# Patient Record
Sex: Male | Born: 1965 | Race: Black or African American | Hispanic: No | Marital: Single | Smoking: Former smoker
Health system: Southern US, Community
[De-identification: ages and names within clinical notes are randomized; demographics above are authoritative.]

## PROBLEM LIST (undated history)

## (undated) DIAGNOSIS — G4733 Obstructive sleep apnea (adult) (pediatric): Secondary | ICD-10-CM

## (undated) DIAGNOSIS — N189 Chronic kidney disease, unspecified: Secondary | ICD-10-CM

## (undated) DIAGNOSIS — E119 Type 2 diabetes mellitus without complications: Secondary | ICD-10-CM

## (undated) DIAGNOSIS — I340 Nonrheumatic mitral (valve) insufficiency: Secondary | ICD-10-CM

## (undated) DIAGNOSIS — I469 Cardiac arrest, cause unspecified: Secondary | ICD-10-CM

## (undated) DIAGNOSIS — I10 Essential (primary) hypertension: Secondary | ICD-10-CM

## (undated) DIAGNOSIS — K635 Polyp of colon: Secondary | ICD-10-CM

## (undated) DIAGNOSIS — I517 Cardiomegaly: Secondary | ICD-10-CM

## (undated) DIAGNOSIS — Z8709 Personal history of other diseases of the respiratory system: Secondary | ICD-10-CM

## (undated) DIAGNOSIS — E1129 Type 2 diabetes mellitus with other diabetic kidney complication: Secondary | ICD-10-CM

## (undated) DIAGNOSIS — E785 Hyperlipidemia, unspecified: Secondary | ICD-10-CM

## (undated) DIAGNOSIS — K5730 Diverticulosis of large intestine without perforation or abscess without bleeding: Secondary | ICD-10-CM

## (undated) DIAGNOSIS — Z973 Presence of spectacles and contact lenses: Secondary | ICD-10-CM

## (undated) DIAGNOSIS — T7840XA Allergy, unspecified, initial encounter: Secondary | ICD-10-CM

## (undated) DIAGNOSIS — R092 Respiratory arrest: Secondary | ICD-10-CM

## (undated) DIAGNOSIS — R778 Other specified abnormalities of plasma proteins: Secondary | ICD-10-CM

## (undated) DIAGNOSIS — R011 Cardiac murmur, unspecified: Secondary | ICD-10-CM

## (undated) DIAGNOSIS — I219 Acute myocardial infarction, unspecified: Secondary | ICD-10-CM

## (undated) DIAGNOSIS — G4730 Sleep apnea, unspecified: Secondary | ICD-10-CM

## (undated) DIAGNOSIS — I2510 Atherosclerotic heart disease of native coronary artery without angina pectoris: Secondary | ICD-10-CM

## (undated) DIAGNOSIS — Z992 Dependence on renal dialysis: Secondary | ICD-10-CM

## (undated) DIAGNOSIS — Z972 Presence of dental prosthetic device (complete) (partial): Secondary | ICD-10-CM

## (undated) HISTORY — PX: WISDOM TOOTH EXTRACTION: SHX21

## (undated) HISTORY — DX: Atherosclerotic heart disease of native coronary artery without angina pectoris: I25.10

## (undated) HISTORY — DX: Dependence on renal dialysis: Z99.2

## (undated) HISTORY — DX: Essential (primary) hypertension: I10

## (undated) HISTORY — PX: NECK SURGERY: SHX720

## (undated) HISTORY — DX: Type 2 diabetes mellitus without complications: E11.9

## (undated) HISTORY — DX: Chronic kidney disease, unspecified: N18.9

## (undated) HISTORY — DX: Obstructive sleep apnea (adult) (pediatric): G47.33

## (undated) HISTORY — DX: Allergy, unspecified, initial encounter: T78.40XA

## (undated) HISTORY — DX: Sleep apnea, unspecified: G47.30

## (undated) HISTORY — DX: Acute myocardial infarction, unspecified: I21.9

## (undated) HISTORY — DX: Hyperlipidemia, unspecified: E78.5

---
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
## 1999-04-13 ENCOUNTER — Emergency Department (HOSPITAL_COMMUNITY): Admission: EM | Admit: 1999-04-13 | Discharge: 1999-04-13 | Payer: Self-pay | Admitting: Emergency Medicine

## 2009-08-15 DIAGNOSIS — I2510 Atherosclerotic heart disease of native coronary artery without angina pectoris: Secondary | ICD-10-CM

## 2009-08-15 HISTORY — DX: Atherosclerotic heart disease of native coronary artery without angina pectoris: I25.10

## 2009-09-13 ENCOUNTER — Inpatient Hospital Stay (HOSPITAL_COMMUNITY): Admission: AC | Admit: 2009-09-13 | Discharge: 2009-09-18 | Payer: Self-pay

## 2009-09-13 ENCOUNTER — Ambulatory Visit: Payer: Self-pay | Admitting: Internal Medicine

## 2009-09-13 ENCOUNTER — Ambulatory Visit: Payer: Self-pay | Admitting: Cardiology

## 2009-09-14 ENCOUNTER — Encounter: Payer: Self-pay | Admitting: Cardiology

## 2009-09-15 ENCOUNTER — Encounter: Payer: Self-pay | Admitting: Cardiovascular Disease

## 2009-10-18 ENCOUNTER — Telehealth: Payer: Self-pay | Admitting: Internal Medicine

## 2009-10-27 ENCOUNTER — Telehealth: Payer: Self-pay | Admitting: Internal Medicine

## 2009-11-01 DIAGNOSIS — I219 Acute myocardial infarction, unspecified: Secondary | ICD-10-CM | POA: Insufficient documentation

## 2009-11-01 DIAGNOSIS — I469 Cardiac arrest, cause unspecified: Secondary | ICD-10-CM | POA: Insufficient documentation

## 2009-11-01 DIAGNOSIS — I10 Essential (primary) hypertension: Secondary | ICD-10-CM | POA: Insufficient documentation

## 2009-11-01 DIAGNOSIS — I2510 Atherosclerotic heart disease of native coronary artery without angina pectoris: Secondary | ICD-10-CM

## 2009-11-01 HISTORY — DX: Cardiac arrest, cause unspecified: I46.9

## 2009-11-01 HISTORY — DX: Essential (primary) hypertension: I10

## 2009-11-01 HISTORY — DX: Atherosclerotic heart disease of native coronary artery without angina pectoris: I25.10

## 2009-11-02 ENCOUNTER — Ambulatory Visit: Payer: Self-pay | Admitting: Internal Medicine

## 2009-11-02 ENCOUNTER — Encounter: Payer: Self-pay | Admitting: Cardiology

## 2010-02-20 ENCOUNTER — Telehealth (INDEPENDENT_AMBULATORY_CARE_PROVIDER_SITE_OTHER): Payer: Self-pay | Admitting: *Deleted

## 2010-06-17 DIAGNOSIS — G4733 Obstructive sleep apnea (adult) (pediatric): Secondary | ICD-10-CM

## 2010-06-17 HISTORY — DX: Obstructive sleep apnea (adult) (pediatric): G47.33

## 2010-07-17 NOTE — Assessment & Plan Note (Signed)
Summary: eph per Troy Wells call/lg   Visit Type:  Initial Consult Primary Provider:  Orlean Patten, MD   History of Present Illness: [name] returns today for followup.  He is a pleasant 45 yo man with a NSTEMI which was complicated by VF arrest for which he was successfully rescucitated.  The patient had done well since discharge except that his blood pressure remains elevated.  He wants to return to work.  He works as a Games developer.  Current Medications (verified): 1)  Plavix 75 Mg Tabs (Clopidogrel Bisulfate) .... Take One Tablet By Mouth Daily 2)  Aspirin 81 Mg Tbec (Aspirin) .... Take One Tablet By Mouth Daily 3)  Hydralazine Hcl 10 Mg Tabs (Hydralazine Hcl) .... Take One Tablet By Mouth Two Times A Day 4)  Metoprolol Succinate 100 Mg Xr24h-Tab (Metoprolol Succinate) .... Take One Tablet By Mouth Daily 5)  Lovastatin 20 Mg Tabs (Lovastatin) .... Take One Tablet By Mouth Daily At Bedtime 6)  Lisinopril 40 Mg Tabs (Lisinopril) .... Take One Tablet By Mouth Daily 7)  Glipizide 5 Mg Xr24h-Tab (Glipizide) .... Take One Tablet By Mouth Once Daily. 8)  Colcrys 0.6 Mg Tabs (Colchicine) .... As Needed 9)  Allopurinol 100 Mg Tabs (Allopurinol) .... 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    Vital Signs:  Patient profile:   44 year old male Height:      72 inches Weight:      227 pounds BMI:     30.90 Pulse rate:   75 / minute Pulse rhythm:   regular BP sitting:   172 / 102  (left arm)  Vitals Entered By: Margaretmary Bayley CMA (Nov 02, 2009 9:24  AM)  Physical Exam  General:  Well developed, well nourished, in no acute distress.  HEENT: normal Neck: supple. No JVD. Carotids 2+ bilaterally no bruits Cor: RRR no rubs. A soft  S4 is present. Lungs: CTA Ab: soft, nontender. nondistended. No HSM. Good bowel sounds Ext: warm. no cyanosis, clubbing or edema Neuro: alert and oriented. Grossly nonfocal. affect pleasant    EKG  Procedure date:  11/02/2009  Findings:      Normal sinus rhythm with rate of:  75.  Impression & Recommendations:  Problem # 1:  MI (ICD-410.90) He has no additional anginal symptoms.  He will continue his current meds. His updated medication list for this problem includes:    Plavix 75 Mg Tabs (Clopidogrel bisulfate) .Marland Kitchen... Take one tablet by mouth daily    Aspirin 81 Mg Tbec (Aspirin) .Marland Kitchen... Take one tablet by mouth daily    Metoprolol Succinate 100 Mg Xr24h-tab (Metoprolol succinate) .Marland Kitchen... Take one tablet by mouth daily    Lisinopril 40 Mg Tabs (Lisinopril) .Marland Kitchen... Take one tablet by mouth daily  Orders: EKG w/ Interpretation (93000) EKG w/ Interpretation (93000)  Problem # 2:  HYPERTENSION (ICD-401.9) His blood pressure is not well controlled.  He admits to sodium indiscretion.  I have asked him to walk/perform aerobic exercise and reduce his sodium intake.  He is going to have an u/s of his kidneys.   His updated medication list for this problem includes:  Aspirin 81 Mg Tbec (Aspirin) .Marland Kitchen... Take one tablet by mouth daily    Hydralazine Hcl 10 Mg Tabs (Hydralazine hcl) .Marland Kitchen... Take one tablet by mouth two times a day    Metoprolol Succinate 100 Mg Xr24h-tab (Metoprolol succinate) .Marland Kitchen... Take one tablet by mouth daily    Lisinopril 40 Mg Tabs (Lisinopril) .Marland Kitchen... Take one tablet by mouth daily  Orders: EKG w/ Interpretation (93000)  Patient Instructions: 1)  Your physician recommends that you schedule a follow-up appointment in: 4 months with Dr. Lovena Le 2)  Your physician recommends that you continue  on your current medications as directed. Please refer to the Current Medication list given to you today.

## 2010-07-17 NOTE — Progress Notes (Signed)
  ROI mailed out to Pt. Troy Wells  February 20, 2010 9:03 AM     Appended Document:  Recieved ROI back Via Fax, Faxed records over to [facility] @ [phone]

## 2010-07-17 NOTE — Progress Notes (Signed)
Summary:  Need samples of Plavix  Phone Note Call from Patient Call back at Home Phone [phone]   Caller: Patient Summary of Call: Pt calling need samples of Plavix Initial call taken by: Delsa Sale,  Oct 18, 2009 11:34 AM  Follow-up for Phone Call        Contacted pt to let him know that his samples willbe waiting for him at the front desk. Follow-up by: Margaretmary Bayley CMA,  Oct 18, 2009 4:19 PM

## 2010-07-17 NOTE — Letter (Signed)
Summary: Return To Work  [place] [place]  [address] [place]   [place], [place] [address]   Phone: [phone]  Fax: [phone]    11/02/2009  TO: Dorathy Daft IT MAY CONCERN   RE: Troy Wells [address] [place]   The above named individual is under my medical care and may return to work.  He may not drive at this time.  If you have any further questions or need additional information, please call.     Sincerely,    Joelyn Oms RN/ Dr. Lovena Le

## 2010-07-17 NOTE — Progress Notes (Signed)
Summary: pt need plavix samples  Phone Note Refill Request Call back at Home Phone [phone] Message from:  Patient  Refills Requested: Medication #1:  plavix samples Initial call taken by: Shelda Pal,  Oct 27, 2009 9:45 AM  Follow-up for Phone Call        [facility] letting patien know that there are samples for him at the front desk. 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 Follow-up by: Margaretmary Bayley CMA,  Oct 27, 2009 3:39 PM

## 2010-09-05 LAB — CBC
HCT: 31.1 % — ABNORMAL LOW (ref 39.0–52.0)
HCT: 32 % — ABNORMAL LOW (ref 39.0–52.0)
HCT: 33.2 % — ABNORMAL LOW (ref 39.0–52.0)
Hemoglobin: 10.6 g/dL — ABNORMAL LOW (ref 13.0–17.0)
Hemoglobin: 10.9 g/dL — ABNORMAL LOW (ref 13.0–17.0)
Hemoglobin: 11 g/dL — ABNORMAL LOW (ref 13.0–17.0)
MCHC: 33.2 g/dL (ref 30.0–36.0)
MCHC: 33.9 g/dL (ref 30.0–36.0)
MCHC: 34.1 g/dL (ref 30.0–36.0)
MCV: 84.8 fL (ref 78.0–100.0)
MCV: 85.5 fL (ref 78.0–100.0)
MCV: 85.9 fL (ref 78.0–100.0)
Platelets: 257 10*3/uL (ref 150–400)
Platelets: 277 10*3/uL (ref 150–400)
Platelets: 312 K/uL (ref 150–400)
RBC: 3.62 MIL/uL — ABNORMAL LOW (ref 4.22–5.81)
RBC: 3.77 MIL/uL — ABNORMAL LOW (ref 4.22–5.81)
RBC: 3.89 MIL/uL — ABNORMAL LOW (ref 4.22–5.81)
RDW: 13.2 % (ref 11.5–15.5)
RDW: 13.4 % (ref 11.5–15.5)
RDW: 13.5 % (ref 11.5–15.5)
WBC: 12.6 K/uL — ABNORMAL HIGH (ref 4.0–10.5)
WBC: 12.7 10*3/uL — ABNORMAL HIGH (ref 4.0–10.5)
WBC: 13.5 10*3/uL — ABNORMAL HIGH (ref 4.0–10.5)

## 2010-09-05 LAB — URINALYSIS, ROUTINE W REFLEX MICROSCOPIC
Bilirubin Urine: NEGATIVE
Glucose, UA: NEGATIVE mg/dL
Ketones, ur: NEGATIVE mg/dL
Nitrite: NEGATIVE
Protein, ur: 100 mg/dL — AB
Specific Gravity, Urine: 1.016 (ref 1.005–1.030)
Urobilinogen, UA: 1 mg/dL (ref 0.0–1.0)
pH: 5.5 (ref 5.0–8.0)

## 2010-09-05 LAB — GLUCOSE, CAPILLARY
Glucose-Capillary: 104 mg/dL — ABNORMAL HIGH (ref 70–99)
Glucose-Capillary: 106 mg/dL — ABNORMAL HIGH (ref 70–99)
Glucose-Capillary: 116 mg/dL — ABNORMAL HIGH (ref 70–99)
Glucose-Capillary: 117 mg/dL — ABNORMAL HIGH (ref 70–99)
Glucose-Capillary: 124 mg/dL — ABNORMAL HIGH (ref 70–99)
Glucose-Capillary: 126 mg/dL — ABNORMAL HIGH (ref 70–99)
Glucose-Capillary: 136 mg/dL — ABNORMAL HIGH (ref 70–99)
Glucose-Capillary: 144 mg/dL — ABNORMAL HIGH (ref 70–99)
Glucose-Capillary: 177 mg/dL — ABNORMAL HIGH (ref 70–99)

## 2010-09-05 LAB — BASIC METABOLIC PANEL
BUN: 25 mg/dL — ABNORMAL HIGH (ref 6–23)
BUN: 28 mg/dL — ABNORMAL HIGH (ref 6–23)
CO2: 24 mEq/L (ref 19–32)
CO2: 25 mEq/L (ref 19–32)
Calcium: 8.8 mg/dL (ref 8.4–10.5)
Calcium: 9.2 mg/dL (ref 8.4–10.5)
Chloride: 102 mEq/L (ref 96–112)
Chloride: 103 mEq/L (ref 96–112)
Creatinine, Ser: 1.87 mg/dL — ABNORMAL HIGH (ref 0.4–1.5)
Creatinine, Ser: 1.96 mg/dL — ABNORMAL HIGH (ref 0.4–1.5)
GFR calc Af Amer: 45 mL/min — ABNORMAL LOW (ref >60)
GFR calc Af Amer: 48 mL/min — ABNORMAL LOW (ref >60)
GFR calc non Af Amer: 37 mL/min — ABNORMAL LOW (ref >60)
GFR calc non Af Amer: 39 mL/min — ABNORMAL LOW (ref >60)
Glucose, Bld: 112 mg/dL — ABNORMAL HIGH (ref 70–99)
Glucose, Bld: 113 mg/dL — ABNORMAL HIGH (ref 70–99)
Potassium: 4 mEq/L (ref 3.5–5.1)
Potassium: 4.4 mEq/L (ref 3.5–5.1)
Sodium: 136 mEq/L (ref 135–145)
Sodium: 137 mEq/L (ref 135–145)

## 2010-09-05 LAB — BASIC METABOLIC PANEL WITH GFR
BUN: 21 mg/dL (ref 6–23)
BUN: 21 mg/dL (ref 6–23)
CO2: 24 meq/L (ref 19–32)
CO2: 24 meq/L (ref 19–32)
Calcium: 8.7 mg/dL (ref 8.4–10.5)
Calcium: 9 mg/dL (ref 8.4–10.5)
Chloride: 104 meq/L (ref 96–112)
Chloride: 104 meq/L (ref 96–112)
Creatinine, Ser: 1.83 mg/dL — ABNORMAL HIGH (ref 0.4–1.5)
Creatinine, Ser: 1.95 mg/dL — ABNORMAL HIGH (ref 0.4–1.5)
GFR calc non Af Amer: 38 mL/min — ABNORMAL LOW
GFR calc non Af Amer: 40 mL/min — ABNORMAL LOW
Glucose, Bld: 110 mg/dL — ABNORMAL HIGH (ref 70–99)
Glucose, Bld: 97 mg/dL (ref 70–99)
Potassium: 3.6 meq/L (ref 3.5–5.1)
Potassium: 3.8 meq/L (ref 3.5–5.1)
Sodium: 134 meq/L — ABNORMAL LOW (ref 135–145)
Sodium: 137 meq/L (ref 135–145)

## 2010-09-05 LAB — URINE MICROSCOPIC-ADD ON

## 2010-09-05 LAB — URIC ACID: Uric Acid, Serum: 9.1 mg/dL — ABNORMAL HIGH (ref 4.0–7.8)

## 2010-09-05 LAB — MRSA PCR SCREENING: MRSA by PCR: NEGATIVE

## 2010-09-05 LAB — HEPARIN LEVEL (UNFRACTIONATED)

## 2010-09-10 LAB — GLUCOSE, CAPILLARY
Glucose-Capillary: 123 mg/dL — ABNORMAL HIGH (ref 70–99)
Glucose-Capillary: 130 mg/dL — ABNORMAL HIGH (ref 70–99)
Glucose-Capillary: 143 mg/dL — ABNORMAL HIGH (ref 70–99)
Glucose-Capillary: 144 mg/dL — ABNORMAL HIGH (ref 70–99)
Glucose-Capillary: 152 mg/dL — ABNORMAL HIGH (ref 70–99)
Glucose-Capillary: 160 mg/dL — ABNORMAL HIGH (ref 70–99)
Glucose-Capillary: 191 mg/dL — ABNORMAL HIGH (ref 70–99)

## 2010-09-10 LAB — CBC
HCT: 34.9 % — ABNORMAL LOW (ref 39.0–52.0)
HCT: 38.1 % — ABNORMAL LOW (ref 39.0–52.0)
Hemoglobin: 11.7 g/dL — ABNORMAL LOW (ref 13.0–17.0)
Hemoglobin: 12.7 g/dL — ABNORMAL LOW (ref 13.0–17.0)
MCHC: 33.4 g/dL (ref 30.0–36.0)
MCHC: 33.5 g/dL (ref 30.0–36.0)
MCV: 85.7 fL (ref 78.0–100.0)
MCV: 86.7 fL (ref 78.0–100.0)
Platelets: 288 10*3/uL (ref 150–400)
Platelets: 327 10*3/uL (ref 150–400)
RBC: 4.03 MIL/uL — ABNORMAL LOW (ref 4.22–5.81)
RBC: 4.44 MIL/uL (ref 4.22–5.81)
RDW: 13.3 % (ref 11.5–15.5)
RDW: 13.5 % (ref 11.5–15.5)
WBC: 11.7 10*3/uL — ABNORMAL HIGH (ref 4.0–10.5)
WBC: 12.2 10*3/uL — ABNORMAL HIGH (ref 4.0–10.5)

## 2010-09-10 LAB — COMPREHENSIVE METABOLIC PANEL
ALT: 285 U/L — ABNORMAL HIGH (ref 0–53)
ALT: 334 U/L — ABNORMAL HIGH (ref 0–53)
ALT: 359 U/L — ABNORMAL HIGH (ref 0–53)
AST: 200 U/L — ABNORMAL HIGH (ref 0–37)
AST: 251 U/L — ABNORMAL HIGH (ref 0–37)
AST: 274 U/L — ABNORMAL HIGH (ref 0–37)
Albumin: 3.5 g/dL (ref 3.5–5.2)
Albumin: 3.9 g/dL (ref 3.5–5.2)
Albumin: 3.9 g/dL (ref 3.5–5.2)
Alkaline Phosphatase: 62 U/L (ref 39–117)
Alkaline Phosphatase: 67 U/L (ref 39–117)
Alkaline Phosphatase: 69 U/L (ref 39–117)
BUN: 28 mg/dL — ABNORMAL HIGH (ref 6–23)
BUN: 29 mg/dL — ABNORMAL HIGH (ref 6–23)
BUN: 29 mg/dL — ABNORMAL HIGH (ref 6–23)
CO2: 19 mEq/L (ref 19–32)
CO2: 20 mEq/L (ref 19–32)
CO2: 24 mEq/L (ref 19–32)
Calcium: 8.6 mg/dL (ref 8.4–10.5)
Calcium: 8.7 mg/dL (ref 8.4–10.5)
Calcium: 9 mg/dL (ref 8.4–10.5)
Chloride: 102 mEq/L (ref 96–112)
Chloride: 103 mEq/L (ref 96–112)
Chloride: 108 mEq/L (ref 96–112)
Creatinine, Ser: 1.8 mg/dL — ABNORMAL HIGH (ref 0.4–1.5)
Creatinine, Ser: 1.96 mg/dL — ABNORMAL HIGH (ref 0.4–1.5)
Creatinine, Ser: 2.08 mg/dL — ABNORMAL HIGH (ref 0.4–1.5)
GFR calc Af Amer: 42 mL/min — ABNORMAL LOW (ref >60)
GFR calc Af Amer: 45 mL/min — ABNORMAL LOW (ref >60)
GFR calc Af Amer: 50 mL/min — ABNORMAL LOW (ref >60)
GFR calc non Af Amer: 35 mL/min — ABNORMAL LOW (ref >60)
GFR calc non Af Amer: 37 mL/min — ABNORMAL LOW (ref >60)
GFR calc non Af Amer: 41 mL/min — ABNORMAL LOW (ref >60)
Glucose, Bld: 106 mg/dL — ABNORMAL HIGH (ref 70–99)
Glucose, Bld: 129 mg/dL — ABNORMAL HIGH (ref 70–99)
Glucose, Bld: 206 mg/dL — ABNORMAL HIGH (ref 70–99)
Potassium: 3.6 mEq/L (ref 3.5–5.1)
Potassium: 3.6 mEq/L (ref 3.5–5.1)
Potassium: 3.9 mEq/L (ref 3.5–5.1)
Sodium: 135 mEq/L (ref 135–145)
Sodium: 136 mEq/L (ref 135–145)
Sodium: 138 mEq/L (ref 135–145)
Total Bilirubin: 0.5 mg/dL (ref 0.3–1.2)
Total Bilirubin: 0.6 mg/dL (ref 0.3–1.2)
Total Bilirubin: 0.7 mg/dL (ref 0.3–1.2)
Total Protein: 6.5 g/dL (ref 6.0–8.3)
Total Protein: 7.1 g/dL (ref 6.0–8.3)
Total Protein: 7.3 g/dL (ref 6.0–8.3)

## 2010-09-10 LAB — POCT I-STAT, CHEM 8
BUN: 32 mg/dL — ABNORMAL HIGH (ref 6–23)
Calcium, Ion: 1.02 mmol/L — ABNORMAL LOW (ref 1.12–1.32)
Chloride: 109 mEq/L (ref 96–112)
Creatinine, Ser: 2.1 mg/dL — ABNORMAL HIGH (ref 0.4–1.5)
Glucose, Bld: 203 mg/dL — ABNORMAL HIGH (ref 70–99)
HCT: 41 % (ref 39.0–52.0)
Hemoglobin: 13.9 g/dL (ref 13.0–17.0)
Potassium: 4.2 meq/L (ref 3.5–5.1)
Sodium: 135 mEq/L (ref 135–145)
TCO2: 18 mmol/L (ref 0–100)

## 2010-09-10 LAB — CARDIAC PANEL(CRET KIN+CKTOT+MB+TROPI)
CK, MB: 105.4 ng/mL (ref 0.3–4.0)
CK, MB: 213.2 ng/mL (ref 0.3–4.0)
Relative Index: 7.1 — ABNORMAL HIGH (ref 0.0–2.5)
Relative Index: 9.4 — ABNORMAL HIGH (ref 0.0–2.5)
Total CK: 1489 U/L — ABNORMAL HIGH (ref 7–232)
Total CK: 2271 U/L — ABNORMAL HIGH (ref 7–232)
Troponin I: 18.57 ng/mL (ref 0.00–0.06)
Troponin I: 5.76 ng/mL (ref 0.00–0.06)

## 2010-09-10 LAB — GLYCOHEMOGLOBIN, TOTAL: Hemoglobin-A1c: 8.5 % — ABNORMAL HIGH (ref 5.4–7.4)

## 2010-09-10 LAB — DRUGS OF ABUSE SCREEN W/O ALC, ROUTINE URINE
Amphetamine Screen, Ur: NEGATIVE
Barbiturate Quant, Ur: NEGATIVE
Benzodiazepines.: POSITIVE — AB
Cocaine Metabolites: NEGATIVE
Creatinine,U: 102.9 mg/dL
Marijuana Metabolite: NEGATIVE
Methadone: NEGATIVE
Opiate Screen, Urine: NEGATIVE
Phencyclidine (PCP): NEGATIVE
Propoxyphene: NEGATIVE

## 2010-09-10 LAB — TYPE AND SCREEN
ABO/RH(D): O POS
Antibody Screen: NEGATIVE

## 2010-09-10 LAB — POCT CARDIAC MARKERS
CKMB, poc: 39.6 ng/mL (ref 1.0–8.0)
Myoglobin, poc: >500 ng/mL (ref 12–200)
Troponin i, poc: 0.8 ng/mL (ref 0.00–0.09)

## 2010-09-10 LAB — BENZODIAZEPINE, QUANTITATIVE, URINE
Alprazolam (GC/LC/MS), ur confirm: NEGATIVE NG/ML
Nordiazepam GC/MS Conf: NEGATIVE NG/ML
Oxazepam GC/MS Conf: NEGATIVE NG/ML
Temazepam GC/MS Conf: NEGATIVE NG/ML

## 2010-09-10 LAB — POCT I-STAT 3, ART BLOOD GAS (G3+)
Bicarbonate: 24.5 mEq/L — ABNORMAL HIGH (ref 20.0–24.0)
Bicarbonate: 26 mEq/L — ABNORMAL HIGH (ref 20.0–24.0)
O2 Saturation: 100 %
O2 Saturation: 72 %
Patient temperature: 97.5
Patient temperature: 97.5
TCO2: 26 mmol/L (ref 0–100)
TCO2: 27 mmol/L (ref 0–100)
pCO2 arterial: 36.7 mmHg (ref 35.0–45.0)
pCO2 arterial: 45.8 mmHg — ABNORMAL HIGH (ref 35.0–45.0)
pH, Arterial: 7.36 (ref 7.350–7.450)
pH, Arterial: 7.43 (ref 7.350–7.450)
pO2, Arterial: 335 mmHg — ABNORMAL HIGH (ref 80.0–100.0)
pO2, Arterial: 39 mmHg — CL (ref 80.0–100.0)

## 2010-09-10 LAB — HEPARIN LEVEL (UNFRACTIONATED)
Heparin Unfractionated: 0.21 IU/mL — ABNORMAL LOW (ref 0.30–0.70)
Heparin Unfractionated: 0.25 IU/mL — ABNORMAL LOW (ref 0.30–0.70)

## 2010-09-10 LAB — CK TOTAL AND CKMB (NOT AT ARMC)
CK, MB: 49.9 ng/mL (ref 0.3–4.0)
Relative Index: 5.3 — ABNORMAL HIGH (ref 0.0–2.5)
Total CK: 935 U/L — ABNORMAL HIGH (ref 7–232)

## 2010-09-10 LAB — MRSA PCR SCREENING: MRSA by PCR: NEGATIVE

## 2010-09-10 LAB — APTT: aPTT: 25 seconds (ref 24–37)

## 2010-09-10 LAB — MAGNESIUM: Magnesium: 1.6 mg/dL (ref 1.5–2.5)

## 2010-09-10 LAB — ABO/RH: ABO/RH(D): O POS

## 2010-09-10 LAB — ETHANOL: Alcohol, Ethyl (B): <5 mg/dL (ref 0–10)

## 2010-09-10 LAB — TROPONIN I: Troponin I: 2.14 ng/mL (ref 0.00–0.06)

## 2010-09-10 LAB — LACTIC ACID, PLASMA: Lactic Acid, Venous: 5.9 mmol/L — ABNORMAL HIGH (ref 0.5–2.2)

## 2010-09-10 LAB — BRAIN NATRIURETIC PEPTIDE
Pro B Natriuretic peptide (BNP): <30 pg/mL (ref 0.0–100.0)
Pro B Natriuretic peptide (BNP): <30 pg/mL (ref 0.0–100.0)

## 2010-09-10 LAB — PROTIME-INR
INR: 1.08 (ref 0.00–1.49)
Prothrombin Time: 13.9 seconds (ref 11.6–15.2)

## 2013-12-12 ENCOUNTER — Ambulatory Visit (INDEPENDENT_AMBULATORY_CARE_PROVIDER_SITE_OTHER): Payer: Self-pay | Admitting: Family Medicine

## 2013-12-12 VITALS — BP 146/80 | HR 72 | Temp 98.0°F | Resp 16 | Ht 70.0 in | Wt 246.0 lb

## 2013-12-12 DIAGNOSIS — G4733 Obstructive sleep apnea (adult) (pediatric): Secondary | ICD-10-CM

## 2013-12-12 DIAGNOSIS — N183 Chronic kidney disease, stage 3 unspecified: Secondary | ICD-10-CM

## 2013-12-12 DIAGNOSIS — I10 Essential (primary) hypertension: Secondary | ICD-10-CM

## 2013-12-12 DIAGNOSIS — I2510 Atherosclerotic heart disease of native coronary artery without angina pectoris: Secondary | ICD-10-CM

## 2013-12-12 DIAGNOSIS — E119 Type 2 diabetes mellitus without complications: Secondary | ICD-10-CM

## 2013-12-12 DIAGNOSIS — Z0289 Encounter for other administrative examinations: Secondary | ICD-10-CM

## 2013-12-12 LAB — GLUCOSE, POCT (MANUAL RESULT ENTRY): POC Glucose: 91 mg/dl (ref 70–99)

## 2013-12-12 LAB — POCT GLYCOSYLATED HEMOGLOBIN (HGB A1C): Hemoglobin A1C: 5.9
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
## 2013-12-13 ENCOUNTER — Other Ambulatory Visit (HOSPITAL_COMMUNITY): Payer: Self-pay | Admitting: *Deleted

## 2013-12-13 ENCOUNTER — Other Ambulatory Visit (HOSPITAL_COMMUNITY): Payer: Self-pay | Admitting: Family Medicine

## 2013-12-13 DIAGNOSIS — I25810 Atherosclerosis of coronary artery bypass graft(s) without angina pectoris: Secondary | ICD-10-CM

## 2013-12-14 ENCOUNTER — Other Ambulatory Visit: Payer: Self-pay | Admitting: Family Medicine

## 2013-12-14 ENCOUNTER — Telehealth (HOSPITAL_COMMUNITY): Payer: Self-pay

## 2013-12-14 DIAGNOSIS — I2510 Atherosclerotic heart disease of native coronary artery without angina pectoris: Secondary | ICD-10-CM

## 2013-12-14 DIAGNOSIS — I5022 Chronic systolic (congestive) heart failure: Secondary | ICD-10-CM

## 2013-12-15 ENCOUNTER — Telehealth (HOSPITAL_COMMUNITY): Payer: Self-pay | Admitting: *Deleted

## 2013-12-16 ENCOUNTER — Encounter (HOSPITAL_COMMUNITY): Payer: Self-pay

## 2013-12-16 ENCOUNTER — Inpatient Hospital Stay (HOSPITAL_COMMUNITY): Admission: RE | Admit: 2013-12-16 | Payer: Self-pay | Source: Ambulatory Visit

## 2013-12-20 ENCOUNTER — Telehealth (HOSPITAL_COMMUNITY): Payer: Self-pay | Admitting: *Deleted

## 2013-12-30 NOTE — Telephone Encounter (Signed)
Encounter complete. 

## 2014-09-27 DIAGNOSIS — Z992 Dependence on renal dialysis: Secondary | ICD-10-CM

## 2014-09-27 HISTORY — DX: Dependence on renal dialysis: Z99.2

## 2014-09-27 HISTORY — PX: AV FISTULA PLACEMENT: SHX1204

## 2014-10-27 ENCOUNTER — Emergency Department (HOSPITAL_COMMUNITY): Payer: Medicaid Other

## 2014-10-27 ENCOUNTER — Emergency Department (HOSPITAL_COMMUNITY)
Admission: EM | Admit: 2014-10-27 | Discharge: 2014-10-27 | Disposition: A | Discharge status: Home / Self Care | Payer: Medicaid Other | Attending: Emergency Medicine | Admitting: Emergency Medicine

## 2014-10-27 ENCOUNTER — Encounter (HOSPITAL_COMMUNITY): Payer: Self-pay | Admitting: Emergency Medicine

## 2014-10-27 DIAGNOSIS — Z79899 Other long term (current) drug therapy: Secondary | ICD-10-CM | POA: Insufficient documentation

## 2014-10-27 DIAGNOSIS — N189 Chronic kidney disease, unspecified: Secondary | ICD-10-CM | POA: Diagnosis not present

## 2014-10-27 DIAGNOSIS — M25562 Pain in left knee: Secondary | ICD-10-CM | POA: Diagnosis present

## 2014-10-27 DIAGNOSIS — Z9981 Dependence on supplemental oxygen: Secondary | ICD-10-CM | POA: Insufficient documentation

## 2014-10-27 DIAGNOSIS — Z87891 Personal history of nicotine dependence: Secondary | ICD-10-CM | POA: Diagnosis not present

## 2014-10-27 DIAGNOSIS — M1A3620 Chronic gout due to renal impairment, left knee, without tophus (tophi): Secondary | ICD-10-CM | POA: Insufficient documentation

## 2014-10-27 DIAGNOSIS — G4733 Obstructive sleep apnea (adult) (pediatric): Secondary | ICD-10-CM | POA: Insufficient documentation

## 2014-10-27 DIAGNOSIS — M25462 Effusion, left knee: Secondary | ICD-10-CM | POA: Diagnosis not present

## 2014-10-27 DIAGNOSIS — I252 Old myocardial infarction: Secondary | ICD-10-CM | POA: Insufficient documentation

## 2014-10-27 DIAGNOSIS — I2510 Atherosclerotic heart disease of native coronary artery without angina pectoris: Secondary | ICD-10-CM | POA: Diagnosis not present

## 2014-10-27 DIAGNOSIS — E119 Type 2 diabetes mellitus without complications: Secondary | ICD-10-CM | POA: Diagnosis not present

## 2014-10-27 DIAGNOSIS — I129 Hypertensive chronic kidney disease with stage 1 through stage 4 chronic kidney disease, or unspecified chronic kidney disease: Secondary | ICD-10-CM | POA: Diagnosis not present

## 2014-10-27 DIAGNOSIS — M25569 Pain in unspecified knee: Secondary | ICD-10-CM

## 2014-10-27 DIAGNOSIS — Z7982 Long term (current) use of aspirin: Secondary | ICD-10-CM | POA: Insufficient documentation

## 2014-10-27 LAB — BASIC METABOLIC PANEL
Anion gap: 11 (ref 5–15)
BUN: 28 mg/dL — ABNORMAL HIGH (ref 6–20)
CO2: 30 mmol/L (ref 22–32)
Calcium: 9.4 mg/dL (ref 8.9–10.3)
Chloride: 100 mmol/L — ABNORMAL LOW (ref 101–111)
Creatinine, Ser: 6.6 mg/dL — ABNORMAL HIGH (ref 0.61–1.24)
GFR calc Af Amer: 10 mL/min — ABNORMAL LOW (ref >60)
GFR calc non Af Amer: 9 mL/min — ABNORMAL LOW (ref >60)
Glucose, Bld: 126 mg/dL — ABNORMAL HIGH (ref 65–99)
Potassium: 4.3 mmol/L (ref 3.5–5.1)
Sodium: 141 mmol/L (ref 135–145)

## 2014-10-27 LAB — SYNOVIAL CELL COUNT + DIFF, W/ CRYSTALS
Eosinophils-Synovial: 0 % (ref 0–1)
Lymphocytes-Synovial Fld: 4 % (ref 0–20)
Monocyte-Macrophage-Synovial Fluid: 14 % — ABNORMAL LOW (ref 50–90)
Neutrophil, Synovial: 82 % — ABNORMAL HIGH (ref 0–25)
WBC, Synovial: 14895 /mm3 — ABNORMAL HIGH (ref 0–200)

## 2014-10-27 LAB — CBC
HCT: 26.6 % — ABNORMAL LOW (ref 39.0–52.0)
Hemoglobin: 8.6 g/dL — ABNORMAL LOW (ref 13.0–17.0)
MCH: 25.2 pg — ABNORMAL LOW (ref 26.0–34.0)
MCHC: 32.3 g/dL (ref 30.0–36.0)
MCV: 78 fL (ref 78.0–100.0)
Platelets: 294 10*3/uL (ref 150–400)
RBC: 3.41 MIL/uL — ABNORMAL LOW (ref 4.22–5.81)
RDW: 14.2 % (ref 11.5–15.5)
WBC: 7 10*3/uL (ref 4.0–10.5)

## 2014-10-27 LAB — URIC ACID: Uric Acid, Serum: 3.7 mg/dL — ABNORMAL LOW (ref 4.4–7.6)

## 2014-10-27 LAB — GRAM STAIN

## 2014-10-27 MED ORDER — HYDROCODONE-ACETAMINOPHEN 5-325 MG PO TABS: 1.0000 | ORAL_TABLET | Freq: Four times a day (QID) | ORAL | Status: DC | PRN

## 2014-10-27 MED ORDER — LIDOCAINE HCL 2 % IJ SOLN
10.0000 mL | Freq: Once | INTRAMUSCULAR | Status: AC
  Administered 2014-10-27: 200 mg via INTRADERMAL
  Filled 2014-10-27: qty 20

## 2014-10-27 MED ORDER — HYDROCODONE-ACETAMINOPHEN 5-325 MG PO TABS
2.0000 | ORAL_TABLET | Freq: Once | ORAL | Status: AC
  Administered 2014-10-27: 2 via ORAL
  Filled 2014-10-27: qty 2

## 2014-10-27 MED ORDER — PREDNISONE 20 MG PO TABS: ORAL_TABLET | ORAL | Status: DC

## 2014-10-27 NOTE — Discharge Instructions (Signed)

## 2014-10-27 NOTE — ED Provider Notes (Signed)
CSN: [id number]     Arrival date & time 10/27/14  0908 History   First MD Initiated Contact with Patient 10/27/14 0914     Chief Complaint  Patient presents with  . Knee Pain     (Consider location/radiation/quality/duration/timing/severity/associated sxs/prior Treatment) Patient is a 49 y.o. male presenting with knee pain. The history is provided by the patient.  Knee Pain Location:  Knee Time since incident:  2 weeks Injury: no   Knee location:  L knee Pain details:    Quality:  Aching   Severity:  Moderate   Onset quality:  Gradual   Duration:  2 weeks   Timing:  Constant   Progression:  Unchanged Chronicity:  New Dislocation: no   Prior injury to area:  No Relieved by:  Nothing Worsened by:  Bearing weight Associated symptoms: no back pain, no fever, no neck pain, no swelling and no tingling     Past Medical History  Diagnosis Date  . Diabetes mellitus without complication   . Hypertension   . Chronic kidney disease     Chronic Renal Insufficiency  . Myocardial infarction   . Coronary artery disease 08/15/2009    with AMI  . OSA treated with BiPAP 06/17/2010   No past surgical history on file. Family History  Problem Relation Age of Onset  . Hypertension Mother   . Heart disease Mother 75    cause of death  . Hypertension Father   . Aneurysm Father 66    brain aneurysm  . Heart disease Father 68    cause of death  . Hypertension Brother   . Alcohol abuse Brother    History  Substance Use Topics  . Smoking status: Former Smoker -- 1.00 packs/day for 10 years    Types: Cigars    Quit date: 06/18/2011  . Smokeless tobacco: Never Used  . Alcohol Use: 1.2 oz/week    2 Cans of beer per week     Comment: socially    Review of Systems  Constitutional: Negative for fever and chills.  Respiratory: Negative for cough and shortness of breath.   Gastrointestinal: Negative for vomiting and abdominal pain.  Musculoskeletal: Negative for back pain and neck pain.   All other systems reviewed and are negative.     Allergies  Review of patient's allergies indicates no known allergies.  Home Medications   Prior to Admission medications   Medication Sig Start Date End Date Taking? Authorizing Provider  aspirin 81 MG tablet Take 81 mg by mouth daily.    Historical Provider, MD  glipiZIDE (GLUCOTROL) 10 MG tablet Take 10 mg by mouth 2 (two) times daily before a meal.    Historical Provider, MD  hydrochlorothiazide (HYDRODIURIL) 25 MG tablet Take 25 mg by mouth daily.    Historical Provider, MD  lisinopril (PRINIVIL,ZESTRIL) 20 MG tablet Take 20 mg by mouth daily.    Historical Provider, MD  NIFEdipine (PROCARDIA XL/ADALAT-CC) 90 MG 24 hr tablet Take 90 mg by mouth daily.    Historical Provider, MD   BP 176/83 mmHg  Pulse 109  Temp(Src) 98.5 F (36.9 C) (Oral)  Ht 6' (1.829 m)  Wt 240 lb (108.863 kg)  BMI 32.54 kg/m2  SpO2 100% Physical Exam  Constitutional: He is oriented to person, place, and time. He appears well-developed and well-nourished. No distress.  HENT:  Head: Normocephalic and atraumatic.  Mouth/Throat: No oropharyngeal exudate.  Eyes: EOM are normal. Pupils are equal, round, and reactive to light.  Neck:  Normal range of motion. Neck supple.  Cardiovascular: Normal rate and regular rhythm.  Exam reveals no friction rub.   No murmur heard. Pulmonary/Chest: Effort normal and breath sounds normal. No respiratory distress. He has no wheezes. He has no rales.  Abdominal: He exhibits no distension. There is no tenderness. There is no rebound.  Musculoskeletal: He exhibits no edema.       Left knee: He exhibits decreased range of motion, swelling and effusion (large).  Neurological: He is alert and oriented to person, place, and time.  Skin: He is not diaphoretic.  Nursing note and vitals reviewed.   ED Course  ARTHOCENTESIS Date/Time: 10/27/2014 3:31 PM Performed by: Evelina Bucy Authorized by: Evelina Bucy Consent: Verbal  consent obtained. Written consent obtained. Time out: Immediately prior to procedure a "time out" was called to verify the correct patient, procedure, equipment, support staff and site/side marked as required. Indications: joint swelling and pain  Body area: knee Joint: left knee Local anesthesia used: yes Local anesthetic: lidocaine 2% without epinephrine Anesthetic total: 4 ml Patient sedated: no Preparation: Patient was prepped and draped in the usual sterile fashion. Needle gauge: 20 G Ultrasound guidance: no Approach: medial Aspirate: cloudy and yellow Aspirate amount: 60 mL Patient tolerance: Patient tolerated the procedure well with no immediate complications   (including critical care time) Labs Review Labs Reviewed - No data to display  Imaging Review Dg Knee Complete 4 Views Left  10/27/2014   CLINICAL DATA:  Acute left knee pain after injury last night. Initial encounter.  EXAM: LEFT KNEE - COMPLETE 4+ VIEW  COMPARISON:  None.  FINDINGS: There is no evidence of fracture or dislocation. Mild suprapatellar joint effusion is noted. Mild narrowing of medial joint space is noted. Lucency is seen in the medial femoral condyle as well as the sub articular portion of the medial tibial plateau which may represent subchondral cyst formation, although osteochondral lesion cannot be excluded. Soft tissues are unremarkable.  IMPRESSION: Mild suprapatellar joint effusion is noted. Mild degenerative joint disease is noted medially. Lucencies are noted in the adjacent femoral condyle and tibial plateau regions most consistent with subchondral cyst formation, but osteochondral lesion in the medial femoral condyle cannot be excluded. MRI may be performed for further evaluation.   Electronically Signed   By: Marijo Conception, M.D.   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 Present for 2 weeks. Hx of gout, was taking allopurinol, however wasn't helping. Of note, he recently started dialysis about 1 month ago. Denies fevers, cough, vomiting, chills. L knee with large effusion, decreased ROM. No erythema or warmth.  I suspect likely gouty arthritis, but states he hasn't had a synovial fluid exam previously. He has long standing history of gout. Will discuss risks vs benefits of arthrocentesis for synovial fluid exam and for therapeutic benefits of pain relief. Labs ok. Arthrocentesis with large volume taken off, crystals noted, 15K white count in fluid, but with crystals, normal labs, no erythema, clinical picture points to gout. Given steroids, pain meds, instructed to f/u with PCP.  Evelina Bucy, MD 10/27/14 1534

## 2014-10-27 NOTE — ED Notes (Signed)
Left knee swelling and pain for two weeks; thought it was gout and taking medication for it, but not improving. Last night knee popped and swelling/pain increased.

## 2014-10-27 NOTE — ED Notes (Signed)
Patient transported to X-ray 

## 2014-10-27 NOTE — ED Notes (Signed)
Patient returned from X-ray 

## 2014-11-01 ENCOUNTER — Telehealth (HOSPITAL_COMMUNITY): Payer: Self-pay

## 2014-11-01 NOTE — ED Notes (Signed)
Chart returned from ED. Pt needs to return for further testing. Unable to reach by telephone. Letter sent to address on record.

## 2014-11-01 NOTE — ED Notes (Signed)
Spoke with Alyse Low and Dr Mingo Amber to clarify instructions to give pt once we reach him.  Per Dr Humphrey Rolls is a contaminant. Pt does not need to return.

## 2014-11-02 LAB — BODY FLUID CULTURE: Special Requests: NORMAL

## 2014-12-16 DIAGNOSIS — N186 End stage renal disease: Secondary | ICD-10-CM | POA: Diagnosis not present

## 2014-12-16 DIAGNOSIS — Z23 Encounter for immunization: Secondary | ICD-10-CM | POA: Diagnosis not present

## 2014-12-16 DIAGNOSIS — D509 Iron deficiency anemia, unspecified: Secondary | ICD-10-CM | POA: Diagnosis not present

## 2014-12-16 DIAGNOSIS — E1121 Type 2 diabetes mellitus with diabetic nephropathy: Secondary | ICD-10-CM | POA: Diagnosis not present

## 2014-12-16 DIAGNOSIS — D631 Anemia in chronic kidney disease: Secondary | ICD-10-CM | POA: Diagnosis not present

## 2014-12-16 DIAGNOSIS — N2581 Secondary hyperparathyroidism of renal origin: Secondary | ICD-10-CM | POA: Diagnosis not present

## 2014-12-19 ENCOUNTER — Telehealth (HOSPITAL_COMMUNITY): Payer: Self-pay

## 2014-12-19 DIAGNOSIS — N186 End stage renal disease: Secondary | ICD-10-CM | POA: Diagnosis not present

## 2014-12-19 DIAGNOSIS — Z23 Encounter for immunization: Secondary | ICD-10-CM | POA: Diagnosis not present

## 2014-12-19 DIAGNOSIS — D509 Iron deficiency anemia, unspecified: Secondary | ICD-10-CM | POA: Diagnosis not present

## 2014-12-19 DIAGNOSIS — E1121 Type 2 diabetes mellitus with diabetic nephropathy: Secondary | ICD-10-CM | POA: Diagnosis not present

## 2014-12-19 DIAGNOSIS — D631 Anemia in chronic kidney disease: Secondary | ICD-10-CM | POA: Diagnosis not present

## 2014-12-19 DIAGNOSIS — N2581 Secondary hyperparathyroidism of renal origin: Secondary | ICD-10-CM | POA: Diagnosis not present

## 2014-12-19 NOTE — ED Notes (Signed)
Unable to contact pt by mail or telephone. Unable to communicate lab results or treatment changes. 

## 2014-12-21 DIAGNOSIS — D509 Iron deficiency anemia, unspecified: Secondary | ICD-10-CM | POA: Diagnosis not present

## 2014-12-21 DIAGNOSIS — D631 Anemia in chronic kidney disease: Secondary | ICD-10-CM | POA: Diagnosis not present

## 2014-12-21 DIAGNOSIS — N2581 Secondary hyperparathyroidism of renal origin: Secondary | ICD-10-CM | POA: Diagnosis not present

## 2014-12-21 DIAGNOSIS — N186 End stage renal disease: Secondary | ICD-10-CM | POA: Diagnosis not present

## 2014-12-21 DIAGNOSIS — E1121 Type 2 diabetes mellitus with diabetic nephropathy: Secondary | ICD-10-CM | POA: Diagnosis not present

## 2014-12-21 DIAGNOSIS — Z23 Encounter for immunization: Secondary | ICD-10-CM | POA: Diagnosis not present

## 2014-12-23 DIAGNOSIS — D631 Anemia in chronic kidney disease: Secondary | ICD-10-CM | POA: Diagnosis not present

## 2014-12-23 DIAGNOSIS — D509 Iron deficiency anemia, unspecified: Secondary | ICD-10-CM | POA: Diagnosis not present

## 2014-12-23 DIAGNOSIS — N186 End stage renal disease: Secondary | ICD-10-CM | POA: Diagnosis not present

## 2014-12-23 DIAGNOSIS — Z23 Encounter for immunization: Secondary | ICD-10-CM | POA: Diagnosis not present

## 2014-12-23 DIAGNOSIS — E1121 Type 2 diabetes mellitus with diabetic nephropathy: Secondary | ICD-10-CM | POA: Diagnosis not present

## 2014-12-23 DIAGNOSIS — N2581 Secondary hyperparathyroidism of renal origin: Secondary | ICD-10-CM | POA: Diagnosis not present

## 2014-12-26 DIAGNOSIS — Z23 Encounter for immunization: Secondary | ICD-10-CM | POA: Diagnosis not present

## 2014-12-26 DIAGNOSIS — D631 Anemia in chronic kidney disease: Secondary | ICD-10-CM | POA: Diagnosis not present

## 2014-12-26 DIAGNOSIS — N186 End stage renal disease: Secondary | ICD-10-CM | POA: Diagnosis not present

## 2014-12-26 DIAGNOSIS — E1121 Type 2 diabetes mellitus with diabetic nephropathy: Secondary | ICD-10-CM | POA: Diagnosis not present

## 2014-12-26 DIAGNOSIS — D509 Iron deficiency anemia, unspecified: Secondary | ICD-10-CM | POA: Diagnosis not present

## 2014-12-26 DIAGNOSIS — N2581 Secondary hyperparathyroidism of renal origin: Secondary | ICD-10-CM | POA: Diagnosis not present

## 2014-12-28 DIAGNOSIS — N186 End stage renal disease: Secondary | ICD-10-CM | POA: Diagnosis not present

## 2014-12-28 DIAGNOSIS — D631 Anemia in chronic kidney disease: Secondary | ICD-10-CM | POA: Diagnosis not present

## 2014-12-28 DIAGNOSIS — N2581 Secondary hyperparathyroidism of renal origin: Secondary | ICD-10-CM | POA: Diagnosis not present

## 2014-12-28 DIAGNOSIS — E1121 Type 2 diabetes mellitus with diabetic nephropathy: Secondary | ICD-10-CM | POA: Diagnosis not present

## 2014-12-28 DIAGNOSIS — Z23 Encounter for immunization: Secondary | ICD-10-CM | POA: Diagnosis not present

## 2014-12-28 DIAGNOSIS — D509 Iron deficiency anemia, unspecified: Secondary | ICD-10-CM | POA: Diagnosis not present

## 2014-12-30 DIAGNOSIS — N2581 Secondary hyperparathyroidism of renal origin: Secondary | ICD-10-CM | POA: Diagnosis not present

## 2014-12-30 DIAGNOSIS — D509 Iron deficiency anemia, unspecified: Secondary | ICD-10-CM | POA: Diagnosis not present

## 2014-12-30 DIAGNOSIS — Z23 Encounter for immunization: Secondary | ICD-10-CM | POA: Diagnosis not present

## 2014-12-30 DIAGNOSIS — E1121 Type 2 diabetes mellitus with diabetic nephropathy: Secondary | ICD-10-CM | POA: Diagnosis not present

## 2014-12-30 DIAGNOSIS — N186 End stage renal disease: Secondary | ICD-10-CM | POA: Diagnosis not present

## 2014-12-30 DIAGNOSIS — D631 Anemia in chronic kidney disease: Secondary | ICD-10-CM | POA: Diagnosis not present

## 2015-01-02 DIAGNOSIS — E1121 Type 2 diabetes mellitus with diabetic nephropathy: Secondary | ICD-10-CM | POA: Diagnosis not present

## 2015-01-02 DIAGNOSIS — D509 Iron deficiency anemia, unspecified: Secondary | ICD-10-CM | POA: Diagnosis not present

## 2015-01-02 DIAGNOSIS — N186 End stage renal disease: Secondary | ICD-10-CM | POA: Diagnosis not present

## 2015-01-02 DIAGNOSIS — N2581 Secondary hyperparathyroidism of renal origin: Secondary | ICD-10-CM | POA: Diagnosis not present

## 2015-01-02 DIAGNOSIS — D631 Anemia in chronic kidney disease: Secondary | ICD-10-CM | POA: Diagnosis not present

## 2015-01-02 DIAGNOSIS — Z23 Encounter for immunization: Secondary | ICD-10-CM | POA: Diagnosis not present

## 2015-01-04 DIAGNOSIS — N186 End stage renal disease: Secondary | ICD-10-CM | POA: Diagnosis not present

## 2015-01-04 DIAGNOSIS — D631 Anemia in chronic kidney disease: Secondary | ICD-10-CM | POA: Diagnosis not present

## 2015-01-04 DIAGNOSIS — D509 Iron deficiency anemia, unspecified: Secondary | ICD-10-CM | POA: Diagnosis not present

## 2015-01-04 DIAGNOSIS — E1121 Type 2 diabetes mellitus with diabetic nephropathy: Secondary | ICD-10-CM | POA: Diagnosis not present

## 2015-01-04 DIAGNOSIS — N2581 Secondary hyperparathyroidism of renal origin: Secondary | ICD-10-CM | POA: Diagnosis not present

## 2015-01-04 DIAGNOSIS — Z23 Encounter for immunization: Secondary | ICD-10-CM | POA: Diagnosis not present

## 2015-01-05 DIAGNOSIS — E1121 Type 2 diabetes mellitus with diabetic nephropathy: Secondary | ICD-10-CM | POA: Diagnosis not present

## 2015-01-05 DIAGNOSIS — M1A3720 Chronic gout due to renal impairment, left ankle and foot, without tophus (tophi): Secondary | ICD-10-CM | POA: Diagnosis not present

## 2015-01-06 DIAGNOSIS — N2581 Secondary hyperparathyroidism of renal origin: Secondary | ICD-10-CM | POA: Diagnosis not present

## 2015-01-06 DIAGNOSIS — D509 Iron deficiency anemia, unspecified: Secondary | ICD-10-CM | POA: Diagnosis not present

## 2015-01-06 DIAGNOSIS — E1121 Type 2 diabetes mellitus with diabetic nephropathy: Secondary | ICD-10-CM | POA: Diagnosis not present

## 2015-01-06 DIAGNOSIS — D631 Anemia in chronic kidney disease: Secondary | ICD-10-CM | POA: Diagnosis not present

## 2015-01-06 DIAGNOSIS — Z23 Encounter for immunization: Secondary | ICD-10-CM | POA: Diagnosis not present

## 2015-01-06 DIAGNOSIS — N186 End stage renal disease: Secondary | ICD-10-CM | POA: Diagnosis not present

## 2015-01-09 DIAGNOSIS — E1121 Type 2 diabetes mellitus with diabetic nephropathy: Secondary | ICD-10-CM | POA: Diagnosis not present

## 2015-01-09 DIAGNOSIS — Z23 Encounter for immunization: Secondary | ICD-10-CM | POA: Diagnosis not present

## 2015-01-09 DIAGNOSIS — D509 Iron deficiency anemia, unspecified: Secondary | ICD-10-CM | POA: Diagnosis not present

## 2015-01-09 DIAGNOSIS — D631 Anemia in chronic kidney disease: Secondary | ICD-10-CM | POA: Diagnosis not present

## 2015-01-09 DIAGNOSIS — N186 End stage renal disease: Secondary | ICD-10-CM | POA: Diagnosis not present

## 2015-01-09 DIAGNOSIS — N2581 Secondary hyperparathyroidism of renal origin: Secondary | ICD-10-CM | POA: Diagnosis not present

## 2015-01-11 DIAGNOSIS — D509 Iron deficiency anemia, unspecified: Secondary | ICD-10-CM | POA: Diagnosis not present

## 2015-01-11 DIAGNOSIS — E1121 Type 2 diabetes mellitus with diabetic nephropathy: Secondary | ICD-10-CM | POA: Diagnosis not present

## 2015-01-11 DIAGNOSIS — Z23 Encounter for immunization: Secondary | ICD-10-CM | POA: Diagnosis not present

## 2015-01-11 DIAGNOSIS — D631 Anemia in chronic kidney disease: Secondary | ICD-10-CM | POA: Diagnosis not present

## 2015-01-11 DIAGNOSIS — N186 End stage renal disease: Secondary | ICD-10-CM | POA: Diagnosis not present

## 2015-01-11 DIAGNOSIS — N2581 Secondary hyperparathyroidism of renal origin: Secondary | ICD-10-CM | POA: Diagnosis not present

## 2015-01-13 DIAGNOSIS — N186 End stage renal disease: Secondary | ICD-10-CM | POA: Diagnosis not present

## 2015-01-13 DIAGNOSIS — Z23 Encounter for immunization: Secondary | ICD-10-CM | POA: Diagnosis not present

## 2015-01-13 DIAGNOSIS — D509 Iron deficiency anemia, unspecified: Secondary | ICD-10-CM | POA: Diagnosis not present

## 2015-01-13 DIAGNOSIS — D631 Anemia in chronic kidney disease: Secondary | ICD-10-CM | POA: Diagnosis not present

## 2015-01-13 DIAGNOSIS — N2581 Secondary hyperparathyroidism of renal origin: Secondary | ICD-10-CM | POA: Diagnosis not present

## 2015-01-13 DIAGNOSIS — E1121 Type 2 diabetes mellitus with diabetic nephropathy: Secondary | ICD-10-CM | POA: Diagnosis not present

## 2015-01-15 DIAGNOSIS — N186 End stage renal disease: Secondary | ICD-10-CM | POA: Diagnosis not present

## 2015-01-15 DIAGNOSIS — E1122 Type 2 diabetes mellitus with diabetic chronic kidney disease: Secondary | ICD-10-CM | POA: Diagnosis not present

## 2015-01-15 DIAGNOSIS — Z992 Dependence on renal dialysis: Secondary | ICD-10-CM | POA: Diagnosis not present

## 2015-01-16 DIAGNOSIS — D631 Anemia in chronic kidney disease: Secondary | ICD-10-CM | POA: Diagnosis not present

## 2015-01-16 DIAGNOSIS — D509 Iron deficiency anemia, unspecified: Secondary | ICD-10-CM | POA: Diagnosis not present

## 2015-01-16 DIAGNOSIS — N186 End stage renal disease: Secondary | ICD-10-CM | POA: Diagnosis not present

## 2015-01-16 DIAGNOSIS — N2581 Secondary hyperparathyroidism of renal origin: Secondary | ICD-10-CM | POA: Diagnosis not present

## 2015-01-18 DIAGNOSIS — N2581 Secondary hyperparathyroidism of renal origin: Secondary | ICD-10-CM | POA: Diagnosis not present

## 2015-01-18 DIAGNOSIS — D509 Iron deficiency anemia, unspecified: Secondary | ICD-10-CM | POA: Diagnosis not present

## 2015-01-18 DIAGNOSIS — N186 End stage renal disease: Secondary | ICD-10-CM | POA: Diagnosis not present

## 2015-01-18 DIAGNOSIS — D631 Anemia in chronic kidney disease: Secondary | ICD-10-CM | POA: Diagnosis not present

## 2015-01-20 DIAGNOSIS — D631 Anemia in chronic kidney disease: Secondary | ICD-10-CM | POA: Diagnosis not present

## 2015-01-20 DIAGNOSIS — N186 End stage renal disease: Secondary | ICD-10-CM | POA: Diagnosis not present

## 2015-01-20 DIAGNOSIS — N2581 Secondary hyperparathyroidism of renal origin: Secondary | ICD-10-CM | POA: Diagnosis not present

## 2015-01-20 DIAGNOSIS — D509 Iron deficiency anemia, unspecified: Secondary | ICD-10-CM | POA: Diagnosis not present

## 2015-01-23 DIAGNOSIS — D631 Anemia in chronic kidney disease: Secondary | ICD-10-CM | POA: Diagnosis not present

## 2015-01-23 DIAGNOSIS — N186 End stage renal disease: Secondary | ICD-10-CM | POA: Diagnosis not present

## 2015-01-23 DIAGNOSIS — D509 Iron deficiency anemia, unspecified: Secondary | ICD-10-CM | POA: Diagnosis not present

## 2015-01-23 DIAGNOSIS — N2581 Secondary hyperparathyroidism of renal origin: Secondary | ICD-10-CM | POA: Diagnosis not present

## 2015-01-25 DIAGNOSIS — D509 Iron deficiency anemia, unspecified: Secondary | ICD-10-CM | POA: Diagnosis not present

## 2015-01-25 DIAGNOSIS — D631 Anemia in chronic kidney disease: Secondary | ICD-10-CM | POA: Diagnosis not present

## 2015-01-25 DIAGNOSIS — N2581 Secondary hyperparathyroidism of renal origin: Secondary | ICD-10-CM | POA: Diagnosis not present

## 2015-01-25 DIAGNOSIS — N186 End stage renal disease: Secondary | ICD-10-CM | POA: Diagnosis not present

## 2015-01-27 DIAGNOSIS — D631 Anemia in chronic kidney disease: Secondary | ICD-10-CM | POA: Diagnosis not present

## 2015-01-27 DIAGNOSIS — N186 End stage renal disease: Secondary | ICD-10-CM | POA: Diagnosis not present

## 2015-01-27 DIAGNOSIS — N2581 Secondary hyperparathyroidism of renal origin: Secondary | ICD-10-CM | POA: Diagnosis not present

## 2015-01-27 DIAGNOSIS — D509 Iron deficiency anemia, unspecified: Secondary | ICD-10-CM | POA: Diagnosis not present

## 2015-01-30 DIAGNOSIS — N186 End stage renal disease: Secondary | ICD-10-CM | POA: Diagnosis not present

## 2015-01-30 DIAGNOSIS — D509 Iron deficiency anemia, unspecified: Secondary | ICD-10-CM | POA: Diagnosis not present

## 2015-01-30 DIAGNOSIS — D631 Anemia in chronic kidney disease: Secondary | ICD-10-CM | POA: Diagnosis not present

## 2015-01-30 DIAGNOSIS — N2581 Secondary hyperparathyroidism of renal origin: Secondary | ICD-10-CM | POA: Diagnosis not present

## 2015-02-01 DIAGNOSIS — N186 End stage renal disease: Secondary | ICD-10-CM | POA: Diagnosis not present

## 2015-02-01 DIAGNOSIS — N2581 Secondary hyperparathyroidism of renal origin: Secondary | ICD-10-CM | POA: Diagnosis not present

## 2015-02-01 DIAGNOSIS — D631 Anemia in chronic kidney disease: Secondary | ICD-10-CM | POA: Diagnosis not present

## 2015-02-01 DIAGNOSIS — D509 Iron deficiency anemia, unspecified: Secondary | ICD-10-CM | POA: Diagnosis not present

## 2015-02-03 DIAGNOSIS — N186 End stage renal disease: Secondary | ICD-10-CM | POA: Diagnosis not present

## 2015-02-03 DIAGNOSIS — D631 Anemia in chronic kidney disease: Secondary | ICD-10-CM | POA: Diagnosis not present

## 2015-02-03 DIAGNOSIS — N2581 Secondary hyperparathyroidism of renal origin: Secondary | ICD-10-CM | POA: Diagnosis not present

## 2015-02-03 DIAGNOSIS — D509 Iron deficiency anemia, unspecified: Secondary | ICD-10-CM | POA: Diagnosis not present

## 2015-02-06 DIAGNOSIS — N2581 Secondary hyperparathyroidism of renal origin: Secondary | ICD-10-CM | POA: Diagnosis not present

## 2015-02-06 DIAGNOSIS — D509 Iron deficiency anemia, unspecified: Secondary | ICD-10-CM | POA: Diagnosis not present

## 2015-02-06 DIAGNOSIS — D631 Anemia in chronic kidney disease: Secondary | ICD-10-CM | POA: Diagnosis not present

## 2015-02-06 DIAGNOSIS — N186 End stage renal disease: Secondary | ICD-10-CM | POA: Diagnosis not present

## 2015-02-08 DIAGNOSIS — D509 Iron deficiency anemia, unspecified: Secondary | ICD-10-CM | POA: Diagnosis not present

## 2015-02-08 DIAGNOSIS — N186 End stage renal disease: Secondary | ICD-10-CM | POA: Diagnosis not present

## 2015-02-08 DIAGNOSIS — D631 Anemia in chronic kidney disease: Secondary | ICD-10-CM | POA: Diagnosis not present

## 2015-02-08 DIAGNOSIS — N2581 Secondary hyperparathyroidism of renal origin: Secondary | ICD-10-CM | POA: Diagnosis not present

## 2015-02-10 DIAGNOSIS — D509 Iron deficiency anemia, unspecified: Secondary | ICD-10-CM | POA: Diagnosis not present

## 2015-02-10 DIAGNOSIS — N186 End stage renal disease: Secondary | ICD-10-CM | POA: Diagnosis not present

## 2015-02-10 DIAGNOSIS — D631 Anemia in chronic kidney disease: Secondary | ICD-10-CM | POA: Diagnosis not present

## 2015-02-10 DIAGNOSIS — N2581 Secondary hyperparathyroidism of renal origin: Secondary | ICD-10-CM | POA: Diagnosis not present

## 2015-02-13 DIAGNOSIS — N186 End stage renal disease: Secondary | ICD-10-CM | POA: Diagnosis not present

## 2015-02-13 DIAGNOSIS — N2581 Secondary hyperparathyroidism of renal origin: Secondary | ICD-10-CM | POA: Diagnosis not present

## 2015-02-13 DIAGNOSIS — D631 Anemia in chronic kidney disease: Secondary | ICD-10-CM | POA: Diagnosis not present

## 2015-02-13 DIAGNOSIS — D509 Iron deficiency anemia, unspecified: Secondary | ICD-10-CM | POA: Diagnosis not present

## 2015-02-15 DIAGNOSIS — D631 Anemia in chronic kidney disease: Secondary | ICD-10-CM | POA: Diagnosis not present

## 2015-02-15 DIAGNOSIS — Z992 Dependence on renal dialysis: Secondary | ICD-10-CM | POA: Diagnosis not present

## 2015-02-15 DIAGNOSIS — E1122 Type 2 diabetes mellitus with diabetic chronic kidney disease: Secondary | ICD-10-CM | POA: Diagnosis not present

## 2015-02-15 DIAGNOSIS — N2581 Secondary hyperparathyroidism of renal origin: Secondary | ICD-10-CM | POA: Diagnosis not present

## 2015-02-15 DIAGNOSIS — D509 Iron deficiency anemia, unspecified: Secondary | ICD-10-CM | POA: Diagnosis not present

## 2015-02-15 DIAGNOSIS — N186 End stage renal disease: Secondary | ICD-10-CM | POA: Diagnosis not present

## 2015-02-16 DIAGNOSIS — Z452 Encounter for adjustment and management of vascular access device: Secondary | ICD-10-CM | POA: Diagnosis not present

## 2015-02-17 DIAGNOSIS — E1121 Type 2 diabetes mellitus with diabetic nephropathy: Secondary | ICD-10-CM | POA: Diagnosis not present

## 2015-02-17 DIAGNOSIS — N186 End stage renal disease: Secondary | ICD-10-CM | POA: Diagnosis not present

## 2015-02-17 DIAGNOSIS — D631 Anemia in chronic kidney disease: Secondary | ICD-10-CM | POA: Diagnosis not present

## 2015-02-17 DIAGNOSIS — N2581 Secondary hyperparathyroidism of renal origin: Secondary | ICD-10-CM | POA: Diagnosis not present

## 2015-02-20 DIAGNOSIS — N186 End stage renal disease: Secondary | ICD-10-CM | POA: Diagnosis not present

## 2015-02-20 DIAGNOSIS — N2581 Secondary hyperparathyroidism of renal origin: Secondary | ICD-10-CM | POA: Diagnosis not present

## 2015-02-20 DIAGNOSIS — D631 Anemia in chronic kidney disease: Secondary | ICD-10-CM | POA: Diagnosis not present

## 2015-02-20 DIAGNOSIS — E1121 Type 2 diabetes mellitus with diabetic nephropathy: Secondary | ICD-10-CM | POA: Diagnosis not present

## 2015-02-22 DIAGNOSIS — N186 End stage renal disease: Secondary | ICD-10-CM | POA: Diagnosis not present

## 2015-02-22 DIAGNOSIS — E1121 Type 2 diabetes mellitus with diabetic nephropathy: Secondary | ICD-10-CM | POA: Diagnosis not present

## 2015-02-22 DIAGNOSIS — D631 Anemia in chronic kidney disease: Secondary | ICD-10-CM | POA: Diagnosis not present

## 2015-02-22 DIAGNOSIS — N2581 Secondary hyperparathyroidism of renal origin: Secondary | ICD-10-CM | POA: Diagnosis not present

## 2015-02-24 DIAGNOSIS — E1121 Type 2 diabetes mellitus with diabetic nephropathy: Secondary | ICD-10-CM | POA: Diagnosis not present

## 2015-02-24 DIAGNOSIS — D631 Anemia in chronic kidney disease: Secondary | ICD-10-CM | POA: Diagnosis not present

## 2015-02-24 DIAGNOSIS — N2581 Secondary hyperparathyroidism of renal origin: Secondary | ICD-10-CM | POA: Diagnosis not present

## 2015-02-24 DIAGNOSIS — N186 End stage renal disease: Secondary | ICD-10-CM | POA: Diagnosis not present

## 2015-02-27 DIAGNOSIS — D631 Anemia in chronic kidney disease: Secondary | ICD-10-CM | POA: Diagnosis not present

## 2015-02-27 DIAGNOSIS — E1121 Type 2 diabetes mellitus with diabetic nephropathy: Secondary | ICD-10-CM | POA: Diagnosis not present

## 2015-02-27 DIAGNOSIS — N2581 Secondary hyperparathyroidism of renal origin: Secondary | ICD-10-CM | POA: Diagnosis not present

## 2015-02-27 DIAGNOSIS — N186 End stage renal disease: Secondary | ICD-10-CM | POA: Diagnosis not present

## 2015-03-01 DIAGNOSIS — N186 End stage renal disease: Secondary | ICD-10-CM | POA: Diagnosis not present

## 2015-03-01 DIAGNOSIS — E1121 Type 2 diabetes mellitus with diabetic nephropathy: Secondary | ICD-10-CM | POA: Diagnosis not present

## 2015-03-01 DIAGNOSIS — N2581 Secondary hyperparathyroidism of renal origin: Secondary | ICD-10-CM | POA: Diagnosis not present

## 2015-03-01 DIAGNOSIS — D631 Anemia in chronic kidney disease: Secondary | ICD-10-CM | POA: Diagnosis not present

## 2015-03-03 DIAGNOSIS — D631 Anemia in chronic kidney disease: Secondary | ICD-10-CM | POA: Diagnosis not present

## 2015-03-03 DIAGNOSIS — E1121 Type 2 diabetes mellitus with diabetic nephropathy: Secondary | ICD-10-CM | POA: Diagnosis not present

## 2015-03-03 DIAGNOSIS — N186 End stage renal disease: Secondary | ICD-10-CM | POA: Diagnosis not present

## 2015-03-03 DIAGNOSIS — N2581 Secondary hyperparathyroidism of renal origin: Secondary | ICD-10-CM | POA: Diagnosis not present

## 2015-03-06 DIAGNOSIS — N2581 Secondary hyperparathyroidism of renal origin: Secondary | ICD-10-CM | POA: Diagnosis not present

## 2015-03-06 DIAGNOSIS — D631 Anemia in chronic kidney disease: Secondary | ICD-10-CM | POA: Diagnosis not present

## 2015-03-06 DIAGNOSIS — E1121 Type 2 diabetes mellitus with diabetic nephropathy: Secondary | ICD-10-CM | POA: Diagnosis not present

## 2015-03-06 DIAGNOSIS — N186 End stage renal disease: Secondary | ICD-10-CM | POA: Diagnosis not present

## 2015-03-08 DIAGNOSIS — N2581 Secondary hyperparathyroidism of renal origin: Secondary | ICD-10-CM | POA: Diagnosis not present

## 2015-03-08 DIAGNOSIS — E1121 Type 2 diabetes mellitus with diabetic nephropathy: Secondary | ICD-10-CM | POA: Diagnosis not present

## 2015-03-08 DIAGNOSIS — D631 Anemia in chronic kidney disease: Secondary | ICD-10-CM | POA: Diagnosis not present

## 2015-03-08 DIAGNOSIS — N186 End stage renal disease: Secondary | ICD-10-CM | POA: Diagnosis not present

## 2015-03-09 DIAGNOSIS — M1A3720 Chronic gout due to renal impairment, left ankle and foot, without tophus (tophi): Secondary | ICD-10-CM | POA: Diagnosis not present

## 2015-03-09 DIAGNOSIS — E1121 Type 2 diabetes mellitus with diabetic nephropathy: Secondary | ICD-10-CM | POA: Diagnosis not present

## 2015-03-09 DIAGNOSIS — E1140 Type 2 diabetes mellitus with diabetic neuropathy, unspecified: Secondary | ICD-10-CM | POA: Diagnosis not present

## 2015-03-10 DIAGNOSIS — N186 End stage renal disease: Secondary | ICD-10-CM | POA: Diagnosis not present

## 2015-03-10 DIAGNOSIS — N2581 Secondary hyperparathyroidism of renal origin: Secondary | ICD-10-CM | POA: Diagnosis not present

## 2015-03-10 DIAGNOSIS — D631 Anemia in chronic kidney disease: Secondary | ICD-10-CM | POA: Diagnosis not present

## 2015-03-10 DIAGNOSIS — E1121 Type 2 diabetes mellitus with diabetic nephropathy: Secondary | ICD-10-CM | POA: Diagnosis not present

## 2015-03-13 DIAGNOSIS — N186 End stage renal disease: Secondary | ICD-10-CM | POA: Diagnosis not present

## 2015-03-13 DIAGNOSIS — D631 Anemia in chronic kidney disease: Secondary | ICD-10-CM | POA: Diagnosis not present

## 2015-03-13 DIAGNOSIS — E1121 Type 2 diabetes mellitus with diabetic nephropathy: Secondary | ICD-10-CM | POA: Diagnosis not present

## 2015-03-13 DIAGNOSIS — N2581 Secondary hyperparathyroidism of renal origin: Secondary | ICD-10-CM | POA: Diagnosis not present

## 2015-03-15 DIAGNOSIS — N186 End stage renal disease: Secondary | ICD-10-CM | POA: Diagnosis not present

## 2015-03-15 DIAGNOSIS — E1121 Type 2 diabetes mellitus with diabetic nephropathy: Secondary | ICD-10-CM | POA: Diagnosis not present

## 2015-03-15 DIAGNOSIS — D631 Anemia in chronic kidney disease: Secondary | ICD-10-CM | POA: Diagnosis not present

## 2015-03-15 DIAGNOSIS — N2581 Secondary hyperparathyroidism of renal origin: Secondary | ICD-10-CM | POA: Diagnosis not present

## 2015-03-16 DIAGNOSIS — E1121 Type 2 diabetes mellitus with diabetic nephropathy: Secondary | ICD-10-CM | POA: Diagnosis not present

## 2015-03-16 DIAGNOSIS — D631 Anemia in chronic kidney disease: Secondary | ICD-10-CM | POA: Diagnosis not present

## 2015-03-16 DIAGNOSIS — N186 End stage renal disease: Secondary | ICD-10-CM | POA: Diagnosis not present

## 2015-03-16 DIAGNOSIS — N2581 Secondary hyperparathyroidism of renal origin: Secondary | ICD-10-CM | POA: Diagnosis not present

## 2015-03-17 DIAGNOSIS — D631 Anemia in chronic kidney disease: Secondary | ICD-10-CM | POA: Diagnosis not present

## 2015-03-17 DIAGNOSIS — N2581 Secondary hyperparathyroidism of renal origin: Secondary | ICD-10-CM | POA: Diagnosis not present

## 2015-03-17 DIAGNOSIS — E1122 Type 2 diabetes mellitus with diabetic chronic kidney disease: Secondary | ICD-10-CM | POA: Diagnosis not present

## 2015-03-17 DIAGNOSIS — E1121 Type 2 diabetes mellitus with diabetic nephropathy: Secondary | ICD-10-CM | POA: Diagnosis not present

## 2015-03-17 DIAGNOSIS — Z992 Dependence on renal dialysis: Secondary | ICD-10-CM | POA: Diagnosis not present

## 2015-03-17 DIAGNOSIS — N186 End stage renal disease: Secondary | ICD-10-CM | POA: Diagnosis not present

## 2015-03-20 DIAGNOSIS — N2581 Secondary hyperparathyroidism of renal origin: Secondary | ICD-10-CM | POA: Diagnosis not present

## 2015-03-20 DIAGNOSIS — D509 Iron deficiency anemia, unspecified: Secondary | ICD-10-CM | POA: Diagnosis not present

## 2015-03-20 DIAGNOSIS — D631 Anemia in chronic kidney disease: Secondary | ICD-10-CM | POA: Diagnosis not present

## 2015-03-20 DIAGNOSIS — N186 End stage renal disease: Secondary | ICD-10-CM | POA: Diagnosis not present

## 2015-03-20 DIAGNOSIS — E1121 Type 2 diabetes mellitus with diabetic nephropathy: Secondary | ICD-10-CM | POA: Diagnosis not present

## 2015-03-20 DIAGNOSIS — Z23 Encounter for immunization: Secondary | ICD-10-CM | POA: Diagnosis not present

## 2015-03-22 DIAGNOSIS — E1121 Type 2 diabetes mellitus with diabetic nephropathy: Secondary | ICD-10-CM | POA: Diagnosis not present

## 2015-03-22 DIAGNOSIS — D509 Iron deficiency anemia, unspecified: Secondary | ICD-10-CM | POA: Diagnosis not present

## 2015-03-22 DIAGNOSIS — N2581 Secondary hyperparathyroidism of renal origin: Secondary | ICD-10-CM | POA: Diagnosis not present

## 2015-03-22 DIAGNOSIS — N186 End stage renal disease: Secondary | ICD-10-CM | POA: Diagnosis not present

## 2015-03-22 DIAGNOSIS — Z23 Encounter for immunization: Secondary | ICD-10-CM | POA: Diagnosis not present

## 2015-03-22 DIAGNOSIS — D631 Anemia in chronic kidney disease: Secondary | ICD-10-CM | POA: Diagnosis not present

## 2015-03-24 DIAGNOSIS — D631 Anemia in chronic kidney disease: Secondary | ICD-10-CM | POA: Diagnosis not present

## 2015-03-24 DIAGNOSIS — N186 End stage renal disease: Secondary | ICD-10-CM | POA: Diagnosis not present

## 2015-03-24 DIAGNOSIS — Z23 Encounter for immunization: Secondary | ICD-10-CM | POA: Diagnosis not present

## 2015-03-24 DIAGNOSIS — E1121 Type 2 diabetes mellitus with diabetic nephropathy: Secondary | ICD-10-CM | POA: Diagnosis not present

## 2015-03-24 DIAGNOSIS — D509 Iron deficiency anemia, unspecified: Secondary | ICD-10-CM | POA: Diagnosis not present

## 2015-03-24 DIAGNOSIS — N2581 Secondary hyperparathyroidism of renal origin: Secondary | ICD-10-CM | POA: Diagnosis not present

## 2015-03-27 DIAGNOSIS — Z23 Encounter for immunization: Secondary | ICD-10-CM | POA: Diagnosis not present

## 2015-03-27 DIAGNOSIS — D631 Anemia in chronic kidney disease: Secondary | ICD-10-CM | POA: Diagnosis not present

## 2015-03-27 DIAGNOSIS — D509 Iron deficiency anemia, unspecified: Secondary | ICD-10-CM | POA: Diagnosis not present

## 2015-03-27 DIAGNOSIS — N2581 Secondary hyperparathyroidism of renal origin: Secondary | ICD-10-CM | POA: Diagnosis not present

## 2015-03-27 DIAGNOSIS — E1121 Type 2 diabetes mellitus with diabetic nephropathy: Secondary | ICD-10-CM | POA: Diagnosis not present

## 2015-03-27 DIAGNOSIS — N186 End stage renal disease: Secondary | ICD-10-CM | POA: Diagnosis not present

## 2015-03-29 DIAGNOSIS — D509 Iron deficiency anemia, unspecified: Secondary | ICD-10-CM | POA: Diagnosis not present

## 2015-03-29 DIAGNOSIS — D631 Anemia in chronic kidney disease: Secondary | ICD-10-CM | POA: Diagnosis not present

## 2015-03-29 DIAGNOSIS — N2581 Secondary hyperparathyroidism of renal origin: Secondary | ICD-10-CM | POA: Diagnosis not present

## 2015-03-29 DIAGNOSIS — E1121 Type 2 diabetes mellitus with diabetic nephropathy: Secondary | ICD-10-CM | POA: Diagnosis not present

## 2015-03-29 DIAGNOSIS — Z23 Encounter for immunization: Secondary | ICD-10-CM | POA: Diagnosis not present

## 2015-03-29 DIAGNOSIS — N186 End stage renal disease: Secondary | ICD-10-CM | POA: Diagnosis not present

## 2015-03-31 DIAGNOSIS — Z23 Encounter for immunization: Secondary | ICD-10-CM | POA: Diagnosis not present

## 2015-03-31 DIAGNOSIS — D631 Anemia in chronic kidney disease: Secondary | ICD-10-CM | POA: Diagnosis not present

## 2015-03-31 DIAGNOSIS — N186 End stage renal disease: Secondary | ICD-10-CM | POA: Diagnosis not present

## 2015-03-31 DIAGNOSIS — N2581 Secondary hyperparathyroidism of renal origin: Secondary | ICD-10-CM | POA: Diagnosis not present

## 2015-03-31 DIAGNOSIS — E1121 Type 2 diabetes mellitus with diabetic nephropathy: Secondary | ICD-10-CM | POA: Diagnosis not present

## 2015-03-31 DIAGNOSIS — D509 Iron deficiency anemia, unspecified: Secondary | ICD-10-CM | POA: Diagnosis not present

## 2015-04-03 DIAGNOSIS — N186 End stage renal disease: Secondary | ICD-10-CM | POA: Diagnosis not present

## 2015-04-03 DIAGNOSIS — E1121 Type 2 diabetes mellitus with diabetic nephropathy: Secondary | ICD-10-CM | POA: Diagnosis not present

## 2015-04-03 DIAGNOSIS — D631 Anemia in chronic kidney disease: Secondary | ICD-10-CM | POA: Diagnosis not present

## 2015-04-03 DIAGNOSIS — N2581 Secondary hyperparathyroidism of renal origin: Secondary | ICD-10-CM | POA: Diagnosis not present

## 2015-04-03 DIAGNOSIS — Z23 Encounter for immunization: Secondary | ICD-10-CM | POA: Diagnosis not present

## 2015-04-03 DIAGNOSIS — D509 Iron deficiency anemia, unspecified: Secondary | ICD-10-CM | POA: Diagnosis not present

## 2015-04-05 DIAGNOSIS — E1121 Type 2 diabetes mellitus with diabetic nephropathy: Secondary | ICD-10-CM | POA: Diagnosis not present

## 2015-04-05 DIAGNOSIS — Z23 Encounter for immunization: Secondary | ICD-10-CM | POA: Diagnosis not present

## 2015-04-05 DIAGNOSIS — D509 Iron deficiency anemia, unspecified: Secondary | ICD-10-CM | POA: Diagnosis not present

## 2015-04-05 DIAGNOSIS — D631 Anemia in chronic kidney disease: Secondary | ICD-10-CM | POA: Diagnosis not present

## 2015-04-05 DIAGNOSIS — N186 End stage renal disease: Secondary | ICD-10-CM | POA: Diagnosis not present

## 2015-04-05 DIAGNOSIS — N2581 Secondary hyperparathyroidism of renal origin: Secondary | ICD-10-CM | POA: Diagnosis not present

## 2015-04-07 DIAGNOSIS — D509 Iron deficiency anemia, unspecified: Secondary | ICD-10-CM | POA: Diagnosis not present

## 2015-04-07 DIAGNOSIS — N2581 Secondary hyperparathyroidism of renal origin: Secondary | ICD-10-CM | POA: Diagnosis not present

## 2015-04-07 DIAGNOSIS — D631 Anemia in chronic kidney disease: Secondary | ICD-10-CM | POA: Diagnosis not present

## 2015-04-07 DIAGNOSIS — N186 End stage renal disease: Secondary | ICD-10-CM | POA: Diagnosis not present

## 2015-04-07 DIAGNOSIS — E1121 Type 2 diabetes mellitus with diabetic nephropathy: Secondary | ICD-10-CM | POA: Diagnosis not present

## 2015-04-07 DIAGNOSIS — Z23 Encounter for immunization: Secondary | ICD-10-CM | POA: Diagnosis not present

## 2015-04-10 DIAGNOSIS — E1121 Type 2 diabetes mellitus with diabetic nephropathy: Secondary | ICD-10-CM | POA: Diagnosis not present

## 2015-04-10 DIAGNOSIS — N2581 Secondary hyperparathyroidism of renal origin: Secondary | ICD-10-CM | POA: Diagnosis not present

## 2015-04-10 DIAGNOSIS — D509 Iron deficiency anemia, unspecified: Secondary | ICD-10-CM | POA: Diagnosis not present

## 2015-04-10 DIAGNOSIS — D631 Anemia in chronic kidney disease: Secondary | ICD-10-CM | POA: Diagnosis not present

## 2015-04-10 DIAGNOSIS — N186 End stage renal disease: Secondary | ICD-10-CM | POA: Diagnosis not present

## 2015-04-10 DIAGNOSIS — Z23 Encounter for immunization: Secondary | ICD-10-CM | POA: Diagnosis not present

## 2015-04-12 DIAGNOSIS — N186 End stage renal disease: Secondary | ICD-10-CM | POA: Diagnosis not present

## 2015-04-12 DIAGNOSIS — Z23 Encounter for immunization: Secondary | ICD-10-CM | POA: Diagnosis not present

## 2015-04-12 DIAGNOSIS — E1121 Type 2 diabetes mellitus with diabetic nephropathy: Secondary | ICD-10-CM | POA: Diagnosis not present

## 2015-04-12 DIAGNOSIS — N2581 Secondary hyperparathyroidism of renal origin: Secondary | ICD-10-CM | POA: Diagnosis not present

## 2015-04-12 DIAGNOSIS — D631 Anemia in chronic kidney disease: Secondary | ICD-10-CM | POA: Diagnosis not present

## 2015-04-12 DIAGNOSIS — D509 Iron deficiency anemia, unspecified: Secondary | ICD-10-CM | POA: Diagnosis not present

## 2015-04-14 DIAGNOSIS — N2581 Secondary hyperparathyroidism of renal origin: Secondary | ICD-10-CM | POA: Diagnosis not present

## 2015-04-14 DIAGNOSIS — E1121 Type 2 diabetes mellitus with diabetic nephropathy: Secondary | ICD-10-CM | POA: Diagnosis not present

## 2015-04-14 DIAGNOSIS — Z23 Encounter for immunization: Secondary | ICD-10-CM | POA: Diagnosis not present

## 2015-04-14 DIAGNOSIS — D509 Iron deficiency anemia, unspecified: Secondary | ICD-10-CM | POA: Diagnosis not present

## 2015-04-14 DIAGNOSIS — D631 Anemia in chronic kidney disease: Secondary | ICD-10-CM | POA: Diagnosis not present

## 2015-04-14 DIAGNOSIS — N186 End stage renal disease: Secondary | ICD-10-CM | POA: Diagnosis not present

## 2015-04-17 ENCOUNTER — Ambulatory Visit (INDEPENDENT_AMBULATORY_CARE_PROVIDER_SITE_OTHER): Payer: Medicare Other | Admitting: Podiatry

## 2015-04-17 ENCOUNTER — Encounter: Payer: Self-pay | Admitting: Podiatry

## 2015-04-17 VITALS — BP 138/66 | HR 79 | Resp 16 | Ht 72.0 in | Wt 240.0 lb

## 2015-04-17 DIAGNOSIS — D509 Iron deficiency anemia, unspecified: Secondary | ICD-10-CM | POA: Diagnosis not present

## 2015-04-17 DIAGNOSIS — Z23 Encounter for immunization: Secondary | ICD-10-CM | POA: Diagnosis not present

## 2015-04-17 DIAGNOSIS — D2372 Other benign neoplasm of skin of left lower limb, including hip: Secondary | ICD-10-CM | POA: Diagnosis not present

## 2015-04-17 DIAGNOSIS — B070 Plantar wart: Secondary | ICD-10-CM | POA: Diagnosis not present

## 2015-04-17 DIAGNOSIS — D631 Anemia in chronic kidney disease: Secondary | ICD-10-CM | POA: Diagnosis not present

## 2015-04-17 DIAGNOSIS — M1000 Idiopathic gout, unspecified site: Secondary | ICD-10-CM | POA: Diagnosis not present

## 2015-04-17 DIAGNOSIS — E1122 Type 2 diabetes mellitus with diabetic chronic kidney disease: Secondary | ICD-10-CM | POA: Diagnosis not present

## 2015-04-17 DIAGNOSIS — N2581 Secondary hyperparathyroidism of renal origin: Secondary | ICD-10-CM | POA: Diagnosis not present

## 2015-04-17 DIAGNOSIS — B079 Viral wart, unspecified: Secondary | ICD-10-CM

## 2015-04-17 DIAGNOSIS — B078 Other viral warts: Secondary | ICD-10-CM

## 2015-04-17 DIAGNOSIS — Z992 Dependence on renal dialysis: Secondary | ICD-10-CM | POA: Diagnosis not present

## 2015-04-17 DIAGNOSIS — N186 End stage renal disease: Secondary | ICD-10-CM | POA: Diagnosis not present

## 2015-04-17 DIAGNOSIS — E1121 Type 2 diabetes mellitus with diabetic nephropathy: Secondary | ICD-10-CM | POA: Diagnosis not present

## 2015-04-17 NOTE — Progress Notes (Signed)
   Subjective:    Patient ID: Troy Wells, male    DOB: 04-23-1966, 49 y.o.   MRN: [id number]  HPI  Patient presents with a wart on their left foot, great toe-medial side. Pt stated, "tried to freeze off and some came off"; x9 months.   Review of Systems  All other systems reviewed and are negative.      Objective:   Physical Exam        Assessment & Plan:

## 2015-04-17 NOTE — Patient Instructions (Addendum)
ANTIBACTERIAL SOAP INSTRUCTIONS  THE DAY AFTER PROCEDURE  Please follow the instructions your doctor has marked.   Shower as usual. Before getting out, place a drop of antibacterial liquid soap (Dial) on a wet, clean washcloth.  Gently wipe washcloth over affected area.  Afterward, rinse the area with warm water.  Blot the area dry with a soft cloth and cover with antibiotic ointment (neosporin, polysporin, bacitracin) and band aid or gauze and tape  Place 3-4 drops of antibacterial liquid soap in a quart of warm tap water.  Submerge foot into water for 20 minutes.  If bandage was applied after your procedure, leave on to allow for easy lift off, then remove and continue with soak for the remaining time.  Next, blot area dry with a soft cloth and cover with a bandage.  Apply other medications as directed by your doctor, such as cortisporin otic solution (eardrops) or neosporin antibiotic ointment   Long Term Care Instructions-Post Nail Surgery  You have had your ingrown toenail and root treated with a chemical.  This chemical causes a burn that will drain and ooze like a blister.  This can drain for 6-8 weeks or longer.  It is important to keep this area clean, covered, and follow the soaking instructions dispensed at the time of your surgery.  This area will eventually dry and form a scab.  Once the scab forms you no longer need to soak or apply a dressing.  If at any time you experience an increase in pain, redness, swelling, or drainage, you should contact the office as soon as possible.     Information for patients with Gout  Gout defined-Gout occurs when urate crystals accumulate in your joint causing the inflammation and intense pain of gout attack.  Urate crystals can form when you have high levels of uric acid in your blood.  Your body produces uric acid when it breaks down prurines-substances that are found naturally in your body, as well as in certain foods such as organ meats,  anchioves, herring, asparagus, and mushrooms.  Normally uric acid dissolves in your blood and passes through your kidneys into your urine.  But sometimes your body either produces too much uric acid or your kidneys excrete too little uric acid.  When this happens, uric acid can build up, forming sharp needle-like urate crystals in a joint or surrounding tissue that cause pain, inflammation and swelling.    Gout is characterized by sudden, severe attacks of pain, redness and tenderness in joints, often the joint at the base of the big toe.  Gout is complex form of arthritis that can affect anyone.  Men are more likely to get gout but women become increasingly more susceptible to gout after menopause.  An acute attack of gout can wake you up in the middle of the night with the sensation that your big toe is on fire.  The affected joint is hot, swollen and so tender that even the weight or the sheet on it may seem intolerable.  If you experience symptoms of an acute gout attack it is important to your doctor as soon as the symptoms start.  Gout that goes untreated can lead to worsening pain and joint damage.  Risk Factors:  You are more likely to develop gout if you have high levels of uric acid in your body.    Factors that increase the uric acid level in your body include:  Lifestyle factors.  Excessive alcohol use-generally more than two drinks  a day for men and more than one for women increase the risk of gout.  Medical conditions.  Certain conditions make it more likely that you will develop gout.  These include hypertension, and chronic conditions such as diabetes, high levels of fat and cholesterol in the blood, and narrowing of the arteries.  Certain medications.  The uses of Thiazide diuretics- commonly used to treat hypertension and low dose aspirin can also increase uric acid levels.  Family history of gout.  If other members of your family have had gout, you are more likely to develop  the disease.  Age and sex. Gout occurs more often in men than it does in women, primarily because women tend to have lower uric acid levels than men do.  Men are more likely to develop gout earlier usually between the ages of 69-50- whereas women generally develop signs and symptoms after menopause.    Tests and diagnosis:  Tests to help diagnose gout may include:  Blood test.  Your doctor may recommend a blood test to measure the uric acid level in your blood .  Blood tests can be misleading, though.  Some people have high uric acid levels but never experience gout.  And some people have signs and symptoms of gout, but don't have unusual levels of uric acid in their blood.  Joint fluid test.  Your doctor may use a needle to draw fluid from your affected joint.  When examined under the microscope, your joint fluid may reveal urate crystals.  Treatment:  Treatment for gout usually involves medications.  What medications you and your doctor choose will be based on your current health and other medications you currently take.  Gout medications can be used to treat acute gout attacks and prevent future attacks as well as reduce your risk of complications from gout such as the development of tophi from urate crystal deposits.  Alternative medicine:   Certain foods have been studied for their potential to lower uric acid levels, including:  Coffee.  Studies have found an association between coffee drinking (regular and decaf) and lower uric acid levels.  The evidence is not enough to encourage non-coffee drinkers to start, but it may give clues to new ways of treating gout in the future.  Vitamin C.  Supplements containing vitamin C may reduce the levels of uric acid in your blood.  However, vitamin as a treatment for gout. Don't assume that if a little vitamin C is good, than lots is better.  Megadoses of vitamin C may increase your bodies uric acid levels.  Cherries.  Cherries have been  associated with lower levels of uric acid in studies, but it isn't clear if they have any effect on gout signs and symptoms.  Eating more cherries and other dar-colored fruits, such as blackberries, blueberries, purple grapes and raspberries, may be a safe way to support your gout treatment.    Lifestyle/Diet Recommendations:   Drink 8 to 16 cups ( about 2 to 4 liters) of fluid each day, with at least half being water.  Avoid alcohol  Eat a moderate amount of protein, preferably from healthy sources, such as low-fat or fat-free dairy, tofu, eggs, and nut butters.  Limit you daily intake of meat, fish, and poultry to 4 to 6 ounces.  Avoid high fat meats and desserts.  Decrease you intake of shellfish, beef, lamb, pork, eggs and cheese.  Choose a good source of vitamin C daily such as citrus fruits, strawberries, broccoli,  brussel sprouts, papaya, and cantaloupe.   Choose a good source of vitamin A every other day such as yellow fruits, or dark green/yellow vegetables.  Avoid drastic weigh reduction or fasting.  If weigh loss is desired lose it over a period of several months.  See "dietary considerations.." chart for specific food recommendations.  Dietary Considerations for people with Gout  Food with negligible purine content (0-15 mg of purine nitrogen per 100 grams food)  May use as desired except on calorie variations  Non fat milk Cocoa Cereals (except in list II) Hard candies  Buttermilk Carbonated drinks Vegetables (except in list II) Sherbet  Coffee Fruits Sugar Honey  Tea Cottage Cheese Gelatin-jell-o Salt  Fruit juice Breads Angel food Cake   Herbs/spices Jams/Jellies [employer]    Foods that do not contain excessive purine content, but must be limited due to fat content  Cream Eggs Oil and Salad Dressing  Half and Half Peanut Butter Chocolate  Whole Milk Cakes Potato Chips  Butter Ice Cream Fried Foods  Cheese Nuts Waffles, pancakes   List II:  Food with moderate purine content (50-150 mg of purine nitrogen per 100 grams of food)  Limit total amount each day to 5 oz. cooked Lean meat, other than those on list III   Poultry, other than those on list III Fish, other than those on list III   Seafood, other than those on list III  These foods may be used occasionally  Peas Lentils Bran  Spinach Oatmeal Dried Beans and Peas  Asparagus Wheat Germ Mushrooms   Additional information about meat choices  Choose fish and poultry, particularly without skin, often.  Select lean, well trimmed cuts of meat.  Avoid all fatty meats, bacon , sausage, fried meats, fried fish, or poultry, luncheon meats, cold cuts, hot dogs, meats canned or frozen in gravy, spareribs and frozen and packaged prepared meats.   List III: Foods with HIGH purine content / Foods to AVOID (150-800 mg of purine nitrogen per 100 grams of food)  Anchovies Herring Meat Broths  Liver Mackerel Meat Extracts  Kidney Scallops Meat Drippings  Sardines Wild Game Mincemeat  Sweetbreads Goose Gravy  Heart Tongue Yeast, baker's and brewers   Commercial soups made with any of the foods listed in List II or List III  In addition avoid all alcoholic beverages

## 2015-04-18 ENCOUNTER — Telehealth: Payer: Self-pay | Admitting: *Deleted

## 2015-04-18 NOTE — Telephone Encounter (Signed)
Called patient at [phone] (Cell #) to see how they were feeling from their wart that was removed on Monday, April 17, 2015. Pt stated, "feeling good, but was throbbing at 2:00 am this morning". Pt has soaked toe with some relief. Advised pt to take some ibuprofen if they are having any toe pain. Pt stated they understood.

## 2015-04-19 DIAGNOSIS — E1121 Type 2 diabetes mellitus with diabetic nephropathy: Secondary | ICD-10-CM | POA: Diagnosis not present

## 2015-04-19 DIAGNOSIS — N186 End stage renal disease: Secondary | ICD-10-CM | POA: Diagnosis not present

## 2015-04-19 DIAGNOSIS — D631 Anemia in chronic kidney disease: Secondary | ICD-10-CM | POA: Diagnosis not present

## 2015-04-19 DIAGNOSIS — N2581 Secondary hyperparathyroidism of renal origin: Secondary | ICD-10-CM | POA: Diagnosis not present

## 2015-04-19 DIAGNOSIS — D509 Iron deficiency anemia, unspecified: Secondary | ICD-10-CM | POA: Diagnosis not present

## 2015-04-19 NOTE — Progress Notes (Signed)
Subjective:     Patient ID: Troy Wells, male   DOB: Jun 23, 1965, 49 y.o.   MRN: [id number]  HPI patient presents stating I have a lot of pain on the side of my left big toe that I cannot wear shoe gear with and I've tried to trim it and use medication without relief   Review of Systems  All other systems reviewed and are negative.      Objective:   Physical Exam  Constitutional: He is oriented to person, place, and time.  Cardiovascular: Intact distal pulses.   Musculoskeletal: Normal range of motion.  Neurological: He is oriented to person, place, and time.  Skin: Skin is warm.  Nursing note and vitals reviewed.  neurovascular status intact muscle strength adequate range of motion within normal limits with mass on the plantar aspect of the left big toe measuring approximately 1.2 cm x 6 mm that's painful when pressed from a lateral direction with pinpoint bleeding upon debridement. Patient's noted to have good digital perfusion and is well oriented 3 and once probable verruca plantaris plantar left hallux versus other unknown soft tissue mass     Assessment:     Above    Plan:     H&P and condition reviewed with patient. At this point I have recommended surgical excision due to failure to respond to conservative care and I went ahead and anesthetized the toe 60 mg Xylocaine Marcaine mixture and under sterile conditions I removed the mass in toto and applied phenol to the base along with sterile dressing. Gave instructions on soaks and reappoint

## 2015-04-21 DIAGNOSIS — D631 Anemia in chronic kidney disease: Secondary | ICD-10-CM | POA: Diagnosis not present

## 2015-04-21 DIAGNOSIS — D509 Iron deficiency anemia, unspecified: Secondary | ICD-10-CM | POA: Diagnosis not present

## 2015-04-21 DIAGNOSIS — N2581 Secondary hyperparathyroidism of renal origin: Secondary | ICD-10-CM | POA: Diagnosis not present

## 2015-04-21 DIAGNOSIS — N186 End stage renal disease: Secondary | ICD-10-CM | POA: Diagnosis not present

## 2015-04-21 DIAGNOSIS — E1121 Type 2 diabetes mellitus with diabetic nephropathy: Secondary | ICD-10-CM | POA: Diagnosis not present

## 2015-04-24 DIAGNOSIS — N186 End stage renal disease: Secondary | ICD-10-CM | POA: Diagnosis not present

## 2015-04-24 DIAGNOSIS — D631 Anemia in chronic kidney disease: Secondary | ICD-10-CM | POA: Diagnosis not present

## 2015-04-24 DIAGNOSIS — D509 Iron deficiency anemia, unspecified: Secondary | ICD-10-CM | POA: Diagnosis not present

## 2015-04-24 DIAGNOSIS — N2581 Secondary hyperparathyroidism of renal origin: Secondary | ICD-10-CM | POA: Diagnosis not present

## 2015-04-24 DIAGNOSIS — E1121 Type 2 diabetes mellitus with diabetic nephropathy: Secondary | ICD-10-CM | POA: Diagnosis not present

## 2015-04-26 DIAGNOSIS — N2581 Secondary hyperparathyroidism of renal origin: Secondary | ICD-10-CM | POA: Diagnosis not present

## 2015-04-26 DIAGNOSIS — E1121 Type 2 diabetes mellitus with diabetic nephropathy: Secondary | ICD-10-CM | POA: Diagnosis not present

## 2015-04-26 DIAGNOSIS — N186 End stage renal disease: Secondary | ICD-10-CM | POA: Diagnosis not present

## 2015-04-26 DIAGNOSIS — D509 Iron deficiency anemia, unspecified: Secondary | ICD-10-CM | POA: Diagnosis not present

## 2015-04-26 DIAGNOSIS — D631 Anemia in chronic kidney disease: Secondary | ICD-10-CM | POA: Diagnosis not present

## 2015-04-28 DIAGNOSIS — N186 End stage renal disease: Secondary | ICD-10-CM | POA: Diagnosis not present

## 2015-04-28 DIAGNOSIS — N2581 Secondary hyperparathyroidism of renal origin: Secondary | ICD-10-CM | POA: Diagnosis not present

## 2015-04-28 DIAGNOSIS — D631 Anemia in chronic kidney disease: Secondary | ICD-10-CM | POA: Diagnosis not present

## 2015-04-28 DIAGNOSIS — D509 Iron deficiency anemia, unspecified: Secondary | ICD-10-CM | POA: Diagnosis not present

## 2015-04-28 DIAGNOSIS — E1121 Type 2 diabetes mellitus with diabetic nephropathy: Secondary | ICD-10-CM | POA: Diagnosis not present

## 2015-05-01 ENCOUNTER — Telehealth: Payer: Self-pay | Admitting: *Deleted

## 2015-05-01 DIAGNOSIS — E1121 Type 2 diabetes mellitus with diabetic nephropathy: Secondary | ICD-10-CM | POA: Diagnosis not present

## 2015-05-01 DIAGNOSIS — D631 Anemia in chronic kidney disease: Secondary | ICD-10-CM | POA: Diagnosis not present

## 2015-05-01 DIAGNOSIS — D509 Iron deficiency anemia, unspecified: Secondary | ICD-10-CM | POA: Diagnosis not present

## 2015-05-01 DIAGNOSIS — N186 End stage renal disease: Secondary | ICD-10-CM | POA: Diagnosis not present

## 2015-05-01 DIAGNOSIS — N2581 Secondary hyperparathyroidism of renal origin: Secondary | ICD-10-CM | POA: Diagnosis not present

## 2015-05-01 NOTE — Telephone Encounter (Signed)
Dr. Paulla Dolly reviewed biopsy of left hallux as an irritated pore, benign and low probability of recurrence.  Information called to pt and he states he is doing well.

## 2015-05-03 DIAGNOSIS — N2581 Secondary hyperparathyroidism of renal origin: Secondary | ICD-10-CM | POA: Diagnosis not present

## 2015-05-03 DIAGNOSIS — D631 Anemia in chronic kidney disease: Secondary | ICD-10-CM | POA: Diagnosis not present

## 2015-05-03 DIAGNOSIS — D509 Iron deficiency anemia, unspecified: Secondary | ICD-10-CM | POA: Diagnosis not present

## 2015-05-03 DIAGNOSIS — E1121 Type 2 diabetes mellitus with diabetic nephropathy: Secondary | ICD-10-CM | POA: Diagnosis not present

## 2015-05-03 DIAGNOSIS — N186 End stage renal disease: Secondary | ICD-10-CM | POA: Diagnosis not present

## 2015-05-05 DIAGNOSIS — N186 End stage renal disease: Secondary | ICD-10-CM | POA: Diagnosis not present

## 2015-05-05 DIAGNOSIS — N2581 Secondary hyperparathyroidism of renal origin: Secondary | ICD-10-CM | POA: Diagnosis not present

## 2015-05-05 DIAGNOSIS — D509 Iron deficiency anemia, unspecified: Secondary | ICD-10-CM | POA: Diagnosis not present

## 2015-05-05 DIAGNOSIS — E1121 Type 2 diabetes mellitus with diabetic nephropathy: Secondary | ICD-10-CM | POA: Diagnosis not present

## 2015-05-05 DIAGNOSIS — D631 Anemia in chronic kidney disease: Secondary | ICD-10-CM | POA: Diagnosis not present

## 2015-05-08 DIAGNOSIS — D509 Iron deficiency anemia, unspecified: Secondary | ICD-10-CM | POA: Diagnosis not present

## 2015-05-08 DIAGNOSIS — D631 Anemia in chronic kidney disease: Secondary | ICD-10-CM | POA: Diagnosis not present

## 2015-05-08 DIAGNOSIS — N2581 Secondary hyperparathyroidism of renal origin: Secondary | ICD-10-CM | POA: Diagnosis not present

## 2015-05-08 DIAGNOSIS — E1121 Type 2 diabetes mellitus with diabetic nephropathy: Secondary | ICD-10-CM | POA: Diagnosis not present

## 2015-05-08 DIAGNOSIS — N186 End stage renal disease: Secondary | ICD-10-CM | POA: Diagnosis not present

## 2015-05-10 DIAGNOSIS — N2581 Secondary hyperparathyroidism of renal origin: Secondary | ICD-10-CM | POA: Diagnosis not present

## 2015-05-10 DIAGNOSIS — E1121 Type 2 diabetes mellitus with diabetic nephropathy: Secondary | ICD-10-CM | POA: Diagnosis not present

## 2015-05-10 DIAGNOSIS — D509 Iron deficiency anemia, unspecified: Secondary | ICD-10-CM | POA: Diagnosis not present

## 2015-05-10 DIAGNOSIS — D631 Anemia in chronic kidney disease: Secondary | ICD-10-CM | POA: Diagnosis not present

## 2015-05-10 DIAGNOSIS — N186 End stage renal disease: Secondary | ICD-10-CM | POA: Diagnosis not present

## 2015-05-13 DIAGNOSIS — E1121 Type 2 diabetes mellitus with diabetic nephropathy: Secondary | ICD-10-CM | POA: Diagnosis not present

## 2015-05-13 DIAGNOSIS — N186 End stage renal disease: Secondary | ICD-10-CM | POA: Diagnosis not present

## 2015-05-13 DIAGNOSIS — D631 Anemia in chronic kidney disease: Secondary | ICD-10-CM | POA: Diagnosis not present

## 2015-05-13 DIAGNOSIS — N2581 Secondary hyperparathyroidism of renal origin: Secondary | ICD-10-CM | POA: Diagnosis not present

## 2015-05-13 DIAGNOSIS — D509 Iron deficiency anemia, unspecified: Secondary | ICD-10-CM | POA: Diagnosis not present

## 2015-05-15 DIAGNOSIS — N2581 Secondary hyperparathyroidism of renal origin: Secondary | ICD-10-CM | POA: Diagnosis not present

## 2015-05-15 DIAGNOSIS — D631 Anemia in chronic kidney disease: Secondary | ICD-10-CM | POA: Diagnosis not present

## 2015-05-15 DIAGNOSIS — N186 End stage renal disease: Secondary | ICD-10-CM | POA: Diagnosis not present

## 2015-05-15 DIAGNOSIS — D509 Iron deficiency anemia, unspecified: Secondary | ICD-10-CM | POA: Diagnosis not present

## 2015-05-15 DIAGNOSIS — E1121 Type 2 diabetes mellitus with diabetic nephropathy: Secondary | ICD-10-CM | POA: Diagnosis not present

## 2015-05-17 DIAGNOSIS — D631 Anemia in chronic kidney disease: Secondary | ICD-10-CM | POA: Diagnosis not present

## 2015-05-17 DIAGNOSIS — N186 End stage renal disease: Secondary | ICD-10-CM | POA: Diagnosis not present

## 2015-05-17 DIAGNOSIS — N2581 Secondary hyperparathyroidism of renal origin: Secondary | ICD-10-CM | POA: Diagnosis not present

## 2015-05-17 DIAGNOSIS — Z992 Dependence on renal dialysis: Secondary | ICD-10-CM | POA: Diagnosis not present

## 2015-05-17 DIAGNOSIS — E1122 Type 2 diabetes mellitus with diabetic chronic kidney disease: Secondary | ICD-10-CM | POA: Diagnosis not present

## 2015-05-17 DIAGNOSIS — D509 Iron deficiency anemia, unspecified: Secondary | ICD-10-CM | POA: Diagnosis not present

## 2015-05-17 DIAGNOSIS — E1121 Type 2 diabetes mellitus with diabetic nephropathy: Secondary | ICD-10-CM | POA: Diagnosis not present

## 2015-05-19 DIAGNOSIS — D509 Iron deficiency anemia, unspecified: Secondary | ICD-10-CM | POA: Diagnosis not present

## 2015-05-19 DIAGNOSIS — N186 End stage renal disease: Secondary | ICD-10-CM | POA: Diagnosis not present

## 2015-05-19 DIAGNOSIS — E875 Hyperkalemia: Secondary | ICD-10-CM | POA: Diagnosis not present

## 2015-05-19 DIAGNOSIS — E1121 Type 2 diabetes mellitus with diabetic nephropathy: Secondary | ICD-10-CM | POA: Diagnosis not present

## 2015-05-19 DIAGNOSIS — D631 Anemia in chronic kidney disease: Secondary | ICD-10-CM | POA: Diagnosis not present

## 2015-05-19 DIAGNOSIS — N2581 Secondary hyperparathyroidism of renal origin: Secondary | ICD-10-CM | POA: Diagnosis not present

## 2015-05-22 DIAGNOSIS — E875 Hyperkalemia: Secondary | ICD-10-CM | POA: Diagnosis not present

## 2015-05-22 DIAGNOSIS — E1121 Type 2 diabetes mellitus with diabetic nephropathy: Secondary | ICD-10-CM | POA: Diagnosis not present

## 2015-05-22 DIAGNOSIS — D631 Anemia in chronic kidney disease: Secondary | ICD-10-CM | POA: Diagnosis not present

## 2015-05-22 DIAGNOSIS — N2581 Secondary hyperparathyroidism of renal origin: Secondary | ICD-10-CM | POA: Diagnosis not present

## 2015-05-22 DIAGNOSIS — N186 End stage renal disease: Secondary | ICD-10-CM | POA: Diagnosis not present

## 2015-05-22 DIAGNOSIS — D509 Iron deficiency anemia, unspecified: Secondary | ICD-10-CM | POA: Diagnosis not present

## 2015-05-24 DIAGNOSIS — E1121 Type 2 diabetes mellitus with diabetic nephropathy: Secondary | ICD-10-CM | POA: Diagnosis not present

## 2015-05-24 DIAGNOSIS — N186 End stage renal disease: Secondary | ICD-10-CM | POA: Diagnosis not present

## 2015-05-24 DIAGNOSIS — E875 Hyperkalemia: Secondary | ICD-10-CM | POA: Diagnosis not present

## 2015-05-24 DIAGNOSIS — N2581 Secondary hyperparathyroidism of renal origin: Secondary | ICD-10-CM | POA: Diagnosis not present

## 2015-05-24 DIAGNOSIS — D509 Iron deficiency anemia, unspecified: Secondary | ICD-10-CM | POA: Diagnosis not present

## 2015-05-24 DIAGNOSIS — D631 Anemia in chronic kidney disease: Secondary | ICD-10-CM | POA: Diagnosis not present

## 2015-05-26 DIAGNOSIS — D631 Anemia in chronic kidney disease: Secondary | ICD-10-CM | POA: Diagnosis not present

## 2015-05-26 DIAGNOSIS — E875 Hyperkalemia: Secondary | ICD-10-CM | POA: Diagnosis not present

## 2015-05-26 DIAGNOSIS — D509 Iron deficiency anemia, unspecified: Secondary | ICD-10-CM | POA: Diagnosis not present

## 2015-05-26 DIAGNOSIS — N2581 Secondary hyperparathyroidism of renal origin: Secondary | ICD-10-CM | POA: Diagnosis not present

## 2015-05-26 DIAGNOSIS — E1121 Type 2 diabetes mellitus with diabetic nephropathy: Secondary | ICD-10-CM | POA: Diagnosis not present

## 2015-05-26 DIAGNOSIS — N186 End stage renal disease: Secondary | ICD-10-CM | POA: Diagnosis not present

## 2015-05-29 DIAGNOSIS — E875 Hyperkalemia: Secondary | ICD-10-CM | POA: Diagnosis not present

## 2015-05-29 DIAGNOSIS — D631 Anemia in chronic kidney disease: Secondary | ICD-10-CM | POA: Diagnosis not present

## 2015-05-29 DIAGNOSIS — N2581 Secondary hyperparathyroidism of renal origin: Secondary | ICD-10-CM | POA: Diagnosis not present

## 2015-05-29 DIAGNOSIS — E1121 Type 2 diabetes mellitus with diabetic nephropathy: Secondary | ICD-10-CM | POA: Diagnosis not present

## 2015-05-29 DIAGNOSIS — N186 End stage renal disease: Secondary | ICD-10-CM | POA: Diagnosis not present

## 2015-05-29 DIAGNOSIS — D509 Iron deficiency anemia, unspecified: Secondary | ICD-10-CM | POA: Diagnosis not present

## 2015-05-31 DIAGNOSIS — E1121 Type 2 diabetes mellitus with diabetic nephropathy: Secondary | ICD-10-CM | POA: Diagnosis not present

## 2015-05-31 DIAGNOSIS — E875 Hyperkalemia: Secondary | ICD-10-CM | POA: Diagnosis not present

## 2015-05-31 DIAGNOSIS — D509 Iron deficiency anemia, unspecified: Secondary | ICD-10-CM | POA: Diagnosis not present

## 2015-05-31 DIAGNOSIS — D631 Anemia in chronic kidney disease: Secondary | ICD-10-CM | POA: Diagnosis not present

## 2015-05-31 DIAGNOSIS — N186 End stage renal disease: Secondary | ICD-10-CM | POA: Diagnosis not present

## 2015-05-31 DIAGNOSIS — N2581 Secondary hyperparathyroidism of renal origin: Secondary | ICD-10-CM | POA: Diagnosis not present

## 2015-06-02 DIAGNOSIS — N2581 Secondary hyperparathyroidism of renal origin: Secondary | ICD-10-CM | POA: Diagnosis not present

## 2015-06-02 DIAGNOSIS — E875 Hyperkalemia: Secondary | ICD-10-CM | POA: Diagnosis not present

## 2015-06-02 DIAGNOSIS — N186 End stage renal disease: Secondary | ICD-10-CM | POA: Diagnosis not present

## 2015-06-02 DIAGNOSIS — D631 Anemia in chronic kidney disease: Secondary | ICD-10-CM | POA: Diagnosis not present

## 2015-06-02 DIAGNOSIS — E1121 Type 2 diabetes mellitus with diabetic nephropathy: Secondary | ICD-10-CM | POA: Diagnosis not present

## 2015-06-02 DIAGNOSIS — D509 Iron deficiency anemia, unspecified: Secondary | ICD-10-CM | POA: Diagnosis not present

## 2015-06-05 DIAGNOSIS — N186 End stage renal disease: Secondary | ICD-10-CM | POA: Diagnosis not present

## 2015-06-05 DIAGNOSIS — E875 Hyperkalemia: Secondary | ICD-10-CM | POA: Diagnosis not present

## 2015-06-05 DIAGNOSIS — D509 Iron deficiency anemia, unspecified: Secondary | ICD-10-CM | POA: Diagnosis not present

## 2015-06-05 DIAGNOSIS — D631 Anemia in chronic kidney disease: Secondary | ICD-10-CM | POA: Diagnosis not present

## 2015-06-05 DIAGNOSIS — E1121 Type 2 diabetes mellitus with diabetic nephropathy: Secondary | ICD-10-CM | POA: Diagnosis not present

## 2015-06-05 DIAGNOSIS — N2581 Secondary hyperparathyroidism of renal origin: Secondary | ICD-10-CM | POA: Diagnosis not present

## 2015-06-07 DIAGNOSIS — E1121 Type 2 diabetes mellitus with diabetic nephropathy: Secondary | ICD-10-CM | POA: Diagnosis not present

## 2015-06-07 DIAGNOSIS — N186 End stage renal disease: Secondary | ICD-10-CM | POA: Diagnosis not present

## 2015-06-07 DIAGNOSIS — N2581 Secondary hyperparathyroidism of renal origin: Secondary | ICD-10-CM | POA: Diagnosis not present

## 2015-06-07 DIAGNOSIS — D509 Iron deficiency anemia, unspecified: Secondary | ICD-10-CM | POA: Diagnosis not present

## 2015-06-07 DIAGNOSIS — D631 Anemia in chronic kidney disease: Secondary | ICD-10-CM | POA: Diagnosis not present

## 2015-06-07 DIAGNOSIS — E875 Hyperkalemia: Secondary | ICD-10-CM | POA: Diagnosis not present

## 2015-06-09 DIAGNOSIS — E1121 Type 2 diabetes mellitus with diabetic nephropathy: Secondary | ICD-10-CM | POA: Diagnosis not present

## 2015-06-09 DIAGNOSIS — N2581 Secondary hyperparathyroidism of renal origin: Secondary | ICD-10-CM | POA: Diagnosis not present

## 2015-06-09 DIAGNOSIS — E875 Hyperkalemia: Secondary | ICD-10-CM | POA: Diagnosis not present

## 2015-06-09 DIAGNOSIS — D631 Anemia in chronic kidney disease: Secondary | ICD-10-CM | POA: Diagnosis not present

## 2015-06-09 DIAGNOSIS — D509 Iron deficiency anemia, unspecified: Secondary | ICD-10-CM | POA: Diagnosis not present

## 2015-06-09 DIAGNOSIS — N186 End stage renal disease: Secondary | ICD-10-CM | POA: Diagnosis not present

## 2015-06-12 DIAGNOSIS — D509 Iron deficiency anemia, unspecified: Secondary | ICD-10-CM | POA: Diagnosis not present

## 2015-06-12 DIAGNOSIS — D631 Anemia in chronic kidney disease: Secondary | ICD-10-CM | POA: Diagnosis not present

## 2015-06-12 DIAGNOSIS — N2581 Secondary hyperparathyroidism of renal origin: Secondary | ICD-10-CM | POA: Diagnosis not present

## 2015-06-12 DIAGNOSIS — N186 End stage renal disease: Secondary | ICD-10-CM | POA: Diagnosis not present

## 2015-06-12 DIAGNOSIS — E875 Hyperkalemia: Secondary | ICD-10-CM | POA: Diagnosis not present

## 2015-06-12 DIAGNOSIS — E1121 Type 2 diabetes mellitus with diabetic nephropathy: Secondary | ICD-10-CM | POA: Diagnosis not present

## 2015-06-14 DIAGNOSIS — N186 End stage renal disease: Secondary | ICD-10-CM | POA: Diagnosis not present

## 2015-06-14 DIAGNOSIS — E1121 Type 2 diabetes mellitus with diabetic nephropathy: Secondary | ICD-10-CM | POA: Diagnosis not present

## 2015-06-14 DIAGNOSIS — E875 Hyperkalemia: Secondary | ICD-10-CM | POA: Diagnosis not present

## 2015-06-14 DIAGNOSIS — D509 Iron deficiency anemia, unspecified: Secondary | ICD-10-CM | POA: Diagnosis not present

## 2015-06-14 DIAGNOSIS — N2581 Secondary hyperparathyroidism of renal origin: Secondary | ICD-10-CM | POA: Diagnosis not present

## 2015-06-14 DIAGNOSIS — D631 Anemia in chronic kidney disease: Secondary | ICD-10-CM | POA: Diagnosis not present

## 2015-06-16 DIAGNOSIS — D631 Anemia in chronic kidney disease: Secondary | ICD-10-CM | POA: Diagnosis not present

## 2015-06-16 DIAGNOSIS — E1121 Type 2 diabetes mellitus with diabetic nephropathy: Secondary | ICD-10-CM | POA: Diagnosis not present

## 2015-06-16 DIAGNOSIS — D509 Iron deficiency anemia, unspecified: Secondary | ICD-10-CM | POA: Diagnosis not present

## 2015-06-16 DIAGNOSIS — N2581 Secondary hyperparathyroidism of renal origin: Secondary | ICD-10-CM | POA: Diagnosis not present

## 2015-06-16 DIAGNOSIS — E875 Hyperkalemia: Secondary | ICD-10-CM | POA: Diagnosis not present

## 2015-06-16 DIAGNOSIS — N186 End stage renal disease: Secondary | ICD-10-CM | POA: Diagnosis not present

## 2015-06-17 DIAGNOSIS — Z992 Dependence on renal dialysis: Secondary | ICD-10-CM | POA: Diagnosis not present

## 2015-06-17 DIAGNOSIS — N186 End stage renal disease: Secondary | ICD-10-CM | POA: Diagnosis not present

## 2015-06-17 DIAGNOSIS — E1122 Type 2 diabetes mellitus with diabetic chronic kidney disease: Secondary | ICD-10-CM | POA: Diagnosis not present

## 2015-06-19 DIAGNOSIS — N186 End stage renal disease: Secondary | ICD-10-CM | POA: Diagnosis not present

## 2015-06-19 DIAGNOSIS — E875 Hyperkalemia: Secondary | ICD-10-CM | POA: Diagnosis not present

## 2015-06-19 DIAGNOSIS — D631 Anemia in chronic kidney disease: Secondary | ICD-10-CM | POA: Diagnosis not present

## 2015-06-19 DIAGNOSIS — D509 Iron deficiency anemia, unspecified: Secondary | ICD-10-CM | POA: Diagnosis not present

## 2015-06-19 DIAGNOSIS — N2581 Secondary hyperparathyroidism of renal origin: Secondary | ICD-10-CM | POA: Diagnosis not present

## 2015-06-19 DIAGNOSIS — E1121 Type 2 diabetes mellitus with diabetic nephropathy: Secondary | ICD-10-CM | POA: Diagnosis not present

## 2015-06-21 DIAGNOSIS — N186 End stage renal disease: Secondary | ICD-10-CM | POA: Diagnosis not present

## 2015-06-21 DIAGNOSIS — E1121 Type 2 diabetes mellitus with diabetic nephropathy: Secondary | ICD-10-CM | POA: Diagnosis not present

## 2015-06-21 DIAGNOSIS — E875 Hyperkalemia: Secondary | ICD-10-CM | POA: Diagnosis not present

## 2015-06-21 DIAGNOSIS — N2581 Secondary hyperparathyroidism of renal origin: Secondary | ICD-10-CM | POA: Diagnosis not present

## 2015-06-21 DIAGNOSIS — D631 Anemia in chronic kidney disease: Secondary | ICD-10-CM | POA: Diagnosis not present

## 2015-06-21 DIAGNOSIS — D509 Iron deficiency anemia, unspecified: Secondary | ICD-10-CM | POA: Diagnosis not present

## 2015-06-23 DIAGNOSIS — N186 End stage renal disease: Secondary | ICD-10-CM | POA: Diagnosis not present

## 2015-06-23 DIAGNOSIS — D631 Anemia in chronic kidney disease: Secondary | ICD-10-CM | POA: Diagnosis not present

## 2015-06-23 DIAGNOSIS — E1121 Type 2 diabetes mellitus with diabetic nephropathy: Secondary | ICD-10-CM | POA: Diagnosis not present

## 2015-06-23 DIAGNOSIS — D509 Iron deficiency anemia, unspecified: Secondary | ICD-10-CM | POA: Diagnosis not present

## 2015-06-23 DIAGNOSIS — E875 Hyperkalemia: Secondary | ICD-10-CM | POA: Diagnosis not present

## 2015-06-23 DIAGNOSIS — N2581 Secondary hyperparathyroidism of renal origin: Secondary | ICD-10-CM | POA: Diagnosis not present

## 2015-06-26 DIAGNOSIS — E1121 Type 2 diabetes mellitus with diabetic nephropathy: Secondary | ICD-10-CM | POA: Diagnosis not present

## 2015-06-26 DIAGNOSIS — D509 Iron deficiency anemia, unspecified: Secondary | ICD-10-CM | POA: Diagnosis not present

## 2015-06-26 DIAGNOSIS — D631 Anemia in chronic kidney disease: Secondary | ICD-10-CM | POA: Diagnosis not present

## 2015-06-26 DIAGNOSIS — N186 End stage renal disease: Secondary | ICD-10-CM | POA: Diagnosis not present

## 2015-06-26 DIAGNOSIS — E875 Hyperkalemia: Secondary | ICD-10-CM | POA: Diagnosis not present

## 2015-06-26 DIAGNOSIS — N2581 Secondary hyperparathyroidism of renal origin: Secondary | ICD-10-CM | POA: Diagnosis not present

## 2015-06-28 DIAGNOSIS — D631 Anemia in chronic kidney disease: Secondary | ICD-10-CM | POA: Diagnosis not present

## 2015-06-28 DIAGNOSIS — E875 Hyperkalemia: Secondary | ICD-10-CM | POA: Diagnosis not present

## 2015-06-28 DIAGNOSIS — E1121 Type 2 diabetes mellitus with diabetic nephropathy: Secondary | ICD-10-CM | POA: Diagnosis not present

## 2015-06-28 DIAGNOSIS — D509 Iron deficiency anemia, unspecified: Secondary | ICD-10-CM | POA: Diagnosis not present

## 2015-06-28 DIAGNOSIS — N2581 Secondary hyperparathyroidism of renal origin: Secondary | ICD-10-CM | POA: Diagnosis not present

## 2015-06-28 DIAGNOSIS — N186 End stage renal disease: Secondary | ICD-10-CM | POA: Diagnosis not present

## 2015-06-30 DIAGNOSIS — N2581 Secondary hyperparathyroidism of renal origin: Secondary | ICD-10-CM | POA: Diagnosis not present

## 2015-06-30 DIAGNOSIS — E875 Hyperkalemia: Secondary | ICD-10-CM | POA: Diagnosis not present

## 2015-06-30 DIAGNOSIS — E1121 Type 2 diabetes mellitus with diabetic nephropathy: Secondary | ICD-10-CM | POA: Diagnosis not present

## 2015-06-30 DIAGNOSIS — D509 Iron deficiency anemia, unspecified: Secondary | ICD-10-CM | POA: Diagnosis not present

## 2015-06-30 DIAGNOSIS — N186 End stage renal disease: Secondary | ICD-10-CM | POA: Diagnosis not present

## 2015-06-30 DIAGNOSIS — D631 Anemia in chronic kidney disease: Secondary | ICD-10-CM | POA: Diagnosis not present

## 2015-07-03 DIAGNOSIS — E875 Hyperkalemia: Secondary | ICD-10-CM | POA: Diagnosis not present

## 2015-07-03 DIAGNOSIS — D509 Iron deficiency anemia, unspecified: Secondary | ICD-10-CM | POA: Diagnosis not present

## 2015-07-03 DIAGNOSIS — N2581 Secondary hyperparathyroidism of renal origin: Secondary | ICD-10-CM | POA: Diagnosis not present

## 2015-07-03 DIAGNOSIS — N186 End stage renal disease: Secondary | ICD-10-CM | POA: Diagnosis not present

## 2015-07-03 DIAGNOSIS — D631 Anemia in chronic kidney disease: Secondary | ICD-10-CM | POA: Diagnosis not present

## 2015-07-03 DIAGNOSIS — E1121 Type 2 diabetes mellitus with diabetic nephropathy: Secondary | ICD-10-CM | POA: Diagnosis not present

## 2015-07-05 DIAGNOSIS — D631 Anemia in chronic kidney disease: Secondary | ICD-10-CM | POA: Diagnosis not present

## 2015-07-05 DIAGNOSIS — E875 Hyperkalemia: Secondary | ICD-10-CM | POA: Diagnosis not present

## 2015-07-05 DIAGNOSIS — N2581 Secondary hyperparathyroidism of renal origin: Secondary | ICD-10-CM | POA: Diagnosis not present

## 2015-07-05 DIAGNOSIS — N186 End stage renal disease: Secondary | ICD-10-CM | POA: Diagnosis not present

## 2015-07-05 DIAGNOSIS — D509 Iron deficiency anemia, unspecified: Secondary | ICD-10-CM | POA: Diagnosis not present

## 2015-07-05 DIAGNOSIS — E1121 Type 2 diabetes mellitus with diabetic nephropathy: Secondary | ICD-10-CM | POA: Diagnosis not present

## 2015-07-07 DIAGNOSIS — E875 Hyperkalemia: Secondary | ICD-10-CM | POA: Diagnosis not present

## 2015-07-07 DIAGNOSIS — E1121 Type 2 diabetes mellitus with diabetic nephropathy: Secondary | ICD-10-CM | POA: Diagnosis not present

## 2015-07-07 DIAGNOSIS — N2581 Secondary hyperparathyroidism of renal origin: Secondary | ICD-10-CM | POA: Diagnosis not present

## 2015-07-07 DIAGNOSIS — N186 End stage renal disease: Secondary | ICD-10-CM | POA: Diagnosis not present

## 2015-07-07 DIAGNOSIS — D509 Iron deficiency anemia, unspecified: Secondary | ICD-10-CM | POA: Diagnosis not present

## 2015-07-07 DIAGNOSIS — D631 Anemia in chronic kidney disease: Secondary | ICD-10-CM | POA: Diagnosis not present

## 2015-07-10 DIAGNOSIS — E875 Hyperkalemia: Secondary | ICD-10-CM | POA: Diagnosis not present

## 2015-07-10 DIAGNOSIS — D509 Iron deficiency anemia, unspecified: Secondary | ICD-10-CM | POA: Diagnosis not present

## 2015-07-10 DIAGNOSIS — N2581 Secondary hyperparathyroidism of renal origin: Secondary | ICD-10-CM | POA: Diagnosis not present

## 2015-07-10 DIAGNOSIS — E1121 Type 2 diabetes mellitus with diabetic nephropathy: Secondary | ICD-10-CM | POA: Diagnosis not present

## 2015-07-10 DIAGNOSIS — N186 End stage renal disease: Secondary | ICD-10-CM | POA: Diagnosis not present

## 2015-07-10 DIAGNOSIS — D631 Anemia in chronic kidney disease: Secondary | ICD-10-CM | POA: Diagnosis not present

## 2015-07-12 DIAGNOSIS — E875 Hyperkalemia: Secondary | ICD-10-CM | POA: Diagnosis not present

## 2015-07-12 DIAGNOSIS — D509 Iron deficiency anemia, unspecified: Secondary | ICD-10-CM | POA: Diagnosis not present

## 2015-07-12 DIAGNOSIS — E1121 Type 2 diabetes mellitus with diabetic nephropathy: Secondary | ICD-10-CM | POA: Diagnosis not present

## 2015-07-12 DIAGNOSIS — N186 End stage renal disease: Secondary | ICD-10-CM | POA: Diagnosis not present

## 2015-07-12 DIAGNOSIS — D631 Anemia in chronic kidney disease: Secondary | ICD-10-CM | POA: Diagnosis not present

## 2015-07-12 DIAGNOSIS — N2581 Secondary hyperparathyroidism of renal origin: Secondary | ICD-10-CM | POA: Diagnosis not present

## 2015-07-13 DIAGNOSIS — I10 Essential (primary) hypertension: Secondary | ICD-10-CM | POA: Diagnosis not present

## 2015-07-13 DIAGNOSIS — E1140 Type 2 diabetes mellitus with diabetic neuropathy, unspecified: Secondary | ICD-10-CM | POA: Diagnosis not present

## 2015-07-14 DIAGNOSIS — E875 Hyperkalemia: Secondary | ICD-10-CM | POA: Diagnosis not present

## 2015-07-14 DIAGNOSIS — N2581 Secondary hyperparathyroidism of renal origin: Secondary | ICD-10-CM | POA: Diagnosis not present

## 2015-07-14 DIAGNOSIS — E1121 Type 2 diabetes mellitus with diabetic nephropathy: Secondary | ICD-10-CM | POA: Diagnosis not present

## 2015-07-14 DIAGNOSIS — D631 Anemia in chronic kidney disease: Secondary | ICD-10-CM | POA: Diagnosis not present

## 2015-07-14 DIAGNOSIS — N186 End stage renal disease: Secondary | ICD-10-CM | POA: Diagnosis not present

## 2015-07-14 DIAGNOSIS — D509 Iron deficiency anemia, unspecified: Secondary | ICD-10-CM | POA: Diagnosis not present

## 2015-07-17 DIAGNOSIS — E1121 Type 2 diabetes mellitus with diabetic nephropathy: Secondary | ICD-10-CM | POA: Diagnosis not present

## 2015-07-17 DIAGNOSIS — D509 Iron deficiency anemia, unspecified: Secondary | ICD-10-CM | POA: Diagnosis not present

## 2015-07-17 DIAGNOSIS — N2581 Secondary hyperparathyroidism of renal origin: Secondary | ICD-10-CM | POA: Diagnosis not present

## 2015-07-17 DIAGNOSIS — N186 End stage renal disease: Secondary | ICD-10-CM | POA: Diagnosis not present

## 2015-07-17 DIAGNOSIS — E875 Hyperkalemia: Secondary | ICD-10-CM | POA: Diagnosis not present

## 2015-07-17 DIAGNOSIS — D631 Anemia in chronic kidney disease: Secondary | ICD-10-CM | POA: Diagnosis not present

## 2015-07-18 DIAGNOSIS — E1122 Type 2 diabetes mellitus with diabetic chronic kidney disease: Secondary | ICD-10-CM | POA: Diagnosis not present

## 2015-07-18 DIAGNOSIS — Z992 Dependence on renal dialysis: Secondary | ICD-10-CM | POA: Diagnosis not present

## 2015-07-18 DIAGNOSIS — N186 End stage renal disease: Secondary | ICD-10-CM | POA: Diagnosis not present

## 2015-07-19 DIAGNOSIS — D631 Anemia in chronic kidney disease: Secondary | ICD-10-CM | POA: Diagnosis not present

## 2015-07-19 DIAGNOSIS — E1121 Type 2 diabetes mellitus with diabetic nephropathy: Secondary | ICD-10-CM | POA: Diagnosis not present

## 2015-07-19 DIAGNOSIS — D509 Iron deficiency anemia, unspecified: Secondary | ICD-10-CM | POA: Diagnosis not present

## 2015-07-19 DIAGNOSIS — N186 End stage renal disease: Secondary | ICD-10-CM | POA: Diagnosis not present

## 2015-07-19 DIAGNOSIS — N2581 Secondary hyperparathyroidism of renal origin: Secondary | ICD-10-CM | POA: Diagnosis not present

## 2015-07-21 DIAGNOSIS — E1121 Type 2 diabetes mellitus with diabetic nephropathy: Secondary | ICD-10-CM | POA: Diagnosis not present

## 2015-07-21 DIAGNOSIS — N2581 Secondary hyperparathyroidism of renal origin: Secondary | ICD-10-CM | POA: Diagnosis not present

## 2015-07-21 DIAGNOSIS — D509 Iron deficiency anemia, unspecified: Secondary | ICD-10-CM | POA: Diagnosis not present

## 2015-07-21 DIAGNOSIS — N186 End stage renal disease: Secondary | ICD-10-CM | POA: Diagnosis not present

## 2015-07-21 DIAGNOSIS — D631 Anemia in chronic kidney disease: Secondary | ICD-10-CM | POA: Diagnosis not present

## 2015-07-24 DIAGNOSIS — D509 Iron deficiency anemia, unspecified: Secondary | ICD-10-CM | POA: Diagnosis not present

## 2015-07-24 DIAGNOSIS — N186 End stage renal disease: Secondary | ICD-10-CM | POA: Diagnosis not present

## 2015-07-24 DIAGNOSIS — N2581 Secondary hyperparathyroidism of renal origin: Secondary | ICD-10-CM | POA: Diagnosis not present

## 2015-07-24 DIAGNOSIS — E1121 Type 2 diabetes mellitus with diabetic nephropathy: Secondary | ICD-10-CM | POA: Diagnosis not present

## 2015-07-24 DIAGNOSIS — D631 Anemia in chronic kidney disease: Secondary | ICD-10-CM | POA: Diagnosis not present

## 2015-07-26 DIAGNOSIS — D509 Iron deficiency anemia, unspecified: Secondary | ICD-10-CM | POA: Diagnosis not present

## 2015-07-26 DIAGNOSIS — N186 End stage renal disease: Secondary | ICD-10-CM | POA: Diagnosis not present

## 2015-07-26 DIAGNOSIS — N2581 Secondary hyperparathyroidism of renal origin: Secondary | ICD-10-CM | POA: Diagnosis not present

## 2015-07-26 DIAGNOSIS — D631 Anemia in chronic kidney disease: Secondary | ICD-10-CM | POA: Diagnosis not present

## 2015-07-26 DIAGNOSIS — E1121 Type 2 diabetes mellitus with diabetic nephropathy: Secondary | ICD-10-CM | POA: Diagnosis not present

## 2015-07-28 DIAGNOSIS — N2581 Secondary hyperparathyroidism of renal origin: Secondary | ICD-10-CM | POA: Diagnosis not present

## 2015-07-28 DIAGNOSIS — D631 Anemia in chronic kidney disease: Secondary | ICD-10-CM | POA: Diagnosis not present

## 2015-07-28 DIAGNOSIS — E1121 Type 2 diabetes mellitus with diabetic nephropathy: Secondary | ICD-10-CM | POA: Diagnosis not present

## 2015-07-28 DIAGNOSIS — N186 End stage renal disease: Secondary | ICD-10-CM | POA: Diagnosis not present

## 2015-07-28 DIAGNOSIS — D509 Iron deficiency anemia, unspecified: Secondary | ICD-10-CM | POA: Diagnosis not present

## 2015-07-31 DIAGNOSIS — D631 Anemia in chronic kidney disease: Secondary | ICD-10-CM | POA: Diagnosis not present

## 2015-07-31 DIAGNOSIS — N2581 Secondary hyperparathyroidism of renal origin: Secondary | ICD-10-CM | POA: Diagnosis not present

## 2015-07-31 DIAGNOSIS — D509 Iron deficiency anemia, unspecified: Secondary | ICD-10-CM | POA: Diagnosis not present

## 2015-07-31 DIAGNOSIS — N186 End stage renal disease: Secondary | ICD-10-CM | POA: Diagnosis not present

## 2015-07-31 DIAGNOSIS — E1121 Type 2 diabetes mellitus with diabetic nephropathy: Secondary | ICD-10-CM | POA: Diagnosis not present

## 2015-08-02 DIAGNOSIS — N2581 Secondary hyperparathyroidism of renal origin: Secondary | ICD-10-CM | POA: Diagnosis not present

## 2015-08-02 DIAGNOSIS — D631 Anemia in chronic kidney disease: Secondary | ICD-10-CM | POA: Diagnosis not present

## 2015-08-02 DIAGNOSIS — N186 End stage renal disease: Secondary | ICD-10-CM | POA: Diagnosis not present

## 2015-08-02 DIAGNOSIS — E1121 Type 2 diabetes mellitus with diabetic nephropathy: Secondary | ICD-10-CM | POA: Diagnosis not present

## 2015-08-02 DIAGNOSIS — D509 Iron deficiency anemia, unspecified: Secondary | ICD-10-CM | POA: Diagnosis not present

## 2015-08-04 DIAGNOSIS — N186 End stage renal disease: Secondary | ICD-10-CM | POA: Diagnosis not present

## 2015-08-04 DIAGNOSIS — N2581 Secondary hyperparathyroidism of renal origin: Secondary | ICD-10-CM | POA: Diagnosis not present

## 2015-08-04 DIAGNOSIS — E1121 Type 2 diabetes mellitus with diabetic nephropathy: Secondary | ICD-10-CM | POA: Diagnosis not present

## 2015-08-04 DIAGNOSIS — D631 Anemia in chronic kidney disease: Secondary | ICD-10-CM | POA: Diagnosis not present

## 2015-08-04 DIAGNOSIS — D509 Iron deficiency anemia, unspecified: Secondary | ICD-10-CM | POA: Diagnosis not present

## 2015-08-07 DIAGNOSIS — D509 Iron deficiency anemia, unspecified: Secondary | ICD-10-CM | POA: Diagnosis not present

## 2015-08-07 DIAGNOSIS — N2581 Secondary hyperparathyroidism of renal origin: Secondary | ICD-10-CM | POA: Diagnosis not present

## 2015-08-07 DIAGNOSIS — N186 End stage renal disease: Secondary | ICD-10-CM | POA: Diagnosis not present

## 2015-08-07 DIAGNOSIS — E1121 Type 2 diabetes mellitus with diabetic nephropathy: Secondary | ICD-10-CM | POA: Diagnosis not present

## 2015-08-07 DIAGNOSIS — D631 Anemia in chronic kidney disease: Secondary | ICD-10-CM | POA: Diagnosis not present

## 2015-08-09 DIAGNOSIS — D631 Anemia in chronic kidney disease: Secondary | ICD-10-CM | POA: Diagnosis not present

## 2015-08-09 DIAGNOSIS — E1121 Type 2 diabetes mellitus with diabetic nephropathy: Secondary | ICD-10-CM | POA: Diagnosis not present

## 2015-08-09 DIAGNOSIS — D509 Iron deficiency anemia, unspecified: Secondary | ICD-10-CM | POA: Diagnosis not present

## 2015-08-09 DIAGNOSIS — N2581 Secondary hyperparathyroidism of renal origin: Secondary | ICD-10-CM | POA: Diagnosis not present

## 2015-08-09 DIAGNOSIS — N186 End stage renal disease: Secondary | ICD-10-CM | POA: Diagnosis not present

## 2015-08-11 DIAGNOSIS — D509 Iron deficiency anemia, unspecified: Secondary | ICD-10-CM | POA: Diagnosis not present

## 2015-08-11 DIAGNOSIS — N2581 Secondary hyperparathyroidism of renal origin: Secondary | ICD-10-CM | POA: Diagnosis not present

## 2015-08-11 DIAGNOSIS — D631 Anemia in chronic kidney disease: Secondary | ICD-10-CM | POA: Diagnosis not present

## 2015-08-11 DIAGNOSIS — E1121 Type 2 diabetes mellitus with diabetic nephropathy: Secondary | ICD-10-CM | POA: Diagnosis not present

## 2015-08-11 DIAGNOSIS — N186 End stage renal disease: Secondary | ICD-10-CM | POA: Diagnosis not present

## 2015-08-14 DIAGNOSIS — N2581 Secondary hyperparathyroidism of renal origin: Secondary | ICD-10-CM | POA: Diagnosis not present

## 2015-08-14 DIAGNOSIS — N186 End stage renal disease: Secondary | ICD-10-CM | POA: Diagnosis not present

## 2015-08-14 DIAGNOSIS — D631 Anemia in chronic kidney disease: Secondary | ICD-10-CM | POA: Diagnosis not present

## 2015-08-14 DIAGNOSIS — E1121 Type 2 diabetes mellitus with diabetic nephropathy: Secondary | ICD-10-CM | POA: Diagnosis not present

## 2015-08-14 DIAGNOSIS — D509 Iron deficiency anemia, unspecified: Secondary | ICD-10-CM | POA: Diagnosis not present

## 2015-08-15 DIAGNOSIS — E1122 Type 2 diabetes mellitus with diabetic chronic kidney disease: Secondary | ICD-10-CM | POA: Diagnosis not present

## 2015-08-15 DIAGNOSIS — N186 End stage renal disease: Secondary | ICD-10-CM | POA: Diagnosis not present

## 2015-08-15 DIAGNOSIS — Z992 Dependence on renal dialysis: Secondary | ICD-10-CM | POA: Diagnosis not present

## 2015-08-16 DIAGNOSIS — N186 End stage renal disease: Secondary | ICD-10-CM | POA: Diagnosis not present

## 2015-08-16 DIAGNOSIS — N2581 Secondary hyperparathyroidism of renal origin: Secondary | ICD-10-CM | POA: Diagnosis not present

## 2015-08-16 DIAGNOSIS — E1121 Type 2 diabetes mellitus with diabetic nephropathy: Secondary | ICD-10-CM | POA: Diagnosis not present

## 2015-08-16 DIAGNOSIS — E8770 Fluid overload, unspecified: Secondary | ICD-10-CM | POA: Diagnosis not present

## 2015-08-16 DIAGNOSIS — D631 Anemia in chronic kidney disease: Secondary | ICD-10-CM | POA: Diagnosis not present

## 2015-08-16 DIAGNOSIS — D509 Iron deficiency anemia, unspecified: Secondary | ICD-10-CM | POA: Diagnosis not present

## 2015-08-17 DIAGNOSIS — D509 Iron deficiency anemia, unspecified: Secondary | ICD-10-CM | POA: Diagnosis not present

## 2015-08-17 DIAGNOSIS — D631 Anemia in chronic kidney disease: Secondary | ICD-10-CM | POA: Diagnosis not present

## 2015-08-17 DIAGNOSIS — E1121 Type 2 diabetes mellitus with diabetic nephropathy: Secondary | ICD-10-CM | POA: Diagnosis not present

## 2015-08-17 DIAGNOSIS — N186 End stage renal disease: Secondary | ICD-10-CM | POA: Diagnosis not present

## 2015-08-17 DIAGNOSIS — E8770 Fluid overload, unspecified: Secondary | ICD-10-CM | POA: Diagnosis not present

## 2015-08-17 DIAGNOSIS — N2581 Secondary hyperparathyroidism of renal origin: Secondary | ICD-10-CM | POA: Diagnosis not present

## 2015-08-18 DIAGNOSIS — D631 Anemia in chronic kidney disease: Secondary | ICD-10-CM | POA: Diagnosis not present

## 2015-08-18 DIAGNOSIS — E1121 Type 2 diabetes mellitus with diabetic nephropathy: Secondary | ICD-10-CM | POA: Diagnosis not present

## 2015-08-18 DIAGNOSIS — N2581 Secondary hyperparathyroidism of renal origin: Secondary | ICD-10-CM | POA: Diagnosis not present

## 2015-08-18 DIAGNOSIS — E8770 Fluid overload, unspecified: Secondary | ICD-10-CM | POA: Diagnosis not present

## 2015-08-18 DIAGNOSIS — N186 End stage renal disease: Secondary | ICD-10-CM | POA: Diagnosis not present

## 2015-08-18 DIAGNOSIS — D509 Iron deficiency anemia, unspecified: Secondary | ICD-10-CM | POA: Diagnosis not present

## 2015-08-21 DIAGNOSIS — D509 Iron deficiency anemia, unspecified: Secondary | ICD-10-CM | POA: Diagnosis not present

## 2015-08-21 DIAGNOSIS — N186 End stage renal disease: Secondary | ICD-10-CM | POA: Diagnosis not present

## 2015-08-21 DIAGNOSIS — N2581 Secondary hyperparathyroidism of renal origin: Secondary | ICD-10-CM | POA: Diagnosis not present

## 2015-08-21 DIAGNOSIS — E1121 Type 2 diabetes mellitus with diabetic nephropathy: Secondary | ICD-10-CM | POA: Diagnosis not present

## 2015-08-21 DIAGNOSIS — D631 Anemia in chronic kidney disease: Secondary | ICD-10-CM | POA: Diagnosis not present

## 2015-08-21 DIAGNOSIS — E8770 Fluid overload, unspecified: Secondary | ICD-10-CM | POA: Diagnosis not present

## 2015-08-23 DIAGNOSIS — D509 Iron deficiency anemia, unspecified: Secondary | ICD-10-CM | POA: Diagnosis not present

## 2015-08-23 DIAGNOSIS — N2581 Secondary hyperparathyroidism of renal origin: Secondary | ICD-10-CM | POA: Diagnosis not present

## 2015-08-23 DIAGNOSIS — N186 End stage renal disease: Secondary | ICD-10-CM | POA: Diagnosis not present

## 2015-08-23 DIAGNOSIS — E8770 Fluid overload, unspecified: Secondary | ICD-10-CM | POA: Diagnosis not present

## 2015-08-23 DIAGNOSIS — D631 Anemia in chronic kidney disease: Secondary | ICD-10-CM | POA: Diagnosis not present

## 2015-08-23 DIAGNOSIS — E1121 Type 2 diabetes mellitus with diabetic nephropathy: Secondary | ICD-10-CM | POA: Diagnosis not present

## 2015-08-25 DIAGNOSIS — N186 End stage renal disease: Secondary | ICD-10-CM | POA: Diagnosis not present

## 2015-08-25 DIAGNOSIS — E8770 Fluid overload, unspecified: Secondary | ICD-10-CM | POA: Diagnosis not present

## 2015-08-25 DIAGNOSIS — D509 Iron deficiency anemia, unspecified: Secondary | ICD-10-CM | POA: Diagnosis not present

## 2015-08-25 DIAGNOSIS — E1121 Type 2 diabetes mellitus with diabetic nephropathy: Secondary | ICD-10-CM | POA: Diagnosis not present

## 2015-08-25 DIAGNOSIS — D631 Anemia in chronic kidney disease: Secondary | ICD-10-CM | POA: Diagnosis not present

## 2015-08-25 DIAGNOSIS — N2581 Secondary hyperparathyroidism of renal origin: Secondary | ICD-10-CM | POA: Diagnosis not present

## 2015-08-28 DIAGNOSIS — D631 Anemia in chronic kidney disease: Secondary | ICD-10-CM | POA: Diagnosis not present

## 2015-08-28 DIAGNOSIS — N2581 Secondary hyperparathyroidism of renal origin: Secondary | ICD-10-CM | POA: Diagnosis not present

## 2015-08-28 DIAGNOSIS — N186 End stage renal disease: Secondary | ICD-10-CM | POA: Diagnosis not present

## 2015-08-28 DIAGNOSIS — E1121 Type 2 diabetes mellitus with diabetic nephropathy: Secondary | ICD-10-CM | POA: Diagnosis not present

## 2015-08-28 DIAGNOSIS — E8770 Fluid overload, unspecified: Secondary | ICD-10-CM | POA: Diagnosis not present

## 2015-08-28 DIAGNOSIS — D509 Iron deficiency anemia, unspecified: Secondary | ICD-10-CM | POA: Diagnosis not present

## 2015-08-30 DIAGNOSIS — E8770 Fluid overload, unspecified: Secondary | ICD-10-CM | POA: Diagnosis not present

## 2015-08-30 DIAGNOSIS — D631 Anemia in chronic kidney disease: Secondary | ICD-10-CM | POA: Diagnosis not present

## 2015-08-30 DIAGNOSIS — E1121 Type 2 diabetes mellitus with diabetic nephropathy: Secondary | ICD-10-CM | POA: Diagnosis not present

## 2015-08-30 DIAGNOSIS — N186 End stage renal disease: Secondary | ICD-10-CM | POA: Diagnosis not present

## 2015-08-30 DIAGNOSIS — D509 Iron deficiency anemia, unspecified: Secondary | ICD-10-CM | POA: Diagnosis not present

## 2015-08-30 DIAGNOSIS — N2581 Secondary hyperparathyroidism of renal origin: Secondary | ICD-10-CM | POA: Diagnosis not present

## 2015-08-31 ENCOUNTER — Ambulatory Visit (INDEPENDENT_AMBULATORY_CARE_PROVIDER_SITE_OTHER): Payer: Medicare Other | Admitting: Family Medicine

## 2015-08-31 ENCOUNTER — Encounter: Payer: Self-pay | Admitting: Family Medicine

## 2015-08-31 VITALS — BP 142/74 | HR 80 | Temp 98.0°F | Ht 72.0 in | Wt 248.8 lb

## 2015-08-31 DIAGNOSIS — R011 Cardiac murmur, unspecified: Secondary | ICD-10-CM | POA: Diagnosis not present

## 2015-08-31 DIAGNOSIS — I509 Heart failure, unspecified: Secondary | ICD-10-CM

## 2015-08-31 DIAGNOSIS — Z992 Dependence on renal dialysis: Secondary | ICD-10-CM | POA: Diagnosis not present

## 2015-08-31 DIAGNOSIS — I10 Essential (primary) hypertension: Secondary | ICD-10-CM | POA: Diagnosis not present

## 2015-08-31 DIAGNOSIS — N186 End stage renal disease: Secondary | ICD-10-CM | POA: Diagnosis not present

## 2015-08-31 DIAGNOSIS — E119 Type 2 diabetes mellitus without complications: Secondary | ICD-10-CM

## 2015-08-31 DIAGNOSIS — E1129 Type 2 diabetes mellitus with other diabetic kidney complication: Secondary | ICD-10-CM

## 2015-08-31 DIAGNOSIS — G4733 Obstructive sleep apnea (adult) (pediatric): Secondary | ICD-10-CM | POA: Insufficient documentation

## 2015-08-31 HISTORY — DX: Type 2 diabetes mellitus with other diabetic kidney complication: E11.29

## 2015-08-31 LAB — COMPLETE METABOLIC PANEL WITH GFR
ALT: 13 U/L (ref 9–46)
AST: 12 U/L (ref 10–35)
Albumin: 4.9 g/dL (ref 3.6–5.1)
Alkaline Phosphatase: 73 U/L (ref 40–115)
BUN: 63 mg/dL — ABNORMAL HIGH (ref 7–25)
CO2: 28 mmol/L (ref 20–31)
Calcium: 9.7 mg/dL (ref 8.6–10.3)
Chloride: 94 mmol/L — ABNORMAL LOW (ref 98–110)
Creat: 9.06 mg/dL — ABNORMAL HIGH (ref 0.70–1.33)
GFR, Est African American: 7 mL/min — ABNORMAL LOW (ref >=60)
GFR, Est Non African American: 6 mL/min — ABNORMAL LOW (ref >=60)
Glucose, Bld: 64 mg/dL — ABNORMAL LOW (ref 65–99)
Potassium: 4.6 mmol/L (ref 3.5–5.3)
Sodium: 141 mmol/L (ref 135–146)
Total Bilirubin: 0.6 mg/dL (ref 0.2–1.2)
Total Protein: 8.6 g/dL — ABNORMAL HIGH (ref 6.1–8.1)

## 2015-08-31 LAB — CBC
HCT: 36 % — ABNORMAL LOW (ref 39.0–52.0)
Hemoglobin: 12 g/dL — ABNORMAL LOW (ref 13.0–17.0)
MCH: 25.6 pg — ABNORMAL LOW (ref 26.0–34.0)
MCHC: 33.3 g/dL (ref 30.0–36.0)
MCV: 76.9 fL — ABNORMAL LOW (ref 78.0–100.0)
MPV: 8.9 fL (ref 8.6–12.4)
Platelets: 240 10*3/uL (ref 150–400)
RBC: 4.68 MIL/uL (ref 4.22–5.81)
RDW: 19.4 % — ABNORMAL HIGH (ref 11.5–15.5)
WBC: 7 10*3/uL (ref 4.0–10.5)

## 2015-08-31 LAB — LIPID PANEL
Cholesterol: 152 mg/dL (ref 125–200)
HDL: 39 mg/dL — ABNORMAL LOW (ref >=40)
LDL Cholesterol: 91 mg/dL (ref <130)
Total CHOL/HDL Ratio: 3.9 Ratio (ref <=5.0)
Triglycerides: 108 mg/dL (ref <150)
VLDL: 22 mg/dL (ref <30)

## 2015-08-31 LAB — POCT GLYCOSYLATED HEMOGLOBIN (HGB A1C): Hemoglobin A1C: 5.9

## 2015-08-31 LAB — TSH: TSH: 1.59 mIU/L (ref 0.40–4.50)

## 2015-08-31 MED ORDER — CARVEDILOL 25 MG PO TABS: 25.0000 mg | ORAL_TABLET | Freq: Two times a day (BID) | ORAL | Status: DC

## 2015-08-31 NOTE — Patient Instructions (Addendum)
It was nice to meet you today.   Heart murmur/heart failure - check ultrasound of the heart, Dr. Ree Kida call you with the results.  Check labs.   High Blood Pressure - stop Nifedipine, increase Coreg to 25 mg twice daily.   Return to office for yearly physical and Blood Pressure check in 3-4 weeks.

## 2015-08-31 NOTE — Progress Notes (Signed)
   Subjective:    Patient ID: Troy Wells, male    DOB: [birth year], 50 y.o.   MRN: [id number]  HPI 50 y/o male presents to establish care.  Previous PCP: No recent PCP, needs a PCP as on renal transplant list  Reviewed and Updated EPIC as appropriate for PMH/PSH/Allergies/Social/Meds/FM.   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 7 years ago, no swelling, no shortness of breath, takes Torsemide 100 mg four times per week  HTN - not taking Nifedipine due to cost (insurance not covering), on Amlodipine, Norvasc, Coreg, and Minoxidil  ESRD - on HD M/W/F, on kidney transplant list, Renal doctors added to overview section of ESRD  Social - updated in [place], former smoker (quit 2014)   Review of Systems  Constitutional: Negative for fever, chills and fatigue.  Respiratory: Negative for cough and shortness of breath.   Cardiovascular: Negative for chest pain.  Gastrointestinal: Negative for nausea, vomiting and diarrhea.       Objective:   Physical Exam BP 142/74 mmHg  Pulse 80  Temp(Src) 98 F (36.7 C) (Oral)  Ht 6' (1.829 m)  Wt 248 lb 12.8 oz (112.855 kg)  BMI 33.74 kg/m2  Gen: pleasant male, NAD, obese HEENT: normocephalic, PERRL, EOMI, no scleral icterus Cardiac: RRR, S1 and S2 present, 3/6 systolic murmur heard best over right 2nd ICS Resp: CTAB, normal effort Abd: soft, no tenderness, NBS Ext: AV graft of left arm, trace pedal edema     Assessment & Plan:  Essential hypertension Blood pressure not at goal per JNC 8 guidelines. -stop Nifedipine (has not been taking) as already on CCB and needs prior auth -increase Coreg to 25 mg BID  Congestive heart failure ([place]) Unknown CHF type. No former Echo. No current symptoms. -check Echo -continue BB, not a candidate for ACE-I due to ESRD -continue Torsemide four days per week (days when not  getting HD)  Diabetes mellitus without complication ([place]) History of DM in chart. No recent A1C. -check A1C  End-stage renal disease on hemodialysis ([place]) ESRD on HD M/W/F. Management per Dr. Zigmund Gottron and Dr Joelyn Oms.

## 2015-09-01 ENCOUNTER — Encounter: Payer: Self-pay | Admitting: Family Medicine

## 2015-09-01 DIAGNOSIS — D631 Anemia in chronic kidney disease: Secondary | ICD-10-CM | POA: Diagnosis not present

## 2015-09-01 DIAGNOSIS — E1121 Type 2 diabetes mellitus with diabetic nephropathy: Secondary | ICD-10-CM | POA: Diagnosis not present

## 2015-09-01 DIAGNOSIS — D509 Iron deficiency anemia, unspecified: Secondary | ICD-10-CM | POA: Diagnosis not present

## 2015-09-01 DIAGNOSIS — R011 Cardiac murmur, unspecified: Secondary | ICD-10-CM | POA: Insufficient documentation

## 2015-09-01 DIAGNOSIS — N186 End stage renal disease: Secondary | ICD-10-CM | POA: Diagnosis not present

## 2015-09-01 DIAGNOSIS — N2581 Secondary hyperparathyroidism of renal origin: Secondary | ICD-10-CM | POA: Diagnosis not present

## 2015-09-01 DIAGNOSIS — E8770 Fluid overload, unspecified: Secondary | ICD-10-CM | POA: Diagnosis not present

## 2015-09-01 NOTE — Assessment & Plan Note (Signed)
Blood pressure not at goal per JNC 8 guidelines. -stop Nifedipine (has not been taking) as already on CCB and needs prior auth -increase Coreg to 25 mg BID

## 2015-09-01 NOTE — Assessment & Plan Note (Signed)
History of DM in chart. No recent A1C. -check A1C

## 2015-09-01 NOTE — Assessment & Plan Note (Signed)
ESRD on HD M/W/F. Management per Dr. Zigmund Gottron and Dr Joelyn Oms.

## 2015-09-01 NOTE — Assessment & Plan Note (Signed)
Unknown CHF type. No former Echo. No current symptoms. -check Echo -continue BB, not a candidate for ACE-I due to ESRD -continue Torsemide four days per week (days when not getting HD)

## 2015-09-01 NOTE — Assessment & Plan Note (Signed)
New systolic heart murmur (not previously noted in EPIC) -check Echo

## 2015-09-04 DIAGNOSIS — N186 End stage renal disease: Secondary | ICD-10-CM | POA: Diagnosis not present

## 2015-09-04 DIAGNOSIS — D509 Iron deficiency anemia, unspecified: Secondary | ICD-10-CM | POA: Diagnosis not present

## 2015-09-04 DIAGNOSIS — E8770 Fluid overload, unspecified: Secondary | ICD-10-CM | POA: Diagnosis not present

## 2015-09-04 DIAGNOSIS — D631 Anemia in chronic kidney disease: Secondary | ICD-10-CM | POA: Diagnosis not present

## 2015-09-04 DIAGNOSIS — N2581 Secondary hyperparathyroidism of renal origin: Secondary | ICD-10-CM | POA: Diagnosis not present

## 2015-09-04 DIAGNOSIS — E1121 Type 2 diabetes mellitus with diabetic nephropathy: Secondary | ICD-10-CM | POA: Diagnosis not present

## 2015-09-05 ENCOUNTER — Telehealth: Payer: Self-pay | Admitting: Family Medicine

## 2015-09-05 DIAGNOSIS — E785 Hyperlipidemia, unspecified: Secondary | ICD-10-CM

## 2015-09-05 DIAGNOSIS — R778 Other specified abnormalities of plasma proteins: Secondary | ICD-10-CM

## 2015-09-05 NOTE — Telephone Encounter (Signed)
Attempted to contact patient to discuss labs. Left message that I would return call again. ASCVD calculated at 10.5%.

## 2015-09-06 DIAGNOSIS — N186 End stage renal disease: Secondary | ICD-10-CM | POA: Diagnosis not present

## 2015-09-06 DIAGNOSIS — D631 Anemia in chronic kidney disease: Secondary | ICD-10-CM | POA: Diagnosis not present

## 2015-09-06 DIAGNOSIS — N2581 Secondary hyperparathyroidism of renal origin: Secondary | ICD-10-CM | POA: Diagnosis not present

## 2015-09-06 DIAGNOSIS — E1121 Type 2 diabetes mellitus with diabetic nephropathy: Secondary | ICD-10-CM | POA: Diagnosis not present

## 2015-09-06 DIAGNOSIS — R778 Other specified abnormalities of plasma proteins: Secondary | ICD-10-CM | POA: Insufficient documentation

## 2015-09-06 DIAGNOSIS — E8770 Fluid overload, unspecified: Secondary | ICD-10-CM | POA: Diagnosis not present

## 2015-09-06 DIAGNOSIS — E785 Hyperlipidemia, unspecified: Secondary | ICD-10-CM | POA: Insufficient documentation

## 2015-09-06 DIAGNOSIS — D509 Iron deficiency anemia, unspecified: Secondary | ICD-10-CM | POA: Diagnosis not present

## 2015-09-06 MED ORDER — ATORVASTATIN CALCIUM 40 MG PO TABS: 40.0000 mg | ORAL_TABLET | Freq: Every day | ORAL | Status: DC

## 2015-09-06 NOTE — Telephone Encounter (Signed)
Discussed normal TSH and A1C. BMP abnormal due to ESRD however total protein also elevated (will get renal records at next appointment). Given history of MI will increase cholesterol med to Lipitor 40 mg daily.

## 2015-09-08 DIAGNOSIS — E1121 Type 2 diabetes mellitus with diabetic nephropathy: Secondary | ICD-10-CM | POA: Diagnosis not present

## 2015-09-08 DIAGNOSIS — E8770 Fluid overload, unspecified: Secondary | ICD-10-CM | POA: Diagnosis not present

## 2015-09-08 DIAGNOSIS — D509 Iron deficiency anemia, unspecified: Secondary | ICD-10-CM | POA: Diagnosis not present

## 2015-09-08 DIAGNOSIS — N186 End stage renal disease: Secondary | ICD-10-CM | POA: Diagnosis not present

## 2015-09-08 DIAGNOSIS — N2581 Secondary hyperparathyroidism of renal origin: Secondary | ICD-10-CM | POA: Diagnosis not present

## 2015-09-08 DIAGNOSIS — D631 Anemia in chronic kidney disease: Secondary | ICD-10-CM | POA: Diagnosis not present

## 2015-09-11 DIAGNOSIS — N186 End stage renal disease: Secondary | ICD-10-CM | POA: Diagnosis not present

## 2015-09-11 DIAGNOSIS — E8770 Fluid overload, unspecified: Secondary | ICD-10-CM | POA: Diagnosis not present

## 2015-09-11 DIAGNOSIS — D509 Iron deficiency anemia, unspecified: Secondary | ICD-10-CM | POA: Diagnosis not present

## 2015-09-11 DIAGNOSIS — D631 Anemia in chronic kidney disease: Secondary | ICD-10-CM | POA: Diagnosis not present

## 2015-09-11 DIAGNOSIS — E1121 Type 2 diabetes mellitus with diabetic nephropathy: Secondary | ICD-10-CM | POA: Diagnosis not present

## 2015-09-11 DIAGNOSIS — N2581 Secondary hyperparathyroidism of renal origin: Secondary | ICD-10-CM | POA: Diagnosis not present

## 2015-09-13 DIAGNOSIS — D509 Iron deficiency anemia, unspecified: Secondary | ICD-10-CM | POA: Diagnosis not present

## 2015-09-13 DIAGNOSIS — N2581 Secondary hyperparathyroidism of renal origin: Secondary | ICD-10-CM | POA: Diagnosis not present

## 2015-09-13 DIAGNOSIS — D631 Anemia in chronic kidney disease: Secondary | ICD-10-CM | POA: Diagnosis not present

## 2015-09-13 DIAGNOSIS — N186 End stage renal disease: Secondary | ICD-10-CM | POA: Diagnosis not present

## 2015-09-13 DIAGNOSIS — E1121 Type 2 diabetes mellitus with diabetic nephropathy: Secondary | ICD-10-CM | POA: Diagnosis not present

## 2015-09-13 DIAGNOSIS — E8770 Fluid overload, unspecified: Secondary | ICD-10-CM | POA: Diagnosis not present

## 2015-09-15 DIAGNOSIS — E8770 Fluid overload, unspecified: Secondary | ICD-10-CM | POA: Diagnosis not present

## 2015-09-15 DIAGNOSIS — Z992 Dependence on renal dialysis: Secondary | ICD-10-CM | POA: Diagnosis not present

## 2015-09-15 DIAGNOSIS — E1122 Type 2 diabetes mellitus with diabetic chronic kidney disease: Secondary | ICD-10-CM | POA: Diagnosis not present

## 2015-09-15 DIAGNOSIS — N2581 Secondary hyperparathyroidism of renal origin: Secondary | ICD-10-CM | POA: Diagnosis not present

## 2015-09-15 DIAGNOSIS — N186 End stage renal disease: Secondary | ICD-10-CM | POA: Diagnosis not present

## 2015-09-15 DIAGNOSIS — D631 Anemia in chronic kidney disease: Secondary | ICD-10-CM | POA: Diagnosis not present

## 2015-09-15 DIAGNOSIS — E1121 Type 2 diabetes mellitus with diabetic nephropathy: Secondary | ICD-10-CM | POA: Diagnosis not present

## 2015-09-15 DIAGNOSIS — D509 Iron deficiency anemia, unspecified: Secondary | ICD-10-CM | POA: Diagnosis not present

## 2015-09-18 ENCOUNTER — Ambulatory Visit (HOSPITAL_COMMUNITY): Payer: Medicare Other | Attending: Cardiovascular Disease

## 2015-09-18 ENCOUNTER — Other Ambulatory Visit: Payer: Self-pay

## 2015-09-18 DIAGNOSIS — E8770 Fluid overload, unspecified: Secondary | ICD-10-CM | POA: Diagnosis not present

## 2015-09-18 DIAGNOSIS — E1121 Type 2 diabetes mellitus with diabetic nephropathy: Secondary | ICD-10-CM | POA: Diagnosis not present

## 2015-09-18 DIAGNOSIS — I509 Heart failure, unspecified: Secondary | ICD-10-CM | POA: Diagnosis not present

## 2015-09-18 DIAGNOSIS — D631 Anemia in chronic kidney disease: Secondary | ICD-10-CM | POA: Diagnosis not present

## 2015-09-18 DIAGNOSIS — E119 Type 2 diabetes mellitus without complications: Secondary | ICD-10-CM | POA: Diagnosis not present

## 2015-09-18 DIAGNOSIS — I110 Hypertensive heart disease with heart failure: Secondary | ICD-10-CM | POA: Diagnosis not present

## 2015-09-18 DIAGNOSIS — D509 Iron deficiency anemia, unspecified: Secondary | ICD-10-CM | POA: Diagnosis not present

## 2015-09-18 DIAGNOSIS — N186 End stage renal disease: Secondary | ICD-10-CM | POA: Diagnosis not present

## 2015-09-18 DIAGNOSIS — I340 Nonrheumatic mitral (valve) insufficiency: Secondary | ICD-10-CM | POA: Insufficient documentation

## 2015-09-18 DIAGNOSIS — N2581 Secondary hyperparathyroidism of renal origin: Secondary | ICD-10-CM | POA: Diagnosis not present

## 2015-09-19 ENCOUNTER — Telehealth: Payer: Self-pay | Admitting: Family Medicine

## 2015-09-19 DIAGNOSIS — I509 Heart failure, unspecified: Secondary | ICD-10-CM

## 2015-09-19 DIAGNOSIS — I340 Nonrheumatic mitral (valve) insufficiency: Secondary | ICD-10-CM | POA: Insufficient documentation

## 2015-09-19 DIAGNOSIS — I517 Cardiomegaly: Secondary | ICD-10-CM

## 2015-09-19 HISTORY — DX: Nonrheumatic mitral (valve) insufficiency: I34.0

## 2015-09-19 NOTE — Telephone Encounter (Signed)
Spoke to patient about Echo results. EF [id number], no diastolic dysfunction, mitral vale regurgitation and left atrial dilation. Patient does not have a heart doctor in [place], will place referral.

## 2015-09-20 ENCOUNTER — Telehealth: Payer: Self-pay | Admitting: Family Medicine

## 2015-09-20 DIAGNOSIS — N186 End stage renal disease: Secondary | ICD-10-CM | POA: Diagnosis not present

## 2015-09-20 DIAGNOSIS — D631 Anemia in chronic kidney disease: Secondary | ICD-10-CM | POA: Diagnosis not present

## 2015-09-20 DIAGNOSIS — N2581 Secondary hyperparathyroidism of renal origin: Secondary | ICD-10-CM | POA: Diagnosis not present

## 2015-09-20 DIAGNOSIS — D509 Iron deficiency anemia, unspecified: Secondary | ICD-10-CM | POA: Diagnosis not present

## 2015-09-20 DIAGNOSIS — E8770 Fluid overload, unspecified: Secondary | ICD-10-CM | POA: Diagnosis not present

## 2015-09-20 DIAGNOSIS — E1121 Type 2 diabetes mellitus with diabetic nephropathy: Secondary | ICD-10-CM | POA: Diagnosis not present

## 2015-09-20 NOTE — Telephone Encounter (Signed)
Patient dropped off form to be filled out for employer.  Please call when completed.

## 2015-09-22 DIAGNOSIS — E1121 Type 2 diabetes mellitus with diabetic nephropathy: Secondary | ICD-10-CM | POA: Diagnosis not present

## 2015-09-22 DIAGNOSIS — N2581 Secondary hyperparathyroidism of renal origin: Secondary | ICD-10-CM | POA: Diagnosis not present

## 2015-09-22 DIAGNOSIS — D631 Anemia in chronic kidney disease: Secondary | ICD-10-CM | POA: Diagnosis not present

## 2015-09-22 DIAGNOSIS — E8770 Fluid overload, unspecified: Secondary | ICD-10-CM | POA: Diagnosis not present

## 2015-09-22 DIAGNOSIS — D509 Iron deficiency anemia, unspecified: Secondary | ICD-10-CM | POA: Diagnosis not present

## 2015-09-22 DIAGNOSIS — N186 End stage renal disease: Secondary | ICD-10-CM | POA: Diagnosis not present

## 2015-09-22 NOTE — Telephone Encounter (Signed)
Patient informed, expressed understanding. 

## 2015-09-22 NOTE — Telephone Encounter (Signed)
Form completed. Copy made and placed in scan box. Original placed in front office for pickup. Please notify patient.

## 2015-09-22 NOTE — Telephone Encounter (Signed)
Form placed in PCP box 

## 2015-09-23 DIAGNOSIS — D631 Anemia in chronic kidney disease: Secondary | ICD-10-CM | POA: Diagnosis not present

## 2015-09-23 DIAGNOSIS — D509 Iron deficiency anemia, unspecified: Secondary | ICD-10-CM | POA: Diagnosis not present

## 2015-09-23 DIAGNOSIS — N186 End stage renal disease: Secondary | ICD-10-CM | POA: Diagnosis not present

## 2015-09-23 DIAGNOSIS — E8770 Fluid overload, unspecified: Secondary | ICD-10-CM | POA: Diagnosis not present

## 2015-09-23 DIAGNOSIS — N2581 Secondary hyperparathyroidism of renal origin: Secondary | ICD-10-CM | POA: Diagnosis not present

## 2015-09-23 DIAGNOSIS — E1121 Type 2 diabetes mellitus with diabetic nephropathy: Secondary | ICD-10-CM | POA: Diagnosis not present

## 2015-09-25 DIAGNOSIS — D509 Iron deficiency anemia, unspecified: Secondary | ICD-10-CM | POA: Diagnosis not present

## 2015-09-25 DIAGNOSIS — E1121 Type 2 diabetes mellitus with diabetic nephropathy: Secondary | ICD-10-CM | POA: Diagnosis not present

## 2015-09-25 DIAGNOSIS — N186 End stage renal disease: Secondary | ICD-10-CM | POA: Diagnosis not present

## 2015-09-25 DIAGNOSIS — E8770 Fluid overload, unspecified: Secondary | ICD-10-CM | POA: Diagnosis not present

## 2015-09-25 DIAGNOSIS — D631 Anemia in chronic kidney disease: Secondary | ICD-10-CM | POA: Diagnosis not present

## 2015-09-25 DIAGNOSIS — N2581 Secondary hyperparathyroidism of renal origin: Secondary | ICD-10-CM | POA: Diagnosis not present

## 2015-09-27 DIAGNOSIS — D631 Anemia in chronic kidney disease: Secondary | ICD-10-CM | POA: Diagnosis not present

## 2015-09-27 DIAGNOSIS — N2581 Secondary hyperparathyroidism of renal origin: Secondary | ICD-10-CM | POA: Diagnosis not present

## 2015-09-27 DIAGNOSIS — E1121 Type 2 diabetes mellitus with diabetic nephropathy: Secondary | ICD-10-CM | POA: Diagnosis not present

## 2015-09-27 DIAGNOSIS — E8770 Fluid overload, unspecified: Secondary | ICD-10-CM | POA: Diagnosis not present

## 2015-09-27 DIAGNOSIS — D509 Iron deficiency anemia, unspecified: Secondary | ICD-10-CM | POA: Diagnosis not present

## 2015-09-27 DIAGNOSIS — N186 End stage renal disease: Secondary | ICD-10-CM | POA: Diagnosis not present

## 2015-09-28 ENCOUNTER — Encounter: Payer: Medicaid Other | Admitting: Family Medicine

## 2015-09-28 DIAGNOSIS — E113293 Type 2 diabetes mellitus with mild nonproliferative diabetic retinopathy without macular edema, bilateral: Secondary | ICD-10-CM | POA: Diagnosis not present

## 2015-09-29 DIAGNOSIS — E1121 Type 2 diabetes mellitus with diabetic nephropathy: Secondary | ICD-10-CM | POA: Diagnosis not present

## 2015-09-29 DIAGNOSIS — N186 End stage renal disease: Secondary | ICD-10-CM | POA: Diagnosis not present

## 2015-09-29 DIAGNOSIS — D631 Anemia in chronic kidney disease: Secondary | ICD-10-CM | POA: Diagnosis not present

## 2015-09-29 DIAGNOSIS — N2581 Secondary hyperparathyroidism of renal origin: Secondary | ICD-10-CM | POA: Diagnosis not present

## 2015-09-29 DIAGNOSIS — E8770 Fluid overload, unspecified: Secondary | ICD-10-CM | POA: Diagnosis not present

## 2015-09-29 DIAGNOSIS — D509 Iron deficiency anemia, unspecified: Secondary | ICD-10-CM | POA: Diagnosis not present

## 2015-10-02 DIAGNOSIS — N186 End stage renal disease: Secondary | ICD-10-CM | POA: Diagnosis not present

## 2015-10-02 DIAGNOSIS — N2581 Secondary hyperparathyroidism of renal origin: Secondary | ICD-10-CM | POA: Diagnosis not present

## 2015-10-02 DIAGNOSIS — E8770 Fluid overload, unspecified: Secondary | ICD-10-CM | POA: Diagnosis not present

## 2015-10-02 DIAGNOSIS — D631 Anemia in chronic kidney disease: Secondary | ICD-10-CM | POA: Diagnosis not present

## 2015-10-02 DIAGNOSIS — D509 Iron deficiency anemia, unspecified: Secondary | ICD-10-CM | POA: Diagnosis not present

## 2015-10-02 DIAGNOSIS — E1121 Type 2 diabetes mellitus with diabetic nephropathy: Secondary | ICD-10-CM | POA: Diagnosis not present

## 2015-10-03 ENCOUNTER — Ambulatory Visit (INDEPENDENT_AMBULATORY_CARE_PROVIDER_SITE_OTHER): Payer: Medicare Other | Admitting: Cardiology

## 2015-10-03 ENCOUNTER — Encounter: Payer: Self-pay | Admitting: Cardiology

## 2015-10-03 VITALS — BP 128/74 | HR 83 | Ht 72.0 in | Wt 258.0 lb

## 2015-10-03 DIAGNOSIS — R011 Cardiac murmur, unspecified: Secondary | ICD-10-CM

## 2015-10-03 DIAGNOSIS — I340 Nonrheumatic mitral (valve) insufficiency: Secondary | ICD-10-CM | POA: Diagnosis not present

## 2015-10-03 DIAGNOSIS — E785 Hyperlipidemia, unspecified: Secondary | ICD-10-CM | POA: Diagnosis not present

## 2015-10-03 DIAGNOSIS — I110 Hypertensive heart disease with heart failure: Secondary | ICD-10-CM

## 2015-10-03 DIAGNOSIS — I10 Essential (primary) hypertension: Secondary | ICD-10-CM

## 2015-10-03 DIAGNOSIS — R0609 Other forms of dyspnea: Secondary | ICD-10-CM

## 2015-10-03 DIAGNOSIS — E1159 Type 2 diabetes mellitus with other circulatory complications: Secondary | ICD-10-CM | POA: Insufficient documentation

## 2015-10-03 DIAGNOSIS — I119 Hypertensive heart disease without heart failure: Secondary | ICD-10-CM

## 2015-10-03 NOTE — Patient Instructions (Addendum)
Medication Instructions:   Your physician recommends that you continue on your current medications as directed. Please refer to the Current Medication list given to you today.    PLEASE SIGN A RELEASE OF MEDICAL INFORMATION AT [facility] ON THIS Thursday, WHEN YOU HAVE YOUR STRESS TEST THERE AND HAVE THEM FAX YOUR RESULTS TO OUR OFFICE AT [phone] ATTENTION Tjuana Vickrey AND DR Meda Coffee.      Follow-Up:  Your physician wants you to follow-up in: [place] will receive a reminder letter in the mail two months in advance. If you don't receive a letter, please call our office to schedule the follow-up appointment.        If you need a refill on your cardiac medications before your next appointment, please call your pharmacy.

## 2015-10-03 NOTE — Progress Notes (Signed)
Cardiology Office Note  Date:  10/03/2015   ID:  Troy Wells, DOB Nov 05, 1965, MRN [id number]  PCP:  [name], MD  Cardiologist:  [name], MD  Referring physician: [name], MD    History of Present Illness: Troy Wells is a 50 y.o. male who presents for evaluation of heart murmur and dyspnea on exertion. This is of patient with long-standing poorly controlled hypertension end-stage renal disease on hemodialysis that was started in April 2016. His end-stage renal disease as a consequence of poorly controlled hypertension. The patient was found to have cardiac murmur and was referred to [place]. He says that he gets short of breath only on significant exertion. 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 He denies any chest pain on exertion no palpitations or syncope. He's compliant with his meds. His mother had heart attack at age of 31. He quit smoking 6 years ago. No lower extremity edema, orthopnea or proximal nocturnal dyspnea. He is using Demadex and still makes urine.   Past Medical History  Diagnosis Date  . Diabetes mellitus without complication ([name])   . Hypertension   . Chronic kidney disease     Chronic Renal Insufficiency  . Myocardial infarction ([name])   . Coronary artery disease 08/15/2009    with AMI  . OSA treated with BiPAP 06/17/2010    Past Surgical History  Procedure Laterality Date  . Av fistula placement Left      Current Outpatient Prescriptions  Medication Sig Dispense Refill  . acetaminophen (TYLENOL) 500 MG tablet Take 500 mg by mouth every 6 (six) hours as needed for mild pain or moderate pain.    Marland Kitchen allopurinol (ZYLOPRIM) 100 MG tablet Take 100 mg by mouth daily.    Marland Kitchen amLODipine (NORVASC) 10 MG tablet Take 10 mg by mouth daily.    Marland Kitchen aspirin 81 MG tablet Take 81 mg by mouth daily.    Marland Kitchen atorvastatin (LIPITOR) 40 MG tablet Take 1 tablet (40 mg total) by mouth daily. 90 tablet 1  . carvedilol (COREG) 25 MG  tablet Take 1 tablet (25 mg total) by mouth 2 (two) times daily with a meal. 60 tablet 2  . colchicine 0.6 MG tablet Take 0.6 mg by mouth 2 (two) times daily.    Marland Kitchen gabapentin (NEURONTIN) 300 MG capsule Take 300 mg by mouth daily.     Marland Kitchen glipiZIDE (GLUCOTROL) 5 MG tablet Take 5 mg by mouth daily.    . minoxidil (LONITEN) 10 MG tablet Take 10 mg by mouth 2 (two) times daily.    . sevelamer carbonate (RENVELA) 800 MG tablet Take 2,400 mg by mouth 3 (three) times daily with meals.    . torsemide (DEMADEX) 100 MG tablet Take 100 mg by mouth daily. Take on Tuesday, Thursday, Saturday, and Sunday     No current facility-administered medications for this visit.    Allergies:   Review of patient's allergies indicates no known allergies.    Social History:  The patient  reports that he quit smoking about 4 years ago. His smoking use included Cigars. He has never used smokeless tobacco. He reports that he drinks alcohol. He reports that he does not use illicit drugs.   Family History:  The patient's family history includes Alcohol abuse in his brother; Aneurysm (age of onset: 16) in his father; Heart disease (age of onset: 44) in his mother; Heart disease (age of onset: 59) in his father;  Hypertension in his brother, father, and mother.   ROS:  Please see the history of present illness.   Otherwise, review of systems are positive for none.   All other systems are reviewed and negative.   PHYSICAL EXAM: VS:  BP 128/74 mmHg  Pulse 83  Ht 6' (1.829 m)  Wt 258 lb (117.028 kg)  BMI 34.98 kg/m2 , BMI Body mass index is 34.98 kg/(m^2). GEN: Well nourished, well developed, in no acute distress HEENT: normal Neck: no JVD, carotid bruits, or masses Cardiac: RRR; 3/6 holomurmurs, rubs, or gallops,no edema  Respiratory:  clear to auscultation bilaterally, normal work of breathing GI: soft, nontender, nondistended, + BS MS: no deformity or atrophy Skin: warm and dry, no rash Neuro:  Strength and sensation  are intact Psych: euthymic mood, full affect  EKG:  EKG is ordered today. The ekg ordered today demonstrates sinus rhythm, left atrial enlargement, ST depressions and negative T waves in inferolateral leads, these are new when compared to prior EKG performed in 2011.  Recent Labs: 08/31/2015: ALT 13; BUN 63*; Creat 9.06*; Hemoglobin 12.0*; Platelets 240; Potassium 4.6; Sodium 141; TSH 1.59   Lipid Panel    Component Value Date/Time   CHOL 152 08/31/2015 1041   TRIG 108 08/31/2015 1041   HDL 39* 08/31/2015 1041   CHOLHDL 3.9 08/31/2015 1041   VLDL 22 08/31/2015 1041   LDLCALC 91 08/31/2015 1041   Wt Readings from Last 3 Encounters:  10/03/15 258 lb (117.028 kg)  08/31/15 248 lb 12.8 oz (112.855 kg)  04/17/15 240 lb (108.863 kg)     TTE: 09/18/2015 - Left ventricle: The cavity size was mildly dilated. There was  moderate concentric hypertrophy. Systolic function was normal.  The estimated ejection fraction was in the range of 60% to 65%.  Wall motion was normal; there were no regional wall motion  abnormalities. - Mitral valve: There was mild regurgitation. - Left atrium: The atrium was moderately to severely dilated. - Pulmonary arteries: Systolic pressure was mildly increased. PA  peak pressure: 38 mm Hg (S).  Impressions:  - Increased velocities are seen across all valves, likely  responsible for the reported murmur. Consider anemia,  thyrotoxicosis, AV fistula, etc. Consider high output cardiac  failure.    ASSESSMENT AND PLAN:  1. Murmur - the patient has only mild mitral and mild tricuspid regurgitation on the echocardiogram. The patient also has an AV fistula for his hemodialysis that is causing significant increase in velocities across the cardiac valve that is resulting physiologic murmur.  2. Dyspnea on exertion - patient states that he is symptomatic only on significant exertion, however his EKG shows new ST depression and inverted T waves in the  inferolateral leads not present on EKG in 2011, therefore we will schedule an exercise treadmill nuclear stress test to rule out ischemia.  3. Hypertensive heart disease without heart failure - he is echocardiogram shows moderate concentric LVH, his blood pressure is currently well controlled on combination of amlodipine and carvedilol I would continue the same management.  4. Hyperlipidemia, I agree with 40 mg of atorvastatin considering he is diabetic person.  Follow-up in 6 months if his stress test is normal.  Signed, [name], MD  10/03/2015 2:57 PM    [facility] [address], [place], [place]  [address] Phone: [phone]; Fax: [phone]

## 2015-10-04 DIAGNOSIS — D631 Anemia in chronic kidney disease: Secondary | ICD-10-CM | POA: Diagnosis not present

## 2015-10-04 DIAGNOSIS — D509 Iron deficiency anemia, unspecified: Secondary | ICD-10-CM | POA: Diagnosis not present

## 2015-10-04 DIAGNOSIS — N2581 Secondary hyperparathyroidism of renal origin: Secondary | ICD-10-CM | POA: Diagnosis not present

## 2015-10-04 DIAGNOSIS — E1121 Type 2 diabetes mellitus with diabetic nephropathy: Secondary | ICD-10-CM | POA: Diagnosis not present

## 2015-10-04 DIAGNOSIS — N186 End stage renal disease: Secondary | ICD-10-CM | POA: Diagnosis not present

## 2015-10-04 DIAGNOSIS — E8770 Fluid overload, unspecified: Secondary | ICD-10-CM | POA: Diagnosis not present

## 2015-10-06 DIAGNOSIS — N186 End stage renal disease: Secondary | ICD-10-CM | POA: Diagnosis not present

## 2015-10-06 DIAGNOSIS — E1121 Type 2 diabetes mellitus with diabetic nephropathy: Secondary | ICD-10-CM | POA: Diagnosis not present

## 2015-10-06 DIAGNOSIS — E8770 Fluid overload, unspecified: Secondary | ICD-10-CM | POA: Diagnosis not present

## 2015-10-06 DIAGNOSIS — D509 Iron deficiency anemia, unspecified: Secondary | ICD-10-CM | POA: Diagnosis not present

## 2015-10-06 DIAGNOSIS — D631 Anemia in chronic kidney disease: Secondary | ICD-10-CM | POA: Diagnosis not present

## 2015-10-06 DIAGNOSIS — N2581 Secondary hyperparathyroidism of renal origin: Secondary | ICD-10-CM | POA: Diagnosis not present

## 2015-10-07 DIAGNOSIS — E8770 Fluid overload, unspecified: Secondary | ICD-10-CM | POA: Diagnosis not present

## 2015-10-07 DIAGNOSIS — N2581 Secondary hyperparathyroidism of renal origin: Secondary | ICD-10-CM | POA: Diagnosis not present

## 2015-10-07 DIAGNOSIS — D631 Anemia in chronic kidney disease: Secondary | ICD-10-CM | POA: Diagnosis not present

## 2015-10-07 DIAGNOSIS — N186 End stage renal disease: Secondary | ICD-10-CM | POA: Diagnosis not present

## 2015-10-07 DIAGNOSIS — D509 Iron deficiency anemia, unspecified: Secondary | ICD-10-CM | POA: Diagnosis not present

## 2015-10-07 DIAGNOSIS — E1121 Type 2 diabetes mellitus with diabetic nephropathy: Secondary | ICD-10-CM | POA: Diagnosis not present

## 2015-10-09 DIAGNOSIS — E1121 Type 2 diabetes mellitus with diabetic nephropathy: Secondary | ICD-10-CM | POA: Diagnosis not present

## 2015-10-09 DIAGNOSIS — D631 Anemia in chronic kidney disease: Secondary | ICD-10-CM | POA: Diagnosis not present

## 2015-10-09 DIAGNOSIS — N2581 Secondary hyperparathyroidism of renal origin: Secondary | ICD-10-CM | POA: Diagnosis not present

## 2015-10-09 DIAGNOSIS — D509 Iron deficiency anemia, unspecified: Secondary | ICD-10-CM | POA: Diagnosis not present

## 2015-10-09 DIAGNOSIS — N186 End stage renal disease: Secondary | ICD-10-CM | POA: Diagnosis not present

## 2015-10-09 DIAGNOSIS — E8770 Fluid overload, unspecified: Secondary | ICD-10-CM | POA: Diagnosis not present

## 2015-10-11 DIAGNOSIS — E1121 Type 2 diabetes mellitus with diabetic nephropathy: Secondary | ICD-10-CM | POA: Diagnosis not present

## 2015-10-11 DIAGNOSIS — D631 Anemia in chronic kidney disease: Secondary | ICD-10-CM | POA: Diagnosis not present

## 2015-10-11 DIAGNOSIS — E8770 Fluid overload, unspecified: Secondary | ICD-10-CM | POA: Diagnosis not present

## 2015-10-11 DIAGNOSIS — N186 End stage renal disease: Secondary | ICD-10-CM | POA: Diagnosis not present

## 2015-10-11 DIAGNOSIS — N2581 Secondary hyperparathyroidism of renal origin: Secondary | ICD-10-CM | POA: Diagnosis not present

## 2015-10-11 DIAGNOSIS — D509 Iron deficiency anemia, unspecified: Secondary | ICD-10-CM | POA: Diagnosis not present

## 2015-10-13 DIAGNOSIS — E1121 Type 2 diabetes mellitus with diabetic nephropathy: Secondary | ICD-10-CM | POA: Diagnosis not present

## 2015-10-13 DIAGNOSIS — N186 End stage renal disease: Secondary | ICD-10-CM | POA: Diagnosis not present

## 2015-10-13 DIAGNOSIS — N2581 Secondary hyperparathyroidism of renal origin: Secondary | ICD-10-CM | POA: Diagnosis not present

## 2015-10-13 DIAGNOSIS — E8770 Fluid overload, unspecified: Secondary | ICD-10-CM | POA: Diagnosis not present

## 2015-10-13 DIAGNOSIS — D631 Anemia in chronic kidney disease: Secondary | ICD-10-CM | POA: Diagnosis not present

## 2015-10-13 DIAGNOSIS — D509 Iron deficiency anemia, unspecified: Secondary | ICD-10-CM | POA: Diagnosis not present

## 2015-10-15 DIAGNOSIS — Z992 Dependence on renal dialysis: Secondary | ICD-10-CM | POA: Diagnosis not present

## 2015-10-15 DIAGNOSIS — N186 End stage renal disease: Secondary | ICD-10-CM | POA: Diagnosis not present

## 2015-10-15 DIAGNOSIS — E1122 Type 2 diabetes mellitus with diabetic chronic kidney disease: Secondary | ICD-10-CM | POA: Diagnosis not present

## 2015-10-16 DIAGNOSIS — D509 Iron deficiency anemia, unspecified: Secondary | ICD-10-CM | POA: Diagnosis not present

## 2015-10-16 DIAGNOSIS — E1121 Type 2 diabetes mellitus with diabetic nephropathy: Secondary | ICD-10-CM | POA: Diagnosis not present

## 2015-10-16 DIAGNOSIS — N2581 Secondary hyperparathyroidism of renal origin: Secondary | ICD-10-CM | POA: Diagnosis not present

## 2015-10-16 DIAGNOSIS — N186 End stage renal disease: Secondary | ICD-10-CM | POA: Diagnosis not present

## 2015-10-16 DIAGNOSIS — D631 Anemia in chronic kidney disease: Secondary | ICD-10-CM | POA: Diagnosis not present

## 2015-10-18 DIAGNOSIS — N2581 Secondary hyperparathyroidism of renal origin: Secondary | ICD-10-CM | POA: Diagnosis not present

## 2015-10-18 DIAGNOSIS — D631 Anemia in chronic kidney disease: Secondary | ICD-10-CM | POA: Diagnosis not present

## 2015-10-18 DIAGNOSIS — E1121 Type 2 diabetes mellitus with diabetic nephropathy: Secondary | ICD-10-CM | POA: Diagnosis not present

## 2015-10-18 DIAGNOSIS — N186 End stage renal disease: Secondary | ICD-10-CM | POA: Diagnosis not present

## 2015-10-18 DIAGNOSIS — D509 Iron deficiency anemia, unspecified: Secondary | ICD-10-CM | POA: Diagnosis not present

## 2015-10-20 DIAGNOSIS — D631 Anemia in chronic kidney disease: Secondary | ICD-10-CM | POA: Diagnosis not present

## 2015-10-20 DIAGNOSIS — D509 Iron deficiency anemia, unspecified: Secondary | ICD-10-CM | POA: Diagnosis not present

## 2015-10-20 DIAGNOSIS — N2581 Secondary hyperparathyroidism of renal origin: Secondary | ICD-10-CM | POA: Diagnosis not present

## 2015-10-20 DIAGNOSIS — E1121 Type 2 diabetes mellitus with diabetic nephropathy: Secondary | ICD-10-CM | POA: Diagnosis not present

## 2015-10-20 DIAGNOSIS — N186 End stage renal disease: Secondary | ICD-10-CM | POA: Diagnosis not present

## 2015-10-23 DIAGNOSIS — E1121 Type 2 diabetes mellitus with diabetic nephropathy: Secondary | ICD-10-CM | POA: Diagnosis not present

## 2015-10-23 DIAGNOSIS — D509 Iron deficiency anemia, unspecified: Secondary | ICD-10-CM | POA: Diagnosis not present

## 2015-10-23 DIAGNOSIS — N186 End stage renal disease: Secondary | ICD-10-CM | POA: Diagnosis not present

## 2015-10-23 DIAGNOSIS — N2581 Secondary hyperparathyroidism of renal origin: Secondary | ICD-10-CM | POA: Diagnosis not present

## 2015-10-23 DIAGNOSIS — D631 Anemia in chronic kidney disease: Secondary | ICD-10-CM | POA: Diagnosis not present

## 2015-10-25 DIAGNOSIS — E1121 Type 2 diabetes mellitus with diabetic nephropathy: Secondary | ICD-10-CM | POA: Diagnosis not present

## 2015-10-25 DIAGNOSIS — D509 Iron deficiency anemia, unspecified: Secondary | ICD-10-CM | POA: Diagnosis not present

## 2015-10-25 DIAGNOSIS — D631 Anemia in chronic kidney disease: Secondary | ICD-10-CM | POA: Diagnosis not present

## 2015-10-25 DIAGNOSIS — N2581 Secondary hyperparathyroidism of renal origin: Secondary | ICD-10-CM | POA: Diagnosis not present

## 2015-10-25 DIAGNOSIS — N186 End stage renal disease: Secondary | ICD-10-CM | POA: Diagnosis not present

## 2015-10-28 DIAGNOSIS — N186 End stage renal disease: Secondary | ICD-10-CM | POA: Diagnosis not present

## 2015-10-28 DIAGNOSIS — D509 Iron deficiency anemia, unspecified: Secondary | ICD-10-CM | POA: Diagnosis not present

## 2015-10-28 DIAGNOSIS — D631 Anemia in chronic kidney disease: Secondary | ICD-10-CM | POA: Diagnosis not present

## 2015-10-28 DIAGNOSIS — N2581 Secondary hyperparathyroidism of renal origin: Secondary | ICD-10-CM | POA: Diagnosis not present

## 2015-10-28 DIAGNOSIS — E1121 Type 2 diabetes mellitus with diabetic nephropathy: Secondary | ICD-10-CM | POA: Diagnosis not present

## 2015-10-30 DIAGNOSIS — D631 Anemia in chronic kidney disease: Secondary | ICD-10-CM | POA: Diagnosis not present

## 2015-10-30 DIAGNOSIS — N2581 Secondary hyperparathyroidism of renal origin: Secondary | ICD-10-CM | POA: Diagnosis not present

## 2015-10-30 DIAGNOSIS — E1121 Type 2 diabetes mellitus with diabetic nephropathy: Secondary | ICD-10-CM | POA: Diagnosis not present

## 2015-10-30 DIAGNOSIS — N186 End stage renal disease: Secondary | ICD-10-CM | POA: Diagnosis not present

## 2015-10-30 DIAGNOSIS — D509 Iron deficiency anemia, unspecified: Secondary | ICD-10-CM | POA: Diagnosis not present

## 2015-11-01 ENCOUNTER — Encounter: Payer: Self-pay | Admitting: Cardiology

## 2015-11-01 DIAGNOSIS — D631 Anemia in chronic kidney disease: Secondary | ICD-10-CM | POA: Diagnosis not present

## 2015-11-01 DIAGNOSIS — D509 Iron deficiency anemia, unspecified: Secondary | ICD-10-CM | POA: Diagnosis not present

## 2015-11-01 DIAGNOSIS — N2581 Secondary hyperparathyroidism of renal origin: Secondary | ICD-10-CM | POA: Diagnosis not present

## 2015-11-01 DIAGNOSIS — E1121 Type 2 diabetes mellitus with diabetic nephropathy: Secondary | ICD-10-CM | POA: Diagnosis not present

## 2015-11-01 DIAGNOSIS — N186 End stage renal disease: Secondary | ICD-10-CM | POA: Diagnosis not present

## 2015-11-02 ENCOUNTER — Ambulatory Visit (INDEPENDENT_AMBULATORY_CARE_PROVIDER_SITE_OTHER): Payer: Medicare Other | Admitting: Family Medicine

## 2015-11-02 VITALS — BP 152/72 | HR 79 | Temp 98.2°F | Wt 253.0 lb

## 2015-11-02 DIAGNOSIS — J3489 Other specified disorders of nose and nasal sinuses: Secondary | ICD-10-CM | POA: Diagnosis not present

## 2015-11-02 DIAGNOSIS — R040 Epistaxis: Secondary | ICD-10-CM | POA: Diagnosis not present

## 2015-11-02 DIAGNOSIS — H1013 Acute atopic conjunctivitis, bilateral: Secondary | ICD-10-CM | POA: Diagnosis not present

## 2015-11-02 MED ORDER — CETIRIZINE HCL 5 MG PO TABS: 5.0000 mg | ORAL_TABLET | Freq: Every day | ORAL | Status: DC

## 2015-11-02 MED ORDER — OLOPATADINE HCL 0.2 % OP SOLN
OPHTHALMIC | Status: DC
Start: 2015-11-02 — End: 2016-11-22

## 2015-11-02 NOTE — Patient Instructions (Signed)
Zyrtec 5mg  daily - sent this in (note this is different dosing than the usual over the counter dosing of 10mg  daily) Sent in eye drops for you Use nasal saline spray in your nose Avoid roughly blowing  Return if not improving or if worsens  Be well, Dr. Ardelia Mems

## 2015-11-02 NOTE — Progress Notes (Signed)
Date of Visit: 11/02/2015   HPI:  Patient presents for a same day appointment to discuss itchy watery eyes.  Eyes have felt puffy and been running clear fluid since Monday. Wiping eyes a lot. Wakes up and they are matted shut. No one else has similar symptoms No vision changes Does have runny nose  Has had some blood in his mucous when he blows his nose. No cough No history of this previously Nose does not bleed unless he blows nose. Has not tried any medications.   ROS: See HPI  [place]: history of CAD, MI, cardiac arrest, OSA on bipap, hypertension, ESRD, diabetes, hyperlipidemia, mitral regurgitation, CHF  PHYSICAL EXAM: BP 152/72 mmHg  Pulse 79  Temp(Src) 98.2 F (36.8 C) (Oral)  Wt 253 lb (114.76 kg) Gen: NAD, pleasant, cooperative HEENT: normocephalic, atraumatic, moist mucous membranes. Pupils equal round and reactive to light. Oropharynx clear. Tympanic membranes clear bilaterally. Nares with some drainage and signs of recent blood. No active bleeding. Sclera injected bilaterally with lots of watery drainage. No purulent drainage Heart: regular rate and rhythm, +systolic murmur Lungs: clear to auscultation bilaterally, normal work of breathing  Neuro: alert, grossly nonfocal, speech normal  ASSESSMENT/PLAN:  1. Allergic conjunctivitis - symptoms most likely allergic though could be viral. No vision changes, vision 20/40 bilaterally. Will treat with patanol drops and zyrtec 5mg  daily (renal dosing for dialysis). Follow up if not improving.   2. Nosebleed -suspect related to runny nose/allergies. Recommend nasal saline spray in addition to above measures Follow up if worsening or persistent.  FOLLOW UP: Follow up as needed if symptoms worsen or fail to improve.    [place]. Ardelia Mems, [place]

## 2015-11-03 DIAGNOSIS — D631 Anemia in chronic kidney disease: Secondary | ICD-10-CM | POA: Diagnosis not present

## 2015-11-03 DIAGNOSIS — N186 End stage renal disease: Secondary | ICD-10-CM | POA: Diagnosis not present

## 2015-11-03 DIAGNOSIS — D509 Iron deficiency anemia, unspecified: Secondary | ICD-10-CM | POA: Diagnosis not present

## 2015-11-03 DIAGNOSIS — N2581 Secondary hyperparathyroidism of renal origin: Secondary | ICD-10-CM | POA: Diagnosis not present

## 2015-11-03 DIAGNOSIS — E1121 Type 2 diabetes mellitus with diabetic nephropathy: Secondary | ICD-10-CM | POA: Diagnosis not present

## 2015-11-06 DIAGNOSIS — D631 Anemia in chronic kidney disease: Secondary | ICD-10-CM | POA: Diagnosis not present

## 2015-11-06 DIAGNOSIS — E1121 Type 2 diabetes mellitus with diabetic nephropathy: Secondary | ICD-10-CM | POA: Diagnosis not present

## 2015-11-06 DIAGNOSIS — D509 Iron deficiency anemia, unspecified: Secondary | ICD-10-CM | POA: Diagnosis not present

## 2015-11-06 DIAGNOSIS — N186 End stage renal disease: Secondary | ICD-10-CM | POA: Diagnosis not present

## 2015-11-06 DIAGNOSIS — N2581 Secondary hyperparathyroidism of renal origin: Secondary | ICD-10-CM | POA: Diagnosis not present

## 2015-11-07 DIAGNOSIS — H40023 Open angle with borderline findings, high risk, bilateral: Secondary | ICD-10-CM | POA: Diagnosis not present

## 2015-11-07 DIAGNOSIS — E113293 Type 2 diabetes mellitus with mild nonproliferative diabetic retinopathy without macular edema, bilateral: Secondary | ICD-10-CM | POA: Diagnosis not present

## 2015-11-08 DIAGNOSIS — N2581 Secondary hyperparathyroidism of renal origin: Secondary | ICD-10-CM | POA: Diagnosis not present

## 2015-11-08 DIAGNOSIS — D509 Iron deficiency anemia, unspecified: Secondary | ICD-10-CM | POA: Diagnosis not present

## 2015-11-08 DIAGNOSIS — N186 End stage renal disease: Secondary | ICD-10-CM | POA: Diagnosis not present

## 2015-11-08 DIAGNOSIS — D631 Anemia in chronic kidney disease: Secondary | ICD-10-CM | POA: Diagnosis not present

## 2015-11-08 DIAGNOSIS — E1121 Type 2 diabetes mellitus with diabetic nephropathy: Secondary | ICD-10-CM | POA: Diagnosis not present

## 2015-11-10 DIAGNOSIS — E1121 Type 2 diabetes mellitus with diabetic nephropathy: Secondary | ICD-10-CM | POA: Diagnosis not present

## 2015-11-10 DIAGNOSIS — D509 Iron deficiency anemia, unspecified: Secondary | ICD-10-CM | POA: Diagnosis not present

## 2015-11-10 DIAGNOSIS — N186 End stage renal disease: Secondary | ICD-10-CM | POA: Diagnosis not present

## 2015-11-10 DIAGNOSIS — D631 Anemia in chronic kidney disease: Secondary | ICD-10-CM | POA: Diagnosis not present

## 2015-11-10 DIAGNOSIS — N2581 Secondary hyperparathyroidism of renal origin: Secondary | ICD-10-CM | POA: Diagnosis not present

## 2015-11-13 DIAGNOSIS — N2581 Secondary hyperparathyroidism of renal origin: Secondary | ICD-10-CM | POA: Diagnosis not present

## 2015-11-13 DIAGNOSIS — E1121 Type 2 diabetes mellitus with diabetic nephropathy: Secondary | ICD-10-CM | POA: Diagnosis not present

## 2015-11-13 DIAGNOSIS — D509 Iron deficiency anemia, unspecified: Secondary | ICD-10-CM | POA: Diagnosis not present

## 2015-11-13 DIAGNOSIS — D631 Anemia in chronic kidney disease: Secondary | ICD-10-CM | POA: Diagnosis not present

## 2015-11-13 DIAGNOSIS — N186 End stage renal disease: Secondary | ICD-10-CM | POA: Diagnosis not present

## 2015-11-15 DIAGNOSIS — D509 Iron deficiency anemia, unspecified: Secondary | ICD-10-CM | POA: Diagnosis not present

## 2015-11-15 DIAGNOSIS — E1122 Type 2 diabetes mellitus with diabetic chronic kidney disease: Secondary | ICD-10-CM | POA: Diagnosis not present

## 2015-11-15 DIAGNOSIS — N186 End stage renal disease: Secondary | ICD-10-CM | POA: Diagnosis not present

## 2015-11-15 DIAGNOSIS — D631 Anemia in chronic kidney disease: Secondary | ICD-10-CM | POA: Diagnosis not present

## 2015-11-15 DIAGNOSIS — N2581 Secondary hyperparathyroidism of renal origin: Secondary | ICD-10-CM | POA: Diagnosis not present

## 2015-11-15 DIAGNOSIS — Z992 Dependence on renal dialysis: Secondary | ICD-10-CM | POA: Diagnosis not present

## 2015-11-15 DIAGNOSIS — E1121 Type 2 diabetes mellitus with diabetic nephropathy: Secondary | ICD-10-CM | POA: Diagnosis not present

## 2015-11-17 DIAGNOSIS — E1121 Type 2 diabetes mellitus with diabetic nephropathy: Secondary | ICD-10-CM | POA: Diagnosis not present

## 2015-11-17 DIAGNOSIS — D509 Iron deficiency anemia, unspecified: Secondary | ICD-10-CM | POA: Diagnosis not present

## 2015-11-17 DIAGNOSIS — N2581 Secondary hyperparathyroidism of renal origin: Secondary | ICD-10-CM | POA: Diagnosis not present

## 2015-11-17 DIAGNOSIS — N186 End stage renal disease: Secondary | ICD-10-CM | POA: Diagnosis not present

## 2015-11-17 DIAGNOSIS — D631 Anemia in chronic kidney disease: Secondary | ICD-10-CM | POA: Diagnosis not present

## 2015-11-17 DIAGNOSIS — E8770 Fluid overload, unspecified: Secondary | ICD-10-CM | POA: Diagnosis not present

## 2015-11-20 DIAGNOSIS — D509 Iron deficiency anemia, unspecified: Secondary | ICD-10-CM | POA: Diagnosis not present

## 2015-11-20 DIAGNOSIS — E1121 Type 2 diabetes mellitus with diabetic nephropathy: Secondary | ICD-10-CM | POA: Diagnosis not present

## 2015-11-20 DIAGNOSIS — N186 End stage renal disease: Secondary | ICD-10-CM | POA: Diagnosis not present

## 2015-11-20 DIAGNOSIS — D631 Anemia in chronic kidney disease: Secondary | ICD-10-CM | POA: Diagnosis not present

## 2015-11-20 DIAGNOSIS — N2581 Secondary hyperparathyroidism of renal origin: Secondary | ICD-10-CM | POA: Diagnosis not present

## 2015-11-20 DIAGNOSIS — E8770 Fluid overload, unspecified: Secondary | ICD-10-CM | POA: Diagnosis not present

## 2015-11-22 DIAGNOSIS — D631 Anemia in chronic kidney disease: Secondary | ICD-10-CM | POA: Diagnosis not present

## 2015-11-22 DIAGNOSIS — D509 Iron deficiency anemia, unspecified: Secondary | ICD-10-CM | POA: Diagnosis not present

## 2015-11-22 DIAGNOSIS — E1121 Type 2 diabetes mellitus with diabetic nephropathy: Secondary | ICD-10-CM | POA: Diagnosis not present

## 2015-11-22 DIAGNOSIS — N186 End stage renal disease: Secondary | ICD-10-CM | POA: Diagnosis not present

## 2015-11-22 DIAGNOSIS — N2581 Secondary hyperparathyroidism of renal origin: Secondary | ICD-10-CM | POA: Diagnosis not present

## 2015-11-22 DIAGNOSIS — E8770 Fluid overload, unspecified: Secondary | ICD-10-CM | POA: Diagnosis not present

## 2015-11-24 DIAGNOSIS — E8770 Fluid overload, unspecified: Secondary | ICD-10-CM | POA: Diagnosis not present

## 2015-11-24 DIAGNOSIS — E1121 Type 2 diabetes mellitus with diabetic nephropathy: Secondary | ICD-10-CM | POA: Diagnosis not present

## 2015-11-24 DIAGNOSIS — N2581 Secondary hyperparathyroidism of renal origin: Secondary | ICD-10-CM | POA: Diagnosis not present

## 2015-11-24 DIAGNOSIS — D631 Anemia in chronic kidney disease: Secondary | ICD-10-CM | POA: Diagnosis not present

## 2015-11-24 DIAGNOSIS — D509 Iron deficiency anemia, unspecified: Secondary | ICD-10-CM | POA: Diagnosis not present

## 2015-11-24 DIAGNOSIS — N186 End stage renal disease: Secondary | ICD-10-CM | POA: Diagnosis not present

## 2015-11-27 DIAGNOSIS — N2581 Secondary hyperparathyroidism of renal origin: Secondary | ICD-10-CM | POA: Diagnosis not present

## 2015-11-27 DIAGNOSIS — E8770 Fluid overload, unspecified: Secondary | ICD-10-CM | POA: Diagnosis not present

## 2015-11-27 DIAGNOSIS — D509 Iron deficiency anemia, unspecified: Secondary | ICD-10-CM | POA: Diagnosis not present

## 2015-11-27 DIAGNOSIS — E1121 Type 2 diabetes mellitus with diabetic nephropathy: Secondary | ICD-10-CM | POA: Diagnosis not present

## 2015-11-27 DIAGNOSIS — N186 End stage renal disease: Secondary | ICD-10-CM | POA: Diagnosis not present

## 2015-11-27 DIAGNOSIS — D631 Anemia in chronic kidney disease: Secondary | ICD-10-CM | POA: Diagnosis not present

## 2015-11-29 DIAGNOSIS — N2581 Secondary hyperparathyroidism of renal origin: Secondary | ICD-10-CM | POA: Diagnosis not present

## 2015-11-29 DIAGNOSIS — D509 Iron deficiency anemia, unspecified: Secondary | ICD-10-CM | POA: Diagnosis not present

## 2015-11-29 DIAGNOSIS — D631 Anemia in chronic kidney disease: Secondary | ICD-10-CM | POA: Diagnosis not present

## 2015-11-29 DIAGNOSIS — N186 End stage renal disease: Secondary | ICD-10-CM | POA: Diagnosis not present

## 2015-11-29 DIAGNOSIS — E1121 Type 2 diabetes mellitus with diabetic nephropathy: Secondary | ICD-10-CM | POA: Diagnosis not present

## 2015-11-29 DIAGNOSIS — E8770 Fluid overload, unspecified: Secondary | ICD-10-CM | POA: Diagnosis not present

## 2015-12-01 DIAGNOSIS — E1121 Type 2 diabetes mellitus with diabetic nephropathy: Secondary | ICD-10-CM | POA: Diagnosis not present

## 2015-12-01 DIAGNOSIS — N2581 Secondary hyperparathyroidism of renal origin: Secondary | ICD-10-CM | POA: Diagnosis not present

## 2015-12-01 DIAGNOSIS — D509 Iron deficiency anemia, unspecified: Secondary | ICD-10-CM | POA: Diagnosis not present

## 2015-12-01 DIAGNOSIS — E8770 Fluid overload, unspecified: Secondary | ICD-10-CM | POA: Diagnosis not present

## 2015-12-01 DIAGNOSIS — D631 Anemia in chronic kidney disease: Secondary | ICD-10-CM | POA: Diagnosis not present

## 2015-12-01 DIAGNOSIS — N186 End stage renal disease: Secondary | ICD-10-CM | POA: Diagnosis not present

## 2015-12-02 DIAGNOSIS — N2581 Secondary hyperparathyroidism of renal origin: Secondary | ICD-10-CM | POA: Diagnosis not present

## 2015-12-02 DIAGNOSIS — N186 End stage renal disease: Secondary | ICD-10-CM | POA: Diagnosis not present

## 2015-12-02 DIAGNOSIS — D509 Iron deficiency anemia, unspecified: Secondary | ICD-10-CM | POA: Diagnosis not present

## 2015-12-02 DIAGNOSIS — D631 Anemia in chronic kidney disease: Secondary | ICD-10-CM | POA: Diagnosis not present

## 2015-12-02 DIAGNOSIS — E8770 Fluid overload, unspecified: Secondary | ICD-10-CM | POA: Diagnosis not present

## 2015-12-02 DIAGNOSIS — E1121 Type 2 diabetes mellitus with diabetic nephropathy: Secondary | ICD-10-CM | POA: Diagnosis not present

## 2015-12-04 DIAGNOSIS — D509 Iron deficiency anemia, unspecified: Secondary | ICD-10-CM | POA: Diagnosis not present

## 2015-12-04 DIAGNOSIS — E8770 Fluid overload, unspecified: Secondary | ICD-10-CM | POA: Diagnosis not present

## 2015-12-04 DIAGNOSIS — D631 Anemia in chronic kidney disease: Secondary | ICD-10-CM | POA: Diagnosis not present

## 2015-12-04 DIAGNOSIS — E1121 Type 2 diabetes mellitus with diabetic nephropathy: Secondary | ICD-10-CM | POA: Diagnosis not present

## 2015-12-04 DIAGNOSIS — N186 End stage renal disease: Secondary | ICD-10-CM | POA: Diagnosis not present

## 2015-12-04 DIAGNOSIS — N2581 Secondary hyperparathyroidism of renal origin: Secondary | ICD-10-CM | POA: Diagnosis not present

## 2015-12-06 DIAGNOSIS — E8770 Fluid overload, unspecified: Secondary | ICD-10-CM | POA: Diagnosis not present

## 2015-12-06 DIAGNOSIS — D509 Iron deficiency anemia, unspecified: Secondary | ICD-10-CM | POA: Diagnosis not present

## 2015-12-06 DIAGNOSIS — D631 Anemia in chronic kidney disease: Secondary | ICD-10-CM | POA: Diagnosis not present

## 2015-12-06 DIAGNOSIS — N186 End stage renal disease: Secondary | ICD-10-CM | POA: Diagnosis not present

## 2015-12-06 DIAGNOSIS — E1121 Type 2 diabetes mellitus with diabetic nephropathy: Secondary | ICD-10-CM | POA: Diagnosis not present

## 2015-12-06 DIAGNOSIS — N2581 Secondary hyperparathyroidism of renal origin: Secondary | ICD-10-CM | POA: Diagnosis not present

## 2015-12-08 DIAGNOSIS — E1121 Type 2 diabetes mellitus with diabetic nephropathy: Secondary | ICD-10-CM | POA: Diagnosis not present

## 2015-12-08 DIAGNOSIS — E8770 Fluid overload, unspecified: Secondary | ICD-10-CM | POA: Diagnosis not present

## 2015-12-08 DIAGNOSIS — D509 Iron deficiency anemia, unspecified: Secondary | ICD-10-CM | POA: Diagnosis not present

## 2015-12-08 DIAGNOSIS — N2581 Secondary hyperparathyroidism of renal origin: Secondary | ICD-10-CM | POA: Diagnosis not present

## 2015-12-08 DIAGNOSIS — D631 Anemia in chronic kidney disease: Secondary | ICD-10-CM | POA: Diagnosis not present

## 2015-12-08 DIAGNOSIS — N186 End stage renal disease: Secondary | ICD-10-CM | POA: Diagnosis not present

## 2015-12-11 ENCOUNTER — Encounter: Payer: Self-pay | Admitting: Gastroenterology

## 2015-12-11 DIAGNOSIS — D509 Iron deficiency anemia, unspecified: Secondary | ICD-10-CM | POA: Diagnosis not present

## 2015-12-11 DIAGNOSIS — D631 Anemia in chronic kidney disease: Secondary | ICD-10-CM | POA: Diagnosis not present

## 2015-12-11 DIAGNOSIS — N2581 Secondary hyperparathyroidism of renal origin: Secondary | ICD-10-CM | POA: Diagnosis not present

## 2015-12-11 DIAGNOSIS — E8770 Fluid overload, unspecified: Secondary | ICD-10-CM | POA: Diagnosis not present

## 2015-12-11 DIAGNOSIS — N186 End stage renal disease: Secondary | ICD-10-CM | POA: Diagnosis not present

## 2015-12-11 DIAGNOSIS — E1121 Type 2 diabetes mellitus with diabetic nephropathy: Secondary | ICD-10-CM | POA: Diagnosis not present

## 2015-12-13 DIAGNOSIS — D631 Anemia in chronic kidney disease: Secondary | ICD-10-CM | POA: Diagnosis not present

## 2015-12-13 DIAGNOSIS — N186 End stage renal disease: Secondary | ICD-10-CM | POA: Diagnosis not present

## 2015-12-13 DIAGNOSIS — E8770 Fluid overload, unspecified: Secondary | ICD-10-CM | POA: Diagnosis not present

## 2015-12-13 DIAGNOSIS — N2581 Secondary hyperparathyroidism of renal origin: Secondary | ICD-10-CM | POA: Diagnosis not present

## 2015-12-13 DIAGNOSIS — D509 Iron deficiency anemia, unspecified: Secondary | ICD-10-CM | POA: Diagnosis not present

## 2015-12-13 DIAGNOSIS — E1121 Type 2 diabetes mellitus with diabetic nephropathy: Secondary | ICD-10-CM | POA: Diagnosis not present

## 2015-12-14 DIAGNOSIS — I10 Essential (primary) hypertension: Secondary | ICD-10-CM | POA: Diagnosis not present

## 2015-12-14 DIAGNOSIS — E1140 Type 2 diabetes mellitus with diabetic neuropathy, unspecified: Secondary | ICD-10-CM | POA: Diagnosis not present

## 2015-12-14 DIAGNOSIS — M1A3720 Chronic gout due to renal impairment, left ankle and foot, without tophus (tophi): Secondary | ICD-10-CM | POA: Diagnosis not present

## 2015-12-15 DIAGNOSIS — N2581 Secondary hyperparathyroidism of renal origin: Secondary | ICD-10-CM | POA: Diagnosis not present

## 2015-12-15 DIAGNOSIS — E8770 Fluid overload, unspecified: Secondary | ICD-10-CM | POA: Diagnosis not present

## 2015-12-15 DIAGNOSIS — D631 Anemia in chronic kidney disease: Secondary | ICD-10-CM | POA: Diagnosis not present

## 2015-12-15 DIAGNOSIS — E1122 Type 2 diabetes mellitus with diabetic chronic kidney disease: Secondary | ICD-10-CM | POA: Diagnosis not present

## 2015-12-15 DIAGNOSIS — N186 End stage renal disease: Secondary | ICD-10-CM | POA: Diagnosis not present

## 2015-12-15 DIAGNOSIS — D509 Iron deficiency anemia, unspecified: Secondary | ICD-10-CM | POA: Diagnosis not present

## 2015-12-15 DIAGNOSIS — E1121 Type 2 diabetes mellitus with diabetic nephropathy: Secondary | ICD-10-CM | POA: Diagnosis not present

## 2015-12-15 DIAGNOSIS — Z992 Dependence on renal dialysis: Secondary | ICD-10-CM | POA: Diagnosis not present

## 2015-12-18 ENCOUNTER — Telehealth: Payer: Self-pay | Admitting: *Deleted

## 2015-12-18 ENCOUNTER — Emergency Department (HOSPITAL_COMMUNITY)
Admission: EM | Admit: 2015-12-18 | Discharge: 2015-12-19 | Disposition: A | Discharge status: Home / Self Care | Payer: Medicare Other | Attending: Emergency Medicine | Admitting: Emergency Medicine

## 2015-12-18 ENCOUNTER — Encounter (HOSPITAL_COMMUNITY): Payer: Self-pay | Admitting: *Deleted

## 2015-12-18 DIAGNOSIS — Z79899 Other long term (current) drug therapy: Secondary | ICD-10-CM | POA: Diagnosis not present

## 2015-12-18 DIAGNOSIS — I2510 Atherosclerotic heart disease of native coronary artery without angina pectoris: Secondary | ICD-10-CM | POA: Diagnosis not present

## 2015-12-18 DIAGNOSIS — D631 Anemia in chronic kidney disease: Secondary | ICD-10-CM | POA: Diagnosis not present

## 2015-12-18 DIAGNOSIS — N189 Chronic kidney disease, unspecified: Secondary | ICD-10-CM | POA: Insufficient documentation

## 2015-12-18 DIAGNOSIS — M62830 Muscle spasm of back: Secondary | ICD-10-CM | POA: Insufficient documentation

## 2015-12-18 DIAGNOSIS — E1121 Type 2 diabetes mellitus with diabetic nephropathy: Secondary | ICD-10-CM | POA: Diagnosis not present

## 2015-12-18 DIAGNOSIS — I129 Hypertensive chronic kidney disease with stage 1 through stage 4 chronic kidney disease, or unspecified chronic kidney disease: Secondary | ICD-10-CM | POA: Diagnosis not present

## 2015-12-18 DIAGNOSIS — Z7982 Long term (current) use of aspirin: Secondary | ICD-10-CM | POA: Diagnosis not present

## 2015-12-18 DIAGNOSIS — I252 Old myocardial infarction: Secondary | ICD-10-CM | POA: Diagnosis not present

## 2015-12-18 DIAGNOSIS — E119 Type 2 diabetes mellitus without complications: Secondary | ICD-10-CM | POA: Diagnosis not present

## 2015-12-18 DIAGNOSIS — Z7984 Long term (current) use of oral hypoglycemic drugs: Secondary | ICD-10-CM | POA: Diagnosis not present

## 2015-12-18 DIAGNOSIS — N2581 Secondary hyperparathyroidism of renal origin: Secondary | ICD-10-CM | POA: Diagnosis not present

## 2015-12-18 DIAGNOSIS — Z87891 Personal history of nicotine dependence: Secondary | ICD-10-CM | POA: Insufficient documentation

## 2015-12-18 DIAGNOSIS — N186 End stage renal disease: Secondary | ICD-10-CM | POA: Diagnosis not present

## 2015-12-18 DIAGNOSIS — D509 Iron deficiency anemia, unspecified: Secondary | ICD-10-CM | POA: Diagnosis not present

## 2015-12-18 NOTE — ED Notes (Signed)
Patient presents with c/o muscle spasms in his back  States he thinks it happened when he was mowing and bouncing on the mower

## 2015-12-18 NOTE — Telephone Encounter (Signed)
Dr Havery Moros, I have been reviewing this patients chart for PV. He has a very complicated medical hx.  He ahs hypertensive heart disease, HTN, MI in 2011, CAD, OSA on bipap, ESRD on dialysis M,W,F, DM, he is on a kidney transplant list and needs pre transplant clearance, MV regurg.  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 10-05-2015 he had a echo bubble test and results in epic( 55 pages of this). His last cardio note stated some changes in his ekg as well. He has no GI history. Can you please review this chart. I was not sure with all of his history if you wanted an OV or id he was ok for a direct colon Please advise and thanks so much for your time, Marijean Niemann

## 2015-12-19 DIAGNOSIS — M62830 Muscle spasm of back: Secondary | ICD-10-CM | POA: Diagnosis not present

## 2015-12-19 MED ORDER — CYCLOBENZAPRINE HCL 10 MG PO TABS
10.0000 mg | ORAL_TABLET | Freq: Two times a day (BID) | ORAL | Status: DC | PRN
Start: 2015-12-19 — End: 2015-12-24

## 2015-12-19 MED ORDER — CYCLOBENZAPRINE HCL 10 MG PO TABS
10.0000 mg | ORAL_TABLET | Freq: Once | ORAL | Status: AC
  Administered 2015-12-19: 10 mg via ORAL
  Filled 2015-12-19: qty 1

## 2015-12-19 NOTE — ED Provider Notes (Signed)
CSN: [id number]     Arrival date & time 12/18/15  2255 History   First MD Initiated Contact with Patient 12/19/15 0007     Chief Complaint  Patient presents with  . Spasms     (Consider location/radiation/quality/duration/timing/severity/associated sxs/prior Treatment) HPI Troy Wells is a 50 y.o. male dialysis patient who presents to the ED with back pain. Patient reports that the pain started a few days ago after he was cutting the grass ridding a lawnmower that was bouncing him up and down. Patient denies any other problems at this time. He has swelling of the feet and ankles that he reports is no worse than usual.   Past Medical History  Diagnosis Date  . Diabetes mellitus without complication ([place])   . Hypertension   . Chronic kidney disease     Chronic Renal Insufficiency  . Myocardial infarction ([place])   . Coronary artery disease 08/15/2009    with AMI  . OSA treated with BiPAP 06/17/2010   Past Surgical History  Procedure Laterality Date  . Av fistula placement Left    Family History  Problem Relation Age of Onset  . Hypertension Mother   . Heart disease Mother 12    cause of death  . Hypertension Father   . Aneurysm Father 48    brain aneurysm  . Heart disease Father 72    cause of death  . Hypertension Brother   . Alcohol abuse Brother    Social History  Substance Use Topics  . Smoking status: Former Smoker -- 1.00 packs/day for 10 years    Types: Cigars    Quit date: 06/18/2011  . Smokeless tobacco: Never Used  . Alcohol Use: 0.0 oz/week    0 Standard drinks or equivalent per week    Review of Systems Negative except as stated in HPI and PMH   Allergies  Review of patient's allergies indicates no known allergies.  Home Medications   Prior to Admission medications   Medication Sig Start Date End Date Taking? Authorizing Provider  acetaminophen (TYLENOL) 500 MG tablet Take 500 mg by mouth every 6 (six) hours as needed for mild pain or moderate  pain.    Historical Provider, MD  allopurinol (ZYLOPRIM) 100 MG tablet Take 100 mg by mouth daily. 10/04/14   Historical Provider, MD  amLODipine (NORVASC) 10 MG tablet Take 10 mg by mouth daily.    Historical Provider, MD  aspirin 81 MG tablet Take 81 mg by mouth daily.    Historical Provider, MD  atorvastatin (LIPITOR) 40 MG tablet Take 1 tablet (40 mg total) by mouth daily. 09/06/15   [name], MD  carvedilol (COREG) 25 MG tablet Take 1 tablet (25 mg total) by mouth 2 (two) times daily with a meal. 08/31/15   [name], MD  cetirizine (ZYRTEC) 5 MG tablet Take 1 tablet (5 mg total) by mouth daily. 11/02/15   Leeanne Rio, MD  colchicine 0.6 MG tablet Take 0.6 mg by mouth 2 (two) times daily. 12/11/10   Historical Provider, MD  cyclobenzaprine (FLEXERIL) 10 MG tablet Take 1 tablet (10 mg total) by mouth 2 (two) times daily as needed for muscle spasms. 12/19/15   [place], NP  gabapentin (NEURONTIN) 300 MG capsule Take 300 mg by mouth daily.     Historical Provider, MD  glipiZIDE (GLUCOTROL) 5 MG tablet Take 5 mg by mouth daily. 10/04/14   Historical Provider, MD  minoxidil (LONITEN) 10 MG tablet Take 10 mg  by mouth 2 (two) times daily.    Historical Provider, MD  Olopatadine HCl (PATADAY) 0.2 % SOLN One drop to each eye once daily 11/02/15   Leeanne Rio, MD  torsemide (DEMADEX) 100 MG tablet Take 100 mg by mouth daily. Take on Tuesday, Thursday, Saturday, and Sunday    Historical Provider, MD   BP 140/71 mmHg  Pulse 93  Temp(Src) 99.3 F (37.4 C) (Oral)  Resp 20  Wt 113.399 kg  SpO2 93% Physical Exam  Constitutional: He is oriented to person, place, and time. He appears well-developed and well-nourished. No distress.  HENT:  Head: Normocephalic and atraumatic.  Eyes: EOM are normal.  Neck: Normal range of motion. Neck supple.  Cardiovascular: Normal rate.   Pulmonary/Chest: Effort normal.  Abdominal: Soft. There is no tenderness.  Musculoskeletal:       Thoracic  back: He exhibits tenderness and spasm. He exhibits normal range of motion, no deformity, no laceration and normal pulse.       Back:  Radial pulses 2+, equal grips, Bilateral foot and ankle edema, pedal pulses palpated.  Neurological: He is alert and oriented to person, place, and time. He has normal strength. No cranial nerve deficit. Gait normal.  Reflex Scores:      Bicep reflexes are 2+ on the right side and 2+ on the left side.      Brachioradialis reflexes are 2+ on the right side and 2+ on the left side.      Patellar reflexes are 2+ on the right side and 2+ on the left side. Skin: Skin is warm and dry.  Psychiatric: He has a normal mood and affect. His behavior is normal.  Nursing note and vitals reviewed.   ED Course  Procedures (including critical care time) Labs Review Labs Reviewed - No data to display   MDM  50 y.o. male with muscle spasm of his back that started a few days ago after ridding a lawnmower stable for d/c without neuro deficits. Will treat with flexeril and patient will f/u with his PCP or return here as needed for worsening symptoms.   Final diagnoses:  Muscle spasm of back       [facility], NP 12/19/15 0112  Merrily Pew, MD 12/20/15 [phone]

## 2015-12-19 NOTE — Discharge Instructions (Signed)
Do not take the muscle relaxant if driving as it will make you sleepy. Follow up with your doctor. Return here as needed.

## 2015-12-20 DIAGNOSIS — D631 Anemia in chronic kidney disease: Secondary | ICD-10-CM | POA: Diagnosis not present

## 2015-12-20 DIAGNOSIS — E1121 Type 2 diabetes mellitus with diabetic nephropathy: Secondary | ICD-10-CM | POA: Diagnosis not present

## 2015-12-20 DIAGNOSIS — N186 End stage renal disease: Secondary | ICD-10-CM | POA: Diagnosis not present

## 2015-12-20 DIAGNOSIS — D509 Iron deficiency anemia, unspecified: Secondary | ICD-10-CM | POA: Diagnosis not present

## 2015-12-20 DIAGNOSIS — N2581 Secondary hyperparathyroidism of renal origin: Secondary | ICD-10-CM | POA: Diagnosis not present

## 2015-12-20 NOTE — Telephone Encounter (Signed)
I reviewed his chart. He is in need of a colonoscopy. He had a dobutatmine stress echo in April which was negative. He is not on any blood thinners. I think he is okay to proceed directly with colonoscopy given his recent cardiology workup which was negative for any significant findings. Thanks

## 2015-12-20 NOTE — Telephone Encounter (Signed)
Thanks, will proceed as scheduled Lelan Pons PV

## 2015-12-22 DIAGNOSIS — N2581 Secondary hyperparathyroidism of renal origin: Secondary | ICD-10-CM | POA: Diagnosis not present

## 2015-12-22 DIAGNOSIS — E1121 Type 2 diabetes mellitus with diabetic nephropathy: Secondary | ICD-10-CM | POA: Diagnosis not present

## 2015-12-22 DIAGNOSIS — N186 End stage renal disease: Secondary | ICD-10-CM | POA: Diagnosis not present

## 2015-12-22 DIAGNOSIS — D509 Iron deficiency anemia, unspecified: Secondary | ICD-10-CM | POA: Diagnosis not present

## 2015-12-22 DIAGNOSIS — D631 Anemia in chronic kidney disease: Secondary | ICD-10-CM | POA: Diagnosis not present

## 2015-12-24 ENCOUNTER — Encounter (HOSPITAL_COMMUNITY): Payer: Self-pay | Admitting: Emergency Medicine

## 2015-12-24 ENCOUNTER — Emergency Department (HOSPITAL_COMMUNITY)
Admission: EM | Admit: 2015-12-24 | Discharge: 2015-12-24 | Disposition: A | Discharge status: Home / Self Care | Payer: Medicare Other | Attending: Emergency Medicine | Admitting: Emergency Medicine

## 2015-12-24 DIAGNOSIS — I2510 Atherosclerotic heart disease of native coronary artery without angina pectoris: Secondary | ICD-10-CM | POA: Insufficient documentation

## 2015-12-24 DIAGNOSIS — Z87891 Personal history of nicotine dependence: Secondary | ICD-10-CM | POA: Insufficient documentation

## 2015-12-24 DIAGNOSIS — I252 Old myocardial infarction: Secondary | ICD-10-CM | POA: Diagnosis not present

## 2015-12-24 DIAGNOSIS — Z7982 Long term (current) use of aspirin: Secondary | ICD-10-CM | POA: Diagnosis not present

## 2015-12-24 DIAGNOSIS — E119 Type 2 diabetes mellitus without complications: Secondary | ICD-10-CM | POA: Diagnosis not present

## 2015-12-24 DIAGNOSIS — W28XXXA Contact with powered lawn mower, initial encounter: Secondary | ICD-10-CM | POA: Insufficient documentation

## 2015-12-24 DIAGNOSIS — S199XXA Unspecified injury of neck, initial encounter: Secondary | ICD-10-CM | POA: Diagnosis present

## 2015-12-24 DIAGNOSIS — N189 Chronic kidney disease, unspecified: Secondary | ICD-10-CM | POA: Insufficient documentation

## 2015-12-24 DIAGNOSIS — Z79899 Other long term (current) drug therapy: Secondary | ICD-10-CM | POA: Insufficient documentation

## 2015-12-24 DIAGNOSIS — S161XXA Strain of muscle, fascia and tendon at neck level, initial encounter: Secondary | ICD-10-CM | POA: Insufficient documentation

## 2015-12-24 DIAGNOSIS — Y939 Activity, unspecified: Secondary | ICD-10-CM | POA: Insufficient documentation

## 2015-12-24 DIAGNOSIS — Y999 Unspecified external cause status: Secondary | ICD-10-CM | POA: Diagnosis not present

## 2015-12-24 DIAGNOSIS — Y929 Unspecified place or not applicable: Secondary | ICD-10-CM | POA: Diagnosis not present

## 2015-12-24 DIAGNOSIS — I129 Hypertensive chronic kidney disease with stage 1 through stage 4 chronic kidney disease, or unspecified chronic kidney disease: Secondary | ICD-10-CM | POA: Diagnosis not present

## 2015-12-24 DIAGNOSIS — Z7984 Long term (current) use of oral hypoglycemic drugs: Secondary | ICD-10-CM | POA: Diagnosis not present

## 2015-12-24 MED ORDER — CYCLOBENZAPRINE HCL 10 MG PO TABS
10.0000 mg | ORAL_TABLET | Freq: Two times a day (BID) | ORAL | Status: DC | PRN
Start: 2015-12-24 — End: 2019-04-14

## 2015-12-24 MED ORDER — ACETAMINOPHEN-CODEINE #3 300-30 MG PO TABS: 1.0000 | ORAL_TABLET | Freq: Three times a day (TID) | ORAL | Status: DC | PRN

## 2015-12-24 NOTE — Discharge Instructions (Signed)
1. Medications: Use your home tylenol as needed for mild/moderate pain. Take Tylenol #3 only as needed for severe pain - This can make you very drowsy - please do not drink alcohol, operate heavy machinery or drive on this medication. DO NOT TAKE HOME TYLENOL AND PRESCRIPTION AT THAT SAME TIME. Flexeril as needed for muscle spasms, continue usual home medications 2. Treatment: rest, drink plenty of fluids, ice affected area as needed.  3. Follow Up: Please follow up with the orthopedist listed if no improvement in symptoms in 5 days for discussion of your diagnoses and further evaluation after today's visit; Please return to the ER for new or worsening symptoms, any additional concerns.

## 2015-12-24 NOTE — ED Notes (Signed)
Pt reports neck pain x 2 days, denies injury or trauma.  Pt ambulatory, NAD noted at this time.

## 2015-12-24 NOTE — ED Provider Notes (Signed)
CSN: [id number]     Arrival date & time 12/24/15  [id number] History   First MD Initiated Contact with Patient 12/24/15 0945     Chief Complaint  Patient presents with  . Neck Pain    (Consider location/radiation/quality/duration/timing/severity/associated sxs/prior Treatment) Patient is a 50 y.o. male presenting with neck pain. The history is provided by the patient and medical records. No language interpreter was used.  Neck Pain Associated symptoms: no fever     Jandel Eckman is a 50 y.o. 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 Patient states he was driving his lawnmower a few days ago and experienced back pain. He was seen here for the same on 7/4. He reports that his back pain has improved, however he feels like he has been compensating and now experiencing right-sided neck pain. Sleeping differently on right side due to back pain. No new injury. Denies radiation of pain. Taken Flexeril with some relief: has 4-5 left. Taken tylenol at home with some mild relief. Denies fever/chills, recent illness.    Past Medical History  Diagnosis Date  . Diabetes mellitus without complication ([place])   . Hypertension   . Chronic kidney disease     Chronic Renal Insufficiency  . Myocardial infarction ([place])   . Coronary artery disease 08/15/2009    with AMI  . OSA treated with BiPAP 06/17/2010   Past Surgical History  Procedure Laterality Date  . Av fistula placement Left    Family History  Problem Relation Age of Onset  . Hypertension Mother   . Heart disease Mother 35    cause of death  . Hypertension Father   . Aneurysm Father 59    brain aneurysm  . Heart disease Father 12    cause of death  . Hypertension Brother   . Alcohol abuse Brother    Social History  Substance Use Topics  . Smoking status: Former Smoker -- 1.00 packs/day for 10 years    Types: Cigars    Quit date: 06/18/2011  . Smokeless tobacco: Never Used  . Alcohol Use: 0.0 oz/week    0 Standard drinks or equivalent per week    Review of Systems  Constitutional: Negative for fever.  Musculoskeletal: Positive for myalgias and neck pain.  Skin: Negative for color change.      Allergies  Review of patient's allergies indicates no known allergies.  Home Medications   Prior to Admission medications   Medication Sig Start Date End Date Taking? Authorizing Provider  acetaminophen (TYLENOL) 500 MG tablet Take 500 mg by mouth every 6 (six) hours as needed for mild pain or moderate pain.    Historical Provider, MD  acetaminophen-codeine (TYLENOL #3) 300-30 MG tablet Take 1 tablet by mouth every 8 (eight) hours as needed for moderate pain. 12/24/15   Ozella Almond Americus Perkey, PA-C  allopurinol (ZYLOPRIM) 100 MG tablet Take 100 mg by mouth daily. 10/04/14   Historical Provider, MD  amLODipine (NORVASC) 10 MG tablet Take 10 mg by mouth daily.    Historical Provider, MD  aspirin 81 MG tablet Take 81 mg by mouth daily.    Historical Provider, MD  atorvastatin (LIPITOR) 40 MG tablet Take 1 tablet (40 mg total) by mouth daily. 09/06/15   [name], MD  carvedilol (COREG) 25 MG tablet Take 1 tablet (25 mg total) by mouth 2 (two) times daily with  a meal. 08/31/15   [name], MD  cetirizine (ZYRTEC) 5 MG tablet Take 1 tablet (5 mg total) by mouth daily. 11/02/15   Leeanne Rio, MD  colchicine 0.6 MG tablet Take 0.6 mg by mouth 2 (two) times daily. 12/11/10   Historical Provider, MD  cyclobenzaprine (FLEXERIL) 10 MG tablet Take 1 tablet (10 mg total) by mouth 2 (two) times daily as needed for muscle spasms. 12/24/15   Ozella Almond Chance Karam, PA-C  gabapentin (NEURONTIN) 300 MG capsule Take 300 mg by mouth daily.     Historical Provider, MD  glipiZIDE (GLUCOTROL) 5 MG tablet Take 5 mg by mouth daily. 10/04/14   Historical Provider, MD  minoxidil (LONITEN) 10 MG tablet Take 10 mg by  mouth 2 (two) times daily.    Historical Provider, MD  Olopatadine HCl (PATADAY) 0.2 % SOLN One drop to each eye once daily 11/02/15   Leeanne Rio, MD  torsemide (DEMADEX) 100 MG tablet Take 100 mg by mouth daily. Take on Tuesday, Thursday, Saturday, and Sunday    Historical Provider, MD   BP 135/67 mmHg  Pulse 71  Temp(Src) 97.8 F (36.6 C) (Oral)  Resp 16  Ht 5' 11.5" (1.816 m)  Wt 113.399 kg  BMI 34.39 kg/m2  SpO2 92% Physical Exam  Constitutional: He is oriented to person, place, and time. He appears well-developed and well-nourished. No distress.  HENT:  Head: Normocephalic and atraumatic.  Neck:  Full ROM intact without spinous process TTP. No bony stepoffs or deformities, + right sided paraspinous muscle TTP. No rigidity or meningeal signs. No bruising, erythema, or swelling.  Cardiovascular: Normal rate, regular rhythm and normal heart sounds.  Exam reveals no gallop and no friction rub.   No murmur heard. Pulmonary/Chest: Effort normal and breath sounds normal. No respiratory distress. He has no wheezes.  Abdominal: Soft. He exhibits no distension. There is no tenderness.  Musculoskeletal:  Full ROM of T and L spine. No midline ttp.  Right shoulder with full ROM. No overlying skin changes. No ttp, crepitus, or deformities appreciated.   Neurological: He is alert and oriented to person, place, and time.  Skin: Skin is warm and dry.  Nursing note and vitals reviewed.   ED Course  Procedures (including critical care time) Labs Review Labs Reviewed - No data to display  Imaging Review No results found. I have personally reviewed and evaluated these images and lab results as part of my medical decision-making.   EKG Interpretation None      MDM   Final diagnoses:  Neck strain, initial encounter   Elkin Vaziri presents to ED for right neck pain which appears to be of musk etiology. No meningeal signs. Afebrile and hemodynamically stable. No midline ttp.  Recent back strain to left and has been compensating and sleeping on right side which is not typical for him. Discussed option of c-spine x-ray which I explained would likely be normal. Patient agrees to hold on imaging at this time. Symptomatic home care instructions discussed with patient. Ortho follow up in 5 days if no improvement. Return precautions discussed. All questions answered.      [facility] Marcille Barman, PA-C 12/24/15 1048  Pattricia Boss, MD 12/24/15 [phone]

## 2015-12-25 DIAGNOSIS — D631 Anemia in chronic kidney disease: Secondary | ICD-10-CM | POA: Diagnosis not present

## 2015-12-25 DIAGNOSIS — N186 End stage renal disease: Secondary | ICD-10-CM | POA: Diagnosis not present

## 2015-12-25 DIAGNOSIS — D509 Iron deficiency anemia, unspecified: Secondary | ICD-10-CM | POA: Diagnosis not present

## 2015-12-25 DIAGNOSIS — E1121 Type 2 diabetes mellitus with diabetic nephropathy: Secondary | ICD-10-CM | POA: Diagnosis not present

## 2015-12-25 DIAGNOSIS — N2581 Secondary hyperparathyroidism of renal origin: Secondary | ICD-10-CM | POA: Diagnosis not present

## 2015-12-26 ENCOUNTER — Ambulatory Visit (AMBULATORY_SURGERY_CENTER): Payer: Self-pay

## 2015-12-26 VITALS — Ht 72.0 in | Wt 261.0 lb

## 2015-12-26 DIAGNOSIS — Z1211 Encounter for screening for malignant neoplasm of colon: Secondary | ICD-10-CM

## 2015-12-26 MED ORDER — NA SULFATE-K SULFATE-MG SULF 17.5-3.13-1.6 GM/177ML PO SOLN: 1.0000 | Freq: Once | ORAL | Status: DC

## 2015-12-26 NOTE — Progress Notes (Signed)
No egg or soy allergy.  No previous complications from anesthesia. No home O2. No diet meds Pt okay for LEC per TE from Dr. Havery Moros 12/22/15.

## 2015-12-27 ENCOUNTER — Encounter: Payer: Self-pay | Admitting: Family Medicine

## 2015-12-27 ENCOUNTER — Ambulatory Visit (INDEPENDENT_AMBULATORY_CARE_PROVIDER_SITE_OTHER): Payer: Medicare Other | Admitting: Family Medicine

## 2015-12-27 VITALS — BP 130/70 | Temp 97.8°F | Wt 264.0 lb

## 2015-12-27 DIAGNOSIS — S161XXA Strain of muscle, fascia and tendon at neck level, initial encounter: Secondary | ICD-10-CM

## 2015-12-27 DIAGNOSIS — D631 Anemia in chronic kidney disease: Secondary | ICD-10-CM | POA: Diagnosis not present

## 2015-12-27 DIAGNOSIS — D509 Iron deficiency anemia, unspecified: Secondary | ICD-10-CM | POA: Diagnosis not present

## 2015-12-27 DIAGNOSIS — E1121 Type 2 diabetes mellitus with diabetic nephropathy: Secondary | ICD-10-CM | POA: Diagnosis not present

## 2015-12-27 DIAGNOSIS — E119 Type 2 diabetes mellitus without complications: Secondary | ICD-10-CM

## 2015-12-27 DIAGNOSIS — N186 End stage renal disease: Secondary | ICD-10-CM | POA: Diagnosis not present

## 2015-12-27 DIAGNOSIS — N2581 Secondary hyperparathyroidism of renal origin: Secondary | ICD-10-CM | POA: Diagnosis not present

## 2015-12-27 LAB — POCT GLYCOSYLATED HEMOGLOBIN (HGB A1C): Hemoglobin A1C: 5.7

## 2015-12-27 MED ORDER — TETANUS-DIPHTH-ACELL PERTUSSIS 5-2.5-18.5 LF-MCG/0.5 IM SUSP: 0.5000 mL | Freq: Once | INTRAMUSCULAR | Status: DC

## 2015-12-27 MED ORDER — ACETAMINOPHEN-CODEINE #3 300-30 MG PO TABS: 1.0000 | ORAL_TABLET | Freq: Three times a day (TID) | ORAL | Status: DC | PRN

## 2015-12-27 NOTE — Patient Instructions (Addendum)
I am glad to hear that your neck pain is improving.  You can use Tylenol as needed for pain.  Continue keeping your neck mobile.  You may apply ice/ heat to help with symptoms.  You can also use topicals like tiger balm, icy hot, bengay, etc.  I have given you a handout on the exercises we talked about.  I recommend that you go to [facility] as directed if this does not improve.  You may require physical therapy.  Cervical Strain A cervical sprain is when the tissues (ligaments) that hold the neck bones in place stretch or tear. HOME CARE   Put ice on the injured area.  Put ice in a plastic bag.  Place a towel between your skin and the bag.  Leave the ice on for 15-20 minutes, 3-4 times a day.  You may have been given a collar to wear. This collar keeps your neck from moving while you heal.  Do not take the collar off unless told by your doctor.  If you have long hair, keep it outside of the collar.  Ask your doctor before changing the position of your collar. You may need to change its position over time to make it more comfortable.  If you are allowed to take off the collar for cleaning or bathing, follow your doctor's instructions on how to do it safely.  Keep your collar clean by wiping it with mild soap and water. Dry it completely. If the collar has removable pads, remove them every 1-2 days to hand wash them with soap and water. Allow them to air dry. They should be dry before you wear them in the collar.  Do not drive while wearing the collar.  Only take medicine as told by your doctor.  Keep all doctor visits as told.  Keep all physical therapy visits as told.  Adjust your work station so that you have good posture while you work.  Avoid positions and activities that make your problems worse.  Warm up and stretch before being active. GET HELP IF:  Your pain is not controlled with medicine.  You cannot take less pain medicine over time as  planned.  Your activity level does not improve as expected. GET HELP RIGHT AWAY IF:   You are bleeding.  Your stomach is upset.  You have an allergic reaction to your medicine.  You develop new problems that you cannot explain.  You lose feeling (become numb) or you cannot move any part of your body (paralysis).  You have tingling or weakness in any part of your body.  Your symptoms get worse. Symptoms include:  Pain, soreness, stiffness, puffiness (swelling), or a burning feeling in your neck.  Pain when your neck is touched.  Shoulder or upper back pain.  Limited ability to move your neck.  Headache.  Dizziness.  Your hands or arms feel week, lose feeling, or tingle.  Muscle spasms.  Difficulty swallowing or chewing. MAKE SURE YOU:   Understand these instructions.  Will watch your condition.  Will get help right away if you are not doing well or get worse.   This information is not intended to replace advice given to you by your health care provider. Make sure you discuss any questions you have with your health care provider.   Document Released: 11/20/2007 Document Revised: 02/03/2013 Document Reviewed: 12/09/2012 Elsevier Interactive Patient Education [employer].

## 2015-12-27 NOTE — Progress Notes (Signed)
   Subjective: [id number] pain [id number] Bar[place]ero is a 50 y.o. male presenting to clinic today for same day appointment. PCP: [name], MD Concerns today include:  1. Neck pain Patient reports that neck pain began 6/29 after mowing grass.  He notes that pain started in his back.  He went to ED for evaluation and was sent home with Flexeril.  Pain persisted and moved in C-Spine and shoulders.  He returned to ED and was prescribed Tylenol #3 and flexeril.  He feels like those medications helped.  He notes that he took the last of the Tylenol #3 last night.  He notes that he continues to have pain with turning head to the right.  Pain was a 9/10.  Pain is described as tight and sharp and is a 3-4/10.  It does not radiate.  He reports a h/o injury about 30 years ago when he broke his neck, fracturing C 6-7.    Social History Reviewed: non smoker. FamHx and MedHx reviewed.  Please see EMR. Health Maintenance: TDap due, Colonoscopy already scheduled for Tues.  ROS: Per HPI  Objective: Office vital signs reviewed. BP 130/70 mmHg  Temp(Src) 97.8 F (36.6 C) (Oral)  Wt 264 lb (119.75 kg)  SpO2 95%  Physical Examination:  General: Awake, alert, obese, No acute distress HEENT: Normal, sclera white MSK: 5/5 strength UE  Neck: 15-20 degrees decreased AROM in right rotation of neck and of right side bending of neck. Left rotation and left side bending of neck with full painless AROM.  Full painless AROM in flexion and extension.  No erythema or edema of c-spine. No TTP. Moderate increase in tonicity of the trapezius and paraspinal muscles.  No palpable bony abnormalities.  Negative spurlings. Skin: dry, intact, no rashes or lesions Neuro: Strength and light touch sensation grossly intact  Assessment/ Plan: 50 y.o. male   1. Cervical muscle strain, initial encounter, seems to be improving.  Unfortunately, was not provided/ has not been doing stretching/ROM exercises for c-spine.  Has h/o  injury to c-spine, which may be why he has had a prolonged spasm.  Not a candidate for NSAID, pt ESRD. - Expect spasm to improve by next week - If no improvement with exercises (sports med advisor for cervical sprain/strain), would recommend that patient be evaluated by ortho and rx'd PT - acetaminophen-codeine (TYLENOL #3) 300-30 MG tablet; Take 1 tablet by mouth every 8 (eight) hours as needed for moderate pain.  Dispense: 6 tablet; Refill: 0 - Has enough Flexeril.  Discussed avoiding use of heavy machinery during use of either medication. - Return precautions reviewed - Follow up prn  2. Diabetes mellitus without complication ([place]); has been controlled in past w/ last A1c 5.9. - POCT A1C today - Recommend continued follow up with PCP  Follow up prn or as scheduled for routine care.  Written Rx for TDap provided today (Patient has Medicare).  Additionally, has colonoscopy scheduled for next week.  Janora Norlander, DO PGY-3, [facility]

## 2015-12-29 DIAGNOSIS — N2581 Secondary hyperparathyroidism of renal origin: Secondary | ICD-10-CM | POA: Diagnosis not present

## 2015-12-29 DIAGNOSIS — N186 End stage renal disease: Secondary | ICD-10-CM | POA: Diagnosis not present

## 2015-12-29 DIAGNOSIS — D631 Anemia in chronic kidney disease: Secondary | ICD-10-CM | POA: Diagnosis not present

## 2015-12-29 DIAGNOSIS — E1121 Type 2 diabetes mellitus with diabetic nephropathy: Secondary | ICD-10-CM | POA: Diagnosis not present

## 2015-12-29 DIAGNOSIS — D509 Iron deficiency anemia, unspecified: Secondary | ICD-10-CM | POA: Diagnosis not present

## 2016-01-01 DIAGNOSIS — N186 End stage renal disease: Secondary | ICD-10-CM | POA: Diagnosis not present

## 2016-01-01 DIAGNOSIS — D631 Anemia in chronic kidney disease: Secondary | ICD-10-CM | POA: Diagnosis not present

## 2016-01-01 DIAGNOSIS — N2581 Secondary hyperparathyroidism of renal origin: Secondary | ICD-10-CM | POA: Diagnosis not present

## 2016-01-01 DIAGNOSIS — D509 Iron deficiency anemia, unspecified: Secondary | ICD-10-CM | POA: Diagnosis not present

## 2016-01-01 DIAGNOSIS — E1121 Type 2 diabetes mellitus with diabetic nephropathy: Secondary | ICD-10-CM | POA: Diagnosis not present

## 2016-01-02 ENCOUNTER — Encounter: Payer: Self-pay | Admitting: Gastroenterology

## 2016-01-02 ENCOUNTER — Ambulatory Visit (AMBULATORY_SURGERY_CENTER): Payer: Medicare Other | Admitting: Gastroenterology

## 2016-01-02 ENCOUNTER — Encounter: Payer: Self-pay | Admitting: Family Medicine

## 2016-01-02 VITALS — BP 143/84 | HR 82 | Temp 99.6°F | Resp 11 | Ht 72.0 in | Wt 264.0 lb

## 2016-01-02 DIAGNOSIS — K5730 Diverticulosis of large intestine without perforation or abscess without bleeding: Secondary | ICD-10-CM

## 2016-01-02 DIAGNOSIS — D123 Benign neoplasm of transverse colon: Secondary | ICD-10-CM | POA: Diagnosis not present

## 2016-01-02 DIAGNOSIS — E119 Type 2 diabetes mellitus without complications: Secondary | ICD-10-CM | POA: Diagnosis not present

## 2016-01-02 DIAGNOSIS — G4733 Obstructive sleep apnea (adult) (pediatric): Secondary | ICD-10-CM | POA: Diagnosis not present

## 2016-01-02 DIAGNOSIS — I2510 Atherosclerotic heart disease of native coronary artery without angina pectoris: Secondary | ICD-10-CM | POA: Diagnosis not present

## 2016-01-02 DIAGNOSIS — N186 End stage renal disease: Secondary | ICD-10-CM | POA: Diagnosis not present

## 2016-01-02 DIAGNOSIS — D128 Benign neoplasm of rectum: Secondary | ICD-10-CM

## 2016-01-02 DIAGNOSIS — I340 Nonrheumatic mitral (valve) insufficiency: Secondary | ICD-10-CM | POA: Diagnosis not present

## 2016-01-02 DIAGNOSIS — I10 Essential (primary) hypertension: Secondary | ICD-10-CM | POA: Diagnosis not present

## 2016-01-02 DIAGNOSIS — Z1211 Encounter for screening for malignant neoplasm of colon: Secondary | ICD-10-CM

## 2016-01-02 DIAGNOSIS — K621 Rectal polyp: Secondary | ICD-10-CM | POA: Diagnosis not present

## 2016-01-02 DIAGNOSIS — K635 Polyp of colon: Secondary | ICD-10-CM | POA: Insufficient documentation

## 2016-01-02 DIAGNOSIS — I509 Heart failure, unspecified: Secondary | ICD-10-CM | POA: Diagnosis not present

## 2016-01-02 DIAGNOSIS — I252 Old myocardial infarction: Secondary | ICD-10-CM | POA: Diagnosis not present

## 2016-01-02 HISTORY — DX: Diverticulosis of large intestine without perforation or abscess without bleeding: K57.30

## 2016-01-02 HISTORY — DX: Polyp of colon: K63.5

## 2016-01-02 LAB — GLUCOSE, CAPILLARY
Glucose-Capillary: 60 mg/dL — ABNORMAL LOW (ref 65–99)
Glucose-Capillary: 122 mg/dL — ABNORMAL HIGH (ref 65–99)

## 2016-01-02 MED ORDER — SODIUM CHLORIDE 0.9 % IV SOLN: 500.0000 mL | INTRAVENOUS | Status: DC

## 2016-01-02 NOTE — Progress Notes (Signed)
Called to room to assist during endoscopic procedure.  Patient ID and intended procedure confirmed with present staff. Received instructions for my participation in the procedure from the performing physician.  

## 2016-01-02 NOTE — Progress Notes (Signed)
No problems noted in the recovery room. maw 

## 2016-01-02 NOTE — Patient Instructions (Addendum)
YOU HAD AN ENDOSCOPIC PROCEDURE TODAY AT [facility]:   Refer to the procedure report that was given to you for any specific questions about what was found during the examination.  If the procedure report does not answer your questions, please call your gastroenterologist to clarify.  If you requested that your care partner not be given the details of your procedure findings, then the procedure report has been included in a sealed envelope for you to review at your convenience later.  YOU SHOULD EXPECT: Some feelings of bloating in the abdomen. Passage of more gas than usual.  Walking can help get rid of the air that was put into your GI tract during the procedure and reduce the bloating. If you had a lower endoscopy (such as a colonoscopy or flexible sigmoidoscopy) you may notice spotting of blood in your stool or on the toilet paper. If you underwent a bowel prep for your procedure, you may not have a normal bowel movement for a few days.  Please Note:  You might notice some irritation and congestion in your nose or some drainage.  This is from the oxygen used during your procedure.  There is no need for concern and it should clear up in a day or so.  SYMPTOMS TO REPORT IMMEDIATELY:   Following lower endoscopy (colonoscopy or flexible sigmoidoscopy):  Excessive amounts of blood in the stool  Significant tenderness or worsening of abdominal pains  Swelling of the abdomen that is new, acute  Fever of 100F or higher   For urgent or emergent issues, a gastroenterologist can be reached at any hour by calling [phone].   DIET: Your first meal following the procedure should be a small meal and then it is ok to progress to your normal diet. Heavy or fried foods are harder to digest and may make you feel nauseous or bloated.  Likewise, meals heavy in dairy and vegetables can increase bloating.  Drink plenty of fluids but you should avoid alcoholic beverages for 24  hours.  ACTIVITY:  You should plan to take it easy for the rest of today and you should NOT DRIVE or use heavy machinery until tomorrow (because of the sedation medicines used during the test).    FOLLOW UP: Our staff will call the number listed on your records the next business day following your procedure to check on you and address any questions or concerns that you may have regarding the information given to you following your procedure. If we do not reach you, we will leave a message.  However, if you are feeling well and you are not experiencing any problems, there is no need to return our call.  We will assume that you have returned to your regular daily activities without incident.  If any biopsies were taken you will be contacted by phone or by letter within the next 1-3 weeks.  Please call us at [phone] if you have not heard about the biopsies in 3 weeks.    SIGNATURES/CONFIDENTIALITY: You and/or your care partner have signed paperwork which will be entered into your electronic medical record.  These signatures attest to the fact that that the information above on your After Visit Summary has been reviewed and is understood.  Full responsibility of the confidentiality of this discharge information lies with you and/or your care-partner.    Handouts were given to your care partner on polyps, diverticulosis, hemorrhoids, and a high fiber diet. Your blood sugar was 122 in  the recovery room. You may resume your current medications today. Await biopsy results. Please call if any questions or concerns.

## 2016-01-02 NOTE — Op Note (Signed)
[facility] Patient Name: Troy Wells Procedure Date: 01/02/2016 8:22 AM MRN: [id number] Endoscopist: Remo Lipps P. Havery Moros , MD Age: 50 Referring MD:  Date of Birth: [birth year] Gender: Male Account #: [id number] Procedure:                Colonoscopy Indications:              Screening for malignant neoplasm in the colon, This                            is the patient's first colonoscopy Medicines:                Monitored Anesthesia Care Procedure:                Pre-Anesthesia Assessment:                           - Prior to the procedure, a History and Physical                            was performed, and patient medications and                            allergies were reviewed. The patient's tolerance of                            previous anesthesia was also reviewed. The risks                            and benefits of the procedure and the sedation                            options and risks were discussed with the patient.                            All questions were answered, and informed consent                            was obtained. Prior Anticoagulants: The patient has                            taken aspirin, last dose was 1 day prior to                            procedure. ASA Grade Assessment: III - A patient                            with severe systemic disease. After reviewing the                            risks and benefits, the patient was deemed in                            satisfactory condition to undergo the procedure.  After obtaining informed consent, the colonoscope                            was passed under direct vision. Throughout the                            procedure, the patient's blood pressure, pulse, and                            oxygen saturations were monitored continuously. The                            Model CF-HQ190L [id number] scope was introduced                            through the  anus and advanced to the the cecum,                            identified by appendiceal orifice and ileocecal                            valve. The colonoscopy was technically difficult                            and complex due to significant looping. The patient                            tolerated the procedure well. The quality of the                            bowel preparation was adequate. The ileocecal                            valve, appendiceal orifice, and rectum were                            photographed. Scope In: 8:25:10 AM Scope Out: 8:50:39 AM Scope Withdrawal Time: 0 hours 15 minutes 39 seconds  Total Procedure Duration: 0 hours 25 minutes 29 seconds  Findings:                 The perianal and digital rectal examinations were                            normal.                           A 3 mm polyp was found in the transverse colon. The                            polyp was sessile. The polyp was removed with a                            cold biopsy forceps. Resection and retrieval were  complete.                           A 3 mm polyp was found in the rectum. The polyp was                            sessile. The polyp was removed with a cold biopsy                            forceps. Resection and retrieval were complete.                           Scattered medium-mouthed diverticula were found in                            the entire colon.                           Non-bleeding internal hemorrhoids were found during                            retroflexion.                           The exam was otherwise without abnormality. Cecal                            intubation and withdrawal was prolonged given                            significant lavage of stool to obtain adequate                            views which was needed, and protuiberant abdomen                            led to significant looping requiring abdominal wall                             pressure to advance the scope Complications:            No immediate complications. Estimated blood loss:                            Minimal. Estimated Blood Loss:     Estimated blood loss was minimal. Impression:               - One 3 mm polyp in the transverse colon, removed                            with a cold biopsy forceps. Resected and retrieved.                           - One 3 mm polyp in the rectum, removed with a cold  biopsy forceps. Resected and retrieved.                           - Diverticulosis in the entire examined colon.                           - Non-bleeding internal hemorrhoids.                           - The examination was otherwise normal.                           - Significant looping which prolonged cecal                            intubation Recommendation:           - Patient has a contact number available for                            emergencies. The signs and symptoms of potential                            delayed complications were discussed with the                            patient. Return to normal activities tomorrow.                            Written discharge instructions were provided to the                            patient.                           - Resume previous diet.                           - Continue present medications.                           - Await pathology results.                           - Repeat colonoscopy is recommended for                            surveillance. The colonoscopy date will be                            determined after pathology results from today's                            exam become available for review. Remo Lipps P. Kafi Dotter, MD 01/02/2016 8:56:01 AM This report has been signed electronically.

## 2016-01-03 ENCOUNTER — Telehealth: Payer: Self-pay | Admitting: *Deleted

## 2016-01-03 DIAGNOSIS — N186 End stage renal disease: Secondary | ICD-10-CM | POA: Diagnosis not present

## 2016-01-03 DIAGNOSIS — D631 Anemia in chronic kidney disease: Secondary | ICD-10-CM | POA: Diagnosis not present

## 2016-01-03 DIAGNOSIS — N2581 Secondary hyperparathyroidism of renal origin: Secondary | ICD-10-CM | POA: Diagnosis not present

## 2016-01-03 DIAGNOSIS — D509 Iron deficiency anemia, unspecified: Secondary | ICD-10-CM | POA: Diagnosis not present

## 2016-01-03 DIAGNOSIS — E1121 Type 2 diabetes mellitus with diabetic nephropathy: Secondary | ICD-10-CM | POA: Diagnosis not present

## 2016-01-03 NOTE — Telephone Encounter (Signed)
No answer, left message to call if questions or concerns. 

## 2016-01-04 ENCOUNTER — Encounter: Payer: Self-pay | Admitting: Gastroenterology

## 2016-01-05 DIAGNOSIS — N186 End stage renal disease: Secondary | ICD-10-CM | POA: Diagnosis not present

## 2016-01-05 DIAGNOSIS — D509 Iron deficiency anemia, unspecified: Secondary | ICD-10-CM | POA: Diagnosis not present

## 2016-01-05 DIAGNOSIS — D631 Anemia in chronic kidney disease: Secondary | ICD-10-CM | POA: Diagnosis not present

## 2016-01-05 DIAGNOSIS — E1121 Type 2 diabetes mellitus with diabetic nephropathy: Secondary | ICD-10-CM | POA: Diagnosis not present

## 2016-01-05 DIAGNOSIS — N2581 Secondary hyperparathyroidism of renal origin: Secondary | ICD-10-CM | POA: Diagnosis not present

## 2016-01-08 DIAGNOSIS — D509 Iron deficiency anemia, unspecified: Secondary | ICD-10-CM | POA: Diagnosis not present

## 2016-01-08 DIAGNOSIS — D631 Anemia in chronic kidney disease: Secondary | ICD-10-CM | POA: Diagnosis not present

## 2016-01-08 DIAGNOSIS — N186 End stage renal disease: Secondary | ICD-10-CM | POA: Diagnosis not present

## 2016-01-08 DIAGNOSIS — E1121 Type 2 diabetes mellitus with diabetic nephropathy: Secondary | ICD-10-CM | POA: Diagnosis not present

## 2016-01-08 DIAGNOSIS — N2581 Secondary hyperparathyroidism of renal origin: Secondary | ICD-10-CM | POA: Diagnosis not present

## 2016-01-10 DIAGNOSIS — D631 Anemia in chronic kidney disease: Secondary | ICD-10-CM | POA: Diagnosis not present

## 2016-01-10 DIAGNOSIS — D509 Iron deficiency anemia, unspecified: Secondary | ICD-10-CM | POA: Diagnosis not present

## 2016-01-10 DIAGNOSIS — N186 End stage renal disease: Secondary | ICD-10-CM | POA: Diagnosis not present

## 2016-01-10 DIAGNOSIS — E1121 Type 2 diabetes mellitus with diabetic nephropathy: Secondary | ICD-10-CM | POA: Diagnosis not present

## 2016-01-10 DIAGNOSIS — N2581 Secondary hyperparathyroidism of renal origin: Secondary | ICD-10-CM | POA: Diagnosis not present

## 2016-01-12 DIAGNOSIS — D509 Iron deficiency anemia, unspecified: Secondary | ICD-10-CM | POA: Diagnosis not present

## 2016-01-12 DIAGNOSIS — N2581 Secondary hyperparathyroidism of renal origin: Secondary | ICD-10-CM | POA: Diagnosis not present

## 2016-01-12 DIAGNOSIS — N186 End stage renal disease: Secondary | ICD-10-CM | POA: Diagnosis not present

## 2016-01-12 DIAGNOSIS — D631 Anemia in chronic kidney disease: Secondary | ICD-10-CM | POA: Diagnosis not present

## 2016-01-12 DIAGNOSIS — E1121 Type 2 diabetes mellitus with diabetic nephropathy: Secondary | ICD-10-CM | POA: Diagnosis not present

## 2016-01-15 DIAGNOSIS — D631 Anemia in chronic kidney disease: Secondary | ICD-10-CM | POA: Diagnosis not present

## 2016-01-15 DIAGNOSIS — N186 End stage renal disease: Secondary | ICD-10-CM | POA: Diagnosis not present

## 2016-01-15 DIAGNOSIS — D509 Iron deficiency anemia, unspecified: Secondary | ICD-10-CM | POA: Diagnosis not present

## 2016-01-15 DIAGNOSIS — E1121 Type 2 diabetes mellitus with diabetic nephropathy: Secondary | ICD-10-CM | POA: Diagnosis not present

## 2016-01-15 DIAGNOSIS — E1122 Type 2 diabetes mellitus with diabetic chronic kidney disease: Secondary | ICD-10-CM | POA: Diagnosis not present

## 2016-01-15 DIAGNOSIS — Z992 Dependence on renal dialysis: Secondary | ICD-10-CM | POA: Diagnosis not present

## 2016-01-15 DIAGNOSIS — N2581 Secondary hyperparathyroidism of renal origin: Secondary | ICD-10-CM | POA: Diagnosis not present

## 2016-01-17 DIAGNOSIS — D509 Iron deficiency anemia, unspecified: Secondary | ICD-10-CM | POA: Diagnosis not present

## 2016-01-17 DIAGNOSIS — D631 Anemia in chronic kidney disease: Secondary | ICD-10-CM | POA: Diagnosis not present

## 2016-01-17 DIAGNOSIS — E1121 Type 2 diabetes mellitus with diabetic nephropathy: Secondary | ICD-10-CM | POA: Diagnosis not present

## 2016-01-17 DIAGNOSIS — E8770 Fluid overload, unspecified: Secondary | ICD-10-CM | POA: Diagnosis not present

## 2016-01-17 DIAGNOSIS — N2581 Secondary hyperparathyroidism of renal origin: Secondary | ICD-10-CM | POA: Diagnosis not present

## 2016-01-17 DIAGNOSIS — N186 End stage renal disease: Secondary | ICD-10-CM | POA: Diagnosis not present

## 2016-01-19 DIAGNOSIS — D631 Anemia in chronic kidney disease: Secondary | ICD-10-CM | POA: Diagnosis not present

## 2016-01-19 DIAGNOSIS — D509 Iron deficiency anemia, unspecified: Secondary | ICD-10-CM | POA: Diagnosis not present

## 2016-01-19 DIAGNOSIS — E1121 Type 2 diabetes mellitus with diabetic nephropathy: Secondary | ICD-10-CM | POA: Diagnosis not present

## 2016-01-19 DIAGNOSIS — N186 End stage renal disease: Secondary | ICD-10-CM | POA: Diagnosis not present

## 2016-01-19 DIAGNOSIS — E8770 Fluid overload, unspecified: Secondary | ICD-10-CM | POA: Diagnosis not present

## 2016-01-19 DIAGNOSIS — N2581 Secondary hyperparathyroidism of renal origin: Secondary | ICD-10-CM | POA: Diagnosis not present

## 2016-01-22 DIAGNOSIS — E1121 Type 2 diabetes mellitus with diabetic nephropathy: Secondary | ICD-10-CM | POA: Diagnosis not present

## 2016-01-22 DIAGNOSIS — N186 End stage renal disease: Secondary | ICD-10-CM | POA: Diagnosis not present

## 2016-01-22 DIAGNOSIS — N2581 Secondary hyperparathyroidism of renal origin: Secondary | ICD-10-CM | POA: Diagnosis not present

## 2016-01-22 DIAGNOSIS — D509 Iron deficiency anemia, unspecified: Secondary | ICD-10-CM | POA: Diagnosis not present

## 2016-01-22 DIAGNOSIS — D631 Anemia in chronic kidney disease: Secondary | ICD-10-CM | POA: Diagnosis not present

## 2016-01-22 DIAGNOSIS — E8770 Fluid overload, unspecified: Secondary | ICD-10-CM | POA: Diagnosis not present

## 2016-01-24 DIAGNOSIS — D509 Iron deficiency anemia, unspecified: Secondary | ICD-10-CM | POA: Diagnosis not present

## 2016-01-24 DIAGNOSIS — E1121 Type 2 diabetes mellitus with diabetic nephropathy: Secondary | ICD-10-CM | POA: Diagnosis not present

## 2016-01-24 DIAGNOSIS — D631 Anemia in chronic kidney disease: Secondary | ICD-10-CM | POA: Diagnosis not present

## 2016-01-24 DIAGNOSIS — N2581 Secondary hyperparathyroidism of renal origin: Secondary | ICD-10-CM | POA: Diagnosis not present

## 2016-01-24 DIAGNOSIS — E8770 Fluid overload, unspecified: Secondary | ICD-10-CM | POA: Diagnosis not present

## 2016-01-24 DIAGNOSIS — N186 End stage renal disease: Secondary | ICD-10-CM | POA: Diagnosis not present

## 2016-01-26 DIAGNOSIS — D631 Anemia in chronic kidney disease: Secondary | ICD-10-CM | POA: Diagnosis not present

## 2016-01-26 DIAGNOSIS — D509 Iron deficiency anemia, unspecified: Secondary | ICD-10-CM | POA: Diagnosis not present

## 2016-01-26 DIAGNOSIS — E1121 Type 2 diabetes mellitus with diabetic nephropathy: Secondary | ICD-10-CM | POA: Diagnosis not present

## 2016-01-26 DIAGNOSIS — N186 End stage renal disease: Secondary | ICD-10-CM | POA: Diagnosis not present

## 2016-01-26 DIAGNOSIS — E8770 Fluid overload, unspecified: Secondary | ICD-10-CM | POA: Diagnosis not present

## 2016-01-26 DIAGNOSIS — N2581 Secondary hyperparathyroidism of renal origin: Secondary | ICD-10-CM | POA: Diagnosis not present

## 2016-01-29 DIAGNOSIS — N2581 Secondary hyperparathyroidism of renal origin: Secondary | ICD-10-CM | POA: Diagnosis not present

## 2016-01-29 DIAGNOSIS — D509 Iron deficiency anemia, unspecified: Secondary | ICD-10-CM | POA: Diagnosis not present

## 2016-01-29 DIAGNOSIS — E1121 Type 2 diabetes mellitus with diabetic nephropathy: Secondary | ICD-10-CM | POA: Diagnosis not present

## 2016-01-29 DIAGNOSIS — D631 Anemia in chronic kidney disease: Secondary | ICD-10-CM | POA: Diagnosis not present

## 2016-01-29 DIAGNOSIS — E8770 Fluid overload, unspecified: Secondary | ICD-10-CM | POA: Diagnosis not present

## 2016-01-29 DIAGNOSIS — N186 End stage renal disease: Secondary | ICD-10-CM | POA: Diagnosis not present

## 2016-01-31 DIAGNOSIS — E8770 Fluid overload, unspecified: Secondary | ICD-10-CM | POA: Diagnosis not present

## 2016-01-31 DIAGNOSIS — D509 Iron deficiency anemia, unspecified: Secondary | ICD-10-CM | POA: Diagnosis not present

## 2016-01-31 DIAGNOSIS — N186 End stage renal disease: Secondary | ICD-10-CM | POA: Diagnosis not present

## 2016-01-31 DIAGNOSIS — D631 Anemia in chronic kidney disease: Secondary | ICD-10-CM | POA: Diagnosis not present

## 2016-01-31 DIAGNOSIS — E1121 Type 2 diabetes mellitus with diabetic nephropathy: Secondary | ICD-10-CM | POA: Diagnosis not present

## 2016-01-31 DIAGNOSIS — N2581 Secondary hyperparathyroidism of renal origin: Secondary | ICD-10-CM | POA: Diagnosis not present

## 2016-02-02 DIAGNOSIS — D509 Iron deficiency anemia, unspecified: Secondary | ICD-10-CM | POA: Diagnosis not present

## 2016-02-02 DIAGNOSIS — D631 Anemia in chronic kidney disease: Secondary | ICD-10-CM | POA: Diagnosis not present

## 2016-02-02 DIAGNOSIS — E1121 Type 2 diabetes mellitus with diabetic nephropathy: Secondary | ICD-10-CM | POA: Diagnosis not present

## 2016-02-02 DIAGNOSIS — E8770 Fluid overload, unspecified: Secondary | ICD-10-CM | POA: Diagnosis not present

## 2016-02-02 DIAGNOSIS — N186 End stage renal disease: Secondary | ICD-10-CM | POA: Diagnosis not present

## 2016-02-02 DIAGNOSIS — N2581 Secondary hyperparathyroidism of renal origin: Secondary | ICD-10-CM | POA: Diagnosis not present

## 2016-02-05 DIAGNOSIS — N186 End stage renal disease: Secondary | ICD-10-CM | POA: Diagnosis not present

## 2016-02-05 DIAGNOSIS — E8770 Fluid overload, unspecified: Secondary | ICD-10-CM | POA: Diagnosis not present

## 2016-02-05 DIAGNOSIS — D509 Iron deficiency anemia, unspecified: Secondary | ICD-10-CM | POA: Diagnosis not present

## 2016-02-05 DIAGNOSIS — N2581 Secondary hyperparathyroidism of renal origin: Secondary | ICD-10-CM | POA: Diagnosis not present

## 2016-02-05 DIAGNOSIS — E1121 Type 2 diabetes mellitus with diabetic nephropathy: Secondary | ICD-10-CM | POA: Diagnosis not present

## 2016-02-05 DIAGNOSIS — D631 Anemia in chronic kidney disease: Secondary | ICD-10-CM | POA: Diagnosis not present

## 2016-02-07 DIAGNOSIS — D509 Iron deficiency anemia, unspecified: Secondary | ICD-10-CM | POA: Diagnosis not present

## 2016-02-07 DIAGNOSIS — N2581 Secondary hyperparathyroidism of renal origin: Secondary | ICD-10-CM | POA: Diagnosis not present

## 2016-02-07 DIAGNOSIS — E1121 Type 2 diabetes mellitus with diabetic nephropathy: Secondary | ICD-10-CM | POA: Diagnosis not present

## 2016-02-07 DIAGNOSIS — N186 End stage renal disease: Secondary | ICD-10-CM | POA: Diagnosis not present

## 2016-02-07 DIAGNOSIS — E8770 Fluid overload, unspecified: Secondary | ICD-10-CM | POA: Diagnosis not present

## 2016-02-07 DIAGNOSIS — D631 Anemia in chronic kidney disease: Secondary | ICD-10-CM | POA: Diagnosis not present

## 2016-02-09 DIAGNOSIS — N186 End stage renal disease: Secondary | ICD-10-CM | POA: Diagnosis not present

## 2016-02-09 DIAGNOSIS — D631 Anemia in chronic kidney disease: Secondary | ICD-10-CM | POA: Diagnosis not present

## 2016-02-09 DIAGNOSIS — E1121 Type 2 diabetes mellitus with diabetic nephropathy: Secondary | ICD-10-CM | POA: Diagnosis not present

## 2016-02-09 DIAGNOSIS — E8770 Fluid overload, unspecified: Secondary | ICD-10-CM | POA: Diagnosis not present

## 2016-02-09 DIAGNOSIS — N2581 Secondary hyperparathyroidism of renal origin: Secondary | ICD-10-CM | POA: Diagnosis not present

## 2016-02-09 DIAGNOSIS — D509 Iron deficiency anemia, unspecified: Secondary | ICD-10-CM | POA: Diagnosis not present

## 2016-02-12 DIAGNOSIS — E1121 Type 2 diabetes mellitus with diabetic nephropathy: Secondary | ICD-10-CM | POA: Diagnosis not present

## 2016-02-12 DIAGNOSIS — D509 Iron deficiency anemia, unspecified: Secondary | ICD-10-CM | POA: Diagnosis not present

## 2016-02-12 DIAGNOSIS — N2581 Secondary hyperparathyroidism of renal origin: Secondary | ICD-10-CM | POA: Diagnosis not present

## 2016-02-12 DIAGNOSIS — D631 Anemia in chronic kidney disease: Secondary | ICD-10-CM | POA: Diagnosis not present

## 2016-02-12 DIAGNOSIS — E8770 Fluid overload, unspecified: Secondary | ICD-10-CM | POA: Diagnosis not present

## 2016-02-12 DIAGNOSIS — N186 End stage renal disease: Secondary | ICD-10-CM | POA: Diagnosis not present

## 2016-02-13 DIAGNOSIS — N2581 Secondary hyperparathyroidism of renal origin: Secondary | ICD-10-CM | POA: Diagnosis not present

## 2016-02-13 DIAGNOSIS — E1121 Type 2 diabetes mellitus with diabetic nephropathy: Secondary | ICD-10-CM | POA: Diagnosis not present

## 2016-02-13 DIAGNOSIS — N186 End stage renal disease: Secondary | ICD-10-CM | POA: Diagnosis not present

## 2016-02-13 DIAGNOSIS — E8770 Fluid overload, unspecified: Secondary | ICD-10-CM | POA: Diagnosis not present

## 2016-02-13 DIAGNOSIS — D509 Iron deficiency anemia, unspecified: Secondary | ICD-10-CM | POA: Diagnosis not present

## 2016-02-13 DIAGNOSIS — D631 Anemia in chronic kidney disease: Secondary | ICD-10-CM | POA: Diagnosis not present

## 2016-02-14 DIAGNOSIS — D509 Iron deficiency anemia, unspecified: Secondary | ICD-10-CM | POA: Diagnosis not present

## 2016-02-14 DIAGNOSIS — D631 Anemia in chronic kidney disease: Secondary | ICD-10-CM | POA: Diagnosis not present

## 2016-02-14 DIAGNOSIS — N2581 Secondary hyperparathyroidism of renal origin: Secondary | ICD-10-CM | POA: Diagnosis not present

## 2016-02-14 DIAGNOSIS — E8770 Fluid overload, unspecified: Secondary | ICD-10-CM | POA: Diagnosis not present

## 2016-02-14 DIAGNOSIS — E1121 Type 2 diabetes mellitus with diabetic nephropathy: Secondary | ICD-10-CM | POA: Diagnosis not present

## 2016-02-14 DIAGNOSIS — N186 End stage renal disease: Secondary | ICD-10-CM | POA: Diagnosis not present

## 2016-02-15 DIAGNOSIS — E1122 Type 2 diabetes mellitus with diabetic chronic kidney disease: Secondary | ICD-10-CM | POA: Diagnosis not present

## 2016-02-15 DIAGNOSIS — Z992 Dependence on renal dialysis: Secondary | ICD-10-CM | POA: Diagnosis not present

## 2016-02-15 DIAGNOSIS — N186 End stage renal disease: Secondary | ICD-10-CM | POA: Diagnosis not present

## 2016-02-16 DIAGNOSIS — N2581 Secondary hyperparathyroidism of renal origin: Secondary | ICD-10-CM | POA: Diagnosis not present

## 2016-02-16 DIAGNOSIS — E1121 Type 2 diabetes mellitus with diabetic nephropathy: Secondary | ICD-10-CM | POA: Diagnosis not present

## 2016-02-16 DIAGNOSIS — Z23 Encounter for immunization: Secondary | ICD-10-CM | POA: Diagnosis not present

## 2016-02-16 DIAGNOSIS — N186 End stage renal disease: Secondary | ICD-10-CM | POA: Diagnosis not present

## 2016-02-19 DIAGNOSIS — N2581 Secondary hyperparathyroidism of renal origin: Secondary | ICD-10-CM | POA: Diagnosis not present

## 2016-02-19 DIAGNOSIS — N186 End stage renal disease: Secondary | ICD-10-CM | POA: Diagnosis not present

## 2016-02-19 DIAGNOSIS — Z23 Encounter for immunization: Secondary | ICD-10-CM | POA: Diagnosis not present

## 2016-02-19 DIAGNOSIS — E1121 Type 2 diabetes mellitus with diabetic nephropathy: Secondary | ICD-10-CM | POA: Diagnosis not present

## 2016-02-21 DIAGNOSIS — E1121 Type 2 diabetes mellitus with diabetic nephropathy: Secondary | ICD-10-CM | POA: Diagnosis not present

## 2016-02-21 DIAGNOSIS — N186 End stage renal disease: Secondary | ICD-10-CM | POA: Diagnosis not present

## 2016-02-21 DIAGNOSIS — Z23 Encounter for immunization: Secondary | ICD-10-CM | POA: Diagnosis not present

## 2016-02-21 DIAGNOSIS — N2581 Secondary hyperparathyroidism of renal origin: Secondary | ICD-10-CM | POA: Diagnosis not present

## 2016-02-23 DIAGNOSIS — E1121 Type 2 diabetes mellitus with diabetic nephropathy: Secondary | ICD-10-CM | POA: Diagnosis not present

## 2016-02-23 DIAGNOSIS — Z23 Encounter for immunization: Secondary | ICD-10-CM | POA: Diagnosis not present

## 2016-02-23 DIAGNOSIS — N186 End stage renal disease: Secondary | ICD-10-CM | POA: Diagnosis not present

## 2016-02-23 DIAGNOSIS — N2581 Secondary hyperparathyroidism of renal origin: Secondary | ICD-10-CM | POA: Diagnosis not present

## 2016-02-26 DIAGNOSIS — N2581 Secondary hyperparathyroidism of renal origin: Secondary | ICD-10-CM | POA: Diagnosis not present

## 2016-02-26 DIAGNOSIS — Z23 Encounter for immunization: Secondary | ICD-10-CM | POA: Diagnosis not present

## 2016-02-26 DIAGNOSIS — N186 End stage renal disease: Secondary | ICD-10-CM | POA: Diagnosis not present

## 2016-02-26 DIAGNOSIS — E1121 Type 2 diabetes mellitus with diabetic nephropathy: Secondary | ICD-10-CM | POA: Diagnosis not present

## 2016-02-28 DIAGNOSIS — E1121 Type 2 diabetes mellitus with diabetic nephropathy: Secondary | ICD-10-CM | POA: Diagnosis not present

## 2016-02-28 DIAGNOSIS — Z23 Encounter for immunization: Secondary | ICD-10-CM | POA: Diagnosis not present

## 2016-02-28 DIAGNOSIS — N2581 Secondary hyperparathyroidism of renal origin: Secondary | ICD-10-CM | POA: Diagnosis not present

## 2016-02-28 DIAGNOSIS — N186 End stage renal disease: Secondary | ICD-10-CM | POA: Diagnosis not present

## 2016-03-01 DIAGNOSIS — E1121 Type 2 diabetes mellitus with diabetic nephropathy: Secondary | ICD-10-CM | POA: Diagnosis not present

## 2016-03-01 DIAGNOSIS — N2581 Secondary hyperparathyroidism of renal origin: Secondary | ICD-10-CM | POA: Diagnosis not present

## 2016-03-01 DIAGNOSIS — N186 End stage renal disease: Secondary | ICD-10-CM | POA: Diagnosis not present

## 2016-03-01 DIAGNOSIS — Z23 Encounter for immunization: Secondary | ICD-10-CM | POA: Diagnosis not present

## 2016-03-04 DIAGNOSIS — Z23 Encounter for immunization: Secondary | ICD-10-CM | POA: Diagnosis not present

## 2016-03-04 DIAGNOSIS — N2581 Secondary hyperparathyroidism of renal origin: Secondary | ICD-10-CM | POA: Diagnosis not present

## 2016-03-04 DIAGNOSIS — N186 End stage renal disease: Secondary | ICD-10-CM | POA: Diagnosis not present

## 2016-03-04 DIAGNOSIS — E1121 Type 2 diabetes mellitus with diabetic nephropathy: Secondary | ICD-10-CM | POA: Diagnosis not present

## 2016-03-06 DIAGNOSIS — N186 End stage renal disease: Secondary | ICD-10-CM | POA: Diagnosis not present

## 2016-03-06 DIAGNOSIS — E1121 Type 2 diabetes mellitus with diabetic nephropathy: Secondary | ICD-10-CM | POA: Diagnosis not present

## 2016-03-06 DIAGNOSIS — Z23 Encounter for immunization: Secondary | ICD-10-CM | POA: Diagnosis not present

## 2016-03-06 DIAGNOSIS — N2581 Secondary hyperparathyroidism of renal origin: Secondary | ICD-10-CM | POA: Diagnosis not present

## 2016-03-08 DIAGNOSIS — N186 End stage renal disease: Secondary | ICD-10-CM | POA: Diagnosis not present

## 2016-03-08 DIAGNOSIS — E1121 Type 2 diabetes mellitus with diabetic nephropathy: Secondary | ICD-10-CM | POA: Diagnosis not present

## 2016-03-08 DIAGNOSIS — N2581 Secondary hyperparathyroidism of renal origin: Secondary | ICD-10-CM | POA: Diagnosis not present

## 2016-03-08 DIAGNOSIS — Z23 Encounter for immunization: Secondary | ICD-10-CM | POA: Diagnosis not present

## 2016-03-11 DIAGNOSIS — Z23 Encounter for immunization: Secondary | ICD-10-CM | POA: Diagnosis not present

## 2016-03-11 DIAGNOSIS — E1121 Type 2 diabetes mellitus with diabetic nephropathy: Secondary | ICD-10-CM | POA: Diagnosis not present

## 2016-03-11 DIAGNOSIS — N2581 Secondary hyperparathyroidism of renal origin: Secondary | ICD-10-CM | POA: Diagnosis not present

## 2016-03-11 DIAGNOSIS — N186 End stage renal disease: Secondary | ICD-10-CM | POA: Diagnosis not present

## 2016-03-12 ENCOUNTER — Other Ambulatory Visit: Payer: Self-pay | Admitting: Family Medicine

## 2016-03-12 DIAGNOSIS — E785 Hyperlipidemia, unspecified: Secondary | ICD-10-CM

## 2016-03-13 DIAGNOSIS — Z23 Encounter for immunization: Secondary | ICD-10-CM | POA: Diagnosis not present

## 2016-03-13 DIAGNOSIS — N186 End stage renal disease: Secondary | ICD-10-CM | POA: Diagnosis not present

## 2016-03-13 DIAGNOSIS — E1121 Type 2 diabetes mellitus with diabetic nephropathy: Secondary | ICD-10-CM | POA: Diagnosis not present

## 2016-03-13 DIAGNOSIS — N2581 Secondary hyperparathyroidism of renal origin: Secondary | ICD-10-CM | POA: Diagnosis not present

## 2016-03-15 DIAGNOSIS — Z23 Encounter for immunization: Secondary | ICD-10-CM | POA: Diagnosis not present

## 2016-03-15 DIAGNOSIS — N2581 Secondary hyperparathyroidism of renal origin: Secondary | ICD-10-CM | POA: Diagnosis not present

## 2016-03-15 DIAGNOSIS — E1121 Type 2 diabetes mellitus with diabetic nephropathy: Secondary | ICD-10-CM | POA: Diagnosis not present

## 2016-03-15 DIAGNOSIS — N186 End stage renal disease: Secondary | ICD-10-CM | POA: Diagnosis not present

## 2016-03-16 DIAGNOSIS — E1122 Type 2 diabetes mellitus with diabetic chronic kidney disease: Secondary | ICD-10-CM | POA: Diagnosis not present

## 2016-03-16 DIAGNOSIS — Z992 Dependence on renal dialysis: Secondary | ICD-10-CM | POA: Diagnosis not present

## 2016-03-16 DIAGNOSIS — N186 End stage renal disease: Secondary | ICD-10-CM | POA: Diagnosis not present

## 2016-03-18 DIAGNOSIS — D631 Anemia in chronic kidney disease: Secondary | ICD-10-CM | POA: Diagnosis not present

## 2016-03-18 DIAGNOSIS — N186 End stage renal disease: Secondary | ICD-10-CM | POA: Diagnosis not present

## 2016-03-18 DIAGNOSIS — Z23 Encounter for immunization: Secondary | ICD-10-CM | POA: Diagnosis not present

## 2016-03-18 DIAGNOSIS — N2581 Secondary hyperparathyroidism of renal origin: Secondary | ICD-10-CM | POA: Diagnosis not present

## 2016-03-20 DIAGNOSIS — N186 End stage renal disease: Secondary | ICD-10-CM | POA: Diagnosis not present

## 2016-03-20 DIAGNOSIS — Z23 Encounter for immunization: Secondary | ICD-10-CM | POA: Diagnosis not present

## 2016-03-20 DIAGNOSIS — N2581 Secondary hyperparathyroidism of renal origin: Secondary | ICD-10-CM | POA: Diagnosis not present

## 2016-03-20 DIAGNOSIS — D631 Anemia in chronic kidney disease: Secondary | ICD-10-CM | POA: Diagnosis not present

## 2016-03-22 DIAGNOSIS — Z23 Encounter for immunization: Secondary | ICD-10-CM | POA: Diagnosis not present

## 2016-03-22 DIAGNOSIS — N186 End stage renal disease: Secondary | ICD-10-CM | POA: Diagnosis not present

## 2016-03-22 DIAGNOSIS — D631 Anemia in chronic kidney disease: Secondary | ICD-10-CM | POA: Diagnosis not present

## 2016-03-22 DIAGNOSIS — N2581 Secondary hyperparathyroidism of renal origin: Secondary | ICD-10-CM | POA: Diagnosis not present

## 2016-03-25 DIAGNOSIS — N2581 Secondary hyperparathyroidism of renal origin: Secondary | ICD-10-CM | POA: Diagnosis not present

## 2016-03-25 DIAGNOSIS — D631 Anemia in chronic kidney disease: Secondary | ICD-10-CM | POA: Diagnosis not present

## 2016-03-25 DIAGNOSIS — N186 End stage renal disease: Secondary | ICD-10-CM | POA: Diagnosis not present

## 2016-03-25 DIAGNOSIS — Z23 Encounter for immunization: Secondary | ICD-10-CM | POA: Diagnosis not present

## 2016-03-27 DIAGNOSIS — N186 End stage renal disease: Secondary | ICD-10-CM | POA: Diagnosis not present

## 2016-03-27 DIAGNOSIS — Z23 Encounter for immunization: Secondary | ICD-10-CM | POA: Diagnosis not present

## 2016-03-27 DIAGNOSIS — D631 Anemia in chronic kidney disease: Secondary | ICD-10-CM | POA: Diagnosis not present

## 2016-03-27 DIAGNOSIS — N2581 Secondary hyperparathyroidism of renal origin: Secondary | ICD-10-CM | POA: Diagnosis not present

## 2016-03-29 DIAGNOSIS — D631 Anemia in chronic kidney disease: Secondary | ICD-10-CM | POA: Diagnosis not present

## 2016-03-29 DIAGNOSIS — N186 End stage renal disease: Secondary | ICD-10-CM | POA: Diagnosis not present

## 2016-03-29 DIAGNOSIS — N2581 Secondary hyperparathyroidism of renal origin: Secondary | ICD-10-CM | POA: Diagnosis not present

## 2016-03-29 DIAGNOSIS — Z23 Encounter for immunization: Secondary | ICD-10-CM | POA: Diagnosis not present

## 2016-04-01 DIAGNOSIS — N186 End stage renal disease: Secondary | ICD-10-CM | POA: Diagnosis not present

## 2016-04-01 DIAGNOSIS — N2581 Secondary hyperparathyroidism of renal origin: Secondary | ICD-10-CM | POA: Diagnosis not present

## 2016-04-01 DIAGNOSIS — Z23 Encounter for immunization: Secondary | ICD-10-CM | POA: Diagnosis not present

## 2016-04-01 DIAGNOSIS — D631 Anemia in chronic kidney disease: Secondary | ICD-10-CM | POA: Diagnosis not present

## 2016-04-02 ENCOUNTER — Encounter: Payer: Self-pay | Admitting: Internal Medicine

## 2016-04-02 ENCOUNTER — Ambulatory Visit (INDEPENDENT_AMBULATORY_CARE_PROVIDER_SITE_OTHER): Payer: Medicare Other | Admitting: Internal Medicine

## 2016-04-02 VITALS — BP 151/77 | HR 79 | Temp 98.2°F | Wt 258.0 lb

## 2016-04-02 DIAGNOSIS — G629 Polyneuropathy, unspecified: Secondary | ICD-10-CM

## 2016-04-02 DIAGNOSIS — E1349 Other specified diabetes mellitus with other diabetic neurological complication: Secondary | ICD-10-CM | POA: Diagnosis not present

## 2016-04-02 DIAGNOSIS — E119 Type 2 diabetes mellitus without complications: Secondary | ICD-10-CM

## 2016-04-02 DIAGNOSIS — E1140 Type 2 diabetes mellitus with diabetic neuropathy, unspecified: Secondary | ICD-10-CM | POA: Insufficient documentation

## 2016-04-02 LAB — POCT GLYCOSYLATED HEMOGLOBIN (HGB A1C): Hemoglobin A1C: 6.1

## 2016-04-02 MED ORDER — GABAPENTIN 300 MG PO CAPS: 300.0000 mg | ORAL_CAPSULE | Freq: Three times a day (TID) | ORAL | 2 refills | Status: DC

## 2016-04-02 NOTE — Assessment & Plan Note (Signed)
Patient presenting with bilateral shooting pain, numbness, and tingling of his toes bilaterally. Likely a worsening of his diabetic neuropathy. No edema or gross deformity noted. Diabetic foot exam performed in clinic today and was notable for decreased sensation to touch and monofilament testing in the toes and distal forefoot of the bilateral feet. Last A1c was 5.7% on 12/27/2015. - Will increase gabapentin dose from 300mg  daily at bedtime to 300 mg three times a day. - Will check an A1c today to make sure he is not having worsening control of his diabetes - Advised patient to follow-up with his PCP in the next 2-3 weeks for follow-up of his diabetes, hypertension, and other chronic medical conditions.

## 2016-04-02 NOTE — Progress Notes (Signed)
   [facility] Phone: [phone]  Subjective:  Troy Wells is a 50 year old male presenting to clinic with bilateral foot pain, numbness, and tingling. He states it feels like a worsening of his diabetic neuropathy. He describes the pain as "shooting pain". The pain and numbness are mostly located in the toes and the distal part of the forefoot. He states this has been going on for a year. His last A1c was 5.7%. He has had diabetes for 5 years. He takes gabapentin 300 mg at night. He states the gabapentin used to work when he first started taking it, but has stopped working recently. He denies any fevers, chills, spreading redness, or swelling.  ROS: See HPI for pertinent positives and negatives  Past Medical History- hypertension, CAD, congestive heart failure, OSA, diabetes, ESRD on dialysis, hyperlipidemia  Family history reviewed for today's visit. No changes.  Social history- patient is a former smoker. Quit in 2013.  Objective: BP (!) 151/77   Pulse 79   Temp 98.2 F (36.8 C) (Oral)   Wt 258 lb (117 kg)   BMI 34.99 kg/m  Gen: NAD, alert, cooperative with exam HEENT: NCAT, EOMI, MMM Resp: Normal work of breathing Msk: No edema of the lower extremities. Diabetic Foot Exam: No deformities, no ulcerations, no other skin breakdown bilaterally. Decreased sensation to touch and monofilament testing in the toes and distal forefoot bilaterally. Sensation intact to monofilament testing around the area of the calcaneus bilaterally. PT and DP pulses intact bilaterally.  Assessment/Plan: Diabetic Neuropathy: Patient presenting with bilateral shooting pain, numbness, and tingling of his toes bilaterally. Likely a worsening of his diabetic neuropathy. No edema or gross deformity noted. Diabetic foot exam performed in clinic today and was notable for decreased sensation to touch and monofilament testing in the toes and distal forefoot of the bilateral feet. Last A1c was  5.7% on 12/27/2015. - Will increase gabapentin dose from 300mg  daily at bedtime to 300 mg three times a day. - Will check an A1c today to make sure he is not having worsening control of his diabetes - Advised patient to follow-up with his PCP in the next 2-3 weeks for follow-up of his diabetes, hypertension, and other chronic medical conditions.   Hyman Bible, MD PGY-2

## 2016-04-02 NOTE — Patient Instructions (Addendum)
It was so nice to meet you!  Your foot exam showed that you are having some numbness of your toes on both feet.  It is very important that you check your feet every day (in between your toes, the tops of your feet, and the bottoms of your feet). We would want to see you in clinic if you develop any sores on your feet.  Please start taking Gabapentin 300mg  (1 tablet) three times a day.  I would recommend following up with Dr. Ree Kida in the next few weeks, because you have not seen him for a while.  -Dr. Brett Albino

## 2016-04-03 DIAGNOSIS — N186 End stage renal disease: Secondary | ICD-10-CM | POA: Diagnosis not present

## 2016-04-03 DIAGNOSIS — D631 Anemia in chronic kidney disease: Secondary | ICD-10-CM | POA: Diagnosis not present

## 2016-04-03 DIAGNOSIS — N2581 Secondary hyperparathyroidism of renal origin: Secondary | ICD-10-CM | POA: Diagnosis not present

## 2016-04-03 DIAGNOSIS — Z23 Encounter for immunization: Secondary | ICD-10-CM | POA: Diagnosis not present

## 2016-04-05 DIAGNOSIS — Z23 Encounter for immunization: Secondary | ICD-10-CM | POA: Diagnosis not present

## 2016-04-05 DIAGNOSIS — D631 Anemia in chronic kidney disease: Secondary | ICD-10-CM | POA: Diagnosis not present

## 2016-04-05 DIAGNOSIS — N186 End stage renal disease: Secondary | ICD-10-CM | POA: Diagnosis not present

## 2016-04-05 DIAGNOSIS — N2581 Secondary hyperparathyroidism of renal origin: Secondary | ICD-10-CM | POA: Diagnosis not present

## 2016-04-08 DIAGNOSIS — Z23 Encounter for immunization: Secondary | ICD-10-CM | POA: Diagnosis not present

## 2016-04-08 DIAGNOSIS — D631 Anemia in chronic kidney disease: Secondary | ICD-10-CM | POA: Diagnosis not present

## 2016-04-08 DIAGNOSIS — N2581 Secondary hyperparathyroidism of renal origin: Secondary | ICD-10-CM | POA: Diagnosis not present

## 2016-04-08 DIAGNOSIS — N186 End stage renal disease: Secondary | ICD-10-CM | POA: Diagnosis not present

## 2016-04-10 DIAGNOSIS — Z23 Encounter for immunization: Secondary | ICD-10-CM | POA: Diagnosis not present

## 2016-04-10 DIAGNOSIS — N2581 Secondary hyperparathyroidism of renal origin: Secondary | ICD-10-CM | POA: Diagnosis not present

## 2016-04-10 DIAGNOSIS — E1121 Type 2 diabetes mellitus with diabetic nephropathy: Secondary | ICD-10-CM | POA: Diagnosis not present

## 2016-04-10 DIAGNOSIS — N186 End stage renal disease: Secondary | ICD-10-CM | POA: Diagnosis not present

## 2016-04-10 DIAGNOSIS — D631 Anemia in chronic kidney disease: Secondary | ICD-10-CM | POA: Diagnosis not present

## 2016-04-12 DIAGNOSIS — Z23 Encounter for immunization: Secondary | ICD-10-CM | POA: Diagnosis not present

## 2016-04-12 DIAGNOSIS — N186 End stage renal disease: Secondary | ICD-10-CM | POA: Diagnosis not present

## 2016-04-12 DIAGNOSIS — N2581 Secondary hyperparathyroidism of renal origin: Secondary | ICD-10-CM | POA: Diagnosis not present

## 2016-04-12 DIAGNOSIS — D631 Anemia in chronic kidney disease: Secondary | ICD-10-CM | POA: Diagnosis not present

## 2016-04-15 DIAGNOSIS — Z23 Encounter for immunization: Secondary | ICD-10-CM | POA: Diagnosis not present

## 2016-04-15 DIAGNOSIS — N2581 Secondary hyperparathyroidism of renal origin: Secondary | ICD-10-CM | POA: Diagnosis not present

## 2016-04-15 DIAGNOSIS — D631 Anemia in chronic kidney disease: Secondary | ICD-10-CM | POA: Diagnosis not present

## 2016-04-15 DIAGNOSIS — N186 End stage renal disease: Secondary | ICD-10-CM | POA: Diagnosis not present

## 2016-04-16 DIAGNOSIS — E1122 Type 2 diabetes mellitus with diabetic chronic kidney disease: Secondary | ICD-10-CM | POA: Diagnosis not present

## 2016-04-16 DIAGNOSIS — N186 End stage renal disease: Secondary | ICD-10-CM | POA: Diagnosis not present

## 2016-04-16 DIAGNOSIS — Z992 Dependence on renal dialysis: Secondary | ICD-10-CM | POA: Diagnosis not present

## 2016-04-17 DIAGNOSIS — D631 Anemia in chronic kidney disease: Secondary | ICD-10-CM | POA: Diagnosis not present

## 2016-04-17 DIAGNOSIS — D509 Iron deficiency anemia, unspecified: Secondary | ICD-10-CM | POA: Diagnosis not present

## 2016-04-17 DIAGNOSIS — N2581 Secondary hyperparathyroidism of renal origin: Secondary | ICD-10-CM | POA: Diagnosis not present

## 2016-04-17 DIAGNOSIS — N186 End stage renal disease: Secondary | ICD-10-CM | POA: Diagnosis not present

## 2016-04-19 DIAGNOSIS — D631 Anemia in chronic kidney disease: Secondary | ICD-10-CM | POA: Diagnosis not present

## 2016-04-19 DIAGNOSIS — D509 Iron deficiency anemia, unspecified: Secondary | ICD-10-CM | POA: Diagnosis not present

## 2016-04-19 DIAGNOSIS — N186 End stage renal disease: Secondary | ICD-10-CM | POA: Diagnosis not present

## 2016-04-19 DIAGNOSIS — N2581 Secondary hyperparathyroidism of renal origin: Secondary | ICD-10-CM | POA: Diagnosis not present

## 2016-04-22 DIAGNOSIS — N2581 Secondary hyperparathyroidism of renal origin: Secondary | ICD-10-CM | POA: Diagnosis not present

## 2016-04-22 DIAGNOSIS — N186 End stage renal disease: Secondary | ICD-10-CM | POA: Diagnosis not present

## 2016-04-22 DIAGNOSIS — D631 Anemia in chronic kidney disease: Secondary | ICD-10-CM | POA: Diagnosis not present

## 2016-04-22 DIAGNOSIS — D509 Iron deficiency anemia, unspecified: Secondary | ICD-10-CM | POA: Diagnosis not present

## 2016-04-24 DIAGNOSIS — D631 Anemia in chronic kidney disease: Secondary | ICD-10-CM | POA: Diagnosis not present

## 2016-04-24 DIAGNOSIS — N2581 Secondary hyperparathyroidism of renal origin: Secondary | ICD-10-CM | POA: Diagnosis not present

## 2016-04-24 DIAGNOSIS — D509 Iron deficiency anemia, unspecified: Secondary | ICD-10-CM | POA: Diagnosis not present

## 2016-04-24 DIAGNOSIS — N186 End stage renal disease: Secondary | ICD-10-CM | POA: Diagnosis not present

## 2016-04-26 DIAGNOSIS — N2581 Secondary hyperparathyroidism of renal origin: Secondary | ICD-10-CM | POA: Diagnosis not present

## 2016-04-26 DIAGNOSIS — D631 Anemia in chronic kidney disease: Secondary | ICD-10-CM | POA: Diagnosis not present

## 2016-04-26 DIAGNOSIS — N186 End stage renal disease: Secondary | ICD-10-CM | POA: Diagnosis not present

## 2016-04-26 DIAGNOSIS — D509 Iron deficiency anemia, unspecified: Secondary | ICD-10-CM | POA: Diagnosis not present

## 2016-04-29 DIAGNOSIS — N2581 Secondary hyperparathyroidism of renal origin: Secondary | ICD-10-CM | POA: Diagnosis not present

## 2016-04-29 DIAGNOSIS — N186 End stage renal disease: Secondary | ICD-10-CM | POA: Diagnosis not present

## 2016-04-29 DIAGNOSIS — D631 Anemia in chronic kidney disease: Secondary | ICD-10-CM | POA: Diagnosis not present

## 2016-04-29 DIAGNOSIS — D509 Iron deficiency anemia, unspecified: Secondary | ICD-10-CM | POA: Diagnosis not present

## 2016-05-01 DIAGNOSIS — D631 Anemia in chronic kidney disease: Secondary | ICD-10-CM | POA: Diagnosis not present

## 2016-05-01 DIAGNOSIS — N186 End stage renal disease: Secondary | ICD-10-CM | POA: Diagnosis not present

## 2016-05-01 DIAGNOSIS — D509 Iron deficiency anemia, unspecified: Secondary | ICD-10-CM | POA: Diagnosis not present

## 2016-05-01 DIAGNOSIS — N2581 Secondary hyperparathyroidism of renal origin: Secondary | ICD-10-CM | POA: Diagnosis not present

## 2016-05-03 DIAGNOSIS — N186 End stage renal disease: Secondary | ICD-10-CM | POA: Diagnosis not present

## 2016-05-03 DIAGNOSIS — N2581 Secondary hyperparathyroidism of renal origin: Secondary | ICD-10-CM | POA: Diagnosis not present

## 2016-05-03 DIAGNOSIS — D509 Iron deficiency anemia, unspecified: Secondary | ICD-10-CM | POA: Diagnosis not present

## 2016-05-03 DIAGNOSIS — D631 Anemia in chronic kidney disease: Secondary | ICD-10-CM | POA: Diagnosis not present

## 2016-05-05 DIAGNOSIS — D509 Iron deficiency anemia, unspecified: Secondary | ICD-10-CM | POA: Diagnosis not present

## 2016-05-05 DIAGNOSIS — N186 End stage renal disease: Secondary | ICD-10-CM | POA: Diagnosis not present

## 2016-05-05 DIAGNOSIS — D631 Anemia in chronic kidney disease: Secondary | ICD-10-CM | POA: Diagnosis not present

## 2016-05-05 DIAGNOSIS — N2581 Secondary hyperparathyroidism of renal origin: Secondary | ICD-10-CM | POA: Diagnosis not present

## 2016-05-07 DIAGNOSIS — D509 Iron deficiency anemia, unspecified: Secondary | ICD-10-CM | POA: Diagnosis not present

## 2016-05-07 DIAGNOSIS — N186 End stage renal disease: Secondary | ICD-10-CM | POA: Diagnosis not present

## 2016-05-07 DIAGNOSIS — N2581 Secondary hyperparathyroidism of renal origin: Secondary | ICD-10-CM | POA: Diagnosis not present

## 2016-05-07 DIAGNOSIS — D631 Anemia in chronic kidney disease: Secondary | ICD-10-CM | POA: Diagnosis not present

## 2016-05-10 DIAGNOSIS — N2581 Secondary hyperparathyroidism of renal origin: Secondary | ICD-10-CM | POA: Diagnosis not present

## 2016-05-10 DIAGNOSIS — D509 Iron deficiency anemia, unspecified: Secondary | ICD-10-CM | POA: Diagnosis not present

## 2016-05-10 DIAGNOSIS — N186 End stage renal disease: Secondary | ICD-10-CM | POA: Diagnosis not present

## 2016-05-10 DIAGNOSIS — D631 Anemia in chronic kidney disease: Secondary | ICD-10-CM | POA: Diagnosis not present

## 2016-05-13 DIAGNOSIS — D509 Iron deficiency anemia, unspecified: Secondary | ICD-10-CM | POA: Diagnosis not present

## 2016-05-13 DIAGNOSIS — D631 Anemia in chronic kidney disease: Secondary | ICD-10-CM | POA: Diagnosis not present

## 2016-05-13 DIAGNOSIS — N2581 Secondary hyperparathyroidism of renal origin: Secondary | ICD-10-CM | POA: Diagnosis not present

## 2016-05-13 DIAGNOSIS — N186 End stage renal disease: Secondary | ICD-10-CM | POA: Diagnosis not present

## 2016-05-15 DIAGNOSIS — D631 Anemia in chronic kidney disease: Secondary | ICD-10-CM | POA: Diagnosis not present

## 2016-05-15 DIAGNOSIS — N186 End stage renal disease: Secondary | ICD-10-CM | POA: Diagnosis not present

## 2016-05-15 DIAGNOSIS — D509 Iron deficiency anemia, unspecified: Secondary | ICD-10-CM | POA: Diagnosis not present

## 2016-05-15 DIAGNOSIS — N2581 Secondary hyperparathyroidism of renal origin: Secondary | ICD-10-CM | POA: Diagnosis not present

## 2016-05-16 DIAGNOSIS — N186 End stage renal disease: Secondary | ICD-10-CM | POA: Diagnosis not present

## 2016-05-16 DIAGNOSIS — E1122 Type 2 diabetes mellitus with diabetic chronic kidney disease: Secondary | ICD-10-CM | POA: Diagnosis not present

## 2016-05-16 DIAGNOSIS — Z992 Dependence on renal dialysis: Secondary | ICD-10-CM | POA: Diagnosis not present

## 2016-05-17 DIAGNOSIS — D509 Iron deficiency anemia, unspecified: Secondary | ICD-10-CM | POA: Diagnosis not present

## 2016-05-17 DIAGNOSIS — N2581 Secondary hyperparathyroidism of renal origin: Secondary | ICD-10-CM | POA: Diagnosis not present

## 2016-05-17 DIAGNOSIS — N186 End stage renal disease: Secondary | ICD-10-CM | POA: Diagnosis not present

## 2016-05-17 DIAGNOSIS — D631 Anemia in chronic kidney disease: Secondary | ICD-10-CM | POA: Diagnosis not present

## 2016-05-17 DIAGNOSIS — E1121 Type 2 diabetes mellitus with diabetic nephropathy: Secondary | ICD-10-CM | POA: Diagnosis not present

## 2016-05-20 DIAGNOSIS — D631 Anemia in chronic kidney disease: Secondary | ICD-10-CM | POA: Diagnosis not present

## 2016-05-20 DIAGNOSIS — D509 Iron deficiency anemia, unspecified: Secondary | ICD-10-CM | POA: Diagnosis not present

## 2016-05-20 DIAGNOSIS — E1121 Type 2 diabetes mellitus with diabetic nephropathy: Secondary | ICD-10-CM | POA: Diagnosis not present

## 2016-05-20 DIAGNOSIS — N2581 Secondary hyperparathyroidism of renal origin: Secondary | ICD-10-CM | POA: Diagnosis not present

## 2016-05-20 DIAGNOSIS — N186 End stage renal disease: Secondary | ICD-10-CM | POA: Diagnosis not present

## 2016-05-22 DIAGNOSIS — E1121 Type 2 diabetes mellitus with diabetic nephropathy: Secondary | ICD-10-CM | POA: Diagnosis not present

## 2016-05-22 DIAGNOSIS — N2581 Secondary hyperparathyroidism of renal origin: Secondary | ICD-10-CM | POA: Diagnosis not present

## 2016-05-22 DIAGNOSIS — N186 End stage renal disease: Secondary | ICD-10-CM | POA: Diagnosis not present

## 2016-05-22 DIAGNOSIS — D631 Anemia in chronic kidney disease: Secondary | ICD-10-CM | POA: Diagnosis not present

## 2016-05-22 DIAGNOSIS — D509 Iron deficiency anemia, unspecified: Secondary | ICD-10-CM | POA: Diagnosis not present

## 2016-05-24 DIAGNOSIS — N186 End stage renal disease: Secondary | ICD-10-CM | POA: Diagnosis not present

## 2016-05-24 DIAGNOSIS — D631 Anemia in chronic kidney disease: Secondary | ICD-10-CM | POA: Diagnosis not present

## 2016-05-24 DIAGNOSIS — D509 Iron deficiency anemia, unspecified: Secondary | ICD-10-CM | POA: Diagnosis not present

## 2016-05-24 DIAGNOSIS — N2581 Secondary hyperparathyroidism of renal origin: Secondary | ICD-10-CM | POA: Diagnosis not present

## 2016-05-24 DIAGNOSIS — E1121 Type 2 diabetes mellitus with diabetic nephropathy: Secondary | ICD-10-CM | POA: Diagnosis not present

## 2016-05-27 DIAGNOSIS — D509 Iron deficiency anemia, unspecified: Secondary | ICD-10-CM | POA: Diagnosis not present

## 2016-05-27 DIAGNOSIS — N186 End stage renal disease: Secondary | ICD-10-CM | POA: Diagnosis not present

## 2016-05-27 DIAGNOSIS — D631 Anemia in chronic kidney disease: Secondary | ICD-10-CM | POA: Diagnosis not present

## 2016-05-27 DIAGNOSIS — E1121 Type 2 diabetes mellitus with diabetic nephropathy: Secondary | ICD-10-CM | POA: Diagnosis not present

## 2016-05-27 DIAGNOSIS — N2581 Secondary hyperparathyroidism of renal origin: Secondary | ICD-10-CM | POA: Diagnosis not present

## 2016-05-29 DIAGNOSIS — D509 Iron deficiency anemia, unspecified: Secondary | ICD-10-CM | POA: Diagnosis not present

## 2016-05-29 DIAGNOSIS — N186 End stage renal disease: Secondary | ICD-10-CM | POA: Diagnosis not present

## 2016-05-29 DIAGNOSIS — D631 Anemia in chronic kidney disease: Secondary | ICD-10-CM | POA: Diagnosis not present

## 2016-05-29 DIAGNOSIS — E1121 Type 2 diabetes mellitus with diabetic nephropathy: Secondary | ICD-10-CM | POA: Diagnosis not present

## 2016-05-29 DIAGNOSIS — N2581 Secondary hyperparathyroidism of renal origin: Secondary | ICD-10-CM | POA: Diagnosis not present

## 2016-05-31 DIAGNOSIS — D509 Iron deficiency anemia, unspecified: Secondary | ICD-10-CM | POA: Diagnosis not present

## 2016-05-31 DIAGNOSIS — D631 Anemia in chronic kidney disease: Secondary | ICD-10-CM | POA: Diagnosis not present

## 2016-05-31 DIAGNOSIS — E1121 Type 2 diabetes mellitus with diabetic nephropathy: Secondary | ICD-10-CM | POA: Diagnosis not present

## 2016-05-31 DIAGNOSIS — N2581 Secondary hyperparathyroidism of renal origin: Secondary | ICD-10-CM | POA: Diagnosis not present

## 2016-05-31 DIAGNOSIS — N186 End stage renal disease: Secondary | ICD-10-CM | POA: Diagnosis not present

## 2016-06-03 DIAGNOSIS — E1121 Type 2 diabetes mellitus with diabetic nephropathy: Secondary | ICD-10-CM | POA: Diagnosis not present

## 2016-06-03 DIAGNOSIS — D631 Anemia in chronic kidney disease: Secondary | ICD-10-CM | POA: Diagnosis not present

## 2016-06-03 DIAGNOSIS — D509 Iron deficiency anemia, unspecified: Secondary | ICD-10-CM | POA: Diagnosis not present

## 2016-06-03 DIAGNOSIS — N186 End stage renal disease: Secondary | ICD-10-CM | POA: Diagnosis not present

## 2016-06-03 DIAGNOSIS — N2581 Secondary hyperparathyroidism of renal origin: Secondary | ICD-10-CM | POA: Diagnosis not present

## 2016-06-05 DIAGNOSIS — D631 Anemia in chronic kidney disease: Secondary | ICD-10-CM | POA: Diagnosis not present

## 2016-06-05 DIAGNOSIS — N186 End stage renal disease: Secondary | ICD-10-CM | POA: Diagnosis not present

## 2016-06-05 DIAGNOSIS — E1121 Type 2 diabetes mellitus with diabetic nephropathy: Secondary | ICD-10-CM | POA: Diagnosis not present

## 2016-06-05 DIAGNOSIS — N2581 Secondary hyperparathyroidism of renal origin: Secondary | ICD-10-CM | POA: Diagnosis not present

## 2016-06-05 DIAGNOSIS — D509 Iron deficiency anemia, unspecified: Secondary | ICD-10-CM | POA: Diagnosis not present

## 2016-06-07 DIAGNOSIS — N2581 Secondary hyperparathyroidism of renal origin: Secondary | ICD-10-CM | POA: Diagnosis not present

## 2016-06-07 DIAGNOSIS — D631 Anemia in chronic kidney disease: Secondary | ICD-10-CM | POA: Diagnosis not present

## 2016-06-07 DIAGNOSIS — E1121 Type 2 diabetes mellitus with diabetic nephropathy: Secondary | ICD-10-CM | POA: Diagnosis not present

## 2016-06-07 DIAGNOSIS — D509 Iron deficiency anemia, unspecified: Secondary | ICD-10-CM | POA: Diagnosis not present

## 2016-06-07 DIAGNOSIS — N186 End stage renal disease: Secondary | ICD-10-CM | POA: Diagnosis not present

## 2016-06-09 DIAGNOSIS — E1121 Type 2 diabetes mellitus with diabetic nephropathy: Secondary | ICD-10-CM | POA: Diagnosis not present

## 2016-06-09 DIAGNOSIS — N2581 Secondary hyperparathyroidism of renal origin: Secondary | ICD-10-CM | POA: Diagnosis not present

## 2016-06-09 DIAGNOSIS — D631 Anemia in chronic kidney disease: Secondary | ICD-10-CM | POA: Diagnosis not present

## 2016-06-09 DIAGNOSIS — N186 End stage renal disease: Secondary | ICD-10-CM | POA: Diagnosis not present

## 2016-06-09 DIAGNOSIS — D509 Iron deficiency anemia, unspecified: Secondary | ICD-10-CM | POA: Diagnosis not present

## 2016-06-12 DIAGNOSIS — N2581 Secondary hyperparathyroidism of renal origin: Secondary | ICD-10-CM | POA: Diagnosis not present

## 2016-06-12 DIAGNOSIS — D509 Iron deficiency anemia, unspecified: Secondary | ICD-10-CM | POA: Diagnosis not present

## 2016-06-12 DIAGNOSIS — E1121 Type 2 diabetes mellitus with diabetic nephropathy: Secondary | ICD-10-CM | POA: Diagnosis not present

## 2016-06-12 DIAGNOSIS — D631 Anemia in chronic kidney disease: Secondary | ICD-10-CM | POA: Diagnosis not present

## 2016-06-12 DIAGNOSIS — N186 End stage renal disease: Secondary | ICD-10-CM | POA: Diagnosis not present

## 2016-06-13 DIAGNOSIS — D631 Anemia in chronic kidney disease: Secondary | ICD-10-CM | POA: Diagnosis not present

## 2016-06-13 DIAGNOSIS — E1121 Type 2 diabetes mellitus with diabetic nephropathy: Secondary | ICD-10-CM | POA: Diagnosis not present

## 2016-06-13 DIAGNOSIS — N186 End stage renal disease: Secondary | ICD-10-CM | POA: Diagnosis not present

## 2016-06-13 DIAGNOSIS — D509 Iron deficiency anemia, unspecified: Secondary | ICD-10-CM | POA: Diagnosis not present

## 2016-06-13 DIAGNOSIS — N2581 Secondary hyperparathyroidism of renal origin: Secondary | ICD-10-CM | POA: Diagnosis not present

## 2016-06-14 DIAGNOSIS — E1121 Type 2 diabetes mellitus with diabetic nephropathy: Secondary | ICD-10-CM | POA: Diagnosis not present

## 2016-06-14 DIAGNOSIS — N186 End stage renal disease: Secondary | ICD-10-CM | POA: Diagnosis not present

## 2016-06-14 DIAGNOSIS — D509 Iron deficiency anemia, unspecified: Secondary | ICD-10-CM | POA: Diagnosis not present

## 2016-06-14 DIAGNOSIS — N2581 Secondary hyperparathyroidism of renal origin: Secondary | ICD-10-CM | POA: Diagnosis not present

## 2016-06-14 DIAGNOSIS — D631 Anemia in chronic kidney disease: Secondary | ICD-10-CM | POA: Diagnosis not present

## 2016-06-16 DIAGNOSIS — N2581 Secondary hyperparathyroidism of renal origin: Secondary | ICD-10-CM | POA: Diagnosis not present

## 2016-06-16 DIAGNOSIS — D631 Anemia in chronic kidney disease: Secondary | ICD-10-CM | POA: Diagnosis not present

## 2016-06-16 DIAGNOSIS — E1122 Type 2 diabetes mellitus with diabetic chronic kidney disease: Secondary | ICD-10-CM | POA: Diagnosis not present

## 2016-06-16 DIAGNOSIS — E1121 Type 2 diabetes mellitus with diabetic nephropathy: Secondary | ICD-10-CM | POA: Diagnosis not present

## 2016-06-16 DIAGNOSIS — Z992 Dependence on renal dialysis: Secondary | ICD-10-CM | POA: Diagnosis not present

## 2016-06-16 DIAGNOSIS — D509 Iron deficiency anemia, unspecified: Secondary | ICD-10-CM | POA: Diagnosis not present

## 2016-06-16 DIAGNOSIS — N186 End stage renal disease: Secondary | ICD-10-CM | POA: Diagnosis not present

## 2016-06-19 DIAGNOSIS — N186 End stage renal disease: Secondary | ICD-10-CM | POA: Diagnosis not present

## 2016-06-19 DIAGNOSIS — N2581 Secondary hyperparathyroidism of renal origin: Secondary | ICD-10-CM | POA: Diagnosis not present

## 2016-06-19 DIAGNOSIS — D509 Iron deficiency anemia, unspecified: Secondary | ICD-10-CM | POA: Diagnosis not present

## 2016-06-19 DIAGNOSIS — D631 Anemia in chronic kidney disease: Secondary | ICD-10-CM | POA: Diagnosis not present

## 2016-06-19 DIAGNOSIS — Z23 Encounter for immunization: Secondary | ICD-10-CM | POA: Diagnosis not present

## 2016-06-19 DIAGNOSIS — E1121 Type 2 diabetes mellitus with diabetic nephropathy: Secondary | ICD-10-CM | POA: Diagnosis not present

## 2016-06-21 DIAGNOSIS — N186 End stage renal disease: Secondary | ICD-10-CM | POA: Diagnosis not present

## 2016-06-21 DIAGNOSIS — D509 Iron deficiency anemia, unspecified: Secondary | ICD-10-CM | POA: Diagnosis not present

## 2016-06-21 DIAGNOSIS — D631 Anemia in chronic kidney disease: Secondary | ICD-10-CM | POA: Diagnosis not present

## 2016-06-21 DIAGNOSIS — N2581 Secondary hyperparathyroidism of renal origin: Secondary | ICD-10-CM | POA: Diagnosis not present

## 2016-06-21 DIAGNOSIS — E1121 Type 2 diabetes mellitus with diabetic nephropathy: Secondary | ICD-10-CM | POA: Diagnosis not present

## 2016-06-21 DIAGNOSIS — Z23 Encounter for immunization: Secondary | ICD-10-CM | POA: Diagnosis not present

## 2016-06-24 DIAGNOSIS — N186 End stage renal disease: Secondary | ICD-10-CM | POA: Diagnosis not present

## 2016-06-24 DIAGNOSIS — D509 Iron deficiency anemia, unspecified: Secondary | ICD-10-CM | POA: Diagnosis not present

## 2016-06-24 DIAGNOSIS — Z23 Encounter for immunization: Secondary | ICD-10-CM | POA: Diagnosis not present

## 2016-06-24 DIAGNOSIS — N2581 Secondary hyperparathyroidism of renal origin: Secondary | ICD-10-CM | POA: Diagnosis not present

## 2016-06-24 DIAGNOSIS — E1121 Type 2 diabetes mellitus with diabetic nephropathy: Secondary | ICD-10-CM | POA: Diagnosis not present

## 2016-06-24 DIAGNOSIS — D631 Anemia in chronic kidney disease: Secondary | ICD-10-CM | POA: Diagnosis not present

## 2016-06-26 DIAGNOSIS — Z23 Encounter for immunization: Secondary | ICD-10-CM | POA: Diagnosis not present

## 2016-06-26 DIAGNOSIS — N2581 Secondary hyperparathyroidism of renal origin: Secondary | ICD-10-CM | POA: Diagnosis not present

## 2016-06-26 DIAGNOSIS — N186 End stage renal disease: Secondary | ICD-10-CM | POA: Diagnosis not present

## 2016-06-26 DIAGNOSIS — D631 Anemia in chronic kidney disease: Secondary | ICD-10-CM | POA: Diagnosis not present

## 2016-06-26 DIAGNOSIS — E1121 Type 2 diabetes mellitus with diabetic nephropathy: Secondary | ICD-10-CM | POA: Diagnosis not present

## 2016-06-26 DIAGNOSIS — D509 Iron deficiency anemia, unspecified: Secondary | ICD-10-CM | POA: Diagnosis not present

## 2016-06-28 DIAGNOSIS — D631 Anemia in chronic kidney disease: Secondary | ICD-10-CM | POA: Diagnosis not present

## 2016-06-28 DIAGNOSIS — N2581 Secondary hyperparathyroidism of renal origin: Secondary | ICD-10-CM | POA: Diagnosis not present

## 2016-06-28 DIAGNOSIS — N186 End stage renal disease: Secondary | ICD-10-CM | POA: Diagnosis not present

## 2016-06-28 DIAGNOSIS — D509 Iron deficiency anemia, unspecified: Secondary | ICD-10-CM | POA: Diagnosis not present

## 2016-06-28 DIAGNOSIS — Z23 Encounter for immunization: Secondary | ICD-10-CM | POA: Diagnosis not present

## 2016-06-28 DIAGNOSIS — E1121 Type 2 diabetes mellitus with diabetic nephropathy: Secondary | ICD-10-CM | POA: Diagnosis not present

## 2016-07-01 DIAGNOSIS — Z23 Encounter for immunization: Secondary | ICD-10-CM | POA: Diagnosis not present

## 2016-07-01 DIAGNOSIS — N2581 Secondary hyperparathyroidism of renal origin: Secondary | ICD-10-CM | POA: Diagnosis not present

## 2016-07-01 DIAGNOSIS — N186 End stage renal disease: Secondary | ICD-10-CM | POA: Diagnosis not present

## 2016-07-01 DIAGNOSIS — E1121 Type 2 diabetes mellitus with diabetic nephropathy: Secondary | ICD-10-CM | POA: Diagnosis not present

## 2016-07-01 DIAGNOSIS — D509 Iron deficiency anemia, unspecified: Secondary | ICD-10-CM | POA: Diagnosis not present

## 2016-07-01 DIAGNOSIS — D631 Anemia in chronic kidney disease: Secondary | ICD-10-CM | POA: Diagnosis not present

## 2016-07-03 DIAGNOSIS — E1121 Type 2 diabetes mellitus with diabetic nephropathy: Secondary | ICD-10-CM | POA: Diagnosis not present

## 2016-07-03 DIAGNOSIS — Z23 Encounter for immunization: Secondary | ICD-10-CM | POA: Diagnosis not present

## 2016-07-03 DIAGNOSIS — N2581 Secondary hyperparathyroidism of renal origin: Secondary | ICD-10-CM | POA: Diagnosis not present

## 2016-07-03 DIAGNOSIS — D509 Iron deficiency anemia, unspecified: Secondary | ICD-10-CM | POA: Diagnosis not present

## 2016-07-03 DIAGNOSIS — D631 Anemia in chronic kidney disease: Secondary | ICD-10-CM | POA: Diagnosis not present

## 2016-07-03 DIAGNOSIS — N186 End stage renal disease: Secondary | ICD-10-CM | POA: Diagnosis not present

## 2016-07-05 DIAGNOSIS — E1121 Type 2 diabetes mellitus with diabetic nephropathy: Secondary | ICD-10-CM | POA: Diagnosis not present

## 2016-07-05 DIAGNOSIS — N2581 Secondary hyperparathyroidism of renal origin: Secondary | ICD-10-CM | POA: Diagnosis not present

## 2016-07-05 DIAGNOSIS — Z23 Encounter for immunization: Secondary | ICD-10-CM | POA: Diagnosis not present

## 2016-07-05 DIAGNOSIS — N186 End stage renal disease: Secondary | ICD-10-CM | POA: Diagnosis not present

## 2016-07-05 DIAGNOSIS — D509 Iron deficiency anemia, unspecified: Secondary | ICD-10-CM | POA: Diagnosis not present

## 2016-07-05 DIAGNOSIS — D631 Anemia in chronic kidney disease: Secondary | ICD-10-CM | POA: Diagnosis not present

## 2016-07-08 DIAGNOSIS — N2581 Secondary hyperparathyroidism of renal origin: Secondary | ICD-10-CM | POA: Diagnosis not present

## 2016-07-08 DIAGNOSIS — Z23 Encounter for immunization: Secondary | ICD-10-CM | POA: Diagnosis not present

## 2016-07-08 DIAGNOSIS — D631 Anemia in chronic kidney disease: Secondary | ICD-10-CM | POA: Diagnosis not present

## 2016-07-08 DIAGNOSIS — D509 Iron deficiency anemia, unspecified: Secondary | ICD-10-CM | POA: Diagnosis not present

## 2016-07-08 DIAGNOSIS — E1121 Type 2 diabetes mellitus with diabetic nephropathy: Secondary | ICD-10-CM | POA: Diagnosis not present

## 2016-07-08 DIAGNOSIS — N186 End stage renal disease: Secondary | ICD-10-CM | POA: Diagnosis not present

## 2016-07-10 DIAGNOSIS — N2581 Secondary hyperparathyroidism of renal origin: Secondary | ICD-10-CM | POA: Diagnosis not present

## 2016-07-10 DIAGNOSIS — Z23 Encounter for immunization: Secondary | ICD-10-CM | POA: Diagnosis not present

## 2016-07-10 DIAGNOSIS — D509 Iron deficiency anemia, unspecified: Secondary | ICD-10-CM | POA: Diagnosis not present

## 2016-07-10 DIAGNOSIS — E1121 Type 2 diabetes mellitus with diabetic nephropathy: Secondary | ICD-10-CM | POA: Diagnosis not present

## 2016-07-10 DIAGNOSIS — D631 Anemia in chronic kidney disease: Secondary | ICD-10-CM | POA: Diagnosis not present

## 2016-07-10 DIAGNOSIS — N186 End stage renal disease: Secondary | ICD-10-CM | POA: Diagnosis not present

## 2016-07-12 DIAGNOSIS — D509 Iron deficiency anemia, unspecified: Secondary | ICD-10-CM | POA: Diagnosis not present

## 2016-07-12 DIAGNOSIS — Z23 Encounter for immunization: Secondary | ICD-10-CM | POA: Diagnosis not present

## 2016-07-12 DIAGNOSIS — E1121 Type 2 diabetes mellitus with diabetic nephropathy: Secondary | ICD-10-CM | POA: Diagnosis not present

## 2016-07-12 DIAGNOSIS — N186 End stage renal disease: Secondary | ICD-10-CM | POA: Diagnosis not present

## 2016-07-12 DIAGNOSIS — D631 Anemia in chronic kidney disease: Secondary | ICD-10-CM | POA: Diagnosis not present

## 2016-07-12 DIAGNOSIS — N2581 Secondary hyperparathyroidism of renal origin: Secondary | ICD-10-CM | POA: Diagnosis not present

## 2016-07-15 DIAGNOSIS — N2581 Secondary hyperparathyroidism of renal origin: Secondary | ICD-10-CM | POA: Diagnosis not present

## 2016-07-15 DIAGNOSIS — Z23 Encounter for immunization: Secondary | ICD-10-CM | POA: Diagnosis not present

## 2016-07-15 DIAGNOSIS — D631 Anemia in chronic kidney disease: Secondary | ICD-10-CM | POA: Diagnosis not present

## 2016-07-15 DIAGNOSIS — N186 End stage renal disease: Secondary | ICD-10-CM | POA: Diagnosis not present

## 2016-07-15 DIAGNOSIS — D509 Iron deficiency anemia, unspecified: Secondary | ICD-10-CM | POA: Diagnosis not present

## 2016-07-15 DIAGNOSIS — E1121 Type 2 diabetes mellitus with diabetic nephropathy: Secondary | ICD-10-CM | POA: Diagnosis not present

## 2016-07-16 DIAGNOSIS — I10 Essential (primary) hypertension: Secondary | ICD-10-CM | POA: Diagnosis not present

## 2016-07-16 DIAGNOSIS — M1A3720 Chronic gout due to renal impairment, left ankle and foot, without tophus (tophi): Secondary | ICD-10-CM | POA: Diagnosis not present

## 2016-07-16 DIAGNOSIS — E1140 Type 2 diabetes mellitus with diabetic neuropathy, unspecified: Secondary | ICD-10-CM | POA: Diagnosis not present

## 2016-07-16 DIAGNOSIS — N186 End stage renal disease: Secondary | ICD-10-CM | POA: Diagnosis not present

## 2016-07-17 DIAGNOSIS — D509 Iron deficiency anemia, unspecified: Secondary | ICD-10-CM | POA: Diagnosis not present

## 2016-07-17 DIAGNOSIS — Z23 Encounter for immunization: Secondary | ICD-10-CM | POA: Diagnosis not present

## 2016-07-17 DIAGNOSIS — N186 End stage renal disease: Secondary | ICD-10-CM | POA: Diagnosis not present

## 2016-07-17 DIAGNOSIS — E1122 Type 2 diabetes mellitus with diabetic chronic kidney disease: Secondary | ICD-10-CM | POA: Diagnosis not present

## 2016-07-17 DIAGNOSIS — E1121 Type 2 diabetes mellitus with diabetic nephropathy: Secondary | ICD-10-CM | POA: Diagnosis not present

## 2016-07-17 DIAGNOSIS — D631 Anemia in chronic kidney disease: Secondary | ICD-10-CM | POA: Diagnosis not present

## 2016-07-17 DIAGNOSIS — Z992 Dependence on renal dialysis: Secondary | ICD-10-CM | POA: Diagnosis not present

## 2016-07-17 DIAGNOSIS — N2581 Secondary hyperparathyroidism of renal origin: Secondary | ICD-10-CM | POA: Diagnosis not present

## 2016-07-19 DIAGNOSIS — N186 End stage renal disease: Secondary | ICD-10-CM | POA: Diagnosis not present

## 2016-07-19 DIAGNOSIS — N2581 Secondary hyperparathyroidism of renal origin: Secondary | ICD-10-CM | POA: Diagnosis not present

## 2016-07-19 DIAGNOSIS — D509 Iron deficiency anemia, unspecified: Secondary | ICD-10-CM | POA: Diagnosis not present

## 2016-07-19 DIAGNOSIS — E1121 Type 2 diabetes mellitus with diabetic nephropathy: Secondary | ICD-10-CM | POA: Diagnosis not present

## 2016-07-19 DIAGNOSIS — D631 Anemia in chronic kidney disease: Secondary | ICD-10-CM | POA: Diagnosis not present

## 2016-07-22 DIAGNOSIS — N186 End stage renal disease: Secondary | ICD-10-CM | POA: Diagnosis not present

## 2016-07-22 DIAGNOSIS — N2581 Secondary hyperparathyroidism of renal origin: Secondary | ICD-10-CM | POA: Diagnosis not present

## 2016-07-22 DIAGNOSIS — D509 Iron deficiency anemia, unspecified: Secondary | ICD-10-CM | POA: Diagnosis not present

## 2016-07-22 DIAGNOSIS — D631 Anemia in chronic kidney disease: Secondary | ICD-10-CM | POA: Diagnosis not present

## 2016-07-22 DIAGNOSIS — E1121 Type 2 diabetes mellitus with diabetic nephropathy: Secondary | ICD-10-CM | POA: Diagnosis not present

## 2016-07-24 DIAGNOSIS — N186 End stage renal disease: Secondary | ICD-10-CM | POA: Diagnosis not present

## 2016-07-24 DIAGNOSIS — E1121 Type 2 diabetes mellitus with diabetic nephropathy: Secondary | ICD-10-CM | POA: Diagnosis not present

## 2016-07-24 DIAGNOSIS — N2581 Secondary hyperparathyroidism of renal origin: Secondary | ICD-10-CM | POA: Diagnosis not present

## 2016-07-24 DIAGNOSIS — D631 Anemia in chronic kidney disease: Secondary | ICD-10-CM | POA: Diagnosis not present

## 2016-07-24 DIAGNOSIS — D509 Iron deficiency anemia, unspecified: Secondary | ICD-10-CM | POA: Diagnosis not present

## 2016-07-26 DIAGNOSIS — N2581 Secondary hyperparathyroidism of renal origin: Secondary | ICD-10-CM | POA: Diagnosis not present

## 2016-07-26 DIAGNOSIS — N186 End stage renal disease: Secondary | ICD-10-CM | POA: Diagnosis not present

## 2016-07-26 DIAGNOSIS — E1121 Type 2 diabetes mellitus with diabetic nephropathy: Secondary | ICD-10-CM | POA: Diagnosis not present

## 2016-07-26 DIAGNOSIS — D631 Anemia in chronic kidney disease: Secondary | ICD-10-CM | POA: Diagnosis not present

## 2016-07-26 DIAGNOSIS — D509 Iron deficiency anemia, unspecified: Secondary | ICD-10-CM | POA: Diagnosis not present

## 2016-07-29 DIAGNOSIS — E1121 Type 2 diabetes mellitus with diabetic nephropathy: Secondary | ICD-10-CM | POA: Diagnosis not present

## 2016-07-29 DIAGNOSIS — D631 Anemia in chronic kidney disease: Secondary | ICD-10-CM | POA: Diagnosis not present

## 2016-07-29 DIAGNOSIS — D509 Iron deficiency anemia, unspecified: Secondary | ICD-10-CM | POA: Diagnosis not present

## 2016-07-29 DIAGNOSIS — N2581 Secondary hyperparathyroidism of renal origin: Secondary | ICD-10-CM | POA: Diagnosis not present

## 2016-07-29 DIAGNOSIS — N186 End stage renal disease: Secondary | ICD-10-CM | POA: Diagnosis not present

## 2016-07-31 DIAGNOSIS — N186 End stage renal disease: Secondary | ICD-10-CM | POA: Diagnosis not present

## 2016-07-31 DIAGNOSIS — D509 Iron deficiency anemia, unspecified: Secondary | ICD-10-CM | POA: Diagnosis not present

## 2016-07-31 DIAGNOSIS — D631 Anemia in chronic kidney disease: Secondary | ICD-10-CM | POA: Diagnosis not present

## 2016-07-31 DIAGNOSIS — N2581 Secondary hyperparathyroidism of renal origin: Secondary | ICD-10-CM | POA: Diagnosis not present

## 2016-07-31 DIAGNOSIS — E1121 Type 2 diabetes mellitus with diabetic nephropathy: Secondary | ICD-10-CM | POA: Diagnosis not present

## 2016-08-01 DIAGNOSIS — N186 End stage renal disease: Secondary | ICD-10-CM | POA: Diagnosis not present

## 2016-08-01 DIAGNOSIS — Z992 Dependence on renal dialysis: Secondary | ICD-10-CM | POA: Diagnosis not present

## 2016-08-01 DIAGNOSIS — T82858A Stenosis of vascular prosthetic devices, implants and grafts, initial encounter: Secondary | ICD-10-CM | POA: Diagnosis not present

## 2016-08-01 DIAGNOSIS — I871 Compression of vein: Secondary | ICD-10-CM | POA: Diagnosis not present

## 2016-08-02 DIAGNOSIS — D509 Iron deficiency anemia, unspecified: Secondary | ICD-10-CM | POA: Diagnosis not present

## 2016-08-02 DIAGNOSIS — D631 Anemia in chronic kidney disease: Secondary | ICD-10-CM | POA: Diagnosis not present

## 2016-08-02 DIAGNOSIS — N2581 Secondary hyperparathyroidism of renal origin: Secondary | ICD-10-CM | POA: Diagnosis not present

## 2016-08-02 DIAGNOSIS — E1121 Type 2 diabetes mellitus with diabetic nephropathy: Secondary | ICD-10-CM | POA: Diagnosis not present

## 2016-08-02 DIAGNOSIS — N186 End stage renal disease: Secondary | ICD-10-CM | POA: Diagnosis not present

## 2016-08-05 DIAGNOSIS — N186 End stage renal disease: Secondary | ICD-10-CM | POA: Diagnosis not present

## 2016-08-05 DIAGNOSIS — D631 Anemia in chronic kidney disease: Secondary | ICD-10-CM | POA: Diagnosis not present

## 2016-08-05 DIAGNOSIS — N2581 Secondary hyperparathyroidism of renal origin: Secondary | ICD-10-CM | POA: Diagnosis not present

## 2016-08-05 DIAGNOSIS — E1121 Type 2 diabetes mellitus with diabetic nephropathy: Secondary | ICD-10-CM | POA: Diagnosis not present

## 2016-08-05 DIAGNOSIS — D509 Iron deficiency anemia, unspecified: Secondary | ICD-10-CM | POA: Diagnosis not present

## 2016-08-07 DIAGNOSIS — N186 End stage renal disease: Secondary | ICD-10-CM | POA: Diagnosis not present

## 2016-08-07 DIAGNOSIS — D509 Iron deficiency anemia, unspecified: Secondary | ICD-10-CM | POA: Diagnosis not present

## 2016-08-07 DIAGNOSIS — N2581 Secondary hyperparathyroidism of renal origin: Secondary | ICD-10-CM | POA: Diagnosis not present

## 2016-08-07 DIAGNOSIS — E1121 Type 2 diabetes mellitus with diabetic nephropathy: Secondary | ICD-10-CM | POA: Diagnosis not present

## 2016-08-07 DIAGNOSIS — D631 Anemia in chronic kidney disease: Secondary | ICD-10-CM | POA: Diagnosis not present

## 2016-08-09 DIAGNOSIS — D631 Anemia in chronic kidney disease: Secondary | ICD-10-CM | POA: Diagnosis not present

## 2016-08-09 DIAGNOSIS — N186 End stage renal disease: Secondary | ICD-10-CM | POA: Diagnosis not present

## 2016-08-09 DIAGNOSIS — N2581 Secondary hyperparathyroidism of renal origin: Secondary | ICD-10-CM | POA: Diagnosis not present

## 2016-08-09 DIAGNOSIS — E1121 Type 2 diabetes mellitus with diabetic nephropathy: Secondary | ICD-10-CM | POA: Diagnosis not present

## 2016-08-09 DIAGNOSIS — D509 Iron deficiency anemia, unspecified: Secondary | ICD-10-CM | POA: Diagnosis not present

## 2016-08-12 DIAGNOSIS — D631 Anemia in chronic kidney disease: Secondary | ICD-10-CM | POA: Diagnosis not present

## 2016-08-12 DIAGNOSIS — E1121 Type 2 diabetes mellitus with diabetic nephropathy: Secondary | ICD-10-CM | POA: Diagnosis not present

## 2016-08-12 DIAGNOSIS — D509 Iron deficiency anemia, unspecified: Secondary | ICD-10-CM | POA: Diagnosis not present

## 2016-08-12 DIAGNOSIS — N186 End stage renal disease: Secondary | ICD-10-CM | POA: Diagnosis not present

## 2016-08-12 DIAGNOSIS — N2581 Secondary hyperparathyroidism of renal origin: Secondary | ICD-10-CM | POA: Diagnosis not present

## 2016-08-14 DIAGNOSIS — D509 Iron deficiency anemia, unspecified: Secondary | ICD-10-CM | POA: Diagnosis not present

## 2016-08-14 DIAGNOSIS — N186 End stage renal disease: Secondary | ICD-10-CM | POA: Diagnosis not present

## 2016-08-14 DIAGNOSIS — Z992 Dependence on renal dialysis: Secondary | ICD-10-CM | POA: Diagnosis not present

## 2016-08-14 DIAGNOSIS — E1121 Type 2 diabetes mellitus with diabetic nephropathy: Secondary | ICD-10-CM | POA: Diagnosis not present

## 2016-08-14 DIAGNOSIS — D631 Anemia in chronic kidney disease: Secondary | ICD-10-CM | POA: Diagnosis not present

## 2016-08-14 DIAGNOSIS — N2581 Secondary hyperparathyroidism of renal origin: Secondary | ICD-10-CM | POA: Diagnosis not present

## 2016-08-14 DIAGNOSIS — E1122 Type 2 diabetes mellitus with diabetic chronic kidney disease: Secondary | ICD-10-CM | POA: Diagnosis not present

## 2016-08-16 DIAGNOSIS — E1121 Type 2 diabetes mellitus with diabetic nephropathy: Secondary | ICD-10-CM | POA: Diagnosis not present

## 2016-08-16 DIAGNOSIS — N186 End stage renal disease: Secondary | ICD-10-CM | POA: Diagnosis not present

## 2016-08-16 DIAGNOSIS — D631 Anemia in chronic kidney disease: Secondary | ICD-10-CM | POA: Diagnosis not present

## 2016-08-16 DIAGNOSIS — N2581 Secondary hyperparathyroidism of renal origin: Secondary | ICD-10-CM | POA: Diagnosis not present

## 2016-08-19 DIAGNOSIS — N2581 Secondary hyperparathyroidism of renal origin: Secondary | ICD-10-CM | POA: Diagnosis not present

## 2016-08-19 DIAGNOSIS — E1121 Type 2 diabetes mellitus with diabetic nephropathy: Secondary | ICD-10-CM | POA: Diagnosis not present

## 2016-08-19 DIAGNOSIS — D631 Anemia in chronic kidney disease: Secondary | ICD-10-CM | POA: Diagnosis not present

## 2016-08-19 DIAGNOSIS — N186 End stage renal disease: Secondary | ICD-10-CM | POA: Diagnosis not present

## 2016-08-20 DIAGNOSIS — N186 End stage renal disease: Secondary | ICD-10-CM | POA: Diagnosis not present

## 2016-08-20 DIAGNOSIS — N2581 Secondary hyperparathyroidism of renal origin: Secondary | ICD-10-CM | POA: Diagnosis not present

## 2016-08-20 DIAGNOSIS — E8770 Fluid overload, unspecified: Secondary | ICD-10-CM | POA: Diagnosis not present

## 2016-08-21 DIAGNOSIS — N186 End stage renal disease: Secondary | ICD-10-CM | POA: Diagnosis not present

## 2016-08-21 DIAGNOSIS — D631 Anemia in chronic kidney disease: Secondary | ICD-10-CM | POA: Diagnosis not present

## 2016-08-21 DIAGNOSIS — E1121 Type 2 diabetes mellitus with diabetic nephropathy: Secondary | ICD-10-CM | POA: Diagnosis not present

## 2016-08-21 DIAGNOSIS — N2581 Secondary hyperparathyroidism of renal origin: Secondary | ICD-10-CM | POA: Diagnosis not present

## 2016-08-23 DIAGNOSIS — D631 Anemia in chronic kidney disease: Secondary | ICD-10-CM | POA: Diagnosis not present

## 2016-08-23 DIAGNOSIS — N2581 Secondary hyperparathyroidism of renal origin: Secondary | ICD-10-CM | POA: Diagnosis not present

## 2016-08-23 DIAGNOSIS — E1121 Type 2 diabetes mellitus with diabetic nephropathy: Secondary | ICD-10-CM | POA: Diagnosis not present

## 2016-08-23 DIAGNOSIS — N186 End stage renal disease: Secondary | ICD-10-CM | POA: Diagnosis not present

## 2016-08-26 DIAGNOSIS — D631 Anemia in chronic kidney disease: Secondary | ICD-10-CM | POA: Diagnosis not present

## 2016-08-26 DIAGNOSIS — E1121 Type 2 diabetes mellitus with diabetic nephropathy: Secondary | ICD-10-CM | POA: Diagnosis not present

## 2016-08-26 DIAGNOSIS — N2581 Secondary hyperparathyroidism of renal origin: Secondary | ICD-10-CM | POA: Diagnosis not present

## 2016-08-26 DIAGNOSIS — N186 End stage renal disease: Secondary | ICD-10-CM | POA: Diagnosis not present

## 2016-08-28 DIAGNOSIS — E1121 Type 2 diabetes mellitus with diabetic nephropathy: Secondary | ICD-10-CM | POA: Diagnosis not present

## 2016-08-28 DIAGNOSIS — N186 End stage renal disease: Secondary | ICD-10-CM | POA: Diagnosis not present

## 2016-08-28 DIAGNOSIS — D631 Anemia in chronic kidney disease: Secondary | ICD-10-CM | POA: Diagnosis not present

## 2016-08-28 DIAGNOSIS — N2581 Secondary hyperparathyroidism of renal origin: Secondary | ICD-10-CM | POA: Diagnosis not present

## 2016-08-30 DIAGNOSIS — E1121 Type 2 diabetes mellitus with diabetic nephropathy: Secondary | ICD-10-CM | POA: Diagnosis not present

## 2016-08-30 DIAGNOSIS — D631 Anemia in chronic kidney disease: Secondary | ICD-10-CM | POA: Diagnosis not present

## 2016-08-30 DIAGNOSIS — N2581 Secondary hyperparathyroidism of renal origin: Secondary | ICD-10-CM | POA: Diagnosis not present

## 2016-08-30 DIAGNOSIS — N186 End stage renal disease: Secondary | ICD-10-CM | POA: Diagnosis not present

## 2016-09-02 DIAGNOSIS — N186 End stage renal disease: Secondary | ICD-10-CM | POA: Diagnosis not present

## 2016-09-02 DIAGNOSIS — D631 Anemia in chronic kidney disease: Secondary | ICD-10-CM | POA: Diagnosis not present

## 2016-09-02 DIAGNOSIS — E1121 Type 2 diabetes mellitus with diabetic nephropathy: Secondary | ICD-10-CM | POA: Diagnosis not present

## 2016-09-02 DIAGNOSIS — N2581 Secondary hyperparathyroidism of renal origin: Secondary | ICD-10-CM | POA: Diagnosis not present

## 2016-09-04 DIAGNOSIS — N186 End stage renal disease: Secondary | ICD-10-CM | POA: Diagnosis not present

## 2016-09-04 DIAGNOSIS — D631 Anemia in chronic kidney disease: Secondary | ICD-10-CM | POA: Diagnosis not present

## 2016-09-04 DIAGNOSIS — N2581 Secondary hyperparathyroidism of renal origin: Secondary | ICD-10-CM | POA: Diagnosis not present

## 2016-09-04 DIAGNOSIS — E1121 Type 2 diabetes mellitus with diabetic nephropathy: Secondary | ICD-10-CM | POA: Diagnosis not present

## 2016-09-06 DIAGNOSIS — N186 End stage renal disease: Secondary | ICD-10-CM | POA: Diagnosis not present

## 2016-09-06 DIAGNOSIS — N2581 Secondary hyperparathyroidism of renal origin: Secondary | ICD-10-CM | POA: Diagnosis not present

## 2016-09-06 DIAGNOSIS — D631 Anemia in chronic kidney disease: Secondary | ICD-10-CM | POA: Diagnosis not present

## 2016-09-06 DIAGNOSIS — E1121 Type 2 diabetes mellitus with diabetic nephropathy: Secondary | ICD-10-CM | POA: Diagnosis not present

## 2016-09-09 DIAGNOSIS — D631 Anemia in chronic kidney disease: Secondary | ICD-10-CM | POA: Diagnosis not present

## 2016-09-09 DIAGNOSIS — N2581 Secondary hyperparathyroidism of renal origin: Secondary | ICD-10-CM | POA: Diagnosis not present

## 2016-09-09 DIAGNOSIS — E1121 Type 2 diabetes mellitus with diabetic nephropathy: Secondary | ICD-10-CM | POA: Diagnosis not present

## 2016-09-09 DIAGNOSIS — N186 End stage renal disease: Secondary | ICD-10-CM | POA: Diagnosis not present

## 2016-09-11 DIAGNOSIS — D631 Anemia in chronic kidney disease: Secondary | ICD-10-CM | POA: Diagnosis not present

## 2016-09-11 DIAGNOSIS — N186 End stage renal disease: Secondary | ICD-10-CM | POA: Diagnosis not present

## 2016-09-11 DIAGNOSIS — N2581 Secondary hyperparathyroidism of renal origin: Secondary | ICD-10-CM | POA: Diagnosis not present

## 2016-09-11 DIAGNOSIS — E1121 Type 2 diabetes mellitus with diabetic nephropathy: Secondary | ICD-10-CM | POA: Diagnosis not present

## 2016-09-13 DIAGNOSIS — N186 End stage renal disease: Secondary | ICD-10-CM | POA: Diagnosis not present

## 2016-09-13 DIAGNOSIS — E1121 Type 2 diabetes mellitus with diabetic nephropathy: Secondary | ICD-10-CM | POA: Diagnosis not present

## 2016-09-13 DIAGNOSIS — N2581 Secondary hyperparathyroidism of renal origin: Secondary | ICD-10-CM | POA: Diagnosis not present

## 2016-09-13 DIAGNOSIS — D631 Anemia in chronic kidney disease: Secondary | ICD-10-CM | POA: Diagnosis not present

## 2016-09-14 DIAGNOSIS — E1122 Type 2 diabetes mellitus with diabetic chronic kidney disease: Secondary | ICD-10-CM | POA: Diagnosis not present

## 2016-09-14 DIAGNOSIS — Z992 Dependence on renal dialysis: Secondary | ICD-10-CM | POA: Diagnosis not present

## 2016-09-14 DIAGNOSIS — N186 End stage renal disease: Secondary | ICD-10-CM | POA: Diagnosis not present

## 2016-09-16 DIAGNOSIS — E1121 Type 2 diabetes mellitus with diabetic nephropathy: Secondary | ICD-10-CM | POA: Diagnosis not present

## 2016-09-16 DIAGNOSIS — D631 Anemia in chronic kidney disease: Secondary | ICD-10-CM | POA: Diagnosis not present

## 2016-09-16 DIAGNOSIS — N186 End stage renal disease: Secondary | ICD-10-CM | POA: Diagnosis not present

## 2016-09-16 DIAGNOSIS — D509 Iron deficiency anemia, unspecified: Secondary | ICD-10-CM | POA: Diagnosis not present

## 2016-09-16 DIAGNOSIS — N2581 Secondary hyperparathyroidism of renal origin: Secondary | ICD-10-CM | POA: Diagnosis not present

## 2016-09-18 DIAGNOSIS — N186 End stage renal disease: Secondary | ICD-10-CM | POA: Diagnosis not present

## 2016-09-18 DIAGNOSIS — N2581 Secondary hyperparathyroidism of renal origin: Secondary | ICD-10-CM | POA: Diagnosis not present

## 2016-09-18 DIAGNOSIS — D631 Anemia in chronic kidney disease: Secondary | ICD-10-CM | POA: Diagnosis not present

## 2016-09-18 DIAGNOSIS — E1121 Type 2 diabetes mellitus with diabetic nephropathy: Secondary | ICD-10-CM | POA: Diagnosis not present

## 2016-09-18 DIAGNOSIS — D509 Iron deficiency anemia, unspecified: Secondary | ICD-10-CM | POA: Diagnosis not present

## 2016-09-20 DIAGNOSIS — N2581 Secondary hyperparathyroidism of renal origin: Secondary | ICD-10-CM | POA: Diagnosis not present

## 2016-09-20 DIAGNOSIS — D631 Anemia in chronic kidney disease: Secondary | ICD-10-CM | POA: Diagnosis not present

## 2016-09-20 DIAGNOSIS — N186 End stage renal disease: Secondary | ICD-10-CM | POA: Diagnosis not present

## 2016-09-20 DIAGNOSIS — D509 Iron deficiency anemia, unspecified: Secondary | ICD-10-CM | POA: Diagnosis not present

## 2016-09-20 DIAGNOSIS — E1121 Type 2 diabetes mellitus with diabetic nephropathy: Secondary | ICD-10-CM | POA: Diagnosis not present

## 2016-09-23 DIAGNOSIS — E1121 Type 2 diabetes mellitus with diabetic nephropathy: Secondary | ICD-10-CM | POA: Diagnosis not present

## 2016-09-23 DIAGNOSIS — N2581 Secondary hyperparathyroidism of renal origin: Secondary | ICD-10-CM | POA: Diagnosis not present

## 2016-09-23 DIAGNOSIS — D509 Iron deficiency anemia, unspecified: Secondary | ICD-10-CM | POA: Diagnosis not present

## 2016-09-23 DIAGNOSIS — D631 Anemia in chronic kidney disease: Secondary | ICD-10-CM | POA: Diagnosis not present

## 2016-09-23 DIAGNOSIS — N186 End stage renal disease: Secondary | ICD-10-CM | POA: Diagnosis not present

## 2016-09-25 DIAGNOSIS — N2581 Secondary hyperparathyroidism of renal origin: Secondary | ICD-10-CM | POA: Diagnosis not present

## 2016-09-25 DIAGNOSIS — N186 End stage renal disease: Secondary | ICD-10-CM | POA: Diagnosis not present

## 2016-09-25 DIAGNOSIS — D631 Anemia in chronic kidney disease: Secondary | ICD-10-CM | POA: Diagnosis not present

## 2016-09-25 DIAGNOSIS — E1121 Type 2 diabetes mellitus with diabetic nephropathy: Secondary | ICD-10-CM | POA: Diagnosis not present

## 2016-09-25 DIAGNOSIS — D509 Iron deficiency anemia, unspecified: Secondary | ICD-10-CM | POA: Diagnosis not present

## 2016-09-27 DIAGNOSIS — N186 End stage renal disease: Secondary | ICD-10-CM | POA: Diagnosis not present

## 2016-09-27 DIAGNOSIS — D509 Iron deficiency anemia, unspecified: Secondary | ICD-10-CM | POA: Diagnosis not present

## 2016-09-27 DIAGNOSIS — N2581 Secondary hyperparathyroidism of renal origin: Secondary | ICD-10-CM | POA: Diagnosis not present

## 2016-09-27 DIAGNOSIS — D631 Anemia in chronic kidney disease: Secondary | ICD-10-CM | POA: Diagnosis not present

## 2016-09-27 DIAGNOSIS — E1121 Type 2 diabetes mellitus with diabetic nephropathy: Secondary | ICD-10-CM | POA: Diagnosis not present

## 2016-09-28 ENCOUNTER — Other Ambulatory Visit: Payer: Self-pay | Admitting: Internal Medicine

## 2016-09-30 DIAGNOSIS — N186 End stage renal disease: Secondary | ICD-10-CM | POA: Diagnosis not present

## 2016-09-30 DIAGNOSIS — D509 Iron deficiency anemia, unspecified: Secondary | ICD-10-CM | POA: Diagnosis not present

## 2016-09-30 DIAGNOSIS — D631 Anemia in chronic kidney disease: Secondary | ICD-10-CM | POA: Diagnosis not present

## 2016-09-30 DIAGNOSIS — E1121 Type 2 diabetes mellitus with diabetic nephropathy: Secondary | ICD-10-CM | POA: Diagnosis not present

## 2016-09-30 DIAGNOSIS — N2581 Secondary hyperparathyroidism of renal origin: Secondary | ICD-10-CM | POA: Diagnosis not present

## 2016-10-02 DIAGNOSIS — D631 Anemia in chronic kidney disease: Secondary | ICD-10-CM | POA: Diagnosis not present

## 2016-10-02 DIAGNOSIS — N2581 Secondary hyperparathyroidism of renal origin: Secondary | ICD-10-CM | POA: Diagnosis not present

## 2016-10-02 DIAGNOSIS — N186 End stage renal disease: Secondary | ICD-10-CM | POA: Diagnosis not present

## 2016-10-02 DIAGNOSIS — D509 Iron deficiency anemia, unspecified: Secondary | ICD-10-CM | POA: Diagnosis not present

## 2016-10-02 DIAGNOSIS — E1121 Type 2 diabetes mellitus with diabetic nephropathy: Secondary | ICD-10-CM | POA: Diagnosis not present

## 2016-10-04 DIAGNOSIS — D509 Iron deficiency anemia, unspecified: Secondary | ICD-10-CM | POA: Diagnosis not present

## 2016-10-04 DIAGNOSIS — D631 Anemia in chronic kidney disease: Secondary | ICD-10-CM | POA: Diagnosis not present

## 2016-10-04 DIAGNOSIS — N2581 Secondary hyperparathyroidism of renal origin: Secondary | ICD-10-CM | POA: Diagnosis not present

## 2016-10-04 DIAGNOSIS — N186 End stage renal disease: Secondary | ICD-10-CM | POA: Diagnosis not present

## 2016-10-04 DIAGNOSIS — E1121 Type 2 diabetes mellitus with diabetic nephropathy: Secondary | ICD-10-CM | POA: Diagnosis not present

## 2016-10-07 DIAGNOSIS — D631 Anemia in chronic kidney disease: Secondary | ICD-10-CM | POA: Diagnosis not present

## 2016-10-07 DIAGNOSIS — E1121 Type 2 diabetes mellitus with diabetic nephropathy: Secondary | ICD-10-CM | POA: Diagnosis not present

## 2016-10-07 DIAGNOSIS — N2581 Secondary hyperparathyroidism of renal origin: Secondary | ICD-10-CM | POA: Diagnosis not present

## 2016-10-07 DIAGNOSIS — D509 Iron deficiency anemia, unspecified: Secondary | ICD-10-CM | POA: Diagnosis not present

## 2016-10-07 DIAGNOSIS — N186 End stage renal disease: Secondary | ICD-10-CM | POA: Diagnosis not present

## 2016-10-09 DIAGNOSIS — D631 Anemia in chronic kidney disease: Secondary | ICD-10-CM | POA: Diagnosis not present

## 2016-10-09 DIAGNOSIS — D509 Iron deficiency anemia, unspecified: Secondary | ICD-10-CM | POA: Diagnosis not present

## 2016-10-09 DIAGNOSIS — N2581 Secondary hyperparathyroidism of renal origin: Secondary | ICD-10-CM | POA: Diagnosis not present

## 2016-10-09 DIAGNOSIS — N186 End stage renal disease: Secondary | ICD-10-CM | POA: Diagnosis not present

## 2016-10-09 DIAGNOSIS — E1121 Type 2 diabetes mellitus with diabetic nephropathy: Secondary | ICD-10-CM | POA: Diagnosis not present

## 2016-10-11 DIAGNOSIS — N2581 Secondary hyperparathyroidism of renal origin: Secondary | ICD-10-CM | POA: Diagnosis not present

## 2016-10-11 DIAGNOSIS — E1121 Type 2 diabetes mellitus with diabetic nephropathy: Secondary | ICD-10-CM | POA: Diagnosis not present

## 2016-10-11 DIAGNOSIS — D631 Anemia in chronic kidney disease: Secondary | ICD-10-CM | POA: Diagnosis not present

## 2016-10-11 DIAGNOSIS — N186 End stage renal disease: Secondary | ICD-10-CM | POA: Diagnosis not present

## 2016-10-11 DIAGNOSIS — D509 Iron deficiency anemia, unspecified: Secondary | ICD-10-CM | POA: Diagnosis not present

## 2016-10-14 DIAGNOSIS — D509 Iron deficiency anemia, unspecified: Secondary | ICD-10-CM | POA: Diagnosis not present

## 2016-10-14 DIAGNOSIS — N2581 Secondary hyperparathyroidism of renal origin: Secondary | ICD-10-CM | POA: Diagnosis not present

## 2016-10-14 DIAGNOSIS — E1121 Type 2 diabetes mellitus with diabetic nephropathy: Secondary | ICD-10-CM | POA: Diagnosis not present

## 2016-10-14 DIAGNOSIS — E1122 Type 2 diabetes mellitus with diabetic chronic kidney disease: Secondary | ICD-10-CM | POA: Diagnosis not present

## 2016-10-14 DIAGNOSIS — Z992 Dependence on renal dialysis: Secondary | ICD-10-CM | POA: Diagnosis not present

## 2016-10-14 DIAGNOSIS — D631 Anemia in chronic kidney disease: Secondary | ICD-10-CM | POA: Diagnosis not present

## 2016-10-14 DIAGNOSIS — N186 End stage renal disease: Secondary | ICD-10-CM | POA: Diagnosis not present

## 2016-10-16 DIAGNOSIS — D631 Anemia in chronic kidney disease: Secondary | ICD-10-CM | POA: Diagnosis not present

## 2016-10-16 DIAGNOSIS — D509 Iron deficiency anemia, unspecified: Secondary | ICD-10-CM | POA: Diagnosis not present

## 2016-10-16 DIAGNOSIS — N2581 Secondary hyperparathyroidism of renal origin: Secondary | ICD-10-CM | POA: Diagnosis not present

## 2016-10-16 DIAGNOSIS — N186 End stage renal disease: Secondary | ICD-10-CM | POA: Diagnosis not present

## 2016-10-16 DIAGNOSIS — E1121 Type 2 diabetes mellitus with diabetic nephropathy: Secondary | ICD-10-CM | POA: Diagnosis not present

## 2016-10-18 DIAGNOSIS — E1121 Type 2 diabetes mellitus with diabetic nephropathy: Secondary | ICD-10-CM | POA: Diagnosis not present

## 2016-10-18 DIAGNOSIS — N2581 Secondary hyperparathyroidism of renal origin: Secondary | ICD-10-CM | POA: Diagnosis not present

## 2016-10-18 DIAGNOSIS — D509 Iron deficiency anemia, unspecified: Secondary | ICD-10-CM | POA: Diagnosis not present

## 2016-10-18 DIAGNOSIS — N186 End stage renal disease: Secondary | ICD-10-CM | POA: Diagnosis not present

## 2016-10-18 DIAGNOSIS — D631 Anemia in chronic kidney disease: Secondary | ICD-10-CM | POA: Diagnosis not present

## 2016-10-21 DIAGNOSIS — N2581 Secondary hyperparathyroidism of renal origin: Secondary | ICD-10-CM | POA: Diagnosis not present

## 2016-10-21 DIAGNOSIS — E1121 Type 2 diabetes mellitus with diabetic nephropathy: Secondary | ICD-10-CM | POA: Diagnosis not present

## 2016-10-21 DIAGNOSIS — D509 Iron deficiency anemia, unspecified: Secondary | ICD-10-CM | POA: Diagnosis not present

## 2016-10-21 DIAGNOSIS — D631 Anemia in chronic kidney disease: Secondary | ICD-10-CM | POA: Diagnosis not present

## 2016-10-21 DIAGNOSIS — N186 End stage renal disease: Secondary | ICD-10-CM | POA: Diagnosis not present

## 2016-10-23 DIAGNOSIS — N186 End stage renal disease: Secondary | ICD-10-CM | POA: Diagnosis not present

## 2016-10-23 DIAGNOSIS — D509 Iron deficiency anemia, unspecified: Secondary | ICD-10-CM | POA: Diagnosis not present

## 2016-10-23 DIAGNOSIS — E1121 Type 2 diabetes mellitus with diabetic nephropathy: Secondary | ICD-10-CM | POA: Diagnosis not present

## 2016-10-23 DIAGNOSIS — D631 Anemia in chronic kidney disease: Secondary | ICD-10-CM | POA: Diagnosis not present

## 2016-10-23 DIAGNOSIS — N2581 Secondary hyperparathyroidism of renal origin: Secondary | ICD-10-CM | POA: Diagnosis not present

## 2016-10-25 DIAGNOSIS — E1121 Type 2 diabetes mellitus with diabetic nephropathy: Secondary | ICD-10-CM | POA: Diagnosis not present

## 2016-10-25 DIAGNOSIS — N186 End stage renal disease: Secondary | ICD-10-CM | POA: Diagnosis not present

## 2016-10-25 DIAGNOSIS — D509 Iron deficiency anemia, unspecified: Secondary | ICD-10-CM | POA: Diagnosis not present

## 2016-10-25 DIAGNOSIS — D631 Anemia in chronic kidney disease: Secondary | ICD-10-CM | POA: Diagnosis not present

## 2016-10-25 DIAGNOSIS — N2581 Secondary hyperparathyroidism of renal origin: Secondary | ICD-10-CM | POA: Diagnosis not present

## 2016-10-28 DIAGNOSIS — D509 Iron deficiency anemia, unspecified: Secondary | ICD-10-CM | POA: Diagnosis not present

## 2016-10-28 DIAGNOSIS — E1121 Type 2 diabetes mellitus with diabetic nephropathy: Secondary | ICD-10-CM | POA: Diagnosis not present

## 2016-10-28 DIAGNOSIS — N186 End stage renal disease: Secondary | ICD-10-CM | POA: Diagnosis not present

## 2016-10-28 DIAGNOSIS — N2581 Secondary hyperparathyroidism of renal origin: Secondary | ICD-10-CM | POA: Diagnosis not present

## 2016-10-28 DIAGNOSIS — D631 Anemia in chronic kidney disease: Secondary | ICD-10-CM | POA: Diagnosis not present

## 2016-10-30 DIAGNOSIS — N186 End stage renal disease: Secondary | ICD-10-CM | POA: Diagnosis not present

## 2016-10-30 DIAGNOSIS — E1121 Type 2 diabetes mellitus with diabetic nephropathy: Secondary | ICD-10-CM | POA: Diagnosis not present

## 2016-10-30 DIAGNOSIS — N2581 Secondary hyperparathyroidism of renal origin: Secondary | ICD-10-CM | POA: Diagnosis not present

## 2016-10-30 DIAGNOSIS — D509 Iron deficiency anemia, unspecified: Secondary | ICD-10-CM | POA: Diagnosis not present

## 2016-10-30 DIAGNOSIS — D631 Anemia in chronic kidney disease: Secondary | ICD-10-CM | POA: Diagnosis not present

## 2016-11-01 DIAGNOSIS — N2581 Secondary hyperparathyroidism of renal origin: Secondary | ICD-10-CM | POA: Diagnosis not present

## 2016-11-01 DIAGNOSIS — D509 Iron deficiency anemia, unspecified: Secondary | ICD-10-CM | POA: Diagnosis not present

## 2016-11-01 DIAGNOSIS — N186 End stage renal disease: Secondary | ICD-10-CM | POA: Diagnosis not present

## 2016-11-01 DIAGNOSIS — D631 Anemia in chronic kidney disease: Secondary | ICD-10-CM | POA: Diagnosis not present

## 2016-11-01 DIAGNOSIS — E1121 Type 2 diabetes mellitus with diabetic nephropathy: Secondary | ICD-10-CM | POA: Diagnosis not present

## 2016-11-04 DIAGNOSIS — N186 End stage renal disease: Secondary | ICD-10-CM | POA: Diagnosis not present

## 2016-11-04 DIAGNOSIS — D509 Iron deficiency anemia, unspecified: Secondary | ICD-10-CM | POA: Diagnosis not present

## 2016-11-04 DIAGNOSIS — D631 Anemia in chronic kidney disease: Secondary | ICD-10-CM | POA: Diagnosis not present

## 2016-11-04 DIAGNOSIS — N2581 Secondary hyperparathyroidism of renal origin: Secondary | ICD-10-CM | POA: Diagnosis not present

## 2016-11-04 DIAGNOSIS — E1121 Type 2 diabetes mellitus with diabetic nephropathy: Secondary | ICD-10-CM | POA: Diagnosis not present

## 2016-11-06 DIAGNOSIS — D509 Iron deficiency anemia, unspecified: Secondary | ICD-10-CM | POA: Diagnosis not present

## 2016-11-06 DIAGNOSIS — D631 Anemia in chronic kidney disease: Secondary | ICD-10-CM | POA: Diagnosis not present

## 2016-11-06 DIAGNOSIS — N2581 Secondary hyperparathyroidism of renal origin: Secondary | ICD-10-CM | POA: Diagnosis not present

## 2016-11-06 DIAGNOSIS — N186 End stage renal disease: Secondary | ICD-10-CM | POA: Diagnosis not present

## 2016-11-06 DIAGNOSIS — E1121 Type 2 diabetes mellitus with diabetic nephropathy: Secondary | ICD-10-CM | POA: Diagnosis not present

## 2016-11-08 DIAGNOSIS — N2581 Secondary hyperparathyroidism of renal origin: Secondary | ICD-10-CM | POA: Diagnosis not present

## 2016-11-08 DIAGNOSIS — D509 Iron deficiency anemia, unspecified: Secondary | ICD-10-CM | POA: Diagnosis not present

## 2016-11-08 DIAGNOSIS — E1121 Type 2 diabetes mellitus with diabetic nephropathy: Secondary | ICD-10-CM | POA: Diagnosis not present

## 2016-11-08 DIAGNOSIS — D631 Anemia in chronic kidney disease: Secondary | ICD-10-CM | POA: Diagnosis not present

## 2016-11-08 DIAGNOSIS — N186 End stage renal disease: Secondary | ICD-10-CM | POA: Diagnosis not present

## 2016-11-11 DIAGNOSIS — D631 Anemia in chronic kidney disease: Secondary | ICD-10-CM | POA: Diagnosis not present

## 2016-11-11 DIAGNOSIS — N2581 Secondary hyperparathyroidism of renal origin: Secondary | ICD-10-CM | POA: Diagnosis not present

## 2016-11-11 DIAGNOSIS — E1121 Type 2 diabetes mellitus with diabetic nephropathy: Secondary | ICD-10-CM | POA: Diagnosis not present

## 2016-11-11 DIAGNOSIS — D509 Iron deficiency anemia, unspecified: Secondary | ICD-10-CM | POA: Diagnosis not present

## 2016-11-11 DIAGNOSIS — N186 End stage renal disease: Secondary | ICD-10-CM | POA: Diagnosis not present

## 2016-11-13 DIAGNOSIS — E1121 Type 2 diabetes mellitus with diabetic nephropathy: Secondary | ICD-10-CM | POA: Diagnosis not present

## 2016-11-13 DIAGNOSIS — N2581 Secondary hyperparathyroidism of renal origin: Secondary | ICD-10-CM | POA: Diagnosis not present

## 2016-11-13 DIAGNOSIS — D509 Iron deficiency anemia, unspecified: Secondary | ICD-10-CM | POA: Diagnosis not present

## 2016-11-13 DIAGNOSIS — D631 Anemia in chronic kidney disease: Secondary | ICD-10-CM | POA: Diagnosis not present

## 2016-11-13 DIAGNOSIS — N186 End stage renal disease: Secondary | ICD-10-CM | POA: Diagnosis not present

## 2016-11-14 DIAGNOSIS — N186 End stage renal disease: Secondary | ICD-10-CM | POA: Diagnosis not present

## 2016-11-14 DIAGNOSIS — E1122 Type 2 diabetes mellitus with diabetic chronic kidney disease: Secondary | ICD-10-CM | POA: Diagnosis not present

## 2016-11-14 DIAGNOSIS — Z992 Dependence on renal dialysis: Secondary | ICD-10-CM | POA: Diagnosis not present

## 2016-11-15 DIAGNOSIS — E1121 Type 2 diabetes mellitus with diabetic nephropathy: Secondary | ICD-10-CM | POA: Diagnosis not present

## 2016-11-15 DIAGNOSIS — N2581 Secondary hyperparathyroidism of renal origin: Secondary | ICD-10-CM | POA: Diagnosis not present

## 2016-11-15 DIAGNOSIS — D509 Iron deficiency anemia, unspecified: Secondary | ICD-10-CM | POA: Diagnosis not present

## 2016-11-15 DIAGNOSIS — N186 End stage renal disease: Secondary | ICD-10-CM | POA: Diagnosis not present

## 2016-11-15 DIAGNOSIS — D631 Anemia in chronic kidney disease: Secondary | ICD-10-CM | POA: Diagnosis not present

## 2016-11-18 DIAGNOSIS — D509 Iron deficiency anemia, unspecified: Secondary | ICD-10-CM | POA: Diagnosis not present

## 2016-11-18 DIAGNOSIS — N2581 Secondary hyperparathyroidism of renal origin: Secondary | ICD-10-CM | POA: Diagnosis not present

## 2016-11-18 DIAGNOSIS — D631 Anemia in chronic kidney disease: Secondary | ICD-10-CM | POA: Diagnosis not present

## 2016-11-18 DIAGNOSIS — E1121 Type 2 diabetes mellitus with diabetic nephropathy: Secondary | ICD-10-CM | POA: Diagnosis not present

## 2016-11-18 DIAGNOSIS — N186 End stage renal disease: Secondary | ICD-10-CM | POA: Diagnosis not present

## 2016-11-20 DIAGNOSIS — N2581 Secondary hyperparathyroidism of renal origin: Secondary | ICD-10-CM | POA: Diagnosis not present

## 2016-11-20 DIAGNOSIS — D631 Anemia in chronic kidney disease: Secondary | ICD-10-CM | POA: Diagnosis not present

## 2016-11-20 DIAGNOSIS — E1121 Type 2 diabetes mellitus with diabetic nephropathy: Secondary | ICD-10-CM | POA: Diagnosis not present

## 2016-11-20 DIAGNOSIS — N186 End stage renal disease: Secondary | ICD-10-CM | POA: Diagnosis not present

## 2016-11-20 DIAGNOSIS — D509 Iron deficiency anemia, unspecified: Secondary | ICD-10-CM | POA: Diagnosis not present

## 2016-11-21 ENCOUNTER — Encounter: Payer: Self-pay | Admitting: *Deleted

## 2016-11-21 ENCOUNTER — Ambulatory Visit (INDEPENDENT_AMBULATORY_CARE_PROVIDER_SITE_OTHER): Payer: Medicare Other | Admitting: *Deleted

## 2016-11-21 VITALS — BP 168/78 | HR 81 | Temp 98.7°F | Ht 72.0 in | Wt 267.0 lb

## 2016-11-21 DIAGNOSIS — Z114 Encounter for screening for human immunodeficiency virus [HIV]: Secondary | ICD-10-CM

## 2016-11-21 DIAGNOSIS — E119 Type 2 diabetes mellitus without complications: Secondary | ICD-10-CM | POA: Diagnosis not present

## 2016-11-21 DIAGNOSIS — Z0000 Encounter for general adult medical examination without abnormal findings: Secondary | ICD-10-CM

## 2016-11-21 LAB — POCT UA - MICROALBUMIN
Albumin/Creatinine Ratio, Urine, POC: >300
Creatinine, POC: 200 mg/dL
Microalbumin Ur, POC: 150 mg/L

## 2016-11-21 LAB — POCT GLYCOSYLATED HEMOGLOBIN (HGB A1C): Hemoglobin A1C: 5.3

## 2016-11-21 NOTE — Progress Notes (Signed)
52

## 2016-11-21 NOTE — Patient Instructions (Signed)
Troy Wells , Thank you for taking time to come for yourMedicare Wellness Visit. I appreciate your ongoing commitment to your health goals. Please review the following plan we discussed and let me know if I can assist you in the future.   These are the goals we discussed:  Goals    . Blood Pressure < 140/90    . Weight (lb) < 248 lb (112.5 kg) (pt-stated)          7% weight         Health Maintenance, Male A healthy lifestyle and preventive care is important for your health and wellness. Ask your health care provider about what schedule of regular examinations is right for you. What should I know about weight and diet? Eat a Healthy Diet  Eat plenty of vegetables, fruits, whole grains, low-fat dairy products, and lean protein.  Do not eat a lot of foods high in solid fats, added sugars, or salt.  Maintain a Healthy Weight Regular exercise can help you achieve or maintain a healthy weight. You should:  Do at least 150 minutes of exercise each week. The exercise should increase your heart rate and make you sweat (moderate-intensity exercise).  Do strength-training exercises at least twice a week.  Watch Your Levels of Cholesterol and Blood Lipids  Have your blood tested for lipids and cholesterol every 5 years starting at 52 years of age. If you are at high risk for heart disease, you should start having your blood tested when you are 52 years old. You may need to have your cholesterol levels checked more often if: ? Your lipid or cholesterol levels are high. ? You are older than 52 years of age. ? You are at high risk for heart disease.  What should I know about cancer screening? Many types of cancers can be detected early and may often be prevented. Lung Cancer  You should be screened every year for lung cancer if: ? You are a current smoker who has smoked for at least 30 years. ? You are a former smoker who has quit within the past 15 years.  Talk to your health care  provider about your screening options, when you should start screening, and how often you should be screened.  Colorectal Cancer  Routine colorectal cancer screening usually begins at 52 years of age and should be repeated every 5-10 years until you are 52 years old. You may need to be screened more often if early forms of precancerous polyps or small growths are found. Your health care provider may recommend screening at an earlier age if you have risk factors for colon cancer.  Your health care provider may recommend using home test kits to check for hidden blood in the stool.  A small camera at the end of a tube can be used to examine your colon (sigmoidoscopy or colonoscopy). This checks for the earliest forms of colorectal cancer.  Prostate and Testicular Cancer  Depending on your age and overall health, your health care provider may do certain tests to screen for prostate and testicular cancer.  Talk to your health care provider about any symptoms or concerns you have about testicular or prostate cancer.  Skin Cancer  Check your skin from head to toe regularly.  Tell your health care provider about any new moles or changes in moles, especially if: ? There is a change in a mole's size, shape, or color. ? You have a mole that is larger than a pencil eraser.  Always use sunscreen. Apply sunscreen liberally and repeat throughout the day.  Protect yourself by wearing long sleeves, pants, a wide-brimmed hat, and sunglasses when outside.  What should I know about heart disease, diabetes, and high blood pressure?  If you are 82-43 years of age, have your blood pressure checked every 3-5 years. If you are 55 years of age or older, have your blood pressure checked every year. You should have your blood pressure measured twice-once when you are at a hospital or clinic, and once when you are not at a hospital or clinic. Record the average of the two measurements. To check your blood pressure  when you are not at a hospital or clinic, you can use: ? An automated blood pressure machine at a pharmacy. ? A home blood pressure monitor.  Talk to your health care provider about your target blood pressure.  If you are between 31-32 years old, ask your health care provider if you should take aspirin to prevent heart disease.  Have regular diabetes screenings by checking your fasting blood sugar level. ? If you are at a normal weight and have a low risk for diabetes, have this test once every three years after the age of 29. ? If you are overweight and have a high risk for diabetes, consider being tested at a younger age or more often.  A one-time screening for abdominal aortic aneurysm (AAA) by ultrasound is recommended for men aged 16-75 years who are current or former smokers. What should I know about preventing infection? Hepatitis B If you have a higher risk for hepatitis B, you should be screened for this virus. Talk with your health care provider to find out if you are at risk for hepatitis B infection. Hepatitis C Blood testing is recommended for:  Everyone born from [birth year] through 1966.  Anyone with known risk factors for hepatitis C.  Sexually Transmitted Diseases (STDs)  You should be screened each year for STDs including gonorrhea and chlamydia if: ? You are sexually active and are younger than 53 years of age. ? You are older than 52 years of age and your health care provider tells you that you are at risk for this type of infection. ? Your sexual activity has changed since you were last screened and you are at an increased risk for chlamydia or gonorrhea. Ask your health care provider if you are at risk.  Talk with your health care provider about whether you are at high risk of being infected with HIV. Your health care provider may recommend a prescription medicine to help prevent HIV infection.  What else can I do?  Schedule regular health, dental, and eye  exams.  Stay current with your vaccines (immunizations).  Do not use any tobacco products, such as cigarettes, chewing tobacco, and e-cigarettes. If you need help quitting, ask your health care provider.  Limit alcohol intake to no more than 2 drinks per day. One drink equals 12 ounces of beer, 5 ounces of wine, or 1 ounces of hard liquor.  Do not use street drugs.  Do not share needles.  Ask your health care provider for help if you need support or information about quitting drugs.  Tell your health care provider if you often feel depressed.  Tell your health care provider if you have ever been abused or do not feel safe at home. This information is not intended to replace advice given to you by your health care provider. Make sure you discuss any  questions you have with your health care provider. Document Released: 11/30/2007 Document Revised: 01/31/2016 Document Reviewed: 03/07/2015 Elsevier Interactive Patient Education  2018 [place] in the Home Falls can cause injuries. They can happen to people of all ages. There are many things you can do to make your home safe and to help prevent falls. What can I do on the outside of my home?  Regularly fix the edges of walkways and driveways and fix any cracks.  Remove anything that might make you trip as you walk through a door, such as a raised step or threshold.  Trim any bushes or trees on the path to your home.  Use bright outdoor lighting.  Clear any walking paths of anything that might make someone trip, such as rocks or tools.  Regularly check to see if handrails are loose or broken. Make sure that both sides of any steps have handrails.  Any raised decks and porches should have guardrails on the edges.  Have any leaves, snow, or ice cleared regularly.  Use sand or salt on walking paths during winter.  Clean up any spills in your garage right away. This includes oil or grease spills. What can I do  in the bathroom?  Use night lights.  Install grab bars by the toilet and in the tub and shower. Do not use towel bars as grab bars.  Use non-skid mats or decals in the tub or shower.  If you need to sit down in the shower, use a plastic, non-slip stool.  Keep the floor dry. Clean up any water that spills on the floor as soon as it happens.  Remove soap buildup in the tub or shower regularly.  Attach bath mats securely with double-sided non-slip rug tape.  Do not have throw rugs and other things on the floor that can make you trip. What can I do in the bedroom?  Use night lights.  Make sure that you have a light by your bed that is easy to reach.  Do not use any sheets or blankets that are too big for your bed. They should not hang down onto the floor.  Have a firm chair that has side arms. You can use this for support while you get dressed.  Do not have throw rugs and other things on the floor that can make you trip. What can I do in the kitchen?  Clean up any spills right away.  Avoid walking on wet floors.  Keep items that you use a lot in easy-to-reach places.  If you need to reach something above you, use a strong step stool that has a grab bar.  Keep electrical cords out of the way.  Do not use floor polish or wax that makes floors slippery. If you must use wax, use non-skid floor wax.  Do not have throw rugs and other things on the floor that can make you trip. What can I do with my stairs?  Do not leave any items on the stairs.  Make sure that there are handrails on both sides of the stairs and use them. Fix handrails that are broken or loose. Make sure that handrails are as long as the stairways.  Check any carpeting to make sure that it is firmly attached to the stairs. Fix any carpet that is loose or worn.  Avoid having throw rugs at the top or bottom of the stairs. If you do have throw rugs, attach them to the floor with carpet  tape.  Make sure that you  have a light switch at the top of the stairs and the bottom of the stairs. If you do not have them, ask someone to add them for you. What else can I do to help prevent falls?  Wear shoes that: ? Do not have high heels. ? Have rubber bottoms. ? Are comfortable and fit you well. ? Are closed at the toe. Do not wear sandals.  If you use a stepladder: ? Make sure that it is fully opened. Do not climb a closed stepladder. ? Make sure that both sides of the stepladder are locked into place. ? Ask someone to hold it for you, if possible.  Clearly mark and make sure that you can see: ? Any grab bars or handrails. ? First and last steps. ? Where the edge of each step is.  Use tools that help you move around (mobility aids) if they are needed. These include: ? Canes. ? Walkers. ? Scooters. ? Crutches.  Turn on the lights when you go into a dark area. Replace any light bulbs as soon as they burn out.  Set up your furniture so you have a clear path. Avoid moving your furniture around.  If any of your floors are uneven, fix them.  If there are any pets around you, be aware of where they are.  Review your medicines with your doctor. Some medicines can make you feel dizzy. This can increase your chance of falling. Ask your doctor what other things that you can do to help prevent falls. This information is not intended to replace advice given to you by your health care provider. Make sure you discuss any questions you have with your health care provider. Document Released: 03/30/2009 Document Revised: 11/09/2015 Document Reviewed: 07/08/2014 Elsevier Interactive Patient Education  [employer].

## 2016-11-22 ENCOUNTER — Telehealth: Payer: Self-pay | Admitting: *Deleted

## 2016-11-22 ENCOUNTER — Other Ambulatory Visit: Payer: Self-pay | Admitting: *Deleted

## 2016-11-22 ENCOUNTER — Encounter: Payer: Self-pay | Admitting: *Deleted

## 2016-11-22 DIAGNOSIS — N2581 Secondary hyperparathyroidism of renal origin: Secondary | ICD-10-CM | POA: Diagnosis not present

## 2016-11-22 DIAGNOSIS — D509 Iron deficiency anemia, unspecified: Secondary | ICD-10-CM | POA: Diagnosis not present

## 2016-11-22 DIAGNOSIS — N186 End stage renal disease: Secondary | ICD-10-CM | POA: Diagnosis not present

## 2016-11-22 DIAGNOSIS — D631 Anemia in chronic kidney disease: Secondary | ICD-10-CM | POA: Diagnosis not present

## 2016-11-22 DIAGNOSIS — E1121 Type 2 diabetes mellitus with diabetic nephropathy: Secondary | ICD-10-CM | POA: Diagnosis not present

## 2016-11-22 LAB — HIV ANTIBODY (ROUTINE TESTING W REFLEX): HIV Screen 4th Generation wRfx: NONREACTIVE

## 2016-11-22 NOTE — Progress Notes (Signed)
I have reviewed this visit and discussed with Lauren Ducatte, RN, BSN, and agree with her documentation.   Renie Stelmach MD 

## 2016-11-22 NOTE — Progress Notes (Signed)
Subjective:   Troy Wells is a 51 y.o. male who presents for an Initial Medicare Annual Wellness Visit.  Cardiac Risk Factors include: diabetes mellitus;dyslipidemia;hypertension;male gender;obesity (BMI >30kg/m2);microalbuminuria;smoking/ tobacco exposure    Objective:    Today's Vitals   11/21/16 1358 11/21/16 1406  BP: (!) 168/80 (!) 168/78  Pulse: 81   Temp: 98.7 F (37.1 C)   TempSrc: Oral   SpO2: 94%   Weight: 267 lb (121.1 kg)   Height: 6' (1.829 m)   PainSc: 0-No pain    Body mass index is 36.21 kg/m.  BP recheck 168/78 right arm manually with adult cuff Patient states he took his BP meds at 0430 this AM Usually takes coreg at 0430 and at bedtime (2330). Discussed taking 12 hours apart for consistency. Patient encouraged to schedule f/u with new PCP (Dr. Gwendlyn Deutscher) to discuss BP.   Current Medications (verified) Outpatient Encounter Prescriptions as of 11/21/2016  Medication Sig  . acetaminophen (TYLENOL) 500 MG tablet Take 500 mg by mouth every 6 (six) hours as needed for mild pain or moderate pain.  Marland Kitchen allopurinol (ZYLOPRIM) 100 MG tablet Take 100 mg by mouth daily.  Marland Kitchen amLODipine (NORVASC) 10 MG tablet Take 10 mg by mouth daily.  Marland Kitchen atorvastatin (LIPITOR) 40 MG tablet TAKE ONE TABLET BY MOUTH ONCE DAILY  . carvedilol (COREG) 25 MG tablet Take 1 tablet (25 mg total) by mouth 2 (two) times daily with a meal.  . cetirizine (ZYRTEC) 5 MG tablet Take 1 tablet (5 mg total) by mouth daily.  . cyclobenzaprine (FLEXERIL) 10 MG tablet Take 1 tablet (10 mg total) by mouth 2 (two) times daily as needed for muscle spasms.  Marland Kitchen FOSRENOL 1000 MG chewable tablet   . gabapentin (NEURONTIN) 300 MG capsule TAKE ONE CAPSULE BY MOUTH THREE TIMES DAILY  . glipiZIDE (GLUCOTROL) 5 MG tablet Take 5 mg by mouth daily.  . minoxidil (LONITEN) 10 MG tablet Take 10 mg by mouth 2 (two) times daily.  . Olopatadine HCl (PATADAY) 0.2 % SOLN One drop to each eye once daily  . RENVELA 800 MG tablet    . SENSIPAR 30 MG tablet   . torsemide (DEMADEX) 100 MG tablet Take 100 mg by mouth daily. Take on Tuesday, Thursday, Saturday, and Sunday  . aspirin 81 MG tablet Take 81 mg by mouth daily. Reported on 01/02/2016  . colchicine 0.6 MG tablet Take 0.6 mg by mouth 2 (two) times daily.  . predniSONE (DELTASONE) 10 MG tablet   . Tdap (BOOSTRIX) 5-2.5-18.5 LF-MCG/0.5 injection Inject 0.5 mLs into the muscle once.  . [DISCONTINUED] acetaminophen-codeine (TYLENOL #3) 300-30 MG tablet Take 1 tablet by mouth every 8 (eight) hours as needed for moderate pain. (Patient not taking: Reported on 11/21/2016)   No facility-administered encounter medications on file as of 11/21/2016.     Allergies (verified) Patient has no known allergies.   History: Past Medical History:  Diagnosis Date  . Allergy   . Chronic kidney disease    Chronic Renal Insufficiency  . Coronary artery disease 08/15/2009   with AMI  . Diabetes mellitus without complication ([place])   . Dialysis patient [facility]) 09-27-2014   mon, wed, and fri  . Hyperlipidemia   . Hypertension   . Myocardial infarction ([place])    10 yrs ago per pt  . OSA treated with BiPAP 06/17/2010  . Sleep apnea    cpap nightly   Past Surgical History:  Procedure Laterality Date  . AV FISTULA PLACEMENT Left 09/27/2014  .  NECK SURGERY     C6 & C7 replaced 30 yrs ago per pt   Family History  Problem Relation Age of Onset  . Hypertension Mother   . Heart disease Mother 68       cause of death  . Hypertension Father   . Aneurysm Father 52       brain aneurysm  . Heart disease Father 41       cause of death  . Hypertension Brother   . Alcohol abuse Brother   . Diabetes Brother   . Pancreatitis Brother   . Hypertension Brother   . Colon cancer Neg Hx    Social History   Occupational History  . Not on file.   Social History Main Topics  . Smoking status: Former Smoker    Packs/day: 1.00    Years: 10.00    Types: Cigars    Quit date: 06/18/2011  .  Smokeless tobacco: Never Used  . Alcohol use 0.6 oz/week    1 Standard drinks or equivalent per week     Comment: beer or liquor occasionally  . Drug use: No  . Sexual activity: Yes   Tobacco Counseling Patient is former smoker with no plans to restart  Activities of Daily Living In your present state of health, do you have any difficulty performing the following activities: 11/22/2016  Hearing? N  Vision? N  Difficulty concentrating or making decisions? N  Walking or climbing stairs? N  Dressing or bathing? N  Doing errands, shopping? N  Preparing Food and eating ? N  Using the Toilet? N  In the past six months, have you accidently leaked urine? N  Do you have problems with loss of bowel control? N  Managing your Medications? N  Managing your Finances? N  Housekeeping or managing your Housekeeping? N  Some recent data might be hidden   Home Safety:  My home has a working smoke alarm:  Yes X 3           My home throw rugs have been fastened down to the floor or removed:  Removed I have a non-slip surface or non-slip mats in the bathtub and shower:  Yes        All my home's stairs have handrails, including any outdoor stairs  Two level home with handrails inside and 3 outside steps without handrails. Patient has a post that he can hold onto if needed.         My home's floors, stairs and hallways are free from clutter, wires and cords:  Yes     I have animals in my home  No I wear seatbelts consistently:  Yes    Immunizations and Health Maintenance Immunization History  Administered Date(s) Administered  . Influenza-Unspecified 03/06/2016  . Pneumococcal Polysaccharide-23 03/29/2016   Health Maintenance Due  Topic Date Due  . OPHTHALMOLOGY EXAM  10/01/1974  . HIV Screening  10/01/1979  . TETANUS/TDAP  10/01/1983  HIV drawn  Hgb A1c done = 5.3 down from 6.1 on 04/02/2016 Urine micro albumin done = Positive. PCP notified Patient will schedule with Dr Nicki Reaper for an eye  exam  Patient Care Team: [name], MD as PCP - General (Family Medicine) [name], MD as Referring Physician (Specialist) Dr. Nicki Reaper on [address] for eye care  Indicate any recent Medical Services you may have received from other than [facility] providers in the past year (date may be approximate).    Assessment:   This is  a routine wellness examination for [place].   Hearing/Vision screen  Hearing Screening   Method: Audiometry   125Hz  250Hz  500Hz  1000Hz  2000Hz  3000Hz  4000Hz  6000Hz  8000Hz   Right ear:   25 25 25   Fail    Left ear:   25 25 25   Fail      Dietary issues and exercise activities discussed: Current Exercise Habits: Home exercise routine, Type of exercise: walking, Time (Minutes): 45, Frequency (Times/Week): 3, Weekly Exercise (Minutes/Week): 135, Intensity: Mild, Exercise limited by: None identified  Goals    . Blood Pressure < 140/90    . Weight (lb) < 248 lb (112.5 kg) (pt-stated)          7% weight      Discussed recording consumption intake to assist with desired 7% weight loss. Recommended MyPLate or My Fitness Pal apps to assist with recording. Discussed engaging in physical activity 150 min/week  Depression Screen PHQ 2/9 Scores 11/22/2016 04/02/2016 08/31/2015  PHQ - 2 Score 0 0 0    Fall Risk Fall Risk  11/22/2016  Falls in the past year? No   TUG Test:  Done in 9 seconds. Patient used both hands to push out of chair and one hand to sit back down. Falls prevention discussed in detail and literature given.  Cognitive Function: Mini-Cog  Passed with score 5/5   Screening Tests Health Maintenance  Topic Date Due  . OPHTHALMOLOGY EXAM  10/01/1974  . HIV Screening  10/01/1979  . TETANUS/TDAP  10/01/1983  . INFLUENZA VACCINE  01/15/2017  . FOOT EXAM  04/02/2017  . HEMOGLOBIN A1C  05/23/2017  . URINE MICROALBUMIN  11/21/2017  . COLONOSCOPY  01/01/2021  . PNEUMOCOCCAL POLYSACCHARIDE VACCINE (2) 03/29/2021        Plan:     BP recheck  168/78 right arm manually with adult cuff Patient states he took his BP meds at 0430 this AM Usually takes coreg at 0430 and at bedtime (2330). Discussed taking 12 hours apart for consistency. Patient encouraged to schedule f/u with new PCP (Dr. Gwendlyn Deutscher) to discuss BP.  HIV drawn  Hgb A1c done = 5.3 down from 6.1 on 04/02/2016 Urine micro albumin done = Positive. PCP notified Patient will schedule with Dr Nicki Reaper for an eye exam  I have personally reviewed and noted the following in the patient's chart:   . Medical and social history . Use of alcohol, tobacco or illicit drugs  . Current medications and supplements . Functional ability and status . Nutritional status . Physical activity . Advanced directives . List of other physicians . Hospitalizations, surgeries, and ER visits in previous 12 months . Vitals . Screenings to include cognitive, depression, and falls . Referrals and appointments  In addition, I have reviewed and discussed with patient certain preventive protocols, quality metrics, and best practice recommendations. A written personalized care plan for preventive services as well as general preventive health recommendations were provided to patient.     Velora Heckler, RN   11/22/2016

## 2016-11-22 NOTE — Telephone Encounter (Signed)
Called to schedule f/u with new PCP, Dr. Gwendlyn Deutscher. If patient returns call please scheduler first available for f/u on HTN. Hubbard Hartshorn, RN, BSN

## 2016-11-22 NOTE — Telephone Encounter (Signed)
Attempted to call patient as I had questions about medication refills. No answer. Left message that I would call again at a later time.

## 2016-11-25 DIAGNOSIS — D509 Iron deficiency anemia, unspecified: Secondary | ICD-10-CM | POA: Diagnosis not present

## 2016-11-25 DIAGNOSIS — N2581 Secondary hyperparathyroidism of renal origin: Secondary | ICD-10-CM | POA: Diagnosis not present

## 2016-11-25 DIAGNOSIS — D631 Anemia in chronic kidney disease: Secondary | ICD-10-CM | POA: Diagnosis not present

## 2016-11-25 DIAGNOSIS — N186 End stage renal disease: Secondary | ICD-10-CM | POA: Diagnosis not present

## 2016-11-25 DIAGNOSIS — E1121 Type 2 diabetes mellitus with diabetic nephropathy: Secondary | ICD-10-CM | POA: Diagnosis not present

## 2016-11-25 MED ORDER — CETIRIZINE HCL 5 MG PO TABS: 5.0000 mg | ORAL_TABLET | Freq: Every day | ORAL | 1 refills | Status: DC

## 2016-11-25 MED ORDER — OLOPATADINE HCL 0.2 % OP SOLN: OPHTHALMIC | 1 refills | Status: DC

## 2016-11-25 NOTE — Telephone Encounter (Signed)
Torsemide to be filled by Nephrology. Patient to call and ask for refill.

## 2016-11-25 NOTE — Telephone Encounter (Signed)
Attempted to contact patient, no answer, left message for patient to return my call.

## 2016-11-27 DIAGNOSIS — N186 End stage renal disease: Secondary | ICD-10-CM | POA: Diagnosis not present

## 2016-11-27 DIAGNOSIS — E1121 Type 2 diabetes mellitus with diabetic nephropathy: Secondary | ICD-10-CM | POA: Diagnosis not present

## 2016-11-27 DIAGNOSIS — D631 Anemia in chronic kidney disease: Secondary | ICD-10-CM | POA: Diagnosis not present

## 2016-11-27 DIAGNOSIS — N2581 Secondary hyperparathyroidism of renal origin: Secondary | ICD-10-CM | POA: Diagnosis not present

## 2016-11-27 DIAGNOSIS — D509 Iron deficiency anemia, unspecified: Secondary | ICD-10-CM | POA: Diagnosis not present

## 2016-11-29 DIAGNOSIS — N186 End stage renal disease: Secondary | ICD-10-CM | POA: Diagnosis not present

## 2016-11-29 DIAGNOSIS — D509 Iron deficiency anemia, unspecified: Secondary | ICD-10-CM | POA: Diagnosis not present

## 2016-11-29 DIAGNOSIS — D631 Anemia in chronic kidney disease: Secondary | ICD-10-CM | POA: Diagnosis not present

## 2016-11-29 DIAGNOSIS — E1121 Type 2 diabetes mellitus with diabetic nephropathy: Secondary | ICD-10-CM | POA: Diagnosis not present

## 2016-11-29 DIAGNOSIS — N2581 Secondary hyperparathyroidism of renal origin: Secondary | ICD-10-CM | POA: Diagnosis not present

## 2016-12-02 DIAGNOSIS — N2581 Secondary hyperparathyroidism of renal origin: Secondary | ICD-10-CM | POA: Diagnosis not present

## 2016-12-02 DIAGNOSIS — D509 Iron deficiency anemia, unspecified: Secondary | ICD-10-CM | POA: Diagnosis not present

## 2016-12-02 DIAGNOSIS — D631 Anemia in chronic kidney disease: Secondary | ICD-10-CM | POA: Diagnosis not present

## 2016-12-02 DIAGNOSIS — N186 End stage renal disease: Secondary | ICD-10-CM | POA: Diagnosis not present

## 2016-12-02 DIAGNOSIS — E1121 Type 2 diabetes mellitus with diabetic nephropathy: Secondary | ICD-10-CM | POA: Diagnosis not present

## 2016-12-04 DIAGNOSIS — D509 Iron deficiency anemia, unspecified: Secondary | ICD-10-CM | POA: Diagnosis not present

## 2016-12-04 DIAGNOSIS — D631 Anemia in chronic kidney disease: Secondary | ICD-10-CM | POA: Diagnosis not present

## 2016-12-04 DIAGNOSIS — N2581 Secondary hyperparathyroidism of renal origin: Secondary | ICD-10-CM | POA: Diagnosis not present

## 2016-12-04 DIAGNOSIS — N186 End stage renal disease: Secondary | ICD-10-CM | POA: Diagnosis not present

## 2016-12-04 DIAGNOSIS — E1121 Type 2 diabetes mellitus with diabetic nephropathy: Secondary | ICD-10-CM | POA: Diagnosis not present

## 2016-12-06 DIAGNOSIS — D509 Iron deficiency anemia, unspecified: Secondary | ICD-10-CM | POA: Diagnosis not present

## 2016-12-06 DIAGNOSIS — N186 End stage renal disease: Secondary | ICD-10-CM | POA: Diagnosis not present

## 2016-12-06 DIAGNOSIS — N2581 Secondary hyperparathyroidism of renal origin: Secondary | ICD-10-CM | POA: Diagnosis not present

## 2016-12-06 DIAGNOSIS — D631 Anemia in chronic kidney disease: Secondary | ICD-10-CM | POA: Diagnosis not present

## 2016-12-06 DIAGNOSIS — E1121 Type 2 diabetes mellitus with diabetic nephropathy: Secondary | ICD-10-CM | POA: Diagnosis not present

## 2016-12-09 DIAGNOSIS — N2581 Secondary hyperparathyroidism of renal origin: Secondary | ICD-10-CM | POA: Diagnosis not present

## 2016-12-09 DIAGNOSIS — D509 Iron deficiency anemia, unspecified: Secondary | ICD-10-CM | POA: Diagnosis not present

## 2016-12-09 DIAGNOSIS — E1121 Type 2 diabetes mellitus with diabetic nephropathy: Secondary | ICD-10-CM | POA: Diagnosis not present

## 2016-12-09 DIAGNOSIS — N186 End stage renal disease: Secondary | ICD-10-CM | POA: Diagnosis not present

## 2016-12-09 DIAGNOSIS — D631 Anemia in chronic kidney disease: Secondary | ICD-10-CM | POA: Diagnosis not present

## 2016-12-11 DIAGNOSIS — D509 Iron deficiency anemia, unspecified: Secondary | ICD-10-CM | POA: Diagnosis not present

## 2016-12-11 DIAGNOSIS — N2581 Secondary hyperparathyroidism of renal origin: Secondary | ICD-10-CM | POA: Diagnosis not present

## 2016-12-11 DIAGNOSIS — E1121 Type 2 diabetes mellitus with diabetic nephropathy: Secondary | ICD-10-CM | POA: Diagnosis not present

## 2016-12-11 DIAGNOSIS — D631 Anemia in chronic kidney disease: Secondary | ICD-10-CM | POA: Diagnosis not present

## 2016-12-11 DIAGNOSIS — N186 End stage renal disease: Secondary | ICD-10-CM | POA: Diagnosis not present

## 2016-12-13 DIAGNOSIS — D631 Anemia in chronic kidney disease: Secondary | ICD-10-CM | POA: Diagnosis not present

## 2016-12-13 DIAGNOSIS — N186 End stage renal disease: Secondary | ICD-10-CM | POA: Diagnosis not present

## 2016-12-13 DIAGNOSIS — N2581 Secondary hyperparathyroidism of renal origin: Secondary | ICD-10-CM | POA: Diagnosis not present

## 2016-12-13 DIAGNOSIS — D509 Iron deficiency anemia, unspecified: Secondary | ICD-10-CM | POA: Diagnosis not present

## 2016-12-13 DIAGNOSIS — E1121 Type 2 diabetes mellitus with diabetic nephropathy: Secondary | ICD-10-CM | POA: Diagnosis not present

## 2016-12-14 DIAGNOSIS — N186 End stage renal disease: Secondary | ICD-10-CM | POA: Diagnosis not present

## 2016-12-14 DIAGNOSIS — E1122 Type 2 diabetes mellitus with diabetic chronic kidney disease: Secondary | ICD-10-CM | POA: Diagnosis not present

## 2016-12-14 DIAGNOSIS — Z992 Dependence on renal dialysis: Secondary | ICD-10-CM | POA: Diagnosis not present

## 2016-12-16 DIAGNOSIS — E1121 Type 2 diabetes mellitus with diabetic nephropathy: Secondary | ICD-10-CM | POA: Diagnosis not present

## 2016-12-16 DIAGNOSIS — N2581 Secondary hyperparathyroidism of renal origin: Secondary | ICD-10-CM | POA: Diagnosis not present

## 2016-12-16 DIAGNOSIS — D509 Iron deficiency anemia, unspecified: Secondary | ICD-10-CM | POA: Diagnosis not present

## 2016-12-16 DIAGNOSIS — N186 End stage renal disease: Secondary | ICD-10-CM | POA: Diagnosis not present

## 2016-12-18 DIAGNOSIS — D509 Iron deficiency anemia, unspecified: Secondary | ICD-10-CM | POA: Diagnosis not present

## 2016-12-18 DIAGNOSIS — N186 End stage renal disease: Secondary | ICD-10-CM | POA: Diagnosis not present

## 2016-12-18 DIAGNOSIS — N2581 Secondary hyperparathyroidism of renal origin: Secondary | ICD-10-CM | POA: Diagnosis not present

## 2016-12-18 DIAGNOSIS — E1121 Type 2 diabetes mellitus with diabetic nephropathy: Secondary | ICD-10-CM | POA: Diagnosis not present

## 2016-12-20 DIAGNOSIS — N2581 Secondary hyperparathyroidism of renal origin: Secondary | ICD-10-CM | POA: Diagnosis not present

## 2016-12-20 DIAGNOSIS — D509 Iron deficiency anemia, unspecified: Secondary | ICD-10-CM | POA: Diagnosis not present

## 2016-12-20 DIAGNOSIS — N186 End stage renal disease: Secondary | ICD-10-CM | POA: Diagnosis not present

## 2016-12-20 DIAGNOSIS — E1121 Type 2 diabetes mellitus with diabetic nephropathy: Secondary | ICD-10-CM | POA: Diagnosis not present

## 2016-12-23 DIAGNOSIS — N186 End stage renal disease: Secondary | ICD-10-CM | POA: Diagnosis not present

## 2016-12-23 DIAGNOSIS — E1121 Type 2 diabetes mellitus with diabetic nephropathy: Secondary | ICD-10-CM | POA: Diagnosis not present

## 2016-12-23 DIAGNOSIS — N2581 Secondary hyperparathyroidism of renal origin: Secondary | ICD-10-CM | POA: Diagnosis not present

## 2016-12-23 DIAGNOSIS — D509 Iron deficiency anemia, unspecified: Secondary | ICD-10-CM | POA: Diagnosis not present

## 2016-12-25 DIAGNOSIS — D509 Iron deficiency anemia, unspecified: Secondary | ICD-10-CM | POA: Diagnosis not present

## 2016-12-25 DIAGNOSIS — N186 End stage renal disease: Secondary | ICD-10-CM | POA: Diagnosis not present

## 2016-12-25 DIAGNOSIS — N2581 Secondary hyperparathyroidism of renal origin: Secondary | ICD-10-CM | POA: Diagnosis not present

## 2016-12-25 DIAGNOSIS — E1121 Type 2 diabetes mellitus with diabetic nephropathy: Secondary | ICD-10-CM | POA: Diagnosis not present

## 2016-12-26 DIAGNOSIS — E113293 Type 2 diabetes mellitus with mild nonproliferative diabetic retinopathy without macular edema, bilateral: Secondary | ICD-10-CM | POA: Diagnosis not present

## 2016-12-26 LAB — HM DIABETES EYE EXAM

## 2016-12-27 DIAGNOSIS — E1121 Type 2 diabetes mellitus with diabetic nephropathy: Secondary | ICD-10-CM | POA: Diagnosis not present

## 2016-12-27 DIAGNOSIS — N2581 Secondary hyperparathyroidism of renal origin: Secondary | ICD-10-CM | POA: Diagnosis not present

## 2016-12-27 DIAGNOSIS — D509 Iron deficiency anemia, unspecified: Secondary | ICD-10-CM | POA: Diagnosis not present

## 2016-12-27 DIAGNOSIS — N186 End stage renal disease: Secondary | ICD-10-CM | POA: Diagnosis not present

## 2016-12-30 DIAGNOSIS — N186 End stage renal disease: Secondary | ICD-10-CM | POA: Diagnosis not present

## 2016-12-30 DIAGNOSIS — N2581 Secondary hyperparathyroidism of renal origin: Secondary | ICD-10-CM | POA: Diagnosis not present

## 2016-12-30 DIAGNOSIS — D509 Iron deficiency anemia, unspecified: Secondary | ICD-10-CM | POA: Diagnosis not present

## 2016-12-30 DIAGNOSIS — E1121 Type 2 diabetes mellitus with diabetic nephropathy: Secondary | ICD-10-CM | POA: Diagnosis not present

## 2017-01-01 DIAGNOSIS — N2581 Secondary hyperparathyroidism of renal origin: Secondary | ICD-10-CM | POA: Diagnosis not present

## 2017-01-01 DIAGNOSIS — N186 End stage renal disease: Secondary | ICD-10-CM | POA: Diagnosis not present

## 2017-01-01 DIAGNOSIS — E1121 Type 2 diabetes mellitus with diabetic nephropathy: Secondary | ICD-10-CM | POA: Diagnosis not present

## 2017-01-01 DIAGNOSIS — D509 Iron deficiency anemia, unspecified: Secondary | ICD-10-CM | POA: Diagnosis not present

## 2017-01-03 DIAGNOSIS — E1121 Type 2 diabetes mellitus with diabetic nephropathy: Secondary | ICD-10-CM | POA: Diagnosis not present

## 2017-01-03 DIAGNOSIS — D509 Iron deficiency anemia, unspecified: Secondary | ICD-10-CM | POA: Diagnosis not present

## 2017-01-03 DIAGNOSIS — N186 End stage renal disease: Secondary | ICD-10-CM | POA: Diagnosis not present

## 2017-01-03 DIAGNOSIS — N2581 Secondary hyperparathyroidism of renal origin: Secondary | ICD-10-CM | POA: Diagnosis not present

## 2017-01-06 DIAGNOSIS — N186 End stage renal disease: Secondary | ICD-10-CM | POA: Diagnosis not present

## 2017-01-06 DIAGNOSIS — E1121 Type 2 diabetes mellitus with diabetic nephropathy: Secondary | ICD-10-CM | POA: Diagnosis not present

## 2017-01-06 DIAGNOSIS — D509 Iron deficiency anemia, unspecified: Secondary | ICD-10-CM | POA: Diagnosis not present

## 2017-01-06 DIAGNOSIS — N2581 Secondary hyperparathyroidism of renal origin: Secondary | ICD-10-CM | POA: Diagnosis not present

## 2017-01-08 DIAGNOSIS — D509 Iron deficiency anemia, unspecified: Secondary | ICD-10-CM | POA: Diagnosis not present

## 2017-01-08 DIAGNOSIS — N186 End stage renal disease: Secondary | ICD-10-CM | POA: Diagnosis not present

## 2017-01-08 DIAGNOSIS — N2581 Secondary hyperparathyroidism of renal origin: Secondary | ICD-10-CM | POA: Diagnosis not present

## 2017-01-08 DIAGNOSIS — E1121 Type 2 diabetes mellitus with diabetic nephropathy: Secondary | ICD-10-CM | POA: Diagnosis not present

## 2017-01-10 DIAGNOSIS — D509 Iron deficiency anemia, unspecified: Secondary | ICD-10-CM | POA: Diagnosis not present

## 2017-01-10 DIAGNOSIS — N186 End stage renal disease: Secondary | ICD-10-CM | POA: Diagnosis not present

## 2017-01-10 DIAGNOSIS — N2581 Secondary hyperparathyroidism of renal origin: Secondary | ICD-10-CM | POA: Diagnosis not present

## 2017-01-10 DIAGNOSIS — E1121 Type 2 diabetes mellitus with diabetic nephropathy: Secondary | ICD-10-CM | POA: Diagnosis not present

## 2017-01-13 DIAGNOSIS — E1121 Type 2 diabetes mellitus with diabetic nephropathy: Secondary | ICD-10-CM | POA: Diagnosis not present

## 2017-01-13 DIAGNOSIS — D509 Iron deficiency anemia, unspecified: Secondary | ICD-10-CM | POA: Diagnosis not present

## 2017-01-13 DIAGNOSIS — N2581 Secondary hyperparathyroidism of renal origin: Secondary | ICD-10-CM | POA: Diagnosis not present

## 2017-01-13 DIAGNOSIS — N186 End stage renal disease: Secondary | ICD-10-CM | POA: Diagnosis not present

## 2017-01-14 DIAGNOSIS — I10 Essential (primary) hypertension: Secondary | ICD-10-CM | POA: Diagnosis not present

## 2017-01-14 DIAGNOSIS — E1122 Type 2 diabetes mellitus with diabetic chronic kidney disease: Secondary | ICD-10-CM | POA: Diagnosis not present

## 2017-01-14 DIAGNOSIS — N186 End stage renal disease: Secondary | ICD-10-CM | POA: Diagnosis not present

## 2017-01-14 DIAGNOSIS — E1140 Type 2 diabetes mellitus with diabetic neuropathy, unspecified: Secondary | ICD-10-CM | POA: Diagnosis not present

## 2017-01-14 DIAGNOSIS — Z992 Dependence on renal dialysis: Secondary | ICD-10-CM | POA: Diagnosis not present

## 2017-01-15 DIAGNOSIS — E1121 Type 2 diabetes mellitus with diabetic nephropathy: Secondary | ICD-10-CM | POA: Diagnosis not present

## 2017-01-15 DIAGNOSIS — N186 End stage renal disease: Secondary | ICD-10-CM | POA: Diagnosis not present

## 2017-01-15 DIAGNOSIS — Z23 Encounter for immunization: Secondary | ICD-10-CM | POA: Diagnosis not present

## 2017-01-15 DIAGNOSIS — D631 Anemia in chronic kidney disease: Secondary | ICD-10-CM | POA: Diagnosis not present

## 2017-01-15 DIAGNOSIS — N2581 Secondary hyperparathyroidism of renal origin: Secondary | ICD-10-CM | POA: Diagnosis not present

## 2017-01-17 DIAGNOSIS — N2581 Secondary hyperparathyroidism of renal origin: Secondary | ICD-10-CM | POA: Diagnosis not present

## 2017-01-17 DIAGNOSIS — N186 End stage renal disease: Secondary | ICD-10-CM | POA: Diagnosis not present

## 2017-01-17 DIAGNOSIS — D631 Anemia in chronic kidney disease: Secondary | ICD-10-CM | POA: Diagnosis not present

## 2017-01-17 DIAGNOSIS — Z23 Encounter for immunization: Secondary | ICD-10-CM | POA: Diagnosis not present

## 2017-01-17 DIAGNOSIS — E1121 Type 2 diabetes mellitus with diabetic nephropathy: Secondary | ICD-10-CM | POA: Diagnosis not present

## 2017-01-20 DIAGNOSIS — N2581 Secondary hyperparathyroidism of renal origin: Secondary | ICD-10-CM | POA: Diagnosis not present

## 2017-01-20 DIAGNOSIS — E1121 Type 2 diabetes mellitus with diabetic nephropathy: Secondary | ICD-10-CM | POA: Diagnosis not present

## 2017-01-20 DIAGNOSIS — D631 Anemia in chronic kidney disease: Secondary | ICD-10-CM | POA: Diagnosis not present

## 2017-01-20 DIAGNOSIS — N186 End stage renal disease: Secondary | ICD-10-CM | POA: Diagnosis not present

## 2017-01-20 DIAGNOSIS — Z23 Encounter for immunization: Secondary | ICD-10-CM | POA: Diagnosis not present

## 2017-01-22 DIAGNOSIS — D631 Anemia in chronic kidney disease: Secondary | ICD-10-CM | POA: Diagnosis not present

## 2017-01-22 DIAGNOSIS — N2581 Secondary hyperparathyroidism of renal origin: Secondary | ICD-10-CM | POA: Diagnosis not present

## 2017-01-22 DIAGNOSIS — E1121 Type 2 diabetes mellitus with diabetic nephropathy: Secondary | ICD-10-CM | POA: Diagnosis not present

## 2017-01-22 DIAGNOSIS — N186 End stage renal disease: Secondary | ICD-10-CM | POA: Diagnosis not present

## 2017-01-22 DIAGNOSIS — Z23 Encounter for immunization: Secondary | ICD-10-CM | POA: Diagnosis not present

## 2017-01-24 DIAGNOSIS — Z23 Encounter for immunization: Secondary | ICD-10-CM | POA: Diagnosis not present

## 2017-01-24 DIAGNOSIS — E1121 Type 2 diabetes mellitus with diabetic nephropathy: Secondary | ICD-10-CM | POA: Diagnosis not present

## 2017-01-24 DIAGNOSIS — N2581 Secondary hyperparathyroidism of renal origin: Secondary | ICD-10-CM | POA: Diagnosis not present

## 2017-01-24 DIAGNOSIS — N186 End stage renal disease: Secondary | ICD-10-CM | POA: Diagnosis not present

## 2017-01-24 DIAGNOSIS — D631 Anemia in chronic kidney disease: Secondary | ICD-10-CM | POA: Diagnosis not present

## 2017-01-27 DIAGNOSIS — E1121 Type 2 diabetes mellitus with diabetic nephropathy: Secondary | ICD-10-CM | POA: Diagnosis not present

## 2017-01-27 DIAGNOSIS — Z23 Encounter for immunization: Secondary | ICD-10-CM | POA: Diagnosis not present

## 2017-01-27 DIAGNOSIS — D631 Anemia in chronic kidney disease: Secondary | ICD-10-CM | POA: Diagnosis not present

## 2017-01-27 DIAGNOSIS — N186 End stage renal disease: Secondary | ICD-10-CM | POA: Diagnosis not present

## 2017-01-27 DIAGNOSIS — N2581 Secondary hyperparathyroidism of renal origin: Secondary | ICD-10-CM | POA: Diagnosis not present

## 2017-01-29 DIAGNOSIS — E1121 Type 2 diabetes mellitus with diabetic nephropathy: Secondary | ICD-10-CM | POA: Diagnosis not present

## 2017-01-29 DIAGNOSIS — N2581 Secondary hyperparathyroidism of renal origin: Secondary | ICD-10-CM | POA: Diagnosis not present

## 2017-01-29 DIAGNOSIS — D631 Anemia in chronic kidney disease: Secondary | ICD-10-CM | POA: Diagnosis not present

## 2017-01-29 DIAGNOSIS — Z23 Encounter for immunization: Secondary | ICD-10-CM | POA: Diagnosis not present

## 2017-01-29 DIAGNOSIS — N186 End stage renal disease: Secondary | ICD-10-CM | POA: Diagnosis not present

## 2017-01-31 DIAGNOSIS — Z23 Encounter for immunization: Secondary | ICD-10-CM | POA: Diagnosis not present

## 2017-01-31 DIAGNOSIS — E1121 Type 2 diabetes mellitus with diabetic nephropathy: Secondary | ICD-10-CM | POA: Diagnosis not present

## 2017-01-31 DIAGNOSIS — D631 Anemia in chronic kidney disease: Secondary | ICD-10-CM | POA: Diagnosis not present

## 2017-01-31 DIAGNOSIS — N186 End stage renal disease: Secondary | ICD-10-CM | POA: Diagnosis not present

## 2017-01-31 DIAGNOSIS — N2581 Secondary hyperparathyroidism of renal origin: Secondary | ICD-10-CM | POA: Diagnosis not present

## 2017-02-03 DIAGNOSIS — Z23 Encounter for immunization: Secondary | ICD-10-CM | POA: Diagnosis not present

## 2017-02-03 DIAGNOSIS — N2581 Secondary hyperparathyroidism of renal origin: Secondary | ICD-10-CM | POA: Diagnosis not present

## 2017-02-03 DIAGNOSIS — D631 Anemia in chronic kidney disease: Secondary | ICD-10-CM | POA: Diagnosis not present

## 2017-02-03 DIAGNOSIS — N186 End stage renal disease: Secondary | ICD-10-CM | POA: Diagnosis not present

## 2017-02-03 DIAGNOSIS — E1121 Type 2 diabetes mellitus with diabetic nephropathy: Secondary | ICD-10-CM | POA: Diagnosis not present

## 2017-02-05 DIAGNOSIS — N2581 Secondary hyperparathyroidism of renal origin: Secondary | ICD-10-CM | POA: Diagnosis not present

## 2017-02-05 DIAGNOSIS — D631 Anemia in chronic kidney disease: Secondary | ICD-10-CM | POA: Diagnosis not present

## 2017-02-05 DIAGNOSIS — E1121 Type 2 diabetes mellitus with diabetic nephropathy: Secondary | ICD-10-CM | POA: Diagnosis not present

## 2017-02-05 DIAGNOSIS — N186 End stage renal disease: Secondary | ICD-10-CM | POA: Diagnosis not present

## 2017-02-05 DIAGNOSIS — Z23 Encounter for immunization: Secondary | ICD-10-CM | POA: Diagnosis not present

## 2017-02-07 DIAGNOSIS — E1121 Type 2 diabetes mellitus with diabetic nephropathy: Secondary | ICD-10-CM | POA: Diagnosis not present

## 2017-02-07 DIAGNOSIS — D631 Anemia in chronic kidney disease: Secondary | ICD-10-CM | POA: Diagnosis not present

## 2017-02-07 DIAGNOSIS — N2581 Secondary hyperparathyroidism of renal origin: Secondary | ICD-10-CM | POA: Diagnosis not present

## 2017-02-07 DIAGNOSIS — N186 End stage renal disease: Secondary | ICD-10-CM | POA: Diagnosis not present

## 2017-02-07 DIAGNOSIS — Z23 Encounter for immunization: Secondary | ICD-10-CM | POA: Diagnosis not present

## 2017-02-10 DIAGNOSIS — N186 End stage renal disease: Secondary | ICD-10-CM | POA: Diagnosis not present

## 2017-02-10 DIAGNOSIS — N2581 Secondary hyperparathyroidism of renal origin: Secondary | ICD-10-CM | POA: Diagnosis not present

## 2017-02-10 DIAGNOSIS — Z23 Encounter for immunization: Secondary | ICD-10-CM | POA: Diagnosis not present

## 2017-02-10 DIAGNOSIS — D631 Anemia in chronic kidney disease: Secondary | ICD-10-CM | POA: Diagnosis not present

## 2017-02-10 DIAGNOSIS — E1121 Type 2 diabetes mellitus with diabetic nephropathy: Secondary | ICD-10-CM | POA: Diagnosis not present

## 2017-02-12 DIAGNOSIS — N186 End stage renal disease: Secondary | ICD-10-CM | POA: Diagnosis not present

## 2017-02-12 DIAGNOSIS — E1121 Type 2 diabetes mellitus with diabetic nephropathy: Secondary | ICD-10-CM | POA: Diagnosis not present

## 2017-02-12 DIAGNOSIS — D631 Anemia in chronic kidney disease: Secondary | ICD-10-CM | POA: Diagnosis not present

## 2017-02-12 DIAGNOSIS — Z23 Encounter for immunization: Secondary | ICD-10-CM | POA: Diagnosis not present

## 2017-02-12 DIAGNOSIS — N2581 Secondary hyperparathyroidism of renal origin: Secondary | ICD-10-CM | POA: Diagnosis not present

## 2017-02-14 DIAGNOSIS — E1122 Type 2 diabetes mellitus with diabetic chronic kidney disease: Secondary | ICD-10-CM | POA: Diagnosis not present

## 2017-02-14 DIAGNOSIS — N2581 Secondary hyperparathyroidism of renal origin: Secondary | ICD-10-CM | POA: Diagnosis not present

## 2017-02-14 DIAGNOSIS — N186 End stage renal disease: Secondary | ICD-10-CM | POA: Diagnosis not present

## 2017-02-14 DIAGNOSIS — Z992 Dependence on renal dialysis: Secondary | ICD-10-CM | POA: Diagnosis not present

## 2017-02-14 DIAGNOSIS — D631 Anemia in chronic kidney disease: Secondary | ICD-10-CM | POA: Diagnosis not present

## 2017-02-14 DIAGNOSIS — Z23 Encounter for immunization: Secondary | ICD-10-CM | POA: Diagnosis not present

## 2017-02-14 DIAGNOSIS — E1121 Type 2 diabetes mellitus with diabetic nephropathy: Secondary | ICD-10-CM | POA: Diagnosis not present

## 2017-02-17 DIAGNOSIS — D631 Anemia in chronic kidney disease: Secondary | ICD-10-CM | POA: Diagnosis not present

## 2017-02-17 DIAGNOSIS — N186 End stage renal disease: Secondary | ICD-10-CM | POA: Diagnosis not present

## 2017-02-17 DIAGNOSIS — N2581 Secondary hyperparathyroidism of renal origin: Secondary | ICD-10-CM | POA: Diagnosis not present

## 2017-02-17 DIAGNOSIS — E1121 Type 2 diabetes mellitus with diabetic nephropathy: Secondary | ICD-10-CM | POA: Diagnosis not present

## 2017-02-19 DIAGNOSIS — N2581 Secondary hyperparathyroidism of renal origin: Secondary | ICD-10-CM | POA: Diagnosis not present

## 2017-02-19 DIAGNOSIS — N186 End stage renal disease: Secondary | ICD-10-CM | POA: Diagnosis not present

## 2017-02-19 DIAGNOSIS — E1121 Type 2 diabetes mellitus with diabetic nephropathy: Secondary | ICD-10-CM | POA: Diagnosis not present

## 2017-02-19 DIAGNOSIS — D631 Anemia in chronic kidney disease: Secondary | ICD-10-CM | POA: Diagnosis not present

## 2017-02-21 DIAGNOSIS — E1121 Type 2 diabetes mellitus with diabetic nephropathy: Secondary | ICD-10-CM | POA: Diagnosis not present

## 2017-02-21 DIAGNOSIS — N186 End stage renal disease: Secondary | ICD-10-CM | POA: Diagnosis not present

## 2017-02-21 DIAGNOSIS — D631 Anemia in chronic kidney disease: Secondary | ICD-10-CM | POA: Diagnosis not present

## 2017-02-21 DIAGNOSIS — N2581 Secondary hyperparathyroidism of renal origin: Secondary | ICD-10-CM | POA: Diagnosis not present

## 2017-02-24 DIAGNOSIS — N2581 Secondary hyperparathyroidism of renal origin: Secondary | ICD-10-CM | POA: Diagnosis not present

## 2017-02-24 DIAGNOSIS — N186 End stage renal disease: Secondary | ICD-10-CM | POA: Diagnosis not present

## 2017-02-24 DIAGNOSIS — D631 Anemia in chronic kidney disease: Secondary | ICD-10-CM | POA: Diagnosis not present

## 2017-02-24 DIAGNOSIS — E1121 Type 2 diabetes mellitus with diabetic nephropathy: Secondary | ICD-10-CM | POA: Diagnosis not present

## 2017-02-26 DIAGNOSIS — N2581 Secondary hyperparathyroidism of renal origin: Secondary | ICD-10-CM | POA: Diagnosis not present

## 2017-02-26 DIAGNOSIS — N186 End stage renal disease: Secondary | ICD-10-CM | POA: Diagnosis not present

## 2017-02-26 DIAGNOSIS — D631 Anemia in chronic kidney disease: Secondary | ICD-10-CM | POA: Diagnosis not present

## 2017-02-26 DIAGNOSIS — E1121 Type 2 diabetes mellitus with diabetic nephropathy: Secondary | ICD-10-CM | POA: Diagnosis not present

## 2017-02-28 DIAGNOSIS — E1121 Type 2 diabetes mellitus with diabetic nephropathy: Secondary | ICD-10-CM | POA: Diagnosis not present

## 2017-02-28 DIAGNOSIS — N186 End stage renal disease: Secondary | ICD-10-CM | POA: Diagnosis not present

## 2017-02-28 DIAGNOSIS — D631 Anemia in chronic kidney disease: Secondary | ICD-10-CM | POA: Diagnosis not present

## 2017-02-28 DIAGNOSIS — N2581 Secondary hyperparathyroidism of renal origin: Secondary | ICD-10-CM | POA: Diagnosis not present

## 2017-03-03 DIAGNOSIS — D631 Anemia in chronic kidney disease: Secondary | ICD-10-CM | POA: Diagnosis not present

## 2017-03-03 DIAGNOSIS — N2581 Secondary hyperparathyroidism of renal origin: Secondary | ICD-10-CM | POA: Diagnosis not present

## 2017-03-03 DIAGNOSIS — E1121 Type 2 diabetes mellitus with diabetic nephropathy: Secondary | ICD-10-CM | POA: Diagnosis not present

## 2017-03-03 DIAGNOSIS — N186 End stage renal disease: Secondary | ICD-10-CM | POA: Diagnosis not present

## 2017-03-05 DIAGNOSIS — E1121 Type 2 diabetes mellitus with diabetic nephropathy: Secondary | ICD-10-CM | POA: Diagnosis not present

## 2017-03-05 DIAGNOSIS — N186 End stage renal disease: Secondary | ICD-10-CM | POA: Diagnosis not present

## 2017-03-05 DIAGNOSIS — N2581 Secondary hyperparathyroidism of renal origin: Secondary | ICD-10-CM | POA: Diagnosis not present

## 2017-03-05 DIAGNOSIS — D631 Anemia in chronic kidney disease: Secondary | ICD-10-CM | POA: Diagnosis not present

## 2017-03-07 DIAGNOSIS — N186 End stage renal disease: Secondary | ICD-10-CM | POA: Diagnosis not present

## 2017-03-07 DIAGNOSIS — D631 Anemia in chronic kidney disease: Secondary | ICD-10-CM | POA: Diagnosis not present

## 2017-03-07 DIAGNOSIS — E1121 Type 2 diabetes mellitus with diabetic nephropathy: Secondary | ICD-10-CM | POA: Diagnosis not present

## 2017-03-07 DIAGNOSIS — N2581 Secondary hyperparathyroidism of renal origin: Secondary | ICD-10-CM | POA: Diagnosis not present

## 2017-03-10 DIAGNOSIS — N186 End stage renal disease: Secondary | ICD-10-CM | POA: Diagnosis not present

## 2017-03-10 DIAGNOSIS — E1121 Type 2 diabetes mellitus with diabetic nephropathy: Secondary | ICD-10-CM | POA: Diagnosis not present

## 2017-03-10 DIAGNOSIS — D631 Anemia in chronic kidney disease: Secondary | ICD-10-CM | POA: Diagnosis not present

## 2017-03-10 DIAGNOSIS — N2581 Secondary hyperparathyroidism of renal origin: Secondary | ICD-10-CM | POA: Diagnosis not present

## 2017-03-12 DIAGNOSIS — N2581 Secondary hyperparathyroidism of renal origin: Secondary | ICD-10-CM | POA: Diagnosis not present

## 2017-03-12 DIAGNOSIS — D631 Anemia in chronic kidney disease: Secondary | ICD-10-CM | POA: Diagnosis not present

## 2017-03-12 DIAGNOSIS — N186 End stage renal disease: Secondary | ICD-10-CM | POA: Diagnosis not present

## 2017-03-12 DIAGNOSIS — E1121 Type 2 diabetes mellitus with diabetic nephropathy: Secondary | ICD-10-CM | POA: Diagnosis not present

## 2017-03-14 DIAGNOSIS — E1121 Type 2 diabetes mellitus with diabetic nephropathy: Secondary | ICD-10-CM | POA: Diagnosis not present

## 2017-03-14 DIAGNOSIS — N2581 Secondary hyperparathyroidism of renal origin: Secondary | ICD-10-CM | POA: Diagnosis not present

## 2017-03-14 DIAGNOSIS — N186 End stage renal disease: Secondary | ICD-10-CM | POA: Diagnosis not present

## 2017-03-14 DIAGNOSIS — D631 Anemia in chronic kidney disease: Secondary | ICD-10-CM | POA: Diagnosis not present

## 2017-03-16 DIAGNOSIS — N186 End stage renal disease: Secondary | ICD-10-CM | POA: Diagnosis not present

## 2017-03-16 DIAGNOSIS — E1122 Type 2 diabetes mellitus with diabetic chronic kidney disease: Secondary | ICD-10-CM | POA: Diagnosis not present

## 2017-03-16 DIAGNOSIS — Z992 Dependence on renal dialysis: Secondary | ICD-10-CM | POA: Diagnosis not present

## 2017-03-17 DIAGNOSIS — D631 Anemia in chronic kidney disease: Secondary | ICD-10-CM | POA: Diagnosis not present

## 2017-03-17 DIAGNOSIS — N2581 Secondary hyperparathyroidism of renal origin: Secondary | ICD-10-CM | POA: Diagnosis not present

## 2017-03-17 DIAGNOSIS — D509 Iron deficiency anemia, unspecified: Secondary | ICD-10-CM | POA: Diagnosis not present

## 2017-03-17 DIAGNOSIS — N186 End stage renal disease: Secondary | ICD-10-CM | POA: Diagnosis not present

## 2017-03-17 DIAGNOSIS — E1121 Type 2 diabetes mellitus with diabetic nephropathy: Secondary | ICD-10-CM | POA: Diagnosis not present

## 2017-03-18 ENCOUNTER — Telehealth: Payer: Self-pay | Admitting: *Deleted

## 2017-03-18 NOTE — Telephone Encounter (Signed)
Patient called stating he received automated called from [facility] regarding vaccines.  Pt stated he received his flu shot from dialysis clinic in August. Immunization record up dated. Derl Barrow, RN

## 2017-03-19 DIAGNOSIS — E1121 Type 2 diabetes mellitus with diabetic nephropathy: Secondary | ICD-10-CM | POA: Diagnosis not present

## 2017-03-19 DIAGNOSIS — N2581 Secondary hyperparathyroidism of renal origin: Secondary | ICD-10-CM | POA: Diagnosis not present

## 2017-03-19 DIAGNOSIS — D631 Anemia in chronic kidney disease: Secondary | ICD-10-CM | POA: Diagnosis not present

## 2017-03-19 DIAGNOSIS — N186 End stage renal disease: Secondary | ICD-10-CM | POA: Diagnosis not present

## 2017-03-19 DIAGNOSIS — D509 Iron deficiency anemia, unspecified: Secondary | ICD-10-CM | POA: Diagnosis not present

## 2017-03-21 DIAGNOSIS — D631 Anemia in chronic kidney disease: Secondary | ICD-10-CM | POA: Diagnosis not present

## 2017-03-21 DIAGNOSIS — E1121 Type 2 diabetes mellitus with diabetic nephropathy: Secondary | ICD-10-CM | POA: Diagnosis not present

## 2017-03-21 DIAGNOSIS — N186 End stage renal disease: Secondary | ICD-10-CM | POA: Diagnosis not present

## 2017-03-21 DIAGNOSIS — N2581 Secondary hyperparathyroidism of renal origin: Secondary | ICD-10-CM | POA: Diagnosis not present

## 2017-03-21 DIAGNOSIS — D509 Iron deficiency anemia, unspecified: Secondary | ICD-10-CM | POA: Diagnosis not present

## 2017-03-24 DIAGNOSIS — N2581 Secondary hyperparathyroidism of renal origin: Secondary | ICD-10-CM | POA: Diagnosis not present

## 2017-03-24 DIAGNOSIS — D631 Anemia in chronic kidney disease: Secondary | ICD-10-CM | POA: Diagnosis not present

## 2017-03-24 DIAGNOSIS — E1121 Type 2 diabetes mellitus with diabetic nephropathy: Secondary | ICD-10-CM | POA: Diagnosis not present

## 2017-03-24 DIAGNOSIS — N186 End stage renal disease: Secondary | ICD-10-CM | POA: Diagnosis not present

## 2017-03-24 DIAGNOSIS — D509 Iron deficiency anemia, unspecified: Secondary | ICD-10-CM | POA: Diagnosis not present

## 2017-03-25 ENCOUNTER — Encounter: Payer: Self-pay | Admitting: Family Medicine

## 2017-03-26 ENCOUNTER — Encounter: Payer: Self-pay | Admitting: Family Medicine

## 2017-03-26 ENCOUNTER — Ambulatory Visit (INDEPENDENT_AMBULATORY_CARE_PROVIDER_SITE_OTHER): Payer: Medicare Other | Admitting: Family Medicine

## 2017-03-26 VITALS — BP 148/68 | HR 90 | Temp 98.3°F | Ht 71.75 in | Wt 260.0 lb

## 2017-03-26 DIAGNOSIS — Z23 Encounter for immunization: Secondary | ICD-10-CM | POA: Diagnosis present

## 2017-03-26 DIAGNOSIS — R1031 Right lower quadrant pain: Secondary | ICD-10-CM | POA: Diagnosis not present

## 2017-03-26 DIAGNOSIS — I10 Essential (primary) hypertension: Secondary | ICD-10-CM | POA: Diagnosis not present

## 2017-03-26 DIAGNOSIS — E1121 Type 2 diabetes mellitus with diabetic nephropathy: Secondary | ICD-10-CM | POA: Diagnosis not present

## 2017-03-26 DIAGNOSIS — D631 Anemia in chronic kidney disease: Secondary | ICD-10-CM | POA: Diagnosis not present

## 2017-03-26 DIAGNOSIS — N186 End stage renal disease: Secondary | ICD-10-CM | POA: Diagnosis not present

## 2017-03-26 DIAGNOSIS — D509 Iron deficiency anemia, unspecified: Secondary | ICD-10-CM | POA: Diagnosis not present

## 2017-03-26 DIAGNOSIS — N2581 Secondary hyperparathyroidism of renal origin: Secondary | ICD-10-CM | POA: Diagnosis not present

## 2017-03-26 MED ORDER — TETANUS-DIPHTH-ACELL PERTUSSIS 5-2.5-18.5 LF-MCG/0.5 IM SUSP: 0.5000 mL | Freq: Once | INTRAMUSCULAR | 0 refills | Status: AC

## 2017-03-26 NOTE — Assessment & Plan Note (Signed)
Somewhat controlled on current regimen of amlodipine 10mg , coreg 25mg , torsemide 100mg , minoxidil 10mg . Has HD MWF, had dialysis this morning. 148/68 today. Compliant with medications, exercising regularly. Knows he needs to have better compliance with renal diet. - continue current management.

## 2017-03-26 NOTE — Assessment & Plan Note (Signed)
Symptoms now resolved. No evidence of fracture, no contusions. No history of trauma. Story consistent with muscular strain of adductor muscle. Gait normal, strength and ROM fully intact. Instructed to take Tylenol for pain and to be reevaluated if happens again.

## 2017-03-26 NOTE — Progress Notes (Signed)
   Subjective:   Patient ID: Troy Wells    DOB: 1965-09-17, 51 y.o. male   MRN: [id number]  Troy Wells is a 51 y.o. male with a history of HTN, HLD, gout, CKD on HD, CHF, diabetes here for   Hypertension: - Medications: amlodipine 10mg , coreg 25mg , torsemide 100mg , minoxidil 10mg  - Compliance: yes - Checking BP at home: not regularly but gets checked at dialysis - Denies any SOB, CP, vision changes, LE edema, medication SEs, or symptoms of hypotension - Diet: renal diet (broccoli, avoid starches, no fried/processed meats), sometimes cheats - Exercise: gym (treadmill, bike, elliptical, stair steps) 3x per week, yard work - HD MWF, had dialysis this morning  Healthcare Maintenance - Vaccines: tetanus - Colonoscopy: 12/2015 - A1c: 5.3 11/2016 - Lipid Panel: 08/2015, Lipitor 40mg  - eye exam 2-3 months ago - dental exam 2-3 months ago  Groin Pain - Leg gave out when getting out of truck at the gym yesterday. Wasn't bothering him too bad so he worked out, didn't bother him again until he turned to step off of the treadmill and R leg gave out again and had R groin pain. - When he got home, he took some leftover prednisone that he takes for gout. - He woke up this morning with no pain. - Denies any pain today. No locking or catching.  - No trouble voiding or stooling, no belly pain.  Review of Systems:  Per HPI.   [place]: reviewed. Smoking status reviewed. Medications reviewed.  Objective:   BP (!) 148/68   Pulse 90   Temp 98.3 F (36.8 C) (Oral)   Ht 5' 11.75" (1.822 m)   Wt 260 lb (117.9 kg)   SpO2 93%   BMI 35.51 kg/m  Vitals and nursing note reviewed.  General: well nourished, well developed, in no acute distress with non-toxic appearance HEENT: normocephalic, atraumatic CV: regular rate and rhythm, systolic murmur Lungs: clear to auscultation bilaterally with normal work of breathing Skin: warm, dry, no rashes or lesions Extremities: warm and well perfused,  normal tone.  MSK: Full ROM, strength 5/5 LE, gait normal.  Neuro: Alert and oriented, speech normal  Assessment & Plan:   Essential hypertension Somewhat controlled on current regimen of amlodipine 10mg , coreg 25mg , torsemide 100mg , minoxidil 10mg . Has HD MWF, had dialysis this morning. 148/68 today. Compliant with medications, exercising regularly. Knows he needs to have better compliance with renal diet. - continue current management.  Groin pain, right Symptoms now resolved. No evidence of fracture, no contusions. No history of trauma. Story consistent with muscular strain of adductor muscle. Gait normal, strength and ROM fully intact. Instructed to take Tylenol for pain and to be reevaluated if happens again.  No orders of the defined types were placed in this encounter.  Meds ordered this encounter  Medications  . Tdap (BOOSTRIX) 5-2.5-18.5 LF-MCG/0.5 injection    Sig: Inject 0.5 mLs into the muscle once.    Dispense:  0.5 mL    Refill:  0    Rory Percy, DO PGY-1, [facility] 03/26/2017 3:18 PM

## 2017-03-26 NOTE — Patient Instructions (Signed)
It was great to see you!  For your groin pain,  - I'm glad you haven't had any more pain! - If this happens again, you should be reevaluated. You can take Tylenol for pain relief.  For your blood pressure, - It is controlled on your current regimen, no need for medication changes. - Try to stick to the renal diet for better control of your blood pressure and overall health.  Take care and seek immediate care sooner if you develop any concerns.   Rory Percy, DO [facility]   Adductor Muscle Strain An adductor muscle strain, also called a groin strain or pull, is an injury to the muscles or tendon on the upper inner part of the thigh. These muscles are called the adductor muscles or groin muscles. They are responsible for moving the leg across the body or pulling the legs together. A muscle strain occurs when a muscle is overstretched and some muscle fibers are torn. An adductor muscle strain can range from mild to severe, depending on how many muscle fibers are affected and whether the muscle fibers are partially or completely torn. Adductor muscle strains usually occur during exercise or participation in sports. The injury often happens when a sudden, violent force is placed on a muscle, stretching the muscle too far. A strain is more likely to occur when your muscles are not warmed up or if you are not properly conditioned. Depending on the severity of the muscle strain, recovery time may vary from a few weeks to several weeks. Severe injuries often require 4-6 weeks for recovery. In those cases, complete healing can take 4-5 months. What are the causes? This injury may be caused by:  Stretching the adductor muscles too far or too suddenly, often during side-to-side motion with an abrupt change in direction.  Putting repeated stress on the adductor muscles over a long period of time.  Performing vigorous activity without properly stretching the adductor muscles  beforehand.  What are the signs or symptoms? Symptoms of this condition include:  Pain and tenderness in the groin area. This begins as sharp pain and persists as a dull ache.  A popping or snapping feeling when the injury occurs (for severe strains).  Swelling or bruising.  Muscle spasms.  Weakness in the leg.  Stiffness in the groin area with decreased ability to move the affected muscles.  How is this diagnosed? This condition may be diagnosed based on your symptoms, your description of how the injury occurred, and a physical exam. X-rays are sometimes needed to rule out a broken bone or cartilage problems. A CT scan or MRI may be done if your health care provider suspects a complete muscle tear or needs to check for other injuries. How is this treated? An adductor strain will often heal on its own. Typically, treatment will involve protecting the injured area, rest, ice, pressure (compression), and elevation. This is often called PRICE therapy. Your health care provider may also prescribe medicines to help manage pain and swelling (anti-inflammatory medicine). You may be told to use crutches for the first few days to minimize your pain. Follow these instructions at home: [place] the muscle from being injured again.  Rest. Do not use the strained muscle if it causes pain.  If directed, apply ice to the injured area: ? Put ice in a plastic bag. ? Place a towel between your skin and the bag. ? Leave the ice on for 20 minutes, 2-3 times a day.  Do this for the first 2 days after the injury.  Apply compression by wrapping the injured area with an elastic bandage as told by your health care provider.  Raise (elevate) the injured area above the level of your heart while you are sitting or lying down. General instructions  Take over-the-counter and prescription medicines only as told by your health care provider.  Walk, stretch, and do exercises as told by your  health care provider. Only do these activities if you can do so without any pain. How is this prevented?  Warm up and stretch before being active.  Cool down and stretch after being active.  Give your body time to rest between periods of activity.  Make sure to use equipment that fits you.  Be safe and responsible while being active to avoid slips and falls.  Maintain physical fitness, including: ? Proper conditioning in the adductor muscles. ? Overall strength, flexibility, and endurance. Contact a health care provider if:  You have increased pain or swelling in the affected area.  Your symptoms are not improving or they are getting worse. This information is not intended to replace advice given to you by your health care provider. Make sure you discuss any questions you have with your health care provider. Document Released: 01/30/2004 Document Revised: 12/22/2015 Document Reviewed: 03/07/2015 Elsevier Interactive Patient Education  2018 [employer].

## 2017-03-28 DIAGNOSIS — N2581 Secondary hyperparathyroidism of renal origin: Secondary | ICD-10-CM | POA: Diagnosis not present

## 2017-03-28 DIAGNOSIS — D509 Iron deficiency anemia, unspecified: Secondary | ICD-10-CM | POA: Diagnosis not present

## 2017-03-28 DIAGNOSIS — D631 Anemia in chronic kidney disease: Secondary | ICD-10-CM | POA: Diagnosis not present

## 2017-03-28 DIAGNOSIS — E1121 Type 2 diabetes mellitus with diabetic nephropathy: Secondary | ICD-10-CM | POA: Diagnosis not present

## 2017-03-28 DIAGNOSIS — N186 End stage renal disease: Secondary | ICD-10-CM | POA: Diagnosis not present

## 2017-03-31 DIAGNOSIS — D509 Iron deficiency anemia, unspecified: Secondary | ICD-10-CM | POA: Diagnosis not present

## 2017-03-31 DIAGNOSIS — N186 End stage renal disease: Secondary | ICD-10-CM | POA: Diagnosis not present

## 2017-03-31 DIAGNOSIS — E1121 Type 2 diabetes mellitus with diabetic nephropathy: Secondary | ICD-10-CM | POA: Diagnosis not present

## 2017-03-31 DIAGNOSIS — N2581 Secondary hyperparathyroidism of renal origin: Secondary | ICD-10-CM | POA: Diagnosis not present

## 2017-03-31 DIAGNOSIS — D631 Anemia in chronic kidney disease: Secondary | ICD-10-CM | POA: Diagnosis not present

## 2017-04-02 DIAGNOSIS — D631 Anemia in chronic kidney disease: Secondary | ICD-10-CM | POA: Diagnosis not present

## 2017-04-02 DIAGNOSIS — E1121 Type 2 diabetes mellitus with diabetic nephropathy: Secondary | ICD-10-CM | POA: Diagnosis not present

## 2017-04-02 DIAGNOSIS — D509 Iron deficiency anemia, unspecified: Secondary | ICD-10-CM | POA: Diagnosis not present

## 2017-04-02 DIAGNOSIS — N2581 Secondary hyperparathyroidism of renal origin: Secondary | ICD-10-CM | POA: Diagnosis not present

## 2017-04-02 DIAGNOSIS — N186 End stage renal disease: Secondary | ICD-10-CM | POA: Diagnosis not present

## 2017-04-04 DIAGNOSIS — D509 Iron deficiency anemia, unspecified: Secondary | ICD-10-CM | POA: Diagnosis not present

## 2017-04-04 DIAGNOSIS — N2581 Secondary hyperparathyroidism of renal origin: Secondary | ICD-10-CM | POA: Diagnosis not present

## 2017-04-04 DIAGNOSIS — N186 End stage renal disease: Secondary | ICD-10-CM | POA: Diagnosis not present

## 2017-04-04 DIAGNOSIS — D631 Anemia in chronic kidney disease: Secondary | ICD-10-CM | POA: Diagnosis not present

## 2017-04-04 DIAGNOSIS — E1121 Type 2 diabetes mellitus with diabetic nephropathy: Secondary | ICD-10-CM | POA: Diagnosis not present

## 2017-04-07 DIAGNOSIS — N2581 Secondary hyperparathyroidism of renal origin: Secondary | ICD-10-CM | POA: Diagnosis not present

## 2017-04-07 DIAGNOSIS — N186 End stage renal disease: Secondary | ICD-10-CM | POA: Diagnosis not present

## 2017-04-07 DIAGNOSIS — E1121 Type 2 diabetes mellitus with diabetic nephropathy: Secondary | ICD-10-CM | POA: Diagnosis not present

## 2017-04-07 DIAGNOSIS — D631 Anemia in chronic kidney disease: Secondary | ICD-10-CM | POA: Diagnosis not present

## 2017-04-07 DIAGNOSIS — D509 Iron deficiency anemia, unspecified: Secondary | ICD-10-CM | POA: Diagnosis not present

## 2017-04-09 DIAGNOSIS — N2581 Secondary hyperparathyroidism of renal origin: Secondary | ICD-10-CM | POA: Diagnosis not present

## 2017-04-09 DIAGNOSIS — N186 End stage renal disease: Secondary | ICD-10-CM | POA: Diagnosis not present

## 2017-04-09 DIAGNOSIS — D509 Iron deficiency anemia, unspecified: Secondary | ICD-10-CM | POA: Diagnosis not present

## 2017-04-09 DIAGNOSIS — D631 Anemia in chronic kidney disease: Secondary | ICD-10-CM | POA: Diagnosis not present

## 2017-04-09 DIAGNOSIS — E1121 Type 2 diabetes mellitus with diabetic nephropathy: Secondary | ICD-10-CM | POA: Diagnosis not present

## 2017-04-11 DIAGNOSIS — D631 Anemia in chronic kidney disease: Secondary | ICD-10-CM | POA: Diagnosis not present

## 2017-04-11 DIAGNOSIS — D509 Iron deficiency anemia, unspecified: Secondary | ICD-10-CM | POA: Diagnosis not present

## 2017-04-11 DIAGNOSIS — E1121 Type 2 diabetes mellitus with diabetic nephropathy: Secondary | ICD-10-CM | POA: Diagnosis not present

## 2017-04-11 DIAGNOSIS — N2581 Secondary hyperparathyroidism of renal origin: Secondary | ICD-10-CM | POA: Diagnosis not present

## 2017-04-11 DIAGNOSIS — N186 End stage renal disease: Secondary | ICD-10-CM | POA: Diagnosis not present

## 2017-04-14 DIAGNOSIS — N2581 Secondary hyperparathyroidism of renal origin: Secondary | ICD-10-CM | POA: Diagnosis not present

## 2017-04-14 DIAGNOSIS — D509 Iron deficiency anemia, unspecified: Secondary | ICD-10-CM | POA: Diagnosis not present

## 2017-04-14 DIAGNOSIS — D631 Anemia in chronic kidney disease: Secondary | ICD-10-CM | POA: Diagnosis not present

## 2017-04-14 DIAGNOSIS — N186 End stage renal disease: Secondary | ICD-10-CM | POA: Diagnosis not present

## 2017-04-14 DIAGNOSIS — E1121 Type 2 diabetes mellitus with diabetic nephropathy: Secondary | ICD-10-CM | POA: Diagnosis not present

## 2017-04-16 DIAGNOSIS — E1121 Type 2 diabetes mellitus with diabetic nephropathy: Secondary | ICD-10-CM | POA: Diagnosis not present

## 2017-04-16 DIAGNOSIS — D631 Anemia in chronic kidney disease: Secondary | ICD-10-CM | POA: Diagnosis not present

## 2017-04-16 DIAGNOSIS — D509 Iron deficiency anemia, unspecified: Secondary | ICD-10-CM | POA: Diagnosis not present

## 2017-04-16 DIAGNOSIS — N186 End stage renal disease: Secondary | ICD-10-CM | POA: Diagnosis not present

## 2017-04-16 DIAGNOSIS — E1122 Type 2 diabetes mellitus with diabetic chronic kidney disease: Secondary | ICD-10-CM | POA: Diagnosis not present

## 2017-04-16 DIAGNOSIS — Z992 Dependence on renal dialysis: Secondary | ICD-10-CM | POA: Diagnosis not present

## 2017-04-16 DIAGNOSIS — N2581 Secondary hyperparathyroidism of renal origin: Secondary | ICD-10-CM | POA: Diagnosis not present

## 2017-04-18 DIAGNOSIS — D509 Iron deficiency anemia, unspecified: Secondary | ICD-10-CM | POA: Diagnosis not present

## 2017-04-18 DIAGNOSIS — Z23 Encounter for immunization: Secondary | ICD-10-CM | POA: Diagnosis not present

## 2017-04-18 DIAGNOSIS — N186 End stage renal disease: Secondary | ICD-10-CM | POA: Diagnosis not present

## 2017-04-18 DIAGNOSIS — D631 Anemia in chronic kidney disease: Secondary | ICD-10-CM | POA: Diagnosis not present

## 2017-04-18 DIAGNOSIS — N2581 Secondary hyperparathyroidism of renal origin: Secondary | ICD-10-CM | POA: Diagnosis not present

## 2017-04-18 DIAGNOSIS — E1121 Type 2 diabetes mellitus with diabetic nephropathy: Secondary | ICD-10-CM | POA: Diagnosis not present

## 2017-04-21 DIAGNOSIS — D631 Anemia in chronic kidney disease: Secondary | ICD-10-CM | POA: Diagnosis not present

## 2017-04-21 DIAGNOSIS — E1121 Type 2 diabetes mellitus with diabetic nephropathy: Secondary | ICD-10-CM | POA: Diagnosis not present

## 2017-04-21 DIAGNOSIS — N2581 Secondary hyperparathyroidism of renal origin: Secondary | ICD-10-CM | POA: Diagnosis not present

## 2017-04-21 DIAGNOSIS — N186 End stage renal disease: Secondary | ICD-10-CM | POA: Diagnosis not present

## 2017-04-21 DIAGNOSIS — Z23 Encounter for immunization: Secondary | ICD-10-CM | POA: Diagnosis not present

## 2017-04-21 DIAGNOSIS — D509 Iron deficiency anemia, unspecified: Secondary | ICD-10-CM | POA: Diagnosis not present

## 2017-04-23 DIAGNOSIS — D631 Anemia in chronic kidney disease: Secondary | ICD-10-CM | POA: Diagnosis not present

## 2017-04-23 DIAGNOSIS — E1121 Type 2 diabetes mellitus with diabetic nephropathy: Secondary | ICD-10-CM | POA: Diagnosis not present

## 2017-04-23 DIAGNOSIS — Z23 Encounter for immunization: Secondary | ICD-10-CM | POA: Diagnosis not present

## 2017-04-23 DIAGNOSIS — D509 Iron deficiency anemia, unspecified: Secondary | ICD-10-CM | POA: Diagnosis not present

## 2017-04-23 DIAGNOSIS — N186 End stage renal disease: Secondary | ICD-10-CM | POA: Diagnosis not present

## 2017-04-23 DIAGNOSIS — N2581 Secondary hyperparathyroidism of renal origin: Secondary | ICD-10-CM | POA: Diagnosis not present

## 2017-04-25 DIAGNOSIS — N186 End stage renal disease: Secondary | ICD-10-CM | POA: Diagnosis not present

## 2017-04-25 DIAGNOSIS — N2581 Secondary hyperparathyroidism of renal origin: Secondary | ICD-10-CM | POA: Diagnosis not present

## 2017-04-25 DIAGNOSIS — D509 Iron deficiency anemia, unspecified: Secondary | ICD-10-CM | POA: Diagnosis not present

## 2017-04-25 DIAGNOSIS — D631 Anemia in chronic kidney disease: Secondary | ICD-10-CM | POA: Diagnosis not present

## 2017-04-25 DIAGNOSIS — E1121 Type 2 diabetes mellitus with diabetic nephropathy: Secondary | ICD-10-CM | POA: Diagnosis not present

## 2017-04-25 DIAGNOSIS — Z23 Encounter for immunization: Secondary | ICD-10-CM | POA: Diagnosis not present

## 2017-04-28 DIAGNOSIS — D509 Iron deficiency anemia, unspecified: Secondary | ICD-10-CM | POA: Diagnosis not present

## 2017-04-28 DIAGNOSIS — D631 Anemia in chronic kidney disease: Secondary | ICD-10-CM | POA: Diagnosis not present

## 2017-04-28 DIAGNOSIS — N186 End stage renal disease: Secondary | ICD-10-CM | POA: Diagnosis not present

## 2017-04-28 DIAGNOSIS — Z23 Encounter for immunization: Secondary | ICD-10-CM | POA: Diagnosis not present

## 2017-04-28 DIAGNOSIS — N2581 Secondary hyperparathyroidism of renal origin: Secondary | ICD-10-CM | POA: Diagnosis not present

## 2017-04-28 DIAGNOSIS — E1121 Type 2 diabetes mellitus with diabetic nephropathy: Secondary | ICD-10-CM | POA: Diagnosis not present

## 2017-04-30 DIAGNOSIS — Z23 Encounter for immunization: Secondary | ICD-10-CM | POA: Diagnosis not present

## 2017-04-30 DIAGNOSIS — N186 End stage renal disease: Secondary | ICD-10-CM | POA: Diagnosis not present

## 2017-04-30 DIAGNOSIS — D509 Iron deficiency anemia, unspecified: Secondary | ICD-10-CM | POA: Diagnosis not present

## 2017-04-30 DIAGNOSIS — N2581 Secondary hyperparathyroidism of renal origin: Secondary | ICD-10-CM | POA: Diagnosis not present

## 2017-04-30 DIAGNOSIS — E1121 Type 2 diabetes mellitus with diabetic nephropathy: Secondary | ICD-10-CM | POA: Diagnosis not present

## 2017-04-30 DIAGNOSIS — D631 Anemia in chronic kidney disease: Secondary | ICD-10-CM | POA: Diagnosis not present

## 2017-05-02 DIAGNOSIS — N2581 Secondary hyperparathyroidism of renal origin: Secondary | ICD-10-CM | POA: Diagnosis not present

## 2017-05-02 DIAGNOSIS — E1121 Type 2 diabetes mellitus with diabetic nephropathy: Secondary | ICD-10-CM | POA: Diagnosis not present

## 2017-05-02 DIAGNOSIS — Z23 Encounter for immunization: Secondary | ICD-10-CM | POA: Diagnosis not present

## 2017-05-02 DIAGNOSIS — N186 End stage renal disease: Secondary | ICD-10-CM | POA: Diagnosis not present

## 2017-05-02 DIAGNOSIS — D509 Iron deficiency anemia, unspecified: Secondary | ICD-10-CM | POA: Diagnosis not present

## 2017-05-02 DIAGNOSIS — D631 Anemia in chronic kidney disease: Secondary | ICD-10-CM | POA: Diagnosis not present

## 2017-05-04 DIAGNOSIS — E1121 Type 2 diabetes mellitus with diabetic nephropathy: Secondary | ICD-10-CM | POA: Diagnosis not present

## 2017-05-04 DIAGNOSIS — D631 Anemia in chronic kidney disease: Secondary | ICD-10-CM | POA: Diagnosis not present

## 2017-05-04 DIAGNOSIS — Z23 Encounter for immunization: Secondary | ICD-10-CM | POA: Diagnosis not present

## 2017-05-04 DIAGNOSIS — N186 End stage renal disease: Secondary | ICD-10-CM | POA: Diagnosis not present

## 2017-05-04 DIAGNOSIS — D509 Iron deficiency anemia, unspecified: Secondary | ICD-10-CM | POA: Diagnosis not present

## 2017-05-04 DIAGNOSIS — N2581 Secondary hyperparathyroidism of renal origin: Secondary | ICD-10-CM | POA: Diagnosis not present

## 2017-05-06 DIAGNOSIS — D509 Iron deficiency anemia, unspecified: Secondary | ICD-10-CM | POA: Diagnosis not present

## 2017-05-06 DIAGNOSIS — N186 End stage renal disease: Secondary | ICD-10-CM | POA: Diagnosis not present

## 2017-05-06 DIAGNOSIS — N2581 Secondary hyperparathyroidism of renal origin: Secondary | ICD-10-CM | POA: Diagnosis not present

## 2017-05-06 DIAGNOSIS — Z23 Encounter for immunization: Secondary | ICD-10-CM | POA: Diagnosis not present

## 2017-05-06 DIAGNOSIS — D631 Anemia in chronic kidney disease: Secondary | ICD-10-CM | POA: Diagnosis not present

## 2017-05-06 DIAGNOSIS — E1121 Type 2 diabetes mellitus with diabetic nephropathy: Secondary | ICD-10-CM | POA: Diagnosis not present

## 2017-05-09 DIAGNOSIS — N186 End stage renal disease: Secondary | ICD-10-CM | POA: Diagnosis not present

## 2017-05-09 DIAGNOSIS — N2581 Secondary hyperparathyroidism of renal origin: Secondary | ICD-10-CM | POA: Diagnosis not present

## 2017-05-09 DIAGNOSIS — Z23 Encounter for immunization: Secondary | ICD-10-CM | POA: Diagnosis not present

## 2017-05-09 DIAGNOSIS — D631 Anemia in chronic kidney disease: Secondary | ICD-10-CM | POA: Diagnosis not present

## 2017-05-09 DIAGNOSIS — D509 Iron deficiency anemia, unspecified: Secondary | ICD-10-CM | POA: Diagnosis not present

## 2017-05-09 DIAGNOSIS — E1121 Type 2 diabetes mellitus with diabetic nephropathy: Secondary | ICD-10-CM | POA: Diagnosis not present

## 2017-05-12 DIAGNOSIS — D509 Iron deficiency anemia, unspecified: Secondary | ICD-10-CM | POA: Diagnosis not present

## 2017-05-12 DIAGNOSIS — Z23 Encounter for immunization: Secondary | ICD-10-CM | POA: Diagnosis not present

## 2017-05-12 DIAGNOSIS — E1121 Type 2 diabetes mellitus with diabetic nephropathy: Secondary | ICD-10-CM | POA: Diagnosis not present

## 2017-05-12 DIAGNOSIS — D631 Anemia in chronic kidney disease: Secondary | ICD-10-CM | POA: Diagnosis not present

## 2017-05-12 DIAGNOSIS — N186 End stage renal disease: Secondary | ICD-10-CM | POA: Diagnosis not present

## 2017-05-12 DIAGNOSIS — N2581 Secondary hyperparathyroidism of renal origin: Secondary | ICD-10-CM | POA: Diagnosis not present

## 2017-05-14 DIAGNOSIS — Z23 Encounter for immunization: Secondary | ICD-10-CM | POA: Diagnosis not present

## 2017-05-14 DIAGNOSIS — D631 Anemia in chronic kidney disease: Secondary | ICD-10-CM | POA: Diagnosis not present

## 2017-05-14 DIAGNOSIS — N2581 Secondary hyperparathyroidism of renal origin: Secondary | ICD-10-CM | POA: Diagnosis not present

## 2017-05-14 DIAGNOSIS — E1121 Type 2 diabetes mellitus with diabetic nephropathy: Secondary | ICD-10-CM | POA: Diagnosis not present

## 2017-05-14 DIAGNOSIS — D509 Iron deficiency anemia, unspecified: Secondary | ICD-10-CM | POA: Diagnosis not present

## 2017-05-14 DIAGNOSIS — N186 End stage renal disease: Secondary | ICD-10-CM | POA: Diagnosis not present

## 2017-05-16 DIAGNOSIS — N186 End stage renal disease: Secondary | ICD-10-CM | POA: Diagnosis not present

## 2017-05-16 DIAGNOSIS — Z23 Encounter for immunization: Secondary | ICD-10-CM | POA: Diagnosis not present

## 2017-05-16 DIAGNOSIS — N2581 Secondary hyperparathyroidism of renal origin: Secondary | ICD-10-CM | POA: Diagnosis not present

## 2017-05-16 DIAGNOSIS — D631 Anemia in chronic kidney disease: Secondary | ICD-10-CM | POA: Diagnosis not present

## 2017-05-16 DIAGNOSIS — D509 Iron deficiency anemia, unspecified: Secondary | ICD-10-CM | POA: Diagnosis not present

## 2017-05-16 DIAGNOSIS — E1122 Type 2 diabetes mellitus with diabetic chronic kidney disease: Secondary | ICD-10-CM | POA: Diagnosis not present

## 2017-05-16 DIAGNOSIS — Z992 Dependence on renal dialysis: Secondary | ICD-10-CM | POA: Diagnosis not present

## 2017-05-16 DIAGNOSIS — E1121 Type 2 diabetes mellitus with diabetic nephropathy: Secondary | ICD-10-CM | POA: Diagnosis not present

## 2017-05-19 DIAGNOSIS — N186 End stage renal disease: Secondary | ICD-10-CM | POA: Diagnosis not present

## 2017-05-19 DIAGNOSIS — N2581 Secondary hyperparathyroidism of renal origin: Secondary | ICD-10-CM | POA: Diagnosis not present

## 2017-05-19 DIAGNOSIS — E1121 Type 2 diabetes mellitus with diabetic nephropathy: Secondary | ICD-10-CM | POA: Diagnosis not present

## 2017-05-19 DIAGNOSIS — D631 Anemia in chronic kidney disease: Secondary | ICD-10-CM | POA: Diagnosis not present

## 2017-05-21 DIAGNOSIS — E1121 Type 2 diabetes mellitus with diabetic nephropathy: Secondary | ICD-10-CM | POA: Diagnosis not present

## 2017-05-21 DIAGNOSIS — D631 Anemia in chronic kidney disease: Secondary | ICD-10-CM | POA: Diagnosis not present

## 2017-05-21 DIAGNOSIS — N2581 Secondary hyperparathyroidism of renal origin: Secondary | ICD-10-CM | POA: Diagnosis not present

## 2017-05-21 DIAGNOSIS — N186 End stage renal disease: Secondary | ICD-10-CM | POA: Diagnosis not present

## 2017-05-23 DIAGNOSIS — N186 End stage renal disease: Secondary | ICD-10-CM | POA: Diagnosis not present

## 2017-05-23 DIAGNOSIS — D631 Anemia in chronic kidney disease: Secondary | ICD-10-CM | POA: Diagnosis not present

## 2017-05-23 DIAGNOSIS — N2581 Secondary hyperparathyroidism of renal origin: Secondary | ICD-10-CM | POA: Diagnosis not present

## 2017-05-23 DIAGNOSIS — E1121 Type 2 diabetes mellitus with diabetic nephropathy: Secondary | ICD-10-CM | POA: Diagnosis not present

## 2017-05-26 DIAGNOSIS — N186 End stage renal disease: Secondary | ICD-10-CM | POA: Diagnosis not present

## 2017-05-26 DIAGNOSIS — E1121 Type 2 diabetes mellitus with diabetic nephropathy: Secondary | ICD-10-CM | POA: Diagnosis not present

## 2017-05-26 DIAGNOSIS — N2581 Secondary hyperparathyroidism of renal origin: Secondary | ICD-10-CM | POA: Diagnosis not present

## 2017-05-26 DIAGNOSIS — D631 Anemia in chronic kidney disease: Secondary | ICD-10-CM | POA: Diagnosis not present

## 2017-05-28 DIAGNOSIS — N186 End stage renal disease: Secondary | ICD-10-CM | POA: Diagnosis not present

## 2017-05-28 DIAGNOSIS — N2581 Secondary hyperparathyroidism of renal origin: Secondary | ICD-10-CM | POA: Diagnosis not present

## 2017-05-28 DIAGNOSIS — D631 Anemia in chronic kidney disease: Secondary | ICD-10-CM | POA: Diagnosis not present

## 2017-05-28 DIAGNOSIS — E1121 Type 2 diabetes mellitus with diabetic nephropathy: Secondary | ICD-10-CM | POA: Diagnosis not present

## 2017-05-30 DIAGNOSIS — N2581 Secondary hyperparathyroidism of renal origin: Secondary | ICD-10-CM | POA: Diagnosis not present

## 2017-05-30 DIAGNOSIS — E1121 Type 2 diabetes mellitus with diabetic nephropathy: Secondary | ICD-10-CM | POA: Diagnosis not present

## 2017-05-30 DIAGNOSIS — N186 End stage renal disease: Secondary | ICD-10-CM | POA: Diagnosis not present

## 2017-05-30 DIAGNOSIS — D631 Anemia in chronic kidney disease: Secondary | ICD-10-CM | POA: Diagnosis not present

## 2017-06-02 DIAGNOSIS — D631 Anemia in chronic kidney disease: Secondary | ICD-10-CM | POA: Diagnosis not present

## 2017-06-02 DIAGNOSIS — E1121 Type 2 diabetes mellitus with diabetic nephropathy: Secondary | ICD-10-CM | POA: Diagnosis not present

## 2017-06-02 DIAGNOSIS — N186 End stage renal disease: Secondary | ICD-10-CM | POA: Diagnosis not present

## 2017-06-02 DIAGNOSIS — N2581 Secondary hyperparathyroidism of renal origin: Secondary | ICD-10-CM | POA: Diagnosis not present

## 2017-06-04 DIAGNOSIS — N2581 Secondary hyperparathyroidism of renal origin: Secondary | ICD-10-CM | POA: Diagnosis not present

## 2017-06-04 DIAGNOSIS — E1121 Type 2 diabetes mellitus with diabetic nephropathy: Secondary | ICD-10-CM | POA: Diagnosis not present

## 2017-06-04 DIAGNOSIS — N186 End stage renal disease: Secondary | ICD-10-CM | POA: Diagnosis not present

## 2017-06-04 DIAGNOSIS — D631 Anemia in chronic kidney disease: Secondary | ICD-10-CM | POA: Diagnosis not present

## 2017-06-06 DIAGNOSIS — D631 Anemia in chronic kidney disease: Secondary | ICD-10-CM | POA: Diagnosis not present

## 2017-06-06 DIAGNOSIS — E1121 Type 2 diabetes mellitus with diabetic nephropathy: Secondary | ICD-10-CM | POA: Diagnosis not present

## 2017-06-06 DIAGNOSIS — N186 End stage renal disease: Secondary | ICD-10-CM | POA: Diagnosis not present

## 2017-06-06 DIAGNOSIS — N2581 Secondary hyperparathyroidism of renal origin: Secondary | ICD-10-CM | POA: Diagnosis not present

## 2017-06-08 DIAGNOSIS — N2581 Secondary hyperparathyroidism of renal origin: Secondary | ICD-10-CM | POA: Diagnosis not present

## 2017-06-08 DIAGNOSIS — E1121 Type 2 diabetes mellitus with diabetic nephropathy: Secondary | ICD-10-CM | POA: Diagnosis not present

## 2017-06-08 DIAGNOSIS — N186 End stage renal disease: Secondary | ICD-10-CM | POA: Diagnosis not present

## 2017-06-08 DIAGNOSIS — D631 Anemia in chronic kidney disease: Secondary | ICD-10-CM | POA: Diagnosis not present

## 2017-06-11 DIAGNOSIS — N186 End stage renal disease: Secondary | ICD-10-CM | POA: Diagnosis not present

## 2017-06-11 DIAGNOSIS — D631 Anemia in chronic kidney disease: Secondary | ICD-10-CM | POA: Diagnosis not present

## 2017-06-11 DIAGNOSIS — E1121 Type 2 diabetes mellitus with diabetic nephropathy: Secondary | ICD-10-CM | POA: Diagnosis not present

## 2017-06-11 DIAGNOSIS — N2581 Secondary hyperparathyroidism of renal origin: Secondary | ICD-10-CM | POA: Diagnosis not present

## 2017-06-13 DIAGNOSIS — N186 End stage renal disease: Secondary | ICD-10-CM | POA: Diagnosis not present

## 2017-06-13 DIAGNOSIS — D631 Anemia in chronic kidney disease: Secondary | ICD-10-CM | POA: Diagnosis not present

## 2017-06-13 DIAGNOSIS — N2581 Secondary hyperparathyroidism of renal origin: Secondary | ICD-10-CM | POA: Diagnosis not present

## 2017-06-13 DIAGNOSIS — E1121 Type 2 diabetes mellitus with diabetic nephropathy: Secondary | ICD-10-CM | POA: Diagnosis not present

## 2017-06-15 DIAGNOSIS — N186 End stage renal disease: Secondary | ICD-10-CM | POA: Diagnosis not present

## 2017-06-15 DIAGNOSIS — N2581 Secondary hyperparathyroidism of renal origin: Secondary | ICD-10-CM | POA: Diagnosis not present

## 2017-06-15 DIAGNOSIS — D631 Anemia in chronic kidney disease: Secondary | ICD-10-CM | POA: Diagnosis not present

## 2017-06-15 DIAGNOSIS — E1121 Type 2 diabetes mellitus with diabetic nephropathy: Secondary | ICD-10-CM | POA: Diagnosis not present

## 2017-06-16 DIAGNOSIS — Z992 Dependence on renal dialysis: Secondary | ICD-10-CM | POA: Diagnosis not present

## 2017-06-16 DIAGNOSIS — N186 End stage renal disease: Secondary | ICD-10-CM | POA: Diagnosis not present

## 2017-06-16 DIAGNOSIS — E1122 Type 2 diabetes mellitus with diabetic chronic kidney disease: Secondary | ICD-10-CM | POA: Diagnosis not present

## 2017-06-18 DIAGNOSIS — E1121 Type 2 diabetes mellitus with diabetic nephropathy: Secondary | ICD-10-CM | POA: Diagnosis not present

## 2017-06-18 DIAGNOSIS — N186 End stage renal disease: Secondary | ICD-10-CM | POA: Diagnosis not present

## 2017-06-18 DIAGNOSIS — D631 Anemia in chronic kidney disease: Secondary | ICD-10-CM | POA: Diagnosis not present

## 2017-06-18 DIAGNOSIS — N2581 Secondary hyperparathyroidism of renal origin: Secondary | ICD-10-CM | POA: Diagnosis not present

## 2017-06-20 DIAGNOSIS — N2581 Secondary hyperparathyroidism of renal origin: Secondary | ICD-10-CM | POA: Diagnosis not present

## 2017-06-20 DIAGNOSIS — D631 Anemia in chronic kidney disease: Secondary | ICD-10-CM | POA: Diagnosis not present

## 2017-06-20 DIAGNOSIS — E1121 Type 2 diabetes mellitus with diabetic nephropathy: Secondary | ICD-10-CM | POA: Diagnosis not present

## 2017-06-20 DIAGNOSIS — N186 End stage renal disease: Secondary | ICD-10-CM | POA: Diagnosis not present

## 2017-06-23 DIAGNOSIS — D631 Anemia in chronic kidney disease: Secondary | ICD-10-CM | POA: Diagnosis not present

## 2017-06-23 DIAGNOSIS — E1121 Type 2 diabetes mellitus with diabetic nephropathy: Secondary | ICD-10-CM | POA: Diagnosis not present

## 2017-06-23 DIAGNOSIS — N186 End stage renal disease: Secondary | ICD-10-CM | POA: Diagnosis not present

## 2017-06-23 DIAGNOSIS — N2581 Secondary hyperparathyroidism of renal origin: Secondary | ICD-10-CM | POA: Diagnosis not present

## 2017-06-25 DIAGNOSIS — D631 Anemia in chronic kidney disease: Secondary | ICD-10-CM | POA: Diagnosis not present

## 2017-06-25 DIAGNOSIS — N2581 Secondary hyperparathyroidism of renal origin: Secondary | ICD-10-CM | POA: Diagnosis not present

## 2017-06-25 DIAGNOSIS — N186 End stage renal disease: Secondary | ICD-10-CM | POA: Diagnosis not present

## 2017-06-25 DIAGNOSIS — E1121 Type 2 diabetes mellitus with diabetic nephropathy: Secondary | ICD-10-CM | POA: Diagnosis not present

## 2017-06-27 DIAGNOSIS — D631 Anemia in chronic kidney disease: Secondary | ICD-10-CM | POA: Diagnosis not present

## 2017-06-27 DIAGNOSIS — N2581 Secondary hyperparathyroidism of renal origin: Secondary | ICD-10-CM | POA: Diagnosis not present

## 2017-06-27 DIAGNOSIS — E1121 Type 2 diabetes mellitus with diabetic nephropathy: Secondary | ICD-10-CM | POA: Diagnosis not present

## 2017-06-27 DIAGNOSIS — N186 End stage renal disease: Secondary | ICD-10-CM | POA: Diagnosis not present

## 2017-06-30 DIAGNOSIS — E1121 Type 2 diabetes mellitus with diabetic nephropathy: Secondary | ICD-10-CM | POA: Diagnosis not present

## 2017-06-30 DIAGNOSIS — N186 End stage renal disease: Secondary | ICD-10-CM | POA: Diagnosis not present

## 2017-06-30 DIAGNOSIS — N2581 Secondary hyperparathyroidism of renal origin: Secondary | ICD-10-CM | POA: Diagnosis not present

## 2017-06-30 DIAGNOSIS — D631 Anemia in chronic kidney disease: Secondary | ICD-10-CM | POA: Diagnosis not present

## 2017-07-02 DIAGNOSIS — D631 Anemia in chronic kidney disease: Secondary | ICD-10-CM | POA: Diagnosis not present

## 2017-07-02 DIAGNOSIS — N2581 Secondary hyperparathyroidism of renal origin: Secondary | ICD-10-CM | POA: Diagnosis not present

## 2017-07-02 DIAGNOSIS — E1121 Type 2 diabetes mellitus with diabetic nephropathy: Secondary | ICD-10-CM | POA: Diagnosis not present

## 2017-07-02 DIAGNOSIS — N186 End stage renal disease: Secondary | ICD-10-CM | POA: Diagnosis not present

## 2017-07-04 DIAGNOSIS — N2581 Secondary hyperparathyroidism of renal origin: Secondary | ICD-10-CM | POA: Diagnosis not present

## 2017-07-04 DIAGNOSIS — D631 Anemia in chronic kidney disease: Secondary | ICD-10-CM | POA: Diagnosis not present

## 2017-07-04 DIAGNOSIS — N186 End stage renal disease: Secondary | ICD-10-CM | POA: Diagnosis not present

## 2017-07-04 DIAGNOSIS — E1121 Type 2 diabetes mellitus with diabetic nephropathy: Secondary | ICD-10-CM | POA: Diagnosis not present

## 2017-07-07 DIAGNOSIS — N2581 Secondary hyperparathyroidism of renal origin: Secondary | ICD-10-CM | POA: Diagnosis not present

## 2017-07-07 DIAGNOSIS — E1121 Type 2 diabetes mellitus with diabetic nephropathy: Secondary | ICD-10-CM | POA: Diagnosis not present

## 2017-07-07 DIAGNOSIS — N186 End stage renal disease: Secondary | ICD-10-CM | POA: Diagnosis not present

## 2017-07-07 DIAGNOSIS — D631 Anemia in chronic kidney disease: Secondary | ICD-10-CM | POA: Diagnosis not present

## 2017-07-09 DIAGNOSIS — N2581 Secondary hyperparathyroidism of renal origin: Secondary | ICD-10-CM | POA: Diagnosis not present

## 2017-07-09 DIAGNOSIS — E1121 Type 2 diabetes mellitus with diabetic nephropathy: Secondary | ICD-10-CM | POA: Diagnosis not present

## 2017-07-09 DIAGNOSIS — D631 Anemia in chronic kidney disease: Secondary | ICD-10-CM | POA: Diagnosis not present

## 2017-07-09 DIAGNOSIS — N186 End stage renal disease: Secondary | ICD-10-CM | POA: Diagnosis not present

## 2017-07-11 DIAGNOSIS — N2581 Secondary hyperparathyroidism of renal origin: Secondary | ICD-10-CM | POA: Diagnosis not present

## 2017-07-11 DIAGNOSIS — N186 End stage renal disease: Secondary | ICD-10-CM | POA: Diagnosis not present

## 2017-07-11 DIAGNOSIS — D631 Anemia in chronic kidney disease: Secondary | ICD-10-CM | POA: Diagnosis not present

## 2017-07-11 DIAGNOSIS — E1121 Type 2 diabetes mellitus with diabetic nephropathy: Secondary | ICD-10-CM | POA: Diagnosis not present

## 2017-07-14 DIAGNOSIS — E1121 Type 2 diabetes mellitus with diabetic nephropathy: Secondary | ICD-10-CM | POA: Diagnosis not present

## 2017-07-14 DIAGNOSIS — N186 End stage renal disease: Secondary | ICD-10-CM | POA: Diagnosis not present

## 2017-07-14 DIAGNOSIS — N2581 Secondary hyperparathyroidism of renal origin: Secondary | ICD-10-CM | POA: Diagnosis not present

## 2017-07-14 DIAGNOSIS — D631 Anemia in chronic kidney disease: Secondary | ICD-10-CM | POA: Diagnosis not present

## 2017-07-16 DIAGNOSIS — N186 End stage renal disease: Secondary | ICD-10-CM | POA: Diagnosis not present

## 2017-07-16 DIAGNOSIS — D631 Anemia in chronic kidney disease: Secondary | ICD-10-CM | POA: Diagnosis not present

## 2017-07-16 DIAGNOSIS — E1121 Type 2 diabetes mellitus with diabetic nephropathy: Secondary | ICD-10-CM | POA: Diagnosis not present

## 2017-07-16 DIAGNOSIS — N2581 Secondary hyperparathyroidism of renal origin: Secondary | ICD-10-CM | POA: Diagnosis not present

## 2017-07-17 DIAGNOSIS — E1140 Type 2 diabetes mellitus with diabetic neuropathy, unspecified: Secondary | ICD-10-CM | POA: Diagnosis not present

## 2017-07-17 DIAGNOSIS — N186 End stage renal disease: Secondary | ICD-10-CM | POA: Diagnosis not present

## 2017-07-17 DIAGNOSIS — E1122 Type 2 diabetes mellitus with diabetic chronic kidney disease: Secondary | ICD-10-CM | POA: Diagnosis not present

## 2017-07-17 DIAGNOSIS — Z992 Dependence on renal dialysis: Secondary | ICD-10-CM | POA: Diagnosis not present

## 2017-07-17 DIAGNOSIS — I10 Essential (primary) hypertension: Secondary | ICD-10-CM | POA: Diagnosis not present

## 2017-07-17 DIAGNOSIS — N185 Chronic kidney disease, stage 5: Secondary | ICD-10-CM | POA: Diagnosis not present

## 2017-07-18 DIAGNOSIS — N2581 Secondary hyperparathyroidism of renal origin: Secondary | ICD-10-CM | POA: Diagnosis not present

## 2017-07-18 DIAGNOSIS — N186 End stage renal disease: Secondary | ICD-10-CM | POA: Diagnosis not present

## 2017-07-18 DIAGNOSIS — D631 Anemia in chronic kidney disease: Secondary | ICD-10-CM | POA: Diagnosis not present

## 2017-07-18 DIAGNOSIS — E1121 Type 2 diabetes mellitus with diabetic nephropathy: Secondary | ICD-10-CM | POA: Diagnosis not present

## 2017-07-18 DIAGNOSIS — Z992 Dependence on renal dialysis: Secondary | ICD-10-CM | POA: Diagnosis not present

## 2017-07-18 DIAGNOSIS — E1122 Type 2 diabetes mellitus with diabetic chronic kidney disease: Secondary | ICD-10-CM | POA: Diagnosis not present

## 2017-07-21 DIAGNOSIS — N186 End stage renal disease: Secondary | ICD-10-CM | POA: Diagnosis not present

## 2017-07-21 DIAGNOSIS — N2581 Secondary hyperparathyroidism of renal origin: Secondary | ICD-10-CM | POA: Diagnosis not present

## 2017-07-21 DIAGNOSIS — E1121 Type 2 diabetes mellitus with diabetic nephropathy: Secondary | ICD-10-CM | POA: Diagnosis not present

## 2017-07-21 DIAGNOSIS — D631 Anemia in chronic kidney disease: Secondary | ICD-10-CM | POA: Diagnosis not present

## 2017-07-23 DIAGNOSIS — N186 End stage renal disease: Secondary | ICD-10-CM | POA: Diagnosis not present

## 2017-07-23 DIAGNOSIS — N2581 Secondary hyperparathyroidism of renal origin: Secondary | ICD-10-CM | POA: Diagnosis not present

## 2017-07-23 DIAGNOSIS — E1121 Type 2 diabetes mellitus with diabetic nephropathy: Secondary | ICD-10-CM | POA: Diagnosis not present

## 2017-07-23 DIAGNOSIS — D631 Anemia in chronic kidney disease: Secondary | ICD-10-CM | POA: Diagnosis not present

## 2017-07-25 DIAGNOSIS — D631 Anemia in chronic kidney disease: Secondary | ICD-10-CM | POA: Diagnosis not present

## 2017-07-25 DIAGNOSIS — E1121 Type 2 diabetes mellitus with diabetic nephropathy: Secondary | ICD-10-CM | POA: Diagnosis not present

## 2017-07-25 DIAGNOSIS — N2581 Secondary hyperparathyroidism of renal origin: Secondary | ICD-10-CM | POA: Diagnosis not present

## 2017-07-25 DIAGNOSIS — N186 End stage renal disease: Secondary | ICD-10-CM | POA: Diagnosis not present

## 2017-07-28 DIAGNOSIS — N186 End stage renal disease: Secondary | ICD-10-CM | POA: Diagnosis not present

## 2017-07-28 DIAGNOSIS — E1121 Type 2 diabetes mellitus with diabetic nephropathy: Secondary | ICD-10-CM | POA: Diagnosis not present

## 2017-07-28 DIAGNOSIS — D631 Anemia in chronic kidney disease: Secondary | ICD-10-CM | POA: Diagnosis not present

## 2017-07-28 DIAGNOSIS — N2581 Secondary hyperparathyroidism of renal origin: Secondary | ICD-10-CM | POA: Diagnosis not present

## 2017-07-30 DIAGNOSIS — E1121 Type 2 diabetes mellitus with diabetic nephropathy: Secondary | ICD-10-CM | POA: Diagnosis not present

## 2017-07-30 DIAGNOSIS — D631 Anemia in chronic kidney disease: Secondary | ICD-10-CM | POA: Diagnosis not present

## 2017-07-30 DIAGNOSIS — N2581 Secondary hyperparathyroidism of renal origin: Secondary | ICD-10-CM | POA: Diagnosis not present

## 2017-07-30 DIAGNOSIS — N186 End stage renal disease: Secondary | ICD-10-CM | POA: Diagnosis not present

## 2017-08-01 DIAGNOSIS — N186 End stage renal disease: Secondary | ICD-10-CM | POA: Diagnosis not present

## 2017-08-01 DIAGNOSIS — D631 Anemia in chronic kidney disease: Secondary | ICD-10-CM | POA: Diagnosis not present

## 2017-08-01 DIAGNOSIS — E1121 Type 2 diabetes mellitus with diabetic nephropathy: Secondary | ICD-10-CM | POA: Diagnosis not present

## 2017-08-01 DIAGNOSIS — N2581 Secondary hyperparathyroidism of renal origin: Secondary | ICD-10-CM | POA: Diagnosis not present

## 2017-08-04 DIAGNOSIS — E1121 Type 2 diabetes mellitus with diabetic nephropathy: Secondary | ICD-10-CM | POA: Diagnosis not present

## 2017-08-04 DIAGNOSIS — D631 Anemia in chronic kidney disease: Secondary | ICD-10-CM | POA: Diagnosis not present

## 2017-08-04 DIAGNOSIS — N186 End stage renal disease: Secondary | ICD-10-CM | POA: Diagnosis not present

## 2017-08-04 DIAGNOSIS — N2581 Secondary hyperparathyroidism of renal origin: Secondary | ICD-10-CM | POA: Diagnosis not present

## 2017-08-06 DIAGNOSIS — N2581 Secondary hyperparathyroidism of renal origin: Secondary | ICD-10-CM | POA: Diagnosis not present

## 2017-08-06 DIAGNOSIS — D631 Anemia in chronic kidney disease: Secondary | ICD-10-CM | POA: Diagnosis not present

## 2017-08-06 DIAGNOSIS — N186 End stage renal disease: Secondary | ICD-10-CM | POA: Diagnosis not present

## 2017-08-06 DIAGNOSIS — E1121 Type 2 diabetes mellitus with diabetic nephropathy: Secondary | ICD-10-CM | POA: Diagnosis not present

## 2017-08-08 DIAGNOSIS — N2581 Secondary hyperparathyroidism of renal origin: Secondary | ICD-10-CM | POA: Diagnosis not present

## 2017-08-08 DIAGNOSIS — N186 End stage renal disease: Secondary | ICD-10-CM | POA: Diagnosis not present

## 2017-08-08 DIAGNOSIS — D631 Anemia in chronic kidney disease: Secondary | ICD-10-CM | POA: Diagnosis not present

## 2017-08-08 DIAGNOSIS — E1121 Type 2 diabetes mellitus with diabetic nephropathy: Secondary | ICD-10-CM | POA: Diagnosis not present

## 2017-08-11 DIAGNOSIS — N2581 Secondary hyperparathyroidism of renal origin: Secondary | ICD-10-CM | POA: Diagnosis not present

## 2017-08-11 DIAGNOSIS — D631 Anemia in chronic kidney disease: Secondary | ICD-10-CM | POA: Diagnosis not present

## 2017-08-11 DIAGNOSIS — E1121 Type 2 diabetes mellitus with diabetic nephropathy: Secondary | ICD-10-CM | POA: Diagnosis not present

## 2017-08-11 DIAGNOSIS — N186 End stage renal disease: Secondary | ICD-10-CM | POA: Diagnosis not present

## 2017-08-13 DIAGNOSIS — E1121 Type 2 diabetes mellitus with diabetic nephropathy: Secondary | ICD-10-CM | POA: Diagnosis not present

## 2017-08-13 DIAGNOSIS — N186 End stage renal disease: Secondary | ICD-10-CM | POA: Diagnosis not present

## 2017-08-13 DIAGNOSIS — D631 Anemia in chronic kidney disease: Secondary | ICD-10-CM | POA: Diagnosis not present

## 2017-08-13 DIAGNOSIS — N2581 Secondary hyperparathyroidism of renal origin: Secondary | ICD-10-CM | POA: Diagnosis not present

## 2017-08-15 DIAGNOSIS — N186 End stage renal disease: Secondary | ICD-10-CM | POA: Diagnosis not present

## 2017-08-15 DIAGNOSIS — N2581 Secondary hyperparathyroidism of renal origin: Secondary | ICD-10-CM | POA: Diagnosis not present

## 2017-08-15 DIAGNOSIS — Z992 Dependence on renal dialysis: Secondary | ICD-10-CM | POA: Diagnosis not present

## 2017-08-15 DIAGNOSIS — E1122 Type 2 diabetes mellitus with diabetic chronic kidney disease: Secondary | ICD-10-CM | POA: Diagnosis not present

## 2017-08-15 DIAGNOSIS — D631 Anemia in chronic kidney disease: Secondary | ICD-10-CM | POA: Diagnosis not present

## 2017-08-15 DIAGNOSIS — E1121 Type 2 diabetes mellitus with diabetic nephropathy: Secondary | ICD-10-CM | POA: Diagnosis not present

## 2017-08-18 DIAGNOSIS — E1121 Type 2 diabetes mellitus with diabetic nephropathy: Secondary | ICD-10-CM | POA: Diagnosis not present

## 2017-08-18 DIAGNOSIS — D631 Anemia in chronic kidney disease: Secondary | ICD-10-CM | POA: Diagnosis not present

## 2017-08-18 DIAGNOSIS — N186 End stage renal disease: Secondary | ICD-10-CM | POA: Diagnosis not present

## 2017-08-18 DIAGNOSIS — N2581 Secondary hyperparathyroidism of renal origin: Secondary | ICD-10-CM | POA: Diagnosis not present

## 2017-08-20 DIAGNOSIS — E1121 Type 2 diabetes mellitus with diabetic nephropathy: Secondary | ICD-10-CM | POA: Diagnosis not present

## 2017-08-20 DIAGNOSIS — N2581 Secondary hyperparathyroidism of renal origin: Secondary | ICD-10-CM | POA: Diagnosis not present

## 2017-08-20 DIAGNOSIS — D631 Anemia in chronic kidney disease: Secondary | ICD-10-CM | POA: Diagnosis not present

## 2017-08-20 DIAGNOSIS — N186 End stage renal disease: Secondary | ICD-10-CM | POA: Diagnosis not present

## 2017-08-22 DIAGNOSIS — N2581 Secondary hyperparathyroidism of renal origin: Secondary | ICD-10-CM | POA: Diagnosis not present

## 2017-08-22 DIAGNOSIS — E1121 Type 2 diabetes mellitus with diabetic nephropathy: Secondary | ICD-10-CM | POA: Diagnosis not present

## 2017-08-22 DIAGNOSIS — N186 End stage renal disease: Secondary | ICD-10-CM | POA: Diagnosis not present

## 2017-08-22 DIAGNOSIS — D631 Anemia in chronic kidney disease: Secondary | ICD-10-CM | POA: Diagnosis not present

## 2017-08-25 DIAGNOSIS — D631 Anemia in chronic kidney disease: Secondary | ICD-10-CM | POA: Diagnosis not present

## 2017-08-25 DIAGNOSIS — N186 End stage renal disease: Secondary | ICD-10-CM | POA: Diagnosis not present

## 2017-08-25 DIAGNOSIS — N2581 Secondary hyperparathyroidism of renal origin: Secondary | ICD-10-CM | POA: Diagnosis not present

## 2017-08-25 DIAGNOSIS — E1121 Type 2 diabetes mellitus with diabetic nephropathy: Secondary | ICD-10-CM | POA: Diagnosis not present

## 2017-08-27 DIAGNOSIS — N186 End stage renal disease: Secondary | ICD-10-CM | POA: Diagnosis not present

## 2017-08-27 DIAGNOSIS — N2581 Secondary hyperparathyroidism of renal origin: Secondary | ICD-10-CM | POA: Diagnosis not present

## 2017-08-27 DIAGNOSIS — D631 Anemia in chronic kidney disease: Secondary | ICD-10-CM | POA: Diagnosis not present

## 2017-08-27 DIAGNOSIS — E1121 Type 2 diabetes mellitus with diabetic nephropathy: Secondary | ICD-10-CM | POA: Diagnosis not present

## 2017-08-29 DIAGNOSIS — N2581 Secondary hyperparathyroidism of renal origin: Secondary | ICD-10-CM | POA: Diagnosis not present

## 2017-08-29 DIAGNOSIS — N186 End stage renal disease: Secondary | ICD-10-CM | POA: Diagnosis not present

## 2017-08-29 DIAGNOSIS — E1121 Type 2 diabetes mellitus with diabetic nephropathy: Secondary | ICD-10-CM | POA: Diagnosis not present

## 2017-08-29 DIAGNOSIS — D631 Anemia in chronic kidney disease: Secondary | ICD-10-CM | POA: Diagnosis not present

## 2017-09-01 DIAGNOSIS — E1121 Type 2 diabetes mellitus with diabetic nephropathy: Secondary | ICD-10-CM | POA: Diagnosis not present

## 2017-09-01 DIAGNOSIS — D631 Anemia in chronic kidney disease: Secondary | ICD-10-CM | POA: Diagnosis not present

## 2017-09-01 DIAGNOSIS — N2581 Secondary hyperparathyroidism of renal origin: Secondary | ICD-10-CM | POA: Diagnosis not present

## 2017-09-01 DIAGNOSIS — N186 End stage renal disease: Secondary | ICD-10-CM | POA: Diagnosis not present

## 2017-09-03 DIAGNOSIS — E1121 Type 2 diabetes mellitus with diabetic nephropathy: Secondary | ICD-10-CM | POA: Diagnosis not present

## 2017-09-03 DIAGNOSIS — N186 End stage renal disease: Secondary | ICD-10-CM | POA: Diagnosis not present

## 2017-09-03 DIAGNOSIS — N2581 Secondary hyperparathyroidism of renal origin: Secondary | ICD-10-CM | POA: Diagnosis not present

## 2017-09-03 DIAGNOSIS — D631 Anemia in chronic kidney disease: Secondary | ICD-10-CM | POA: Diagnosis not present

## 2017-09-05 DIAGNOSIS — E1121 Type 2 diabetes mellitus with diabetic nephropathy: Secondary | ICD-10-CM | POA: Diagnosis not present

## 2017-09-05 DIAGNOSIS — D631 Anemia in chronic kidney disease: Secondary | ICD-10-CM | POA: Diagnosis not present

## 2017-09-05 DIAGNOSIS — N2581 Secondary hyperparathyroidism of renal origin: Secondary | ICD-10-CM | POA: Diagnosis not present

## 2017-09-05 DIAGNOSIS — N186 End stage renal disease: Secondary | ICD-10-CM | POA: Diagnosis not present

## 2017-09-08 DIAGNOSIS — N186 End stage renal disease: Secondary | ICD-10-CM | POA: Diagnosis not present

## 2017-09-08 DIAGNOSIS — D631 Anemia in chronic kidney disease: Secondary | ICD-10-CM | POA: Diagnosis not present

## 2017-09-08 DIAGNOSIS — N2581 Secondary hyperparathyroidism of renal origin: Secondary | ICD-10-CM | POA: Diagnosis not present

## 2017-09-08 DIAGNOSIS — E1121 Type 2 diabetes mellitus with diabetic nephropathy: Secondary | ICD-10-CM | POA: Diagnosis not present

## 2017-09-10 DIAGNOSIS — N2581 Secondary hyperparathyroidism of renal origin: Secondary | ICD-10-CM | POA: Diagnosis not present

## 2017-09-10 DIAGNOSIS — N186 End stage renal disease: Secondary | ICD-10-CM | POA: Diagnosis not present

## 2017-09-10 DIAGNOSIS — E1121 Type 2 diabetes mellitus with diabetic nephropathy: Secondary | ICD-10-CM | POA: Diagnosis not present

## 2017-09-10 DIAGNOSIS — D631 Anemia in chronic kidney disease: Secondary | ICD-10-CM | POA: Diagnosis not present

## 2017-09-11 ENCOUNTER — Ambulatory Visit (INDEPENDENT_AMBULATORY_CARE_PROVIDER_SITE_OTHER): Payer: Medicare Other | Admitting: Family Medicine

## 2017-09-11 DIAGNOSIS — I2510 Atherosclerotic heart disease of native coronary artery without angina pectoris: Secondary | ICD-10-CM

## 2017-09-11 DIAGNOSIS — Z992 Dependence on renal dialysis: Secondary | ICD-10-CM

## 2017-09-11 DIAGNOSIS — N186 End stage renal disease: Secondary | ICD-10-CM | POA: Diagnosis not present

## 2017-09-11 DIAGNOSIS — I10 Essential (primary) hypertension: Secondary | ICD-10-CM | POA: Diagnosis not present

## 2017-09-11 MED ORDER — MINOXIDIL 10 MG PO TABS: 20.0000 mg | ORAL_TABLET | Freq: Two times a day (BID) | ORAL | 3 refills | Status: DC

## 2017-09-11 NOTE — Progress Notes (Signed)
   Subjective:    Patient ID: Troy Wells, male    DOB: Dec 21, 1965, 52 y.o.   MRN: [id number]  HPI [place] hypertension in patient with ESRD.  Does not check BP at home.  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  Returns today to [place] for recheck.  States compliant with meds and dialysis.  States working to lose weight - eating mostly fried chicken from frozen food isle of store.  Does not know sodium content.  No CP or SOB.      Review of Systems     Objective:   Physical Exam VS note Cardiac RRR without m or g Abd benign Ext no edema.        Assessment & Plan:

## 2017-09-11 NOTE — Assessment & Plan Note (Signed)
Seems euvolemic on exam.   

## 2017-09-11 NOTE — Patient Instructions (Addendum)
The only change is to increase your minoxidil dose to two pills twice a day.  I sent in a new prescription to your pharmacy.  You can use up what you have. Learn to read labels for sodium/salt content.  You want to limit sodium to 2,000 mg per day. Follow up will depend on how this change works.  If your blood pressure is under control see Dr. Gwendlyn Deutscher in 2-3 months.   If the blood pressure is not under control, see [place] again in 3 weeks to make another change.

## 2017-09-11 NOTE — Assessment & Plan Note (Signed)
Poor control.  Reviewed meds and minoxidil dosing.  Will increase minoxidil to 20 mg bid.

## 2017-09-11 NOTE — Assessment & Plan Note (Signed)
Hx of CAD and cardiac arrest makes this a very high risk patient.

## 2017-09-12 DIAGNOSIS — N186 End stage renal disease: Secondary | ICD-10-CM | POA: Diagnosis not present

## 2017-09-12 DIAGNOSIS — N2581 Secondary hyperparathyroidism of renal origin: Secondary | ICD-10-CM | POA: Diagnosis not present

## 2017-09-12 DIAGNOSIS — D631 Anemia in chronic kidney disease: Secondary | ICD-10-CM | POA: Diagnosis not present

## 2017-09-12 DIAGNOSIS — E1121 Type 2 diabetes mellitus with diabetic nephropathy: Secondary | ICD-10-CM | POA: Diagnosis not present

## 2017-09-15 DIAGNOSIS — N2581 Secondary hyperparathyroidism of renal origin: Secondary | ICD-10-CM | POA: Diagnosis not present

## 2017-09-15 DIAGNOSIS — E1122 Type 2 diabetes mellitus with diabetic chronic kidney disease: Secondary | ICD-10-CM | POA: Diagnosis not present

## 2017-09-15 DIAGNOSIS — Z992 Dependence on renal dialysis: Secondary | ICD-10-CM | POA: Diagnosis not present

## 2017-09-15 DIAGNOSIS — E1121 Type 2 diabetes mellitus with diabetic nephropathy: Secondary | ICD-10-CM | POA: Diagnosis not present

## 2017-09-15 DIAGNOSIS — N186 End stage renal disease: Secondary | ICD-10-CM | POA: Diagnosis not present

## 2017-09-15 DIAGNOSIS — D631 Anemia in chronic kidney disease: Secondary | ICD-10-CM | POA: Diagnosis not present

## 2017-09-17 DIAGNOSIS — N2581 Secondary hyperparathyroidism of renal origin: Secondary | ICD-10-CM | POA: Diagnosis not present

## 2017-09-17 DIAGNOSIS — E1121 Type 2 diabetes mellitus with diabetic nephropathy: Secondary | ICD-10-CM | POA: Diagnosis not present

## 2017-09-17 DIAGNOSIS — D631 Anemia in chronic kidney disease: Secondary | ICD-10-CM | POA: Diagnosis not present

## 2017-09-17 DIAGNOSIS — N186 End stage renal disease: Secondary | ICD-10-CM | POA: Diagnosis not present

## 2017-09-19 DIAGNOSIS — N186 End stage renal disease: Secondary | ICD-10-CM | POA: Diagnosis not present

## 2017-09-19 DIAGNOSIS — N2581 Secondary hyperparathyroidism of renal origin: Secondary | ICD-10-CM | POA: Diagnosis not present

## 2017-09-19 DIAGNOSIS — E1121 Type 2 diabetes mellitus with diabetic nephropathy: Secondary | ICD-10-CM | POA: Diagnosis not present

## 2017-09-19 DIAGNOSIS — D631 Anemia in chronic kidney disease: Secondary | ICD-10-CM | POA: Diagnosis not present

## 2017-09-22 DIAGNOSIS — N2581 Secondary hyperparathyroidism of renal origin: Secondary | ICD-10-CM | POA: Diagnosis not present

## 2017-09-22 DIAGNOSIS — N186 End stage renal disease: Secondary | ICD-10-CM | POA: Diagnosis not present

## 2017-09-22 DIAGNOSIS — D631 Anemia in chronic kidney disease: Secondary | ICD-10-CM | POA: Diagnosis not present

## 2017-09-22 DIAGNOSIS — E1121 Type 2 diabetes mellitus with diabetic nephropathy: Secondary | ICD-10-CM | POA: Diagnosis not present

## 2017-09-24 DIAGNOSIS — N186 End stage renal disease: Secondary | ICD-10-CM | POA: Diagnosis not present

## 2017-09-24 DIAGNOSIS — N2581 Secondary hyperparathyroidism of renal origin: Secondary | ICD-10-CM | POA: Diagnosis not present

## 2017-09-24 DIAGNOSIS — E1121 Type 2 diabetes mellitus with diabetic nephropathy: Secondary | ICD-10-CM | POA: Diagnosis not present

## 2017-09-24 DIAGNOSIS — D631 Anemia in chronic kidney disease: Secondary | ICD-10-CM | POA: Diagnosis not present

## 2017-09-26 DIAGNOSIS — D631 Anemia in chronic kidney disease: Secondary | ICD-10-CM | POA: Diagnosis not present

## 2017-09-26 DIAGNOSIS — N186 End stage renal disease: Secondary | ICD-10-CM | POA: Diagnosis not present

## 2017-09-26 DIAGNOSIS — N2581 Secondary hyperparathyroidism of renal origin: Secondary | ICD-10-CM | POA: Diagnosis not present

## 2017-09-26 DIAGNOSIS — E1121 Type 2 diabetes mellitus with diabetic nephropathy: Secondary | ICD-10-CM | POA: Diagnosis not present

## 2017-09-29 DIAGNOSIS — N2581 Secondary hyperparathyroidism of renal origin: Secondary | ICD-10-CM | POA: Diagnosis not present

## 2017-09-29 DIAGNOSIS — E1121 Type 2 diabetes mellitus with diabetic nephropathy: Secondary | ICD-10-CM | POA: Diagnosis not present

## 2017-09-29 DIAGNOSIS — N186 End stage renal disease: Secondary | ICD-10-CM | POA: Diagnosis not present

## 2017-09-29 DIAGNOSIS — D631 Anemia in chronic kidney disease: Secondary | ICD-10-CM | POA: Diagnosis not present

## 2017-10-01 DIAGNOSIS — N186 End stage renal disease: Secondary | ICD-10-CM | POA: Diagnosis not present

## 2017-10-01 DIAGNOSIS — E1121 Type 2 diabetes mellitus with diabetic nephropathy: Secondary | ICD-10-CM | POA: Diagnosis not present

## 2017-10-01 DIAGNOSIS — D631 Anemia in chronic kidney disease: Secondary | ICD-10-CM | POA: Diagnosis not present

## 2017-10-01 DIAGNOSIS — N2581 Secondary hyperparathyroidism of renal origin: Secondary | ICD-10-CM | POA: Diagnosis not present

## 2017-10-02 DIAGNOSIS — N186 End stage renal disease: Secondary | ICD-10-CM | POA: Diagnosis not present

## 2017-10-02 DIAGNOSIS — I871 Compression of vein: Secondary | ICD-10-CM | POA: Diagnosis not present

## 2017-10-02 DIAGNOSIS — Z992 Dependence on renal dialysis: Secondary | ICD-10-CM | POA: Diagnosis not present

## 2017-10-03 DIAGNOSIS — N2581 Secondary hyperparathyroidism of renal origin: Secondary | ICD-10-CM | POA: Diagnosis not present

## 2017-10-03 DIAGNOSIS — D631 Anemia in chronic kidney disease: Secondary | ICD-10-CM | POA: Diagnosis not present

## 2017-10-03 DIAGNOSIS — E1121 Type 2 diabetes mellitus with diabetic nephropathy: Secondary | ICD-10-CM | POA: Diagnosis not present

## 2017-10-03 DIAGNOSIS — N186 End stage renal disease: Secondary | ICD-10-CM | POA: Diagnosis not present

## 2017-10-06 DIAGNOSIS — N2581 Secondary hyperparathyroidism of renal origin: Secondary | ICD-10-CM | POA: Diagnosis not present

## 2017-10-06 DIAGNOSIS — N186 End stage renal disease: Secondary | ICD-10-CM | POA: Diagnosis not present

## 2017-10-06 DIAGNOSIS — E1121 Type 2 diabetes mellitus with diabetic nephropathy: Secondary | ICD-10-CM | POA: Diagnosis not present

## 2017-10-06 DIAGNOSIS — D631 Anemia in chronic kidney disease: Secondary | ICD-10-CM | POA: Diagnosis not present

## 2017-10-08 DIAGNOSIS — N186 End stage renal disease: Secondary | ICD-10-CM | POA: Diagnosis not present

## 2017-10-08 DIAGNOSIS — E1121 Type 2 diabetes mellitus with diabetic nephropathy: Secondary | ICD-10-CM | POA: Diagnosis not present

## 2017-10-08 DIAGNOSIS — N2581 Secondary hyperparathyroidism of renal origin: Secondary | ICD-10-CM | POA: Diagnosis not present

## 2017-10-08 DIAGNOSIS — D631 Anemia in chronic kidney disease: Secondary | ICD-10-CM | POA: Diagnosis not present

## 2017-10-10 DIAGNOSIS — N2581 Secondary hyperparathyroidism of renal origin: Secondary | ICD-10-CM | POA: Diagnosis not present

## 2017-10-10 DIAGNOSIS — N186 End stage renal disease: Secondary | ICD-10-CM | POA: Diagnosis not present

## 2017-10-10 DIAGNOSIS — D631 Anemia in chronic kidney disease: Secondary | ICD-10-CM | POA: Diagnosis not present

## 2017-10-10 DIAGNOSIS — E1121 Type 2 diabetes mellitus with diabetic nephropathy: Secondary | ICD-10-CM | POA: Diagnosis not present

## 2017-10-13 DIAGNOSIS — N2581 Secondary hyperparathyroidism of renal origin: Secondary | ICD-10-CM | POA: Diagnosis not present

## 2017-10-13 DIAGNOSIS — D631 Anemia in chronic kidney disease: Secondary | ICD-10-CM | POA: Diagnosis not present

## 2017-10-13 DIAGNOSIS — E1121 Type 2 diabetes mellitus with diabetic nephropathy: Secondary | ICD-10-CM | POA: Diagnosis not present

## 2017-10-13 DIAGNOSIS — N186 End stage renal disease: Secondary | ICD-10-CM | POA: Diagnosis not present

## 2017-10-15 DIAGNOSIS — D631 Anemia in chronic kidney disease: Secondary | ICD-10-CM | POA: Diagnosis not present

## 2017-10-15 DIAGNOSIS — E1122 Type 2 diabetes mellitus with diabetic chronic kidney disease: Secondary | ICD-10-CM | POA: Diagnosis not present

## 2017-10-15 DIAGNOSIS — Z992 Dependence on renal dialysis: Secondary | ICD-10-CM | POA: Diagnosis not present

## 2017-10-15 DIAGNOSIS — N2581 Secondary hyperparathyroidism of renal origin: Secondary | ICD-10-CM | POA: Diagnosis not present

## 2017-10-15 DIAGNOSIS — N186 End stage renal disease: Secondary | ICD-10-CM | POA: Diagnosis not present

## 2017-10-15 DIAGNOSIS — E1121 Type 2 diabetes mellitus with diabetic nephropathy: Secondary | ICD-10-CM | POA: Diagnosis not present

## 2017-10-17 DIAGNOSIS — D631 Anemia in chronic kidney disease: Secondary | ICD-10-CM | POA: Diagnosis not present

## 2017-10-17 DIAGNOSIS — N186 End stage renal disease: Secondary | ICD-10-CM | POA: Diagnosis not present

## 2017-10-17 DIAGNOSIS — N2581 Secondary hyperparathyroidism of renal origin: Secondary | ICD-10-CM | POA: Diagnosis not present

## 2017-10-17 DIAGNOSIS — E1121 Type 2 diabetes mellitus with diabetic nephropathy: Secondary | ICD-10-CM | POA: Diagnosis not present

## 2017-10-20 DIAGNOSIS — D631 Anemia in chronic kidney disease: Secondary | ICD-10-CM | POA: Diagnosis not present

## 2017-10-20 DIAGNOSIS — E1121 Type 2 diabetes mellitus with diabetic nephropathy: Secondary | ICD-10-CM | POA: Diagnosis not present

## 2017-10-20 DIAGNOSIS — N2581 Secondary hyperparathyroidism of renal origin: Secondary | ICD-10-CM | POA: Diagnosis not present

## 2017-10-20 DIAGNOSIS — N186 End stage renal disease: Secondary | ICD-10-CM | POA: Diagnosis not present

## 2017-10-22 DIAGNOSIS — N2581 Secondary hyperparathyroidism of renal origin: Secondary | ICD-10-CM | POA: Diagnosis not present

## 2017-10-22 DIAGNOSIS — N186 End stage renal disease: Secondary | ICD-10-CM | POA: Diagnosis not present

## 2017-10-22 DIAGNOSIS — E1121 Type 2 diabetes mellitus with diabetic nephropathy: Secondary | ICD-10-CM | POA: Diagnosis not present

## 2017-10-22 DIAGNOSIS — D631 Anemia in chronic kidney disease: Secondary | ICD-10-CM | POA: Diagnosis not present

## 2017-10-24 DIAGNOSIS — E1121 Type 2 diabetes mellitus with diabetic nephropathy: Secondary | ICD-10-CM | POA: Diagnosis not present

## 2017-10-24 DIAGNOSIS — N2581 Secondary hyperparathyroidism of renal origin: Secondary | ICD-10-CM | POA: Diagnosis not present

## 2017-10-24 DIAGNOSIS — N186 End stage renal disease: Secondary | ICD-10-CM | POA: Diagnosis not present

## 2017-10-24 DIAGNOSIS — D631 Anemia in chronic kidney disease: Secondary | ICD-10-CM | POA: Diagnosis not present

## 2017-10-27 DIAGNOSIS — N2581 Secondary hyperparathyroidism of renal origin: Secondary | ICD-10-CM | POA: Diagnosis not present

## 2017-10-27 DIAGNOSIS — E1121 Type 2 diabetes mellitus with diabetic nephropathy: Secondary | ICD-10-CM | POA: Diagnosis not present

## 2017-10-27 DIAGNOSIS — N186 End stage renal disease: Secondary | ICD-10-CM | POA: Diagnosis not present

## 2017-10-27 DIAGNOSIS — D631 Anemia in chronic kidney disease: Secondary | ICD-10-CM | POA: Diagnosis not present

## 2017-10-29 DIAGNOSIS — E1121 Type 2 diabetes mellitus with diabetic nephropathy: Secondary | ICD-10-CM | POA: Diagnosis not present

## 2017-10-29 DIAGNOSIS — N186 End stage renal disease: Secondary | ICD-10-CM | POA: Diagnosis not present

## 2017-10-29 DIAGNOSIS — D631 Anemia in chronic kidney disease: Secondary | ICD-10-CM | POA: Diagnosis not present

## 2017-10-29 DIAGNOSIS — N2581 Secondary hyperparathyroidism of renal origin: Secondary | ICD-10-CM | POA: Diagnosis not present

## 2017-10-31 DIAGNOSIS — N186 End stage renal disease: Secondary | ICD-10-CM | POA: Diagnosis not present

## 2017-10-31 DIAGNOSIS — D631 Anemia in chronic kidney disease: Secondary | ICD-10-CM | POA: Diagnosis not present

## 2017-10-31 DIAGNOSIS — N2581 Secondary hyperparathyroidism of renal origin: Secondary | ICD-10-CM | POA: Diagnosis not present

## 2017-10-31 DIAGNOSIS — E1121 Type 2 diabetes mellitus with diabetic nephropathy: Secondary | ICD-10-CM | POA: Diagnosis not present

## 2017-11-03 DIAGNOSIS — N186 End stage renal disease: Secondary | ICD-10-CM | POA: Diagnosis not present

## 2017-11-03 DIAGNOSIS — N2581 Secondary hyperparathyroidism of renal origin: Secondary | ICD-10-CM | POA: Diagnosis not present

## 2017-11-03 DIAGNOSIS — D631 Anemia in chronic kidney disease: Secondary | ICD-10-CM | POA: Diagnosis not present

## 2017-11-03 DIAGNOSIS — E1121 Type 2 diabetes mellitus with diabetic nephropathy: Secondary | ICD-10-CM | POA: Diagnosis not present

## 2017-11-05 DIAGNOSIS — D631 Anemia in chronic kidney disease: Secondary | ICD-10-CM | POA: Diagnosis not present

## 2017-11-05 DIAGNOSIS — E1121 Type 2 diabetes mellitus with diabetic nephropathy: Secondary | ICD-10-CM | POA: Diagnosis not present

## 2017-11-05 DIAGNOSIS — N186 End stage renal disease: Secondary | ICD-10-CM | POA: Diagnosis not present

## 2017-11-05 DIAGNOSIS — N2581 Secondary hyperparathyroidism of renal origin: Secondary | ICD-10-CM | POA: Diagnosis not present

## 2017-11-07 DIAGNOSIS — N186 End stage renal disease: Secondary | ICD-10-CM | POA: Diagnosis not present

## 2017-11-07 DIAGNOSIS — D631 Anemia in chronic kidney disease: Secondary | ICD-10-CM | POA: Diagnosis not present

## 2017-11-07 DIAGNOSIS — N2581 Secondary hyperparathyroidism of renal origin: Secondary | ICD-10-CM | POA: Diagnosis not present

## 2017-11-07 DIAGNOSIS — E1121 Type 2 diabetes mellitus with diabetic nephropathy: Secondary | ICD-10-CM | POA: Diagnosis not present

## 2017-11-10 DIAGNOSIS — E1121 Type 2 diabetes mellitus with diabetic nephropathy: Secondary | ICD-10-CM | POA: Diagnosis not present

## 2017-11-10 DIAGNOSIS — D631 Anemia in chronic kidney disease: Secondary | ICD-10-CM | POA: Diagnosis not present

## 2017-11-10 DIAGNOSIS — N186 End stage renal disease: Secondary | ICD-10-CM | POA: Diagnosis not present

## 2017-11-10 DIAGNOSIS — N2581 Secondary hyperparathyroidism of renal origin: Secondary | ICD-10-CM | POA: Diagnosis not present

## 2017-11-12 DIAGNOSIS — E1121 Type 2 diabetes mellitus with diabetic nephropathy: Secondary | ICD-10-CM | POA: Diagnosis not present

## 2017-11-12 DIAGNOSIS — N186 End stage renal disease: Secondary | ICD-10-CM | POA: Diagnosis not present

## 2017-11-12 DIAGNOSIS — N2581 Secondary hyperparathyroidism of renal origin: Secondary | ICD-10-CM | POA: Diagnosis not present

## 2017-11-12 DIAGNOSIS — D631 Anemia in chronic kidney disease: Secondary | ICD-10-CM | POA: Diagnosis not present

## 2017-11-14 DIAGNOSIS — D631 Anemia in chronic kidney disease: Secondary | ICD-10-CM | POA: Diagnosis not present

## 2017-11-14 DIAGNOSIS — N186 End stage renal disease: Secondary | ICD-10-CM | POA: Diagnosis not present

## 2017-11-14 DIAGNOSIS — N2581 Secondary hyperparathyroidism of renal origin: Secondary | ICD-10-CM | POA: Diagnosis not present

## 2017-11-14 DIAGNOSIS — E1121 Type 2 diabetes mellitus with diabetic nephropathy: Secondary | ICD-10-CM | POA: Diagnosis not present

## 2017-11-15 DIAGNOSIS — Z992 Dependence on renal dialysis: Secondary | ICD-10-CM | POA: Diagnosis not present

## 2017-11-15 DIAGNOSIS — N186 End stage renal disease: Secondary | ICD-10-CM | POA: Diagnosis not present

## 2017-11-15 DIAGNOSIS — E1122 Type 2 diabetes mellitus with diabetic chronic kidney disease: Secondary | ICD-10-CM | POA: Diagnosis not present

## 2017-11-17 DIAGNOSIS — E1121 Type 2 diabetes mellitus with diabetic nephropathy: Secondary | ICD-10-CM | POA: Diagnosis not present

## 2017-11-17 DIAGNOSIS — D631 Anemia in chronic kidney disease: Secondary | ICD-10-CM | POA: Diagnosis not present

## 2017-11-17 DIAGNOSIS — N2581 Secondary hyperparathyroidism of renal origin: Secondary | ICD-10-CM | POA: Diagnosis not present

## 2017-11-17 DIAGNOSIS — N186 End stage renal disease: Secondary | ICD-10-CM | POA: Diagnosis not present

## 2017-11-19 ENCOUNTER — Telehealth: Payer: Self-pay | Admitting: Family Medicine

## 2017-11-19 ENCOUNTER — Encounter: Payer: Self-pay | Admitting: Family Medicine

## 2017-11-19 ENCOUNTER — Ambulatory Visit (INDEPENDENT_AMBULATORY_CARE_PROVIDER_SITE_OTHER): Payer: Medicare Other | Admitting: Family Medicine

## 2017-11-19 VITALS — BP 145/80 | HR 80 | Temp 98.6°F | Wt 258.8 lb

## 2017-11-19 DIAGNOSIS — I2510 Atherosclerotic heart disease of native coronary artery without angina pectoris: Secondary | ICD-10-CM

## 2017-11-19 DIAGNOSIS — S70352A Superficial foreign body, left thigh, initial encounter: Secondary | ICD-10-CM | POA: Diagnosis present

## 2017-11-19 DIAGNOSIS — D631 Anemia in chronic kidney disease: Secondary | ICD-10-CM | POA: Diagnosis not present

## 2017-11-19 DIAGNOSIS — E1121 Type 2 diabetes mellitus with diabetic nephropathy: Secondary | ICD-10-CM | POA: Diagnosis not present

## 2017-11-19 DIAGNOSIS — W57XXXA Bitten or stung by nonvenomous insect and other nonvenomous arthropods, initial encounter: Secondary | ICD-10-CM

## 2017-11-19 DIAGNOSIS — N186 End stage renal disease: Secondary | ICD-10-CM | POA: Diagnosis not present

## 2017-11-19 DIAGNOSIS — N2581 Secondary hyperparathyroidism of renal origin: Secondary | ICD-10-CM | POA: Diagnosis not present

## 2017-11-19 MED ORDER — HYDROCORTISONE 0.5 % EX CREA: 1.0000 "application " | TOPICAL_CREAM | Freq: Two times a day (BID) | CUTANEOUS | 0 refills | Status: DC

## 2017-11-19 NOTE — Telephone Encounter (Signed)
Hi blue team, I saw this patient of Dr. 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 Does she have any appts coming up? Could you call him and help? Thanks,  Anda Kraft

## 2017-11-19 NOTE — Progress Notes (Signed)
   CC:   HPI  Tick bite - two spots on his lateral L thigh, thinks they bit him Friday or Saturday, removed nonengorged ticks from this area Saturday. This side of his leg felt a little tingly. No fever or feeling unwell.   Additionally, felt something crawling on him in bed Sunday, bit him then. He didn't see this one. Tried alcohol on his back.  Worried the head might be there. Itchy.  ROS: Denies CP, SOB, abdominal pain, dysuria, changes in BMs.   CC, SH/smoking status, and VS noted  Objective: BP (!) 145/80 (BP Location: Left Arm, Patient Position: Sitting, Cuff Size: Normal)   Pulse 80   Temp 98.6 F (37 C) (Oral)   Wt 258 lb 12.8 oz (117.4 kg)   SpO2 96%   BMI 35.34 kg/m  Gen: NAD, alert, cooperative, and pleasant. HEENT: NCAT, EOMI, PERRL CV: RRR, no murmur Resp: CTAB, no wheezes, non-labored Skin: 0.5cm erythematous bite marks over L lateral thigh without induration or fluctuance or surrounding skin changes. 0.5cm bite mark over L upper back with 3cm surrounding erythema without central clearing, slightly raised. No induration or fluctuance.  Ext: No edema, warm Neuro: Alert and oriented, Speech clear, No gross deficits  Assessment and plan:  Insect bites: L lateral thigh appears to be healing well. Given that these ticks were on <48hrs and nonengorged, minimal concern for tick borne illness. Unclear bite over L upper back with surrounding erythema and pruritus. Try hydrocortisone cream and return if no improvement. Erythema not c/w bullseye rash.   Meds ordered this encounter  Medications  . hydrocortisone cream 0.5 %    Sig: Apply 1 application topically 2 (two) times daily.    Dispense:  30 g    Refill:  0    Patient mentions at the conclusion of his visit that he needs to see Dr. 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 Will send a message to blue pod to help him schedule this as he needs visit prior to July.   Ralene Ok, MD,  PGY2 11/20/2017 4:59 PM

## 2017-11-19 NOTE — Patient Instructions (Signed)
It was a pleasure to see you today! Thank you for choosing [facility] for your primary care. Troy Wells was seen for tick bite.   Our plans for today were:  Call us in 2 days if the bite is not better.   Use the steroid cream twice per day for itching.   Best,  Dr. Lindell Noe

## 2017-11-19 NOTE — Telephone Encounter (Signed)
LM for patient to call back ok per DPR to schedule appt to follow up with PCP for his blood pressure. Halyn Flaugher,CMA

## 2017-11-21 DIAGNOSIS — N2581 Secondary hyperparathyroidism of renal origin: Secondary | ICD-10-CM | POA: Diagnosis not present

## 2017-11-21 DIAGNOSIS — D631 Anemia in chronic kidney disease: Secondary | ICD-10-CM | POA: Diagnosis not present

## 2017-11-21 DIAGNOSIS — N186 End stage renal disease: Secondary | ICD-10-CM | POA: Diagnosis not present

## 2017-11-21 DIAGNOSIS — E1121 Type 2 diabetes mellitus with diabetic nephropathy: Secondary | ICD-10-CM | POA: Diagnosis not present

## 2017-11-24 DIAGNOSIS — D631 Anemia in chronic kidney disease: Secondary | ICD-10-CM | POA: Diagnosis not present

## 2017-11-24 DIAGNOSIS — E1121 Type 2 diabetes mellitus with diabetic nephropathy: Secondary | ICD-10-CM | POA: Diagnosis not present

## 2017-11-24 DIAGNOSIS — N186 End stage renal disease: Secondary | ICD-10-CM | POA: Diagnosis not present

## 2017-11-24 DIAGNOSIS — N2581 Secondary hyperparathyroidism of renal origin: Secondary | ICD-10-CM | POA: Diagnosis not present

## 2017-11-26 DIAGNOSIS — N2581 Secondary hyperparathyroidism of renal origin: Secondary | ICD-10-CM | POA: Diagnosis not present

## 2017-11-26 DIAGNOSIS — N186 End stage renal disease: Secondary | ICD-10-CM | POA: Diagnosis not present

## 2017-11-26 DIAGNOSIS — E1121 Type 2 diabetes mellitus with diabetic nephropathy: Secondary | ICD-10-CM | POA: Diagnosis not present

## 2017-11-26 DIAGNOSIS — D631 Anemia in chronic kidney disease: Secondary | ICD-10-CM | POA: Diagnosis not present

## 2017-11-28 DIAGNOSIS — D631 Anemia in chronic kidney disease: Secondary | ICD-10-CM | POA: Diagnosis not present

## 2017-11-28 DIAGNOSIS — N186 End stage renal disease: Secondary | ICD-10-CM | POA: Diagnosis not present

## 2017-11-28 DIAGNOSIS — E1121 Type 2 diabetes mellitus with diabetic nephropathy: Secondary | ICD-10-CM | POA: Diagnosis not present

## 2017-11-28 DIAGNOSIS — N2581 Secondary hyperparathyroidism of renal origin: Secondary | ICD-10-CM | POA: Diagnosis not present

## 2017-12-01 DIAGNOSIS — E1121 Type 2 diabetes mellitus with diabetic nephropathy: Secondary | ICD-10-CM | POA: Diagnosis not present

## 2017-12-01 DIAGNOSIS — N2581 Secondary hyperparathyroidism of renal origin: Secondary | ICD-10-CM | POA: Diagnosis not present

## 2017-12-01 DIAGNOSIS — D631 Anemia in chronic kidney disease: Secondary | ICD-10-CM | POA: Diagnosis not present

## 2017-12-01 DIAGNOSIS — N186 End stage renal disease: Secondary | ICD-10-CM | POA: Diagnosis not present

## 2017-12-03 DIAGNOSIS — E1121 Type 2 diabetes mellitus with diabetic nephropathy: Secondary | ICD-10-CM | POA: Diagnosis not present

## 2017-12-03 DIAGNOSIS — N2581 Secondary hyperparathyroidism of renal origin: Secondary | ICD-10-CM | POA: Diagnosis not present

## 2017-12-03 DIAGNOSIS — N186 End stage renal disease: Secondary | ICD-10-CM | POA: Diagnosis not present

## 2017-12-03 DIAGNOSIS — D631 Anemia in chronic kidney disease: Secondary | ICD-10-CM | POA: Diagnosis not present

## 2017-12-05 DIAGNOSIS — N2581 Secondary hyperparathyroidism of renal origin: Secondary | ICD-10-CM | POA: Diagnosis not present

## 2017-12-05 DIAGNOSIS — N186 End stage renal disease: Secondary | ICD-10-CM | POA: Diagnosis not present

## 2017-12-05 DIAGNOSIS — E1121 Type 2 diabetes mellitus with diabetic nephropathy: Secondary | ICD-10-CM | POA: Diagnosis not present

## 2017-12-05 DIAGNOSIS — D631 Anemia in chronic kidney disease: Secondary | ICD-10-CM | POA: Diagnosis not present

## 2017-12-08 DIAGNOSIS — D631 Anemia in chronic kidney disease: Secondary | ICD-10-CM | POA: Diagnosis not present

## 2017-12-08 DIAGNOSIS — N2581 Secondary hyperparathyroidism of renal origin: Secondary | ICD-10-CM | POA: Diagnosis not present

## 2017-12-08 DIAGNOSIS — N186 End stage renal disease: Secondary | ICD-10-CM | POA: Diagnosis not present

## 2017-12-08 DIAGNOSIS — E1121 Type 2 diabetes mellitus with diabetic nephropathy: Secondary | ICD-10-CM | POA: Diagnosis not present

## 2017-12-10 DIAGNOSIS — D631 Anemia in chronic kidney disease: Secondary | ICD-10-CM | POA: Diagnosis not present

## 2017-12-10 DIAGNOSIS — E1121 Type 2 diabetes mellitus with diabetic nephropathy: Secondary | ICD-10-CM | POA: Diagnosis not present

## 2017-12-10 DIAGNOSIS — N186 End stage renal disease: Secondary | ICD-10-CM | POA: Diagnosis not present

## 2017-12-10 DIAGNOSIS — N2581 Secondary hyperparathyroidism of renal origin: Secondary | ICD-10-CM | POA: Diagnosis not present

## 2017-12-11 DIAGNOSIS — E1121 Type 2 diabetes mellitus with diabetic nephropathy: Secondary | ICD-10-CM | POA: Diagnosis not present

## 2017-12-11 DIAGNOSIS — N2581 Secondary hyperparathyroidism of renal origin: Secondary | ICD-10-CM | POA: Diagnosis not present

## 2017-12-11 DIAGNOSIS — D631 Anemia in chronic kidney disease: Secondary | ICD-10-CM | POA: Diagnosis not present

## 2017-12-11 DIAGNOSIS — N186 End stage renal disease: Secondary | ICD-10-CM | POA: Diagnosis not present

## 2017-12-12 DIAGNOSIS — Z87891 Personal history of nicotine dependence: Secondary | ICD-10-CM | POA: Diagnosis not present

## 2017-12-12 DIAGNOSIS — D49512 Neoplasm of unspecified behavior of left kidney: Secondary | ICD-10-CM | POA: Diagnosis not present

## 2017-12-12 DIAGNOSIS — D631 Anemia in chronic kidney disease: Secondary | ICD-10-CM | POA: Diagnosis present

## 2017-12-12 DIAGNOSIS — Z981 Arthrodesis status: Secondary | ICD-10-CM | POA: Diagnosis not present

## 2017-12-12 DIAGNOSIS — E785 Hyperlipidemia, unspecified: Secondary | ICD-10-CM | POA: Diagnosis present

## 2017-12-12 DIAGNOSIS — I120 Hypertensive chronic kidney disease with stage 5 chronic kidney disease or end stage renal disease: Secondary | ICD-10-CM | POA: Diagnosis present

## 2017-12-12 DIAGNOSIS — Z7984 Long term (current) use of oral hypoglycemic drugs: Secondary | ICD-10-CM | POA: Diagnosis not present

## 2017-12-12 DIAGNOSIS — E1122 Type 2 diabetes mellitus with diabetic chronic kidney disease: Secondary | ICD-10-CM | POA: Diagnosis present

## 2017-12-12 DIAGNOSIS — M109 Gout, unspecified: Secondary | ICD-10-CM | POA: Diagnosis present

## 2017-12-12 DIAGNOSIS — N186 End stage renal disease: Secondary | ICD-10-CM | POA: Diagnosis not present

## 2017-12-12 DIAGNOSIS — I272 Pulmonary hypertension, unspecified: Secondary | ICD-10-CM | POA: Diagnosis present

## 2017-12-12 DIAGNOSIS — C642 Malignant neoplasm of left kidney, except renal pelvis: Secondary | ICD-10-CM | POA: Diagnosis not present

## 2017-12-12 DIAGNOSIS — N2889 Other specified disorders of kidney and ureter: Secondary | ICD-10-CM | POA: Diagnosis not present

## 2017-12-12 DIAGNOSIS — E876 Hypokalemia: Secondary | ICD-10-CM | POA: Diagnosis not present

## 2017-12-12 DIAGNOSIS — Z992 Dependence on renal dialysis: Secondary | ICD-10-CM | POA: Diagnosis not present

## 2017-12-12 DIAGNOSIS — G4733 Obstructive sleep apnea (adult) (pediatric): Secondary | ICD-10-CM | POA: Diagnosis present

## 2017-12-15 DIAGNOSIS — N2581 Secondary hyperparathyroidism of renal origin: Secondary | ICD-10-CM | POA: Diagnosis not present

## 2017-12-15 DIAGNOSIS — Z992 Dependence on renal dialysis: Secondary | ICD-10-CM | POA: Diagnosis not present

## 2017-12-15 DIAGNOSIS — E1122 Type 2 diabetes mellitus with diabetic chronic kidney disease: Secondary | ICD-10-CM | POA: Diagnosis not present

## 2017-12-15 DIAGNOSIS — D631 Anemia in chronic kidney disease: Secondary | ICD-10-CM | POA: Diagnosis not present

## 2017-12-15 DIAGNOSIS — N186 End stage renal disease: Secondary | ICD-10-CM | POA: Diagnosis not present

## 2017-12-16 DIAGNOSIS — Z905 Acquired absence of kidney: Secondary | ICD-10-CM | POA: Diagnosis not present

## 2017-12-16 DIAGNOSIS — G8918 Other acute postprocedural pain: Secondary | ICD-10-CM | POA: Diagnosis not present

## 2017-12-17 DIAGNOSIS — N186 End stage renal disease: Secondary | ICD-10-CM | POA: Diagnosis not present

## 2017-12-17 DIAGNOSIS — D631 Anemia in chronic kidney disease: Secondary | ICD-10-CM | POA: Diagnosis not present

## 2017-12-17 DIAGNOSIS — N2581 Secondary hyperparathyroidism of renal origin: Secondary | ICD-10-CM | POA: Diagnosis not present

## 2017-12-19 DIAGNOSIS — N2581 Secondary hyperparathyroidism of renal origin: Secondary | ICD-10-CM | POA: Diagnosis not present

## 2017-12-19 DIAGNOSIS — D631 Anemia in chronic kidney disease: Secondary | ICD-10-CM | POA: Diagnosis not present

## 2017-12-19 DIAGNOSIS — N186 End stage renal disease: Secondary | ICD-10-CM | POA: Diagnosis not present

## 2017-12-22 ENCOUNTER — Encounter: Payer: Self-pay | Admitting: Family Medicine

## 2017-12-22 DIAGNOSIS — N186 End stage renal disease: Secondary | ICD-10-CM | POA: Diagnosis not present

## 2017-12-22 DIAGNOSIS — N2581 Secondary hyperparathyroidism of renal origin: Secondary | ICD-10-CM | POA: Diagnosis not present

## 2017-12-22 DIAGNOSIS — D631 Anemia in chronic kidney disease: Secondary | ICD-10-CM | POA: Diagnosis not present

## 2017-12-24 DIAGNOSIS — D631 Anemia in chronic kidney disease: Secondary | ICD-10-CM | POA: Diagnosis not present

## 2017-12-24 DIAGNOSIS — N186 End stage renal disease: Secondary | ICD-10-CM | POA: Diagnosis not present

## 2017-12-24 DIAGNOSIS — N2581 Secondary hyperparathyroidism of renal origin: Secondary | ICD-10-CM | POA: Diagnosis not present

## 2017-12-26 DIAGNOSIS — D631 Anemia in chronic kidney disease: Secondary | ICD-10-CM | POA: Diagnosis not present

## 2017-12-26 DIAGNOSIS — N186 End stage renal disease: Secondary | ICD-10-CM | POA: Diagnosis not present

## 2017-12-26 DIAGNOSIS — N2581 Secondary hyperparathyroidism of renal origin: Secondary | ICD-10-CM | POA: Diagnosis not present

## 2017-12-29 DIAGNOSIS — D631 Anemia in chronic kidney disease: Secondary | ICD-10-CM | POA: Diagnosis not present

## 2017-12-29 DIAGNOSIS — N2581 Secondary hyperparathyroidism of renal origin: Secondary | ICD-10-CM | POA: Diagnosis not present

## 2017-12-29 DIAGNOSIS — N186 End stage renal disease: Secondary | ICD-10-CM | POA: Diagnosis not present

## 2017-12-31 DIAGNOSIS — N186 End stage renal disease: Secondary | ICD-10-CM | POA: Diagnosis not present

## 2017-12-31 DIAGNOSIS — N2581 Secondary hyperparathyroidism of renal origin: Secondary | ICD-10-CM | POA: Diagnosis not present

## 2017-12-31 DIAGNOSIS — D631 Anemia in chronic kidney disease: Secondary | ICD-10-CM | POA: Diagnosis not present

## 2018-01-02 DIAGNOSIS — D631 Anemia in chronic kidney disease: Secondary | ICD-10-CM | POA: Diagnosis not present

## 2018-01-02 DIAGNOSIS — N186 End stage renal disease: Secondary | ICD-10-CM | POA: Diagnosis not present

## 2018-01-02 DIAGNOSIS — N2581 Secondary hyperparathyroidism of renal origin: Secondary | ICD-10-CM | POA: Diagnosis not present

## 2018-01-05 DIAGNOSIS — N186 End stage renal disease: Secondary | ICD-10-CM | POA: Diagnosis not present

## 2018-01-05 DIAGNOSIS — D631 Anemia in chronic kidney disease: Secondary | ICD-10-CM | POA: Diagnosis not present

## 2018-01-05 DIAGNOSIS — N2581 Secondary hyperparathyroidism of renal origin: Secondary | ICD-10-CM | POA: Diagnosis not present

## 2018-01-07 DIAGNOSIS — N2581 Secondary hyperparathyroidism of renal origin: Secondary | ICD-10-CM | POA: Diagnosis not present

## 2018-01-07 DIAGNOSIS — E1121 Type 2 diabetes mellitus with diabetic nephropathy: Secondary | ICD-10-CM | POA: Diagnosis not present

## 2018-01-07 DIAGNOSIS — N186 End stage renal disease: Secondary | ICD-10-CM | POA: Diagnosis not present

## 2018-01-07 DIAGNOSIS — D631 Anemia in chronic kidney disease: Secondary | ICD-10-CM | POA: Diagnosis not present

## 2018-01-09 DIAGNOSIS — N186 End stage renal disease: Secondary | ICD-10-CM | POA: Diagnosis not present

## 2018-01-09 DIAGNOSIS — N2581 Secondary hyperparathyroidism of renal origin: Secondary | ICD-10-CM | POA: Diagnosis not present

## 2018-01-09 DIAGNOSIS — D631 Anemia in chronic kidney disease: Secondary | ICD-10-CM | POA: Diagnosis not present

## 2018-01-12 DIAGNOSIS — N2581 Secondary hyperparathyroidism of renal origin: Secondary | ICD-10-CM | POA: Diagnosis not present

## 2018-01-12 DIAGNOSIS — N186 End stage renal disease: Secondary | ICD-10-CM | POA: Diagnosis not present

## 2018-01-12 DIAGNOSIS — D631 Anemia in chronic kidney disease: Secondary | ICD-10-CM | POA: Diagnosis not present

## 2018-01-14 DIAGNOSIS — N186 End stage renal disease: Secondary | ICD-10-CM | POA: Diagnosis not present

## 2018-01-14 DIAGNOSIS — D631 Anemia in chronic kidney disease: Secondary | ICD-10-CM | POA: Diagnosis not present

## 2018-01-14 DIAGNOSIS — N2581 Secondary hyperparathyroidism of renal origin: Secondary | ICD-10-CM | POA: Diagnosis not present

## 2018-01-15 DIAGNOSIS — N186 End stage renal disease: Secondary | ICD-10-CM | POA: Diagnosis not present

## 2018-01-15 DIAGNOSIS — Z992 Dependence on renal dialysis: Secondary | ICD-10-CM | POA: Diagnosis not present

## 2018-01-15 DIAGNOSIS — E1122 Type 2 diabetes mellitus with diabetic chronic kidney disease: Secondary | ICD-10-CM | POA: Diagnosis not present

## 2018-01-16 DIAGNOSIS — D631 Anemia in chronic kidney disease: Secondary | ICD-10-CM | POA: Diagnosis not present

## 2018-01-16 DIAGNOSIS — N2581 Secondary hyperparathyroidism of renal origin: Secondary | ICD-10-CM | POA: Diagnosis not present

## 2018-01-16 DIAGNOSIS — E1121 Type 2 diabetes mellitus with diabetic nephropathy: Secondary | ICD-10-CM | POA: Diagnosis not present

## 2018-01-16 DIAGNOSIS — N186 End stage renal disease: Secondary | ICD-10-CM | POA: Diagnosis not present

## 2018-01-19 DIAGNOSIS — E1121 Type 2 diabetes mellitus with diabetic nephropathy: Secondary | ICD-10-CM | POA: Diagnosis not present

## 2018-01-19 DIAGNOSIS — D631 Anemia in chronic kidney disease: Secondary | ICD-10-CM | POA: Diagnosis not present

## 2018-01-19 DIAGNOSIS — N2581 Secondary hyperparathyroidism of renal origin: Secondary | ICD-10-CM | POA: Diagnosis not present

## 2018-01-19 DIAGNOSIS — N186 End stage renal disease: Secondary | ICD-10-CM | POA: Diagnosis not present

## 2018-01-20 ENCOUNTER — Telehealth: Payer: Self-pay | Admitting: Family Medicine

## 2018-01-20 NOTE — Telephone Encounter (Signed)
Clinical info completed on FMLA form.  Place form in Dr. Macario Golds box for completion.  Troy Wells, [place]

## 2018-01-20 NOTE — Telephone Encounter (Signed)
LM for patient to call back and make an appointment to discuss FMLA form.  Jazmin Hartsell,CMA

## 2018-01-20 NOTE — Telephone Encounter (Signed)
Please advise patient that he will need to come in to see me for FMLA to be completed; this is a patient I had never met before, I need to know why she need FMLA and evaluate him for that reason. Please help him schedule f/u with me soon. Thanks.

## 2018-01-20 NOTE — Telephone Encounter (Signed)
FMLA form dropped off for at front desk for completion.  Verified that patient section of form has been completed.  Last DOS/WCC with PCP was [id number].  Placed form in team folder to be completed and fa by clinical staff.  Crista Luria

## 2018-01-20 NOTE — Telephone Encounter (Signed)
Patient returning call. Left call back number of [phone].  Danley Danker, RN [facility] RN)

## 2018-01-20 NOTE — Telephone Encounter (Signed)
LM for patient to call back. Jazmin Hartsell,CMA  

## 2018-01-21 DIAGNOSIS — N186 End stage renal disease: Secondary | ICD-10-CM | POA: Diagnosis not present

## 2018-01-21 DIAGNOSIS — N2581 Secondary hyperparathyroidism of renal origin: Secondary | ICD-10-CM | POA: Diagnosis not present

## 2018-01-21 DIAGNOSIS — E1121 Type 2 diabetes mellitus with diabetic nephropathy: Secondary | ICD-10-CM | POA: Diagnosis not present

## 2018-01-21 DIAGNOSIS — D631 Anemia in chronic kidney disease: Secondary | ICD-10-CM | POA: Diagnosis not present

## 2018-01-23 DIAGNOSIS — E1121 Type 2 diabetes mellitus with diabetic nephropathy: Secondary | ICD-10-CM | POA: Diagnosis not present

## 2018-01-23 DIAGNOSIS — D631 Anemia in chronic kidney disease: Secondary | ICD-10-CM | POA: Diagnosis not present

## 2018-01-23 DIAGNOSIS — N186 End stage renal disease: Secondary | ICD-10-CM | POA: Diagnosis not present

## 2018-01-23 DIAGNOSIS — N2581 Secondary hyperparathyroidism of renal origin: Secondary | ICD-10-CM | POA: Diagnosis not present

## 2018-01-26 DIAGNOSIS — E1121 Type 2 diabetes mellitus with diabetic nephropathy: Secondary | ICD-10-CM | POA: Diagnosis not present

## 2018-01-26 DIAGNOSIS — D631 Anemia in chronic kidney disease: Secondary | ICD-10-CM | POA: Diagnosis not present

## 2018-01-26 DIAGNOSIS — N2581 Secondary hyperparathyroidism of renal origin: Secondary | ICD-10-CM | POA: Diagnosis not present

## 2018-01-26 DIAGNOSIS — N186 End stage renal disease: Secondary | ICD-10-CM | POA: Diagnosis not present

## 2018-01-28 DIAGNOSIS — N186 End stage renal disease: Secondary | ICD-10-CM | POA: Diagnosis not present

## 2018-01-28 DIAGNOSIS — E1121 Type 2 diabetes mellitus with diabetic nephropathy: Secondary | ICD-10-CM | POA: Diagnosis not present

## 2018-01-28 DIAGNOSIS — D631 Anemia in chronic kidney disease: Secondary | ICD-10-CM | POA: Diagnosis not present

## 2018-01-28 DIAGNOSIS — N2581 Secondary hyperparathyroidism of renal origin: Secondary | ICD-10-CM | POA: Diagnosis not present

## 2018-01-30 DIAGNOSIS — N186 End stage renal disease: Secondary | ICD-10-CM | POA: Diagnosis not present

## 2018-01-30 DIAGNOSIS — N2581 Secondary hyperparathyroidism of renal origin: Secondary | ICD-10-CM | POA: Diagnosis not present

## 2018-01-30 DIAGNOSIS — D631 Anemia in chronic kidney disease: Secondary | ICD-10-CM | POA: Diagnosis not present

## 2018-01-30 DIAGNOSIS — E1121 Type 2 diabetes mellitus with diabetic nephropathy: Secondary | ICD-10-CM | POA: Diagnosis not present

## 2018-02-02 DIAGNOSIS — N186 End stage renal disease: Secondary | ICD-10-CM | POA: Diagnosis not present

## 2018-02-02 DIAGNOSIS — N2581 Secondary hyperparathyroidism of renal origin: Secondary | ICD-10-CM | POA: Diagnosis not present

## 2018-02-02 DIAGNOSIS — E1121 Type 2 diabetes mellitus with diabetic nephropathy: Secondary | ICD-10-CM | POA: Diagnosis not present

## 2018-02-02 DIAGNOSIS — D631 Anemia in chronic kidney disease: Secondary | ICD-10-CM | POA: Diagnosis not present

## 2018-02-04 ENCOUNTER — Encounter: Payer: Self-pay | Admitting: Family Medicine

## 2018-02-04 ENCOUNTER — Ambulatory Visit (INDEPENDENT_AMBULATORY_CARE_PROVIDER_SITE_OTHER): Payer: Medicare Other | Admitting: Family Medicine

## 2018-02-04 ENCOUNTER — Other Ambulatory Visit: Payer: Self-pay

## 2018-02-04 ENCOUNTER — Ambulatory Visit (INDEPENDENT_AMBULATORY_CARE_PROVIDER_SITE_OTHER): Payer: Medicare Other | Admitting: Licensed Clinical Social Worker

## 2018-02-04 VITALS — BP 132/82 | HR 83 | Temp 98.8°F | Ht 71.5 in | Wt 258.6 lb

## 2018-02-04 DIAGNOSIS — Z905 Acquired absence of kidney: Secondary | ICD-10-CM

## 2018-02-04 DIAGNOSIS — E1121 Type 2 diabetes mellitus with diabetic nephropathy: Secondary | ICD-10-CM | POA: Diagnosis not present

## 2018-02-04 DIAGNOSIS — N186 End stage renal disease: Secondary | ICD-10-CM | POA: Diagnosis not present

## 2018-02-04 DIAGNOSIS — D631 Anemia in chronic kidney disease: Secondary | ICD-10-CM | POA: Diagnosis not present

## 2018-02-04 DIAGNOSIS — N2581 Secondary hyperparathyroidism of renal origin: Secondary | ICD-10-CM | POA: Diagnosis not present

## 2018-02-04 DIAGNOSIS — Z748 Other problems related to care provider dependency: Secondary | ICD-10-CM

## 2018-02-04 NOTE — Progress Notes (Signed)
Type of Service: [place] minutes Interpreter:No.   Alquan Morrish is a 52 y.o. male referred by Dr. Grandville Silos for assistance with assessing transportation needs Patient was accompanied by his daughter. Patient is pleasant and engaged in conversation. Reports the following concerns: dependent on daughter to transport him to dialysis MWF. Daughter takes patient at 6:00 and picks him up around 12:00 during her lunch time.  Daughter works full time and is requesting MD complete FMLA papers so that she can combine her lunch with an additional hour to pick up patient.  Reports doing this for 3 years. Current FMLA paper expire.  LCSW review transportation options with patient, he is not interested at this time.  Discussed backup plan with patient and daughter in the event daughter is unable to transport patient.  Update provided to Dr. Grandville Silos on request due to patient dependent on daughter to transport him to dialysis.   Casimer Lanius, LCSW Licensed Clinical Social Worker [facility]   [phone] 3:14 PM   Warm Hand Off Completed.

## 2018-02-06 DIAGNOSIS — D631 Anemia in chronic kidney disease: Secondary | ICD-10-CM | POA: Diagnosis not present

## 2018-02-06 DIAGNOSIS — E1121 Type 2 diabetes mellitus with diabetic nephropathy: Secondary | ICD-10-CM | POA: Diagnosis not present

## 2018-02-06 DIAGNOSIS — N186 End stage renal disease: Secondary | ICD-10-CM | POA: Diagnosis not present

## 2018-02-06 DIAGNOSIS — N2581 Secondary hyperparathyroidism of renal origin: Secondary | ICD-10-CM | POA: Diagnosis not present

## 2018-02-06 NOTE — Telephone Encounter (Signed)
-----   Message from Bonnita Hollow, MD sent at 02/05/2018  3:31 PM EDT ----- Please call patient and inform that FMLA paperwork is at front desk to be picked up at their convenience.

## 2018-02-06 NOTE — Telephone Encounter (Signed)
LM for patient letting him know that FMLA is ready for pick up . Dijon Kohlman,CMA

## 2018-02-06 NOTE — Progress Notes (Signed)
    Subjective:  Troy Wells is a 52 y.o. male who presents to the [facility] today to discuss FMLA paperwork.   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  This occurred in July.  Patient also dependent on hemodialysis.  He goes Monday Wednesday Friday.  His daughter has been taking him to his sessions.  She requires additional time away from work to be able to pick him up and drop them off as needed.  She is requesting FMLA paperwork for this purpose.   Objective:  Physical Exam: BP 132/82   Pulse 83   Temp 98.8 F (37.1 C) (Oral)   Ht 5' 11.5" (1.816 m)   Wt 258 lb 9.6 oz (117.3 kg)   SpO2 92%   BMI 35.56 kg/m     Assessment/Plan:  FMLA paperwork received will fill out per request of patient.  And return her.  Marny Lowenstein, MD, MS FAMILY MEDICINE RESIDENT - PGY2 02/06/2018 9:33 AM

## 2018-02-09 ENCOUNTER — Telehealth: Payer: Self-pay

## 2018-02-09 DIAGNOSIS — N2581 Secondary hyperparathyroidism of renal origin: Secondary | ICD-10-CM | POA: Diagnosis not present

## 2018-02-09 DIAGNOSIS — N186 End stage renal disease: Secondary | ICD-10-CM | POA: Diagnosis not present

## 2018-02-09 DIAGNOSIS — D631 Anemia in chronic kidney disease: Secondary | ICD-10-CM | POA: Diagnosis not present

## 2018-02-09 DIAGNOSIS — E1121 Type 2 diabetes mellitus with diabetic nephropathy: Secondary | ICD-10-CM | POA: Diagnosis not present

## 2018-02-09 NOTE — Telephone Encounter (Signed)
Attempted to call pt- had to leave a VM.

## 2018-02-09 NOTE — Telephone Encounter (Signed)
-----  Message from [name], MD sent at 02/09/2018  8:26 AM EDT ----- Patient was seen by Dr. Grandville Silos last Friday and he has an appointment with me tomorrow. Please check with patient regarding this appointment.  I will like him to come in to see me tomorrow since I had never met him before and there are a lot of care deficiency which I will like to discuss and update with him. Let me know what he decides regarding keeping this appointment. Thanks.

## 2018-02-10 ENCOUNTER — Ambulatory Visit: Payer: Medicare Other | Admitting: Family Medicine

## 2018-02-11 DIAGNOSIS — N186 End stage renal disease: Secondary | ICD-10-CM | POA: Diagnosis not present

## 2018-02-11 DIAGNOSIS — N2581 Secondary hyperparathyroidism of renal origin: Secondary | ICD-10-CM | POA: Diagnosis not present

## 2018-02-11 DIAGNOSIS — E1121 Type 2 diabetes mellitus with diabetic nephropathy: Secondary | ICD-10-CM | POA: Diagnosis not present

## 2018-02-11 DIAGNOSIS — D631 Anemia in chronic kidney disease: Secondary | ICD-10-CM | POA: Diagnosis not present

## 2018-02-13 DIAGNOSIS — N2581 Secondary hyperparathyroidism of renal origin: Secondary | ICD-10-CM | POA: Diagnosis not present

## 2018-02-13 DIAGNOSIS — D631 Anemia in chronic kidney disease: Secondary | ICD-10-CM | POA: Diagnosis not present

## 2018-02-13 DIAGNOSIS — N186 End stage renal disease: Secondary | ICD-10-CM | POA: Diagnosis not present

## 2018-02-13 DIAGNOSIS — E1121 Type 2 diabetes mellitus with diabetic nephropathy: Secondary | ICD-10-CM | POA: Diagnosis not present

## 2018-02-15 DIAGNOSIS — E1122 Type 2 diabetes mellitus with diabetic chronic kidney disease: Secondary | ICD-10-CM | POA: Diagnosis not present

## 2018-02-15 DIAGNOSIS — N186 End stage renal disease: Secondary | ICD-10-CM | POA: Diagnosis not present

## 2018-02-15 DIAGNOSIS — Z992 Dependence on renal dialysis: Secondary | ICD-10-CM | POA: Diagnosis not present

## 2018-02-16 DIAGNOSIS — N186 End stage renal disease: Secondary | ICD-10-CM | POA: Diagnosis not present

## 2018-02-16 DIAGNOSIS — D631 Anemia in chronic kidney disease: Secondary | ICD-10-CM | POA: Diagnosis not present

## 2018-02-16 DIAGNOSIS — E1121 Type 2 diabetes mellitus with diabetic nephropathy: Secondary | ICD-10-CM | POA: Diagnosis not present

## 2018-02-16 DIAGNOSIS — Z23 Encounter for immunization: Secondary | ICD-10-CM | POA: Diagnosis not present

## 2018-02-16 DIAGNOSIS — N2581 Secondary hyperparathyroidism of renal origin: Secondary | ICD-10-CM | POA: Diagnosis not present

## 2018-02-18 DIAGNOSIS — E1121 Type 2 diabetes mellitus with diabetic nephropathy: Secondary | ICD-10-CM | POA: Diagnosis not present

## 2018-02-18 DIAGNOSIS — Z23 Encounter for immunization: Secondary | ICD-10-CM | POA: Diagnosis not present

## 2018-02-18 DIAGNOSIS — D631 Anemia in chronic kidney disease: Secondary | ICD-10-CM | POA: Diagnosis not present

## 2018-02-18 DIAGNOSIS — N186 End stage renal disease: Secondary | ICD-10-CM | POA: Diagnosis not present

## 2018-02-18 DIAGNOSIS — N2581 Secondary hyperparathyroidism of renal origin: Secondary | ICD-10-CM | POA: Diagnosis not present

## 2018-02-20 DIAGNOSIS — D631 Anemia in chronic kidney disease: Secondary | ICD-10-CM | POA: Diagnosis not present

## 2018-02-20 DIAGNOSIS — N186 End stage renal disease: Secondary | ICD-10-CM | POA: Diagnosis not present

## 2018-02-20 DIAGNOSIS — N2581 Secondary hyperparathyroidism of renal origin: Secondary | ICD-10-CM | POA: Diagnosis not present

## 2018-02-20 DIAGNOSIS — E1121 Type 2 diabetes mellitus with diabetic nephropathy: Secondary | ICD-10-CM | POA: Diagnosis not present

## 2018-02-20 DIAGNOSIS — Z23 Encounter for immunization: Secondary | ICD-10-CM | POA: Diagnosis not present

## 2018-02-23 DIAGNOSIS — Z23 Encounter for immunization: Secondary | ICD-10-CM | POA: Diagnosis not present

## 2018-02-23 DIAGNOSIS — N186 End stage renal disease: Secondary | ICD-10-CM | POA: Diagnosis not present

## 2018-02-23 DIAGNOSIS — D631 Anemia in chronic kidney disease: Secondary | ICD-10-CM | POA: Diagnosis not present

## 2018-02-23 DIAGNOSIS — N2581 Secondary hyperparathyroidism of renal origin: Secondary | ICD-10-CM | POA: Diagnosis not present

## 2018-02-23 DIAGNOSIS — E1121 Type 2 diabetes mellitus with diabetic nephropathy: Secondary | ICD-10-CM | POA: Diagnosis not present

## 2018-02-25 DIAGNOSIS — N186 End stage renal disease: Secondary | ICD-10-CM | POA: Diagnosis not present

## 2018-02-25 DIAGNOSIS — Z23 Encounter for immunization: Secondary | ICD-10-CM | POA: Diagnosis not present

## 2018-02-25 DIAGNOSIS — N2581 Secondary hyperparathyroidism of renal origin: Secondary | ICD-10-CM | POA: Diagnosis not present

## 2018-02-25 DIAGNOSIS — D631 Anemia in chronic kidney disease: Secondary | ICD-10-CM | POA: Diagnosis not present

## 2018-02-25 DIAGNOSIS — E1121 Type 2 diabetes mellitus with diabetic nephropathy: Secondary | ICD-10-CM | POA: Diagnosis not present

## 2018-02-27 DIAGNOSIS — N186 End stage renal disease: Secondary | ICD-10-CM | POA: Diagnosis not present

## 2018-02-27 DIAGNOSIS — D631 Anemia in chronic kidney disease: Secondary | ICD-10-CM | POA: Diagnosis not present

## 2018-02-27 DIAGNOSIS — Z23 Encounter for immunization: Secondary | ICD-10-CM | POA: Diagnosis not present

## 2018-02-27 DIAGNOSIS — N2581 Secondary hyperparathyroidism of renal origin: Secondary | ICD-10-CM | POA: Diagnosis not present

## 2018-02-27 DIAGNOSIS — E1121 Type 2 diabetes mellitus with diabetic nephropathy: Secondary | ICD-10-CM | POA: Diagnosis not present

## 2018-03-02 DIAGNOSIS — N186 End stage renal disease: Secondary | ICD-10-CM | POA: Diagnosis not present

## 2018-03-02 DIAGNOSIS — N2581 Secondary hyperparathyroidism of renal origin: Secondary | ICD-10-CM | POA: Diagnosis not present

## 2018-03-02 DIAGNOSIS — D631 Anemia in chronic kidney disease: Secondary | ICD-10-CM | POA: Diagnosis not present

## 2018-03-02 DIAGNOSIS — E1121 Type 2 diabetes mellitus with diabetic nephropathy: Secondary | ICD-10-CM | POA: Diagnosis not present

## 2018-03-02 DIAGNOSIS — Z23 Encounter for immunization: Secondary | ICD-10-CM | POA: Diagnosis not present

## 2018-03-04 DIAGNOSIS — N2581 Secondary hyperparathyroidism of renal origin: Secondary | ICD-10-CM | POA: Diagnosis not present

## 2018-03-04 DIAGNOSIS — Z23 Encounter for immunization: Secondary | ICD-10-CM | POA: Diagnosis not present

## 2018-03-04 DIAGNOSIS — D631 Anemia in chronic kidney disease: Secondary | ICD-10-CM | POA: Diagnosis not present

## 2018-03-04 DIAGNOSIS — N186 End stage renal disease: Secondary | ICD-10-CM | POA: Diagnosis not present

## 2018-03-04 DIAGNOSIS — E1121 Type 2 diabetes mellitus with diabetic nephropathy: Secondary | ICD-10-CM | POA: Diagnosis not present

## 2018-03-06 DIAGNOSIS — E1121 Type 2 diabetes mellitus with diabetic nephropathy: Secondary | ICD-10-CM | POA: Diagnosis not present

## 2018-03-06 DIAGNOSIS — D631 Anemia in chronic kidney disease: Secondary | ICD-10-CM | POA: Diagnosis not present

## 2018-03-06 DIAGNOSIS — N186 End stage renal disease: Secondary | ICD-10-CM | POA: Diagnosis not present

## 2018-03-06 DIAGNOSIS — N2581 Secondary hyperparathyroidism of renal origin: Secondary | ICD-10-CM | POA: Diagnosis not present

## 2018-03-06 DIAGNOSIS — Z23 Encounter for immunization: Secondary | ICD-10-CM | POA: Diagnosis not present

## 2018-03-09 DIAGNOSIS — D631 Anemia in chronic kidney disease: Secondary | ICD-10-CM | POA: Diagnosis not present

## 2018-03-09 DIAGNOSIS — N2581 Secondary hyperparathyroidism of renal origin: Secondary | ICD-10-CM | POA: Diagnosis not present

## 2018-03-09 DIAGNOSIS — E1121 Type 2 diabetes mellitus with diabetic nephropathy: Secondary | ICD-10-CM | POA: Diagnosis not present

## 2018-03-09 DIAGNOSIS — Z23 Encounter for immunization: Secondary | ICD-10-CM | POA: Diagnosis not present

## 2018-03-09 DIAGNOSIS — N186 End stage renal disease: Secondary | ICD-10-CM | POA: Diagnosis not present

## 2018-03-11 DIAGNOSIS — N186 End stage renal disease: Secondary | ICD-10-CM | POA: Diagnosis not present

## 2018-03-11 DIAGNOSIS — N2581 Secondary hyperparathyroidism of renal origin: Secondary | ICD-10-CM | POA: Diagnosis not present

## 2018-03-11 DIAGNOSIS — D631 Anemia in chronic kidney disease: Secondary | ICD-10-CM | POA: Diagnosis not present

## 2018-03-11 DIAGNOSIS — E1121 Type 2 diabetes mellitus with diabetic nephropathy: Secondary | ICD-10-CM | POA: Diagnosis not present

## 2018-03-11 DIAGNOSIS — Z23 Encounter for immunization: Secondary | ICD-10-CM | POA: Diagnosis not present

## 2018-03-13 DIAGNOSIS — Z23 Encounter for immunization: Secondary | ICD-10-CM | POA: Diagnosis not present

## 2018-03-13 DIAGNOSIS — N186 End stage renal disease: Secondary | ICD-10-CM | POA: Diagnosis not present

## 2018-03-13 DIAGNOSIS — D631 Anemia in chronic kidney disease: Secondary | ICD-10-CM | POA: Diagnosis not present

## 2018-03-13 DIAGNOSIS — E1121 Type 2 diabetes mellitus with diabetic nephropathy: Secondary | ICD-10-CM | POA: Diagnosis not present

## 2018-03-13 DIAGNOSIS — N2581 Secondary hyperparathyroidism of renal origin: Secondary | ICD-10-CM | POA: Diagnosis not present

## 2018-03-16 DIAGNOSIS — N2581 Secondary hyperparathyroidism of renal origin: Secondary | ICD-10-CM | POA: Diagnosis not present

## 2018-03-16 DIAGNOSIS — N186 End stage renal disease: Secondary | ICD-10-CM | POA: Diagnosis not present

## 2018-03-16 DIAGNOSIS — Z23 Encounter for immunization: Secondary | ICD-10-CM | POA: Diagnosis not present

## 2018-03-16 DIAGNOSIS — D631 Anemia in chronic kidney disease: Secondary | ICD-10-CM | POA: Diagnosis not present

## 2018-03-16 DIAGNOSIS — E1121 Type 2 diabetes mellitus with diabetic nephropathy: Secondary | ICD-10-CM | POA: Diagnosis not present

## 2018-03-17 DIAGNOSIS — E1122 Type 2 diabetes mellitus with diabetic chronic kidney disease: Secondary | ICD-10-CM | POA: Diagnosis not present

## 2018-03-17 DIAGNOSIS — Z992 Dependence on renal dialysis: Secondary | ICD-10-CM | POA: Diagnosis not present

## 2018-03-17 DIAGNOSIS — N186 End stage renal disease: Secondary | ICD-10-CM | POA: Diagnosis not present

## 2018-03-18 DIAGNOSIS — E8770 Fluid overload, unspecified: Secondary | ICD-10-CM | POA: Diagnosis not present

## 2018-03-18 DIAGNOSIS — D631 Anemia in chronic kidney disease: Secondary | ICD-10-CM | POA: Diagnosis not present

## 2018-03-18 DIAGNOSIS — N186 End stage renal disease: Secondary | ICD-10-CM | POA: Diagnosis not present

## 2018-03-18 DIAGNOSIS — N2581 Secondary hyperparathyroidism of renal origin: Secondary | ICD-10-CM | POA: Diagnosis not present

## 2018-03-18 DIAGNOSIS — E1121 Type 2 diabetes mellitus with diabetic nephropathy: Secondary | ICD-10-CM | POA: Diagnosis not present

## 2018-03-20 DIAGNOSIS — N2581 Secondary hyperparathyroidism of renal origin: Secondary | ICD-10-CM | POA: Diagnosis not present

## 2018-03-20 DIAGNOSIS — E8770 Fluid overload, unspecified: Secondary | ICD-10-CM | POA: Diagnosis not present

## 2018-03-20 DIAGNOSIS — N186 End stage renal disease: Secondary | ICD-10-CM | POA: Diagnosis not present

## 2018-03-20 DIAGNOSIS — D631 Anemia in chronic kidney disease: Secondary | ICD-10-CM | POA: Diagnosis not present

## 2018-03-20 DIAGNOSIS — E1121 Type 2 diabetes mellitus with diabetic nephropathy: Secondary | ICD-10-CM | POA: Diagnosis not present

## 2018-03-23 DIAGNOSIS — D631 Anemia in chronic kidney disease: Secondary | ICD-10-CM | POA: Diagnosis not present

## 2018-03-23 DIAGNOSIS — E1121 Type 2 diabetes mellitus with diabetic nephropathy: Secondary | ICD-10-CM | POA: Diagnosis not present

## 2018-03-23 DIAGNOSIS — N186 End stage renal disease: Secondary | ICD-10-CM | POA: Diagnosis not present

## 2018-03-23 DIAGNOSIS — N2581 Secondary hyperparathyroidism of renal origin: Secondary | ICD-10-CM | POA: Diagnosis not present

## 2018-03-23 DIAGNOSIS — E8770 Fluid overload, unspecified: Secondary | ICD-10-CM | POA: Diagnosis not present

## 2018-03-25 DIAGNOSIS — E8770 Fluid overload, unspecified: Secondary | ICD-10-CM | POA: Diagnosis not present

## 2018-03-25 DIAGNOSIS — N186 End stage renal disease: Secondary | ICD-10-CM | POA: Diagnosis not present

## 2018-03-25 DIAGNOSIS — E1121 Type 2 diabetes mellitus with diabetic nephropathy: Secondary | ICD-10-CM | POA: Diagnosis not present

## 2018-03-25 DIAGNOSIS — D631 Anemia in chronic kidney disease: Secondary | ICD-10-CM | POA: Diagnosis not present

## 2018-03-25 DIAGNOSIS — N2581 Secondary hyperparathyroidism of renal origin: Secondary | ICD-10-CM | POA: Diagnosis not present

## 2018-03-27 DIAGNOSIS — E1121 Type 2 diabetes mellitus with diabetic nephropathy: Secondary | ICD-10-CM | POA: Diagnosis not present

## 2018-03-27 DIAGNOSIS — D631 Anemia in chronic kidney disease: Secondary | ICD-10-CM | POA: Diagnosis not present

## 2018-03-27 DIAGNOSIS — N2581 Secondary hyperparathyroidism of renal origin: Secondary | ICD-10-CM | POA: Diagnosis not present

## 2018-03-27 DIAGNOSIS — E8770 Fluid overload, unspecified: Secondary | ICD-10-CM | POA: Diagnosis not present

## 2018-03-27 DIAGNOSIS — N186 End stage renal disease: Secondary | ICD-10-CM | POA: Diagnosis not present

## 2018-03-30 DIAGNOSIS — E8770 Fluid overload, unspecified: Secondary | ICD-10-CM | POA: Diagnosis not present

## 2018-03-30 DIAGNOSIS — D631 Anemia in chronic kidney disease: Secondary | ICD-10-CM | POA: Diagnosis not present

## 2018-03-30 DIAGNOSIS — N186 End stage renal disease: Secondary | ICD-10-CM | POA: Diagnosis not present

## 2018-03-30 DIAGNOSIS — N2581 Secondary hyperparathyroidism of renal origin: Secondary | ICD-10-CM | POA: Diagnosis not present

## 2018-03-30 DIAGNOSIS — E1121 Type 2 diabetes mellitus with diabetic nephropathy: Secondary | ICD-10-CM | POA: Diagnosis not present

## 2018-04-01 ENCOUNTER — Other Ambulatory Visit: Payer: Self-pay | Admitting: *Deleted

## 2018-04-01 ENCOUNTER — Encounter (HOSPITAL_COMMUNITY): Payer: Self-pay | Admitting: *Deleted

## 2018-04-01 ENCOUNTER — Other Ambulatory Visit: Payer: Self-pay

## 2018-04-01 DIAGNOSIS — D631 Anemia in chronic kidney disease: Secondary | ICD-10-CM | POA: Diagnosis not present

## 2018-04-01 DIAGNOSIS — N2581 Secondary hyperparathyroidism of renal origin: Secondary | ICD-10-CM | POA: Diagnosis not present

## 2018-04-01 DIAGNOSIS — N186 End stage renal disease: Secondary | ICD-10-CM | POA: Diagnosis not present

## 2018-04-01 DIAGNOSIS — E8770 Fluid overload, unspecified: Secondary | ICD-10-CM | POA: Diagnosis not present

## 2018-04-01 DIAGNOSIS — E1121 Type 2 diabetes mellitus with diabetic nephropathy: Secondary | ICD-10-CM | POA: Diagnosis not present

## 2018-04-01 NOTE — Progress Notes (Addendum)
Anesthesia Chart Review: SAME DAY WORKUP   Case:  [id number] Date/Time:  04/02/18 0715   Procedure:  REVISION PLICATION OF ARTERIOVENOUS FISTULA ARM (Left )   Anesthesia type:  Choice   Pre-op diagnosis:  complication of fistula   Location:  [facility] ROOM 16 / [place] [facility]   Surgeon:  Waynetta Sandy, MD      DISCUSSION: 52 yo male former smoker. Pertinent hx includes DMII, CAD (NSTEMI 0160 complicated by Vfib arrest, cath at that time showed no culprit lesion but abnormal coronaries with aneurysms throughout), OSA on CPAP, HTN, S/p left nephrectomy for renal mass, ESRD on HD MWF.  Recent echo 08/2017 showed LVEF 55-60%, nl wall motion, mild LVH, mild MR.  Anticipate he can proceed as planned barring acute status change.  VS: There were no vitals taken for this visit.  PROVIDERS: [name], MD is PCP  Ena Dawley, MD, is Cardiologist last seen 10/03/2015  LABS: Will need DOS labs   IMAGES: N/A   EKG: 07/31/2017 (care everywhere, narrative only, tracing requested): Sinus rhythm. Rate 72. Left anterior fascicular block. Poor R wave progression, consider obesity, pulmonary disease, anterior infarct, or lead placement. Nonspecific T wave abnormality. When compared with ECG of 06-Nov-2015 09:42, Left anterior fascicular block now present. Right bundle branch block is no longer present. Septal infarct now present  CV: TTE 09/04/2017: SUMMARY Mild left ventricular hypertrophy The left ventricle is mildly dilated.  Left ventricular systolic function is normal. LV ejection fraction = 55-60%. No segmental wall motion abnormalities seen in the left ventricle The right ventricle is normal in size and function. The left atrium is mildly dilated. There is mild mitral regurgitation. The aortic root is normal size. IVC size was normal. There is trivial pericardial effusion. There is no comparison study available.  Dobutamine Stress Echo 09/04/2017: PROCEDURE The study used  ischemic protocol. The dobutamine test was stopped due to  exceeding target heart rate. The patient had no chest pain during stress.  Maximum dose of dobutamine infusion 30 mcg/kg/min. 1.2mg  Atropine. The  patient achieved 85 % of maximum predicted heart rate. Target heart rate (85%  of MPHR) was 142 . The maximal heart rate achieved was 144 bpm. Hypertension  at rest, and this increased with stress/. Metoprolol 2.5mg  was administered. - SUMMARY The patient had no chest pain during stress The patient achieved 85 % of maximum predicted heart rate. Negative stress ECG for inducible ischemia at target heart rate. The estimated LV ejection fraction is 50-55% . Inferior wall hypo/akinesis The estimated LV ejection fraction is 65-70% with stress. Normal augmentation of all LV walls except the inferior wall which remained  akinetic. No new wall motion abnormalities seen Negative dobutamine echocardiography for inducible ischemia at target heart  rate. - FINDINGS: - ECG REST The baseline ECG displays normal sinus rhythm. pvc. - ECG STRESS No diagnostic ST segment changes were seen. PVC's. PAC's. Negative stress ECG  for inducible ischemia at target heart rate. The stress protocol, dobutamine  perfusion time, ECG changes, blood pressure and heart rate responses are  shown in the Cardiology Scan portion of this report in EPIC. - REST ECHO The estimated LV ejection fraction is 50-55% . Inferior wall hypo/akinesis. - LOW DOSE Normal augmentation of all walls except the inferior wall which remain  hypo/akinetic. - PEAK DOSE The estimated LV ejection fraction is 65-70% with stress. Normal augmentation  of all LV walls except the inferior wall which remained akinetic. No new wall  motion  abnormalities seen. Negative dobutamine echocardiography for inducible  ischemia at target heart rate. - - WALL MOTION - Stress Results Protocol: DobutamineMaximum Predicted HR: 168 bpm Target HR:  143 bpm% Maximum Predicted HR: 86 %  Cath 09/15/2009: Impression: 1.  Abnormal coronary arteries with large vessels that have aneurysmal segments throughout.  No severe stenosis is identified.  No culprit vessels identified. 2.  No assessment of left ventricular function. Recommendations: This is a difficult case.  The patient presented with a ventricular fibrillation arrest and subsequently had an elevated troponin that was consistent with myocardial ischemia.  His coronary arteries were abnormal with aneurysmal segment throughout.  I do not see a discrete stenosis that would serve as a culprit lesion in this case.  Given his acute renal failure, we will hydrate him today; I will review the images with my colleagues.  Will consider as the patient has chest pain fixing a stenosis in the obtuse marginal branch.  We will consider IVUS of the mid LAD aneurysmal segment to make sure there is no large thrombus burden there.  Once again, the flow into the distal vessels is good at this time.  The patient will be taken to the holding area and transferred back to the surgical ICU.  Past Medical History:  Diagnosis Date  . Allergy   . Chronic kidney disease    Chronic Renal Insufficiency  . Coronary artery disease 08/15/2009   with AMI  . Diabetes mellitus without complication ([place])   . Dialysis patient [facility]) 09-27-2014   mon, wed, and fri  . Hyperlipidemia   . Hypertension   . Myocardial infarction ([place])    10 yrs ago per pt  . OSA treated with BiPAP 06/17/2010  . Sleep apnea    cpap nightly    Past Surgical History:  Procedure Laterality Date  . AV FISTULA PLACEMENT Left 09/27/2014  . NECK SURGERY     C6 & C7 replaced 30 yrs ago per pt    MEDICATIONS: No current facility-administered medications for this encounter.    Marland Kitchen acetaminophen (TYLENOL) 500 MG tablet  . allopurinol (ZYLOPRIM) 100 MG tablet  . amLODipine (NORVASC) 10 MG tablet  . aspirin 81 MG tablet  . atorvastatin (LIPITOR) 40 MG  tablet  . carvedilol (COREG) 25 MG tablet  . cetirizine (ZYRTEC) 5 MG tablet  . colchicine 0.6 MG tablet  . cyclobenzaprine (FLEXERIL) 10 MG tablet  . FOSRENOL 1000 MG chewable tablet  . gabapentin (NEURONTIN) 300 MG capsule  . glipiZIDE (GLUCOTROL) 5 MG tablet  . hydrocortisone cream 0.5 %  . minoxidil (LONITEN) 10 MG tablet  . Olopatadine HCl (PATADAY) 0.2 % SOLN  . predniSONE (DELTASONE) 10 MG tablet  . RENVELA 800 MG tablet  . SENSIPAR 30 MG tablet  . torsemide (DEMADEX) 100 MG tablet    [facility] Short Stay Center/Anesthesiology Phone [phone] 04/01/2018 3:09 PM

## 2018-04-01 NOTE — Progress Notes (Signed)
Pt denies SOB, chest pain, and being under the care of a cardiologist. Pt denies having a cardiac cath. Pt denies having a chest x ray within the last year. Pt made aware to stop taking vitamins, fish oil and herbal medications. Do not take any NSAIDs ie: Ibuprofen, Advil, Naproxen (Aleve), Motrin, BC and Goody Powder. Pt made aware to not take Glipizide at HS or DOS. Pt made aware to check BG every 2 hours prior to arrival to hospital on DOS. Pt made aware to treat a BG < 70 with  4 ounces of apple juice, wait 15 minutes after intervention to recheck BG, if BG remains < 70, call Short Stay unit to speak with a nurse. Pt verbalized understanding of all pre-op instructions. Anesthesia asked to review pt history.

## 2018-04-02 ENCOUNTER — Telehealth: Payer: Self-pay | Admitting: Vascular Surgery

## 2018-04-02 ENCOUNTER — Encounter: Payer: Medicare Other | Admitting: Vascular Surgery

## 2018-04-02 ENCOUNTER — Encounter (HOSPITAL_COMMUNITY): Payer: Self-pay | Admitting: *Deleted

## 2018-04-02 ENCOUNTER — Ambulatory Visit (HOSPITAL_COMMUNITY): Payer: Medicare Other

## 2018-04-02 ENCOUNTER — Encounter (HOSPITAL_COMMUNITY)
Admission: RE | Disposition: A | Discharge status: Home / Self Care | Payer: Self-pay | Source: Ambulatory Visit | Attending: Vascular Surgery

## 2018-04-02 ENCOUNTER — Ambulatory Visit (HOSPITAL_COMMUNITY): Payer: Medicare Other | Admitting: Physician Assistant

## 2018-04-02 ENCOUNTER — Ambulatory Visit (HOSPITAL_COMMUNITY)
Admission: RE | Admit: 2018-04-02 | Discharge: 2018-04-02 | Disposition: A | Discharge status: Home / Self Care | Payer: Medicare Other | Source: Ambulatory Visit | Attending: Vascular Surgery | Admitting: Vascular Surgery

## 2018-04-02 DIAGNOSIS — I132 Hypertensive heart and chronic kidney disease with heart failure and with stage 5 chronic kidney disease, or end stage renal disease: Secondary | ICD-10-CM | POA: Diagnosis not present

## 2018-04-02 DIAGNOSIS — Z87891 Personal history of nicotine dependence: Secondary | ICD-10-CM | POA: Insufficient documentation

## 2018-04-02 DIAGNOSIS — Z4682 Encounter for fitting and adjustment of non-vascular catheter: Secondary | ICD-10-CM | POA: Diagnosis not present

## 2018-04-02 DIAGNOSIS — Z7984 Long term (current) use of oral hypoglycemic drugs: Secondary | ICD-10-CM | POA: Diagnosis not present

## 2018-04-02 DIAGNOSIS — I120 Hypertensive chronic kidney disease with stage 5 chronic kidney disease or end stage renal disease: Secondary | ICD-10-CM | POA: Diagnosis not present

## 2018-04-02 DIAGNOSIS — E785 Hyperlipidemia, unspecified: Secondary | ICD-10-CM | POA: Diagnosis not present

## 2018-04-02 DIAGNOSIS — I252 Old myocardial infarction: Secondary | ICD-10-CM | POA: Diagnosis not present

## 2018-04-02 DIAGNOSIS — Z992 Dependence on renal dialysis: Secondary | ICD-10-CM | POA: Insufficient documentation

## 2018-04-02 DIAGNOSIS — G4733 Obstructive sleep apnea (adult) (pediatric): Secondary | ICD-10-CM | POA: Insufficient documentation

## 2018-04-02 DIAGNOSIS — Z7982 Long term (current) use of aspirin: Secondary | ICD-10-CM | POA: Diagnosis not present

## 2018-04-02 DIAGNOSIS — Z95828 Presence of other vascular implants and grafts: Secondary | ICD-10-CM

## 2018-04-02 DIAGNOSIS — I509 Heart failure, unspecified: Secondary | ICD-10-CM | POA: Diagnosis not present

## 2018-04-02 DIAGNOSIS — N186 End stage renal disease: Secondary | ICD-10-CM | POA: Diagnosis not present

## 2018-04-02 DIAGNOSIS — T82898A Other specified complication of vascular prosthetic devices, implants and grafts, initial encounter: Secondary | ICD-10-CM | POA: Diagnosis not present

## 2018-04-02 DIAGNOSIS — E1122 Type 2 diabetes mellitus with diabetic chronic kidney disease: Secondary | ICD-10-CM | POA: Insufficient documentation

## 2018-04-02 HISTORY — DX: Presence of dental prosthetic device (complete) (partial): Z97.2

## 2018-04-02 HISTORY — DX: Presence of spectacles and contact lenses: Z97.3

## 2018-04-02 HISTORY — DX: Cardiac murmur, unspecified: R01.1

## 2018-04-02 HISTORY — PX: REVISON OF ARTERIOVENOUS FISTULA: SHX6074

## 2018-04-02 HISTORY — PX: INSERTION OF DIALYSIS CATHETER: SHX1324

## 2018-04-02 HISTORY — PX: INSERTION OF ARTERIOVENOUS (AV) ARTEGRAFT ARM: SHX6779

## 2018-04-02 LAB — GLUCOSE, CAPILLARY
Glucose-Capillary: 103 mg/dL — ABNORMAL HIGH (ref 70–99)
Glucose-Capillary: 120 mg/dL — ABNORMAL HIGH (ref 70–99)

## 2018-04-02 LAB — POCT I-STAT 4, (NA,K, GLUC, HGB,HCT)
Glucose, Bld: 99 mg/dL (ref 70–99)
HCT: 31 % — ABNORMAL LOW (ref 39.0–52.0)
Hemoglobin: 10.5 g/dL — ABNORMAL LOW (ref 13.0–17.0)
Potassium: 4.5 mmol/L (ref 3.5–5.1)
Sodium: 140 mmol/L (ref 135–145)

## 2018-04-02 SURGERY — REVISON OF ARTERIOVENOUS FISTULA
Anesthesia: General | Site: Chest

## 2018-04-02 MED ORDER — HYDROCODONE-ACETAMINOPHEN 7.5-325 MG PO TABS
ORAL_TABLET | ORAL | Status: AC
  Filled 2018-04-02: qty 1

## 2018-04-02 MED ORDER — SODIUM CHLORIDE 0.9 % IV SOLN
INTRAVENOUS | Status: DC
  Administered 2018-04-02 (×2): via INTRAVENOUS

## 2018-04-02 MED ORDER — PHENYLEPHRINE 40 MCG/ML (10ML) SYRINGE FOR IV PUSH (FOR BLOOD PRESSURE SUPPORT)
PREFILLED_SYRINGE | INTRAVENOUS | Status: AC
  Filled 2018-04-02: qty 10

## 2018-04-02 MED ORDER — EPHEDRINE SULFATE-NACL 50-0.9 MG/10ML-% IV SOSY
PREFILLED_SYRINGE | INTRAVENOUS | Status: DC | PRN
  Administered 2018-04-02: 10 mg via INTRAVENOUS
  Administered 2018-04-02: 5 mg via INTRAVENOUS

## 2018-04-02 MED ORDER — HYDROCODONE-ACETAMINOPHEN 7.5-325 MG PO TABS
1.0000 | ORAL_TABLET | Freq: Once | ORAL | Status: AC | PRN
  Administered 2018-04-02: 1 via ORAL

## 2018-04-02 MED ORDER — PROTAMINE SULFATE 10 MG/ML IV SOLN
INTRAVENOUS | Status: DC | PRN
  Administered 2018-04-02: 10 mg via INTRAVENOUS
  Administered 2018-04-02: 40 mg via INTRAVENOUS

## 2018-04-02 MED ORDER — FENTANYL CITRATE (PF) 100 MCG/2ML IJ SOLN: 25.0000 ug | INTRAMUSCULAR | Status: DC | PRN

## 2018-04-02 MED ORDER — 0.9 % SODIUM CHLORIDE (POUR BTL) OPTIME
TOPICAL | Status: DC | PRN
  Administered 2018-04-02: 1000 mL

## 2018-04-02 MED ORDER — SODIUM CHLORIDE 0.9 % IV SOLN
INTRAVENOUS | Status: DC | PRN
  Administered 2018-04-02: 08:00:00

## 2018-04-02 MED ORDER — LIDOCAINE 2% (20 MG/ML) 5 ML SYRINGE
INTRAMUSCULAR | Status: DC | PRN
  Administered 2018-04-02: 60 mg via INTRAVENOUS

## 2018-04-02 MED ORDER — ONDANSETRON HCL 4 MG/2ML IJ SOLN
INTRAMUSCULAR | Status: AC
  Filled 2018-04-02: qty 2

## 2018-04-02 MED ORDER — CEFAZOLIN SODIUM-DEXTROSE 2-4 GM/100ML-% IV SOLN
2.0000 g | INTRAVENOUS | Status: AC
  Administered 2018-04-02: 2 g via INTRAVENOUS

## 2018-04-02 MED ORDER — MIDAZOLAM HCL 2 MG/2ML IJ SOLN
INTRAMUSCULAR | Status: AC
  Filled 2018-04-02: qty 2

## 2018-04-02 MED ORDER — LIDOCAINE HCL (PF) 1 % IJ SOLN
INTRAMUSCULAR | Status: AC
  Filled 2018-04-02: qty 30

## 2018-04-02 MED ORDER — PROPOFOL 10 MG/ML IV BOLUS
INTRAVENOUS | Status: DC | PRN
  Administered 2018-04-02: 180 mg via INTRAVENOUS

## 2018-04-02 MED ORDER — HEPARIN SODIUM (PORCINE) 10000 UNIT/ML IJ SOLN
INTRAMUSCULAR | Status: DC | PRN
  Administered 2018-04-02: 3 mL via SUBCUTANEOUS

## 2018-04-02 MED ORDER — FENTANYL CITRATE (PF) 250 MCG/5ML IJ SOLN
INTRAMUSCULAR | Status: AC
  Filled 2018-04-02: qty 5

## 2018-04-02 MED ORDER — MIDAZOLAM HCL 2 MG/2ML IJ SOLN
INTRAMUSCULAR | Status: DC | PRN
  Administered 2018-04-02: 1 mg via INTRAVENOUS

## 2018-04-02 MED ORDER — GLYCOPYRROLATE 0.2 MG/ML IJ SOLN
INTRAMUSCULAR | Status: DC | PRN
  Administered 2018-04-02: .8 mg via INTRAVENOUS

## 2018-04-02 MED ORDER — ONDANSETRON HCL 4 MG/2ML IJ SOLN
INTRAMUSCULAR | Status: DC | PRN
  Administered 2018-04-02: 4 mg via INTRAVENOUS

## 2018-04-02 MED ORDER — SODIUM CHLORIDE 0.9 % IV SOLN
INTRAVENOUS | Status: DC | PRN
  Administered 2018-04-02: 40 ug/min via INTRAVENOUS

## 2018-04-02 MED ORDER — ROCURONIUM BROMIDE 100 MG/10ML IV SOLN: INTRAVENOUS | Status: DC | PRN

## 2018-04-02 MED ORDER — FENTANYL CITRATE (PF) 250 MCG/5ML IJ SOLN
INTRAMUSCULAR | Status: DC | PRN
  Administered 2018-04-02: 50 ug via INTRAVENOUS
  Administered 2018-04-02: 100 ug via INTRAVENOUS

## 2018-04-02 MED ORDER — HEPARIN SODIUM (PORCINE) 1000 UNIT/ML IJ SOLN
INTRAMUSCULAR | Status: AC
  Filled 2018-04-02: qty 1

## 2018-04-02 MED ORDER — NEOSTIGMINE METHYLSULFATE 10 MG/10ML IV SOLN
INTRAVENOUS | Status: DC | PRN
  Administered 2018-04-02: 5 mg via INTRAVENOUS

## 2018-04-02 MED ORDER — PHENYLEPHRINE 40 MCG/ML (10ML) SYRINGE FOR IV PUSH (FOR BLOOD PRESSURE SUPPORT)
PREFILLED_SYRINGE | INTRAVENOUS | Status: DC | PRN
  Administered 2018-04-02 (×2): 80 ug via INTRAVENOUS

## 2018-04-02 MED ORDER — SODIUM CHLORIDE 0.9 % IV SOLN
INTRAVENOUS | Status: AC
  Filled 2018-04-02: qty 1.2

## 2018-04-02 MED ORDER — ROCURONIUM BROMIDE 100 MG/10ML IV SOLN
INTRAVENOUS | Status: DC | PRN
  Administered 2018-04-02 (×3): 10 mg via INTRAVENOUS
  Administered 2018-04-02: 30 mg via INTRAVENOUS

## 2018-04-02 MED ORDER — ONDANSETRON HCL 4 MG/2ML IJ SOLN: 4.0000 mg | Freq: Once | INTRAMUSCULAR | Status: DC | PRN

## 2018-04-02 MED ORDER — OXYCODONE-ACETAMINOPHEN 5-325 MG PO TABS: 1.0000 | ORAL_TABLET | Freq: Four times a day (QID) | ORAL | 0 refills | Status: DC | PRN

## 2018-04-02 MED ORDER — HEPARIN SODIUM (PORCINE) 1000 UNIT/ML IJ SOLN
INTRAMUSCULAR | Status: DC | PRN
  Administered 2018-04-02: 6000 [IU] via INTRAVENOUS

## 2018-04-02 MED ORDER — ALBUTEROL SULFATE HFA 108 (90 BASE) MCG/ACT IN AERS
INHALATION_SPRAY | RESPIRATORY_TRACT | Status: DC | PRN
  Administered 2018-04-02: 8 via RESPIRATORY_TRACT

## 2018-04-02 MED ORDER — SUCCINYLCHOLINE CHLORIDE 20 MG/ML IJ SOLN
INTRAMUSCULAR | Status: DC | PRN
  Administered 2018-04-02: 140 mg via INTRAVENOUS

## 2018-04-02 MED ORDER — EPHEDRINE 5 MG/ML INJ
INTRAVENOUS | Status: AC
  Filled 2018-04-02: qty 10

## 2018-04-02 MED ORDER — DEXAMETHASONE SODIUM PHOSPHATE 10 MG/ML IJ SOLN
INTRAMUSCULAR | Status: DC | PRN
  Administered 2018-04-02: 10 mg via INTRAVENOUS

## 2018-04-02 SURGICAL SUPPLY — 55 items
ADH SKN CLS APL DERMABOND .7 (GAUZE/BANDAGES/DRESSINGS) ×4
ADH SKN CLS LQ APL DERMABOND (GAUZE/BANDAGES/DRESSINGS) ×2
ARMBAND PINK RESTRICT EXTREMIT (MISCELLANEOUS) ×4 IMPLANT
BAG BANDED W/RUBBER/TAPE 36X54 (MISCELLANEOUS) ×2 IMPLANT
BAG EQP BAND 135X91 W/RBR TAPE (MISCELLANEOUS) ×2
BANDAGE ACE 4X5 VEL STRL LF (GAUZE/BANDAGES/DRESSINGS) ×2 IMPLANT
BANDAGE ELASTIC 4 VELCRO ST LF (GAUZE/BANDAGES/DRESSINGS) ×2 IMPLANT
CANISTER SUCT 3000ML PPV (MISCELLANEOUS) ×4 IMPLANT
CATH PALINDROME RT-P 15FX19CM (CATHETERS) ×2 IMPLANT
CLIP VESOCCLUDE MED 6/CT (CLIP) ×4 IMPLANT
CLIP VESOCCLUDE SM WIDE 6/CT (CLIP) ×4 IMPLANT
COVER DOME SNAP 22 D (MISCELLANEOUS) ×2 IMPLANT
COVER PROBE W GEL 5X96 (DRAPES) ×6 IMPLANT
COVER WAND RF STERILE (DRAPES) ×4 IMPLANT
DERMABOND ADHESIVE PROPEN (GAUZE/BANDAGES/DRESSINGS) ×2
DERMABOND ADVANCED (GAUZE/BANDAGES/DRESSINGS) ×4
DERMABOND ADVANCED .7 DNX12 (GAUZE/BANDAGES/DRESSINGS) ×2 IMPLANT
DERMABOND ADVANCED .7 DNX6 (GAUZE/BANDAGES/DRESSINGS) IMPLANT
DRAPE CHEST BREAST 15X10 FENES (DRAPES) ×2 IMPLANT
ELECT REM PT RETURN 9FT ADLT (ELECTROSURGICAL) ×4
ELECTRODE REM PT RTRN 9FT ADLT (ELECTROSURGICAL) ×2 IMPLANT
GAUZE SPONGE 4X4 12PLY STRL (GAUZE/BANDAGES/DRESSINGS) ×2 IMPLANT
GLOVE BIO SURGEON STRL SZ 6.5 (GLOVE) ×3 IMPLANT
GLOVE BIO SURGEON STRL SZ7.5 (GLOVE) ×6 IMPLANT
GLOVE BIO SURGEONS STRL SZ 6.5 (GLOVE) ×3
GLOVE BIOGEL PI IND STRL 6.5 (GLOVE) IMPLANT
GLOVE BIOGEL PI IND STRL 7.0 (GLOVE) IMPLANT
GLOVE BIOGEL PI INDICATOR 6.5 (GLOVE) ×4
GLOVE BIOGEL PI INDICATOR 7.0 (GLOVE) ×2
GLOVE ECLIPSE 7.5 STRL STRAW (GLOVE) ×6 IMPLANT
GOWN STRL REUS W/ TWL LRG LVL3 (GOWN DISPOSABLE) ×4 IMPLANT
GOWN STRL REUS W/ TWL XL LVL3 (GOWN DISPOSABLE) ×2 IMPLANT
GOWN STRL REUS W/TWL LRG LVL3 (GOWN DISPOSABLE) ×8
GOWN STRL REUS W/TWL XL LVL3 (GOWN DISPOSABLE) ×16
GRAFT COLLAGEN VASCULAR 6X15 (Vascular Products) ×2 IMPLANT
GRAFT COLLAGEN VASCULAR 6X19 (Vascular Products) IMPLANT
KIT BASIN OR (CUSTOM PROCEDURE TRAY) ×4 IMPLANT
KIT TURNOVER KIT B (KITS) ×4 IMPLANT
NS IRRIG 1000ML POUR BTL (IV SOLUTION) ×4 IMPLANT
PACK CV ACCESS (CUSTOM PROCEDURE TRAY) ×4 IMPLANT
PAD ARMBOARD 7.5X6 YLW CONV (MISCELLANEOUS) ×8 IMPLANT
SLEEVE SURGEON STRL (DRAPES) ×2 IMPLANT
SPONGE LAP 18X18 RF (DISPOSABLE) ×2 IMPLANT
STAPLER VISISTAT 35W (STAPLE) ×2 IMPLANT
SUT ETHILON 3 0 PS 1 (SUTURE) ×4 IMPLANT
SUT MNCRL AB 4-0 PS2 18 (SUTURE) ×6 IMPLANT
SUT PROLENE 5 0 C 1 24 (SUTURE) ×6 IMPLANT
SUT PROLENE 6 0 BV (SUTURE) ×8 IMPLANT
SUT VIC AB 2-0 CT1 27 (SUTURE) ×8
SUT VIC AB 2-0 CT1 TAPERPNT 27 (SUTURE) IMPLANT
SUT VIC AB 3-0 SH 27 (SUTURE) ×8
SUT VIC AB 3-0 SH 27X BRD (SUTURE) ×2 IMPLANT
TOWEL GREEN STERILE (TOWEL DISPOSABLE) ×4 IMPLANT
UNDERPAD 30X30 (UNDERPADS AND DIAPERS) ×4 IMPLANT
WATER STERILE IRR 1000ML POUR (IV SOLUTION) ×4 IMPLANT

## 2018-04-02 NOTE — Telephone Encounter (Signed)
-----   Message from Gabriel Earing, [place] sent at 04/02/2018  9:51 AM EDT ----- Insertion of LUA artergraft and ligation of previous fistula 04/02/18.  F/u with Dr. Donzetta Matters or PA on Dr. Claretha Cooper clinic day.   Thanks

## 2018-04-02 NOTE — Discharge Instructions (Signed)
° °  [facility]  Discharge Instructions  AV Fistula or Graft Surgery for Dialysis Access  Please refer to the following instructions for your post-procedure care. Your surgeon or physician assistant will discuss any changes with you.  Activity  You may drive the day following your surgery, if you are comfortable and no longer taking prescription pain medication. Resume full activity as the soreness in your incision resolves.  Bathing/Showering  You may shower after you go home. Keep your incision dry for 48 hours. Do not soak in a bathtub, hot tub, or swim until the incision heals completely. You may not shower if you have a hemodialysis catheter.  Incision Care  Clean your incision with mild soap and water after 48 hours. Pat the area dry with a clean towel. You do not need a bandage unless otherwise instructed. Do not apply any ointments or creams to your incision. You may have skin glue on your incision. Do not peel it off. It will come off on its own in about one week. Your arm may swell a bit after surgery. To reduce swelling use pillows to elevate your arm so it is above your heart. Your doctor will tell you if you need to lightly wrap your arm with an ACE bandage.  Diet  Resume your normal diet. There are not special food restrictions following this procedure. In order to heal from your surgery, it is CRITICAL to get adequate nutrition. Your body requires vitamins, minerals, and protein. Vegetables are the best source of vitamins and minerals. Vegetables also provide the perfect balance of protein. Processed food has little nutritional value, so try to avoid this.  Medications  Resume taking all of your medications. If your incision is causing pain, you may take over-the counter pain relievers such as acetaminophen (Tylenol). If you were prescribed a stronger pain medication, please be aware these medications can cause nausea and constipation. Prevent  nausea by taking the medication with a snack or meal. Avoid constipation by drinking plenty of fluids and eating foods with high amount of fiber, such as fruits, vegetables, and grains.  Do not take Tylenol if you are taking prescription pain medications.  Follow up Your surgeon may want to see you in the office following your access surgery. If so, this will be arranged at the time of your surgery.  Please call us immediately for any of the following conditions:  Increased pain, redness, drainage (pus) from your incision site Fever of 101 degrees or higher Severe or worsening pain at your incision site Hand pain or numbness.  Reduce your risk of vascular disease:  Stop smoking. If you would like help, call [facility] at 1-800-QUIT-NOW 2364085038) or [place] [facility] [place] your cholesterol Maintain a desired weight Control your diabetes Keep your blood pressure down  Dialysis  It will take several weeks to several months for your new dialysis access to be ready for use. Your surgeon will determine when it is okay to use it. Your nephrologist will continue to direct your dialysis. You can continue to use your Permcath until your new access is ready for use.   04/02/2018 Troy Wells [id number] 22-May-1966  Surgeon(s): Waynetta Sandy, MD  Procedure(s):  INSERTION OF ARTERIOVENOUS (AV) ARTEGRAFT ARM LEFT UPPER ARM  x Do not stick graft for 4 weeks    If you have any questions, please call the office at [phone].

## 2018-04-02 NOTE — Anesthesia Procedure Notes (Signed)
Procedure Name: Intubation Date/Time: 04/02/2018 8:00 AM Performed by: Bryson Corona, CRNA Pre-anesthesia Checklist: Patient identified, Emergency Drugs available, Suction available and Patient being monitored Patient Re-evaluated:Patient Re-evaluated prior to induction Oxygen Delivery Method: Circle System Utilized Preoxygenation: Pre-oxygenation with 100% oxygen Induction Type: IV induction Ventilation: Two handed mask ventilation required and Oral airway inserted - appropriate to patient size Laryngoscope Size: Mac and 4 Grade View: Grade I Tube type: Oral Tube size: 7.0 mm Number of attempts: 1 Airway Equipment and Method: Stylet and Oral airway Placement Confirmation: ETT inserted through vocal cords under direct vision,  positive ETCO2 and breath sounds checked- equal and bilateral Secured at: 22 cm Tube secured with: Tape Dental Injury: Teeth and Oropharynx as per pre-operative assessment

## 2018-04-02 NOTE — H&P (Signed)
HP History of Present Illness: This is a 52 y.o. male with esrd. Has 2 areas of ulceration that have not bled.  Past Medical History:  Diagnosis Date  . Allergy   . Chronic kidney disease    Chronic Renal Insufficiency  . Coronary artery disease 08/15/2009   with AMI  . Diabetes mellitus without complication ([place])   . Dialysis patient [facility]) 09-27-2014   mon, wed, and fri  . Heart murmur   . Hyperlipidemia   . Hypertension   . Myocardial infarction ([place])    10 yrs ago per pt  . OSA treated with BiPAP 06/17/2010  . Sleep apnea    cpap nightly  . Wears glasses   . Wears partial dentures     Past Surgical History:  Procedure Laterality Date  . AV FISTULA PLACEMENT Left 09/27/2014  . NECK SURGERY     C6 & C7 replaced 30 yrs ago per pt  . WISDOM TOOTH EXTRACTION      No Known Allergies  Prior to Admission medications   Medication Sig Start Date End Date Taking? Authorizing Provider  acetaminophen (TYLENOL) 500 MG tablet Take 500 mg by mouth every 6 (six) hours as needed for mild pain or moderate pain.   Yes [provider]  allopurinol (ZYLOPRIM) 100 MG tablet Take 100 mg by mouth daily. 10/04/14  Yes [provider]  amLODipine (NORVASC) 10 MG tablet Take 10 mg by mouth daily.   Yes [provider]  carvedilol (COREG) 25 MG tablet Take 1 tablet (25 mg total) by mouth 2 (two) times daily with a meal. 08/31/15  Yes [name], MD  gabapentin (NEURONTIN) 300 MG capsule TAKE ONE CAPSULE BY MOUTH THREE TIMES DAILY 09/30/16  Yes [name], MD  glipiZIDE (GLUCOTROL) 5 MG tablet Take 5 mg by mouth daily. 10/04/14  Yes [provider]  hydrocortisone cream 0.5 % Apply 1 application topically 2 (two) times daily. 11/19/17  Yes Sela Hilding, MD  minoxidil (LONITEN) 10 MG tablet Take 2 tablets (20 mg total) by mouth 2 (two) times daily. 09/11/17  Yes Zenia Resides, MD  RENVELA 800 MG tablet  12/16/15  Yes [provider]  SENSIPAR 30 MG  tablet  12/01/15  Yes [provider]  torsemide (DEMADEX) 100 MG tablet Take 100 mg by mouth daily. Take on Tuesday, Thursday, Saturday, and Sunday   Yes [provider]  aspirin 81 MG tablet Take 81 mg by mouth daily. Reported on 01/02/2016    [provider]  atorvastatin (LIPITOR) 40 MG tablet TAKE ONE TABLET BY MOUTH ONCE DAILY 03/12/16   [name], MD  cetirizine (ZYRTEC) 5 MG tablet Take 1 tablet (5 mg total) by mouth daily. 11/25/16   [name], MD  colchicine 0.6 MG tablet Take 0.6 mg by mouth 2 (two) times daily. 12/11/10   [provider]  cyclobenzaprine (FLEXERIL) 10 MG tablet Take 1 tablet (10 mg total) by mouth 2 (two) times daily as needed for muscle spasms. 12/24/15   Ward, Ozella Almond, PA-C  FOSRENOL 1000 MG chewable tablet  12/06/15   [provider]  Olopatadine HCl (PATADAY) 0.2 % SOLN One drop to each eye once daily 11/25/16   [name], MD  predniSONE (DELTASONE) 10 MG tablet  12/10/15   [provider]    Social History   Socioeconomic History  . Marital status: Single    Spouse name: Not on file  . Number  of children: Not on file  . Years of education: Not on file  . Highest education level: Not on file  Occupational History  . Not on file  Social Needs  . Financial resource strain: Not on file  . Food insecurity:    Worry: Not on file    Inability: Not on file  . Transportation needs:    Medical: Not on file    Non-medical: Not on file  Tobacco Use  . Smoking status: Former Smoker    Packs/day: 1.00    Years: 10.00    Pack years: 10.00    Types: Cigars    Last attempt to quit: 06/18/2011    Years since quitting: 6.7  . Smokeless tobacco: Never Used  Substance and Sexual Activity  . Alcohol use: Yes    Alcohol/week: 1.0 standard drinks    Types: 1 Standard drinks or equivalent per week    Comment: beer or liquor occasionally  . Drug use: No  . Sexual activity: Yes  Lifestyle  . Physical  activity:    Days per week: Not on file    Minutes per session: Not on file  . Stress: Not on file  Relationships  . Social connections:    Talks on phone: Not on file    Gets together: Not on file    Attends religious service: Not on file    Active member of club or organization: Not on file    Attends meetings of clubs or organizations: Not on file    Relationship status: Not on file  . Intimate partner violence:    Fear of current or ex partner: Not on file    Emotionally abused: Not on file    Physically abused: Not on file    Forced sexual activity: Not on file  Other Topics Concern  . Not on file  Social History Narrative   Updated 08/31/15 by Dr. Dossie Arbour    Currently Unemployed. On Disability.   Lives with daughter, grandson, and nephew     Family History  Problem Relation Age of Onset  . Hypertension Mother   . Heart disease Mother 67       cause of death  . Hypertension Father   . Aneurysm Father 38       brain aneurysm  . Heart disease Father 76       cause of death  . Hypertension Brother   . Alcohol abuse Brother   . Diabetes Brother   . Pancreatitis Brother   . Hypertension Brother   . Colon cancer Neg Hx     ROS: no new issues  Physical Examination  Vitals:   04/02/18 0612  BP: (!) 151/71  Pulse: 68  Resp: 18  Temp: 97.9 F (36.6 C)  SpO2: 95%   Body mass index is 36.96 kg/m.  General:  nad HENT: WNL, normocephalic Pulmonary: normal non-labored breathing Cardiac: palpable left radial pulse Extremities: 2 areas of ulceration left arm avf Neurologic: A&O X 3; Appropriate Affect ; SENSATION: normal; MOTOR FUNCTION:  moving all extremities equally. Speech is fluent/normal   CBC    Component Value Date/Time   WBC 7.0 08/31/2015 1041   RBC 4.68 08/31/2015 1041   HGB 10.5 (L) 04/02/2018 0646   HCT 31.0 (L) 04/02/2018 0646   PLT 240 08/31/2015 1041   MCV 76.9 (L) 08/31/2015 1041   MCH 25.6 (L) 08/31/2015 1041   MCHC 33.3  08/31/2015 1041   RDW 19.4 (H) 08/31/2015 1041  BMET    Component Value Date/Time   NA 140 04/02/2018 0646   K 4.5 04/02/2018 0646   CL 94 (L) 08/31/2015 1041   CO2 28 08/31/2015 1041   GLUCOSE 99 04/02/2018 0646   BUN 63 (H) 08/31/2015 1041   CREATININE 9.06 (H) 08/31/2015 1041   CALCIUM 9.7 08/31/2015 1041   GFRNONAA 6 (L) 08/31/2015 1041   GFRAA 7 (L) 08/31/2015 1041    COAGS: Lab Results  Component Value Date   INR 1.08 09/13/2009     ASSESSMENT/PLAN: This is a 52 y.o. male with ulcerated avf in 2 areas. Plan for revision of av fistula and possible tdc placement.   Demont Linford C. Donzetta Matters, MD [facility] [place] Office: [phone] Pager: [phone]

## 2018-04-02 NOTE — Op Note (Signed)
Patient name: Troy Wells MRN: [id number] DOB: 07-30-1965 Sex: male  04/02/2018 Pre-operative Diagnosis: End-stage renal disease, ulceration of left arm AV fistula in 2 areas Post-operative diagnosis:  Same Surgeon:  Eda Paschal. Donzetta Matters, MD Assistant: Leontine Locket, PA Procedure Performed: 1.  Revision of left arm AV fistula with interposition Artegraft 2.  Placement of right IJ tunneled dialysis catheter 19 cm in length with ultrasound guidance  Indications: 52 year old male with a history of a left arm AV fistula that he has used for dialysis.  He presented to our office with 2 areas of pseudoaneurysmal degeneration and both had scabs overlying them.  He was indicated for revision possible catheter placement.  Findings: Fistula itself was directly below the incision.  Given the multiple areas of pseudoaneurysmal degeneration there was no feasible way to salvage the fistula and an interposition Artegraft was tunneled around the incision sites and the incisions were primarily closed with staples.   Procedure:  The patient was identified in the holding area and taken to the operating room where is placed supine the operative table and general anesthesia was induced.  He was sterilely prepped and draped in the left upper extremity given antibiotics and a timeout was called.  We began with dissecting out the proximal fistula near the arterial anastomosis through the existing incision.  We are able to get a vessel loop around this.  We then turned our attention distally we made a similar incision along the previous incision line.  We were able to get a vessel loop around that and the patient was given 6000 units of heparin.  We then began to make elliptical incisions around our 2 areas of pseudoaneurysmal degeneration but unfortunately these were densely adherent to the skin.  Became quite evident that this was not going to be a salvageable fistula.  I then transected the fistula proximally and  distally.  A 7 mm piece of Artegraft was brought to the table.  I did bevel significantly sewed and and to the vein near the axilla.  I then marked for orientation and tunneled it towards the arterial anastomosis.  There I trimmed to size and beveled it and also beveled the inflow.  These were also sewn end-to-end with 5-0 Prolene suture.  Prior to completion of this anastomosis we did flush irrigate in all directions with heparinized saline.  Upon completion there is a strong thrill in the renal vein confirmed with Doppler and a palpable radial pulse also confirmed with Doppler.  50 mg of protamine was administered.  We then closed the deep tissue after irrigating the wound obtained hemostasis with 2-0 Vicryl suture.  Staples were placed to level the skin and a loose Ace wrap was placed.    We then turned our attention to the neck and chest to place a tunneled catheter.  This area was sterilely prepped and draped and another timeout called.  The ultrasound was used to identify the internal jugular vein which was noted to be patent and compressible was cannulated with an 18-gauge needle and a wire was placed centrally under fluoroscopic guidance.  A counterincision was made and a 19 cm catheter was tunneled from the size and assembled.  The wire tract was then serially dilated and introducer sheath was placed.  The catheter was then placed to the SVC-atrial junction.  Flushed with heparinized saline affixed to skin with 3-0 nylon suture and the neck incision was closed with 4-0 Monocryl.  The catheter was locked with 1.5 cc of  concentrated heparin.  He was then allowed away from anesthesia having tolerated procedure well without immediate complication.  All counts were correct at completion of both procedures.  Next  EBL 50 cc.   Julyana Woolverton C. Donzetta Matters, MD [facility] [place] Office: [phone] Pager: [phone]

## 2018-04-02 NOTE — Transfer of Care (Signed)
Immediate Anesthesia Transfer of Care Note  Patient: Troy Wells  Procedure(s) Performed: REVISION PLICATION OF ARTERIOVENOUS FISTULA ARM (Left Arm Upper) INSERTION OF ARTERIOVENOUS (AV) ARTEGRAFT ARM LEFT UPPER ARM (Left Arm Upper) INSERTION OF DIALYSIS CATHETER, right internal jugular (N/A Chest)  Patient Location: PACU  Anesthesia Type:General  Level of Consciousness: drowsy  Airway & Oxygen Therapy: Patient Spontanous Breathing and Patient connected to face mask oxygen  Post-op Assessment: Report given to RN and Post -op Vital signs reviewed and stable  Post vital signs: Reviewed and stable  Last Vitals:  Vitals Value Taken Time  BP 140/86 04/02/2018 10:40 AM  Temp    Pulse 86 04/02/2018 10:40 AM  Resp 18 04/02/2018 10:40 AM  SpO2 96 % 04/02/2018 10:40 AM  Vitals shown include unvalidated device data.  Last Pain:  Vitals:   04/02/18 0659  TempSrc:   PainSc: 0-No pain      Patients Stated Pain Goal: 1 (04/15/12 1438)  Complications: No apparent anesthesia complications

## 2018-04-02 NOTE — Telephone Encounter (Signed)
sch appt lvm 04/24/18 2pm p/o PA

## 2018-04-02 NOTE — Anesthesia Postprocedure Evaluation (Signed)
Anesthesia Post Note  Patient: Troy Wells  Procedure(s) Performed: REVISION PLICATION OF ARTERIOVENOUS FISTULA ARM (Left Arm Upper) INSERTION OF ARTERIOVENOUS (AV) ARTEGRAFT ARM LEFT UPPER ARM (Left Arm Upper) INSERTION OF DIALYSIS CATHETER, right internal jugular (N/A Chest)     Patient location during evaluation: PACU Anesthesia Type: General Level of consciousness: awake and alert Pain management: pain level controlled Vital Signs Assessment: post-procedure vital signs reviewed and stable Respiratory status: spontaneous breathing, nonlabored ventilation, respiratory function stable and patient connected to nasal cannula oxygen Cardiovascular status: blood pressure returned to baseline and stable Postop Assessment: no apparent nausea or vomiting Anesthetic complications: no    Last Vitals:  Vitals:   04/02/18 1204 04/02/18 1214  BP: 131/69 120/63  Pulse: 70 67  Resp: 15 16  Temp: (!) 36.4 C   SpO2: 99% 93%    Last Pain:  Vitals:   04/02/18 1214  TempSrc:   PainSc: 3                  Evyn Kooyman COKER

## 2018-04-02 NOTE — Anesthesia Preprocedure Evaluation (Signed)
Anesthesia Evaluation  Patient identified by MRN, date of birth, ID band Patient awake    Reviewed: Allergy & Precautions, NPO status , Patient's Chart, lab work & pertinent test results  Airway Mallampati: II  TM Distance: >3 FB     Dental  (+) Teeth Intact, Dental Advisory Given,    Pulmonary former smoker,    breath sounds clear to auscultation       Cardiovascular hypertension,  Rhythm:Regular Rate:Normal     Neuro/Psych    GI/Hepatic   Endo/Other  diabetes  Renal/GU      Musculoskeletal   Abdominal   Peds  Hematology   Anesthesia Other Findings   Reproductive/Obstetrics                             Anesthesia Physical Anesthesia Plan  ASA: III  Anesthesia Plan: General   Post-op Pain Management:    Induction: Intravenous  PONV Risk Score and Plan: Ondansetron  Airway Management Planned: LMA  Additional Equipment:   Intra-op Plan:   Post-operative Plan:   Informed Consent: I have reviewed the patients History and Physical, chart, labs and discussed the procedure including the risks, benefits and alternatives for the proposed anesthesia with the patient or authorized representative who has indicated his/her understanding and acceptance.   Dental advisory given  Plan Discussed with: CRNA and Anesthesiologist  Anesthesia Plan Comments:         Anesthesia Quick Evaluation

## 2018-04-03 ENCOUNTER — Encounter (HOSPITAL_COMMUNITY): Payer: Self-pay | Admitting: Vascular Surgery

## 2018-04-03 DIAGNOSIS — E1121 Type 2 diabetes mellitus with diabetic nephropathy: Secondary | ICD-10-CM | POA: Diagnosis not present

## 2018-04-03 DIAGNOSIS — D631 Anemia in chronic kidney disease: Secondary | ICD-10-CM | POA: Diagnosis not present

## 2018-04-03 DIAGNOSIS — N2581 Secondary hyperparathyroidism of renal origin: Secondary | ICD-10-CM | POA: Diagnosis not present

## 2018-04-03 DIAGNOSIS — E8770 Fluid overload, unspecified: Secondary | ICD-10-CM | POA: Diagnosis not present

## 2018-04-03 DIAGNOSIS — N186 End stage renal disease: Secondary | ICD-10-CM | POA: Diagnosis not present

## 2018-04-04 DIAGNOSIS — N186 End stage renal disease: Secondary | ICD-10-CM | POA: Diagnosis not present

## 2018-04-04 DIAGNOSIS — N2581 Secondary hyperparathyroidism of renal origin: Secondary | ICD-10-CM | POA: Diagnosis not present

## 2018-04-04 DIAGNOSIS — D631 Anemia in chronic kidney disease: Secondary | ICD-10-CM | POA: Diagnosis not present

## 2018-04-04 DIAGNOSIS — E8770 Fluid overload, unspecified: Secondary | ICD-10-CM | POA: Diagnosis not present

## 2018-04-04 DIAGNOSIS — E1121 Type 2 diabetes mellitus with diabetic nephropathy: Secondary | ICD-10-CM | POA: Diagnosis not present

## 2018-04-06 DIAGNOSIS — N186 End stage renal disease: Secondary | ICD-10-CM | POA: Diagnosis not present

## 2018-04-06 DIAGNOSIS — D631 Anemia in chronic kidney disease: Secondary | ICD-10-CM | POA: Diagnosis not present

## 2018-04-06 DIAGNOSIS — N2581 Secondary hyperparathyroidism of renal origin: Secondary | ICD-10-CM | POA: Diagnosis not present

## 2018-04-06 DIAGNOSIS — E8770 Fluid overload, unspecified: Secondary | ICD-10-CM | POA: Diagnosis not present

## 2018-04-06 DIAGNOSIS — E1121 Type 2 diabetes mellitus with diabetic nephropathy: Secondary | ICD-10-CM | POA: Diagnosis not present

## 2018-04-08 DIAGNOSIS — N2581 Secondary hyperparathyroidism of renal origin: Secondary | ICD-10-CM | POA: Diagnosis not present

## 2018-04-08 DIAGNOSIS — D631 Anemia in chronic kidney disease: Secondary | ICD-10-CM | POA: Diagnosis not present

## 2018-04-08 DIAGNOSIS — E8770 Fluid overload, unspecified: Secondary | ICD-10-CM | POA: Diagnosis not present

## 2018-04-08 DIAGNOSIS — N186 End stage renal disease: Secondary | ICD-10-CM | POA: Diagnosis not present

## 2018-04-08 DIAGNOSIS — E1121 Type 2 diabetes mellitus with diabetic nephropathy: Secondary | ICD-10-CM | POA: Diagnosis not present

## 2018-04-10 ENCOUNTER — Telehealth: Payer: Self-pay | Admitting: Vascular Surgery

## 2018-04-10 DIAGNOSIS — E1121 Type 2 diabetes mellitus with diabetic nephropathy: Secondary | ICD-10-CM | POA: Diagnosis not present

## 2018-04-10 DIAGNOSIS — D631 Anemia in chronic kidney disease: Secondary | ICD-10-CM | POA: Diagnosis not present

## 2018-04-10 DIAGNOSIS — N2581 Secondary hyperparathyroidism of renal origin: Secondary | ICD-10-CM | POA: Diagnosis not present

## 2018-04-10 DIAGNOSIS — E8770 Fluid overload, unspecified: Secondary | ICD-10-CM | POA: Diagnosis not present

## 2018-04-10 DIAGNOSIS — N186 End stage renal disease: Secondary | ICD-10-CM | POA: Diagnosis not present

## 2018-04-13 DIAGNOSIS — N186 End stage renal disease: Secondary | ICD-10-CM | POA: Diagnosis not present

## 2018-04-13 DIAGNOSIS — E8770 Fluid overload, unspecified: Secondary | ICD-10-CM | POA: Diagnosis not present

## 2018-04-13 DIAGNOSIS — N2581 Secondary hyperparathyroidism of renal origin: Secondary | ICD-10-CM | POA: Diagnosis not present

## 2018-04-13 DIAGNOSIS — D631 Anemia in chronic kidney disease: Secondary | ICD-10-CM | POA: Diagnosis not present

## 2018-04-13 DIAGNOSIS — E1121 Type 2 diabetes mellitus with diabetic nephropathy: Secondary | ICD-10-CM | POA: Diagnosis not present

## 2018-04-13 NOTE — Telephone Encounter (Signed)
Note opened in error.

## 2018-04-15 DIAGNOSIS — D631 Anemia in chronic kidney disease: Secondary | ICD-10-CM | POA: Diagnosis not present

## 2018-04-15 DIAGNOSIS — E8770 Fluid overload, unspecified: Secondary | ICD-10-CM | POA: Diagnosis not present

## 2018-04-15 DIAGNOSIS — E1121 Type 2 diabetes mellitus with diabetic nephropathy: Secondary | ICD-10-CM | POA: Diagnosis not present

## 2018-04-15 DIAGNOSIS — N2581 Secondary hyperparathyroidism of renal origin: Secondary | ICD-10-CM | POA: Diagnosis not present

## 2018-04-15 DIAGNOSIS — N186 End stage renal disease: Secondary | ICD-10-CM | POA: Diagnosis not present

## 2018-04-17 DIAGNOSIS — N2581 Secondary hyperparathyroidism of renal origin: Secondary | ICD-10-CM | POA: Diagnosis not present

## 2018-04-17 DIAGNOSIS — E1122 Type 2 diabetes mellitus with diabetic chronic kidney disease: Secondary | ICD-10-CM | POA: Diagnosis not present

## 2018-04-17 DIAGNOSIS — Z992 Dependence on renal dialysis: Secondary | ICD-10-CM | POA: Diagnosis not present

## 2018-04-17 DIAGNOSIS — D631 Anemia in chronic kidney disease: Secondary | ICD-10-CM | POA: Diagnosis not present

## 2018-04-17 DIAGNOSIS — E1121 Type 2 diabetes mellitus with diabetic nephropathy: Secondary | ICD-10-CM | POA: Diagnosis not present

## 2018-04-17 DIAGNOSIS — N186 End stage renal disease: Secondary | ICD-10-CM | POA: Diagnosis not present

## 2018-04-20 DIAGNOSIS — E1121 Type 2 diabetes mellitus with diabetic nephropathy: Secondary | ICD-10-CM | POA: Diagnosis not present

## 2018-04-20 DIAGNOSIS — N186 End stage renal disease: Secondary | ICD-10-CM | POA: Diagnosis not present

## 2018-04-20 DIAGNOSIS — D631 Anemia in chronic kidney disease: Secondary | ICD-10-CM | POA: Diagnosis not present

## 2018-04-20 DIAGNOSIS — N2581 Secondary hyperparathyroidism of renal origin: Secondary | ICD-10-CM | POA: Diagnosis not present

## 2018-04-22 DIAGNOSIS — D631 Anemia in chronic kidney disease: Secondary | ICD-10-CM | POA: Diagnosis not present

## 2018-04-22 DIAGNOSIS — N186 End stage renal disease: Secondary | ICD-10-CM | POA: Diagnosis not present

## 2018-04-22 DIAGNOSIS — E1121 Type 2 diabetes mellitus with diabetic nephropathy: Secondary | ICD-10-CM | POA: Diagnosis not present

## 2018-04-22 DIAGNOSIS — N2581 Secondary hyperparathyroidism of renal origin: Secondary | ICD-10-CM | POA: Diagnosis not present

## 2018-04-24 ENCOUNTER — Ambulatory Visit (INDEPENDENT_AMBULATORY_CARE_PROVIDER_SITE_OTHER): Payer: Self-pay | Admitting: Physician Assistant

## 2018-04-24 ENCOUNTER — Other Ambulatory Visit: Payer: Self-pay

## 2018-04-24 VITALS — BP 132/73 | HR 85 | Temp 98.1°F | Resp 16 | Ht 71.0 in | Wt 264.0 lb

## 2018-04-24 DIAGNOSIS — N2581 Secondary hyperparathyroidism of renal origin: Secondary | ICD-10-CM | POA: Diagnosis not present

## 2018-04-24 DIAGNOSIS — N186 End stage renal disease: Secondary | ICD-10-CM | POA: Diagnosis not present

## 2018-04-24 DIAGNOSIS — Z992 Dependence on renal dialysis: Secondary | ICD-10-CM

## 2018-04-24 DIAGNOSIS — E1121 Type 2 diabetes mellitus with diabetic nephropathy: Secondary | ICD-10-CM | POA: Diagnosis not present

## 2018-04-24 DIAGNOSIS — D631 Anemia in chronic kidney disease: Secondary | ICD-10-CM | POA: Diagnosis not present

## 2018-04-24 NOTE — Progress Notes (Signed)
    Postoperative Access Visit   History of Present Illness   Troy Wells is a 52 y.o. year old male who presents for postoperative follow-up for left arm fistula revision with placement of tunneled dialysis catheter by Dr. Donzetta Matters on 04/02/2018 due to multiple areas of pseudoaneurysmal degeneration.  The patient's wounds are healed.  The patient denies steal symptoms.  The patient is  able to complete their activities of daily living.  He is dialyzing without complication via tunneled dialysis catheter on a Tuesday Thursday Saturday schedule under the care of Dr. Joelyn Oms.  Patient states he does have some swelling in the area of his incisions however denies any drainage, erythema, fevers, chills, nausea/vomiting.  Physical Examination   Vitals:   04/24/18 1355  BP: 132/73  Pulse: 85  Resp: 16  Temp: 98.1 F (36.7 C)  TempSrc: Oral  SpO2: 97%  Weight: 264 lb (119.7 kg)  Height: 5\' 11"  (1.803 m)   Body mass index is 36.82 kg/m.  left arm Incisions healed with staples in place, hand grip is 5/5, sensation in digits is intact, palpable thrill, bruit can be auscultated; palpable fluid collection between staples and upper arm with no sign of erythema or infection; palpable left radial pulse    Medical Decision Making   Troy Wells is a 52 y.o. year old male who presents s/p left arm fistula revision with interposition Artegraft due to multiple areas of pseudoaneurysmal degeneration   All staples were removed from the incisions of the left arm today  The patient's access will be ready for use 05/14/2018.  Continue HD via right IJ [facility] for now.  The tunneled dialysis catheter can be removed when nephrology is comfortable with the performance of the newly revised left arm fistula   He may follow-up on an as-needed basis   Troy Ligas PA-C [facility] [place] Office: [phone]

## 2018-04-27 DIAGNOSIS — D631 Anemia in chronic kidney disease: Secondary | ICD-10-CM | POA: Diagnosis not present

## 2018-04-27 DIAGNOSIS — E1121 Type 2 diabetes mellitus with diabetic nephropathy: Secondary | ICD-10-CM | POA: Diagnosis not present

## 2018-04-27 DIAGNOSIS — N2581 Secondary hyperparathyroidism of renal origin: Secondary | ICD-10-CM | POA: Diagnosis not present

## 2018-04-27 DIAGNOSIS — N186 End stage renal disease: Secondary | ICD-10-CM | POA: Diagnosis not present

## 2018-04-27 MED ORDER — SUGAMMADEX SODIUM 500 MG/5ML IV SOLN
INTRAVENOUS | Status: DC | PRN
  Administered 2018-04-02: 245 mg via INTRAVENOUS

## 2018-04-27 NOTE — Addendum Note (Signed)
Addendum  created 04/27/18 1454 by Bryson Corona, CRNA   Intraprocedure Meds edited

## 2018-04-29 DIAGNOSIS — D631 Anemia in chronic kidney disease: Secondary | ICD-10-CM | POA: Diagnosis not present

## 2018-04-29 DIAGNOSIS — N2581 Secondary hyperparathyroidism of renal origin: Secondary | ICD-10-CM | POA: Diagnosis not present

## 2018-04-29 DIAGNOSIS — E1121 Type 2 diabetes mellitus with diabetic nephropathy: Secondary | ICD-10-CM | POA: Diagnosis not present

## 2018-04-29 DIAGNOSIS — N186 End stage renal disease: Secondary | ICD-10-CM | POA: Diagnosis not present

## 2018-05-01 DIAGNOSIS — N186 End stage renal disease: Secondary | ICD-10-CM | POA: Diagnosis not present

## 2018-05-01 DIAGNOSIS — N2581 Secondary hyperparathyroidism of renal origin: Secondary | ICD-10-CM | POA: Diagnosis not present

## 2018-05-01 DIAGNOSIS — D631 Anemia in chronic kidney disease: Secondary | ICD-10-CM | POA: Diagnosis not present

## 2018-05-01 DIAGNOSIS — E1121 Type 2 diabetes mellitus with diabetic nephropathy: Secondary | ICD-10-CM | POA: Diagnosis not present

## 2018-05-04 DIAGNOSIS — N186 End stage renal disease: Secondary | ICD-10-CM | POA: Diagnosis not present

## 2018-05-04 DIAGNOSIS — D631 Anemia in chronic kidney disease: Secondary | ICD-10-CM | POA: Diagnosis not present

## 2018-05-04 DIAGNOSIS — E1121 Type 2 diabetes mellitus with diabetic nephropathy: Secondary | ICD-10-CM | POA: Diagnosis not present

## 2018-05-04 DIAGNOSIS — N2581 Secondary hyperparathyroidism of renal origin: Secondary | ICD-10-CM | POA: Diagnosis not present

## 2018-05-06 DIAGNOSIS — N2581 Secondary hyperparathyroidism of renal origin: Secondary | ICD-10-CM | POA: Diagnosis not present

## 2018-05-06 DIAGNOSIS — D631 Anemia in chronic kidney disease: Secondary | ICD-10-CM | POA: Diagnosis not present

## 2018-05-06 DIAGNOSIS — E1121 Type 2 diabetes mellitus with diabetic nephropathy: Secondary | ICD-10-CM | POA: Diagnosis not present

## 2018-05-06 DIAGNOSIS — N186 End stage renal disease: Secondary | ICD-10-CM | POA: Diagnosis not present

## 2018-05-08 DIAGNOSIS — N2581 Secondary hyperparathyroidism of renal origin: Secondary | ICD-10-CM | POA: Diagnosis not present

## 2018-05-08 DIAGNOSIS — N186 End stage renal disease: Secondary | ICD-10-CM | POA: Diagnosis not present

## 2018-05-08 DIAGNOSIS — E1121 Type 2 diabetes mellitus with diabetic nephropathy: Secondary | ICD-10-CM | POA: Diagnosis not present

## 2018-05-08 DIAGNOSIS — D631 Anemia in chronic kidney disease: Secondary | ICD-10-CM | POA: Diagnosis not present

## 2018-05-10 DIAGNOSIS — N186 End stage renal disease: Secondary | ICD-10-CM | POA: Diagnosis not present

## 2018-05-10 DIAGNOSIS — N2581 Secondary hyperparathyroidism of renal origin: Secondary | ICD-10-CM | POA: Diagnosis not present

## 2018-05-10 DIAGNOSIS — E1121 Type 2 diabetes mellitus with diabetic nephropathy: Secondary | ICD-10-CM | POA: Diagnosis not present

## 2018-05-10 DIAGNOSIS — D631 Anemia in chronic kidney disease: Secondary | ICD-10-CM | POA: Diagnosis not present

## 2018-05-12 DIAGNOSIS — N186 End stage renal disease: Secondary | ICD-10-CM | POA: Diagnosis not present

## 2018-05-12 DIAGNOSIS — D631 Anemia in chronic kidney disease: Secondary | ICD-10-CM | POA: Diagnosis not present

## 2018-05-12 DIAGNOSIS — E1121 Type 2 diabetes mellitus with diabetic nephropathy: Secondary | ICD-10-CM | POA: Diagnosis not present

## 2018-05-12 DIAGNOSIS — N2581 Secondary hyperparathyroidism of renal origin: Secondary | ICD-10-CM | POA: Diagnosis not present

## 2018-05-15 DIAGNOSIS — E1121 Type 2 diabetes mellitus with diabetic nephropathy: Secondary | ICD-10-CM | POA: Diagnosis not present

## 2018-05-15 DIAGNOSIS — D631 Anemia in chronic kidney disease: Secondary | ICD-10-CM | POA: Diagnosis not present

## 2018-05-15 DIAGNOSIS — N2581 Secondary hyperparathyroidism of renal origin: Secondary | ICD-10-CM | POA: Diagnosis not present

## 2018-05-15 DIAGNOSIS — N186 End stage renal disease: Secondary | ICD-10-CM | POA: Diagnosis not present

## 2018-05-17 DIAGNOSIS — E1122 Type 2 diabetes mellitus with diabetic chronic kidney disease: Secondary | ICD-10-CM | POA: Diagnosis not present

## 2018-05-17 DIAGNOSIS — Z992 Dependence on renal dialysis: Secondary | ICD-10-CM | POA: Diagnosis not present

## 2018-05-17 DIAGNOSIS — N186 End stage renal disease: Secondary | ICD-10-CM | POA: Diagnosis not present

## 2018-05-18 DIAGNOSIS — D631 Anemia in chronic kidney disease: Secondary | ICD-10-CM | POA: Diagnosis not present

## 2018-05-18 DIAGNOSIS — E1121 Type 2 diabetes mellitus with diabetic nephropathy: Secondary | ICD-10-CM | POA: Diagnosis not present

## 2018-05-18 DIAGNOSIS — N186 End stage renal disease: Secondary | ICD-10-CM | POA: Diagnosis not present

## 2018-05-18 DIAGNOSIS — N2581 Secondary hyperparathyroidism of renal origin: Secondary | ICD-10-CM | POA: Diagnosis not present

## 2018-05-20 DIAGNOSIS — E1121 Type 2 diabetes mellitus with diabetic nephropathy: Secondary | ICD-10-CM | POA: Diagnosis not present

## 2018-05-20 DIAGNOSIS — N186 End stage renal disease: Secondary | ICD-10-CM | POA: Diagnosis not present

## 2018-05-20 DIAGNOSIS — D631 Anemia in chronic kidney disease: Secondary | ICD-10-CM | POA: Diagnosis not present

## 2018-05-20 DIAGNOSIS — N2581 Secondary hyperparathyroidism of renal origin: Secondary | ICD-10-CM | POA: Diagnosis not present

## 2018-05-22 DIAGNOSIS — N2581 Secondary hyperparathyroidism of renal origin: Secondary | ICD-10-CM | POA: Diagnosis not present

## 2018-05-22 DIAGNOSIS — N186 End stage renal disease: Secondary | ICD-10-CM | POA: Diagnosis not present

## 2018-05-22 DIAGNOSIS — E1121 Type 2 diabetes mellitus with diabetic nephropathy: Secondary | ICD-10-CM | POA: Diagnosis not present

## 2018-05-22 DIAGNOSIS — D631 Anemia in chronic kidney disease: Secondary | ICD-10-CM | POA: Diagnosis not present

## 2018-05-25 DIAGNOSIS — E1121 Type 2 diabetes mellitus with diabetic nephropathy: Secondary | ICD-10-CM | POA: Diagnosis not present

## 2018-05-25 DIAGNOSIS — N186 End stage renal disease: Secondary | ICD-10-CM | POA: Diagnosis not present

## 2018-05-25 DIAGNOSIS — D631 Anemia in chronic kidney disease: Secondary | ICD-10-CM | POA: Diagnosis not present

## 2018-05-25 DIAGNOSIS — N2581 Secondary hyperparathyroidism of renal origin: Secondary | ICD-10-CM | POA: Diagnosis not present

## 2018-05-27 DIAGNOSIS — D631 Anemia in chronic kidney disease: Secondary | ICD-10-CM | POA: Diagnosis not present

## 2018-05-27 DIAGNOSIS — E1121 Type 2 diabetes mellitus with diabetic nephropathy: Secondary | ICD-10-CM | POA: Diagnosis not present

## 2018-05-27 DIAGNOSIS — N2581 Secondary hyperparathyroidism of renal origin: Secondary | ICD-10-CM | POA: Diagnosis not present

## 2018-05-27 DIAGNOSIS — N186 End stage renal disease: Secondary | ICD-10-CM | POA: Diagnosis not present

## 2018-05-29 DIAGNOSIS — N186 End stage renal disease: Secondary | ICD-10-CM | POA: Diagnosis not present

## 2018-05-29 DIAGNOSIS — D631 Anemia in chronic kidney disease: Secondary | ICD-10-CM | POA: Diagnosis not present

## 2018-05-29 DIAGNOSIS — E1121 Type 2 diabetes mellitus with diabetic nephropathy: Secondary | ICD-10-CM | POA: Diagnosis not present

## 2018-05-29 DIAGNOSIS — N2581 Secondary hyperparathyroidism of renal origin: Secondary | ICD-10-CM | POA: Diagnosis not present

## 2018-06-01 DIAGNOSIS — N2581 Secondary hyperparathyroidism of renal origin: Secondary | ICD-10-CM | POA: Diagnosis not present

## 2018-06-01 DIAGNOSIS — E1121 Type 2 diabetes mellitus with diabetic nephropathy: Secondary | ICD-10-CM | POA: Diagnosis not present

## 2018-06-01 DIAGNOSIS — N186 End stage renal disease: Secondary | ICD-10-CM | POA: Diagnosis not present

## 2018-06-01 DIAGNOSIS — D631 Anemia in chronic kidney disease: Secondary | ICD-10-CM | POA: Diagnosis not present

## 2018-06-03 DIAGNOSIS — E1121 Type 2 diabetes mellitus with diabetic nephropathy: Secondary | ICD-10-CM | POA: Diagnosis not present

## 2018-06-03 DIAGNOSIS — D631 Anemia in chronic kidney disease: Secondary | ICD-10-CM | POA: Diagnosis not present

## 2018-06-03 DIAGNOSIS — N2581 Secondary hyperparathyroidism of renal origin: Secondary | ICD-10-CM | POA: Diagnosis not present

## 2018-06-03 DIAGNOSIS — N186 End stage renal disease: Secondary | ICD-10-CM | POA: Diagnosis not present

## 2018-06-05 DIAGNOSIS — N2581 Secondary hyperparathyroidism of renal origin: Secondary | ICD-10-CM | POA: Diagnosis not present

## 2018-06-05 DIAGNOSIS — N186 End stage renal disease: Secondary | ICD-10-CM | POA: Diagnosis not present

## 2018-06-05 DIAGNOSIS — D631 Anemia in chronic kidney disease: Secondary | ICD-10-CM | POA: Diagnosis not present

## 2018-06-05 DIAGNOSIS — E1121 Type 2 diabetes mellitus with diabetic nephropathy: Secondary | ICD-10-CM | POA: Diagnosis not present

## 2018-06-07 DIAGNOSIS — D631 Anemia in chronic kidney disease: Secondary | ICD-10-CM | POA: Diagnosis not present

## 2018-06-07 DIAGNOSIS — N186 End stage renal disease: Secondary | ICD-10-CM | POA: Diagnosis not present

## 2018-06-07 DIAGNOSIS — N2581 Secondary hyperparathyroidism of renal origin: Secondary | ICD-10-CM | POA: Diagnosis not present

## 2018-06-07 DIAGNOSIS — E1121 Type 2 diabetes mellitus with diabetic nephropathy: Secondary | ICD-10-CM | POA: Diagnosis not present

## 2018-06-09 DIAGNOSIS — N186 End stage renal disease: Secondary | ICD-10-CM | POA: Diagnosis not present

## 2018-06-09 DIAGNOSIS — E1121 Type 2 diabetes mellitus with diabetic nephropathy: Secondary | ICD-10-CM | POA: Diagnosis not present

## 2018-06-09 DIAGNOSIS — N2581 Secondary hyperparathyroidism of renal origin: Secondary | ICD-10-CM | POA: Diagnosis not present

## 2018-06-09 DIAGNOSIS — D631 Anemia in chronic kidney disease: Secondary | ICD-10-CM | POA: Diagnosis not present

## 2018-06-12 DIAGNOSIS — N186 End stage renal disease: Secondary | ICD-10-CM | POA: Diagnosis not present

## 2018-06-12 DIAGNOSIS — E1121 Type 2 diabetes mellitus with diabetic nephropathy: Secondary | ICD-10-CM | POA: Diagnosis not present

## 2018-06-12 DIAGNOSIS — N2581 Secondary hyperparathyroidism of renal origin: Secondary | ICD-10-CM | POA: Diagnosis not present

## 2018-06-12 DIAGNOSIS — D631 Anemia in chronic kidney disease: Secondary | ICD-10-CM | POA: Diagnosis not present

## 2018-06-14 DIAGNOSIS — D631 Anemia in chronic kidney disease: Secondary | ICD-10-CM | POA: Diagnosis not present

## 2018-06-14 DIAGNOSIS — E1121 Type 2 diabetes mellitus with diabetic nephropathy: Secondary | ICD-10-CM | POA: Diagnosis not present

## 2018-06-14 DIAGNOSIS — N2581 Secondary hyperparathyroidism of renal origin: Secondary | ICD-10-CM | POA: Diagnosis not present

## 2018-06-14 DIAGNOSIS — N186 End stage renal disease: Secondary | ICD-10-CM | POA: Diagnosis not present

## 2018-06-16 DIAGNOSIS — N186 End stage renal disease: Secondary | ICD-10-CM | POA: Diagnosis not present

## 2018-06-16 DIAGNOSIS — D631 Anemia in chronic kidney disease: Secondary | ICD-10-CM | POA: Diagnosis not present

## 2018-06-16 DIAGNOSIS — E1121 Type 2 diabetes mellitus with diabetic nephropathy: Secondary | ICD-10-CM | POA: Diagnosis not present

## 2018-06-16 DIAGNOSIS — N2581 Secondary hyperparathyroidism of renal origin: Secondary | ICD-10-CM | POA: Diagnosis not present

## 2018-06-17 DIAGNOSIS — E1122 Type 2 diabetes mellitus with diabetic chronic kidney disease: Secondary | ICD-10-CM | POA: Diagnosis not present

## 2018-06-17 DIAGNOSIS — Z992 Dependence on renal dialysis: Secondary | ICD-10-CM | POA: Diagnosis not present

## 2018-06-17 DIAGNOSIS — N186 End stage renal disease: Secondary | ICD-10-CM | POA: Diagnosis not present

## 2018-06-19 DIAGNOSIS — N186 End stage renal disease: Secondary | ICD-10-CM | POA: Diagnosis not present

## 2018-06-19 DIAGNOSIS — N2581 Secondary hyperparathyroidism of renal origin: Secondary | ICD-10-CM | POA: Diagnosis not present

## 2018-06-19 DIAGNOSIS — E1121 Type 2 diabetes mellitus with diabetic nephropathy: Secondary | ICD-10-CM | POA: Diagnosis not present

## 2018-06-22 DIAGNOSIS — N186 End stage renal disease: Secondary | ICD-10-CM | POA: Diagnosis not present

## 2018-06-22 DIAGNOSIS — E1121 Type 2 diabetes mellitus with diabetic nephropathy: Secondary | ICD-10-CM | POA: Diagnosis not present

## 2018-06-22 DIAGNOSIS — N2581 Secondary hyperparathyroidism of renal origin: Secondary | ICD-10-CM | POA: Diagnosis not present

## 2018-06-23 DIAGNOSIS — Z452 Encounter for adjustment and management of vascular access device: Secondary | ICD-10-CM | POA: Diagnosis not present

## 2018-06-24 DIAGNOSIS — N2581 Secondary hyperparathyroidism of renal origin: Secondary | ICD-10-CM | POA: Diagnosis not present

## 2018-06-24 DIAGNOSIS — E1121 Type 2 diabetes mellitus with diabetic nephropathy: Secondary | ICD-10-CM | POA: Diagnosis not present

## 2018-06-24 DIAGNOSIS — N186 End stage renal disease: Secondary | ICD-10-CM | POA: Diagnosis not present

## 2018-06-26 DIAGNOSIS — N186 End stage renal disease: Secondary | ICD-10-CM | POA: Diagnosis not present

## 2018-06-26 DIAGNOSIS — N2581 Secondary hyperparathyroidism of renal origin: Secondary | ICD-10-CM | POA: Diagnosis not present

## 2018-06-26 DIAGNOSIS — E1121 Type 2 diabetes mellitus with diabetic nephropathy: Secondary | ICD-10-CM | POA: Diagnosis not present

## 2018-06-29 DIAGNOSIS — E1121 Type 2 diabetes mellitus with diabetic nephropathy: Secondary | ICD-10-CM | POA: Diagnosis not present

## 2018-06-29 DIAGNOSIS — N186 End stage renal disease: Secondary | ICD-10-CM | POA: Diagnosis not present

## 2018-06-29 DIAGNOSIS — N2581 Secondary hyperparathyroidism of renal origin: Secondary | ICD-10-CM | POA: Diagnosis not present

## 2018-06-30 DIAGNOSIS — N186 End stage renal disease: Secondary | ICD-10-CM | POA: Diagnosis not present

## 2018-06-30 DIAGNOSIS — Z992 Dependence on renal dialysis: Secondary | ICD-10-CM | POA: Diagnosis not present

## 2018-06-30 DIAGNOSIS — T82858A Stenosis of vascular prosthetic devices, implants and grafts, initial encounter: Secondary | ICD-10-CM | POA: Diagnosis not present

## 2018-06-30 DIAGNOSIS — I871 Compression of vein: Secondary | ICD-10-CM | POA: Diagnosis not present

## 2018-07-01 DIAGNOSIS — N186 End stage renal disease: Secondary | ICD-10-CM | POA: Diagnosis not present

## 2018-07-01 DIAGNOSIS — N2581 Secondary hyperparathyroidism of renal origin: Secondary | ICD-10-CM | POA: Diagnosis not present

## 2018-07-01 DIAGNOSIS — E1121 Type 2 diabetes mellitus with diabetic nephropathy: Secondary | ICD-10-CM | POA: Diagnosis not present

## 2018-07-03 DIAGNOSIS — N186 End stage renal disease: Secondary | ICD-10-CM | POA: Diagnosis not present

## 2018-07-03 DIAGNOSIS — E1121 Type 2 diabetes mellitus with diabetic nephropathy: Secondary | ICD-10-CM | POA: Diagnosis not present

## 2018-07-03 DIAGNOSIS — N2581 Secondary hyperparathyroidism of renal origin: Secondary | ICD-10-CM | POA: Diagnosis not present

## 2018-07-06 DIAGNOSIS — N186 End stage renal disease: Secondary | ICD-10-CM | POA: Diagnosis not present

## 2018-07-06 DIAGNOSIS — E1121 Type 2 diabetes mellitus with diabetic nephropathy: Secondary | ICD-10-CM | POA: Diagnosis not present

## 2018-07-06 DIAGNOSIS — N2581 Secondary hyperparathyroidism of renal origin: Secondary | ICD-10-CM | POA: Diagnosis not present

## 2018-07-08 DIAGNOSIS — N2581 Secondary hyperparathyroidism of renal origin: Secondary | ICD-10-CM | POA: Diagnosis not present

## 2018-07-08 DIAGNOSIS — N186 End stage renal disease: Secondary | ICD-10-CM | POA: Diagnosis not present

## 2018-07-08 DIAGNOSIS — E1121 Type 2 diabetes mellitus with diabetic nephropathy: Secondary | ICD-10-CM | POA: Diagnosis not present

## 2018-07-10 DIAGNOSIS — N2581 Secondary hyperparathyroidism of renal origin: Secondary | ICD-10-CM | POA: Diagnosis not present

## 2018-07-10 DIAGNOSIS — E1121 Type 2 diabetes mellitus with diabetic nephropathy: Secondary | ICD-10-CM | POA: Diagnosis not present

## 2018-07-10 DIAGNOSIS — N186 End stage renal disease: Secondary | ICD-10-CM | POA: Diagnosis not present

## 2018-07-13 DIAGNOSIS — N2581 Secondary hyperparathyroidism of renal origin: Secondary | ICD-10-CM | POA: Diagnosis not present

## 2018-07-13 DIAGNOSIS — E1121 Type 2 diabetes mellitus with diabetic nephropathy: Secondary | ICD-10-CM | POA: Diagnosis not present

## 2018-07-13 DIAGNOSIS — N186 End stage renal disease: Secondary | ICD-10-CM | POA: Diagnosis not present

## 2018-07-14 DIAGNOSIS — Z08 Encounter for follow-up examination after completed treatment for malignant neoplasm: Secondary | ICD-10-CM | POA: Diagnosis not present

## 2018-07-14 DIAGNOSIS — Z85528 Personal history of other malignant neoplasm of kidney: Secondary | ICD-10-CM | POA: Diagnosis not present

## 2018-07-15 DIAGNOSIS — E1121 Type 2 diabetes mellitus with diabetic nephropathy: Secondary | ICD-10-CM | POA: Diagnosis not present

## 2018-07-15 DIAGNOSIS — N186 End stage renal disease: Secondary | ICD-10-CM | POA: Diagnosis not present

## 2018-07-15 DIAGNOSIS — N2581 Secondary hyperparathyroidism of renal origin: Secondary | ICD-10-CM | POA: Diagnosis not present

## 2018-07-16 DIAGNOSIS — Z08 Encounter for follow-up examination after completed treatment for malignant neoplasm: Secondary | ICD-10-CM | POA: Diagnosis not present

## 2018-07-16 DIAGNOSIS — Z85528 Personal history of other malignant neoplasm of kidney: Secondary | ICD-10-CM | POA: Diagnosis not present

## 2018-07-17 DIAGNOSIS — N2581 Secondary hyperparathyroidism of renal origin: Secondary | ICD-10-CM | POA: Diagnosis not present

## 2018-07-17 DIAGNOSIS — E1121 Type 2 diabetes mellitus with diabetic nephropathy: Secondary | ICD-10-CM | POA: Diagnosis not present

## 2018-07-17 DIAGNOSIS — N186 End stage renal disease: Secondary | ICD-10-CM | POA: Diagnosis not present

## 2018-07-18 DIAGNOSIS — Z992 Dependence on renal dialysis: Secondary | ICD-10-CM | POA: Diagnosis not present

## 2018-07-18 DIAGNOSIS — N186 End stage renal disease: Secondary | ICD-10-CM | POA: Diagnosis not present

## 2018-07-18 DIAGNOSIS — E1122 Type 2 diabetes mellitus with diabetic chronic kidney disease: Secondary | ICD-10-CM | POA: Diagnosis not present

## 2018-07-20 DIAGNOSIS — E1121 Type 2 diabetes mellitus with diabetic nephropathy: Secondary | ICD-10-CM | POA: Diagnosis not present

## 2018-07-20 DIAGNOSIS — D631 Anemia in chronic kidney disease: Secondary | ICD-10-CM | POA: Diagnosis not present

## 2018-07-20 DIAGNOSIS — N2581 Secondary hyperparathyroidism of renal origin: Secondary | ICD-10-CM | POA: Diagnosis not present

## 2018-07-20 DIAGNOSIS — N186 End stage renal disease: Secondary | ICD-10-CM | POA: Diagnosis not present

## 2018-07-22 DIAGNOSIS — D631 Anemia in chronic kidney disease: Secondary | ICD-10-CM | POA: Diagnosis not present

## 2018-07-22 DIAGNOSIS — N186 End stage renal disease: Secondary | ICD-10-CM | POA: Diagnosis not present

## 2018-07-22 DIAGNOSIS — N2581 Secondary hyperparathyroidism of renal origin: Secondary | ICD-10-CM | POA: Diagnosis not present

## 2018-07-22 DIAGNOSIS — E1121 Type 2 diabetes mellitus with diabetic nephropathy: Secondary | ICD-10-CM | POA: Diagnosis not present

## 2018-07-24 DIAGNOSIS — N2581 Secondary hyperparathyroidism of renal origin: Secondary | ICD-10-CM | POA: Diagnosis not present

## 2018-07-24 DIAGNOSIS — E1121 Type 2 diabetes mellitus with diabetic nephropathy: Secondary | ICD-10-CM | POA: Diagnosis not present

## 2018-07-24 DIAGNOSIS — N186 End stage renal disease: Secondary | ICD-10-CM | POA: Diagnosis not present

## 2018-07-24 DIAGNOSIS — D631 Anemia in chronic kidney disease: Secondary | ICD-10-CM | POA: Diagnosis not present

## 2018-07-27 DIAGNOSIS — D631 Anemia in chronic kidney disease: Secondary | ICD-10-CM | POA: Diagnosis not present

## 2018-07-27 DIAGNOSIS — N186 End stage renal disease: Secondary | ICD-10-CM | POA: Diagnosis not present

## 2018-07-27 DIAGNOSIS — N2581 Secondary hyperparathyroidism of renal origin: Secondary | ICD-10-CM | POA: Diagnosis not present

## 2018-07-27 DIAGNOSIS — E1121 Type 2 diabetes mellitus with diabetic nephropathy: Secondary | ICD-10-CM | POA: Diagnosis not present

## 2018-07-29 DIAGNOSIS — E1121 Type 2 diabetes mellitus with diabetic nephropathy: Secondary | ICD-10-CM | POA: Diagnosis not present

## 2018-07-29 DIAGNOSIS — N186 End stage renal disease: Secondary | ICD-10-CM | POA: Diagnosis not present

## 2018-07-29 DIAGNOSIS — D631 Anemia in chronic kidney disease: Secondary | ICD-10-CM | POA: Diagnosis not present

## 2018-07-29 DIAGNOSIS — N2581 Secondary hyperparathyroidism of renal origin: Secondary | ICD-10-CM | POA: Diagnosis not present

## 2018-07-31 DIAGNOSIS — D631 Anemia in chronic kidney disease: Secondary | ICD-10-CM | POA: Diagnosis not present

## 2018-07-31 DIAGNOSIS — N2581 Secondary hyperparathyroidism of renal origin: Secondary | ICD-10-CM | POA: Diagnosis not present

## 2018-07-31 DIAGNOSIS — E1121 Type 2 diabetes mellitus with diabetic nephropathy: Secondary | ICD-10-CM | POA: Diagnosis not present

## 2018-07-31 DIAGNOSIS — N186 End stage renal disease: Secondary | ICD-10-CM | POA: Diagnosis not present

## 2018-08-03 DIAGNOSIS — D631 Anemia in chronic kidney disease: Secondary | ICD-10-CM | POA: Diagnosis not present

## 2018-08-03 DIAGNOSIS — N186 End stage renal disease: Secondary | ICD-10-CM | POA: Diagnosis not present

## 2018-08-03 DIAGNOSIS — N2581 Secondary hyperparathyroidism of renal origin: Secondary | ICD-10-CM | POA: Diagnosis not present

## 2018-08-03 DIAGNOSIS — E1121 Type 2 diabetes mellitus with diabetic nephropathy: Secondary | ICD-10-CM | POA: Diagnosis not present

## 2018-08-05 DIAGNOSIS — D631 Anemia in chronic kidney disease: Secondary | ICD-10-CM | POA: Diagnosis not present

## 2018-08-05 DIAGNOSIS — N186 End stage renal disease: Secondary | ICD-10-CM | POA: Diagnosis not present

## 2018-08-05 DIAGNOSIS — E1121 Type 2 diabetes mellitus with diabetic nephropathy: Secondary | ICD-10-CM | POA: Diagnosis not present

## 2018-08-05 DIAGNOSIS — N2581 Secondary hyperparathyroidism of renal origin: Secondary | ICD-10-CM | POA: Diagnosis not present

## 2018-08-07 DIAGNOSIS — N2581 Secondary hyperparathyroidism of renal origin: Secondary | ICD-10-CM | POA: Diagnosis not present

## 2018-08-07 DIAGNOSIS — D631 Anemia in chronic kidney disease: Secondary | ICD-10-CM | POA: Diagnosis not present

## 2018-08-07 DIAGNOSIS — N186 End stage renal disease: Secondary | ICD-10-CM | POA: Diagnosis not present

## 2018-08-07 DIAGNOSIS — E1121 Type 2 diabetes mellitus with diabetic nephropathy: Secondary | ICD-10-CM | POA: Diagnosis not present

## 2018-08-10 DIAGNOSIS — D631 Anemia in chronic kidney disease: Secondary | ICD-10-CM | POA: Diagnosis not present

## 2018-08-10 DIAGNOSIS — N2581 Secondary hyperparathyroidism of renal origin: Secondary | ICD-10-CM | POA: Diagnosis not present

## 2018-08-10 DIAGNOSIS — E1121 Type 2 diabetes mellitus with diabetic nephropathy: Secondary | ICD-10-CM | POA: Diagnosis not present

## 2018-08-10 DIAGNOSIS — N186 End stage renal disease: Secondary | ICD-10-CM | POA: Diagnosis not present

## 2018-08-12 DIAGNOSIS — D631 Anemia in chronic kidney disease: Secondary | ICD-10-CM | POA: Diagnosis not present

## 2018-08-12 DIAGNOSIS — E1121 Type 2 diabetes mellitus with diabetic nephropathy: Secondary | ICD-10-CM | POA: Diagnosis not present

## 2018-08-12 DIAGNOSIS — N186 End stage renal disease: Secondary | ICD-10-CM | POA: Diagnosis not present

## 2018-08-12 DIAGNOSIS — N2581 Secondary hyperparathyroidism of renal origin: Secondary | ICD-10-CM | POA: Diagnosis not present

## 2018-08-14 DIAGNOSIS — E1121 Type 2 diabetes mellitus with diabetic nephropathy: Secondary | ICD-10-CM | POA: Diagnosis not present

## 2018-08-14 DIAGNOSIS — D631 Anemia in chronic kidney disease: Secondary | ICD-10-CM | POA: Diagnosis not present

## 2018-08-14 DIAGNOSIS — N186 End stage renal disease: Secondary | ICD-10-CM | POA: Diagnosis not present

## 2018-08-14 DIAGNOSIS — N2581 Secondary hyperparathyroidism of renal origin: Secondary | ICD-10-CM | POA: Diagnosis not present

## 2018-08-16 DIAGNOSIS — N186 End stage renal disease: Secondary | ICD-10-CM | POA: Diagnosis not present

## 2018-08-16 DIAGNOSIS — E1122 Type 2 diabetes mellitus with diabetic chronic kidney disease: Secondary | ICD-10-CM | POA: Diagnosis not present

## 2018-08-16 DIAGNOSIS — Z992 Dependence on renal dialysis: Secondary | ICD-10-CM | POA: Diagnosis not present

## 2018-08-17 DIAGNOSIS — D631 Anemia in chronic kidney disease: Secondary | ICD-10-CM | POA: Diagnosis not present

## 2018-08-17 DIAGNOSIS — N186 End stage renal disease: Secondary | ICD-10-CM | POA: Diagnosis not present

## 2018-08-17 DIAGNOSIS — E1121 Type 2 diabetes mellitus with diabetic nephropathy: Secondary | ICD-10-CM | POA: Diagnosis not present

## 2018-08-17 DIAGNOSIS — N2581 Secondary hyperparathyroidism of renal origin: Secondary | ICD-10-CM | POA: Diagnosis not present

## 2018-08-19 DIAGNOSIS — E1121 Type 2 diabetes mellitus with diabetic nephropathy: Secondary | ICD-10-CM | POA: Diagnosis not present

## 2018-08-19 DIAGNOSIS — N186 End stage renal disease: Secondary | ICD-10-CM | POA: Diagnosis not present

## 2018-08-19 DIAGNOSIS — N2581 Secondary hyperparathyroidism of renal origin: Secondary | ICD-10-CM | POA: Diagnosis not present

## 2018-08-19 DIAGNOSIS — D631 Anemia in chronic kidney disease: Secondary | ICD-10-CM | POA: Diagnosis not present

## 2018-08-21 DIAGNOSIS — N186 End stage renal disease: Secondary | ICD-10-CM | POA: Diagnosis not present

## 2018-08-21 DIAGNOSIS — D631 Anemia in chronic kidney disease: Secondary | ICD-10-CM | POA: Diagnosis not present

## 2018-08-21 DIAGNOSIS — N2581 Secondary hyperparathyroidism of renal origin: Secondary | ICD-10-CM | POA: Diagnosis not present

## 2018-08-21 DIAGNOSIS — E1121 Type 2 diabetes mellitus with diabetic nephropathy: Secondary | ICD-10-CM | POA: Diagnosis not present

## 2018-08-24 DIAGNOSIS — E1121 Type 2 diabetes mellitus with diabetic nephropathy: Secondary | ICD-10-CM | POA: Diagnosis not present

## 2018-08-24 DIAGNOSIS — N186 End stage renal disease: Secondary | ICD-10-CM | POA: Diagnosis not present

## 2018-08-24 DIAGNOSIS — D631 Anemia in chronic kidney disease: Secondary | ICD-10-CM | POA: Diagnosis not present

## 2018-08-24 DIAGNOSIS — N2581 Secondary hyperparathyroidism of renal origin: Secondary | ICD-10-CM | POA: Diagnosis not present

## 2018-08-26 DIAGNOSIS — D631 Anemia in chronic kidney disease: Secondary | ICD-10-CM | POA: Diagnosis not present

## 2018-08-26 DIAGNOSIS — N186 End stage renal disease: Secondary | ICD-10-CM | POA: Diagnosis not present

## 2018-08-26 DIAGNOSIS — E1121 Type 2 diabetes mellitus with diabetic nephropathy: Secondary | ICD-10-CM | POA: Diagnosis not present

## 2018-08-26 DIAGNOSIS — N2581 Secondary hyperparathyroidism of renal origin: Secondary | ICD-10-CM | POA: Diagnosis not present

## 2018-08-28 DIAGNOSIS — E1121 Type 2 diabetes mellitus with diabetic nephropathy: Secondary | ICD-10-CM | POA: Diagnosis not present

## 2018-08-28 DIAGNOSIS — N186 End stage renal disease: Secondary | ICD-10-CM | POA: Diagnosis not present

## 2018-08-28 DIAGNOSIS — D631 Anemia in chronic kidney disease: Secondary | ICD-10-CM | POA: Diagnosis not present

## 2018-08-28 DIAGNOSIS — N2581 Secondary hyperparathyroidism of renal origin: Secondary | ICD-10-CM | POA: Diagnosis not present

## 2018-08-31 DIAGNOSIS — N186 End stage renal disease: Secondary | ICD-10-CM | POA: Diagnosis not present

## 2018-08-31 DIAGNOSIS — D631 Anemia in chronic kidney disease: Secondary | ICD-10-CM | POA: Diagnosis not present

## 2018-08-31 DIAGNOSIS — E1121 Type 2 diabetes mellitus with diabetic nephropathy: Secondary | ICD-10-CM | POA: Diagnosis not present

## 2018-08-31 DIAGNOSIS — N2581 Secondary hyperparathyroidism of renal origin: Secondary | ICD-10-CM | POA: Diagnosis not present

## 2018-09-02 DIAGNOSIS — D631 Anemia in chronic kidney disease: Secondary | ICD-10-CM | POA: Diagnosis not present

## 2018-09-02 DIAGNOSIS — N186 End stage renal disease: Secondary | ICD-10-CM | POA: Diagnosis not present

## 2018-09-02 DIAGNOSIS — N2581 Secondary hyperparathyroidism of renal origin: Secondary | ICD-10-CM | POA: Diagnosis not present

## 2018-09-02 DIAGNOSIS — E1121 Type 2 diabetes mellitus with diabetic nephropathy: Secondary | ICD-10-CM | POA: Diagnosis not present

## 2018-09-04 DIAGNOSIS — E1121 Type 2 diabetes mellitus with diabetic nephropathy: Secondary | ICD-10-CM | POA: Diagnosis not present

## 2018-09-04 DIAGNOSIS — N2581 Secondary hyperparathyroidism of renal origin: Secondary | ICD-10-CM | POA: Diagnosis not present

## 2018-09-04 DIAGNOSIS — D631 Anemia in chronic kidney disease: Secondary | ICD-10-CM | POA: Diagnosis not present

## 2018-09-04 DIAGNOSIS — N186 End stage renal disease: Secondary | ICD-10-CM | POA: Diagnosis not present

## 2018-09-07 DIAGNOSIS — N186 End stage renal disease: Secondary | ICD-10-CM | POA: Diagnosis not present

## 2018-09-07 DIAGNOSIS — D631 Anemia in chronic kidney disease: Secondary | ICD-10-CM | POA: Diagnosis not present

## 2018-09-07 DIAGNOSIS — N2581 Secondary hyperparathyroidism of renal origin: Secondary | ICD-10-CM | POA: Diagnosis not present

## 2018-09-07 DIAGNOSIS — E1121 Type 2 diabetes mellitus with diabetic nephropathy: Secondary | ICD-10-CM | POA: Diagnosis not present

## 2018-09-09 DIAGNOSIS — E1121 Type 2 diabetes mellitus with diabetic nephropathy: Secondary | ICD-10-CM | POA: Diagnosis not present

## 2018-09-09 DIAGNOSIS — D631 Anemia in chronic kidney disease: Secondary | ICD-10-CM | POA: Diagnosis not present

## 2018-09-09 DIAGNOSIS — N186 End stage renal disease: Secondary | ICD-10-CM | POA: Diagnosis not present

## 2018-09-09 DIAGNOSIS — N2581 Secondary hyperparathyroidism of renal origin: Secondary | ICD-10-CM | POA: Diagnosis not present

## 2018-09-11 DIAGNOSIS — N186 End stage renal disease: Secondary | ICD-10-CM | POA: Diagnosis not present

## 2018-09-11 DIAGNOSIS — E1121 Type 2 diabetes mellitus with diabetic nephropathy: Secondary | ICD-10-CM | POA: Diagnosis not present

## 2018-09-11 DIAGNOSIS — D631 Anemia in chronic kidney disease: Secondary | ICD-10-CM | POA: Diagnosis not present

## 2018-09-11 DIAGNOSIS — N2581 Secondary hyperparathyroidism of renal origin: Secondary | ICD-10-CM | POA: Diagnosis not present

## 2018-09-14 DIAGNOSIS — N186 End stage renal disease: Secondary | ICD-10-CM | POA: Diagnosis not present

## 2018-09-14 DIAGNOSIS — N2581 Secondary hyperparathyroidism of renal origin: Secondary | ICD-10-CM | POA: Diagnosis not present

## 2018-09-14 DIAGNOSIS — D631 Anemia in chronic kidney disease: Secondary | ICD-10-CM | POA: Diagnosis not present

## 2018-09-14 DIAGNOSIS — E1121 Type 2 diabetes mellitus with diabetic nephropathy: Secondary | ICD-10-CM | POA: Diagnosis not present

## 2018-09-16 DIAGNOSIS — E1121 Type 2 diabetes mellitus with diabetic nephropathy: Secondary | ICD-10-CM | POA: Diagnosis not present

## 2018-09-16 DIAGNOSIS — E1122 Type 2 diabetes mellitus with diabetic chronic kidney disease: Secondary | ICD-10-CM | POA: Diagnosis not present

## 2018-09-16 DIAGNOSIS — Z992 Dependence on renal dialysis: Secondary | ICD-10-CM | POA: Diagnosis not present

## 2018-09-16 DIAGNOSIS — D631 Anemia in chronic kidney disease: Secondary | ICD-10-CM | POA: Diagnosis not present

## 2018-09-16 DIAGNOSIS — N2581 Secondary hyperparathyroidism of renal origin: Secondary | ICD-10-CM | POA: Diagnosis not present

## 2018-09-16 DIAGNOSIS — N186 End stage renal disease: Secondary | ICD-10-CM | POA: Diagnosis not present

## 2018-09-18 DIAGNOSIS — N2581 Secondary hyperparathyroidism of renal origin: Secondary | ICD-10-CM | POA: Diagnosis not present

## 2018-09-18 DIAGNOSIS — E1121 Type 2 diabetes mellitus with diabetic nephropathy: Secondary | ICD-10-CM | POA: Diagnosis not present

## 2018-09-18 DIAGNOSIS — D631 Anemia in chronic kidney disease: Secondary | ICD-10-CM | POA: Diagnosis not present

## 2018-09-18 DIAGNOSIS — N186 End stage renal disease: Secondary | ICD-10-CM | POA: Diagnosis not present

## 2018-09-21 DIAGNOSIS — N2581 Secondary hyperparathyroidism of renal origin: Secondary | ICD-10-CM | POA: Diagnosis not present

## 2018-09-21 DIAGNOSIS — D631 Anemia in chronic kidney disease: Secondary | ICD-10-CM | POA: Diagnosis not present

## 2018-09-21 DIAGNOSIS — N186 End stage renal disease: Secondary | ICD-10-CM | POA: Diagnosis not present

## 2018-09-21 DIAGNOSIS — E1121 Type 2 diabetes mellitus with diabetic nephropathy: Secondary | ICD-10-CM | POA: Diagnosis not present

## 2018-09-23 DIAGNOSIS — E1121 Type 2 diabetes mellitus with diabetic nephropathy: Secondary | ICD-10-CM | POA: Diagnosis not present

## 2018-09-23 DIAGNOSIS — N2581 Secondary hyperparathyroidism of renal origin: Secondary | ICD-10-CM | POA: Diagnosis not present

## 2018-09-23 DIAGNOSIS — D631 Anemia in chronic kidney disease: Secondary | ICD-10-CM | POA: Diagnosis not present

## 2018-09-23 DIAGNOSIS — N186 End stage renal disease: Secondary | ICD-10-CM | POA: Diagnosis not present

## 2018-09-25 DIAGNOSIS — E1121 Type 2 diabetes mellitus with diabetic nephropathy: Secondary | ICD-10-CM | POA: Diagnosis not present

## 2018-09-25 DIAGNOSIS — N186 End stage renal disease: Secondary | ICD-10-CM | POA: Diagnosis not present

## 2018-09-25 DIAGNOSIS — N2581 Secondary hyperparathyroidism of renal origin: Secondary | ICD-10-CM | POA: Diagnosis not present

## 2018-09-25 DIAGNOSIS — D631 Anemia in chronic kidney disease: Secondary | ICD-10-CM | POA: Diagnosis not present

## 2018-09-28 DIAGNOSIS — N186 End stage renal disease: Secondary | ICD-10-CM | POA: Diagnosis not present

## 2018-09-28 DIAGNOSIS — D631 Anemia in chronic kidney disease: Secondary | ICD-10-CM | POA: Diagnosis not present

## 2018-09-28 DIAGNOSIS — E1121 Type 2 diabetes mellitus with diabetic nephropathy: Secondary | ICD-10-CM | POA: Diagnosis not present

## 2018-09-28 DIAGNOSIS — N2581 Secondary hyperparathyroidism of renal origin: Secondary | ICD-10-CM | POA: Diagnosis not present

## 2018-09-30 DIAGNOSIS — N2581 Secondary hyperparathyroidism of renal origin: Secondary | ICD-10-CM | POA: Diagnosis not present

## 2018-09-30 DIAGNOSIS — E1121 Type 2 diabetes mellitus with diabetic nephropathy: Secondary | ICD-10-CM | POA: Diagnosis not present

## 2018-09-30 DIAGNOSIS — D631 Anemia in chronic kidney disease: Secondary | ICD-10-CM | POA: Diagnosis not present

## 2018-09-30 DIAGNOSIS — N186 End stage renal disease: Secondary | ICD-10-CM | POA: Diagnosis not present

## 2018-10-02 DIAGNOSIS — E1121 Type 2 diabetes mellitus with diabetic nephropathy: Secondary | ICD-10-CM | POA: Diagnosis not present

## 2018-10-02 DIAGNOSIS — N2581 Secondary hyperparathyroidism of renal origin: Secondary | ICD-10-CM | POA: Diagnosis not present

## 2018-10-02 DIAGNOSIS — D631 Anemia in chronic kidney disease: Secondary | ICD-10-CM | POA: Diagnosis not present

## 2018-10-02 DIAGNOSIS — N186 End stage renal disease: Secondary | ICD-10-CM | POA: Diagnosis not present

## 2018-10-05 DIAGNOSIS — D631 Anemia in chronic kidney disease: Secondary | ICD-10-CM | POA: Diagnosis not present

## 2018-10-05 DIAGNOSIS — N2581 Secondary hyperparathyroidism of renal origin: Secondary | ICD-10-CM | POA: Diagnosis not present

## 2018-10-05 DIAGNOSIS — N186 End stage renal disease: Secondary | ICD-10-CM | POA: Diagnosis not present

## 2018-10-05 DIAGNOSIS — E1121 Type 2 diabetes mellitus with diabetic nephropathy: Secondary | ICD-10-CM | POA: Diagnosis not present

## 2018-10-07 DIAGNOSIS — D631 Anemia in chronic kidney disease: Secondary | ICD-10-CM | POA: Diagnosis not present

## 2018-10-07 DIAGNOSIS — N2581 Secondary hyperparathyroidism of renal origin: Secondary | ICD-10-CM | POA: Diagnosis not present

## 2018-10-07 DIAGNOSIS — E1121 Type 2 diabetes mellitus with diabetic nephropathy: Secondary | ICD-10-CM | POA: Diagnosis not present

## 2018-10-07 DIAGNOSIS — N186 End stage renal disease: Secondary | ICD-10-CM | POA: Diagnosis not present

## 2018-10-09 DIAGNOSIS — N186 End stage renal disease: Secondary | ICD-10-CM | POA: Diagnosis not present

## 2018-10-09 DIAGNOSIS — D631 Anemia in chronic kidney disease: Secondary | ICD-10-CM | POA: Diagnosis not present

## 2018-10-09 DIAGNOSIS — E1121 Type 2 diabetes mellitus with diabetic nephropathy: Secondary | ICD-10-CM | POA: Diagnosis not present

## 2018-10-09 DIAGNOSIS — N2581 Secondary hyperparathyroidism of renal origin: Secondary | ICD-10-CM | POA: Diagnosis not present

## 2018-10-12 DIAGNOSIS — E1121 Type 2 diabetes mellitus with diabetic nephropathy: Secondary | ICD-10-CM | POA: Diagnosis not present

## 2018-10-12 DIAGNOSIS — N186 End stage renal disease: Secondary | ICD-10-CM | POA: Diagnosis not present

## 2018-10-12 DIAGNOSIS — D631 Anemia in chronic kidney disease: Secondary | ICD-10-CM | POA: Diagnosis not present

## 2018-10-12 DIAGNOSIS — N2581 Secondary hyperparathyroidism of renal origin: Secondary | ICD-10-CM | POA: Diagnosis not present

## 2018-10-14 DIAGNOSIS — D631 Anemia in chronic kidney disease: Secondary | ICD-10-CM | POA: Diagnosis not present

## 2018-10-14 DIAGNOSIS — N186 End stage renal disease: Secondary | ICD-10-CM | POA: Diagnosis not present

## 2018-10-14 DIAGNOSIS — E1121 Type 2 diabetes mellitus with diabetic nephropathy: Secondary | ICD-10-CM | POA: Diagnosis not present

## 2018-10-14 DIAGNOSIS — N2581 Secondary hyperparathyroidism of renal origin: Secondary | ICD-10-CM | POA: Diagnosis not present

## 2018-10-16 DIAGNOSIS — D631 Anemia in chronic kidney disease: Secondary | ICD-10-CM | POA: Diagnosis not present

## 2018-10-16 DIAGNOSIS — E1122 Type 2 diabetes mellitus with diabetic chronic kidney disease: Secondary | ICD-10-CM | POA: Diagnosis not present

## 2018-10-16 DIAGNOSIS — Z992 Dependence on renal dialysis: Secondary | ICD-10-CM | POA: Diagnosis not present

## 2018-10-16 DIAGNOSIS — N2581 Secondary hyperparathyroidism of renal origin: Secondary | ICD-10-CM | POA: Diagnosis not present

## 2018-10-16 DIAGNOSIS — E1121 Type 2 diabetes mellitus with diabetic nephropathy: Secondary | ICD-10-CM | POA: Diagnosis not present

## 2018-10-16 DIAGNOSIS — N186 End stage renal disease: Secondary | ICD-10-CM | POA: Diagnosis not present

## 2018-10-19 DIAGNOSIS — N2581 Secondary hyperparathyroidism of renal origin: Secondary | ICD-10-CM | POA: Diagnosis not present

## 2018-10-19 DIAGNOSIS — D631 Anemia in chronic kidney disease: Secondary | ICD-10-CM | POA: Diagnosis not present

## 2018-10-19 DIAGNOSIS — E1121 Type 2 diabetes mellitus with diabetic nephropathy: Secondary | ICD-10-CM | POA: Diagnosis not present

## 2018-10-19 DIAGNOSIS — N186 End stage renal disease: Secondary | ICD-10-CM | POA: Diagnosis not present

## 2018-10-21 ENCOUNTER — Ambulatory Visit (INDEPENDENT_AMBULATORY_CARE_PROVIDER_SITE_OTHER): Payer: Medicare Other | Admitting: Family Medicine

## 2018-10-21 ENCOUNTER — Other Ambulatory Visit: Payer: Self-pay

## 2018-10-21 ENCOUNTER — Telehealth: Payer: Self-pay | Admitting: Family Medicine

## 2018-10-21 ENCOUNTER — Encounter: Payer: Self-pay | Admitting: Family Medicine

## 2018-10-21 VITALS — BP 150/58 | HR 79 | Ht 71.0 in | Wt 277.5 lb

## 2018-10-21 DIAGNOSIS — E1349 Other specified diabetes mellitus with other diabetic neurological complication: Secondary | ICD-10-CM

## 2018-10-21 DIAGNOSIS — E119 Type 2 diabetes mellitus without complications: Secondary | ICD-10-CM | POA: Diagnosis not present

## 2018-10-21 DIAGNOSIS — E785 Hyperlipidemia, unspecified: Secondary | ICD-10-CM | POA: Diagnosis not present

## 2018-10-21 DIAGNOSIS — G4733 Obstructive sleep apnea (adult) (pediatric): Secondary | ICD-10-CM

## 2018-10-21 DIAGNOSIS — Z992 Dependence on renal dialysis: Secondary | ICD-10-CM | POA: Diagnosis not present

## 2018-10-21 DIAGNOSIS — I10 Essential (primary) hypertension: Secondary | ICD-10-CM

## 2018-10-21 DIAGNOSIS — M109 Gout, unspecified: Secondary | ICD-10-CM

## 2018-10-21 DIAGNOSIS — N186 End stage renal disease: Secondary | ICD-10-CM

## 2018-10-21 DIAGNOSIS — E1169 Type 2 diabetes mellitus with other specified complication: Secondary | ICD-10-CM

## 2018-10-21 DIAGNOSIS — I509 Heart failure, unspecified: Secondary | ICD-10-CM

## 2018-10-21 DIAGNOSIS — E1121 Type 2 diabetes mellitus with diabetic nephropathy: Secondary | ICD-10-CM | POA: Diagnosis not present

## 2018-10-21 DIAGNOSIS — D631 Anemia in chronic kidney disease: Secondary | ICD-10-CM | POA: Diagnosis not present

## 2018-10-21 DIAGNOSIS — N2581 Secondary hyperparathyroidism of renal origin: Secondary | ICD-10-CM | POA: Diagnosis not present

## 2018-10-21 HISTORY — DX: Gout, unspecified: M10.9

## 2018-10-21 LAB — POCT GLYCOSYLATED HEMOGLOBIN (HGB A1C): HbA1c, POC (controlled diabetic range): 5.7 % (ref 0.0–7.0)

## 2018-10-21 MED ORDER — CETIRIZINE HCL 5 MG PO TABS: 5.0000 mg | ORAL_TABLET | Freq: Every day | ORAL | 1 refills | Status: DC

## 2018-10-21 MED ORDER — OLOPATADINE HCL 0.2 % OP SOLN: OPHTHALMIC | 1 refills | Status: DC

## 2018-10-21 MED ORDER — TETANUS-DIPHTH-ACELL PERTUSSIS 5-2.5-18.5 LF-MCG/0.5 IM SUSP: 0.5000 mL | Freq: Once | INTRAMUSCULAR | 0 refills | Status: AC

## 2018-10-21 NOTE — Assessment & Plan Note (Signed)
FLP checked today. Continue current dose of Lipitor. Diet counseling and exercise discussed.

## 2018-10-21 NOTE — Patient Instructions (Signed)

## 2018-10-21 NOTE — Telephone Encounter (Signed)
HIPAA compliant callback message left. 

## 2018-10-21 NOTE — Assessment & Plan Note (Addendum)
A1C looks very good. He might benefit from reducing Glipizide to 2.5 mg qd. I contacted him to discuss result and plan. HIPAA compliant callback message left. I will f/u on this.

## 2018-10-21 NOTE — Assessment & Plan Note (Signed)
BP looks good off meds today. Home monitoring recommended.

## 2018-10-21 NOTE — Assessment & Plan Note (Signed)
Feet hygiene and daily check discussed. He is already doing this.

## 2018-10-21 NOTE — Progress Notes (Addendum)
Subjective:     Patient ID: Troy Wells, male   DOB: 21-Jul-1965, 53 y.o.   MRN: [id number]  HPI HTN/HLD: He is compliant with his meds. He is yet to take his antihypertensive agents today. Denies any concern. DM2:Compliant with Glipizide 5 mg qd. He has his glucometer at home, but does not check his CBG. His last eye exam was 1 year ago. Here for DM f/u. He has been unable to exercise as usual due to COVID-19 situation. ESRD:Receives dialysis M-W-F at [facility] RAX:ENMMHW SOB, no chest pain. Had heart attack many years ago. He is not seeing any cardiologist. He was on baby ASA which he d/c per nephrologist's recommendation. He was told that it makes his blood too thin and it affects his dialysis.  OSA: Wears his sleep machine regularly.  Current Outpatient Medications on File Prior to Visit  Medication Sig Dispense Refill  . allopurinol (ZYLOPRIM) 100 MG tablet Take 100 mg by mouth daily.    Marland Kitchen amLODipine (NORVASC) 10 MG tablet Take 10 mg by mouth daily.    Marland Kitchen acetaminophen (TYLENOL) 500 MG tablet Take 500 mg by mouth every 6 (six) hours as needed for mild pain or moderate pain.    Marland Kitchen aspirin 81 MG tablet Take 81 mg by mouth daily. Reported on 01/02/2016    . atorvastatin (LIPITOR) 40 MG tablet TAKE ONE TABLET BY MOUTH ONCE DAILY 90 tablet 1  . carvedilol (COREG) 25 MG tablet Take 1 tablet (25 mg total) by mouth 2 (two) times daily with a meal. 60 tablet 2  . cetirizine (ZYRTEC) 5 MG tablet Take 1 tablet (5 mg total) by mouth daily. 90 tablet 1  . colchicine 0.6 MG tablet Take 0.6 mg by mouth 2 (two) times daily.    . cyclobenzaprine (FLEXERIL) 10 MG tablet Take 1 tablet (10 mg total) by mouth 2 (two) times daily as needed for muscle spasms. 8 tablet 0  . FOSRENOL 1000 MG chewable tablet     . gabapentin (NEURONTIN) 300 MG capsule TAKE ONE CAPSULE BY MOUTH THREE TIMES DAILY 90 capsule 2  . glipiZIDE (GLUCOTROL) 5 MG tablet Take 5 mg by mouth daily.    . hydrocortisone cream 0.5 %  Apply 1 application topically 2 (two) times daily. 30 g 0  . minoxidil (LONITEN) 10 MG tablet Take 2 tablets (20 mg total) by mouth 2 (two) times daily. 360 tablet 3  . Olopatadine HCl (PATADAY) 0.2 % SOLN One drop to each eye once daily 2.5 mL 1  . oxyCODONE-acetaminophen (PERCOCET) 5-325 MG tablet Take 1 tablet by mouth every 6 (six) hours as needed for severe pain. (Patient not taking: Reported on 04/24/2018) 15 tablet 0  . SENSIPAR 30 MG tablet     . torsemide (DEMADEX) 100 MG tablet Take 100 mg by mouth daily. Take on Tuesday, Thursday, Saturday, and Sunday     No current facility-administered medications on file prior to visit.    Past Medical History:  Diagnosis Date  . Allergy   . Chronic kidney disease    Chronic Renal Insufficiency  . Coronary artery disease 08/15/2009   with AMI  . Diabetes mellitus without complication ([place])   . Dialysis patient [facility]) 09-27-2014   mon, wed, and fri  . Heart murmur   . Hyperlipidemia   . Hypertension   . Myocardial infarction ([place])    10 yrs ago per pt  . OSA treated with BiPAP 06/17/2010  . Sleep apnea    cpap  nightly  . Wears glasses   . Wears partial dentures      Review of Systems  Constitutional: Negative for fatigue.  HENT:       Eye irritation on and off due to allergy. Need med refill  Eyes: Negative for visual disturbance.       Eye irritation on and off due to allergy. Need med refill  Respiratory: Negative.   Cardiovascular: Negative.   Gastrointestinal: Negative.   Genitourinary:       Does not make urine  Neurological: Negative.   Psychiatric/Behavioral: Negative.   All other systems reviewed and are negative.      Objective:   Physical Exam Vitals signs and nursing note reviewed. Exam conducted with a chaperone present.  Constitutional:      Appearance: He is obese.  Eyes:     Extraocular Movements: Extraocular movements intact.     Pupils: Pupils are equal, round, and reactive to light.  Cardiovascular:      Rate and Rhythm: Normal rate and regular rhythm.     Heart sounds: Normal heart sounds. No murmur.  Pulmonary:     Effort: Pulmonary effort is normal. No respiratory distress.     Breath sounds: Normal breath sounds. No wheezing or rhonchi.  Abdominal:     General: Abdomen is flat. Bowel sounds are normal. There is no distension.     Comments: Obese  Musculoskeletal:     Right lower leg: Edema present.     Left lower leg: Edema present.     Comments: ++ B/L pedal swelling from few inches below his knees to his ankles.  Neurological:     General: No focal deficit present.     Mental Status: He is alert and oriented to person, place, and time.     Comments: Sensory exam of the his toes reduced, tested with the monofilament. Good pulses, no lesions or ulcers, good peripheral pulses.   Psychiatric:        Mood and Affect: Mood normal.        Behavior: Behavior normal.        Assessment:     HTN HLD DM2 ESRD on HD CHF OSA    Plan:     Check problem list.  More than 50% of this 45 min encounter was spent of chart review, evaluation and coordination of care.  Need refills of his allergy meds.

## 2018-10-21 NOTE — Assessment & Plan Note (Signed)
Contact nephology for dialysis today.

## 2018-10-21 NOTE — Assessment & Plan Note (Signed)
Stable

## 2018-10-22 ENCOUNTER — Telehealth: Payer: Self-pay | Admitting: Family Medicine

## 2018-10-22 DIAGNOSIS — N2581 Secondary hyperparathyroidism of renal origin: Secondary | ICD-10-CM | POA: Diagnosis not present

## 2018-10-22 DIAGNOSIS — D631 Anemia in chronic kidney disease: Secondary | ICD-10-CM | POA: Diagnosis not present

## 2018-10-22 DIAGNOSIS — E1121 Type 2 diabetes mellitus with diabetic nephropathy: Secondary | ICD-10-CM | POA: Diagnosis not present

## 2018-10-22 DIAGNOSIS — N186 End stage renal disease: Secondary | ICD-10-CM | POA: Diagnosis not present

## 2018-10-22 LAB — LIPID PANEL
Chol/HDL Ratio: 4.3 ratio (ref 0.0–5.0)
Cholesterol, Total: 147 mg/dL (ref 100–199)
HDL: 34 mg/dL — ABNORMAL LOW (ref >39)
LDL Calculated: 70 mg/dL (ref 0–99)
Triglycerides: 214 mg/dL — ABNORMAL HIGH (ref 0–149)
VLDL Cholesterol Cal: 43 mg/dL — ABNORMAL HIGH (ref 5–40)

## 2018-10-22 NOTE — Telephone Encounter (Signed)
HIPAA compliant callback message left.  Triglyceride elevated. Low HDL. He is already on high intensity statin. He will benefit from weight loss. May trial fish oil as well. Need recheck in 6 months. I will pass information to him when he calls.

## 2018-10-22 NOTE — Assessment & Plan Note (Signed)
No acute findings. ASA discussed given hx of cardiac arrest. He will discuss with his nephrologist.

## 2018-10-23 DIAGNOSIS — N2581 Secondary hyperparathyroidism of renal origin: Secondary | ICD-10-CM | POA: Diagnosis not present

## 2018-10-23 DIAGNOSIS — E1121 Type 2 diabetes mellitus with diabetic nephropathy: Secondary | ICD-10-CM | POA: Diagnosis not present

## 2018-10-23 DIAGNOSIS — D631 Anemia in chronic kidney disease: Secondary | ICD-10-CM | POA: Diagnosis not present

## 2018-10-23 DIAGNOSIS — N186 End stage renal disease: Secondary | ICD-10-CM | POA: Diagnosis not present

## 2018-10-26 DIAGNOSIS — N2581 Secondary hyperparathyroidism of renal origin: Secondary | ICD-10-CM | POA: Diagnosis not present

## 2018-10-26 DIAGNOSIS — N186 End stage renal disease: Secondary | ICD-10-CM | POA: Diagnosis not present

## 2018-10-26 DIAGNOSIS — D631 Anemia in chronic kidney disease: Secondary | ICD-10-CM | POA: Diagnosis not present

## 2018-10-26 DIAGNOSIS — E1121 Type 2 diabetes mellitus with diabetic nephropathy: Secondary | ICD-10-CM | POA: Diagnosis not present

## 2018-10-28 DIAGNOSIS — N2581 Secondary hyperparathyroidism of renal origin: Secondary | ICD-10-CM | POA: Diagnosis not present

## 2018-10-28 DIAGNOSIS — D631 Anemia in chronic kidney disease: Secondary | ICD-10-CM | POA: Diagnosis not present

## 2018-10-28 DIAGNOSIS — N186 End stage renal disease: Secondary | ICD-10-CM | POA: Diagnosis not present

## 2018-10-28 DIAGNOSIS — E1121 Type 2 diabetes mellitus with diabetic nephropathy: Secondary | ICD-10-CM | POA: Diagnosis not present

## 2018-10-30 DIAGNOSIS — E1121 Type 2 diabetes mellitus with diabetic nephropathy: Secondary | ICD-10-CM | POA: Diagnosis not present

## 2018-10-30 DIAGNOSIS — N2581 Secondary hyperparathyroidism of renal origin: Secondary | ICD-10-CM | POA: Diagnosis not present

## 2018-10-30 DIAGNOSIS — N186 End stage renal disease: Secondary | ICD-10-CM | POA: Diagnosis not present

## 2018-10-30 DIAGNOSIS — D631 Anemia in chronic kidney disease: Secondary | ICD-10-CM | POA: Diagnosis not present

## 2018-11-02 DIAGNOSIS — E1121 Type 2 diabetes mellitus with diabetic nephropathy: Secondary | ICD-10-CM | POA: Diagnosis not present

## 2018-11-02 DIAGNOSIS — N2581 Secondary hyperparathyroidism of renal origin: Secondary | ICD-10-CM | POA: Diagnosis not present

## 2018-11-02 DIAGNOSIS — D631 Anemia in chronic kidney disease: Secondary | ICD-10-CM | POA: Diagnosis not present

## 2018-11-02 DIAGNOSIS — N186 End stage renal disease: Secondary | ICD-10-CM | POA: Diagnosis not present

## 2018-11-04 DIAGNOSIS — N186 End stage renal disease: Secondary | ICD-10-CM | POA: Diagnosis not present

## 2018-11-04 DIAGNOSIS — N2581 Secondary hyperparathyroidism of renal origin: Secondary | ICD-10-CM | POA: Diagnosis not present

## 2018-11-04 DIAGNOSIS — E1121 Type 2 diabetes mellitus with diabetic nephropathy: Secondary | ICD-10-CM | POA: Diagnosis not present

## 2018-11-04 DIAGNOSIS — D631 Anemia in chronic kidney disease: Secondary | ICD-10-CM | POA: Diagnosis not present

## 2018-11-05 DIAGNOSIS — S838X2A Sprain of other specified parts of left knee, initial encounter: Secondary | ICD-10-CM | POA: Diagnosis not present

## 2018-11-05 DIAGNOSIS — S93402A Sprain of unspecified ligament of left ankle, initial encounter: Secondary | ICD-10-CM | POA: Diagnosis not present

## 2018-11-05 DIAGNOSIS — S161XXA Strain of muscle, fascia and tendon at neck level, initial encounter: Secondary | ICD-10-CM | POA: Diagnosis not present

## 2018-11-06 DIAGNOSIS — N2581 Secondary hyperparathyroidism of renal origin: Secondary | ICD-10-CM | POA: Diagnosis not present

## 2018-11-06 DIAGNOSIS — N186 End stage renal disease: Secondary | ICD-10-CM | POA: Diagnosis not present

## 2018-11-06 DIAGNOSIS — E1121 Type 2 diabetes mellitus with diabetic nephropathy: Secondary | ICD-10-CM | POA: Diagnosis not present

## 2018-11-06 DIAGNOSIS — D631 Anemia in chronic kidney disease: Secondary | ICD-10-CM | POA: Diagnosis not present

## 2018-11-09 DIAGNOSIS — N186 End stage renal disease: Secondary | ICD-10-CM | POA: Diagnosis not present

## 2018-11-09 DIAGNOSIS — N2581 Secondary hyperparathyroidism of renal origin: Secondary | ICD-10-CM | POA: Diagnosis not present

## 2018-11-09 DIAGNOSIS — D631 Anemia in chronic kidney disease: Secondary | ICD-10-CM | POA: Diagnosis not present

## 2018-11-09 DIAGNOSIS — E1121 Type 2 diabetes mellitus with diabetic nephropathy: Secondary | ICD-10-CM | POA: Diagnosis not present

## 2018-11-11 DIAGNOSIS — D631 Anemia in chronic kidney disease: Secondary | ICD-10-CM | POA: Diagnosis not present

## 2018-11-11 DIAGNOSIS — N186 End stage renal disease: Secondary | ICD-10-CM | POA: Diagnosis not present

## 2018-11-11 DIAGNOSIS — E1121 Type 2 diabetes mellitus with diabetic nephropathy: Secondary | ICD-10-CM | POA: Diagnosis not present

## 2018-11-11 DIAGNOSIS — N2581 Secondary hyperparathyroidism of renal origin: Secondary | ICD-10-CM | POA: Diagnosis not present

## 2018-11-13 DIAGNOSIS — N2581 Secondary hyperparathyroidism of renal origin: Secondary | ICD-10-CM | POA: Diagnosis not present

## 2018-11-13 DIAGNOSIS — N186 End stage renal disease: Secondary | ICD-10-CM | POA: Diagnosis not present

## 2018-11-13 DIAGNOSIS — D631 Anemia in chronic kidney disease: Secondary | ICD-10-CM | POA: Diagnosis not present

## 2018-11-13 DIAGNOSIS — E1121 Type 2 diabetes mellitus with diabetic nephropathy: Secondary | ICD-10-CM | POA: Diagnosis not present

## 2018-11-16 DIAGNOSIS — E1121 Type 2 diabetes mellitus with diabetic nephropathy: Secondary | ICD-10-CM | POA: Diagnosis not present

## 2018-11-16 DIAGNOSIS — N186 End stage renal disease: Secondary | ICD-10-CM | POA: Diagnosis not present

## 2018-11-16 DIAGNOSIS — E1122 Type 2 diabetes mellitus with diabetic chronic kidney disease: Secondary | ICD-10-CM | POA: Diagnosis not present

## 2018-11-16 DIAGNOSIS — Z992 Dependence on renal dialysis: Secondary | ICD-10-CM | POA: Diagnosis not present

## 2018-11-16 DIAGNOSIS — D631 Anemia in chronic kidney disease: Secondary | ICD-10-CM | POA: Diagnosis not present

## 2018-11-16 DIAGNOSIS — N2581 Secondary hyperparathyroidism of renal origin: Secondary | ICD-10-CM | POA: Diagnosis not present

## 2018-11-18 DIAGNOSIS — N186 End stage renal disease: Secondary | ICD-10-CM | POA: Diagnosis not present

## 2018-11-18 DIAGNOSIS — D631 Anemia in chronic kidney disease: Secondary | ICD-10-CM | POA: Diagnosis not present

## 2018-11-18 DIAGNOSIS — E1121 Type 2 diabetes mellitus with diabetic nephropathy: Secondary | ICD-10-CM | POA: Diagnosis not present

## 2018-11-18 DIAGNOSIS — N2581 Secondary hyperparathyroidism of renal origin: Secondary | ICD-10-CM | POA: Diagnosis not present

## 2018-11-20 DIAGNOSIS — D631 Anemia in chronic kidney disease: Secondary | ICD-10-CM | POA: Diagnosis not present

## 2018-11-20 DIAGNOSIS — E1121 Type 2 diabetes mellitus with diabetic nephropathy: Secondary | ICD-10-CM | POA: Diagnosis not present

## 2018-11-20 DIAGNOSIS — N2581 Secondary hyperparathyroidism of renal origin: Secondary | ICD-10-CM | POA: Diagnosis not present

## 2018-11-20 DIAGNOSIS — N186 End stage renal disease: Secondary | ICD-10-CM | POA: Diagnosis not present

## 2018-11-23 DIAGNOSIS — E1121 Type 2 diabetes mellitus with diabetic nephropathy: Secondary | ICD-10-CM | POA: Diagnosis not present

## 2018-11-23 DIAGNOSIS — D631 Anemia in chronic kidney disease: Secondary | ICD-10-CM | POA: Diagnosis not present

## 2018-11-23 DIAGNOSIS — N186 End stage renal disease: Secondary | ICD-10-CM | POA: Diagnosis not present

## 2018-11-23 DIAGNOSIS — N2581 Secondary hyperparathyroidism of renal origin: Secondary | ICD-10-CM | POA: Diagnosis not present

## 2018-11-25 DIAGNOSIS — E1121 Type 2 diabetes mellitus with diabetic nephropathy: Secondary | ICD-10-CM | POA: Diagnosis not present

## 2018-11-25 DIAGNOSIS — N186 End stage renal disease: Secondary | ICD-10-CM | POA: Diagnosis not present

## 2018-11-25 DIAGNOSIS — D631 Anemia in chronic kidney disease: Secondary | ICD-10-CM | POA: Diagnosis not present

## 2018-11-25 DIAGNOSIS — N2581 Secondary hyperparathyroidism of renal origin: Secondary | ICD-10-CM | POA: Diagnosis not present

## 2018-11-27 DIAGNOSIS — E1121 Type 2 diabetes mellitus with diabetic nephropathy: Secondary | ICD-10-CM | POA: Diagnosis not present

## 2018-11-27 DIAGNOSIS — D631 Anemia in chronic kidney disease: Secondary | ICD-10-CM | POA: Diagnosis not present

## 2018-11-27 DIAGNOSIS — N186 End stage renal disease: Secondary | ICD-10-CM | POA: Diagnosis not present

## 2018-11-27 DIAGNOSIS — N2581 Secondary hyperparathyroidism of renal origin: Secondary | ICD-10-CM | POA: Diagnosis not present

## 2018-11-30 DIAGNOSIS — D631 Anemia in chronic kidney disease: Secondary | ICD-10-CM | POA: Diagnosis not present

## 2018-11-30 DIAGNOSIS — N2581 Secondary hyperparathyroidism of renal origin: Secondary | ICD-10-CM | POA: Diagnosis not present

## 2018-11-30 DIAGNOSIS — N186 End stage renal disease: Secondary | ICD-10-CM | POA: Diagnosis not present

## 2018-11-30 DIAGNOSIS — E1121 Type 2 diabetes mellitus with diabetic nephropathy: Secondary | ICD-10-CM | POA: Diagnosis not present

## 2018-12-02 DIAGNOSIS — N2581 Secondary hyperparathyroidism of renal origin: Secondary | ICD-10-CM | POA: Diagnosis not present

## 2018-12-02 DIAGNOSIS — D631 Anemia in chronic kidney disease: Secondary | ICD-10-CM | POA: Diagnosis not present

## 2018-12-02 DIAGNOSIS — N186 End stage renal disease: Secondary | ICD-10-CM | POA: Diagnosis not present

## 2018-12-02 DIAGNOSIS — E1121 Type 2 diabetes mellitus with diabetic nephropathy: Secondary | ICD-10-CM | POA: Diagnosis not present

## 2018-12-04 DIAGNOSIS — D631 Anemia in chronic kidney disease: Secondary | ICD-10-CM | POA: Diagnosis not present

## 2018-12-04 DIAGNOSIS — E1121 Type 2 diabetes mellitus with diabetic nephropathy: Secondary | ICD-10-CM | POA: Diagnosis not present

## 2018-12-04 DIAGNOSIS — N186 End stage renal disease: Secondary | ICD-10-CM | POA: Diagnosis not present

## 2018-12-04 DIAGNOSIS — N2581 Secondary hyperparathyroidism of renal origin: Secondary | ICD-10-CM | POA: Diagnosis not present

## 2018-12-07 DIAGNOSIS — D631 Anemia in chronic kidney disease: Secondary | ICD-10-CM | POA: Diagnosis not present

## 2018-12-07 DIAGNOSIS — N2581 Secondary hyperparathyroidism of renal origin: Secondary | ICD-10-CM | POA: Diagnosis not present

## 2018-12-07 DIAGNOSIS — N186 End stage renal disease: Secondary | ICD-10-CM | POA: Diagnosis not present

## 2018-12-07 DIAGNOSIS — E1121 Type 2 diabetes mellitus with diabetic nephropathy: Secondary | ICD-10-CM | POA: Diagnosis not present

## 2018-12-09 DIAGNOSIS — E1121 Type 2 diabetes mellitus with diabetic nephropathy: Secondary | ICD-10-CM | POA: Diagnosis not present

## 2018-12-09 DIAGNOSIS — N2581 Secondary hyperparathyroidism of renal origin: Secondary | ICD-10-CM | POA: Diagnosis not present

## 2018-12-09 DIAGNOSIS — N186 End stage renal disease: Secondary | ICD-10-CM | POA: Diagnosis not present

## 2018-12-09 DIAGNOSIS — D631 Anemia in chronic kidney disease: Secondary | ICD-10-CM | POA: Diagnosis not present

## 2018-12-11 DIAGNOSIS — E1121 Type 2 diabetes mellitus with diabetic nephropathy: Secondary | ICD-10-CM | POA: Diagnosis not present

## 2018-12-11 DIAGNOSIS — D631 Anemia in chronic kidney disease: Secondary | ICD-10-CM | POA: Diagnosis not present

## 2018-12-11 DIAGNOSIS — N186 End stage renal disease: Secondary | ICD-10-CM | POA: Diagnosis not present

## 2018-12-11 DIAGNOSIS — N2581 Secondary hyperparathyroidism of renal origin: Secondary | ICD-10-CM | POA: Diagnosis not present

## 2018-12-14 DIAGNOSIS — E1121 Type 2 diabetes mellitus with diabetic nephropathy: Secondary | ICD-10-CM | POA: Diagnosis not present

## 2018-12-14 DIAGNOSIS — N186 End stage renal disease: Secondary | ICD-10-CM | POA: Diagnosis not present

## 2018-12-14 DIAGNOSIS — D631 Anemia in chronic kidney disease: Secondary | ICD-10-CM | POA: Diagnosis not present

## 2018-12-14 DIAGNOSIS — N2581 Secondary hyperparathyroidism of renal origin: Secondary | ICD-10-CM | POA: Diagnosis not present

## 2018-12-16 DIAGNOSIS — E1121 Type 2 diabetes mellitus with diabetic nephropathy: Secondary | ICD-10-CM | POA: Diagnosis not present

## 2018-12-16 DIAGNOSIS — Z992 Dependence on renal dialysis: Secondary | ICD-10-CM | POA: Diagnosis not present

## 2018-12-16 DIAGNOSIS — N2581 Secondary hyperparathyroidism of renal origin: Secondary | ICD-10-CM | POA: Diagnosis not present

## 2018-12-16 DIAGNOSIS — D631 Anemia in chronic kidney disease: Secondary | ICD-10-CM | POA: Diagnosis not present

## 2018-12-16 DIAGNOSIS — E1122 Type 2 diabetes mellitus with diabetic chronic kidney disease: Secondary | ICD-10-CM | POA: Diagnosis not present

## 2018-12-16 DIAGNOSIS — N186 End stage renal disease: Secondary | ICD-10-CM | POA: Diagnosis not present

## 2018-12-18 DIAGNOSIS — N2581 Secondary hyperparathyroidism of renal origin: Secondary | ICD-10-CM | POA: Diagnosis not present

## 2018-12-18 DIAGNOSIS — N186 End stage renal disease: Secondary | ICD-10-CM | POA: Diagnosis not present

## 2018-12-18 DIAGNOSIS — D631 Anemia in chronic kidney disease: Secondary | ICD-10-CM | POA: Diagnosis not present

## 2018-12-18 DIAGNOSIS — E1121 Type 2 diabetes mellitus with diabetic nephropathy: Secondary | ICD-10-CM | POA: Diagnosis not present

## 2018-12-21 DIAGNOSIS — D631 Anemia in chronic kidney disease: Secondary | ICD-10-CM | POA: Diagnosis not present

## 2018-12-21 DIAGNOSIS — N2581 Secondary hyperparathyroidism of renal origin: Secondary | ICD-10-CM | POA: Diagnosis not present

## 2018-12-21 DIAGNOSIS — N186 End stage renal disease: Secondary | ICD-10-CM | POA: Diagnosis not present

## 2018-12-21 DIAGNOSIS — E1121 Type 2 diabetes mellitus with diabetic nephropathy: Secondary | ICD-10-CM | POA: Diagnosis not present

## 2018-12-22 DIAGNOSIS — I871 Compression of vein: Secondary | ICD-10-CM | POA: Diagnosis not present

## 2018-12-22 DIAGNOSIS — T82858A Stenosis of vascular prosthetic devices, implants and grafts, initial encounter: Secondary | ICD-10-CM | POA: Diagnosis not present

## 2018-12-22 DIAGNOSIS — N186 End stage renal disease: Secondary | ICD-10-CM | POA: Diagnosis not present

## 2018-12-22 DIAGNOSIS — Z992 Dependence on renal dialysis: Secondary | ICD-10-CM | POA: Diagnosis not present

## 2018-12-23 DIAGNOSIS — D631 Anemia in chronic kidney disease: Secondary | ICD-10-CM | POA: Diagnosis not present

## 2018-12-23 DIAGNOSIS — N2581 Secondary hyperparathyroidism of renal origin: Secondary | ICD-10-CM | POA: Diagnosis not present

## 2018-12-23 DIAGNOSIS — N186 End stage renal disease: Secondary | ICD-10-CM | POA: Diagnosis not present

## 2018-12-23 DIAGNOSIS — E1121 Type 2 diabetes mellitus with diabetic nephropathy: Secondary | ICD-10-CM | POA: Diagnosis not present

## 2018-12-25 DIAGNOSIS — N186 End stage renal disease: Secondary | ICD-10-CM | POA: Diagnosis not present

## 2018-12-25 DIAGNOSIS — E1121 Type 2 diabetes mellitus with diabetic nephropathy: Secondary | ICD-10-CM | POA: Diagnosis not present

## 2018-12-25 DIAGNOSIS — N2581 Secondary hyperparathyroidism of renal origin: Secondary | ICD-10-CM | POA: Diagnosis not present

## 2018-12-25 DIAGNOSIS — D631 Anemia in chronic kidney disease: Secondary | ICD-10-CM | POA: Diagnosis not present

## 2018-12-28 DIAGNOSIS — E1121 Type 2 diabetes mellitus with diabetic nephropathy: Secondary | ICD-10-CM | POA: Diagnosis not present

## 2018-12-28 DIAGNOSIS — N2581 Secondary hyperparathyroidism of renal origin: Secondary | ICD-10-CM | POA: Diagnosis not present

## 2018-12-28 DIAGNOSIS — N186 End stage renal disease: Secondary | ICD-10-CM | POA: Diagnosis not present

## 2018-12-28 DIAGNOSIS — D631 Anemia in chronic kidney disease: Secondary | ICD-10-CM | POA: Diagnosis not present

## 2018-12-30 DIAGNOSIS — E1121 Type 2 diabetes mellitus with diabetic nephropathy: Secondary | ICD-10-CM | POA: Diagnosis not present

## 2018-12-30 DIAGNOSIS — N2581 Secondary hyperparathyroidism of renal origin: Secondary | ICD-10-CM | POA: Diagnosis not present

## 2018-12-30 DIAGNOSIS — D631 Anemia in chronic kidney disease: Secondary | ICD-10-CM | POA: Diagnosis not present

## 2018-12-30 DIAGNOSIS — N186 End stage renal disease: Secondary | ICD-10-CM | POA: Diagnosis not present

## 2019-01-01 DIAGNOSIS — E1121 Type 2 diabetes mellitus with diabetic nephropathy: Secondary | ICD-10-CM | POA: Diagnosis not present

## 2019-01-01 DIAGNOSIS — D631 Anemia in chronic kidney disease: Secondary | ICD-10-CM | POA: Diagnosis not present

## 2019-01-01 DIAGNOSIS — N2581 Secondary hyperparathyroidism of renal origin: Secondary | ICD-10-CM | POA: Diagnosis not present

## 2019-01-01 DIAGNOSIS — N186 End stage renal disease: Secondary | ICD-10-CM | POA: Diagnosis not present

## 2019-01-04 DIAGNOSIS — D631 Anemia in chronic kidney disease: Secondary | ICD-10-CM | POA: Diagnosis not present

## 2019-01-04 DIAGNOSIS — N2581 Secondary hyperparathyroidism of renal origin: Secondary | ICD-10-CM | POA: Diagnosis not present

## 2019-01-04 DIAGNOSIS — E1121 Type 2 diabetes mellitus with diabetic nephropathy: Secondary | ICD-10-CM | POA: Diagnosis not present

## 2019-01-04 DIAGNOSIS — N186 End stage renal disease: Secondary | ICD-10-CM | POA: Diagnosis not present

## 2019-01-06 DIAGNOSIS — N2581 Secondary hyperparathyroidism of renal origin: Secondary | ICD-10-CM | POA: Diagnosis not present

## 2019-01-06 DIAGNOSIS — N186 End stage renal disease: Secondary | ICD-10-CM | POA: Diagnosis not present

## 2019-01-06 DIAGNOSIS — E1121 Type 2 diabetes mellitus with diabetic nephropathy: Secondary | ICD-10-CM | POA: Diagnosis not present

## 2019-01-06 DIAGNOSIS — D631 Anemia in chronic kidney disease: Secondary | ICD-10-CM | POA: Diagnosis not present

## 2019-01-08 DIAGNOSIS — D631 Anemia in chronic kidney disease: Secondary | ICD-10-CM | POA: Diagnosis not present

## 2019-01-08 DIAGNOSIS — E1121 Type 2 diabetes mellitus with diabetic nephropathy: Secondary | ICD-10-CM | POA: Diagnosis not present

## 2019-01-08 DIAGNOSIS — N186 End stage renal disease: Secondary | ICD-10-CM | POA: Diagnosis not present

## 2019-01-08 DIAGNOSIS — N2581 Secondary hyperparathyroidism of renal origin: Secondary | ICD-10-CM | POA: Diagnosis not present

## 2019-01-11 DIAGNOSIS — N186 End stage renal disease: Secondary | ICD-10-CM | POA: Diagnosis not present

## 2019-01-11 DIAGNOSIS — D631 Anemia in chronic kidney disease: Secondary | ICD-10-CM | POA: Diagnosis not present

## 2019-01-11 DIAGNOSIS — N2581 Secondary hyperparathyroidism of renal origin: Secondary | ICD-10-CM | POA: Diagnosis not present

## 2019-01-11 DIAGNOSIS — E1121 Type 2 diabetes mellitus with diabetic nephropathy: Secondary | ICD-10-CM | POA: Diagnosis not present

## 2019-01-13 DIAGNOSIS — N2581 Secondary hyperparathyroidism of renal origin: Secondary | ICD-10-CM | POA: Diagnosis not present

## 2019-01-13 DIAGNOSIS — D631 Anemia in chronic kidney disease: Secondary | ICD-10-CM | POA: Diagnosis not present

## 2019-01-13 DIAGNOSIS — E1121 Type 2 diabetes mellitus with diabetic nephropathy: Secondary | ICD-10-CM | POA: Diagnosis not present

## 2019-01-13 DIAGNOSIS — N186 End stage renal disease: Secondary | ICD-10-CM | POA: Diagnosis not present

## 2019-01-14 DIAGNOSIS — C642 Malignant neoplasm of left kidney, except renal pelvis: Secondary | ICD-10-CM | POA: Diagnosis not present

## 2019-01-14 DIAGNOSIS — Z85528 Personal history of other malignant neoplasm of kidney: Secondary | ICD-10-CM | POA: Diagnosis not present

## 2019-01-14 DIAGNOSIS — N2889 Other specified disorders of kidney and ureter: Secondary | ICD-10-CM | POA: Diagnosis not present

## 2019-01-14 DIAGNOSIS — Z08 Encounter for follow-up examination after completed treatment for malignant neoplasm: Secondary | ICD-10-CM | POA: Diagnosis not present

## 2019-01-15 DIAGNOSIS — D631 Anemia in chronic kidney disease: Secondary | ICD-10-CM | POA: Diagnosis not present

## 2019-01-15 DIAGNOSIS — E1121 Type 2 diabetes mellitus with diabetic nephropathy: Secondary | ICD-10-CM | POA: Diagnosis not present

## 2019-01-15 DIAGNOSIS — N186 End stage renal disease: Secondary | ICD-10-CM | POA: Diagnosis not present

## 2019-01-15 DIAGNOSIS — N2581 Secondary hyperparathyroidism of renal origin: Secondary | ICD-10-CM | POA: Diagnosis not present

## 2019-01-16 DIAGNOSIS — N186 End stage renal disease: Secondary | ICD-10-CM | POA: Diagnosis not present

## 2019-01-16 DIAGNOSIS — Z992 Dependence on renal dialysis: Secondary | ICD-10-CM | POA: Diagnosis not present

## 2019-01-16 DIAGNOSIS — E1122 Type 2 diabetes mellitus with diabetic chronic kidney disease: Secondary | ICD-10-CM | POA: Diagnosis not present

## 2019-01-18 DIAGNOSIS — N2581 Secondary hyperparathyroidism of renal origin: Secondary | ICD-10-CM | POA: Diagnosis not present

## 2019-01-18 DIAGNOSIS — E1121 Type 2 diabetes mellitus with diabetic nephropathy: Secondary | ICD-10-CM | POA: Diagnosis not present

## 2019-01-18 DIAGNOSIS — Z992 Dependence on renal dialysis: Secondary | ICD-10-CM | POA: Diagnosis not present

## 2019-01-18 DIAGNOSIS — N186 End stage renal disease: Secondary | ICD-10-CM | POA: Diagnosis not present

## 2019-01-18 DIAGNOSIS — D631 Anemia in chronic kidney disease: Secondary | ICD-10-CM | POA: Diagnosis not present

## 2019-01-20 DIAGNOSIS — Z992 Dependence on renal dialysis: Secondary | ICD-10-CM | POA: Diagnosis not present

## 2019-01-20 DIAGNOSIS — E1121 Type 2 diabetes mellitus with diabetic nephropathy: Secondary | ICD-10-CM | POA: Diagnosis not present

## 2019-01-20 DIAGNOSIS — N2581 Secondary hyperparathyroidism of renal origin: Secondary | ICD-10-CM | POA: Diagnosis not present

## 2019-01-20 DIAGNOSIS — N186 End stage renal disease: Secondary | ICD-10-CM | POA: Diagnosis not present

## 2019-01-20 DIAGNOSIS — D631 Anemia in chronic kidney disease: Secondary | ICD-10-CM | POA: Diagnosis not present

## 2019-01-22 DIAGNOSIS — Z992 Dependence on renal dialysis: Secondary | ICD-10-CM | POA: Diagnosis not present

## 2019-01-22 DIAGNOSIS — E1121 Type 2 diabetes mellitus with diabetic nephropathy: Secondary | ICD-10-CM | POA: Diagnosis not present

## 2019-01-22 DIAGNOSIS — N186 End stage renal disease: Secondary | ICD-10-CM | POA: Diagnosis not present

## 2019-01-22 DIAGNOSIS — N2581 Secondary hyperparathyroidism of renal origin: Secondary | ICD-10-CM | POA: Diagnosis not present

## 2019-01-22 DIAGNOSIS — D631 Anemia in chronic kidney disease: Secondary | ICD-10-CM | POA: Diagnosis not present

## 2019-01-25 DIAGNOSIS — Z992 Dependence on renal dialysis: Secondary | ICD-10-CM | POA: Diagnosis not present

## 2019-01-25 DIAGNOSIS — D631 Anemia in chronic kidney disease: Secondary | ICD-10-CM | POA: Diagnosis not present

## 2019-01-25 DIAGNOSIS — N2581 Secondary hyperparathyroidism of renal origin: Secondary | ICD-10-CM | POA: Diagnosis not present

## 2019-01-25 DIAGNOSIS — N186 End stage renal disease: Secondary | ICD-10-CM | POA: Diagnosis not present

## 2019-01-25 DIAGNOSIS — E1121 Type 2 diabetes mellitus with diabetic nephropathy: Secondary | ICD-10-CM | POA: Diagnosis not present

## 2019-01-27 DIAGNOSIS — N186 End stage renal disease: Secondary | ICD-10-CM | POA: Diagnosis not present

## 2019-01-27 DIAGNOSIS — E1121 Type 2 diabetes mellitus with diabetic nephropathy: Secondary | ICD-10-CM | POA: Diagnosis not present

## 2019-01-27 DIAGNOSIS — Z992 Dependence on renal dialysis: Secondary | ICD-10-CM | POA: Diagnosis not present

## 2019-01-27 DIAGNOSIS — N2581 Secondary hyperparathyroidism of renal origin: Secondary | ICD-10-CM | POA: Diagnosis not present

## 2019-01-27 DIAGNOSIS — D631 Anemia in chronic kidney disease: Secondary | ICD-10-CM | POA: Diagnosis not present

## 2019-01-29 DIAGNOSIS — E1121 Type 2 diabetes mellitus with diabetic nephropathy: Secondary | ICD-10-CM | POA: Diagnosis not present

## 2019-01-29 DIAGNOSIS — Z992 Dependence on renal dialysis: Secondary | ICD-10-CM | POA: Diagnosis not present

## 2019-01-29 DIAGNOSIS — D631 Anemia in chronic kidney disease: Secondary | ICD-10-CM | POA: Diagnosis not present

## 2019-01-29 DIAGNOSIS — N2581 Secondary hyperparathyroidism of renal origin: Secondary | ICD-10-CM | POA: Diagnosis not present

## 2019-01-29 DIAGNOSIS — N186 End stage renal disease: Secondary | ICD-10-CM | POA: Diagnosis not present

## 2019-02-01 DIAGNOSIS — D631 Anemia in chronic kidney disease: Secondary | ICD-10-CM | POA: Diagnosis not present

## 2019-02-01 DIAGNOSIS — E1121 Type 2 diabetes mellitus with diabetic nephropathy: Secondary | ICD-10-CM | POA: Diagnosis not present

## 2019-02-01 DIAGNOSIS — N2581 Secondary hyperparathyroidism of renal origin: Secondary | ICD-10-CM | POA: Diagnosis not present

## 2019-02-01 DIAGNOSIS — N186 End stage renal disease: Secondary | ICD-10-CM | POA: Diagnosis not present

## 2019-02-01 DIAGNOSIS — Z992 Dependence on renal dialysis: Secondary | ICD-10-CM | POA: Diagnosis not present

## 2019-02-02 DIAGNOSIS — N289 Disorder of kidney and ureter, unspecified: Secondary | ICD-10-CM | POA: Diagnosis not present

## 2019-02-03 DIAGNOSIS — N186 End stage renal disease: Secondary | ICD-10-CM | POA: Diagnosis not present

## 2019-02-03 DIAGNOSIS — Z992 Dependence on renal dialysis: Secondary | ICD-10-CM | POA: Diagnosis not present

## 2019-02-03 DIAGNOSIS — D631 Anemia in chronic kidney disease: Secondary | ICD-10-CM | POA: Diagnosis not present

## 2019-02-03 DIAGNOSIS — E1121 Type 2 diabetes mellitus with diabetic nephropathy: Secondary | ICD-10-CM | POA: Diagnosis not present

## 2019-02-03 DIAGNOSIS — N2581 Secondary hyperparathyroidism of renal origin: Secondary | ICD-10-CM | POA: Diagnosis not present

## 2019-02-05 DIAGNOSIS — N186 End stage renal disease: Secondary | ICD-10-CM | POA: Diagnosis not present

## 2019-02-05 DIAGNOSIS — E1121 Type 2 diabetes mellitus with diabetic nephropathy: Secondary | ICD-10-CM | POA: Diagnosis not present

## 2019-02-05 DIAGNOSIS — Z992 Dependence on renal dialysis: Secondary | ICD-10-CM | POA: Diagnosis not present

## 2019-02-05 DIAGNOSIS — N2581 Secondary hyperparathyroidism of renal origin: Secondary | ICD-10-CM | POA: Diagnosis not present

## 2019-02-05 DIAGNOSIS — D631 Anemia in chronic kidney disease: Secondary | ICD-10-CM | POA: Diagnosis not present

## 2019-02-08 DIAGNOSIS — E1121 Type 2 diabetes mellitus with diabetic nephropathy: Secondary | ICD-10-CM | POA: Diagnosis not present

## 2019-02-08 DIAGNOSIS — Z992 Dependence on renal dialysis: Secondary | ICD-10-CM | POA: Diagnosis not present

## 2019-02-08 DIAGNOSIS — D631 Anemia in chronic kidney disease: Secondary | ICD-10-CM | POA: Diagnosis not present

## 2019-02-08 DIAGNOSIS — N186 End stage renal disease: Secondary | ICD-10-CM | POA: Diagnosis not present

## 2019-02-08 DIAGNOSIS — N2581 Secondary hyperparathyroidism of renal origin: Secondary | ICD-10-CM | POA: Diagnosis not present

## 2019-02-10 DIAGNOSIS — N2581 Secondary hyperparathyroidism of renal origin: Secondary | ICD-10-CM | POA: Diagnosis not present

## 2019-02-10 DIAGNOSIS — E1121 Type 2 diabetes mellitus with diabetic nephropathy: Secondary | ICD-10-CM | POA: Diagnosis not present

## 2019-02-10 DIAGNOSIS — Z992 Dependence on renal dialysis: Secondary | ICD-10-CM | POA: Diagnosis not present

## 2019-02-10 DIAGNOSIS — N186 End stage renal disease: Secondary | ICD-10-CM | POA: Diagnosis not present

## 2019-02-10 DIAGNOSIS — D631 Anemia in chronic kidney disease: Secondary | ICD-10-CM | POA: Diagnosis not present

## 2019-02-12 DIAGNOSIS — N2581 Secondary hyperparathyroidism of renal origin: Secondary | ICD-10-CM | POA: Diagnosis not present

## 2019-02-12 DIAGNOSIS — E1121 Type 2 diabetes mellitus with diabetic nephropathy: Secondary | ICD-10-CM | POA: Diagnosis not present

## 2019-02-12 DIAGNOSIS — Z992 Dependence on renal dialysis: Secondary | ICD-10-CM | POA: Diagnosis not present

## 2019-02-12 DIAGNOSIS — D631 Anemia in chronic kidney disease: Secondary | ICD-10-CM | POA: Diagnosis not present

## 2019-02-12 DIAGNOSIS — N186 End stage renal disease: Secondary | ICD-10-CM | POA: Diagnosis not present

## 2019-02-15 DIAGNOSIS — E1121 Type 2 diabetes mellitus with diabetic nephropathy: Secondary | ICD-10-CM | POA: Diagnosis not present

## 2019-02-15 DIAGNOSIS — D631 Anemia in chronic kidney disease: Secondary | ICD-10-CM | POA: Diagnosis not present

## 2019-02-15 DIAGNOSIS — Z992 Dependence on renal dialysis: Secondary | ICD-10-CM | POA: Diagnosis not present

## 2019-02-15 DIAGNOSIS — N2581 Secondary hyperparathyroidism of renal origin: Secondary | ICD-10-CM | POA: Diagnosis not present

## 2019-02-15 DIAGNOSIS — N186 End stage renal disease: Secondary | ICD-10-CM | POA: Diagnosis not present

## 2019-02-16 DIAGNOSIS — Z992 Dependence on renal dialysis: Secondary | ICD-10-CM | POA: Diagnosis not present

## 2019-02-16 DIAGNOSIS — N186 End stage renal disease: Secondary | ICD-10-CM | POA: Diagnosis not present

## 2019-02-16 DIAGNOSIS — E1122 Type 2 diabetes mellitus with diabetic chronic kidney disease: Secondary | ICD-10-CM | POA: Diagnosis not present

## 2019-02-17 DIAGNOSIS — Z23 Encounter for immunization: Secondary | ICD-10-CM | POA: Diagnosis not present

## 2019-02-17 DIAGNOSIS — Z992 Dependence on renal dialysis: Secondary | ICD-10-CM | POA: Diagnosis not present

## 2019-02-17 DIAGNOSIS — E1121 Type 2 diabetes mellitus with diabetic nephropathy: Secondary | ICD-10-CM | POA: Diagnosis not present

## 2019-02-17 DIAGNOSIS — N2581 Secondary hyperparathyroidism of renal origin: Secondary | ICD-10-CM | POA: Diagnosis not present

## 2019-02-17 DIAGNOSIS — D631 Anemia in chronic kidney disease: Secondary | ICD-10-CM | POA: Diagnosis not present

## 2019-02-17 DIAGNOSIS — N186 End stage renal disease: Secondary | ICD-10-CM | POA: Diagnosis not present

## 2019-02-19 DIAGNOSIS — E1121 Type 2 diabetes mellitus with diabetic nephropathy: Secondary | ICD-10-CM | POA: Diagnosis not present

## 2019-02-19 DIAGNOSIS — D631 Anemia in chronic kidney disease: Secondary | ICD-10-CM | POA: Diagnosis not present

## 2019-02-19 DIAGNOSIS — Z23 Encounter for immunization: Secondary | ICD-10-CM | POA: Diagnosis not present

## 2019-02-19 DIAGNOSIS — Z992 Dependence on renal dialysis: Secondary | ICD-10-CM | POA: Diagnosis not present

## 2019-02-19 DIAGNOSIS — N186 End stage renal disease: Secondary | ICD-10-CM | POA: Diagnosis not present

## 2019-02-19 DIAGNOSIS — N2581 Secondary hyperparathyroidism of renal origin: Secondary | ICD-10-CM | POA: Diagnosis not present

## 2019-02-22 DIAGNOSIS — D631 Anemia in chronic kidney disease: Secondary | ICD-10-CM | POA: Diagnosis not present

## 2019-02-22 DIAGNOSIS — N2581 Secondary hyperparathyroidism of renal origin: Secondary | ICD-10-CM | POA: Diagnosis not present

## 2019-02-22 DIAGNOSIS — Z992 Dependence on renal dialysis: Secondary | ICD-10-CM | POA: Diagnosis not present

## 2019-02-22 DIAGNOSIS — E1121 Type 2 diabetes mellitus with diabetic nephropathy: Secondary | ICD-10-CM | POA: Diagnosis not present

## 2019-02-22 DIAGNOSIS — Z23 Encounter for immunization: Secondary | ICD-10-CM | POA: Diagnosis not present

## 2019-02-22 DIAGNOSIS — N186 End stage renal disease: Secondary | ICD-10-CM | POA: Diagnosis not present

## 2019-02-24 DIAGNOSIS — N186 End stage renal disease: Secondary | ICD-10-CM | POA: Diagnosis not present

## 2019-02-24 DIAGNOSIS — N2581 Secondary hyperparathyroidism of renal origin: Secondary | ICD-10-CM | POA: Diagnosis not present

## 2019-02-24 DIAGNOSIS — E1121 Type 2 diabetes mellitus with diabetic nephropathy: Secondary | ICD-10-CM | POA: Diagnosis not present

## 2019-02-24 DIAGNOSIS — D631 Anemia in chronic kidney disease: Secondary | ICD-10-CM | POA: Diagnosis not present

## 2019-02-24 DIAGNOSIS — Z992 Dependence on renal dialysis: Secondary | ICD-10-CM | POA: Diagnosis not present

## 2019-02-24 DIAGNOSIS — Z23 Encounter for immunization: Secondary | ICD-10-CM | POA: Diagnosis not present

## 2019-02-26 DIAGNOSIS — Z992 Dependence on renal dialysis: Secondary | ICD-10-CM | POA: Diagnosis not present

## 2019-02-26 DIAGNOSIS — D631 Anemia in chronic kidney disease: Secondary | ICD-10-CM | POA: Diagnosis not present

## 2019-02-26 DIAGNOSIS — E1121 Type 2 diabetes mellitus with diabetic nephropathy: Secondary | ICD-10-CM | POA: Diagnosis not present

## 2019-02-26 DIAGNOSIS — Z23 Encounter for immunization: Secondary | ICD-10-CM | POA: Diagnosis not present

## 2019-02-26 DIAGNOSIS — N186 End stage renal disease: Secondary | ICD-10-CM | POA: Diagnosis not present

## 2019-02-26 DIAGNOSIS — N2581 Secondary hyperparathyroidism of renal origin: Secondary | ICD-10-CM | POA: Diagnosis not present

## 2019-03-01 DIAGNOSIS — N186 End stage renal disease: Secondary | ICD-10-CM | POA: Diagnosis not present

## 2019-03-01 DIAGNOSIS — Z23 Encounter for immunization: Secondary | ICD-10-CM | POA: Diagnosis not present

## 2019-03-01 DIAGNOSIS — E1121 Type 2 diabetes mellitus with diabetic nephropathy: Secondary | ICD-10-CM | POA: Diagnosis not present

## 2019-03-01 DIAGNOSIS — Z992 Dependence on renal dialysis: Secondary | ICD-10-CM | POA: Diagnosis not present

## 2019-03-01 DIAGNOSIS — N2581 Secondary hyperparathyroidism of renal origin: Secondary | ICD-10-CM | POA: Diagnosis not present

## 2019-03-01 DIAGNOSIS — D631 Anemia in chronic kidney disease: Secondary | ICD-10-CM | POA: Diagnosis not present

## 2019-03-03 DIAGNOSIS — Z992 Dependence on renal dialysis: Secondary | ICD-10-CM | POA: Diagnosis not present

## 2019-03-03 DIAGNOSIS — D631 Anemia in chronic kidney disease: Secondary | ICD-10-CM | POA: Diagnosis not present

## 2019-03-03 DIAGNOSIS — E1121 Type 2 diabetes mellitus with diabetic nephropathy: Secondary | ICD-10-CM | POA: Diagnosis not present

## 2019-03-03 DIAGNOSIS — N2581 Secondary hyperparathyroidism of renal origin: Secondary | ICD-10-CM | POA: Diagnosis not present

## 2019-03-03 DIAGNOSIS — Z23 Encounter for immunization: Secondary | ICD-10-CM | POA: Diagnosis not present

## 2019-03-03 DIAGNOSIS — N186 End stage renal disease: Secondary | ICD-10-CM | POA: Diagnosis not present

## 2019-03-05 DIAGNOSIS — E1121 Type 2 diabetes mellitus with diabetic nephropathy: Secondary | ICD-10-CM | POA: Diagnosis not present

## 2019-03-05 DIAGNOSIS — D631 Anemia in chronic kidney disease: Secondary | ICD-10-CM | POA: Diagnosis not present

## 2019-03-05 DIAGNOSIS — N2581 Secondary hyperparathyroidism of renal origin: Secondary | ICD-10-CM | POA: Diagnosis not present

## 2019-03-05 DIAGNOSIS — Z23 Encounter for immunization: Secondary | ICD-10-CM | POA: Diagnosis not present

## 2019-03-05 DIAGNOSIS — N186 End stage renal disease: Secondary | ICD-10-CM | POA: Diagnosis not present

## 2019-03-05 DIAGNOSIS — Z992 Dependence on renal dialysis: Secondary | ICD-10-CM | POA: Diagnosis not present

## 2019-03-08 DIAGNOSIS — N2581 Secondary hyperparathyroidism of renal origin: Secondary | ICD-10-CM | POA: Diagnosis not present

## 2019-03-08 DIAGNOSIS — N186 End stage renal disease: Secondary | ICD-10-CM | POA: Diagnosis not present

## 2019-03-08 DIAGNOSIS — Z992 Dependence on renal dialysis: Secondary | ICD-10-CM | POA: Diagnosis not present

## 2019-03-08 DIAGNOSIS — D631 Anemia in chronic kidney disease: Secondary | ICD-10-CM | POA: Diagnosis not present

## 2019-03-08 DIAGNOSIS — Z23 Encounter for immunization: Secondary | ICD-10-CM | POA: Diagnosis not present

## 2019-03-08 DIAGNOSIS — E1121 Type 2 diabetes mellitus with diabetic nephropathy: Secondary | ICD-10-CM | POA: Diagnosis not present

## 2019-03-10 DIAGNOSIS — N2581 Secondary hyperparathyroidism of renal origin: Secondary | ICD-10-CM | POA: Diagnosis not present

## 2019-03-10 DIAGNOSIS — Z23 Encounter for immunization: Secondary | ICD-10-CM | POA: Diagnosis not present

## 2019-03-10 DIAGNOSIS — D631 Anemia in chronic kidney disease: Secondary | ICD-10-CM | POA: Diagnosis not present

## 2019-03-10 DIAGNOSIS — N186 End stage renal disease: Secondary | ICD-10-CM | POA: Diagnosis not present

## 2019-03-10 DIAGNOSIS — Z992 Dependence on renal dialysis: Secondary | ICD-10-CM | POA: Diagnosis not present

## 2019-03-10 DIAGNOSIS — E1121 Type 2 diabetes mellitus with diabetic nephropathy: Secondary | ICD-10-CM | POA: Diagnosis not present

## 2019-03-12 DIAGNOSIS — Z23 Encounter for immunization: Secondary | ICD-10-CM | POA: Diagnosis not present

## 2019-03-12 DIAGNOSIS — E1121 Type 2 diabetes mellitus with diabetic nephropathy: Secondary | ICD-10-CM | POA: Diagnosis not present

## 2019-03-12 DIAGNOSIS — Z992 Dependence on renal dialysis: Secondary | ICD-10-CM | POA: Diagnosis not present

## 2019-03-12 DIAGNOSIS — D631 Anemia in chronic kidney disease: Secondary | ICD-10-CM | POA: Diagnosis not present

## 2019-03-12 DIAGNOSIS — N2581 Secondary hyperparathyroidism of renal origin: Secondary | ICD-10-CM | POA: Diagnosis not present

## 2019-03-12 DIAGNOSIS — N186 End stage renal disease: Secondary | ICD-10-CM | POA: Diagnosis not present

## 2019-03-15 DIAGNOSIS — Z23 Encounter for immunization: Secondary | ICD-10-CM | POA: Diagnosis not present

## 2019-03-15 DIAGNOSIS — Z992 Dependence on renal dialysis: Secondary | ICD-10-CM | POA: Diagnosis not present

## 2019-03-15 DIAGNOSIS — N2581 Secondary hyperparathyroidism of renal origin: Secondary | ICD-10-CM | POA: Diagnosis not present

## 2019-03-15 DIAGNOSIS — E1121 Type 2 diabetes mellitus with diabetic nephropathy: Secondary | ICD-10-CM | POA: Diagnosis not present

## 2019-03-15 DIAGNOSIS — N186 End stage renal disease: Secondary | ICD-10-CM | POA: Diagnosis not present

## 2019-03-15 DIAGNOSIS — D631 Anemia in chronic kidney disease: Secondary | ICD-10-CM | POA: Diagnosis not present

## 2019-03-17 DIAGNOSIS — E1121 Type 2 diabetes mellitus with diabetic nephropathy: Secondary | ICD-10-CM | POA: Diagnosis not present

## 2019-03-17 DIAGNOSIS — Z992 Dependence on renal dialysis: Secondary | ICD-10-CM | POA: Diagnosis not present

## 2019-03-17 DIAGNOSIS — N2581 Secondary hyperparathyroidism of renal origin: Secondary | ICD-10-CM | POA: Diagnosis not present

## 2019-03-17 DIAGNOSIS — N186 End stage renal disease: Secondary | ICD-10-CM | POA: Diagnosis not present

## 2019-03-17 DIAGNOSIS — D631 Anemia in chronic kidney disease: Secondary | ICD-10-CM | POA: Diagnosis not present

## 2019-03-17 DIAGNOSIS — Z23 Encounter for immunization: Secondary | ICD-10-CM | POA: Diagnosis not present

## 2019-03-18 DIAGNOSIS — E1122 Type 2 diabetes mellitus with diabetic chronic kidney disease: Secondary | ICD-10-CM | POA: Diagnosis not present

## 2019-03-18 DIAGNOSIS — Z992 Dependence on renal dialysis: Secondary | ICD-10-CM | POA: Diagnosis not present

## 2019-03-18 DIAGNOSIS — N186 End stage renal disease: Secondary | ICD-10-CM | POA: Diagnosis not present

## 2019-03-19 DIAGNOSIS — N2581 Secondary hyperparathyroidism of renal origin: Secondary | ICD-10-CM | POA: Diagnosis not present

## 2019-03-19 DIAGNOSIS — Z992 Dependence on renal dialysis: Secondary | ICD-10-CM | POA: Diagnosis not present

## 2019-03-19 DIAGNOSIS — D631 Anemia in chronic kidney disease: Secondary | ICD-10-CM | POA: Diagnosis not present

## 2019-03-19 DIAGNOSIS — N186 End stage renal disease: Secondary | ICD-10-CM | POA: Diagnosis not present

## 2019-03-22 DIAGNOSIS — Z992 Dependence on renal dialysis: Secondary | ICD-10-CM | POA: Diagnosis not present

## 2019-03-22 DIAGNOSIS — N2581 Secondary hyperparathyroidism of renal origin: Secondary | ICD-10-CM | POA: Diagnosis not present

## 2019-03-22 DIAGNOSIS — D631 Anemia in chronic kidney disease: Secondary | ICD-10-CM | POA: Diagnosis not present

## 2019-03-22 DIAGNOSIS — N186 End stage renal disease: Secondary | ICD-10-CM | POA: Diagnosis not present

## 2019-03-24 DIAGNOSIS — N186 End stage renal disease: Secondary | ICD-10-CM | POA: Diagnosis not present

## 2019-03-24 DIAGNOSIS — N2581 Secondary hyperparathyroidism of renal origin: Secondary | ICD-10-CM | POA: Diagnosis not present

## 2019-03-24 DIAGNOSIS — Z992 Dependence on renal dialysis: Secondary | ICD-10-CM | POA: Diagnosis not present

## 2019-03-24 DIAGNOSIS — D631 Anemia in chronic kidney disease: Secondary | ICD-10-CM | POA: Diagnosis not present

## 2019-03-26 DIAGNOSIS — N186 End stage renal disease: Secondary | ICD-10-CM | POA: Diagnosis not present

## 2019-03-26 DIAGNOSIS — Z992 Dependence on renal dialysis: Secondary | ICD-10-CM | POA: Diagnosis not present

## 2019-03-26 DIAGNOSIS — N2581 Secondary hyperparathyroidism of renal origin: Secondary | ICD-10-CM | POA: Diagnosis not present

## 2019-03-26 DIAGNOSIS — D631 Anemia in chronic kidney disease: Secondary | ICD-10-CM | POA: Diagnosis not present

## 2019-03-29 DIAGNOSIS — D631 Anemia in chronic kidney disease: Secondary | ICD-10-CM | POA: Diagnosis not present

## 2019-03-29 DIAGNOSIS — Z992 Dependence on renal dialysis: Secondary | ICD-10-CM | POA: Diagnosis not present

## 2019-03-29 DIAGNOSIS — N186 End stage renal disease: Secondary | ICD-10-CM | POA: Diagnosis not present

## 2019-03-29 DIAGNOSIS — N2581 Secondary hyperparathyroidism of renal origin: Secondary | ICD-10-CM | POA: Diagnosis not present

## 2019-03-31 DIAGNOSIS — D631 Anemia in chronic kidney disease: Secondary | ICD-10-CM | POA: Diagnosis not present

## 2019-03-31 DIAGNOSIS — N2581 Secondary hyperparathyroidism of renal origin: Secondary | ICD-10-CM | POA: Diagnosis not present

## 2019-03-31 DIAGNOSIS — N186 End stage renal disease: Secondary | ICD-10-CM | POA: Diagnosis not present

## 2019-03-31 DIAGNOSIS — Z992 Dependence on renal dialysis: Secondary | ICD-10-CM | POA: Diagnosis not present

## 2019-04-02 DIAGNOSIS — N186 End stage renal disease: Secondary | ICD-10-CM | POA: Diagnosis not present

## 2019-04-02 DIAGNOSIS — Z992 Dependence on renal dialysis: Secondary | ICD-10-CM | POA: Diagnosis not present

## 2019-04-02 DIAGNOSIS — D631 Anemia in chronic kidney disease: Secondary | ICD-10-CM | POA: Diagnosis not present

## 2019-04-02 DIAGNOSIS — N2581 Secondary hyperparathyroidism of renal origin: Secondary | ICD-10-CM | POA: Diagnosis not present

## 2019-04-05 DIAGNOSIS — N186 End stage renal disease: Secondary | ICD-10-CM | POA: Diagnosis not present

## 2019-04-05 DIAGNOSIS — D631 Anemia in chronic kidney disease: Secondary | ICD-10-CM | POA: Diagnosis not present

## 2019-04-05 DIAGNOSIS — Z992 Dependence on renal dialysis: Secondary | ICD-10-CM | POA: Diagnosis not present

## 2019-04-05 DIAGNOSIS — N2581 Secondary hyperparathyroidism of renal origin: Secondary | ICD-10-CM | POA: Diagnosis not present

## 2019-04-07 DIAGNOSIS — N186 End stage renal disease: Secondary | ICD-10-CM | POA: Diagnosis not present

## 2019-04-07 DIAGNOSIS — N2581 Secondary hyperparathyroidism of renal origin: Secondary | ICD-10-CM | POA: Diagnosis not present

## 2019-04-07 DIAGNOSIS — D631 Anemia in chronic kidney disease: Secondary | ICD-10-CM | POA: Diagnosis not present

## 2019-04-07 DIAGNOSIS — Z992 Dependence on renal dialysis: Secondary | ICD-10-CM | POA: Diagnosis not present

## 2019-04-09 DIAGNOSIS — Z992 Dependence on renal dialysis: Secondary | ICD-10-CM | POA: Diagnosis not present

## 2019-04-09 DIAGNOSIS — D631 Anemia in chronic kidney disease: Secondary | ICD-10-CM | POA: Diagnosis not present

## 2019-04-09 DIAGNOSIS — N186 End stage renal disease: Secondary | ICD-10-CM | POA: Diagnosis not present

## 2019-04-09 DIAGNOSIS — N2581 Secondary hyperparathyroidism of renal origin: Secondary | ICD-10-CM | POA: Diagnosis not present

## 2019-04-12 DIAGNOSIS — Z992 Dependence on renal dialysis: Secondary | ICD-10-CM | POA: Diagnosis not present

## 2019-04-12 DIAGNOSIS — N2581 Secondary hyperparathyroidism of renal origin: Secondary | ICD-10-CM | POA: Diagnosis not present

## 2019-04-12 DIAGNOSIS — N186 End stage renal disease: Secondary | ICD-10-CM | POA: Diagnosis not present

## 2019-04-12 DIAGNOSIS — D631 Anemia in chronic kidney disease: Secondary | ICD-10-CM | POA: Diagnosis not present

## 2019-04-13 ENCOUNTER — Inpatient Hospital Stay (HOSPITAL_COMMUNITY)
Admission: EM | Admit: 2019-04-13 | Discharge: 2019-04-20 | DRG: 177 | Disposition: A | Discharge status: Home / Self Care | Payer: Medicare Other | Attending: Family Medicine | Admitting: Family Medicine

## 2019-04-13 ENCOUNTER — Encounter (HOSPITAL_COMMUNITY): Payer: Self-pay | Admitting: Emergency Medicine

## 2019-04-13 ENCOUNTER — Emergency Department (HOSPITAL_COMMUNITY): Payer: Medicare Other

## 2019-04-13 ENCOUNTER — Other Ambulatory Visit: Payer: Self-pay

## 2019-04-13 DIAGNOSIS — Z8719 Personal history of other diseases of the digestive system: Secondary | ICD-10-CM

## 2019-04-13 DIAGNOSIS — J9601 Acute respiratory failure with hypoxia: Secondary | ICD-10-CM | POA: Diagnosis present

## 2019-04-13 DIAGNOSIS — N186 End stage renal disease: Secondary | ICD-10-CM | POA: Diagnosis not present

## 2019-04-13 DIAGNOSIS — R042 Hemoptysis: Secondary | ICD-10-CM | POA: Diagnosis present

## 2019-04-13 DIAGNOSIS — R0902 Hypoxemia: Secondary | ICD-10-CM | POA: Diagnosis not present

## 2019-04-13 DIAGNOSIS — Z7984 Long term (current) use of oral hypoglycemic drugs: Secondary | ICD-10-CM

## 2019-04-13 DIAGNOSIS — I132 Hypertensive heart and chronic kidney disease with heart failure and with stage 5 chronic kidney disease, or end stage renal disease: Secondary | ICD-10-CM | POA: Diagnosis not present

## 2019-04-13 DIAGNOSIS — Z79899 Other long term (current) drug therapy: Secondary | ICD-10-CM

## 2019-04-13 DIAGNOSIS — G4733 Obstructive sleep apnea (adult) (pediatric): Secondary | ICD-10-CM | POA: Diagnosis present

## 2019-04-13 DIAGNOSIS — I2510 Atherosclerotic heart disease of native coronary artery without angina pectoris: Secondary | ICD-10-CM | POA: Diagnosis present

## 2019-04-13 DIAGNOSIS — Z8249 Family history of ischemic heart disease and other diseases of the circulatory system: Secondary | ICD-10-CM

## 2019-04-13 DIAGNOSIS — I444 Left anterior fascicular block: Secondary | ICD-10-CM | POA: Diagnosis present

## 2019-04-13 DIAGNOSIS — R9431 Abnormal electrocardiogram [ECG] [EKG]: Secondary | ICD-10-CM | POA: Clinically undetermined

## 2019-04-13 DIAGNOSIS — E1140 Type 2 diabetes mellitus with diabetic neuropathy, unspecified: Secondary | ICD-10-CM | POA: Diagnosis present

## 2019-04-13 DIAGNOSIS — E785 Hyperlipidemia, unspecified: Secondary | ICD-10-CM | POA: Diagnosis present

## 2019-04-13 DIAGNOSIS — E1129 Type 2 diabetes mellitus with other diabetic kidney complication: Secondary | ICD-10-CM | POA: Diagnosis present

## 2019-04-13 DIAGNOSIS — I5043 Acute on chronic combined systolic (congestive) and diastolic (congestive) heart failure: Secondary | ICD-10-CM | POA: Diagnosis not present

## 2019-04-13 DIAGNOSIS — M109 Gout, unspecified: Secondary | ICD-10-CM | POA: Diagnosis present

## 2019-04-13 DIAGNOSIS — I4581 Long QT syndrome: Secondary | ICD-10-CM | POA: Diagnosis present

## 2019-04-13 DIAGNOSIS — Z833 Family history of diabetes mellitus: Secondary | ICD-10-CM

## 2019-04-13 DIAGNOSIS — J80 Acute respiratory distress syndrome: Secondary | ICD-10-CM | POA: Diagnosis present

## 2019-04-13 DIAGNOSIS — I248 Other forms of acute ischemic heart disease: Secondary | ICD-10-CM | POA: Diagnosis present

## 2019-04-13 DIAGNOSIS — N189 Chronic kidney disease, unspecified: Secondary | ICD-10-CM

## 2019-04-13 DIAGNOSIS — Z20822 Contact with and (suspected) exposure to covid-19: Secondary | ICD-10-CM

## 2019-04-13 DIAGNOSIS — I214 Non-ST elevation (NSTEMI) myocardial infarction: Secondary | ICD-10-CM

## 2019-04-13 DIAGNOSIS — Z87891 Personal history of nicotine dependence: Secondary | ICD-10-CM

## 2019-04-13 DIAGNOSIS — Z7982 Long term (current) use of aspirin: Secondary | ICD-10-CM

## 2019-04-13 DIAGNOSIS — J22 Unspecified acute lower respiratory infection: Secondary | ICD-10-CM | POA: Diagnosis present

## 2019-04-13 DIAGNOSIS — E1159 Type 2 diabetes mellitus with other circulatory complications: Secondary | ICD-10-CM | POA: Diagnosis present

## 2019-04-13 DIAGNOSIS — I10 Essential (primary) hypertension: Secondary | ICD-10-CM | POA: Diagnosis present

## 2019-04-13 DIAGNOSIS — I152 Hypertension secondary to endocrine disorders: Secondary | ICD-10-CM | POA: Diagnosis present

## 2019-04-13 DIAGNOSIS — Z8674 Personal history of sudden cardiac arrest: Secondary | ICD-10-CM

## 2019-04-13 DIAGNOSIS — E875 Hyperkalemia: Secondary | ICD-10-CM | POA: Diagnosis not present

## 2019-04-13 DIAGNOSIS — I252 Old myocardial infarction: Secondary | ICD-10-CM

## 2019-04-13 DIAGNOSIS — D631 Anemia in chronic kidney disease: Secondary | ICD-10-CM | POA: Diagnosis present

## 2019-04-13 DIAGNOSIS — I120 Hypertensive chronic kidney disease with stage 5 chronic kidney disease or end stage renal disease: Secondary | ICD-10-CM | POA: Diagnosis not present

## 2019-04-13 DIAGNOSIS — J1289 Other viral pneumonia: Secondary | ICD-10-CM | POA: Diagnosis not present

## 2019-04-13 DIAGNOSIS — Z992 Dependence on renal dialysis: Secondary | ICD-10-CM

## 2019-04-13 DIAGNOSIS — J9690 Respiratory failure, unspecified, unspecified whether with hypoxia or hypercapnia: Secondary | ICD-10-CM

## 2019-04-13 DIAGNOSIS — U071 COVID-19: Secondary | ICD-10-CM | POA: Diagnosis present

## 2019-04-13 DIAGNOSIS — I272 Pulmonary hypertension, unspecified: Secondary | ICD-10-CM

## 2019-04-13 DIAGNOSIS — Z8379 Family history of other diseases of the digestive system: Secondary | ICD-10-CM

## 2019-04-13 DIAGNOSIS — I340 Nonrheumatic mitral (valve) insufficiency: Secondary | ICD-10-CM | POA: Diagnosis present

## 2019-04-13 DIAGNOSIS — E1122 Type 2 diabetes mellitus with diabetic chronic kidney disease: Secondary | ICD-10-CM | POA: Diagnosis present

## 2019-04-13 DIAGNOSIS — Z6835 Body mass index (BMI) 35.0-35.9, adult: Secondary | ICD-10-CM

## 2019-04-13 DIAGNOSIS — R0602 Shortness of breath: Secondary | ICD-10-CM | POA: Diagnosis not present

## 2019-04-13 LAB — CBC
HCT: 29.4 % — ABNORMAL LOW (ref 39.0–52.0)
Hemoglobin: 10.1 g/dL — ABNORMAL LOW (ref 13.0–17.0)
MCH: 28.2 pg (ref 26.0–34.0)
MCHC: 34.4 g/dL (ref 30.0–36.0)
MCV: 82.1 fL (ref 80.0–100.0)
Platelets: 199 10*3/uL (ref 150–400)
RBC: 3.58 MIL/uL — ABNORMAL LOW (ref 4.22–5.81)
RDW: 14.6 % (ref 11.5–15.5)
WBC: 4.2 10*3/uL (ref 4.0–10.5)
nRBC: 0 % (ref 0.0–0.2)

## 2019-04-13 LAB — COMPREHENSIVE METABOLIC PANEL
ALT: 65 U/L — ABNORMAL HIGH (ref 0–44)
AST: 46 U/L — ABNORMAL HIGH (ref 15–41)
Albumin: 3.4 g/dL — ABNORMAL LOW (ref 3.5–5.0)
Alkaline Phosphatase: 44 U/L (ref 38–126)
Anion gap: 19 — ABNORMAL HIGH (ref 5–15)
BUN: 48 mg/dL — ABNORMAL HIGH (ref 6–20)
CO2: 26 mmol/L (ref 22–32)
Calcium: 9 mg/dL (ref 8.9–10.3)
Chloride: 90 mmol/L — ABNORMAL LOW (ref 98–111)
Creatinine, Ser: 13.63 mg/dL — ABNORMAL HIGH (ref 0.61–1.24)
GFR calc Af Amer: 4 mL/min — ABNORMAL LOW (ref >60)
GFR calc non Af Amer: 4 mL/min — ABNORMAL LOW (ref >60)
Glucose, Bld: 95 mg/dL (ref 70–99)
Potassium: 4.1 mmol/L (ref 3.5–5.1)
Sodium: 135 mmol/L (ref 135–145)
Total Bilirubin: 1 mg/dL (ref 0.3–1.2)
Total Protein: 7.4 g/dL (ref 6.5–8.1)

## 2019-04-13 LAB — LIPASE, BLOOD: Lipase: 37 U/L (ref 11–51)

## 2019-04-13 MED ORDER — SODIUM CHLORIDE 0.9 % IV SOLN
2.0000 g | Freq: Once | INTRAVENOUS | Status: AC
  Administered 2019-04-14: 2 g via INTRAVENOUS
  Filled 2019-04-13: qty 20

## 2019-04-13 MED ORDER — DEXAMETHASONE SODIUM PHOSPHATE 10 MG/ML IJ SOLN
10.0000 mg | Freq: Once | INTRAMUSCULAR | Status: AC
  Administered 2019-04-14: 10 mg via INTRAVENOUS
  Filled 2019-04-13: qty 1

## 2019-04-13 MED ORDER — SODIUM CHLORIDE 0.9 % IV SOLN
500.0000 mg | Freq: Once | INTRAVENOUS | Status: AC
  Administered 2019-04-14: 500 mg via INTRAVENOUS
  Filled 2019-04-13: qty 500

## 2019-04-13 NOTE — ED Triage Notes (Signed)
Pt reports diarrhea for the past couple of days and chronic SOB while laying flat. Pt reports hx of dialysis and CHF. Pt in NAD.

## 2019-04-13 NOTE — ED Notes (Signed)
sats 88% on RA in waiting room, placed on [facility]

## 2019-04-13 NOTE — ED Provider Notes (Signed)
[facility] EMERGENCY DEPARTMENT Provider Note   CSN: [id number] Arrival date & time: 04/13/19  1010    History   Chief Complaint Chief Complaint  Patient presents with   Diarrhea   Shortness of Breath    laying flat    HPI Troy Wells is a 53 y.o. male.   The history is provided by the patient.  Diarrhea Shortness of Breath He has history of hypertension, diabetes, hyperlipidemia, end-stage renal disease on hemodialysis and comes in with shortness of breath for the last 3 days.  He denies fever but has had some chills and sweats.  There has been a cough which is productive of some blood-streaked sputum.  There have been generalized body aches.  He did have diarrhea 2 days ago but has resolved today.  There has been no nausea or vomiting.  He denies any change in his sense of smell or taste.  He has been exposed to COVID-19 and that his daughter tested +2 weeks ago.  Last dialysis was yesterday and he did not feel like his shortness of breath improved with dialysis.  Past Medical History:  Diagnosis Date   Allergy    Chronic kidney disease    Chronic Renal Insufficiency   Coronary artery disease 08/15/2009   with AMI   Diabetes mellitus without complication [facility])    Dialysis patient ([place]) 09-27-2014   mon, wed, and fri   Heart murmur    Hyperlipidemia    Hypertension    Myocardial infarction ([place])    10 yrs ago per pt   OSA treated with BiPAP 06/17/2010   Sleep apnea    cpap nightly   Wears glasses    Wears partial dentures     Patient Active Problem List   Diagnosis Date Noted   Gout 10/21/2018   Diabetic neuropathy ([place]) 04/02/2016   Colon polyps 01/02/2016   Diverticulosis of colon without hemorrhage 01/02/2016   Hypertensive heart disease 10/03/2015   Left atrial dilation 09/19/2015   Mitral valve regurgitation 09/19/2015   HLD (hyperlipidemia) 09/06/2015   Elevated total protein 09/06/2015   Heart murmur,  systolic 37/90/2409   Obstructive apnea 08/31/2015   End-stage renal disease on hemodialysis ([place]) 08/31/2015   Diabetes mellitus without complication ([place]) 73/53/2992   OSA treated with BiPAP 12/12/2013   Congestive heart failure ([place]) 12/12/2009   Essential hypertension 11/01/2009   MI 11/01/2009   Coronary atherosclerosis 11/01/2009   CARDIAC ARREST 11/01/2009    Past Surgical History:  Procedure Laterality Date   AV FISTULA PLACEMENT Left 09/27/2014   INSERTION OF ARTERIOVENOUS (AV) ARTEGRAFT ARM Left 04/02/2018   Procedure: INSERTION OF ARTERIOVENOUS (AV) ARTEGRAFT ARM LEFT UPPER ARM;  Surgeon: Waynetta Sandy, MD;  Location: [place];  Service: Vascular;  Laterality: Left;   INSERTION OF DIALYSIS CATHETER N/A 04/02/2018   Procedure: INSERTION OF DIALYSIS CATHETER, right internal jugular;  Surgeon: Waynetta Sandy, MD;  Location: [place];  Service: Vascular;  Laterality: N/A;   NECK SURGERY     C6 & C7 replaced 30 yrs ago per pt   REVISON OF ARTERIOVENOUS FISTULA Left 04/02/2018   Procedure: REVISION PLICATION OF ARTERIOVENOUS FISTULA ARM;  Surgeon: Waynetta Sandy, MD;  Location: [facility] OR;  Service: Vascular;  Laterality: Left;   WISDOM TOOTH EXTRACTION          Home Medications    Prior to Admission medications   Medication Sig Start Date End Date Taking? Authorizing Provider  acetaminophen (TYLENOL) 500 MG  tablet Take 500 mg by mouth every 6 (six) hours as needed for mild pain or moderate pain.    [provider]  allopurinol (ZYLOPRIM) 100 MG tablet Take 100 mg by mouth daily. 10/04/14   [provider]  amLODipine (NORVASC) 10 MG tablet Take 10 mg by mouth daily.    [provider]  aspirin 81 MG tablet Take 81 mg by mouth daily. Reported on 01/02/2016    [provider]  atorvastatin (LIPITOR) 40 MG tablet TAKE ONE TABLET BY MOUTH ONCE DAILY 03/12/16   [name], MD  carvedilol (COREG) 25 MG  tablet Take 1 tablet (25 mg total) by mouth 2 (two) times daily with a meal. 08/31/15   [name], MD  cetirizine (ZYRTEC) 5 MG tablet Take 1 tablet (5 mg total) by mouth daily. 10/21/18   [name], MD  colchicine 0.6 MG tablet Take 0.6 mg by mouth 2 (two) times daily. 12/11/10   [provider]  cyclobenzaprine (FLEXERIL) 10 MG tablet Take 1 tablet (10 mg total) by mouth 2 (two) times daily as needed for muscle spasms. Patient not taking: Reported on 10/21/2018 12/24/15   Ward, Ozella Almond, PA-C  FOSRENOL 1000 MG chewable tablet  12/06/15   [provider]  gabapentin (NEURONTIN) 300 MG capsule TAKE ONE CAPSULE BY MOUTH THREE TIMES DAILY Patient taking differently: Take 300 mg by mouth daily.  09/30/16   [name], MD  glipiZIDE (GLUCOTROL) 5 MG tablet Take 5 mg by mouth daily. 10/04/14   [provider]  hydrocortisone cream 0.5 % Apply 1 application topically 2 (two) times daily. Patient not taking: Reported on 10/21/2018 11/19/17   Glenis Smoker, MD  minoxidil (LONITEN) 10 MG tablet Take 2 tablets (20 mg total) by mouth 2 (two) times daily. 09/11/17   Zenia Resides, MD  Olopatadine HCl (PATADAY) 0.2 % SOLN One drop to each eye once daily prn allergy 10/21/18   [name], MD  oxyCODONE-acetaminophen (PERCOCET) 5-325 MG tablet Take 1 tablet by mouth every 6 (six) hours as needed for severe pain. Patient not taking: Reported on 04/24/2018 04/02/18   Gabriel Earing, PA-C  SENSIPAR 30 MG tablet  12/01/15   [provider]  torsemide (DEMADEX) 100 MG tablet Take 100 mg by mouth daily. Take on Tuesday, Thursday, Saturday, and Sunday    [provider]    Family History Family History  Problem Relation Age of Onset   Hypertension Mother    Heart disease Mother 93       cause of death   Hypertension Father    Aneurysm Father 15       brain aneurysm   Heart disease Father 74       cause of death   Hypertension Brother     Alcohol abuse Brother    Diabetes Brother    Pancreatitis Brother    Hypertension Brother    Colon cancer Neg Hx     Social History Social History   Tobacco Use   Smoking status: Former Smoker    Packs/day: 1.00    Years: 10.00    Pack years: 10.00    Types: Cigars    Quit date: 06/18/2011    Years since quitting: 7.8   Smokeless tobacco: Never Used  Substance Use Topics   Alcohol use: Yes    Alcohol/week: 1.0 standard drinks    Types: 1 Standard drinks or equivalent per week  Comment: beer or liquor occasionally   Drug use: No     Allergies   Patient has no known allergies.   Review of Systems Review of Systems  Respiratory: Positive for shortness of breath.   Gastrointestinal: Positive for diarrhea.  All other systems reviewed and are negative.    Physical Exam Updated Vital Signs BP 132/74    Pulse 87    Temp 99.4 F (37.4 C) (Oral)    Resp (!) 30    Ht 6' (1.829 m)    Wt 117.9 kg    SpO2 94%    BMI 35.26 kg/m   Physical Exam Vitals signs and nursing note reviewed.    53 year old male, resting comfortably and in no acute distress. Vital signs are significant for rapid respiratory rate. Oxygen saturation is 94%, which is normal but rales was measured while on supplemental oxygen.  Off of oxygen, oxygen saturation was 86% which is hypoxic. Head is normocephalic and atraumatic. PERRLA, EOMI. Oropharynx is clear. Neck is nontender and supple without adenopathy or JVD. Back is nontender and there is no CVA tenderness. Lungs have bibasilar rales one third of the way up.  There are no wheezes or rhonchi. Chest is nontender. Heart has regular rate and rhythm without murmur. Abdomen is soft, flat, nontender without masses or hepatosplenomegaly and peristalsis is normoactive. Extremities have trace edema, full range of motion is present.  AV graft is present in the left upper arm with thrill present. Skin is warm and dry without rash. Neurologic:  Mental status is normal, cranial nerves are intact, there are no motor or sensory deficits.  ED Treatments / Results  Labs (all labs ordered are listed, but only abnormal results are displayed) Labs Reviewed  COMPREHENSIVE METABOLIC PANEL - Abnormal; Notable for the following components:      Result Value   Chloride 90 (*)    BUN 48 (*)    Creatinine, Ser 13.63 (*)    Albumin 3.4 (*)    AST 46 (*)    ALT 65 (*)    GFR calc non Af Amer 4 (*)    GFR calc Af Amer 4 (*)    Anion gap 19 (*)    All other components within normal limits  CBC - Abnormal; Notable for the following components:   RBC 3.58 (*)    Hemoglobin 10.1 (*)    HCT 29.4 (*)    All other components within normal limits  SARS CORONAVIRUS 2 (TAT 6-24 HRS)  LIPASE, BLOOD  URINALYSIS, ROUTINE W REFLEX MICROSCOPIC    EKG EKG Interpretation  Date/Time:  Tuesday April 13 2019 22:39:31 EDT Ventricular Rate:  86 PR Interval:    QRS Duration: 107 QT Interval:  409 QTC Calculation: 490 R Axis:   -76 Text Interpretation: Sinus rhythm Probable left atrial enlargement Left anterior fascicular block Borderline prolonged QT interval No old tracing to compare Confirmed by Delora Fuel ([id number]) on 04/13/2019 10:59:54 PM   Radiology Dg Chest Portable 1 View  Result Date: 04/13/2019 CLINICAL DATA:  Shortness of breath, history of CHF and dialysis requirement. EXAM: PORTABLE CHEST 1 VIEW COMPARISON:  Radiograph 04/02/2018 FINDINGS: Widespread interstitial and airspace opacity throughout both lungs. Pulmonary vascularity is indistinct. The cardiac silhouette is lobular and enlarged though similar to comparison exams. No pneumothorax or visible effusion. No acute osseous or soft tissue abnormality. IMPRESSION: 1. Widespread interstitial and airspace opacity throughout both lungs, consistent with interstitial and alveolar pulmonary edema given history of CHF. Early  infection could have a similar appearance in the appropriate  clinical setting. 2. Unchanged enlargement of the cardiac silhouette, which could reflect cardiomegaly or pericardial effusion. Electronically Signed   By: Lovena Le M.D.   On: 04/13/2019 23:20    Procedures Procedures  CRITICAL CARE Performed by: Delora Fuel Total critical care time: 45 minutes Critical care time was exclusive of separately billable procedures and treating other patients. Critical care was necessary to treat or prevent imminent or life-threatening deterioration. Critical care was time spent personally by me on the following activities: development of treatment plan with patient and/or surrogate as well as nursing, discussions with consultants, evaluation of patient's response to treatment, examination of patient, obtaining history from patient or surrogate, ordering and performing treatments and interventions, ordering and review of laboratory studies, ordering and review of radiographic studies, pulse oximetry and re-evaluation of patient's condition.  Medications Ordered in ED Medications  dexamethasone (DECADRON) injection 10 mg (has no administration in time range)  azithromycin (ZITHROMAX) 500 mg in sodium chloride 0.9 % 250 mL IVPB (has no administration in time range)  cefTRIAXone (ROCEPHIN) 2 g in sodium chloride 0.9 % 100 mL IVPB (has no administration in time range)     Initial Impression / Assessment and Plan / ED Course  I have reviewed the triage vital signs and the nursing notes.  Pertinent labs & imaging results that were available during my care of the patient were reviewed by me and considered in my medical decision making (see chart for details).  Influenza-like illness certainly concerning for COVID-19 in the setting of the COVID-19 pandemic and with known exposure to COVID-19.  He does not seem to be significantly fluid overloaded.  Labs show end-stage renal disease, anemia which is stable.  Chest x-ray is being obtained.  Old records are reviewed  confirming end-stage renal disease.  Chest x-ray is read by radiologist is showing evidence of heart failure.  On my review, it seems more consistent with COVID-19 pneumonia.  Patient started to have oxygen desaturation in spite of nasal cannula oxygen.  He is clearly too unstable to go home.  He is started on antibiotics for community-acquired pneumonia and given a dose of dexamethasone.  Case is discussed with Dr. [facility] teaching service who agrees to admit the patient.  Troy Wells was evaluated in Emergency Department on 04/13/2019 for the symptoms described in the history of present illness. He was evaluated in the context of the global COVID-19 pandemic, which necessitated consideration that the patient might be at risk for infection with the SARS-CoV-2 virus that causes COVID-19. Institutional protocols and algorithms that pertain to the evaluation of patients at risk for COVID-19 are in a state of rapid change based on information released by regulatory bodies including the CDC and federal and state organizations. These policies and algorithms were followed during the patient's care in the ED.  Final Clinical Impressions(s) / ED Diagnoses   Final diagnoses:  Suspected COVID-19 virus infection  Hypoxia  End-stage renal disease on hemodialysis ([place])  Anemia associated with chronic renal failure    ED Discharge Orders    None       Delora Fuel, MD 71/06/26 0030

## 2019-04-14 ENCOUNTER — Encounter (HOSPITAL_COMMUNITY): Payer: Self-pay | Admitting: Family Medicine

## 2019-04-14 DIAGNOSIS — D631 Anemia in chronic kidney disease: Secondary | ICD-10-CM | POA: Diagnosis present

## 2019-04-14 DIAGNOSIS — R7989 Other specified abnormal findings of blood chemistry: Secondary | ICD-10-CM | POA: Diagnosis not present

## 2019-04-14 DIAGNOSIS — E1140 Type 2 diabetes mellitus with diabetic neuropathy, unspecified: Secondary | ICD-10-CM | POA: Diagnosis present

## 2019-04-14 DIAGNOSIS — J1289 Other viral pneumonia: Secondary | ICD-10-CM | POA: Diagnosis present

## 2019-04-14 DIAGNOSIS — I252 Old myocardial infarction: Secondary | ICD-10-CM | POA: Diagnosis not present

## 2019-04-14 DIAGNOSIS — I509 Heart failure, unspecified: Secondary | ICD-10-CM

## 2019-04-14 DIAGNOSIS — E875 Hyperkalemia: Secondary | ICD-10-CM | POA: Diagnosis not present

## 2019-04-14 DIAGNOSIS — I10 Essential (primary) hypertension: Secondary | ICD-10-CM | POA: Diagnosis not present

## 2019-04-14 DIAGNOSIS — Z8674 Personal history of sudden cardiac arrest: Secondary | ICD-10-CM | POA: Diagnosis not present

## 2019-04-14 DIAGNOSIS — I340 Nonrheumatic mitral (valve) insufficiency: Secondary | ICD-10-CM | POA: Diagnosis present

## 2019-04-14 DIAGNOSIS — E1159 Type 2 diabetes mellitus with other circulatory complications: Secondary | ICD-10-CM | POA: Diagnosis not present

## 2019-04-14 DIAGNOSIS — R042 Hemoptysis: Secondary | ICD-10-CM | POA: Diagnosis not present

## 2019-04-14 DIAGNOSIS — J22 Unspecified acute lower respiratory infection: Secondary | ICD-10-CM | POA: Diagnosis not present

## 2019-04-14 DIAGNOSIS — I444 Left anterior fascicular block: Secondary | ICD-10-CM | POA: Diagnosis present

## 2019-04-14 DIAGNOSIS — I5043 Acute on chronic combined systolic (congestive) and diastolic (congestive) heart failure: Secondary | ICD-10-CM | POA: Diagnosis not present

## 2019-04-14 DIAGNOSIS — Z992 Dependence on renal dialysis: Secondary | ICD-10-CM | POA: Diagnosis not present

## 2019-04-14 DIAGNOSIS — N189 Chronic kidney disease, unspecified: Secondary | ICD-10-CM | POA: Diagnosis not present

## 2019-04-14 DIAGNOSIS — J9601 Acute respiratory failure with hypoxia: Secondary | ICD-10-CM | POA: Diagnosis present

## 2019-04-14 DIAGNOSIS — I2510 Atherosclerotic heart disease of native coronary artery without angina pectoris: Secondary | ICD-10-CM | POA: Diagnosis present

## 2019-04-14 DIAGNOSIS — I248 Other forms of acute ischemic heart disease: Secondary | ICD-10-CM | POA: Diagnosis present

## 2019-04-14 DIAGNOSIS — I272 Pulmonary hypertension, unspecified: Secondary | ICD-10-CM

## 2019-04-14 DIAGNOSIS — N186 End stage renal disease: Secondary | ICD-10-CM | POA: Diagnosis present

## 2019-04-14 DIAGNOSIS — U071 COVID-19: Secondary | ICD-10-CM | POA: Diagnosis present

## 2019-04-14 DIAGNOSIS — I132 Hypertensive heart and chronic kidney disease with heart failure and with stage 5 chronic kidney disease, or end stage renal disease: Secondary | ICD-10-CM | POA: Diagnosis present

## 2019-04-14 DIAGNOSIS — G4733 Obstructive sleep apnea (adult) (pediatric): Secondary | ICD-10-CM | POA: Diagnosis present

## 2019-04-14 DIAGNOSIS — M109 Gout, unspecified: Secondary | ICD-10-CM | POA: Diagnosis present

## 2019-04-14 DIAGNOSIS — E1122 Type 2 diabetes mellitus with diabetic chronic kidney disease: Secondary | ICD-10-CM | POA: Diagnosis present

## 2019-04-14 DIAGNOSIS — I4581 Long QT syndrome: Secondary | ICD-10-CM | POA: Diagnosis present

## 2019-04-14 DIAGNOSIS — E785 Hyperlipidemia, unspecified: Secondary | ICD-10-CM | POA: Diagnosis present

## 2019-04-14 DIAGNOSIS — I120 Hypertensive chronic kidney disease with stage 5 chronic kidney disease or end stage renal disease: Secondary | ICD-10-CM | POA: Diagnosis not present

## 2019-04-14 DIAGNOSIS — J80 Acute respiratory distress syndrome: Secondary | ICD-10-CM | POA: Diagnosis present

## 2019-04-14 DIAGNOSIS — Z6835 Body mass index (BMI) 35.0-35.9, adult: Secondary | ICD-10-CM | POA: Diagnosis not present

## 2019-04-14 DIAGNOSIS — R0902 Hypoxemia: Secondary | ICD-10-CM | POA: Diagnosis not present

## 2019-04-14 DIAGNOSIS — I214 Non-ST elevation (NSTEMI) myocardial infarction: Secondary | ICD-10-CM | POA: Diagnosis not present

## 2019-04-14 HISTORY — DX: Pulmonary hypertension, unspecified: I27.20

## 2019-04-14 LAB — POCT I-STAT 7, (LYTES, BLD GAS, ICA,H+H)
Acid-Base Excess: 6 mmol/L — ABNORMAL HIGH (ref 0.0–2.0)
Bicarbonate: 29.2 mmol/L — ABNORMAL HIGH (ref 20.0–28.0)
Calcium, Ion: 1.05 mmol/L — ABNORMAL LOW (ref 1.15–1.40)
HCT: 30 % — ABNORMAL LOW (ref 39.0–52.0)
Hemoglobin: 10.2 g/dL — ABNORMAL LOW (ref 13.0–17.0)
O2 Saturation: 96 %
Patient temperature: 99.7
Potassium: 4.7 mmol/L (ref 3.5–5.1)
Sodium: 134 mmol/L — ABNORMAL LOW (ref 135–145)
TCO2: 30 mmol/L (ref 22–32)
pCO2 arterial: 39 mmHg (ref 32.0–48.0)
pH, Arterial: 7.484 — ABNORMAL HIGH (ref 7.350–7.450)
pO2, Arterial: 80 mmHg — ABNORMAL LOW (ref 83.0–108.0)

## 2019-04-14 LAB — CBC
HCT: 29 % — ABNORMAL LOW (ref 39.0–52.0)
Hemoglobin: 10.1 g/dL — ABNORMAL LOW (ref 13.0–17.0)
MCH: 28.1 pg (ref 26.0–34.0)
MCHC: 34.8 g/dL (ref 30.0–36.0)
MCV: 80.8 fL (ref 80.0–100.0)
Platelets: 216 10*3/uL (ref 150–400)
RBC: 3.59 MIL/uL — ABNORMAL LOW (ref 4.22–5.81)
RDW: 14.5 % (ref 11.5–15.5)
WBC: 8.1 10*3/uL (ref 4.0–10.5)
nRBC: 0 % (ref 0.0–0.2)

## 2019-04-14 LAB — COMPREHENSIVE METABOLIC PANEL
ALT: 62 U/L — ABNORMAL HIGH (ref 0–44)
AST: 42 U/L — ABNORMAL HIGH (ref 15–41)
Albumin: 3.3 g/dL — ABNORMAL LOW (ref 3.5–5.0)
Alkaline Phosphatase: 52 U/L (ref 38–126)
Anion gap: 20 — ABNORMAL HIGH (ref 5–15)
BUN: 64 mg/dL — ABNORMAL HIGH (ref 6–20)
CO2: 27 mmol/L (ref 22–32)
Calcium: 8.7 mg/dL — ABNORMAL LOW (ref 8.9–10.3)
Chloride: 91 mmol/L — ABNORMAL LOW (ref 98–111)
Creatinine, Ser: 15.66 mg/dL — ABNORMAL HIGH (ref 0.61–1.24)
GFR calc Af Amer: 4 mL/min — ABNORMAL LOW (ref >60)
GFR calc non Af Amer: 3 mL/min — ABNORMAL LOW (ref >60)
Glucose, Bld: 86 mg/dL (ref 70–99)
Potassium: 4.3 mmol/L (ref 3.5–5.1)
Sodium: 138 mmol/L (ref 135–145)
Total Bilirubin: 1.1 mg/dL (ref 0.3–1.2)
Total Protein: 7.5 g/dL (ref 6.5–8.1)

## 2019-04-14 LAB — LIPID PANEL
Cholesterol: 207 mg/dL — ABNORMAL HIGH (ref 0–200)
HDL: 20 mg/dL — ABNORMAL LOW (ref >40)
LDL Cholesterol: 144 mg/dL — ABNORMAL HIGH (ref 0–99)
Total CHOL/HDL Ratio: 10.4 RATIO
Triglycerides: 213 mg/dL — ABNORMAL HIGH (ref <150)
VLDL: 43 mg/dL — ABNORMAL HIGH (ref 0–40)

## 2019-04-14 LAB — CBG MONITORING, ED
Glucose-Capillary: 259 mg/dL — ABNORMAL HIGH (ref 70–99)
Glucose-Capillary: 229 mg/dL — ABNORMAL HIGH (ref 70–99)
Glucose-Capillary: 171 mg/dL — ABNORMAL HIGH (ref 70–99)

## 2019-04-14 LAB — CK: Total CK: 371 U/L (ref 49–397)

## 2019-04-14 LAB — PROTIME-INR
INR: 1.1 (ref 0.8–1.2)
Prothrombin Time: 14 seconds (ref 11.4–15.2)

## 2019-04-14 LAB — HEMOGLOBIN A1C
Hgb A1c MFr Bld: 5.8 % — ABNORMAL HIGH (ref 4.8–5.6)
Mean Plasma Glucose: 119.76 mg/dL

## 2019-04-14 LAB — C-REACTIVE PROTEIN: CRP: 25.9 mg/dL — ABNORMAL HIGH (ref <1.0)

## 2019-04-14 LAB — FIBRINOGEN: Fibrinogen: 575 mg/dL — ABNORMAL HIGH (ref 210–475)

## 2019-04-14 LAB — HIV ANTIBODY (ROUTINE TESTING W REFLEX): HIV Screen 4th Generation wRfx: NONREACTIVE

## 2019-04-14 LAB — TROPONIN I (HIGH SENSITIVITY)
Troponin I (High Sensitivity): 2115 ng/L (ref <18)
Troponin I (High Sensitivity): 1705 ng/L (ref <18)

## 2019-04-14 LAB — D-DIMER, QUANTITATIVE: D-Dimer, Quant: 1.67 ug/mL-FEU — ABNORMAL HIGH (ref 0.00–0.50)

## 2019-04-14 LAB — SARS CORONAVIRUS 2 (TAT 6-24 HRS): SARS Coronavirus 2: POSITIVE — AB

## 2019-04-14 LAB — LACTATE DEHYDROGENASE: LDH: 329 U/L — ABNORMAL HIGH (ref 98–192)

## 2019-04-14 LAB — BRAIN NATRIURETIC PEPTIDE: B Natriuretic Peptide: 1317.6 pg/mL — ABNORMAL HIGH (ref 0.0–100.0)

## 2019-04-14 MED ORDER — DEXAMETHASONE 4 MG PO TABS
6.0000 mg | ORAL_TABLET | Freq: Every day | ORAL | Status: DC
  Administered 2019-04-15 – 2019-04-20 (×6): 6 mg via ORAL
  Filled 2019-04-14 (×6): qty 2

## 2019-04-14 MED ORDER — ASPIRIN 81 MG PO CHEW
81.0000 mg | CHEWABLE_TABLET | Freq: Every day | ORAL | Status: DC
  Administered 2019-04-14 – 2019-04-20 (×7): 81 mg via ORAL
  Filled 2019-04-14 (×7): qty 1

## 2019-04-14 MED ORDER — SODIUM CHLORIDE 0.9 % IV SOLN
100.0000 mg | INTRAVENOUS | Status: AC
  Administered 2019-04-15 – 2019-04-18 (×4): 100 mg via INTRAVENOUS
  Filled 2019-04-14 (×4): qty 20

## 2019-04-14 MED ORDER — MINOXIDIL 10 MG PO TABS
20.0000 mg | ORAL_TABLET | Freq: Two times a day (BID) | ORAL | Status: DC
  Administered 2019-04-14 – 2019-04-20 (×14): 20 mg via ORAL
  Filled 2019-04-14 (×17): qty 2

## 2019-04-14 MED ORDER — ATORVASTATIN CALCIUM 40 MG PO TABS
40.0000 mg | ORAL_TABLET | Freq: Every day | ORAL | Status: DC
  Administered 2019-04-14 – 2019-04-15 (×2): 40 mg via ORAL
  Filled 2019-04-14 (×2): qty 1

## 2019-04-14 MED ORDER — GABAPENTIN 300 MG PO CAPS
300.0000 mg | ORAL_CAPSULE | Freq: Every day | ORAL | Status: DC
  Administered 2019-04-14 – 2019-04-19 (×6): 300 mg via ORAL
  Filled 2019-04-14 (×6): qty 1

## 2019-04-14 MED ORDER — SODIUM CHLORIDE 0.9 % IV SOLN
200.0000 mg | Freq: Once | INTRAVENOUS | Status: AC
  Administered 2019-04-14: 200 mg via INTRAVENOUS
  Filled 2019-04-14: qty 40

## 2019-04-14 MED ORDER — CARVEDILOL 25 MG PO TABS
25.0000 mg | ORAL_TABLET | Freq: Two times a day (BID) | ORAL | Status: DC
  Administered 2019-04-14 – 2019-04-20 (×13): 25 mg via ORAL
  Filled 2019-04-14 (×4): qty 1
  Filled 2019-04-14: qty 2
  Filled 2019-04-14 (×4): qty 1
  Filled 2019-04-14: qty 2
  Filled 2019-04-14 (×4): qty 1

## 2019-04-14 MED ORDER — HEPARIN SODIUM (PORCINE) 5000 UNIT/ML IJ SOLN
5000.0000 [IU] | Freq: Three times a day (TID) | INTRAMUSCULAR | Status: DC
  Administered 2019-04-14 – 2019-04-18 (×13): 5000 [IU] via SUBCUTANEOUS
  Filled 2019-04-14 (×13): qty 1

## 2019-04-14 MED ORDER — DEXAMETHASONE SODIUM PHOSPHATE 10 MG/ML IJ SOLN: 6.0000 mg | Freq: Every day | INTRAMUSCULAR | Status: DC

## 2019-04-14 MED ORDER — INSULIN ASPART 100 UNIT/ML ~~LOC~~ SOLN
0.0000 [IU] | Freq: Three times a day (TID) | SUBCUTANEOUS | Status: DC
  Administered 2019-04-14: 3 [IU] via SUBCUTANEOUS
  Administered 2019-04-14: 5 [IU] via SUBCUTANEOUS
  Administered 2019-04-15: 1 [IU] via SUBCUTANEOUS
  Administered 2019-04-15: 2 [IU] via SUBCUTANEOUS
  Administered 2019-04-16 (×2): 1 [IU] via SUBCUTANEOUS
  Administered 2019-04-17: 3 [IU] via SUBCUTANEOUS
  Administered 2019-04-17: 2 [IU] via SUBCUTANEOUS
  Administered 2019-04-17: 1 [IU] via SUBCUTANEOUS
  Administered 2019-04-18: 3 [IU] via SUBCUTANEOUS
  Administered 2019-04-18: 2 [IU] via SUBCUTANEOUS
  Administered 2019-04-18 – 2019-04-19 (×3): 1 [IU] via SUBCUTANEOUS
  Administered 2019-04-19 – 2019-04-20 (×2): 2 [IU] via SUBCUTANEOUS

## 2019-04-14 MED ORDER — CHLORHEXIDINE GLUCONATE CLOTH 2 % EX PADS
6.0000 | MEDICATED_PAD | Freq: Every day | CUTANEOUS | Status: DC
  Administered 2019-04-15 – 2019-04-17 (×3): 6 via TOPICAL

## 2019-04-14 MED ORDER — GABAPENTIN 300 MG PO CAPS: 300.0000 mg | ORAL_CAPSULE | Freq: Every day | ORAL | Status: DC

## 2019-04-14 MED ORDER — LINAGLIPTIN 5 MG PO TABS
5.0000 mg | ORAL_TABLET | Freq: Every day | ORAL | Status: DC
  Administered 2019-04-14 – 2019-04-20 (×7): 5 mg via ORAL
  Filled 2019-04-14 (×8): qty 1

## 2019-04-14 MED ORDER — ACETAMINOPHEN 500 MG PO TABS
500.0000 mg | ORAL_TABLET | Freq: Four times a day (QID) | ORAL | Status: DC | PRN
  Administered 2019-04-14: 500 mg via ORAL
  Filled 2019-04-14: qty 1

## 2019-04-14 MED ORDER — DEXAMETHASONE SODIUM PHOSPHATE 10 MG/ML IJ SOLN: 10.0000 mg | Freq: Every day | INTRAMUSCULAR | Status: DC

## 2019-04-14 NOTE — ED Notes (Signed)
Breakfast @ bedside.

## 2019-04-14 NOTE — Consult Note (Signed)
NAME:  Troy Wells, MRN:  [id number], DOB:  June 10, 1966, LOS: 0 ADMISSION DATE:  04/13/2019, CONSULTATION DATE:  04/14/2019 REFERRING MD:  Rosita Fire CHIEF COMPLAINT:  COVID 19 pneumonia, SOB  Brief History   53yo M PMH ESRD on HD, Diastolic CHF, DM2, OSA on BiPAP, MI, admitted for acute respiratory failure 2/2 COVID 19 pneumonia. PCCM consulted for recommendations regarding Remdesivir therapy.   History of present illness   53yo M PMH ESRD on HD, Diastolic CHF, DM2, HTN, OSA on BiPAP, MI, anemia of chronic kidney disease presented to ED 10/27 c/oo SOB x 3 days, associated with productive cough of blood streaked sputum, generalized body aches, chills and sweats, no fever, diarrhea 2 days ago which is since resolved, no associated nausea or vomiting, no loss of sense of smell or taste.  He has a known exposure to COVID-19 as his daughter tested +2 weeks ago.  Last dialysis was yesterday and shortness of breath did not improve with this therefore prompting visit to the emergency department.  In the ED he had a low-grade temperature of 99.7 F, heart rate in the 80s to 90s, stable blood pressure, increasing tachypnea, SPO2 86% on room air, placed on 2 L/min, subsequently dropped throughout his stay in the emergency department requiring nonrebreather then high flow nasal cannula.  Chest x-ray showed widespread interstitial and airspace opacity throughout both lungs consistent with edema. COVID-19 positive. Elevated troponin, subsequently downtrending.  No chest pain or EKG changes. He was admitted to family medicine teaching services, continued on dexamethasone, supplemental oxygen, azithromycin which was subsequently discontinued.  CCM was consulted for recommendations regarding initiation of remdesivir.  Past Medical History  End-stage renal disease on HD OSA on BiPAP Hypertension Diabetes mellitus Diastolic congestive heart failure Echo 09/18/2015 EF 60 to 65% with mildly increased PA peak  pressure 38 mmHg.  Negative dobutamine stress echo at [facility] on 09/04/2017 MI Anemia of chronic kidney disease Significant Hospital Events   10/28 admitted  Consults:  Nephrology CCM  Procedures:  NA  Significant Diagnostic Tests:  10/28 CXR bilateral widespread interstitial edema and peripheral opacities  Micro Data:  SARS Coronavirus 2 POSITIVE    Antimicrobials:  Azithromycin 10/27 x 1 dose Ceftriaxone 10/27x1 dose Remdesivir 10/28>>  Interim history/subjective:  Patient awaiting bed in emergency department. Remains on 10 L/min high flow nasal cannula, no complaints.  Objective   Blood pressure 116/69, pulse 81, temperature 98.7 F (37.1 C), temperature source Oral, resp. rate (!) 22, height 6' (1.829 m), weight 117.9 kg, SpO2 97 %.    FiO2 (%):  [90 %] 90 %   Intake/Output Summary (Last 24 hours) at 04/14/2019 1053 Last data filed at 04/14/2019 0351 Gross per 24 hour  Intake 350 ml  Output -  Net 350 ml   Filed Weights   04/13/19 1027  Weight: 117.9 kg    Examination: See separate note by Dr Lynetta Mare for physical exam.   [facility] Problem list   NA  Assessment & Plan:  This is a 53 year old male with history as outlined above admitted for acute hypoxic respiratory failure secondary to COVID-19 associated pneumonia.  Agree with current treatment plan. Remdesivir has been ordered for 4 doses. Of note patient does wear BiPAP for history of OSA.  Will defer at this time due to aerosolization and COVID-19.  Remaining plan per primary team.  We will follow with you.  Best practice:  Diet: Per primary Pain/Anxiety/Delirium protocol (if indicated): As needed VAP protocol (if indicated):  Not indicated DVT prophylaxis: Heparin 5000 units subcu every 8 hours per primary GI prophylaxis: Not indicated Glucose control: Per primary Mobility: Per primary Code Status: Full code Family Communication: Patient able to update Disposition:  Progressive care per primary services  Labs   CBC: Recent Labs  Lab 04/13/19 1116 04/14/19 0200 04/14/19 0419  WBC 4.2 8.1  --   HGB 10.1* 10.1* 10.2*  HCT 29.4* 29.0* 30.0*  MCV 82.1 80.8  --   PLT 199 216  --     Basic Metabolic Panel: Recent Labs  Lab 04/13/19 1116 04/14/19 0200 04/14/19 0419  NA 135 138 134*  K 4.1 4.3 4.7  CL 90* 91*  --   CO2 26 27  --   GLUCOSE 95 86  --   BUN 48* 64*  --   CREATININE 13.63* 15.66*  --   CALCIUM 9.0 8.7*  --    GFR: Estimated Creatinine Clearance: 7.1 mL/min (A) (by C-G formula based on SCr of 15.66 mg/dL (H)). Recent Labs  Lab 04/13/19 1116 04/14/19 0200  WBC 4.2 8.1    Liver Function Tests: Recent Labs  Lab 04/13/19 1116 04/14/19 0200  AST 46* 42*  ALT 65* 62*  ALKPHOS 44 52  BILITOT 1.0 1.1  PROT 7.4 7.5  ALBUMIN 3.4* 3.3*   Recent Labs  Lab 04/13/19 1116  LIPASE 37   No results for input(s): AMMONIA in the last 168 hours.  ABG    Component Value Date/Time   PHART 7.484 (H) 04/14/2019 0419   PCO2ART 39.0 04/14/2019 0419   PO2ART 80.0 (L) 04/14/2019 0419   HCO3 29.2 (H) 04/14/2019 0419   TCO2 30 04/14/2019 0419   O2SAT 96.0 04/14/2019 0419     Coagulation Profile: Recent Labs  Lab 04/14/19 0200  INR 1.1    Cardiac Enzymes: Recent Labs  Lab 04/14/19 0200  CKTOTAL 371    HbA1C: Hemoglobin-A1c  Date/Time Value Ref Range Status  09/14/2009 09:43 AM 8.5 (H) 5.4 - 7.4 % Final   Hemoglobin A1C  Date/Time Value Ref Range Status  11/21/2016 02:33 PM 5.3  Final  04/02/2016 10:05 AM 6.1  Final   HbA1c, POC (controlled diabetic range)  Date/Time Value Ref Range Status  10/21/2018 11:25 AM 5.7 0.0 - 7.0 % Final   Hgb A1c MFr Bld  Date/Time Value Ref Range Status  04/14/2019 02:00 AM 5.8 (H) 4.8 - 5.6 % Final    Comment:    (NOTE) Pre diabetes:          5.7%-6.4% Diabetes:              >6.4% Glycemic control for   <7.0% adults with diabetes     CBG: No results for input(s):  GLUCAP in the last 168 hours.  Review of Systems: Positives in bold  Gen: Denies fever, chills, weight change, fatigue, night sweats HEENT: Denies blurred vision, double vision, hearing loss, tinnitus, sinus congestion, rhinorrhea, sore throat, neck stiffness, dysphagia PULM: Denies shortness of breath, cough, sputum production, hemoptysis, wheezing CV: Denies chest pain, edema, orthopnea, paroxysmal nocturnal dyspnea, palpitations GI: Denies abdominal pain, nausea, vomiting, diarrhea, hematochezia, melena, constipation, change in bowel habits GU: Denies dysuria, hematuria, polyuria, oliguria, urethral discharge Endocrine: Denies hot or cold intolerance, polyuria, polyphagia or appetite change Derm: Denies rash, dry skin, scaling or peeling skin change Heme: Denies easy bruising, bleeding, bleeding gums Neuro: Denies headache, numbness, weakness, slurred speech, loss of memory or consciousness   Past Medical History  He,  has  a past medical history of Allergy, Chronic kidney disease, Coronary artery disease (08/15/2009), Diabetes mellitus without complication [facility]), Dialysis patient ([facility]) (09-27-2014), Heart murmur, Hyperlipidemia, Hypertension, Myocardial infarction ([place]), OSA treated with BiPAP (06/17/2010), Sleep apnea, Wears glasses, and Wears partial dentures.   Surgical History    Past Surgical History:  Procedure Laterality Date  . AV FISTULA PLACEMENT Left 09/27/2014  . INSERTION OF ARTERIOVENOUS (AV) ARTEGRAFT ARM Left 04/02/2018   Procedure: INSERTION OF ARTERIOVENOUS (AV) ARTEGRAFT ARM LEFT UPPER ARM;  Surgeon: Waynetta Sandy, MD;  Location: [place];  Service: Vascular;  Laterality: Left;  . INSERTION OF DIALYSIS CATHETER N/A 04/02/2018   Procedure: INSERTION OF DIALYSIS CATHETER, right internal jugular;  Surgeon: Waynetta Sandy, MD;  Location: [place];  Service: Vascular;  Laterality: N/A;  . NECK SURGERY     C6 & C7 replaced 30 yrs ago per pt  . REVISON OF  ARTERIOVENOUS FISTULA Left 04/02/2018   Procedure: REVISION PLICATION OF ARTERIOVENOUS FISTULA ARM;  Surgeon: Waynetta Sandy, MD;  Location: [place];  Service: Vascular;  Laterality: Left;  . WISDOM TOOTH EXTRACTION       Social History   reports that he quit smoking about 7 years ago. His smoking use included cigars. He has a 10.00 pack-year smoking history. He has never used smokeless tobacco. He reports current alcohol use of about 1.0 standard drinks of alcohol per week. He reports that he does not use drugs.   Family History   His family history includes Alcohol abuse in his brother; Aneurysm (age of onset: 4) in his father; Diabetes in his brother; Heart disease (age of onset: 60) in his mother; Heart disease (age of onset: 41) in his father; Hypertension in his brother, brother, father, and mother; Pancreatitis in his brother. There is no history of Colon cancer.   Allergies No Known Allergies   Home Medications  Prior to Admission medications   Medication Sig Start Date End Date Taking? Authorizing Provider  allopurinol (ZYLOPRIM) 100 MG tablet Take 100 mg by mouth 2 (two) times daily.  10/04/14  Yes [provider]  amLODipine (NORVASC) 10 MG tablet Take 10 mg by mouth daily.   Yes [provider]  aspirin 81 MG tablet Take 81 mg by mouth daily. Reported on 01/02/2016   Yes [provider]  atorvastatin (LIPITOR) 40 MG tablet TAKE ONE TABLET BY MOUTH ONCE DAILY 03/12/16  Yes [name], MD  AURYXIA 1 GM 210 MG(Fe) tablet Take 420-840 mg by mouth See admin instructions. 840 mg with meals, and 420 mg with snacks 01/11/19  Yes [provider]  carvedilol (COREG) 25 MG tablet Take 1 tablet (25 mg total) by mouth 2 (two) times daily with a meal. 08/31/15  Yes [name], MD  cetirizine (ZYRTEC) 5 MG tablet Take 1 tablet (5 mg total) by mouth daily. Patient taking differently: Take 5 mg by mouth daily as needed for allergies.  10/21/18  Yes  [name], MD  colchicine 0.6 MG tablet Take 0.6 mg by mouth 2 (two) times daily as needed (gout).  12/11/10  Yes [provider]  gabapentin (NEURONTIN) 300 MG capsule TAKE ONE CAPSULE BY MOUTH THREE TIMES DAILY Patient taking differently: Take 300 mg by mouth at bedtime.  09/30/16  Yes [name], MD  glipiZIDE (GLUCOTROL) 5 MG tablet Take 5 mg by mouth daily. 10/04/14  Yes [provider]  minoxidil (LONITEN) 10 MG tablet Take 2 tablets (20 mg  total) by mouth 2 (two) times daily. 09/11/17  Yes Hensel, Jamal Collin, MD  multivitamin (RENA-VIT) TABS tablet Take 1 tablet by mouth at bedtime.   Yes [provider]  PRESCRIPTION MEDICATION Pt has CPAP machine   Yes [provider]  torsemide (DEMADEX) 100 MG tablet Take 100 mg by mouth See admin instructions. Take on Tuesday, Thursday, Saturday, and Sunday    Yes [provider]     Francine Graven, MSN, [facility]

## 2019-04-14 NOTE — H&P (Addendum)
[facility] Admission History and Physical Service Pager: [phone]  Patient name: Troy Wells Medical record number: [id number] Date of birth: 02-04-1966 Age: 53 y.o. Gender: male  Primary Care Provider: [name], MD Consultants: None Code Status: Full Code  Preferred Emergency Contact: Star Age, daughter   Chief Complaint: SOB, diarrhea and chills   Assessment and Plan: Troy Wells is a 53 y.o. male presenting with SOB, chills and diarrhea for 3 days, patient also noted to have recent COVID-19 exposure. PMH is significant for ESRD MWF HD, OSA, HTN, HLD, CHFpEF, pHTN, DM, diabetic neuropathy.     Acute Hypoxic Respiratory Failure likely 2/2 COVID PNA vs volume overload 2/2 CHF Exacerbation  Hx of pulmonary HTN  Patient presenting with 2 day hx of SOB, chills and sweats, hemoptysis and diarrhea with recent COVID exposure and bilateral CXR opacities concerning for COVID PNA. Patient has new oxygen requirement up to 10 liters via [place] with persistent increased WOB.  Patient also with increased WOB and diffuse bilateral crackles.  Patient status post dexamethasone 10mg , azithromycin in the ED.  Likely that patient is experiencing hypoxia and respiratory symptoms due to COVID-19 PNA, testing pending. Other potential etiologies include influenza infection vs volume overload due to CHF exacerbation although less likely given patient does not appear volume overloaded on physical exam outside of pulmonary findings. -admit to progressive unit, attending Dr. Gwendlyn Deutscher -case has been discussed with CCM via [place] (due to O2 need) -Cardiac monitoring -Continuous pulse ox -continue Dexamethasone 6 mg (10/27) and azithromycin, discontinue CTX -Continue supplemental oxygen as needed, goal to maintain saturations above 92% -f/u covid swab  -A.m. CBC, CK, CMP, D-dimer, fibrinogen -HIV, lactate dehydrogenase, PT, INR, U/A -Vitals per unit protocol  ESRD  on HD MWF Last HD session was on 10/26. Patient reports that he was unable to complete the entire HD session. -Consult nephrology for HD per outpatient schedule  CHFpEF 60-65% as of 2017  CAD Low suspicion that this is pulmonary edema secondary to volume overload due to heart failure exacerbation.  On exam, patient does not have lower extremity edema.  Patient does have diffuse and bilateral pulmonary crackles but this is likely due to pneumonia from infectious etiology. Home medications include Coreg 25 mg daily,  aspirin 81 mg -repeat echocardiogram  -Continue Coreg & aspirin -Repeat troponin -BNP -Repeat lipid panel   Hypertension Patient with mostly normotensive blood pressures upon admission.  Patient has some elevated systolic pressures of 546 systolic.  Patient home medications include minoxidil 20 mg BID, amlodipine 10 mg, Coreg 25 mg BID -continue Coreg and minoxidil  OSA with use of CPAP  Patient reports using nightly CPAP but will not be able to use CPAP during this admission (unless confirmed covid neg) as it is against protocol for presumed COVID + patients.   HLD Home medications include atorvastatin 40mg  -Continue atorvastatin  Diabetes Patient with history of well-controlled diabetes.  Home medications include glipizide 5 mg twice daily.  Last hemoglobin A1c 5.7 and May 2020. -Repeat hemoglobin A1c  Diabetic Neuropathy  Patient on home gabapentin 300 mg TID. Patient reports only taking twice daily. Reporting no symptoms at this time.  -continue gabapentin 300mg  TID   Gout  Home medications include allopurinol 100mg  BID and colchicine 0.6mg  BID as needed when symptomatic. Patient reports no symptoms at this time.   FEN/GI: heart healthy  Prophylaxis: SCDs, patient reported hemoptysis    Disposition: admit to progressive unit   History of Present Illness:  Troy Wells is a 53 y.o. male presenting with worsening SOB, hemoptysis, diarrhea and body aches with  recent exposure to COVID positive family member.  Mr. Carmen states that his symptoms began 04/10/19  have been worsening. Patient reports his symptoms include SOB, frequent coughing with ~1 tablespoon of bright red hemoptysis, generalized body aches, cold and hot flashes without known fever and diarrhea. The patient reports that his daughter tested positive for COVID-19 2 weeks prior to his development of symptoms and he reports last being around her 2 weeks ago. The patient had a dialysis session yesterday was unable to complete the session. He reports that he did not have resolution of his SOB following HD. 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 Mr. Froelich does not use supplemental oxygen at home but has been requiring oxygen in the ED.   ED Course:  Patient was started on Azithromycin, dexamethasone and CTX. Patient was placed on supplemental oxygen and progressed to needed 10 liters via [place] with saturations in the high 80- low 90s. Patient is not on supplemental oxygen at home.  Labs of note include elevated AST 46 and ALT 65, normal electrolytes and WBC. Cr elevated as expected in ESRD on HD.   Review Of Systems: Per HPI with the following additions:   Review of Systems  Constitutional: Positive for chills. Negative for fever and weight loss.  HENT: Positive for nosebleeds.   Respiratory: Positive for cough, hemoptysis, sputum production and shortness of breath.   Cardiovascular: Positive for orthopnea. Negative for chest pain.  Gastrointestinal: Positive for diarrhea. Negative for nausea and vomiting.  Musculoskeletal: Positive for myalgias.  Neurological: Positive for headaches.    Patient Active Problem List   Diagnosis Date Noted  . Hypoxia 04/14/2019  . Suspected COVID-19 virus infection 04/14/2019  . Cough with hemoptysis 04/14/2019   . Pulmonary hypertension ([place]) 04/14/2019  . Gout 10/21/2018  . Diabetic neuropathy ([place]) 04/02/2016  . Colon polyps 01/02/2016  . Diverticulosis of colon without hemorrhage 01/02/2016  . Hypertensive heart disease 10/03/2015  . Left atrial dilation 09/19/2015  . Mitral valve regurgitation 09/19/2015  . HLD (hyperlipidemia) 09/06/2015  . Elevated total protein 09/06/2015  . Heart murmur, systolic 34/19/6222  . Obstructive apnea 08/31/2015  . End-stage renal disease on hemodialysis ([place]) 08/31/2015  . Diabetes mellitus without complication ([place]) 97/98/9211  . OSA treated with BiPAP 12/12/2013  . Congestive heart failure ([place]) 12/12/2009  . Essential hypertension 11/01/2009  . MI 11/01/2009  . Coronary atherosclerosis 11/01/2009  . CARDIAC ARREST 11/01/2009    Past Medical History: Past Medical History:  Diagnosis Date  . Allergy   . Chronic kidney disease    Chronic Renal Insufficiency  . Coronary artery disease 08/15/2009   with AMI  . Diabetes mellitus without complication ([place])   . Dialysis patient [facility]) 09-27-2014   mon, wed, and fri  . Heart murmur   . Hyperlipidemia   . Hypertension   . Myocardial infarction ([place])    10 yrs ago per pt  . OSA treated with BiPAP 06/17/2010  . Sleep apnea    cpap nightly  . Wears glasses   . Wears partial dentures     Past Surgical History: Past Surgical History:  Procedure Laterality Date  . AV FISTULA PLACEMENT Left 09/27/2014  . INSERTION OF ARTERIOVENOUS (AV) ARTEGRAFT ARM Left 04/02/2018  Procedure: INSERTION OF ARTERIOVENOUS (AV) ARTEGRAFT ARM LEFT UPPER ARM;  Surgeon: Waynetta Sandy, MD;  Location: [place];  Service: Vascular;  Laterality: Left;  . INSERTION OF DIALYSIS CATHETER N/A 04/02/2018   Procedure: INSERTION OF DIALYSIS CATHETER, right internal jugular;  Surgeon: Waynetta Sandy, MD;  Location: [place];  Service: Vascular;  Laterality: N/A;  . NECK SURGERY     C6 & C7 replaced 30 yrs ago per pt  .  REVISON OF ARTERIOVENOUS FISTULA Left 04/02/2018   Procedure: REVISION PLICATION OF ARTERIOVENOUS FISTULA ARM;  Surgeon: Waynetta Sandy, MD;  Location: [place];  Service: Vascular;  Laterality: Left;  . WISDOM TOOTH EXTRACTION      Social History: Social History   Tobacco Use  . Smoking status: Former Smoker    Packs/day: 1.00    Years: 10.00    Pack years: 10.00    Types: Cigars    Quit date: 06/18/2011    Years since quitting: 7.8  . Smokeless tobacco: Never Used  Substance Use Topics  . Alcohol use: Yes    Alcohol/week: 1.0 standard drinks    Types: 1 Standard drinks or equivalent per week    Comment: beer or liquor occasionally  . Drug use: No    Family History: Family History  Problem Relation Age of Onset  . Hypertension Mother   . Heart disease Mother 41       cause of death  . Hypertension Father   . Aneurysm Father 53       brain aneurysm  . Heart disease Father 63       cause of death  . Hypertension Brother   . Alcohol abuse Brother   . Diabetes Brother   . Pancreatitis Brother   . Hypertension Brother   . Colon cancer Neg Hx     Allergies and Medications: No Known Allergies No current facility-administered medications on file prior to encounter.    Current Outpatient Medications on File Prior to Encounter  Medication Sig Dispense Refill  . allopurinol (ZYLOPRIM) 100 MG tablet Take 100 mg by mouth 2 (two) times daily.     Marland Kitchen amLODipine (NORVASC) 10 MG tablet Take 10 mg by mouth daily.    Marland Kitchen aspirin 81 MG tablet Take 81 mg by mouth daily. Reported on 01/02/2016    . atorvastatin (LIPITOR) 40 MG tablet TAKE ONE TABLET BY MOUTH ONCE DAILY 90 tablet 1  . AURYXIA 1 GM 210 MG(Fe) tablet Take 420-840 mg by mouth See admin instructions. 840 mg with meals, and 420 mg with snacks    . carvedilol (COREG) 25 MG tablet Take 1 tablet (25 mg total) by mouth 2 (two) times daily with a meal. 60 tablet 2  . cetirizine (ZYRTEC) 5 MG tablet Take 1 tablet (5 mg  total) by mouth daily. (Patient taking differently: Take 5 mg by mouth daily as needed for allergies. ) 90 tablet 1  . colchicine 0.6 MG tablet Take 0.6 mg by mouth 2 (two) times daily as needed (gout).     Marland Kitchen gabapentin (NEURONTIN) 300 MG capsule TAKE ONE CAPSULE BY MOUTH THREE TIMES DAILY (Patient taking differently: Take 300 mg by mouth at bedtime. ) 90 capsule 2  . glipiZIDE (GLUCOTROL) 5 MG tablet Take 5 mg by mouth daily.    . minoxidil (LONITEN) 10 MG tablet Take 2 tablets (20 mg total) by mouth 2 (two) times daily. 360 tablet 3  . multivitamin (RENA-VIT) TABS tablet Take 1 tablet by mouth  at bedtime.    Marland Kitchen PRESCRIPTION MEDICATION Pt has CPAP machine    . torsemide (DEMADEX) 100 MG tablet Take 100 mg by mouth See admin instructions. Take on Tuesday, Thursday, Saturday, and Sunday       Objective: BP 133/79   Pulse 96   Temp 99.4 F (37.4 C) (Oral)   Resp (!) 29   Ht 6' (1.829 m)   Wt 117.9 kg   SpO2 90%   BMI 35.26 kg/m   Exam: *completed by Dr. Criss Rosales General: obese male in respiratory distress with increased WOB  Eyes: clear sclera, no conjunctival inovlement ENTM: speaking clearly, atraumatic Cardiovascular: regular rate rhythm, 2/6 murmur Respiratory: diffuse bilateral crackles, patient with increased WOB, oxygen sats 88% on 10 liters [place]  Gastrointestinal: protuberant belly, soft, no rebound tenderness MSK: healed fistula wounding on left arm, no other deficits noted Neuro: grossly intact, no focal abnormalities identified Psych: appropriately answering questions, knows medical history and can process information  Labs and Imaging: CBC BMET  Recent Labs  Lab 04/13/19 1116  WBC 4.2  HGB 10.1*  HCT 29.4*  PLT 199   Recent Labs  Lab 04/13/19 1116  NA 135  K 4.1  CL 90*  CO2 26  BUN 48*  CREATININE 13.63*  GLUCOSE 95  CALCIUM 9.0     EKG: SR with left atrial enlargement   Stark Klein, MD 04/14/2019, 2:28 AM PGY-1, [place]  Intern pager: [phone], text pages welcome  FPTS Upper-Level Resident Addendum   I have independently interviewed and examined the patient. I have discussed the above with the original author and agree with their documentation. My edits for correction/addition/clarification are in blue. Please see also any attending notes.    Sherene Sires, DO PGY-3, [facility] 04/14/2019 5:36 AM  FPTS Service pager: [phone] (text pages welcome through [facility])

## 2019-04-14 NOTE — ED Notes (Signed)
Pt's sats were dropping into mid-lower 80's so this RN went in and increased O2 to 4L/min via [place]. O2 sats at 97%. Will continue to monitor

## 2019-04-14 NOTE — Progress Notes (Signed)
Full H&P pending.  We are admitting this patient to progressive status with hypoxia due to presumed Covid pneumonia.  Covid test still pending.  Full work-up of Covid associated labs also pending.  Patient who is at room air at baseline now on 10 L nasal cannula with increased work of breathing and tachypnea.  Does have complaints of hemoptysis which I have not objectively seen yet.  Comorbidities include pulmonary hypertension, CHF, ESRD.  CCMS been notified for awareness in case this patient deteriorates and requires physical intervention by them.  Dr. Criss Rosales

## 2019-04-14 NOTE — ED Notes (Signed)
ED TO INPATIENT HANDOFF REPORT  ED Nurse Name and Phone #: Jinny Blossom #[phone]  S Name/Age/Gender Troy Wells 53 y.o. male Room/Bed: 027C/027C  Code Status   Code Status: Full Code  Home/SNF/Other Home Patient oriented to: self, place, time and situation Is this baseline? Yes   Triage Complete: Triage complete  Chief Complaint sob  diarrhea  Triage Note Pt reports diarrhea for the past couple of days and chronic SOB while laying flat. Pt reports hx of dialysis and CHF. Pt in NAD.    Allergies No Known Allergies  Level of Care/Admitting Diagnosis ED Disposition    ED Disposition Condition [facility] Area: [place] [[id number]]  Level of Care: Progressive [102]  Admit to Progressive based on following criteria: RESPIRATORY PROBLEMS hypoxemic/hypercapnic respiratory failure that is responsive to NIPPV (BiPAP) or High Flow Nasal Cannula (6-80 lpm). Frequent assessment/intervention, no > Q2 hrs < Q4 hrs, to maintain oxygenation and pulmonary hygiene.  Covid Evaluation: Person Under Investigation (PUI)  Diagnosis: Hypoxia [[id number]]  Admitting Physician: Sherene Sires [[id number]]  Attending Physician: Andrena Mews T [[id number]]  Estimated length of stay: past midnight tomorrow  Certification:: I certify this patient will need inpatient services for at least 2 midnights  PT Class (Do Not Modify): Inpatient [101]  PT Acc Code (Do Not Modify): Private [1]       B Medical/Surgery History Past Medical History:  Diagnosis Date  . Allergy   . CARDIAC ARREST 11/01/2009   Qualifier: Diagnosis of  By: Selena Batten CMA, Jewel    . Chronic kidney disease    Chronic Renal Insufficiency  . Colon polyps 01/02/2016   Colonoscopy July 2017 - One 3 mm polyp in the transverse colon, removed with a cold biopsy forceps. Resected and retrieved. - One 3 mm polyp in the rectum, removed with a cold biopsy forceps. Resected and retrieved. - Diverticulosis in the entire examined  colon. - Non-bleeding internal hemorrhoids. - The examination was otherwise normal. - Significant looping which prolonged cecal    . Coronary artery disease 08/15/2009   with AMI  . Diabetes mellitus without complication ([place])   . Dialysis patient [facility]) 09-27-2014   mon, wed, and fri  . Diverticulosis of colon without hemorrhage 01/02/2016   Colonoscopy July 2017 - One 3 mm polyp in the transverse colon, removed with a cold biopsy forceps. Resected and retrieved. - One 3 mm polyp in the rectum, removed with a cold biopsy forceps. Resected and retrieved. - Diverticulosis in the entire examined colon. - Non-bleeding internal hemorrhoids. - The examination was otherwise normal. - Significant looping which prolonged cecal    . DM (diabetes mellitus), type 2 with renal complications ([place]) 7/86/7672  . Elevated total protein 09/06/2015   Labs 08/2015 - obtain ROI for renal records at next appointment (look for SPEP/UPEP)   . Essential hypertension 11/01/2009   Qualifier: Diagnosis of  By: Selena Batten CMA, Jewel    . Heart murmur   . Heart murmur, systolic 0/94/7096  . Hyperlipidemia   . Hypertension   . Left atrial dilation 09/19/2015   Echo 09/2015   . MI 11/01/2009   Qualifier: Diagnosis of  By: Selena Batten CMA, Jewel    . Mitral valve regurgitation 09/19/2015   Echo 09/2015   . Myocardial infarction ([place])    10 yrs ago per pt  . Obstructive apnea 08/31/2015  . OSA treated with BiPAP 06/17/2010  . Sleep apnea    cpap nightly  . Wears glasses   .  Wears partial dentures    Past Surgical History:  Procedure Laterality Date  . AV FISTULA PLACEMENT Left 09/27/2014  . INSERTION OF ARTERIOVENOUS (AV) ARTEGRAFT ARM Left 04/02/2018   Procedure: INSERTION OF ARTERIOVENOUS (AV) ARTEGRAFT ARM LEFT UPPER ARM;  Surgeon: Waynetta Sandy, MD;  Location: [facility];  Service: Vascular;  Laterality: Left;  . INSERTION OF DIALYSIS CATHETER N/A 04/02/2018   Procedure: INSERTION OF DIALYSIS CATHETER, right  internal jugular;  Surgeon: Waynetta Sandy, MD;  Location: [place];  Service: Vascular;  Laterality: N/A;  . NECK SURGERY     C6 & C7 replaced 30 yrs ago per pt  . REVISON OF ARTERIOVENOUS FISTULA Left 04/02/2018   Procedure: REVISION PLICATION OF ARTERIOVENOUS FISTULA ARM;  Surgeon: Waynetta Sandy, MD;  Location: [facility];  Service: Vascular;  Laterality: Left;  . WISDOM TOOTH EXTRACTION       A IV Location/Drains/Wounds Patient Lines/Drains/Airways Status   Active Line/Drains/Airways    Name:   Placement date:   Placement time:   Site:   Days:   Fistula / Graft Left Upper arm Arteriovenous fistula   -    -    Upper arm      Peripheral IV (Ped) 04/14/19 Forearm   04/14/19    0030     less than 1   Hemodialysis Catheter Right Internal jugular Permanent   04/02/18    1012    Internal jugular   377   Incision (Closed) 04/02/18 Arm Left   04/02/18    1014     377   Incision (Closed) 04/02/18 Chest Right   04/02/18    1014     377          Intake/Output Last 24 hours  Intake/Output Summary (Last 24 hours) at 04/14/2019 2258 Last data filed at 04/14/2019 1112 Gross per 24 hour  Intake 600 ml  Output -  Net 600 ml    Labs/Imaging Results for orders placed or performed during the hospital encounter of 04/13/19 (from the past 48 hour(s))  Lipase, blood     Status: None   Collection Time: 04/13/19 11:16 AM  Result Value Ref Range   Lipase 37 11 - 51 U/L    Comment: Performed at [facility], [address] [place], [place] [address]  Comprehensive metabolic panel     Status: Abnormal   Collection Time: 04/13/19 11:16 AM  Result Value Ref Range   Sodium 135 135 - 145 mmol/L   Potassium 4.1 3.5 - 5.1 mmol/L   Chloride 90 (L) 98 - 111 mmol/L   CO2 26 22 - 32 mmol/L   Glucose, Bld 95 70 - 99 mg/dL   BUN 48 (H) 6 - 20 mg/dL   Creatinine, Ser 13.63 (H) 0.61 - 1.24 mg/dL   Calcium 9.0 8.9 - 10.3 mg/dL   Total Protein 7.4 6.5 - 8.1 g/dL   Albumin 3.4 (L) 3.5  - 5.0 g/dL   AST 46 (H) 15 - 41 U/L   ALT 65 (H) 0 - 44 U/L   Alkaline Phosphatase 44 38 - 126 U/L   Total Bilirubin 1.0 0.3 - 1.2 mg/dL   GFR calc non Af Amer 4 (L) >60 mL/min   GFR calc Af Amer 4 (L) >60 mL/min   Anion gap 19 (H) 5 - 15    Comment: Performed at [facility], [place] [address], [place], [place] [address]  CBC     Status: Abnormal  Collection Time: 04/13/19 11:16 AM  Result Value Ref Range   WBC 4.2 4.0 - 10.5 K/uL   RBC 3.58 (L) 4.22 - 5.81 MIL/uL   Hemoglobin 10.1 (L) 13.0 - 17.0 g/dL   HCT 29.4 (L) 39.0 - 52.0 %   MCV 82.1 80.0 - 100.0 fL   MCH 28.2 26.0 - 34.0 pg   MCHC 34.4 30.0 - 36.0 g/dL   RDW 14.6 11.5 - 15.5 %   Platelets 199 150 - 400 K/uL   nRBC 0.0 0.0 - 0.2 %    Comment: Performed at [facility], [place] [address], [place], [place] [address]  SARS CORONAVIRUS 2 (TAT 6-24 HRS) Nasopharyngeal Nasopharyngeal Swab     Status: Abnormal   Collection Time: 04/14/19 12:01 AM   Specimen: Nasopharyngeal Swab  Result Value Ref Range   SARS Coronavirus 2 POSITIVE (A) NEGATIVE    Comment: RESULT CALLED TO, READ BACK BY AND VERIFIED WITH: MLeane Para 0618 04/14/2019 T. TYSOR (NOTE) SARS-CoV-2 target nucleic acids are DETECTED. The SARS-CoV-2 RNA is generally detectable in upper and lower respiratory specimens during the acute phase of infection. Positive results are indicative of active infection with SARS-CoV-2. Clinical  correlation with patient history and other diagnostic information is necessary to determine patient infection status. Positive results do  not rule out bacterial infection or co-infection with other viruses. The expected result is Negative. Fact Sheet for Patients: SugarRoll.be Fact Sheet for Healthcare Providers: https://www.woods-mathews.com/ This test is not yet approved or cleared by the [place] FDA and  has been authorized for detection and/or diagnosis of SARS-CoV-2 by FDA  under an Emergency Use Authorization (EUA). This EUA will remain  in effect (meaning this test can be used) f or the duration of the COVID-19 declaration under Section 564(b)(1) of the Act, 21 U.S.C. section 360bbb-3(b)(1), unless the authorization is terminated or revoked sooner. Performed at [facility], [place] [address] [place], [place] [address]   HIV Antibody (routine testing w rflx)     Status: None   Collection Time: 04/14/19  2:00 AM  Result Value Ref Range   HIV Screen 4th Generation wRfx NON REACTIVE NON REACTIVE    Comment: Performed at [facility] [address], [place], [place] [address]  Hemoglobin A1c     Status: Abnormal   Collection Time: 04/14/19  2:00 AM  Result Value Ref Range   Hgb A1c MFr Bld 5.8 (H) 4.8 - 5.6 %    Comment: (NOTE) Pre diabetes:          5.7%-6.4% Diabetes:              >6.4% Glycemic control for   <7.0% adults with diabetes    Mean Plasma Glucose 119.76 mg/dL    Comment: Performed at [facility] [address] [place], [place] [address]  Brain natriuretic peptide     Status: Abnormal   Collection Time: 04/14/19  2:00 AM  Result Value Ref Range   B Natriuretic Peptide 1,317.6 (H) 0.0 - 100.0 pg/mL    Comment: Performed at [facility] [address], [place],  [address]  Comprehensive metabolic panel     Status: Abnormal   Collection Time: 04/14/19  2:00 AM  Result Value Ref Range   Sodium 138 135 - 145 mmol/L   Potassium 4.3 3.5 - 5.1 mmol/L   Chloride 91 (L) 98 - 111 mmol/L   CO2 27 22 - 32 mmol/L  Glucose, Bld 86 70 - 99 mg/dL   BUN 64 (H) 6 - 20 mg/dL   Creatinine, Ser 15.66 (H) 0.61 - 1.24 mg/dL   Calcium 8.7 (L) 8.9 - 10.3 mg/dL   Total Protein 7.5 6.5 - 8.1 g/dL   Albumin 3.3 (L) 3.5 - 5.0 g/dL   AST 42 (H) 15 - 41 U/L   ALT 62 (H) 0 - 44 U/L   Alkaline Phosphatase 52 38 - 126 U/L   Total Bilirubin 1.1 0.3 - 1.2 mg/dL   GFR calc non Af Amer 3 (L) >60 mL/min   GFR calc Af Amer 4 (L) >60  mL/min   Anion gap 20 (H) 5 - 15    Comment: Performed at [facility], [address] [place], [place] [address]  CBC     Status: Abnormal   Collection Time: 04/14/19  2:00 AM  Result Value Ref Range   WBC 8.1 4.0 - 10.5 K/uL   RBC 3.59 (L) 4.22 - 5.81 MIL/uL   Hemoglobin 10.1 (L) 13.0 - 17.0 g/dL   HCT 29.0 (L) 39.0 - 52.0 %   MCV 80.8 80.0 - 100.0 fL   MCH 28.1 26.0 - 34.0 pg   MCHC 34.8 30.0 - 36.0 g/dL   RDW 14.5 11.5 - 15.5 %   Platelets 216 150 - 400 K/uL   nRBC 0.0 0.0 - 0.2 %    Comment: Performed at [facility], [place] [address], [place], [place] [address]  Lactate dehydrogenase     Status: Abnormal   Collection Time: 04/14/19  2:00 AM  Result Value Ref Range   LDH 329 (H) 98 - 192 U/L    Comment: Performed at [facility], [place] [address], [place], [place] [address]  C-reactive protein     Status: Abnormal   Collection Time: 04/14/19  2:00 AM  Result Value Ref Range   CRP 25.9 (H) <1.0 mg/dL    Comment: Performed at [facility] [address], [place], [place] [address]  D-dimer, quantitative (not at [facility])     Status: Abnormal   Collection Time: 04/14/19  2:00 AM  Result Value Ref Range   D-Dimer, Quant 1.67 (H) 0.00 - 0.50 ug/mL-FEU    Comment: (NOTE) At the manufacturer cut-off of 0.50 ug/mL FEU, this assay has been documented to exclude PE with a sensitivity and negative predictive value of 97 to 99%.  At this time, this assay has not been approved by the FDA to exclude DVT/VTE. Results should be correlated with clinical presentation. Performed at [facility], [place] [address], [place], [place] [address]   Fibrinogen     Status: Abnormal   Collection Time: 04/14/19  2:00 AM  Result Value Ref Range   Fibrinogen 575 (H) 210 - 475 mg/dL    Comment: Performed at [facility] [address] [place], [place] [address]  Troponin I (High Sensitivity)     Status: Abnormal   Collection Time: 04/14/19  2:00 AM  Result Value Ref  Range   Troponin I (High Sensitivity) 2,115 (HH) <18 ng/L    Comment: CRITICAL RESULT CALLED TO, READ BACK BY AND VERIFIED WITH: HARDY R,RN 04/14/19 0333 WAYK Performed at [facility] [address], [place], [place] [address]   CK     Status: None   Collection Time: 04/14/19  2:00 AM  Result Value Ref Range   Total CK 371 49 - 397  U/L    Comment: Performed at [facility], [place] [address], [place], [place] [address]  Protime-INR     Status: None   Collection Time: 04/14/19  2:00 AM  Result Value Ref Range   Prothrombin Time 14.0 11.4 - 15.2 seconds   INR 1.1 0.8 - 1.2    Comment: (NOTE) INR goal varies based on device and disease states. Performed at [facility], [place] [address] [place], [place] [address]   Lipid panel     Status: Abnormal   Collection Time: 04/14/19  2:00 AM  Result Value Ref Range   Cholesterol 207 (H) 0 - 200 mg/dL   Triglycerides 213 (H) <150 mg/dL   HDL 20 (L) >40 mg/dL   Total CHOL/HDL Ratio 10.4 RATIO   VLDL 43 (H) 0 - 40 mg/dL   LDL Cholesterol 144 (H) 0 - 99 mg/dL    Comment:        Total Cholesterol/HDL:CHD Risk Coronary Heart Disease Risk Table                     Men   Women  1/2 Average Risk   3.4   3.3  Average Risk       5.0   4.4  2 X Average Risk   9.6   7.1  3 X Average Risk  23.4   11.0        Use the calculated Patient Ratio above and the CHD Risk Table to determine the patient's CHD Risk.        ATP III CLASSIFICATION (LDL):  <100     mg/dL   Optimal  100-129  mg/dL   Near or Above                    Optimal  130-159  mg/dL   Borderline  160-189  mg/dL   High  >190     mg/dL   Very High Performed at [facility] [address], [place], [place] [address]   Troponin I (High Sensitivity)     Status: Abnormal   Collection Time: 04/14/19  3:14 AM  Result Value Ref Range   Troponin I (High Sensitivity) 1,705 (HH) <18 ng/L    Comment: CRITICAL VALUE NOTED.  VALUE IS CONSISTENT WITH PREVIOUSLY REPORTED  AND CALLED VALUE. Performed at [facility], [place] [address], [place], [place] [address]   I-STAT 7, (LYTES, BLD GAS, ICA, H+H)     Status: Abnormal   Collection Time: 04/14/19  4:19 AM  Result Value Ref Range   pH, Arterial 7.484 (H) 7.350 - 7.450   pCO2 arterial 39.0 32.0 - 48.0 mmHg   pO2, Arterial 80.0 (L) 83.0 - 108.0 mmHg   Bicarbonate 29.2 (H) 20.0 - 28.0 mmol/L   TCO2 30 22 - 32 mmol/L   O2 Saturation 96.0 %   Acid-Base Excess 6.0 (H) 0.0 - 2.0 mmol/L   Sodium 134 (L) 135 - 145 mmol/L   Potassium 4.7 3.5 - 5.1 mmol/L   Calcium, Ion 1.05 (L) 1.15 - 1.40 mmol/L   HCT 30.0 (L) 39.0 - 52.0 %   Hemoglobin 10.2 (L) 13.0 - 17.0 g/dL   Patient temperature 99.7 F    Collection site RADIAL, ALLEN'S TEST ACCEPTABLE    Drawn by RT    Sample type ARTERIAL   CBG monitoring, ED     Status: Abnormal   Collection Time: 04/14/19 12:28 PM  Result  Value Ref Range   Glucose-Capillary 259 (H) 70 - 99 mg/dL  CBG monitoring, ED     Status: Abnormal   Collection Time: 04/14/19  4:34 PM  Result Value Ref Range   Glucose-Capillary 229 (H) 70 - 99 mg/dL  CBG monitoring, ED     Status: Abnormal   Collection Time: 04/14/19  9:43 PM  Result Value Ref Range   Glucose-Capillary 171 (H) 70 - 99 mg/dL   Dg Chest Portable 1 View  Result Date: 04/13/2019 CLINICAL DATA:  Shortness of breath, history of CHF and dialysis requirement. EXAM: PORTABLE CHEST 1 VIEW COMPARISON:  Radiograph 04/02/2018 FINDINGS: Widespread interstitial and airspace opacity throughout both lungs. Pulmonary vascularity is indistinct. The cardiac silhouette is lobular and enlarged though similar to comparison exams. No pneumothorax or visible effusion. No acute osseous or soft tissue abnormality. IMPRESSION: 1. Widespread interstitial and airspace opacity throughout both lungs, consistent with interstitial and alveolar pulmonary edema given history of CHF. Early infection could have a similar appearance in the appropriate clinical  setting. 2. Unchanged enlargement of the cardiac silhouette, which could reflect cardiomegaly or pericardial effusion. Electronically Signed   By: Lovena Le M.D.   On: 04/13/2019 23:20    Pending Labs Unresulted Labs (From admission, onward)    Start     Ordered   04/14/19 0349  Blood gas, arterial  Once,   R     04/14/19 0349   04/13/19 1026  Urinalysis, Routine w reflex microscopic  ONCE - STAT,   STAT     04/13/19 1026          Vitals/Pain Today's Vitals   04/14/19 2145 04/14/19 2214 04/14/19 2215 04/14/19 2216  BP: 120/70  (!) 103/46   Pulse: 77  76   Resp:      Temp:      TempSrc:      SpO2: 90% 97% 95% 96%  Weight:      Height:      PainSc: 0-No pain       Isolation Precautions Airborne and Contact precautions  Medications Medications  acetaminophen (TYLENOL) tablet 500 mg (500 mg Oral Given 04/14/19 0928)  atorvastatin (LIPITOR) tablet 40 mg (40 mg Oral Given 04/14/19 0928)  carvedilol (COREG) tablet 25 mg (25 mg Oral Given 04/14/19 1638)  minoxidil (LONITEN) tablet 20 mg (20 mg Oral Given 04/14/19 2146)  aspirin chewable tablet 81 mg (81 mg Oral Given 04/14/19 0928)  heparin injection 5,000 Units (5,000 Units Subcutaneous Given 04/14/19 2146)  remdesivir 200 mg in sodium chloride 0.9 % 250 mL IVPB (0 mg Intravenous Stopped 04/14/19 1112)    Followed by  remdesivir 100 mg in sodium chloride 0.9 % 250 mL IVPB (has no administration in time range)  gabapentin (NEURONTIN) capsule 300 mg (300 mg Oral Given 04/14/19 2145)  insulin aspart (novoLOG) injection 0-9 Units (3 Units Subcutaneous Given 04/14/19 1635)  linagliptin (TRADJENTA) tablet 5 mg (5 mg Oral Given 04/14/19 1223)  dexamethasone (DECADRON) tablet 6 mg (has no administration in time range)  Chlorhexidine Gluconate Cloth 2 % PADS 6 each (has no administration in time range)  dexamethasone (DECADRON) injection 10 mg (10 mg Intravenous Given 04/14/19 0047)  azithromycin (ZITHROMAX) 500 mg in sodium  chloride 0.9 % 250 mL IVPB (0 mg Intravenous Stopped 04/14/19 0351)  cefTRIAXone (ROCEPHIN) 2 g in sodium chloride 0.9 % 100 mL IVPB (0 g Intravenous Stopped 04/14/19 0140)    Mobility walks Low fall risk   Focused Assessments Pulmonary Assessment Handoff:  Lung sounds: Bilateral Breath Sounds: Diminished L Breath Sounds: Clear, Diminished R Breath Sounds: Coarse crackles O2 Device: High Flow Nasal Cannula O2 Flow Rate (L/min): 2 L/min      R Recommendations: See Admitting Provider Note  Report given to:   Additional Notes:

## 2019-04-14 NOTE — ED Notes (Signed)
Admitting at bedside 

## 2019-04-14 NOTE — Discharge Summary (Addendum)
Troy Wells Discharge Summary  Patient name: Troy Wells Medical record number: [id number] Date of birth: 1966/02/12 Age: 53 y.o. Gender: male Date of Admission: 04/13/2019  Date of Discharge: 04/20/2019 Admitting Physician: [name], MD  Primary Care Provider: [name], MD Consultants: CCM, nephrology  Indication for Hospitalization: Shortness of Breath   Discharge Diagnoses/Problem List:  Principal Problem:   Lower respiratory tract infection due to COVID-19 virus Active Problems:   Acute hypoxemic respiratory failure ([place]) - resolved   Non-ST elevation (NSTEMI) myocardial infarction ([place]) during admission   OSA treated with BiPAP   End-stage renal disease on hemodialysis ([place])   DM (diabetes mellitus), type 2 with renal complications ([place])   Hypertension associated with diabetes ([place])   Pulmonary hypertension ([place])     Morbid obesity ([place])      Disposition: Discharge home  Discharge Condition: Stable, improved  Discharge Exam:  Physical Exam VS reviewed A&O x4 LUNG: BCTA COR: RRR, no MGR EXT: no C/C/E Neuro: Normal gait.   Brief Wells Course:   Troy Wells is a 53 y.o. admitted after presenting with SOB, hemoptysis, and diarrhea found to have Pneumonia secondary to COVID. He was also found to have critically elevated troponin at 2115. Due to elevated BNP of 1317, an echo was performed and revealed decreased EF from 60 down to 40% with wall motion abnormalities. The patient's EKG never showed an ST-segment changes and the patient denied active chest pain. Due to wall motion abnormalities and decreased EF on Echo, as well as elevated troponins, he was placed on heparin drip x48 hours from 11/1-11/3. The patient was treated with azithromycin, CTX, 5-day course of Remdesivir and 10-day Decadron course was initiated.  Nephrology following the patient and provided him with HD during his Wells stay.  Patient had an  episode of hyperkalemia at 6.0, which was treated and quickly normalized with [facility] and HD.  The patient also exhibited some elevated transaminases while hospitalized; however, he remained stable and asymptomatic.  The elevated transaminases are most likely due to remdesivir or coronavirus, and should be followed up outpatient.  The patient was ambulated with pulse ox and did not desat below 95% on room air.  Patient was deemed stable for discharge home on 04/20/2019.  Outpatient HD was arranged for the patient as he continues to recover from [place] at [place]. He was stable for discharge   Issues for Follow Up:  1. Patient to follow up with cardiology for possible nuclear stress test outpatient in ~ 1 month after discharge. 2. Patient will attend [facility] for HD TTS while recovering from [place]. Once the patient has 2 negative COVID tests (which will be performed by the HD Clinic) he may return to his normal HD [place] for future dialysis sessions. 3.   Please monitor his blood sugars while on steroids. Continue Tradjenta 5mg  daily.   Significant Procedures:  Echo 04/16/2019  Significant Labs and Imaging:  Recent Labs  Lab 04/18/19 0604 04/19/19 0644 04/20/19 0315  WBC 6.5 8.2 7.8  HGB 9.3* 9.0* 9.2*  HCT 27.2* 26.2* 26.6*  PLT 285 310 343   Recent Labs  Lab 04/14/19 0200  04/16/19 0418 04/17/19 0413 04/18/19 0604 04/18/19 1753 04/19/19 0644 04/20/19 0315  NA 138   < > 140 137 136 137 134* 137  K 4.3   < > 5.0 5.3* 6.0* 5.7* 5.2* 4.3  CL 91*   < > 99 96* 95* 94* 93* 94*  CO2 27   < > 20* 20* 22 24 21* 25  GLUCOSE 86   < > 133* 150* 150* 220* 132* 138*  BUN 64*   < > 71* 106* 79* 91* 105* 73*  CREATININE 15.66*   < > 12.44* 14.79* 10.76* 11.67* 13.05* 9.69*  CALCIUM 8.7*   < > 8.9 9.1 9.5 9.7 9.2 9.4  MG  --   --  2.2  --   --   --   --   --   ALKPHOS 52  --   --  47 50  --  52 50  AST 42*  --   --  53* 119*  --  82* 85*  ALT 62*  --   --  90* 163*  --  180*  198*  ALBUMIN 3.3*  --   --  3.2* 3.2*  --  3.1* 3.2*   < > = values in this interval not displayed.   04/19/2019 Ambulatory Pulse Ox results: SATURATION QUALIFICATIONS: (This note is used to comply with regulatory documentation for home oxygen) - Patient Saturations on Room Air at Rest = 97% - Patient Saturations on Room Air while Ambulating = 95% - Patient Saturations on 0 Liters of oxygen while Ambulating = 95% - Please briefly explain why patient needs home oxygen: Oxygen levels stayed in the mid 90's on room air while ambulating  Dg Chest Portable 1 View  Result Date: 04/13/2019 CLINICAL DATA:  Shortness of breath, history of CHF and dialysis requirement. EXAM: PORTABLE CHEST 1 VIEW COMPARISON:  Radiograph 04/02/2018 FINDINGS: Widespread interstitial and airspace opacity throughout both lungs. Pulmonary vascularity is indistinct. The cardiac silhouette is lobular and enlarged though similar to comparison exams. No pneumothorax or visible effusion. No acute osseous or soft tissue abnormality. IMPRESSION: 1. Widespread interstitial and airspace opacity throughout both lungs, consistent with interstitial and alveolar pulmonary edema given history of CHF. Early infection could have a similar appearance in the appropriate clinical setting. 2. Unchanged enlargement of the cardiac silhouette, which could reflect cardiomegaly or pericardial effusion. Electronically Signed   By: Lovena Le M.D.   On: 04/13/2019 23:20    Results/Tests Pending at Time of Discharge: None  Discharge Medications:  Allergies as of 04/20/2019   No Active Allergies     Medication List    TAKE these medications   albuterol 108 (90 Base) MCG/ACT inhaler Commonly known as: VENTOLIN HFA Inhale 1-2 puffs into the lungs every 6 (six) hours as needed for wheezing or shortness of breath.   allopurinol 100 MG tablet Commonly known as: ZYLOPRIM Take 100 mg by mouth 2 (two) times daily.   amLODipine 10 MG  tablet Commonly known as: NORVASC Take 10 mg by mouth daily.   aspirin 81 MG tablet Take 81 mg by mouth daily. Reported on 01/02/2016   atorvastatin 80 MG tablet Commonly known as: LIPITOR Take 1 tablet (80 mg total) by mouth daily. Start taking on: April 21, 2019 What changed:   medication strength  how much to take   Auryxia 1 GM 210 MG(Fe) tablet Generic drug: ferric citrate Take 420-840 mg by mouth See admin instructions. 840 mg with meals, and 420 mg with snacks   carvedilol 25 MG tablet Commonly known as: COREG Take 1 tablet (25 mg total) by mouth 2 (two) times daily with a meal.   cetirizine 5 MG tablet Commonly known as: ZYRTEC Take 1 tablet (5 mg total) by mouth daily. What changed:   when to take  this  reasons to take this   colchicine 0.6 MG tablet Take 0.6 mg by mouth 2 (two) times daily as needed (gout).   dexamethasone 6 MG tablet Commonly known as: DECADRON Take 1 tablet (6 mg total) by mouth daily for 2 days. Start taking on: April 21, 2019   gabapentin 300 MG capsule Commonly known as: NEURONTIN TAKE ONE CAPSULE BY MOUTH THREE TIMES DAILY What changed: when to take this   glipiZIDE 5 MG tablet Commonly known as: GLUCOTROL Take 5 mg by mouth daily.   linagliptin 5 MG Tabs tablet Commonly known as: TRADJENTA Take 1 tablet (5 mg total) by mouth daily.   minoxidil 10 MG tablet Commonly known as: LONITEN Take 2 tablets (20 mg total) by mouth 2 (two) times daily.   multivitamin Tabs tablet Take 1 tablet by mouth at bedtime.   PRESCRIPTION MEDICATION Pt has CPAP machine   torsemide 100 MG tablet Commonly known as: DEMADEX Take 100 mg by mouth See admin instructions. Take on Tuesday, Thursday, Saturday, and Sunday     Discharge Instructions: Please refer to Patient Instructions section of EMR for full details.  Patient was counseled important signs and symptoms that should prompt return to medical care, changes in medications, dietary  instructions, activity restrictions, and follow up appointments.   Follow-Up Appointments: Follow-up Information    [place]. Go on 04/23/2019.   Why: 01:30 VIRTUAL Appointment. Office will call you.  Contact information: [place]        Friday, Nov 5th with [name]  Bonnita Hollow, MD 04/20/2019, 12:59 PM PGY-1, [place]

## 2019-04-14 NOTE — ED Notes (Addendum)
This RN attempted to call report and was told that the nurse taking over care of this pt Wells Guiles RN) would call back when she stepped away.

## 2019-04-14 NOTE — ED Notes (Signed)
Critical value.... MD notified

## 2019-04-14 NOTE — Progress Notes (Signed)
FPTS Interim Progress Note  S: Received page for critical lab value, patient's troponin elevated at 2,115. Patient not complaining of chest pain. Discussed case with cardiology fellow on call.  With the patient's history of ESRD, patient is likely having difficulty clearing troponin and levels are likely elevated due to high suspicion for COVID PNA. Will continue to monitor patient for development of symptoms concerning for cardiac related ischemia while monitoring patient's respiratory status. Patient's EKG on admission without findings concerning for active MI or cardiac ischemia. COVID testing still pending at this time.  O: BP 125/75   Pulse 92   Temp 99.4 F (37.4 C) (Oral)   Resp (!) 35   Ht 6' (1.829 m)   Wt 117.9 kg   SpO2 93%   BMI 35.26 kg/m      Troy Klein, MD 04/14/2019, 3:54 AM PGY-1, [place] Medicine Service pager [phone]

## 2019-04-14 NOTE — Progress Notes (Addendum)
FPTS Interim Progress Note  S: Patient on HFNC 10 liters and continues to have  increased WOB, oxygen saturations improved to 98% from 92-94% Most recent ABG 7.48/39/80/29.2.  Labs that have resulted thus far:   Trop 2115>1700  D-dimer elevated 1.67  Fibrinogen elevated 575   BNP elevated 1317  LDH elevated 329  CRP elevated 25.9  CK 371 WNL  hbg A1c 5.8 WNL  COVID +     O: BP 123/72   Pulse 87   Temp 99.4 F (37.4 C) (Oral)   Resp (!) 31   Ht 6' (1.829 m)   Wt 117.9 kg   SpO2 98%   BMI 35.26 kg/m   Physical exam to be completed by attending, Dr.Hensel  A/P: -COVID-19 POSITIVE -Continue supplemental oxygen with goal to maintain saturations>92%  -Plan to complete 10 day course of Dexamethasone 10mg x1, 6mg  for 9 additional doses   -Will not continue azithromycin nor CTX, per recommendations from CCM  -CCM consulted for recommendations regarding starting remdesivir, will follow up for further recommendations  -added heparin, dosing per pharmacy for DVT ppx  -nephrology consulted for HD  -DM: holding home glipizide, will order sensitive sliding scale insulin  -Gout: patient without symptoms, will hold allopurinol and colchicine for now -will not complete echocardiogram while inpatient  Stark Klein, MD 04/14/2019, 7:48 AM PGY-1, [place] Medicine Service pager [phone]

## 2019-04-14 NOTE — Consult Note (Signed)
Renal Service Consult Note [facility]  Troy Wells 04/14/2019 [name] Requesting Physician:  Dr Andria Frames, Viona Gilmore.   Reason for Consult:  ESRD pt w/ new COVID infection HPI: The patient is a 53 y.o. year-old w/ hx of OSA, MI, HTN, HL, DM2 and ESRD on HD presented to ED with c/o 2 day hx of SOB, chills and sweats, and diarrhea. Pt was exposed to COVID-19 as his daughter tested + 2 wks ago.  In ED CXR showed bilat diffuse infiltrates, pt required 10 L min HFNC.  He was given IV dexamethasone and abx for CAP.  Temp was 99.4, BP normal 125/67, HR 80 and RR 22-35.  Asked to see for ESRD.    Patient on HD MWF schedule, last HD was Monday.  Good compliance.   Seen in ED.  Pt is pleasant and other than cough, fevers , no sig c/o.  No CP. No recent access or HD issues. 2 years on HD.   ROS  denies CP  no joint pain   no HA  no blurry vision  no rash  no nausea/ vomiting     Past Medical History  Past Medical History:  Diagnosis Date  . Allergy   . Chronic kidney disease    Chronic Renal Insufficiency  . Coronary artery disease 08/15/2009   with AMI  . Diabetes mellitus without complication ([place])   . Dialysis patient [facility]) 09-27-2014   mon, wed, and fri  . Heart murmur   . Hyperlipidemia   . Hypertension   . Myocardial infarction ([place])    10 yrs ago per pt  . OSA treated with BiPAP 06/17/2010  . Sleep apnea    cpap nightly  . Wears glasses   . Wears partial dentures    Past Surgical History  Past Surgical History:  Procedure Laterality Date  . AV FISTULA PLACEMENT Left 09/27/2014  . INSERTION OF ARTERIOVENOUS (AV) ARTEGRAFT ARM Left 04/02/2018   Procedure: INSERTION OF ARTERIOVENOUS (AV) ARTEGRAFT ARM LEFT UPPER ARM;  Surgeon: Waynetta Sandy, MD;  Location: [place];  Service: Vascular;  Laterality: Left;  . INSERTION OF DIALYSIS CATHETER N/A 04/02/2018   Procedure: INSERTION OF DIALYSIS CATHETER, right internal jugular;  Surgeon: Waynetta Sandy, MD;  Location: [place];  Service: Vascular;  Laterality: N/A;  . NECK SURGERY     C6 & C7 replaced 30 yrs ago per pt  . REVISON OF ARTERIOVENOUS FISTULA Left 04/02/2018   Procedure: REVISION PLICATION OF ARTERIOVENOUS FISTULA ARM;  Surgeon: Waynetta Sandy, MD;  Location: [place];  Service: Vascular;  Laterality: Left;  . WISDOM TOOTH EXTRACTION     Family History  Family History  Problem Relation Age of Onset  . Hypertension Mother   . Heart disease Mother 44       cause of death  . Hypertension Father   . Aneurysm Father 69       brain aneurysm  . Heart disease Father 41       cause of death  . Hypertension Brother   . Alcohol abuse Brother   . Diabetes Brother   . Pancreatitis Brother   . Hypertension Brother   . Colon cancer Neg Hx    Social History  reports that he quit smoking about 7 years ago. His smoking use included cigars. He has a 10.00 pack-year smoking history. He has never used smokeless tobacco. He reports current alcohol use of about 1.0 standard drinks of alcohol per week.  He reports that he does not use drugs. Allergies No Known Allergies Home medications Prior to Admission medications   Medication Sig Start Date End Date Taking? Authorizing Provider  allopurinol (ZYLOPRIM) 100 MG tablet Take 100 mg by mouth 2 (two) times daily.  10/04/14  Yes [provider]  amLODipine (NORVASC) 10 MG tablet Take 10 mg by mouth daily.   Yes [provider]  aspirin 81 MG tablet Take 81 mg by mouth daily. Reported on 01/02/2016   Yes [provider]  atorvastatin (LIPITOR) 40 MG tablet TAKE ONE TABLET BY MOUTH ONCE DAILY 03/12/16  Yes [name], MD  AURYXIA 1 GM 210 MG(Fe) tablet Take 420-840 mg by mouth See admin instructions. 840 mg with meals, and 420 mg with snacks 01/11/19  Yes [provider]  carvedilol (COREG) 25 MG tablet Take 1 tablet (25 mg total) by mouth 2 (two) times daily with a meal. 08/31/15  Yes [name], MD  cetirizine (ZYRTEC) 5 MG tablet Take 1 tablet (5 mg total) by mouth daily. Patient taking differently: Take 5 mg by mouth daily as needed for allergies.  10/21/18  Yes [name], MD  colchicine 0.6 MG tablet Take 0.6 mg by mouth 2 (two) times daily as needed (gout).  12/11/10  Yes [provider]  gabapentin (NEURONTIN) 300 MG capsule TAKE ONE CAPSULE BY MOUTH THREE TIMES DAILY Patient taking differently: Take 300 mg by mouth at bedtime.  09/30/16  Yes [name], MD  glipiZIDE (GLUCOTROL) 5 MG tablet Take 5 mg by mouth daily. 10/04/14  Yes [provider]  minoxidil (LONITEN) 10 MG tablet Take 2 tablets (20 mg total) by mouth 2 (two) times daily. 09/11/17  Yes Hensel, Jamal Collin, MD  multivitamin (RENA-VIT) TABS tablet Take 1 tablet by mouth at bedtime.   Yes [provider]  PRESCRIPTION MEDICATION Pt has CPAP machine   Yes [provider]  torsemide (DEMADEX) 100 MG tablet Take 100 mg by mouth See admin instructions. Take on Tuesday, Thursday, Saturday, and Sunday    Yes [provider]   Liver Function Tests Recent Labs  Lab 04/13/19 1116 04/14/19 0200  AST 46* 42*  ALT 65* 62*  ALKPHOS 44 52  BILITOT 1.0 1.1  PROT 7.4 7.5  ALBUMIN 3.4* 3.3*   Recent Labs  Lab 04/13/19 1116  LIPASE 37   CBC Recent Labs  Lab 04/13/19 1116 04/14/19 0200 04/14/19 0419  WBC 4.2 8.1  --   HGB 10.1* 10.1* 10.2*  HCT 29.4* 29.0* 30.0*  MCV 82.1 80.8  --   PLT 199 216  --    Basic Metabolic Panel Recent Labs  Lab 04/13/19 1116 04/14/19 0200 04/14/19 0419  NA 135 138 134*  K 4.1 4.3 4.7  CL 90* 91*  --   CO2 26 27  --   GLUCOSE 95 86  --   BUN 48* 64*  --   CREATININE 13.63* 15.66*  --   CALCIUM 9.0 8.7*  --    Iron/TIBC/Ferritin/ %Sat No results found for: IRON, TIBC, FERRITIN, IRONPCTSAT  Vitals:   04/14/19 0300 04/14/19 0403 04/14/19 0500 04/14/19 0929  BP: 125/75 125/71 123/72 116/69  Pulse: 92 95 87 81  Resp:  (!) 35 (!) 32 (!) 31 (!) 22  Temp:    98.7 F (37.1 C)  TempSrc:    Oral  SpO2: 93% 92% 98% 97%  Weight:      Height:  Exam Gen heavy set AAM, no distress, lying at 30deg on nasal O2 No rash, cyanosis or gangrene Sclera anicteric, throat clear  No jvd or bruits Chest scattered rales bilat post , no wheezing, no ^wob RRR no MRG Abd soft ntnd no mass or ascites +bs GU normal male MS no joint effusions or deformity Ext  Trace pretib edema, no wounds or ulcers Neuro is alert, Ox 3 , nf LUA AVF / AVG    Home meds:  - amlodipine 10/ carvedilol 25 bid/ minoxidil 20 bid/ torsemide 100mg  tts  - glipizide 5 qd  - gabapentin 300 hs  - allopurinol 100 bid  - aspirin 81/ atorvastatin 40 qd  - prn's/ vitamins/ supplements     Outpt HD: [facility] [place]   4.5h  450/800  121.5kg  2/2 bath P4  Hep 60000+ 2572midrun  LUA AVF + interposition Artegraft 03/2018  - last HD 10/26, under edw  - mircera 75 q2, last 10/16  - calc 1.5 ug tiw  - home sensipar 90 qd/ velphoro 2ac    Assessment/ Plan: 1. COVID+ PNA/ hypoxemia - getting IV steroids, per PMD 2. ESRD - usual HD MWF. No indication acute HD today, will plan HD off schedule tomorrow am.  3. HTN - getting coreg / minoxidil at home dosing, as BP tolerates 4. Volume - left under dry wt last HD. Lower edw as tolerated here.  5. DM2    Kelly Splinter  MD 04/14/2019, 10:02 AM

## 2019-04-15 ENCOUNTER — Inpatient Hospital Stay (HOSPITAL_COMMUNITY): Payer: Medicare Other

## 2019-04-15 ENCOUNTER — Encounter (HOSPITAL_COMMUNITY): Payer: Self-pay | Admitting: Cardiology

## 2019-04-15 DIAGNOSIS — U071 COVID-19: Secondary | ICD-10-CM

## 2019-04-15 DIAGNOSIS — R7989 Other specified abnormal findings of blood chemistry: Secondary | ICD-10-CM

## 2019-04-15 DIAGNOSIS — E1159 Type 2 diabetes mellitus with other circulatory complications: Secondary | ICD-10-CM | POA: Diagnosis not present

## 2019-04-15 DIAGNOSIS — E1122 Type 2 diabetes mellitus with diabetic chronic kidney disease: Secondary | ICD-10-CM | POA: Diagnosis not present

## 2019-04-15 DIAGNOSIS — J22 Unspecified acute lower respiratory infection: Secondary | ICD-10-CM | POA: Diagnosis not present

## 2019-04-15 DIAGNOSIS — J9601 Acute respiratory failure with hypoxia: Secondary | ICD-10-CM | POA: Diagnosis not present

## 2019-04-15 LAB — MRSA PCR SCREENING: MRSA by PCR: NEGATIVE

## 2019-04-15 LAB — GLUCOSE, CAPILLARY
Glucose-Capillary: 130 mg/dL — ABNORMAL HIGH (ref 70–99)
Glucose-Capillary: 193 mg/dL — ABNORMAL HIGH (ref 70–99)
Glucose-Capillary: 99 mg/dL (ref 70–99)
Glucose-Capillary: 195 mg/dL — ABNORMAL HIGH (ref 70–99)

## 2019-04-15 LAB — CBC
HCT: 28.4 % — ABNORMAL LOW (ref 39.0–52.0)
Hemoglobin: 9.8 g/dL — ABNORMAL LOW (ref 13.0–17.0)
MCH: 27.5 pg (ref 26.0–34.0)
MCHC: 34.5 g/dL (ref 30.0–36.0)
MCV: 79.8 fL — ABNORMAL LOW (ref 80.0–100.0)
Platelets: 235 10*3/uL (ref 150–400)
RBC: 3.56 MIL/uL — ABNORMAL LOW (ref 4.22–5.81)
RDW: 14.3 % (ref 11.5–15.5)
WBC: 6.3 10*3/uL (ref 4.0–10.5)
nRBC: 0 % (ref 0.0–0.2)

## 2019-04-15 LAB — BASIC METABOLIC PANEL
Anion gap: 23 — ABNORMAL HIGH (ref 5–15)
BUN: 106 mg/dL — ABNORMAL HIGH (ref 6–20)
CO2: 23 mmol/L (ref 22–32)
Calcium: 9.6 mg/dL (ref 8.9–10.3)
Chloride: 90 mmol/L — ABNORMAL LOW (ref 98–111)
Creatinine, Ser: 18.59 mg/dL — ABNORMAL HIGH (ref 0.61–1.24)
GFR calc Af Amer: 3 mL/min — ABNORMAL LOW (ref >60)
GFR calc non Af Amer: 2 mL/min — ABNORMAL LOW (ref >60)
Glucose, Bld: 170 mg/dL — ABNORMAL HIGH (ref 70–99)
Potassium: 4.7 mmol/L (ref 3.5–5.1)
Sodium: 136 mmol/L (ref 135–145)

## 2019-04-15 LAB — TROPONIN I (HIGH SENSITIVITY): Troponin I (High Sensitivity): 627 ng/L (ref <18)

## 2019-04-15 MED ORDER — HEPARIN SODIUM (PORCINE) 1000 UNIT/ML IJ SOLN
INTRAMUSCULAR | Status: AC
  Administered 2019-04-15: 1000 [IU]
  Filled 2019-04-15: qty 6

## 2019-04-15 MED ORDER — ATORVASTATIN CALCIUM 80 MG PO TABS
80.0000 mg | ORAL_TABLET | Freq: Every day | ORAL | Status: DC
  Administered 2019-04-16 – 2019-04-20 (×5): 80 mg via ORAL
  Filled 2019-04-15 (×5): qty 1

## 2019-04-15 MED ORDER — DARBEPOETIN ALFA 60 MCG/0.3ML IJ SOSY
60.0000 ug | PREFILLED_SYRINGE | INTRAMUSCULAR | Status: DC
  Filled 2019-04-15: qty 0.3

## 2019-04-15 NOTE — Progress Notes (Signed)
Family Medicine Teaching Service Daily Progress Note Intern Pager: [phone]  Patient name: Troy Wells Medical record number: [id number] Date of birth: 07-05-1965 Age: 53 y.o. Gender: male  Primary Care Provider: [name], MD Consultants: Nephrology Code Status: Full  Pt Overview and Major Events to Date:  10/28-admission for Covid positive  Assessment and Plan: Troy Wells is a 53 y.o. male presenting with SOB, chills and diarrhea for 3 days, patient also noted to have recent COVID-19 exposure. PMH is significant for ESRD MWF HD, OSA, HTN, HLD, CHFpEF, pHTN, DM, diabetic neuropathy.     Acute Hypoxic Respiratory Failure likely 2/2 COVID  Patient continues to be on high flow nasal cannula 5.5 to 6 L with O2 sats 91 to 97%.  Respiration rate 13-27 overnight.  No increased work of breathing.  Continues to be afebrile.  Normotensive overnight.  Tolerating remdesivir and Decadron. CCM following.  On exam lung sounds clear bilaterally.  -Monitor respiration status closely -Trait oxygen to maintain oxygen saturation greater than 92% -Continuous cardiac and pulse ox monitoring -Continue remdesivir(10/28-) per CCM -Continue Decadron 6 mg x 10 days(10/27-) -Antibiotics discontinued (10/28) -CBC, CK, CMP in a.m.  ESRD on HD MWF-dry weight 112.2 kg Patient appears euvolemic.  Increased work of breathing.  No recorded urine output.  Patient states he has not voided a lot. Dialysis days Mondays Wednesdays Fridays.  On exam appears euvolemic -For dialysis today -Nephrology following  CHFpEF 60-65% as of 2017  CAD Asymptomatic.  Patient appears euvolemic.  Lung sounds clear.  Troponins elevated. -Dialysis today -Repeat echo -Continue Coreg and aspirin -Repeat troponin -Cardiology consult  Hypertension Normotensive.  Home meds include minoxidil 20 twice daily, amlodipine 10 mg daily and Coreg 25 mg twice daily -Continue minoxidil and Coreg  OSA with use of CPAP   Patient uses CPAP at night.  In light of positive Covid unable to use wall in hospital.  HLD -Continue atorvastatin  Diabetes CBG 130 overnight.  Required 5 units insulin coverage yesterday Hemoglobin A1c 5.8 -Continue Tradjenta 5 mg daily -Sensitive scale insulin coverage meals  Diabetic Neuropathy  -Continuegabapentin 300 3 times daily  Gout  -Asymptomatic   FEN/GI:  -Heart healthy  Prophylaxis:  -Heparin 5000 units every 8    Disposition: Home when medically stable  Subjective:  No acute events overnight.  Denies any chest pain, shortness of breath, abdominal pain.  Continues to have some hemoptysis patient describes it as "flecks of blood in sputum ".  Appetite good.  Patient states he does not void a lot as he is a dialysis patient.  Objective: Temp:  [97.7 F (36.5 C)] 97.7 F (36.5 C) (10/29 0800) Pulse Rate:  [68-81] 70 (10/29 0800) Resp:  [13-28] 17 (10/29 0800) BP: (103-186)/(46-74) 124/73 (10/29 0800) SpO2:  [90 %-100 %] 97 % (10/29 0800) Physical Exam: General: 53 year old male in no acute distress Cardiovascular: Regular rate and rhythm, no murmurs appreciated, no gallops or rubs Respiratory: Clear to auscultation bilaterally, no crackles, no rhonchi appreciated Abdomen: Soft, nontender, nondistended, bowel sounds present, no organomegaly Extremities: Moving all extremities, small trace edema  bilateral feet.  Laboratory: Recent Labs  Lab 04/13/19 1116 04/14/19 0200 04/14/19 0419  WBC 4.2 8.1  --   HGB 10.1* 10.1* 10.2*  HCT 29.4* 29.0* 30.0*  PLT 199 216  --    Recent Labs  Lab 04/13/19 1116 04/14/19 0200 04/14/19 0419  NA 135 138 134*  K 4.1 4.3 4.7  CL 90* 91*  --   CO2  26 27  --   BUN 48* 64*  --   CREATININE 13.63* 15.66*  --   CALCIUM 9.0 8.7*  --   PROT 7.4 7.5  --   BILITOT 1.0 1.1  --   ALKPHOS 44 52  --   ALT 65* 62*  --   AST 46* 42*  --   GLUCOSE 95 86  --       Imaging/Diagnostic Tests:   Carollee Leitz,  MD 04/15/2019, 10:04 AM PGY-1, [place] Intern pager: [phone], text pages welcome

## 2019-04-15 NOTE — Consult Note (Addendum)
Cardiology Consultation:   Patient ID: Troy Tumolo; [id number]; 09/06/1965   Admit date: 04/13/2019 Date of Consult: 04/15/2019  Primary Care Provider: [name], MD Primary Cardiologist: No primary care provider on file. Primary Electrophysiologist:  None   Patient Profile:   Troy Wells is a 53 y.o. male with a PMH of non-obstructive CAD on LHC in 6010, chronic diastolic CHF, pulmonary HTN, HTN, HLD, OSA on CPAP, DM type 2 with neuropathy, and ESRD on HD M/W/F, who is being seen today for the evaluation of elevated troponins at the request of Dr. Andria Frames.  History of Present Illness:   Troy Wells presented to the ED 04/14/2019 with complaints of SOB, chills, cough, hemoptysis, and diarrhea x3 days. He reported being in contact with his daughter who recently tested positive for COVID-19. He was found to be positive for COVID-19 and was subsequently admitted to the hospital. CXR showed bilateral interstitial opacities c/f pulmonary edema vs early infection. He has been hypoxic on room air, currently requiring HFNC to maintain O2 sats in the 90s. BNP was elevated to 1317, though patient was felt to be euvolemic on exam. HsT elevated to [phone]. EKG with sinus rhythm with early repol, rate 86 bpm, QTc 490, LAE, LAFB, no STE/D. Cardiology asked to evaluate for elevated troponins.  He was last evaluated by cardiology at an outpatient visit with Dr. Meda Coffee in 2017, at which time he had complaints of DOE. He was found to have STD/TWI in infereolateral leads and was recommended to undergo a NST at that time. He underwent a dobutamine stress test which was negative for ischemia.   At the time of this evaluation he is receiving dialysis. He reports some improvement in breathing since presentation. He denies any recent complaints of chest pain. Prior to this admission he was fairly active and could mow his lawn and walk without having chest pain or SOB. He denies recent  palpitations, dizziness, lightheadedness, or syncope. He reports occasional LE edema managed with dialysis. He has had orthopnea with present illness but not prior to this admission. He reports compliance with his CPAP and denies PND.   Past Medical History:  Diagnosis Date   Allergy    CARDIAC ARREST 11/01/2009   Qualifier: Diagnosis of  By: Selena Batten CMA, Jewel     Chronic kidney disease    Chronic Renal Insufficiency   Colon polyps 01/02/2016   Colonoscopy July 2017 - One 3 mm polyp in the transverse colon, removed with a cold biopsy forceps. Resected and retrieved. - One 3 mm polyp in the rectum, removed with a cold biopsy forceps. Resected and retrieved. - Diverticulosis in the entire examined colon. - Non-bleeding internal hemorrhoids. - The examination was otherwise normal. - Significant looping which prolonged cecal     Coronary artery disease 08/15/2009   with AMI   Diabetes mellitus without complication [facility])    Dialysis patient ([place]) 09-27-2014   mon, wed, and fri   Diverticulosis of colon without hemorrhage 01/02/2016   Colonoscopy July 2017 - One 3 mm polyp in the transverse colon, removed with a cold biopsy forceps. Resected and retrieved. - One 3 mm polyp in the rectum, removed with a cold biopsy forceps. Resected and retrieved. - Diverticulosis in the entire examined colon. - Non-bleeding internal hemorrhoids. - The examination was otherwise normal. - Significant looping which prolonged cecal     DM (diabetes mellitus), type 2 with renal complications ([place]) 2/54/2706   Elevated total protein 09/06/2015   Labs 08/2015 -  obtain ROI for renal records at next appointment (look for SPEP/UPEP)    Essential hypertension 11/01/2009   Qualifier: Diagnosis of  By: Selena Batten CMA, Jewel     Heart murmur    Heart murmur, systolic 9/56/2130   Hyperlipidemia    Hypertension    Left atrial dilation 09/19/2015   Echo 09/2015    MI 11/01/2009   Qualifier: Diagnosis of  By:  Selena Batten CMA, Jewel     Mitral valve regurgitation 09/19/2015   Echo 09/2015    Myocardial infarction ([place])    10 yrs ago per pt   Obstructive apnea 08/31/2015   OSA treated with BiPAP 06/17/2010   Sleep apnea    cpap nightly   Wears glasses    Wears partial dentures     Past Surgical History:  Procedure Laterality Date   AV FISTULA PLACEMENT Left 09/27/2014   INSERTION OF ARTERIOVENOUS (AV) ARTEGRAFT ARM Left 04/02/2018   Procedure: INSERTION OF ARTERIOVENOUS (AV) ARTEGRAFT ARM LEFT UPPER ARM;  Surgeon: Waynetta Sandy, MD;  Location: [place];  Service: Vascular;  Laterality: Left;   INSERTION OF DIALYSIS CATHETER N/A 04/02/2018   Procedure: INSERTION OF DIALYSIS CATHETER, right internal jugular;  Surgeon: Waynetta Sandy, MD;  Location: [place];  Service: Vascular;  Laterality: N/A;   NECK SURGERY     C6 & C7 replaced 30 yrs ago per pt   REVISON OF ARTERIOVENOUS FISTULA Left 04/02/2018   Procedure: REVISION PLICATION OF ARTERIOVENOUS FISTULA ARM;  Surgeon: Waynetta Sandy, MD;  Location: [place];  Service: Vascular;  Laterality: Left;   WISDOM TOOTH EXTRACTION       Home Medications:  Prior to Admission medications   Medication Sig Start Date End Date Taking? Authorizing Provider  allopurinol (ZYLOPRIM) 100 MG tablet Take 100 mg by mouth 2 (two) times daily.  10/04/14  Yes [provider]  amLODipine (NORVASC) 10 MG tablet Take 10 mg by mouth daily.   Yes [provider]  aspirin 81 MG tablet Take 81 mg by mouth daily. Reported on 01/02/2016   Yes [provider]  atorvastatin (LIPITOR) 40 MG tablet TAKE ONE TABLET BY MOUTH ONCE DAILY 03/12/16  Yes [name], MD  AURYXIA 1 GM 210 MG(Fe) tablet Take 420-840 mg by mouth See admin instructions. 840 mg with meals, and 420 mg with snacks 01/11/19  Yes [provider]  carvedilol (COREG) 25 MG tablet Take 1 tablet (25 mg total) by mouth 2 (two) times daily with a meal.  08/31/15  Yes [name], MD  cetirizine (ZYRTEC) 5 MG tablet Take 1 tablet (5 mg total) by mouth daily. Patient taking differently: Take 5 mg by mouth daily as needed for allergies.  10/21/18  Yes [name], MD  colchicine 0.6 MG tablet Take 0.6 mg by mouth 2 (two) times daily as needed (gout).  12/11/10  Yes [provider]  gabapentin (NEURONTIN) 300 MG capsule TAKE ONE CAPSULE BY MOUTH THREE TIMES DAILY Patient taking differently: Take 300 mg by mouth at bedtime.  09/30/16  Yes [name], MD  glipiZIDE (GLUCOTROL) 5 MG tablet Take 5 mg by mouth daily. 10/04/14  Yes [provider]  minoxidil (LONITEN) 10 MG tablet Take 2 tablets (20 mg total) by mouth 2 (two) times daily. 09/11/17  Yes Hensel, Jamal Collin, MD  multivitamin (RENA-VIT) TABS tablet Take 1 tablet by mouth at bedtime.   Yes [provider]  PRESCRIPTION MEDICATION Pt has CPAP machine  Yes [provider]  torsemide (DEMADEX) 100 MG tablet Take 100 mg by mouth See admin instructions. Take on Tuesday, Thursday, Saturday, and Sunday    Yes [provider]    Inpatient Medications: Scheduled Meds:  aspirin  81 mg Oral Daily   atorvastatin  40 mg Oral Daily   carvedilol  25 mg Oral BID WC   Chlorhexidine Gluconate Cloth  6 each Topical Q0600   [START ON 04/16/2019] darbepoetin (ARANESP) injection - NON-DIALYSIS  60 mcg Subcutaneous Q Fri-1800   dexamethasone  6 mg Oral Daily   gabapentin  300 mg Oral QHS   heparin injection (subcutaneous)  5,000 Units Subcutaneous Q8H   insulin aspart  0-9 Units Subcutaneous TID WC   linagliptin  5 mg Oral Daily   minoxidil  20 mg Oral BID   Continuous Infusions:  remdesivir 100 mg in NS 250 mL 100 mg (04/15/19 0925)   PRN Meds: acetaminophen  Allergies:   No Known Allergies  Social History:   Social History   Socioeconomic History   Marital status: Single    Spouse name: Not on file   Number of children: Not on  file   Years of education: Not on file   Highest education level: Not on file  Occupational History   Not on file  Social Needs   Financial resource strain: Not on file   Food insecurity    Worry: Not on file    Inability: Not on file   Transportation needs    Medical: Not on file    Non-medical: Not on file  Tobacco Use   Smoking status: Former Smoker    Packs/day: 1.00    Years: 10.00    Pack years: 10.00    Types: Cigars    Quit date: 06/18/2011    Years since quitting: 7.8   Smokeless tobacco: Never Used  Substance and Sexual Activity   Alcohol use: Yes    Alcohol/week: 1.0 standard drinks    Types: 1 Standard drinks or equivalent per week    Comment: beer or liquor occasionally   Drug use: No   Sexual activity: Yes  Lifestyle   Physical activity    Days per week: Not on file    Minutes per session: Not on file   Stress: Not on file  Relationships   Social connections    Talks on phone: Not on file    Gets together: Not on file    Attends religious service: Not on file    Active member of club or organization: Not on file    Attends meetings of clubs or organizations: Not on file    Relationship status: Not on file   Intimate partner violence    Fear of current or ex partner: Not on file    Emotionally abused: Not on file    Physically abused: Not on file    Forced sexual activity: Not on file  Other Topics Concern   Not on file  Social History Narrative   Updated 08/31/15 by Dr. Dossie Arbour    Currently Unemployed. On Disability.   Lives with daughter, grandson, and nephew    Family History:    Family History  Problem Relation Age of Onset   Hypertension Mother    Heart disease Mother 41       cause of death   Hypertension Father    Aneurysm Father 58       brain aneurysm   Heart disease Father 16  cause of death   Hypertension Brother    Alcohol abuse Brother    Diabetes Brother    Pancreatitis Brother     Hypertension Brother    Colon cancer Neg Hx      ROS:  Please see the history of present illness.   All other ROS reviewed and negative.     Physical Exam/Data:   Vitals:   04/15/19 1350 04/15/19 1400 04/15/19 1410 04/15/19 1430  BP: 102/60 110/60 (!) 92/42 (!) 99/38  Pulse:      Resp:      Temp: 97.7 F (36.5 C)     TempSrc: Oral     SpO2:      Weight: 122 kg     Height:        Intake/Output Summary (Last 24 hours) at 04/15/2019 1434 Last data filed at 04/15/2019 1000 Gross per 24 hour  Intake 740 ml  Output --  Net 740 ml   Filed Weights   04/13/19 1027 04/15/19 1350  Weight: 117.9 kg 122 kg   Body mass index is 36.48 kg/m.  Physical Exam per MD below  EKG:  The EKG was personally reviewed and demonstrates:  EKG with sinus rhythm with early repol, rate 86 bpm, QTc 490, LAE, LAFB, no STE/D Telemetry:  Reviewed by MD  Relevant CV Studies: Echocardiogram 09/2015: Study Conclusions  - Left ventricle: The cavity size was mildly dilated. There was   moderate concentric hypertrophy. Systolic function was normal.   The estimated ejection fraction was in the range of 60% to 65%.   Wall motion was normal; there were no regional wall motion   abnormalities. - Mitral valve: There was mild regurgitation. - Left atrium: The atrium was moderately to severely dilated. - Pulmonary arteries: Systolic pressure was mildly increased. PA   peak pressure: 38 mm Hg (S).  Impressions:  - Increased velocities are seen across all valves, likely   responsible for the reported murmur. Consider anemia,   thyrotoxicosis, AV fistula, etc. Consider high output cardiac   failure.  Laboratory Data:  Chemistry Recent Labs  Lab 04/13/19 1116 04/14/19 0200 04/14/19 0419 04/15/19 1115  NA 135 138 134* 136  K 4.1 4.3 4.7 4.7  CL 90* 91*  --  90*  CO2 26 27  --  23  GLUCOSE 95 86  --  170*  BUN 48* 64*  --  106*  CREATININE 13.63* 15.66*  --  18.59*  CALCIUM 9.0 8.7*  --   9.6  GFRNONAA 4* 3*  --  2*  GFRAA 4* 4*  --  3*  ANIONGAP 19* 20*  --  23*    Recent Labs  Lab 04/13/19 1116 04/14/19 0200  PROT 7.4 7.5  ALBUMIN 3.4* 3.3*  AST 46* 42*  ALT 65* 62*  ALKPHOS 44 52  BILITOT 1.0 1.1   Hematology Recent Labs  Lab 04/13/19 1116 04/14/19 0200 04/14/19 0419 04/15/19 1115  WBC 4.2 8.1  --  6.3  RBC 3.58* 3.59*  --  3.56*  HGB 10.1* 10.1* 10.2* 9.8*  HCT 29.4* 29.0* 30.0* 28.4*  MCV 82.1 80.8  --  79.8*  MCH 28.2 28.1  --  27.5  MCHC 34.4 34.8  --  34.5  RDW 14.6 14.5  --  14.3  PLT 199 216  --  235   Cardiac EnzymesNo results for input(s): TROPONINI in the last 168 hours. No results for input(s): TROPIPOC in the last 168 hours.  BNP Recent Labs  Lab  04/14/19 0200  BNP 1,317.6*    DDimer  Recent Labs  Lab 04/14/19 0200  DDIMER 1.67*    Radiology/Studies:  Dg Chest Portable 1 View  Result Date: 04/13/2019 CLINICAL DATA:  Shortness of breath, history of CHF and dialysis requirement. EXAM: PORTABLE CHEST 1 VIEW COMPARISON:  Radiograph 04/02/2018 FINDINGS: Widespread interstitial and airspace opacity throughout both lungs. Pulmonary vascularity is indistinct. The cardiac silhouette is lobular and enlarged though similar to comparison exams. No pneumothorax or visible effusion. No acute osseous or soft tissue abnormality. IMPRESSION: 1. Widespread interstitial and airspace opacity throughout both lungs, consistent with interstitial and alveolar pulmonary edema given history of CHF. Early infection could have a similar appearance in the appropriate clinical setting. 2. Unchanged enlargement of the cardiac silhouette, which could reflect cardiomegaly or pericardial effusion. Electronically Signed   By: Lovena Le M.D.   On: 04/13/2019 23:20    Assessment and Plan:   1. Elevated troponin in patient with non-obstructive CAD: patient presented with SOB/cough, found to have COVID-19. Trop elevated and trending down: [phone]. EKG  non-ischemic this admission. He has known CAD on [facility] in 2011 performed to evaluate NSTEMI which showed 60% LAD stenosis, 40% ostial stenosis, as well as aneurysmal segments throughout; no culprit lesion identified. He denies recent chest pain or DOE to suggest unstable angina. Suspect troponin elevation is 2/2 demand ischemia in the setting of COVID-19 and acute respiratory failure.  - Echo pending to assess LV function and wall motion - if normal, would consider outpatient ischemic evaluation with NST after resolution of present illness.  - Continue aspirin, atorvastatin, and carvedilol  2. Chronic diastolic CHF: EF 01-60% on last echo in 2017. Volume status is managed by HD given ESRD status. He has had orthopnea and SOB with present illness but no significant change in LE edema. BNP elevated to 1317 and CXR with pulmonary edema vs early infiltrates, felt to be more likely related to COVID-19 than CHF.  - Continue to monitor volume status closely - Continue volume removal per nephrology at HD - Continue carvedilol  3. Acute respiratory failure 2/2 COVID-19: found to be COVID-19 positive this admission. Hypoxic requiring HFNC. CXR with early infiltrates. Started on azithromycin and remdesivir. Improved SOB but still with cough and hemoptysis. Hgb stable - Continue management per primary team  4. HTN: BP on the soft side but stable. Home amlodipine on hold.  - Continue carvedilol - Resume amlodipine as BP will allow.   5. HLD: LDL 144 this admission; triglycerides 213.  - Will increase atorvastatin to 80mg  daily in an effort to reach goal LDL <70 given known CAD  6. DM type 2: A1C 5.8 this admission; at goal of <7 - Continue management per primary team  7. ESRD on HD: M/W/F schedule - Continue HD per nephrology   For questions or updates, please contact [place] Please consult [email or web address] for contact info under Cardiology/STEMI.   Signed, Abigail Butts, PA-C  04/15/2019  2:34 PM [phone]  History and all data above reviewed.  Patient examined.  I agree with the findings as above.   He presented with SOB.  His daughter was positive for COVID although later told it was a false positive.  He did not feel chest pain. He became SOB over the past few days.  Denies chest pain, neck or arm pain.    The patient exam reveals:    GENERAL:  Well appearing HEENT:  Pupils equal round and reactive, fundi not visualized, oral  mucosa unremarkable NECK:  No jugular venous distention, waveform within normal limits, carotid upstroke brisk and symmetric, no bruits, no thyromegaly LYMPHATICS:  No cervical, inguinal adenopathy LUNGS:  Clear to auscultation bilaterally BACK:  No CVA tenderness CHEST:  Unremarkable HEART:  PMI not displaced or sustained,S1 and S2 within normal limits, no S3, no S4, no clicks, no rubs, no murmurs ABD:  Flat, positive bowel sounds normal in frequency in pitch, no bruits, no rebound, no guarding, no midline pulsatile mass, no hepatomegaly, no splenomegaly EXT:  2 plus pulses throughout, no edema, no cyanosis no clubbing SKIN:  No rashes no nodules NEURO:  Cranial nerves II through XII grossly intact, motor grossly intact throughout PSYCH:  Cognitively intact, oriented to person place and time   Agree with documented assessment and plan. Positive hsTrop:  Elevated trop.  However, he does not report chest pain.  This elevation is likely demand ischemia.  Without objective evidence of ischemia I would not suspect that he will need further cardiac work up other than the echocardiogram.  I will consider out patient stress testing.  QT prolonged.  Avoid QT prolonging meds.  Check magnesium level.  Jeneen Rinks Michell Kader  4:34 PM  04/15/2019

## 2019-04-15 NOTE — Progress Notes (Signed)
Renal Navigator notes patient's positive COVID 19 test result. Patient is a dialysis patient, typically on a MWF schedule at [facility], however, he will now treat at [facility] isolation shift on TTS at 12:00pm upon discharge from the hospital. He needs to arrive to [facility] at 11:45am and wait in the parking lot to be called in to the clinic for treatments. Renal Navigator will discuss this with patient and ensure that he has transportation to treatments.  Alphonzo Cruise,  Renal Navigator [phone]

## 2019-04-15 NOTE — Progress Notes (Signed)
[facility] Progress Note  Subjective:  Patient not examined directly given COVID-19 + status, utilizing exam of the primary team and observations of RN's.    Vitals:   04/15/19 0332 04/15/19 0800 04/15/19 0900 04/15/19 1000  BP: 130/72 124/73    Pulse:  70 73 71  Resp:  17 13 (!) 24  Temp:  97.7 F (36.5 C)    TempSrc:  Oral    SpO2:  97% 92% 93%  Weight:      Height:        Inpatient medications: . aspirin  81 mg Oral Daily  . atorvastatin  40 mg Oral Daily  . carvedilol  25 mg Oral BID WC  . Chlorhexidine Gluconate Cloth  6 each Topical Q0600  . dexamethasone  6 mg Oral Daily  . gabapentin  300 mg Oral QHS  . heparin injection (subcutaneous)  5,000 Units Subcutaneous Q8H  . insulin aspart  0-9 Units Subcutaneous TID WC  . linagliptin  5 mg Oral Daily  . minoxidil  20 mg Oral BID   . remdesivir 100 mg in NS 250 mL 100 mg (04/15/19 0925)   acetaminophen    Exam:  Patient not examined directly given COVID-19 + status, utilizing exam of the primary team and observations of RN's.   Gen heavy set AAM, no distress, lying at 30deg on nasal O2 No rash, cyanosis or gangrene Sclera anicteric, throat clear  No jvd or bruits Chest scattered rales bilat post , no wheezing, no ^wob RRR no MRG Abd soft ntnd no mass or ascites +bs GU normal male MS no joint effusions or deformity Ext  Trace pretib edema, no wounds or ulcers Neuro is alert, Ox 3 , nf LUA AVF / AVG    Home meds:  - amlodipine 10/ carvedilol 25 bid/ minoxidil 20 bid/ torsemide 100mg  tts  - glipizide 5 qd  - gabapentin 300 hs  - allopurinol 100 bid  - aspirin 81/ atorvastatin 40 qd  - prn's/ vitamins/ supplements     Outpt HD: [facility] [place]   4.5h  450/800  121.5kg  2/2 bath P4  Hep 60000+ 2554midrun  LUA AVF + interposition Artegraft 03/2018  - last HD 10/26, under edw  - mircera 75 q2, last 10/16  - calc 1.5 ug tiw  - home sensipar 90 qd/ velphoro 2ac    Assessment/  Plan: 1. COVID+ PNA/ hypoxemia - per PMD 2. ESRD - HD MWF. HD today and then Saturday, then resume MWF  3. HTN - getting coreg / minoxidil at home dosing, as BP tolerates 4. Volume - left under dry wt last HD. Lower edw as tolerated here.  5. DM2 per primary 6. Anemia CKD - Hb 9.6, last esa 10/16 > due 10/30, written for darbe 60ug q Friday       Rob [place] 04/15/2019, 12:02 PM  Iron/TIBC/Ferritin/ %Sat No results found for: IRON, TIBC, FERRITIN, IRONPCTSAT Recent Labs  Lab 04/14/19 0200 04/14/19 0419  NA 138 134*  K 4.3 4.7  CL 91*  --   CO2 27  --   GLUCOSE 86  --   BUN 64*  --   CREATININE 15.66*  --   CALCIUM 8.7*  --   ALBUMIN 3.3*  --   INR 1.1  --    Recent Labs  Lab 04/14/19 0200  AST 42*  ALT 62*  ALKPHOS 52  BILITOT 1.1  PROT 7.5   Recent Labs  Lab 04/15/19 1115  WBC  6.3  HGB 9.8*  HCT 28.4*  PLT 235

## 2019-04-15 NOTE — CV Procedure (Signed)
2D echo attempted, but patient on active dialysis. RN requests we come back after 530 or another time.

## 2019-04-15 NOTE — Progress Notes (Signed)
PCCM telephone follow-up  Discussed with [place] resident. Patient is doing well and O2 requirements are stable. Continue current treatment.  Would not recommend trending CRP unless patient worsening.  Will follow up in person tomorrow.  Kipp Brood, MD [facility] ICU Physician [place]  Pager: [phone] Mobile: [phone] After hours: [phone].  04/15/2019, 6:37 PM

## 2019-04-15 NOTE — Plan of Care (Signed)

## 2019-04-15 NOTE — Progress Notes (Signed)
[facility] PCP SOCIAL NOTE Troy Wells I called [name] to check on him. He said that he is breathing better but still coughing. His hemoptysis remains same since admission. I advised him to talk to the inpatient team regarding his hemoptysis to determine when it is appropriate to switch his DVT prophylaxis from pharmacologic agent (Heparin) to mechanical agent (SCD).  I will defer management to the team. Patient is otherwise doing well.

## 2019-04-16 ENCOUNTER — Inpatient Hospital Stay (HOSPITAL_COMMUNITY): Payer: Medicare Other

## 2019-04-16 DIAGNOSIS — E1122 Type 2 diabetes mellitus with diabetic chronic kidney disease: Secondary | ICD-10-CM | POA: Diagnosis not present

## 2019-04-16 DIAGNOSIS — Z992 Dependence on renal dialysis: Secondary | ICD-10-CM

## 2019-04-16 DIAGNOSIS — E1159 Type 2 diabetes mellitus with other circulatory complications: Secondary | ICD-10-CM

## 2019-04-16 DIAGNOSIS — I340 Nonrheumatic mitral (valve) insufficiency: Secondary | ICD-10-CM | POA: Diagnosis not present

## 2019-04-16 DIAGNOSIS — J9601 Acute respiratory failure with hypoxia: Secondary | ICD-10-CM | POA: Diagnosis not present

## 2019-04-16 DIAGNOSIS — N186 End stage renal disease: Secondary | ICD-10-CM | POA: Diagnosis not present

## 2019-04-16 DIAGNOSIS — R9431 Abnormal electrocardiogram [ECG] [EKG]: Secondary | ICD-10-CM | POA: Clinically undetermined

## 2019-04-16 DIAGNOSIS — U071 COVID-19: Secondary | ICD-10-CM | POA: Diagnosis not present

## 2019-04-16 DIAGNOSIS — R7989 Other specified abnormal findings of blood chemistry: Secondary | ICD-10-CM | POA: Diagnosis not present

## 2019-04-16 DIAGNOSIS — G4733 Obstructive sleep apnea (adult) (pediatric): Secondary | ICD-10-CM

## 2019-04-16 DIAGNOSIS — J22 Unspecified acute lower respiratory infection: Secondary | ICD-10-CM | POA: Diagnosis not present

## 2019-04-16 DIAGNOSIS — I10 Essential (primary) hypertension: Secondary | ICD-10-CM

## 2019-04-16 DIAGNOSIS — I272 Pulmonary hypertension, unspecified: Secondary | ICD-10-CM

## 2019-04-16 LAB — CBC
HCT: 25.7 % — ABNORMAL LOW (ref 39.0–52.0)
Hemoglobin: 8.9 g/dL — ABNORMAL LOW (ref 13.0–17.0)
MCH: 27.6 pg (ref 26.0–34.0)
MCHC: 34.6 g/dL (ref 30.0–36.0)
MCV: 79.6 fL — ABNORMAL LOW (ref 80.0–100.0)
Platelets: 237 10*3/uL (ref 150–400)
RBC: 3.23 MIL/uL — ABNORMAL LOW (ref 4.22–5.81)
RDW: 14.4 % (ref 11.5–15.5)
WBC: 6.5 10*3/uL (ref 4.0–10.5)
nRBC: 0 % (ref 0.0–0.2)

## 2019-04-16 LAB — GLUCOSE, CAPILLARY
Glucose-Capillary: 117 mg/dL — ABNORMAL HIGH (ref 70–99)
Glucose-Capillary: 144 mg/dL — ABNORMAL HIGH (ref 70–99)
Glucose-Capillary: 121 mg/dL — ABNORMAL HIGH (ref 70–99)
Glucose-Capillary: 204 mg/dL — ABNORMAL HIGH (ref 70–99)

## 2019-04-16 LAB — BASIC METABOLIC PANEL
Anion gap: 21 — ABNORMAL HIGH (ref 5–15)
BUN: 71 mg/dL — ABNORMAL HIGH (ref 6–20)
CO2: 20 mmol/L — ABNORMAL LOW (ref 22–32)
Calcium: 8.9 mg/dL (ref 8.9–10.3)
Chloride: 99 mmol/L (ref 98–111)
Creatinine, Ser: 12.44 mg/dL — ABNORMAL HIGH (ref 0.61–1.24)
GFR calc Af Amer: 5 mL/min — ABNORMAL LOW (ref >60)
GFR calc non Af Amer: 4 mL/min — ABNORMAL LOW (ref >60)
Glucose, Bld: 133 mg/dL — ABNORMAL HIGH (ref 70–99)
Potassium: 5 mmol/L (ref 3.5–5.1)
Sodium: 140 mmol/L (ref 135–145)

## 2019-04-16 LAB — ECHOCARDIOGRAM COMPLETE
Height: 72 in
Weight: 4225.78 oz

## 2019-04-16 LAB — MAGNESIUM: Magnesium: 2.2 mg/dL (ref 1.7–2.4)

## 2019-04-16 MED ORDER — CHLORHEXIDINE GLUCONATE CLOTH 2 % EX PADS
6.0000 | MEDICATED_PAD | Freq: Every day | CUTANEOUS | Status: DC
  Administered 2019-04-18: 6 via TOPICAL

## 2019-04-16 NOTE — Progress Notes (Signed)
Family Medicine Teaching Service Daily Progress Note Intern Pager: [phone]  Patient name: Troy Wells Medical record number: [id number] Date of birth: 10/04/1965 Age: 53 y.o. Gender: male  Primary Care Provider: [name], MD Consultants: Nephrology Code Status: Full  Pt Overview and Major Events to Date:  10/28-admission for Covid positive  Assessment and Plan: [name] is a 53 y.o. male presenting with SOB, chills and diarrhea for 3 days, patient also noted to have recent COVID-19 exposure. PMH is significant for ESRD MWF HD, OSA, HTN, HLD, CHFpEF, pHTN, DM, diabetic neuropathy.    Acute Hypoxic Respiratory Failure likely 2/2 COVID  Vital signs stable overnight and continues to be afebrile.  Respiration rate 18-21.  Oxygen demand decreased overnight, currently on high flow nasal cannula 2 L and oxygen saturation 95-97%.  Per CCM no need to trend CRP.  Patient tolerating remdesivir and Decadron.  On exam patient well appearing.  No increase work of breathing. -CCM following -Monitor respiration status closely -Titrate oxygen to maintain saturation greater than 92%. -Continuous cardiac and pulse ox monitoring -Continue remdesivir (10/28-) -Continue Decadron 6 mg (10/27-) -CBC in a.m.  ESRD on HD MWF-dry weight 112.2 kg Vital signs stable.  Dialysis yesterday for 1.8 L.  Net -1.3 L.  No recorded urine output.  Patient is oliguric.  Weight on admission 117.9 kg.  Current weight 119.8 kg, down 2.2 pounds since yesterday.  Weight most likely due to bed air.On exam patient appears euvolemic. No increased work of breathing. -Nephro following, dialyze again today then back to regular schedule. -Dialysis Mondays Wednesdays and Fridays. -Continue Aranesp per nephrology -BMP in a.m. -Intake and output -Daily weights   Anemia of chronic disease. Hemoglobin 8.9 today.  MCV 79.6.  Dialyzed yesterday.  Most likely related to end-stage renal disease -Nephrology  following -Patient Aranesp 69mcg weekly.  CHFpEF 60-65% as of 2017  CAD Asymptomatic.  Patient appears euvolemic.  Lung sounds clear.  Troponins elevated. -Dialysis Saturday then back to M/W/F -F/u echo -Continue Coreg and aspirin -Cardiology consult -Follow-up echo  Hypertension Normotensive overnight. -Continue carvedilol 25 mg twice daily -Continue minoxidil 20 mg twice daily  OSA with use of CPAP  Patient uses CPAP at night.  In light of positive Covid unable to use wall in hospital.  HLD -Continue atorvastatin  Diabetes CBG overnight 195.  Required 3 units insulin coverage yesterday. -Continue Tradjenta 5 mg daily -Sensitive scale insulin coverage with meals -Monitor CBGs   Diabetic Neuropathy  -Continue gabapentin 300 3 times daily  Gout  -Asymptomatic   FEN/GI:  -Heart healthy  Prophylaxis:  -Heparin 5000 units every 8    Disposition: Home when medically stable  Subjective:  No acute events overnight.  Oxygen demand decreased to 2 L high flow nasal cannula.  Dialyzed yesterday. Denies any chest pain, SOB.  Objective: Temp:  [97.7 F (36.5 C)-98 F (36.7 C)] 97.9 F (36.6 C) (10/30 0759) Pulse Rate:  [70-90] 72 (10/30 0759) Resp:  [18-20] 20 (10/30 0759) BP: (94-127)/(52-72) 122/72 (10/30 0759) SpO2:  [94 %-95 %] 95 % (10/29 2342) Weight:  [119.8 kg] 119.8 kg (10/29 1730) Physical Exam: General: 53 year old gentleman, in no acute distress Cardiovascular: Regular rate and rhythm, no murmurs appreciated Respiratory: Clear to auscultation bilaterally, no crackles, no rhonchi, no increased work of breathing Abdomen: Soft, nontender, nondistended, bowel sounds present, no organomegaly Extremities: Moving all extremities, no lower extremity edema.  Laboratory: Recent Labs  Lab 04/14/19 0200 04/14/19 0419 04/15/19 1115 04/16/19 0418  WBC 8.1  --  6.3 6.5  HGB 10.1* 10.2* 9.8* 8.9*  HCT 29.0* 30.0* 28.4* 25.7*  PLT 216  --  235 237   Recent  Labs  Lab 04/13/19 1116 04/14/19 0200 04/14/19 0419 04/15/19 1115 04/16/19 0418  NA 135 138 134* 136 140  K 4.1 4.3 4.7 4.7 5.0  CL 90* 91*  --  90* 99  CO2 26 27  --  23 20*  BUN 48* 64*  --  106* 71*  CREATININE 13.63* 15.66*  --  18.59* 12.44*  CALCIUM 9.0 8.7*  --  9.6 8.9  PROT 7.4 7.5  --   --   --   BILITOT 1.0 1.1  --   --   --   ALKPHOS 44 52  --   --   --   ALT 65* 62*  --   --   --   AST 46* 42*  --   --   --   GLUCOSE 95 86  --  170* 133*      Imaging/Diagnostic Tests:   Carollee Leitz, MD 04/16/2019, 3:35 PM PGY-1, [facility] Intern pager: [phone], text pages welcome

## 2019-04-16 NOTE — Progress Notes (Signed)
Progress Note  Patient Name: Troy Wells Date of Encounter: 04/16/2019  Primary Cardiologist:   No primary care provider on file.   Subjective   Breathing better today.  No chest pain.  Feels good.    Inpatient Medications    Scheduled Meds: . aspirin  81 mg Oral Daily  . atorvastatin  80 mg Oral Daily  . carvedilol  25 mg Oral BID WC  . Chlorhexidine Gluconate Cloth  6 each Topical Q0600  . darbepoetin (ARANESP) injection - NON-DIALYSIS  60 mcg Subcutaneous Q Fri-1800  . dexamethasone  6 mg Oral Daily  . gabapentin  300 mg Oral QHS  . heparin injection (subcutaneous)  5,000 Units Subcutaneous Q8H  . insulin aspart  0-9 Units Subcutaneous TID WC  . linagliptin  5 mg Oral Daily  . minoxidil  20 mg Oral BID   Continuous Infusions: . remdesivir 100 mg in NS 250 mL 100 mg (04/16/19 1030)   PRN Meds: acetaminophen   Vital Signs    Vitals:   04/15/19 1730 04/15/19 1746 04/15/19 2342 04/16/19 0759  BP: 114/68 121/68 127/66 122/72  Pulse:   90 72  Resp: 20  18 20   Temp: 97.7 F (36.5 C) 97.7 F (36.5 C) 98 F (36.7 C) 97.9 F (36.6 C)  TempSrc: Oral Oral Oral Oral  SpO2:  94% 95%   Weight: 119.8 kg     Height:        Intake/Output Summary (Last 24 hours) at 04/16/2019 1217 Last data filed at 04/16/2019 1207 Gross per 24 hour  Intake 450 ml  Output 1800 ml  Net -1350 ml   Filed Weights   04/13/19 1027 04/15/19 1350 04/15/19 1730  Weight: 117.9 kg 122 kg 119.8 kg    Telemetry    NSR - Personally Reviewed  ECG    NA - Personally Reviewed  Physical Exam   NA  Labs    Chemistry Recent Labs  Lab 04/13/19 1116 04/14/19 0200 04/14/19 0419 04/15/19 1115 04/16/19 0418  NA 135 138 134* 136 140  K 4.1 4.3 4.7 4.7 5.0  CL 90* 91*  --  90* 99  CO2 26 27  --  23 20*  GLUCOSE 95 86  --  170* 133*  BUN 48* 64*  --  106* 71*  CREATININE 13.63* 15.66*  --  18.59* 12.44*  CALCIUM 9.0 8.7*  --  9.6 8.9  PROT 7.4 7.5  --   --   --   ALBUMIN  3.4* 3.3*  --   --   --   AST 46* 42*  --   --   --   ALT 65* 62*  --   --   --   ALKPHOS 44 52  --   --   --   BILITOT 1.0 1.1  --   --   --   GFRNONAA 4* 3*  --  2* 4*  GFRAA 4* 4*  --  3* 5*  ANIONGAP 19* 20*  --  23* 21*     Hematology Recent Labs  Lab 04/14/19 0200 04/14/19 0419 04/15/19 1115 04/16/19 0418  WBC 8.1  --  6.3 6.5  RBC 3.59*  --  3.56* 3.23*  HGB 10.1* 10.2* 9.8* 8.9*  HCT 29.0* 30.0* 28.4* 25.7*  MCV 80.8  --  79.8* 79.6*  MCH 28.1  --  27.5 27.6  MCHC 34.8  --  34.5 34.6  RDW 14.5  --  14.3 14.4  PLT 216  --  235 237    Cardiac EnzymesNo results for input(s): TROPONINI in the last 168 hours. No results for input(s): TROPIPOC in the last 168 hours.   BNP Recent Labs  Lab 04/14/19 0200  BNP 1,317.6*     DDimer  Recent Labs  Lab 04/14/19 0200  DDIMER 1.67*     Radiology    No results found.  Cardiac Studies   ECHO: Pending  Patient Profile     53 y.o. male  with a PMH of non-obstructive CAD on LHC in 1610, chronic diastolic CHF, pulmonary HTN, HTN, HLD, OSA on CPAP, DM type 2 with neuropathy, and ESRD on HD M/W/F, who is being seen for the evaluation of elevated troponins at the request of Dr. Andria Frames.  Assessment & Plan    ELEVATED TROP:  No pain.  Echo pending.  Ischemia work up pending the results of the echo and resolution of his acute illness.  He has no chest pain.    CHRONIC DIASTOLIC HF:  Volume management per dialysis.    For questions or updates, please contact [place] Please consult [email or web address] for contact info under Cardiology/STEMI.   Signed, Minus Breeding, MD  04/16/2019, 12:17 PM

## 2019-04-16 NOTE — Progress Notes (Addendum)
NAME:  Troy Wells, MRN:  [id number], DOB:  1966/02/21, LOS: 2 ADMISSION DATE:  04/13/2019, CONSULTATION DATE:  04/14/2019 REFERRING MD:  Rosita Fire CHIEF COMPLAINT:  COVID 19 pneumonia, SOB  Brief History   53yo M PMH ESRD on HD, Diastolic CHF, DM2, OSA on BiPAP, MI, admitted for acute respiratory failure 2/2 COVID 19 pneumonia. PCCM consulted for recommendations regarding Remdesivir therapy.   History of present illness   53yo M PMH ESRD on HD, Diastolic CHF, DM2, HTN, OSA on BiPAP, MI, anemia of chronic kidney disease presented to ED 10/27 c/oo SOB x 3 days, associated with productive cough of blood streaked sputum, generalized body aches, chills and sweats, no fever, diarrhea 2 days ago which is since resolved, no associated nausea or vomiting, no loss of sense of smell or taste.  He has a known exposure to COVID-19 as his daughter tested +2 weeks ago.  Last dialysis was yesterday and shortness of breath did not improve with this therefore prompting visit to the emergency department.  In the ED he had a low-grade temperature of 99.7 F, heart rate in the 80s to 90s, stable blood pressure, increasing tachypnea, SPO2 86% on room air, placed on 2 L/min, subsequently dropped throughout his stay in the emergency department requiring nonrebreather then high flow nasal cannula.  Chest x-ray showed widespread interstitial and airspace opacity throughout both lungs consistent with edema. COVID-19 positive. Elevated troponin, subsequently downtrending.  No chest pain or EKG changes. He was admitted to family medicine teaching services, continued on dexamethasone, supplemental oxygen, azithromycin which was subsequently discontinued.  CCM was consulted for recommendations regarding initiation of remdesivir.  Past Medical History  End-stage renal disease on HD OSA on BiPAP Hypertension Diabetes mellitus Diastolic congestive heart failure Echo 09/18/2015 EF 60 to 65% with mildly increased PA peak  pressure 38 mmHg.  Negative dobutamine stress echo at [facility] on 09/04/2017 MI Anemia of chronic kidney disease Significant Hospital Events   10/28 admitted  Consults:  Nephrology CCM  Procedures:  NA  Significant Diagnostic Tests:  10/28 CXR bilateral widespread interstitial edema and peripheral opacities  Micro Data:  SARS Coronavirus 2 POSITIVE    Antimicrobials:  Azithromycin 10/27 x 1 dose Ceftriaxone 10/27x1 dose Remdesivir 10/28>>  Interim history/subjective:  Patient denies significant improvement following dialysis although is now down from 10 L initially to 2 L only.  He denies any significant dyspnea.  Objective   Blood pressure 122/72, pulse 72, temperature 97.9 F (36.6 C), temperature source Oral, resp. rate 20, height 6' (1.829 m), weight 119.8 kg, SpO2 95 %.        Intake/Output Summary (Last 24 hours) at 04/16/2019 1144 Last data filed at 04/15/2019 1730 Gross per 24 hour  Intake -  Output 1800 ml  Net -1800 ml   Filed Weights   04/13/19 1027 04/15/19 1350 04/15/19 1730  Weight: 117.9 kg 122 kg 119.8 kg    Examination: Physical Exam Constitutional:      Appearance: He is well-developed.  HENT:     Mouth/Throat:     Pharynx: Oropharynx is clear.  Cardiovascular:     Rate and Rhythm: Normal rate and regular rhythm.     Heart sounds: Normal heart sounds.  Pulmonary:     Effort: No tachypnea.     Breath sounds: Normal breath sounds.     Comments: Crackles have largely cleared Abdominal:     General: Bowel sounds are normal.  Musculoskeletal:     Right lower leg: No  edema.     Left lower leg: No edema.  Neurological:     General: No focal deficit present.     Mental Status: He is alert.     Resolved Hospital Problem list   NA  Assessment & Plan:  This is a 53 year old male with history as outlined above admitted for acute hypoxic respiratory failure secondary to COVID-19 associated pneumonia.   He is improving with  current treatment and would recommend completing 5-day course of remdesivir and 10-day course of dexamethasone, the latter can be given orally.  Given his significant improvement, we will sign off at this time.  Please reconsult if further questions arise.    Labs   CBC: Recent Labs  Lab 04/13/19 1116 04/14/19 0200 04/14/19 0419 04/15/19 1115 04/16/19 0418  WBC 4.2 8.1  --  6.3 6.5  HGB 10.1* 10.1* 10.2* 9.8* 8.9*  HCT 29.4* 29.0* 30.0* 28.4* 25.7*  MCV 82.1 80.8  --  79.8* 79.6*  PLT 199 216  --  235 400    Basic Metabolic Panel: Recent Labs  Lab 04/13/19 1116 04/14/19 0200 04/14/19 0419 04/15/19 1115 04/16/19 0418  NA 135 138 134* 136 140  K 4.1 4.3 4.7 4.7 5.0  CL 90* 91*  --  90* 99  CO2 26 27  --  23 20*  GLUCOSE 95 86  --  170* 133*  BUN 48* 64*  --  106* 71*  CREATININE 13.63* 15.66*  --  18.59* 12.44*  CALCIUM 9.0 8.7*  --  9.6 8.9  MG  --   --   --   --  2.2   GFR: Estimated Creatinine Clearance: 9.1 mL/min (A) (by C-G formula based on SCr of 12.44 mg/dL (H)). Recent Labs  Lab 04/13/19 1116 04/14/19 0200 04/15/19 1115 04/16/19 0418  WBC 4.2 8.1 6.3 6.5    Liver Function Tests: Recent Labs  Lab 04/13/19 1116 04/14/19 0200  AST 46* 42*  ALT 65* 62*  ALKPHOS 44 52  BILITOT 1.0 1.1  PROT 7.4 7.5  ALBUMIN 3.4* 3.3*   Recent Labs  Lab 04/13/19 1116  LIPASE 37   No results for input(s): AMMONIA in the last 168 hours.  ABG    Component Value Date/Time   PHART 7.484 (H) 04/14/2019 0419   PCO2ART 39.0 04/14/2019 0419   PO2ART 80.0 (L) 04/14/2019 0419   HCO3 29.2 (H) 04/14/2019 0419   TCO2 30 04/14/2019 0419   O2SAT 96.0 04/14/2019 0419     Coagulation Profile: Recent Labs  Lab 04/14/19 0200  INR 1.1    Cardiac Enzymes: Recent Labs  Lab 04/14/19 0200  CKTOTAL 371    HbA1C: Hemoglobin-A1c  Date/Time Value Ref Range Status  09/14/2009 09:43 AM 8.5 (H) 5.4 - 7.4 % Final   Hemoglobin A1C  Date/Time Value Ref Range Status   11/21/2016 02:33 PM 5.3  Final  04/02/2016 10:05 AM 6.1  Final   HbA1c, POC (controlled diabetic range)  Date/Time Value Ref Range Status  10/21/2018 11:25 AM 5.7 0.0 - 7.0 % Final   Hgb A1c MFr Bld  Date/Time Value Ref Range Status  04/14/2019 02:00 AM 5.8 (H) 4.8 - 5.6 % Final    Comment:    (NOTE) Pre diabetes:          5.7%-6.4% Diabetes:              >6.4% Glycemic control for   <7.0% adults with diabetes     CBG: Recent Labs  Lab 04/15/19  0820 04/15/19 1226 04/15/19 1657 04/15/19 2129 04/16/19 [place], [place] ICU Physician [place]  Pager: [phone] Mobile: [phone] After hours: [phone].  04/16/2019, 11:46 AM

## 2019-04-16 NOTE — Progress Notes (Signed)
Troy Wells Progress Note  Subjective:  Patient seen in room, feeling good/ better, no new c/o. occ mild cough.    Vitals:   04/15/19 1730 04/15/19 1746 04/15/19 2342 04/16/19 0759  BP: 114/68 121/68 127/66 122/72  Pulse:   90 72  Resp: 20  18 20   Temp: 97.7 F (36.5 C) 97.7 F (36.5 C) 98 F (36.7 C) 97.9 F (36.6 C)  TempSrc: Oral Oral Oral Oral  SpO2:  94% 95%   Weight: 119.8 kg     Height:        Inpatient medications: . aspirin  81 mg Oral Daily  . atorvastatin  80 mg Oral Daily  . carvedilol  25 mg Oral BID WC  . Chlorhexidine Gluconate Cloth  6 each Topical Q0600  . darbepoetin (ARANESP) injection - NON-DIALYSIS  60 mcg Subcutaneous Q Fri-1800  . dexamethasone  6 mg Oral Daily  . gabapentin  300 mg Oral QHS  . heparin injection (subcutaneous)  5,000 Units Subcutaneous Q8H  . insulin aspart  0-9 Units Subcutaneous TID WC  . linagliptin  5 mg Oral Daily  . minoxidil  20 mg Oral BID   . remdesivir 100 mg in NS 250 mL 100 mg (04/16/19 1030)   acetaminophen    Exam:  Patient not examined directly given COVID-19 + status, utilizing exam of the primary team and observations of RN's.   Gen heavy set AAM, no distress, lying at 30deg on nasal O2 No rash, cyanosis or gangrene Sclera anicteric, throat clear  No jvd or bruits Chest scattered rales bilat post , no wheezing, no ^wob RRR no MRG Abd soft ntnd no mass or ascites +bs GU normal male MS no joint effusions or deformity Ext  Trace pretib edema, no wounds or ulcers Neuro is alert, Ox 3 , nf LUA AVF / AVG    Home meds:  - amlodipine 10/ carvedilol 25 bid/ minoxidil 20 bid/ torsemide 100mg  tts  - glipizide 5 qd  - gabapentin 300 hs  - allopurinol 100 bid  - aspirin 81/ atorvastatin 40 qd  - prn's/ vitamins/ supplements     Outpt HD: [facility]   4.5h  450/800  121.5kg  2/2 bath P4  Hep 60000+ 2570midrun  LUA AVF + interposition Artegraft 03/2018  - last HD 10/26, under edw  -  mircera 75 q2, last 10/16  - calc 1.5 ug tiw  - home sensipar 90 qd/ velphoro 2ac    Assessment/ Plan: 1. COVID+ PNA/ hypoxemia - per PMD 2. ESRD - HD MWF. Next HD Saturday. Will keep on TTS schedule since OP COVID shift is TTS.  3. HTN - getting coreg / minoxidil at home dosing 4. Volume - 2kg under prior dry wt. No vol ^ on exam. Min UF on HD Sat 5. DM2 per primary 6. Anemia CKD - Hb 9.6, last esa 10/16 > due 10/30, written for darbe 60ug q Friday       Troy Wells 04/16/2019, 12:51 PM  Iron/TIBC/Ferritin/ %Sat No results found for: IRON, TIBC, FERRITIN, IRONPCTSAT Recent Labs  Lab 04/14/19 0200  04/16/19 0418  NA 138   < > 140  K 4.3   < > 5.0  CL 91*   < > 99  CO2 27   < > 20*  GLUCOSE 86   < > 133*  BUN 64*   < > 71*  CREATININE 15.66*   < > 12.44*  CALCIUM 8.7*   < > 8.9  ALBUMIN 3.3*  --   --   INR 1.1  --   --    < > = values in this interval not displayed.   Recent Labs  Lab 04/14/19 0200  AST 42*  ALT 62*  ALKPHOS 52  BILITOT 1.1  PROT 7.5   Recent Labs  Lab 04/16/19 0418  WBC 6.5  HGB 8.9*  HCT 25.7*  PLT 237

## 2019-04-16 NOTE — Progress Notes (Signed)
  Echocardiogram 2D Echocardiogram has been performed.  Troy Wells R 04/16/2019, 9:08 AM

## 2019-04-16 NOTE — Progress Notes (Signed)
Renal Navigator attempted to call patient to inform him of isolation shift schedule at OP HD clinic at discharge, but his room phone was busy. Navigator will try again at a later time.  Alphonzo Cruise, [place] Renal Navigator [phone]

## 2019-04-16 NOTE — Progress Notes (Signed)
Daily Nursing Note  Assumed care for patient at [facility] from [place], [place]. Patient noted to be on 2LPM HFNC. Spoke with medicine team who asked if it would be possible to wean the patient further. Trialed on RA though desaturated to 85-87%. Place on 1LPM regular [place] with response of SPO2 in the low 90's. Patient reports feeling well throughout this time. All patient needs met.

## 2019-04-17 DIAGNOSIS — R042 Hemoptysis: Secondary | ICD-10-CM

## 2019-04-17 DIAGNOSIS — I214 Non-ST elevation (NSTEMI) myocardial infarction: Secondary | ICD-10-CM | POA: Diagnosis not present

## 2019-04-17 DIAGNOSIS — J9601 Acute respiratory failure with hypoxia: Secondary | ICD-10-CM | POA: Diagnosis not present

## 2019-04-17 DIAGNOSIS — D631 Anemia in chronic kidney disease: Secondary | ICD-10-CM

## 2019-04-17 DIAGNOSIS — J22 Unspecified acute lower respiratory infection: Secondary | ICD-10-CM | POA: Diagnosis not present

## 2019-04-17 DIAGNOSIS — N189 Chronic kidney disease, unspecified: Secondary | ICD-10-CM

## 2019-04-17 DIAGNOSIS — U071 COVID-19: Secondary | ICD-10-CM | POA: Diagnosis not present

## 2019-04-17 LAB — CBC
HCT: 28 % — ABNORMAL LOW (ref 39.0–52.0)
Hemoglobin: 9.5 g/dL — ABNORMAL LOW (ref 13.0–17.0)
MCH: 27.1 pg (ref 26.0–34.0)
MCHC: 33.9 g/dL (ref 30.0–36.0)
MCV: 79.8 fL — ABNORMAL LOW (ref 80.0–100.0)
Platelets: 266 10*3/uL (ref 150–400)
RBC: 3.51 MIL/uL — ABNORMAL LOW (ref 4.22–5.81)
RDW: 14.6 % (ref 11.5–15.5)
WBC: 4.6 10*3/uL (ref 4.0–10.5)
nRBC: 0 % (ref 0.0–0.2)

## 2019-04-17 LAB — COMPREHENSIVE METABOLIC PANEL
ALT: 90 U/L — ABNORMAL HIGH (ref 0–44)
AST: 53 U/L — ABNORMAL HIGH (ref 15–41)
Albumin: 3.2 g/dL — ABNORMAL LOW (ref 3.5–5.0)
Alkaline Phosphatase: 47 U/L (ref 38–126)
Anion gap: 21 — ABNORMAL HIGH (ref 5–15)
BUN: 106 mg/dL — ABNORMAL HIGH (ref 6–20)
CO2: 20 mmol/L — ABNORMAL LOW (ref 22–32)
Calcium: 9.1 mg/dL (ref 8.9–10.3)
Chloride: 96 mmol/L — ABNORMAL LOW (ref 98–111)
Creatinine, Ser: 14.79 mg/dL — ABNORMAL HIGH (ref 0.61–1.24)
GFR calc Af Amer: 4 mL/min — ABNORMAL LOW (ref >60)
GFR calc non Af Amer: 3 mL/min — ABNORMAL LOW (ref >60)
Glucose, Bld: 150 mg/dL — ABNORMAL HIGH (ref 70–99)
Potassium: 5.3 mmol/L — ABNORMAL HIGH (ref 3.5–5.1)
Sodium: 137 mmol/L (ref 135–145)
Total Bilirubin: 0.9 mg/dL (ref 0.3–1.2)
Total Protein: 6.8 g/dL (ref 6.5–8.1)

## 2019-04-17 LAB — GLUCOSE, CAPILLARY
Glucose-Capillary: 137 mg/dL — ABNORMAL HIGH (ref 70–99)
Glucose-Capillary: 207 mg/dL — ABNORMAL HIGH (ref 70–99)
Glucose-Capillary: 196 mg/dL — ABNORMAL HIGH (ref 70–99)
Glucose-Capillary: 229 mg/dL — ABNORMAL HIGH (ref 70–99)

## 2019-04-17 LAB — HEPATITIS C ANTIBODY: HCV Ab: NONREACTIVE

## 2019-04-17 MED ORDER — HEPARIN SODIUM (PORCINE) 1000 UNIT/ML IJ SOLN
INTRAMUSCULAR | Status: AC
  Administered 2019-04-17: 6000 [IU] via INTRAVENOUS_CENTRAL
  Filled 2019-04-17: qty 6

## 2019-04-17 MED ORDER — HEPARIN SODIUM (PORCINE) 1000 UNIT/ML DIALYSIS
6000.0000 [IU] | Freq: Once | INTRAMUSCULAR | Status: AC
  Administered 2019-04-17: 13:00:00 6000 [IU] via INTRAVENOUS_CENTRAL

## 2019-04-17 MED ORDER — DARBEPOETIN ALFA 60 MCG/0.3ML IJ SOSY
PREFILLED_SYRINGE | INTRAMUSCULAR | Status: AC
  Administered 2019-04-17: 60 ug via SUBCUTANEOUS
  Filled 2019-04-17: qty 0.3

## 2019-04-17 MED ORDER — DARBEPOETIN ALFA 60 MCG/0.3ML IJ SOSY
60.0000 ug | PREFILLED_SYRINGE | Freq: Once | INTRAMUSCULAR | Status: AC
  Administered 2019-04-17: 15:00:00 60 ug via SUBCUTANEOUS
  Filled 2019-04-17: qty 0.3

## 2019-04-17 NOTE — Progress Notes (Signed)
Progress Note  Patient Name: Troy Wells Date of Encounter: 04/17/2019  Primary Cardiologist:   No primary care provider on file.   Subjective   Denies any chest pain or dyspnea today  Inpatient Medications    Scheduled Meds: . aspirin  81 mg Oral Daily  . atorvastatin  80 mg Oral Daily  . carvedilol  25 mg Oral BID WC  . Chlorhexidine Gluconate Cloth  6 each Topical Q0600  . Chlorhexidine Gluconate Cloth  6 each Topical Q0600  . Darbepoetin Alfa      . darbepoetin (ARANESP) injection - NON-DIALYSIS  60 mcg Subcutaneous Q Fri-1800  . dexamethasone  6 mg Oral Daily  . gabapentin  300 mg Oral QHS  . heparin injection (subcutaneous)  5,000 Units Subcutaneous Q8H  . insulin aspart  0-9 Units Subcutaneous TID WC  . linagliptin  5 mg Oral Daily  . minoxidil  20 mg Oral BID   Continuous Infusions: . remdesivir 100 mg in NS 250 mL 100 mg (04/17/19 0942)   PRN Meds: acetaminophen   Vital Signs    Vitals:   04/17/19 1220 04/17/19 1231 04/17/19 1236 04/17/19 1245  BP: 129/67 130/68 (!) 121/44 122/64  Pulse: 64 66 60 68  Resp: 16  18   Temp: 97.7 F (36.5 C)     TempSrc: Oral     SpO2: 98%     Weight: 125.4 kg     Height:       No intake or output data in the 24 hours ending 04/17/19 1252 Filed Weights   04/15/19 1350 04/15/19 1730 04/17/19 1220  Weight: 122 kg 119.8 kg 125.4 kg    Telemetry    NSR - Personally Reviewed  ECG    NA - Personally Reviewed  Physical Exam   NA  Labs    Chemistry Recent Labs  Lab 04/13/19 1116 04/14/19 0200  04/15/19 1115 04/16/19 0418 04/17/19 0413  NA 135 138   < > 136 140 137  K 4.1 4.3   < > 4.7 5.0 5.3*  CL 90* 91*  --  90* 99 96*  CO2 26 27  --  23 20* 20*  GLUCOSE 95 86  --  170* 133* 150*  BUN 48* 64*  --  106* 71* 106*  CREATININE 13.63* 15.66*  --  18.59* 12.44* 14.79*  CALCIUM 9.0 8.7*  --  9.6 8.9 9.1  PROT 7.4 7.5  --   --   --  6.8  ALBUMIN 3.4* 3.3*  --   --   --  3.2*  AST 46* 42*  --   --    --  53*  ALT 65* 62*  --   --   --  90*  ALKPHOS 44 52  --   --   --  47  BILITOT 1.0 1.1  --   --   --  0.9  GFRNONAA 4* 3*  --  2* 4* 3*  GFRAA 4* 4*  --  3* 5* 4*  ANIONGAP 19* 20*  --  23* 21* 21*   < > = values in this interval not displayed.     Hematology Recent Labs  Lab 04/15/19 1115 04/16/19 0418 04/17/19 0413  WBC 6.3 6.5 4.6  RBC 3.56* 3.23* 3.51*  HGB 9.8* 8.9* 9.5*  HCT 28.4* 25.7* 28.0*  MCV 79.8* 79.6* 79.8*  MCH 27.5 27.6 27.1  MCHC 34.5 34.6 33.9  RDW 14.3 14.4 14.6  PLT 235 237 266  Cardiac EnzymesNo results for input(s): TROPONINI in the last 168 hours. No results for input(s): TROPIPOC in the last 168 hours.   BNP Recent Labs  Lab 04/14/19 0200  BNP 1,317.6*     DDimer  Recent Labs  Lab 04/14/19 0200  DDIMER 1.67*     Radiology    No results found.  Cardiac Studies   ECHO:  1. Left ventricular ejection fraction, by visual estimation, is 40 to 45%. The left ventricle has mild to moderately decreased function. There is moderately increased left ventricular hypertrophy.  2. Basal and mid inferior wall, basal and mid inferolateral wall, and apical lateral segment are abnormal.  3. Elevated left ventricular end-diastolic pressure.  4. Left ventricular diastolic parameters are consistent with Grade II diastolic dysfunction (pseudonormalization).  5. The left ventricle demonstrates regional wall motion abnormalities.  6. Global right ventricle has normal systolic function.The right ventricular size is normal. No increase in right ventricular wall thickness.  7. Left atrial size was severely dilated.  8. Right atrial size was moderately dilated.  9. The mitral valve is normal in structure. Mild mitral valve regurgitation. No evidence of mitral stenosis. 10. The tricuspid valve is normal in structure. Tricuspid valve regurgitation is not demonstrated. 11. The aortic valve is normal in structure. Aortic valve regurgitation is not visualized.  No evidence of aortic valve sclerosis or stenosis. 12. There is Mild calcification of the aortic valve. 13. There is Mild thickening of the aortic valve. 14. The pulmonic valve was normal in structure. Pulmonic valve regurgitation is not visualized. 15. The inferior vena cava is normal in size with greater than 50% respiratory variability, suggesting right atrial pressure of 3 mmHg.  Patient Profile     53 y.o. 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 Andria Frames.  Assessment & Plan    ELEVATED TROP:  hsTn 2115 ->1705->627.  No chest pain or EKG changes.  Echo shows new wall motion abnormality, with inferior/lateral hypokinesis.  EF 40-45%.  While troponin elevation may be demand ischemia in setting of COVID infection, given new segmental WMA would favor treating for NSTEMI. Would plan heparin gtt x48 hours.  Continue ASA, statin,coreg. Can plan for further ischemia evaluation once recovers from acute illness.    ACUTE SYSTOLIC HEART FAILURE:  Continue coreg.  Volume management per dialysis.    For questions or updates, please contact [place] Please consult [email or web address] for contact info under Cardiology/STEMI.   Signed, Donato Heinz, MD  04/17/2019, 12:52 PM

## 2019-04-17 NOTE — Progress Notes (Addendum)
Family Medicine Teaching Service Daily Progress Note Intern Pager: [phone]  Patient name: Troy Wells Medical record number: [id number] Date of birth: Oct 09, 1965 Age: 53 y.o. Gender: male  Primary Care Provider: [name], MD Consultants: Nephrology Code Status: Full  Pt Overview and Major Events to Date:  10/28-admission for Covid positive  Assessment and Plan: Troy Wells is a 53 y.o. male presenting with SOB, chills and diarrhea for 3 days, patient also noted to have recent COVID-19 exposure. PMH is significant for ESRD MWF HD, OSA, HTN, HLD, CHFpEF, pHTN, DM, diabetic neuropathy.    Acute Hypoxic Respiratory Failure likely 2/2 COVID  Some improvement overnight.  Attempt to wean to room air without success.  Now back on 3 L nasal cannula.  Physical exam shows euvolemic volume status.  Overall, improving well.  He reports great subjective improvement compared to admission.  -CCM following -Monitor respiration status closely -Titrate oxygen to maintain saturation greater than 92%. -Continuous cardiac and pulse ox monitoring -Continue remdesivir (10/28-11/1) -Continue Decadron 6 mg (10/27-11/5)  ESRD on HD MWF-dry weight 112.2 kg He may require a separate dialysis schedule due to his Covid positive status.  Will touch base with nephrology prior to discharge. -Nephro following -Continue Aranesp per nephrology  Anemia of chronic disease, stable Hemoglobin 9.5 this morning. -Nephrology following  -Patient Aranesp 40mcg weekly.  CHFpEF 60-65% as of 2017  CAD Assessed by cardiology.  Likely demand ischemia.  No intervention necessary at this time.  Echo shows EF 40-45%, mildly decreased function, mildly increased LVH, abnormalities in the inferior wall, inferior lateral wall and apical areas, LA severely dilated. -Cardiology following, appreciate recommendations -Continue Coreg and aspirin   Elevated transaminases AST 53 from 42, ALT 90 from 62.  Noted on  admission.  Platelets and INR normal.  Bilirubin normal.  No abdominal tenderness or complaints.  Possibly secondary to Covid infection.  We will follow-up hepatitis C for routine screening. -Follow-up HCV screening  Hypertension Normotensive overnight. - carvedilol 25 mg twice daily - minoxidil 20 mg twice daily  OSA with use of CPAP  Patient uses CPAP at night.  In light of positive Covid unable to use wall in hospital.  HLD -Continue atorvastatin  Diabetes CBG overnight 195.  Required 3 units insulin coverage yesterday. -Continue Tradjenta 5 mg daily -Sensitive scale insulin coverage with meals -Monitor CBGs   Diabetic Neuropathy  -Continue gabapentin 300 3 times daily  Gout  -Asymptomatic   FEN/GI: Heart healthy  Prophylaxis: Heparin 5000 units every 8    Disposition: Possible discharge home in the next day or 2.  Subjective:  No acute events overnight.  Failed attempt at weaning oxygen to room air this morning.  Overall, feels significantly improved.  Specifically denies chest pain, abdominal pain.  He notes that sleeping is a little more difficult in the hospital because he cannot use his CPAP.  Objective: Temp:  [97.9 F (36.6 C)-98.1 F (36.7 C)] 98.1 F (36.7 C) (10/30 2009) Pulse Rate:  [72-79] 72 (10/30 2009) Resp:  [16-20] 16 (10/30 2009) BP: (122-134)/(68-72) 134/68 (10/30 2009) SpO2:  [87 %-98 %] 98 % (10/30 2009)  Physical Exam: General: Alert and cooperative and appears to be in no acute distress.  Resting in his chair comfortably speaking on the phone with his family. HEENT: Neck non-tender without lymphadenopathy, masses or thyromegaly Cardio: Normal S1 and S2, no S3 or S4. Rhythm is regular.  2/6 systolic murmur.   Pulm: Clear to auscultation bilaterally, no crackles, wheezing, or diminished  breath sounds. Normal respiratory effort Abdomen: Bowel sounds normal. Abdomen soft and non-tender.  Extremities: No peripheral edema. Warm/ well perfused.   Strong right radial pulse. Neuro: Cranial nerves grossly intact   Laboratory: Recent Labs  Lab 04/15/19 1115 04/16/19 0418 04/17/19 0413  WBC 6.3 6.5 4.6  HGB 9.8* 8.9* 9.5*  HCT 28.4* 25.7* 28.0*  PLT 235 237 266   Recent Labs  Lab 04/13/19 1116 04/14/19 0200  04/15/19 1115 04/16/19 0418 04/17/19 0413  NA 135 138   < > 136 140 137  K 4.1 4.3   < > 4.7 5.0 5.3*  CL 90* 91*  --  90* 99 96*  CO2 26 27  --  23 20* 20*  BUN 48* 64*  --  106* 71* 106*  CREATININE 13.63* 15.66*  --  18.59* 12.44* 14.79*  CALCIUM 9.0 8.7*  --  9.6 8.9 9.1  PROT 7.4 7.5  --   --   --  6.8  BILITOT 1.0 1.1  --   --   --  0.9  ALKPHOS 44 52  --   --   --  47  ALT 65* 62*  --   --   --  90*  AST 46* 42*  --   --   --  53*  GLUCOSE 95 86  --  170* 133* 150*   < > = values in this interval not displayed.      Imaging/Diagnostic Tests:   Matilde Haymaker, MD 04/17/2019, 7:17 AM PGY-2, [place] Intern pager: [phone], text pages welcome

## 2019-04-17 NOTE — Progress Notes (Addendum)
[facility] Progress Note  Subjective:   Patient not examined directly given COVID-19 + status, utilizing exam of the primary team and observations of RN's.     Vitals:   04/17/19 1245 04/17/19 1300 04/17/19 1315 04/17/19 1330  BP: 122/64 122/64 (!) 121/57 (!) 111/59  Pulse: 68 64 64 66  Resp:      Temp:      TempSrc:      SpO2:      Weight:      Height:        Inpatient medications: . aspirin  81 mg Oral Daily  . atorvastatin  80 mg Oral Daily  . carvedilol  25 mg Oral BID WC  . Chlorhexidine Gluconate Cloth  6 each Topical Q0600  . Chlorhexidine Gluconate Cloth  6 each Topical Q0600  . Darbepoetin Alfa      . darbepoetin (ARANESP) injection - NON-DIALYSIS  60 mcg Subcutaneous Q Fri-1800  . dexamethasone  6 mg Oral Daily  . gabapentin  300 mg Oral QHS  . heparin injection (subcutaneous)  5,000 Units Subcutaneous Q8H  . insulin aspart  0-9 Units Subcutaneous TID WC  . linagliptin  5 mg Oral Daily  . minoxidil  20 mg Oral BID   . remdesivir 100 mg in NS 250 mL 100 mg (04/17/19 6222)   acetaminophen    Exam:  Patient not examined directly given COVID-19 + status, utilizing exam of the primary team and observations of RN's.    Home meds:  - amlodipine 10/ carvedilol 25 bid/ minoxidil 20 bid/ torsemide 100mg  tts  - glipizide 5 qd  - gabapentin 300 hs  - allopurinol 100 bid  - aspirin 81/ atorvastatin 40 qd  - prn's/ vitamins/ supplements     [facility]pt HD: [facility]   4.5h  450/800  121.5kg  2/2 bath P4  Hep 60000+ 2535midrun  LUA AVF + interposition Artegraft 03/2018  - last HD 10/26, under edw  - mircera 75 q2, last 10/16  - calc 1.5 ug tiw  - home sensipar 90 qd/ velphoro 2ac    Assessment/ Plan: 1. COVID+ PNA/ hypoxemia - per PMD is doing well 2. +Trop - seen by cards, has new WMA on echo, starting IV heparin x 48hrs.  Cont asa, statin, coreg.  Can due ischemia eval later when pt recovers.  3. ESRD - HD MWF. HD today off schedule. When  dc'd he will switch to TTS at [facility] unit while a Covid-19 case.   4. HTN - getting coreg / minoxidil at home dosing 5. Volume - 2kg under prior dry wt. No vol ^ on exam. Min UF on HD Sat 6. DM2 per primary 7. Anemia CKD - Hb 9.6, last esa 10/16 > due 10/30, written for darbe 60ug q Friday    Rob [place] 04/17/2019, 1:45 PM  Iron/TIBC/Ferritin/ %Sat No results found for: IRON, TIBC, FERRITIN, IRONPCTSAT Recent Labs  Lab 04/14/19 0200  04/17/19 0413  NA 138   < > 137  K 4.3   < > 5.3*  CL 91*   < > 96*  CO2 27   < > 20*  GLUCOSE 86   < > 150*  BUN 64*   < > 106*  CREATININE 15.66*   < > 14.79*  CALCIUM 8.7*   < > 9.1  ALBUMIN 3.3*  --  3.2*  INR 1.1  --   --    < > = values in this interval not displayed.  Recent Labs  Lab 04/17/19 0413  AST 53*  ALT 90*  ALKPHOS 47  BILITOT 0.9  PROT 6.8   Recent Labs  Lab 04/17/19 0413  WBC 4.6  HGB 9.5*  HCT 28.0*  PLT 266

## 2019-04-18 DIAGNOSIS — I252 Old myocardial infarction: Secondary | ICD-10-CM

## 2019-04-18 DIAGNOSIS — I214 Non-ST elevation (NSTEMI) myocardial infarction: Secondary | ICD-10-CM

## 2019-04-18 DIAGNOSIS — I2510 Atherosclerotic heart disease of native coronary artery without angina pectoris: Secondary | ICD-10-CM

## 2019-04-18 HISTORY — DX: Old myocardial infarction: I25.2

## 2019-04-18 LAB — BASIC METABOLIC PANEL
Anion gap: 19 — ABNORMAL HIGH (ref 5–15)
BUN: 91 mg/dL — ABNORMAL HIGH (ref 6–20)
CO2: 24 mmol/L (ref 22–32)
Calcium: 9.7 mg/dL (ref 8.9–10.3)
Chloride: 94 mmol/L — ABNORMAL LOW (ref 98–111)
Creatinine, Ser: 11.67 mg/dL — ABNORMAL HIGH (ref 0.61–1.24)
GFR calc Af Amer: 5 mL/min — ABNORMAL LOW (ref >60)
GFR calc non Af Amer: 4 mL/min — ABNORMAL LOW (ref >60)
Glucose, Bld: 220 mg/dL — ABNORMAL HIGH (ref 70–99)
Potassium: 5.7 mmol/L — ABNORMAL HIGH (ref 3.5–5.1)
Sodium: 137 mmol/L (ref 135–145)

## 2019-04-18 LAB — GLUCOSE, CAPILLARY
Glucose-Capillary: 136 mg/dL — ABNORMAL HIGH (ref 70–99)
Glucose-Capillary: 190 mg/dL — ABNORMAL HIGH (ref 70–99)
Glucose-Capillary: 247 mg/dL — ABNORMAL HIGH (ref 70–99)
Glucose-Capillary: 199 mg/dL — ABNORMAL HIGH (ref 70–99)

## 2019-04-18 LAB — COMPREHENSIVE METABOLIC PANEL
ALT: 163 U/L — ABNORMAL HIGH (ref 0–44)
AST: 119 U/L — ABNORMAL HIGH (ref 15–41)
Albumin: 3.2 g/dL — ABNORMAL LOW (ref 3.5–5.0)
Alkaline Phosphatase: 50 U/L (ref 38–126)
Anion gap: 19 — ABNORMAL HIGH (ref 5–15)
BUN: 79 mg/dL — ABNORMAL HIGH (ref 6–20)
CO2: 22 mmol/L (ref 22–32)
Calcium: 9.5 mg/dL (ref 8.9–10.3)
Chloride: 95 mmol/L — ABNORMAL LOW (ref 98–111)
Creatinine, Ser: 10.76 mg/dL — ABNORMAL HIGH (ref 0.61–1.24)
GFR calc Af Amer: 6 mL/min — ABNORMAL LOW (ref >60)
GFR calc non Af Amer: 5 mL/min — ABNORMAL LOW (ref >60)
Glucose, Bld: 150 mg/dL — ABNORMAL HIGH (ref 70–99)
Potassium: 6 mmol/L — ABNORMAL HIGH (ref 3.5–5.1)
Sodium: 136 mmol/L (ref 135–145)
Total Bilirubin: 1.3 mg/dL — ABNORMAL HIGH (ref 0.3–1.2)
Total Protein: 6.9 g/dL (ref 6.5–8.1)

## 2019-04-18 LAB — CBC
HCT: 27.2 % — ABNORMAL LOW (ref 39.0–52.0)
Hemoglobin: 9.3 g/dL — ABNORMAL LOW (ref 13.0–17.0)
MCH: 27 pg (ref 26.0–34.0)
MCHC: 34.2 g/dL (ref 30.0–36.0)
MCV: 79.1 fL — ABNORMAL LOW (ref 80.0–100.0)
Platelets: 285 10*3/uL (ref 150–400)
RBC: 3.44 MIL/uL — ABNORMAL LOW (ref 4.22–5.81)
RDW: 14.6 % (ref 11.5–15.5)
WBC: 6.5 10*3/uL (ref 4.0–10.5)
nRBC: 0 % (ref 0.0–0.2)

## 2019-04-18 LAB — HEPARIN LEVEL (UNFRACTIONATED): Heparin Unfractionated: 0.45 IU/mL (ref 0.30–0.70)

## 2019-04-18 MED ORDER — SODIUM ZIRCONIUM CYCLOSILICATE 10 G PO PACK
10.0000 g | PACK | Freq: Three times a day (TID) | ORAL | Status: AC
  Administered 2019-04-18 – 2019-04-19 (×5): 10 g via ORAL
  Filled 2019-04-18 (×5): qty 1

## 2019-04-18 MED ORDER — CHLORHEXIDINE GLUCONATE CLOTH 2 % EX PADS: 6.0000 | MEDICATED_PAD | Freq: Every day | CUTANEOUS | Status: DC

## 2019-04-18 MED ORDER — HEPARIN (PORCINE) 25000 UT/250ML-% IV SOLN
900.0000 [IU]/h | INTRAVENOUS | Status: DC
  Administered 2019-04-18 – 2019-04-19 (×2): 1300 [IU]/h via INTRAVENOUS
  Administered 2019-04-20: 900 [IU]/h via INTRAVENOUS
  Filled 2019-04-18 (×3): qty 250

## 2019-04-18 NOTE — Progress Notes (Signed)
ANTICOAGULATION CONSULT NOTE   Pharmacy Consult for heparin Indication: chest pain/ACS  No Known Allergies  Patient Measurements: Height: 6' (182.9 cm) Weight: 261 lb 0.4 oz (118.4 kg) IBW/kg (Calculated) : 77.6 HEPARIN DW (KG): 103.3  Vital Signs: Temp: 97.8 F (36.6 C) (11/01 1541) Temp Source: Oral (11/01 1541) BP: 142/68 (11/01 1541) Pulse Rate: 70 (11/01 1541)  Labs: Recent Labs    04/16/19 0418 04/17/19 0413 04/18/19 0604 04/18/19 1753  HGB 8.9* 9.5* 9.3*  --   HCT 25.7* 28.0* 27.2*  --   PLT 237 266 285  --   HEPARINUNFRC  --   --   --  0.45  CREATININE 12.44* 14.79* 10.76* 11.67*    Estimated Creatinine Clearance: 9.6 mL/min (A) (by C-G formula based on SCr of 11.67 mg/dL (H)).   Medical History: Past Medical History:  Diagnosis Date  . CARDIAC ARREST 11/01/2009   Qualifier: Diagnosis of  By: Selena Batten CMA, Jewel    . Colon polyps 01/02/2016   Colonoscopy July 2017 - One 3 mm polyp in the transverse colon, removed with a cold biopsy forceps. Resected and retrieved. - One 3 mm polyp in the rectum, removed with a cold biopsy forceps. Resected and retrieved. - Diverticulosis in the entire examined colon. - Non-bleeding internal hemorrhoids. - The examination was otherwise normal. - Significant looping which prolonged cecal    . Coronary artery disease 08/15/2009   with AMI  . Dialysis patient [facility]) 09-27-2014   mon, wed, and fri  . Diverticulosis of colon without hemorrhage 01/02/2016   Colonoscopy July 2017 - One 3 mm polyp in the transverse colon, removed with a cold biopsy forceps. Resected and retrieved. - One 3 mm polyp in the rectum, removed with a cold biopsy forceps. Resected and retrieved. - Diverticulosis in the entire examined colon. - Non-bleeding internal hemorrhoids. - The examination was otherwise normal. - Significant looping which prolonged cecal    . DM (diabetes mellitus), type 2 with renal complications ([place]) 08/08/9796  . Essential  hypertension 11/01/2009   Qualifier: Diagnosis of  By: Selena Batten CMA, Jewel    . Hyperlipidemia   . Hypertension   . Mitral valve regurgitation 09/19/2015   Echo 09/2015   . OSA treated with BiPAP 06/17/2010  . Sleep apnea    cpap nightly     Assessment: 53 y.o. 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 Assessed by cardiology for evaluation ofelevated troponin (demand ischemia in setting of COVID infection vs ACS) and recommend treating as NSTEMI with heparin infusion for 48 hours. Pharmacy has been consulted to dose heparin infusion.  Patient's CBC is stable with Hgb low normal at 9.3, platelets 285. No prior to admission anticoagulation.  Heparin level this evening came back therapeutic at 0.45, on 1300 units/hr. No s/sx of bleeding or infusion issues.   Goal of Therapy:  Heparin level 0.3-0.7 units/ml Monitor platelets by anticoagulation protocol: Yes   Plan:  Continue heparin at 1300 units/hr (no bolus) Check 8h HL Daily CBC and HL while on heparin Monitor for s/sx bleeding  Antonietta Jewel, PharmD, BCCCP Clinical Pharmacist  Phone: [phone]  Please check AMION for all [place] phone numbers After 10:00 PM, call [place] [phone]  04/18/2019   7:37 PM

## 2019-04-18 NOTE — Progress Notes (Signed)
Family Medicine Teaching Service Daily Progress Note Intern Pager: [phone]  Patient name: Troy Wells Medical record number: [id number] Date of birth: [birth year] Age: 53 y.o. Gender: male  Primary Care Provider: [name], MD Consultants: Nephrology Code Status: Full  Pt Overview and Major Events to Date:  04/14/2019 - Admitted for dyspnea 2/2 COVID  Assessment and Plan: Troy Wells a 53 y.o.malepresenting with SOB, chills and diarrhea for 3 days, found to be COVID+. PMH is significant forESRDMWF HD,OSA, HTN, HLD, CHFpEF,pHTN,DM, diabetic neuropathy.   Acute Hypoxic Respiratory Failure likely 2/2 COVID  Attempt to wean to room air without success. Now back on ~4 L nasal cannula.  Physical exam shows euvolemic volume status. Reports improvement since admission. -CCM signed off -Monitor respiration status closely -Titrate oxygen to maintain saturation greater than 92%. -Continuous cardiac and pulse ox monitoring -Continue remdesivir, last day is today 11/1 -Continue Decadron 6 mg (10/27-11/5)  ESRD on HD MWF - dry weight 112.2 kg He may require a separate dialysis schedule due to his Covid positive status. Will touch base with nephrology prior to discharge. -Nephro following -Continue Aranesp 71mcg weekly per nephrology  Anemia of chronic disease, stable Hemoglobin 9.3 this AM 11/1. -Nephrology following  -Patient on Aranesp 66mcg weekly.  CHFpEF 60-65% as of 2017 CAD Assessed by cardiology.  Likely demand ischemia. Echo 04/16/2019 shows EF 40-45%, mildly decreased function, mildly increased LVH, abnormalities in the inferior wall, inferior lateral wall and apical areas, LA severely dilated. -Cardiology following, appreciate recommendations -Continue Coreg, Statin and aspirin -Heparin gtt 11/1 - 11/3   Elevated transaminases, acutely worsened AST 42 > 119, ALT 62>163. Noted on admission. Total bili elevated at 1.3. Platelets and INR wnl.  Possibly secondary to Covid infection. Hep C Ab negative. -Monitor  Hypertension Normotensive overnight. - Carvedilol 25 mg BID - Minoxidil 20 mg BID  OSA with use of CPAP Patient uses CPAP at night at home. Since patient is COVID+ cannot use CPAP while in hospital.  HLD -Continue atorvastatin  Diabetes CBG overnight 136-229.  Required 7 units insulin coverage in last 24 hours. -Continue Tradjenta 5 mg daily -Sensitive scale insulin coverage with meals -Monitor CBGs   Diabetic Neuropathy  -Continue gabapentin 300mg  TID   FEN/GI: Heart healthy diet PPx: Heparin gtt 11/1 - 11/3  Disposition: Plan to d/c after Heparin gtt completed 11/3, need to establish O2 and HD support prior to d/c.  Subjective:  Patient reports feeling better today.  States he is still requiring oxygen, especially while ambulatory.  Denies any fevers, chills, body aches, and diarrhea.  Very pleasant patient.  Objective: Temp:  [97.6 F (36.4 C)-98 F (36.7 C)] 98 F (36.7 C) (11/01 0900) Pulse Rate:  [60-84] 79 (11/01 0900) Resp:  [16-18] 16 (11/01 0900) BP: (102-154)/(44-73) 139/73 (11/01 0900) SpO2:  [95 %-99 %] 97 % (11/01 0900) Weight:  [118.4 kg-125.4 kg] 118.4 kg (10/31 1531) Physical Exam: General: pleasant patient, no apparent distress, nontoxic appearng Cardiovascular: RRR S1S2 present, no murmurs appreciated Respiratory: mildly coarse breath sounds but moving air well, [place] in place at ~4L, normal work of breathing at rest Extremities: No edema, cyanosis or deformity appreciated  Laboratory: Recent Labs  Lab 04/16/19 0418 04/17/19 0413 04/18/19 0604  WBC 6.5 4.6 6.5  HGB 8.9* 9.5* 9.3*  HCT 25.7* 28.0* 27.2*  PLT 237 266 285   Recent Labs  Lab 04/14/19 0200  04/16/19 0418 04/17/19 0413 04/18/19 0604  NA 138   < > 140 137 136  K  4.3   < > 5.0 5.3* 6.0*  CL 91*   < > 99 96* 95*  CO2 27   < > 20* 20* 22  BUN 64*   < > 71* 106* 79*  CREATININE 15.66*   < > 12.44*  14.79* 10.76*  CALCIUM 8.7*   < > 8.9 9.1 9.5  PROT 7.5  --   --  6.8 6.9  BILITOT 1.1  --   --  0.9 1.3*  ALKPHOS 52  --   --  47 50  ALT 62*  --   --  90* 163*  AST 42*  --   --  53* 119*  GLUCOSE 86   < > 133* 150* 150*   < > = values in this interval not displayed.    Imaging/Diagnostic Tests: No new imaging  Troy Floro, DO 04/18/2019, 10:11 AM PGY-2, [place] Intern pager: [phone], text pages welcome

## 2019-04-18 NOTE — Progress Notes (Signed)
Patient presented with troponin elevation (hsTn 2115) that was thought to be likely demand ischemia in setting of Covid infection.  However echo shows new regional wall motion abnormality, with inferior/lateral hypokinesis and EF 40 to 45%.  While this could represent an NSTEMI, there are no EKG changes or chest pain to suggest an acute coronary syndrome.  Myocarditis is also on the differential, as this can be seen in patients with Covid and while global hypokinesis is often seen, there can be regional wall motion abnormalities.  I think would be reasonable to manage medically for possible NSTEMI, with heparin drip x48 hours.  Continue aspirin, statin, and carvedilol.  No indication for heart catheterization at this time.  Recommend outpatient follow-up with plans to repeat echocardiogram and consider ischemia evaluation once recovered from acute illness.  Donato Heinz, MD

## 2019-04-18 NOTE — Progress Notes (Signed)
ANTICOAGULATION CONSULT NOTE - Initial Consult  Pharmacy Consult for heparin Indication: chest pain/ACS  No Known Allergies  Patient Measurements: Height: 6' (182.9 cm) Weight: 261 lb 0.4 oz (118.4 kg) IBW/kg (Calculated) : 77.6 HEPARIN DW (KG): 103.3  Vital Signs: Temp: 97.6 F (36.4 C) (10/31 2100) Temp Source: Oral (10/31 2100) BP: 154/69 (10/31 2100) Pulse Rate: 84 (10/31 2100)  Labs: Recent Labs    04/15/19 1115 04/16/19 0418 04/17/19 0413 04/18/19 0604  HGB 9.8* 8.9* 9.5* 9.3*  HCT 28.4* 25.7* 28.0* 27.2*  PLT 235 237 266 285  CREATININE 18.59* 12.44* 14.79* 10.76*  TROPONINIHS 627*  --   --   --     Estimated Creatinine Clearance: 10.4 mL/min (A) (by C-G formula based on SCr of 10.76 mg/dL (H)).   Medical History: Past Medical History:  Diagnosis Date  . CARDIAC ARREST 11/01/2009   Qualifier: Diagnosis of  By: Selena Batten CMA, Jewel    . Colon polyps 01/02/2016   Colonoscopy July 2017 - One 3 mm polyp in the transverse colon, removed with a cold biopsy forceps. Resected and retrieved. - One 3 mm polyp in the rectum, removed with a cold biopsy forceps. Resected and retrieved. - Diverticulosis in the entire examined colon. - Non-bleeding internal hemorrhoids. - The examination was otherwise normal. - Significant looping which prolonged cecal    . Coronary artery disease 08/15/2009   with AMI  . Dialysis patient [facility]) 09-27-2014   mon, wed, and fri  . Diverticulosis of colon without hemorrhage 01/02/2016   Colonoscopy July 2017 - One 3 mm polyp in the transverse colon, removed with a cold biopsy forceps. Resected and retrieved. - One 3 mm polyp in the rectum, removed with a cold biopsy forceps. Resected and retrieved. - Diverticulosis in the entire examined colon. - Non-bleeding internal hemorrhoids. - The examination was otherwise normal. - Significant looping which prolonged cecal    . DM (diabetes mellitus), type 2 with renal complications ([place]) 7/65/4650   . Essential hypertension 11/01/2009   Qualifier: Diagnosis of  By: Selena Batten CMA, Jewel    . Hyperlipidemia   . Hypertension   . Mitral valve regurgitation 09/19/2015   Echo 09/2015   . OSA treated with BiPAP 06/17/2010  . Sleep apnea    cpap nightly     Assessment: 53 y.o. 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 Assessed by cardiology for evaluation ofelevated troponin (demand ischemia in setting of COVID infection vs ACS) and recommend treating as NSTEMI with heparin infusion for 48 hours. Pharmacy has been consulted to dose heparin infusion.  Patient's CBC is stable with Hgb low normal at 9.3, platelets 285. No bleeding noted by RN. Last dose of subcutaneous heparin was <3 hours ago (11/1 @ 0520). No prior to admission anticoagulation.  Goal of Therapy:  Heparin level 0.3-0.7 units/ml Monitor platelets by anticoagulation protocol: Yes   Plan:  Start heparin at 1300 units/hr (no bolus) Check 8h HL Daily CBC and HL while on heparin Monitor for s/sx bleeding   Brendolyn Patty, PharmD PGY2 Pharmacy Resident Phone [phone]  04/18/2019   8:02 AM

## 2019-04-18 NOTE — Progress Notes (Addendum)
[facility] Progress Note  Subjective:   Patient not examined directly given COVID-19 + status, utilizing exam of the primary team and observations of RN's.  K+ up to 6.0 today.       Vitals:   04/17/19 1531 04/17/19 1711 04/17/19 2100 04/18/19 0900  BP: (!) 103/51 131/68 (!) 154/69 139/73  Pulse: 74 74 84 79  Resp: 16 17  16   Temp: 97.8 F (36.6 C) 97.7 F (36.5 C) 97.6 F (36.4 C) 98 F (36.7 C)  TempSrc: Oral Oral Oral Oral  SpO2: 98% 99% 95% 97%  Weight: 118.4 kg     Height:        Inpatient medications: . aspirin  81 mg Oral Daily  . atorvastatin  80 mg Oral Daily  . carvedilol  25 mg Oral BID WC  . Chlorhexidine Gluconate Cloth  6 each Topical Q0600  . Chlorhexidine Gluconate Cloth  6 each Topical Q0600  . dexamethasone  6 mg Oral Daily  . gabapentin  300 mg Oral QHS  . insulin aspart  0-9 Units Subcutaneous TID WC  . linagliptin  5 mg Oral Daily  . minoxidil  20 mg Oral BID  . sodium zirconium cyclosilicate  10 g Oral TID   . heparin 1,300 Units/hr (04/18/19 3419)   acetaminophen    Exam:  Patient not examined directly given COVID-19 + status, utilizing exam of the primary team and observations of RN's.    Home meds:  - amlodipine 10/ carvedilol 25 bid/ minoxidil 20 bid/ torsemide 100mg  tts  - glipizide 5 qd  - gabapentin 300 hs  - allopurinol 100 bid  - aspirin 81/ atorvastatin 40 qd  - prn's/ vitamins/ supplements     Outpt HD: [facility]   4.5h  450/800  121.5kg  2/2 bath P4  Hep 60000+ 2566midrun  LUA AVF + interposition Artegraft 03/2018  - last HD 10/26, under edw  - mircera 75 q2, last 10/16  - calc 1.5 ug tiw  - home sensipar 90 qd/ velphoro 2ac    Assessment/ Plan: 1. COVID+ PNA/ hypoxemia - per PMD is doing well 2. Elevated troponin - max 1705 > 627. Seen by cards, has new WMA on echo, starting IV heparin x 48hrs.  Cont asa, statin, coreg.  Will be here through tomorrow. Will need ischemia eval later when pt  recovers.  3. Hyperkalemia - K+ 6 today unfortunately, changed to renal diet and starting Lokelma. HD tomorrow w/ low K+bath, may need tonight if K+ worse. Will repeat 4pm.  4. ESRD - HD MWF. HD again Monday for K+/solute control.  When dc'd home will switch to TTS at [facility] unit while a Covid-19 case.   5. HTN - getting coreg / minoxidil at home dosing, norvasc on hold. BP's good.  6. Volume - 2- 3kg under prior dry wt. No sig vol on my last exam. Min UF on hd.  7. DM2 per primary 8. Anemia CKD - Hb 9- 10, last esa 10/16 > due 10/30, got darbe 60ug here on 04/17/19    Rob Karem Tomaso 04/18/2019, 11:45 AM  Iron/TIBC/Ferritin/ %Sat No results found for: IRON, TIBC, FERRITIN, IRONPCTSAT Recent Labs  Lab 04/14/19 0200  04/18/19 0604  NA 138   < > 136  K 4.3   < > 6.0*  CL 91*   < > 95*  CO2 27   < > 22  GLUCOSE 86   < > 150*  BUN 64*   < >  79*  CREATININE 15.66*   < > 10.76*  CALCIUM 8.7*   < > 9.5  ALBUMIN 3.3*   < > 3.2*  INR 1.1  --   --    < > = values in this interval not displayed.   Recent Labs  Lab 04/18/19 0604  AST 119*  ALT 163*  ALKPHOS 50  BILITOT 1.3*  PROT 6.9   Recent Labs  Lab 04/18/19 0604  WBC 6.5  HGB 9.3*  HCT 27.2*  PLT 285

## 2019-04-19 LAB — COMPREHENSIVE METABOLIC PANEL
ALT: 180 U/L — ABNORMAL HIGH (ref 0–44)
AST: 82 U/L — ABNORMAL HIGH (ref 15–41)
Albumin: 3.1 g/dL — ABNORMAL LOW (ref 3.5–5.0)
Alkaline Phosphatase: 52 U/L (ref 38–126)
Anion gap: 20 — ABNORMAL HIGH (ref 5–15)
BUN: 105 mg/dL — ABNORMAL HIGH (ref 6–20)
CO2: 21 mmol/L — ABNORMAL LOW (ref 22–32)
Calcium: 9.2 mg/dL (ref 8.9–10.3)
Chloride: 93 mmol/L — ABNORMAL LOW (ref 98–111)
Creatinine, Ser: 13.05 mg/dL — ABNORMAL HIGH (ref 0.61–1.24)
GFR calc Af Amer: 4 mL/min — ABNORMAL LOW (ref >60)
GFR calc non Af Amer: 4 mL/min — ABNORMAL LOW (ref >60)
Glucose, Bld: 132 mg/dL — ABNORMAL HIGH (ref 70–99)
Potassium: 5.2 mmol/L — ABNORMAL HIGH (ref 3.5–5.1)
Sodium: 134 mmol/L — ABNORMAL LOW (ref 135–145)
Total Bilirubin: 1.1 mg/dL (ref 0.3–1.2)
Total Protein: 6.9 g/dL (ref 6.5–8.1)

## 2019-04-19 LAB — GLUCOSE, CAPILLARY
Glucose-Capillary: 145 mg/dL — ABNORMAL HIGH (ref 70–99)
Glucose-Capillary: 168 mg/dL — ABNORMAL HIGH (ref 70–99)
Glucose-Capillary: 134 mg/dL — ABNORMAL HIGH (ref 70–99)

## 2019-04-19 LAB — HEPARIN LEVEL (UNFRACTIONATED)
Heparin Unfractionated: 0.81 IU/mL — ABNORMAL HIGH (ref 0.30–0.70)
Heparin Unfractionated: 0.86 IU/mL — ABNORMAL HIGH (ref 0.30–0.70)

## 2019-04-19 LAB — CBC
HCT: 26.2 % — ABNORMAL LOW (ref 39.0–52.0)
Hemoglobin: 9 g/dL — ABNORMAL LOW (ref 13.0–17.0)
MCH: 27.6 pg (ref 26.0–34.0)
MCHC: 34.4 g/dL (ref 30.0–36.0)
MCV: 80.4 fL (ref 80.0–100.0)
Platelets: 310 10*3/uL (ref 150–400)
RBC: 3.26 MIL/uL — ABNORMAL LOW (ref 4.22–5.81)
RDW: 14.6 % (ref 11.5–15.5)
WBC: 8.2 10*3/uL (ref 4.0–10.5)
nRBC: 0 % (ref 0.0–0.2)

## 2019-04-19 MED ORDER — HEPARIN SODIUM (PORCINE) 1000 UNIT/ML DIALYSIS
6000.0000 [IU] | Freq: Once | INTRAMUSCULAR | Status: DC
  Filled 2019-04-19: qty 6

## 2019-04-19 MED ORDER — HEPARIN SODIUM (PORCINE) 1000 UNIT/ML DIALYSIS
6000.0000 [IU] | Freq: Once | INTRAMUSCULAR | Status: AC
  Administered 2019-04-19: 08:00:00 6000 [IU] via INTRAVENOUS_CENTRAL

## 2019-04-19 MED ORDER — HEPARIN SODIUM (PORCINE) 1000 UNIT/ML IJ SOLN
INTRAMUSCULAR | Status: AC
  Administered 2019-04-19: 6000 [IU] via INTRAVENOUS_CENTRAL
  Filled 2019-04-19: qty 6

## 2019-04-19 MED ORDER — ALBUTEROL SULFATE HFA 108 (90 BASE) MCG/ACT IN AERS
1.0000 | INHALATION_SPRAY | Freq: Four times a day (QID) | RESPIRATORY_TRACT | Status: DC | PRN
  Filled 2019-04-19: qty 6.7

## 2019-04-19 NOTE — Progress Notes (Signed)
ANTICOAGULATION CONSULT NOTE   Pharmacy Consult for heparin Indication: chest pain/ACS  No Active Allergies  Patient Measurements: Height: 6' (182.9 cm) Weight: 262 lb 5.6 oz (119 kg) IBW/kg (Calculated) : 77.6 HEPARIN DW (KG): 103.3  Vital Signs: Temp: 97.5 F (36.4 C) (11/02 0744) Temp Source: Oral (11/02 0744) BP: 123/64 (11/02 0800) Pulse Rate: 63 (11/02 0800)  Labs: Recent Labs    04/17/19 0413 04/18/19 0604 04/18/19 1753 04/19/19 0644  HGB 9.5* 9.3*  --  9.0*  HCT 28.0* 27.2*  --  26.2*  PLT 266 285  --  310  HEPARINUNFRC  --   --  0.45 0.81*  CREATININE 14.79* 10.76* 11.67* 13.05*    Estimated Creatinine Clearance: 8.6 mL/min (A) (by C-G formula based on SCr of 13.05 mg/dL (H)).   Medical History: Past Medical History:  Diagnosis Date  . CARDIAC ARREST 11/01/2009   Qualifier: Diagnosis of  By: Selena Batten CMA, [place]    . Colon polyps 01/02/2016   Colonoscopy July 2017 - One 3 mm polyp in the transverse colon, removed with a cold biopsy forceps. Resected and retrieved. - One 3 mm polyp in the rectum, removed with a cold biopsy forceps. Resected and retrieved. - Diverticulosis in the entire examined colon. - Non-bleeding internal hemorrhoids. - The examination was otherwise normal. - Significant looping which prolonged cecal    . Coronary artery disease 08/15/2009   with AMI  . Dialysis patient [facility]) 09-27-2014   mon, wed, and fri  . Diverticulosis of colon without hemorrhage 01/02/2016   Colonoscopy July 2017 - One 3 mm polyp in the transverse colon, removed with a cold biopsy forceps. Resected and retrieved. - One 3 mm polyp in the rectum, removed with a cold biopsy forceps. Resected and retrieved. - Diverticulosis in the entire examined colon. - Non-bleeding internal hemorrhoids. - The examination was otherwise normal. - Significant looping which prolonged cecal    . DM (diabetes mellitus), type 2 with renal complications ([place]) 3/78/5885  . Essential  hypertension 11/01/2009   Qualifier: Diagnosis of  By: Selena Batten CMA, Jewel    . Hyperlipidemia   . Hypertension   . Mitral valve regurgitation 09/19/2015   Echo 09/2015   . OSA treated with BiPAP 06/17/2010  . Sleep apnea    cpap nightly     Assessment: 53 y.o. 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 Assessed by cardiology for evaluation ofelevated troponin (demand ischemia in setting of COVID infection vs ACS) and recommend treating as NSTEMI with heparin infusion for 48 hours. Pharmacy has been consulted to dose heparin infusion.  Patient's CBC is stable with Hgb low normal at 9, platelets 310. No prior to admission anticoagulation.  Heparin level this morning came back supratherapeutic at 0.8, on 1300 units/hr. No s/sx of bleeding or infusion issues.   Goal of Therapy:  Heparin level 0.3-0.7 units/ml Monitor platelets by anticoagulation protocol: Yes   Plan:  Decrease heparin infusion to 1150 units/hr Check 8h HL Daily CBC and HL while on heparin Monitor for s/sx bleeding  Agnes Lawrence, PharmD PGY1 Pharmacy Resident   Please check AMION for all [facility] phone numbers After 10:00 PM, call [place] [phone]  04/19/2019   8:37 AM

## 2019-04-19 NOTE — Progress Notes (Addendum)
Family Medicine Teaching Service Daily Progress Note Intern Pager: [phone]  Patient name: Troy Wells Medical record number: [id number] Date of birth: 1966-01-14 Age: 53 y.o. Gender: male  Primary Care Provider: [name], MD Consultants: Nephrology Code Status: Full  Pt Overview and Major Events to Date:  04/14/2019 - Admitted for dyspnea 2/2 COVID  Assessment and Plan: Grady Harrisis a 54 y.o.malepresenting with SOB, chills and diarrhea for 3 days, found to be COVID+. PMH is significant forESRDMWF HD,OSA, HTN, HLD, CHFpEF,pHTN,DM, diabetic neuropathy.   Acute Hypoxic Respiratory Failure likely 2/2 COVID Attempt to wean to room air without success. Now back on ~4 L nasal cannula. Physical exam showseuvolemic volume status.Reports improvement since admission. Completed 5 days of remdesvir.  -CCM signed off -Monitor respiration status closely -Titrate oxygen to maintain saturation greater than 92%. -Continuous cardiac and pulse ox monitoring -Ambulatory pulse ox 11/2 -Continue Decadron 6 mg (10/27-11/5) (10 days)  Hyperkalemia: went from 6.0 yesterday morning down to 5.7 yesterday afternoon 11/1. 11/2 K is 5.2. -Lokelma 10g TID -HD 11/2 with low K bath -Renal diet  ESRD on HD TTS - dry weight 112.2 kg Wt this AM 119kg 11/2. He may require a separate dialysis schedule due to his Covid positive status. Will touch base with nephrology prior to discharge. -Nephro following - for current outpatient set up patient can d/c tomorrow 11/3 and is set up for HD in isolation at [facility]. Per nephro team he will have dialysis on Thursday (11/5) and Saturday (11/7), then will resume normal TTS schedule  After he has had 2 negative COVID tests (to be performed at [facility]) he can return to his regular HD seat at [facility]. Patient has already received this information from nephro team -Continue Aranesp 18mcg weekly per nephrology  Anemia of  chronic disease, stable Hemoglobin 9.0 this AM 11/2. -Nephrology following  -Patient on Aranesp 42mcg weekly.  HFrEF 40-45% as of 04/16/2019 CAD Assessed by cardiology. Likely demand ischemia.Echo 04/16/2019 shows EF 40-45%, mildly decreased function, mildly increased LVH, abnormalities in the inferior wall, inferior lateral wall and apical areas, LA severely dilated. -Cardiology following,appreciate recommendations -Continue Coreg, Statin and aspirin -Heparin gtt 11/1 - 11/3 (last heparin level slightly elevated, per pharm will decrease rate)  Elevated transaminases Most likely due to COVID vs recent Remdesivir administration. Asymptomatic. AST 42 >119>82, ALT 62>163>180. Noted on admission. Total bili elevated at 1.3. Platelets and INR wnl.Hep C Ab negative. -Monitor  Hypertension Normotensive overnight, 138/90 this AM 11/2. - Carvedilol 25 mg BID - Minoxidil 20 mg BID   OSA with use of CPAP Patient uses CPAP at night at home. Since patient is COVID+ cannot use CPAP while in hospital.  HLD -Continue atorvastatin  Diabetes CBG overnight 136>247. Patient also on steroid. Required 7 units insulin coverage in last 24 hours. -Continue Tradjenta 5 mg daily -Sensitive scale insulin coverage with meals -Monitor CBGs   Diabetic Neuropathy  -Continue gabapentin 300mg  TID   FEN/GI: Heart healthy diet PPx: Heparin gtt 11/1 - 11/3  Disposition: Plan to d/c after Heparin gtt completed 11/3, need to establish O2 and HD support prior to d/c.  Subjective:  Patient seen resting comfortably in bed, receiving HD with HD nurse at bedside. Nasal cannula is in place, states he is still struggling to breathe with ambulation and was lying down.  No other complaints or concerns this morning.  Objective: Temp:  [97.6 F (36.4 C)-98.1 F (36.7 C)] 97.6 F (36.4 C) (11/02 0400) Pulse Rate:  [  67-79] 67 (11/02 0400) Resp:  [16] 16 (11/01 0900) BP: (131-142)/(64-77) 139/76 (11/02  0400) SpO2:  [97 %-100 %] 98 % (11/02 0400) Physical Exam: General: No apparent distress, nontoxic appearing, very pleasant patient Cardiovascular: RRR, S1-S2 present, no murmurs appreciated Respiratory: CTA bilaterally, moving air well, normal work of breathing, nasal cannula at 2 L in place Abdomen: Normal bowel sounds appreciated Extremities: No edema, cyanosis or deformity appreciated  Laboratory: Recent Labs  Lab 04/16/19 0418 04/17/19 0413 04/18/19 0604  WBC 6.5 4.6 6.5  HGB 8.9* 9.5* 9.3*  HCT 25.7* 28.0* 27.2*  PLT 237 266 285   Recent Labs  Lab 04/14/19 0200  04/17/19 0413 04/18/19 0604 04/18/19 1753  NA 138   < > 137 136 137  K 4.3   < > 5.3* 6.0* 5.7*  CL 91*   < > 96* 95* 94*  CO2 27   < > 20* 22 24  BUN 64*   < > 106* 79* 91*  CREATININE 15.66*   < > 14.79* 10.76* 11.67*  CALCIUM 8.7*   < > 9.1 9.5 9.7  PROT 7.5  --  6.8 6.9  --   BILITOT 1.1  --  0.9 1.3*  --   ALKPHOS 52  --  47 50  --   ALT 62*  --  90* 163*  --   AST 42*  --  53* 119*  --   GLUCOSE 86   < > 150* 150* 220*   < > = values in this interval not displayed.    Imaging/Diagnostic Tests: No new imaging.  Daisy Floro, DO 04/19/2019, 6:33 AM PGY-2, [place] Intern pager: [phone], text pages welcome

## 2019-04-19 NOTE — Progress Notes (Signed)
Cardiology Progress Note  Patient ID: Troy Wells MRN: [id number] DOB: 27-Oct-1965 Date of Encounter: 04/19/2019  Primary Cardiologist: No primary care provider on file.  Subjective  No complaints of chest pain this morning.  Reports his breathing is doing well.  Review of telemetry shows normal sinus rhythm with heart rate in the 60-70 range.  Cardiac enzymes have trended down.  ROS:  All other ROS reviewed and negative. Pertinent positives noted in the HPI.     Inpatient Medications  Scheduled Meds: . aspirin  81 mg Oral Daily  . atorvastatin  80 mg Oral Daily  . carvedilol  25 mg Oral BID WC  . dexamethasone  6 mg Oral Daily  . gabapentin  300 mg Oral QHS  . [START ON 04/20/2019] heparin  6,000 Units Dialysis Once in dialysis  . insulin aspart  0-9 Units Subcutaneous TID WC  . linagliptin  5 mg Oral Daily  . minoxidil  20 mg Oral BID  . sodium zirconium cyclosilicate  10 g Oral TID   Continuous Infusions: . heparin 1,300 Units/hr (04/19/19 0423)   PRN Meds: acetaminophen   Vital Signs   Vitals:   04/19/19 0753 04/19/19 0800 04/19/19 0830 04/19/19 0900  BP: 116/62 123/64 121/69 115/69  Pulse: 67 63 70 69  Resp:      Temp:      TempSrc:      SpO2:   100%   Weight:      Height:       No intake or output data in the 24 hours ending 04/19/19 0931 Last 3 Weights 04/19/2019 04/17/2019 04/17/2019  Weight (lbs) 262 lb 5.6 oz 261 lb 0.4 oz 276 lb 7.3 oz  Weight (kg) 119 kg 118.4 kg 125.4 kg      Telemetry  Overnight telemetry shows normal sinus rhythm with heart rate in the 60-70 range, which I personally reviewed.   EKG demonstrates normal sinus rhythm with heart rate around 89, no acute ST-T changes to suggest ischemia or prior infarction  Physical Exam   Vitals:   04/19/19 0753 04/19/19 0800 04/19/19 0830 04/19/19 0900  BP: 116/62 123/64 121/69 115/69  Pulse: 67 63 70 69  Resp:      Temp:      TempSrc:      SpO2:   100%   Weight:      Height:       No  intake or output data in the 24 hours ending 04/19/19 0931  Last 3 Weights 04/19/2019 04/17/2019 04/17/2019  Weight (lbs) 262 lb 5.6 oz 261 lb 0.4 oz 276 lb 7.3 oz  Weight (kg) 119 kg 118.4 kg 125.4 kg    Body mass index is 35.58 kg/m.  General: Well nourished, well developed, in no acute distress Head: Atraumatic, normal size  Eyes: PEERLA, EOMI  Neck: Supple, no JVD Endocrine: No thryomegaly Cardiac: 3/6 systolic ejection murmur present Lungs: Clear to auscultation bilaterally, no wheezing, rhonchi or rales  Abd: Soft, nontender, no hepatomegaly  Ext: No edema, pulses 2+ Musculoskeletal: No deformities, BUE and BLE strength normal and equal Skin: Warm and dry, no rashes   Neuro: Alert and oriented to person, place, time, and situation, CNII-XII grossly intact, no focal deficits  Psych: Normal mood and affect   Labs  High Sensitivity Troponin:   Recent Labs  Lab 04/14/19 0200 04/14/19 0314 04/15/19 1115  TROPONINIHS 2,115* 1,705* 627*     Cardiac EnzymesNo results for input(s): TROPONINI in the last 168 hours. No results  for input(s): TROPIPOC in the last 168 hours.  Chemistry Recent Labs  Lab 04/17/19 0413 04/18/19 0604 04/18/19 1753 04/19/19 0644  NA 137 136 137 134*  K 5.3* 6.0* 5.7* 5.2*  CL 96* 95* 94* 93*  CO2 20* 22 24 21*  GLUCOSE 150* 150* 220* 132*  BUN 106* 79* 91* 105*  CREATININE 14.79* 10.76* 11.67* 13.05*  CALCIUM 9.1 9.5 9.7 9.2  PROT 6.8 6.9  --  6.9  ALBUMIN 3.2* 3.2*  --  3.1*  AST 53* 119*  --  82*  ALT 90* 163*  --  180*  ALKPHOS 47 50  --  52  BILITOT 0.9 1.3*  --  1.1  GFRNONAA 3* 5* 4* 4*  GFRAA 4* 6* 5* 4*  ANIONGAP 21* 19* 19* 20*    Hematology Recent Labs  Lab 04/17/19 0413 04/18/19 0604 04/19/19 0644  WBC 4.6 6.5 8.2  RBC 3.51* 3.44* 3.26*  HGB 9.5* 9.3* 9.0*  HCT 28.0* 27.2* 26.2*  MCV 79.8* 79.1* 80.4  MCH 27.1 27.0 27.6  MCHC 33.9 34.2 34.4  RDW 14.6 14.6 14.6  PLT 266 285 310   BNP Recent Labs  Lab 04/14/19 0200   BNP 1,317.6*    DDimer  Recent Labs  Lab 04/14/19 0200  DDIMER 1.67*     Radiology  No results found.  Cardiac Studies  TTE 04/16/2019  1. Left ventricular ejection fraction, by visual estimation, is 40 to 45%. The left ventricle has mild to moderately decreased function. There is moderately increased left ventricular hypertrophy.  2. Basal and mid inferior wall, basal and mid inferolateral wall, and apical lateral segment are abnormal.  3. Elevated left ventricular end-diastolic pressure.  4. Left ventricular diastolic parameters are consistent with Grade II diastolic dysfunction (pseudonormalization).  5. The left ventricle demonstrates regional wall motion abnormalities.  6. Global right ventricle has normal systolic function.The right ventricular size is normal. No increase in right ventricular wall thickness.  7. Left atrial size was severely dilated.  8. Right atrial size was moderately dilated.  9. The mitral valve is normal in structure. Mild mitral valve regurgitation. No evidence of mitral stenosis. 10. The tricuspid valve is normal in structure. Tricuspid valve regurgitation is not demonstrated. 11. The aortic valve is normal in structure. Aortic valve regurgitation is not visualized. No evidence of aortic valve sclerosis or stenosis. 12. There is Mild calcification of the aortic valve. 13. There is Mild thickening of the aortic valve. 14. The pulmonic valve was normal in structure. Pulmonic valve regurgitation is not visualized. 15. The inferior vena cava is normal in size with greater than 50% respiratory variability, suggesting right atrial pressure of 3 mmHg.  Patient Profile  Troy Wells is a 53 y.o. male with hypertension, ESRD on hemodialysis, diastolic heart failure, pulmonary hypertension, OSA on CPAP, morbid obesity, diabetes admitted for COVID-19 infection found to have elevated troponin that is asymptomatic.  Assessment & Plan  1.  Non-myocardial  infarction troponin elevation versus non-STEMI -He has no acute EKG changes and no symptoms suggest he is having an acute coronary syndrome.  His echocardiogram does show an ejection fraction of 45% with hypokinesis of the inferolateral wall.  He was started on medical therapy over the weekend and I agree with this.  I suspect this is likely all related to COVID-19 infection given its known propensity for cardiac involvement.  Given lack of symptoms and no EKG changes, there is no urgent need for cardiac catheterization.  I favor a  conservative approach in this gentleman with ESRD nonetheless. -Would plan to complete 48 hours of heparin drip and then stop -Continue aspirin, high intensity statin and carvedilol -We will plan to arrange a follow-up appointment for him in 1 month and we can decide on stress testing versus repeat echocardiogram at that time  [place] will sign off.   Medication Recommendations: Heparin drip for 48 hours, aspirin, statin, carvedilol Other recommendations (labs, testing, etc): Outpatient stress test Follow up as an outpatient: We will arrange follow-up in 1 month of discharge for outpatient stress test  For questions or updates, please contact [place] Please consult [email or web address] for contact info under   Signed, [place] T. Audie Box, [place]  04/19/2019 9:31 AM

## 2019-04-19 NOTE — Progress Notes (Signed)
SATURATION QUALIFICATIONS: (This note is used to comply with regulatory documentation for home oxygen)  Patient Saturations on Room Air at Rest = 97%  Patient Saturations on Room Air while Ambulating = 95%  Patient Saturations on 0 Liters of oxygen while Ambulating = 95%  Please briefly explain why patient needs home oxygen: Oxygen levels stayed in the mid 90's on room air while ambulating

## 2019-04-19 NOTE — Progress Notes (Signed)
Patient ID: Troy Wells, male   DOB: Jan 24, 1966, 53 y.o.   MRN: [id number] S: No events overnight. O:BP (!) 113/57   Pulse 69   Temp (!) 97.5 F (36.4 C) (Oral)   Resp 16   Ht 6' (1.829 m)   Wt 119 kg   SpO2 100%   BMI 35.58 kg/m   Intake/Output Summary (Last 24 hours) at 04/19/2019 1142 Last data filed at 04/19/2019 0945 Gross per 24 hour  Intake 240 ml  Output -  Net 240 ml   Intake/Output: No intake/output data recorded.  Intake/Output this shift:  Total I/O In: 240 [P.O.:240] Out: -  Weight change:  Physical exam: unable to complete due to COVID + status.  In order to preserve PPE equipment and to minimize exposure to providers.  Notes from other caregivers reviewed  Recent Labs  Lab 04/13/19 1116 04/14/19 0200 04/14/19 0419 04/15/19 1115 04/16/19 0418 04/17/19 0413 04/18/19 0604 04/18/19 1753 04/19/19 0644  NA 135 138 134* 136 140 137 136 137 134*  K 4.1 4.3 4.7 4.7 5.0 5.3* 6.0* 5.7* 5.2*  CL 90* 91*  --  90* 99 96* 95* 94* 93*  CO2 26 27  --  23 20* 20* 22 24 21*  GLUCOSE 95 86  --  170* 133* 150* 150* 220* 132*  BUN 48* 64*  --  106* 71* 106* 79* 91* 105*  CREATININE 13.63* 15.66*  --  18.59* 12.44* 14.79* 10.76* 11.67* 13.05*  ALBUMIN 3.4* 3.3*  --   --   --  3.2* 3.2*  --  3.1*  CALCIUM 9.0 8.7*  --  9.6 8.9 9.1 9.5 9.7 9.2  AST 46* 42*  --   --   --  53* 119*  --  82*  ALT 65* 62*  --   --   --  90* 163*  --  180*   Liver Function Tests: Recent Labs  Lab 04/17/19 0413 04/18/19 0604 04/19/19 0644  AST 53* 119* 82*  ALT 90* 163* 180*  ALKPHOS 47 50 52  BILITOT 0.9 1.3* 1.1  PROT 6.8 6.9 6.9  ALBUMIN 3.2* 3.2* 3.1*   Recent Labs  Lab 04/13/19 1116  LIPASE 37   No results for input(s): AMMONIA in the last 168 hours. CBC: Recent Labs  Lab 04/15/19 1115 04/16/19 0418 04/17/19 0413 04/18/19 0604 04/19/19 0644  WBC 6.3 6.5 4.6 6.5 8.2  HGB 9.8* 8.9* 9.5* 9.3* 9.0*  HCT 28.4* 25.7* 28.0* 27.2* 26.2*  MCV 79.8* 79.6* 79.8* 79.1* 80.4   PLT 235 237 266 285 310   Cardiac Enzymes: Recent Labs  Lab 04/14/19 0200  CKTOTAL 371   CBG: Recent Labs  Lab 04/17/19 2126 04/18/19 0758 04/18/19 1156 04/18/19 1539 04/18/19 2019  GLUCAP 229* 136* 190* 247* 199*    Iron Studies: No results for input(s): IRON, TIBC, TRANSFERRIN, FERRITIN in the last 72 hours. Studies/Results: No results found. Marland Kitchen aspirin  81 mg Oral Daily  . atorvastatin  80 mg Oral Daily  . carvedilol  25 mg Oral BID WC  . dexamethasone  6 mg Oral Daily  . gabapentin  300 mg Oral QHS  . [START ON 04/20/2019] heparin  6,000 Units Dialysis Once in dialysis  . insulin aspart  0-9 Units Subcutaneous TID WC  . linagliptin  5 mg Oral Daily  . minoxidil  20 mg Oral BID  . sodium zirconium cyclosilicate  10 g Oral TID    BMET    Component Value Date/Time  NA 134 (L) 04/19/2019 0644   K 5.2 (H) 04/19/2019 0644   CL 93 (L) 04/19/2019 0644   CO2 21 (L) 04/19/2019 0644   GLUCOSE 132 (H) 04/19/2019 0644   BUN 105 (H) 04/19/2019 0644   CREATININE 13.05 (H) 04/19/2019 0644   CREATININE 9.06 (H) 08/31/2015 1041   CALCIUM 9.2 04/19/2019 0644   GFRNONAA 4 (L) 04/19/2019 0644   GFRNONAA 6 (L) 08/31/2015 1041   GFRAA 4 (L) 04/19/2019 0644   GFRAA 7 (L) 08/31/2015 1041   CBC    Component Value Date/Time   WBC 8.2 04/19/2019 0644   RBC 3.26 (L) 04/19/2019 0644   HGB 9.0 (L) 04/19/2019 0644   HCT 26.2 (L) 04/19/2019 0644   PLT 310 04/19/2019 0644   MCV 80.4 04/19/2019 0644   MCH 27.6 04/19/2019 0644   MCHC 34.4 04/19/2019 0644   RDW 14.6 04/19/2019 0644   [facility]pt HD:[facility]  4.5h 450/800 121.5kg 2/2 bath P4 Hep 60000+ 2544midrun LUA AVF + interposition Artegraft 03/2018 - last HD 10/26, under edw - mircera 75 q2, last 10/16 - calc 1.5 ug tiw - home sensipar 90 qd/ velphoro 2ac  Assessment/Plan:  1. COVID+ PNA with hypoxemia- still requiring oxygen via [place].  Completed 5 days of remdesivir.  Titrate oxygen asa able and decadron for total of  10 days (complete on 04/22/19). 2. Elevated troponin- on IV heparin, asa, statin, coreg.  Presumably due to demand ischemia but with new wall motion abnormality on ECHO and inf/lat hypokinesis and EF 40-45%.  Cardiology following and recommends outpatient follow up and repeat echo. 3. ESRD- HD today but will need to change to TTS schedule for GOC covid shift when stable for discharge 4. HTN- stable 5. Volume- below edw 6. Hyperkalemia- on HD today. 7. DM per primary  8. Anemia of CKD- cont with esa and follow.  Donetta Potts, MD [employer] [phone]

## 2019-04-19 NOTE — Progress Notes (Signed)
Renal Navigator spoke with patient regarding OP HD isolation shift Troy Wells 12:00pm with 11:45am arrival time). He states he will either drive himself or his daughter will be able to provide transportation. Per attending, plan is for discharge tomorrow. Patient had extra HD today for K+/solute control per Dr. Jonnie Finner note. Renal Navigator spoke with Dr. Marval Regal, who states patient can resume HD on Thursday, 11/5 if discharged tomorrow. Renal Navigator informed patient and attending and alerted OP HD clinic/[name] to expect patient on 04/22/19. Renal Navigator will continue to follow through discharge.  Alphonzo Cruise, [place] Renal Navigator [phone]

## 2019-04-19 NOTE — Progress Notes (Signed)
ANTICOAGULATION CONSULT NOTE   Pharmacy Consult for heparin Indication: chest pain/ACS  No Active Allergies  Patient Measurements: Height: 6' (182.9 cm) Weight: 260 lb 2.3 oz (118 kg) IBW/kg (Calculated) : 77.6 HEPARIN DW (KG): 103.3  Vital Signs: Temp: 97.8 F (36.6 C) (11/02 1805) Temp Source: Oral (11/02 1805) BP: 148/78 (11/02 1805) Pulse Rate: 81 (11/02 1805)  Labs: Recent Labs    04/17/19 0413 04/18/19 0604 04/18/19 1753 04/19/19 0644 04/19/19 1752  HGB 9.5* 9.3*  --  9.0*  --   HCT 28.0* 27.2*  --  26.2*  --   PLT 266 285  --  310  --   HEPARINUNFRC  --   --  0.45 0.81* 0.86*  CREATININE 14.79* 10.76* 11.67* 13.05*  --     Estimated Creatinine Clearance: 8.6 mL/min (A) (by C-G formula based on SCr of 13.05 mg/dL (H)).   Medical History: Past Medical History:  Diagnosis Date  . CARDIAC ARREST 11/01/2009   Qualifier: Diagnosis of  By: Selena Batten CMA, Jewel    . Colon polyps 01/02/2016   Colonoscopy July 2017 - One 3 mm polyp in the transverse colon, removed with a cold biopsy forceps. Resected and retrieved. - One 3 mm polyp in the rectum, removed with a cold biopsy forceps. Resected and retrieved. - Diverticulosis in the entire examined colon. - Non-bleeding internal hemorrhoids. - The examination was otherwise normal. - Significant looping which prolonged cecal    . Coronary artery disease 08/15/2009   with AMI  . Dialysis patient [facility]) 09-27-2014   mon, wed, and fri  . Diverticulosis of colon without hemorrhage 01/02/2016   Colonoscopy July 2017 - One 3 mm polyp in the transverse colon, removed with a cold biopsy forceps. Resected and retrieved. - One 3 mm polyp in the rectum, removed with a cold biopsy forceps. Resected and retrieved. - Diverticulosis in the entire examined colon. - Non-bleeding internal hemorrhoids. - The examination was otherwise normal. - Significant looping which prolonged cecal    . DM (diabetes mellitus), type 2 with renal  complications ([place]) 11/01/6158  . Essential hypertension 11/01/2009   Qualifier: Diagnosis of  By: Selena Batten CMA, Jewel    . Hyperlipidemia   . Hypertension   . Mitral valve regurgitation 09/19/2015   Echo 09/2015   . OSA treated with BiPAP 06/17/2010  . Sleep apnea    cpap nightly     Assessment: 53 y.o. 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 Assessed by cardiology for evaluation ofelevated troponin (demand ischemia in setting of COVID infection vs ACS) and recommend treating as NSTEMI with heparin infusion for 48 hours. Pharmacy has been consulted to dose heparin infusion.  PM update: Heparin level remains above goal at 0.86 despite decreasing rate to 1150 units/hr. No bleeding reported. Received Heparin in dialysis. Patient's CBC is stable with Hgb low normal at 9, platelets 310. No prior to admission anticoagulation.   Goal of Therapy:  Heparin level 0.3-0.7 units/ml Monitor platelets by anticoagulation protocol: Yes   Plan:  Decrease heparin infusion to 900 units/hr Check 8h HL Daily CBC and HL while on heparin Monitor for s/sx bleeding  Sloan Leiter, PharmD, BCPS, BCCCP Clinical Pharmacist Clinical phone 04/19/2019 until 10:30P - #[phone] Please refer to [facility] for [place] numbers  04/19/2019   7:10 PM

## 2019-04-20 LAB — COMPREHENSIVE METABOLIC PANEL
ALT: 198 U/L — ABNORMAL HIGH (ref 0–44)
AST: 85 U/L — ABNORMAL HIGH (ref 15–41)
Albumin: 3.2 g/dL — ABNORMAL LOW (ref 3.5–5.0)
Alkaline Phosphatase: 50 U/L (ref 38–126)
Anion gap: 18 — ABNORMAL HIGH (ref 5–15)
BUN: 73 mg/dL — ABNORMAL HIGH (ref 6–20)
CO2: 25 mmol/L (ref 22–32)
Calcium: 9.4 mg/dL (ref 8.9–10.3)
Chloride: 94 mmol/L — ABNORMAL LOW (ref 98–111)
Creatinine, Ser: 9.69 mg/dL — ABNORMAL HIGH (ref 0.61–1.24)
GFR calc Af Amer: 6 mL/min — ABNORMAL LOW (ref >60)
GFR calc non Af Amer: 5 mL/min — ABNORMAL LOW (ref >60)
Glucose, Bld: 138 mg/dL — ABNORMAL HIGH (ref 70–99)
Potassium: 4.3 mmol/L (ref 3.5–5.1)
Sodium: 137 mmol/L (ref 135–145)
Total Bilirubin: 1 mg/dL (ref 0.3–1.2)
Total Protein: 6.6 g/dL (ref 6.5–8.1)

## 2019-04-20 LAB — GLUCOSE, CAPILLARY
Glucose-Capillary: 107 mg/dL — ABNORMAL HIGH (ref 70–99)
Glucose-Capillary: 177 mg/dL — ABNORMAL HIGH (ref 70–99)

## 2019-04-20 LAB — CBC
HCT: 26.6 % — ABNORMAL LOW (ref 39.0–52.0)
Hemoglobin: 9.2 g/dL — ABNORMAL LOW (ref 13.0–17.0)
MCH: 27.3 pg (ref 26.0–34.0)
MCHC: 34.6 g/dL (ref 30.0–36.0)
MCV: 78.9 fL — ABNORMAL LOW (ref 80.0–100.0)
Platelets: 343 10*3/uL (ref 150–400)
RBC: 3.37 MIL/uL — ABNORMAL LOW (ref 4.22–5.81)
RDW: 14.6 % (ref 11.5–15.5)
WBC: 7.8 10*3/uL (ref 4.0–10.5)
nRBC: 0.8 % — ABNORMAL HIGH (ref 0.0–0.2)

## 2019-04-20 LAB — HEPARIN LEVEL (UNFRACTIONATED): Heparin Unfractionated: 0.67 IU/mL (ref 0.30–0.70)

## 2019-04-20 MED ORDER — ALBUTEROL SULFATE HFA 108 (90 BASE) MCG/ACT IN AERS: 1.0000 | INHALATION_SPRAY | Freq: Four times a day (QID) | RESPIRATORY_TRACT | 0 refills | Status: DC | PRN

## 2019-04-20 MED ORDER — LINAGLIPTIN 5 MG PO TABS: 5.0000 mg | ORAL_TABLET | Freq: Every day | ORAL | 0 refills | Status: DC

## 2019-04-20 MED ORDER — ATORVASTATIN CALCIUM 80 MG PO TABS: 80.0000 mg | ORAL_TABLET | Freq: Every day | ORAL | 0 refills | Status: DC

## 2019-04-20 MED ORDER — DEXAMETHASONE 6 MG PO TABS: 6.0000 mg | ORAL_TABLET | Freq: Every day | ORAL | 0 refills | Status: AC

## 2019-04-20 NOTE — Progress Notes (Signed)
Family Medicine Teaching Service Daily Progress Note Intern Pager: [phone]  Patient name: Troy Wells Medical record number: [id number] Date of birth: 1965-11-30 Age: 53 y.o. Gender: male  Primary Care Provider: [name], MD Consultants: Nephrology Code Status: Full  Pt Overview and Major Events to Date:  04/14/2019 - Admitted for dyspnea 2/2 COVID  Assessment and Plan: Troy Harrisis a 53 y.o.malepresenting with SOB, chills and diarrhea for 3 days, found to be COVID+. PMH is significant forESRDMWF HD,OSA, HTN, HLD, CHFpEF,pHTN,DM, diabetic neuropathy.   Acute Hypoxic Respiratory Failure likely 2/2 COVID, improved Maintain saturation at rest in the mid 90s as well. Also Patient ambulated on room air and maintain saturation in the 90s. Does not meet requirement to have oxygen at home.  Patient did have oxygen use overnight, this is likely replacing patient's CPAP since he cannot use this as he is Covid positive in the hospital. Completed 5 days of remdesvir.  -CCM signed off -Continue Decadron 6 mg (10/27-11/5) (10 days) - stable for discharge today after heparin gtt finishes  Hyperkalemia: Resolved.  Potassium today is 4.3   ESRD on HD TTS - dry weight 112.2 kg Wt this AM 118kg 11/3. Nephro following -they agree that patient can d/c today and have set up for HD in isolation at [facility]. Per nephro team he will have dialysis on Thursday (11/5) and Saturday (11/7), then will resume normal TTS schedule  After he has had 2 negative COVID tests (to be performed at [facility]) he can return to his regular HD seat at [facility]. Patient has already received this information from nephro team -Continue Aranesp 54mcg weekly per nephrology  Anemia of chronic disease, stable Hemoglobin 9.2 this AM 11/3. -Nephrology following  -Patient on Aranesp 72mcg weekly.  HFrEF 40-45% as of 04/16/2019 CAD Assessed by cardiology. Likely demand  ischemia.Echo 04/16/2019 shows EF 40-45%, mildly decreased function, mildly increased LVH, abnormalities in the inferior wall, inferior lateral wall and apical areas, LA severely dilated. -Cardiology following,appreciate recommendations -Continue Coreg, Statin and aspirin -Heparin gtt 11/1 - 11/3, will stop at 9:00 AM - discharge after heparin finishes  Elevated transaminases, stable Most likely due to COVID vs recent Remdesivir administration. Asymptomatic. AST 42 >119>82>85, ALT 62>163>180>198. Noted on admission. Total bili elevated at 1.3. Platelets and INR wnl.Hep C Ab negative. -Monitor -recommend following in the outpatient setting  Hypertension Normotensive overnight, 138/90 this AM 11/2. - Carvedilol 25 mg BID - Minoxidil 20 mg BID   OSA with use of CPAP Patient uses CPAP at night at home. Since patient is COVID+ cannot use CPAP while in hospital.  HLD -Continue atorvastatin -consider holding if LFTs do not normalize in outpatient setting  Diabetes CBG past 24 hours 134-168. Patient also on steroid. Required 4 units insulin coverage in last 24 hours. -Continue Tradjenta 5 mg daily -Sensitive scale insulin coverage with meals -Monitor CBGs   Diabetic Neuropathy  -Continue gabapentin 300mg  TID  FEN/GI: Heart healthy diet PPx: Heparin gtt 11/1 - 11/3  Disposition: Plan to d/c after Heparin gtt completed   Subjective:  Patient states that he is doing well.  Denies any trouble breathing or chest pain.  I asked him if he was ready for discharge, he says that he is not, says that he is joking.  No other complaints.  Objective: Temp:  [97.5 F (36.4 C)-98.2 F (36.8 C)] 98.2 F (36.8 C) (11/02 2309) Pulse Rate:  [60-87] 87 (11/02 2309) Resp:  [16] 16 (11/02 2309) BP: (107-148)/(57-90) 139/82 (  11/02 2309) SpO2:  [96 %-100 %] 96 % (11/02 2309) FiO2 (%):  [97 %] 97 % (11/02 0830) Weight:  [625 kg-119 kg] 118 kg (11/02 1051) Physical Exam: General: No  apparent distress, nontoxic appearing, very pleasant patient Cardiovascular: RRR, S1-S2 present, no murmurs appreciated Respiratory: CTA bilaterally, moving air well, normal work of breathing, SRA abdomen: Normal bowel sounds appreciated Extremities: No edema, cyanosis or deformity appreciated  Laboratory: Recent Labs  Lab 04/18/19 0604 04/19/19 0644 04/20/19 0315  WBC 6.5 8.2 7.8  HGB 9.3* 9.0* 9.2*  HCT 27.2* 26.2* 26.6*  PLT 285 310 343   Recent Labs  Lab 04/18/19 0604 04/18/19 1753 04/19/19 0644 04/20/19 0315  NA 136 137 134* 137  K 6.0* 5.7* 5.2* 4.3  CL 95* 94* 93* 94*  CO2 22 24 21* 25  BUN 79* 91* 105* 73*  CREATININE 10.76* 11.67* 13.05* 9.69*  CALCIUM 9.5 9.7 9.2 9.4  PROT 6.9  --  6.9 6.6  BILITOT 1.3*  --  1.1 1.0  ALKPHOS 50  --  52 50  ALT 163*  --  180* 198*  AST 119*  --  82* 85*  GLUCOSE 150* 220* 132* 138*    Imaging/Diagnostic Tests: No new imaging.  Bonnita Hollow, MD 04/20/2019, 6:15 AM PGY-3, [place] Intern pager: [phone], text pages welcome

## 2019-04-20 NOTE — Progress Notes (Signed)
ANTICOAGULATION CONSULT NOTE - Follow Up Consult  Pharmacy Consult for heparin Indication: chest pain/ACS  Labs: Recent Labs    04/18/19 0604  04/18/19 1753 04/19/19 0644 04/19/19 1752 04/20/19 0315  HGB 9.3*  --   --  9.0*  --  9.2*  HCT 27.2*  --   --  26.2*  --  26.6*  PLT 285  --   --  310  --  343  HEPARINUNFRC  --    < > 0.45 0.81* 0.86* 0.67  CREATININE 10.76*  --  11.67* 13.05*  --   --    < > = values in this interval not displayed.    Assessment/Plan:  53yo male therapeutic on heparin after rate change. Will continue gtt at current rate and confirm plan to d/c heparin this am after 48h of therapy.   Troy Wells, PharmD, BCPS  04/20/2019,3:56 AM

## 2019-04-20 NOTE — Discharge Instructions (Signed)
Because of your COVID-19 infection, please self quarantine until 11/6, you are fever free for 24 hours.medications, and you were generally improving.  This is 10 days after their onset of illness.  COVID-19 COVID-19 is a respiratory infection that is caused by a virus called severe acute respiratory syndrome coronavirus 2 (SARS-CoV-2). The disease is also known as coronavirus disease or novel coronavirus. In some people, the virus may not cause any symptoms. In others, it may cause a serious infection. The infection can get worse quickly and can lead to complications, such as:  Pneumonia, or infection of the lungs.  Acute respiratory distress syndrome or ARDS. This is fluid build-up in the lungs.  Acute respiratory failure. This is a condition in which there is not enough oxygen passing from the lungs to the body.  Sepsis or septic shock. This is a serious bodily reaction to an infection.  Blood clotting problems.  Secondary infections due to bacteria or fungus. The virus that causes COVID-19 is contagious. This means that it can spread from person to person through droplets from coughs and sneezes (respiratory secretions). What are the causes? This illness is caused by a virus. You may catch the virus by:  Breathing in droplets from an infected person's cough or sneeze.  Touching something, like a table or a doorknob, that was exposed to the virus (contaminated) and then touching your mouth, nose, or eyes. What increases the risk? Risk for infection You are more likely to be infected with this virus if you:  Live in or travel to an area with a COVID-19 outbreak.  Come in contact with a sick person who recently traveled to an area with a COVID-19 outbreak.  Provide care for or live with a person who is infected with COVID-19. Risk for serious illness You are more likely to become seriously ill from the virus if you:  Are 53 years of age or older.  Have a long-term disease that  lowers your body's ability to fight infection (immunocompromised).  Live in a nursing home or long-term care facility.  Have a long-term (chronic) disease such as: ? Chronic lung disease, including chronic obstructive pulmonary disease or asthma ? Heart disease. ? Diabetes. ? Chronic kidney disease. ? Liver disease.  Are obese. What are the signs or symptoms? Symptoms of this condition can range from mild to severe. Symptoms may appear any time from 2 to 14 days after being exposed to the virus. They include:  A fever.  A cough.  Difficulty breathing.  Chills.  Muscle pains.  A sore throat.  Loss of taste or smell. Some people may also have stomach problems, such as nausea, vomiting, or diarrhea. Other people may not have any symptoms of COVID-19. How is this diagnosed? This condition may be diagnosed based on:  Your signs and symptoms, especially if: ? You live in an area with a COVID-19 outbreak. ? You recently traveled to or from an area where the virus is common. ? You provide care for or live with a person who was diagnosed with COVID-19.  A physical exam.  Lab tests, which may include: ? A nasal swab to take a sample of fluid from your nose. ? A throat swab to take a sample of fluid from your throat. ? A sample of mucus from your lungs (sputum). ? Blood tests.  Imaging tests, which may include, X-rays, CT scan, or ultrasound. How is this treated? At present, there is no medicine to treat COVID-19. Medicines that treat  other diseases are being used on a trial basis to see if they are effective against COVID-19. Your health care provider will talk with you about ways to treat your symptoms. For most people, the infection is mild and can be managed at home with rest, fluids, and over-the-counter medicines. Treatment for a serious infection usually takes places in a hospital intensive care unit (ICU). It may include one or more of the following treatments. These  treatments are given until your symptoms improve.  Receiving fluids and medicines through an IV.  Supplemental oxygen. Extra oxygen is given through a tube in the nose, a face mask, or a hood.  Positioning you to lie on your stomach (prone position). This makes it easier for oxygen to get into the lungs.  Continuous positive airway pressure (CPAP) or bi-level positive airway pressure (BPAP) machine. This treatment uses mild air pressure to keep the airways open. A tube that is connected to a motor delivers oxygen to the body.  Ventilator. This treatment moves air into and out of the lungs by using a tube that is placed in your windpipe.  Tracheostomy. This is a procedure to create a hole in the neck so that a breathing tube can be inserted.  Extracorporeal membrane oxygenation (ECMO). This procedure gives the lungs a chance to recover by taking over the functions of the heart and lungs. It supplies oxygen to the body and removes carbon dioxide. Follow these instructions at home: Lifestyle  If you are sick, stay home except to get medical care. Your health care provider will tell you how long to stay home. Call your health care provider before you go for medical care.  Rest at home as told by your health care provider.  Do not use any products that contain nicotine or tobacco, such as cigarettes, e-cigarettes, and chewing tobacco. If you need help quitting, ask your health care provider.  Return to your normal activities as told by your health care provider. Ask your health care provider what activities are safe for you. General instructions  Take over-the-counter and prescription medicines only as told by your health care provider.  Drink enough fluid to keep your urine pale yellow.  Keep all follow-up visits as told by your health care provider. This is important. How is this prevented?  There is no vaccine to help prevent COVID-19 infection. However, there are steps you can take  to protect yourself and others from this virus. To protect yourself:   Do not travel to areas where COVID-19 is a risk. The areas where COVID-19 is reported change often. To identify high-risk areas and travel restrictions, check the CDC travel website: FatFares.com.br  If you live in, or must travel to, an area where COVID-19 is a risk, take precautions to avoid infection. ? Stay away from people who are sick. ? Wash your hands often with soap and water for 20 seconds. If soap and water are not available, use an alcohol-based hand sanitizer. ? Avoid touching your mouth, face, eyes, or nose. ? Avoid going out in public, follow guidance from your state and local health authorities. ? If you must go out in public, wear a cloth face covering or face mask. ? Disinfect objects and surfaces that are frequently touched every day. This may include:  Counters and tables.  Doorknobs and light switches.  Sinks and faucets.  Electronics, such as phones, remote controls, keyboards, computers, and tablets. To protect others: If you have symptoms of COVID-19, take steps to  prevent the virus from spreading to others.  If you think you have a COVID-19 infection, contact your health care provider right away. Tell your health care team that you think you may have a COVID-19 infection.  Stay home. Leave your house only to seek medical care. Do not use public transport.  Do not travel while you are sick.  Wash your hands often with soap and water for 20 seconds. If soap and water are not available, use alcohol-based hand sanitizer.  Stay away from other members of your household. Let healthy household members care for children and pets, if possible. If you have to care for children or pets, wash your hands often and wear a mask. If possible, stay in your own room, separate from others. Use a different bathroom.  Make sure that all people in your household wash their hands well and  often.  Cough or sneeze into a tissue or your sleeve or elbow. Do not cough or sneeze into your hand or into the air.  Wear a cloth face covering or face mask. Where to find more information  Centers for Disease Control and Prevention: PurpleGadgets.be  World Health Organization: https://www.castaneda.info/ Contact a health care provider if:  You live in or have traveled to an area where COVID-19 is a risk and you have symptoms of the infection.  You have had contact with someone who has COVID-19 and you have symptoms of the infection. Get help right away if:  You have trouble breathing.  You have pain or pressure in your chest.  You have confusion.  You have bluish lips and fingernails.  You have difficulty waking from sleep.  You have symptoms that get worse. These symptoms may represent a serious problem that is an emergency. Do not wait to see if the symptoms will go away. Get medical help right away. Call your local emergency services (911 in the [place] Do not drive yourself to the hospital. Let the emergency medical personnel know if you think you have COVID-19. Summary  COVID-19 is a respiratory infection that is caused by a virus. It is also known as coronavirus disease or novel coronavirus. It can cause serious infections, such as pneumonia, acute respiratory distress syndrome, acute respiratory failure, or sepsis.  The virus that causes COVID-19 is contagious. This means that it can spread from person to person through droplets from coughs and sneezes.  You are more likely to develop a serious illness if you are 58 years of age or older, have a weak immunity, live in a nursing home, or have chronic disease.  There is no medicine to treat COVID-19. Your health care provider will talk with you about ways to treat your symptoms.  Take steps to protect yourself and others from infection. Wash your hands often and disinfect objects and  surfaces that are frequently touched every day. Stay away from people who are sick and wear a mask if you are sick. This information is not intended to replace advice given to you by your health care provider. Make sure you discuss any questions you have with your health care provider. Document Released: 07/09/2018 Document Revised: 10/29/2018 Document Reviewed: 07/09/2018 Elsevier Patient Education  2020 [employer].

## 2019-04-21 ENCOUNTER — Telehealth: Payer: Self-pay | Admitting: Physician Assistant

## 2019-04-21 NOTE — Telephone Encounter (Signed)
Transition of care contact from inpatient facility  Date of discharge: 04/20/2019 Date of contact: 04/21/2019 Method of contact: Phone  Patient contacted to discuss transition of care from recent impatient hospitalization. Patient was admitted to [facility] from 04/13/2019-04/17/2019 with COViD 19 pneumonia. Medications changes were reviewed. Patient reports he is feeling well, SOB is improving and he does not need any help completing his ADLs. Patient will follow up with outpatient dialysis center on 04/22/2019. He has transportation and is aware of his start time. Other follow up needed includes cardiology for possible nuclear stress test in 1 month.

## 2019-04-22 ENCOUNTER — Telehealth: Payer: Self-pay | Admitting: *Deleted

## 2019-04-22 DIAGNOSIS — N2581 Secondary hyperparathyroidism of renal origin: Secondary | ICD-10-CM | POA: Diagnosis not present

## 2019-04-22 DIAGNOSIS — U071 COVID-19: Secondary | ICD-10-CM | POA: Diagnosis not present

## 2019-04-22 DIAGNOSIS — Z992 Dependence on renal dialysis: Secondary | ICD-10-CM | POA: Diagnosis not present

## 2019-04-22 DIAGNOSIS — N186 End stage renal disease: Secondary | ICD-10-CM | POA: Diagnosis not present

## 2019-04-22 DIAGNOSIS — D631 Anemia in chronic kidney disease: Secondary | ICD-10-CM | POA: Diagnosis not present

## 2019-04-22 NOTE — Telephone Encounter (Signed)
Hello RN team,  It looks like he will require prior authorization. I will also check with pharmacy which is best for him given that he has ESRD on HD.  Please get him on a virtual schedule to see me or anyone available today or tomorrow to review his meds and make appropriate adjustment.  I copied Laurey Arrow on this for recommendation. Thanks.

## 2019-04-22 NOTE — Telephone Encounter (Signed)
Pt was Rx'd Tradjenta but it will cost him $500 and he cant afford that.  Is there another option?  To PCP. Christen Bame, CMA

## 2019-04-22 NOTE — Telephone Encounter (Signed)
Thanks Pete. 

## 2019-04-22 NOTE — Telephone Encounter (Signed)
DPP4s are expensive (no generics available) at this time.   Patient reports that he lost his medication drug insurance coverage and is waiting to reestablish.   We will need to reevaluate / hold until insurance coverage is available.

## 2019-04-23 ENCOUNTER — Telehealth: Payer: Self-pay | Admitting: Pharmacist

## 2019-04-23 ENCOUNTER — Telehealth (INDEPENDENT_AMBULATORY_CARE_PROVIDER_SITE_OTHER): Payer: Medicare Other | Admitting: Family Medicine

## 2019-04-23 ENCOUNTER — Other Ambulatory Visit: Payer: Self-pay

## 2019-04-23 DIAGNOSIS — R7401 Elevation of levels of liver transaminase levels: Secondary | ICD-10-CM

## 2019-04-23 DIAGNOSIS — U071 COVID-19: Secondary | ICD-10-CM | POA: Diagnosis not present

## 2019-04-23 NOTE — Progress Notes (Addendum)
[place] Telemedicine Visit  Patient consented to have virtual visit. Method of visit: Video was attempted, but technology challenges prevented patient from using video, so visit was conducted via telephone.  Encounter participants: Patient: Troy Wells - located at Home Provider: Danna Hefty - located at [facility] Others (if applicable): None  Chief Complaint: Hospital Follow up  HPI: Patient recently hospitalized on 10/28 for COVID associated pneumonia. He was discharged on 11/3. He was treated with Azithromycin, ceftriaxone, Remdesivir, and 10 day course of Decadron. He notes his breathing is improved. He still gets winded with exertion but this is improving.  He has some occasional wheezing that improves with Albuterol inhaler. Patient has been attending regularly scheduled dialysis at a COVID friendly center. He last dialysis was yesterday and he is scheduled for next dialysis tomorrow. He has been able to make it to dialysis without issue. He is required to have two negative COVID tests prior to being allowed to return to normal dialysis center. He notes he was tested yesterday but is unsure of his results. He notes he is eating and drinking well. He notes he is still having small amount of hemoptysis but it is much improved since admission. Denies any abdominal pain, nausea, vomiting, constipation, diarrhea. Denies any chest pain, fevers, chills.   ROS: per HPI  Pertinent PMHx: COVID positive on 10/28  Exam:  Respiratory: Speaking in full sentences   Assessment/Plan:  COVID-19 virus detected Patient symptoms are overall improving. Hemoptysis improving as well. No red flag symptoms to warrant in-person evaluation. Discussed hypercoagulable risk given COVID infection and symptoms to look out for such as chest pain, worsening SOB, or unilateral LE edema. He understood and agreed to plan. Patient's dialysis center to conduct repeat testing. Discussed  quarantine guidelines per CDC which indicates quarantine for 14 days from diagnosis or 72 hours after last fever. Given patient has been afebrile, this would make his quarantine end on 04/28/19. He was instructed to wash high touch surfaces frequently, wear a mask, and avoid going out in public until quarantine is over. He agreed to this.  - Patient's insurance not effective until 05/18/19 - Patient instructed to follow up with PCP at earliest convenience once insurance effective - Red flag symptoms and ED precautions discussed at length. Patient understood and agreed to plan  Elevated transaminase measurement Was noted to have elevated transaminases while hospitalized. Unclear etiology but felt to be secondary to Remdesivir vs COVID. Patient denied any abdominal discomfort.  - Recommend repeat labs at follow up with PCP in December   Time spent during visit with patient: 14 minutes  Mina Marble, [place], PGY2 04/23/2019

## 2019-04-23 NOTE — Telephone Encounter (Signed)
Thanks Jackson,  I will reach out to the staff to help him schedule f/u with me.

## 2019-04-23 NOTE — Assessment & Plan Note (Signed)
Patient symptoms are overall improving. Hemoptysis improving as well. No red flag symptoms to warrant in-person evaluation. Discussed hypercoagulable risk given COVID infection and symptoms to look out for such as chest pain, worsening SOB, or unilateral LE edema. He understood and agreed to plan. Patient's dialysis center to conduct repeat testing. Discussed quarantine guidelines per CDC which indicates quarantine for 14 days from diagnosis or 72 hours after last fever. Given patient has been afebrile, this would make his quarantine end on 04/28/19. He was instructed to wash high touch surfaces frequently, wear a mask, and avoid going out in public until quarantine is over. He agreed to this.  - Patient's insurance not effective until 05/18/19 - Patient instructed to follow up with PCP at earliest convenience once insurance effective - Red flag symptoms and ED precautions discussed at length. Patient understood and agreed to plan

## 2019-04-23 NOTE — Assessment & Plan Note (Signed)
Was noted to have elevated transaminases while hospitalized. Unclear etiology but felt to be secondary to Remdesivir vs COVID. Patient denied any abdominal discomfort.  - Recommend repeat labs at follow up with PCP in December

## 2019-04-23 NOTE — Telephone Encounter (Signed)
Called Pt 11/6 about patient's prescription drug insurance coverage status. Troy Wells reports he called his insurance company and notified him that his insurance will not resume coverage until December.  Spoke with Dr. Gwendlyn Deutscher about the difficultly of affording Januvia and Tradjenta without insurance. GoodRx prices are ~$475.00. After discussion with PCP, it was decided to have Troy Wells take Glipizide 2.5 mg daily (half of 5 mg tablets). This dose is a change from glipizide 5 mg that the patient used to take.  Pt instructed on monitor for signs/symptoms of hypoglycemia and how to manage such episodes if they were to occur.  Patient verbalized understanding of plan. Will revisit therapy with DPP4 once insurance coverage begins.  Sherren Kerns, PharmD PGY1 Acute Care Pharmacy Resident

## 2019-04-24 DIAGNOSIS — N2581 Secondary hyperparathyroidism of renal origin: Secondary | ICD-10-CM | POA: Diagnosis not present

## 2019-04-24 DIAGNOSIS — Z992 Dependence on renal dialysis: Secondary | ICD-10-CM | POA: Diagnosis not present

## 2019-04-24 DIAGNOSIS — D631 Anemia in chronic kidney disease: Secondary | ICD-10-CM | POA: Diagnosis not present

## 2019-04-24 DIAGNOSIS — N186 End stage renal disease: Secondary | ICD-10-CM | POA: Diagnosis not present

## 2019-04-26 NOTE — Telephone Encounter (Signed)
Nevermind, looks like it was taken care of in a separate encounter.  Christen Bame, CMA

## 2019-04-26 NOTE — Telephone Encounter (Signed)
Please contact the patient and confirm that he is only on Glipizide 2.5 mg qd for DM as discussed. (Pharmacy called him regarding that).  Then help in schedule in person f/u with me in the next 1-2 weeks.

## 2019-04-26 NOTE — Telephone Encounter (Signed)
LMOVM for callback. Maxtyn Nuzum, CMA  

## 2019-04-26 NOTE — Telephone Encounter (Signed)
Do I still need to make him an appt?Christen Bame, CMA

## 2019-04-27 ENCOUNTER — Inpatient Hospital Stay (HOSPITAL_COMMUNITY): Payer: Medicare Other

## 2019-04-27 ENCOUNTER — Other Ambulatory Visit: Payer: Self-pay

## 2019-04-27 ENCOUNTER — Inpatient Hospital Stay (HOSPITAL_COMMUNITY)
Admission: EM | Admit: 2019-04-27 | Discharge: 2019-06-16 | DRG: 207 | Disposition: A | Discharge status: Skilled Nursing Facility | Payer: Medicare Other | Attending: Family Medicine | Admitting: Family Medicine

## 2019-04-27 ENCOUNTER — Encounter (HOSPITAL_COMMUNITY): Payer: Self-pay | Admitting: Emergency Medicine

## 2019-04-27 ENCOUNTER — Other Ambulatory Visit: Payer: Self-pay | Admitting: Family Medicine

## 2019-04-27 ENCOUNTER — Emergency Department (HOSPITAL_COMMUNITY): Payer: Medicare Other

## 2019-04-27 DIAGNOSIS — D72825 Bandemia: Secondary | ICD-10-CM | POA: Diagnosis not present

## 2019-04-27 DIAGNOSIS — G253 Myoclonus: Secondary | ICD-10-CM | POA: Clinically undetermined

## 2019-04-27 DIAGNOSIS — R404 Transient alteration of awareness: Secondary | ICD-10-CM | POA: Diagnosis not present

## 2019-04-27 DIAGNOSIS — E669 Obesity, unspecified: Secondary | ICD-10-CM | POA: Diagnosis present

## 2019-04-27 DIAGNOSIS — Z8719 Personal history of other diseases of the digestive system: Secondary | ICD-10-CM

## 2019-04-27 DIAGNOSIS — U071 COVID-19: Principal | ICD-10-CM

## 2019-04-27 DIAGNOSIS — R0689 Other abnormalities of breathing: Secondary | ICD-10-CM | POA: Diagnosis not present

## 2019-04-27 DIAGNOSIS — R570 Cardiogenic shock: Secondary | ICD-10-CM | POA: Diagnosis present

## 2019-04-27 DIAGNOSIS — Z811 Family history of alcohol abuse and dependence: Secondary | ICD-10-CM

## 2019-04-27 DIAGNOSIS — M6281 Muscle weakness (generalized): Secondary | ICD-10-CM | POA: Diagnosis not present

## 2019-04-27 DIAGNOSIS — R001 Bradycardia, unspecified: Secondary | ICD-10-CM | POA: Diagnosis present

## 2019-04-27 DIAGNOSIS — J1289 Other viral pneumonia: Secondary | ICD-10-CM | POA: Diagnosis present

## 2019-04-27 DIAGNOSIS — I319 Disease of pericardium, unspecified: Secondary | ICD-10-CM | POA: Diagnosis not present

## 2019-04-27 DIAGNOSIS — E785 Hyperlipidemia, unspecified: Secondary | ICD-10-CM | POA: Diagnosis present

## 2019-04-27 DIAGNOSIS — E1159 Type 2 diabetes mellitus with other circulatory complications: Secondary | ICD-10-CM | POA: Diagnosis not present

## 2019-04-27 DIAGNOSIS — E8770 Fluid overload, unspecified: Secondary | ICD-10-CM | POA: Diagnosis not present

## 2019-04-27 DIAGNOSIS — R159 Full incontinence of feces: Secondary | ICD-10-CM | POA: Diagnosis present

## 2019-04-27 DIAGNOSIS — E1140 Type 2 diabetes mellitus with diabetic neuropathy, unspecified: Secondary | ICD-10-CM | POA: Diagnosis present

## 2019-04-27 DIAGNOSIS — L89150 Pressure ulcer of sacral region, unstageable: Secondary | ICD-10-CM | POA: Diagnosis not present

## 2019-04-27 DIAGNOSIS — G40909 Epilepsy, unspecified, not intractable, without status epilepticus: Secondary | ICD-10-CM | POA: Diagnosis present

## 2019-04-27 DIAGNOSIS — I152 Hypertension secondary to endocrine disorders: Secondary | ICD-10-CM | POA: Diagnosis present

## 2019-04-27 DIAGNOSIS — R403 Persistent vegetative state: Secondary | ICD-10-CM | POA: Diagnosis not present

## 2019-04-27 DIAGNOSIS — I2510 Atherosclerotic heart disease of native coronary artery without angina pectoris: Secondary | ICD-10-CM | POA: Diagnosis present

## 2019-04-27 DIAGNOSIS — E118 Type 2 diabetes mellitus with unspecified complications: Secondary | ICD-10-CM

## 2019-04-27 DIAGNOSIS — D62 Acute posthemorrhagic anemia: Secondary | ICD-10-CM | POA: Diagnosis not present

## 2019-04-27 DIAGNOSIS — G9340 Encephalopathy, unspecified: Secondary | ICD-10-CM | POA: Diagnosis not present

## 2019-04-27 DIAGNOSIS — Z8673 Personal history of transient ischemic attack (TIA), and cerebral infarction without residual deficits: Secondary | ICD-10-CM

## 2019-04-27 DIAGNOSIS — Z4682 Encounter for fitting and adjustment of non-vascular catheter: Secondary | ICD-10-CM | POA: Diagnosis not present

## 2019-04-27 DIAGNOSIS — Z6838 Body mass index (BMI) 38.0-38.9, adult: Secondary | ICD-10-CM

## 2019-04-27 DIAGNOSIS — R69 Illness, unspecified: Secondary | ICD-10-CM | POA: Diagnosis not present

## 2019-04-27 DIAGNOSIS — R52 Pain, unspecified: Secondary | ICD-10-CM | POA: Diagnosis present

## 2019-04-27 DIAGNOSIS — I252 Old myocardial infarction: Secondary | ICD-10-CM

## 2019-04-27 DIAGNOSIS — N2581 Secondary hyperparathyroidism of renal origin: Secondary | ICD-10-CM | POA: Diagnosis present

## 2019-04-27 DIAGNOSIS — M109 Gout, unspecified: Secondary | ICD-10-CM | POA: Diagnosis not present

## 2019-04-27 DIAGNOSIS — R531 Weakness: Secondary | ICD-10-CM | POA: Diagnosis not present

## 2019-04-27 DIAGNOSIS — D649 Anemia, unspecified: Secondary | ICD-10-CM | POA: Diagnosis present

## 2019-04-27 DIAGNOSIS — I272 Pulmonary hypertension, unspecified: Secondary | ICD-10-CM | POA: Diagnosis not present

## 2019-04-27 DIAGNOSIS — E1122 Type 2 diabetes mellitus with diabetic chronic kidney disease: Secondary | ICD-10-CM | POA: Diagnosis present

## 2019-04-27 DIAGNOSIS — J9601 Acute respiratory failure with hypoxia: Secondary | ICD-10-CM | POA: Diagnosis present

## 2019-04-27 DIAGNOSIS — I469 Cardiac arrest, cause unspecified: Secondary | ICD-10-CM

## 2019-04-27 DIAGNOSIS — R918 Other nonspecific abnormal finding of lung field: Secondary | ICD-10-CM | POA: Diagnosis not present

## 2019-04-27 DIAGNOSIS — J156 Pneumonia due to other aerobic Gram-negative bacteria: Secondary | ICD-10-CM | POA: Diagnosis present

## 2019-04-27 DIAGNOSIS — R509 Fever, unspecified: Secondary | ICD-10-CM | POA: Diagnosis not present

## 2019-04-27 DIAGNOSIS — E440 Moderate protein-calorie malnutrition: Secondary | ICD-10-CM | POA: Diagnosis not present

## 2019-04-27 DIAGNOSIS — R5381 Other malaise: Secondary | ICD-10-CM | POA: Diagnosis not present

## 2019-04-27 DIAGNOSIS — E1129 Type 2 diabetes mellitus with other diabetic kidney complication: Secondary | ICD-10-CM | POA: Diagnosis present

## 2019-04-27 DIAGNOSIS — Z8249 Family history of ischemic heart disease and other diseases of the circulatory system: Secondary | ICD-10-CM

## 2019-04-27 DIAGNOSIS — I132 Hypertensive heart and chronic kidney disease with heart failure and with stage 5 chronic kidney disease, or end stage renal disease: Secondary | ICD-10-CM | POA: Diagnosis present

## 2019-04-27 DIAGNOSIS — J811 Chronic pulmonary edema: Secondary | ICD-10-CM | POA: Diagnosis present

## 2019-04-27 DIAGNOSIS — E46 Unspecified protein-calorie malnutrition: Secondary | ICD-10-CM | POA: Diagnosis present

## 2019-04-27 DIAGNOSIS — Z79899 Other long term (current) drug therapy: Secondary | ICD-10-CM

## 2019-04-27 DIAGNOSIS — G4733 Obstructive sleep apnea (adult) (pediatric): Secondary | ICD-10-CM | POA: Diagnosis present

## 2019-04-27 DIAGNOSIS — G931 Anoxic brain damage, not elsewhere classified: Secondary | ICD-10-CM

## 2019-04-27 DIAGNOSIS — Z8616 Personal history of COVID-19: Secondary | ICD-10-CM

## 2019-04-27 DIAGNOSIS — Z992 Dependence on renal dialysis: Secondary | ICD-10-CM

## 2019-04-27 DIAGNOSIS — R609 Edema, unspecified: Secondary | ICD-10-CM | POA: Diagnosis not present

## 2019-04-27 DIAGNOSIS — S0990XA Unspecified injury of head, initial encounter: Secondary | ICD-10-CM | POA: Diagnosis not present

## 2019-04-27 DIAGNOSIS — Z8674 Personal history of sudden cardiac arrest: Secondary | ICD-10-CM

## 2019-04-27 DIAGNOSIS — D631 Anemia in chronic kidney disease: Secondary | ICD-10-CM | POA: Diagnosis present

## 2019-04-27 DIAGNOSIS — S06899A Other specified intracranial injury with loss of consciousness of unspecified duration, initial encounter: Secondary | ICD-10-CM | POA: Diagnosis present

## 2019-04-27 DIAGNOSIS — I10 Essential (primary) hypertension: Secondary | ICD-10-CM

## 2019-04-27 DIAGNOSIS — R4020 Unspecified coma: Secondary | ICD-10-CM | POA: Diagnosis present

## 2019-04-27 DIAGNOSIS — Z9911 Dependence on respirator [ventilator] status: Secondary | ICD-10-CM

## 2019-04-27 DIAGNOSIS — R0902 Hypoxemia: Secondary | ICD-10-CM | POA: Diagnosis not present

## 2019-04-27 DIAGNOSIS — I120 Hypertensive chronic kidney disease with stage 5 chronic kidney disease or end stage renal disease: Secondary | ICD-10-CM | POA: Diagnosis not present

## 2019-04-27 DIAGNOSIS — Z7984 Long term (current) use of oral hypoglycemic drugs: Secondary | ICD-10-CM

## 2019-04-27 DIAGNOSIS — R569 Unspecified convulsions: Secondary | ICD-10-CM | POA: Diagnosis not present

## 2019-04-27 DIAGNOSIS — I5043 Acute on chronic combined systolic (congestive) and diastolic (congestive) heart failure: Secondary | ICD-10-CM | POA: Diagnosis not present

## 2019-04-27 DIAGNOSIS — Z87891 Personal history of nicotine dependence: Secondary | ICD-10-CM

## 2019-04-27 DIAGNOSIS — L89154 Pressure ulcer of sacral region, stage 4: Secondary | ICD-10-CM | POA: Diagnosis present

## 2019-04-27 DIAGNOSIS — Z8709 Personal history of other diseases of the respiratory system: Secondary | ICD-10-CM

## 2019-04-27 DIAGNOSIS — R0602 Shortness of breath: Secondary | ICD-10-CM | POA: Diagnosis not present

## 2019-04-27 DIAGNOSIS — I161 Hypertensive emergency: Secondary | ICD-10-CM | POA: Diagnosis not present

## 2019-04-27 DIAGNOSIS — G9341 Metabolic encephalopathy: Secondary | ICD-10-CM | POA: Diagnosis present

## 2019-04-27 DIAGNOSIS — L89309 Pressure ulcer of unspecified buttock, unspecified stage: Secondary | ICD-10-CM | POA: Diagnosis not present

## 2019-04-27 DIAGNOSIS — L8990 Pressure ulcer of unspecified site, unspecified stage: Secondary | ICD-10-CM | POA: Insufficient documentation

## 2019-04-27 DIAGNOSIS — R41841 Cognitive communication deficit: Secondary | ICD-10-CM | POA: Diagnosis not present

## 2019-04-27 DIAGNOSIS — Z833 Family history of diabetes mellitus: Secondary | ICD-10-CM

## 2019-04-27 DIAGNOSIS — W19XXXS Unspecified fall, sequela: Secondary | ICD-10-CM | POA: Diagnosis not present

## 2019-04-27 DIAGNOSIS — K5900 Constipation, unspecified: Secondary | ICD-10-CM | POA: Diagnosis present

## 2019-04-27 DIAGNOSIS — Z4659 Encounter for fitting and adjustment of other gastrointestinal appliance and device: Secondary | ICD-10-CM

## 2019-04-27 DIAGNOSIS — N186 End stage renal disease: Secondary | ICD-10-CM

## 2019-04-27 DIAGNOSIS — Z978 Presence of other specified devices: Secondary | ICD-10-CM

## 2019-04-27 DIAGNOSIS — I5032 Chronic diastolic (congestive) heart failure: Secondary | ICD-10-CM | POA: Diagnosis not present

## 2019-04-27 DIAGNOSIS — Z7982 Long term (current) use of aspirin: Secondary | ICD-10-CM

## 2019-04-27 DIAGNOSIS — L89153 Pressure ulcer of sacral region, stage 3: Secondary | ICD-10-CM | POA: Diagnosis not present

## 2019-04-27 DIAGNOSIS — J810 Acute pulmonary edema: Secondary | ICD-10-CM | POA: Diagnosis not present

## 2019-04-27 DIAGNOSIS — E875 Hyperkalemia: Secondary | ICD-10-CM

## 2019-04-27 DIAGNOSIS — Z452 Encounter for adjustment and management of vascular access device: Secondary | ICD-10-CM | POA: Diagnosis not present

## 2019-04-27 DIAGNOSIS — R092 Respiratory arrest: Secondary | ICD-10-CM

## 2019-04-27 DIAGNOSIS — J9600 Acute respiratory failure, unspecified whether with hypoxia or hypercapnia: Secondary | ICD-10-CM

## 2019-04-27 DIAGNOSIS — D72829 Elevated white blood cell count, unspecified: Secondary | ICD-10-CM | POA: Diagnosis not present

## 2019-04-27 DIAGNOSIS — E119 Type 2 diabetes mellitus without complications: Secondary | ICD-10-CM | POA: Diagnosis not present

## 2019-04-27 DIAGNOSIS — L89102 Pressure ulcer of unspecified part of back, stage 2: Secondary | ICD-10-CM | POA: Diagnosis present

## 2019-04-27 DIAGNOSIS — J80 Acute respiratory distress syndrome: Secondary | ICD-10-CM | POA: Diagnosis not present

## 2019-04-27 DIAGNOSIS — L98429 Non-pressure chronic ulcer of back with unspecified severity: Secondary | ICD-10-CM | POA: Diagnosis not present

## 2019-04-27 DIAGNOSIS — Z8619 Personal history of other infectious and parasitic diseases: Secondary | ICD-10-CM

## 2019-04-27 DIAGNOSIS — R000 Tachycardia, unspecified: Secondary | ICD-10-CM | POA: Diagnosis not present

## 2019-04-27 DIAGNOSIS — I429 Cardiomyopathy, unspecified: Secondary | ICD-10-CM | POA: Diagnosis present

## 2019-04-27 DIAGNOSIS — Z9289 Personal history of other medical treatment: Secondary | ICD-10-CM

## 2019-04-27 DIAGNOSIS — Z7401 Bed confinement status: Secondary | ICD-10-CM | POA: Diagnosis not present

## 2019-04-27 DIAGNOSIS — M2550 Pain in unspecified joint: Secondary | ICD-10-CM | POA: Diagnosis not present

## 2019-04-27 DIAGNOSIS — J9690 Respiratory failure, unspecified, unspecified whether with hypoxia or hypercapnia: Secondary | ICD-10-CM | POA: Diagnosis not present

## 2019-04-27 DIAGNOSIS — I959 Hypotension, unspecified: Secondary | ICD-10-CM | POA: Diagnosis not present

## 2019-04-27 HISTORY — DX: Respiratory arrest: R09.2

## 2019-04-27 HISTORY — DX: Personal history of other diseases of the respiratory system: Z87.09

## 2019-04-27 HISTORY — DX: Personal history of sudden cardiac arrest: Z86.74

## 2019-04-27 LAB — LACTIC ACID, PLASMA
Lactic Acid, Venous: 8.7 mmol/L (ref 0.5–1.9)
Lactic Acid, Venous: 1.7 mmol/L (ref 0.5–1.9)
Lactic Acid, Venous: 2.3 mmol/L (ref 0.5–1.9)

## 2019-04-27 LAB — COMPREHENSIVE METABOLIC PANEL
ALT: 120 U/L — ABNORMAL HIGH (ref 0–44)
AST: 59 U/L — ABNORMAL HIGH (ref 15–41)
Albumin: 3.6 g/dL (ref 3.5–5.0)
Alkaline Phosphatase: 74 U/L (ref 38–126)
Anion gap: 20 — ABNORMAL HIGH (ref 5–15)
BUN: 94 mg/dL — ABNORMAL HIGH (ref 6–20)
CO2: 22 mmol/L (ref 22–32)
Calcium: 8.3 mg/dL — ABNORMAL LOW (ref 8.9–10.3)
Chloride: 96 mmol/L — ABNORMAL LOW (ref 98–111)
Creatinine, Ser: 14.7 mg/dL — ABNORMAL HIGH (ref 0.61–1.24)
GFR calc Af Amer: 4 mL/min — ABNORMAL LOW (ref >60)
GFR calc non Af Amer: 3 mL/min — ABNORMAL LOW (ref >60)
Glucose, Bld: 237 mg/dL — ABNORMAL HIGH (ref 70–99)
Potassium: 5.6 mmol/L — ABNORMAL HIGH (ref 3.5–5.1)
Sodium: 138 mmol/L (ref 135–145)
Total Bilirubin: 1.1 mg/dL (ref 0.3–1.2)
Total Protein: 7.1 g/dL (ref 6.5–8.1)

## 2019-04-27 LAB — CBC WITH DIFFERENTIAL/PLATELET
Abs Immature Granulocytes: 0.57 10*3/uL — ABNORMAL HIGH (ref 0.00–0.07)
Basophils Absolute: 0 10*3/uL (ref 0.0–0.1)
Basophils Relative: 0 %
Eosinophils Absolute: 0.1 10*3/uL (ref 0.0–0.5)
Eosinophils Relative: 1 %
HCT: 31.8 % — ABNORMAL LOW (ref 39.0–52.0)
Hemoglobin: 9.9 g/dL — ABNORMAL LOW (ref 13.0–17.0)
Immature Granulocytes: 4 %
Lymphocytes Relative: 24 %
Lymphs Abs: 3.7 10*3/uL (ref 0.7–4.0)
MCH: 27.5 pg (ref 26.0–34.0)
MCHC: 31.1 g/dL (ref 30.0–36.0)
MCV: 88.3 fL (ref 80.0–100.0)
Monocytes Absolute: 1.1 10*3/uL — ABNORMAL HIGH (ref 0.1–1.0)
Monocytes Relative: 7 %
Neutro Abs: 10.3 10*3/uL — ABNORMAL HIGH (ref 1.7–7.7)
Neutrophils Relative %: 64 %
Platelets: 266 10*3/uL (ref 150–400)
RBC: 3.6 MIL/uL — ABNORMAL LOW (ref 4.22–5.81)
RDW: 16.8 % — ABNORMAL HIGH (ref 11.5–15.5)
WBC: 15.8 10*3/uL — ABNORMAL HIGH (ref 4.0–10.5)
nRBC: 1 % — ABNORMAL HIGH (ref 0.0–0.2)

## 2019-04-27 LAB — CBC
HCT: 28.9 % — ABNORMAL LOW (ref 39.0–52.0)
Hemoglobin: 9.7 g/dL — ABNORMAL LOW (ref 13.0–17.0)
MCH: 28.1 pg (ref 26.0–34.0)
MCHC: 33.6 g/dL (ref 30.0–36.0)
MCV: 83.8 fL (ref 80.0–100.0)
Platelets: 219 10*3/uL (ref 150–400)
RBC: 3.45 MIL/uL — ABNORMAL LOW (ref 4.22–5.81)
RDW: 16.8 % — ABNORMAL HIGH (ref 11.5–15.5)
WBC: 15.9 10*3/uL — ABNORMAL HIGH (ref 4.0–10.5)
nRBC: 0.3 % — ABNORMAL HIGH (ref 0.0–0.2)

## 2019-04-27 LAB — POCT I-STAT 7, (LYTES, BLD GAS, ICA,H+H)
Acid-Base Excess: 3 mmol/L — ABNORMAL HIGH (ref 0.0–2.0)
Bicarbonate: 30.5 mmol/L — ABNORMAL HIGH (ref 20.0–28.0)
Calcium, Ion: 1.02 mmol/L — ABNORMAL LOW (ref 1.15–1.40)
HCT: 30 % — ABNORMAL LOW (ref 39.0–52.0)
Hemoglobin: 10.2 g/dL — ABNORMAL LOW (ref 13.0–17.0)
O2 Saturation: 99 %
Potassium: 6.1 mmol/L — ABNORMAL HIGH (ref 3.5–5.1)
Sodium: 135 mmol/L (ref 135–145)
TCO2: 32 mmol/L (ref 22–32)
pCO2 arterial: 59.1 mmHg — ABNORMAL HIGH (ref 32.0–48.0)
pH, Arterial: 7.321 — ABNORMAL LOW (ref 7.350–7.450)
pO2, Arterial: 183 mmHg — ABNORMAL HIGH (ref 83.0–108.0)

## 2019-04-27 LAB — I-STAT CHEM 8, ED
BUN: 101 mg/dL — ABNORMAL HIGH (ref 6–20)
Calcium, Ion: 1.03 mmol/L — ABNORMAL LOW (ref 1.15–1.40)
Chloride: 100 mmol/L (ref 98–111)
Creatinine, Ser: 13.6 mg/dL — ABNORMAL HIGH (ref 0.61–1.24)
Glucose, Bld: 238 mg/dL — ABNORMAL HIGH (ref 70–99)
HCT: 33 % — ABNORMAL LOW (ref 39.0–52.0)
Hemoglobin: 11.2 g/dL — ABNORMAL LOW (ref 13.0–17.0)
Potassium: 5.5 mmol/L — ABNORMAL HIGH (ref 3.5–5.1)
Sodium: 136 mmol/L (ref 135–145)
TCO2: 24 mmol/L (ref 22–32)

## 2019-04-27 LAB — MAGNESIUM
Magnesium: 2.8 mg/dL — ABNORMAL HIGH (ref 1.7–2.4)
Magnesium: 2.5 mg/dL — ABNORMAL HIGH (ref 1.7–2.4)

## 2019-04-27 LAB — TROPONIN I (HIGH SENSITIVITY)
Troponin I (High Sensitivity): 98 ng/L — ABNORMAL HIGH (ref <18)
Troponin I (High Sensitivity): 143 ng/L (ref <18)
Troponin I (High Sensitivity): 229 ng/L (ref <18)

## 2019-04-27 LAB — GLUCOSE, CAPILLARY
Glucose-Capillary: 109 mg/dL — ABNORMAL HIGH (ref 70–99)
Glucose-Capillary: 85 mg/dL (ref 70–99)
Glucose-Capillary: 86 mg/dL (ref 70–99)

## 2019-04-27 LAB — CBG MONITORING, ED: Glucose-Capillary: 170 mg/dL — ABNORMAL HIGH (ref 70–99)

## 2019-04-27 LAB — ECHOCARDIOGRAM COMPLETE
Height: 70 in
Weight: 4056.46 oz

## 2019-04-27 LAB — HEPARIN LEVEL (UNFRACTIONATED): Heparin Unfractionated: 0.54 IU/mL (ref 0.30–0.70)

## 2019-04-27 LAB — PHOSPHORUS: Phosphorus: 7.9 mg/dL — ABNORMAL HIGH (ref 2.5–4.6)

## 2019-04-27 LAB — PROTIME-INR
INR: 1.1 (ref 0.8–1.2)
Prothrombin Time: 13.9 seconds (ref 11.4–15.2)

## 2019-04-27 LAB — PROCALCITONIN: Procalcitonin: 1.3 ng/mL

## 2019-04-27 LAB — BRAIN NATRIURETIC PEPTIDE: B Natriuretic Peptide: 1048.8 pg/mL — ABNORMAL HIGH (ref 0.0–100.0)

## 2019-04-27 LAB — AMMONIA: Ammonia: 50 umol/L — ABNORMAL HIGH (ref 9–35)

## 2019-04-27 LAB — APTT: aPTT: 155 seconds — ABNORMAL HIGH (ref 24–36)

## 2019-04-27 MED ORDER — PROPOFOL 1000 MG/100ML IV EMUL
5.0000 ug/kg/min | INTRAVENOUS | Status: DC
  Administered 2019-04-27: 20 ug/kg/min via INTRAVENOUS
  Administered 2019-04-27: 15 ug/kg/min via INTRAVENOUS
  Administered 2019-04-27: 14:00:00 20 ug/kg/min via INTRAVENOUS
  Administered 2019-04-28: 10 ug/kg/min via INTRAVENOUS
  Filled 2019-04-27 (×2): qty 100

## 2019-04-27 MED ORDER — MIDAZOLAM HCL 2 MG/2ML IJ SOLN
2.0000 mg | INTRAMUSCULAR | Status: DC | PRN
  Administered 2019-04-29: 2 mg via INTRAVENOUS
  Filled 2019-04-27 (×2): qty 2

## 2019-04-27 MED ORDER — PANTOPRAZOLE SODIUM 40 MG PO PACK
40.0000 mg | PACK | Freq: Every day | ORAL | Status: DC
  Administered 2019-04-28 – 2019-04-29 (×2): 40 mg
  Filled 2019-04-27 (×3): qty 20

## 2019-04-27 MED ORDER — HYDRALAZINE HCL 20 MG/ML IJ SOLN
10.0000 mg | INTRAMUSCULAR | Status: DC | PRN
  Administered 2019-05-01 – 2019-05-05 (×2): 10 mg via INTRAVENOUS
  Administered 2019-05-06: 20 mg via INTRAVENOUS
  Administered 2019-05-07 – 2019-05-09 (×5): 10 mg via INTRAVENOUS
  Filled 2019-04-27 (×13): qty 1

## 2019-04-27 MED ORDER — CHLORHEXIDINE GLUCONATE CLOTH 2 % EX PADS
6.0000 | MEDICATED_PAD | Freq: Every day | CUTANEOUS | Status: DC
  Administered 2019-04-28 – 2019-04-29 (×2): 6 via TOPICAL

## 2019-04-27 MED ORDER — CALCIUM GLUCONATE-NACL 1-0.675 GM/50ML-% IV SOLN
1.0000 g | Freq: Once | INTRAVENOUS | Status: AC
  Administered 2019-04-27: 1000 mg via INTRAVENOUS

## 2019-04-27 MED ORDER — INSULIN ASPART 100 UNIT/ML ~~LOC~~ SOLN
2.0000 [IU] | SUBCUTANEOUS | Status: DC
  Administered 2019-04-27: 4 [IU] via SUBCUTANEOUS
  Administered 2019-04-28 (×3): 2 [IU] via SUBCUTANEOUS
  Administered 2019-04-29 (×2): 4 [IU] via SUBCUTANEOUS
  Administered 2019-04-29 – 2019-04-30 (×5): 2 [IU] via SUBCUTANEOUS
  Administered 2019-04-30: 4 [IU] via SUBCUTANEOUS
  Administered 2019-04-30: 2 [IU] via SUBCUTANEOUS
  Administered 2019-04-30 – 2019-05-01 (×4): 4 [IU] via SUBCUTANEOUS
  Administered 2019-05-01 (×2): 2 [IU] via SUBCUTANEOUS
  Administered 2019-05-01: 4 [IU] via SUBCUTANEOUS
  Administered 2019-05-02: 2 [IU] via SUBCUTANEOUS
  Administered 2019-05-02: 4 [IU] via SUBCUTANEOUS
  Administered 2019-05-02 (×2): 2 [IU] via SUBCUTANEOUS
  Administered 2019-05-03 – 2019-05-04 (×2): 4 [IU] via SUBCUTANEOUS
  Administered 2019-05-04 (×4): 2 [IU] via SUBCUTANEOUS
  Administered 2019-05-05 (×2): 4 [IU] via SUBCUTANEOUS
  Administered 2019-05-05 (×2): 2 [IU] via SUBCUTANEOUS
  Administered 2019-05-05: 4 [IU] via SUBCUTANEOUS
  Administered 2019-05-06 – 2019-05-07 (×3): 2 [IU] via SUBCUTANEOUS
  Administered 2019-05-07: 6 [IU] via SUBCUTANEOUS
  Administered 2019-05-07: 2 [IU] via SUBCUTANEOUS
  Administered 2019-05-08: 4 [IU] via SUBCUTANEOUS
  Administered 2019-05-08 (×2): 6 [IU] via SUBCUTANEOUS
  Administered 2019-05-08 – 2019-05-09 (×6): 4 [IU] via SUBCUTANEOUS
  Administered 2019-05-09: 6 [IU] via SUBCUTANEOUS
  Administered 2019-05-09: 4 [IU] via SUBCUTANEOUS
  Administered 2019-05-10 (×4): 2 [IU] via SUBCUTANEOUS
  Administered 2019-05-10: 6 [IU] via SUBCUTANEOUS
  Administered 2019-05-10: 2 [IU] via SUBCUTANEOUS
  Administered 2019-05-10: 6 [IU] via SUBCUTANEOUS
  Administered 2019-05-11: 2 [IU] via SUBCUTANEOUS
  Administered 2019-05-11 (×2): 4 [IU] via SUBCUTANEOUS
  Administered 2019-05-12 (×3): 2 [IU] via SUBCUTANEOUS
  Administered 2019-05-12: 4 [IU] via SUBCUTANEOUS
  Administered 2019-05-12: 2 [IU] via SUBCUTANEOUS

## 2019-04-27 MED ORDER — PROPOFOL 1000 MG/100ML IV EMUL
INTRAVENOUS | Status: AC
  Administered 2019-04-27: 20 ug/kg/min via INTRAVENOUS
  Filled 2019-04-27: qty 100

## 2019-04-27 MED ORDER — PROPOFOL 1000 MG/100ML IV EMUL
INTRAVENOUS | Status: AC
  Filled 2019-04-27: qty 100

## 2019-04-27 MED ORDER — FENTANYL CITRATE (PF) 100 MCG/2ML IJ SOLN
50.0000 ug | Freq: Once | INTRAMUSCULAR | Status: AC
  Administered 2019-04-27: 50 ug via INTRAVENOUS

## 2019-04-27 MED ORDER — ALBUTEROL SULFATE (2.5 MG/3ML) 0.083% IN NEBU: 2.5000 mg | INHALATION_SOLUTION | Freq: Four times a day (QID) | RESPIRATORY_TRACT | Status: DC | PRN

## 2019-04-27 MED ORDER — HEPARIN BOLUS VIA INFUSION
6500.0000 [IU] | Freq: Once | INTRAVENOUS | Status: AC
  Administered 2019-04-27: 6500 [IU] via INTRAVENOUS
  Filled 2019-04-27: qty 6500

## 2019-04-27 MED ORDER — ETOMIDATE 2 MG/ML IV SOLN
20.0000 mg | Freq: Once | INTRAVENOUS | Status: AC
  Administered 2019-04-27: 20 mg via INTRAVENOUS

## 2019-04-27 MED ORDER — FENTANYL 2500MCG IN NS 250ML (10MCG/ML) PREMIX INFUSION
50.0000 ug/h | INTRAVENOUS | Status: DC
  Administered 2019-04-27: 50 ug/h via INTRAVENOUS
  Administered 2019-04-28: 100 ug/h via INTRAVENOUS
  Administered 2019-04-29: 200 ug/h via INTRAVENOUS
  Administered 2019-05-01: 04:00:00 100 ug/h via INTRAVENOUS
  Filled 2019-04-27 (×5): qty 250

## 2019-04-27 MED ORDER — HEPARIN (PORCINE) 25000 UT/250ML-% IV SOLN
1600.0000 [IU]/h | INTRAVENOUS | Status: DC
  Administered 2019-04-27 – 2019-04-28 (×2): 1700 [IU]/h via INTRAVENOUS
  Administered 2019-04-28: 1650 [IU]/h via INTRAVENOUS
  Administered 2019-04-29 – 2019-04-30 (×3): 1600 [IU]/h via INTRAVENOUS
  Filled 2019-04-27 (×5): qty 250

## 2019-04-27 MED ORDER — FAMOTIDINE 40 MG/5ML PO SUSR
20.0000 mg | Freq: Every day | ORAL | Status: DC
  Administered 2019-04-28 – 2019-04-29 (×2): 20 mg via ORAL
  Filled 2019-04-27 (×2): qty 2.5

## 2019-04-27 MED ORDER — SODIUM BICARBONATE 8.4 % IV SOLN
50.0000 meq | Freq: Once | INTRAVENOUS | Status: AC
  Administered 2019-04-27: 50 meq via INTRAVENOUS

## 2019-04-27 MED ORDER — FENTANYL BOLUS VIA INFUSION
50.0000 ug | INTRAVENOUS | Status: DC | PRN
  Administered 2019-04-30: 50 ug via INTRAVENOUS
  Filled 2019-04-27: qty 50

## 2019-04-27 MED ORDER — MIDAZOLAM HCL 2 MG/2ML IJ SOLN
2.0000 mg | INTRAMUSCULAR | Status: DC | PRN
  Administered 2019-04-29 – 2019-04-30 (×2): 2 mg via INTRAVENOUS
  Administered 2019-05-01: 4 mg via INTRAVENOUS
  Administered 2019-05-01 (×2): 2 mg via INTRAVENOUS
  Filled 2019-04-27 (×6): qty 2

## 2019-04-27 MED ORDER — ROCURONIUM BROMIDE 50 MG/5ML IV SOLN
100.0000 mg | Freq: Once | INTRAVENOUS | Status: AC
  Administered 2019-04-27: 100 mg via INTRAVENOUS

## 2019-04-27 MED ORDER — ACETAMINOPHEN 325 MG PO TABS
650.0000 mg | ORAL_TABLET | ORAL | Status: DC | PRN
  Administered 2019-05-01 – 2019-05-21 (×6): 650 mg via ORAL
  Filled 2019-04-27 (×7): qty 2

## 2019-04-27 NOTE — ED Notes (Signed)
King airway placed prior to arrival by EMS. EMS reported the dialysis center BP 155/96 HR 70. Did not have dialysis today.

## 2019-04-27 NOTE — ED Notes (Signed)
Call radiology for STAT chest xray

## 2019-04-27 NOTE — ED Notes (Signed)
Notified the [facility] patient needs a COVID test per Critical Care.

## 2019-04-27 NOTE — Consult Note (Signed)
Renal Service Consult Note [facility]  Jazmin Vensel 04/27/2019 [name] Requesting Physician:  Dr Loanne Drilling  Reason for Consult:  ESRD pt w/ resp failure and card arrest HPI: The patient is a 53 y.o. year-old w/ hx of ESRD, OSA, HTN, HL, DM2, CAD who was recently admitted 10/27 - 04/20/2019 for COVID resp infection, hypoxemia, ESRD, DM and pHTN.  Completed course of abx, antivirals and steroids and dc'd w/o supplemental O2.  Pt has been getting [facility] covid [facility]. Arrived to HD this am in distress. During EMS transport arrested w/ PEA and was resuscitated after about 13 min.  Is in ED now on vent. BP's are high , CCM is admitting. Asked to see for esrd.   No hx from pt.    Past Medical History  Past Medical History:  Diagnosis Date  . CARDIAC ARREST 11/01/2009   Qualifier: Diagnosis of  By: Selena Batten CMA, [place]    . Colon polyps 01/02/2016   Colonoscopy July 2017 - One 3 mm polyp in the transverse colon, removed with a cold biopsy forceps. Resected and retrieved. - One 3 mm polyp in the rectum, removed with a cold biopsy forceps. Resected and retrieved. - Diverticulosis in the entire examined colon. - Non-bleeding internal hemorrhoids. - The examination was otherwise normal. - Significant looping which prolonged cecal    . Coronary artery disease 08/15/2009   with AMI  . Dialysis patient [facility]) 09-27-2014   mon, wed, and fri  . Diverticulosis of colon without hemorrhage 01/02/2016   Colonoscopy July 2017 - One 3 mm polyp in the transverse colon, removed with a cold biopsy forceps. Resected and retrieved. - One 3 mm polyp in the rectum, removed with a cold biopsy forceps. Resected and retrieved. - Diverticulosis in the entire examined colon. - Non-bleeding internal hemorrhoids. - The examination was otherwise normal. - Significant looping which prolonged cecal    . DM (diabetes mellitus), type 2 with renal complications ([place]) 09/12/9240  .  Essential hypertension 11/01/2009   Qualifier: Diagnosis of  By: Selena Batten CMA, Jewel    . Hyperlipidemia   . Hypertension   . Mitral valve regurgitation 09/19/2015   Echo 09/2015   . OSA treated with BiPAP 06/17/2010  . Sleep apnea    cpap nightly   Past Surgical History  Past Surgical History:  Procedure Laterality Date  . AV FISTULA PLACEMENT Left 09/27/2014  . INSERTION OF ARTERIOVENOUS (AV) ARTEGRAFT ARM Left 04/02/2018   Procedure: INSERTION OF ARTERIOVENOUS (AV) ARTEGRAFT ARM LEFT UPPER ARM;  Surgeon: Waynetta Sandy, MD;  Location: [place];  Service: Vascular;  Laterality: Left;  . INSERTION OF DIALYSIS CATHETER N/A 04/02/2018   Procedure: INSERTION OF DIALYSIS CATHETER, right internal jugular;  Surgeon: Waynetta Sandy, MD;  Location: [place];  Service: Vascular;  Laterality: N/A;  . NECK SURGERY     C6 & C7 replaced 30 yrs ago per pt  . REVISON OF ARTERIOVENOUS FISTULA Left 04/02/2018   Procedure: REVISION PLICATION OF ARTERIOVENOUS FISTULA ARM;  Surgeon: Waynetta Sandy, MD;  Location: [place];  Service: Vascular;  Laterality: Left;  . WISDOM TOOTH EXTRACTION     Family History  Family History  Problem Relation Age of Onset  . Hypertension Mother   . Heart disease Mother 81       cause of death  . Hypertension Father   . Aneurysm Father 33       brain aneurysm  . Heart  disease Father 64       cause of death  . Hypertension Brother   . Alcohol abuse Brother   . Diabetes Brother   . Pancreatitis Brother   . Hypertension Brother   . Colon cancer Neg Hx    Social History  reports that he quit smoking about 7 years ago. His smoking use included cigars. He has a 10.00 pack-year smoking history. He has never used smokeless tobacco. He reports current alcohol use of about 1.0 standard drinks of alcohol per week. He reports that he does not use drugs. Allergies No Known Allergies Home medications Prior to Admission medications   Medication Sig Start Date  End Date Taking? Authorizing Provider  albuterol (VENTOLIN HFA) 108 (90 Base) MCG/ACT inhaler Inhale 1-2 puffs into the lungs every 6 (six) hours as needed for wheezing or shortness of breath. 04/20/19   Daisy Floro, DO  allopurinol (ZYLOPRIM) 100 MG tablet Take 100 mg by mouth 2 (two) times daily.  10/04/14   [provider]  amLODipine (NORVASC) 10 MG tablet Take 10 mg by mouth daily.    [provider]  aspirin 81 MG tablet Take 81 mg by mouth daily. Reported on 01/02/2016    [provider]  atorvastatin (LIPITOR) 80 MG tablet Take 1 tablet (80 mg total) by mouth daily. 04/21/19   Daisy Floro, DO  AURYXIA 1 GM 210 MG(Fe) tablet Take 420-840 mg by mouth See admin instructions. 840 mg with meals, and 420 mg with snacks 01/11/19   [provider]  carvedilol (COREG) 25 MG tablet Take 1 tablet (25 mg total) by mouth 2 (two) times daily with a meal. 08/31/15   [name], MD  cetirizine (ZYRTEC) 5 MG tablet Take 1 tablet (5 mg total) by mouth daily. Patient taking differently: Take 5 mg by mouth daily as needed for allergies.  10/21/18   [name], MD  colchicine 0.6 MG tablet Take 0.6 mg by mouth 2 (two) times daily as needed (gout).  12/11/10   [provider]  gabapentin (NEURONTIN) 300 MG capsule TAKE ONE CAPSULE BY MOUTH THREE TIMES DAILY Patient taking differently: Take 300 mg by mouth at bedtime.  09/30/16   [name], MD  glipiZIDE (GLUCOTROL) 5 MG tablet Take 5 mg by mouth daily. 10/04/14   [provider]  linagliptin (TRADJENTA) 5 MG TABS tablet Take 1 tablet (5 mg total) by mouth daily. 04/20/19   Daisy Floro, DO  minoxidil (LONITEN) 10 MG tablet Take 2 tablets (20 mg total) by mouth 2 (two) times daily. 09/11/17   Zenia Resides, MD  multivitamin (RENA-VIT) TABS tablet Take 1 tablet by mouth at bedtime.    [provider]  PRESCRIPTION MEDICATION Pt has CPAP machine    [provider]   torsemide (DEMADEX) 100 MG tablet Take 100 mg by mouth See admin instructions. Take on Tuesday, Thursday, Saturday, and Sunday     [provider]   Liver Function Tests Recent Labs  Lab 04/27/19 1300  AST 59*  ALT 120*  ALKPHOS 74  BILITOT 1.1  PROT 7.1  ALBUMIN 3.6   No results for input(s): LIPASE, AMYLASE in the last 168 hours. CBC Recent Labs  Lab 04/27/19 1300 04/27/19 1310  WBC 15.8*  --   NEUTROABS 10.3*  --   HGB 9.9* 11.2*  HCT 31.8* 33.0*  MCV 88.3  --   PLT 266  --    Basic Metabolic  Panel Recent Labs  Lab 04/27/19 1300 04/27/19 1310  NA 138 136  K 5.6* 5.5*  CL 96* 100  CO2 22  --   GLUCOSE 237* 238*  BUN 94* 101*  CREATININE 14.70* 13.60*  CALCIUM 8.3*  --   PHOS 7.9*  --    Iron/TIBC/Ferritin/ %Sat No results found for: IRON, TIBC, FERRITIN, IRONPCTSAT  Vitals:   04/27/19 1445 04/27/19 1450 04/27/19 1455 04/27/19 1500  BP: (!) 188/88   (!) 160/80  Pulse: 99 90 92 91  Resp: 20 (!) 24 (!) 24 (!) 22  Temp: (!) 95 F (35 C) (!) 95.1 F (35.1 C) (!) 95.1 F (35.1 C) (!) 95.2 F (35.1 C)  TempSrc:      SpO2: 94% 97% 99% 99%  Weight:      Height:        Exam  Patient not examined today directly given COVID-19 + status, utilizing exam of the primary team and observations of RN's.  Per CCM MD pt w/o peripheral edema, has frothy materials coming up in ETT however.       Home meds:  - amlodipine 10/ carvedilol 25 bid/ minoxidil 20 bid  - linagliptin 5/ glipizide 5   - gabapentin 300 hs  - aspirin 81/ atrovastatin 80  - allopurinol 100 bid  - auryxia / prn's/ vitamins/ supplements     Outpt HD: TTS     4.5h  F250  124.5kg  400/800  2/2.25  Hep 12,000  TDC  - coming off 5-6 kg over dry wt  - mircera 30 q4 last 10/13  - hect 4     Assessment/ Plan: 1. Resp failure/ diffuse pulm infiltrates - recent COVID pna dc'd 7d ago, however pt's high BP, frothy ETT fluids and hx of coming off HD 5-6 kg over this past wk, this could  be all vol overload. Have d/w CCM, will plan HD and see how he responds.   2. ESRD on HD TTS 3. HTN - BP's high in ED 4. SP PEA arrest - en route 5. DM2 - per primary 6. Recent COVID pna - dc'd on 11/3      Kelly Splinter  MD 04/27/2019, 3:49 PM

## 2019-04-27 NOTE — ED Notes (Addendum)
Warm blankets applied increased temperature in room.

## 2019-04-27 NOTE — ED Notes (Signed)
Critical care at bedside  

## 2019-04-27 NOTE — Consult Note (Signed)
NAME:  Troy Wells, MRN:  [id number], DOB:  18-Feb-1966, LOS: 0 ADMISSION DATE:  04/27/2019, CONSULTATION DATE:  04/27/19 REFERRING MD:  Larrie Kass, MD CHIEF COMPLAINT:  PEA arrest  Brief History   53 year old male with ESRD and recent COVID-19 pneumonia who presents after syncopal episode and found in PEA arrest.  History of present illness   Unable to provide history due to critical illness. History obtained by chart review.  53 year old male with ESRD on HD who presents after syncopal episode and found in PEA arrest. Patient presented to dialysis center and on arrival complained of shortness of breath. He was found pale, diaphoretic and hypoxemic to 71% and bradycardic to 40s. During transport, he went into PEA arrest and received CPR for 13 minutes with epi x1, calcium and 1/2 amp sodium bicarb. King airway was placed in the field. On arrival to the ED, kingairway was exchanged for ETT. PCCM consulted for admission.  Of note, patient had recent admission for COVID-19 pneumonia and treated with oxygen, steroids, remdsivir and decadron. Hospital admission significant for NSTEMI and reduced EF. He was scheduled to follow-up with Cardiology to consider outpatient nuclear test. On discharge, he was able to maintain saturations on room air.   Past Medical History  ESRD on HD Chronic anemia Chronic systolic heart failure CAD HTN OSA on CPAP DM2 HLD  Significant Hospital Events   11/10 Admitted  Consults:  PCCM  Procedures:  ETT 11/10>  Significant Diagnostic Tests:  CXR 11/10> Bilateral airspace disease suggestive of volume overload  Micro Data:  BCx 11/10>   Antimicrobials:  Vanc 11/10> Cefepime 11/10>  Interim history/subjective:  As above  Objective   Blood pressure (!) 157/84, pulse 100, temperature (!) 97 F (36.1 C), temperature source Temporal, resp. rate 20, height 5\' 10"  (1.778 m), weight 115 kg, SpO2 96 %.    Vent Mode: PCV FiO2 (%):  [100 %] 100 %  Set Rate:  [16 bmp-20 bmp] 20 bmp Vt Set:  [580 mL] 580 mL PEEP:  [5 cmH20] 5 cmH20 Plateau Pressure:  [28 cmH20-33 cmH20] 28 cmH20  No intake or output data in the 24 hours ending 04/27/19 1421 Filed Weights   04/27/19 1314  Weight: 115 kg   Physical Exam: General: Chronically ill-appearing, sedated, on mechanical ventilation HENT: [place], AT, ETT in place, pink frothy secretions Eyes: EOMI, no scleral icterus Respiratory: Vented breath sounds Cardiovascular: RRR, -M/R/G, no JVD GI: BS+, soft, nontender Extremities:-Edema,-tenderness Neuro: Sedated Skin: Intact, no rashes or bruising  Resolved Hospital Problem list     Assessment & Plan:   PEA arrest preceded by syncope Inciting event may have been hypoxemia related to volume overload however cannot rule out PE especially in setting of recent COVID-19 infection Plan: Telemetry Echocardiogram LE dopplers Heparin gtt per pharmacy protocol  Acute hypoxemic respiratory failure Plan: Full vent support. Changed to PRVC, increased RR 30 and increased PEEP 16 ARDSnet protocol Wean PEEP/FIO2 for goal SpO2 88-95%. Repeat ABG in 1 hour Plan for dialysis for volume removal VAP  RASS goal -2: Fentanyl gtt  Sepsis secondary to possible respiratory source Plan: Empiric antibiotics Trend LA Obtain trach aspirate Follow-up cultures  ESRD on HD Plan: Appreciate Nephrology input. Plan for HD for volume removal Trend BMP, Mg, Phos  Best practice:  Diet: NPO Pain/Anxiety/Delirium protocol (if indicated): Fentanyl gtt and PRN fentanyl and Versed VAP protocol (if indicated): Yes DVT prophylaxis: Heparin gtt GI prophylaxis: Pepcid Glucose control: SSI Mobility:  Code Status:  Full Family Communication: Will update family Disposition: Admit to ICU  Labs   CBC: Recent Labs  Lab 04/27/19 1300 04/27/19 1310  WBC 15.8*  --   NEUTROABS 10.3*  --   HGB 9.9* 11.2*  HCT 31.8* 33.0*  MCV 88.3  --   PLT 266  --     Basic  Metabolic Panel: Recent Labs  Lab 04/27/19 1300 04/27/19 1310  NA 138 136  K 5.6* 5.5*  CL 96* 100  CO2 22  --   GLUCOSE 237* 238*  BUN 94* 101*  CREATININE 14.70* 13.60*  CALCIUM 8.3*  --   MG 2.8*  --   PHOS 7.9*  --    GFR: Estimated Creatinine Clearance: 7.9 mL/min (A) (by C-G formula based on SCr of 13.6 mg/dL (H)). Recent Labs  Lab 04/27/19 1300 04/27/19 1307  WBC 15.8*  --   LATICACIDVEN  --  8.7*    Liver Function Tests: Recent Labs  Lab 04/27/19 1300  AST 59*  ALT 120*  ALKPHOS 74  BILITOT 1.1  PROT 7.1  ALBUMIN 3.6   No results for input(s): LIPASE, AMYLASE in the last 168 hours. No results for input(s): AMMONIA in the last 168 hours.  ABG    Component Value Date/Time   PHART 7.484 (H) 04/14/2019 0419   PCO2ART 39.0 04/14/2019 0419   PO2ART 80.0 (L) 04/14/2019 0419   HCO3 29.2 (H) 04/14/2019 0419   TCO2 24 04/27/2019 1310   O2SAT 96.0 04/14/2019 0419     Coagulation Profile: No results for input(s): INR, PROTIME in the last 168 hours.  Cardiac Enzymes: No results for input(s): CKTOTAL, CKMB, CKMBINDEX, TROPONINI in the last 168 hours.  HbA1C: Hemoglobin-A1c  Date/Time Value Ref Range Status  09/14/2009 09:43 AM 8.5 (H) 5.4 - 7.4 % Final   Hemoglobin A1C  Date/Time Value Ref Range Status  11/21/2016 02:33 PM 5.3  Final  04/02/2016 10:05 AM 6.1  Final   HbA1c, POC (controlled diabetic range)  Date/Time Value Ref Range Status  10/21/2018 11:25 AM 5.7 0.0 - 7.0 % Final   Hgb A1c MFr Bld  Date/Time Value Ref Range Status  04/14/2019 02:00 AM 5.8 (H) 4.8 - 5.6 % Final    Comment:    (NOTE) Pre diabetes:          5.7%-6.4% Diabetes:              >6.4% Glycemic control for   <7.0% adults with diabetes     CBG: No results for input(s): GLUCAP in the last 168 hours.  Review of Systems:   Unable to participate due to critical illness  Past Medical History  He,  has a past medical history of CARDIAC ARREST (11/01/2009), Colon  polyps (01/02/2016), Coronary artery disease (08/15/2009), Dialysis patient [facility]) (09-27-2014), Diverticulosis of colon without hemorrhage (01/02/2016), DM (diabetes mellitus), type 2 with renal complications ([place]) (12/25/6267), Essential hypertension (11/01/2009), Hyperlipidemia, Hypertension, Mitral valve regurgitation (09/19/2015), OSA treated with BiPAP (06/17/2010), and Sleep apnea.   Surgical History    Past Surgical History:  Procedure Laterality Date  . AV FISTULA PLACEMENT Left 09/27/2014  . INSERTION OF ARTERIOVENOUS (AV) ARTEGRAFT ARM Left 04/02/2018   Procedure: INSERTION OF ARTERIOVENOUS (AV) ARTEGRAFT ARM LEFT UPPER ARM;  Surgeon: Waynetta Sandy, MD;  Location: [place];  Service: Vascular;  Laterality: Left;  . INSERTION OF DIALYSIS CATHETER N/A 04/02/2018   Procedure: INSERTION OF DIALYSIS CATHETER, right internal jugular;  Surgeon: Waynetta Sandy, MD;  Location: [place];  Service: Vascular;  Laterality: N/A;  . NECK SURGERY     C6 & C7 replaced 30 yrs ago per pt  . REVISON OF ARTERIOVENOUS FISTULA Left 04/02/2018   Procedure: REVISION PLICATION OF ARTERIOVENOUS FISTULA ARM;  Surgeon: Waynetta Sandy, MD;  Location: [place];  Service: Vascular;  Laterality: Left;  . WISDOM TOOTH EXTRACTION       Social History   reports that he quit smoking about 7 years ago. His smoking use included cigars. He has a 10.00 pack-year smoking history. He has never used smokeless tobacco. He reports current alcohol use of about 1.0 standard drinks of alcohol per week. He reports that he does not use drugs.   Family History   His family history includes Alcohol abuse in his brother; Aneurysm (age of onset: 55) in his father; Diabetes in his brother; Heart disease (age of onset: 54) in his mother; Heart disease (age of onset: 86) in his father; Hypertension in his brother, brother, father, and mother; Pancreatitis in his brother. There is no history of Colon cancer.   Allergies No  Known Allergies   Home Medications  Prior to Admission medications   Medication Sig Start Date End Date Taking? Authorizing Provider  albuterol (VENTOLIN HFA) 108 (90 Base) MCG/ACT inhaler Inhale 1-2 puffs into the lungs every 6 (six) hours as needed for wheezing or shortness of breath. 04/20/19   Daisy Floro, DO  allopurinol (ZYLOPRIM) 100 MG tablet Take 100 mg by mouth 2 (two) times daily.  10/04/14   [provider]  amLODipine (NORVASC) 10 MG tablet Take 10 mg by mouth daily.    [provider]  aspirin 81 MG tablet Take 81 mg by mouth daily. Reported on 01/02/2016    [provider]  atorvastatin (LIPITOR) 80 MG tablet Take 1 tablet (80 mg total) by mouth daily. 04/21/19   Daisy Floro, DO  AURYXIA 1 GM 210 MG(Fe) tablet Take 420-840 mg by mouth See admin instructions. 840 mg with meals, and 420 mg with snacks 01/11/19   [provider]  carvedilol (COREG) 25 MG tablet Take 1 tablet (25 mg total) by mouth 2 (two) times daily with a meal. 08/31/15   [name], MD  cetirizine (ZYRTEC) 5 MG tablet Take 1 tablet (5 mg total) by mouth daily. Patient taking differently: Take 5 mg by mouth daily as needed for allergies.  10/21/18   [name], MD  colchicine 0.6 MG tablet Take 0.6 mg by mouth 2 (two) times daily as needed (gout).  12/11/10   [provider]  gabapentin (NEURONTIN) 300 MG capsule TAKE ONE CAPSULE BY MOUTH THREE TIMES DAILY Patient taking differently: Take 300 mg by mouth at bedtime.  09/30/16   [name], MD  glipiZIDE (GLUCOTROL) 5 MG tablet Take 5 mg by mouth daily. 10/04/14   [provider]  linagliptin (TRADJENTA) 5 MG TABS tablet Take 1 tablet (5 mg total) by mouth daily. 04/20/19   Daisy Floro, DO  minoxidil (LONITEN) 10 MG tablet Take 2 tablets (20 mg total) by mouth 2 (two) times daily. 09/11/17   Zenia Resides, MD  multivitamin (RENA-VIT) TABS tablet Take 1 tablet by mouth at bedtime.     [provider]  PRESCRIPTION MEDICATION Pt has CPAP machine    [provider]  torsemide (DEMADEX) 100 MG tablet Take 100 mg by mouth See admin instructions. Take on Tuesday, Thursday, Saturday, and Sunday     [provider]     Critical care time: 65 min    The patient is critically ill with multiple organ systems failure and requires high complexity decision making for assessment and support, frequent evaluation and titration of therapies, application of advanced monitoring technologies and extensive interpretation of multiple databases.   Critical Care Time devoted to patient care services described in this note is 65 Minutes. This time reflects time of care of this signee Dr. Rodman Pickle.   Rodman Pickle, M.D. [facility] Pulmonary/Critical Care Medicine 04/27/2019 2:21 PM  Pager: [phone] After hours pager: [phone]

## 2019-04-27 NOTE — ED Notes (Signed)
Patient 2 necklaces in red bag and then patient belongings bag with label on.

## 2019-04-27 NOTE — Progress Notes (Signed)
ANTICOAGULATION CONSULT NOTE - Initial Consult  Pharmacy Consult for heparin Indication: pulmonary embolus  No Known Allergies  Patient Measurements: Height: 5\' 10"  (177.8 cm) Weight: 253 lb 8.5 oz (115 kg) IBW/kg (Calculated) : 73 Heparin Dosing Weight: 98.4kg   Vital Signs: Temp: 95.1 F (35.1 C) (11/10 1455) Temp Source: Temporal (11/10 1340) BP: 188/88 (11/10 1445) Pulse Rate: 92 (11/10 1455)  Labs: Recent Labs    04/27/19 1300 04/27/19 1310 04/27/19 1419  HGB 9.9* 11.2*  --   HCT 31.8* 33.0*  --   PLT 266  --   --   LABPROT  --   --  13.9  INR  --   --  1.1  CREATININE 14.70* 13.60*  --   TROPONINIHS 98*  --   --     Estimated Creatinine Clearance: 7.9 mL/min (A) (by C-G formula based on SCr of 13.6 mg/dL (H)).   Medical History: Past Medical History:  Diagnosis Date  . CARDIAC ARREST 11/01/2009   Qualifier: Diagnosis of  By: Selena Batten CMA, Jewel    . Colon polyps 01/02/2016   Colonoscopy July 2017 - One 3 mm polyp in the transverse colon, removed with a cold biopsy forceps. Resected and retrieved. - One 3 mm polyp in the rectum, removed with a cold biopsy forceps. Resected and retrieved. - Diverticulosis in the entire examined colon. - Non-bleeding internal hemorrhoids. - The examination was otherwise normal. - Significant looping which prolonged cecal    . Coronary artery disease 08/15/2009   with AMI  . Dialysis patient [facility]) 09-27-2014   mon, wed, and fri  . Diverticulosis of colon without hemorrhage 01/02/2016   Colonoscopy July 2017 - One 3 mm polyp in the transverse colon, removed with a cold biopsy forceps. Resected and retrieved. - One 3 mm polyp in the rectum, removed with a cold biopsy forceps. Resected and retrieved. - Diverticulosis in the entire examined colon. - Non-bleeding internal hemorrhoids. - The examination was otherwise normal. - Significant looping which prolonged cecal    . DM (diabetes mellitus), type 2 with renal complications  ([place]) 6/72/0947  . Essential hypertension 11/01/2009   Qualifier: Diagnosis of  By: Selena Batten CMA, Jewel    . Hyperlipidemia   . Hypertension   . Mitral valve regurgitation 09/19/2015   Echo 09/2015   . OSA treated with BiPAP 06/17/2010  . Sleep apnea    cpap nightly      Assessment: 53 yo male presented on 04/27/2019 post CPR. Prior to presentation, patient was at dialysis when he become SOB, diaphoretic and pale when EMS arrived the patient was unresponsive and during transport patient went into PEA. Chest x-ray on 04/27/2019 found diffuse severe bilateral pulmonary infiltrates. Pharmacy consulted to dose heparin for PE. Patient does not appear to be on anticoagulation prior to admission (medication history pending). Hgb 11.2 Plt 266. No reported bleeding.    Goal of Therapy:  Heparin level 0.3-0.7 units/ml Monitor platelets by anticoagulation protocol: Yes   Plan:  Heparin 6500 units x1  Start heparin 1700 units/hr  Check heparin at 2330 on 11/10 Monitor heparin level, CBC, and S/S of bleeding daily   Cristela Felt, PharmD PGY1 Pharmacy Resident [employer]: [phone]  04/27/2019,3:07 PM

## 2019-04-27 NOTE — ED Triage Notes (Signed)
Arrived via EMS post CPR original called out for shortness of breath at the dialysis center. Patient drove self and upon arrival sat in a chair told staff he was SOB, diaphoretic, and pale. EMS arrived patient unresponsive  Pulse oximetry 71% Room air HR 40's. During transport patient PEA no pulse CPR started 13 minutes administered one amp EPI, one amp calcium, and 1/2 amp sodium bicarb in right IO. Pulses returned BVM upon arrival to ED with pulses

## 2019-04-27 NOTE — ED Notes (Signed)
Advanced NG per radiology.

## 2019-04-27 NOTE — Progress Notes (Signed)
PCCM Interval Note  I updated his wife, Beauford Lando, regarding patient's critical condition including cardiac arrest and respiratory failure requiring mechanical ventilation. She confirmed that she wishes to continue full medical support including CPR if needed.   Rodman Pickle, M.D. [facility] Pulmonary/Critical Care Medicine 04/27/2019 4:49 PM

## 2019-04-27 NOTE — Progress Notes (Signed)
Dr. Justin Mend made aware of pt's hypertension for potential HD.  Deferred to Dr. Lucile Shutters for BP management at this time.  Awaiting new orders.

## 2019-04-27 NOTE — ED Notes (Signed)
Shoes socks added to belonging bags. Patient also came with a black duffle bag.

## 2019-04-27 NOTE — ED Notes (Signed)
Called radiology for 2nd xray.

## 2019-04-27 NOTE — ED Provider Notes (Addendum)
[place] EMERGENCY DEPARTMENT Provider Note   CSN: [id number] Arrival date & time:        History   Chief Complaint Chief Complaint  Patient presents with  . Cardiac Arrest    HPI Troy Wells is a 53 y.o. male.     HPI Patient was discharged in the hospital 7 days ago after admission for Covid associated pneumonia.  That admission had active problems of acute hypoxemic respiratory failure NSTEMI during admission and chronic comorbid conditions of ESRD on dialysis, diabetes, hypertension.  Patient went to dialysis today.  Reportedly he got there and sat down in the chair and told staff he was short of breath.  He was pale and sweaty.  EMS was called prior to any initiation of dialysis.  On arrival they report his pulse ox was at 71% on room air and heart rate in the 40s.  Patient was lethargic.  Just at the point of trying to transfer him to a stretcher, patient went unresponsive and pulseless.  CPR was initiated by EMS.  They report 13 minutes of CPR with administration of 1 amp of epinephrine and 1 amp of calcium gluconate.  They were also able to push half amp of sodium bicarb.  A King airway was placed.  Pulses returned after resuscitation.  Patient arrived with femoral pulses.  No history at this time of the patient's condition between his hospital discharge 6 days ago and today's event. Past Medical History:  Diagnosis Date  . CARDIAC ARREST 11/01/2009   Qualifier: Diagnosis of  By: Selena Batten CMA, Jewel    . Colon polyps 01/02/2016   Colonoscopy July 2017 - One 3 mm polyp in the transverse colon, removed with a cold biopsy forceps. Resected and retrieved. - One 3 mm polyp in the rectum, removed with a cold biopsy forceps. Resected and retrieved. - Diverticulosis in the entire examined colon. - Non-bleeding internal hemorrhoids. - The examination was otherwise normal. - Significant looping which prolonged cecal    . Coronary artery disease  08/15/2009   with AMI  . Dialysis patient [facility]) 09-27-2014   mon, wed, and fri  . Diverticulosis of colon without hemorrhage 01/02/2016   Colonoscopy July 2017 - One 3 mm polyp in the transverse colon, removed with a cold biopsy forceps. Resected and retrieved. - One 3 mm polyp in the rectum, removed with a cold biopsy forceps. Resected and retrieved. - Diverticulosis in the entire examined colon. - Non-bleeding internal hemorrhoids. - The examination was otherwise normal. - Significant looping which prolonged cecal    . DM (diabetes mellitus), type 2 with renal complications ([place]) 12/16/6376  . Essential hypertension 11/01/2009   Qualifier: Diagnosis of  By: Selena Batten CMA, Jewel    . Hyperlipidemia   . Hypertension   . Mitral valve regurgitation 09/19/2015   Echo 09/2015   . OSA treated with BiPAP 06/17/2010  . Sleep apnea    cpap nightly    Patient Active Problem List   Diagnosis Date Noted  . Cardiac arrest ([place]) 04/27/2019  . Elevated transaminase measurement 04/23/2019  . Non-ST elevation (NSTEMI) myocardial infarction ([place])   . QT prolongation 04/16/2019  . COVID-19 virus detected 04/14/2019  . Pulmonary hypertension ([place]) 04/14/2019  . Lower respiratory tract infection due to COVID-19 virus 04/14/2019  . Acute hypoxemic respiratory failure ([place]) 04/14/2019  . Morbid obesity ([place]) 04/14/2019  . Gout 10/21/2018  . Diabetic neuropathy ([place]) 04/02/2016  . Hypertension associated with diabetes ([place]) 10/03/2015  . HLD (  hyperlipidemia) 09/06/2015  . End-stage renal disease on hemodialysis ([place]) 08/31/2015  . DM (diabetes mellitus), type 2 with renal complications ([place]) 02/54/2706  . OSA treated with BiPAP 12/12/2013  . Coronary atherosclerosis 11/01/2009    Past Surgical History:  Procedure Laterality Date  . AV FISTULA PLACEMENT Left 09/27/2014  . INSERTION OF ARTERIOVENOUS (AV) ARTEGRAFT ARM Left 04/02/2018   Procedure: INSERTION OF ARTERIOVENOUS (AV) ARTEGRAFT ARM LEFT UPPER ARM;   Surgeon: Waynetta Sandy, MD;  Location: [place];  Service: Vascular;  Laterality: Left;  . INSERTION OF DIALYSIS CATHETER N/A 04/02/2018   Procedure: INSERTION OF DIALYSIS CATHETER, right internal jugular;  Surgeon: Waynetta Sandy, MD;  Location: [place];  Service: Vascular;  Laterality: N/A;  . NECK SURGERY     C6 & C7 replaced 30 yrs ago per pt  . REVISON OF ARTERIOVENOUS FISTULA Left 04/02/2018   Procedure: REVISION PLICATION OF ARTERIOVENOUS FISTULA ARM;  Surgeon: Waynetta Sandy, MD;  Location: [place];  Service: Vascular;  Laterality: Left;  . WISDOM TOOTH EXTRACTION          Home Medications    Prior to Admission medications   Medication Sig Start Date End Date Taking? Authorizing Provider  albuterol (VENTOLIN HFA) 108 (90 Base) MCG/ACT inhaler Inhale 1-2 puffs into the lungs every 6 (six) hours as needed for wheezing or shortness of breath. 04/20/19   Daisy Floro, DO  allopurinol (ZYLOPRIM) 100 MG tablet Take 100 mg by mouth 2 (two) times daily.  10/04/14   [provider]  amLODipine (NORVASC) 10 MG tablet Take 10 mg by mouth daily.    [provider]  aspirin 81 MG tablet Take 81 mg by mouth daily. Reported on 01/02/2016    [provider]  atorvastatin (LIPITOR) 80 MG tablet Take 1 tablet (80 mg total) by mouth daily. 04/21/19   Daisy Floro, DO  AURYXIA 1 GM 210 MG(Fe) tablet Take 420-840 mg by mouth See admin instructions. 840 mg with meals, and 420 mg with snacks 01/11/19   [provider]  carvedilol (COREG) 25 MG tablet Take 1 tablet (25 mg total) by mouth 2 (two) times daily with a meal. 08/31/15   [name], MD  cetirizine (ZYRTEC) 5 MG tablet Take 1 tablet (5 mg total) by mouth daily. Patient taking differently: Take 5 mg by mouth daily as needed for allergies.  10/21/18   [name], MD  colchicine 0.6 MG tablet Take 0.6 mg by mouth 2 (two) times daily as needed (gout).  12/11/10   [provider]  gabapentin (NEURONTIN) 300 MG capsule TAKE ONE CAPSULE BY MOUTH THREE TIMES DAILY Patient taking differently: Take 300 mg by mouth at bedtime.  09/30/16   [name], MD  glipiZIDE (GLUCOTROL) 5 MG tablet Take 5 mg by mouth daily. 10/04/14   [provider]  linagliptin (TRADJENTA) 5 MG TABS tablet Take 1 tablet (5 mg total) by mouth daily. 04/20/19   Daisy Floro, DO  minoxidil (LONITEN) 10 MG tablet Take 2 tablets (20 mg total) by mouth 2 (two) times daily. 09/11/17   Zenia Resides, MD  multivitamin (RENA-VIT) TABS tablet Take 1 tablet by mouth at bedtime.    [provider]  PRESCRIPTION MEDICATION Pt has CPAP machine    [provider]  torsemide (DEMADEX) 100 MG tablet Take 100 mg by mouth See admin instructions. Take on Tuesday, Thursday, Saturday, and Sunday     [provider]    Family History Family History  Problem Relation Age of Onset  . Hypertension Mother   . Heart disease Mother 66       cause of death  . Hypertension Father   . Aneurysm Father 81       brain aneurysm  . Heart disease Father 48       cause of death  . Hypertension Brother   . Alcohol abuse Brother   . Diabetes Brother   . Pancreatitis Brother   . Hypertension Brother   . Colon cancer Neg Hx     Social History Social History   Tobacco Use  . Smoking status: Former Smoker    Packs/day: 1.00    Years: 10.00    Pack years: 10.00    Types: Cigars    Quit date: 06/18/2011    Years since quitting: 7.8  . Smokeless tobacco: Never Used  Substance Use Topics  . Alcohol use: Yes    Alcohol/week: 1.0 standard drinks    Types: 1 Standard drinks or equivalent per week    Comment: beer or liquor occasionally  . Drug use: No     Allergies   Patient has no known allergies.   Review of Systems Review of Systems Level 5 caveat cannot obtain review of systems.  Physical Exam Updated Vital Signs BP (!) 160/80   Pulse 91   Temp (!)  95.2 F (35.1 C)   Resp (!) 22   Ht 5\' 10"  (1.778 m)   Wt 115 kg   SpO2 99%   BMI 36.38 kg/m   Physical Exam Constitutional:      Comments: Patient arrives unresponsive with a King airway in place.  He is actively being bagged.  Color is very pale.  He is very diaphoretic.  No purposeful movements.  Unresponsive to pain.  HENT:     Head: Normocephalic and atraumatic.     Mouth/Throat:     Comments: Extensive frothy pink-tinged secretions around the mouth and in the tube. Eyes:     Comments: Pupils are 2 to 3 mm and symmetric.  Cardiovascular:     Comments: Heart is and regular. Pulmonary:     Comments: First auscultation is with [facility] airway in place.  A lot of respiratory noise with bagging.  Soft airflow and lung fields. Abdominal:     Comments: Abdomen seems slightly distended but is soft.  Musculoskeletal:     Comments: No gross extremity deformities.  Skin:    Comments: Skin is pale and diaphoretic.  Neurological:     Comments: Patient is obtunded.  No pain response.  After.  Resuscitation he was making some spontaneous respiratory effort.  Patient did not purposely move any extremities throughout time of resuscitation.  Patient does not withdraw from pain.      ED Treatments / Results  Labs (all labs ordered are listed, but only abnormal results are displayed) Labs Reviewed  CBC WITH DIFFERENTIAL/PLATELET - Abnormal; Notable for the following components:      Result Value   WBC 15.8 (*)    RBC 3.60 (*)    Hemoglobin 9.9 (*)    HCT 31.8 (*)    RDW 16.8 (*)    nRBC 1.0 (*)    Neutro Abs 10.3 (*)    Monocytes Absolute 1.1 (*)    Abs Immature Granulocytes 0.57 (*)    All other components within normal limits  COMPREHENSIVE METABOLIC PANEL - Abnormal; Notable for the following components:  Potassium 5.6 (*)    Chloride 96 (*)    Glucose, Bld 237 (*)    BUN 94 (*)    Creatinine, Ser 14.70 (*)    Calcium 8.3 (*)    AST 59 (*)    ALT 120 (*)    GFR calc non Af  Amer 3 (*)    GFR calc Af Amer 4 (*)    Anion gap 20 (*)    All other components within normal limits  LACTIC ACID, PLASMA - Abnormal; Notable for the following components:   Lactic Acid, Venous 8.7 (*)    All other components within normal limits  MAGNESIUM - Abnormal; Notable for the following components:   Magnesium 2.8 (*)    All other components within normal limits  PHOSPHORUS - Abnormal; Notable for the following components:   Phosphorus 7.9 (*)    All other components within normal limits  I-STAT CHEM 8, ED - Abnormal; Notable for the following components:   Potassium 5.5 (*)    BUN 101 (*)    Creatinine, Ser 13.60 (*)    Glucose, Bld 238 (*)    Calcium, Ion 1.03 (*)    Hemoglobin 11.2 (*)    HCT 33.0 (*)    All other components within normal limits  TROPONIN I (HIGH SENSITIVITY) - Abnormal; Notable for the following components:   Troponin I (High Sensitivity) 98 (*)    All other components within normal limits  CULTURE, BLOOD (ROUTINE X 2)  CULTURE, BLOOD (ROUTINE X 2)  CULTURE, RESPIRATORY  PROTIME-INR  LACTIC ACID, PLASMA  AMMONIA  URINALYSIS, ROUTINE W REFLEX MICROSCOPIC  LACTIC ACID, PLASMA  LACTIC ACID, PLASMA  PROCALCITONIN  BRAIN NATRIURETIC PEPTIDE  MAGNESIUM  CBC  STREP PNEUMONIAE URINARY ANTIGEN  LEGIONELLA PNEUMOPHILA SEROGP 1 UR AG  BLOOD GAS, ARTERIAL  APTT  HEPARIN LEVEL (UNFRACTIONATED)  TROPONIN I (HIGH SENSITIVITY)    EKG None  EKG will not import from use at this time.  Tracing shows sinus rhythm.  It is consistent with prior tracings except notable peaking of T waves.  Radiology Dg Chest Portable 1 View  Result Date: 04/27/2019 CLINICAL DATA:  Status post intubation. EXAM: PORTABLE CHEST 1 VIEW COMPARISON:  Same day. FINDINGS: Endotracheal tube is in grossly good position. Nasogastric tube tip is seen in distal esophagus. Stable cardiomediastinal silhouette. Stable bilateral lung opacities are noted concerning for edema or pneumonia.  No pneumothorax is noted. Bony thorax is unremarkable. IMPRESSION: Endotracheal tube in grossly good position. Distal tip of nasogastric tube is seen in distal esophagus; advancement is recommended. Stable bilateral lung opacities are noted as described above. Electronically Signed   By: Marijo Conception M.D.   On: 04/27/2019 14:44   Dg Chest Portable 1 View  Result Date: 04/27/2019 CLINICAL DATA:  COVID-19.  Intubation. EXAM: PORTABLE CHEST 1 VIEW COMPARISON:  04/13/2019. FINDINGS: What appears to be NG tube noted with tip over mid esophagus. An endotracheal tube is difficult to identify and its tip may be in the upper most portion of the trachea, clinical correlation suggested. Cardiomegaly. Diffuse severe bilateral pulmonary infiltrates. No pleural effusion or pneumothorax. IMPRESSION: 1. What appears to be NG tube noted with tip over mid esophagus. Repositioning should be considered. Endotracheal tube is difficult to identify, its tip may be within the upper most portion of the visualized trachea. Clinical correlation suggested. 2. Cardiomegaly. Diffuse severe bilateral pulmonary infiltrates/edema. Critical Value/emergent results were called by telephone at the time of interpretation on 04/27/2019 at  1:36 pm to nurse [place] who verbally acknowledged these results. Electronically Signed   By: Marcello Moores  Register   On: 04/27/2019 13:37    Procedures Procedures (including critical care time) CRITICAL CARE Performed by: Charlesetta Shanks   Total critical care time:60 minutes  Critical care time was exclusive of separately billable procedures and treating other patients.  Critical care was necessary to treat or prevent imminent or life-threatening deterioration.  Critical care was time spent personally by me on the following activities: development of treatment plan with patient and/or surrogate as well as nursing, discussions with consultants, evaluation of patient's response to treatment, examination of  patient, obtaining history from patient or surrogate, ordering and performing treatments and interventions, ordering and review of laboratory studies, ordering and review of radiographic studies, pulse oximetry and re-evaluation of patient's condition.  Angiocath insertion Performed by: Charlesetta Shanks  Vein Location: left EJ  No Ultrasound Guided  Gauge: 20 g  Normal blood return and flush without difficulty Patient tolerance: Patient tolerated the procedure well with no immediate complications.   Medications Ordered in ED Medications  propofol (DIPRIVAN) 1000 MG/100ML infusion (20 mcg/kg/min  115 kg Intravenous New Bag/Given 04/27/19 1337)  propofol (DIPRIVAN) 1000 MG/100ML infusion (has no administration in time range)  fentaNYL (SUBLIMAZE) injection 50 mcg (has no administration in time range)  fentaNYL 2553mcg in NS 235mL (22mcg/ml) infusion-PREMIX (has no administration in time range)  fentaNYL (SUBLIMAZE) bolus via infusion 50 mcg (has no administration in time range)  midazolam (VERSED) injection 2 mg (has no administration in time range)  midazolam (VERSED) injection 2 mg (has no administration in time range)  pantoprazole sodium (PROTONIX) 40 mg/20 mL oral suspension 40 mg (has no administration in time range)  famotidine (PEPCID) 40 MG/5ML suspension 20 mg (has no administration in time range)  acetaminophen (TYLENOL) tablet 650 mg (has no administration in time range)  albuterol (PROVENTIL) (2.5 MG/3ML) 0.083% nebulizer solution 2.5 mg (has no administration in time range)  insulin aspart (novoLOG) injection 2-6 Units (has no administration in time range)  heparin bolus via infusion 6,500 Units (has no administration in time range)  heparin ADULT infusion 100 units/mL (25000 units/236mL sodium chloride 0.45%) (has no administration in time range)  calcium gluconate 1 g/ 50 mL sodium chloride IVPB (0 g Intravenous Stopped 04/27/19 1327)  sodium bicarbonate injection 50 mEq  (50 mEq Intravenous Given 04/27/19 1313)  rocuronium (ZEMURON) injection 100 mg (100 mg Intravenous Given 04/27/19 1326)  etomidate (AMIDATE) injection 20 mg (20 mg Intravenous Given 04/27/19 1325)     Initial Impression / Assessment and Plan / ED Course  I have reviewed the triage vital signs and the nursing notes.  Pertinent labs & imaging results that were available during my care of the patient were reviewed by me and considered in my medical decision making (see chart for details).  Clinical Course as of Apr 27 1527  Tue Apr 27, 2019  1359 Consult: Reviewed with Dr. Burnett Sheng.  Advises to have CCM see the patient and continue care.  Dr. Burnett Sheng will consult.  Does not plan to imminently dialyze before CCM consult.   [MP]  1409 Consult: Reviewed with Dr. Lamonte Sakai.  Accepts for admission.   [MP]  1527 Patient's wife and daughter were updated to the point of current condition.   [MP]    Clinical Course User Index [MP] Charlesetta Shanks, MD      Patient presents as outlined above.  Chest x-ray shows gross vascular congestion.  Patient did  maintain pulses from the time of his arrival to the emergency department throughout the resuscitation that ensued.  Patient was reintubated and placed on the ventilator without difficulty.  Were able to transition him from [facility] airway to endotracheal tube without interrupting oxygenation.  Patient did have a EKG showing very peaked T waves.  I suspect that his response to resuscitation was particularly facilitated by administration of calcium and sodium bicarbonate.  An additional calcium gluconate administered in the emergency department as well as 1 amp of sodium bicarb.  She has maintained a narrow complex regular rhythm since that time with a palpable pulse.  Patient reportedly had Covid as of a week ago.  Intensivist is consulted for admission and ongoing management.  Troy Wells was evaluated in Emergency Department on 04/27/2019 for the symptoms  described in the history of present illness. He was evaluated in the context of the global COVID-19 pandemic, which necessitated consideration that the patient might be at risk for infection with the SARS-CoV-2 virus that causes COVID-19. Institutional protocols and algorithms that pertain to the evaluation of patients at risk for COVID-19 are in a state of rapid change based on information released by regulatory bodies including the CDC and federal and state organizations. These policies and algorithms were followed during the patient's care in the ED.  Final Clinical Impressions(s) / ED Diagnoses   Final diagnoses:  Acute respiratory failure with hypoxia ([place])  Respiratory arrest ([place])  Hyperkalemia  Severe comorbid illness  COVID-19    ED Discharge Orders    None       Charlesetta Shanks, MD 04/27/19 1423    Charlesetta Shanks, MD 04/27/19 1528

## 2019-04-27 NOTE — Progress Notes (Signed)
[facility] Progress Note Patient Name: Troy Wells DOB: 01/22/1966 MRN: [id number]   Date of Service  04/27/2019  HPI/Events of Note  Pt is pending dialysis in AM. He is experiencing volume driven hypertension due to  expanded circulating blood volume. Pt has ESRD.  eICU Interventions  Hydralazine 10 mg iv Q 4 hours prn SBP > 160 mmHg ordered.        Kerry Kass Mackinze Criado 04/27/2019, 10:09 PM

## 2019-04-27 NOTE — Progress Notes (Signed)
ANTICOAGULATION CONSULT NOTE   Pharmacy Consult for Heparin Indication: pulmonary embolus  No Known Allergies  Patient Measurements: Height: 5\' 10"  (177.8 cm) Weight: 261 lb 7.5 oz (118.6 kg) IBW/kg (Calculated) : 73 Heparin Dosing Weight: 98.4kg   Vital Signs: Temp: 98.7 F (37.1 C) (11/10 2300) Temp Source: Oral (11/10 2300) BP: 105/52 (11/10 2245) Pulse Rate: 82 (11/10 2300)  Labs: Recent Labs    04/27/19 1300 04/27/19 1310 04/27/19 1419 04/27/19 1548 04/27/19 1659 04/27/19 2240  HGB 9.9* 11.2*  --  10.2* 9.7*  --   HCT 31.8* 33.0*  --  30.0* 28.9*  --   PLT 266  --   --   --  219  --   APTT  --   --   --   --  155*  --   LABPROT  --   --  13.9  --   --   --   INR  --   --  1.1  --   --   --   HEPARINUNFRC  --   --   --   --   --  0.54  CREATININE 14.70* 13.60*  --   --   --   --   TROPONINIHS 98*  --   --   --  143*  --     Estimated Creatinine Clearance: 8 mL/min (A) (by C-G formula based on SCr of 13.6 mg/dL (H)).   Medical History: Past Medical History:  Diagnosis Date  . CARDIAC ARREST 11/01/2009   Qualifier: Diagnosis of  By: Selena Batten CMA, [place]    . Colon polyps 01/02/2016   Colonoscopy July 2017 - One 3 mm polyp in the transverse colon, removed with a cold biopsy forceps. Resected and retrieved. - One 3 mm polyp in the rectum, removed with a cold biopsy forceps. Resected and retrieved. - Diverticulosis in the entire examined colon. - Non-bleeding internal hemorrhoids. - The examination was otherwise normal. - Significant looping which prolonged cecal    . Coronary artery disease 08/15/2009   with AMI  . Dialysis patient [facility] 09-27-2014   mon, wed, and fri  . Diverticulosis of colon without hemorrhage 01/02/2016   Colonoscopy July 2017 - One 3 mm polyp in the transverse colon, removed with a cold biopsy forceps. Resected and retrieved. - One 3 mm polyp in the rectum, removed with a cold biopsy forceps. Resected and retrieved. - Diverticulosis in  the entire examined colon. - Non-bleeding internal hemorrhoids. - The examination was otherwise normal. - Significant looping which prolonged cecal    . DM (diabetes mellitus), type 2 with renal complications ([place]) 3/97/6734  . Essential hypertension 11/01/2009   Qualifier: Diagnosis of  By: Selena Batten CMA, Jewel    . Hyperlipidemia   . Hypertension   . Mitral valve regurgitation 09/19/2015   Echo 09/2015   . OSA treated with BiPAP 06/17/2010  . Sleep apnea    cpap nightly      Assessment: 53 yo male presented on 04/27/2019 post CPR. Prior to presentation, patient was at dialysis when he become SOB, diaphoretic and pale when EMS arrived the patient was unresponsive and during transport patient went into PEA. Chest x-ray on 04/27/2019 found diffuse severe bilateral pulmonary infiltrates. Pharmacy consulted to dose heparin for PE. Patient does not appear to be on anticoagulation prior to admission (medication history pending). Hgb 11.2 Plt 266. No reported bleeding.   11/10 PM update: Initial heparin level therapeutic   Goal of Therapy:  Heparin level  0.3-0.7 units/ml Monitor platelets by anticoagulation protocol: Yes   Plan:  Cont heparin at 1700 units/hr Confirmatory heparin level with AM labs  Narda Bonds, PharmD, [place] Pharmacist Phone: [phone]

## 2019-04-27 NOTE — ED Notes (Signed)
Called radiology to have 2nd xray completed

## 2019-04-27 NOTE — Telephone Encounter (Signed)
Attempted to call again, no answer and now VM is full. Troy Wells, CMA

## 2019-04-27 NOTE — ED Notes (Signed)
AC called back if patient tested positive within 21 days patient does not need another test and treat as if he is positive.

## 2019-04-27 NOTE — Progress Notes (Signed)
Pt.;s SBP is in the 80's , and Nephrologist notified and ordered not to dialyze pt tonight. Ordered rescheduled for tomorrow. Attending RN made aware.

## 2019-04-27 NOTE — Progress Notes (Signed)
  Echocardiogram 2D Echocardiogram has been performed.  Troy Wells M 04/27/2019, 3:37 PM

## 2019-04-28 ENCOUNTER — Inpatient Hospital Stay (HOSPITAL_COMMUNITY): Payer: Medicare Other

## 2019-04-28 DIAGNOSIS — G9340 Encephalopathy, unspecified: Secondary | ICD-10-CM | POA: Diagnosis not present

## 2019-04-28 DIAGNOSIS — I469 Cardiac arrest, cause unspecified: Secondary | ICD-10-CM | POA: Diagnosis not present

## 2019-04-28 DIAGNOSIS — J810 Acute pulmonary edema: Secondary | ICD-10-CM | POA: Diagnosis not present

## 2019-04-28 DIAGNOSIS — G253 Myoclonus: Secondary | ICD-10-CM

## 2019-04-28 DIAGNOSIS — J9601 Acute respiratory failure with hypoxia: Secondary | ICD-10-CM | POA: Diagnosis not present

## 2019-04-28 DIAGNOSIS — R609 Edema, unspecified: Secondary | ICD-10-CM | POA: Diagnosis not present

## 2019-04-28 DIAGNOSIS — G9341 Metabolic encephalopathy: Secondary | ICD-10-CM

## 2019-04-28 LAB — GLUCOSE, CAPILLARY
Glucose-Capillary: 121 mg/dL — ABNORMAL HIGH (ref 70–99)
Glucose-Capillary: 109 mg/dL — ABNORMAL HIGH (ref 70–99)
Glucose-Capillary: 135 mg/dL — ABNORMAL HIGH (ref 70–99)
Glucose-Capillary: 127 mg/dL — ABNORMAL HIGH (ref 70–99)
Glucose-Capillary: 110 mg/dL — ABNORMAL HIGH (ref 70–99)
Glucose-Capillary: 120 mg/dL — ABNORMAL HIGH (ref 70–99)

## 2019-04-28 LAB — POCT I-STAT 7, (LYTES, BLD GAS, ICA,H+H)
Acid-Base Excess: 7 mmol/L — ABNORMAL HIGH (ref 0.0–2.0)
Acid-Base Excess: 4 mmol/L — ABNORMAL HIGH (ref 0.0–2.0)
Bicarbonate: 31 mmol/L — ABNORMAL HIGH (ref 20.0–28.0)
Bicarbonate: 29.1 mmol/L — ABNORMAL HIGH (ref 20.0–28.0)
Calcium, Ion: 0.94 mmol/L — ABNORMAL LOW (ref 1.15–1.40)
Calcium, Ion: 1.05 mmol/L — ABNORMAL LOW (ref 1.15–1.40)
HCT: 26 % — ABNORMAL LOW (ref 39.0–52.0)
HCT: 31 % — ABNORMAL LOW (ref 39.0–52.0)
Hemoglobin: 8.8 g/dL — ABNORMAL LOW (ref 13.0–17.0)
Hemoglobin: 10.5 g/dL — ABNORMAL LOW (ref 13.0–17.0)
O2 Saturation: 100 %
O2 Saturation: 97 %
Patient temperature: 98.7
Patient temperature: 37.2
Potassium: 6 mmol/L — ABNORMAL HIGH (ref 3.5–5.1)
Potassium: 4.1 mmol/L (ref 3.5–5.1)
Sodium: 136 mmol/L (ref 135–145)
Sodium: 138 mmol/L (ref 135–145)
TCO2: 32 mmol/L (ref 22–32)
TCO2: 30 mmol/L (ref 22–32)
pCO2 arterial: 41.3 mmHg (ref 32.0–48.0)
pCO2 arterial: 44.1 mmHg (ref 32.0–48.0)
pH, Arterial: 7.483 — ABNORMAL HIGH (ref 7.350–7.450)
pH, Arterial: 7.428 (ref 7.350–7.450)
pO2, Arterial: 252 mmHg — ABNORMAL HIGH (ref 83.0–108.0)
pO2, Arterial: 86 mmHg (ref 83.0–108.0)

## 2019-04-28 LAB — CBC
HCT: 26.7 % — ABNORMAL LOW (ref 39.0–52.0)
Hemoglobin: 9.1 g/dL — ABNORMAL LOW (ref 13.0–17.0)
MCH: 27.9 pg (ref 26.0–34.0)
MCHC: 34.1 g/dL (ref 30.0–36.0)
MCV: 81.9 fL (ref 80.0–100.0)
Platelets: 215 K/uL (ref 150–400)
RBC: 3.26 MIL/uL — ABNORMAL LOW (ref 4.22–5.81)
RDW: 16.7 % — ABNORMAL HIGH (ref 11.5–15.5)
WBC: 13.2 K/uL — ABNORMAL HIGH (ref 4.0–10.5)
nRBC: 0.2 % (ref 0.0–0.2)

## 2019-04-28 LAB — BASIC METABOLIC PANEL
Anion gap: 19 — ABNORMAL HIGH (ref 5–15)
BUN: 108 mg/dL — ABNORMAL HIGH (ref 6–20)
CO2: 26 mmol/L (ref 22–32)
Calcium: 7.9 mg/dL — ABNORMAL LOW (ref 8.9–10.3)
Chloride: 94 mmol/L — ABNORMAL LOW (ref 98–111)
Creatinine, Ser: 16.39 mg/dL — ABNORMAL HIGH (ref 0.61–1.24)
GFR calc Af Amer: 3 mL/min — ABNORMAL LOW (ref >60)
GFR calc non Af Amer: 3 mL/min — ABNORMAL LOW (ref >60)
Glucose, Bld: 112 mg/dL — ABNORMAL HIGH (ref 70–99)
Potassium: 6.1 mmol/L — ABNORMAL HIGH (ref 3.5–5.1)
Sodium: 139 mmol/L (ref 135–145)

## 2019-04-28 LAB — HEPARIN LEVEL (UNFRACTIONATED): Heparin Unfractionated: 0.68 IU/mL (ref 0.30–0.70)

## 2019-04-28 LAB — MAGNESIUM: Magnesium: 2.4 mg/dL (ref 1.7–2.4)

## 2019-04-28 LAB — BRAIN NATRIURETIC PEPTIDE: B Natriuretic Peptide: 1289 pg/mL — ABNORMAL HIGH (ref 0.0–100.0)

## 2019-04-28 MED ORDER — PHENYLEPHRINE HCL-NACL 10-0.9 MG/250ML-% IV SOLN
INTRAVENOUS | Status: AC
  Administered 2019-04-28: 20 ug/min via INTRAVENOUS
  Filled 2019-04-28: qty 250

## 2019-04-28 MED ORDER — VITAL 1.5 CAL PO LIQD
1000.0000 mL | ORAL | Status: DC
  Administered 2019-04-28 – 2019-05-04 (×7): 1000 mL
  Filled 2019-04-28 (×11): qty 1000

## 2019-04-28 MED ORDER — HEPARIN SODIUM (PORCINE) 1000 UNIT/ML DIALYSIS: 9000.0000 [IU] | Freq: Once | INTRAMUSCULAR | Status: DC

## 2019-04-28 MED ORDER — HEPARIN SODIUM (PORCINE) 1000 UNIT/ML IJ SOLN
INTRAMUSCULAR | Status: AC
  Filled 2019-04-28: qty 1

## 2019-04-28 MED ORDER — PHENYLEPHRINE HCL-NACL 10-0.9 MG/250ML-% IV SOLN
0.0000 ug/min | INTRAVENOUS | Status: DC
  Administered 2019-04-28: 12:00:00 20 ug/min via INTRAVENOUS

## 2019-04-28 MED ORDER — LEVETIRACETAM IN NACL 1000 MG/100ML IV SOLN
1000.0000 mg | Freq: Once | INTRAVENOUS | Status: AC
  Administered 2019-04-28: 1000 mg via INTRAVENOUS
  Filled 2019-04-28: qty 100

## 2019-04-28 MED ORDER — LEVETIRACETAM IN NACL 500 MG/100ML IV SOLN
500.0000 mg | Freq: Once | INTRAVENOUS | Status: AC
  Administered 2019-04-28: 500 mg via INTRAVENOUS
  Filled 2019-04-28: qty 100

## 2019-04-28 MED ORDER — CHLORHEXIDINE GLUCONATE 0.12% ORAL RINSE (MEDLINE KIT)
15.0000 mL | Freq: Two times a day (BID) | OROMUCOSAL | Status: DC
  Administered 2019-04-28 – 2019-05-10 (×25): 15 mL via OROMUCOSAL

## 2019-04-28 MED ORDER — ORAL CARE MOUTH RINSE
15.0000 mL | OROMUCOSAL | Status: DC
  Administered 2019-04-28 – 2019-05-10 (×122): 15 mL via OROMUCOSAL

## 2019-04-28 MED ORDER — LORAZEPAM 2 MG/ML IJ SOLN
4.0000 mg | Freq: Once | INTRAMUSCULAR | Status: AC
  Administered 2019-04-28: 4 mg via INTRAVENOUS
  Filled 2019-04-28: qty 2

## 2019-04-28 MED ORDER — PRO-STAT SUGAR FREE PO LIQD
30.0000 mL | Freq: Four times a day (QID) | ORAL | Status: AC
  Administered 2019-04-28 – 2019-05-09 (×48): 30 mL
  Filled 2019-04-28 (×42): qty 30

## 2019-04-28 NOTE — Progress Notes (Signed)
ANTICOAGULATION CONSULT NOTE   Pharmacy Consult for Heparin Indication: pulmonary embolus  No Known Allergies  Patient Measurements: Height: 5\' 10"  (177.8 cm) Weight: 260 lb 5.8 oz (118.1 kg) IBW/kg (Calculated) : 73 Heparin Dosing Weight: 98.4kg   Vital Signs: Temp: 99.3 F (37.4 C) (11/11 1000) Temp Source: Oral (11/11 0800) BP: 135/76 (11/11 1100) Pulse Rate: 101 (11/11 1100)  Labs: Recent Labs    04/27/19 1300 04/27/19 1310 04/27/19 1419  04/27/19 1659 04/27/19 2240 04/28/19 0425 04/28/19 0622 04/28/19 0951  HGB 9.9* 11.2*  --    < > 9.7*  --  8.8* 9.1* 10.5*  HCT 31.8* 33.0*  --    < > 28.9*  --  26.0* 26.7* 31.0*  PLT 266  --   --   --  219  --   --  215  --   APTT  --   --   --   --  155*  --   --   --   --   LABPROT  --   --  13.9  --   --   --   --   --   --   INR  --   --  1.1  --   --   --   --   --   --   HEPARINUNFRC  --   --   --   --   --  0.54  --  0.68  --   CREATININE 14.70* 13.60*  --   --   --   --   --  16.39*  --   TROPONINIHS 98*  --   --   --  143* 229*  --   --   --    < > = values in this interval not displayed.    Estimated Creatinine Clearance: 6.6 mL/min (A) (by C-G formula based on SCr of 16.39 mg/dL (H)).   Medical History: Past Medical History:  Diagnosis Date  . CARDIAC ARREST 11/01/2009   Qualifier: Diagnosis of  By: Selena Batten CMA, Jewel    . Colon polyps 01/02/2016   Colonoscopy July 2017 - One 3 mm polyp in the transverse colon, removed with a cold biopsy forceps. Resected and retrieved. - One 3 mm polyp in the rectum, removed with a cold biopsy forceps. Resected and retrieved. - Diverticulosis in the entire examined colon. - Non-bleeding internal hemorrhoids. - The examination was otherwise normal. - Significant looping which prolonged cecal    . Coronary artery disease 08/15/2009   with AMI  . Dialysis patient [facility] 09-27-2014   mon, wed, and fri  . Diverticulosis of colon without hemorrhage 01/02/2016   Colonoscopy  July 2017 - One 3 mm polyp in the transverse colon, removed with a cold biopsy forceps. Resected and retrieved. - One 3 mm polyp in the rectum, removed with a cold biopsy forceps. Resected and retrieved. - Diverticulosis in the entire examined colon. - Non-bleeding internal hemorrhoids. - The examination was otherwise normal. - Significant looping which prolonged cecal    . DM (diabetes mellitus), type 2 with renal complications ([place]) 01/27/8870  . Essential hypertension 11/01/2009   Qualifier: Diagnosis of  By: Selena Batten CMA, Jewel    . Hyperlipidemia   . Hypertension   . Mitral valve regurgitation 09/19/2015   Echo 09/2015   . OSA treated with BiPAP 06/17/2010  . Sleep apnea    cpap nightly      Assessment: 53 yo male presented on 04/27/2019 post CPR.  Prior to presentation, patient was at dialysis when he become SOB, diaphoretic and pale when EMS arrived the patient was unresponsive and during transport patient went into PEA. Chest x-ray on 04/27/2019 found diffuse severe bilateral pulmonary infiltrates. PE rule out pending doppler this AM.  Hg, PLT OK. No reported bleeding.   HL currently therapeutic at 0.68 on 1700U/hr, but given continued increase in HL some concern for next level to be supratherapeutic.  Goal of Therapy:  Heparin level 0.3-0.7 units/ml Monitor platelets by anticoagulation protocol: Yes   Plan:  Decrease heparin to 1650U/hr. Check HL with AM labs. Monitor Hg, PLT, signs and symptoms of bleeding.   Onnie Boer; PharmD Candidate

## 2019-04-28 NOTE — Progress Notes (Signed)
[facility] Progress Note  Subjective: didn't get HD overnight due to hypotension in the 80's. ON HD now, getting some fluid off but BP's dropping now.   Vitals:   04/28/19 1015 04/28/19 1030 04/28/19 1045 04/28/19 1100  BP: (!) 95/56 (!) 91/54 91/60 135/76  Pulse: 99 (!) 101 (!) 101 (!) 101  Resp:      Temp:      TempSrc:      SpO2:      Weight:      Height:        Inpatient medications: . chlorhexidine gluconate (MEDLINE KIT)  15 mL Mouth Rinse BID  . Chlorhexidine Gluconate Cloth  6 each Topical Q0600  . famotidine  20 mg Oral Q0600  . feeding supplement (PRO-STAT SUGAR FREE 64)  30 mL Per Tube QID  . heparin      . heparin  9,000 Units Dialysis Once in dialysis  . insulin aspart  2-6 Units Subcutaneous Q4H  . mouth rinse  15 mL Mouth Rinse 10 times per day  . pantoprazole sodium  40 mg Per Tube Daily   . feeding supplement (VITAL 1.5 CAL)    . fentaNYL infusion INTRAVENOUS Stopped (04/27/19 1720)  . heparin 1,700 Units/hr (04/28/19 0251)  . propofol (DIPRIVAN) infusion 10 mcg/kg/min (04/28/19 0248)   acetaminophen, albuterol, fentaNYL, hydrALAZINE, midazolam, midazolam    Exam: Patient not examined today directly given COVID-19 + status, utilizing exam of the primary team and observations of RN's.  Per CCM MD pt w/o peripheral edema, has frothy materials coming up in ETT however.       Home meds:  - amlodipine 10/ carvedilol 25 bid/ minoxidil 20 bid  - linagliptin 5/ glipizide 5   - gabapentin 300 hs  - aspirin 81/ atrovastatin 80  - allopurinol 100 bid  - auryxia / prn's/ vitamins/ supplements     Outpt HD: TTS     4.5h  F250  124.5kg  400/800  2/2.25  Hep 12,000  TDC  - coming off 5-6 kg over dry wt  - mircera 30 q4 last 10/13  - hect 4     Assessment/ Plan: 1. Resp failure/ diffuse pulm infiltrates - recent COVID pna dc'd 7d ago. Severe CXR changes not sure ARDS vs vol overload or both. Is under dry wt, no edema on exam.  Unable to dialyze last night due to transient hypotension, is on HD now, getting some fluid off.  2. ESRD on HD TTS. HD today off sched.  3. HTN - BP's high in ED then dropped then up again 4. SP PEA arrest - en route 5. DM2 - per primary 6. Recent COVID pna - dc'd on 11/3      Troy Wells 04/28/2019, 11:38 AM  Iron/TIBC/Ferritin/ %Sat No results found for: IRON, TIBC, FERRITIN, IRONPCTSAT Recent Labs  Lab 04/27/19 1300  04/27/19 1419  04/28/19 0622 04/28/19 0951  NA 138   < >  --    < > 139 138  K 5.6*   < >  --    < > 6.1* 4.1  CL 96*   < >  --   --  94*  --   CO2 22  --   --   --  26  --   GLUCOSE 237*   < >  --   --  112*  --   BUN 94*   < >  --   --  108*  --   CREATININE  14.70*   < >  --   --  16.39*  --   CALCIUM 8.3*  --   --   --  7.9*  --   PHOS 7.9*  --   --   --   --   --   ALBUMIN 3.6  --   --   --   --   --   INR  --   --  1.1  --   --   --    < > = values in this interval not displayed.   Recent Labs  Lab 04/27/19 1300  AST 59*  ALT 120*  ALKPHOS 74  BILITOT 1.1  PROT 7.1   Recent Labs  Lab 04/28/19 0622 04/28/19 0951  WBC 13.2*  --   HGB 9.1* 10.5*  HCT 26.7* 31.0*  PLT 215  --

## 2019-04-28 NOTE — Progress Notes (Signed)
STAT EEG complete - results pending. ? ?

## 2019-04-28 NOTE — Procedures (Signed)
   I was present at this dialysis session, have reviewed the session itself and made  appropriate changes Kelly Splinter MD [place] pager [phone]   04/28/2019, 11:45 AM

## 2019-04-28 NOTE — Progress Notes (Addendum)
Lower extremity venous has been completed.   Preliminary results in CV Proc.   Bilateral lower extremities are negative for DVT.   Troy Wells 04/28/2019 10:14 AM

## 2019-04-28 NOTE — Consult Note (Signed)
Requesting Physician: Dr. Nelda Marseille    Chief Complaint: Jerking of both legs and arms  History obtained from: Patient and Chart     HPI:                                                                                                                                       Troy Wells is a 53 y.o. male with past medical history significant for coronary artery disease, hypertension, end-stage renal disease on dialysis with recent diagnosis COVID-19 related pneumonia who presented to his dialysis center yesterday with shortness of breath and went into PEA arrest with ROSC of 13 minutes.  Patient was intubated and transferred to [facility] for further management.  Patient did not undergo hypothermia protocol.  He was undergoing dialysis today when he started to have significant jerking of his upper and lower extremities.  Witnessed by me outside the room, was nonrhythmic and no obvious posturing seen.  Patient did not have any upper limb movements of the eyes.  Neurology was consulted for evaluation of this myoclonic activity.  Myoclonic activity stopped with Ativan and patient sedation with propofol was resumed. He was also loaded with 1 g of Keppra.  Stat EEG was obtained (after sedation was started) which showed generalized continuous slowing with no epileptiform discharges. No myoclonus was captured in the EEG.   Past Medical History:  Diagnosis Date  . CARDIAC ARREST 11/01/2009   Qualifier: Diagnosis of  By: Selena Batten CMA, Jewel    . Colon polyps 01/02/2016   Colonoscopy July 2017 - One 3 mm polyp in the transverse colon, removed with a cold biopsy forceps. Resected and retrieved. - One 3 mm polyp in the rectum, removed with a cold biopsy forceps. Resected and retrieved. - Diverticulosis in the entire examined colon. - Non-bleeding internal hemorrhoids. - The examination was otherwise normal. - Significant looping which prolonged cecal    . Coronary artery disease 08/15/2009   with  [facility]  . Dialysis patient [facility]) 09-27-2014   mon, wed, and fri  . Diverticulosis of colon without hemorrhage 01/02/2016   Colonoscopy July 2017 - One 3 mm polyp in the transverse colon, removed with a cold biopsy forceps. Resected and retrieved. - One 3 mm polyp in the rectum, removed with a cold biopsy forceps. Resected and retrieved. - Diverticulosis in the entire examined colon. - Non-bleeding internal hemorrhoids. - The examination was otherwise normal. - Significant looping which prolonged cecal    . DM (diabetes mellitus), type 2 with renal complications ([place]) 0/99/8338  . Essential hypertension 11/01/2009   Qualifier: Diagnosis of  By: Selena Batten CMA, Jewel    . Hyperlipidemia   . Hypertension   . Mitral valve regurgitation 09/19/2015   Echo 09/2015   . OSA treated with BiPAP 06/17/2010  . Sleep apnea    cpap nightly    Past Surgical History:  Procedure Laterality Date  . AV FISTULA  PLACEMENT Left 09/27/2014  . INSERTION OF ARTERIOVENOUS (AV) ARTEGRAFT ARM Left 04/02/2018   Procedure: INSERTION OF ARTERIOVENOUS (AV) ARTEGRAFT ARM LEFT UPPER ARM;  Surgeon: Waynetta Sandy, MD;  Location: [place];  Service: Vascular;  Laterality: Left;  . INSERTION OF DIALYSIS CATHETER N/A 04/02/2018   Procedure: INSERTION OF DIALYSIS CATHETER, right internal jugular;  Surgeon: Waynetta Sandy, MD;  Location: [place];  Service: Vascular;  Laterality: N/A;  . NECK SURGERY     C6 & C7 replaced 30 yrs ago per pt  . REVISON OF ARTERIOVENOUS FISTULA Left 04/02/2018   Procedure: REVISION PLICATION OF ARTERIOVENOUS FISTULA ARM;  Surgeon: Waynetta Sandy, MD;  Location: [place];  Service: Vascular;  Laterality: Left;  . WISDOM TOOTH EXTRACTION      Family History  Problem Relation Age of Onset  . Hypertension Mother   . Heart disease Mother 39       cause of death  . Hypertension Father   . Aneurysm Father 81       brain aneurysm  . Heart disease Father 21       cause of death  .  Hypertension Brother   . Alcohol abuse Brother   . Diabetes Brother   . Pancreatitis Brother   . Hypertension Brother   . Colon cancer Neg Hx    Social History:  reports that he quit smoking about 7 years ago. His smoking use included cigars. He has a 10.00 pack-year smoking history. He has never used smokeless tobacco. He reports current alcohol use of about 1.0 standard drinks of alcohol per week. He reports that he does not use drugs.  Allergies: No Known Allergies  Medications:                                                                                                                        I reviewed home medications   ROS:                                                                                                                                     14 systems reviewed and negative except above    Examination:  General: Appears well-developed and well-nourished.  Psych: Affect appropriate to situation Eyes: No scleral injection HENT: No OP obstrucion Head: Normocephalic.  Cardiovascular: Normal rate and regular rhythm.  Respiratory: Effort normal and breath sounds normal to anterior ascultation GI: Soft.  No distension. There is no tenderness.  Skin: WDI    Neurological Examination Mental Status: Opens eyes to stimulation, does not track or follow any commands Cranial Nerves: II: Pupils are reactive bilaterally III,IV,VI: doll's response present bilaterally  V,VII: corneal reflex: Present VIII: patient does not respond to verbal stimuli IX,X: gag reflex present XI: trapezius strength unable to test bilaterally XII: tongue strength unable to test Motor: Increased tone bilaterally, at times it appears patient has some spontaneous movement in both upper extremities.  Minimal withdrawal in both upper extremities. Sensory: unable to assess Deep Tendon Reflexes: He  has brisk patellar reflexes bilaterally with ankle clonus in the left leg Plantars: Mute Cerebellar: Unable to perform      Lab Results: Basic Metabolic Panel: Recent Labs  Lab 04/27/19 1300 04/27/19 1310 04/27/19 1548 04/27/19 1659 04/28/19 0425 04/28/19 0622 04/28/19 0951  NA 138 136 135  --  136 139 138  K 5.6* 5.5* 6.1*  --  6.0* 6.1* 4.1  CL 96* 100  --   --   --  94*  --   CO2 22  --   --   --   --  26  --   GLUCOSE 237* 238*  --   --   --  112*  --   BUN 94* 101*  --   --   --  108*  --   CREATININE 14.70* 13.60*  --   --   --  16.39*  --   CALCIUM 8.3*  --   --   --   --  7.9*  --   MG 2.8*  --   --  2.5*  --  2.4  --   PHOS 7.9*  --   --   --   --   --   --     CBC: Recent Labs  Lab 04/27/19 1300  04/27/19 1548 04/27/19 1659 04/28/19 0425 04/28/19 0622 04/28/19 0951  WBC 15.8*  --   --  15.9*  --  13.2*  --   NEUTROABS 10.3*  --   --   --   --   --   --   HGB 9.9*   < > 10.2* 9.7* 8.8* 9.1* 10.5*  HCT 31.8*   < > 30.0* 28.9* 26.0* 26.7* 31.0*  MCV 88.3  --   --  83.8  --  81.9  --   PLT 266  --   --  219  --  215  --    < > = values in this interval not displayed.    Coagulation Studies: Recent Labs    04/27/19 1419  LABPROT 13.9  INR 1.1    Imaging: Dg Chest Port 1 View  Result Date: 04/28/2019 CLINICAL DATA:  Respiratory failure. EXAM: PORTABLE CHEST 1 VIEW COMPARISON:  April 27, 2019. FINDINGS: Stable cardiomediastinal silhouette. Endotracheal tube is in grossly good position. Nasogastric tube is seen entering stomach. Bilateral lung opacities noted on prior exam are significantly improved suggesting improving edema or pneumonia. No pneumothorax or significant pleural effusion is noted. Bony thorax is unremarkable. IMPRESSION: Significantly improved bilateral lung opacities as described above. Electronically Signed   By: Marijo Conception M.D.   On: 04/28/2019 08:04  Dg Chest Portable 1 View  Result Date: 04/27/2019 CLINICAL DATA:   Status post intubation. EXAM: PORTABLE CHEST 1 VIEW COMPARISON:  Same day. FINDINGS: Endotracheal tube is in grossly good position. Nasogastric tube tip is seen in distal esophagus. Stable cardiomediastinal silhouette. Stable bilateral lung opacities are noted concerning for edema or pneumonia. No pneumothorax is noted. Bony thorax is unremarkable. IMPRESSION: Endotracheal tube in grossly good position. Distal tip of nasogastric tube is seen in distal esophagus; advancement is recommended. Stable bilateral lung opacities are noted as described above. Electronically Signed   By: Marijo Conception M.D.   On: 04/27/2019 14:44   Dg Chest Portable 1 View  Result Date: 04/27/2019 CLINICAL DATA:  COVID-19.  Intubation. EXAM: PORTABLE CHEST 1 VIEW COMPARISON:  04/13/2019. FINDINGS: What appears to be NG tube noted with tip over mid esophagus. An endotracheal tube is difficult to identify and its tip may be in the upper most portion of the trachea, clinical correlation suggested. Cardiomegaly. Diffuse severe bilateral pulmonary infiltrates. No pleural effusion or pneumothorax. IMPRESSION: 1. What appears to be NG tube noted with tip over mid esophagus. Repositioning should be considered. Endotracheal tube is difficult to identify, its tip may be within the upper most portion of the visualized trachea. Clinical correlation suggested. 2. Cardiomegaly. Diffuse severe bilateral pulmonary infiltrates/edema. Critical Value/emergent results were called by telephone at the time of interpretation on 04/27/2019 at 1:36 pm to nurse [place] who verbally acknowledged these results. Electronically Signed   By: Marcello Moores  Register   On: 04/27/2019 13:37   Vas [place] Lower Extremity Venous (dvt)  Result Date: 04/28/2019  Lower Venous Study Indications: Edema.  Comparison Study: no prior Performing Technologist: Abram Sander RVS  Examination Guidelines: A complete evaluation includes B-mode imaging, spectral Doppler, color Doppler, and power  Doppler as needed of all accessible portions of each vessel. Bilateral testing is considered an integral part of a complete examination. Limited examinations for reoccurring indications may be performed as noted.  +---------+---------------+---------+-----------+----------+--------------+ RIGHT    CompressibilityPhasicitySpontaneityPropertiesThrombus Aging +---------+---------------+---------+-----------+----------+--------------+ CFV      Full           Yes      Yes                                 +---------+---------------+---------+-----------+----------+--------------+ SFJ      Full                                                        +---------+---------------+---------+-----------+----------+--------------+ FV Prox  Full                                                        +---------+---------------+---------+-----------+----------+--------------+ FV Mid   Full                                                        +---------+---------------+---------+-----------+----------+--------------+ FV DistalFull                                                        +---------+---------------+---------+-----------+----------+--------------+  PFV      Full                                                        +---------+---------------+---------+-----------+----------+--------------+ POP      Full           Yes      Yes                                 +---------+---------------+---------+-----------+----------+--------------+ PTV      Full                                                        +---------+---------------+---------+-----------+----------+--------------+ PERO                                                  Not visualized +---------+---------------+---------+-----------+----------+--------------+   +---------+---------------+---------+-----------+----------+--------------+ LEFT      CompressibilityPhasicitySpontaneityPropertiesThrombus Aging +---------+---------------+---------+-----------+----------+--------------+ CFV      Full           Yes      Yes                                 +---------+---------------+---------+-----------+----------+--------------+ SFJ      Full                                                        +---------+---------------+---------+-----------+----------+--------------+ FV Prox  Full                                                        +---------+---------------+---------+-----------+----------+--------------+ FV Mid   Full                                                        +---------+---------------+---------+-----------+----------+--------------+ FV DistalFull                                                        +---------+---------------+---------+-----------+----------+--------------+ PFV      Full                                                        +---------+---------------+---------+-----------+----------+--------------+  POP      Full           Yes      Yes                                 +---------+---------------+---------+-----------+----------+--------------+ PTV      Full                                                        +---------+---------------+---------+-----------+----------+--------------+ PERO                                                  Not visualized +---------+---------------+---------+-----------+----------+--------------+     *See table(s) above for measurements and observations.    Preliminary      I have reviewed the above imaging : CT head not been performed   ASSESSMENT AND PLAN  53 y.o. male with past medical history significant for coronary artery disease, hypertension, end-stage renal disease on dialysis with recent diagnosis COVID-19 related pneumonia who presented to his dialysis center yesterday with shortness of breath and went into  PEA arrest with ROSC of 13 minutes.  Patient had myoclonus while undergoing dialysis when sedation was turned off, myoclonus was not associated with up rolling movement of the eyes.  I witnessed movements from outside the room, it may even be possible patient was gagging as sedation has been weaned. Currently no myoclonic activity and EEG not suggestive of status myoclonus.  On exam patient does have brainstem reflexes intact and appears to withdraw bilateral upper extremities and opens eyes to sternal rub.    Acute metabolic encephalopathy Possible anoxic brain injury following cardiac arrest ? Myoclonic jerks during dialysis    Recommendations Wean sedation as tolerated Continue to wait for neurological recovery for at least 72 hours MRI brain without contrast the patient does not improve Continue long-term EEG: We will monitor for seizures/anoxic myoclonus Continue dialysis Continue management of metabolic and electrolyte abnormalities Continue Keppra 500 mg once daily (renally dosed)   Karena Addison Ardyn Forge [facility] Pager Number [phone]

## 2019-04-28 NOTE — Progress Notes (Addendum)
Initial Nutrition Assessment  DOCUMENTATION CODES:   Obesity unspecified  INTERVENTION:    Vital 1.5 at 60 ml/h (1440 ml per day)   Pro-stat 30 ml QID   Provides 2560 kcal (2742 kcal total with Propofol), 155 gm protein, 1100 ml free water daily  NUTRITION DIAGNOSIS:   Increased nutrient needs related to acute illness(COVID-19) as evidenced by estimated needs.  GOAL:   Patient will meet greater than or equal to 90% of their needs  MONITOR:   Vent status, TF tolerance, Labs  REASON FOR ASSESSMENT:   Ventilator, Consult Enteral/tube feeding initiation and management  ASSESSMENT:   53 yo male admitted in PEA arrest after a syncopal episode at HD center. Intubated on admission. PMH includes recent COVID PNA (positive on 10/28), HTN, CAD, ESRD on HD, HLD, OSA, DM-2.   Received MD Consult for TF initiation and management. NG tube in place.  Patient is currently intubated on ventilator support MV: 12.4 L/min Temp (24hrs), Avg:96.5 F (35.8 C), Min:93.3 F (34.1 C), Max:99.3 F (37.4 C)  Propofol: 6.9 ml/hr providing 182 kcal from lipid.  Labs reviewed. Potassium 6.1-->4.1, BUN 108 (H), creatinine 16.39 (H), ionized calcium 0.94 (L) CBG's: 86-121  Medications reviewed and include novolog, propofol.  No significant weight loss noted over the past year. EDW ~118.5-119.5 kg. Receiving HD for volume removal.   NUTRITION - FOCUSED PHYSICAL EXAM:  unable to complete  Diet Order:   Diet Order            Diet NPO time specified  Diet effective now              EDUCATION NEEDS:   Not appropriate for education at this time  Skin:  Skin Assessment: Reviewed RN Assessment  Last BM:  11/11 type 6  Height:   Ht Readings from Last 1 Encounters:  04/27/19 5\' 10"  (1.778 m)    Weight:   Wt Readings from Last 1 Encounters:  04/28/19 118.1 kg    Ideal Body Weight:  75.5 kg  BMI:  Body mass index is 37.36 kg/m.  Estimated Nutritional Needs:   Kcal:   3833-3832  Protein:  150-180 gm  Fluid:  1 L + UOP    Molli Barrows, RD, LDN, [place] Pager [phone] After Hours Pager [phone]

## 2019-04-28 NOTE — Progress Notes (Signed)
Assisted tele visit to patient with daughter, Marijo Conception.  Maryelizabeth Rowan, RN

## 2019-04-28 NOTE — Progress Notes (Signed)
NAME:  Troy Wells, MRN:  [id number], DOB:  1965/09/29, LOS: 1 ADMISSION DATE:  04/27/2019, CONSULTATION DATE:  04/27/19 REFERRING MD:  Larrie Kass, MD CHIEF COMPLAINT:  PEA arrest  Brief History   53 year old male with ESRD and recent COVID-19 pneumonia who presents after syncopal episode and found in PEA arrest.  History of present illness   Unable to provide history due to critical illness. History obtained by chart review.  53 year old male with ESRD on HD who presents after syncopal episode and found in PEA arrest. Patient presented to dialysis center and on arrival complained of shortness of breath. He was found pale, diaphoretic and hypoxemic to 71% and bradycardic to 40s. During transport, he went into PEA arrest and received CPR for 13 minutes with epi x1, calcium and 1/2 amp sodium bicarb. King airway was placed in the field. On arrival to the ED, kingairway was exchanged for ETT. PCCM consulted for admission.  Of note, patient had recent admission for COVID-19 pneumonia and treated with oxygen, steroids, remdsivir and decadron. Hospital admission significant for NSTEMI and reduced EF. He was scheduled to follow-up with Cardiology to consider outpatient nuclear test. On discharge, he was able to maintain saturations on room air.   Past Medical History  ESRD on HD Chronic anemia Chronic systolic heart failure CAD HTN OSA on CPAP DM2 HLD  Significant Hospital Events   11/10 Admitted  Consults:  PCCM  Procedures:  ETT 11/10>  Significant Diagnostic Tests:  CXR 11/10> Bilateral airspace disease suggestive of volume overload  Micro Data:  BCx 11/10>   Antimicrobials:  Vanc 11/10> Cefepime 11/10>  Interim history/subjective:  No events overnight, no new complaints Tolerating volume negative  Objective   Blood pressure 128/66, pulse 85, temperature 98.7 F (37.1 C), temperature source Oral, resp. rate (!) 30, height 5\' 10"  (1.778 m), weight 118.4 kg, SpO2  99 %.    Vent Mode: PRVC FiO2 (%):  [50 %-100 %] 50 % Set Rate:  [16 bmp-30 bmp] 30 bmp Vt Set:  [400 mL-580 mL] 400 mL PEEP:  [5 cmH20-13 cmH20] 13 cmH20 Plateau Pressure:  [24 cmH20-33 cmH20] 24 cmH20   Intake/Output Summary (Last 24 hours) at 04/28/2019 0900 Last data filed at 04/28/2019 0600 Gross per 24 hour  Intake 340.25 ml  Output 0 ml  Net 340.25 ml   Filed Weights   04/27/19 1314 04/27/19 2000 04/28/19 0238  Weight: 115 kg 118.6 kg 118.4 kg   Physical Exam: General: Chronically ill appearing male, NAD HENT: [place]/AT, PERRL, EOM-I and MMM, ETT in place Respiratory: Coarse BS diffusely Cardiovascular: RRR, Nl S1/S2 and -M/R/G GI: Soft, NT, ND and +BS Extremities: -edema and -tenderness Neuro: Sedate, withdraws to pain but not following commands Skin: Intact, no rashes or bruising  I reviewed CXR myself, ETT is 8 cm above carina  Resolved Hospital Problem list     Assessment & Plan:   PEA arrest preceded by syncope Inciting event may have been hypoxemia related to volume overload however cannot rule out PE especially in setting of recent COVID-19 infection Plan: Continue tele monitoring Echo with normal RV size and function, unlikely PE since responding to diureses and RV is normal LE dopplers pending Continue heparin drip until dopplers are negative  Acute hypoxemic respiratory failure Plan: Decrease PEEP to 8 and FiO2 to 40% Decrease RR as ordered F/U ABG and CXR ordered Wean PEEP/FIO2 for goal SpO2 85-88%. Repeat ABG today Continue to remove volume via HD VAP  If dopplers are negative will d/c heparin drip  Sepsis secondary to possible respiratory source Plan: Hold off abx for now F/U on cultures  ESRD on HD Plan: Appreciate Nephrology input. Plan for HD for volume removal Trend BMP, Mg, Phos  Discussed with bedside RN  Best practice:  Diet: NPO - TF per nutrition Pain/Anxiety/Delirium protocol (if indicated): Fentanyl gtt and PRN fentanyl  and Versed VAP protocol (if indicated): Yes DVT prophylaxis: Heparin gtt GI prophylaxis: Pepcid Glucose control: SSI Mobility:  Code Status: Full Family Communication: Will update family Disposition: Admit to ICU  Labs   CBC: Recent Labs  Lab 04/27/19 1300 04/27/19 1310 04/27/19 1548 04/27/19 1659 04/28/19 0425 04/28/19 0622  WBC 15.8*  --   --  15.9*  --  13.2*  NEUTROABS 10.3*  --   --   --   --   --   HGB 9.9* 11.2* 10.2* 9.7* 8.8* 9.1*  HCT 31.8* 33.0* 30.0* 28.9* 26.0* 26.7*  MCV 88.3  --   --  83.8  --  81.9  PLT 266  --   --  219  --  034   Basic Metabolic Panel: Recent Labs  Lab 04/27/19 1300 04/27/19 1310 04/27/19 1548 04/27/19 1659 04/28/19 0425 04/28/19 0622  NA 138 136 135  --  136 139  K 5.6* 5.5* 6.1*  --  6.0* 6.1*  CL 96* 100  --   --   --  94*  CO2 22  --   --   --   --  26  GLUCOSE 237* 238*  --   --   --  112*  BUN 94* 101*  --   --   --  108*  CREATININE 14.70* 13.60*  --   --   --  16.39*  CALCIUM 8.3*  --   --   --   --  7.9*  MG 2.8*  --   --  2.5*  --  2.4  PHOS 7.9*  --   --   --   --   --    GFR: Estimated Creatinine Clearance: 6.6 mL/min (A) (by C-G formula based on SCr of 16.39 mg/dL (H)). Recent Labs  Lab 04/27/19 1300 04/27/19 1307 04/27/19 1659 04/27/19 1842 04/28/19 0622  PROCALCITON  --   --  1.30  --   --   WBC 15.8*  --  15.9*  --  13.2*  LATICACIDVEN  --  8.7* 1.7 2.3*  --     Liver Function Tests: Recent Labs  Lab 04/27/19 1300  AST 59*  ALT 120*  ALKPHOS 74  BILITOT 1.1  PROT 7.1  ALBUMIN 3.6   No results for input(s): LIPASE, AMYLASE in the last 168 hours. Recent Labs  Lab 04/27/19 1659  AMMONIA 50*    ABG    Component Value Date/Time   PHART 7.483 (H) 04/28/2019 0425   PCO2ART 41.3 04/28/2019 0425   PO2ART 252.0 (H) 04/28/2019 0425   HCO3 31.0 (H) 04/28/2019 0425   TCO2 32 04/28/2019 0425   O2SAT 100.0 04/28/2019 0425     Coagulation Profile: Recent Labs  Lab 04/27/19 1419  INR 1.1     Cardiac Enzymes: No results for input(s): CKTOTAL, CKMB, CKMBINDEX, TROPONINI in the last 168 hours.  HbA1C: Hemoglobin-A1c  Date/Time Value Ref Range Status  09/14/2009 09:43 AM 8.5 (H) 5.4 - 7.4 % Final   Hemoglobin A1C  Date/Time Value Ref Range Status  11/21/2016 02:33 PM 5.3  Final  04/02/2016  10:05 AM 6.1  Final   HbA1c, POC (controlled diabetic range)  Date/Time Value Ref Range Status  10/21/2018 11:25 AM 5.7 0.0 - 7.0 % Final   Hgb A1c MFr Bld  Date/Time Value Ref Range Status  04/14/2019 02:00 AM 5.8 (H) 4.8 - 5.6 % Final    Comment:    (NOTE) Pre diabetes:          5.7%-6.4% Diabetes:              >6.4% Glycemic control for   <7.0% adults with diabetes     CBG: Recent Labs  Lab 04/27/19 1655 04/27/19 1857 04/27/19 2016 04/27/19 2304 04/28/19 0346  GLUCAP 170* 109* 85 86 121*   The patient is critically ill with multiple organ systems failure and requires high complexity decision making for assessment and support, frequent evaluation and titration of therapies, application of advanced monitoring technologies and extensive interpretation of multiple databases.   Critical Care Time devoted to patient care services described in this note is  32  Minutes. This time reflects time of care of this signee Dr Jennet Maduro. This critical care time does not reflect procedure time, or teaching time or supervisory time of PA/NP/Med student/Med Resident etc but could involve care discussion time.  Rush Farmer, M.D. [facility] Pulmonary/Critical Care Medicine.

## 2019-04-28 NOTE — Procedures (Signed)
Patient Name: Troy Wells  MRN: [id number]  Epilepsy Attending: Lora Havens  Referring Physician/Provider: Dr Remi Haggard Aroor Date: 04/28/2019 Duration: 22.10 mins  Patient history: 53yo m who presented with PEA arrest. EEG to evaluate for seizure  Level of alertness: comatose/sedated  AEDs during EEG study: propofol  Technical aspects: This EEG study was done with scalp electrodes positioned according to the 10-20 International system of electrode placement. Electrical activity was acquired at a sampling rate of 500Hz  and reviewed with a high frequency filter of 70Hz  and a low frequency filter of 1Hz . EEG data were recorded continuously and digitally stored.   DESCRIPTION: EEG showed continuous generalized 3-6hz  theta-delta slowing. There were also intermittent periods of generalized background attenuation lasting 2-5 seconds. EEG was reactive to tactile suppression. Hyperventilation and photic stimulation were not performed.  ABNORMALITY - Continuous slow, generalized - Background attenuation, generalized  IMPRESSION: This study is suggestive of profound diffuse encephalopathy, non specifc to etiology, could be secondary to sedation.  No seizures or epileptiform discharges were seen throughout the recording.

## 2019-04-28 NOTE — Progress Notes (Signed)
LTM EEG hooked up and running Same leads used from spot EEG  - no initial skin breakdown - push button tested - neuro notified.

## 2019-04-28 NOTE — Progress Notes (Signed)
Did I-Stat ABG on patient.  PaO2 was 252 on ABG.  Weaned patient FIO2 down to 50% per ABG.  Will continue to monitor.

## 2019-04-29 ENCOUNTER — Encounter (HOSPITAL_COMMUNITY): Payer: Self-pay | Admitting: Radiology

## 2019-04-29 ENCOUNTER — Inpatient Hospital Stay (HOSPITAL_COMMUNITY): Payer: Medicare Other

## 2019-04-29 DIAGNOSIS — U071 COVID-19: Secondary | ICD-10-CM | POA: Diagnosis not present

## 2019-04-29 DIAGNOSIS — G931 Anoxic brain damage, not elsewhere classified: Secondary | ICD-10-CM

## 2019-04-29 DIAGNOSIS — I469 Cardiac arrest, cause unspecified: Secondary | ICD-10-CM | POA: Diagnosis not present

## 2019-04-29 DIAGNOSIS — N186 End stage renal disease: Secondary | ICD-10-CM | POA: Diagnosis not present

## 2019-04-29 LAB — MAGNESIUM: Magnesium: 2.2 mg/dL (ref 1.7–2.4)

## 2019-04-29 LAB — POCT I-STAT 7, (LYTES, BLD GAS, ICA,H+H)
Acid-Base Excess: 6 mmol/L — ABNORMAL HIGH (ref 0.0–2.0)
Bicarbonate: 30.9 mmol/L — ABNORMAL HIGH (ref 20.0–28.0)
Calcium, Ion: 1.01 mmol/L — ABNORMAL LOW (ref 1.15–1.40)
HCT: 28 % — ABNORMAL LOW (ref 39.0–52.0)
Hemoglobin: 9.5 g/dL — ABNORMAL LOW (ref 13.0–17.0)
O2 Saturation: 97 %
Patient temperature: 100.3
Potassium: 5.2 mmol/L — ABNORMAL HIGH (ref 3.5–5.1)
Sodium: 134 mmol/L — ABNORMAL LOW (ref 135–145)
TCO2: 32 mmol/L (ref 22–32)
pCO2 arterial: 48.9 mmHg — ABNORMAL HIGH (ref 32.0–48.0)
pH, Arterial: 7.412 (ref 7.350–7.450)
pO2, Arterial: 94 mmHg (ref 83.0–108.0)

## 2019-04-29 LAB — BASIC METABOLIC PANEL
Anion gap: 15 (ref 5–15)
BUN: 68 mg/dL — ABNORMAL HIGH (ref 6–20)
CO2: 26 mmol/L (ref 22–32)
Calcium: 7.9 mg/dL — ABNORMAL LOW (ref 8.9–10.3)
Chloride: 96 mmol/L — ABNORMAL LOW (ref 98–111)
Creatinine, Ser: 12.27 mg/dL — ABNORMAL HIGH (ref 0.61–1.24)
GFR calc Af Amer: 5 mL/min — ABNORMAL LOW (ref >60)
GFR calc non Af Amer: 4 mL/min — ABNORMAL LOW (ref >60)
Glucose, Bld: 158 mg/dL — ABNORMAL HIGH (ref 70–99)
Potassium: 5.3 mmol/L — ABNORMAL HIGH (ref 3.5–5.1)
Sodium: 137 mmol/L (ref 135–145)

## 2019-04-29 LAB — GLUCOSE, CAPILLARY
Glucose-Capillary: 152 mg/dL — ABNORMAL HIGH (ref 70–99)
Glucose-Capillary: 140 mg/dL — ABNORMAL HIGH (ref 70–99)
Glucose-Capillary: 134 mg/dL — ABNORMAL HIGH (ref 70–99)
Glucose-Capillary: 181 mg/dL — ABNORMAL HIGH (ref 70–99)
Glucose-Capillary: 132 mg/dL — ABNORMAL HIGH (ref 70–99)

## 2019-04-29 LAB — CBC
HCT: 26.8 % — ABNORMAL LOW (ref 39.0–52.0)
Hemoglobin: 8.9 g/dL — ABNORMAL LOW (ref 13.0–17.0)
MCH: 27.7 pg (ref 26.0–34.0)
MCHC: 33.2 g/dL (ref 30.0–36.0)
MCV: 83.5 fL (ref 80.0–100.0)
Platelets: 168 10*3/uL (ref 150–400)
RBC: 3.21 MIL/uL — ABNORMAL LOW (ref 4.22–5.81)
RDW: 17.2 % — ABNORMAL HIGH (ref 11.5–15.5)
WBC: 12.6 10*3/uL — ABNORMAL HIGH (ref 4.0–10.5)
nRBC: 0 % (ref 0.0–0.2)

## 2019-04-29 LAB — HEPARIN LEVEL (UNFRACTIONATED): Heparin Unfractionated: 0.71 IU/mL — ABNORMAL HIGH (ref 0.30–0.70)

## 2019-04-29 LAB — PHOSPHORUS: Phosphorus: 7.5 mg/dL — ABNORMAL HIGH (ref 2.5–4.6)

## 2019-04-29 MED ORDER — ASPIRIN 81 MG PO CHEW
81.0000 mg | CHEWABLE_TABLET | Freq: Every day | ORAL | Status: DC
  Administered 2019-04-29 – 2019-05-07 (×8): 81 mg
  Filled 2019-04-29 (×9): qty 1

## 2019-04-29 MED ORDER — ATORVASTATIN CALCIUM 40 MG PO TABS
80.0000 mg | ORAL_TABLET | Freq: Every day | ORAL | Status: DC
  Administered 2019-04-29: 14:00:00 80 mg via ORAL
  Filled 2019-04-29: qty 2

## 2019-04-29 MED ORDER — HEPARIN SODIUM (PORCINE) 1000 UNIT/ML IJ SOLN
INTRAMUSCULAR | Status: AC
  Filled 2019-04-29: qty 12

## 2019-04-29 MED ORDER — FAMOTIDINE 40 MG/5ML PO SUSR
10.0000 mg | Freq: Every day | ORAL | Status: DC
  Administered 2019-04-30 – 2019-05-05 (×7): 10.4 mg
  Filled 2019-04-29 (×8): qty 2.5

## 2019-04-29 MED ORDER — LEVETIRACETAM IN NACL 500 MG/100ML IV SOLN
500.0000 mg | INTRAVENOUS | Status: DC
  Administered 2019-04-29 – 2019-05-04 (×6): 500 mg via INTRAVENOUS
  Filled 2019-04-29 (×7): qty 100

## 2019-04-29 MED ORDER — LORAZEPAM 2 MG/ML IJ SOLN: 1.0000 mg | Freq: Once | INTRAMUSCULAR | Status: AC

## 2019-04-29 MED ORDER — ROCURONIUM BROMIDE 10 MG/ML (PF) SYRINGE
PREFILLED_SYRINGE | INTRAVENOUS | Status: AC
  Administered 2019-04-29: 80 mg
  Filled 2019-04-29: qty 10

## 2019-04-29 MED ORDER — PHENYLEPHRINE HCL-NACL 10-0.9 MG/250ML-% IV SOLN
0.0000 ug/min | INTRAVENOUS | Status: DC
  Administered 2019-04-29 – 2019-05-02 (×3): 20 ug/min via INTRAVENOUS
  Administered 2019-05-02: 40 ug/min via INTRAVENOUS
  Administered 2019-05-02: 50 ug/min via INTRAVENOUS
  Filled 2019-04-29 (×4): qty 250

## 2019-04-29 MED ORDER — LORAZEPAM 2 MG/ML IJ SOLN
1.0000 mg | Freq: Once | INTRAMUSCULAR | Status: AC
  Administered 2019-04-29: 1 mg via INTRAVENOUS

## 2019-04-29 MED ORDER — CHLORHEXIDINE GLUCONATE CLOTH 2 % EX PADS
6.0000 | MEDICATED_PAD | Freq: Every day | CUTANEOUS | Status: DC
  Administered 2019-04-29 – 2019-04-30 (×2): 6 via TOPICAL

## 2019-04-29 MED ORDER — LORAZEPAM 2 MG/ML IJ SOLN
INTRAMUSCULAR | Status: AC
  Administered 2019-04-29: 1 mg
  Filled 2019-04-29: qty 1

## 2019-04-29 MED ORDER — ROCURONIUM BROMIDE 50 MG/5ML IV SOLN
80.0000 mg | Freq: Once | INTRAVENOUS | Status: AC
  Filled 2019-04-29: qty 8

## 2019-04-29 MED ORDER — FAMOTIDINE 40 MG/5ML PO SUSR: 20.0000 mg | Freq: Every day | ORAL | Status: DC

## 2019-04-29 MED ORDER — ATORVASTATIN CALCIUM 40 MG PO TABS
80.0000 mg | ORAL_TABLET | Freq: Every day | ORAL | Status: DC
  Administered 2019-04-30 – 2019-05-10 (×11): 80 mg
  Filled 2019-04-29 (×11): qty 2

## 2019-04-29 MED ORDER — HEPARIN SODIUM (PORCINE) 1000 UNIT/ML IJ SOLN
INTRAMUSCULAR | Status: AC
  Filled 2019-04-29: qty 1

## 2019-04-29 NOTE — Progress Notes (Signed)
Assisted tele visit to patient with family member.  Child Campoy R, RN  

## 2019-04-29 NOTE — Progress Notes (Signed)
Assisted tele visit to patient with family member.  Ryann Pauli R, RN  

## 2019-04-29 NOTE — Progress Notes (Signed)
Interim Note  LTM EEG d/c overnight to obtain CT head, not hooked back up.  EEG today captured several non rhythmic twitching/jerking events: not myoclonus or seizures. ? Coughing/gagging on ET tube. Full report pending.  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  Plan Wean sedation as tolerated, consider Precedex Continue to treat underlying metabolic conditions and dialysis  LTM EEG just re-hooked up, will d/c tomorrow if no seizures and may consider d/c Keppra Patient had brainstem reflexes and withdrew to pain yesterday, early to make accurate neurological prognostication at this time as well exam maybe confounded with sedation and significantly elevated BUN   We will wait for the patient to come off sedation atleast 11MYTRZ and metabolic disturbances are corrected to repeat neurological exam for prognostication.

## 2019-04-29 NOTE — Progress Notes (Signed)
Attending:   04/27/2019  Subjective: ESRD and systolic heart failure baseline admitted on 11/10 after a 12 min PEA arrest at hemodialysis.  Based on history he noted some hypoxemia and dyspnea prior to the arrest.  Yesterday had severe abnormal motor movements of arms, legs, and grimacing EEG performed while on sedation, no seizure activity Had HD yesterday but this was held due to abnormal motor movements   Objective: Vitals:   04/29/19 0600 04/29/19 0630 04/29/19 0753 04/29/19 0944  BP: (!) 128/56 (!) 119/56 (!) 144/79   Pulse: 88 86 93 (!) 103  Resp: 10 11 14 16   Temp:    98.6 F (37 C)  TempSrc:    Oral  SpO2: 97% 99%  97%  Weight:      Height:       Vent Mode: CPAP;PSV FiO2 (%):  [40 %-50 %] 40 % Set Rate:  [20 bmp] 20 bmp Vt Set:  [400 mL] 400 mL PEEP:  [5 cmH20-8 cmH20] 5 cmH20 Pressure Support:  [5 cmH20] 5 cmH20 Plateau Pressure:  [19 cmH20-22 cmH20] 19 cmH20  Intake/Output Summary (Last 24 hours) at 04/29/2019 1030 Last data filed at 04/29/2019 0600 Gross per 24 hour  Intake 727.22 ml  Output 2234 ml  Net -1506.78 ml    General:  In bed on vent HENT: NCAT ETT in place PULM: CTA B, vent supported breathing CV: RRR, no mgr GI: BS+, soft, nontender MSK: normal bulk and tone Neuro: will open eyes to voice, doesn't follow commands, doesn't witdhraw to pain, will have severe head and upper body tremor with sternal rub    Repeat Echo 11/10 showed LVEF 40-45%, dilated LV, LA dilated, RA dilated, RV norma size and function, RA pressure estimate is 46mmHg  CBC    Component Value Date/Time   WBC 12.6 (H) 04/29/2019 0402   RBC 3.21 (L) 04/29/2019 0402   HGB 9.5 (L) 04/29/2019 0424   HCT 28.0 (L) 04/29/2019 0424   PLT 168 04/29/2019 0402   MCV 83.5 04/29/2019 0402   MCH 27.7 04/29/2019 0402   MCHC 33.2 04/29/2019 0402   RDW 17.2 (H) 04/29/2019 0402   LYMPHSABS 3.7 04/27/2019 1300   MONOABS 1.1 (H) 04/27/2019 1300   EOSABS 0.1 04/27/2019 1300   BASOSABS  0.0 04/27/2019 1300    BMET    Component Value Date/Time   NA 134 (L) 04/29/2019 0424   K 5.2 (H) 04/29/2019 0424   CL 96 (L) 04/29/2019 0402   CO2 26 04/29/2019 0402   GLUCOSE 158 (H) 04/29/2019 0402   BUN 68 (H) 04/29/2019 0402   CREATININE 12.27 (H) 04/29/2019 0402   CREATININE 9.06 (H) 08/31/2015 1041   CALCIUM 7.9 (L) 04/29/2019 0402   GFRNONAA 4 (L) 04/29/2019 0402   GFRNONAA 6 (L) 08/31/2015 1041   GFRAA 5 (L) 04/29/2019 0402   GFRAA 7 (L) 08/31/2015 1041    CXR images personally reviewed, bilateral small effusions, mild airspace disease, cardiomegaly, ETT in place  Impression/Plan: ARDS: improved oxygenation, continue ventilator on pressure support, but limiting factor for extubation is mental status ESRD: HD per renal Acute encephalopathy, worrisome for anoxic brain injury, with myoclonus yesterday we need to wean sedation while on EEG today Abnormal motor movements, myoclonus?: as above, continue keppra; prognostication MRI in next 2-3 days (ideally performed day 3-5 after injury) Low grade fever: monitor cultures, hold abx    My cc time 35 minutes  Roselie Awkward, MD [place] [facility] Pager: [phone] Cell: [phone] After 3pm or if  no response, call [phone]

## 2019-04-29 NOTE — Progress Notes (Signed)
RT transported pt to and from CT without event. 

## 2019-04-29 NOTE — Progress Notes (Signed)
Assisted tele visit to patient with family member.  Caitlinn Klinker R, RN  

## 2019-04-29 NOTE — Progress Notes (Signed)
Text/page to Dr. Leonel Ramsay to advise unable to transport pt to CT d/t active seizures.

## 2019-04-29 NOTE — Progress Notes (Signed)
Pt returned from CT without incident.  Received 1mg  of Ativan post CT for myoclonic activity per previous order.  Some EEG lead displacement noted & not resumed at this time per Dr. Katherine Roan.

## 2019-04-29 NOTE — Progress Notes (Signed)
NAME:  Gwendolyn Nishi, MRN:  [id number], DOB:  1966/05/10, LOS: 2 ADMISSION DATE:  04/27/2019, CONSULTATION DATE:  04/27/19 REFERRING MD:  Larrie Kass, MD CHIEF COMPLAINT:  PEA arrest  Brief History   53 y/o M male with ESRD and recent COVID-19 pneumonia admitted 11/10 after syncopal episode and found in PEA arrest.  Patient presented to dialysis center and on arrival complained of shortness of breath. He was found pale, diaphoretic and hypoxemic to 71% and bradycardic to 40s. During transport, he went into PEA arrest and received CPR for 13 minutes with epi x1, calcium and 1/2 amp sodium bicarb. King airway was placed in the field. On arrival to the ED, king airway was exchanged for ETT. PCCM consulted for admission.  Of note, patient had recent admission 10/27-11/3 for COVID-19 pneumonia and treated with oxygen, steroids, remdsivir and decadron. Hospital admission significant for NSTEMI and reduced EF. He was scheduled to follow-up with Cardiology to consider outpatient nuclear test. On discharge, he was able to maintain saturations on room air.   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  Any stimulation, he has abnormal motor movements.  Does not follow commands.  Tmax 100.4.  Normotensive.   HD 11/11 with 2.2L removed.   Objective   Blood pressure (!) 144/79, pulse 93, temperature 100.3 F (37.9 C), temperature source Bladder, resp. rate 14, height 5\' 10"  (1.778 m), weight 119.9 kg, SpO2 99 %.    Vent Mode: CPAP;PSV FiO2 (%):  [40 %-50 %] 40 % Set Rate:  [20 bmp] 20 bmp Vt Set:  [400 mL] 400 mL PEEP:  [5 cmH20-8 cmH20] 5 cmH20 Pressure Support:  [5 cmH20] 5 cmH20 Plateau Pressure:  [19 cmH20-22 cmH20] 19 cmH20   Intake/Output Summary (Last 24 hours) at 04/29/2019 0913 Last data filed at 04/29/2019 0600 Gross per 24 hour  Intake 761.13 ml  Output 2234 ml  Net -1472.87 ml   Filed Weights   04/28/19 0238 04/28/19 0800 04/29/19 0426  Weight: 118.4 kg 118.1 kg 119.9 kg   Physical Exam: Door observational exam, see attending physical General: chronically ill appearing male lying in bed in NAD on vent HEENT: MM pink/moist, ETT with thick brown secretions Neuro: opens eyes to voice, does not track, does not follow commands, abnormal jerking/motor movements, no withdrawal to painful stimuli, +cough with suction CV: SR on monitor   PULM:  Even/non-labored on vent, tolerating PSV 5/5, 40% GI: round, tolerating TF  Extremities: warm/dry, trace BLE edema  Skin: no rashes or lesions  Resolved Hospital Problem list     Assessment & Plan:   PEA Arrest  Preceded by syncope. Inciting event may have been hypoxemia related to volume overload however cannot rule out PE especially in setting of recent COVID-19 infection.  Did not undergo TTM.  Concern for anoxic  injury. 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   Possible Anoxic Injury  Myoclonus P: Appreciate Neurology evaluation  Minimize sedating medications as able to allow for neuro exam Continuous EEG in progress  MRI brain if no improvement in exam  Keppra per  Neurology  Correct metabolic / electrolyte disturbances  Hold home gabapentin   Acute hypoxemic respiratory failure Hx OSA on BiPAP P: PRVC 8cc/kg as rest mode Wean PEEP / FiO2 for sats 88-95% Daily SBT / WUA Follow intermittent ABG, CXR Volume removal per HD  VAP prevention measures  If dopplers negative, discontinue heparin gtt  Albuterol PRN   Rule Out Sepsis  Doubt acute infectious process  P: Hold abx  Follow cultures   ESRD on HD P: Appreciate Nephrology input  Trend BMP   Replace electrolytes as indicated  HTN, HLD, CAD, Prior Cardiac Arrest (2011) P: Hold home norvasc, coreg, minoxidil, demadex  Resume lipitor, ASA  DM II  P: Hold home glipizide, linagliptin SSI   Best practice:  Diet: NPO - TF per nutrition Pain/Anxiety/Delirium protocol (if indicated): Fentanyl gtt  VAP protocol (if indicated): Yes DVT prophylaxis: Heparin gtt GI prophylaxis: Pepcid Glucose control: SSI Mobility: Bed rest Code Status: Full Family Communication: Wife Maudie Mercury) [phone] updated via phone 11/12.   Disposition: ICU  Labs   CBC: Recent Labs  Lab 04/27/19 1300  04/27/19 1659 04/28/19 0425 04/28/19 0622 04/28/19 0951 04/29/19 0402 04/29/19 0424  WBC 15.8*  --  15.9*  --  13.2*  --  12.6*  --   NEUTROABS 10.3*  --   --   --   --   --   --   --   HGB 9.9*   < > 9.7* 8.8* 9.1* 10.5* 8.9* 9.5*  HCT 31.8*   < > 28.9* 26.0* 26.7* 31.0* 26.8* 28.0*  MCV 88.3  --  83.8  --  81.9  --  83.5  --   PLT 266  --  219  --  215  --  168  --    < > = values in this interval not displayed.   Basic Metabolic Panel: Recent Labs  Lab 04/27/19 1300 04/27/19 1310  04/27/19 1659 04/28/19 0425 04/28/19 0622 04/28/19 0951 04/29/19 0402 04/29/19 0424  NA 138 136   < >  --  136 139 138 137 134*  K 5.6* 5.5*   < >  --  6.0* 6.1* 4.1 5.3* 5.2*  CL 96* 100  --   --   --  94*  --  96*  --   CO2 22  --   --   --   --  26  --  26  --   GLUCOSE 237* 238*  --   --   --  112*  --   158*  --   BUN 94* 101*  --   --   --  108*  --  68*  --   CREATININE 14.70* 13.60*  --   --   --  16.39*  --  12.27*  --   CALCIUM 8.3*  --   --   --   --  7.9*  --  7.9*  --   MG 2.8*  --   --  2.5*  --  2.4  --  2.2  --   PHOS 7.9*  --   --   --   --   --   --  7.5*  --    < > = values in this interval not displayed.   GFR: Estimated Creatinine Clearance: 8.9 mL/min (A) (by C-G formula based on SCr of 12.27 mg/dL (H)). Recent Labs  Lab 04/27/19 1300 04/27/19 1307 04/27/19 1659 04/27/19 1842 04/28/19 0622 04/29/19 0402  PROCALCITON  --   --  1.30  --   --   --   WBC 15.8*  --  15.9*  --  13.2* 12.6*  LATICACIDVEN  --  8.7* 1.7 2.3*  --   --     Liver Function Tests: Recent Labs  Lab 04/27/19 1300  AST 59*  ALT 120*  ALKPHOS 74  BILITOT 1.1  PROT 7.1  ALBUMIN 3.6   No results for input(s): LIPASE, AMYLASE in the last 168 hours. Recent Labs  Lab 04/27/19 1659  AMMONIA 50*    ABG    Component Value Date/Time   PHART 7.412 04/29/2019 0424   PCO2ART 48.9 (H) 04/29/2019 0424   PO2ART 94.0 04/29/2019 0424   HCO3 30.9 (H) 04/29/2019 0424   TCO2 32 04/29/2019 0424   O2SAT 97.0 04/29/2019 0424     Coagulation Profile: Recent Labs  Lab 04/27/19 1419  INR 1.1    Cardiac Enzymes: No results for input(s): CKTOTAL, CKMB, CKMBINDEX, TROPONINI in the last 168 hours.  HbA1C: Hemoglobin-A1c  Date/Time Value Ref Range Status  09/14/2009 09:43 AM 8.5 (H) 5.4 - 7.4 % Final   Hemoglobin A1C  Date/Time Value Ref Range Status  11/21/2016 02:33 PM 5.3  Final  04/02/2016 10:05 AM 6.1  Final   HbA1c, POC (controlled diabetic range)  Date/Time Value Ref Range Status  10/21/2018 11:25 AM 5.7 0.0 - 7.0 % Final   Hgb A1c MFr Bld  Date/Time Value Ref Range Status  04/14/2019 02:00 AM 5.8 (H) 4.8 - 5.6 % Final    Comment:    (NOTE) Pre diabetes:          5.7%-6.4% Diabetes:              >6.4% Glycemic control for   <7.0% adults with diabetes     CBG: Recent  Labs  Lab 04/28/19 1109 04/28/19 1521 04/28/19 2007 04/28/19 2317 04/29/19 0459  GLUCAP 135* 127* 110* 120* 152*     Noe Gens, NP-C [facility] 04/29/2019, 9:13 AM

## 2019-04-29 NOTE — Procedures (Addendum)
Patient Name: Troy Wells  MRN: [id number]  Epilepsy Attending: Lora Havens  Referring Physician/Provider: Dr Remi Haggard Aroor Duration: 04/28/2019 1422 to 04/29/2019 0037. 04/29/2019 1047 to 04/30/2019 0047  Patient history: 53yo m who presented with PEA arrest. EEG to evaluate for seizure  Level of alertness: comatose/sedated  AEDs during EEG study: propofol  Technical aspects: This EEG study was done with scalp electrodes positioned according to the 10-20 International system of electrode placement. Electrical activity was acquired at a sampling rate of 500Hz  and reviewed with a high frequency filter of 70Hz  and a low frequency filter of 1Hz . EEG data were recorded continuously and digitally stored.   DESCRIPTION: EEG showed continuous generalized 3-6hz  theta-delta slowing. There were also intermittent periods of generalized background attenuation lasting 2-5 seconds. EEG was reactive to tactile suppression. Hyperventilation and photic stimulation were not performed.  Patient event button was pressed on 04/29/2019 at 1149.  RN suctioned the patient after which patient was noted to have whole-body nonrhythmic twitching lasting a few seconds.  Concomitant EEG before during and after the event did not show any EEG change to suggest seizure.   Event button was pressed again on 04/29/2019 at 1330. Patient was noted to have non rhythmic whole body twitching lasting a few minutes. Concomitant EEG before during and after the event did not show any EEG change to suggest seizure.    ABNORMALITY - Continuous slow, generalized - Background attenuation, generalized  IMPRESSION: This study is suggestive of profound diffuse encephalopathy, non specifc to etiology, could be secondary to sedation.  No seizures or epileptiform discharges were seen throughout the recording.  One event was captured on 04/29/2019 at 1149 and 1330 as described above without EEG change.  This was a nonepileptic  event.

## 2019-04-29 NOTE — Progress Notes (Signed)
Assisted tele visit to patient with daughter.  Troy Heims Samson, RN  

## 2019-04-29 NOTE — Progress Notes (Signed)
[facility] Progress Note  Subjective: no new issues. CXR much better, FiO2 down, but having signs of possible ABI per CCM notes.   Vitals:   04/29/19 0900 04/29/19 0930 04/29/19 0944 04/29/19 1000  BP: 135/72 (!) 141/67  (!) 145/78  Pulse: (!) 101 96 (!) 103 (!) 101  Resp: '15 15 16 18  ' Temp:   98.6 F (37 C)   TempSrc:   Oral   SpO2: 98% 97% 97% 96%  Weight:      Height:        Inpatient medications: . aspirin  81 mg Per Tube Daily  . atorvastatin  80 mg Oral Daily  . chlorhexidine gluconate (MEDLINE KIT)  15 mL Mouth Rinse BID  . Chlorhexidine Gluconate Cloth  6 each Topical Q0600  . [START ON 04/30/2019] famotidine  10.4 mg Per Tube Q0600  . feeding supplement (PRO-STAT SUGAR FREE 64)  30 mL Per Tube QID  . heparin  9,000 Units Dialysis Once in dialysis  . insulin aspart  2-6 Units Subcutaneous Q4H  . mouth rinse  15 mL Mouth Rinse 10 times per day   . feeding supplement (VITAL 1.5 CAL) 1,000 mL (04/28/19 1924)  . fentaNYL infusion INTRAVENOUS Stopped (04/29/19 1040)  . heparin 1,600 Units/hr (04/29/19 1100)  . levETIRAcetam    . propofol (DIPRIVAN) infusion Stopped (04/28/19 1159)   acetaminophen, albuterol, fentaNYL, hydrALAZINE, midazolam    Exam: Patient not examined today directly given COVID-19 + status, utilizing exam of the primary team and observations of RN's.  Per CCM MD pt w/o peripheral edema, has frothy materials coming up in ETT however.       Home meds:  - amlodipine 10/ carvedilol 25 bid/ minoxidil 20 bid  - linagliptin 5/ glipizide 5   - gabapentin 300 hs  - aspirin 81/ atrovastatin 80  - allopurinol 100 bid  - auryxia / prn's/ vitamins/ supplements     Outpt HD: TTS     4.5h  F250  124.5kg  400/800  2/2.25  Hep 12,000  TDC  - mircera 30 q4 last 10/13  - hect 4     Assessment/ Plan: 1. Resp failure/ diffuse pulm infiltrates - recent COVID pna dc'd 7d ago. Severe admit CXR changes resolved overnight before we  dialyzed him yesterday morning, ?aspirated at time of arrest. FiO2 down to 40%.  2. ESRD on HD TTS. HD today again in ICU , get back on sched 3. HTN/ volume - 4kg under dry, some UE edema only, CXR mostly cleared, UF as tol today w HD.  4. SP PEA arrest - en route 5. DM2 - per primary 6. Recent COVID pna - dc'd on 11/3 7. Anemia ckd - Hb 9's ,give darbe here 60 ug w/ HD today then q thurs   Rob Raini Tiley 04/29/2019, 11:28 AM  Iron/TIBC/Ferritin/ %Sat No results found for: IRON, TIBC, FERRITIN, IRONPCTSAT Recent Labs  Lab 04/27/19 1300  04/27/19 1419  04/29/19 0402 04/29/19 0424  NA 138   < >  --    < > 137 134*  K 5.6*   < >  --    < > 5.3* 5.2*  CL 96*   < >  --    < > 96*  --   CO2 22  --   --    < > 26  --   GLUCOSE 237*   < >  --    < > 158*  --   BUN 94*   < >  --    < >  68*  --   CREATININE 14.70*   < >  --    < > 12.27*  --   CALCIUM 8.3*  --   --    < > 7.9*  --   PHOS 7.9*  --   --   --  7.5*  --   ALBUMIN 3.6  --   --   --   --   --   INR  --   --  1.1  --   --   --    < > = values in this interval not displayed.   Recent Labs  Lab 04/27/19 1300  AST 59*  ALT 120*  ALKPHOS 74  BILITOT 1.1  PROT 7.1   Recent Labs  Lab 04/29/19 0402 04/29/19 0424  WBC 12.6*  --   HGB 8.9* 9.5*  HCT 26.8* 28.0*  PLT 168  --

## 2019-04-29 NOTE — Progress Notes (Signed)
Patient event button pressed on 04/29/2019 at 1149.  RN suctioned the patient after which patient was noted to have whole-body nonrhythmic twitching lasting a few seconds.  Concomitant EEG before during and after the event did not show any EEG change to suggest seizure.  This was a nonepileptic event.  Please review final EEG report for further details.   Leilanni Halvorson Barbra Sarks

## 2019-04-29 NOTE — Progress Notes (Signed)
Re hooked pt with new EEG electrodes. Resumed study. Notified Neuro

## 2019-04-29 NOTE — Progress Notes (Signed)
ANTICOAGULATION CONSULT NOTE   Pharmacy Consult for Heparin Indication: pulmonary embolus  No Known Allergies  Patient Measurements: Height: 5\' 10"  (177.8 cm) Weight: 264 lb 5.3 oz (119.9 kg) IBW/kg (Calculated) : 73 Heparin Dosing Weight: 98.4kg   Vital Signs: Temp: 100.3 F (37.9 C) (11/12 0300) Temp Source: Bladder (11/12 0300) BP: 139/75 (11/12 0500) Pulse Rate: 93 (11/12 0500)  Labs: Recent Labs    04/27/19 1300 04/27/19 1310 04/27/19 1419  04/27/19 1659 04/27/19 2240  04/28/19 0622 04/28/19 0951 04/29/19 0402 04/29/19 0424  HGB 9.9* 11.2*  --    < > 9.7*  --    < > 9.1* 10.5* 8.9* 9.5*  HCT 31.8* 33.0*  --    < > 28.9*  --    < > 26.7* 31.0* 26.8* 28.0*  PLT 266  --   --   --  219  --   --  215  --  168  --   APTT  --   --   --   --  155*  --   --   --   --   --   --   LABPROT  --   --  13.9  --   --   --   --   --   --   --   --   INR  --   --  1.1  --   --   --   --   --   --   --   --   HEPARINUNFRC  --   --   --   --   --  0.54  --  0.68  --  0.71*  --   CREATININE 14.70* 13.60*  --   --   --   --   --  16.39*  --   --   --   TROPONINIHS 98*  --   --   --  143* 229*  --   --   --   --   --    < > = values in this interval not displayed.    Estimated Creatinine Clearance: 6.7 mL/min (A) (by C-G formula based on SCr of 16.39 mg/dL (H)).   Medical History: Past Medical History:  Diagnosis Date  . CARDIAC ARREST 11/01/2009   Qualifier: Diagnosis of  By: Selena Batten CMA, Jewel    . Colon polyps 01/02/2016   Colonoscopy July 2017 - One 3 mm polyp in the transverse colon, removed with a cold biopsy forceps. Resected and retrieved. - One 3 mm polyp in the rectum, removed with a cold biopsy forceps. Resected and retrieved. - Diverticulosis in the entire examined colon. - Non-bleeding internal hemorrhoids. - The examination was otherwise normal. - Significant looping which prolonged cecal    . Coronary artery disease 08/15/2009   with AMI  . Dialysis  patient [facility]) 09-27-2014   mon, wed, and fri  . Diverticulosis of colon without hemorrhage 01/02/2016   Colonoscopy July 2017 - One 3 mm polyp in the transverse colon, removed with a cold biopsy forceps. Resected and retrieved. - One 3 mm polyp in the rectum, removed with a cold biopsy forceps. Resected and retrieved. - Diverticulosis in the entire examined colon. - Non-bleeding internal hemorrhoids. - The examination was otherwise normal. - Significant looping which prolonged cecal    . DM (diabetes mellitus), type 2 with renal complications ([place]) 01/27/7516  . Essential hypertension 11/01/2009   Qualifier: Diagnosis of  By: Selena Batten CMA, Jewel    .  Hyperlipidemia   . Hypertension   . Mitral valve regurgitation 09/19/2015   Echo 09/2015   . OSA treated with BiPAP 06/17/2010  . Sleep apnea    cpap nightly      Assessment: 53 yo male presented on 04/27/2019 post CPR. Prior to presentation, patient was at dialysis when he become SOB, diaphoretic and pale when EMS arrived the patient was unresponsive and during transport patient went into PEA. Chest x-ray on 04/27/2019 found diffuse severe bilateral pulmonary infiltrates. PE rule out pending doppler this AM.  Hg, PLT OK. No reported bleeding.   HL currently slightly supratherapeutic at 0.71 on 1650U/hr  Goal of Therapy:  Heparin level 0.3-0.7 units/ml Monitor platelets by anticoagulation protocol: Yes   Plan:  Decrease heparin to 1600U/hr. Check HL with AM labs. Monitor Hg, PLT, signs and symptoms of bleeding.   Alanda Slim, PharmD, [facility] Clinical Pharmacist Please see AMION for all Pharmacists' Contact Phone Numbers 04/29/2019, 5:30 AM

## 2019-04-30 DIAGNOSIS — G9340 Encephalopathy, unspecified: Secondary | ICD-10-CM | POA: Diagnosis not present

## 2019-04-30 DIAGNOSIS — J80 Acute respiratory distress syndrome: Secondary | ICD-10-CM

## 2019-04-30 DIAGNOSIS — N186 End stage renal disease: Secondary | ICD-10-CM | POA: Diagnosis not present

## 2019-04-30 DIAGNOSIS — I469 Cardiac arrest, cause unspecified: Secondary | ICD-10-CM | POA: Diagnosis not present

## 2019-04-30 DIAGNOSIS — U071 COVID-19: Secondary | ICD-10-CM | POA: Diagnosis not present

## 2019-04-30 LAB — RENAL FUNCTION PANEL
Albumin: 2.7 g/dL — ABNORMAL LOW (ref 3.5–5.0)
Anion gap: 14 (ref 5–15)
BUN: 59 mg/dL — ABNORMAL HIGH (ref 6–20)
CO2: 27 mmol/L (ref 22–32)
Calcium: 8.1 mg/dL — ABNORMAL LOW (ref 8.9–10.3)
Chloride: 97 mmol/L — ABNORMAL LOW (ref 98–111)
Creatinine, Ser: 9.99 mg/dL — ABNORMAL HIGH (ref 0.61–1.24)
GFR calc Af Amer: 6 mL/min — ABNORMAL LOW (ref >60)
GFR calc non Af Amer: 5 mL/min — ABNORMAL LOW (ref >60)
Glucose, Bld: 139 mg/dL — ABNORMAL HIGH (ref 70–99)
Phosphorus: 6.9 mg/dL — ABNORMAL HIGH (ref 2.5–4.6)
Potassium: 5.1 mmol/L (ref 3.5–5.1)
Sodium: 138 mmol/L (ref 135–145)

## 2019-04-30 LAB — GLUCOSE, CAPILLARY
Glucose-Capillary: 129 mg/dL — ABNORMAL HIGH (ref 70–99)
Glucose-Capillary: 129 mg/dL — ABNORMAL HIGH (ref 70–99)
Glucose-Capillary: 144 mg/dL — ABNORMAL HIGH (ref 70–99)
Glucose-Capillary: 152 mg/dL — ABNORMAL HIGH (ref 70–99)
Glucose-Capillary: 154 mg/dL — ABNORMAL HIGH (ref 70–99)
Glucose-Capillary: 160 mg/dL — ABNORMAL HIGH (ref 70–99)
Glucose-Capillary: 166 mg/dL — ABNORMAL HIGH (ref 70–99)

## 2019-04-30 LAB — CBC
HCT: 25.1 % — ABNORMAL LOW (ref 39.0–52.0)
Hemoglobin: 8.4 g/dL — ABNORMAL LOW (ref 13.0–17.0)
MCH: 27.6 pg (ref 26.0–34.0)
MCHC: 33.5 g/dL (ref 30.0–36.0)
MCV: 82.6 fL (ref 80.0–100.0)
Platelets: 142 10*3/uL — ABNORMAL LOW (ref 150–400)
RBC: 3.04 MIL/uL — ABNORMAL LOW (ref 4.22–5.81)
RDW: 17.4 % — ABNORMAL HIGH (ref 11.5–15.5)
WBC: 11 10*3/uL — ABNORMAL HIGH (ref 4.0–10.5)
nRBC: 0 % (ref 0.0–0.2)

## 2019-04-30 LAB — HEPARIN LEVEL (UNFRACTIONATED): Heparin Unfractionated: 0.68 IU/mL (ref 0.30–0.70)

## 2019-04-30 MED ORDER — POLYETHYLENE GLYCOL 3350 17 G PO PACK
17.0000 g | PACK | Freq: Every day | ORAL | Status: DC
  Administered 2019-04-30 – 2019-05-04 (×5): 17 g
  Filled 2019-04-30 (×5): qty 1

## 2019-04-30 MED ORDER — CHLORHEXIDINE GLUCONATE CLOTH 2 % EX PADS
6.0000 | MEDICATED_PAD | Freq: Every day | CUTANEOUS | Status: DC
  Administered 2019-05-01 – 2019-05-02 (×2): 6 via TOPICAL

## 2019-04-30 MED ORDER — AMLODIPINE BESYLATE 10 MG PO TABS: 10.0000 mg | ORAL_TABLET | Freq: Every day | ORAL | Status: DC

## 2019-04-30 MED ORDER — DOCUSATE SODIUM 50 MG/5ML PO LIQD
100.0000 mg | Freq: Two times a day (BID) | ORAL | Status: DC
  Administered 2019-04-30 – 2019-05-09 (×18): 100 mg
  Filled 2019-04-30 (×18): qty 10

## 2019-04-30 MED ORDER — HEPARIN SODIUM (PORCINE) 10000 UNIT/ML IJ SOLN
10000.0000 [IU] | Freq: Three times a day (TID) | INTRAMUSCULAR | Status: DC
  Administered 2019-04-30 – 2019-05-05 (×15): 10000 [IU] via SUBCUTANEOUS
  Filled 2019-04-30 (×18): qty 1

## 2019-04-30 NOTE — Procedures (Signed)
Patient Name:Troy Wells [facility] Attending:Anicia Leuthold Barbra Sarks Referring Physician/Provider:Dr LIDCVUD Aroor Duration:  04/29/2019 0047 to 04/30/2019 1020  Patient THYHOOI:74ZV m who presented with PEA arrest. EEG to evaluate for seizure  Level of alertness:comatose/sedated  AEDs during EEG study: keppra, versed  Technical aspects: This EEG study was done with scalp electrodes positioned according to the 10-20 International system of electrode placement. Electrical activity was acquired at a sampling rate of 500Hz  and reviewed with a high frequency filter of 70Hz  and a low frequency filter of 1Hz . EEG data were recorded continuously and digitally stored.  DESCRIPTION: EEG showed continuous generalized 3-6hz  theta-delta slowing. EEG was reactive to tactile suppression.Hyperventilation and photic stimulation were not performed.  ABNORMALITY - Continuous slow, generalized  IMPRESSION: This study issuggestive of profound diffuse encephalopathy, non specifc to etiology, could be secondary to sedation.No seizures or epileptiform discharges were seen throughout the recording.

## 2019-04-30 NOTE — Progress Notes (Signed)
RT note: patient does not meet SBT criteria this AM due to FIO2 of 60% and PEEP of 12.  Tolerating current ventilator settings well.  Will continue to monitor.

## 2019-04-30 NOTE — Progress Notes (Signed)
Assisted tele visit to patient with daughter.  Laural Eiland P, RN  

## 2019-04-30 NOTE — Progress Notes (Signed)
[facility] Progress Note  Subjective: data taken from chart  Vitals:   04/30/19 1400 04/30/19 1430 04/30/19 1500 04/30/19 1505  BP: (!) 150/81 (!) 151/71 (!) 124/95 (!) 124/95  Pulse: 88 86 88 87  Resp: (!) '29 20 20 20  ' Temp:      TempSrc:      SpO2: 100% 100% 100% 100%  Weight:      Height:        Inpatient medications: . aspirin  81 mg Per Tube Daily  . atorvastatin  80 mg Per Tube Daily  . chlorhexidine gluconate (MEDLINE KIT)  15 mL Mouth Rinse BID  . Chlorhexidine Gluconate Cloth  6 each Topical Q0600  . docusate  100 mg Per Tube BID  . famotidine  10.4 mg Per Tube Q0600  . feeding supplement (PRO-STAT SUGAR FREE 64)  30 mL Per Tube QID  . heparin injection (subcutaneous)  10,000 Units Subcutaneous Q8H  . insulin aspart  2-6 Units Subcutaneous Q4H  . mouth rinse  15 mL Mouth Rinse 10 times per day  . polyethylene glycol  17 g Per Tube Daily   . feeding supplement (VITAL 1.5 CAL) 1,000 mL (04/30/19 1521)  . fentaNYL infusion INTRAVENOUS 100 mcg/hr (04/30/19 1500)  . levETIRAcetam Stopped (04/29/19 2212)  . phenylephrine (NEO-SYNEPHRINE) Adult infusion Stopped (04/29/19 1948)   acetaminophen, albuterol, fentaNYL, hydrALAZINE, midazolam    Exam: Patient not examined today directly given COVID-19 + status, utilizing exam of the primary team and observations of RN's.  Per CCM MD pt w/o peripheral edema, has frothy materials coming up in ETT however.       Home meds:  - amlodipine 10/ carvedilol 25 bid/ minoxidil 20 bid  - linagliptin 5/ glipizide 5   - gabapentin 300 hs  - aspirin 81/ atrovastatin 80  - allopurinol 100 bid  - auryxia / prn's/ vitamins/ supplements     Outpt HD: TTS     4.5h  F250  124.5kg  400/800  2/2.25  Hep 12,000  TDC  - mircera 30 q4 last 10/13  - hect 4     Assessment/ Plan: 1. Resp failure/COVID+ / pulm infiltrates - recent COVID pna dc'd 7d ago. CXR's better.  2. ESRD on HD TTS. HD Saturday.  3. HTN/  volume - 4kg under dry, +UE edema only, doubt vol overloaded 4. SP PEA arrest 5. DM2 - per primary 6. Recent COVID pna - dc'd on 11/3 7. Anemia ckd - give darbe here 60 ug w/ HD today then q thurs   Rob Abdulrahim Siddiqi 04/30/2019, 3:54 PM  Iron/TIBC/Ferritin/ %Sat No results found for: IRON, TIBC, FERRITIN, IRONPCTSAT Recent Labs  Lab 04/27/19 1419  04/30/19 0443  NA  --    < > 138  K  --    < > 5.1  CL  --    < > 97*  CO2  --    < > 27  GLUCOSE  --    < > 139*  BUN  --    < > 59*  CREATININE  --    < > 9.99*  CALCIUM  --    < > 8.1*  PHOS  --    < > 6.9*  ALBUMIN  --   --  2.7*  INR 1.1  --   --    < > = values in this interval not displayed.   Recent Labs  Lab 04/27/19 1300  AST 59*  ALT 120*  ALKPHOS 74  BILITOT 1.1  PROT 7.1   Recent Labs  Lab 04/30/19 0443  WBC 11.0*  HGB 8.4*  HCT 25.1*  PLT 142*

## 2019-04-30 NOTE — Progress Notes (Addendum)
NAME:  Troy Wells, MRN:  [id number], DOB:  [birth year], LOS: 3 ADMISSION DATE:  04/27/2019, CONSULTATION DATE:  04/27/19 REFERRING MD:  Larrie Kass, MD CHIEF COMPLAINT:  PEA arrest  Brief History   53 y/o M male with ESRD and recent COVID-19 pneumonia admitted 11/10 after syncopal episode and found in PEA arrest.  Patient presented to dialysis center and on arrival complained of shortness of breath. He was found pale, diaphoretic and hypoxemic to 71% and bradycardic to 40s. During transport, he went into PEA arrest and received CPR for 13 minutes with epi x1, calcium and 1/2 amp sodium bicarb. King airway was placed in the field. On arrival to the ED, king airway was exchanged for ETT. PCCM consulted for admission.  Of note, patient had recent admission 10/27-11/3 for COVID-19 pneumonia and treated with oxygen, steroids, remdsivir and decadron. Hospital admission significant for NSTEMI and reduced EF. He was scheduled to follow-up with Cardiology to consider outpatient nuclear test. On discharge, he was able to maintain saturations on room air.   Past Medical History  ESRD on HD Chronic anemia Chronic systolic heart failure CAD HTN OSA on CPAP DM2 HLD  Significant Hospital Events   11/10 Admitted 11/11 Myoclonic activity, HD with 2.2L removed 11/12 Fentanyl 100 mcg's.  Any stimulation, he has abnormal motor movements, no follow commands.   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 No acute events overnight. On fentanyl 100 mcg's. Tmax 99.8.     Objective   Blood pressure (!) 154/71, pulse 90, temperature 98.4 F (36.9 C), temperature source Axillary, resp. rate 20, height 5\' 10"  (1.778 m), weight 120.1 kg, SpO2 100 %.    Vent Mode: PRVC FiO2 (%):  [60 %-80 %] 60 % Set Rate:  [20 bmp] 20 bmp Vt Set:  [400 mL] 400 mL PEEP:  [10 YBO17-51 cmH20] 12 cmH20 Plateau Pressure:  [11 cmH20-21 cmH20] 21 cmH20   Intake/Output Summary (Last 24 hours) at 04/30/2019 0846 Last data filed at 04/30/2019 0800 Gross per 24 hour  Intake 1425.57 ml  Output 1500 ml  Net -74.43 ml   Filed Weights   04/29/19 2000 04/29/19 2343 04/30/19 0423  Weight: 119.9 kg 118.5 kg 120.1 kg   Physical Exam: General: critically ill appearing adult male lying in bed on vent in NAD   HEENT: MM pink/moist, ETT, pupils 63mm reactive, anicteric, lids / lashes normal, no nasal discharge Neuro: opens eyes to voice, looks at provider, intermittently moves foot to command, withdraws from pain BLE CV: s1s2 rrr, no m/r/g, radial pulses +2 symmetrical PULM:  Even/non-labored on vent, vent assisted breaths, no wheezing  GI: protuberant, soft, bsx4 active Extremities: warm/dry, no edema, LUE AVF +thrill/bruit  Skin: no rashes or lesions  Resolved Hospital Problem list     Assessment & Plan:   PEA Arrest  Preceded by syncope. Inciting event may have been hypoxemia related to volume overload.  LE doppler negative for DVT and  ECHO does not support PE.  Did not undergo TTM.  Concern for anoxic injury. CT head with old right cerebellar infarct, no acute findings.  P: ICU monitoring  Stop heparin gtt, transition to DVT prophylaxis / COVID dosing  Avoid hypothermia Minimize sedation Follow frequent neuro exam  Possible Anoxic Injury  Myoclonus EEG 11/12 with suspected non-epileptic event P: Appreciate Neurology evaluation  Minimize  sedating medications as able to allow for neuro exam Keppra per Neurology  Hold home gabapentin  Acute hypoxemic respiratory failure Hx OSA on BiPAP P: PRVC 6cc/kg as rest mode Wean PEEP / FiO2 for sats 88-95% Daily WUA / SBT if meets criteria  Follow intermittent ABG, CXR Volume status per CVVHD VAP prevention measures Albuterol PRN   Rule Out Sepsis  Doubt acute infectious process  Recent COVID Infection P: Follow cultures Monitor off abx, tmax 100, no leukocytosis   ESRD on HD P: Appreciate Nephrology evaluation  Follow BMP  Monitor, replace electrolytes as indicated   HTN, HLD, CAD, Prior Cardiac Arrest (2011) P: Continue ASA, lipitor Hold home coreg, norvasc, minoxidil, demadex   DM II  P: SSI, sensitive scale Hold home glipizide, linagliptin  Constipation P: Add bowel regimen 11/13  Best practice:  Diet: NPO - TF per nutrition Pain/Anxiety/Delirium protocol (if indicated): Fentanyl gtt  VAP protocol (if indicated): Yes DVT prophylaxis: Heparin gtt GI prophylaxis: Pepcid Glucose control: SSI Mobility: Bed rest Code Status: Full Family Communication: Wife Maudie Mercury) [phone] updated via phone 11/12.   Disposition: ICU  Labs   CBC: Recent Labs  Lab 04/27/19 1300  04/27/19 1659  04/28/19 0622 04/28/19 0951 04/29/19 0402 04/29/19 0424 04/30/19 0443  WBC 15.8*  --  15.9*  --  13.2*  --  12.6*  --  11.0*  NEUTROABS 10.3*  --   --   --   --   --   --   --   --   HGB 9.9*   < > 9.7*   < > 9.1* 10.5* 8.9* 9.5* 8.4*  HCT 31.8*   < > 28.9*   < > 26.7* 31.0* 26.8* 28.0* 25.1*  MCV 88.3  --  83.8  --  81.9  --  83.5  --  82.6  PLT 266  --  219  --  215  --  168  --  142*   < > = values in this interval not displayed.   Basic Metabolic Panel: Recent Labs  Lab 04/27/19 1300 04/27/19 1310  04/27/19 1659  04/28/19 0622 04/28/19 0951 04/29/19 0402 04/29/19 0424 04/30/19 0443  NA 138 136   < >  --    < > 139 138 137 134* 138  K 5.6* 5.5*   < >  --     < > 6.1* 4.1 5.3* 5.2* 5.1  CL 96* 100  --   --   --  94*  --  96*  --  97*  CO2 22  --   --   --   --  26  --  26  --  27  GLUCOSE 237* 238*  --   --   --  112*  --  158*  --  139*  BUN 94* 101*  --   --   --  108*  --  68*  --  59*  CREATININE 14.70* 13.60*  --   --   --  16.39*  --  12.27*  --  9.99*  CALCIUM 8.3*  --   --   --   --  7.9*  --  7.9*  --  8.1*  MG 2.8*  --   --  2.5*  --  2.4  --  2.2  --   --   PHOS 7.9*  --   --   --   --   --   --  7.5*  --  6.9*   < > = values in this interval not displayed.   GFR: Estimated Creatinine Clearance: 11 mL/min (A) (by C-G formula based on SCr of 9.99 mg/dL (H)). Recent Labs  Lab 04/27/19 1307 04/27/19 1659 04/27/19 1842 04/28/19 0622 04/29/19 0402 04/30/19 0443  PROCALCITON  --  1.30  --   --   --   --   WBC  --  15.9*  --  13.2* 12.6* 11.0*  LATICACIDVEN 8.7* 1.7 2.3*  --   --   --     Liver Function Tests: Recent Labs  Lab 04/27/19 1300 04/30/19 0443  AST 59*  --   ALT 120*  --   ALKPHOS 74  --   BILITOT 1.1  --   PROT 7.1  --   ALBUMIN 3.6 2.7*   No results for input(s): LIPASE, AMYLASE in the last 168 hours. Recent Labs  Lab 04/27/19 1659  AMMONIA 50*    ABG    Component Value Date/Time   PHART 7.412 04/29/2019 0424   PCO2ART 48.9 (H) 04/29/2019 0424   PO2ART 94.0 04/29/2019 0424   HCO3 30.9 (H) 04/29/2019 0424   TCO2 32 04/29/2019 0424   O2SAT 97.0 04/29/2019 0424     Coagulation Profile: Recent Labs  Lab 04/27/19 1419  INR 1.1    Cardiac Enzymes: No results for input(s): CKTOTAL, CKMB, CKMBINDEX, TROPONINI in the last 168 hours.  HbA1C: Hemoglobin-A1c  Date/Time Value Ref Range Status  09/14/2009 09:43 AM 8.5 (H) 5.4 - 7.4 % Final   Hemoglobin A1C  Date/Time Value Ref Range Status  11/21/2016 02:33 PM 5.3  Final  04/02/2016 10:05 AM 6.1  Final   HbA1c, POC (controlled diabetic range)  Date/Time Value Ref Range Status  10/21/2018 11:25 AM 5.7 0.0 - 7.0 % Final   Hgb A1c MFr Bld   Date/Time Value Ref Range Status  04/14/2019 02:00 AM 5.8 (H) 4.8 - 5.6 % Final    Comment:    (NOTE) Pre diabetes:          5.7%-6.4% Diabetes:              >6.4% Glycemic control for   <7.0% adults with diabetes     CBG: Recent Labs  Lab 04/29/19 1810 04/29/19 2109 04/30/19 0028 04/30/19 0357 04/30/19 0733  GLUCAP 181* 132* 160* 166* 154*    CC Time: 35 minutes   Noe Gens, NP-C [facility] 04/30/2019, 8:46 AM

## 2019-04-30 NOTE — Progress Notes (Signed)
RT called to patient room due to patient ventilator alarm alarming for regulation pressure limited.  Upon arrival to room, patient was not biting on ETT, peak pressures were reading in the high 40s and double clutching on ventilator.  Lavaged patient and RT noted a moderate amount of thick, bloody secretions from patient's ETT.  Peak pressures are now down to low 30s.  Will continue to monitor.

## 2019-05-01 ENCOUNTER — Other Ambulatory Visit: Payer: Self-pay

## 2019-05-01 ENCOUNTER — Inpatient Hospital Stay (HOSPITAL_COMMUNITY): Payer: Medicare Other

## 2019-05-01 DIAGNOSIS — J1289 Other viral pneumonia: Secondary | ICD-10-CM

## 2019-05-01 DIAGNOSIS — I469 Cardiac arrest, cause unspecified: Secondary | ICD-10-CM | POA: Diagnosis not present

## 2019-05-01 DIAGNOSIS — U071 COVID-19: Secondary | ICD-10-CM

## 2019-05-01 DIAGNOSIS — N186 End stage renal disease: Secondary | ICD-10-CM | POA: Diagnosis not present

## 2019-05-01 DIAGNOSIS — Z8709 Personal history of other diseases of the respiratory system: Secondary | ICD-10-CM

## 2019-05-01 DIAGNOSIS — J9601 Acute respiratory failure with hypoxia: Secondary | ICD-10-CM | POA: Diagnosis not present

## 2019-05-01 DIAGNOSIS — G931 Anoxic brain damage, not elsewhere classified: Secondary | ICD-10-CM | POA: Diagnosis not present

## 2019-05-01 DIAGNOSIS — J80 Acute respiratory distress syndrome: Secondary | ICD-10-CM | POA: Diagnosis not present

## 2019-05-01 LAB — CBC
HCT: 23.5 % — ABNORMAL LOW (ref 39.0–52.0)
Hemoglobin: 7.7 g/dL — ABNORMAL LOW (ref 13.0–17.0)
MCH: 27.2 pg (ref 26.0–34.0)
MCHC: 32.8 g/dL (ref 30.0–36.0)
MCV: 83 fL (ref 80.0–100.0)
Platelets: 131 10*3/uL — ABNORMAL LOW (ref 150–400)
RBC: 2.83 MIL/uL — ABNORMAL LOW (ref 4.22–5.81)
RDW: 16.9 % — ABNORMAL HIGH (ref 11.5–15.5)
WBC: 8.9 10*3/uL (ref 4.0–10.5)
nRBC: 0 % (ref 0.0–0.2)

## 2019-05-01 LAB — POCT I-STAT 7, (LYTES, BLD GAS, ICA,H+H)
Acid-base deficit: 9 mmol/L — ABNORMAL HIGH (ref 0.0–2.0)
Bicarbonate: 22.1 mmol/L (ref 20.0–28.0)
Calcium, Ion: 1.15 mmol/L (ref 1.15–1.40)
HCT: 29 % — ABNORMAL LOW (ref 39.0–52.0)
Hemoglobin: 9.9 g/dL — ABNORMAL LOW (ref 13.0–17.0)
O2 Saturation: 54 %
Patient temperature: 98.8
Potassium: 6.1 mmol/L — ABNORMAL HIGH (ref 3.5–5.1)
Sodium: 134 mmol/L — ABNORMAL LOW (ref 135–145)
TCO2: 24 mmol/L (ref 22–32)
pCO2 arterial: 78 mmHg (ref 32.0–48.0)
pH, Arterial: 7.061 — CL (ref 7.350–7.450)
pO2, Arterial: 42 mmHg — ABNORMAL LOW (ref 83.0–108.0)

## 2019-05-01 LAB — BASIC METABOLIC PANEL
Anion gap: 16 — ABNORMAL HIGH (ref 5–15)
BUN: 90 mg/dL — ABNORMAL HIGH (ref 6–20)
CO2: 25 mmol/L (ref 22–32)
Calcium: 8.2 mg/dL — ABNORMAL LOW (ref 8.9–10.3)
Chloride: 95 mmol/L — ABNORMAL LOW (ref 98–111)
Creatinine, Ser: 11.43 mg/dL — ABNORMAL HIGH (ref 0.61–1.24)
GFR calc Af Amer: 5 mL/min — ABNORMAL LOW (ref >60)
GFR calc non Af Amer: 4 mL/min — ABNORMAL LOW (ref >60)
Glucose, Bld: 134 mg/dL — ABNORMAL HIGH (ref 70–99)
Potassium: 5.9 mmol/L — ABNORMAL HIGH (ref 3.5–5.1)
Sodium: 136 mmol/L (ref 135–145)

## 2019-05-01 LAB — GLUCOSE, CAPILLARY
Glucose-Capillary: 150 mg/dL — ABNORMAL HIGH (ref 70–99)
Glucose-Capillary: 173 mg/dL — ABNORMAL HIGH (ref 70–99)
Glucose-Capillary: 121 mg/dL — ABNORMAL HIGH (ref 70–99)
Glucose-Capillary: 177 mg/dL — ABNORMAL HIGH (ref 70–99)
Glucose-Capillary: 110 mg/dL — ABNORMAL HIGH (ref 70–99)

## 2019-05-01 MED ORDER — MIDAZOLAM BOLUS VIA INFUSION
1.0000 mg | INTRAVENOUS | Status: DC | PRN
  Filled 2019-05-01: qty 2

## 2019-05-01 MED ORDER — FENTANYL 2500MCG IN NS 250ML (10MCG/ML) PREMIX INFUSION
50.0000 ug/h | INTRAVENOUS | Status: DC
  Administered 2019-05-01 – 2019-05-02 (×3): 200 ug/h via INTRAVENOUS
  Administered 2019-05-03: 100 ug/h via INTRAVENOUS
  Administered 2019-05-03 – 2019-05-04 (×2): 200 ug/h via INTRAVENOUS
  Administered 2019-05-05: 250 ug/h via INTRAVENOUS
  Filled 2019-05-01 (×6): qty 250

## 2019-05-01 MED ORDER — MIDAZOLAM 50MG/50ML (1MG/ML) PREMIX INFUSION
2.0000 mg/h | INTRAVENOUS | Status: DC
  Administered 2019-05-01: 2 mg/h via INTRAVENOUS
  Administered 2019-05-01: 6 mg/h via INTRAVENOUS
  Administered 2019-05-01: 8 mg/h via INTRAVENOUS
  Administered 2019-05-02: 6 mg/h via INTRAVENOUS
  Administered 2019-05-02: 10 mg/h via INTRAVENOUS
  Administered 2019-05-02: 8 mg/h via INTRAVENOUS
  Administered 2019-05-02 – 2019-05-03 (×3): 10 mg/h via INTRAVENOUS
  Administered 2019-05-05: 3 mg/h via INTRAVENOUS
  Administered 2019-05-05: 7 mg/h via INTRAVENOUS
  Administered 2019-05-05: 6 mg/h via INTRAVENOUS
  Administered 2019-05-05: 3 mg/h via INTRAVENOUS
  Administered 2019-05-06 (×2): 10 mg/h via INTRAVENOUS
  Administered 2019-05-06: 7 mg/h via INTRAVENOUS
  Administered 2019-05-06 – 2019-05-07 (×3): 10 mg/h via INTRAVENOUS
  Administered 2019-05-07: 8 mg/h via INTRAVENOUS
  Filled 2019-05-01 (×20): qty 50

## 2019-05-01 MED ORDER — FENTANYL CITRATE (PF) 100 MCG/2ML IJ SOLN
50.0000 ug | Freq: Once | INTRAMUSCULAR | Status: AC
  Administered 2019-05-01: 50 ug via INTRAVENOUS
  Filled 2019-05-01: qty 2

## 2019-05-01 MED ORDER — FENTANYL CITRATE (PF) 100 MCG/2ML IJ SOLN
25.0000 ug | INTRAMUSCULAR | Status: DC | PRN
  Administered 2019-05-01 (×2): 100 ug via INTRAVENOUS
  Filled 2019-05-01 (×2): qty 2

## 2019-05-01 MED ORDER — HEPARIN SODIUM (PORCINE) 1000 UNIT/ML DIALYSIS
6000.0000 [IU] | INTRAMUSCULAR | Status: DC | PRN
  Filled 2019-05-01: qty 6

## 2019-05-01 MED ORDER — FENTANYL BOLUS VIA INFUSION
50.0000 ug | INTRAVENOUS | Status: DC | PRN
  Administered 2019-05-03 – 2019-05-05 (×9): 50 ug via INTRAVENOUS
  Filled 2019-05-01: qty 50

## 2019-05-01 MED ORDER — HEPARIN SODIUM (PORCINE) 1000 UNIT/ML DIALYSIS: 6000.0000 [IU] | INTRAMUSCULAR | Status: DC | PRN

## 2019-05-01 MED ORDER — CISATRACURIUM BESYLATE (PF) 10 MG/5ML IV SOLN
0.1000 mg/kg | INTRAVENOUS | Status: DC | PRN
  Administered 2019-05-01 – 2019-05-02 (×2): 12 mg via INTRAVENOUS
  Filled 2019-05-01 (×6): qty 6

## 2019-05-01 MED ORDER — ROCURONIUM BROMIDE 10 MG/ML (PF) SYRINGE
PREFILLED_SYRINGE | INTRAVENOUS | Status: AC
  Administered 2019-05-01: 100 mg
  Filled 2019-05-01: qty 10

## 2019-05-01 MED ORDER — ARTIFICIAL TEARS OPHTHALMIC OINT
1.0000 "application " | TOPICAL_OINTMENT | Freq: Three times a day (TID) | OPHTHALMIC | Status: DC
  Administered 2019-05-01 – 2019-05-07 (×15): 1 via OPHTHALMIC
  Filled 2019-05-01 (×2): qty 3.5

## 2019-05-01 NOTE — Progress Notes (Signed)
[facility] PCCM  Called emergently to bedside because the patient's O2 saturation dropped in the midst of agitation. On exam, minimal air movement, audible cuff leak, thick secretions. Emergent bronchoscopy performed showing thick frothy secretions throughout both lungs. CVL placed because he lost his IV  CXR shows severe bilateral airspace disease, ETT in place, CVL in place  Cuff leak fixed with air in tube  O2 saturation still low, high 70's, low 80's  Impression: Worsening ARDS vs flash edema, favor the latter COVID 19  Anoxic encephalopathy ESRD  Plan: Increase PEEP Paralyze Sedate with fentanyl, versed for now for vent synchrony> intermittent paralytic protocol May  Emergent hemodialysis> have called renal May need to prone, but would like to remove fluid with dialysis first Still need MRI brain, would like to get this after hemodialysis  I updated his daughter Marijo Conception by phone   Additional cc time by me 40 minutes  Roselie Awkward, MD [facility] Pager: [phone] Cell: [phone] If no response, call [phone]

## 2019-05-01 NOTE — Procedures (Signed)
PCCM Video Bronchoscopy Procedure Note  The patient was informed of the risks (including but not limited to bleeding, infection, respiratory failure, lung injury, tooth/oral injury) and benefits of the procedure and gave consent, see chart.  Indication: Severe acute respiratory failure with hypoxemia  Post Procedure Diagnosis: ARDS COVID 19  Location: [facility], 56M MICU  Condition pre procedure: critically ill, on vent  Medications for procedure: fentanyl infusion, versed bolus  Procedure description: The bronchoscope was introduced through the endotracheal tube and passed to the bilateral lungs to the level of the subsegmental bronchi throughout the tracheobronchial tree.  Airway exam revealed bloody frothy secretions throughout the airways bilaterally.  No mucus plugs, no masses, ETT in good position.  Procedures performed: none  Specimens sent: none  Condition post procedure: critically ill, on vent  EBL: none from procedure  Complications: none  Roselie Awkward, MD [facility] Pager: [phone] Cell: [phone] If no response, call [phone]

## 2019-05-01 NOTE — Progress Notes (Signed)
Asked by Network engineer to place family video call. Upon camera assessment into patient's room, Staff initiating CRRT. Asked not to make camera call at this time.

## 2019-05-01 NOTE — Progress Notes (Signed)
ABG drawn per Dr Lake Bells. Critical results given to Dr Lake Bells at [phone].  PH- 7.061 PCO2- 78 PO2- 42 HCO3- 22.1  Ashley Mariner RRT

## 2019-05-01 NOTE — Progress Notes (Signed)
[facility] Progress Note  Subjective: data taken from chart and d/w dr Lake Bells. BP's high. Wt's edging up to 120kg.   Vitals:   05/01/19 1100 05/01/19 1159 05/01/19 1200 05/01/19 1350  BP: (!) 173/87  (!) 148/112   Pulse: (!) 101  95   Resp: (!) 22  20   Temp:  98.8 F (37.1 C)    TempSrc:      SpO2: 100%  100% 98%  Weight:      Height:        Inpatient medications: . rocuronium bromide      . aspirin  81 mg Per Tube Daily  . atorvastatin  80 mg Per Tube Daily  . chlorhexidine gluconate (MEDLINE KIT)  15 mL Mouth Rinse BID  . Chlorhexidine Gluconate Cloth  6 each Topical Q0600  . docusate  100 mg Per Tube BID  . famotidine  10.4 mg Per Tube Q0600  . feeding supplement (PRO-STAT SUGAR FREE 64)  30 mL Per Tube QID  . heparin injection (subcutaneous)  10,000 Units Subcutaneous Q8H  . insulin aspart  2-6 Units Subcutaneous Q4H  . mouth rinse  15 mL Mouth Rinse 10 times per day  . polyethylene glycol  17 g Per Tube Daily   . feeding supplement (VITAL 1.5 CAL) 1,000 mL (05/01/19 0600)  . levETIRAcetam Stopped (04/30/19 2057)  . phenylephrine (NEO-SYNEPHRINE) Adult infusion Stopped (04/29/19 1948)   acetaminophen, albuterol, fentaNYL (SUBLIMAZE) injection, hydrALAZINE, midazolam    Exam: Patient not examined today directly given COVID-19 + status, utilizing exam of the primary team and observations of RN's.     Home meds:  - amlodipine 10/ carvedilol 25 bid/ minoxidil 20 bid  - linagliptin 5/ glipizide 5   - gabapentin 300 hs  - aspirin 81/ atrovastatin 80  - allopurinol 100 bid  - auryxia / prn's/ vitamins/ supplements     Outpt HD: TTS     4.5h  F250  124.5kg  400/800  2/2.25  Hep 12,000  TDC  - mircera 30 q4 last 10/13  - hect 4   CXR 11/14 - no edema   Assessment/ Plan: 1. Resp failure/COVID+ / pulm infiltrates - recent COVID pna dc'd 7d ago. CXR much better.  2. ESRD on HD TTS. HD today, UF 1-2 L as tolerated. Under dry wt but  2kg up from admit wts.  3. HTN/ volume - no gross overload, under dry wt but BP's high 4. SP PEA arrest 5. DM2 - per primary 6. Recent COVID pna - dc'd on 11/3 7. Anemia ckd - darbe here 60 ug w/ HD q thurs   Rob Vivika Poythress 05/01/2019, 2:55 PM  Iron/TIBC/Ferritin/ %Sat No results found for: IRON, TIBC, FERRITIN, IRONPCTSAT Recent Labs  Lab 04/27/19 1419  04/30/19 0443 05/01/19 0221  NA  --    < > 138 136  K  --    < > 5.1 5.9*  CL  --    < > 97* 95*  CO2  --    < > 27 25  GLUCOSE  --    < > 139* 134*  BUN  --    < > 59* 90*  CREATININE  --    < > 9.99* 11.43*  CALCIUM  --    < > 8.1* 8.2*  PHOS  --    < > 6.9*  --   ALBUMIN  --   --  2.7*  --   INR 1.1  --   --   --    < > =  values in this interval not displayed.   Recent Labs  Lab 04/27/19 1300  AST 59*  ALT 120*  ALKPHOS 74  BILITOT 1.1  PROT 7.1   Recent Labs  Lab 05/01/19 0221  WBC 8.9  HGB 7.7*  HCT 23.5*  PLT 131*

## 2019-05-01 NOTE — Procedures (Signed)
Central Venous Catheter Insertion Procedure Note Troy Wells [id number] 1965/09/18  Procedure: Insertion of Central Venous Catheter Indications: Assessment of intravascular volume and Drug and/or fluid administration  Procedure Details Consent: Unable to obtain consent because of emergent medical necessity. Time Out: Verified patient identification, verified procedure, site/side was marked, verified correct patient position, special equipment/implants available, medications/allergies/relevent history reviewed, required imaging and test results available.  Performed  Maximum sterile technique was used including antiseptics, cap, gloves, gown, hand hygiene, mask and sheet. Skin prep: Chlorhexidine; local anesthetic administered A antimicrobial bonded/coated triple lumen catheter was placed in the right internal jugular vein using the Seldinger technique.  Ultrasound was used to verify the patency of the vein and for real time needle guidance.  Evaluation Blood flow good Complications: No apparent complications Patient did tolerate procedure well. Chest X-ray ordered to verify placement.  CXR: pending.  Troy Wells 05/01/2019, 3:46 PM

## 2019-05-01 NOTE — Progress Notes (Signed)
eLink Physician-Brief Progress Note Patient Name: Troy Wells DOB: April 29, 1966 MRN: [id number]   Date of Service  05/01/2019  HPI/Events of Note  Hypotension while on HD. Is getting HD for fluid removal. Has an order in for phenylephrine, HR is 116  eICU Interventions  Start phenylephrine per orders, spoke with RN     Intervention Category Major Interventions: Respiratory failure - evaluation and management;Hypotension - evaluation and management  Margaretmary Lombard 05/01/2019, 11:27 PM

## 2019-05-01 NOTE — Progress Notes (Signed)
Around 1445, pt agitated and swinging arms around, MD paged and pt given 2mg  versed. MD at bedside ordered 2mg  versed more for a total of 4mg . Bloody secretions coming out of ETT suction, and pt heartrate in 140's and spO2 40's. MD did a bronchoscopy and placed a central line. Pt spO2 70's slowly increasing. Heart rate went down to 120's.   MD adjusted vent settings, and put in new orders for sedation.   MD updated pt's daughter.   Will continue to monitor.

## 2019-05-01 NOTE — Progress Notes (Signed)
NAME:  Troy Wells, MRN:  [id number], DOB:  [birth year], LOS: 4 ADMISSION DATE:  04/27/2019, CONSULTATION DATE:  11/10 REFERRING MD:  Johnney Killian, CHIEF COMPLAINT:  Cardiac arrest   Brief History   53 y/o male with ESRD and COVID 19 had a cardiac arrest at dialysis, 12 minutes.   Past Medical History  ESRD Chronic systolic heart failure CAD HTN OSA DM2 HLD  Significant Hospital Events   11/10 Admitted 11/11 Myoclonic activity, HD with 2.2L removed 11/12 Fentanyl 100 mcg's.  Any stimulation, he has abnormal motor movements, no follow commands.   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 rate 20, height 5\' 10"  (1.778 m), weight 120.2 kg, SpO2 100 %.    Vent Mode: PRVC FiO2 (%):  [40 %-50 %] 40 % Set Rate:  [20 bmp] 20 bmp Vt Set:  [430 mL] 430 mL PEEP:  [8 YSA63-01 cmH20] 8 cmH20 Plateau Pressure:  [18 cmH20-24 cmH20] 18 cmH20   Intake/Output Summary (Last 24 hours) at 05/01/2019 0912 Last data filed at 05/01/2019 0600 Gross per 24 hour  Intake 1632.45 ml  Output -  Net 1632.45 ml   Filed Weights   04/29/19 2343 04/30/19 0423 05/01/19 0335  Weight: 118.5 kg  120.1 kg 120.2 kg    Examination: General:  In bed on vent HENT: NCAT ETT in place PULM: CTA B, vent supported breathing CV: RRR, no mgr GI: BS+, soft, nontender MSK: normal bulk and tone Neuro: opens eyes to voice, no movement of arms, legs, no response to pain   Resolved Hospital Problem list     Assessment & Plan:  Severe acute respiratory failure with hypoxemia, recent COVID 19 pneumonia: improving oxygenation Continue mechanical ventilation per ARDS protocol Target TVol 6-8cc/kgIBW Target Plateau Pressure < 30cm H20 Target driving pressure less than 15 cm of water Target PaO2 55-65: titrate PEEP/FiO2 per protocol As long as PaO2 to FiO2 ratio is less than 1:150 position in prone position for 16 hours a day Check CVP daily if CVL in place Target CVP less than 4, diurese as necessary Ventilator associated pneumonia prevention protocol 11/14 plan: wean PEEP/FiO2 per protocol  ESRD:  HD per renal Monitor BMET and UOP Replace electrolytes as needed  Need for sedation for mechanical ventilation: minimal RASS target 0 to -1 Change fentanyl infusion to prn  PEA arrest on admission> due to respiratory failure  Concern for anoxic brain injury: prognosis seems poor MRI brain today Continue keppra  CAD Tele Holding home b-blocker, norvasc, minoxidil, demadex  DM2 SSI  Constipation miralax  Best practice:  Diet: tube feeding per protocol Pain/Anxiety/Delirium protocol (if indicated): as above VAP protocol (if indicated): yes DVT prophylaxis: sub q heparin per protocol GI prophylaxis: famotidine  Glucose  control: SSI Mobility: bed rest Code Status: Full Family Communication: I updated his wife and daughter by phone today Disposition: remain in ICU  Labs   CBC: Recent Labs  Lab 04/27/19 1300  04/27/19 1659  04/28/19 0622 04/28/19 0951 04/29/19 0402 04/29/19 0424 04/30/19 0443 05/01/19 0221  WBC 15.8*  --  15.9*  --  13.2*  --  12.6*  --  11.0* 8.9   NEUTROABS 10.3*  --   --   --   --   --   --   --   --   --   HGB 9.9*   < > 9.7*   < > 9.1* 10.5* 8.9* 9.5* 8.4* 7.7*  HCT 31.8*   < > 28.9*   < > 26.7* 31.0* 26.8* 28.0* 25.1* 23.5*  MCV 88.3  --  83.8  --  81.9  --  83.5  --  82.6 83.0  PLT 266  --  219  --  215  --  168  --  142* 131*   < > = values in this interval not displayed.    Basic Metabolic Panel: Recent Labs  Lab 04/27/19 1300 04/27/19 1310  04/27/19 1659  04/28/19 0622 04/28/19 0951 04/29/19 0402 04/29/19 0424 04/30/19 0443 05/01/19 0221  NA 138 136   < >  --    < > 139 138 137 134* 138 136  K 5.6* 5.5*   < >  --    < > 6.1* 4.1 5.3* 5.2* 5.1 5.9*  CL 96* 100  --   --   --  94*  --  96*  --  97* 95*  CO2 22  --   --   --   --  26  --  26  --  27 25  GLUCOSE 237* 238*  --   --   --  112*  --  158*  --  139* 134*  BUN 94* 101*  --   --   --  108*  --  68*  --  59* 90*  CREATININE 14.70* 13.60*  --   --   --  16.39*  --  12.27*  --  9.99* 11.43*  CALCIUM 8.3*  --   --   --   --  7.9*  --  7.9*  --  8.1* 8.2*  MG 2.8*  --   --  2.5*  --  2.4  --  2.2  --   --   --   PHOS 7.9*  --   --   --   --   --   --  7.5*  --  6.9*  --    < > = values in this interval not displayed.   GFR: Estimated Creatinine Clearance: 9.6 mL/min (A) (by C-G formula based on SCr of 11.43 mg/dL (H)). Recent Labs  Lab 04/27/19 1307 04/27/19 1659 04/27/19 1842 04/28/19 0622 04/29/19 0402 04/30/19 0443 05/01/19 0221  PROCALCITON  --  1.30  --   --   --   --   --   WBC  --  15.9*  --  13.2* 12.6* 11.0* 8.9  LATICACIDVEN 8.7* 1.7 2.3*  --   --   --   --     Liver Function Tests: Recent Labs  Lab 04/27/19 1300 04/30/19 0443  AST 59*  --   ALT 120*  --   ALKPHOS 74  --   BILITOT 1.1  --   PROT 7.1  --  ALBUMIN 3.6 2.7*   No results for input(s): LIPASE, AMYLASE in the last 168 hours. Recent Labs  Lab 04/27/19 1659  AMMONIA 50*    ABG    Component Value Date/Time   PHART 7.412 04/29/2019 0424   PCO2ART 48.9 (H)  04/29/2019 0424   PO2ART 94.0 04/29/2019 0424   HCO3 30.9 (H) 04/29/2019 0424   TCO2 32 04/29/2019 0424   O2SAT 97.0 04/29/2019 0424     Coagulation Profile: Recent Labs  Lab 04/27/19 1419  INR 1.1    Cardiac Enzymes: No results for input(s): CKTOTAL, CKMB, CKMBINDEX, TROPONINI in the last 168 hours.  HbA1C: Hemoglobin-A1c  Date/Time Value Ref Range Status  09/14/2009 09:43 AM 8.5 (H) 5.4 - 7.4 % Final   Hemoglobin A1C  Date/Time Value Ref Range Status  11/21/2016 02:33 PM 5.3  Final  04/02/2016 10:05 AM 6.1  Final   HbA1c, POC (controlled diabetic range)  Date/Time Value Ref Range Status  10/21/2018 11:25 AM 5.7 0.0 - 7.0 % Final   Hgb A1c MFr Bld  Date/Time Value Ref Range Status  04/14/2019 02:00 AM 5.8 (H) 4.8 - 5.6 % Final    Comment:    (NOTE) Pre diabetes:          5.7%-6.4% Diabetes:              >6.4% Glycemic control for   <7.0% adults with diabetes     CBG: Recent Labs  Lab 04/30/19 1527 04/30/19 1945 04/30/19 2343 05/01/19 0332 05/01/19 0800  GLUCAP 129* 129* 144* 150* 173*     Critical care time: 45 minutes      Roselie Awkward, MD [place] [facility] Pager: [phone] Cell: [phone] If no response, call [phone]

## 2019-05-02 ENCOUNTER — Inpatient Hospital Stay (HOSPITAL_COMMUNITY): Payer: Medicare Other

## 2019-05-02 DIAGNOSIS — J80 Acute respiratory distress syndrome: Secondary | ICD-10-CM | POA: Diagnosis not present

## 2019-05-02 DIAGNOSIS — I469 Cardiac arrest, cause unspecified: Secondary | ICD-10-CM | POA: Diagnosis not present

## 2019-05-02 DIAGNOSIS — J9601 Acute respiratory failure with hypoxia: Secondary | ICD-10-CM | POA: Diagnosis not present

## 2019-05-02 DIAGNOSIS — U071 COVID-19: Secondary | ICD-10-CM | POA: Diagnosis not present

## 2019-05-02 LAB — CBC WITH DIFFERENTIAL/PLATELET
Abs Immature Granulocytes: 0.04 10*3/uL (ref 0.00–0.07)
Basophils Absolute: 0 10*3/uL (ref 0.0–0.1)
Basophils Relative: 0 %
Eosinophils Absolute: 0.1 10*3/uL (ref 0.0–0.5)
Eosinophils Relative: 1 %
HCT: 20.1 % — ABNORMAL LOW (ref 39.0–52.0)
Hemoglobin: 6.8 g/dL — CL (ref 13.0–17.0)
Immature Granulocytes: 1 %
Lymphocytes Relative: 12 %
Lymphs Abs: 1 10*3/uL (ref 0.7–4.0)
MCH: 27.8 pg (ref 26.0–34.0)
MCHC: 33.8 g/dL (ref 30.0–36.0)
MCV: 82 fL (ref 80.0–100.0)
Monocytes Absolute: 0.8 10*3/uL (ref 0.1–1.0)
Monocytes Relative: 10 %
Neutro Abs: 5.9 10*3/uL (ref 1.7–7.7)
Neutrophils Relative %: 76 %
Platelets: 131 10*3/uL — ABNORMAL LOW (ref 150–400)
RBC: 2.45 MIL/uL — ABNORMAL LOW (ref 4.22–5.81)
RDW: 17.2 % — ABNORMAL HIGH (ref 11.5–15.5)
WBC: 7.8 10*3/uL (ref 4.0–10.5)
nRBC: 0.4 % — ABNORMAL HIGH (ref 0.0–0.2)

## 2019-05-02 LAB — BASIC METABOLIC PANEL
Anion gap: 15 (ref 5–15)
BUN: 61 mg/dL — ABNORMAL HIGH (ref 6–20)
CO2: 28 mmol/L (ref 22–32)
Calcium: 8.5 mg/dL — ABNORMAL LOW (ref 8.9–10.3)
Chloride: 97 mmol/L — ABNORMAL LOW (ref 98–111)
Creatinine, Ser: 8.37 mg/dL — ABNORMAL HIGH (ref 0.61–1.24)
GFR calc Af Amer: 8 mL/min — ABNORMAL LOW (ref >60)
GFR calc non Af Amer: 7 mL/min — ABNORMAL LOW (ref >60)
Glucose, Bld: 146 mg/dL — ABNORMAL HIGH (ref 70–99)
Potassium: 5.3 mmol/L — ABNORMAL HIGH (ref 3.5–5.1)
Sodium: 140 mmol/L (ref 135–145)

## 2019-05-02 LAB — GLUCOSE, CAPILLARY
Glucose-Capillary: 116 mg/dL — ABNORMAL HIGH (ref 70–99)
Glucose-Capillary: 140 mg/dL — ABNORMAL HIGH (ref 70–99)
Glucose-Capillary: 144 mg/dL — ABNORMAL HIGH (ref 70–99)
Glucose-Capillary: 149 mg/dL — ABNORMAL HIGH (ref 70–99)
Glucose-Capillary: 151 mg/dL — ABNORMAL HIGH (ref 70–99)
Glucose-Capillary: 90 mg/dL (ref 70–99)

## 2019-05-02 LAB — CULTURE, BLOOD (ROUTINE X 2)
Culture: NO GROWTH
Culture: NO GROWTH
Special Requests: ADEQUATE
Special Requests: ADEQUATE

## 2019-05-02 MED ORDER — CHLORHEXIDINE GLUCONATE CLOTH 2 % EX PADS
6.0000 | MEDICATED_PAD | Freq: Every day | CUTANEOUS | Status: DC
  Administered 2019-05-02 – 2019-05-05 (×3): 6 via TOPICAL

## 2019-05-02 NOTE — Progress Notes (Signed)
Patient was transported from MRI with no apparent complications.

## 2019-05-02 NOTE — Progress Notes (Signed)
Pt scheduled for MRI at 5:45. Pt began to desat so vent settings were adjusted by RT. Pt maxed out on sedation and spO2 began to come up. Brought pt down to MRI and he began fighting the ventilator, Nimbex was given, but didn't appear to help. MRI scan started and completed without further incident. Pt brought back up to 48m11.

## 2019-05-02 NOTE — Progress Notes (Signed)
[facility] Progress Note  Subjective: data taken from chart and HD nurse overnight. Had HD last night, BP drops into low 90's, started on neo gtt 20-40 ug/min, got off 2.5 L which was the goal.  CVP 14. CXR much better today.   Vitals:   05/02/19 0300 05/02/19 0400 05/02/19 0500 05/02/19 0600  BP: (!) 104/55 (!) 91/55 (!) 87/54 (!) 99/58  Pulse: (!) 107 (!) 105 (!) 105 (!) 101  Resp: (!) 30 (!) 30 (!) 30 (!) 30  Temp:  99.5 F (37.5 C)    TempSrc:  Axillary    SpO2: 100% 100% 100% 100%  Weight:      Height:        Inpatient medications: . artificial tears  1 application Both Eyes X4G  . aspirin  81 mg Per Tube Daily  . atorvastatin  80 mg Per Tube Daily  . chlorhexidine gluconate (MEDLINE KIT)  15 mL Mouth Rinse BID  . Chlorhexidine Gluconate Cloth  6 each Topical Q0600  . docusate  100 mg Per Tube BID  . famotidine  10.4 mg Per Tube Q0600  . feeding supplement (PRO-STAT SUGAR FREE 64)  30 mL Per Tube QID  . heparin injection (subcutaneous)  10,000 Units Subcutaneous Q8H  . insulin aspart  2-6 Units Subcutaneous Q4H  . mouth rinse  15 mL Mouth Rinse 10 times per day  . polyethylene glycol  17 g Per Tube Daily   . feeding supplement (VITAL 1.5 CAL) 1,000 mL (05/02/19 0533)  . fentaNYL infusion INTRAVENOUS 200 mcg/hr (05/02/19 0600)  . levETIRAcetam Stopped (05/01/19 2355)  . midazolam 8 mg/hr (05/02/19 0600)  . phenylephrine (NEO-SYNEPHRINE) Adult infusion 40 mcg/min (05/02/19 0600)   acetaminophen, albuterol, cisatracurium (NIMBEX) injection, fentaNYL, fentaNYL (SUBLIMAZE) injection, heparin, heparin, hydrALAZINE, midazolam    Exam: Patient not examined today directly given COVID-19 + status, utilizing exam of the primary team and observations of RN's.     Home meds:  - amlodipine 10/ carvedilol 25 bid/ minoxidil 20 bid  - linagliptin 5/ glipizide 5   - gabapentin 300 hs  - aspirin 81/ atrovastatin 80  - allopurinol 100 bid  - auryxia / prn's/  vitamins/ supplements     Outpt HD: TTS     4.5h  F250  124.5kg  400/800  2/2.25  Hep 12,000  TDC  - mircera 30 q4 last 10/13  - hect 4   CXR 11/14 - no edema   Assessment/ Plan: 1. Resp failure/COVID+ / pulm infiltrates - recent COVID pna dc'd 7d ago. Resp distress / hypoxemia Sat w/ new pulm infiltrates, suspected flash pulm edema. Got HD overnight 2.5 L off w/ pressor support and CXR better today. Plan extra HD tomorrow, lower vol more as tol 2. ESRD on HD TTS. As above 3. HTN/ volume - as above, HD tomorrow may need more volume off. CVP 14- 18 4. SP PEA arrest 5. DM2 - per primary 6. Recent COVID pna - dc'd on 11/3 7. Anemia ckd - darbe here 60 ug w/ HD q thurs   Troy Wells 05/02/2019, 7:44 AM  Iron/TIBC/Ferritin/ %Sat No results found for: IRON, TIBC, FERRITIN, IRONPCTSAT Recent Labs  Lab 04/27/19 1419  04/30/19 0443  05/02/19 0527  NA  --    < > 138   < > 140  K  --    < > 5.1   < > 5.3*  CL  --    < > 97*   < > 97*  CO2  --    < > 27   < > 28  GLUCOSE  --    < > 139*   < > 146*  BUN  --    < > 59*   < > 61*  CREATININE  --    < > 9.99*   < > 8.37*  CALCIUM  --    < > 8.1*   < > 8.5*  PHOS  --    < > 6.9*  --   --   ALBUMIN  --   --  2.7*  --   --   INR 1.1  --   --   --   --    < > = values in this interval not displayed.   Recent Labs  Lab 04/27/19 1300  AST 59*  ALT 120*  ALKPHOS 74  BILITOT 1.1  PROT 7.1   Recent Labs  Lab 05/02/19 0527  WBC 7.8  HGB 6.8*  HCT 20.1*  PLT 131*

## 2019-05-02 NOTE — Progress Notes (Signed)
Assisted tele visit to patient with family member.  Shelvia Fojtik M, RN  

## 2019-05-02 NOTE — Progress Notes (Signed)
[facility] PCCM  Hgb < 7 noted HD stable Given situation, will hold off on transfusion until MRI brain performed and goals of care conversation with family   Roselie Awkward, MD [place] [facility] Pager: [phone] Cell: [phone] If no response, call [phone]

## 2019-05-02 NOTE — Progress Notes (Signed)
NAME:  Troy Wells, MRN:  [id number], DOB:  1965-08-28, LOS: 5 ADMISSION DATE:  04/27/2019, CONSULTATION DATE:  11/10 REFERRING MD:  Johnney Killian, CHIEF COMPLAINT:  Cardiac arrest   Brief History   53 y/o male with ESRD and COVID 19 had a cardiac arrest at dialysis, 12 minutes.   Past Medical History  ESRD Chronic systolic heart failure CAD HTN OSA DM2 HLD  Significant Hospital Events   11/10 Admitted 11/11 Myoclonic activity, HD with 2.2L removed 11/12 Fentanyl 100 mcg's.  Any stimulation, he has abnormal motor movements, no follow commands.   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 rate (!) 30, height 5\' 10"  (1.778 m), weight 117.1 kg, SpO2 100 %. CVP:  [14 mmHg-18 mmHg] 18 mmHg  Vent Mode: PRVC FiO2 (%):  [40 %-100 %] 80 % Set Rate:  [20 bmp-30 bmp] 30 bmp Vt Set:  [430 mL] 430 mL PEEP:  [8 cmH20-16 cmH20] 14 cmH20 Plateau Pressure:  [20 cmH20-28 cmH20] 26 cmH20   Intake/Output Summary (Last 24 hours) at  05/02/2019 1009 Last data filed at 05/02/2019 0900 Gross per 24 hour  Intake 2457.26 ml  Output 2451 ml  Net 6.26 ml   Filed Weights   05/01/19 0335 05/01/19 2100 05/02/19 0103  Weight: 120.2 kg 119.5 kg 117.1 kg    Examination:  General:  In bed on vent HENT: NCAT ETT in place PULM: CTA B, vent supported breathing CV: RRR, no mgr GI: BS+, soft, nontender MSK: normal bulk and tone Neuro: sedated on vent  CXR 11/15 improved airspace disease   Resolved Hospital Problem list     Assessment & Plan:  Severe acute respiratory failure with hypoxemia, recent COVID 19 pneumonia: improving oxygenation Acute/flash pulmonary edema seems to be bigger picture for now Continue mechanical ventilation per ARDS protocol Target TVol 6-8cc/kgIBW Target Plateau Pressure < 30cm H20 Target driving pressure less than 15 cm of water Target PaO2 55-65: titrate PEEP/FiO2 per protocol As long as PaO2 to FiO2 ratio is less than 1:150 position in prone position for 16 hours a day Check CVP daily if CVL in place Target CVP less than 4, diurese as necessary Ventilator associated pneumonia prevention protocol 11/14 plan: wean PEEP/FiO2 per protocol  ESRD:  HD again per renal Monitor BMET and UOP Replace electrolytes as needed  Need for sedation for mechanical ventilation: high needs with vent dyssynchrony on 11/14 Maintain intermittent paralytic protocol this morning until after MRI, then back to RASS goal -2 per PAD protocol Fentanyl/versed infusions for now  PEA arrest on admission> due to respiratory failure  Concern for anoxic  brain injury: prognosis seems poor MRI Brain today Keppra to continue  CAD Tele Holding home b-blocker, norvasc, minoxidil, demadex  DM2 SSI  Constipation Miralax  Best practice:  Diet: tube feeding per protocol Pain/Anxiety/Delirium protocol (if indicated): as above VAP protocol (if indicated): yes DVT prophylaxis: sub q heparin per protocol GI  prophylaxis: famotidine  Glucose control: SSI Mobility: bed rest Code Status: Full Family Communication: I updated his daughter and ex-wife today by phone Disposition: remain in ICU  Labs   CBC: Recent Labs  Lab 04/27/19 1300  04/28/19 0622  04/29/19 0402 04/29/19 0424 04/30/19 0443 05/01/19 0221 05/01/19 1501 05/02/19 0527  WBC 15.8*   < > 13.2*  --  12.6*  --  11.0* 8.9  --  7.8  NEUTROABS 10.3*  --   --   --   --   --   --   --   --  5.9  HGB 9.9*   < > 9.1*   < > 8.9* 9.5* 8.4* 7.7* 9.9* 6.8*  HCT 31.8*   < > 26.7*   < > 26.8* 28.0* 25.1* 23.5* 29.0* 20.1*  MCV 88.3   < > 81.9  --  83.5  --  82.6 83.0  --  82.0  PLT 266   < > 215  --  168  --  142* 131*  --  131*   < > = values in this interval not displayed.    Basic Metabolic Panel: Recent Labs  Lab 04/27/19 1300  04/27/19 1659  04/28/19 0622  04/29/19 0402 04/29/19 0424 04/30/19 0443 05/01/19 0221 05/01/19 1501 05/02/19 0527  NA 138   < >  --    < > 139   < > 137 134* 138 136 134* 140  K 5.6*   < >  --    < > 6.1*   < > 5.3* 5.2* 5.1 5.9* 6.1* 5.3*  CL 96*   < >  --   --  94*  --  96*  --  97* 95*  --  97*  CO2 22  --   --   --  26  --  26  --  27 25  --  28  GLUCOSE 237*   < >  --   --  112*  --  158*  --  139* 134*  --  146*  BUN 94*   < >  --   --  108*  --  68*  --  59* 90*  --  61*  CREATININE 14.70*   < >  --   --  16.39*  --  12.27*  --  9.99* 11.43*  --  8.37*  CALCIUM 8.3*  --   --   --  7.9*  --  7.9*  --  8.1* 8.2*  --  8.5*  MG 2.8*  --  2.5*  --  2.4  --  2.2  --   --   --   --   --   PHOS 7.9*  --   --   --   --   --  7.5*  --  6.9*  --   --   --    < > = values in this interval not displayed.   GFR: Estimated Creatinine Clearance: 12.9 mL/min (A) (by C-G formula based on SCr of 8.37 mg/dL (H)). Recent Labs  Lab 04/27/19 1307 04/27/19 1659 04/27/19 1842  04/29/19 0402 04/30/19 0443 05/01/19 0221 05/02/19 3474  PROCALCITON  --  1.30  --   --   --   --   --   --   WBC  --  15.9*  --     < > 12.6* 11.0* 8.9 7.8  LATICACIDVEN 8.7* 1.7 2.3*  --   --   --   --   --    < > = values in this interval not displayed.    Liver Function Tests: Recent Labs  Lab 04/27/19 1300 04/30/19 0443  AST 59*  --   ALT 120*  --   ALKPHOS 74  --   BILITOT 1.1  --   PROT 7.1  --   ALBUMIN 3.6 2.7*   No results for input(s): LIPASE, AMYLASE in the last 168 hours. Recent Labs  Lab 04/27/19 1659  AMMONIA 50*    ABG    Component Value Date/Time   PHART 7.061 (LL) 05/01/2019 1501   PCO2ART 78.0 (HH) 05/01/2019 1501   PO2ART 42.0 (L) 05/01/2019 1501   HCO3 22.1 05/01/2019 1501   TCO2 24 05/01/2019 1501   ACIDBASEDEF 9.0 (H) 05/01/2019 1501   O2SAT 54.0 05/01/2019 1501     Coagulation Profile: Recent Labs  Lab 04/27/19 1419  INR 1.1    Cardiac Enzymes: No results for input(s): CKTOTAL, CKMB, CKMBINDEX, TROPONINI in the last 168 hours.  HbA1C: Hemoglobin-A1c  Date/Time Value Ref Range Status  09/14/2009 09:43 AM 8.5 (H) 5.4 - 7.4 % Final   Hemoglobin A1C  Date/Time Value Ref Range Status  11/21/2016 02:33 PM 5.3  Final  04/02/2016 10:05 AM 6.1  Final   HbA1c, POC (controlled diabetic range)  Date/Time Value Ref Range Status  10/21/2018 11:25 AM 5.7 0.0 - 7.0 % Final   Hgb A1c MFr Bld  Date/Time Value Ref Range Status  04/14/2019 02:00 AM 5.8 (H) 4.8 - 5.6 % Final    Comment:    (NOTE) Pre diabetes:          5.7%-6.4% Diabetes:              >6.4% Glycemic control for   <7.0% adults with diabetes     CBG: Recent Labs  Lab 05/01/19 1538 05/01/19 2027 05/02/19 0035 05/02/19 0530 05/02/19 0750  GLUCAP 177* 110* 90 140* 151*     Critical care time: 33 minutes      Roselie Awkward, MD [place] [facility] Pager: [phone] Cell: [phone] If no response, call [phone]

## 2019-05-02 NOTE — Progress Notes (Signed)
CRITICAL VALUE ALERT  Critical Value:  hgb 6.8  Date & Time Notied:  05/02/19 0630  Provider Notified: Warren Lacy  Orders Received/Actions taken: see new orders

## 2019-05-02 NOTE — Progress Notes (Signed)
Patient transported to MRI on the MRI ventilator. SpO2 dropped to 85% when transferring to MRI bed. PEEP increased to 16 & SpO2-100%. Patient in scan at this moment. Will hand off patient to RT coming on for night shift.  Ashley Mariner RRT

## 2019-05-02 NOTE — Progress Notes (Signed)
[facility] Progress Note Patient Name: Troy Wells DOB: 1965-12-17 MRN: [id number]   Date of Service  05/02/2019  HPI/Events of Note  Post MRI, NG tube mis placed, so was re placed. VS stable. MRI suspected ABI.  Camera: Talked to bed side RN.  covid presumptive +. In synchrony with stable VS.   eICU Interventions  - continue care. Watch for worsening hypoxemia, if aspirated earlier post MRI.      Intervention Category Minor Interventions: Other:  Elmer Sow 05/02/2019, 10:37 PM

## 2019-05-03 DIAGNOSIS — U071 COVID-19: Secondary | ICD-10-CM | POA: Diagnosis not present

## 2019-05-03 DIAGNOSIS — J9601 Acute respiratory failure with hypoxia: Secondary | ICD-10-CM | POA: Diagnosis not present

## 2019-05-03 LAB — POCT I-STAT 7, (LYTES, BLD GAS, ICA,H+H)
Acid-Base Excess: 3 mmol/L — ABNORMAL HIGH (ref 0.0–2.0)
Bicarbonate: 28 mmol/L (ref 20.0–28.0)
Calcium, Ion: 1.18 mmol/L (ref 1.15–1.40)
HCT: 24 % — ABNORMAL LOW (ref 39.0–52.0)
Hemoglobin: 8.2 g/dL — ABNORMAL LOW (ref 13.0–17.0)
O2 Saturation: 98 %
Patient temperature: 100.6
Potassium: 4.8 mmol/L (ref 3.5–5.1)
Sodium: 138 mmol/L (ref 135–145)
TCO2: 29 mmol/L (ref 22–32)
pCO2 arterial: 45 mmHg (ref 32.0–48.0)
pH, Arterial: 7.406 (ref 7.350–7.450)
pO2, Arterial: 115 mmHg — ABNORMAL HIGH (ref 83.0–108.0)

## 2019-05-03 LAB — HEMOGLOBIN AND HEMATOCRIT, BLOOD
HCT: 21.6 % — ABNORMAL LOW (ref 39.0–52.0)
Hemoglobin: 7.4 g/dL — ABNORMAL LOW (ref 13.0–17.0)

## 2019-05-03 LAB — CBC WITH DIFFERENTIAL/PLATELET
Abs Immature Granulocytes: 0.02 10*3/uL (ref 0.00–0.07)
Basophils Absolute: 0 10*3/uL (ref 0.0–0.1)
Basophils Relative: 0 %
Eosinophils Absolute: 0.1 10*3/uL (ref 0.0–0.5)
Eosinophils Relative: 2 %
HCT: 17.7 % — ABNORMAL LOW (ref 39.0–52.0)
Hemoglobin: 5.8 g/dL — CL (ref 13.0–17.0)
Immature Granulocytes: 0 %
Lymphocytes Relative: 15 %
Lymphs Abs: 0.9 10*3/uL (ref 0.7–4.0)
MCH: 27.6 pg (ref 26.0–34.0)
MCHC: 32.8 g/dL (ref 30.0–36.0)
MCV: 84.3 fL (ref 80.0–100.0)
Monocytes Absolute: 0.8 10*3/uL (ref 0.1–1.0)
Monocytes Relative: 14 %
Neutro Abs: 4 10*3/uL (ref 1.7–7.7)
Neutrophils Relative %: 69 %
Platelets: 110 10*3/uL — ABNORMAL LOW (ref 150–400)
RBC: 2.1 MIL/uL — ABNORMAL LOW (ref 4.22–5.81)
RDW: 17 % — ABNORMAL HIGH (ref 11.5–15.5)
WBC: 6 10*3/uL (ref 4.0–10.5)
nRBC: 0 % (ref 0.0–0.2)

## 2019-05-03 LAB — BASIC METABOLIC PANEL
Anion gap: 15 (ref 5–15)
BUN: 100 mg/dL — ABNORMAL HIGH (ref 6–20)
CO2: 25 mmol/L (ref 22–32)
Calcium: 9 mg/dL (ref 8.9–10.3)
Chloride: 96 mmol/L — ABNORMAL LOW (ref 98–111)
Creatinine, Ser: 10.7 mg/dL — ABNORMAL HIGH (ref 0.61–1.24)
GFR calc Af Amer: 6 mL/min — ABNORMAL LOW (ref >60)
GFR calc non Af Amer: 5 mL/min — ABNORMAL LOW (ref >60)
Glucose, Bld: 119 mg/dL — ABNORMAL HIGH (ref 70–99)
Potassium: 5.9 mmol/L — ABNORMAL HIGH (ref 3.5–5.1)
Sodium: 136 mmol/L (ref 135–145)

## 2019-05-03 LAB — GLUCOSE, CAPILLARY
Glucose-Capillary: 86 mg/dL (ref 70–99)
Glucose-Capillary: 107 mg/dL — ABNORMAL HIGH (ref 70–99)
Glucose-Capillary: 68 mg/dL — ABNORMAL LOW (ref 70–99)
Glucose-Capillary: 171 mg/dL — ABNORMAL HIGH (ref 70–99)
Glucose-Capillary: 78 mg/dL (ref 70–99)
Glucose-Capillary: 84 mg/dL (ref 70–99)
Glucose-Capillary: 105 mg/dL — ABNORMAL HIGH (ref 70–99)
Glucose-Capillary: 88 mg/dL (ref 70–99)

## 2019-05-03 LAB — PREPARE RBC (CROSSMATCH)

## 2019-05-03 LAB — PHOSPHORUS: Phosphorus: 7.5 mg/dL — ABNORMAL HIGH (ref 2.5–4.6)

## 2019-05-03 LAB — ABO/RH: ABO/RH(D): O POS

## 2019-05-03 MED ORDER — DEXTROSE 50 % IV SOLN
12.5000 g | INTRAVENOUS | Status: AC
  Administered 2019-05-03: 12.5 g via INTRAVENOUS

## 2019-05-03 MED ORDER — SODIUM CHLORIDE 0.9% IV SOLUTION: Freq: Once | INTRAVENOUS | Status: DC

## 2019-05-03 MED ORDER — DEXTROSE 50 % IV SOLN
INTRAVENOUS | Status: AC
  Filled 2019-05-03: qty 50

## 2019-05-03 MED ORDER — DARBEPOETIN ALFA 200 MCG/0.4ML IJ SOSY
200.0000 ug | PREFILLED_SYRINGE | INTRAMUSCULAR | Status: DC
  Administered 2019-05-04 – 2019-06-03 (×5): 200 ug via INTRAVENOUS
  Filled 2019-05-03 (×6): qty 0.4

## 2019-05-03 MED ORDER — CHLORHEXIDINE GLUCONATE CLOTH 2 % EX PADS
6.0000 | MEDICATED_PAD | Freq: Every day | CUTANEOUS | Status: DC
  Administered 2019-05-04 – 2019-05-05 (×2): 6 via TOPICAL

## 2019-05-03 MED ORDER — HEPARIN SODIUM (PORCINE) 1000 UNIT/ML DIALYSIS
6000.0000 [IU] | INTRAMUSCULAR | Status: DC | PRN
  Filled 2019-05-03: qty 6

## 2019-05-03 MED ORDER — SEVELAMER CARBONATE 0.8 G PO PACK
0.8000 g | PACK | Freq: Three times a day (TID) | ORAL | Status: DC
  Administered 2019-05-03 – 2019-05-17 (×34): 0.8 g via ORAL
  Filled 2019-05-03 (×36): qty 1

## 2019-05-03 MED ORDER — ALTEPLASE 2 MG IJ SOLR
2.0000 mg | Freq: Once | INTRAMUSCULAR | Status: DC
  Filled 2019-05-03: qty 2

## 2019-05-03 NOTE — Progress Notes (Signed)
Attempted video call with daughter, [phone] per request. No answer after several attempts.

## 2019-05-03 NOTE — Progress Notes (Signed)
Subjective: MRI brain has been completed.   Objective: Current vital signs: BP (!) 135/55   Pulse 83   Temp 99.1 F (37.3 C) (Oral)   Resp (!) 26   Ht _0  (1.778 m)   Wt 119.4 kg   SpO2 100%   BMI 37.77 kg/m  Vital signs in last 24 hours: Temp:  [98.8 F (37.1 C)-102.4 F (39.1 C)] 99.1 F (37.3 C) (11/16 0400) Pulse Rate:  [37-136] 83 (11/16 0600) Resp:  [17-30] 26 (11/16 0700) BP: (85-191)/(38-100) 135/55 (11/16 0700) SpO2:  [62 %-100 %] 100 % (11/16 0600) FiO2 (%):  [60 %-80 %] 60 % (11/16 0412) Weight:  [119.4 kg] 119.4 kg (11/16 0251)  Intake/Output from previous day: 11/15 0701 - 11/16 0700 In: 1655 [I.V.:1015; NG/GT:540; IV Piggyback:100] Out: -  Intake/Output this shift: No intake/output data recorded. Nutritional status:  Diet Order            Diet NPO time specified  Diet effective now             HEENT: [place]/AT Lungs: Intubated Ext: Warm and well perfused  Neurologic Exam: Ment: On Versed and fentanyl sedation. No movement spontaneously or to any external stimuli. No eye opening. Not responding to commands. No attempts to communicate.  CN: Pupils unreactive, 2 mm bilaterally. No blink to threat. No doll's eye reflex. Weak corneal reflexes present bilaterally.  Motor/Sensory: No movement to any stimuli. All 4 limbs with flaccid tone.  Reflexes: 3+ bilateral brachioradialis and biceps. 3+ right patellar. 4+ left patellar. 2+ achilles bilaterally.   Lab Results: Results for orders placed or performed during the hospital encounter of 04/27/19 (from the past 48 hour(s))  Glucose, capillary     Status: Abnormal   Collection Time: 05/01/19  8:00 AM  Result Value Ref Range   Glucose-Capillary 173 (H) 70 - 99 mg/dL  Glucose, capillary     Status: Abnormal   Collection Time: 05/01/19 11:53 AM  Result Value Ref Range   Glucose-Capillary 121 (H) 70 - 99 mg/dL  I-STAT 7, (LYTES, BLD GAS, ICA, H+H)     Status: Abnormal   Collection Time: 05/01/19  3:01 PM   Result Value Ref Range   pH, Arterial 7.061 (LL) 7.350 - 7.450   pCO2 arterial 78.0 (HH) 32.0 - 48.0 mmHg   pO2, Arterial 42.0 (L) 83.0 - 108.0 mmHg   Bicarbonate 22.1 20.0 - 28.0 mmol/L   TCO2 24 22 - 32 mmol/L   O2 Saturation 54.0 %   Acid-base deficit 9.0 (H) 0.0 - 2.0 mmol/L   Sodium 134 (L) 135 - 145 mmol/L   Potassium 6.1 (H) 3.5 - 5.1 mmol/L   Calcium, Ion 1.15 1.15 - 1.40 mmol/L   HCT 29.0 (L) 39.0 - 52.0 %   Hemoglobin 9.9 (L) 13.0 - 17.0 g/dL   Patient temperature 98.8 F    Collection site RADIAL, ALLEN'S TEST ACCEPTABLE    Drawn by Operator    Sample type ARTERIAL    Comment NOTIFIED PHYSICIAN   Glucose, capillary     Status: Abnormal   Collection Time: 05/01/19  3:38 PM  Result Value Ref Range   Glucose-Capillary 177 (H) 70 - 99 mg/dL  Glucose, capillary     Status: Abnormal   Collection Time: 05/01/19  8:27 PM  Result Value Ref Range   Glucose-Capillary 110 (H) 70 - 99 mg/dL  Glucose, capillary     Status: None   Collection Time: 05/02/19 12:35 AM  Result Value Ref  Range   Glucose-Capillary 90 70 - 99 mg/dL  Basic metabolic panel in am     Status: Abnormal   Collection Time: 05/02/19  5:27 AM  Result Value Ref Range   Sodium 140 135 - 145 mmol/L   Potassium 5.3 (H) 3.5 - 5.1 mmol/L   Chloride 97 (L) 98 - 111 mmol/L   CO2 28 22 - 32 mmol/L   Glucose, Bld 146 (H) 70 - 99 mg/dL   BUN 61 (H) 6 - 20 mg/dL   Creatinine, Ser 8.37 (H) 0.61 - 1.24 mg/dL   Calcium 8.5 (L) 8.9 - 10.3 mg/dL   GFR calc non Af Amer 7 (L) >60 mL/min   GFR calc Af Amer 8 (L) >60 mL/min   Anion gap 15 5 - 15    Comment: Performed at [facility] [address] [place], [place] [address]  CBC with Differential     Status: Abnormal   Collection Time: 05/02/19  5:27 AM  Result Value Ref Range   WBC 7.8 4.0 - 10.5 K/uL   RBC 2.45 (L) 4.22 - 5.81 MIL/uL   Hemoglobin 6.8 (LL) 13.0 - 17.0 g/dL    Comment: REPEATED TO VERIFY THIS CRITICAL RESULT HAS VERIFIED AND BEEN CALLED TO  COURTNEY WOFFORD,RN BY ZELDA BEECH ON 11 15 2020 AT 0617, AND HAS BEEN READ BACK.     HCT 20.1 (L) 39.0 - 52.0 %   MCV 82.0 80.0 - 100.0 fL   MCH 27.8 26.0 - 34.0 pg   MCHC 33.8 30.0 - 36.0 g/dL   RDW 17.2 (H) 11.5 - 15.5 %   Platelets 131 (L) 150 - 400 K/uL   nRBC 0.4 (H) 0.0 - 0.2 %   Neutrophils Relative % 76 %   Neutro Abs 5.9 1.7 - 7.7 K/uL   Lymphocytes Relative 12 %   Lymphs Abs 1.0 0.7 - 4.0 K/uL   Monocytes Relative 10 %   Monocytes Absolute 0.8 0.1 - 1.0 K/uL   Eosinophils Relative 1 %   Eosinophils Absolute 0.1 0.0 - 0.5 K/uL   Basophils Relative 0 %   Basophils Absolute 0.0 0.0 - 0.1 K/uL   Immature Granulocytes 1 %   Abs Immature Granulocytes 0.04 0.00 - 0.07 K/uL    Comment: Performed at [facility] [address] [place], [place] [address]  Glucose, capillary     Status: Abnormal   Collection Time: 05/02/19  5:30 AM  Result Value Ref Range   Glucose-Capillary 140 (H) 70 - 99 mg/dL  Glucose, capillary     Status: Abnormal   Collection Time: 05/02/19  7:50 AM  Result Value Ref Range   Glucose-Capillary 151 (H) 70 - 99 mg/dL  Glucose, capillary     Status: Abnormal   Collection Time: 05/02/19 11:39 AM  Result Value Ref Range   Glucose-Capillary 149 (H) 70 - 99 mg/dL  Glucose, capillary     Status: Abnormal   Collection Time: 05/02/19  4:09 PM  Result Value Ref Range   Glucose-Capillary 144 (H) 70 - 99 mg/dL  Glucose, capillary     Status: Abnormal   Collection Time: 05/02/19  7:58 PM  Result Value Ref Range   Glucose-Capillary 116 (H) 70 - 99 mg/dL  Glucose, capillary     Status: None   Collection Time: 05/02/19 11:56 PM  Result Value Ref Range   Glucose-Capillary 86 70 - 99 mg/dL  Glucose, capillary     Status: Abnormal   Collection  Time: 05/03/19  3:50 AM  Result Value Ref Range   Glucose-Capillary 107 (H) 70 - 99 mg/dL  Phosphorus     Status: Abnormal   Collection Time: 05/03/19  3:54 AM  Result Value Ref Range   Phosphorus 7.5 (H) 2.5 -  4.6 mg/dL    Comment: Performed at [facility] [address], [place], [place] [address]  Basic metabolic panel in am     Status: Abnormal   Collection Time: 05/03/19  3:54 AM  Result Value Ref Range   Sodium 136 135 - 145 mmol/L   Potassium 5.9 (H) 3.5 - 5.1 mmol/L   Chloride 96 (L) 98 - 111 mmol/L   CO2 25 22 - 32 mmol/L   Glucose, Bld 119 (H) 70 - 99 mg/dL   BUN 100 (H) 6 - 20 mg/dL   Creatinine, Ser 10.70 (H) 0.61 - 1.24 mg/dL   Calcium 9.0 8.9 - 10.3 mg/dL   GFR calc non Af Amer 5 (L) >60 mL/min   GFR calc Af Amer 6 (L) >60 mL/min   Anion gap 15 5 - 15    Comment: Performed at [facility] [address], [place], [place] [address]  CBC with Differential     Status: Abnormal   Collection Time: 05/03/19  3:54 AM  Result Value Ref Range   WBC 6.0 4.0 - 10.5 K/uL   RBC 2.10 (L) 4.22 - 5.81 MIL/uL   Hemoglobin 5.8 (LL) 13.0 - 17.0 g/dL    Comment: REPEATED TO VERIFY CRITICAL VALUE NOTED.  VALUE IS CONSISTENT WITH PREVIOUSLY REPORTED AND CALLED VALUE.    HCT 17.7 (L) 39.0 - 52.0 %   MCV 84.3 80.0 - 100.0 fL   MCH 27.6 26.0 - 34.0 pg   MCHC 32.8 30.0 - 36.0 g/dL   RDW 17.0 (H) 11.5 - 15.5 %   Platelets 110 (L) 150 - 400 K/uL    Comment: REPEATED TO VERIFY PLATELET COUNT CONFIRMED BY SMEAR SPECIMEN CHECKED FOR CLOTS Immature Platelet Fraction may be clinically indicated, consider ordering this additional test [id number]    nRBC 0.0 0.0 - 0.2 %   Neutrophils Relative % 69 %   Neutro Abs 4.0 1.7 - 7.7 K/uL   Lymphocytes Relative 15 %   Lymphs Abs 0.9 0.7 - 4.0 K/uL   Monocytes Relative 14 %   Monocytes Absolute 0.8 0.1 - 1.0 K/uL   Eosinophils Relative 2 %   Eosinophils Absolute 0.1 0.0 - 0.5 K/uL   Basophils Relative 0 %   Basophils Absolute 0.0 0.0 - 0.1 K/uL   Immature Granulocytes 0 %   Abs Immature Granulocytes 0.02 0.00 - 0.07 K/uL    Comment: Performed at [facility] [address] [place], [place] [address]  Type and screen [place]     Status: None   Collection Time: 05/03/19  6:30 AM  Result Value Ref Range   ABO/RH(D) O POS    Antibody Screen NEG    Sample Expiration      05/06/2019,2359 Performed at [facility], [place] [address], [place], [place] [address]   Prepare RBC     Status: None   Collection Time: 05/03/19  6:30 AM  Result Value Ref Range   Order Confirmation      ORDER PROCESSED BY BLOOD BANK Performed at [facility], [place] [address], [place],  [address]     Recent Results (from the past 240  hour(s))  Culture, blood (routine x 2)     Status: None   Collection Time: 04/27/19  1:07 PM   Specimen: BLOOD  Result Value Ref Range Status   Specimen Description BLOOD BLOOD LEFT HAND  Final   Special Requests   Final    BOTTLES DRAWN AEROBIC AND ANAEROBIC Blood Culture adequate volume   Culture   Final    NO GROWTH 5 DAYS Performed at [facility], [address] [place], [place] [address]    Report Status 05/02/2019 FINAL  Final  Culture, blood (routine x 2)     Status: None   Collection Time: 04/27/19  5:08 PM   Specimen: BLOOD  Result Value Ref Range Status   Specimen Description BLOOD RIGHT ANTECUBITAL  Final   Special Requests   Final    BOTTLES DRAWN AEROBIC AND ANAEROBIC Blood Culture adequate volume   Culture   Final    NO GROWTH 5 DAYS Performed at [facility], [place] [address], [place], [place] [address]    Report Status 05/02/2019 FINAL  Final    Lipid Panel No results for input(s): CHOL, TRIG, HDL, CHOLHDL, VLDL, LDLCALC in the last 72 hours.  Studies/Results: Dg Abd 1 View  Result Date: 05/02/2019 CLINICAL DATA:  NG tube placement EXAM: ABDOMEN - 1 VIEW COMPARISON:  None. FINDINGS: NG tube tip is in the stomach.  Nonobstructive bowel gas pattern. IMPRESSION: NG tube tip in the stomach. Electronically Signed   By: Rolm Baptise M.D.   On: 05/02/2019 23:23   Mr Brain Wo Contrast  Result Date: 05/02/2019 CLINICAL DATA:   53 year old male with COVID-19 status post cardiac arrest at dialysis. Anoxic brain damage. EXAM: MRI HEAD WITHOUT CONTRAST TECHNIQUE: Multiplanar, multiecho pulse sequences of the brain and surrounding structures were obtained without intravenous contrast. COMPARISON:  Head CT 04/29/2019. FINDINGS: Brain: 3 T exam. Heterogeneous DWI, but without evidence of diffuse cortical signal abnormality on T2 or FLAIR imaging. However, there is hyperintensity in the bilateral medial thalami on DWI (greater on the right series 2, image 28), T2 and FLAIR imaging (symmetric series 7, image 15). However, its unclear how much this area might be restricted (ADC appears fairly isointense series 250, image 28). No associated hemorrhage or mass effect. The bilateral basal ganglia and brainstem appear to remain normal. Chronic right PICA cerebellar infarct again noted. No other convincing acute diffusion abnormality. Occasional scattered nonspecific cerebral white matter T2 and FLAIR hyperintense foci. No other chronic encephalomalacia. No chronic cerebral blood products identified. No midline shift, mass effect, evidence of mass lesion, ventriculomegaly, extra-axial collection or acute intracranial hemorrhage. Cervicomedullary junction and pituitary are within normal limits. Vascular: Major intracranial vascular flow voids are preserved. The left vertebral artery appears dominant. Skull and upper cervical spine: Negative visible cervical spine. Visualized bone marrow signal is within normal limits. Sinuses/Orbits: Negative orbits. Generalized paranasal sinus mucosal thickening. Left nasoenteric tube in place. Intubated. Other: Bilateral mastoid effusions. Mild right forehead scalp soft tissue swelling or edema on series 5, image 15. Other scalp and face soft tissues appear negative. IMPRESSION: 1. Heterogeneous signal in the bilateral medial thalami is suspicious for anoxic injury in this clinical setting. Artifact or remote thalamic  insults are felt less likely. 2. No other acute intracranial abnormality identified. Chronic right PICA cerebellar infarct. 3. Intubated with left nasoenteric tube in place. Diffuse paranasal sinus and mastoid inflammation. Electronically Signed   By: Genevie Ann M.D.   On: 05/02/2019 19:33  Dg Chest Port 1 View  Result Date: 05/02/2019 CLINICAL DATA:  COVID positive. EXAM: PORTABLE CHEST 1 VIEW COMPARISON:  05/01/2019 FINDINGS: ET tube tip is above the carina. There is a right IJ catheter with tip projecting over the SVC. The NG tube is identified with side port and tip below GE junction. Stable cardiac enlargement. Diffuse hazy interstitial and airspace opacities are again noted. When compared with the previous exam there is been significantly improved aeration to both lungs, especially the left midlung and left base. IMPRESSION: 1. Improving aeration to both lungs compared with 05/01/2019 Electronically Signed   By: Kerby Moors M.D.   On: 05/02/2019 06:07   Dg Chest Port 1 View  Result Date: 05/01/2019 CLINICAL DATA:  Central line placement, endotracheal tube placement EXAM: PORTABLE CHEST 1 VIEW COMPARISON:  05/01/2019, 4:40 a.m. FINDINGS: Marked interval increase in diffuse, very dense bilateral heterogeneous and consolidative opacity and a probable moderate left pleural effusion. Interval placement of endotracheal tube, tip projecting over the mid trachea. Esophagogastric tube with tip and side port below the diaphragm. Right neck vascular catheter, tip projecting over the SVC. Cardiomegaly. IMPRESSION: 1. Marked interval increase in diffuse, very dense bilateral heterogeneous and consolidative opacity and a probable moderate left pleural effusion. Findings are most consistent with florid pulmonary edema and/or ARDS. 2. Interval placement of endotracheal tube, tip projecting over the mid trachea. Esophagogastric tube with tip and side port below the diaphragm. Right neck vascular catheter, tip  projecting over the SVC. Electronically Signed   By: Eddie Candle M.D.   On: 05/01/2019 15:44    Medications:  Scheduled: . sodium chloride   Intravenous Once  . artificial tears  1 application Both Eyes H3Z  . aspirin  81 mg Per Tube Daily  . atorvastatin  80 mg Per Tube Daily  . chlorhexidine gluconate (MEDLINE KIT)  15 mL Mouth Rinse BID  . Chlorhexidine Gluconate Cloth  6 each Topical Q0600  . [START ON 05/04/2019] darbepoetin (ARANESP) injection - DIALYSIS  200 mcg Intravenous Q Tue-HD  . docusate  100 mg Per Tube BID  . famotidine  10.4 mg Per Tube Q0600  . feeding supplement (PRO-STAT SUGAR FREE 64)  30 mL Per Tube QID  . heparin injection (subcutaneous)  10,000 Units Subcutaneous Q8H  . insulin aspart  2-6 Units Subcutaneous Q4H  . mouth rinse  15 mL Mouth Rinse 10 times per day  . polyethylene glycol  17 g Per Tube Daily   Continuous: . feeding supplement (VITAL 1.5 CAL) 1,000 mL (05/02/19 0533)  . fentaNYL infusion INTRAVENOUS 200 mcg/hr (05/03/19 0500)  . levETIRAcetam Stopped (05/02/19 2254)  . midazolam 10 mg/hr (05/03/19 0644)  . phenylephrine (NEO-SYNEPHRINE) Adult infusion Stopped (05/02/19 1257)   MRI brain: 1. Heterogeneous signal in the bilateral medial thalami is suspicious for anoxic injury in this clinical setting. Artifact or remote thalamic insults are felt less likely. 2. No other acute intracranial abnormality identified. Chronic right PICA cerebellar infarct. 3. Intubated with left nasoenteric tube in place. Diffuse paranasal sinus and mastoid inflammation.  Assessment: 53 year old male with ESRD and Covid-19, s/p cardiac arrest at dialysis x 12 minutes 1. MRI brain reveals FLAIR and DWI signal abnormalities within the medial thalami bilaterally, suspicious for anoxic brain injury.  2. Exam on sedation not informative regarding cortical function. Corneal reflexes are present, but no oculocephalic or pupillary light reflex present.  3. LTM EEG on 11/13  showed no epileptiform discharges.    Recommendations: 1. Wean  off sedation.  2. Repeat EEG 24 hours after all sedation stopped.  3. Repeat Neurological exam 24 hours after all sedation stopped.   35 minutes spent in the neurological evaluation and management of this critically ill patient. Time spent included review of MRI and coordination of care.    LOS: 6 days   _0  signed: Dr. Kerney Elbe 05/03/2019  7:59 AM

## 2019-05-03 NOTE — Progress Notes (Signed)
Patient's daughter Marijo Conception faxed COVID negative results from the [facility] that were from 04/30/2019. Copy placed in chart . Dr Chase Caller and Georgann Housekeeper NP informed

## 2019-05-03 NOTE — Progress Notes (Addendum)
NAME:  Troy Wells, MRN:  [id number], DOB:  1965-11-26, LOS: 6 ADMISSION DATE:  04/27/2019, CONSULTATION DATE:  11/10 REFERRING MD:  Johnney Killian, CHIEF COMPLAINT:  Cardiac arrest   Brief History   52 y/o male with ESRD and COVID 19 had a cardiac arrest at dialysis, 12 minutes.   Past Medical History  ESRD Chronic systolic heart failure CAD HTN OSA DM2 HLD  Significant Hospital Events   11/10 Admitted 11/11 Myoclonic activity, HD with 2.2L removed 11/12 Fentanyl 100 mcg's.  Any stimulation, he has abnormal motor movements, no follow commands.   11/13 Fentanyl 100 mcg's, doppler negative, heparin gtt stopped 11/14 PM had severe hypoxemia, flash edema, emergent bronchoscopy frothy secretions 11/16 MRI concerning for anoxic injury  Consults:  PCCM  Procedures:  ETT 11/10 >> R IJ CVL 11/14 >   Significant Diagnostic Tests:  CXR 11/10 > Bilateral airspace disease suggestive of volume overload ECHO 11/10 >> LVEF 40-45%, mild LVH, basal to mid anterolateral severe hypokinesis, grade II diastolic dysfunction, global RV function normal, LA/RA mildly dilated LE Venous Duplex 11/11 >>  CT Head 11/12 >> no acute abnormality, old R cerebellar infarct 11/13 EEG > severe diffuse encephalopathy, non-specific in etiology 11/14 bronch> frothy bloody secretions MRI brain 11/15 > Heterogeneous signal in the bilateral medial thalami is suspicious for anoxic injury in this clinical setting. Artifact or remote thalamic insults are felt less likely. No other acute intracranial abnormality identified. Chronic right PICA cerebellar infarct.  Micro Data:  SARS COV 2 10/28 > positive BCx2 11/10 >> Tracheal aspirate 11/10 >   Antimicrobials:    Interim history/subjective:   No acute events overnight. MRI done overnight with results as above.   Objective   Blood pressure 135/64, pulse 93, temperature 100.3 F (37.9 C), temperature source Oral, resp. rate (!) 30, height 5\' 10"  (1.778 m),  weight 119.4 kg, SpO2 99 %. CVP:  [10 mmHg-16 mmHg] 12 mmHg  Vent Mode: PRVC FiO2 (%):  [50 %-60 %] 50 % Set Rate:  [30 bmp] 30 bmp Vt Set:  [430 mL] 430 mL PEEP:  [10 cmH20-16 cmH20] 10 cmH20 Plateau Pressure:  [23 cmH20-26 cmH20] 25 cmH20   Intake/Output Summary (Last 24 hours) at 05/03/2019 1118 Last data filed at 05/03/2019 1100 Gross per 24 hour  Intake 1512.44 ml  Output -  Net 1512.44 ml   Filed Weights   05/01/19 2100 05/02/19 0103 05/03/19 0251  Weight: 119.5 kg 117.1 kg 119.4 kg    Examination: General:  Middle aged male on vent in NAD Neuro:  No eye opening, does not follow commands. Facial grimace to painful stimuli.  HEENT:  [place]/AT, No JVD noted, PERRL Cardiovascular:  RRR, no MRG Lungs:  Coarse bilaterally.  Abdomen:  Soft, non-distended, normoactive Musculoskeletal:  No acute deformity Skin:  Intact, MMM   Resolved Hospital Problem list     Assessment & Plan:  Severe acute respiratory failure with hypoxemia: recent COVID 19 pneumonia so of course concern for this as the etiology, however, acute/flash pulmonary edema seems to be bigger picture for now.  - Oxygenation continues to improve, presumably as a result of volume removal/HD  - Continue mechanical ventilation per ARDS protocol - Target TVol 6-8cc/kgIBW - Target Plateau Pressure < 30cm H20 - Target driving pressure less than 15 cm of water - Target PaO2 55-65: titrate PEEP/FiO2 per protocol - As long as PaO2 to FiO2 ratio is less than 1:150 position in prone position for 16 hours a day - CVP -  Ventilator associated pneumonia prevention protocol - 11/16 plan: wean PEEP/FiO2 per protocol, wean sedation as tolerated.   ESRD:  Hyperkalemia - HD again today 11/16 and tomorrow 11/17 - Monitor BMET and UOP - Replace electrolytes as needed  Need for sedation for mechanical ventilation: high needs with vent dyssynchrony on 11/14 - RASS goal -2 per PAD protocol - Fentanyl/versed infusions for now,  weaning.  - DC PRN NMB protocol  PEA arrest on admission> due to respiratory failure - [place] with family 54/56 > 25/63 elicits FULL CODE and full scope of treatment.   Concern for anoxic brain injury: prognosis seems poor - Keppra to continue - Neurology has seen today and will follow up once sedation weaned.   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 Stool non-suspicious. - Transfused one unit PRBC overnight.  - Repeat BMP this afternoon and in  AM  CAD - Tele - Holding home b-blocker, norvasc, minoxidil, demadex  DM2 - SSI  Constipation - Miralax  Best practice:  Diet: tube feeding per protocol Pain/Anxiety/Delirium protocol (if indicated): as above VAP protocol (if indicated): yes DVT prophylaxis: sub q heparin per protocol GI prophylaxis: famotidine  Glucose control: SSI Mobility: bed rest Code Status: Full Family Communication: Daughter and spouse updated. They have considered options as discussed previously and would like Mr Sweetin to remain full code.  Disposition: remain in ICU  Labs   CBC: Recent Labs  Lab 04/27/19 1300  04/29/19 0402  04/30/19 0443 05/01/19 0221 05/01/19 1501 05/02/19 0527 05/03/19 0354  WBC 15.8*   < > 12.6*  --  11.0* 8.9  --  7.8 6.0  NEUTROABS 10.3*  --   --   --   --   --   --  5.9 4.0  HGB 9.9*   < > 8.9*   < > 8.4* 7.7* 9.9* 6.8* 5.8*  HCT 31.8*   < > 26.8*   < > 25.1* 23.5* 29.0* 20.1* 17.7*  MCV 88.3   < > 83.5  --  82.6 83.0  --  82.0 84.3  PLT 266   < > 168  --  142* 131*  --  131* 110*   < > = values in this interval not displayed.    Basic Metabolic Panel: Recent Labs  Lab 04/27/19 1300  04/27/19 1659  04/28/19 0622  04/29/19 0402  04/30/19 0443 05/01/19 0221 05/01/19 1501 05/02/19 0527 05/03/19 0354  NA 138   < >  --    < > 139   < > 137   < > 138 136 134* 140 136  K 5.6*   < >  --    < > 6.1*   < > 5.3*   < > 5.1 5.9* 6.1* 5.3* 5.9*  CL 96*   < >  --   --  94*  --  96*  --  97* 95*  --  97* 96*  CO2 22   --   --   --  26  --  26  --  27 25  --  28 25  GLUCOSE 237*   < >  --   --  112*  --  158*  --  139* 134*  --  146* 119*  BUN 94*   < >  --   --  108*  --  68*  --  59* 90*  --  61* 100*  CREATININE 14.70*   < >  --   --  16.39*  --  12.27*  --  9.99* 11.43*  --  8.37* 10.70*  CALCIUM 8.3*  --   --   --  7.9*  --  7.9*  --  8.1* 8.2*  --  8.5* 9.0  MG 2.8*  --  2.5*  --  2.4  --  2.2  --   --   --   --   --   --   PHOS 7.9*  --   --   --   --   --  7.5*  --  6.9*  --   --   --  7.5*   < > = values in this interval not displayed.   GFR: Estimated Creatinine Clearance: 10.2 mL/min (A) (by C-G formula based on SCr of 10.7 mg/dL (H)). Recent Labs  Lab 04/27/19 1307 04/27/19 1659 04/27/19 1842  04/30/19 0443 05/01/19 0221 05/02/19 0527 05/03/19 0354  PROCALCITON  --  1.30  --   --   --   --   --   --   WBC  --  15.9*  --    < > 11.0* 8.9 7.8 6.0  LATICACIDVEN 8.7* 1.7 2.3*  --   --   --   --   --    < > = values in this interval not displayed.    Liver Function Tests: Recent Labs  Lab 04/27/19 1300 04/30/19 0443  AST 59*  --   ALT 120*  --   ALKPHOS 74  --   BILITOT 1.1  --   PROT 7.1  --   ALBUMIN 3.6 2.7*   No results for input(s): LIPASE, AMYLASE in the last 168 hours. Recent Labs  Lab 04/27/19 1659  AMMONIA 50*    ABG    Component Value Date/Time   PHART 7.061 (LL) 05/01/2019 1501   PCO2ART 78.0 (HH) 05/01/2019 1501   PO2ART 42.0 (L) 05/01/2019 1501   HCO3 22.1 05/01/2019 1501   TCO2 24 05/01/2019 1501   ACIDBASEDEF 9.0 (H) 05/01/2019 1501   O2SAT 54.0 05/01/2019 1501     Coagulation Profile: Recent Labs  Lab 04/27/19 1419  INR 1.1    Cardiac Enzymes: No results for input(s): CKTOTAL, CKMB, CKMBINDEX, TROPONINI in the last 168 hours.  HbA1C: Hemoglobin-A1c  Date/Time Value Ref Range Status  09/14/2009 09:43 AM 8.5 (H) 5.4 - 7.4 % Final   Hemoglobin A1C  Date/Time Value Ref Range Status  11/21/2016 02:33 PM 5.3  Final  04/02/2016 10:05 AM 6.1   Final   HbA1c, POC (controlled diabetic range)  Date/Time Value Ref Range Status  10/21/2018 11:25 AM 5.7 0.0 - 7.0 % Final   Hgb A1c MFr Bld  Date/Time Value Ref Range Status  04/14/2019 02:00 AM 5.8 (H) 4.8 - 5.6 % Final    Comment:    (NOTE) Pre diabetes:          5.7%-6.4% Diabetes:              >6.4% Glycemic control for   <7.0% adults with diabetes     CBG: Recent Labs  Lab 05/02/19 1609 05/02/19 1958 05/02/19 2356 05/03/19 0350 05/03/19 0827  GLUCAP 144* 116* 86 107* 68*     Critical care time: 33 minutes      Roselie Awkward, MD [place] [facility] Pager: [phone] Cell: [phone] If no response, call [phone]

## 2019-05-03 NOTE — Progress Notes (Signed)
Assisted tele visit to patient with 2 family members.  Maryelizabeth Rowan, RN

## 2019-05-03 NOTE — Progress Notes (Signed)
[facility] Progress Note  Subjective: Febrile overnight- currently off pressors - due for HD today-  Also with hgb of 5.8- still on vent  Vitals:   05/03/19 0400 05/03/19 0500 05/03/19 0600 05/03/19 0700  BP: 125/65 111/60 117/61 (!) 135/55  Pulse: 83 87 83   Resp: (!) 30 (!) 30 (!) 30 (!) 26  Temp: 99.1 F (37.3 C)     TempSrc: Oral     SpO2: 100% 100% 100%   Weight:      Height:        Inpatient medications: . sodium chloride   Intravenous Once  . artificial tears  1 application Both Eyes O2U  . aspirin  81 mg Per Tube Daily  . atorvastatin  80 mg Per Tube Daily  . chlorhexidine gluconate (MEDLINE KIT)  15 mL Mouth Rinse BID  . Chlorhexidine Gluconate Cloth  6 each Topical Q0600  . docusate  100 mg Per Tube BID  . famotidine  10.4 mg Per Tube Q0600  . feeding supplement (PRO-STAT SUGAR FREE 64)  30 mL Per Tube QID  . heparin injection (subcutaneous)  10,000 Units Subcutaneous Q8H  . insulin aspart  2-6 Units Subcutaneous Q4H  . mouth rinse  15 mL Mouth Rinse 10 times per day  . polyethylene glycol  17 g Per Tube Daily   . feeding supplement (VITAL 1.5 CAL) 1,000 mL (05/02/19 0533)  . fentaNYL infusion INTRAVENOUS 200 mcg/hr (05/03/19 0500)  . levETIRAcetam Stopped (05/02/19 2254)  . midazolam 10 mg/hr (05/03/19 0644)  . phenylephrine (NEO-SYNEPHRINE) Adult infusion Stopped (05/02/19 1257)   acetaminophen, albuterol, cisatracurium (NIMBEX) injection, fentaNYL, fentaNYL (SUBLIMAZE) injection, heparin, hydrALAZINE, midazolam    Exam: Patient not examined today directly given COVID-19 + status, utilizing exam of the primary team and observations of RN's.     Home meds:  - amlodipine 10/ carvedilol 25 bid/ minoxidil 20 bid  - linagliptin 5/ glipizide 5   - gabapentin 300 hs  - aspirin 81/ atrovastatin 80  - allopurinol 100 bid  - auryxia / prn's/ vitamins/ supplements     Outpt HD: TTS  -  [facility]   4.5h  F250  124.5kg  400/800  2/2.25   Hep 12,000  TDC  - mircera 30 q4 last 10/13  - hect 4   CXR 11/14 - no edema   Assessment/ Plan: 1. Resp failure/COVID+ / pulm infiltrates - recent COVID pna dc'd 7d ago now worsened. Resp distress / hypoxemia w/ new pulm infiltrates and fever, suspected flash pulm edema initially. Got HD 11/14 with 2.5 L off w/ pressor support and CXR better but still high O2 req. Plan  HD today, then to tomorrow on schedule, lower vol more as tol 2. ESRD on HD TTS via TDC. As above-  Extra treatment today  3. HTN/ volume - as above, HD today for extra tx and lower as able  4. SP PEA arrest 5. DM2 - per primary 6. Recent COVID pna - dc'd on 11/3- then worsened- no specific meds for this right now  7. Anemia ckd - darbe here 60 ug w/ HD q thurs-  Needs more as well as transfusion today for hgb 5.8 8. Hyperkalemia-  HD today and tomorrow    Troy Wells  05/03/2019, 7:30 AM  Iron/TIBC/Ferritin/ %Sat No results found for: IRON, TIBC, FERRITIN, IRONPCTSAT Recent Labs  Lab 04/27/19 1419  04/30/19 0443  05/03/19 0354  NA  --    < > 138   < >  136  K  --    < > 5.1   < > 5.9*  CL  --    < > 97*   < > 96*  CO2  --    < > 27   < > 25  GLUCOSE  --    < > 139*   < > 119*  BUN  --    < > 59*   < > 100*  CREATININE  --    < > 9.99*   < > 10.70*  CALCIUM  --    < > 8.1*   < > 9.0  PHOS  --    < > 6.9*  --  7.5*  ALBUMIN  --   --  2.7*  --   --   INR 1.1  --   --   --   --    < > = values in this interval not displayed.   Recent Labs  Lab 04/27/19 1300  AST 59*  ALT 120*  ALKPHOS 74  BILITOT 1.1  PROT 7.1   Recent Labs  Lab 05/03/19 0354  WBC 6.0  HGB 5.8*  HCT 17.7*  PLT 110*

## 2019-05-03 NOTE — Progress Notes (Signed)
[facility] Progress Note Patient Name: Troy Wells DOB: 1965/07/22 MRN: [id number]   Date of Service  05/03/2019  HPI/Events of Note  Hg 5.8. ESRD. Discussed with bed side RN. No bleeding. On 60% fio2.   eICU Interventions  1 PRBC slowly . Watch for hypoxemia. Fluid overload.  Nephrology to follow Up for HD.      Intervention Category Intermediate Interventions: Other:  Elmer Sow 05/03/2019, 6:01 AM

## 2019-05-04 DIAGNOSIS — J9601 Acute respiratory failure with hypoxia: Secondary | ICD-10-CM | POA: Diagnosis not present

## 2019-05-04 LAB — BASIC METABOLIC PANEL
Anion gap: 15 (ref 5–15)
BUN: 77 mg/dL — ABNORMAL HIGH (ref 6–20)
CO2: 26 mmol/L (ref 22–32)
Calcium: 9.1 mg/dL (ref 8.9–10.3)
Chloride: 97 mmol/L — ABNORMAL LOW (ref 98–111)
Creatinine, Ser: 8.18 mg/dL — ABNORMAL HIGH (ref 0.61–1.24)
GFR calc Af Amer: 8 mL/min — ABNORMAL LOW (ref >60)
GFR calc non Af Amer: 7 mL/min — ABNORMAL LOW (ref >60)
Glucose, Bld: 111 mg/dL — ABNORMAL HIGH (ref 70–99)
Potassium: 5.3 mmol/L — ABNORMAL HIGH (ref 3.5–5.1)
Sodium: 138 mmol/L (ref 135–145)

## 2019-05-04 LAB — CBC
HCT: 21.2 % — ABNORMAL LOW (ref 39.0–52.0)
Hemoglobin: 7 g/dL — ABNORMAL LOW (ref 13.0–17.0)
MCH: 28.2 pg (ref 26.0–34.0)
MCHC: 33 g/dL (ref 30.0–36.0)
MCV: 85.5 fL (ref 80.0–100.0)
Platelets: 132 10*3/uL — ABNORMAL LOW (ref 150–400)
RBC: 2.48 MIL/uL — ABNORMAL LOW (ref 4.22–5.81)
RDW: 16.9 % — ABNORMAL HIGH (ref 11.5–15.5)
WBC: 7.1 10*3/uL (ref 4.0–10.5)
nRBC: 0 % (ref 0.0–0.2)

## 2019-05-04 LAB — MAGNESIUM: Magnesium: 2.4 mg/dL (ref 1.7–2.4)

## 2019-05-04 LAB — GLUCOSE, CAPILLARY
Glucose-Capillary: 102 mg/dL — ABNORMAL HIGH (ref 70–99)
Glucose-Capillary: 149 mg/dL — ABNORMAL HIGH (ref 70–99)
Glucose-Capillary: 133 mg/dL — ABNORMAL HIGH (ref 70–99)
Glucose-Capillary: 127 mg/dL — ABNORMAL HIGH (ref 70–99)
Glucose-Capillary: 152 mg/dL — ABNORMAL HIGH (ref 70–99)
Glucose-Capillary: 137 mg/dL — ABNORMAL HIGH (ref 70–99)

## 2019-05-04 LAB — PHOSPHORUS: Phosphorus: 6.5 mg/dL — ABNORMAL HIGH (ref 2.5–4.6)

## 2019-05-04 MED ORDER — DARBEPOETIN ALFA 200 MCG/0.4ML IJ SOSY
PREFILLED_SYRINGE | INTRAMUSCULAR | Status: AC
  Filled 2019-05-04: qty 0.4

## 2019-05-04 MED ORDER — ALBUMIN HUMAN 25 % IV SOLN
INTRAVENOUS | Status: AC
  Filled 2019-05-04: qty 300

## 2019-05-04 MED ORDER — ALBUMIN HUMAN 25 % IV SOLN: 25.0000 g | INTRAVENOUS | Status: DC | PRN

## 2019-05-04 NOTE — Progress Notes (Signed)
NAME:  Troy Wells, MRN:  [id number], DOB:  Jan 13, 1966, LOS: 7 ADMISSION DATE:  04/27/2019, CONSULTATION DATE:  11/10 REFERRING MD:  Johnney Killian, CHIEF COMPLAINT:  Cardiac arrest   Brief History   53 y/o male with ESRD and COVID 19 had a cardiac arrest at dialysis, 12 minutes.   Past Medical History  ESRD Chronic systolic heart failure CAD HTN OSA DM2 HLD  Significant Hospital Events   11/10 Admitted 11/11 Myoclonic activity, HD with 2.2L removed 11/12 Fentanyl 100 mcg's.  Any stimulation, he has abnormal motor movements, no follow commands.   11/13 Fentanyl 100 mcg's, doppler negative, heparin gtt stopped 11/14 PM had severe hypoxemia, flash edema, emergent bronchoscopy frothy secretions 11/16 MRI concerning for anoxic injury  Consults:  PCCM  Procedures:  ETT 11/10 >> R IJ CVL 11/14 >   Significant Diagnostic Tests:  CXR 11/10 > Bilateral airspace disease suggestive of volume overload ECHO 11/10 >> LVEF 40-45%, mild LVH, basal to mid anterolateral severe hypokinesis, grade II diastolic dysfunction, global RV function normal, LA/RA mildly dilated LE Venous Duplex 11/11 >>  CT Head 11/12 >> no acute abnormality, old R cerebellar infarct 11/13 EEG > severe diffuse encephalopathy, non-specific in etiology 11/14 bronch> frothy bloody secretions MRI brain 11/15 > Heterogeneous signal in the bilateral medial thalami is suspicious for anoxic injury in this clinical setting. Artifact or remote thalamic insults are felt less likely. No other acute intracranial abnormality identified. Chronic right PICA cerebellar infarct.  Micro Data:  SARS COV 2 10/28 > positive BCx2 11/10 >> negative  Antimicrobials:    Interim history/subjective:  More awake and following commands today.   Objective   Blood pressure (!) 152/77, pulse 100, temperature 100 F (37.8 C), temperature source Oral, resp. rate (!) 22, height 5\' 10"  (1.778 m), weight 121.4 kg, SpO2 95 %. CVP:  [4 mmHg-77  mmHg] 4 mmHg  Vent Mode: PRVC FiO2 (%):  [40 %-50 %] 40 % Set Rate:  [30 bmp] 30 bmp Vt Set:  [430 mL] 430 mL PEEP:  [8 cmH20] 8 cmH20 Plateau Pressure:  [20 cmH20-25 cmH20] 21 cmH20   Intake/Output Summary (Last 24 hours) at 05/04/2019 1046 Last data filed at 05/04/2019 0900 Gross per 24 hour  Intake 2031.13 ml  Output 2000 ml  Net 31.13 ml   Filed Weights   05/03/19 1315 05/03/19 1630 05/04/19 0449  Weight: 121 kg 119.4 kg 121.4 kg    Examination: General:  Middle aged male on vent in NAD Neuro:  Eyes open spontaneously. Following commands with upper and lower extremities. Tongue out to command HEENT:  [place]/AT, No JVD noted, PERRL Cardiovascular:  RRR, no MRG Lungs:  Clearing. Normal effort on SBT Abdomen:  Soft, non-distended, normoactive Musculoskeletal:  No acute deformity Skin:  Grossly intact   Resolved Hospital Problem list     Assessment & Plan:  Severe acute respiratory failure with hypoxemia: recent COVID 19 pneumonia so of course concern for this as the etiology, however, acute/flash pulmonary edema seems to be bigger picture for now. Oxygenation continues to improve, presumably as a result of volume removal/HD  - Continue mechanical ventilation per ARDS protocol - Target TVol 6-8cc/kgIBW - Target Plateau Pressure < 30cm H20 - Target driving pressure less than 15 cm of water - Target PaO2 55-65: titrate PEEP/FiO2 per protocol - SBT as tolerated. Tol 5/5 this morning.  ESRD:  Hyperkalemia - HD again today 11/17 - Monitor BMET and UOP - Replace electrolytes as needed  Need for sedation for  mechanical ventilation: high needs with vent dyssynchrony on 11/14 - RASS goal 0 - Sedation off. Consider precedex to facilitate vent wean if gtt needed.   PEA arrest on admission> due to respiratory failure - [place] with family 26/83 > 41/96 elicits FULL CODE and full scope of treatment.   Concern for anoxic brain injury: waking up 11/17 - Keppra to continue -  Neurology to follow  Anemia: likely in ESRD, no obvious bleeding. Stool non-suspicious. 1 unit PRBC given 11/16  - Follow H&H  CAD - Tele - Holding home b-blocker, norvasc, minoxidil, demadex  DM2 - SSI  Constipation - Miralax  Best practice:  Diet: tube feeding per protocol Pain/Anxiety/Delirium protocol (if indicated): as above VAP protocol (if indicated): yes DVT prophylaxis: sub q heparin per protocol GI prophylaxis: famotidine  Glucose control: SSI Mobility: bed rest Code Status: Full Family Communication: Daughter and other updated via phone 11/17 Disposition: remain in ICU  Labs   CBC: Recent Labs  Lab 04/27/19 1300  04/30/19 0443 05/01/19 0221  05/02/19 0527 05/03/19 0354 05/03/19 1408 05/03/19 1656 05/04/19 0500  WBC 15.8*   < > 11.0* 8.9  --  7.8 6.0  --   --  7.1  NEUTROABS 10.3*  --   --   --   --  5.9 4.0  --   --   --   HGB 9.9*   < > 8.4* 7.7*   < > 6.8* 5.8* 8.2* 7.4* 7.0*  HCT 31.8*   < > 25.1* 23.5*   < > 20.1* 17.7* 24.0* 21.6* 21.2*  MCV 88.3   < > 82.6 83.0  --  82.0 84.3  --   --  85.5  PLT 266   < > 142* 131*  --  131* 110*  --   --  132*   < > = values in this interval not displayed.    Basic Metabolic Panel: Recent Labs  Lab 04/27/19 1300  04/27/19 1659  04/28/19 0622  04/29/19 0402  04/30/19 0443 05/01/19 0221 05/01/19 1501 05/02/19 0527 05/03/19 0354 05/03/19 1408 05/04/19 0500  NA 138   < >  --    < > 139   < > 137   < > 138 136 134* 140 136 138 138  K 5.6*   < >  --    < > 6.1*   < > 5.3*   < > 5.1 5.9* 6.1* 5.3* 5.9* 4.8 5.3*  CL 96*   < >  --   --  94*  --  96*  --  97* 95*  --  97* 96*  --  97*  CO2 22  --   --   --  26  --  26  --  27 25  --  28 25  --  26  GLUCOSE 237*   < >  --   --  112*  --  158*  --  139* 134*  --  146* 119*  --  111*  BUN 94*   < >  --   --  108*  --  68*  --  59* 90*  --  61* 100*  --  77*  CREATININE 14.70*   < >  --   --  16.39*  --  12.27*  --  9.99* 11.43*  --  8.37* 10.70*  --  8.18*   CALCIUM 8.3*  --   --   --  7.9*  --  7.9*  --  8.1* 8.2*  --  8.5* 9.0  --  9.1  MG 2.8*  --  2.5*  --  2.4  --  2.2  --   --   --   --   --   --   --  2.4  PHOS 7.9*  --   --   --   --   --  7.5*  --  6.9*  --   --   --  7.5*  --  6.5*   < > = values in this interval not displayed.   GFR: Estimated Creatinine Clearance: 13.5 mL/min (A) (by C-G formula based on SCr of 8.18 mg/dL (H)). Recent Labs  Lab 04/27/19 1307 04/27/19 1659 04/27/19 1842  05/01/19 0221 05/02/19 0527 05/03/19 0354 05/04/19 0500  PROCALCITON  --  1.30  --   --   --   --   --   --   WBC  --  15.9*  --    < > 8.9 7.8 6.0 7.1  LATICACIDVEN 8.7* 1.7 2.3*  --   --   --   --   --    < > = values in this interval not displayed.    Liver Function Tests: Recent Labs  Lab 04/27/19 1300 04/30/19 0443  AST 59*  --   ALT 120*  --   ALKPHOS 74  --   BILITOT 1.1  --   PROT 7.1  --   ALBUMIN 3.6 2.7*   No results for input(s): LIPASE, AMYLASE in the last 168 hours. Recent Labs  Lab 04/27/19 1659  AMMONIA 50*    ABG    Component Value Date/Time   PHART 7.406 05/03/2019 1408   PCO2ART 45.0 05/03/2019 1408   PO2ART 115.0 (H) 05/03/2019 1408   HCO3 28.0 05/03/2019 1408   TCO2 29 05/03/2019 1408   ACIDBASEDEF 9.0 (H) 05/01/2019 1501   O2SAT 98.0 05/03/2019 1408     Coagulation Profile: Recent Labs  Lab 04/27/19 1419  INR 1.1    Cardiac Enzymes: No results for input(s): CKTOTAL, CKMB, CKMBINDEX, TROPONINI in the last 168 hours.  HbA1C: Hemoglobin-A1c  Date/Time Value Ref Range Status  09/14/2009 09:43 AM 8.5 (H) 5.4 - 7.4 % Final   Hemoglobin A1C  Date/Time Value Ref Range Status  11/21/2016 02:33 PM 5.3  Final  04/02/2016 10:05 AM 6.1  Final   HbA1c, POC (controlled diabetic range)  Date/Time Value Ref Range Status  10/21/2018 11:25 AM 5.7 0.0 - 7.0 % Final   Hgb A1c MFr Bld  Date/Time Value Ref Range Status  04/14/2019 02:00 AM 5.8 (H) 4.8 - 5.6 % Final    Comment:    (NOTE) Pre  diabetes:          5.7%-6.4% Diabetes:              >6.4% Glycemic control for   <7.0% adults with diabetes     CBG: Recent Labs  Lab 05/03/19 1516 05/03/19 2008 05/03/19 2302 05/04/19 0447 05/04/19 0729  GLUCAP 84 105* 88 102* 149*     Critical care time: 45 minutes     Georgann Housekeeper, AGACNP-BC [place] for personal pager PCCM on call pager [phone]  05/04/2019 10:57 AM

## 2019-05-04 NOTE — Progress Notes (Signed)
Assisted tele visit to patient with family member.  Lexine Jaspers P, RN  

## 2019-05-04 NOTE — Progress Notes (Signed)
RT NOTE: RT changed patient RR from 30 to 14 per NP order. RT will continue to monitor.

## 2019-05-04 NOTE — Progress Notes (Signed)
Subjective:  Remains off pressors- still febrile - HD yest afternoon-  Removed 2000-  Due for HD again today   Objective Vital signs in last 24 hours: Vitals:   05/04/19 0405 05/04/19 0449 05/04/19 0500 05/04/19 0600  BP:   (!) 142/65 139/64  Pulse:   84 86  Resp:   (!) 30 (!) 30  Temp:      TempSrc:      SpO2: 97%  98% 98%  Weight:  121.4 kg    Height:       Weight change: 1.6 kg  Intake/Output Summary (Last 24 hours) at 05/04/2019 0754 Last data filed at 05/04/2019 0600 Gross per 24 hour  Intake 2340.89 ml  Output 2000 ml  Net 340.89 ml    Outpt HD:TTS -  [facility] 4.5h F250 124.5kg 400/800 2/2.25 Hep 12,000 TDC - mircera 30 q4 last 10/13 - hect 4   Assessment/ Plan: Pt is a 53 y.o. yo male who was admitted on 04/27/2019 with resp failure due to COVID/pulm edema  Assessment/Plan: 1. Resp failure - remains on vent-  FIO2 is being weaned -  Per CCM 2. ESRD - normally TTS as OP via TDC.   Planning for HD on schedule today and hope to continue but seems pretty catabolic  3. Anemia- hgb dropping- s/p transfusion yesterday-  Increased his ESA- cont supportive care  4. Secondary hyperparathyroidism- normally on hect 4-- has not been given here-  Calcium corrected is high- on renvela for his phos  5. HTN/volume- appearing to be volume overloaded - challenge as able - BP should be able to tolerate   Troy Wells    Labs: Basic Metabolic Panel: Recent Labs  Lab 04/30/19 0443  05/02/19 0527 05/03/19 0354 05/03/19 1408 05/04/19 0500  NA 138   < > 140 136 138 138  K 5.1   < > 5.3* 5.9* 4.8 5.3*  CL 97*   < > 97* 96*  --  97*  CO2 27   < > 28 25  --  26  GLUCOSE 139*   < > 146* 119*  --  111*  BUN 59*   < > 61* 100*  --  77*  CREATININE 9.99*   < > 8.37* 10.70*  --  8.18*  CALCIUM 8.1*   < > 8.5* 9.0  --  9.1  PHOS 6.9*  --   --  7.5*  --  6.5*   < > = values in this interval not displayed.   Liver Function Tests: Recent Labs  Lab  04/27/19 1300 04/30/19 0443  AST 59*  --   ALT 120*  --   ALKPHOS 74  --   BILITOT 1.1  --   PROT 7.1  --   ALBUMIN 3.6 2.7*   No results for input(s): LIPASE, AMYLASE in the last 168 hours. Recent Labs  Lab 04/27/19 1659  AMMONIA 50*   CBC: Recent Labs  Lab 04/27/19 1300  04/30/19 0443 05/01/19 0221  05/02/19 0527 05/03/19 0354 05/03/19 1408 05/03/19 1656 05/04/19 0500  WBC 15.8*   < > 11.0* 8.9  --  7.8 6.0  --   --  7.1  NEUTROABS 10.3*  --   --   --   --  5.9 4.0  --   --   --   HGB 9.9*   < > 8.4* 7.7*   < > 6.8* 5.8* 8.2* 7.4* 7.0*  HCT 31.8*   < > 25.1* 23.5*   < >  20.1* 17.7* 24.0* 21.6* 21.2*  MCV 88.3   < > 82.6 83.0  --  82.0 84.3  --   --  85.5  PLT 266   < > 142* 131*  --  131* 110*  --   --  132*   < > = values in this interval not displayed.   Cardiac Enzymes: No results for input(s): CKTOTAL, CKMB, CKMBINDEX, TROPONINI in the last 168 hours. CBG: Recent Labs  Lab 05/03/19 1516 05/03/19 2008 05/03/19 2302 05/04/19 0447 05/04/19 0729  GLUCAP 84 105* 88 102* 149*    Iron Studies: No results for input(s): IRON, TIBC, TRANSFERRIN, FERRITIN in the last 72 hours. Studies/Results: Dg Abd 1 View  Result Date: 05/02/2019 CLINICAL DATA:  NG tube placement EXAM: ABDOMEN - 1 VIEW COMPARISON:  None. FINDINGS: NG tube tip is in the stomach.  Nonobstructive bowel gas pattern. IMPRESSION: NG tube tip in the stomach. Electronically Signed   By: Rolm Baptise M.D.   On: 05/02/2019 23:23   Mr Brain Wo Contrast  Result Date: 05/02/2019 CLINICAL DATA:  53 year old male with COVID-19 status post cardiac arrest at dialysis. Anoxic brain damage. EXAM: MRI HEAD WITHOUT CONTRAST TECHNIQUE: Multiplanar, multiecho pulse sequences of the brain and surrounding structures were obtained without intravenous contrast. COMPARISON:  Head CT 04/29/2019. FINDINGS: Brain: 3 T exam. Heterogeneous DWI, but without evidence of diffuse cortical signal abnormality on T2 or FLAIR imaging.  However, there is hyperintensity in the bilateral medial thalami on DWI (greater on the right series 2, image 28), T2 and FLAIR imaging (symmetric series 7, image 15). However, its unclear how much this area might be restricted (ADC appears fairly isointense series 250, image 28). No associated hemorrhage or mass effect. The bilateral basal ganglia and brainstem appear to remain normal. Chronic right PICA cerebellar infarct again noted. No other convincing acute diffusion abnormality. Occasional scattered nonspecific cerebral white matter T2 and FLAIR hyperintense foci. No other chronic encephalomalacia. No chronic cerebral blood products identified. No midline shift, mass effect, evidence of mass lesion, ventriculomegaly, extra-axial collection or acute intracranial hemorrhage. Cervicomedullary junction and pituitary are within normal limits. Vascular: Major intracranial vascular flow voids are preserved. The left vertebral artery appears dominant. Skull and upper cervical spine: Negative visible cervical spine. Visualized bone marrow signal is within normal limits. Sinuses/Orbits: Negative orbits. Generalized paranasal sinus mucosal thickening. Left nasoenteric tube in place. Intubated. Other: Bilateral mastoid effusions. Mild right forehead scalp soft tissue swelling or edema on series 5, image 15. Other scalp and face soft tissues appear negative. IMPRESSION: 1. Heterogeneous signal in the bilateral medial thalami is suspicious for anoxic injury in this clinical setting. Artifact or remote thalamic insults are felt less likely. 2. No other acute intracranial abnormality identified. Chronic right PICA cerebellar infarct. 3. Intubated with left nasoenteric tube in place. Diffuse paranasal sinus and mastoid inflammation. Electronically Signed   By: Genevie Ann M.D.   On: 05/02/2019 19:33   Medications: Infusions: . albumin human    . albumin human    . feeding supplement (VITAL 1.5 CAL) 1,000 mL (05/03/19 2257)   . fentaNYL infusion INTRAVENOUS 100 mcg/hr (05/04/19 0600)  . levETIRAcetam Stopped (05/03/19 2306)  . midazolam Stopped (05/03/19 0920)  . phenylephrine (NEO-SYNEPHRINE) Adult infusion Stopped (05/02/19 1257)    Scheduled Medications: . sodium chloride   Intravenous Once  . alteplase  2 mg Intracatheter Once  . artificial tears  1 application Both Eyes M6Q  . aspirin  81 mg Per Tube Daily  .  atorvastatin  80 mg Per Tube Daily  . chlorhexidine gluconate (MEDLINE KIT)  15 mL Mouth Rinse BID  . Chlorhexidine Gluconate Cloth  6 each Topical Q0600  . Chlorhexidine Gluconate Cloth  6 each Topical Q0600  . Darbepoetin Alfa      . darbepoetin (ARANESP) injection - DIALYSIS  200 mcg Intravenous Q Tue-HD  . docusate  100 mg Per Tube BID  . famotidine  10.4 mg Per Tube Q0600  . feeding supplement (PRO-STAT SUGAR FREE 64)  30 mL Per Tube QID  . heparin injection (subcutaneous)  10,000 Units Subcutaneous Q8H  . insulin aspart  2-6 Units Subcutaneous Q4H  . mouth rinse  15 mL Mouth Rinse 10 times per day  . polyethylene glycol  17 g Per Tube Daily  . sevelamer carbonate  0.8 g Oral TID    have reviewed scheduled and prn medications.  Physical Exam: Full PE not done-  I am able to visualize patient in room.  Kept in isolation due to COVID positive status to preserve PPE and to limit exposure to providers as able   05/04/2019,7:54 AM  LOS: 7 days

## 2019-05-05 ENCOUNTER — Inpatient Hospital Stay (HOSPITAL_COMMUNITY): Payer: Medicare Other

## 2019-05-05 DIAGNOSIS — J80 Acute respiratory distress syndrome: Secondary | ICD-10-CM | POA: Diagnosis not present

## 2019-05-05 DIAGNOSIS — N186 End stage renal disease: Secondary | ICD-10-CM

## 2019-05-05 DIAGNOSIS — Z992 Dependence on renal dialysis: Secondary | ICD-10-CM | POA: Diagnosis not present

## 2019-05-05 DIAGNOSIS — E875 Hyperkalemia: Secondary | ICD-10-CM | POA: Diagnosis not present

## 2019-05-05 DIAGNOSIS — J9601 Acute respiratory failure with hypoxia: Secondary | ICD-10-CM | POA: Diagnosis not present

## 2019-05-05 DIAGNOSIS — U071 COVID-19: Secondary | ICD-10-CM | POA: Diagnosis not present

## 2019-05-05 DIAGNOSIS — I469 Cardiac arrest, cause unspecified: Secondary | ICD-10-CM | POA: Diagnosis not present

## 2019-05-05 LAB — POCT I-STAT 7, (LYTES, BLD GAS, ICA,H+H)
Acid-Base Excess: 1 mmol/L (ref 0.0–2.0)
Acid-Base Excess: 3 mmol/L — ABNORMAL HIGH (ref 0.0–2.0)
Bicarbonate: 29.7 mmol/L — ABNORMAL HIGH (ref 20.0–28.0)
Bicarbonate: 28.4 mmol/L — ABNORMAL HIGH (ref 20.0–28.0)
Calcium, Ion: 1.33 mmol/L (ref 1.15–1.40)
Calcium, Ion: 1.26 mmol/L (ref 1.15–1.40)
HCT: 22 % — ABNORMAL LOW (ref 39.0–52.0)
HCT: 18 % — ABNORMAL LOW (ref 39.0–52.0)
Hemoglobin: 7.5 g/dL — ABNORMAL LOW (ref 13.0–17.0)
Hemoglobin: 6.1 g/dL — CL (ref 13.0–17.0)
O2 Saturation: 96 %
O2 Saturation: 100 %
Patient temperature: 102.2
Patient temperature: 98.8
Potassium: 6.2 mmol/L — ABNORMAL HIGH (ref 3.5–5.1)
Potassium: 6.1 mmol/L — ABNORMAL HIGH (ref 3.5–5.1)
Sodium: 134 mmol/L — ABNORMAL LOW (ref 135–145)
Sodium: 134 mmol/L — ABNORMAL LOW (ref 135–145)
TCO2: 32 mmol/L (ref 22–32)
TCO2: 30 mmol/L (ref 22–32)
pCO2 arterial: 78.6 mmHg (ref 32.0–48.0)
pCO2 arterial: 51.9 mmHg — ABNORMAL HIGH (ref 32.0–48.0)
pH, Arterial: 7.196 — CL (ref 7.350–7.450)
pH, Arterial: 7.347 — ABNORMAL LOW (ref 7.350–7.450)
pO2, Arterial: 114 mmHg — ABNORMAL HIGH (ref 83.0–108.0)
pO2, Arterial: 277 mmHg — ABNORMAL HIGH (ref 83.0–108.0)

## 2019-05-05 LAB — RENAL FUNCTION PANEL
Albumin: 2.1 g/dL — ABNORMAL LOW (ref 3.5–5.0)
Anion gap: 12 (ref 5–15)
BUN: 78 mg/dL — ABNORMAL HIGH (ref 6–20)
CO2: 24 mmol/L (ref 22–32)
Calcium: 9.3 mg/dL (ref 8.9–10.3)
Chloride: 100 mmol/L (ref 98–111)
Creatinine, Ser: 6.59 mg/dL — ABNORMAL HIGH (ref 0.61–1.24)
GFR calc Af Amer: 10 mL/min — ABNORMAL LOW (ref >60)
GFR calc non Af Amer: 9 mL/min — ABNORMAL LOW (ref >60)
Glucose, Bld: 148 mg/dL — ABNORMAL HIGH (ref 70–99)
Phosphorus: 5.9 mg/dL — ABNORMAL HIGH (ref 2.5–4.6)
Potassium: 5.8 mmol/L — ABNORMAL HIGH (ref 3.5–5.1)
Sodium: 136 mmol/L (ref 135–145)

## 2019-05-05 LAB — DIC (DISSEMINATED INTRAVASCULAR COAGULATION)PANEL
D-Dimer, Quant: 7.06 ug/mL-FEU — ABNORMAL HIGH (ref 0.00–0.50)
Fibrinogen: >800 mg/dL — ABNORMAL HIGH (ref 210–475)
INR: 1.1 (ref 0.8–1.2)
Platelets: 196 10*3/uL (ref 150–400)
Prothrombin Time: 14.5 seconds (ref 11.4–15.2)
Smear Review: NONE SEEN
aPTT: 45 seconds — ABNORMAL HIGH (ref 24–36)

## 2019-05-05 LAB — BASIC METABOLIC PANEL
Anion gap: 15 (ref 5–15)
BUN: 80 mg/dL — ABNORMAL HIGH (ref 6–20)
CO2: 27 mmol/L (ref 22–32)
Calcium: 9.7 mg/dL (ref 8.9–10.3)
Chloride: 96 mmol/L — ABNORMAL LOW (ref 98–111)
Creatinine, Ser: 8.24 mg/dL — ABNORMAL HIGH (ref 0.61–1.24)
GFR calc Af Amer: 8 mL/min — ABNORMAL LOW (ref >60)
GFR calc non Af Amer: 7 mL/min — ABNORMAL LOW (ref >60)
Glucose, Bld: 113 mg/dL — ABNORMAL HIGH (ref 70–99)
Potassium: 5.8 mmol/L — ABNORMAL HIGH (ref 3.5–5.1)
Sodium: 138 mmol/L (ref 135–145)

## 2019-05-05 LAB — CBC
HCT: 23.6 % — ABNORMAL LOW (ref 39.0–52.0)
HCT: 20.9 % — ABNORMAL LOW (ref 39.0–52.0)
Hemoglobin: 7.6 g/dL — ABNORMAL LOW (ref 13.0–17.0)
Hemoglobin: 7.1 g/dL — ABNORMAL LOW (ref 13.0–17.0)
MCH: 28 pg (ref 26.0–34.0)
MCH: 29 pg (ref 26.0–34.0)
MCHC: 32.2 g/dL (ref 30.0–36.0)
MCHC: 34 g/dL (ref 30.0–36.0)
MCV: 87.1 fL (ref 80.0–100.0)
MCV: 85.3 fL (ref 80.0–100.0)
Platelets: 173 10*3/uL (ref 150–400)
Platelets: 198 10*3/uL (ref 150–400)
RBC: 2.71 MIL/uL — ABNORMAL LOW (ref 4.22–5.81)
RBC: 2.45 MIL/uL — ABNORMAL LOW (ref 4.22–5.81)
RDW: 17 % — ABNORMAL HIGH (ref 11.5–15.5)
RDW: 16.8 % — ABNORMAL HIGH (ref 11.5–15.5)
WBC: 5.9 10*3/uL (ref 4.0–10.5)
WBC: 8.7 10*3/uL (ref 4.0–10.5)
nRBC: 0 % (ref 0.0–0.2)
nRBC: 0 % (ref 0.0–0.2)

## 2019-05-05 LAB — GLUCOSE, CAPILLARY
Glucose-Capillary: 110 mg/dL — ABNORMAL HIGH (ref 70–99)
Glucose-Capillary: 156 mg/dL — ABNORMAL HIGH (ref 70–99)
Glucose-Capillary: 152 mg/dL — ABNORMAL HIGH (ref 70–99)
Glucose-Capillary: 129 mg/dL — ABNORMAL HIGH (ref 70–99)
Glucose-Capillary: 162 mg/dL — ABNORMAL HIGH (ref 70–99)
Glucose-Capillary: 124 mg/dL — ABNORMAL HIGH (ref 70–99)

## 2019-05-05 LAB — SARS CORONAVIRUS 2 (TAT 6-24 HRS): SARS Coronavirus 2: POSITIVE — AB

## 2019-05-05 LAB — HEMOGLOBIN AND HEMATOCRIT, BLOOD
HCT: 22.5 % — ABNORMAL LOW (ref 39.0–52.0)
Hemoglobin: 7.5 g/dL — ABNORMAL LOW (ref 13.0–17.0)

## 2019-05-05 LAB — TROPONIN I (HIGH SENSITIVITY)
Troponin I (High Sensitivity): 393 ng/L (ref <18)
Troponin I (High Sensitivity): 363 ng/L (ref <18)

## 2019-05-05 LAB — LACTATE DEHYDROGENASE: LDH: 266 U/L — ABNORMAL HIGH (ref 98–192)

## 2019-05-05 LAB — PHOSPHORUS: Phosphorus: 8.2 mg/dL — ABNORMAL HIGH (ref 2.5–4.6)

## 2019-05-05 LAB — MAGNESIUM: Magnesium: 2.4 mg/dL (ref 1.7–2.4)

## 2019-05-05 MED ORDER — PIPERACILLIN-TAZOBACTAM 3.375 G IVPB 30 MIN
3.3750 g | Freq: Four times a day (QID) | INTRAVENOUS | Status: DC
  Administered 2019-05-05 – 2019-05-07 (×8): 3.375 g via INTRAVENOUS
  Filled 2019-05-05 (×9): qty 50

## 2019-05-05 MED ORDER — PRISMASOL BGK 4/2.5 32-4-2.5 MEQ/L REPLACEMENT SOLN
Status: DC
  Administered 2019-05-05 – 2019-05-11 (×12): via INTRAVENOUS_CENTRAL
  Filled 2019-05-05 (×17): qty 5000

## 2019-05-05 MED ORDER — NOREPINEPHRINE 4 MG/250ML-% IV SOLN
INTRAVENOUS | Status: AC
  Administered 2019-05-05: 5 ug/min via INTRAVENOUS
  Filled 2019-05-05: qty 250

## 2019-05-05 MED ORDER — CISATRACURIUM BESYLATE (PF) 10 MG/5ML IV SOLN
10.0000 mg | Freq: Once | INTRAVENOUS | Status: AC
  Administered 2019-05-05: 10 mg via INTRAVENOUS

## 2019-05-05 MED ORDER — CISATRACURIUM BESYLATE (PF) 10 MG/5ML IV SOLN
10.0000 mg | Freq: Once | INTRAVENOUS | Status: AC
  Administered 2019-05-05: 10 mg via INTRAVENOUS
  Filled 2019-05-05: qty 5

## 2019-05-05 MED ORDER — VANCOMYCIN HCL 10 G IV SOLR
2000.0000 mg | Freq: Once | INTRAVENOUS | Status: AC
  Administered 2019-05-05: 2000 mg via INTRAVENOUS
  Filled 2019-05-05: qty 2000

## 2019-05-05 MED ORDER — HEPARIN SODIUM (PORCINE) 1000 UNIT/ML DIALYSIS
1000.0000 [IU] | INTRAMUSCULAR | Status: DC | PRN
  Administered 2019-05-11: 3200 [IU] via INTRAVENOUS_CENTRAL
  Filled 2019-05-05: qty 6

## 2019-05-05 MED ORDER — VECURONIUM BROMIDE 10 MG IV SOLR
INTRAVENOUS | Status: AC
  Filled 2019-05-05: qty 10

## 2019-05-05 MED ORDER — VITAL 1.5 CAL PO LIQD
1000.0000 mL | ORAL | Status: DC
  Administered 2019-05-05 – 2019-05-09 (×7): 1000 mL
  Filled 2019-05-05 (×8): qty 1000

## 2019-05-05 MED ORDER — VECURONIUM BROMIDE 10 MG IV SOLR
10.0000 mg | Freq: Once | INTRAVENOUS | Status: AC
  Administered 2019-05-05: 10 mg via INTRAVENOUS

## 2019-05-05 MED ORDER — NOREPINEPHRINE 4 MG/250ML-% IV SOLN
0.0000 ug/min | INTRAVENOUS | Status: DC
  Administered 2019-05-05 (×2): 5 ug/min via INTRAVENOUS
  Administered 2019-05-06: 6 ug/min via INTRAVENOUS
  Administered 2019-05-06: 2 ug/min via INTRAVENOUS
  Filled 2019-05-05 (×3): qty 250

## 2019-05-05 MED ORDER — PRISMASOL BGK 4/2.5 32-4-2.5 MEQ/L REPLACEMENT SOLN
Status: DC
  Administered 2019-05-05 – 2019-05-11 (×8): via INTRAVENOUS_CENTRAL
  Filled 2019-05-05 (×12): qty 5000

## 2019-05-05 MED ORDER — HYDROMORPHONE BOLUS VIA INFUSION
0.5000 mg | INTRAVENOUS | Status: DC | PRN
  Filled 2019-05-05: qty 1

## 2019-05-05 MED ORDER — MIDAZOLAM HCL 2 MG/2ML IJ SOLN
1.0000 mg | INTRAMUSCULAR | Status: DC | PRN
  Administered 2019-05-05 (×3): 2 mg via INTRAVENOUS
  Filled 2019-05-05 (×2): qty 2

## 2019-05-05 MED ORDER — VANCOMYCIN HCL 10 G IV SOLR
1250.0000 mg | INTRAVENOUS | Status: DC
  Administered 2019-05-06 – 2019-05-07 (×2): 1250 mg via INTRAVENOUS
  Filled 2019-05-05 (×2): qty 1250

## 2019-05-05 MED ORDER — POLYETHYLENE GLYCOL 3350 17 G PO PACK: 17.0000 g | PACK | Freq: Every day | ORAL | Status: DC | PRN

## 2019-05-05 MED ORDER — MIDAZOLAM HCL 2 MG/2ML IJ SOLN: 2.0000 mg | Freq: Once | INTRAMUSCULAR | Status: AC

## 2019-05-05 MED ORDER — ARTIFICIAL TEARS OPHTHALMIC OINT: 1.0000 "application " | TOPICAL_OINTMENT | Freq: Three times a day (TID) | OPHTHALMIC | Status: DC

## 2019-05-05 MED ORDER — HEPARIN (PORCINE) IN NACL 2-0.9 UNITS/ML
INTRAMUSCULAR | Status: DC | PRN
  Filled 2019-05-05: qty 1000

## 2019-05-05 MED ORDER — ROCURONIUM BROMIDE 10 MG/ML (PF) SYRINGE
PREFILLED_SYRINGE | INTRAVENOUS | Status: AC
  Administered 2019-05-05: 09:00:00
  Filled 2019-05-05: qty 10

## 2019-05-05 MED ORDER — PRISMASOL BGK 4/2.5 32-4-2.5 MEQ/L IV SOLN
INTRAVENOUS | Status: DC
  Administered 2019-05-05 – 2019-05-07 (×16): via INTRAVENOUS_CENTRAL
  Filled 2019-05-05 (×26): qty 5000

## 2019-05-05 MED ORDER — LEVETIRACETAM IN NACL 500 MG/100ML IV SOLN
500.0000 mg | Freq: Two times a day (BID) | INTRAVENOUS | Status: DC
  Administered 2019-05-05 – 2019-05-11 (×13): 500 mg via INTRAVENOUS
  Filled 2019-05-05 (×14): qty 100

## 2019-05-05 MED ORDER — SODIUM CHLORIDE 0.9 % IV SOLN
0.5000 mg/h | INTRAVENOUS | Status: DC
  Administered 2019-05-05: 0.5 mg/h via INTRAVENOUS
  Administered 2019-05-06: 4 mg/h via INTRAVENOUS
  Administered 2019-05-06: 2 mg/h via INTRAVENOUS
  Administered 2019-05-07: 4 mg/h via INTRAVENOUS
  Filled 2019-05-05 (×6): qty 5

## 2019-05-05 MED ORDER — SODIUM CHLORIDE 0.9 % IV SOLN
0.0000 ug/kg/min | INTRAVENOUS | Status: DC
  Administered 2019-05-05: 3 ug/kg/min via INTRAVENOUS
  Administered 2019-05-05: 5 ug/kg/min via INTRAVENOUS
  Administered 2019-05-06 (×4): 5.5 ug/kg/min via INTRAVENOUS
  Administered 2019-05-07: 6 ug/kg/min via INTRAVENOUS
  Administered 2019-05-07: 5.5 ug/kg/min via INTRAVENOUS
  Filled 2019-05-05 (×13): qty 20

## 2019-05-05 NOTE — Progress Notes (Addendum)
[facility] Progress Note Patient Name: Troy Wells DOB: 1965/10/10 MRN: [id number]   Date of Service  05/05/2019  HPI/Events of Note  Pt with twitching movements of his face of unclear etiology, Pt has a history of seizures.  eICU Interventions  Versed 2 mg iv bolus x 1 for possible seizure, EEG        Joangel Vanosdol U Mane Consolo 05/05/2019, 6:43 AM

## 2019-05-05 NOTE — Progress Notes (Signed)
Pt starting to get extremely agitated. Pt desating to the 80's, HR elevated to the 150's, lung sounds very rhonchi, and pt having frothy, pink-tinged/blood-tinged sputum in the ETT tube. 2 mg versed given, Elink MD paged. Another 3 mg bolus of versed given and versed drip started. A total of 20 mg of nimbex given. Pt bagged with O2 improving to the 90s. However, pt still very dyssynchronous with the ventilator. New orders received. Will continue to monitor.

## 2019-05-05 NOTE — Progress Notes (Signed)
Subjective:  Had HD yesterday removed 3500- tolerated well - some type of issue this AM with desats, asynchronous with vent-  Also K of 5.8   Objective Vital signs in last 24 hours: Vitals:   05/05/19 0400 05/05/19 0500 05/05/19 0521 05/05/19 0600  BP: (!) 167/85 (!) 180/101 (!) 180/101 (!) 162/82  Pulse: (!) 110 (!) 123  (!) 114  Resp: (!) 37 (!) 25  (!) 27  Temp: 100 F (37.8 C)     TempSrc: Axillary     SpO2: 95% 98%  96%  Weight:      Height:       Weight change: -0.1 kg  Intake/Output Summary (Last 24 hours) at 05/05/2019 0654 Last data filed at 05/05/2019 0600 Gross per 24 hour  Intake 2226.51 ml  Output 3500 ml  Net -1273.49 ml    Outpt HD:TTS -  [facility] 4.5h F250 124.5kg 400/800 2/2.25 Hep 12,000 AVF - mircera 30 q4 last 10/13 - hect 4   Assessment/ Plan: Pt is a 53 y.o. yo male who was admitted on 04/27/2019 with resp failure due to COVID/pulm edema  Assessment/Plan: 1. Resp failure - remains on vent-  FIO2 was being weaned - but some issues this AM 2. ESRD - normally TTS as OP via TDC. Had reg HD Mon and Tuesday.  BUN higher today as well as K with vent difficulties.  He may benefit from CRRT-  Have inquired about staff and equipment- will start- thought he had a TDC per notes but he does not - he has an AVF-  Will need line-  Will talk to CCM   3. Anemia- hgb dropping- s/p transfusion 11/16-  Increased his ESA- cont supportive care  4. Secondary hyperparathyroidism- normally on hect 4-- has not been given here-  Calcium corrected is high- on renvela for his phos but getting TF so difficult  5. HTN/volume- appearing to be volume overloaded - another reason for CRRT for more effective volume removal - will start with 200 per hour    Louis Meckel    Labs: Basic Metabolic Panel: Recent Labs  Lab 05/03/19 0354 05/03/19 1408 05/04/19 0500 05/05/19 0353  NA 136 138 138 138  K 5.9* 4.8 5.3* 5.8*  CL 96*  --  97* 96*  CO2 25  --  26 27   GLUCOSE 119*  --  111* 113*  BUN 100*  --  77* 80*  CREATININE 10.70*  --  8.18* 8.24*  CALCIUM 9.0  --  9.1 9.7  PHOS 7.5*  --  6.5* 8.2*   Liver Function Tests: Recent Labs  Lab 04/30/19 0443  ALBUMIN 2.7*   No results for input(s): LIPASE, AMYLASE in the last 168 hours. No results for input(s): AMMONIA in the last 168 hours. CBC: Recent Labs  Lab 05/01/19 0221  05/02/19 0527 05/03/19 0354  05/03/19 1656 05/04/19 0500 05/05/19 0353  WBC 8.9  --  7.8 6.0  --   --  7.1 5.9  NEUTROABS  --   --  5.9 4.0  --   --   --   --   HGB 7.7*   < > 6.8* 5.8*   < > 7.4* 7.0* 7.6*  HCT 23.5*   < > 20.1* 17.7*   < > 21.6* 21.2* 23.6*  MCV 83.0  --  82.0 84.3  --   --  85.5 87.1  PLT 131*  --  131* 110*  --   --  132* 173   < > =  values in this interval not displayed.   Cardiac Enzymes: No results for input(s): CKTOTAL, CKMB, CKMBINDEX, TROPONINI in the last 168 hours. CBG: Recent Labs  Lab 05/04/19 1127 05/04/19 1506 05/04/19 2021 05/04/19 2302 05/05/19 0341  GLUCAP 133* 127* 152* 137* 110*    Iron Studies: No results for input(s): IRON, TIBC, TRANSFERRIN, FERRITIN in the last 72 hours. Studies/Results: No results found. Medications: Infusions: . albumin human    . feeding supplement (VITAL 1.5 CAL) 1,000 mL (05/04/19 1716)  . fentaNYL infusion INTRAVENOUS 250 mcg/hr (05/05/19 0600)  . levETIRAcetam Stopped (05/04/19 2336)  . midazolam 3 mg/hr (05/05/19 3795)    Scheduled Medications: . sodium chloride   Intravenous Once  . alteplase  2 mg Intracatheter Once  . artificial tears  1 application Both Eyes L8P  . aspirin  81 mg Per Tube Daily  . atorvastatin  80 mg Per Tube Daily  . chlorhexidine gluconate (MEDLINE KIT)  15 mL Mouth Rinse BID  . Chlorhexidine Gluconate Cloth  6 each Topical Q0600  . Chlorhexidine Gluconate Cloth  6 each Topical Q0600  . darbepoetin (ARANESP) injection - DIALYSIS  200 mcg Intravenous Q Tue-HD  . docusate  100 mg Per Tube BID  .  famotidine  10.4 mg Per Tube Q0600  . feeding supplement (PRO-STAT SUGAR FREE 64)  30 mL Per Tube QID  . heparin injection (subcutaneous)  10,000 Units Subcutaneous Q8H  . insulin aspart  2-6 Units Subcutaneous Q4H  . mouth rinse  15 mL Mouth Rinse 10 times per day  . midazolam  2 mg Intravenous Once  . polyethylene glycol  17 g Per Tube Daily  . sevelamer carbonate  0.8 g Oral TID    have reviewed scheduled and prn medications.  Physical Exam: Full PE not done-  I am able to visualize patient in room.  Kept in isolation due to COVID positive status to preserve PPE and to limit exposure to providers as able   05/05/2019,6:54 AM  LOS: 8 days

## 2019-05-05 NOTE — Progress Notes (Addendum)
Pharmacy Antibiotic Note  Troy Wells is a 53 y.o. male admitted on 04/27/2019 after syncope during HD.  Pt is ESRD currently on CRRT with new fevers, increasing vent dyssynchrony, and CXR concerning for ventilator associated pneumonia. Pharmacy consulted for vancomycin and zosyn dosing.    Plan: Initiate vancomycin 2000mg  IV bolus x1 dose.  Continue with vancomcycin 1250mg  IV every 24 hours while on CRRT.  Initiate Zosyn 3.375g IV every 6 hours while on CRRT.  Monitor for signs of clinical improvement, further renal plans, and prolonged pauses in CRRT.  Height: 5\' 10"  (177.8 cm) Weight: 266 lb 8.6 oz (120.9 kg) IBW/kg (Calculated) : 73  Temp (24hrs), Avg:99.7 F (37.6 C), Min:97.8 F (36.6 C), Max:102.2 F (39 C)  Recent Labs  Lab 05/01/19 0221 05/02/19 0527 05/03/19 0354 05/04/19 0500 05/05/19 0353  WBC 8.9 7.8 6.0 7.1 5.9  CREATININE 11.43* 8.37* 10.70* 8.18* 8.24*    Estimated Creatinine Clearance: 13.4 mL/min (A) (by C-G formula based on SCr of 8.24 mg/dL (H)).    No Known Allergies  Thank you for allowing pharmacy to be a part of this patient's care.  Troy Wells 05/05/2019 1:19 PM

## 2019-05-05 NOTE — Progress Notes (Signed)
[facility] Progress Note Patient Name: Troy Wells DOB: May 20, 1966 MRN: [id number]   Date of Service  05/05/2019  HPI/Events of Note  PT continues to be dyssynchronous and desaturate into the 80's  eICU Interventions  Nimbex infusion started        [employer] 05/05/2019, 7:00 AM

## 2019-05-05 NOTE — Progress Notes (Signed)
EEG completed, results pending. 

## 2019-05-05 NOTE — Procedures (Signed)
Trialysis Catheter Insertion Procedure Note Tirso Laws [id number] [birth year]  Procedure: Insertion of Central Venous Catheter Indications: CRRT  Procedure Details Consent: Risks of procedure as well as the alternatives and risks of each were explained to the (patient/caregiver).  Consent for procedure obtained. Time Out: Verified patient identification, verified procedure, site/side was marked, verified correct patient position, special equipment/implants available, medications/allergies/relevent history reviewed, required imaging and test results available.  Performed  Maximum sterile technique was used including antiseptics, cap, gloves, gown, hand hygiene, mask and sheet. Skin prep: Chlorhexidine; local anesthetic administered A antimicrobial bonded/coated triple lumen catheter was placed in the right femoral vein, no other available access in long tem HD patient using the Seldinger technique.  Evaluation Blood flow good Complications: No apparent complications Patient did tolerate procedure well. Chest X-ray ordered to verify placement.  CXR: NA.   Georgann Housekeeper, AGACNP-BC [place]  See Amion for personal pager PCCM on call pager [phone]  05/05/2019 10:09 AM

## 2019-05-05 NOTE — Progress Notes (Signed)
EKG results noted to Georgann Housekeeper NP

## 2019-05-05 NOTE — Procedures (Signed)
Patient Name: Kean Gautreau  MRN: [id number]  Epilepsy Attending: Lora Havens  Referring Physician/Provider: Dr. Trevor Mace Date: 05/05/2019 Duration: 24.27 mins  Patient history: 53yo m who presented with PEA arrest. EEG to evaluate for seizure  Level of alertness:comatose/sedated  AEDs during EEG study: keppra, versed  Technical aspects: This EEG study was done with scalp electrodes positioned according to the 10-20 International system of electrode placement. Electrical activity was acquired at a sampling rate of 500Hz  and reviewed with a high frequency filter of 70Hz  and a low frequency filter of 1Hz . EEG data were recorded continuously and digitally stored.  DESCRIPTION: EEG showed continuous generalized low voltage 2-5hz  theta-delta slowing. EEG was reactive to tactile suppression.Hyperventilation and photic stimulation were not performed.  ABNORMALITY - Continuous slow, generalized  IMPRESSION: This study issuggestive of profound diffuse encephalopathy, non specifc to etiology, could be secondary to sedation.No seizures or epileptiform discharges were seen throughout the recording.  EEG appears worse than EEG on 04/29/2019 as this there is more low voltage slowing compared to previous EEG which could at times be secondary to sedation.

## 2019-05-05 NOTE — Progress Notes (Signed)
[facility] Progress Note Patient Name: Troy Wells DOB: May 18, 1966 MRN: [id number]   Date of Service  05/05/2019  HPI/Events of Note  Pt remains dyssynchronous despite 10 mg of Nimbex (0.1 mg/kg)  eICU Interventions  An additional 10 mg of Nimbex given (approx. 0.2 mg/kg total dose)        Troy Wells 05/05/2019, 6:34 AM

## 2019-05-05 NOTE — Procedures (Signed)
Arterial Catheter Insertion Procedure Note Brysyn Brandenberger [id number] 02-11-1966  Procedure: Insertion of Arterial Catheter  Indications: Frequent blood sampling  Procedure Details Consent: Risks of procedure as well as the alternatives and risks of each were explained to the (patient/caregiver).  Consent for procedure obtained. Time Out: Verified patient identification, verified procedure, site/side was marked, verified correct patient position, special equipment/implants available, medications/allergies/relevent history reviewed, required imaging and test results available.  Performed  Maximum sterile technique was used including antiseptics, cap, gloves, gown, hand hygiene, mask and sheet. Skin prep: Chlorhexidine; local anesthetic administered 20 gauge catheter was inserted into right radial artery using the Seldinger technique. ULTRASOUND GUIDANCE USED: NO Evaluation Blood flow good; BP tracing good. Complications: No apparent complications.   Georgann Housekeeper, AGACNP-BC [place]  See Amion for personal pager PCCM on call pager [phone]  05/05/2019 10:10 AM

## 2019-05-05 NOTE — Progress Notes (Signed)
Pt unavailable for STAT EEG at this time. Pt undergoing other procedures at this time. Will check back with Nurse when schedule permits

## 2019-05-05 NOTE — Progress Notes (Addendum)
Troponin 3.93 reported to Georgann Housekeeper NP

## 2019-05-05 NOTE — Progress Notes (Signed)
Came to room for pt 8am eval and for ABG ordered.  Per CCM, hold off on obtaining ABG for now d/t plans for a-line to be placed.  RN aware.

## 2019-05-05 NOTE — Progress Notes (Addendum)
NAME:  Troy Wells, MRN:  [id number], DOB:  [birth year], LOS: 8 ADMISSION DATE:  04/27/2019, CONSULTATION DATE:  11/10 REFERRING MD:  Johnney Killian, CHIEF COMPLAINT:  Cardiac arrest   Brief History   53 y/o male with ESRD and COVID 19 had a cardiac arrest at dialysis, 12 minutes.   Past Medical History  ESRD Chronic systolic heart failure CAD HTN OSA DM2 HLD  Significant Hospital Events   11/10 Admitted 11/11 Myoclonic activity, HD with 2.2L removed 11/12 Fentanyl 100 mcg's.  Any stimulation, he has abnormal motor movements, no follow commands.   11/13 Fentanyl 100 mcg's, doppler negative, heparin gtt stopped 11/14 PM had severe hypoxemia, flash edema, emergent bronchoscopy frothy secretions 11/16 MRI concerning for anoxic injury 11/17 waking up. Follows commands. Wean 5/5 for several hours. 11/18 decompensation. Back on sedation and full support. Start CRRT  Consults:  PCCM  Procedures:  ETT 11/10 >> R IJ CVL 11/14 >   Significant Diagnostic Tests:  CXR 11/10 > Bilateral airspace disease suggestive of volume overload ECHO 11/10 >> LVEF 40-45%, mild LVH, basal to mid anterolateral severe hypokinesis, grade II diastolic dysfunction, global RV function normal, LA/RA mildly dilated LE Venous Duplex 11/11 >>  CT Head 11/12 >> no acute abnormality, old R cerebellar infarct 11/13 EEG > severe diffuse encephalopathy, non-specific in etiology 11/14 bronch> frothy bloody secretions MRI brain 11/15 > Heterogeneous signal in the bilateral medial thalami is suspicious for anoxic injury in this clinical setting. Artifact or remote thalamic insults are felt less likely. No other acute intracranial abnormality identified. Chronic right PICA cerebellar infarct.  Micro Data:  SARS COV 2 10/28 > positive BCx2 11/10 >> negative  Antimicrobials:    Interim history/subjective:  Decompensated overnight. Vent setting increased, back on sedation. Considering paralytic. Bloody secretions  from ETT.   Objective   Blood pressure 130/74, pulse (!) 124, temperature (!) 102.2 F (39 C), temperature source Oral, resp. rate (!) 30, height 5\' 10"  (1.778 m), weight 120.9 kg, SpO2 92 %. CVP:  [1 mmHg-6 mmHg] 6 mmHg  Vent Mode: PRVC FiO2 (%):  [40 %-100 %] 100 % Set Rate:  [14 bmp-30 bmp] 25 bmp Vt Set:  [430 mL] 430 mL PEEP:  [8 cmH20-13 cmH20] 13 cmH20 Plateau Pressure:  [18 cmH20-22 cmH20] 19 cmH20   Intake/Output Summary (Last 24 hours) at 05/05/2019 0753 Last data filed at 05/05/2019 0600 Gross per 24 hour  Intake 2226.51 ml  Output 3500 ml  Net -1273.49 ml   Filed Weights   05/03/19 1630 05/04/19 0449 05/05/19 0350  Weight: 119.4 kg 121.4 kg 120.9 kg    Examination:  General:  Middle aged male on vent Neuro:  Sedated RASS -4. Intermittent gagging on tube. BIS 20-60 range HEENT:  [place]/AT, No JVD noted, PERRL Cardiovascular:  RRR, no MRG Lungs:  Clearing. Normal effort on SBT Abdomen:  Soft, non-distended, normoactive Musculoskeletal:  No acute deformity Skin:  Grossly intact   Resolved Hospital Problem list     Assessment & Plan:  Severe acute respiratory failure with hypoxemia: recent COVID 19 pneumonia so of course concern for this as the etiology, however, acute/flash pulmonary edema seems to be bigger picture for now. Oxygenation continues to improve, presumably as a result of volume removal/HD. Now 11/18 he has developed worsening respiratory failure with bloody secretions. Pulmonary edema vs DAH.  Perhaps in the setting of infection.  - Continue mechanical ventilation per ARDS protocol - PRVC 6cc/kg - Target Plateau Pressure < 30cm H20 (~30 on current  settings) - Target driving pressure less than 15 cm of water (16 on current settings) - Target PaO2 55-65: titrate PEEP/FiO2 per protocol - Not a candidate for SBT - Will send DIC panel, INR, LDH, GBM, ANCA, ANA - Send cultures, COVID again - Start empiric antibiotics.   ESRD:  Hyperkalemia - Start CRRT  - Nephrology following - Monitor BMET and UOP - Replace electrolytes as needed  Need for sedation for mechanical ventilation: high needs with vent dyssynchrony - RASS goal -4 to -5. BIS goal 40-60 - Fentanyl and versed infusions - Nimbex for neuromuscular blockade.  - Hopefully can wean nimbex to off 11/19  PEA arrest on admission> due to respiratory failure - [place] with family 14/78 > 29/56 elicits FULL CODE and full scope of treatment.  - Check troponin, EKG - EKG concerning for ST elevation> D/w cardiology who feel this EKG does not represent STEMI. Trop still pending.   Concern for anoxic brain injury: - Was awake and following basic commands on 11/17, now requiring increased sedation 11/18  Anemia: initially ESRD seemed to be the etiology, now with bloody secretions.  - Follow H&H - No need for transfusion this morning  CAD - Tele - Holding home b-blocker, norvasc, minoxidil, demadex  DM2 - SSI  Constipation - Miralax  Best practice:  Diet: tube feeding per protocol Pain/Anxiety/Delirium protocol (if indicated): as above VAP protocol (if indicated): yes DVT prophylaxis: sub q heparin per protocol GI prophylaxis: famotidine  Glucose control: SSI Mobility: bed rest Code Status: Full Family Communication: Daughter and other updated via phone 11/17 Disposition: remain in ICU  Labs   CBC: Recent Labs  Lab 05/01/19 0221  05/02/19 0527 05/03/19 0354 05/03/19 1408 05/03/19 1656 05/04/19 0500 05/05/19 0353  WBC 8.9  --  7.8 6.0  --   --  7.1 5.9  NEUTROABS  --   --  5.9 4.0  --   --   --   --   HGB 7.7*   < > 6.8* 5.8* 8.2* 7.4* 7.0* 7.6*  HCT 23.5*   < > 20.1* 17.7* 24.0* 21.6* 21.2* 23.6*  MCV 83.0  --  82.0 84.3  --   --  85.5 87.1  PLT 131*  --  131* 110*  --   --  132* 173   < > = values in this interval not displayed.    Basic Metabolic Panel: Recent Labs  Lab 04/29/19 0402  04/30/19 0443 05/01/19 0221  05/02/19 0527 05/03/19 0354 05/03/19 1408  05/04/19 0500 05/05/19 0353  NA 137   < > 138 136   < > 140 136 138 138 138  K 5.3*   < > 5.1 5.9*   < > 5.3* 5.9* 4.8 5.3* 5.8*  CL 96*  --  97* 95*  --  97* 96*  --  97* 96*  CO2 26  --  27 25  --  28 25  --  26 27  GLUCOSE 158*  --  139* 134*  --  146* 119*  --  111* 113*  BUN 68*  --  59* 90*  --  61* 100*  --  77* 80*  CREATININE 12.27*  --  9.99* 11.43*  --  8.37* 10.70*  --  8.18* 8.24*  CALCIUM 7.9*  --  8.1* 8.2*  --  8.5* 9.0  --  9.1 9.7  MG 2.2  --   --   --   --   --   --   --  2.4 2.4  PHOS 7.5*  --  6.9*  --   --   --  7.5*  --  6.5* 8.2*   < > = values in this interval not displayed.   GFR: Estimated Creatinine Clearance: 13.4 mL/min (A) (by C-G formula based on SCr of 8.24 mg/dL (H)). Recent Labs  Lab 05/02/19 0527 05/03/19 0354 05/04/19 0500 05/05/19 0353  WBC 7.8 6.0 7.1 5.9    Liver Function Tests: Recent Labs  Lab 04/30/19 0443  ALBUMIN 2.7*   No results for input(s): LIPASE, AMYLASE in the last 168 hours. No results for input(s): AMMONIA in the last 168 hours.  ABG    Component Value Date/Time   PHART 7.406 05/03/2019 1408   PCO2ART 45.0 05/03/2019 1408   PO2ART 115.0 (H) 05/03/2019 1408   HCO3 28.0 05/03/2019 1408   TCO2 29 05/03/2019 1408   ACIDBASEDEF 9.0 (H) 05/01/2019 1501   O2SAT 98.0 05/03/2019 1408     Coagulation Profile: No results for input(s): INR, PROTIME in the last 168 hours.  Cardiac Enzymes: No results for input(s): CKTOTAL, CKMB, CKMBINDEX, TROPONINI in the last 168 hours.  HbA1C: Hemoglobin-A1c  Date/Time Value Ref Range Status  09/14/2009 09:43 AM 8.5 (H) 5.4 - 7.4 % Final   Hemoglobin A1C  Date/Time Value Ref Range Status  11/21/2016 02:33 PM 5.3  Final  04/02/2016 10:05 AM 6.1  Final   HbA1c, POC (controlled diabetic range)  Date/Time Value Ref Range Status  10/21/2018 11:25 AM 5.7 0.0 - 7.0 % Final   Hgb A1c MFr Bld  Date/Time Value Ref Range Status  04/14/2019 02:00 AM 5.8 (H) 4.8 - 5.6 % Final     Comment:    (NOTE) Pre diabetes:          5.7%-6.4% Diabetes:              >6.4% Glycemic control for   <7.0% adults with diabetes     CBG: Recent Labs  Lab 05/04/19 1506 05/04/19 2021 05/04/19 2302 05/05/19 0341 05/05/19 0720  GLUCAP 127* 152* 137* 110* 156*     Critical care time: 60 mins not including procedures     Georgann Housekeeper, AGACNP-BC [place] for personal pager PCCM on call pager [phone]  05/05/2019 7:53 AM

## 2019-05-05 NOTE — Progress Notes (Addendum)
Nutrition Follow-up  DOCUMENTATION CODES:   Obesity unspecified  INTERVENTION:   Continue TF via NGT:   Vital 1.5 increase to 65 ml/h (1560 ml per day)   Pro-stat 30 ml QID   Provides 2740 kcal, 165 gm protein, 1192 ml free water daily  NUTRITION DIAGNOSIS:   Increased nutrient needs related to acute illness(COVID-19) as evidenced by estimated needs.  Ongoing  GOAL:   Patient will meet greater than or equal to 90% of their needs   Met with TF  MONITOR:   Vent status, TF tolerance, Labs  ASSESSMENT:   53 yo male admitted in PEA arrest after a syncopal episode at HD center. Intubated on admission. PMH includes recent COVID PNA (positive on 10/28), HTN, CAD, ESRD on HD, HLD, OSA, DM-2.   Arterial line placed today.  CRRT being initiated for worsening renal function with increased potassium and BUN levels.  NG tube in place. Receiving Vital 1.5 at 60 ml/h with Pro-stat 30 ml QID to provide 2560 kcal, 155 gm protein, 1100 ml free water daily, tolerating well.  Patient is currently intubated on ventilator support MV: 12.9 L/min Temp (24hrs), Avg:99.7 F (37.6 C), Min:97.8 F (36.6 C), Max:102.2 F (39 C)   Labs reviewed. Sodium 134 (L), Potassium 6.1 (H) CBG's: 156-152  Medications reviewed and include levophed, novolog, renvela, nimbex.  I/O +1.6 L since admission Weight on admission 118.6 kg, current weight 120.9 kg  NUTRITION - FOCUSED PHYSICAL EXAM:  unable to complete  Diet Order:   Diet Order            Diet NPO time specified  Diet effective now              EDUCATION NEEDS:   Not appropriate for education at this time  Skin:  Skin Assessment: Skin Integrity Issues: Skin Integrity Issues:: Stage II Stage II: sacrum  Last BM:  11/18  Height:   Ht Readings from Last 1 Encounters:  05/01/19 _0  (1.778 m)    Weight:   Wt Readings from Last 1 Encounters:  05/05/19 120.9 kg   04/28/19 118.1 kg    Ideal Body Weight:  75.5  kg  BMI:  Body mass index is 38.24 kg/m.  Estimated Nutritional Needs:   Kcal:  3875-6433  Protein:  150-180 gm  Fluid:  1 L + UOP    Molli Barrows, RD, LDN, [place] Pager [phone] After Hours Pager [phone]

## 2019-05-06 DIAGNOSIS — Z992 Dependence on renal dialysis: Secondary | ICD-10-CM | POA: Diagnosis not present

## 2019-05-06 DIAGNOSIS — J9601 Acute respiratory failure with hypoxia: Secondary | ICD-10-CM | POA: Diagnosis not present

## 2019-05-06 DIAGNOSIS — U071 COVID-19: Secondary | ICD-10-CM | POA: Diagnosis not present

## 2019-05-06 DIAGNOSIS — N186 End stage renal disease: Secondary | ICD-10-CM | POA: Diagnosis not present

## 2019-05-06 LAB — GLUCOSE, CAPILLARY
Glucose-Capillary: 141 mg/dL — ABNORMAL HIGH (ref 70–99)
Glucose-Capillary: 129 mg/dL — ABNORMAL HIGH (ref 70–99)
Glucose-Capillary: 106 mg/dL — ABNORMAL HIGH (ref 70–99)
Glucose-Capillary: 119 mg/dL — ABNORMAL HIGH (ref 70–99)
Glucose-Capillary: 112 mg/dL — ABNORMAL HIGH (ref 70–99)
Glucose-Capillary: 116 mg/dL — ABNORMAL HIGH (ref 70–99)

## 2019-05-06 LAB — PREPARE RBC (CROSSMATCH)

## 2019-05-06 LAB — RENAL FUNCTION PANEL
Albumin: 2.1 g/dL — ABNORMAL LOW (ref 3.5–5.0)
Albumin: 2.3 g/dL — ABNORMAL LOW (ref 3.5–5.0)
Anion gap: 10 (ref 5–15)
Anion gap: 14 (ref 5–15)
BUN: 64 mg/dL — ABNORMAL HIGH (ref 6–20)
BUN: 54 mg/dL — ABNORMAL HIGH (ref 6–20)
CO2: 24 mmol/L (ref 22–32)
CO2: 23 mmol/L (ref 22–32)
Calcium: 9.4 mg/dL (ref 8.9–10.3)
Calcium: 10 mg/dL (ref 8.9–10.3)
Chloride: 102 mmol/L (ref 98–111)
Chloride: 98 mmol/L (ref 98–111)
Creatinine, Ser: 5.21 mg/dL — ABNORMAL HIGH (ref 0.61–1.24)
Creatinine, Ser: 4.23 mg/dL — ABNORMAL HIGH (ref 0.61–1.24)
GFR calc Af Amer: 13 mL/min — ABNORMAL LOW (ref >60)
GFR calc Af Amer: 17 mL/min — ABNORMAL LOW (ref >60)
GFR calc non Af Amer: 12 mL/min — ABNORMAL LOW (ref >60)
GFR calc non Af Amer: 15 mL/min — ABNORMAL LOW (ref >60)
Glucose, Bld: 148 mg/dL — ABNORMAL HIGH (ref 70–99)
Glucose, Bld: 148 mg/dL — ABNORMAL HIGH (ref 70–99)
Phosphorus: 5 mg/dL — ABNORMAL HIGH (ref 2.5–4.6)
Phosphorus: 4.9 mg/dL — ABNORMAL HIGH (ref 2.5–4.6)
Potassium: 5.5 mmol/L — ABNORMAL HIGH (ref 3.5–5.1)
Potassium: 5.5 mmol/L — ABNORMAL HIGH (ref 3.5–5.1)
Sodium: 136 mmol/L (ref 135–145)
Sodium: 135 mmol/L (ref 135–145)

## 2019-05-06 LAB — CBC
HCT: 20.8 % — ABNORMAL LOW (ref 39.0–52.0)
Hemoglobin: 6.8 g/dL — CL (ref 13.0–17.0)
MCH: 28.3 pg (ref 26.0–34.0)
MCHC: 32.7 g/dL (ref 30.0–36.0)
MCV: 86.7 fL (ref 80.0–100.0)
Platelets: 223 10*3/uL (ref 150–400)
RBC: 2.4 MIL/uL — ABNORMAL LOW (ref 4.22–5.81)
RDW: 17 % — ABNORMAL HIGH (ref 11.5–15.5)
WBC: 6.6 10*3/uL (ref 4.0–10.5)
nRBC: 0 % (ref 0.0–0.2)

## 2019-05-06 LAB — GLOMERULAR BASEMENT MEMBRANE ANTIBODIES: GBM Ab: 6 units (ref 0–20)

## 2019-05-06 LAB — HEMOGLOBIN AND HEMATOCRIT, BLOOD
HCT: 26.6 % — ABNORMAL LOW (ref 39.0–52.0)
Hemoglobin: 8.8 g/dL — ABNORMAL LOW (ref 13.0–17.0)

## 2019-05-06 LAB — MPO/PR-3 (ANCA) ANTIBODIES
ANCA Proteinase 3: <3.5 U/mL (ref 0.0–3.5)
Myeloperoxidase Abs: <9 U/mL (ref 0.0–9.0)

## 2019-05-06 LAB — MAGNESIUM: Magnesium: 2.5 mg/dL — ABNORMAL HIGH (ref 1.7–2.4)

## 2019-05-06 LAB — TROPONIN I (HIGH SENSITIVITY): Troponin I (High Sensitivity): 229 ng/L (ref <18)

## 2019-05-06 MED ORDER — SODIUM CHLORIDE 0.9% IV SOLUTION: Freq: Once | INTRAVENOUS | Status: DC

## 2019-05-06 MED ORDER — FAMOTIDINE 40 MG/5ML PO SUSR
20.0000 mg | Freq: Every day | ORAL | Status: DC
  Administered 2019-05-06 – 2019-05-09 (×4): 20 mg
  Filled 2019-05-06 (×5): qty 2.5

## 2019-05-06 MED ORDER — CLEVIDIPINE BUTYRATE 0.5 MG/ML IV EMUL
0.0000 mg/h | INTRAVENOUS | Status: DC
  Administered 2019-05-06: 1 mg/h via INTRAVENOUS
  Administered 2019-05-06: 4 mg/h via INTRAVENOUS
  Filled 2019-05-06 (×3): qty 50

## 2019-05-06 MED ORDER — HYDRALAZINE HCL 20 MG/ML IJ SOLN
5.0000 mg | Freq: Once | INTRAMUSCULAR | Status: AC
  Administered 2019-05-06: 5 mg via INTRAVENOUS

## 2019-05-06 MED ORDER — HEPARIN SODIUM (PORCINE) 5000 UNIT/ML IJ SOLN
5000.0000 [IU] | Freq: Three times a day (TID) | INTRAMUSCULAR | Status: DC
  Administered 2019-05-06 – 2019-05-10 (×12): 5000 [IU] via SUBCUTANEOUS
  Filled 2019-05-06 (×12): qty 1

## 2019-05-06 NOTE — Progress Notes (Signed)
[facility] Progress Note Patient Name: Troy Wells DOB: [birth year] MRN: [id number]   Date of Service  05/06/2019  HPI/Events of Note  Recent hemoptysis, hemoglobin 6.8 gm%  eICU Interventions  Order to transfuse one unit of PRBC entered.        Kerry Kass Leighla Chestnutt 05/06/2019, 6:34 AM

## 2019-05-06 NOTE — Progress Notes (Signed)
Subjective:  Got started on CRRT yesterday.  Worsening encephalopathy-  EEG= diffuse encephalopathy-  Now on pressors (transient) /paralyzed - has hemoptysis- labs sent for DIC and pulm -renal syndrome- all pending. Total of 3.3 liters off    Objective Vital signs in last 24 hours: Vitals:   05/06/19 0530 05/06/19 0545 05/06/19 0600 05/06/19 0615  BP:   (!) 131/55   Pulse: 63 63 62 71  Resp: (!) 30 (!) 30 (!) 30 (!) 30  Temp:      TempSrc:      SpO2: 100% 100% 100% 100%  Weight:      Height:       Weight change: -2.2 kg  Intake/Output Summary (Last 24 hours) at 05/06/2019 0654 Last data filed at 05/06/2019 0600 Gross per 24 hour  Intake 3458.15 ml  Output 6743 ml  Net -3284.85 ml    Outpt HD:TTS -  [facility] 4.5h F250 124.5kg 400/800 2/2.25 Hep 12,000 AVF - mircera 30 q4 last 10/13 - hect 4   Assessment/ Plan: Pt is a 53 y.o. yo male who was admitted on 04/27/2019 with resp failure due to COVID/pulm edema  Assessment/Plan: 1. Resp failure - remains on vent, had some worsening last 24 hours-- now paralyzed - per CCM. Zosyn/vanc 2. ESRD - normally TTS as OP via TDC. Had reg HD Mon and Tuesday.  Decision made to put on CRRT yest in response to him being catabolic with vent difficulties.  Labs still abnormal even on 2 liter dialysate-  Continue for now.  K is trending down 3. Anemia- hgb dropping- s/p transfusion 11/16-  Increased his ESA- cont supportive care-  Transfusion today as well for hgb 6.8  4. Secondary hyperparathyroidism- normally on hect 4-- has not been given here-  Calcium corrected is high- on renvela for his phos but getting TF so difficult -  Phos will likely come down on CRRT  5. HTN/volume- appearing to be volume overloaded -  CRRT for more effective volume removal - will start with 200 per hour - was tolerating- but then req transient pressors, appears to be weaning off    [place]: Basic Metabolic Panel: Recent Labs   Lab 05/05/19 0353  05/05/19 1228 05/05/19 1600 05/06/19 0432  NA 138   < > 134* 136 136  K 5.8*   < > 6.1* 5.8* 5.5*  CL 96*  --   --  100 102  CO2 27  --   --  24 24  GLUCOSE 113*  --   --  148* 148*  BUN 80*  --   --  78* 64*  CREATININE 8.24*  --   --  6.59* 5.21*  CALCIUM 9.7  --   --  9.3 9.4  PHOS 8.2*  --   --  5.9* 5.0*   < > = values in this interval not displayed.   Liver Function Tests: Recent Labs  Lab 04/30/19 0443 05/05/19 1600 05/06/19 0432  ALBUMIN 2.7* 2.1* 2.1*   No results for input(s): LIPASE, AMYLASE in the last 168 hours. No results for input(s): AMMONIA in the last 168 hours. CBC: Recent Labs  Lab 05/02/19 0527 05/03/19 0354  05/04/19 0500 05/05/19 0353  05/05/19 0934  05/05/19 1228 05/05/19 1600 05/06/19 0432  WBC 7.8 6.0  --  7.1 5.9  --   --   --   --  8.7 6.6  NEUTROABS 5.9 4.0  --   --   --   --   --   --   --   --   --  HGB 6.8* 5.8*   < > 7.0* 7.6*   < >  --    < > 6.1* 7.1* 6.8*  HCT 20.1* 17.7*   < > 21.2* 23.6*   < >  --    < > 18.0* 20.9* 20.8*  MCV 82.0 84.3  --  85.5 87.1  --   --   --   --  85.3 86.7  PLT 131* 110*  --  132* 173  --  196  --   --  198 223   < > = values in this interval not displayed.   Cardiac Enzymes: No results for input(s): CKTOTAL, CKMB, CKMBINDEX, TROPONINI in the last 168 hours. CBG: Recent Labs  Lab 05/05/19 1225 05/05/19 1535 05/05/19 2003 05/05/19 2323 05/06/19 0328  GLUCAP 152* 129* 162* 124* 141*    Iron Studies: No results for input(s): IRON, TIBC, TRANSFERRIN, FERRITIN in the last 72 hours. Studies/Results: Dg Chest Port 1 View  Result Date: 05/05/2019 CLINICAL DATA:  COVID-19 positive, endotracheal tube. EXAM: PORTABLE CHEST 1 VIEW COMPARISON:  May 02, 2019. FINDINGS: Stable cardiomegaly. Endotracheal and nasogastric tubes are unchanged in position. Right internal jugular catheter is unchanged. No pneumothorax or pleural effusion is noted. Significantly increased bilateral lung  opacities are noted concerning for worsening pneumonia. Bony thorax is unremarkable. IMPRESSION: Stable support apparatus. Significant increased bilateral lung opacities are noted concerning for worsening pneumonia. Electronically Signed   By: Marijo Conception M.D.   On: 05/05/2019 07:31   Medications: Infusions: .  prismasol BGK 4/2.5 300 mL/hr at 05/06/19 0513  .  prismasol BGK 4/2.5 300 mL/hr at 05/06/19 0513  . albumin human    . cisatracurium (NIMBEX) infusion 5.5 mcg/kg/min (05/06/19 0600)  . feeding supplement (VITAL 1.5 CAL) 10 mL/hr at 05/05/19 2001  . fentaNYL infusion INTRAVENOUS Stopped (05/05/19 1030)  . heparin    . HYDROmorphone 2 mg/hr (05/06/19 0600)  . levETIRAcetam Stopped (05/05/19 2327)  . midazolam 7 mg/hr (05/06/19 0600)  . norepinephrine (LEVOPHED) Adult infusion 6 mcg/min (05/06/19 0600)  . piperacillin-tazobactam 3.375 g (05/06/19 0625)  . prismasol BGK 4/2.5 2,000 mL/hr at 05/06/19 0154  . vancomycin      Scheduled Medications: . sodium chloride   Intravenous Once  . sodium chloride   Intravenous Once  . alteplase  2 mg Intracatheter Once  . artificial tears  1 application Both Eyes U8E  . aspirin  81 mg Per Tube Daily  . atorvastatin  80 mg Per Tube Daily  . chlorhexidine gluconate (MEDLINE KIT)  15 mL Mouth Rinse BID  . Chlorhexidine Gluconate Cloth  6 each Topical Q0600  . Chlorhexidine Gluconate Cloth  6 each Topical Q0600  . darbepoetin (ARANESP) injection - DIALYSIS  200 mcg Intravenous Q Tue-HD  . docusate  100 mg Per Tube BID  . famotidine  10.4 mg Per Tube Q0600  . feeding supplement (PRO-STAT SUGAR FREE 64)  30 mL Per Tube QID  . insulin aspart  2-6 Units Subcutaneous Q4H  . mouth rinse  15 mL Mouth Rinse 10 times per day  . sevelamer carbonate  0.8 g Oral TID    have reviewed scheduled and prn medications.  Physical Exam: Full PE not done-  I am able to visualize patient in room.  Kept in isolation due to COVID positive status to preserve  PPE and to limit exposure to providers as able   05/06/2019,6:54 AM  LOS: 9 days

## 2019-05-06 NOTE — Progress Notes (Addendum)
NAME:  Troy Wells, MRN:  [id number], DOB:  1965-08-26, LOS: 9 ADMISSION DATE:  04/27/2019, CONSULTATION DATE:  11/10 REFERRING MD:  Johnney Killian, CHIEF COMPLAINT:  Cardiac arrest   Brief History   53 y/o male with ESRD and COVID 19 had a cardiac arrest at dialysis, 12 minutes.  History of present illness   Unable to provide history due to critical illness. History obtained by chart review.  53 year old male with ESRD on HD who presents after syncopal episode and found in PEA arrest. Patient presented to dialysis center and on arrival complained of shortness of breath. He was found pale, diaphoretic and hypoxemic to 71% and bradycardic to 40s. During transport, he went into PEA arrest and received CPR for 13 minutes with epi x1, calcium and 1/2 amp sodium bicarb. King airway was placed in the field. On arrival to the ED, kingairway was exchanged for ETT. PCCM consulted for admission.  Of note, patient had recent admission for COVID-19 pneumonia and treated with oxygen, steroids, remdsivir and decadron. Hospital admission significant for NSTEMI and reduced EF. He was scheduled to follow-up with Cardiology to consider outpatient nuclear test. On discharge, he was able to maintain saturations on room air.   Past Medical History   has a past medical history of CARDIAC ARREST (11/01/2009), Colon polyps (01/02/2016), Coronary artery disease (08/15/2009), Dialysis patient [facility]) (09-27-2014), Diverticulosis of colon without hemorrhage (01/02/2016), DM (diabetes mellitus), type 2 with renal complications ([place]) (7/90/2409), Essential hypertension (11/01/2009), Hyperlipidemia, Hypertension, Mitral valve regurgitation (09/19/2015), OSA treated with BiPAP (06/17/2010), and Sleep apnea.   Significant Hospital Events   11/10 Admitted 11/11Myoclonic activity, HD with 2.2L removed 11/12 Fentanyl100 mcg's. Any stimulation, he has abnormal motor movements, nofollow commands.  11/13 Fentanyl 100 mcg's, doppler  negative, heparin gtt stopped 11/14 PM had severe hypoxemia, flash edema, emergent bronchoscopy frothy secretions 11/16 MRI concerning for anoxic injury 11/17 waking up. Follows commands. Wean 5/5 for several hours. 11/18 decompensation. Back on sedation and full support. Start CRRT  Consults:  PCCM neurology Nephrology  Procedures:  ETT 11/10 >> R IJ CVL 11/14 >  Significant Diagnostic Tests:  CXR 11/10 > Bilateral airspace disease suggestive of volume overload ECHO 11/10 >> LVEF 40-45%, mild LVH, basal to mid anterolateral severe hypokinesis, grade II diastolic dysfunction, global RV function normal, LA/RA mildly dilated LE Venous Duplex 11/11 >> CT Head 11/12 >> no acute abnormality, old R cerebellar infarct 11/13 EEG > severe diffuse encephalopathy, non-specific in etiology 11/14 bronch> frothy bloody secretions MRI brain 11/15 > Heterogeneous signal in the bilateral medial thalami is suspicious for anoxic injury in this clinical setting. Artifact or remote thalamic insults are felt less likely. No other acute intracranial abnormality identified. Chronic right PICA cerebellar infarct   Micro Data:  SARS COV 2 10/28 > positive BCx2 11/10 >> negative  Antimicrobials:  vanc 11/18 -  Zosyn 11/18 -    Interim history/subjective:  Pt intubated and sedated.   Objective   Blood pressure 114/73, pulse 74, temperature 97.6 F (36.4 C), temperature source Oral, resp. rate (!) 30, height 5\' 10"  (1.778 m), weight 118.7 kg, SpO2 97 %. CVP:  [3 mmHg-8 mmHg] 3 mmHg  Vent Mode: PRVC FiO2 (%):  [40 %-80 %] 40 % Set Rate:  [30 bmp] 30 bmp Vt Set:  [430 mL] 430 mL PEEP:  [10 cmH20-14 cmH20] 10 cmH20 Plateau Pressure:  [22 cmH20-26 cmH20] 22 cmH20   Intake/Output Summary (Last 24 hours) at 05/06/2019 7353 Last data filed at  05/06/2019 0800 Gross per 24 hour  Intake 3463.94 ml  Output 7588 ml  Net -4124.06 ml   Filed Weights   05/04/19 0449 05/05/19 0350 05/06/19 0436  Weight:  121.4 kg 120.9 kg 118.7 kg    Examination: General: intubated, sedated.   HENT: oral serosanginous secretions on towel next to pt's mouth.   Lungs: on ventilator. Course breath sounds.  Cardiovascular: difficult to auscultate. Mildly tachycardic.  Abdomen: distended. Normal bowel sounds.  Extremities: no edema.  Neuro: sedated.    Resolved Hospital Problem list     Assessment & Plan:  ASSESSMENT / PLAN:  PULMONARY A:  Severe acute respiratory failure with hypoxemia w/ positive COVID test (repeat on 11/18 positive). Worsening yesterday w/ bloody secretions, concern for ARDS or Diffuse alveolar hemorrhage. Tracheal aspiate culture sent 11/18 - abundant GPR, rare GNR  P:   - mechanical ventilation per ARDS protocol.  - PRVC 6cc/kg - Target Plateau Pressure < 30cm H20 (~30 on current settings) - Target driving pressure less than 15 cm of water (16 on current settings) - Target PaO2 55-65: titrate PEEP/FiO2 per protocol - f/u ANA, Anca, GBM labs - continue empiric abx: vanc and zosyn (11/19-) - f/u aspirate cultures.   NEUROLOGIC A:   Mental status worsened on 11/18, now requiring sedation. Concern for anoxic brain injury. EEG on 11/18 showed worsening profund diffuse encephalopathy. No epileptiform discharges seen.  Currently sedated.  P:   - continue to monitor for mental status changes/neurologic changes.  - continue keppra 500mg  BID  VASCULAR A:   BP currently hypertensive. Levophed stopped. IV hydral given . DVT u/s on 11/18 showed no evidence of DVT b/l. IV hydralazine given.  P:  - hydralazine as needed.  - start cleviprex drip  CARDIAC STRUCTURAL A: Hx of CAD. Echo on 11/10 shows LVEF of 40-45% w/ moderately decrased funcoitn, mild LVH. G2DD.   P: - Continuous cardiac telemetry - holding home BB, norvasc, minoxidil, demadex - continue lipitor 80mg    CARDIAC ELECTRICAL A: EKG from 11/18 shows mild ST elevation in V3-4. Appears consistent with ekg on 11/14.  Trop 11/18 was 393>363.   P: - Continuous cardiac monitoring.   INFECTIOUS A:   covid positive.  Concern for new onset PNA given worsening respiratory status yesterday.   P:   - IV vanc and zosyn (11/18-) - f/u aspirate cultures  RENAL A:  Pt has ESRD, on TTS HD regularly. On CRRT since 11/18.  P:  - f/u nephrology recs - continue CRRT, transition to HD as able.   ELECTROLYTES A:  Pt is on CRRT.  Elevated K and phos this am.   P: - manage electrolyte levels w/ CRRT.  - renvela for hyperphos.    GASTROINTESTINAL A:   No acute GI issues.   P:   - continue famotidine daily.  - continue docusate BID, miralax PRN  HEMATOLOGIC A:  Anemia of CKD but also may have some component of acute blood loss given bloody secretions on 11/18. Has Hgb of 6.8 this morning. Pt was transfused 1 U pRBC this am.   P:  -PRBC for hgb </= 6.9gm%    - exceptions are   -  if ACS susepcted/confirmed then transfuse for hgb </= 8.0gm%,  or    -  active bleeding with hemodynamic instability, then transfuse regardless of hemoglobin value   At at all times try to transfuse 1 unit prbc as possible with exception of active hemorrhage - daily cbc - darbopoetin qTuesday  ENDOCRINE A:   Has diabetes type 2.    P:   - continue SSI and CBG q4h  MSK/DERM No concerns.   Best practice:  Diet: tube feeds Pain/Anxiety/Delirium protocol (if indicated): as above VAP protocol (if indicated): yes DVT prophylaxis: sub Q hep per protocol GI prophylaxis: famotidine Glucose control: SSI Mobility: bed rest Code Status: full Family Communication: continue to update dauther.  Disposition: ICU  Labs   CBC: Recent Labs  Lab 05/02/19 0527 05/03/19 0354  05/04/19 0500 05/05/19 0353 05/05/19 0920 05/05/19 0934 05/05/19 0950 05/05/19 1228 05/05/19 1600 05/06/19 0432  WBC 7.8 6.0  --  7.1 5.9  --   --   --   --  8.7 6.6  NEUTROABS 5.9 4.0  --   --   --   --   --   --   --   --   --   HGB 6.8*  5.8*   < > 7.0* 7.6* 7.5*  --  7.5* 6.1* 7.1* 6.8*  HCT 20.1* 17.7*   < > 21.2* 23.6* 22.0*  --  22.5* 18.0* 20.9* 20.8*  MCV 82.0 84.3  --  85.5 87.1  --   --   --   --  85.3 86.7  PLT 131* 110*  --  132* 173  --  196  --   --  198 223   < > = values in this interval not displayed.    Basic Metabolic Panel: Recent Labs  Lab 05/03/19 0354  05/04/19 0500 05/05/19 0353 05/05/19 0920 05/05/19 1228 05/05/19 1600 05/06/19 0432  NA 136   < > 138 138 134* 134* 136 136  K 5.9*   < > 5.3* 5.8* 6.2* 6.1* 5.8* 5.5*  CL 96*  --  97* 96*  --   --  100 102  CO2 25  --  26 27  --   --  24 24  GLUCOSE 119*  --  111* 113*  --   --  148* 148*  BUN 100*  --  77* 80*  --   --  78* 64*  CREATININE 10.70*  --  8.18* 8.24*  --   --  6.59* 5.21*  CALCIUM 9.0  --  9.1 9.7  --   --  9.3 9.4  MG  --   --  2.4 2.4  --   --   --  2.5*  PHOS 7.5*  --  6.5* 8.2*  --   --  5.9* 5.0*   < > = values in this interval not displayed.   GFR: Estimated Creatinine Clearance: 20.9 mL/min (A) (by C-G formula based on SCr of 5.21 mg/dL (H)). Recent Labs  Lab 05/04/19 0500 05/05/19 0353 05/05/19 1600 05/06/19 0432  WBC 7.1 5.9 8.7 6.6    Liver Function Tests: Recent Labs  Lab 04/30/19 0443 05/05/19 1600 05/06/19 0432  ALBUMIN 2.7* 2.1* 2.1*   No results for input(s): LIPASE, AMYLASE in the last 168 hours. No results for input(s): AMMONIA in the last 168 hours.  ABG    Component Value Date/Time   PHART 7.347 (L) 05/05/2019 1228   PCO2ART 51.9 (H) 05/05/2019 1228   PO2ART 277.0 (H) 05/05/2019 1228   HCO3 28.4 (H) 05/05/2019 1228   TCO2 30 05/05/2019 1228   ACIDBASEDEF 9.0 (H) 05/01/2019 1501   O2SAT 100.0 05/05/2019 1228     Coagulation Profile: Recent Labs  Lab 05/05/19 0934  INR 1.1    Cardiac Enzymes: No results for  input(s): CKTOTAL, CKMB, CKMBINDEX, TROPONINI in the last 168 hours.  HbA1C: Hemoglobin-A1c  Date/Time Value Ref Range Status  09/14/2009 09:43 AM 8.5 (H) 5.4 - 7.4 %  Final   Hemoglobin A1C  Date/Time Value Ref Range Status  11/21/2016 02:33 PM 5.3  Final  04/02/2016 10:05 AM 6.1  Final   HbA1c, POC (controlled diabetic range)  Date/Time Value Ref Range Status  10/21/2018 11:25 AM 5.7 0.0 - 7.0 % Final   Hgb A1c MFr Bld  Date/Time Value Ref Range Status  04/14/2019 02:00 AM 5.8 (H) 4.8 - 5.6 % Final    Comment:    (NOTE) Pre diabetes:          5.7%-6.4% Diabetes:              >6.4% Glycemic control for   <7.0% adults with diabetes     CBG: Recent Labs  Lab 05/05/19 1535 05/05/19 2003 05/05/19 2323 05/06/19 0328 05/06/19 0758  GLUCAP 129* 162* 124* 141* 129*    Review of Systems:   Unable to assess.   Past Medical History  He,  has a past medical history of CARDIAC ARREST (11/01/2009), Colon polyps (01/02/2016), Coronary artery disease (08/15/2009), Dialysis patient [facility]) (09-27-2014), Diverticulosis of colon without hemorrhage (01/02/2016), DM (diabetes mellitus), type 2 with renal complications ([place]) (0/26/3785), Essential hypertension (11/01/2009), Hyperlipidemia, Hypertension, Mitral valve regurgitation (09/19/2015), OSA treated with BiPAP (06/17/2010), and Sleep apnea.   Surgical History    Past Surgical History:  Procedure Laterality Date  . AV FISTULA PLACEMENT Left 09/27/2014  . INSERTION OF ARTERIOVENOUS (AV) ARTEGRAFT ARM Left 04/02/2018   Procedure: INSERTION OF ARTERIOVENOUS (AV) ARTEGRAFT ARM LEFT UPPER ARM;  Surgeon: Waynetta Sandy, MD;  Location: [place];  Service: Vascular;  Laterality: Left;  . INSERTION OF DIALYSIS CATHETER N/A 04/02/2018   Procedure: INSERTION OF DIALYSIS CATHETER, right internal jugular;  Surgeon: Waynetta Sandy, MD;  Location: [place];  Service: Vascular;  Laterality: N/A;  . NECK SURGERY     C6 & C7 replaced 30 yrs ago per pt  . REVISON OF ARTERIOVENOUS FISTULA Left 04/02/2018   Procedure: REVISION PLICATION OF ARTERIOVENOUS FISTULA ARM;  Surgeon: Waynetta Sandy, MD;   Location[facility] [place];  Service: Vascular;  Laterality: Left;  . WISDOM TOOTH EXTRACTION       Social History   reports that he quit smoking about 7 years ago. His smoking use included cigars. He has a 10.00 pack-year smoking history. He has never used smokeless tobacco. He reports current alcohol use of about 1.0 standard drinks of alcohol per week. He reports that he does not use drugs.   Family History   His family history includes Alcohol abuse in his brother; Aneurysm (age of onset: 81) in his father; Diabetes in his brother; Heart disease (age of onset: 16) in his mother; Heart disease (age of onset: 70) in his father; Hypertension in his brother, brother, father, and mother; Pancreatitis in his brother. There is no history of Colon cancer.   Allergies No Known Allergies   Home Medications  Prior to Admission medications   Medication Sig Start Date End Date Taking? Authorizing Provider  albuterol (VENTOLIN HFA) 108 (90 Base) MCG/ACT inhaler Inhale 1-2 puffs into the lungs every 6 (six) hours as needed for wheezing or shortness of breath. 04/20/19   Daisy Floro, DO  allopurinol (ZYLOPRIM) 100 MG tablet Take 100 mg by mouth 2 (two) times daily.  10/04/14   [provider]  amLODipine ([place])  10 MG tablet Take 10 mg by mouth daily.    [provider]  aspirin 81 MG EC tablet Take 81 mg by mouth daily. 04/21/19   [provider]  atorvastatin (LIPITOR) 80 MG tablet Take 1 tablet (80 mg total) by mouth daily. 04/21/19   Daisy Floro, DO  AURYXIA 1 GM 210 MG(Fe) tablet Take 420-840 mg by mouth See admin instructions. 840 mg with meals, and 420 mg with snacks 01/11/19   [provider]  carvedilol (COREG) 25 MG tablet Take 1 tablet (25 mg total) by mouth 2 (two) times daily with a meal. 08/31/15   [name], MD  cetirizine (ZYRTEC) 5 MG tablet Take 1 tablet (5 mg total) by mouth daily. Patient taking differently: Take 5 mg by mouth daily as needed  for allergies.  10/21/18   [name], MD  colchicine 0.6 MG tablet Take 0.6 mg by mouth 2 (two) times daily as needed (gout).  12/11/10   [provider]  gabapentin (NEURONTIN) 300 MG capsule TAKE ONE CAPSULE BY MOUTH THREE TIMES DAILY Patient taking differently: Take 300 mg by mouth at bedtime.  09/30/16   [name], MD  glipiZIDE (GLUCOTROL) 5 MG tablet Take 5 mg by mouth 2 (two) times daily.  10/04/14   [provider]  linagliptin (TRADJENTA) 5 MG TABS tablet Take 1 tablet (5 mg total) by mouth daily. 04/20/19   Daisy Floro, DO  minoxidil (LONITEN) 10 MG tablet Take 2 tablets (20 mg total) by mouth 2 (two) times daily. 09/11/17   Zenia Resides, MD  multivitamin (RENA-VIT) TABS tablet Take 1 tablet by mouth at bedtime.    [provider]  PRESCRIPTION MEDICATION Pt has CPAP machine    [provider]  torsemide (DEMADEX) 100 MG tablet Take 100 mg by mouth See admin instructions. Take on Tuesday, Thursday, Saturday, and Sunday     [provider]     Critical care time: 86

## 2019-05-07 ENCOUNTER — Inpatient Hospital Stay (HOSPITAL_COMMUNITY): Payer: Medicare Other

## 2019-05-07 DIAGNOSIS — U071 COVID-19: Secondary | ICD-10-CM | POA: Diagnosis not present

## 2019-05-07 DIAGNOSIS — J9601 Acute respiratory failure with hypoxia: Secondary | ICD-10-CM | POA: Diagnosis not present

## 2019-05-07 DIAGNOSIS — N186 End stage renal disease: Secondary | ICD-10-CM | POA: Diagnosis not present

## 2019-05-07 DIAGNOSIS — E875 Hyperkalemia: Secondary | ICD-10-CM | POA: Diagnosis not present

## 2019-05-07 LAB — CBC
HCT: 26.3 % — ABNORMAL LOW (ref 39.0–52.0)
Hemoglobin: 8.7 g/dL — ABNORMAL LOW (ref 13.0–17.0)
MCH: 28.3 pg (ref 26.0–34.0)
MCHC: 33.1 g/dL (ref 30.0–36.0)
MCV: 85.7 fL (ref 80.0–100.0)
Platelets: 268 10*3/uL (ref 150–400)
RBC: 3.07 MIL/uL — ABNORMAL LOW (ref 4.22–5.81)
RDW: 17.2 % — ABNORMAL HIGH (ref 11.5–15.5)
WBC: 7.6 10*3/uL (ref 4.0–10.5)
nRBC: 0 % (ref 0.0–0.2)

## 2019-05-07 LAB — POCT ACTIVATED CLOTTING TIME
Activated Clotting Time: 147 seconds
Activated Clotting Time: 153 seconds
Activated Clotting Time: 153 seconds
Activated Clotting Time: 153 seconds
Activated Clotting Time: 158 seconds
Activated Clotting Time: 158 seconds
Activated Clotting Time: 158 seconds
Activated Clotting Time: 164 seconds
Activated Clotting Time: 169 seconds
Activated Clotting Time: 175 seconds
Activated Clotting Time: 169 seconds
Activated Clotting Time: 180 seconds
Activated Clotting Time: 180 seconds
Activated Clotting Time: 180 seconds

## 2019-05-07 LAB — RENAL FUNCTION PANEL
Albumin: 2.3 g/dL — ABNORMAL LOW (ref 3.5–5.0)
Albumin: 2.2 g/dL — ABNORMAL LOW (ref 3.5–5.0)
Anion gap: 15 (ref 5–15)
Anion gap: 12 (ref 5–15)
BUN: 51 mg/dL — ABNORMAL HIGH (ref 6–20)
BUN: 48 mg/dL — ABNORMAL HIGH (ref 6–20)
CO2: 22 mmol/L (ref 22–32)
CO2: 22 mmol/L (ref 22–32)
Calcium: 10.1 mg/dL (ref 8.9–10.3)
Calcium: 9.7 mg/dL (ref 8.9–10.3)
Chloride: 100 mmol/L (ref 98–111)
Chloride: 100 mmol/L (ref 98–111)
Creatinine, Ser: 3.66 mg/dL — ABNORMAL HIGH (ref 0.61–1.24)
Creatinine, Ser: 3.62 mg/dL — ABNORMAL HIGH (ref 0.61–1.24)
GFR calc Af Amer: 21 mL/min — ABNORMAL LOW (ref >60)
GFR calc Af Amer: 21 mL/min — ABNORMAL LOW (ref >60)
GFR calc non Af Amer: 18 mL/min — ABNORMAL LOW (ref >60)
GFR calc non Af Amer: 18 mL/min — ABNORMAL LOW (ref >60)
Glucose, Bld: 125 mg/dL — ABNORMAL HIGH (ref 70–99)
Glucose, Bld: 237 mg/dL — ABNORMAL HIGH (ref 70–99)
Phosphorus: 5.3 mg/dL — ABNORMAL HIGH (ref 2.5–4.6)
Phosphorus: 4.9 mg/dL — ABNORMAL HIGH (ref 2.5–4.6)
Potassium: 5.8 mmol/L — ABNORMAL HIGH (ref 3.5–5.1)
Potassium: 5.4 mmol/L — ABNORMAL HIGH (ref 3.5–5.1)
Sodium: 137 mmol/L (ref 135–145)
Sodium: 134 mmol/L — ABNORMAL LOW (ref 135–145)

## 2019-05-07 LAB — TYPE AND SCREEN
ABO/RH(D): O POS
Antibody Screen: NEGATIVE
Unit division: 0
Unit division: 0

## 2019-05-07 LAB — TROPONIN I (HIGH SENSITIVITY)
Troponin I (High Sensitivity): 209 ng/L (ref <18)
Troponin I (High Sensitivity): 204 ng/L (ref <18)

## 2019-05-07 LAB — CULTURE, RESPIRATORY W GRAM STAIN

## 2019-05-07 LAB — GLUCOSE, CAPILLARY
Glucose-Capillary: 96 mg/dL (ref 70–99)
Glucose-Capillary: 119 mg/dL — ABNORMAL HIGH (ref 70–99)
Glucose-Capillary: 104 mg/dL — ABNORMAL HIGH (ref 70–99)
Glucose-Capillary: 142 mg/dL — ABNORMAL HIGH (ref 70–99)
Glucose-Capillary: 126 mg/dL — ABNORMAL HIGH (ref 70–99)
Glucose-Capillary: 214 mg/dL — ABNORMAL HIGH (ref 70–99)

## 2019-05-07 LAB — BPAM RBC
Blood Product Expiration Date: 202012172359
Blood Product Expiration Date: 202012232359
ISSUE DATE / TIME: 202011160940
ISSUE DATE / TIME: 202011190700
Unit Type and Rh: 5100
Unit Type and Rh: 5100

## 2019-05-07 LAB — TRIGLYCERIDES: Triglycerides: 241 mg/dL — ABNORMAL HIGH (ref <150)

## 2019-05-07 LAB — MAGNESIUM: Magnesium: 2.6 mg/dL — ABNORMAL HIGH (ref 1.7–2.4)

## 2019-05-07 LAB — APTT: aPTT: 43 seconds — ABNORMAL HIGH (ref 24–36)

## 2019-05-07 MED ORDER — HYDROMORPHONE BOLUS VIA INFUSION
0.5000 mg | INTRAVENOUS | Status: DC | PRN
  Filled 2019-05-07: qty 1

## 2019-05-07 MED ORDER — ASPIRIN 81 MG PO CHEW
324.0000 mg | CHEWABLE_TABLET | Freq: Every day | ORAL | Status: DC
  Administered 2019-05-08 – 2019-05-10 (×3): 324 mg
  Filled 2019-05-07 (×3): qty 4

## 2019-05-07 MED ORDER — SODIUM CHLORIDE 0.9 % IV SOLN
1.0000 g | INTRAVENOUS | Status: AC
  Administered 2019-05-07 – 2019-05-11 (×5): 1 g via INTRAVENOUS
  Filled 2019-05-07 (×5): qty 1

## 2019-05-07 MED ORDER — LABETALOL HCL 5 MG/ML IV SOLN: 10.0000 mg | INTRAVENOUS | Status: DC | PRN

## 2019-05-07 MED ORDER — MIDAZOLAM 50MG/50ML (1MG/ML) PREMIX INFUSION
0.0000 mg/h | INTRAVENOUS | Status: DC
  Administered 2019-05-07 (×2): 8 mg/h via INTRAVENOUS
  Administered 2019-05-08: 10 mg/h via INTRAVENOUS
  Administered 2019-05-08: 9 mg/h via INTRAVENOUS
  Administered 2019-05-08: 6 mg/h via INTRAVENOUS
  Administered 2019-05-08 – 2019-05-09 (×2): 10 mg/h via INTRAVENOUS
  Filled 2019-05-07 (×7): qty 50

## 2019-05-07 MED ORDER — SODIUM CHLORIDE 0.9 % IV SOLN
250.0000 [IU]/h | INTRAVENOUS | Status: DC
  Administered 2019-05-07: 1300 [IU]/h via INTRAVENOUS_CENTRAL
  Administered 2019-05-07: 250 [IU]/h via INTRAVENOUS_CENTRAL
  Administered 2019-05-07: 1650 [IU]/h via INTRAVENOUS_CENTRAL
  Administered 2019-05-08 (×2): 1750 [IU]/h via INTRAVENOUS_CENTRAL
  Administered 2019-05-08: 1700 [IU]/h via INTRAVENOUS_CENTRAL
  Administered 2019-05-08: 500 [IU]/h via INTRAVENOUS_CENTRAL
  Administered 2019-05-09: 1550 [IU]/h via INTRAVENOUS_CENTRAL
  Administered 2019-05-09: 1350 [IU]/h via INTRAVENOUS_CENTRAL
  Administered 2019-05-09: 1250 [IU]/h via INTRAVENOUS_CENTRAL
  Administered 2019-05-10: 1350 [IU]/h via INTRAVENOUS_CENTRAL
  Administered 2019-05-10 – 2019-05-11 (×5): 1500 [IU]/h via INTRAVENOUS_CENTRAL
  Filled 2019-05-07 (×17): qty 2

## 2019-05-07 MED ORDER — MIDAZOLAM BOLUS VIA INFUSION
1.0000 mg | INTRAVENOUS | Status: DC | PRN
  Administered 2019-05-08: 2 mg via INTRAVENOUS
  Filled 2019-05-07: qty 2

## 2019-05-07 MED ORDER — SODIUM CHLORIDE 0.9 % IV SOLN
0.5000 mg/h | INTRAVENOUS | Status: DC
  Administered 2019-05-08 (×3): 3 mg/h via INTRAVENOUS
  Filled 2019-05-07 (×2): qty 5

## 2019-05-07 MED ORDER — CHLORHEXIDINE GLUCONATE CLOTH 2 % EX PADS
6.0000 | MEDICATED_PAD | Freq: Every day | CUTANEOUS | Status: DC
  Administered 2019-05-07 – 2019-05-13 (×7): 6 via TOPICAL

## 2019-05-07 MED ORDER — PRISMASOL BGK 0/2.5 32-2.5 MEQ/L IV SOLN
INTRAVENOUS | Status: DC
  Administered 2019-05-07 – 2019-05-11 (×40): via INTRAVENOUS_CENTRAL
  Filled 2019-05-07 (×59): qty 5000

## 2019-05-07 MED ORDER — HEPARIN BOLUS VIA INFUSION (CRRT)
1000.0000 [IU] | INTRAVENOUS | Status: DC | PRN
  Administered 2019-05-07: 1000 [IU] via INTRAVENOUS_CENTRAL
  Filled 2019-05-07 (×2): qty 1000

## 2019-05-07 NOTE — Progress Notes (Signed)
Subjective:  Added heparin to CRRT overnight , now running well -  Removed another 2700-  cvp now 3-4 - K is 5.8, on pressors  Objective Vital signs in last 24 hours: Vitals:   05/07/19 0500 05/07/19 0600 05/07/19 0700 05/07/19 0724  BP: (!) 133/57 (!) 139/56 137/63 137/63  Pulse: 92 88 95 99  Resp: (!) 30 (!) 30 (!) 30 (!) 30  Temp:      TempSrc:      SpO2: 93% 94% 94% 99%  Weight: 118.7 kg     Height:       Weight change: 0 kg  Intake/Output Summary (Last 24 hours) at 05/07/2019 0748 Last data filed at 05/07/2019 0700 Gross per 24 hour  Intake 3954.29 ml  Output 6702 ml  Net -2747.71 ml    Outpt HD:TTS -  [facility] 4.5h F250 124.5kg 400/800 2/2.25 Hep 12,000 AVF - mircera 30 q4 last 10/13 - hect 4   Assessment/ Plan: Pt is a 53 y.o. yo male who was admitted on 04/27/2019 with resp failure due to COVID/pulm edema  Assessment/Plan: 1. Resp failure - remains on vent, had some worsening -- now paralyzed - per CCM. Zosyn/vanc 2. ESRD - normally TTS as OP via TDC. Had reg HD Mon and Tuesday.  Decision made to put on CRRT on Wed in response to him being catabolic with vent difficulties.  Labs still abnormal even on 2 liter dialysate flow-  Continue for now.  K is still up- will change dialysate to 2 K  3. Anemia- hgb dropping- s/p transfusion 11/16 and 11/19-  Increased his ESA- cont supportive care-   4. Secondary hyperparathyroidism- normally on hect 4-- has not been given here-  Calcium corrected is high- on renvela for his phos but getting TF so difficult -  Phos down on CRRT  5. HTN/volume- appearing to be volume overloaded -  CRRT for more effective volume removal - I think is dry enough- will change to keep even   Troy Wells    Labs: Basic Metabolic Panel: Recent Labs  Lab 05/06/19 0432 05/06/19 1600 05/07/19 0249  NA 136 135 137  K 5.5* 5.5* 5.8*  CL 102 98 100  CO2 _0 GLUCOSE 148* 148* 125*  BUN 64* 54* 51*  CREATININE 5.21*  4.23* 3.66*  CALCIUM 9.4 10.0 10.1  PHOS 5.0* 4.9* 5.3*   Liver Function Tests: Recent Labs  Lab 05/06/19 0432 05/06/19 1600 05/07/19 0249  ALBUMIN 2.1* 2.3* 2.3*   No results for input(s): LIPASE, AMYLASE in the last 168 hours. No results for input(s): AMMONIA in the last 168 hours. CBC: Recent Labs  Lab 05/02/19 0527 05/03/19 0354  05/04/19 0500 05/05/19 0353  05/05/19 1600 05/06/19 0432 05/06/19 1533 05/07/19 0249  WBC 7.8 6.0  --  7.1 5.9  --  8.7 6.6  --  7.6  NEUTROABS 5.9 4.0  --   --   --   --   --   --   --   --   HGB 6.8* 5.8*   < > 7.0* 7.6*   < > 7.1* 6.8* 8.8* 8.7*  HCT 20.1* 17.7*   < > 21.2* 23.6*   < > 20.9* 20.8* 26.6* 26.3*  MCV 82.0 84.3  --  85.5 87.1  --  85.3 86.7  --  85.7  PLT 131* 110*  --  132* 173   < > 198 223  --  268   < > =  values in this interval not displayed.   Cardiac Enzymes: No results for input(s): CKTOTAL, CKMB, CKMBINDEX, TROPONINI in the last 168 hours. CBG: Recent Labs  Lab 05/06/19 1252 05/06/19 1555 05/06/19 2015 05/06/19 2318 05/07/19 0304  GLUCAP 106* 119* 112* 116* 96    Iron Studies: No results for input(s): IRON, TIBC, TRANSFERRIN, FERRITIN in the last 72 hours. Studies/Results: No results found. Medications: Infusions: .  prismasol BGK 4/2.5 500 mL/hr at 05/06/19 2016  .  prismasol BGK 4/2.5 300 mL/hr at 05/06/19 2132  . albumin human    . cisatracurium (NIMBEX) infusion 6 mcg/kg/min (05/07/19 0700)  . clevidipine Stopped (05/06/19 2015)  . feeding supplement (VITAL 1.5 CAL) 1,000 mL (05/06/19 2217)  . fentaNYL infusion INTRAVENOUS Stopped (05/05/19 1030)  . heparin 10,000 units/ 20 mL infusion syringe 450 Units/hr (05/07/19 0703)  . heparin    . HYDROmorphone 4 mg/hr (05/07/19 0700)  . levETIRAcetam Stopped (05/06/19 2325)  . midazolam 10 mg/hr (05/07/19 0700)  . norepinephrine (LEVOPHED) Adult infusion 2 mcg/min (05/07/19 0700)  . piperacillin-tazobactam Stopped (05/07/19 0618)  . prismasol BGK 4/2.5  2,000 mL/hr at 05/07/19 0700  . vancomycin Stopped (05/06/19 1448)    Scheduled Medications: . sodium chloride   Intravenous Once  . sodium chloride   Intravenous Once  . alteplase  2 mg Intracatheter Once  . artificial tears  1 application Both Eyes H6P  . aspirin  81 mg Per Tube Daily  . atorvastatin  80 mg Per Tube Daily  . chlorhexidine gluconate (MEDLINE KIT)  15 mL Mouth Rinse BID  . Chlorhexidine Gluconate Cloth  6 each Topical Daily  . darbepoetin (ARANESP) injection - DIALYSIS  200 mcg Intravenous Q Tue-HD  . docusate  100 mg Per Tube BID  . famotidine  20 mg Per Tube Q0600  . feeding supplement (PRO-STAT SUGAR FREE 64)  30 mL Per Tube QID  . heparin injection (subcutaneous)  5,000 Units Subcutaneous Q8H  . insulin aspart  2-6 Units Subcutaneous Q4H  . mouth rinse  15 mL Mouth Rinse 10 times per day  . sevelamer carbonate  0.8 g Oral TID    have reviewed scheduled and prn medications.  Physical Exam: Full PE not done-  I am able to visualize patient in room.  Kept in isolation due to COVID positive status to preserve PPE and to limit exposure to providers as able   05/07/2019,7:48 AM  LOS: 10 days

## 2019-05-07 NOTE — Progress Notes (Signed)
NAME:  Troy Wells, MRN:  [id number], DOB:  09-Sep-1965, LOS: 40 ADMISSION DATE:  04/27/2019, CONSULTATION DATE:  11/10 REFERRING MD:  Johnney Killian, CHIEF COMPLAINT:  Cardiac arrest   Brief History   53 y/o male with ESRD and COVID 19 had a cardiac arrest at dialysis, 12 minutes.  History of present illness   Unable to provide history due to critical illness. History obtained by chart review.  53 year old male with ESRD on HD who presents after syncopal episode and found in PEA arrest. Patient presented to dialysis center and on arrival complained of shortness of breath. He was found pale, diaphoretic and hypoxemic to 71% and bradycardic to 40s. During transport, he went into PEA arrest and received CPR for 13 minutes with epi x1, calcium and 1/2 amp sodium bicarb. King airway was placed in the field. On arrival to the ED, kingairway was exchanged for ETT. PCCM consulted for admission.  Of note, patient had recent admission for COVID-19 pneumonia and treated with oxygen, steroids, remdsivir and decadron. Hospital admission significant for NSTEMI and reduced EF. He was scheduled to follow-up with Cardiology to consider outpatient nuclear test. On discharge, he was able to maintain saturations on room air.   Past Medical History   has a past medical history of CARDIAC ARREST (11/01/2009), Colon polyps (01/02/2016), Coronary artery disease (08/15/2009), Dialysis patient [facility]) (09-27-2014), Diverticulosis of colon without hemorrhage (01/02/2016), DM (diabetes mellitus), type 2 with renal complications ([place]) (7/41/2878), Essential hypertension (11/01/2009), Hyperlipidemia, Hypertension, Mitral valve regurgitation (09/19/2015), OSA treated with BiPAP (06/17/2010), and Sleep apnea.   Significant Hospital Events   11/10 Admitted 11/11Myoclonic activity, HD with 2.2L removed 11/12 Fentanyl100 mcg's. Any stimulation, he has abnormal motor movements, nofollow commands.  11/13 Fentanyl 100 mcg's, doppler  negative, heparin gtt stopped 11/14 PM had severe hypoxemia, flash edema, emergent bronchoscopy frothy secretions 11/16 MRI concerning for anoxic injury 11/17 waking up. Follows commands. Wean 5/5 for several hours. 11/18 decompensation. Back on sedation and full support. Start CRRT  Consults:  PCCM neurology Nephrology  Procedures:  ETT 11/10 >> R IJ CVL 11/14 >  Significant Diagnostic Tests:  CXR 11/10 > Bilateral airspace disease suggestive of volume overload CXR 11/19 > significantly improved aeration, improvement in aeration.  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 Artifact or remote thalamic insults are felt less likely. No other acute intracranial abnormality identified. Chronic right PICA cerebellar infarct   Micro Data:  SARS COV 2 10/28 > positive BCx2 11/10 >> negative  Antimicrobials:  vanc 11/18 -  Zosyn 11/18 -    Interim history/subjective:  Pt intubated and sedated.   Objective   Blood pressure 137/63, pulse 94, temperature 98.5 F (36.9 C), temperature source Oral, resp. rate (!) 30, height 5\' 10"  (1.778 m), weight 118.7 kg, SpO2 96 %. CVP:  [3 mmHg-10 mmHg] 10 mmHg  Vent Mode: PRVC FiO2 (%):  [40 %] 40 % Set Rate:  [30 bmp] 30 bmp Vt Set:  [430 mL] 430 mL PEEP:  [8 cmH20-10 cmH20] 8 cmH20 Plateau Pressure:  [26 cmH20-32 cmH20] 29 cmH20   Intake/Output Summary (Last  24 hours) at 05/07/2019 0857 Last data filed at 05/07/2019 0800 Gross per 24 hour  Intake 3881.86 ml  Output 6784 ml  Net -2902.14 ml    Filed Weights   05/05/19 0350 05/06/19 0436 05/07/19 0500  Weight: 120.9 kg 118.7 kg 118.7 kg    Examination: General: intubated, sedated.   HENT: moist oral mucosa. No serosanguinous secretions seen today.  Lungs: on ventilator. Rales/ronchi heard, right > left.    Cardiovascular: regular rhythm. Normal rate.   Abdomen: distended. Normal bowel sounds.  Extremities: no edema.  GU: foley in place.  Neuro: sedated.  Does not respond to commands. Patient grimaces when gag reflex stimulated.   Resolved Hospital Problem list     Assessment & Plan:  ASSESSMENT / PLAN:  Acute encephalopathic secondary anoxic brain injury, sepsis Sedation for mechanical ventilation PAD protocol for RASS goal -4 while on paralytic protocol Versed and Dilaudid gtt - continue keppra  Acute respiratory failure/ARDS +/- flash pulmonary edema - CXR on 11/20 improved from previous.   Full vent support ARDS protocol Volume removal with CRRT per Nephrology Antibiotic management as below  COVID-19 pneumonia +/- comitant bacterial pneumonia. Trach asp with few GNR Continue Vanc and Zosyn. Plan to DC Vanc after 48 hours if culture neg  Follow-up trach aspirate If patient re-fevers, will repeat blood cultures  Hypertensive emergency - resolved, now on levophed Arrhythmia - seen on telemetry. normal rhythm on EKG performed last night.   Acute systolic and diastolic heart failure D/c'd Cleviprex gtt Continue on Levophed for hypotension Diureses via CRRT Monitor for arrhythmia  Acute blood loss anemia - likely iatrogenic, critically ill.  S/p 1 U pRBC. hgb 8.7 currently.  - Resume heparin prophylaxis  ESRD Secondary hyperparathyroidism Hyperkalemia  On CRRT currently.  Appreciate Nephrology involvement Continue to monitor renal function/electrolytes daily.  renvela TID  Best practice:  Diet: tube feeds Pain/Anxiety/Delirium protocol (if indicated): as above VAP protocol (if indicated): yes  DVT prophylaxis: sub Q hep per protocol GI prophylaxis: famotidine Glucose control: SSI Mobility: bed rest Code Status: full Family Communication: continue to update daughter.  Disposition: ICU  Labs   CBC: Recent Labs  Lab 05/02/19 0527 05/03/19 0354  05/04/19 0500 05/05/19 0353  05/05/19 0934  05/05/19 1228 05/05/19 1600 05/06/19 0432 05/06/19 1533 05/07/19 0249  WBC 7.8 6.0  --  7.1 5.9  --   --   --   --  8.7 6.6  --  7.6  NEUTROABS 5.9 4.0  --   --   --   --   --   --   --   --   --   --   --   HGB 6.8* 5.8*   < > 7.0* 7.6*   < >  --    < > 6.1* 7.1* 6.8* 8.8* 8.7*  HCT 20.1* 17.7*   < > 21.2* 23.6*   < >  --    < > 18.0* 20.9* 20.8* 26.6* 26.3*  MCV 82.0 84.3  --  85.5 87.1  --   --   --   --  85.3 86.7  --  85.7  PLT 131* 110*  --  132* 173  --  196  --   --  198 223  --  268   < > = values in this interval not displayed.    Basic Metabolic Panel: Recent Labs  Lab 05/04/19 0500 05/05/19 0353  05/05/19 1228 05/05/19 1600 05/06/19 0432 05/06/19 1600 05/07/19 0249  NA 138 138   < > 134* 136 136 135 137  K 5.3* 5.8*   < > 6.1* 5.8* 5.5* 5.5* 5.8*  CL 97* 96*  --   --  100 102 98 100  CO2 26 27  --   --  24 24 23 22   GLUCOSE 111* 113*  --   --  148* 148* 148* 125*  BUN 77* 80*  --   --  78* 64* 54* 51*  CREATININE 8.18* 8.24*  --   --  6.59* 5.21* 4.23* 3.66*  CALCIUM 9.1 9.7  --   --  9.3 9.4 10.0 10.1  MG 2.4 2.4  --   --   --  2.5*  --  2.6*  PHOS 6.5* 8.2*  --   --  5.9* 5.0* 4.9* 5.3*   < > = values in this interval not displayed.   GFR: Estimated Creatinine Clearance: 29.8 mL/min (A) (by C-G formula based on SCr of 3.66 mg/dL (H)). Recent Labs  Lab 05/05/19 0353 05/05/19 1600 05/06/19 0432 05/07/19 0249  WBC 5.9 8.7 6.6 7.6    Liver Function Tests: Recent Labs  Lab 05/05/19 1600 05/06/19 0432 05/06/19 1600 05/07/19 0249  ALBUMIN 2.1* 2.1* 2.3* 2.3*   No results for input(s): LIPASE, AMYLASE in the last 168 hours. No results for  input(s): AMMONIA in the last 168 hours.  ABG    Component Value Date/Time   PHART 7.347 (L) 05/05/2019 1228   PCO2ART 51.9 (H) 05/05/2019 1228   PO2ART 277.0 (H) 05/05/2019 1228   HCO3 28.4 (H) 05/05/2019 1228   TCO2 30 05/05/2019 1228   ACIDBASEDEF 9.0 (H) 05/01/2019 1501   O2SAT 100.0 05/05/2019 1228     Coagulation Profile: Recent Labs  Lab 05/05/19 0934  INR 1.1    Cardiac Enzymes: No results for input(s): CKTOTAL, CKMB, CKMBINDEX, TROPONINI in the last 168 hours.  HbA1C: Hemoglobin-A1c  Date/Time Value Ref Range Status  09/14/2009 09:43 AM 8.5 (H) 5.4 - 7.4 % Final   Hemoglobin A1C  Date/Time Value Ref Range Status  11/21/2016 02:33 PM 5.3  Final  04/02/2016 10:05 AM 6.1  Final   HbA1c, POC (controlled diabetic range)  Date/Time Value Ref Range Status  10/21/2018 11:25 AM 5.7 0.0 - 7.0 % Final   Hgb A1c MFr Bld  Date/Time Value Ref Range Status  04/14/2019 02:00 AM 5.8 (H) 4.8 - 5.6 % Final    Comment:    (NOTE) Pre diabetes:          5.7%-6.4% Diabetes:              >6.4% Glycemic control for   <7.0% adults with diabetes     CBG: Recent Labs  Lab 05/06/19 1555 05/06/19 2015 05/06/19 2318 05/07/19 0304 05/07/19 0816  GLUCAP 119* 112* 116* 96 119*    Review of Systems:   Unable to assess.    Critical care time: 20

## 2019-05-08 DIAGNOSIS — G931 Anoxic brain damage, not elsewhere classified: Secondary | ICD-10-CM | POA: Diagnosis not present

## 2019-05-08 DIAGNOSIS — J80 Acute respiratory distress syndrome: Secondary | ICD-10-CM | POA: Diagnosis not present

## 2019-05-08 DIAGNOSIS — I469 Cardiac arrest, cause unspecified: Secondary | ICD-10-CM | POA: Diagnosis not present

## 2019-05-08 DIAGNOSIS — U071 COVID-19: Secondary | ICD-10-CM | POA: Diagnosis not present

## 2019-05-08 LAB — POCT ACTIVATED CLOTTING TIME
Activated Clotting Time: 180 seconds
Activated Clotting Time: 186 seconds
Activated Clotting Time: 186 seconds
Activated Clotting Time: 186 seconds
Activated Clotting Time: 186 seconds
Activated Clotting Time: 186 seconds
Activated Clotting Time: 186 seconds
Activated Clotting Time: 191 seconds
Activated Clotting Time: 197 seconds
Activated Clotting Time: 197 seconds
Activated Clotting Time: 197 seconds
Activated Clotting Time: 202 seconds
Activated Clotting Time: 213 seconds
Activated Clotting Time: 224 seconds
Activated Clotting Time: 224 seconds

## 2019-05-08 LAB — CBC
HCT: 26.2 % — ABNORMAL LOW (ref 39.0–52.0)
Hemoglobin: 8.3 g/dL — ABNORMAL LOW (ref 13.0–17.0)
MCH: 27.9 pg (ref 26.0–34.0)
MCHC: 31.7 g/dL (ref 30.0–36.0)
MCV: 88.2 fL (ref 80.0–100.0)
Platelets: 372 10*3/uL (ref 150–400)
RBC: 2.97 MIL/uL — ABNORMAL LOW (ref 4.22–5.81)
RDW: 17.5 % — ABNORMAL HIGH (ref 11.5–15.5)
WBC: 9.5 10*3/uL (ref 4.0–10.5)
nRBC: 0.4 % — ABNORMAL HIGH (ref 0.0–0.2)

## 2019-05-08 LAB — RENAL FUNCTION PANEL
Albumin: 2.2 g/dL — ABNORMAL LOW (ref 3.5–5.0)
Albumin: 2.3 g/dL — ABNORMAL LOW (ref 3.5–5.0)
Anion gap: 11 (ref 5–15)
Anion gap: 9 (ref 5–15)
BUN: 45 mg/dL — ABNORMAL HIGH (ref 6–20)
BUN: 42 mg/dL — ABNORMAL HIGH (ref 6–20)
CO2: 23 mmol/L (ref 22–32)
CO2: 27 mmol/L (ref 22–32)
Calcium: 10.2 mg/dL (ref 8.9–10.3)
Calcium: 10.6 mg/dL — ABNORMAL HIGH (ref 8.9–10.3)
Chloride: 100 mmol/L (ref 98–111)
Chloride: 99 mmol/L (ref 98–111)
Creatinine, Ser: 3.19 mg/dL — ABNORMAL HIGH (ref 0.61–1.24)
Creatinine, Ser: 2.93 mg/dL — ABNORMAL HIGH (ref 0.61–1.24)
GFR calc Af Amer: 24 mL/min — ABNORMAL LOW (ref >60)
GFR calc Af Amer: 27 mL/min — ABNORMAL LOW (ref >60)
GFR calc non Af Amer: 21 mL/min — ABNORMAL LOW (ref >60)
GFR calc non Af Amer: 23 mL/min — ABNORMAL LOW (ref >60)
Glucose, Bld: 220 mg/dL — ABNORMAL HIGH (ref 70–99)
Glucose, Bld: 189 mg/dL — ABNORMAL HIGH (ref 70–99)
Phosphorus: 4.7 mg/dL — ABNORMAL HIGH (ref 2.5–4.6)
Phosphorus: 4.6 mg/dL (ref 2.5–4.6)
Potassium: 4.8 mmol/L (ref 3.5–5.1)
Potassium: 5 mmol/L (ref 3.5–5.1)
Sodium: 134 mmol/L — ABNORMAL LOW (ref 135–145)
Sodium: 135 mmol/L (ref 135–145)

## 2019-05-08 LAB — GLUCOSE, CAPILLARY
Glucose-Capillary: 220 mg/dL — ABNORMAL HIGH (ref 70–99)
Glucose-Capillary: 210 mg/dL — ABNORMAL HIGH (ref 70–99)
Glucose-Capillary: 179 mg/dL — ABNORMAL HIGH (ref 70–99)
Glucose-Capillary: 173 mg/dL — ABNORMAL HIGH (ref 70–99)
Glucose-Capillary: 153 mg/dL — ABNORMAL HIGH (ref 70–99)

## 2019-05-08 LAB — APTT: aPTT: 156 seconds — ABNORMAL HIGH (ref 24–36)

## 2019-05-08 LAB — MAGNESIUM: Magnesium: 2.8 mg/dL — ABNORMAL HIGH (ref 1.7–2.4)

## 2019-05-08 NOTE — Progress Notes (Signed)
Daughter called over the phone and updated for 20 minutes and all questions answered.  Rush Farmer, M.D. [facility] Pulmonary/Critical Care Medicine.

## 2019-05-08 NOTE — Progress Notes (Signed)
NAME:  Troy Wells, MRN:  [id number], DOB:  [birth year], LOS: 71 ADMISSION DATE:  04/27/2019, CONSULTATION DATE:  11/10 REFERRING MD:  Johnney Killian, CHIEF COMPLAINT:  Cardiac arrest   Brief History   53 y/o male with ESRD and COVID 19 had a cardiac arrest at dialysis, 12 minutes.  History of present illness   Unable to provide history due to critical illness. History obtained by chart review.  53 year old male with ESRD on HD who presents after syncopal episode and found in PEA arrest. Patient presented to dialysis center and on arrival complained of shortness of breath. He was found pale, diaphoretic and hypoxemic to 71% and bradycardic to 40s. During transport, he went into PEA arrest and received CPR for 13 minutes with epi x1, calcium and 1/2 amp sodium bicarb. King airway was placed in the field. On arrival to the ED, kingairway was exchanged for ETT. PCCM consulted for admission.  Of note, patient had recent admission for COVID-19 pneumonia and treated with oxygen, steroids, remdsivir and decadron. Hospital admission significant for NSTEMI and reduced EF. He was scheduled to follow-up with Cardiology to consider outpatient nuclear test. On discharge, he was able to maintain saturations on room air.   Past Medical History   has a past medical history of CARDIAC ARREST (11/01/2009), Colon polyps (01/02/2016), Coronary artery disease (08/15/2009), Dialysis patient [facility]) (09-27-2014), Diverticulosis of colon without hemorrhage (01/02/2016), DM (diabetes mellitus), type 2 with renal complications ([place]) (09/23/8117), Essential hypertension (11/01/2009), Hyperlipidemia, Hypertension, Mitral valve regurgitation (09/19/2015), OSA treated with BiPAP (06/17/2010), and Sleep apnea.   Significant Hospital Events   11/10 Admitted 11/11Myoclonic activity, HD with 2.2L removed 11/12 Fentanyl100 mcg's. Any stimulation, he has abnormal motor movements, nofollow commands.  11/13 Fentanyl 100 mcg's, doppler  negative, heparin gtt stopped 11/14 PM had severe hypoxemia, flash edema, emergent bronchoscopy frothy secretions 11/16 MRI concerning for anoxic injury 11/17 waking up. Follows commands. Wean 5/5 for several hours. 11/18 decompensation. Back on sedation and full support. Start CRRT  Consults:  PCCM neurology Nephrology  Procedures:  ETT 11/10 >> R IJ CVL 11/14 >  Significant Diagnostic Tests:  CXR 11/10 > Bilateral airspace disease suggestive of volume overload CXR 11/19 > significantly improved aeration, improvement in aeration.  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 Artifact or remote thalamic insults are felt less likely. No other acute intracranial abnormality identified. Chronic right PICA cerebellar infarct   Micro Data:  SARS COV 2 10/28 > positive BCx2 11/10 >> negative  Antimicrobials:  Vanc 11/18 -  Zosyn 11/18 -    Interim history/subjective:  Sedated and intubated No events overnight  Objective   Blood pressure (!) 167/74, pulse 94, temperature 97.8 F (36.6 C), temperature source Oral, resp. rate (!) 30, height 5\' 10"  (1.778 m), weight 111.8 kg, SpO2 100 %. CVP:  [2 mmHg-13 mmHg] 9 mmHg  Vent Mode: PRVC FiO2 (%):  [40 %] 40 % Set Rate:  [30 bmp] 30 bmp Vt Set:  [430 mL] 430 mL PEEP:  [5 cmH20] 5 cmH20 Plateau Pressure:  [22 cmH20] 22 cmH20   Intake/Output Summary (Last  24 hours) at 05/08/2019 2947 Last data filed at 05/08/2019 0800 Gross per 24 hour  Intake 2480.53 ml  Output 3011 ml  Net -530.47 ml    Filed Weights   05/06/19 0436 05/07/19 0500 05/08/19 0444  Weight: 118.7 kg 118.7 kg 111.8 kg    Examination: General: Sedated and intubated, acute on chronically ill appearing male, NAD HENT: /AT, PERRL, EOM-I and MMM, ETT in place Lungs: Coarse BS diffusely Cardiovascular: RRR, Nl S1/S2 and -M/R/G Abdomen: Soft, distended, NT and +BS Extremities: -edema and -tenderness GU: foley in place.  Neuro: sedated.  Does not respond to commands. Patient grimaces when gag reflex stimulated.   I reviewed CXR myself, ETT is in a good position  Resolved Hospital Problem list     Assessment & Plan:  ASSESSMENT / PLAN:  Acute encephalopathic secondary anoxic brain injury, sepsis Sedation for mechanical ventilation PAD protocol for RASS goal -4 while on paralytic protocol Versed and Dilaudid gtt - Maintain on keppra and versed - Dilaudid drip  Acute respiratory failure/ARDS +/- flash pulmonary edema - CXR on 11/20 improved from previous.   - Full vent support, no weaning given mental status - ARDS protocol - CRRT volume negative - Abx as above - Not a trach candidate given neuro status  COVID-19 pneumonia +/- comitant bacterial pneumonia. Trach asp with few GNR Continue Vanc and Zosyn Follow-up trach aspirate If patient re-fevers, will repeat blood cultures  Hypertensive emergency - resolved, now on levophed Arrhythmia - seen on telemetry. normal rhythm on EKG performed last night.   Acute systolic and diastolic heart failure Levophed for BP support Diureses via CRRT Monitor for arrhythmia  Acute blood loss anemia - likely iatrogenic, critically ill.  S/p 1 U pRBC. hgb 8.7 currently.  - Resume heparin prophylaxis  ESRD Secondary hyperparathyroidism Hyperkalemia  On CRRT currently.  Appreciate Nephrology involvement Continue to monitor renal function/electrolytes daily.  renvela TID  Family is not really understanding the severity of the patient's disease,  neurology involved, will consult palliative care to speak with the family on Monday.  Best practice:  Diet: tube feeds Pain/Anxiety/Delirium protocol (if indicated): as above VAP protocol (if indicated): yes DVT prophylaxis: sub Q hep per protocol GI prophylaxis: famotidine Glucose control: SSI Mobility: bed rest Code Status: full Family Communication: continue to update daughter.  Disposition: ICU  Labs   CBC: Recent Labs  Lab 05/02/19 0527 05/03/19 0354  05/05/19 0353  05/05/19 0934  05/05/19 1600 05/06/19 0432 05/06/19 1533 05/07/19 0249 05/08/19 0405  WBC 7.8 6.0   < > 5.9  --   --   --  8.7 6.6  --  7.6 9.5  NEUTROABS 5.9 4.0  --   --   --   --   --   --   --   --   --   --   HGB 6.8* 5.8*   < > 7.6*   < >  --    < > 7.1* 6.8* 8.8* 8.7* 8.3*  HCT 20.1* 17.7*   < > 23.6*   < >  --    < > 20.9* 20.8* 26.6* 26.3* 26.2*  MCV 82.0 84.3   < > 87.1  --   --   --  85.3 86.7  --  85.7 88.2  PLT 131* 110*   < > 173  --  196  --  198 223  --  268 372   < > = values in this interval not displayed.  Basic Metabolic Panel: Recent Labs  Lab 05/04/19 0500 05/05/19 0353  05/06/19 0432 05/06/19 1600 05/07/19 0249 05/07/19 1735 05/08/19 0405  NA 138 138   < > 136 135 137 134* 134*  K 5.3* 5.8*   < > 5.5* 5.5* 5.8* 5.4* 4.8  CL 97* 96*   < > 102 98 100 100 100  CO2 26 27   < > 24 23 22 22 23   GLUCOSE 111* 113*   < > 148* 148* 125* 237* 220*  BUN 77* 80*   < > 64* 54* 51* 48* 45*  CREATININE 8.18* 8.24*   < > 5.21* 4.23* 3.66* 3.62* 3.19*  CALCIUM 9.1 9.7   < > 9.4 10.0 10.1 9.7 10.2  MG 2.4 2.4  --  2.5*  --  2.6*  --  2.8*  PHOS 6.5* 8.2*   < > 5.0* 4.9* 5.3* 4.9* 4.7*   < > = values in this interval not displayed.   GFR: Estimated Creatinine Clearance: 33.1 mL/min (A) (by C-G formula based on SCr of 3.19 mg/dL (H)). Recent Labs  Lab 05/05/19 1600 05/06/19 0432 05/07/19 0249 05/08/19 0405  WBC 8.7 6.6 7.6 9.5    Liver Function Tests: Recent Labs  Lab  05/06/19 0432 05/06/19 1600 05/07/19 0249 05/07/19 1735 05/08/19 0405  ALBUMIN 2.1* 2.3* 2.3* 2.2* 2.2*   No results for input(s): LIPASE, AMYLASE in the last 168 hours. No results for input(s): AMMONIA in the last 168 hours.  ABG    Component Value Date/Time   PHART 7.347 (L) 05/05/2019 1228   PCO2ART 51.9 (H) 05/05/2019 1228   PO2ART 277.0 (H) 05/05/2019 1228   HCO3 28.4 (H) 05/05/2019 1228   TCO2 30 05/05/2019 1228   ACIDBASEDEF 9.0 (H) 05/01/2019 1501   O2SAT 100.0 05/05/2019 1228     Coagulation Profile: Recent Labs  Lab 05/05/19 0934  INR 1.1    Cardiac Enzymes: No results for input(s): CKTOTAL, CKMB, CKMBINDEX, TROPONINI in the last 168 hours.  HbA1C: Hemoglobin-A1c  Date/Time Value Ref Range Status  09/14/2009 09:43 AM 8.5 (H) 5.4 - 7.4 % Final   Hemoglobin A1C  Date/Time Value Ref Range Status  11/21/2016 02:33 PM 5.3  Final  04/02/2016 10:05 AM 6.1  Final   HbA1c, POC (controlled diabetic range)  Date/Time Value Ref Range Status  10/21/2018 11:25 AM 5.7 0.0 - 7.0 % Final   Hgb A1c MFr Bld  Date/Time Value Ref Range Status  04/14/2019 02:00 AM 5.8 (H) 4.8 - 5.6 % Final    Comment:    (NOTE) Pre diabetes:          5.7%-6.4% Diabetes:              >6.4% Glycemic control for   <7.0% adults with diabetes     CBG: Recent Labs  Lab 05/07/19 1541 05/07/19 1946 05/07/19 2303 05/08/19 0305 05/08/19 0753  GLUCAP 142* 126* 214* 220* 210*   The patient is critically ill with multiple organ systems failure and requires high complexity decision making for assessment and support, frequent evaluation and titration of therapies, application of advanced monitoring technologies and extensive interpretation of multiple databases.   Critical Care Time devoted to patient care services described in this note is  32  Minutes. This time reflects time of care of this signee Dr Jennet Maduro. This critical care time does not reflect procedure time, or teaching time  or supervisory time of PA/NP/Med student/Med Resident etc but could involve care discussion time.  Rush Farmer, M.D. [facility] Pulmonary/Critical Care Medicine.

## 2019-05-08 NOTE — Progress Notes (Signed)
Subjective:  BP seemed to increase yesterday for unclear reasons-  Off pressors- had backed off on UF-  Minus 600 yest total due to CVP 3-4 -  Now is 9-12  Objective Vital signs in last 24 hours: Vitals:   05/08/19 0400 05/08/19 0444 05/08/19 0500 05/08/19 0600  BP: (!) 168/75  (!) 159/68 (!) 154/72  Pulse: 95  99 98  Resp: (!) 30  (!) 30 (!) 30  Temp:      TempSrc:      SpO2: 100%  100% 99%  Weight:  111.8 kg    Height:       Weight change: -6.9 kg  Intake/Output Summary (Last 24 hours) at 05/08/2019 4098 Last data filed at 05/08/2019 0600 Gross per 24 hour  Intake 2750.77 ml  Output 3429 ml  Net -678.23 ml    Outpt HD:TTS -  [facility] 4.5h F250 124.5kg 400/800 2/2.25 Hep 12,000 AVF - mircera 30 q4 last 10/13 - hect 4   Assessment/ Plan: Pt is a 53 y.o. yo male with ESRD who was admitted on 04/27/2019 with resp failure due to COVID/pulm edema and cardiac arrest  Assessment/Plan: 1. Resp failure - remains on vent, had some worsening -- previously paralyzed, now just on sedation - per CCM.  Now abx include rocephin  2. ESRD - normally TTS as OP via TDC. Had reg HD Mon and Tuesday.  Decision made to put on CRRT on Wed in response to him being catabolic with vent difficulties.  Labs improving- still is tenuous and catabolic so will continue for now.  K is finally better 3. Anemia- hgb dropping- s/p transfusion 11/16 and 11/19-  Increased his ESA- cont supportive care-   4. Secondary hyperparathyroidism- normally on hect 4-- has not been given here-  Calcium corrected is high- on renvela for his phos but getting TF so difficult -  Phos down on CRRT  5. HTN/volume- appearing to be volume overloaded -  CRRT for more effective volume removal - I think is dry enough- will change to keep even changed to keeping even yesterday but will start pulling some again today  6. Question of anoxic brain injury-  Complicating factor   Louis Meckel    Labs: Basic  Metabolic Panel: Recent Labs  Lab 05/07/19 0249 05/07/19 1735 05/08/19 0405  NA 137 134* 134*  K 5.8* 5.4* 4.8  CL 100 100 100  CO2 '22 22 23  ' GLUCOSE 125* 237* 220*  BUN 51* 48* 45*  CREATININE 3.66* 3.62* 3.19*  CALCIUM 10.1 9.7 10.2  PHOS 5.3* 4.9* 4.7*   Liver Function Tests: Recent Labs  Lab 05/07/19 0249 05/07/19 1735 05/08/19 0405  ALBUMIN 2.3* 2.2* 2.2*   No results for input(s): LIPASE, AMYLASE in the last 168 hours. No results for input(s): AMMONIA in the last 168 hours. CBC: Recent Labs  Lab 05/02/19 0527 05/03/19 0354  05/05/19 0353  05/05/19 1600 05/06/19 0432 05/06/19 1533 05/07/19 0249 05/08/19 0405  WBC 7.8 6.0   < > 5.9  --  8.7 6.6  --  7.6 9.5  NEUTROABS 5.9 4.0  --   --   --   --   --   --   --   --   HGB 6.8* 5.8*   < > 7.6*   < > 7.1* 6.8* 8.8* 8.7* 8.3*  HCT 20.1* 17.7*   < > 23.6*   < > 20.9* 20.8* 26.6* 26.3* 26.2*  MCV 82.0 84.3   < >  87.1  --  85.3 86.7  --  85.7 88.2  PLT 131* 110*   < > 173   < > 198 223  --  268 372   < > = values in this interval not displayed.   Cardiac Enzymes: No results for input(s): CKTOTAL, CKMB, CKMBINDEX, TROPONINI in the last 168 hours. CBG: Recent Labs  Lab 05/07/19 1123 05/07/19 1541 05/07/19 1946 05/07/19 2303 05/08/19 0305  GLUCAP 104* 142* 126* 214* 220*    Iron Studies: No results for input(s): IRON, TIBC, TRANSFERRIN, FERRITIN in the last 72 hours. Studies/Results: Dg Chest Port 1 View  Result Date: 05/07/2019 CLINICAL DATA:  Ventilator dependent EXAM: PORTABLE CHEST 1 VIEW COMPARISON:  Two days ago FINDINGS: Endotracheal tube tip at the clavicular heads. The orogastric tube at least reaches the stomach. Right IJ line with tip at the SVC. Significant improvement in aeration, especially on the left. There is still a extensive amount of airspace disease densest at the right base. Suspect layering pleural fluid on the right. Cardiomegaly accentuated by rotation IMPRESSION: 1. Unremarkable  hardware positioning. 2. Significant improvement in aeration, rapid change favoring improvement of edema. 3. Probable layering pleural effusion on the right. Electronically Signed   By: Monte Fantasia M.D.   On: 05/07/2019 08:02   Medications: Infusions: .  prismasol BGK 4/2.5 500 mL/hr at 05/07/19 2111  .  prismasol BGK 4/2.5 300 mL/hr at 05/07/19 1553  . albumin human    . cefTRIAXone (ROCEPHIN)  IV Stopped (05/07/19 1453)  . feeding supplement (VITAL 1.5 CAL) 1,000 mL (05/08/19 0358)  . heparin 10,000 units/ 20 mL infusion syringe 1,750 Units/hr (05/08/19 0446)  . heparin    . HYDROmorphone 3 mg/hr (05/08/19 0600)  . levETIRAcetam Stopped (05/08/19 0007)  . midazolam 10 mg/hr (05/08/19 0600)  . norepinephrine (LEVOPHED) Adult infusion Stopped (05/07/19 0722)  . prismasol BGK 2/2.5 dialysis solution 2,000 mL/hr at 05/08/19 0453    Scheduled Medications: . sodium chloride   Intravenous Once  . sodium chloride   Intravenous Once  . alteplase  2 mg Intracatheter Once  . aspirin  324 mg Per Tube Daily  . atorvastatin  80 mg Per Tube Daily  . chlorhexidine gluconate (MEDLINE KIT)  15 mL Mouth Rinse BID  . Chlorhexidine Gluconate Cloth  6 each Topical Daily  . darbepoetin (ARANESP) injection - DIALYSIS  200 mcg Intravenous Q Tue-HD  . docusate  100 mg Per Tube BID  . famotidine  20 mg Per Tube Q0600  . feeding supplement (PRO-STAT SUGAR FREE 64)  30 mL Per Tube QID  . heparin injection (subcutaneous)  5,000 Units Subcutaneous Q8H  . insulin aspart  2-6 Units Subcutaneous Q4H  . mouth rinse  15 mL Mouth Rinse 10 times per day  . sevelamer carbonate  0.8 g Oral TID    have reviewed scheduled and prn medications.  Physical Exam: Full PE not done-  I am able to visualize patient in room.  Kept in isolation due to COVID positive status to preserve PPE and to limit exposure to providers as able   05/08/2019,6:29 AM  LOS: 11 days

## 2019-05-08 NOTE — Progress Notes (Signed)
Subjective: Eyes open but unresponsive to commands. On Versed gtt at 10 mg/hr. Also on Dilaudid gtt.   Objective: Current vital signs: BP (!) 152/67   Pulse 85   Temp 97.8 F (36.6 C) (Oral)   Resp (!) 30   Ht _0  (1.778 m)   Wt 111.8 kg   SpO2 100%   BMI 35.37 kg/m  Vital signs in last 24 hours: Temp:  [97.7 F (36.5 C)-98.4 F (36.9 C)] 97.8 F (36.6 C) (11/21 0800) Pulse Rate:  [85-115] 85 (11/21 1300) Resp:  [20-30] 30 (11/21 1300) BP: (139-180)/(55-106) 152/67 (11/21 1300) SpO2:  [94 %-100 %] 100 % (11/21 1300) Arterial Line BP: (140-177)/(49-70) 145/53 (11/21 1300) FiO2 (%):  [40 %] 40 % (11/21 0800) Weight:  [111.8 kg] 111.8 kg (11/21 0444)  Intake/Output from previous day: 11/20 0701 - 11/21 0700 In: 2585.4 [P.O.:80; I.V.:415.3; NG/GT:1575; IV Piggyback:515.2] Out: 3383  Intake/Output this shift: Total I/O In: 728.7 [I.V.:138.7; NG/GT:390; IV Piggyback:200] Out: 1305 [Other:1105; Stool:200] Nutritional status:  Diet Order            Diet NPO time specified  Diet effective now             HEENT: [place]/AT Lungs: Intubated Ext: Warm and well perfused  Neurologic Exam: Ment: Awake with eyes open but unresponsive to voice. Will slowly turn head slightly towards repeated calling of his name, but also seems to turn head towards repeated calling out of the number "five". No purposeful movement of limbs.  CN: PERRL. No blink to threat. Does not gaze towards or away from visual stimuli. Eyes midline. No nystagmus. Doll's eye reflex is suppressed. Face flaccidly symmetric. Head preferentially turned towards the left.  Motor: Decreased tone all 4 extremities.  Will withdraw as well as semipurposefully move BUE slightly to pinch in each extremity; moves RUE less than LUE. Strength rated as 2/5.  Will flex BLE slightly to pinch with 1-2/5 strength.  Reflexes: Brisk low-amplitude biceps, brachioradialis and patellae.  Lab Results: Results for orders placed or  performed during the hospital encounter of 04/27/19 (from the past 48 hour(s))  Hemoglobin and hematocrit, blood     Status: Abnormal   Collection Time: 05/06/19  3:33 PM  Result Value Ref Range   Hemoglobin 8.8 (L) 13.0 - 17.0 g/dL    Comment: REPEATED TO VERIFY POST TRANSFUSION SPECIMEN    HCT 26.6 (L) 39.0 - 52.0 %    Comment: Performed at [facility] [address] [place], [place] [address]  Glucose, capillary     Status: Abnormal   Collection Time: 05/06/19  3:55 PM  Result Value Ref Range   Glucose-Capillary 119 (H) 70 - 99 mg/dL  Renal function panel (daily at 1600)     Status: Abnormal   Collection Time: 05/06/19  4:00 PM  Result Value Ref Range   Sodium 135 135 - 145 mmol/L   Potassium 5.5 (H) 3.5 - 5.1 mmol/L   Chloride 98 98 - 111 mmol/L   CO2 23 22 - 32 mmol/L   Glucose, Bld 148 (H) 70 - 99 mg/dL   BUN 54 (H) 6 - 20 mg/dL   Creatinine, Ser 4.23 (H) 0.61 - 1.24 mg/dL   Calcium 10.0 8.9 - 10.3 mg/dL   Phosphorus 4.9 (H) 2.5 - 4.6 mg/dL   Albumin 2.3 (L) 3.5 - 5.0 g/dL   GFR calc non Af Amer 15 (L) >60 mL/min   GFR calc Af Amer 17 (L) >60 mL/min  Anion gap 14 5 - 15    Comment: Performed at [facility] [address], [place], [place] [address]  Glucose, capillary     Status: Abnormal   Collection Time: 05/06/19  8:15 PM  Result Value Ref Range   Glucose-Capillary 112 (H) 70 - 99 mg/dL  Glucose, capillary     Status: Abnormal   Collection Time: 05/06/19 11:18 PM  Result Value Ref Range   Glucose-Capillary 116 (H) 70 - 99 mg/dL  CBC     Status: Abnormal   Collection Time: 05/07/19  2:49 AM  Result Value Ref Range   WBC 7.6 4.0 - 10.5 K/uL   RBC 3.07 (L) 4.22 - 5.81 MIL/uL   Hemoglobin 8.7 (L) 13.0 - 17.0 g/dL   HCT 26.3 (L) 39.0 - 52.0 %   MCV 85.7 80.0 - 100.0 fL   MCH 28.3 26.0 - 34.0 pg   MCHC 33.1 30.0 - 36.0 g/dL   RDW 17.2 (H) 11.5 - 15.5 %   Platelets 268 150 - 400 K/uL   nRBC 0.0 0.0 - 0.2 %    Comment: Performed at [facility], [place] [address], [place], [place] [address]  Renal function panel (daily at 0500)     Status: Abnormal   Collection Time: 05/07/19  2:49 AM  Result Value Ref Range   Sodium 137 135 - 145 mmol/L   Potassium 5.8 (H) 3.5 - 5.1 mmol/L   Chloride 100 98 - 111 mmol/L   CO2 22 22 - 32 mmol/L   Glucose, Bld 125 (H) 70 - 99 mg/dL   BUN 51 (H) 6 - 20 mg/dL   Creatinine, Ser 3.66 (H) 0.61 - 1.24 mg/dL   Calcium 10.1 8.9 - 10.3 mg/dL   Phosphorus 5.3 (H) 2.5 - 4.6 mg/dL   Albumin 2.3 (L) 3.5 - 5.0 g/dL   GFR calc non Af Amer 18 (L) >60 mL/min   GFR calc Af Amer 21 (L) >60 mL/min   Anion gap 15 5 - 15    Comment: Performed at [facility], [address], [place], [place] [address]  Magnesium     Status: Abnormal   Collection Time: 05/07/19  2:49 AM  Result Value Ref Range   Magnesium 2.6 (H) 1.7 - 2.4 mg/dL    Comment: Performed at [facility] [address] [place], [place] [address]  Triglycerides     Status: Abnormal   Collection Time: 05/07/19  2:49 AM  Result Value Ref Range   Triglycerides 241 (H) <150 mg/dL    Comment: Performed at [facility] [address] [place], [place] [address]  Glucose, capillary     Status: None   Collection Time: 05/07/19  3:04 AM  Result Value Ref Range   Glucose-Capillary 96 70 - 99 mg/dL  APTT     Status: Abnormal   Collection Time: 05/07/19  4:01 AM  Result Value Ref Range   aPTT 43 (H) 24 - 36 seconds    Comment:        IF BASELINE aPTT IS ELEVATED, SUGGEST PATIENT RISK ASSESSMENT BE USED TO DETERMINE APPROPRIATE ANTICOAGULANT THERAPY. Performed at [facility], [place] [address], [place], [place] [address]   POCT Activated clotting time     Status: None   Collection Time: 05/07/19  4:58 AM  Result Value Ref Range   Activated Clotting Time 147 seconds  POCT Activated clotting time  Status: None   Collection Time: 05/07/19  6:07 AM  Result Value Ref Range   Activated Clotting Time 153 seconds  POCT  Activated clotting time     Status: None   Collection Time: 05/07/19  7:00 AM  Result Value Ref Range   Activated Clotting Time 153 seconds  Glucose, capillary     Status: Abnormal   Collection Time: 05/07/19  8:16 AM  Result Value Ref Range   Glucose-Capillary 119 (H) 70 - 99 mg/dL  POCT Activated clotting time     Status: None   Collection Time: 05/07/19  8:21 AM  Result Value Ref Range   Activated Clotting Time 153 seconds  POCT Activated clotting time     Status: None   Collection Time: 05/07/19  9:18 AM  Result Value Ref Range   Activated Clotting Time 158 seconds  POCT Activated clotting time     Status: None   Collection Time: 05/07/19 10:29 AM  Result Value Ref Range   Activated Clotting Time 158 seconds  POCT Activated clotting time     Status: None   Collection Time: 05/07/19 11:20 AM  Result Value Ref Range   Activated Clotting Time 158 seconds  Glucose, capillary     Status: Abnormal   Collection Time: 05/07/19 11:23 AM  Result Value Ref Range   Glucose-Capillary 104 (H) 70 - 99 mg/dL  POCT Activated clotting time     Status: None   Collection Time: 05/07/19 12:11 PM  Result Value Ref Range   Activated Clotting Time 164 seconds  POCT Activated clotting time     Status: None   Collection Time: 05/07/19  1:22 PM  Result Value Ref Range   Activated Clotting Time 169 seconds  POCT Activated clotting time     Status: None   Collection Time: 05/07/19  2:34 PM  Result Value Ref Range   Activated Clotting Time 175 seconds  POCT Activated clotting time     Status: None   Collection Time: 05/07/19  3:40 PM  Result Value Ref Range   Activated Clotting Time 169 seconds  Glucose, capillary     Status: Abnormal   Collection Time: 05/07/19  3:41 PM  Result Value Ref Range   Glucose-Capillary 142 (H) 70 - 99 mg/dL  POCT Activated clotting time     Status: None   Collection Time: 05/07/19  4:33 PM  Result Value Ref Range   Activated Clotting Time 180 seconds  POCT  Activated clotting time     Status: None   Collection Time: 05/07/19  5:22 PM  Result Value Ref Range   Activated Clotting Time 180 seconds  Renal function panel (daily at 1600)     Status: Abnormal   Collection Time: 05/07/19  5:35 PM  Result Value Ref Range   Sodium 134 (L) 135 - 145 mmol/L   Potassium 5.4 (H) 3.5 - 5.1 mmol/L   Chloride 100 98 - 111 mmol/L   CO2 22 22 - 32 mmol/L   Glucose, Bld 237 (H) 70 - 99 mg/dL   BUN 48 (H) 6 - 20 mg/dL   Creatinine, Ser 3.62 (H) 0.61 - 1.24 mg/dL   Calcium 9.7 8.9 - 10.3 mg/dL   Phosphorus 4.9 (H) 2.5 - 4.6 mg/dL   Albumin 2.2 (L) 3.5 - 5.0 g/dL   GFR calc non Af Amer 18 (L) >60 mL/min   GFR calc Af Amer 21 (L) >60 mL/min   Anion gap 12 5 - 15  Comment: Performed at [facility], [place] [address] [place], [place] [address]  POCT Activated clotting time     Status: None   Collection Time: 05/07/19  6:17 PM  Result Value Ref Range   Activated Clotting Time 180 seconds  POCT Activated clotting time     Status: None   Collection Time: 05/07/19  7:06 PM  Result Value Ref Range   Activated Clotting Time 186 seconds  Troponin I (High Sensitivity)     Status: Abnormal   Collection Time: 05/07/19  7:14 PM  Result Value Ref Range   Troponin I (High Sensitivity) 209 (HH) <18 ng/L    Comment: CRITICAL VALUE NOTED.  VALUE IS CONSISTENT WITH PREVIOUSLY REPORTED AND CALLED VALUE. (NOTE) Elevated high sensitivity troponin I (hsTnI) values and significant  changes across serial measurements may suggest ACS but many other  chronic and acute conditions are known to elevate hsTnI results.  Refer to the Links section for chest pain algorithms and additional  guidance. Performed at [facility], [place] [address] [place], [place] [address]   Glucose, capillary     Status: Abnormal   Collection Time: 05/07/19  7:46 PM  Result Value Ref Range   Glucose-Capillary 126 (H) 70 - 99 mg/dL  POCT Activated clotting time     Status: None    Collection Time: 05/07/19  8:01 PM  Result Value Ref Range   Activated Clotting Time 180 seconds  POCT Activated clotting time     Status: None   Collection Time: 05/07/19  9:03 PM  Result Value Ref Range   Activated Clotting Time 186 seconds  Troponin I (High Sensitivity)     Status: Abnormal   Collection Time: 05/07/19  9:09 PM  Result Value Ref Range   Troponin I (High Sensitivity) 204 (HH) <18 ng/L    Comment: CRITICAL VALUE NOTED.  VALUE IS CONSISTENT WITH PREVIOUSLY REPORTED AND CALLED VALUE. Performed at [facility], [place] [address] [place], [place] [address]   POCT Activated clotting time     Status: None   Collection Time: 05/07/19  9:59 PM  Result Value Ref Range   Activated Clotting Time 186 seconds  POCT Activated clotting time     Status: None   Collection Time: 05/07/19 11:02 PM  Result Value Ref Range   Activated Clotting Time 186 seconds  Glucose, capillary     Status: Abnormal   Collection Time: 05/07/19 11:03 PM  Result Value Ref Range   Glucose-Capillary 214 (H) 70 - 99 mg/dL  POCT Activated clotting time     Status: None   Collection Time: 05/07/19 11:59 PM  Result Value Ref Range   Activated Clotting Time 186 seconds  POCT Activated clotting time     Status: None   Collection Time: 05/08/19  1:02 AM  Result Value Ref Range   Activated Clotting Time 186 seconds  POCT Activated clotting time     Status: None   Collection Time: 05/08/19  2:02 AM  Result Value Ref Range   Activated Clotting Time 197 seconds  POCT Activated clotting time     Status: None   Collection Time: 05/08/19  2:57 AM  Result Value Ref Range   Activated Clotting Time 197 seconds  Glucose, capillary     Status: Abnormal   Collection Time: 05/08/19  3:05 AM  Result Value Ref Range   Glucose-Capillary 220 (H) 70 - 99 mg/dL  POCT Activated clotting time  Status: None   Collection Time: 05/08/19  3:58 AM  Result Value Ref Range   Activated Clotting Time 197 seconds  CBC      Status: Abnormal   Collection Time: 05/08/19  4:05 AM  Result Value Ref Range   WBC 9.5 4.0 - 10.5 K/uL   RBC 2.97 (L) 4.22 - 5.81 MIL/uL   Hemoglobin 8.3 (L) 13.0 - 17.0 g/dL   HCT 26.2 (L) 39.0 - 52.0 %   MCV 88.2 80.0 - 100.0 fL   MCH 27.9 26.0 - 34.0 pg   MCHC 31.7 30.0 - 36.0 g/dL   RDW 17.5 (H) 11.5 - 15.5 %   Platelets 372 150 - 400 K/uL   nRBC 0.4 (H) 0.0 - 0.2 %    Comment: Performed at [facility], [place] [address] [place], [place] [address]  Magnesium     Status: Abnormal   Collection Time: 05/08/19  4:05 AM  Result Value Ref Range   Magnesium 2.8 (H) 1.7 - 2.4 mg/dL    Comment: Performed at [facility] [address] [place], [place] [address]  APTT     Status: Abnormal   Collection Time: 05/08/19  4:05 AM  Result Value Ref Range   aPTT 156 (H) 24 - 36 seconds    Comment:        IF BASELINE aPTT IS ELEVATED, SUGGEST PATIENT RISK ASSESSMENT BE USED TO DETERMINE APPROPRIATE ANTICOAGULANT THERAPY. Performed at [facility], [place] [address] [place], [place] [address]   Renal function panel     Status: Abnormal   Collection Time: 05/08/19  4:05 AM  Result Value Ref Range   Sodium 134 (L) 135 - 145 mmol/L   Potassium 4.8 3.5 - 5.1 mmol/L   Chloride 100 98 - 111 mmol/L   CO2 23 22 - 32 mmol/L   Glucose, Bld 220 (H) 70 - 99 mg/dL   BUN 45 (H) 6 - 20 mg/dL   Creatinine, Ser 3.19 (H) 0.61 - 1.24 mg/dL   Calcium 10.2 8.9 - 10.3 mg/dL   Phosphorus 4.7 (H) 2.5 - 4.6 mg/dL   Albumin 2.2 (L) 3.5 - 5.0 g/dL   GFR calc non Af Amer 21 (L) >60 mL/min   GFR calc Af Amer 24 (L) >60 mL/min   Anion gap 11 5 - 15    Comment: Performed at [facility], [address], [place], [place] [address]  POCT Activated clotting time     Status: None   Collection Time: 05/08/19  4:54 AM  Result Value Ref Range   Activated Clotting Time 191 seconds  Glucose, capillary     Status: Abnormal   Collection Time: 05/08/19  7:53 AM  Result Value Ref Range    Glucose-Capillary 210 (H) 70 - 99 mg/dL  POCT Activated clotting time     Status: None   Collection Time: 05/08/19 11:15 AM  Result Value Ref Range   Activated Clotting Time 202 seconds  Glucose, capillary     Status: Abnormal   Collection Time: 05/08/19 11:28 AM  Result Value Ref Range   Glucose-Capillary 179 (H) 70 - 99 mg/dL    Recent Results (from the past 240 hour(s))  SARS CORONAVIRUS 2 (TAT 6-24 HRS) Nasopharyngeal Nasopharyngeal Swab     Status: Abnormal   Collection Time: 05/05/19  9:45 AM   Specimen: Nasopharyngeal Swab  Result Value Ref Range Status   SARS Coronavirus 2 POSITIVE (A) NEGATIVE Final  Comment: RESULT CALLED TO, READ BACK BY AND VERIFIED WITH: Geroge Baseman RN 16:45 05/05/19 ([email or web address]) (NOTE) SARS-CoV-2 target nucleic acids are DETECTED. The SARS-CoV-2 RNA is generally detectable in upper and lower respiratory specimens during the acute phase of infection. Positive results are indicative of active infection with SARS-CoV-2. Clinical  correlation with patient history and other diagnostic information is necessary to determine patient infection status. Positive results do  not rule out bacterial infection or co-infection with other viruses. The expected result is Negative. Fact Sheet for Patients: SugarRoll.be Fact Sheet for Healthcare Providers: https://www.woods-mathews.com/ This test is not yet approved or cleared by the [place] FDA and  has been authorized for detection and/or diagnosis of SARS-CoV-2 by FDA under an Emergency Use Authorization (EUA). This EUA will remain  in effect (meaning this test can be used)  for the duration of the COVID-19 declaration under Section 564(b)(1) of the Act, 21 U.S.C. section 360bbb-3(b)(1), unless the authorization is terminated or revoked sooner. Performed at [facility], [place] [address] [place], [place] [address]   Culture, respiratory (non-expectorated)      Status: None   Collection Time: 05/05/19 12:35 PM   Specimen: Tracheal Aspirate; Respiratory  Result Value Ref Range Status   Specimen Description TRACHEAL ASPIRATE  Final   Special Requests NONE  Final   Gram Stain   Final    RARE WBC PRESENT, PREDOMINANTLY PMN ABUNDANT GRAM POSITIVE RODS RARE GRAM NEGATIVE RODS Performed at [facility], [place] [address], [place], [place] [address]    Culture FEW ENTEROBACTER CLOACAE  Final   Report Status 05/07/2019 FINAL  Final   Organism ID, Bacteria ENTEROBACTER CLOACAE  Final      Susceptibility   Enterobacter cloacae - MIC*    CEFAZOLIN >=64 RESISTANT Resistant     CEFEPIME <=1 SENSITIVE Sensitive     CEFTAZIDIME <=1 SENSITIVE Sensitive     CEFTRIAXONE <=1 SENSITIVE Sensitive     CIPROFLOXACIN <=0.25 SENSITIVE Sensitive     GENTAMICIN <=1 SENSITIVE Sensitive     IMIPENEM <=0.25 SENSITIVE Sensitive     TRIMETH/SULFA <=20 SENSITIVE Sensitive     PIP/TAZO <=4 SENSITIVE Sensitive     * FEW ENTEROBACTER CLOACAE    Lipid Panel Recent Labs    05/07/19 0249  TRIG 241*    Studies/Results: Dg Chest Port 1 View  Result Date: 05/07/2019 CLINICAL DATA:  Ventilator dependent EXAM: PORTABLE CHEST 1 VIEW COMPARISON:  Two days ago FINDINGS: Endotracheal tube tip at the clavicular heads. The orogastric tube at least reaches the stomach. Right IJ line with tip at the SVC. Significant improvement in aeration, especially on the left. There is still a extensive amount of airspace disease densest at the right base. Suspect layering pleural fluid on the right. Cardiomegaly accentuated by rotation IMPRESSION: 1. Unremarkable hardware positioning. 2. Significant improvement in aeration, rapid change favoring improvement of edema. 3. Probable layering pleural effusion on the right. Electronically Signed   By: Monte Fantasia M.D.   On: 05/07/2019 08:02    Medications:  Scheduled: . sodium chloride   Intravenous Once  . sodium chloride   Intravenous  Once  . alteplase  2 mg Intracatheter Once  . aspirin  324 mg Per Tube Daily  . atorvastatin  80 mg Per Tube Daily  . chlorhexidine gluconate (MEDLINE KIT)  15 mL Mouth Rinse BID  . Chlorhexidine Gluconate Cloth  6 each Topical Daily  . darbepoetin (ARANESP) injection - DIALYSIS  200 mcg Intravenous Q  Tue-HD  . docusate  100 mg Per Tube BID  . famotidine  20 mg Per Tube Q0600  . feeding supplement (PRO-STAT SUGAR FREE 64)  30 mL Per Tube QID  . heparin injection (subcutaneous)  5,000 Units Subcutaneous Q8H  . insulin aspart  2-6 Units Subcutaneous Q4H  . mouth rinse  15 mL Mouth Rinse 10 times per day  . sevelamer carbonate  0.8 g Oral TID   Continuous: .  prismasol BGK 4/2.5 500 mL/hr at 05/08/19 0658  .  prismasol BGK 4/2.5 300 mL/hr at 05/08/19 0833  . albumin human    . cefTRIAXone (ROCEPHIN)  IV Stopped (05/08/19 1151)  . feeding supplement (VITAL 1.5 CAL) 1,000 mL (05/08/19 0358)  . heparin 10,000 units/ 20 mL infusion syringe 500 Units/hr (05/08/19 1011)  . heparin    . HYDROmorphone 3 mg/hr (05/08/19 1300)  . levETIRAcetam Stopped (05/08/19 1255)  . midazolam 10 mg/hr (05/08/19 1300)  . norepinephrine (LEVOPHED) Adult infusion Stopped (05/07/19 0722)  . prismasol BGK 2/2.5 dialysis solution 2,000 mL/hr at 05/08/19 1138   MRI brain 11/15: 1. Heterogeneous signal in the bilateral medial thalami is suspicious for anoxic injury in this clinical setting. Artifact or remote thalamic insults are felt less likely. 2. No other acute intracranial abnormality identified. Chronic right PICA cerebellar infarct. 3. Intubated with left nasoenteric tube in place. Diffuse paranasal sinus and mastoid inflammation.  EEG 11/18: This study issuggestive of profound diffuse encephalopathy, non specifc to etiology, could be secondary to sedation.No seizures or epileptiform discharges were seen throughout the recording. EEG appears worse than EEG on 04/29/2019 as this there is more low  voltage slowing compared to previous EEG which could at times be secondary to sedation.  Assessment: 53 year old male with ESRD and Covid-19, s/p cardiac arrest at dialysis x 12 minutes 1. MRI brain reveals FLAIR and DWI signal abnormalities within the medial thalami bilaterally, suspicious for anoxic brain injury. On personal review of the images, there is also patchy cortical DWI signal abnormality, suggestive of cortical injury as well.  2. Exam on sedation not sufficiently informative regarding cortical function. Today's exam is, however, improved relative to most recent exam by Neurohospitalist team. Overall appears most likely to be a combination of decreased cortical activity from diffuse anoxic brain injury combined with sedation.  3. EEG 11/18 without epileptiform discharges. Findings most consistent with profound diffuse encephalopathy. Effects of sedation most likely contributing to the EEG.   Recommendations: 1. Weaning off sedation has been difficult due to onset of agitation. Interim goal should therefore be minimal rate of Versed to achieve sedation. To determine the minimal rate of Versed gtt, would hold Versed until agitation becomes evident, then bolus Versed by 1 mg increments until sedation is achieved and use the total amount administered as the per hour drip rate (e.g. if total of 5 mg Versed was required to achieve sedation with boluses, then would restart at a rate of 5 mg/hr).  2. Would switch Dilaudid gtt to fentanyl gtt, which wears off more rapidly, allowing for more informative neurological exams after holding the IV opiate.  3. On Sunday, Neurology can repeat exam after sedation turned off for 1 hour, which should allow for better prognostication.  4. Discussed with Dr. Nelda Marseille.    35 minutes spent in the neurological evaluation and management of this critically ill patient. Time spent included review of MRI and coordination of care.   LOS: 11 days   _0   signed: Dr. Kerney Elbe  05/08/2019  1:51 PM

## 2019-05-09 ENCOUNTER — Inpatient Hospital Stay (HOSPITAL_COMMUNITY): Payer: Medicare Other

## 2019-05-09 DIAGNOSIS — U071 COVID-19: Secondary | ICD-10-CM | POA: Diagnosis not present

## 2019-05-09 DIAGNOSIS — I469 Cardiac arrest, cause unspecified: Secondary | ICD-10-CM | POA: Diagnosis not present

## 2019-05-09 DIAGNOSIS — R403 Persistent vegetative state: Secondary | ICD-10-CM

## 2019-05-09 DIAGNOSIS — J80 Acute respiratory distress syndrome: Secondary | ICD-10-CM | POA: Diagnosis not present

## 2019-05-09 DIAGNOSIS — J9601 Acute respiratory failure with hypoxia: Secondary | ICD-10-CM | POA: Diagnosis not present

## 2019-05-09 LAB — POCT ACTIVATED CLOTTING TIME
Activated Clotting Time: 213 seconds
Activated Clotting Time: 219 seconds
Activated Clotting Time: 219 seconds
Activated Clotting Time: 219 seconds
Activated Clotting Time: 224 seconds
Activated Clotting Time: 230 seconds
Activated Clotting Time: 191 seconds
Activated Clotting Time: 202 seconds
Activated Clotting Time: 213 seconds
Activated Clotting Time: 213 seconds
Activated Clotting Time: 213 seconds
Activated Clotting Time: 219 seconds
Activated Clotting Time: 224 seconds
Activated Clotting Time: 224 seconds
Activated Clotting Time: 224 seconds
Activated Clotting Time: 224 seconds
Activated Clotting Time: 224 seconds
Activated Clotting Time: 230 seconds
Activated Clotting Time: 175 seconds
Activated Clotting Time: 180 seconds
Activated Clotting Time: 186 seconds
Activated Clotting Time: 191 seconds

## 2019-05-09 LAB — CBC
HCT: 28.7 % — ABNORMAL LOW (ref 39.0–52.0)
Hemoglobin: 9.1 g/dL — ABNORMAL LOW (ref 13.0–17.0)
MCH: 28.2 pg (ref 26.0–34.0)
MCHC: 31.7 g/dL (ref 30.0–36.0)
MCV: 88.9 fL (ref 80.0–100.0)
Platelets: 492 10*3/uL — ABNORMAL HIGH (ref 150–400)
RBC: 3.23 MIL/uL — ABNORMAL LOW (ref 4.22–5.81)
RDW: 17.9 % — ABNORMAL HIGH (ref 11.5–15.5)
WBC: 12.6 10*3/uL — ABNORMAL HIGH (ref 4.0–10.5)
nRBC: 0.4 % — ABNORMAL HIGH (ref 0.0–0.2)

## 2019-05-09 LAB — RENAL FUNCTION PANEL
Albumin: 2.5 g/dL — ABNORMAL LOW (ref 3.5–5.0)
Albumin: 2.8 g/dL — ABNORMAL LOW (ref 3.5–5.0)
Anion gap: 10 (ref 5–15)
Anion gap: 14 (ref 5–15)
BUN: 39 mg/dL — ABNORMAL HIGH (ref 6–20)
BUN: 30 mg/dL — ABNORMAL HIGH (ref 6–20)
CO2: 24 mmol/L (ref 22–32)
CO2: 24 mmol/L (ref 22–32)
Calcium: 10.8 mg/dL — ABNORMAL HIGH (ref 8.9–10.3)
Calcium: 11 mg/dL — ABNORMAL HIGH (ref 8.9–10.3)
Chloride: 99 mmol/L (ref 98–111)
Chloride: 98 mmol/L (ref 98–111)
Creatinine, Ser: 2.82 mg/dL — ABNORMAL HIGH (ref 0.61–1.24)
Creatinine, Ser: 2.2 mg/dL — ABNORMAL HIGH (ref 0.61–1.24)
GFR calc Af Amer: 28 mL/min — ABNORMAL LOW (ref >60)
GFR calc Af Amer: 38 mL/min — ABNORMAL LOW (ref >60)
GFR calc non Af Amer: 24 mL/min — ABNORMAL LOW (ref >60)
GFR calc non Af Amer: 33 mL/min — ABNORMAL LOW (ref >60)
Glucose, Bld: 170 mg/dL — ABNORMAL HIGH (ref 70–99)
Glucose, Bld: 190 mg/dL — ABNORMAL HIGH (ref 70–99)
Phosphorus: 5.4 mg/dL — ABNORMAL HIGH (ref 2.5–4.6)
Phosphorus: 3.6 mg/dL (ref 2.5–4.6)
Potassium: 4.6 mmol/L (ref 3.5–5.1)
Potassium: 4 mmol/L (ref 3.5–5.1)
Sodium: 133 mmol/L — ABNORMAL LOW (ref 135–145)
Sodium: 136 mmol/L (ref 135–145)

## 2019-05-09 LAB — GLUCOSE, CAPILLARY
Glucose-Capillary: 143 mg/dL — ABNORMAL HIGH (ref 70–99)
Glucose-Capillary: 157 mg/dL — ABNORMAL HIGH (ref 70–99)
Glucose-Capillary: 161 mg/dL — ABNORMAL HIGH (ref 70–99)
Glucose-Capillary: 173 mg/dL — ABNORMAL HIGH (ref 70–99)
Glucose-Capillary: 238 mg/dL — ABNORMAL HIGH (ref 70–99)
Glucose-Capillary: 161 mg/dL — ABNORMAL HIGH (ref 70–99)

## 2019-05-09 LAB — POCT I-STAT 7, (LYTES, BLD GAS, ICA,H+H)
Acid-Base Excess: 1 mmol/L (ref 0.0–2.0)
Bicarbonate: 27 mmol/L (ref 20.0–28.0)
Calcium, Ion: 1.46 mmol/L — ABNORMAL HIGH (ref 1.15–1.40)
HCT: 39 % (ref 39.0–52.0)
Hemoglobin: 13.3 g/dL (ref 13.0–17.0)
O2 Saturation: 98 %
Patient temperature: 98.5
Potassium: 4.6 mmol/L (ref 3.5–5.1)
Sodium: 134 mmol/L — ABNORMAL LOW (ref 135–145)
TCO2: 28 mmol/L (ref 22–32)
pCO2 arterial: 46.8 mmHg (ref 32.0–48.0)
pH, Arterial: 7.369 (ref 7.350–7.450)
pO2, Arterial: 103 mmHg (ref 83.0–108.0)

## 2019-05-09 LAB — ANTINUCLEAR ANTIBODIES, IFA: ANA Ab, IFA: NEGATIVE

## 2019-05-09 LAB — MAGNESIUM: Magnesium: 2.8 mg/dL — ABNORMAL HIGH (ref 1.7–2.4)

## 2019-05-09 LAB — APTT: aPTT: >200 seconds (ref 24–36)

## 2019-05-09 MED ORDER — FENTANYL 2500MCG IN NS 250ML (10MCG/ML) PREMIX INFUSION
50.0000 ug/h | INTRAVENOUS | Status: DC
  Administered 2019-05-09: 200 ug/h via INTRAVENOUS
  Administered 2019-05-09: 150 ug/h via INTRAVENOUS
  Administered 2019-05-10: 100 ug/h via INTRAVENOUS
  Filled 2019-05-09 (×2): qty 250

## 2019-05-09 MED ORDER — MIDAZOLAM HCL 2 MG/2ML IJ SOLN
2.0000 mg | INTRAMUSCULAR | Status: DC | PRN
  Administered 2019-05-09: 2 mg via INTRAVENOUS
  Filled 2019-05-09: qty 2

## 2019-05-09 MED ORDER — METOPROLOL TARTRATE 5 MG/5ML IV SOLN
2.5000 mg | INTRAVENOUS | Status: DC | PRN
  Administered 2019-05-09 – 2019-05-12 (×4): 5 mg via INTRAVENOUS
  Filled 2019-05-09 (×4): qty 5

## 2019-05-09 MED ORDER — DEXMEDETOMIDINE HCL IN NACL 400 MCG/100ML IV SOLN
0.4000 ug/kg/h | INTRAVENOUS | Status: DC
  Administered 2019-05-09: 1 ug/kg/h via INTRAVENOUS
  Administered 2019-05-09: 0.4 ug/kg/h via INTRAVENOUS
  Administered 2019-05-09: 0.6 ug/kg/h via INTRAVENOUS
  Administered 2019-05-10: 0.4 ug/kg/h via INTRAVENOUS
  Filled 2019-05-09 (×3): qty 100

## 2019-05-09 MED ORDER — FENTANYL BOLUS VIA INFUSION
50.0000 ug | INTRAVENOUS | Status: DC | PRN
  Administered 2019-05-09 – 2019-05-10 (×2): 50 ug via INTRAVENOUS
  Filled 2019-05-09: qty 50

## 2019-05-09 MED ORDER — FENTANYL CITRATE (PF) 100 MCG/2ML IJ SOLN: 50.0000 ug | Freq: Once | INTRAMUSCULAR | Status: DC

## 2019-05-09 NOTE — Progress Notes (Signed)
Subjective:  CRRT running well-  Removed 2300 over last 24 hours-  BP still high and CVP 9-13  Objective Vital signs in last 24 hours: Vitals:   05/09/19 0358 05/09/19 0400 05/09/19 0500 05/09/19 0600  BP:  (!) 156/74 (!) 161/76 (!) 170/75  Pulse:  93 (!) 102 99  Resp:  (!) 30 (!) 30 (!) 30  Temp:  99 F (37.2 C)    TempSrc:  Oral    SpO2:  100% 100% 100%  Weight: 110.5 kg     Height:       Weight change: -1.3 kg  Intake/Output Summary (Last 24 hours) at 05/09/2019 0656 Last data filed at 05/09/2019 0600 Gross per 24 hour  Intake 2451.93 ml  Output 4933 ml  Net -2481.07 ml    Outpt HD:TTS -  [facility] 4.5h F250 124.5kg 400/800 2/2.25 Hep 12,000 AVF - mircera 30 q4 last 10/13 - hect 4   Assessment/ Plan: Pt is a 53 y.o. yo male with ESRD who was admitted on 04/27/2019 with resp failure due to COVID/pulm edema and cardiac arrest  Assessment/Plan: 1. Resp failure - remains on vent, had some worsening -- previously paralyzed, now just on sedation - per CCM.  abx include rocephin  2. ESRD - normally TTS as OP via TDC. Had IHD Mon and Tuesday.  Decision made to put on CRRT on Wed 11/18 in response to him being catabolic with vent difficulties.  Labs improving- still is tenuous and catabolic so will continue for now.  K is finally better 3. Anemia- hgb dropping- s/p transfusion 11/16 and 11/19-  Increased his ESA- cont supportive care-  Suspect hg today is error (13) 4. Secondary hyperparathyroidism- normally on hect 4-- has not been given here-  Calcium corrected is high- on renvela for his phos but getting TF so difficult -  Phos down on CRRT  5. HTN/volume- appearing to be volume overloaded -  CRRT for more effective volume removal - still appropriate for pulling some again today - set for 75-100 per hour  6. Question of anoxic brain injury-  Complicating factor - neuro involved   Louis Meckel    Labs: Basic Metabolic Panel: Recent Labs  Lab  05/08/19 0405 05/08/19 1633 05/09/19 0255 05/09/19 0312  NA 134* 135 133* 134*  K 4.8 5.0 4.6 4.6  CL 100 99 99  --   CO2 '23 27 24  ' --   GLUCOSE 220* 189* 170*  --   BUN 45* 42* 39*  --   CREATININE 3.19* 2.93* 2.82*  --   CALCIUM 10.2 10.6* 10.8*  --   PHOS 4.7* 4.6 5.4*  --    Liver Function Tests: Recent Labs  Lab 05/08/19 0405 05/08/19 1633 05/09/19 0255  ALBUMIN 2.2* 2.3* 2.5*   No results for input(s): LIPASE, AMYLASE in the last 168 hours. No results for input(s): AMMONIA in the last 168 hours. CBC: Recent Labs  Lab 05/03/19 0354  05/05/19 1600 05/06/19 0432  05/07/19 0249 05/08/19 0405 05/09/19 0255 05/09/19 0312  WBC 6.0   < > 8.7 6.6  --  7.6 9.5 12.6*  --   NEUTROABS 4.0  --   --   --   --   --   --   --   --   HGB 5.8*   < > 7.1* 6.8*   < > 8.7* 8.3* 9.1* 13.3  HCT 17.7*   < > 20.9* 20.8*   < > 26.3* 26.2* 28.7*  39.0  MCV 84.3   < > 85.3 86.7  --  85.7 88.2 88.9  --   PLT 110*   < > 198 223  --  268 372 492*  --    < > = values in this interval not displayed.   Cardiac Enzymes: No results for input(s): CKTOTAL, CKMB, CKMBINDEX, TROPONINI in the last 168 hours. CBG: Recent Labs  Lab 05/08/19 0305 05/08/19 0753 05/08/19 1128 05/08/19 1610 05/08/19 1932  GLUCAP 220* 210* 179* 173* 153*    Iron Studies: No results for input(s): IRON, TIBC, TRANSFERRIN, FERRITIN in the last 72 hours. Studies/Results: No results found. Medications: Infusions: .  prismasol BGK 4/2.5 500 mL/hr at 05/09/19 0355  .  prismasol BGK 4/2.5 300 mL/hr at 05/09/19 0151  . albumin human    . cefTRIAXone (ROCEPHIN)  IV Stopped (05/08/19 1151)  . feeding supplement (VITAL 1.5 CAL) 1,000 mL (05/08/19 2308)  . heparin 10,000 units/ 20 mL infusion syringe 1,450 Units/hr (05/09/19 0619)  . heparin    . HYDROmorphone 2 mg/hr (05/09/19 0600)  . levETIRAcetam Stopped (05/08/19 2340)  . midazolam 6 mg/hr (05/09/19 0600)  . norepinephrine (LEVOPHED) Adult infusion Stopped  (05/07/19 0722)  . prismasol BGK 2/2.5 dialysis solution 2,000 mL/hr at 05/09/19 2353    Scheduled Medications: . sodium chloride   Intravenous Once  . sodium chloride   Intravenous Once  . alteplase  2 mg Intracatheter Once  . aspirin  324 mg Per Tube Daily  . atorvastatin  80 mg Per Tube Daily  . chlorhexidine gluconate (MEDLINE KIT)  15 mL Mouth Rinse BID  . Chlorhexidine Gluconate Cloth  6 each Topical Daily  . darbepoetin (ARANESP) injection - DIALYSIS  200 mcg Intravenous Q Tue-HD  . docusate  100 mg Per Tube BID  . famotidine  20 mg Per Tube Q0600  . feeding supplement (PRO-STAT SUGAR FREE 64)  30 mL Per Tube QID  . heparin injection (subcutaneous)  5,000 Units Subcutaneous Q8H  . insulin aspart  2-6 Units Subcutaneous Q4H  . mouth rinse  15 mL Mouth Rinse 10 times per day  . sevelamer carbonate  0.8 g Oral TID    have reviewed scheduled and prn medications.  Physical Exam: Full PE not done-  I am able to visualize patient in room.  Kept in isolation due to COVID positive status to preserve PPE and to limit exposure to providers as able   05/09/2019,6:56 AM  LOS: 12 days

## 2019-05-09 NOTE — Progress Notes (Signed)
Subjective: Eyes open but unresponsive to commands. Has been off all sedation for 2 hours prior to exam.   Objective: Current vital signs: BP (!) 164/66   Pulse (!) 116   Temp 98.8 F (37.1 C) (Oral)   Resp (!) 30   Ht 5' 10" (1.778 m)   Wt 110.5 kg   SpO2 100%   BMI 34.95 kg/m  Vital signs in last 24 hours: Temp:  [98.4 F (36.9 C)-99 F (37.2 C)] 98.8 F (37.1 C) (11/22 0800) Pulse Rate:  [85-116] 116 (11/22 0900) Resp:  [27-32] 30 (11/22 0900) BP: (139-180)/(60-87) 164/66 (11/22 0924) SpO2:  [95 %-100 %] 100 % (11/22 0900) Arterial Line BP: (138-183)/(51-73) 183/73 (11/22 0900) FiO2 (%):  [40 %] 40 % (11/22 0815) Weight:  [110.5 kg] 110.5 kg (11/22 0358)  Intake/Output from previous day: 11/21 0701 - 11/22 0700 In: 2536.9 [I.V.:576.9; NG/GT:1560; IV Piggyback:300] Out: 5089 [Emesis/NG output:100; Stool:200] Intake/Output this shift: Total I/O In: 157 [I.V.:27; NG/GT:130] Out: 350 [Other:350] Nutritional status:  Diet Order            Diet NPO time specified  Diet effective now             HEENT: [place]/AT Lungs: Intubated Ext: Warm and well perfused  Neurologic Exam: Has been off all sedation for 2 hours prior to exam.  Ment: Awake with eyes open and normal blink rate, but unresponsive to commands. Will not gaze towards or away from visual stimuli. Will not follow commands or turn head towards person calling his name. No attempts to communicate. No purposeful movement of limbs.  CN: PERRL. No blink to threat. Does not gaze towards or away from visual stimuli. Eyes midline. No nystagmus. Face flaccidly symmetric. Head preferentially turned slightly towards the left, but also was seen to be at the midline initially.  Motor: Decreased tone all 4 extremities.  Will withdraw as well as semipurposefully move BUE slightly to pinch in each extremity; no asymmetry noted today to BUE movement. Strength continues to be rated as 2/5.  Will flex BLE slightly to pinch with  1-2/5 strength. Will move BUE semipurposefully as well in response to noxious plantar stimulation. Reflexes: Brisk low-amplitude biceps, brachioradialis and patellae.  Lab Results: Results for orders placed or performed during the hospital encounter of 04/27/19 (from the past 48 hour(s))  POCT Activated clotting time     Status: None   Collection Time: 05/07/19 10:29 AM  Result Value Ref Range   Activated Clotting Time 158 seconds  POCT Activated clotting time     Status: None   Collection Time: 05/07/19 11:20 AM  Result Value Ref Range   Activated Clotting Time 158 seconds  Glucose, capillary     Status: Abnormal   Collection Time: 05/07/19 11:23 AM  Result Value Ref Range   Glucose-Capillary 104 (H) 70 - 99 mg/dL  POCT Activated clotting time     Status: None   Collection Time: 05/07/19 12:11 PM  Result Value Ref Range   Activated Clotting Time 164 seconds  POCT Activated clotting time     Status: None   Collection Time: 05/07/19  1:22 PM  Result Value Ref Range   Activated Clotting Time 169 seconds  POCT Activated clotting time     Status: None   Collection Time: 05/07/19  2:34 PM  Result Value Ref Range   Activated Clotting Time 175 seconds  POCT Activated clotting time     Status: None   Collection Time: 05/07/19  3:40 PM  Result Value Ref Range   Activated Clotting Time 169 seconds  Glucose, capillary     Status: Abnormal   Collection Time: 05/07/19  3:41 PM  Result Value Ref Range   Glucose-Capillary 142 (H) 70 - 99 mg/dL  POCT Activated clotting time     Status: None   Collection Time: 05/07/19  4:33 PM  Result Value Ref Range   Activated Clotting Time 180 seconds  POCT Activated clotting time     Status: None   Collection Time: 05/07/19  5:22 PM  Result Value Ref Range   Activated Clotting Time 180 seconds  Renal function panel (daily at 1600)     Status: Abnormal   Collection Time: 05/07/19  5:35 PM  Result Value Ref Range   Sodium 134 (L) 135 - 145 mmol/L    Potassium 5.4 (H) 3.5 - 5.1 mmol/L   Chloride 100 98 - 111 mmol/L   CO2 22 22 - 32 mmol/L   Glucose, Bld 237 (H) 70 - 99 mg/dL   BUN 48 (H) 6 - 20 mg/dL   Creatinine, Ser 3.62 (H) 0.61 - 1.24 mg/dL   Calcium 9.7 8.9 - 10.3 mg/dL   Phosphorus 4.9 (H) 2.5 - 4.6 mg/dL   Albumin 2.2 (L) 3.5 - 5.0 g/dL   GFR calc non Af Amer 18 (L) >60 mL/min   GFR calc Af Amer 21 (L) >60 mL/min   Anion gap 12 5 - 15    Comment: Performed at [facility], [address] [place], [place] [address]  POCT Activated clotting time     Status: None   Collection Time: 05/07/19  6:17 PM  Result Value Ref Range   Activated Clotting Time 180 seconds  POCT Activated clotting time     Status: None   Collection Time: 05/07/19  7:06 PM  Result Value Ref Range   Activated Clotting Time 186 seconds  Troponin I (High Sensitivity)     Status: Abnormal   Collection Time: 05/07/19  7:14 PM  Result Value Ref Range   Troponin I (High Sensitivity) 209 (HH) <18 ng/L    Comment: CRITICAL VALUE NOTED.  VALUE IS CONSISTENT WITH PREVIOUSLY REPORTED AND CALLED VALUE. (NOTE) Elevated high sensitivity troponin I (hsTnI) values and significant  changes across serial measurements may suggest ACS but many other  chronic and acute conditions are known to elevate hsTnI results.  Refer to the Links section for chest pain algorithms and additional  guidance. Performed at [facility], [address] [place], [place] [address]   Glucose, capillary     Status: Abnormal   Collection Time: 05/07/19  7:46 PM  Result Value Ref Range   Glucose-Capillary 126 (H) 70 - 99 mg/dL  POCT Activated clotting time     Status: None   Collection Time: 05/07/19  8:01 PM  Result Value Ref Range   Activated Clotting Time 180 seconds  POCT Activated clotting time     Status: None   Collection Time: 05/07/19  9:03 PM  Result Value Ref Range   Activated Clotting Time 186 seconds  Troponin I (High Sensitivity)     Status: Abnormal    Collection Time: 05/07/19  9:09 PM  Result Value Ref Range   Troponin I (High Sensitivity) 204 (HH) <18 ng/L    Comment: CRITICAL VALUE NOTED.  VALUE IS CONSISTENT WITH PREVIOUSLY REPORTED AND CALLED VALUE. Performed at [facility], [address] [place], [place] [address]     POCT Activated clotting time     Status: None   Collection Time: 05/07/19  9:59 PM  Result Value Ref Range   Activated Clotting Time 186 seconds  POCT Activated clotting time     Status: None   Collection Time: 05/07/19 11:02 PM  Result Value Ref Range   Activated Clotting Time 186 seconds  Glucose, capillary     Status: Abnormal   Collection Time: 05/07/19 11:03 PM  Result Value Ref Range   Glucose-Capillary 214 (H) 70 - 99 mg/dL  POCT Activated clotting time     Status: None   Collection Time: 05/07/19 11:59 PM  Result Value Ref Range   Activated Clotting Time 186 seconds  POCT Activated clotting time     Status: None   Collection Time: 05/08/19  1:02 AM  Result Value Ref Range   Activated Clotting Time 186 seconds  POCT Activated clotting time     Status: None   Collection Time: 05/08/19  2:02 AM  Result Value Ref Range   Activated Clotting Time 197 seconds  POCT Activated clotting time     Status: None   Collection Time: 05/08/19  2:57 AM  Result Value Ref Range   Activated Clotting Time 197 seconds  Glucose, capillary     Status: Abnormal   Collection Time: 05/08/19  3:05 AM  Result Value Ref Range   Glucose-Capillary 220 (H) 70 - 99 mg/dL  POCT Activated clotting time     Status: None   Collection Time: 05/08/19  3:58 AM  Result Value Ref Range   Activated Clotting Time 197 seconds  CBC     Status: Abnormal   Collection Time: 05/08/19  4:05 AM  Result Value Ref Range   WBC 9.5 4.0 - 10.5 K/uL   RBC 2.97 (L) 4.22 - 5.81 MIL/uL   Hemoglobin 8.3 (L) 13.0 - 17.0 g/dL   HCT 26.2 (L) 39.0 - 52.0 %   MCV 88.2 80.0 - 100.0 fL   MCH 27.9 26.0 - 34.0 pg   MCHC 31.7 30.0 - 36.0 g/dL   RDW  17.5 (H) 11.5 - 15.5 %   Platelets 372 150 - 400 K/uL   nRBC 0.4 (H) 0.0 - 0.2 %    Comment: Performed at [facility], [place] [address] [place], [place] [address]  Magnesium     Status: Abnormal   Collection Time: 05/08/19  4:05 AM  Result Value Ref Range   Magnesium 2.8 (H) 1.7 - 2.4 mg/dL    Comment: Performed at [facility] [address] [place], [place] [address]  APTT     Status: Abnormal   Collection Time: 05/08/19  4:05 AM  Result Value Ref Range   aPTT 156 (H) 24 - 36 seconds    Comment:        IF BASELINE aPTT IS ELEVATED, SUGGEST PATIENT RISK ASSESSMENT BE USED TO DETERMINE APPROPRIATE ANTICOAGULANT THERAPY. Performed at [facility], [place] [address] [place], [place] [address]   Renal function panel     Status: Abnormal   Collection Time: 05/08/19  4:05 AM  Result Value Ref Range   Sodium 134 (L) 135 - 145 mmol/L   Potassium 4.8 3.5 - 5.1 mmol/L   Chloride 100 98 - 111 mmol/L   CO2 23 22 - 32 mmol/L   Glucose, Bld 220 (H) 70 - 99 mg/dL   BUN 45 (H) 6 - 20 mg/dL   Creatinine, Ser 3.19 (H) 0.61 -  1.24 mg/dL   Calcium 10.2 8.9 - 10.3 mg/dL   Phosphorus 4.7 (H) 2.5 - 4.6 mg/dL   Albumin 2.2 (L) 3.5 - 5.0 g/dL   GFR calc non Af Amer 21 (L) >60 mL/min   GFR calc Af Amer 24 (L) >60 mL/min   Anion gap 11 5 - 15    Comment: Performed at [facility] [address] [place], [place] [address]  POCT Activated clotting time     Status: None   Collection Time: 05/08/19  4:54 AM  Result Value Ref Range   Activated Clotting Time 191 seconds  Glucose, capillary     Status: Abnormal   Collection Time: 05/08/19  7:53 AM  Result Value Ref Range   Glucose-Capillary 210 (H) 70 - 99 mg/dL  POCT Activated clotting time     Status: None   Collection Time: 05/08/19 11:15 AM  Result Value Ref Range   Activated Clotting Time 202 seconds  Glucose, capillary     Status: Abnormal   Collection Time: 05/08/19 11:28 AM  Result Value Ref Range    Glucose-Capillary 179 (H) 70 - 99 mg/dL  Glucose, capillary     Status: Abnormal   Collection Time: 05/08/19  4:10 PM  Result Value Ref Range   Glucose-Capillary 173 (H) 70 - 99 mg/dL  Renal function panel (daily at 1600)     Status: Abnormal   Collection Time: 05/08/19  4:33 PM  Result Value Ref Range   Sodium 135 135 - 145 mmol/L   Potassium 5.0 3.5 - 5.1 mmol/L   Chloride 99 98 - 111 mmol/L   CO2 27 22 - 32 mmol/L   Glucose, Bld 189 (H) 70 - 99 mg/dL   BUN 42 (H) 6 - 20 mg/dL   Creatinine, Ser 2.93 (H) 0.61 - 1.24 mg/dL   Calcium 10.6 (H) 8.9 - 10.3 mg/dL   Phosphorus 4.6 2.5 - 4.6 mg/dL   Albumin 2.3 (L) 3.5 - 5.0 g/dL   GFR calc non Af Amer 23 (L) >60 mL/min   GFR calc Af Amer 27 (L) >60 mL/min   Anion gap 9 5 - 15    Comment: Performed at [facility], [place] [address], [place], [place] [address]  POCT Activated clotting time     Status: None   Collection Time: 05/08/19  5:25 PM  Result Value Ref Range   Activated Clotting Time 213 seconds  Glucose, capillary     Status: Abnormal   Collection Time: 05/08/19  7:32 PM  Result Value Ref Range   Glucose-Capillary 153 (H) 70 - 99 mg/dL  POCT Activated clotting time     Status: None   Collection Time: 05/08/19  7:32 PM  Result Value Ref Range   Activated Clotting Time 224 seconds  POCT Activated clotting time     Status: None   Collection Time: 05/08/19  9:01 PM  Result Value Ref Range   Activated Clotting Time 224 seconds  POCT Activated clotting time     Status: None   Collection Time: 05/08/19 10:05 PM  Result Value Ref Range   Activated Clotting Time 219 seconds  POCT Activated clotting time     Status: None   Collection Time: 05/08/19 11:03 PM  Result Value Ref Range   Activated Clotting Time 230 seconds  POCT Activated clotting time     Status: None   Collection Time: 05/09/19 12:07 AM  Result Value Ref Range   Activated Clotting Time 219  seconds  Glucose, capillary     Status: Abnormal   Collection Time:  05/09/19 12:07 AM  Result Value Ref Range   Glucose-Capillary 157 (H) 70 - 99 mg/dL  POCT Activated clotting time     Status: None   Collection Time: 05/09/19  1:07 AM  Result Value Ref Range   Activated Clotting Time 219 seconds  POCT Activated clotting time     Status: None   Collection Time: 05/09/19  1:57 AM  Result Value Ref Range   Activated Clotting Time 224 seconds  Renal function panel (daily at 0500)     Status: Abnormal   Collection Time: 05/09/19  2:55 AM  Result Value Ref Range   Sodium 133 (L) 135 - 145 mmol/L   Potassium 4.6 3.5 - 5.1 mmol/L   Chloride 99 98 - 111 mmol/L   CO2 24 22 - 32 mmol/L   Glucose, Bld 170 (H) 70 - 99 mg/dL   BUN 39 (H) 6 - 20 mg/dL   Creatinine, Ser 2.82 (H) 0.61 - 1.24 mg/dL   Calcium 10.8 (H) 8.9 - 10.3 mg/dL   Phosphorus 5.4 (H) 2.5 - 4.6 mg/dL   Albumin 2.5 (L) 3.5 - 5.0 g/dL   GFR calc non Af Amer 24 (L) >60 mL/min   GFR calc Af Amer 28 (L) >60 mL/min   Anion gap 10 5 - 15    Comment: Performed at [facility], [address] [place], [place] [address]  Magnesium     Status: Abnormal   Collection Time: 05/09/19  2:55 AM  Result Value Ref Range   Magnesium 2.8 (H) 1.7 - 2.4 mg/dL    Comment: Performed at [facility], [address] [place], [place] [address]  APTT     Status: Abnormal   Collection Time: 05/09/19  2:55 AM  Result Value Ref Range   aPTT >200 (HH) 24 - 36 seconds    Comment:        IF BASELINE aPTT IS ELEVATED, SUGGEST PATIENT RISK ASSESSMENT BE USED TO DETERMINE APPROPRIATE ANTICOAGULANT THERAPY. REPEATED TO VERIFY CRITICAL RESULT CALLED TO, READ BACK BY AND VERIFIED WITH: HERDERSON N, RN AT 0400 ON 05/09/2019 BY SAINVILUS S Performed at [facility], [address] [place], [place] [address]   CBC AM     Status: Abnormal   Collection Time: 05/09/19  2:55 AM  Result Value Ref Range   WBC 12.6 (H) 4.0 - 10.5 K/uL   RBC 3.23 (L) 4.22 - 5.81 MIL/uL   Hemoglobin 9.1 (L) 13.0 - 17.0 g/dL    HCT 28.7 (L) 39.0 - 52.0 %   MCV 88.9 80.0 - 100.0 fL   MCH 28.2 26.0 - 34.0 pg   MCHC 31.7 30.0 - 36.0 g/dL   RDW 17.9 (H) 11.5 - 15.5 %   Platelets 492 (H) 150 - 400 K/uL   nRBC 0.4 (H) 0.0 - 0.2 %    Comment: Performed at [facility], [address] [place], [place] [address]  POCT Activated clotting time     Status: None   Collection Time: 05/09/19  3:05 AM  Result Value Ref Range   Activated Clotting Time 213 seconds  Glucose, capillary     Status: Abnormal   Collection Time: 05/09/19  3:05 AM  Result Value Ref Range   Glucose-Capillary 161 (H) 70 - 99 mg/dL  I-STAT 7, (LYTES, BLD GAS, ICA, H+H)     Status: Abnormal     Collection Time: 05/09/19  3:12 AM  Result Value Ref Range   pH, Arterial 7.369 7.350 - 7.450   pCO2 arterial 46.8 32.0 - 48.0 mmHg   pO2, Arterial 103.0 83.0 - 108.0 mmHg   Bicarbonate 27.0 20.0 - 28.0 mmol/L   TCO2 28 22 - 32 mmol/L   O2 Saturation 98.0 %   Acid-Base Excess 1.0 0.0 - 2.0 mmol/L   Sodium 134 (L) 135 - 145 mmol/L   Potassium 4.6 3.5 - 5.1 mmol/L   Calcium, Ion 1.46 (H) 1.15 - 1.40 mmol/L   HCT 39.0 39.0 - 52.0 %   Hemoglobin 13.3 13.0 - 17.0 g/dL   Patient temperature 98.5 F    Collection site ARTERIAL LINE    Drawn by Operator    Sample type ARTERIAL   Glucose, capillary     Status: Abnormal   Collection Time: 05/09/19  7:10 AM  Result Value Ref Range   Glucose-Capillary 143 (H) 70 - 99 mg/dL    Recent Results (from the past 240 hour(s))  SARS CORONAVIRUS 2 (TAT 6-24 HRS) Nasopharyngeal Nasopharyngeal Swab     Status: Abnormal   Collection Time: 05/05/19  9:45 AM   Specimen: Nasopharyngeal Swab  Result Value Ref Range Status   SARS Coronavirus 2 POSITIVE (A) NEGATIVE Final    Comment: RESULT CALLED TO, READ BACK BY AND VERIFIED WITH: K. Ousterman RN 16:45 05/05/19 ([email or web address]) (NOTE) SARS-CoV-2 target nucleic acids are DETECTED. The SARS-CoV-2 RNA is generally detectable in upper and lower respiratory specimens during the  acute phase of infection. Positive results are indicative of active infection with SARS-CoV-2. Clinical  correlation with patient history and other diagnostic information is necessary to determine patient infection status. Positive results do  not rule out bacterial infection or co-infection with other viruses. The expected result is Negative. Fact Sheet for Patients: https://www.fda.gov/media/138098/download Fact Sheet for Healthcare Providers: https://www.fda.gov/media/138095/download This test is not yet approved or cleared by the [place] FDA and  has been authorized for detection and/or diagnosis of SARS-CoV-2 by FDA under an Emergency Use Authorization (EUA). This EUA will remain  in effect (meaning this test can be used)  for the duration of the COVID-19 declaration under Section 564(b)(1) of the Act, 21 U.S.C. section 360bbb-3(b)(1), unless the authorization is terminated or revoked sooner. Performed at [facility], [address] [place], [place] [address]   Culture, respiratory (non-expectorated)     Status: None   Collection Time: 05/05/19 12:35 PM   Specimen: Tracheal Aspirate; Respiratory  Result Value Ref Range Status   Specimen Description TRACHEAL ASPIRATE  Final   Special Requests NONE  Final   Gram Stain   Final    RARE WBC PRESENT, PREDOMINANTLY PMN ABUNDANT GRAM POSITIVE RODS RARE GRAM NEGATIVE RODS Performed at [facility], [address] [place], [place] [address]    Culture FEW ENTEROBACTER CLOACAE  Final   Report Status 05/07/2019 FINAL  Final   Organism ID, Bacteria ENTEROBACTER CLOACAE  Final      Susceptibility   Enterobacter cloacae - MIC*    CEFAZOLIN >=64 RESISTANT Resistant     CEFEPIME <=1 SENSITIVE Sensitive     CEFTAZIDIME <=1 SENSITIVE Sensitive     CEFTRIAXONE <=1 SENSITIVE Sensitive     CIPROFLOXACIN <=0.25 SENSITIVE Sensitive     GENTAMICIN <=1 SENSITIVE Sensitive     IMIPENEM <=0.25 SENSITIVE Sensitive      TRIMETH/SULFA <=20 SENSITIVE Sensitive     PIP/TAZO <=4 SENSITIVE Sensitive     *   FEW ENTEROBACTER CLOACAE    Lipid Panel Recent Labs    05/07/19 0249  TRIG 241*    Studies/Results: Dg Chest Port 1 View  Result Date: 05/09/2019 CLINICAL DATA:  Ventilator dependence. EXAM: PORTABLE CHEST 1 VIEW COMPARISON:  05/07/2019 FINDINGS: 0541 hours. Leftward patient rotation. Endotracheal tube tip is 4.9 cm above the base of the carina. The NG tube passes into the stomach although the distal tip position is not included on the film. Right IJ central line tip overlies the proximal SVC. Cardiopericardial silhouette is at upper limits of normal for size. Retrocardiac left base collapse/consolidation persists. The right basilar airspace disease and layering pleural effusion seen previously has resolved in the interval. Telemetry leads overlie the chest. IMPRESSION: Interval improvement in aeration at the right base with residual retrocardiac left base atelectasis/infiltrate. Electronically Signed   By: Eric  Mansell M.D.   On: 05/09/2019 07:58    Medications:  Scheduled: . sodium chloride   Intravenous Once  . sodium chloride   Intravenous Once  . alteplase  2 mg Intracatheter Once  . aspirin  324 mg Per Tube Daily  . atorvastatin  80 mg Per Tube Daily  . chlorhexidine gluconate (MEDLINE KIT)  15 mL Mouth Rinse BID  . Chlorhexidine Gluconate Cloth  6 each Topical Daily  . darbepoetin (ARANESP) injection - DIALYSIS  200 mcg Intravenous Q Tue-HD  . docusate  100 mg Per Tube BID  . famotidine  20 mg Per Tube Q0600  . feeding supplement (PRO-STAT SUGAR FREE 64)  30 mL Per Tube QID  . fentaNYL (SUBLIMAZE) injection  50 mcg Intravenous Once  . heparin injection (subcutaneous)  5,000 Units Subcutaneous Q8H  . insulin aspart  2-6 Units Subcutaneous Q4H  . mouth rinse  15 mL Mouth Rinse 10 times per day  . sevelamer carbonate  0.8 g Oral TID   Continuous: .  prismasol BGK 4/2.5 500 mL/hr at 05/09/19  0355  .  prismasol BGK 4/2.5 300 mL/hr at 05/09/19 0151  . albumin human    . cefTRIAXone (ROCEPHIN)  IV Stopped (05/08/19 1151)  . feeding supplement (VITAL 1.5 CAL) 1,000 mL (05/08/19 2308)  . fentaNYL infusion INTRAVENOUS    . heparin 10,000 units/ 20 mL infusion syringe 1,250 Units/hr (05/09/19 0940)  . heparin    . levETIRAcetam Stopped (05/08/19 2340)  . midazolam Stopped (05/09/19 0748)  . norepinephrine (LEVOPHED) Adult infusion Stopped (05/07/19 0722)  . prismasol BGK 2/2.5 dialysis solution 2,000 mL/hr at 05/09/19 0758    MRI brain 11/15: 1. Heterogeneous signal in the bilateral medial thalami is suspicious for anoxic injury in this clinical setting. Artifact or remote thalamic insults are felt less likely. 2. No other acute intracranial abnormality identified. Chronic right PICA cerebellar infarct. 3. Intubated with left nasoenteric tube in place. Diffuse paranasal sinus and mastoid inflammation.  EEG 11/18: This study issuggestive of profound diffuse encephalopathy, non specifc to etiology, could be secondary to sedation.No seizures or epileptiform discharges were seen throughout the recording. EEG appears worse than EEG on 04/29/2019 as this there is more low voltage slowing compared to previous EEGwhich could at times be secondary to sedation.  Assessment: 53 year old male with ESRD and Covid-19, s/p cardiac arrest at dialysis x 12 minutes 1. MRI brain reveals FLAIR and DWI signal abnormalities within the medial thalami bilaterally, suspicious for anoxic brain injury. On personal review of the images, there is also patchy cortical DWI signal abnormality, suggestive of cortical injury as well.  2. Examtoday   was off sedation x 2 hours and is therefore significantly more sensitive/specific for his neurological functioning than priors. Unfortunately, exam findings today (11/22) are most consistent with a minimally conscious state, essentially a slightly higher level of  functioning than a vegetative state. 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   3. EEG 11/18 without epileptiform discharges. Findings most consistent with profound diffuse encephalopathy. Effects of sedation most likely contributed to the EEG at that time.   Recommendations: 1.Now off benzodiazepine sedation and only on PRN fentanyl. Patient was not agitated during my exam today off all sedation x 2 hours and it appears likely that he may be able to be continued off sedation.  2. Neurology will follow more peripherally for now. Please call us back when the patient is extubated or on a trach collar, or sooner if his neurological condition deteriorates.   40 minutes spent in the neurological evaluation and management of this critically ill patient.    LOS: 12 days   _0  signed: Dr. Kerney Elbe 05/09/2019  9:42 AM

## 2019-05-09 NOTE — Progress Notes (Signed)
NAME:  Troy Wells, MRN:  [id number], DOB:  [birth year], LOS: 12 ADMISSION DATE:  04/27/2019, CONSULTATION DATE:  11/10 REFERRING MD:  Johnney Killian, CHIEF COMPLAINT:  Cardiac arrest   Brief History   53 y/o male with ESRD and COVID 19 had a cardiac arrest at dialysis, 12 minutes.  History of present illness   Unable to provide history due to critical illness. History obtained by chart review.  53 year old male with ESRD on HD who presents after syncopal episode and found in PEA arrest. Patient presented to dialysis center and on arrival complained of shortness of breath. He was found pale, diaphoretic and hypoxemic to 71% and bradycardic to 40s. During transport, he went into PEA arrest and received CPR for 13 minutes with epi x1, calcium and 1/2 amp sodium bicarb. King airway was placed in the field. On arrival to the ED, kingairway was exchanged for ETT. PCCM consulted for admission.  Of note, patient had recent admission for COVID-19 pneumonia and treated with oxygen, steroids, remdsivir and decadron. Hospital admission significant for NSTEMI and reduced EF. He was scheduled to follow-up with Cardiology to consider outpatient nuclear test. On discharge, he was able to maintain saturations on room air.   Past Medical History   has a past medical history of CARDIAC ARREST (11/01/2009), Colon polyps (01/02/2016), Coronary artery disease (08/15/2009), Dialysis patient [facility]) (09-27-2014), Diverticulosis of colon without hemorrhage (01/02/2016), DM (diabetes mellitus), type 2 with renal complications ([place]) (6/94/5038), Essential hypertension (11/01/2009), Hyperlipidemia, Hypertension, Mitral valve regurgitation (09/19/2015), OSA treated with BiPAP (06/17/2010), and Sleep apnea.   Significant Hospital Events   11/10 Admitted 11/11Myoclonic activity, HD with 2.2L removed 11/12 Fentanyl100 mcg's. Any stimulation, he has abnormal motor movements, nofollow commands.  11/13 Fentanyl 100 mcg's, doppler  negative, heparin gtt stopped 11/14 PM had severe hypoxemia, flash edema, emergent bronchoscopy frothy secretions 11/16 MRI concerning for anoxic injury 11/17 waking up. Follows commands. Wean 5/5 for several hours. 11/18 decompensation. Back on sedation and full support. Start CRRT  Consults:  PCCM neurology Nephrology  Procedures:  ETT 11/10 >> R IJ CVL 11/14 >  Significant Diagnostic Tests:  CXR 11/10 > Bilateral airspace disease suggestive of volume overload CXR 11/19 > significantly improved aeration, improvement in aeration.  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 Artifact or remote thalamic insults are felt less likely. No other acute intracranial abnormality identified. Chronic right PICA cerebellar infarct   Micro Data:  SARS COV 2 10/28 > positive BCx2 11/10 >> negative  Antimicrobials:  Vanc 11/18 -  Zosyn 11/18 -    Interim history/subjective:  Sedated and intubated, remains unresponsive  Objective   Blood pressure (!) 166/70, pulse (!) 103, temperature 98.8 F (37.1 C), temperature source Oral, resp. rate (!) 27, height 5\' 10"  (1.778 m), weight 110.5 kg, SpO2 95 %. CVP:  [8 mmHg-13 mmHg] 8 mmHg  Vent Mode: PRVC FiO2 (%):  [40 %] 40 % Set Rate:  [30 bmp] 30 bmp Vt Set:  [430 mL] 430 mL PEEP:  [5 cmH20-8 cmH20] 8 cmH20 Plateau Pressure:  [12 cmH20-16 cmH20] 16 cmH20   Intake/Output  Summary (Last 24 hours) at 05/09/2019 0809 Last data filed at 05/09/2019 0800 Gross per 24 hour  Intake 2520.13 ml  Output 5144 ml   Net -2623.87 ml   Filed Weights   05/07/19 0500 05/08/19 0444 05/09/19 0358  Weight: 118.7 kg 111.8 kg 110.5 kg   Examination: General: Sedate, intubated, unresponsive, not following commands HENT: [place]/AT, PERRL, EOM-spontaneous and MMM, ETT in place Lungs: Coarse BS diffusely Cardiovascular: RRR, Nl S1/S2 and -M/R/G Abdomen: Soft, NT, ND and +BS Extremities: -edema and -tenderness GU: foley in place.  Neuro: sedated.  Grimace to pain but not following commands  I reviewed CXR myself, ETT is in a good position  Discussed with bedside RN, will hold sedation for neuro to evaluate patient  Resolved Hospital Problem list     Assessment & Plan:  ASSESSMENT / PLAN:  Acute encephalopathic secondary anoxic brain injury, sepsis Sedation for mechanical ventilation PAD protocol for RASS goal -4 while on paralytic protocol Versed and Dilaudid gtt - Maintain on keppra - Change versed to PRN pushes - D/C Dilaudid drip and change to fentanyl for half life concerns  Acute respiratory failure/ARDS +/- flash pulmonary edema - CXR on 11/20 improved from previous.   - Full vent support - Decrease PEEP to 5 - ARDS protocol - CRRT volume negative - Abx as above - Not a trach candidate given neuro status unless neuro prognostication improves  COVID-19 pneumonia +/- comitant bacterial pneumonia. Trach asp with few GNR Continue Vanc and Zosyn Follow-up trach aspirate If patient re-fevers, will repeat blood cultures  Hypertensive emergency - resolved, now on levophed Arrhythmia - seen on telemetry. normal rhythm on EKG performed last night.   Acute systolic and diastolic heart failure Levophed for BP support Diureses via CRRT Monitor for arrhythmia  Acute blood loss anemia - likely iatrogenic, critically ill.  S/p 1 U pRBC. hgb 8.7 currently.  - Resume heparin prophylaxis  ESRD Secondary hyperparathyroidism Hyperkalemia  On CRRT currently.  Appreciate Nephrology involvement  Continue to monitor renal function/electrolytes daily.  renvela TID  Spoke with wife and daughters over the phone, I do not believe they understand the gravity of his situation, will likely need a family meeting after neuro completes their prognostication pending their decision on prognosis from a neuro standpoint.   Best practice:  Diet: tube feeds Pain/Anxiety/Delirium protocol (if indicated): as above VAP protocol (if indicated): yes DVT prophylaxis: sub Q hep per protocol GI prophylaxis: famotidine Glucose control: SSI Mobility: bed rest Code Status: full Family Communication: continue to update daughter.  Disposition: ICU  Labs   CBC: Recent Labs  Lab 05/03/19 0354  05/05/19 1600 05/06/19 0432 05/06/19 1533 05/07/19 0249 05/08/19 0405 05/09/19 0255 05/09/19 0312  WBC 6.0   < > 8.7 6.6  --  7.6 9.5 12.6*  --   NEUTROABS 4.0  --   --   --   --   --   --   --   --   HGB 5.8*   < > 7.1* 6.8* 8.8* 8.7* 8.3* 9.1* 13.3  HCT 17.7*   < > 20.9* 20.8* 26.6* 26.3* 26.2* 28.7* 39.0  MCV 84.3   < > 85.3 86.7  --  85.7 88.2 88.9  --   PLT 110*   < > 198 223  --  268 372 492*  --    < > = values in this interval not displayed.    Basic Metabolic Panel: Recent Labs  Lab 05/05/19 0353  05/06/19 0432  05/07/19 0249 05/07/19 1735 05/08/19 0405 05/08/19 1633 05/09/19 0255 05/09/19 0312  NA 138   < > 136   < > 137 134* 134* 135 133* 134*  K 5.8*   < > 5.5*   < > 5.8* 5.4* 4.8 5.0 4.6 4.6  CL 96*   < > 102   < > 100 100 100 99 99  --   CO2 27   < > 24   < > 22 22 23 27 24   --   GLUCOSE 113*   < > 148*   < > 125* 237* 220* 189* 170*  --   BUN 80*   < > 64*   < > 51* 48* 45* 42* 39*  --   CREATININE 8.24*   < > 5.21*   < > 3.66* 3.62* 3.19* 2.93* 2.82*  --   CALCIUM 9.7   < > 9.4   < > 10.1 9.7 10.2 10.6* 10.8*  --   MG 2.4  --  2.5*  --  2.6*  --  2.8*  --  2.8*  --   PHOS 8.2*   < > 5.0*   < > 5.3* 4.9* 4.7* 4.6 5.4*  --    < > = values in this interval not displayed.    GFR: Estimated Creatinine Clearance: 37.3 mL/min (A) (by C-G formula based on SCr of 2.82 mg/dL (H)). Recent Labs  Lab 05/06/19 0432 05/07/19 0249 05/08/19 0405 05/09/19 0255  WBC 6.6 7.6 9.5 12.6*    Liver Function Tests: Recent Labs  Lab 05/07/19 0249 05/07/19 1735 05/08/19 0405 05/08/19 1633 05/09/19 0255  ALBUMIN 2.3* 2.2* 2.2* 2.3* 2.5*   No results for input(s): LIPASE, AMYLASE in the last 168 hours. No results for input(s): AMMONIA in the last 168 hours.  ABG    Component Value Date/Time   PHART 7.369 05/09/2019 0312   PCO2ART 46.8 05/09/2019 0312   PO2ART 103.0 05/09/2019 0312   HCO3 27.0 05/09/2019 0312   TCO2 28 05/09/2019 0312   ACIDBASEDEF 9.0 (H) 05/01/2019 1501   O2SAT 98.0 05/09/2019 0312     Coagulation Profile: Recent Labs  Lab 05/05/19 0934  INR 1.1    Cardiac Enzymes: No results for input(s): CKTOTAL, CKMB, CKMBINDEX, TROPONINI in the last 168 hours.  HbA1C: Hemoglobin-A1c  Date/Time Value Ref Range Status  09/14/2009 09:43 AM 8.5 (H) 5.4 - 7.4 % Final   Hemoglobin A1C  Date/Time Value Ref Range Status  11/21/2016 02:33 PM 5.3  Final  04/02/2016 10:05 AM 6.1  Final   HbA1c, POC (controlled diabetic range)  Date/Time Value Ref Range Status  10/21/2018 11:25 AM 5.7 0.0 - 7.0 % Final   Hgb A1c MFr Bld  Date/Time Value Ref Range Status  04/14/2019 02:00 AM 5.8 (H) 4.8 - 5.6 % Final    Comment:    (NOTE) Pre diabetes:          5.7%-6.4% Diabetes:              >6.4% Glycemic control for   <7.0% adults with diabetes     CBG: Recent Labs  Lab 05/08/19 0305 05/08/19 0753 05/08/19 1128 05/08/19 1610 05/08/19 1932  GLUCAP 220* 210* 179* 173* 153*   The patient is critically ill with multiple organ systems failure and requires high complexity decision making for assessment and support, frequent evaluation and titration of therapies, application of advanced monitoring technologies and extensive interpretation of multiple  databases.   Critical Care Time devoted to  patient care services described in this note is  51  Minutes. This time reflects time of care of this signee Dr Jennet Maduro. This critical care time does not reflect procedure time, or teaching time or supervisory time of PA/NP/Med student/Med Resident etc but could involve care discussion time.  Rush Farmer, M.D. [facility] Pulmonary/Critical Care Medicine.

## 2019-05-09 NOTE — Progress Notes (Signed)
Wasted 25 ml of dilaudid down sink.  Benn Moulder, RN was my witness.

## 2019-05-09 NOTE — Progress Notes (Signed)
PCCM brief progress note  Sedation has been weaning 11/22. Patient waking up more. Precedex ordered to assist in effort to wean fentanyl, BZDs without depressing respiratory drive. Patient has responded well to these efforts. 1720,  attempted PSV/CPAP-- patient minute ventilation, tidals, rate are appropriate for consideration of extubation. Mental status is not optimized at this time, but I am encouraged by this weaning effort. Previous PRVC settings resumed.   -Continue efforts at weaning sedation.  -AM WUA/SBT -- My hope is that in AM we can wean some of the patient's remaining sedation off and rely on precedex to optimize mental status, attempt extubation -Will put stop time on EN overnight/early 11/23 AM. If patient is unable to extubate tomorrow, resume EN    Eliseo Gum MSN, AGACNP-BC [place] [phone] If no answer, [phone] 05/09/2019, 5:27 PM

## 2019-05-09 NOTE — Progress Notes (Signed)
Assisted tele visit to patient with family member.  Troy Wells M, RN  

## 2019-05-10 ENCOUNTER — Other Ambulatory Visit: Payer: Self-pay

## 2019-05-10 ENCOUNTER — Inpatient Hospital Stay (HOSPITAL_COMMUNITY): Payer: Medicare Other

## 2019-05-10 DIAGNOSIS — L8990 Pressure ulcer of unspecified site, unspecified stage: Secondary | ICD-10-CM | POA: Insufficient documentation

## 2019-05-10 DIAGNOSIS — L89154 Pressure ulcer of sacral region, stage 4: Secondary | ICD-10-CM | POA: Diagnosis present

## 2019-05-10 DIAGNOSIS — J9601 Acute respiratory failure with hypoxia: Secondary | ICD-10-CM | POA: Diagnosis not present

## 2019-05-10 LAB — POCT I-STAT 7, (LYTES, BLD GAS, ICA,H+H)
Acid-Base Excess: 2 mmol/L (ref 0.0–2.0)
Bicarbonate: 26.5 mmol/L (ref 20.0–28.0)
Calcium, Ion: 1.46 mmol/L — ABNORMAL HIGH (ref 1.15–1.40)
HCT: 27 % — ABNORMAL LOW (ref 39.0–52.0)
Hemoglobin: 9.2 g/dL — ABNORMAL LOW (ref 13.0–17.0)
O2 Saturation: 99 %
Patient temperature: 98.3
Potassium: 4.4 mmol/L (ref 3.5–5.1)
Sodium: 133 mmol/L — ABNORMAL LOW (ref 135–145)
TCO2: 28 mmol/L (ref 22–32)
pCO2 arterial: 40.4 mmHg (ref 32.0–48.0)
pH, Arterial: 7.424 (ref 7.350–7.450)
pO2, Arterial: 125 mmHg — ABNORMAL HIGH (ref 83.0–108.0)

## 2019-05-10 LAB — RENAL FUNCTION PANEL
Albumin: 2.6 g/dL — ABNORMAL LOW (ref 3.5–5.0)
Albumin: 2.8 g/dL — ABNORMAL LOW (ref 3.5–5.0)
Anion gap: 12 (ref 5–15)
Anion gap: 13 (ref 5–15)
BUN: 38 mg/dL — ABNORMAL HIGH (ref 6–20)
BUN: 35 mg/dL — ABNORMAL HIGH (ref 6–20)
CO2: 23 mmol/L (ref 22–32)
CO2: 24 mmol/L (ref 22–32)
Calcium: 11 mg/dL — ABNORMAL HIGH (ref 8.9–10.3)
Calcium: 11.7 mg/dL — ABNORMAL HIGH (ref 8.9–10.3)
Chloride: 97 mmol/L — ABNORMAL LOW (ref 98–111)
Chloride: 98 mmol/L (ref 98–111)
Creatinine, Ser: 2.77 mg/dL — ABNORMAL HIGH (ref 0.61–1.24)
Creatinine, Ser: 2.61 mg/dL — ABNORMAL HIGH (ref 0.61–1.24)
GFR calc Af Amer: 29 mL/min — ABNORMAL LOW (ref >60)
GFR calc Af Amer: 31 mL/min — ABNORMAL LOW (ref >60)
GFR calc non Af Amer: 25 mL/min — ABNORMAL LOW (ref >60)
GFR calc non Af Amer: 27 mL/min — ABNORMAL LOW (ref >60)
Glucose, Bld: 248 mg/dL — ABNORMAL HIGH (ref 70–99)
Glucose, Bld: 137 mg/dL — ABNORMAL HIGH (ref 70–99)
Phosphorus: 4.7 mg/dL — ABNORMAL HIGH (ref 2.5–4.6)
Phosphorus: 4.8 mg/dL — ABNORMAL HIGH (ref 2.5–4.6)
Potassium: 4.4 mmol/L (ref 3.5–5.1)
Potassium: 4.3 mmol/L (ref 3.5–5.1)
Sodium: 132 mmol/L — ABNORMAL LOW (ref 135–145)
Sodium: 135 mmol/L (ref 135–145)

## 2019-05-10 LAB — POCT ACTIVATED CLOTTING TIME
Activated Clotting Time: 180 seconds
Activated Clotting Time: 180 seconds
Activated Clotting Time: 186 seconds
Activated Clotting Time: 186 seconds
Activated Clotting Time: 191 seconds
Activated Clotting Time: 197 seconds
Activated Clotting Time: 186 seconds
Activated Clotting Time: 197 seconds
Activated Clotting Time: 197 seconds
Activated Clotting Time: 197 seconds
Activated Clotting Time: 208 seconds
Activated Clotting Time: 191 seconds
Activated Clotting Time: 208 seconds
Activated Clotting Time: 213 seconds

## 2019-05-10 LAB — TYPE AND SCREEN
ABO/RH(D): O POS
Antibody Screen: NEGATIVE
Unit division: 0

## 2019-05-10 LAB — BPAM RBC
Blood Product Expiration Date: 202012232359
ISSUE DATE / TIME: 202011191439
Unit Type and Rh: 5100

## 2019-05-10 LAB — CBC
HCT: 27.3 % — ABNORMAL LOW (ref 39.0–52.0)
Hemoglobin: 8.9 g/dL — ABNORMAL LOW (ref 13.0–17.0)
MCH: 28.5 pg (ref 26.0–34.0)
MCHC: 32.6 g/dL (ref 30.0–36.0)
MCV: 87.5 fL (ref 80.0–100.0)
Platelets: 567 10*3/uL — ABNORMAL HIGH (ref 150–400)
RBC: 3.12 MIL/uL — ABNORMAL LOW (ref 4.22–5.81)
RDW: 18.1 % — ABNORMAL HIGH (ref 11.5–15.5)
WBC: 14.5 10*3/uL — ABNORMAL HIGH (ref 4.0–10.5)
nRBC: 0.2 % (ref 0.0–0.2)

## 2019-05-10 LAB — GLUCOSE, CAPILLARY
Glucose-Capillary: 135 mg/dL — ABNORMAL HIGH (ref 70–99)
Glucose-Capillary: 136 mg/dL — ABNORMAL HIGH (ref 70–99)
Glucose-Capillary: 143 mg/dL — ABNORMAL HIGH (ref 70–99)
Glucose-Capillary: 201 mg/dL — ABNORMAL HIGH (ref 70–99)
Glucose-Capillary: 209 mg/dL — ABNORMAL HIGH (ref 70–99)

## 2019-05-10 LAB — APTT: aPTT: 116 seconds — ABNORMAL HIGH (ref 24–36)

## 2019-05-10 LAB — MAGNESIUM: Magnesium: 3 mg/dL — ABNORMAL HIGH (ref 1.7–2.4)

## 2019-05-10 LAB — HEMOGLOBIN AND HEMATOCRIT, BLOOD
HCT: 29.2 % — ABNORMAL LOW (ref 39.0–52.0)
Hemoglobin: 9.6 g/dL — ABNORMAL LOW (ref 13.0–17.0)

## 2019-05-10 MED ORDER — ASPIRIN 81 MG PO CHEW: 324.0000 mg | CHEWABLE_TABLET | Freq: Every day | ORAL | Status: DC

## 2019-05-10 MED ORDER — ATORVASTATIN CALCIUM 80 MG PO TABS
80.0000 mg | ORAL_TABLET | Freq: Every day | ORAL | Status: DC
  Administered 2019-05-11 – 2019-06-16 (×37): 80 mg via ORAL
  Filled 2019-05-10 (×5): qty 1
  Filled 2019-05-10: qty 2
  Filled 2019-05-10 (×2): qty 1
  Filled 2019-05-10: qty 2
  Filled 2019-05-10 (×14): qty 1
  Filled 2019-05-10: qty 2
  Filled 2019-05-10 (×8): qty 1
  Filled 2019-05-10: qty 2
  Filled 2019-05-10 (×3): qty 1

## 2019-05-10 MED ORDER — COLLAGENASE 250 UNIT/GM EX OINT
TOPICAL_OINTMENT | Freq: Every day | CUTANEOUS | Status: DC
  Administered 2019-05-10 – 2019-05-12 (×3): via TOPICAL
  Administered 2019-05-13: 1 via TOPICAL
  Filled 2019-05-10: qty 30

## 2019-05-10 NOTE — Progress Notes (Signed)
Assisted tele visit to patient with family member.  Leanthony Rhett M, RN  

## 2019-05-10 NOTE — H&P (Signed)
See consult note from Dr. Loanne Drilling 04/27/19.

## 2019-05-10 NOTE — Progress Notes (Signed)
Noted to have bleeding from unclear location.  HR increased.  Will check Hb/Hct.  Hold ASA, SQ heparin for now.  Chesley Mires, MD [facility] Pulmonary/Critical Care 05/10/2019, 5:47 PM

## 2019-05-10 NOTE — Progress Notes (Signed)
RN requested RT to assist in pt's room with A-line. A-line had came partially out and blood was all in the pt's bed. RT attempted to save right wrist a-line with no success through flushes, reposition and cleansing.

## 2019-05-10 NOTE — Progress Notes (Signed)
I wasted 150 mls fentanyl and 25 mls precedex and Balinda Quails witnessed

## 2019-05-10 NOTE — Progress Notes (Signed)
Assisted tele visit to patient with family member.  Hilda Rynders M, RN  

## 2019-05-10 NOTE — Progress Notes (Signed)
[facility] Progress Note  Subjective: pt seen in ICU, awake and responding minimally  Vitals:   05/10/19 1300 05/10/19 1315 05/10/19 1330 05/10/19 1345  BP: 116/79     Pulse: (!) 114 (!) 116 (!) 117 (!) 116  Resp: 18 (!) _0 Temp:      TempSrc:      SpO2: 98% (!) 85% 99% 98%  Weight:      Height:        Inpatient medications: . [START ON 05/11/2019] aspirin  324 mg Oral Daily  . [START ON 05/11/2019] atorvastatin  80 mg Oral Daily  . chlorhexidine gluconate (MEDLINE KIT)  15 mL Mouth Rinse BID  . Chlorhexidine Gluconate Cloth  6 each Topical Daily  . collagenase   Topical Daily  . darbepoetin (ARANESP) injection - DIALYSIS  200 mcg Intravenous Q Tue-HD  . heparin injection (subcutaneous)  5,000 Units Subcutaneous Q8H  . insulin aspart  2-6 Units Subcutaneous Q4H  . mouth rinse  15 mL Mouth Rinse 10 times per day  . sevelamer carbonate  0.8 g Oral TID   .  prismasol BGK 4/2.5 500 mL/hr at 05/10/19 1044  .  prismasol BGK 4/2.5 300 mL/hr at 05/10/19 1137  . albumin human    . cefTRIAXone (ROCEPHIN)  IV 1 g (05/10/19 1048)  . heparin 10,000 units/ 20 mL infusion syringe 1,500 Units/hr (05/10/19 1300)  . heparin    . levETIRAcetam 500 mg (05/10/19 1225)  . prismasol BGK 2/2.5 dialysis solution 2,000 mL/hr at 05/10/19 1333   acetaminophen, albumin human, heparin, heparin, heparin, hydrALAZINE, metoprolol tartrate    Exam:  R groin temp cath w/ crrt running  awake, tracks, speaks minimally, alert  no jvd   Chest clear ant// lat, no rales    Cor reg  Abd firm distended slightly, nontender, dec'd bs   Ext no pitting LE or UE edema    L arm AVF +bruit   Outpt HD:TTS- [facility] 4.5h F250 124.5kg 400/800 2/2.25 Hep 12,000 AVF - mircera 30 q4 last 10/13 - hect 4   Summary: Pt is a 53 y.o. yo male with ESRD who was admitted on 04/27/2019 with resp failure due to COVID/pulm edema and cardiac arrest    Assessment/Plan:  1. Resp  failure - extubated , looks pretty good in ICU 2. ESRD - normally TTS as OP via TDC. Had IHD Mon and Tuesday.  Decision made to put on CRRT on Wed 11/18 in response to him being catabolic with vent difficulties.  Labs improving and volume / wts improving and resp status better 3. Anemia- hgb dropping- s/p transfusion 11/16 and 11/19-  Increased his ESA- cont supportive care-  Suspect hg today is error (13) 4. Secondary hyperparathyroidism- normally on hect 4-- has not been given here-  Calcium corrected is high- on renvela for his phos but getting TF so difficult -  Phos down on CRRT  5. HTN/volume- vol overload seems to be improving and wt's are down significantly. Will cont -100 cc/hr for now but will reduce if BP's drop significantly and avoid using pressors to get more fluid off.  6. Question of anoxic brain injury-  is alert today and interacts somewhat    Rob Gray Maugeri 05/10/2019, 2:08 PM  Iron/TIBC/Ferritin/ %Sat No results found for: IRON, TIBC, FERRITIN, IRONPCTSAT Recent Labs  Lab 05/05/19 0934  05/10/19 0422 05/10/19 0424  NA  --    < > 132* 133*  K  --    < >  4.4 4.4  CL  --    < > 97*  --   CO2  --    < > 23  --   GLUCOSE  --    < > 248*  --   BUN  --    < > 38*  --   CREATININE  --    < > 2.77*  --   CALCIUM  --    < > 11.0*  --   PHOS  --    < > 4.7*  --   ALBUMIN  --    < > 2.6*  --   INR 1.1  --   --   --    < > = values in this interval not displayed.   No results for input(s): AST, ALT, ALKPHOS, BILITOT, PROT in the last 168 hours. Recent Labs  Lab 05/10/19 0422 05/10/19 0424  WBC 14.5*  --   HGB 8.9* 9.2*  HCT 27.3* 27.0*  PLT 567*  --

## 2019-05-10 NOTE — Progress Notes (Signed)
NAME:  Troy Wells, MRN:  [id number], DOB:  Jun 23, 1965, LOS: 29 ADMISSION DATE:  04/27/2019, CONSULTATION DATE:  11/10 REFERRING MD:  Johnney Killian, CHIEF COMPLAINT:  Cardiac arrest   Brief History   53 y/o male with ESRD and COVID 19 had a cardiac arrest at dialysis, 12 minutes.  History of present illness   Unable to provide history due to critical illness. History obtained by chart review.  53 year old male with ESRD on HD who presents after syncopal episode and found in PEA arrest. Patient presented to dialysis center and on arrival complained of shortness of breath. He was found pale, diaphoretic and hypoxemic to 71% and bradycardic to 40s. During transport, he went into PEA arrest and received CPR for 13 minutes with epi x1, calcium and 1/2 amp sodium bicarb. King airway was placed in the field. On arrival to the ED, kingairway was exchanged for ETT. PCCM consulted for admission.  Of note, patient had recent admission for COVID-19 pneumonia and treated with oxygen, steroids, remdsivir and decadron. Hospital admission significant for NSTEMI and reduced EF. He was scheduled to follow-up with Cardiology to consider outpatient nuclear test. On discharge, he was able to maintain saturations on room air.   Past Medical History   has a past medical history of CARDIAC ARREST (11/01/2009), Colon polyps (01/02/2016), Coronary artery disease (08/15/2009), Dialysis patient [facility]) (09-27-2014), Diverticulosis of colon without hemorrhage (01/02/2016), DM (diabetes mellitus), type 2 with renal complications ([place]) (07/05/4172), Essential hypertension (11/01/2009), Hyperlipidemia, Hypertension, Mitral valve regurgitation (09/19/2015), OSA treated with BiPAP (06/17/2010), and Sleep apnea.   Significant Hospital Events   11/10 Admitted 11/11Myoclonic activity, HD with 2.2L removed 11/12 Fentanyl100 mcg's. Any stimulation, he has abnormal motor movements, nofollow commands.  11/13 Fentanyl 100 mcg's, doppler  negative, heparin gtt stopped 11/14 PM had severe hypoxemia, flash edema, emergent bronchoscopy frothy secretions 11/16 MRI concerning for anoxic injury 11/17 waking up. Follows commands. Wean 5/5 for several hours. 11/18 decompensation. Back on sedation and full support. Start CRRT 11/23 - pt improving, now on PS ventilation only.   Consults:  PCCM neurology Nephrology  Procedures:  ETT 11/10 >> R IJ CVL 11/14 >  Significant Diagnostic Tests:  CXR 11/10 > Bilateral airspace disease suggestive of volume overload CXR 11/19 > significantly improved aeration, improvement in aeration.  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 Artifact or remote thalamic insults are felt less likely. No other acute intracranial abnormality identified. Chronic right PICA cerebellar infarct   Micro Data:  SARS COV 2 10/28 > positive BCx2 11/10 >> negative Trach aspirate 11/20>> few enterobacter cloacae  Antimicrobials:  Vanc 11/18 - 11/20 Zosyn 11/18 -  11/20 CTX - 11/20 - 11/22  Interim history/subjective:  Intubated. Opens eyes to voice. Able to shake/nod head to questioning. Denies pain.   Objective   Blood pressure (!) 160/73, pulse (!) 104, temperature 97.9 F (36.6 C), temperature source Oral, resp. rate 16, height 5\' 10"  (1.778 m), weight 108 kg, SpO2 100 %. CVP:  [8 mmHg-9 mmHg] 8 mmHg  Vent Mode: PSV;CPAP FiO2 (%):  [40 %] 40 % Set Rate:  [  30 bmp] 30 bmp Vt Set:  [430 mL] 430 mL PEEP:  [5 cmH20-7 cmH20] 5 cmH20 Pressure Support:  [5 cmH20] 5  cmH20 Plateau Pressure:  [18 cmH20-20 cmH20] 20 cmH20   Intake/Output Summary (Last 24 hours) at 05/10/2019 0915 Last data filed at 05/10/2019 0800 Gross per 24 hour  Intake 2299.76 ml  Output 4284 ml  Net -1984.24 ml   Filed Weights   05/08/19 0444 05/09/19 0358 05/10/19 0500  Weight: 111.8 kg 110.5 kg 108 kg   Examination: General: intubated.  Awake. rsponsive to questioning.  HENT: NCAT, MMM, ETT in place Lungs: LCTAB. No wheeze/crackles.  Cardiovascular: RRR. No murmurs.  Abdomen: soft, nontender. Normal bowel sounds.  Extremities: no pitting edema.  GU: foley in place Neuro: opens eyes to voice. No FND.   Resolved Hospital Problem list     Assessment & Plan:  ASSESSMENT / PLAN:  Acute encephalopathic secondary to  anoxic brain injury, sepsis - improving.  - off paralytics - weened off versed and fentanyl today.  - Maintain on keppra - Change versed to PRN pushes - neurology following peripheraly.  Contact when pt is extubated/on trach collar or if deteriorates.   Acute respiratory failure/ARDS +/- flash pulmonary edema - CXR on 11/22 showed improving aeration.  abg this am with normal pH and CO2. On PS mode. Peep of 5, 40% FIO2.  - SBT as able.  - finished ABx - Not a trach candidate given neuro status unless neuro prognostication improves  COVID-19 pneumonia +/- comitant bacterial pneumonia. Trach asp with few GNR - wbc trending up over last two days,  - Finished course of Abx - If patient re-fevers, will repeat blood cultures - daily cbc  Acute systolic and diastolic heart failure Diureses via CRRT Monitor for arrhythmia  Acute blood loss anemia w/ chronic anemia of CKD - likely iatrogenic, critically ill.  S/p 1 U pRBC. hgb stable  - Resume heparin prophylaxis - daily cbc  ESRD Secondary hyperparathyroidism - On CRRT currently.  - Appreciate Nephrology involvement - Continue to monitor renal function/electrolytes daily.  - renvela TID    Best practice:  Diet: tube feeds Pain/Anxiety/Delirium protocol (if indicated): as above VAP protocol (if indicated): yes DVT prophylaxis: sub Q hep per protocol GI prophylaxis: famotidine Glucose control: sSSI Mobility: bed rest Code Status: full Family Communication: continue to update daughter.  Disposition: ICU  Labs   CBC: Recent Labs  Lab 05/06/19 0432  05/07/19 0249 05/08/19 0405 05/09/19 0255 05/09/19 0312 05/10/19 0422 05/10/19 0424  WBC 6.6  --  7.6 9.5 12.6*  --  14.5*  --   HGB 6.8*   < > 8.7* 8.3* 9.1* 13.3 8.9* 9.2*  HCT 20.8*   < > 26.3* 26.2* 28.7* 39.0 27.3* 27.0*  MCV 86.7  --  85.7 88.2 88.9  --  87.5  --   PLT 223  --  268 372 492*  --  567*  --    < > = values in this interval not displayed.    Basic Metabolic Panel: Recent Labs  Lab 05/06/19 0432  05/07/19 0249  05/08/19 0405 05/08/19 1633 05/09/19 0255 05/09/19 0312 05/09/19 1735 05/10/19 0422 05/10/19 0424  NA 136   < > 137   < > 134* 135 133* 134* 136 132* 133*  K 5.5*   < > 5.8*   < > 4.8 5.0 4.6 4.6 4.0 4.4 4.4  CL 102   < > 100   < > 100 99 99  --  98 97*  --   CO2 24   < > 22   < > 23 27 24   --  24 23  --   GLUCOSE 148*   < > 125*   < > 220* 189* 170*  --  190* 248*  --   BUN 64*   < > 51*   < > 45* 42* 39*  --  30* 38*  --   CREATININE 5.21*   < > 3.66*   < > 3.19* 2.93* 2.82*  --  2.20* 2.77*  --   CALCIUM 9.4   < > 10.1   < > 10.2 10.6* 10.8*  --  11.0* 11.0*  --   MG 2.5*  --  2.6*  --  2.8*  --  2.8*  --   --  3.0*  --   PHOS 5.0*   < > 5.3*   < > 4.7* 4.6 5.4*  --  3.6 4.7*  --    < > = values in this interval not displayed.   GFR: Estimated Creatinine Clearance: 37.5 mL/min (A) (by C-G formula based on SCr of 2.77 mg/dL (H)). Recent Labs  Lab 05/07/19 0249 05/08/19 0405 05/09/19 0255 05/10/19 0422  WBC 7.6 9.5 12.6* 14.5*    Liver Function Tests: Recent Labs  Lab 05/08/19 0405 05/08/19 1633 05/09/19 0255 05/09/19 1735 05/10/19 0422  ALBUMIN 2.2* 2.3* 2.5*  2.8* 2.6*   No results for input(s): LIPASE, AMYLASE in the last 168 hours. No results for input(s): AMMONIA in the last 168 hours.  ABG    Component Value Date/Time   PHART 7.424 05/10/2019 0424   PCO2ART 40.4 05/10/2019 0424   PO2ART 125.0 (H) 05/10/2019 0424   HCO3 26.5 05/10/2019 0424   TCO2 28 05/10/2019 0424   ACIDBASEDEF 9.0 (H) 05/01/2019 1501   O2SAT 99.0 05/10/2019 0424     Coagulation Profile: Recent Labs  Lab 05/05/19 0934  INR 1.1    Cardiac Enzymes: No results for input(s): CKTOTAL, CKMB, CKMBINDEX, TROPONINI in the last 168 hours.  HbA1C: Hemoglobin-A1c  Date/Time Value Ref Range Status  09/14/2009 09:43 AM 8.5 (H) 5.4 - 7.4 % Final   Hemoglobin A1C  Date/Time Value Ref Range Status  11/21/2016 02:33 PM 5.3  Final  04/02/2016 10:05 AM 6.1  Final   HbA1c, POC (controlled diabetic range)  Date/Time Value Ref Range Status  10/21/2018 11:25 AM 5.7 0.0 - 7.0 % Final   Hgb A1c MFr Bld  Date/Time Value Ref Range Status  04/14/2019 02:00 AM 5.8 (H) 4.8 - 5.6 % Final    Comment:    (NOTE) Pre diabetes:          5.7%-6.4% Diabetes:              >6.4% Glycemic control for   <7.0% adults with diabetes     CBG: Recent Labs  Lab 05/09/19 1533 05/09/19 1923 05/10/19 0013 05/10/19 0416 05/10/19 0813  GLUCAP [place], PGY-2 Family medicine residency

## 2019-05-10 NOTE — Consult Note (Addendum)
WOC Nurse Consult Note: Patient receiving care in [facility] 2M11.  Consult completed remotely via [facility] with assistance of primary RN, Silva Bandy. Patient is COVID +. Reason for Consult: left shoulder wound Wound type: unclear etiology at this time. Measurement: Roughly 4 inches by 4 inches Wound bed: pink, yellow, green Drainage (amount, consistency, odor) yellow/green Periwound: hypopigmented Dressing procedure/placement/frequency: Apply Santyl to the wound on the left anterior upper shoulder area in a nickel thick layer. Cover with a saline moistened gauze, then dry gauze or ABD pad.  Change daily.  Monitor the wound area(s) for worsening of condition such as: Signs/symptoms of infection,  Increase in size,  Development of or worsening of odor, Development of pain, or increased pain at the affected locations.  Notify the medical team if any of these develop.  Thank you for the consult.  Discussed plan of care with the patient and bedside nurse.  [place] nurse will not follow at this time.  Please re-consult the [facility] team if needed.  Val Riles, RN, MSN, CWOCN, CNS-BC, pager [phone]

## 2019-05-10 NOTE — Procedures (Signed)
Extubation Procedure Note  Patient Details:   Name: Linwood Gullikson DOB: [birth year] MRN: [id number]   Airway Documentation:    Vent end date: 05/10/19 Vent end time: 1036   Evaluation  O2 sats: stable throughout Complications: No apparent complications Patient did tolerate procedure well. Bilateral Breath Sounds: Diminished, Rhonchi   Yes   Pt extubated per MD order and placed on 6L bubble humidifier with SATs of 97%-100%. Will wean O2 as tolerated. Pt had a positive cuff leak noted prior to procedure. RN and RT at bedside. No apparent complications. Pt was able to state name and open mouth when directed.   Esperanza Sheets T 05/10/2019, 10:44 AM

## 2019-05-10 NOTE — Progress Notes (Signed)
I found arterial site bleeding in bed  And called RT to assess. H&H sent per orders. Resulted 9.6/29.2

## 2019-05-11 DIAGNOSIS — J9601 Acute respiratory failure with hypoxia: Secondary | ICD-10-CM | POA: Diagnosis not present

## 2019-05-11 LAB — GLUCOSE, CAPILLARY
Glucose-Capillary: 122 mg/dL — ABNORMAL HIGH (ref 70–99)
Glucose-Capillary: 120 mg/dL — ABNORMAL HIGH (ref 70–99)
Glucose-Capillary: 118 mg/dL — ABNORMAL HIGH (ref 70–99)
Glucose-Capillary: 250 mg/dL — ABNORMAL HIGH (ref 70–99)
Glucose-Capillary: 41 mg/dL — CL (ref 70–99)
Glucose-Capillary: 158 mg/dL — ABNORMAL HIGH (ref 70–99)
Glucose-Capillary: 138 mg/dL — ABNORMAL HIGH (ref 70–99)
Glucose-Capillary: 132 mg/dL — ABNORMAL HIGH (ref 70–99)

## 2019-05-11 LAB — RENAL FUNCTION PANEL
Albumin: 2.9 g/dL — ABNORMAL LOW (ref 3.5–5.0)
Anion gap: 14 (ref 5–15)
BUN: 32 mg/dL — ABNORMAL HIGH (ref 6–20)
CO2: 26 mmol/L (ref 22–32)
Calcium: 12 mg/dL — ABNORMAL HIGH (ref 8.9–10.3)
Chloride: 97 mmol/L — ABNORMAL LOW (ref 98–111)
Creatinine, Ser: 2.53 mg/dL — ABNORMAL HIGH (ref 0.61–1.24)
GFR calc Af Amer: 32 mL/min — ABNORMAL LOW (ref >60)
GFR calc non Af Amer: 28 mL/min — ABNORMAL LOW (ref >60)
Glucose, Bld: 132 mg/dL — ABNORMAL HIGH (ref 70–99)
Phosphorus: 4.3 mg/dL (ref 2.5–4.6)
Potassium: 4.1 mmol/L (ref 3.5–5.1)
Sodium: 137 mmol/L (ref 135–145)

## 2019-05-11 LAB — HEMOGLOBIN AND HEMATOCRIT, BLOOD
HCT: 29.5 % — ABNORMAL LOW (ref 39.0–52.0)
Hemoglobin: 9.7 g/dL — ABNORMAL LOW (ref 13.0–17.0)

## 2019-05-11 LAB — POCT ACTIVATED CLOTTING TIME
Activated Clotting Time: 208 seconds
Activated Clotting Time: 213 seconds
Activated Clotting Time: 202 seconds

## 2019-05-11 LAB — MAGNESIUM: Magnesium: 3.2 mg/dL — ABNORMAL HIGH (ref 1.7–2.4)

## 2019-05-11 LAB — APTT: aPTT: 148 seconds — ABNORMAL HIGH (ref 24–36)

## 2019-05-11 MED ORDER — ASPIRIN 81 MG PO CHEW
81.0000 mg | CHEWABLE_TABLET | Freq: Every day | ORAL | Status: DC
  Administered 2019-05-11 – 2019-06-16 (×36): 81 mg via ORAL
  Filled 2019-05-11 (×36): qty 1

## 2019-05-11 MED ORDER — LEVETIRACETAM IN NACL 500 MG/100ML IV SOLN
500.0000 mg | INTRAVENOUS | Status: DC
  Filled 2019-05-11: qty 100

## 2019-05-11 MED ORDER — HEPARIN SODIUM (PORCINE) 5000 UNIT/ML IJ SOLN
5000.0000 [IU] | Freq: Three times a day (TID) | INTRAMUSCULAR | Status: DC
  Administered 2019-05-11 – 2019-06-16 (×95): 5000 [IU] via SUBCUTANEOUS
  Filled 2019-05-11 (×96): qty 1

## 2019-05-11 MED ORDER — DEXTROSE 50 % IV SOLN
25.0000 g | INTRAVENOUS | Status: AC
  Administered 2019-05-11: 25 g via INTRAVENOUS

## 2019-05-11 MED ORDER — DEXTROSE 50 % IV SOLN
INTRAVENOUS | Status: AC
  Filled 2019-05-11: qty 50

## 2019-05-11 NOTE — Progress Notes (Signed)
NAME:  Troy Wells, MRN:  [id number], DOB:  [birth year], LOS: 31 ADMISSION DATE:  04/27/2019, CONSULTATION DATE:  11/10 REFERRING MD:  Johnney Killian, CHIEF COMPLAINT:  Cardiac arrest   Brief History   53 y/o male with ESRD and COVID 19 had a cardiac arrest at dialysis, 12 minutes.  History of present illness   Unable to provide history due to critical illness. History obtained by chart review.  53 year old male with ESRD on HD who presents after syncopal episode and found in PEA arrest. Patient presented to dialysis center and on arrival complained of shortness of breath. He was found pale, diaphoretic and hypoxemic to 71% and bradycardic to 40s. During transport, he went into PEA arrest and received CPR for 13 minutes with epi x1, calcium and 1/2 amp sodium bicarb. King airway was placed in the field. On arrival to the ED, kingairway was exchanged for ETT. PCCM consulted for admission.  Of note, patient had recent admission for COVID-19 pneumonia and treated with oxygen, steroids, remdsivir and decadron. Hospital admission significant for NSTEMI and reduced EF. He was scheduled to follow-up with Cardiology to consider outpatient nuclear test. On discharge, he was able to maintain saturations on room air.   Past Medical History   has a past medical history of CARDIAC ARREST (11/01/2009), Colon polyps (01/02/2016), Coronary artery disease (08/15/2009), Dialysis patient [facility]) (09-27-2014), Diverticulosis of colon without hemorrhage (01/02/2016), DM (diabetes mellitus), type 2 with renal complications ([place]) (8/52/7782), Essential hypertension (11/01/2009), Hyperlipidemia, Hypertension, Mitral valve regurgitation (09/19/2015), OSA treated with BiPAP (06/17/2010), and Sleep apnea.   Significant Hospital Events   11/10 Admitted 11/11Myoclonic activity, HD with 2.2L removed 11/12 Fentanyl100 mcg's. Any stimulation, he has abnormal motor movements, nofollow commands.  11/13 Fentanyl 100 mcg's, doppler  negative, heparin gtt stopped 11/14 PM had severe hypoxemia, flash edema, emergent bronchoscopy frothy secretions 11/16 MRI concerning for anoxic injury 11/17 waking up. Follows commands. Wean 5/5 for several hours. 11/18 decompensation. Back on sedation and full support. Start CRRT 11/23 - pt extubated  Consults:  PCCM neurology Nephrology  Procedures:  ETT 11/10 >> R IJ CVL 11/14 >  Significant Diagnostic Tests:  CXR 11/10 > Bilateral airspace disease suggestive of volume overload CXR 11/19 > significantly improved aeration, improvement in aeration.  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 Artifact or remote thalamic insults are felt less likely. No other acute intracranial abnormality identified. Chronic right PICA cerebellar infarct  Micro Data:  SARS COV 2 10/28 > positive BCx2 11/10 >> negative Trach aspirate 11/20>> few enterobacter cloacae  Antimicrobials:  Vanc 11/18 - 11/20 Zosyn 11/18 -  11/20 CTX - 11/20 - 11/24  Interim history/subjective:  Pt denies HA, Chest pain, abdominal pain.  Understands he is in the hospital but does not know what year it is.  When asked if he has any questions for me he stated 'yes', but then could not come up with a question afterwards.   Objective   Blood pressure (!) 89/59, pulse (!) 115, temperature 98.3 F (36.8 C), temperature source Axillary, resp. rate (!) 23, height 5\' 10"  (1.778 m), weight  102.4 kg, SpO2 100 %.        Intake/Output Summary (Last 24 hours) at 05/11/2019 0753 Last  data filed at 05/11/2019 0700 Gross per 24 hour  Intake 390.21 ml  Output 3100 ml  Net -2709.79 ml   Filed Weights   05/09/19 0358 05/10/19 0500 05/11/19 0450  Weight: 110.5 kg 108 kg 102.4 kg   Examination: General: alert and oriented to person, place, but not year. No acute distress.  HENT: NCAT, MMM.   Lungs: LCTAB.  Taking shallow breaths at about 30 resp/min.  Cardiovascular: RRR. No murmurs.  Abdomen: soft, nontender. Normal bowel sounds.  Extremities: no pitting edema.  GU: foley in place.   Neuro:no FND.  Able to move hands and feet b/l. 4/5 strength.   Resolved Hospital Problem list     Assessment & Plan:  ASSESSMENT / PLAN:  Acute encephalopathic secondary to  anoxic brain injury, sepsis - improving. Pt able to answer yes/no. Oriented to person, place, not year. Generally slowed mentation/confusion.  - off paralytics, sedation.  - Maintain on keppra - neurology following peripheraly.  Contact when pt is extubated/on trach collar or if deteriorates.   Acute respiratory failure/ARDS +/- flash pulmonary edema - extubated yesterday.. Improving clinically.  On 4L [place] currently.  - last day CTX today  COVID-19 pneumonia +/- comitant bacterial pneumonia. Trach asp with few GNR - ABx ends today. Spoke with ID briefly, they believe pt is able to be taken off of airborne precautions given the time elapsed since first COVID positive test.  - daily cbc  Acute systolic and diastolic heart failure Diureses via CRRT Monitor for arrhythmia  Acute blood loss anemia w/ chronic anemia of CKD - likely iatrogenic, critically ill.  S/p 1 U pRBC. hgb stable  - Resume heparin prophylaxis - daily cbc  ESRD - once pt is off CRRT and on HD, he can transition out of the ICU.  Secondary hyperparathyroidism - On CRRT currently.  - Appreciate Nephrology involvement - Continue to monitor renal function/electrolytes daily.  - renvela TID   Best practice:  Diet: npo pending speech  eval Pain/Anxiety/Delirium protocol (if indicated): as above VAP protocol (if indicated): yes DVT prophylaxis: sub Q hep per protocol GI prophylaxis: famotidine Glucose control: sSSI Mobility: bed rest Code Status: full Family Communication: continue to update daughter.  Disposition: ICU  Labs   CBC: Recent Labs  Lab 05/06/19 0432  05/07/19 0249 05/08/19 0405 05/09/19 0255 05/09/19 0312 05/10/19 0422 05/10/19 0424 05/10/19 1747 05/11/19 0004  WBC 6.6  --  7.6 9.5 12.6*  --  14.5*  --   --   --   HGB 6.8*   < > 8.7* 8.3* 9.1* 13.3 8.9* 9.2* 9.6* 9.7*  HCT 20.8*   < > 26.3* 26.2* 28.7* 39.0 27.3* 27.0* 29.2* 29.5*  MCV 86.7  --  85.7 88.2 88.9  --  87.5  --   --   --   PLT 223  --  268 372 492*  --  567*  --   --   --    < > = values in this interval not displayed.    Basic Metabolic Panel: Recent Labs  Lab 05/07/19 0249  05/08/19 0405  05/09/19 0255  05/09/19 1735 05/10/19 0422 05/10/19 0424 05/10/19 1555 05/11/19 0445  NA 137   < > 134*   < > 133*   < > 136 132* 133* 135 137  K 5.8*   < > 4.8   < >  4.6   < > 4.0 4.4 4.4 4.3 4.1  CL 100   < > 100   < > 99  --  98 97*  --  98 97*  CO2 22   < > 23   < > 24  --  24 23  --  24 26  GLUCOSE 125*   < > 220*   < > 170*  --  190* 248*  --  137* 132*  BUN 51*   < > 45*   < > 39*  --  30* 38*  --  35* 32*  CREATININE 3.66*   < > 3.19*   < > 2.82*  --  2.20* 2.77*  --  2.61* 2.53*  CALCIUM 10.1   < > 10.2   < > 10.8*  --  11.0* 11.0*  --  11.7* 12.0*  MG 2.6*  --  2.8*  --  2.8*  --   --  3.0*  --   --  3.2*  PHOS 5.3*   < > 4.7*   < > 5.4*  --  3.6 4.7*  --  4.8* 4.3   < > = values in this interval not displayed.   GFR: Estimated Creatinine Clearance: 40 mL/min (A) (by C-G formula based on SCr of 2.53 mg/dL (H)). Recent Labs  Lab 05/07/19 0249 05/08/19 0405 05/09/19 0255 05/10/19 0422  WBC 7.6 9.5 12.6* 14.5*    Liver Function Tests: Recent Labs  Lab 05/09/19 0255 05/09/19 1735 05/10/19 0422 05/10/19 1555  05/11/19 0445  ALBUMIN 2.5* 2.8* 2.6* 2.8* 2.9*   No results for input(s): LIPASE, AMYLASE in the last 168 hours. No results for input(s): AMMONIA in the last 168 hours.  ABG    Component Value Date/Time   PHART 7.424 05/10/2019 0424   PCO2ART 40.4 05/10/2019 0424   PO2ART 125.0 (H) 05/10/2019 0424   HCO3 26.5 05/10/2019 0424   TCO2 28 05/10/2019 0424   ACIDBASEDEF 9.0 (H) 05/01/2019 1501   O2SAT 99.0 05/10/2019 0424     Coagulation Profile: Recent Labs  Lab 05/05/19 0934  INR 1.1    Cardiac Enzymes: No results for input(s): CKTOTAL, CKMB, CKMBINDEX, TROPONINI in the last 168 hours.  HbA1C: Hemoglobin-A1c  Date/Time Value Ref Range Status  09/14/2009 09:43 AM 8.5 (H) 5.4 - 7.4 % Final   Hemoglobin A1C  Date/Time Value Ref Range Status  11/21/2016 02:33 PM 5.3  Final  04/02/2016 10:05 AM 6.1  Final   HbA1c, POC (controlled diabetic range)  Date/Time Value Ref Range Status  10/21/2018 11:25 AM 5.7 0.0 - 7.0 % Final   Hgb A1c MFr Bld  Date/Time Value Ref Range Status  04/14/2019 02:00 AM 5.8 (H) 4.8 - 5.6 % Final    Comment:    (NOTE) Pre diabetes:          5.7%-6.4% Diabetes:              >6.4% Glycemic control for   <7.0% adults with diabetes     CBG: Recent Labs  Lab 05/10/19 0013 05/10/19 0416 05/10/19 0813 05/10/19 1112 05/10/19 [place], PGY-2 Family medicine residency

## 2019-05-11 NOTE — Progress Notes (Signed)
[facility] Progress Note  Subjective: data taken from chart and PMD,  Patient not examined today directly given COVID-19 + status, utilizing exam of the primary team and observations of RN's.    Vitals:   05/11/19 1100 05/11/19 1115 05/11/19 1130 05/11/19 1145  BP:   90/69   Pulse:  95 96   Resp: (!) 24 (!) 28 (!) 24 (!) 26  Temp: (!) 97.5 F (36.4 C)     TempSrc: Axillary     SpO2:  100% 100%   Weight:      Height:        Inpatient medications: . aspirin  81 mg Oral Daily  . atorvastatin  80 mg Oral Daily  . Chlorhexidine Gluconate Cloth  6 each Topical Daily  . collagenase   Topical Daily  . darbepoetin (ARANESP) injection - DIALYSIS  200 mcg Intravenous Q Tue-HD  . heparin injection (subcutaneous)  5,000 Units Subcutaneous Q8H  . insulin aspart  2-6 Units Subcutaneous Q4H  . sevelamer carbonate  0.8 g Oral TID   .  prismasol BGK 4/2.5 500 mL/hr at 05/11/19 0658  .  prismasol BGK 4/2.5 300 mL/hr at 05/11/19 0339  . albumin human    . cefTRIAXone (ROCEPHIN)  IV 1 g (05/10/19 1048)  . heparin 10,000 units/ 20 mL infusion syringe 1,500 Units/hr (05/11/19 1100)  . heparin    . levETIRAcetam 500 mg (05/10/19 2307)  . prismasol BGK 2/2.5 dialysis solution 2,000 mL/hr at 05/11/19 8101   acetaminophen, albumin human, heparin, heparin, heparin, hydrALAZINE, metoprolol tartrate    Exam:  R groin temp cath w/ crrt running  awake, tracks, speaks minimally, alert  no jvd   Chest clear ant// lat, no rales    Cor reg  Abd firm distended slightly, nontender, dec'd bs   Ext no pitting LE or UE edema    L arm AVF +bruit   Outpt HD:TTS- [facility] 4.5h F250 124.5kg 400/800 2/2.25 Hep 12,000 AVF - mircera 30 q4 last 10/13 - hect 4   Summary: Pt is a 53 y.o. yo male with ESRD who was admitted on 04/27/2019 with resp failure due to COVID/pulm edema and cardiac arrest    Assessment/Plan:  1. Resp failure - extubated now 2. ESRD - normally TTS  as OP via TDC. Had IHD Mon and Tuesday.  Decision made to put on CRRT on Wed 11/18 in response to him being catabolic with vent difficulties.  Labs improving and volume / wts improving and resp status better. Down 15L per I/O's w/ CRRT. Will dc today.  Plan iHD, can go upstairs as his COVID precautions are now dc'd per ID. Plan next HD wed vs Friday, prob Friday.  3. Anemia- hgb dropping- s/p transfusion 11/16 and 11/19-  Increased his ESA- cont supportive care 4. Secondary hyperparathyroidism- normally on hect 4-- has not been given here-  Calcium corrected is high- on renvela for his phos but getting TF so difficult -  Phos down on CRRT  5. HTN/volume- marked vol reduction on CRRT x 6 days , down 15L and wt's reflect that. [place] CRRT today.  6. Question of anoxic brain injury-  is alert today and interacts somewhat    [place] Wells 05/11/2019, 12:06 PM  Iron/TIBC/Ferritin/ %Sat No results found for: IRON, TIBC, FERRITIN, IRONPCTSAT Recent Labs  Lab 05/05/19 0934  05/11/19 0445  NA  --    < > 137  K  --    < > 4.1  CL  --    < >  97*  CO2  --    < > 26  GLUCOSE  --    < > 132*  BUN  --    < > 32*  CREATININE  --    < > 2.53*  CALCIUM  --    < > 12.0*  PHOS  --    < > 4.3  ALBUMIN  --    < > 2.9*  INR 1.1  --   --    < > = values in this interval not displayed.   No results for input(s): AST, ALT, ALKPHOS, BILITOT, PROT in the last 168 hours. Recent Labs  Lab 05/10/19 0422  05/11/19 0004  WBC 14.5*  --   --   HGB 8.9*   < > 9.7*  HCT 27.3*   < > 29.5*  PLT 567*  --   --    < > = values in this interval not displayed.

## 2019-05-11 NOTE — Discharge Summary (Deleted)
[facility] Discharge Summary  Patient name: Troy Wells Medical record number: [id number] Date of birth: April 25, 1966 Age: 53 y.o. Gender: male Date of Admission: 04/27/2019   Date of Discharge:06/15/2019 Admitting Physician: Chi Rodman Pickle, MD  Primary Care Provider: [name], MD Consultants: CCM, neurology, nephrology,  Indication for Hospitalization: Cardiac arrest  Discharge Diagnoses/Problem List: Encephalopathy Anoxic brain injury PEA cardiac arrest Covid Enterobacter HAP ESRD HFmEF CAD Hypertension OSA Diabetes Hyperlipidemia Gout Sacral ulcer  Disposition: [facility]  Discharge Condition:  Stable  Discharge Exam:   General: Appears well, no acute distress. Age appropriate. Lying in bed on his right side Cardiac: RRR, normal heart sounds, no murmurs Respiratory: CTAB, normal effort Extremities: No edema or cyanosis. Skin: Warm and dry, no rashes noted Neuro: alert and oriented, no focal deficits Psych: normal affect  Brief Hospital Course:   ICU Overview  Patient had recently been hospitalized for Covid pneumonia discharged on November 3.  On November 10, the patient arrived to the emergency department from his dialysis center where he was complaining of shortness of breath and was found to be hypoxemic and bradycardic.  During initial transport to the ED he went into PEA arrest and was resuscitated after 30 minutes of CPR.  Patient was immediately transferred to ICU where he remained intubated and sedated for several days.  Patient had twitching concerning for seizure and neurology was consulted.  Patient was observed on continuous EEG which did not show seizure activity but did show nonspecific profound diffuse encephalopathy.  MRI of his brain did not show any acute abnormalities.  Patient was started on Keppra.  The patient's acute respiratory distress was believed to be multifactorial including Covid  infection, possible bacterial infection, and hypervolemia from ESRD.  He received Vancomycin and Zosyn, which was then switched to ceftriaxone for a total course of 7 days. Patient met ARDS criteria during hospitalization.  Patient remained intubated and sedated until 11/23.  After he was weaned off sedation patient was able to respond to voice and commands and answer yes/no questions, but exhibited some continued confusion/delayed mentation.  Patient continue to receive renal replacement therapy throughout his stay in the ICU.  He was transitioned from CRRT to HD on 11/24.  Patient was transferred from ICU to family medicine teaching service on 11/25.  Acute encephalopathy  Possible anoxic brain injury Following neurology recommendations, we continued supportive care this patient has a lengthy recovery however outcome is favorable.  Keppra was discontinued on 05/12/19 as he has no known seizure disorder. Repeat ammonia normalized. SLP evaluated patient and diet was advanced to dysphagia 3.  PT and OT evaluated patient and recommended inpatient rehab. 12/01: Episode of somnolence resolved. 12/02 but dangled oversized bed unwitnessed. CT head showed No acute intracranial abnormality. 12/03 concern for seizure like activity EEG w/o seizure activity. Neuro signed off. Medically stable for discharge. Neuorlogically plateaued. Attempted chair sitting for outpatient dialysis and was achieved. Family meeting 12/09, 12/12 to discuss goals of care. Continued with daily PT during admission until discharge.  COVID  Leukocytosis Patient was cleared by infectious disease for removal of contact and airborne precaution.  Leukocytosis downtrending. 12/02 febrile CXR showed Left lower lobe airspace opacity compatible with pneumonia. Cardiac enlargement without heart failure. Blood cultures without growth for 5 days. Did not require abx tx. COVID negative 05/24/2019. Repeat COVID negative on  06/14/2019.  ESRD Nephrology followed during his stay.  Patient resumed on dialysis on 05/14/19 and had regularly scheduled hemodialysis thereafter.  LUE/sacral ulcer 2/2 to being bed bound in ICU. Wounds covered with clean guaze with daily dressing changes. Hydrotherapy started 12/8 and required up to 7.86m Oxy IR for pain during hydrotherapy. Patient was turned every 2 hours. Patient did not tolerate hydrotherapy well so gen surgery was consulted for possible wound vac. Gen surgery recommendations included possible wound vac placement in Wound Care agreed.  Wound care noted that patient would not be a good candidate for wound vac given frequent stool incontinence, therefore this was not ordered. Plastic surgery was also consulted and noted that no surgical correction could be made due to location.  Nutrition was optimized.  Patient was given scheduled oxycodone 7.531m prior to HD.  Was able to tolerate sitting up in a chair for >4 hours prior to discharge.  Wound care instructions by wound care RN: Plan: Continue with twice daily NS moistened gauze covered with dry gauze and secured with silicone foam dressing. TUrn and reposition off of supine position and minimize time in the supine position.  Use pressure redistribution chair cushion when OOB to chair and shift position frequently.  Wound care instructions by plastic surgeon: Recommend continued use of air mattress. Continue with frequent turning of patient. Continue to strive to optimize nutritional status for wound healing including high protein intake. Protein shakes may be beneficial. Continue with current wound care.    Bilateral upper extremity tremor Patient with bilateral upper extremity tremor noted since his cardiac arrest.  Patient's daughter was initially very uncomfortable patient being discharged to skilled nursing facility while he had his bilateral upper extremity tremor.  After speaking with neurology they recommended scheduling  close outpatient follow-up as this would likely be a long-term issue caused by an anoxic brain injury.  Neurology also recommended starting patient on clonazepam 0.25 twice daily which can then be titrated up as needed.  Patient was started on this medication prior to discharge to skilled nursing facility and ambulatory referral to outpatient neurology was placed per their request.  Issues for Follow Up:  1. Stopped Amlodipine, gabapentin, glipizide, linagliptin, minoxidil and torsemide. Did not require during stay. 2. Needs daily wound care for sacral wound. 3. Dialysis TTS.  Consider oxycodone for pain prior to HD TTS given his sacral ulcer causing significant pain. 4. CPAP at night is encouraged 5. Neurology follow-up for bilateral upper extremity tremor, patient started on clonazepam 0.25 mg twice daily per neurology prior to discharge. 6. Continued wound care as above  Significant Procedures: ETT on 11/10; right IJ on 11/14 EEG: w/o seizure or epileptiform activity  Significant Labs and Imaging:  Recent Labs  Lab 06/10/19 0723 06/13/19 0809  WBC 7.5 7.5  HGB 8.8* 8.2*  HCT 28.0* 25.9*  PLT 388 354   Recent Labs  Lab 06/10/19 0815 06/12/19 0809 06/13/19 0223 06/15/19 0343  NA 132* 137 135 137  K 4.7 4.4 4.4 4.5  CL 94* 98 96* 91*  CO2 '24 25 25 30  ' GLUCOSE 116* 104* 127* 111*  BUN 61* 65* 95* 78*  CREATININE 9.50* 9.35* 10.84* 9.27*  CALCIUM 10.3 10.4* 10.4* 10.8*  PHOS 5.1* 3.6 2.7 3.2  ALBUMIN 2.7* 2.7* 2.6* 2.6*   Media Information   Document Information  Photos  05/25/2019  05/25/2019 12:42  Attached To:  Hospital Encounter on 04/27/19  Source Information  [facility]    Results/Tests Pending at Time of Discharge: N/a  Discharge Medications:  Allergies as of 06/15/2019   No  Known Allergies     Medication List    STOP taking these medications   amLODipine 10 MG tablet Commonly known as: NORVASC   gabapentin 300  MG capsule Commonly known as: NEURONTIN   glipiZIDE 5 MG tablet Commonly known as: GLUCOTROL   linagliptin 5 MG Tabs tablet Commonly known as: TRADJENTA   minoxidil 10 MG tablet Commonly known as: LONITEN   torsemide 100 MG tablet Commonly known as: DEMADEX     TAKE these medications   albuterol 108 (90 Base) MCG/ACT inhaler Commonly known as: VENTOLIN HFA Inhale 1-2 puffs into the lungs every 6 (six) hours as needed for wheezing or shortness of breath.   allopurinol 100 MG tablet Commonly known as: ZYLOPRIM Take 100 mg by mouth 2 (two) times daily.   aspirin 81 MG EC tablet Take 81 mg by mouth daily.   atorvastatin 80 MG tablet Commonly known as: LIPITOR Take 1 tablet (80 mg total) by mouth daily.   Auryxia 1 GM 210 MG(Fe) tablet Generic drug: ferric citrate Take 420-840 mg by mouth See admin instructions. 840 mg with meals, and 420 mg with snacks   carvedilol 6.25 MG tablet Commonly known as: COREG Take 1 tablet (6.25 mg total) by mouth 2 (two) times daily with a meal. What changed:   medication strength  how much to take   cetirizine 5 MG tablet Commonly known as: ZYRTEC Take 1 tablet (5 mg total) by mouth daily. What changed:   when to take this  reasons to take this   clonazePAM 0.25 MG disintegrating tablet Commonly known as: KLONOPIN Take 1 tablet (0.25 mg total) by mouth 2 (two) times daily.   colchicine 0.6 MG tablet Take 0.6 mg by mouth 2 (two) times daily as needed (gout).   mirtazapine 15 MG tablet Commonly known as: REMERON Take 1 tablet (15 mg total) by mouth at bedtime.   multivitamin Tabs tablet Take 1 tablet by mouth at bedtime.   PRESCRIPTION MEDICATION Pt has CPAP machine       Discharge Instructions: Please refer to Patient Instructions section of EMR for full details.  Patient was counseled important signs and symptoms that should prompt return to medical care, changes in medications, dietary instructions, activity  restrictions, and follow up appointments.   Follow-Up Appointments: Follow-up Information    [name], MD Follow up.   Specialty: Family Medicine Why: call after discharge from SNF for follow up. Contact information: [address] [phone]         Follow up with PCP pending SNF stay  Lurline Del, DO 06/15/2019, 12:18 PM PGY-1, [place]

## 2019-05-11 NOTE — Progress Notes (Signed)
Assisted tele visit to patient with family members.  Troy Wells M Kariel Skillman, RN   

## 2019-05-11 NOTE — Progress Notes (Signed)
Rehab Admissions Coordinator Note:  Per therapy recommendations, patient was screened by Michel Santee for appropriateness for an Inpatient Acute Rehab Consult.  Pt currently on CRRT and unable to participate fully in therapy.  Will follow for tolerance.   Michel Santee 05/11/2019, 2:30 PM  I can be reached at [phone].

## 2019-05-11 NOTE — Evaluation (Addendum)
Occupational Therapy Evaluation Patient Details Name: Troy Wells MRN: [id number] DOB: December 12, 1965 Today's Date: 05/11/2019    History of Present Illness 53 yo male presenting to [facility] with cardiac arrest and CPR initatied by EMS. Pt at dialysis, complained of SOB, and became unresponsive. Pt with prior hospital admission (10/27 - 11/3) for COVID associated pneumonia; active problems of acute hypoxemic respiratory failure NSTEMI during admission. Intubated 11/10-11/23. MRI showing bilateral medial thalami is suspicious for anoxic injury and chronic R PICA cerebellar infarct. PMH including ESRD on HD, chronic anemia, chronic systolic heart failure, CAD, HTN, OSA on CPAP, DM2, and HLD.    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 Pt currently requiring Max-Total A for ADLs at bed level; evaluation completed at bed level due to non-tunneled R femoral cath and CRRT. Pt participating in [place] presenting with significant weakness and fatigue. Pt participating in oral care with Max A for bilateral coordination and to bring mouth swab towards mouth; as task progressed, pt able to bring RUE to mouth with Min A. Pt very motivated despite pain and fatigue. Pt will require further acute OT to facilitate safe dc. Recommend dc to CIR for intensive OT to optimize safety, independence with ADLs, and return to PLOF.   VSS throughout on RA     Follow Up Recommendations  CIR;Supervision/Assistance - 24 hour    Equipment Recommendations  Other (comment)(Defer to next venue)    Recommendations for Other Services       Precautions / Restrictions Precautions Precautions: Other (comment) Precaution Comments: non tunneled R femoral artery cath, not past 90, CRRT Restrictions Weight Bearing Restrictions: No      Mobility Bed Mobility Overal bed mobility: Needs Assistance             General bed mobility comments: unable to attempt EoB  due to femoral cath  Transfers                      Balance                                           ADL either performed or assessed with clinical judgement   ADL Overall ADL's : Needs assistance/impaired Eating/Feeding: NPO;Bed level   Grooming: Oral care;Bed level;Maximal assistance;Minimal assistance Grooming Details (indicate cue type and reason): Max A to bring RUE to his face and to perform bilateral coorindation to hold cup with mouth wash and then dip swab into cup. With increased time, pt progressing towards bringing hand to mouth with Min A.  Upper Body Bathing: Maximal assistance;Bed level   Lower Body Bathing: Total assistance;Bed level   Upper Body Dressing : Maximal assistance;Bed level   Lower Body Dressing: Total assistance;Bed level   Toilet Transfer: Total assistance             General ADL Comments: Max-Total A for ADLs due to weakness and poor cognition.      Vision   Vision Assessment?: Vision impaired- to be further tested in functional context Additional Comments: Preference for left head turn and gaze. Pt able to look to right with visual and verbal cues.      Perception     Praxis      Pertinent Vitals/Pain Pain Assessment: Faces Faces Pain Scale: Hurts even more Pain Location: sacrum with SLR  of R LE Pain Descriptors / Indicators: Grimacing;Guarding;Moaning Pain Intervention(s): Monitored during session;Limited activity within patient's tolerance;Repositioned     Hand Dominance Right   Extremity/Trunk Assessment Upper Extremity Assessment Upper Extremity Assessment: RUE deficits/detail;LUE deficits/detail RUE Deficits / Details: WFL PROM. Poor grasp strength and gross motor. Pt participating in forward flex exercises at shoulder with assist. Pt able to lift his arm to place hand on stomach but difficulty following commands to perform ROM.  RUE Coordination: decreased fine motor;decreased gross motor LUE  Deficits / Details: Limited active movement of LUE. Poor grasp strength and gross motor strength LUE Coordination: decreased fine motor;decreased gross motor   Lower Extremity Assessment Lower Extremity Assessment: Defer to PT evaluation RLE Deficits / Details: able to initiate movement at hip, knee and anklle, obvious weakness RLE: Unable to fully assess due to immobilization(untunneled femoral calf) RLE Coordination: decreased fine motor;decreased gross motor LLE Deficits / Details: knee and ankle AAROM WFL, strength grossly 3/5, pain with hip flexion due to sacral pressure injury LLE: Unable to fully assess due to pain LLE Coordination: decreased fine motor;decreased gross motor   Cervical / Trunk Assessment Cervical / Trunk Assessment: (unable to assess)   Communication Communication Communication: Expressive difficulties(limited speaking, possibly due to prior intubation)   Cognition Arousal/Alertness: Awake/alert Behavior During Therapy: Flat affect Overall Cognitive Status: Difficult to assess Area of Impairment: Following commands;Attention;Problem solving                   Current Attention Level: Selective   Following Commands: Follows one step commands inconsistently;Follows one step commands with increased time     Problem Solving: Difficulty sequencing;Requires verbal cues;Requires tactile cues;Slow processing;Decreased initiation General Comments: limited amount of response to questions, improved as evaluation progressed, difficulty with initiation and sequencing of commands   General Comments  Pt on CRRT, able to maintain 100% SaO2 on RA      Exercises Exercises: General Upper Extremity General Exercises - Upper Extremity Shoulder Flexion: PROM;AAROM;Both;10 reps;Supine(AAROM RUE) General Exercises - Lower Extremity Ankle Circles/Pumps: AAROM;Both;10 reps;Supine Heel Slides: Right;AAROM;10 reps;Supine Straight Leg Raises: AAROM;Right;Limitations(unable  to complete due to increased sacral pain )   Shoulder Instructions      Home Living Family/patient expects to be discharged to:: Private residence Living Arrangements: Alone                                      Prior Functioning/Environment Level of Independence: Independent with assistive device(s)        Comments: 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        OT Problem List: Decreased strength;Decreased range of motion;Decreased activity tolerance;Impaired balance (sitting and/or standing);Decreased safety awareness;Decreased knowledge of use of DME or AE;Decreased knowledge of precautions;Impaired UE functional use;Cardiopulmonary status limiting activity;Pain      OT Treatment/Interventions: Self-care/ADL training;Therapeutic exercise;Energy conservation;DME and/or AE instruction;Therapeutic activities;Patient/family education    OT Goals(Current goals can be found in the care plan section) Acute Rehab OT Goals Patient Stated Goal: none stated OT Goal Formulation: With patient Time For Goal Achievement: 05/25/19 Potential to Achieve Goals: Good ADL Goals Pt Will Perform Grooming: with min assist;sitting;bed level Pt Will Transfer to Toilet: squat pivot transfer;bedside commode;with mod assist;with +2 assist Pt Will Perform Toileting - Clothing Manipulation and hygiene: with mod assist;sitting/lateral leans;sit to/from stand Pt/caregiver will Perform Home Exercise Program: Increased strength;Both right and left upper extremity;With minimal assist  Additional ADL Goal #1: Pt will perform bed mobility with Mod A +2 in preparation for ADLs Additional ADL Goal #2: Pt will tolerate sitting at EOB for ~ 47minutes with Mod A in preparation for ADLs  OT Frequency: Min 2X/week   Barriers to D/C:            Co-evaluation PT/OT/SLP Co-Evaluation/Treatment: Yes Reason for Co-Treatment: Necessary to address cognition/behavior during functional activity;To  address functional/ADL transfers PT goals addressed during session: Mobility/safety with mobility OT goals addressed during session: ADL's and self-care      AM-PAC OT "6 Clicks" Daily Activity     Outcome Measure Help from another person eating meals?: Total Help from another person taking care of personal grooming?: A Lot Help from another person toileting, which includes using toliet, bedpan, or urinal?: Total Help from another person bathing (including washing, rinsing, drying)?: Total Help from another person to put on and taking off regular upper body clothing?: Total Help from another person to put on and taking off regular lower body clothing?: Total 6 Click Score: 7   End of Session Nurse Communication: Mobility status  Activity Tolerance: Patient tolerated treatment well Patient left: in bed;with call bell/phone within reach;with bed alarm set;with restraints reapplied  OT Visit Diagnosis: Unsteadiness on feet (R26.81);Other abnormalities of gait and mobility (R26.89);Muscle weakness (generalized) (M62.81)                Time: 1610-9604 OT Time Calculation (min): 38 min Charges:  OT General Charges $OT Visit: 1 Visit OT Evaluation $OT Eval High Complexity: 1 High OT Treatments $Self Care/Home Management : 8-22 mins  Jarick Harkins MSOT, OTR/L Acute Rehab Pager: [phone] Office: [place] 05/11/2019, 2:25 PM

## 2019-05-11 NOTE — Evaluation (Signed)
Clinical/Bedside Swallow Evaluation Patient Details  Name: Troy Wells MRN: [id number] Date of Birth: February 13, 1966  Today's Date: 05/11/2019 Time: SLP Start Time (ACUTE ONLY): 1358 SLP Stop Time (ACUTE ONLY): 1419 SLP Time Calculation (min) (ACUTE ONLY): 21 min  Past Medical History:  Past Medical History:  Diagnosis Date  . CARDIAC ARREST 11/01/2009   Qualifier: Diagnosis of  By: Selena Batten CMA, [place]    . Colon polyps 01/02/2016   Colonoscopy July 2017 - One 3 mm polyp in the transverse colon, removed with a cold biopsy forceps. Resected and retrieved. - One 3 mm polyp in the rectum, removed with a cold biopsy forceps. Resected and retrieved. - Diverticulosis in the entire examined colon. - Non-bleeding internal hemorrhoids. - The examination was otherwise normal. - Significant looping which prolonged cecal    . Coronary artery disease 08/15/2009   with AMI  . Dialysis patient [facility]) 09-27-2014   mon, wed, and fri  . Diverticulosis of colon without hemorrhage 01/02/2016   Colonoscopy July 2017 - One 3 mm polyp in the transverse colon, removed with a cold biopsy forceps. Resected and retrieved. - One 3 mm polyp in the rectum, removed with a cold biopsy forceps. Resected and retrieved. - Diverticulosis in the entire examined colon. - Non-bleeding internal hemorrhoids. - The examination was otherwise normal. - Significant looping which prolonged cecal    . DM (diabetes mellitus), type 2 with renal complications ([place]) 2/94/7654  . Essential hypertension 11/01/2009   Qualifier: Diagnosis of  By: Selena Batten CMA, Jewel    . Hyperlipidemia   . Hypertension   . Mitral valve regurgitation 09/19/2015   Echo 09/2015   . OSA treated with BiPAP 06/17/2010  . Sleep apnea    cpap nightly   Past Surgical History:  Past Surgical History:  Procedure Laterality Date  . AV FISTULA PLACEMENT Left 09/27/2014  . INSERTION OF ARTERIOVENOUS (AV) ARTEGRAFT ARM Left 04/02/2018   Procedure: INSERTION OF  ARTERIOVENOUS (AV) ARTEGRAFT ARM LEFT UPPER ARM;  Surgeon: Waynetta Sandy, MD;  Location: [place];  Service: Vascular;  Laterality: Left;  . INSERTION OF DIALYSIS CATHETER N/A 04/02/2018   Procedure: INSERTION OF DIALYSIS CATHETER, right internal jugular;  Surgeon: Waynetta Sandy, MD;  Location: [place];  Service: Vascular;  Laterality: N/A;  . NECK SURGERY     C6 & C7 replaced 30 yrs ago per pt  . REVISON OF ARTERIOVENOUS FISTULA Left 04/02/2018   Procedure: REVISION PLICATION OF ARTERIOVENOUS FISTULA ARM;  Surgeon: Waynetta Sandy, MD;  Location: [place];  Service: Vascular;  Laterality: Left;  . WISDOM TOOTH EXTRACTION     HPI:  53 yo male presenting to [facility] with cardiac arrest and CPR initatied by EMS. Pt at dialysis, complained of SOB, and became unresponsive. Pt with prior hospital admission (10/27 - 11/3) for COVID associated pneumonia; active problems of acute hypoxemic respiratory failure NSTEMI during admission. Intubated 11/10-11/23. PMH including ESRD on HD, chronic anemia, chronic systolic heart failure, CAD, HTN, OSA on CPAP, DM2, and HLD.    Assessment / Plan / Recommendation Clinical Impression  Pt presents with a post-extubation dysphagia after 13 days on ventilator.  He is alert; response time is quite delayed.  Required frequent verbal prompting to encourage coughing or voicing.  Limited spontaneous output - voice is hypophonic and weak.  Pt demonstrated potential s/s of aspiration with consumption of thin liquids notable for delayed wet voice and throat-clearing, mild cough.  Pt appeared to tolerate bites of applesauce with no overt  concerns.  Given length of intubation, mentation, and clinical presentation, recommend maintaining NPO except ice chips and meds crushed in puree; SLP will f/u next date for instrumental swallow study.  D/W RN.  SLP Visit Diagnosis: Dysphagia, oropharyngeal phase (R13.12)    Aspiration Risk  Moderate aspiration risk     Diet Recommendation   npo except ice chips  Medication Administration: Crushed with puree    Other  Recommendations Oral Care Recommendations: Oral care QID   Follow up Recommendations Other (comment)(tba)      Frequency and Duration            Prognosis        Swallow Study   General Date of Onset: 04/27/19 HPI: 53 yo male presenting to [facility] with cardiac arrest and CPR initatied by EMS. Pt at dialysis, complained of SOB, and became unresponsive. Pt with prior hospital admission (10/27 - 11/3) for COVID associated pneumonia; active problems of acute hypoxemic respiratory failure NSTEMI during admission. Intubated 11/10-11/23. PMH including ESRD on HD, chronic anemia, chronic systolic heart failure, CAD, HTN, OSA on CPAP, DM2, and HLD.  Type of Study: Bedside Swallow Evaluation Previous Swallow Assessment: no Diet Prior to this Study: NPO Temperature Spikes Noted: No Respiratory Status: Nasal cannula History of Recent Intubation: Yes Length of Intubations (days): 13 days Date extubated: 05/10/19 Behavior/Cognition: Alert;Confused Oral Cavity Assessment: Within Functional Limits Oral Care Completed by SLP: Recent completion by staff Oral Cavity - Dentition: Adequate natural dentition Self-Feeding Abilities: Total assist Patient Positioning: Upright in bed Baseline Vocal Quality: Low vocal intensity;Hoarse Volitional Cough: Weak    Oral/Motor/Sensory Function Overall Oral Motor/Sensory Function: Within functional limits   Ice Chips Ice chips: Within functional limits   Thin Liquid Thin Liquid: Impaired Presentation: Spoon;Straw Pharyngeal  Phase Impairments: Multiple swallows;Throat Clearing - Delayed    Nectar Thick Nectar Thick Liquid: Not tested   Honey Thick Honey Thick Liquid: Not tested   Puree Puree: Within functional limits Presentation: Spoon   Solid     Solid: Not tested      [name] 05/11/2019,2:25 PM   [name] Ringer, [place] Office number [phone] Pager [phone]

## 2019-05-11 NOTE — Evaluation (Signed)
Physical Therapy Evaluation Patient Details Name: Troy Wells MRN: [id number] DOB: [birth year] Today's Date: 05/11/2019   History of Present Illness  53 yo male presenting to [facility] with cardiac arrest and CPR initatied by EMS. Pt at dialysis, complained of SOB, and became unresponsive. Pt with prior hospital admission (10/27 - 11/3) for COVID associated pneumonia; active problems of acute hypoxemic respiratory failure NSTEMI during admission. Intubated 11/10-11/23. PMH including ESRD on HD, chronic anemia, chronic systolic heart failure, CAD, HTN, OSA on CPAP, DM2, and HLD.   Clinical Impression  PTA pt living alone and possibly using a cane for ambulation. Pt with limited conversation, however able to follow single step commands with increased time and effort. Pt able to move all 4 extremities without assist, but is obviously weak. Evaluation limited to ROM in bed due to non-tunneled R femoral cath with CRRT. Although fatigued pt agreeable to work with therapy. PT recommending CIR level rehab at discharge. PT will continue to follow acutely.     Follow Up Recommendations CIR    Equipment Recommendations  Other (comment)(TBD at next venue)    Recommendations for Other Services Rehab consult     Precautions / Restrictions Precautions Precautions: Other (comment) Precaution Comments: non tunneled R femoral artery cath, not past 90, CRRT Restrictions Weight Bearing Restrictions: No      Mobility  Bed Mobility Overal bed mobility: Needs Assistance             General bed mobility comments: unable to attempt EoB due to femoral cath            Pertinent Vitals/Pain Pain Assessment: Faces Faces Pain Scale: Hurts even more Pain Location: sacrum with SLR of R LE Pain Descriptors / Indicators: Grimacing;Guarding;Moaning Pain Intervention(s): Limited activity within patient's tolerance;Monitored during session;Repositioned    Home Living Family/patient expects to be  discharged to:: Private residence Living Arrangements: Alone                    Prior Function Level of Independence: Independent with assistive device(s)         Comments: reports using a cane      Hand Dominance   Dominant Hand: Right    Extremity/Trunk Assessment   Upper Extremity Assessment Upper Extremity Assessment: Defer to OT evaluation    Lower Extremity Assessment Lower Extremity Assessment: RLE deficits/detail;LLE deficits/detail;Difficult to assess due to impaired cognition RLE Deficits / Details: able to initiate movement at hip, knee and anklle, obvious weakness RLE: Unable to fully assess due to immobilization(untunneled femoral calf) RLE Coordination: decreased fine motor;decreased gross motor LLE Deficits / Details: knee and ankle AAROM WFL, strength grossly 3/5, pain with hip flexion due to sacral pressure injury LLE: Unable to fully assess due to pain LLE Coordination: decreased fine motor;decreased gross motor    Cervical / Trunk Assessment Cervical / Trunk Assessment: (unable to assess)  Communication   Communication: Expressive difficulties(limited speaking, possibly due to prior intubation)  Cognition Arousal/Alertness: Awake/alert Behavior During Therapy: Flat affect Overall Cognitive Status: Difficult to assess Area of Impairment: Following commands;Attention;Problem solving                   Current Attention Level: Selective   Following Commands: Follows one step commands inconsistently;Follows one step commands with increased time     Problem Solving: Difficulty sequencing;Requires verbal cues;Requires tactile cues;Slow processing;Decreased initiation General Comments: limited amount of response to questions, improved as evaluation progressed, difficulty with initiation and sequencing of commands  General Comments General comments (skin integrity, edema, etc.): Pt on CRRT, able to maintain 100% SaO2 on RA       Exercises General Exercises - Lower Extremity Ankle Circles/Pumps: AAROM;Both;10 reps;Supine Heel Slides: Right;AAROM;10 reps;Supine Straight Leg Raises: AAROM;Right;Limitations(unable to complete due to increased sacral pain )   Assessment/Plan    PT Assessment Patient needs continued PT services  PT Problem List Decreased strength;Decreased range of motion;Decreased activity tolerance;Decreased balance;Decreased mobility;Decreased cognition;Decreased coordination;Decreased safety awareness;Pain;Decreased skin integrity       PT Treatment Interventions DME instruction;Gait training;Functional mobility training;Therapeutic activities;Therapeutic exercise;Balance training;Cognitive remediation;Patient/family education    PT Goals (Current goals can be found in the Care Plan section)  Acute Rehab PT Goals Patient Stated Goal: none stated PT Goal Formulation: With patient Time For Goal Achievement: 05/25/19 Potential to Achieve Goals: Good    Frequency Min 3X/week   Barriers to discharge Decreased caregiver support      Co-evaluation PT/OT/SLP Co-Evaluation/Treatment: Yes Reason for Co-Treatment: Necessary to address cognition/behavior during functional activity;Complexity of the patient's impairments (multi-system involvement) PT goals addressed during session: Mobility/safety with mobility         AM-PAC PT "6 Clicks" Mobility  Outcome Measure Help needed turning from your back to your side while in a flat bed without using bedrails?: Total Help needed moving from lying on your back to sitting on the side of a flat bed without using bedrails?: Total Help needed moving to and from a bed to a chair (including a wheelchair)?: Total Help needed standing up from a chair using your arms (e.g., wheelchair or bedside chair)?: Total Help needed to walk in hospital room?: Total Help needed climbing 3-5 steps with a railing? : Total 6 Click Score: 6    End of Session   Activity  Tolerance: Patient tolerated treatment well Patient left: in bed;with call bell/phone within reach;with bed alarm set;with restraints reapplied Nurse Communication: Mobility status PT Visit Diagnosis: Other abnormalities of gait and mobility (R26.89);Muscle weakness (generalized) (M62.81);Difficulty in walking, not elsewhere classified (R26.2);Pain Pain - part of body: (sacrum)    Time: 1093-2355 PT Time Calculation (min) (ACUTE ONLY): 43 min   Charges:   PT Evaluation $PT Eval High Complexity: 1 High          Chenell Lozon B. Migdalia Dk PT, DPT Acute Rehabilitation Services Pager [phone] Office [phone]   [place] 05/11/2019, 1:28 PM

## 2019-05-11 NOTE — Progress Notes (Signed)
ID PROGRESS NOTE  53yo M with hx of severe COVID in late October, who has had recent decompensation with PEA arrest at HD, and found to have respiratory distress requiring intubation on 11/10, he was found to have enterobacter pneumonia completed treatment and now extubated. He is beyond> 21day since initial diagnosis. Now on 4L [place], remains afebrile.  Can discontinue from airborne/contact isolation  Keep on standard precaution Staff should continue  with surgical mask and eye protection  Carin Shipp B. [place] [facility] Infect[facility] [phone]

## 2019-05-12 ENCOUNTER — Inpatient Hospital Stay (HOSPITAL_COMMUNITY): Payer: Medicare Other

## 2019-05-12 DIAGNOSIS — U071 COVID-19: Secondary | ICD-10-CM | POA: Diagnosis not present

## 2019-05-12 DIAGNOSIS — J80 Acute respiratory distress syndrome: Secondary | ICD-10-CM | POA: Diagnosis not present

## 2019-05-12 LAB — CBC WITH DIFFERENTIAL/PLATELET
Abs Immature Granulocytes: 1.76 10*3/uL — ABNORMAL HIGH (ref 0.00–0.07)
Basophils Absolute: 0.1 10*3/uL (ref 0.0–0.1)
Basophils Relative: 1 %
Eosinophils Absolute: 0.1 10*3/uL (ref 0.0–0.5)
Eosinophils Relative: 1 %
HCT: 27.7 % — ABNORMAL LOW (ref 39.0–52.0)
Hemoglobin: 9 g/dL — ABNORMAL LOW (ref 13.0–17.0)
Immature Granulocytes: 7 %
Lymphocytes Relative: 11 %
Lymphs Abs: 2.7 10*3/uL (ref 0.7–4.0)
MCH: 28.3 pg (ref 26.0–34.0)
MCHC: 32.5 g/dL (ref 30.0–36.0)
MCV: 87.1 fL (ref 80.0–100.0)
Monocytes Absolute: 2.5 10*3/uL — ABNORMAL HIGH (ref 0.1–1.0)
Monocytes Relative: 10 %
Neutro Abs: 18.1 10*3/uL — ABNORMAL HIGH (ref 1.7–7.7)
Neutrophils Relative %: 70 %
Platelets: 687 10*3/uL — ABNORMAL HIGH (ref 150–400)
RBC: 3.18 MIL/uL — ABNORMAL LOW (ref 4.22–5.81)
RDW: 17.8 % — ABNORMAL HIGH (ref 11.5–15.5)
WBC: 25.3 10*3/uL — ABNORMAL HIGH (ref 4.0–10.5)
nRBC: 0.2 % (ref 0.0–0.2)

## 2019-05-12 LAB — GLUCOSE, CAPILLARY
Glucose-Capillary: 119 mg/dL — ABNORMAL HIGH (ref 70–99)
Glucose-Capillary: 132 mg/dL — ABNORMAL HIGH (ref 70–99)
Glucose-Capillary: 138 mg/dL — ABNORMAL HIGH (ref 70–99)
Glucose-Capillary: 138 mg/dL — ABNORMAL HIGH (ref 70–99)
Glucose-Capillary: 139 mg/dL — ABNORMAL HIGH (ref 70–99)
Glucose-Capillary: 144 mg/dL — ABNORMAL HIGH (ref 70–99)
Glucose-Capillary: 151 mg/dL — ABNORMAL HIGH (ref 70–99)

## 2019-05-12 LAB — RENAL FUNCTION PANEL
Albumin: 2.9 g/dL — ABNORMAL LOW (ref 3.5–5.0)
Anion gap: 14 (ref 5–15)
BUN: 68 mg/dL — ABNORMAL HIGH (ref 6–20)
CO2: 20 mmol/L — ABNORMAL LOW (ref 22–32)
Calcium: 12.4 mg/dL — ABNORMAL HIGH (ref 8.9–10.3)
Chloride: 100 mmol/L (ref 98–111)
Creatinine, Ser: 5.26 mg/dL — ABNORMAL HIGH (ref 0.61–1.24)
GFR calc Af Amer: 13 mL/min — ABNORMAL LOW (ref >60)
GFR calc non Af Amer: 11 mL/min — ABNORMAL LOW (ref >60)
Glucose, Bld: 127 mg/dL — ABNORMAL HIGH (ref 70–99)
Phosphorus: 7.2 mg/dL — ABNORMAL HIGH (ref 2.5–4.6)
Potassium: 4.8 mmol/L (ref 3.5–5.1)
Sodium: 134 mmol/L — ABNORMAL LOW (ref 135–145)

## 2019-05-12 LAB — MAGNESIUM: Magnesium: 3.2 mg/dL — ABNORMAL HIGH (ref 1.7–2.4)

## 2019-05-12 LAB — POCT ACTIVATED CLOTTING TIME: Activated Clotting Time: 208 seconds

## 2019-05-12 MED ORDER — RENA-VITE PO TABS
1.0000 | ORAL_TABLET | Freq: Every day | ORAL | Status: DC
  Administered 2019-05-13 – 2019-06-15 (×33): 1 via ORAL
  Filled 2019-05-12 (×35): qty 1

## 2019-05-12 MED ORDER — NEPRO/CARBSTEADY PO LIQD
237.0000 mL | Freq: Two times a day (BID) | ORAL | Status: DC
  Administered 2019-05-12 – 2019-05-17 (×9): 237 mL via ORAL

## 2019-05-12 MED ORDER — CALCITONIN (SALMON) 200 UNIT/ML IJ SOLN
400.0000 [IU] | Freq: Two times a day (BID) | INTRAMUSCULAR | Status: AC
  Administered 2019-05-12 – 2019-05-13 (×4): 400 [IU] via SUBCUTANEOUS
  Filled 2019-05-12 (×5): qty 2

## 2019-05-12 NOTE — Progress Notes (Signed)
Modified Barium Swallow Progress Note  Patient Details  Name: Troy Wells MRN: [id number] Date of Birth: 1966/02/17  Today's Date: 05/12/2019  Modified Barium Swallow completed.  Full report located under Chart Review in the Imaging Section.  Brief recommendations include the following:  Clinical Impression  Pt presents with decreased clinical signs of dysphagia and improved performance per MBS - mild oropharyngeal dysphagia.  Demonstrates ongoing delays in processing and motor response time, with delays in oral preparation of barium.  There was consistent and reliable laryngeal vestibule closure with solids and liquids, with no penetration nor aspiration.  There was mild pyriform residue post-swallow with material resting just above UES - this did not accumulate over time.  Recommend initiating a dysphagia 3 diet with thin liquids; provide assistance with tray set-up and support for self-feeding as needed.  HOB should be elevated for all PO intake.  SLP will follow briefly for toleration/diet progression.    Swallow Evaluation Recommendations       SLP Diet Recommendations: Dysphagia 3 (Mech soft) solids;Thin liquid   Liquid Administration via: Straw;Cup   Medication Administration: Whole meds with puree   Supervision: Patient able to self feed;Staff to assist with self feeding       Postural Changes: Seated upright at 90 degrees   Oral Care Recommendations: Oral care BID      Icela Glymph L. Tivis Ringer, [place] Office number [phone] Pager [phone]   [name] 05/12/2019,2:17 PM

## 2019-05-12 NOTE — Progress Notes (Signed)
Patient going to have barium swallow study. Ticket to ride given to transport. [name]

## 2019-05-12 NOTE — Progress Notes (Addendum)
Troy Wells Progress Note  Subjective: data taken from chart and PMD,  Patient not examined today directly given COVID-19 + status, utilizing exam of the primary team and observations of RN's.   Pt on room air today, BP's okay, RR 22- 29, HR 118- 125.  K +4.8 today, B 68 and Cr 5.2.     Vitals:   05/12/19 0800 05/12/19 0900 05/12/19 1000 05/12/19 1100  BP: 125/81 137/82 134/87 (!) 143/95  Pulse: (!) 128 (!) 125 (!) 125 (!) 124  Resp: (!) 32 (!) 34 (!) 31 (!) 29  Temp: 98.7 F (37.1 C)     TempSrc: Oral     SpO2: 100% 100% 100% 100%  Weight:      Height:        Inpatient medications: . aspirin  81 mg Oral Daily  . atorvastatin  80 mg Oral Daily  . Chlorhexidine Gluconate Cloth  6 each Topical Daily  . collagenase   Topical Daily  . darbepoetin (ARANESP) injection - DIALYSIS  200 mcg Intravenous Q Tue-HD  . heparin injection (subcutaneous)  5,000 Units Subcutaneous Q8H  . insulin aspart  2-6 Units Subcutaneous Q4H  . sevelamer carbonate  0.8 g Oral TID    acetaminophen, hydrALAZINE, metoprolol tartrate    Exam:  R groin temp cath  awake, tracks, speaks minimally, alert  no jvd   Chest clear ant// lat, no rales    Cor reg  Abd firm distended slightly, nontender, dec'd bs   Ext no pitting LE or UE edema    L arm AVF +bruit   Outpt QA:STMHDQQ [facility] 4.5h F250 124.5kg 400/800 2/2.25 Hep 12,000 AVF - mircera 30 q4 last 10/13 - hect 4  Last CXR 11/23 - clear lungs   Summary: Pt is a 53 y.o. yo male with ESRD who was admitted on 04/27/2019 with resp failure due to COVID/pulm edema and cardiac arrest    Assessment/Plan:  1. Resp failure - better, on RA now, last CXR clear on 11/23 2. ESRD - normally TTS as OP via TDC. Was on CRRT from Nov 18 - Nov 24, dc'd yesterday.  Plan next HD Friday.  3. Anemia ckd- hgb dropping- s/p transfusion 11/16 and 11/19-  Increased his ESA and got 200ug darbe on 11/17 and on 11/24. Hb stable 9's.   4. Secondary  hyperparathyroidism- normally on hect 4-- has not been given here-  Calcium corrected is high > 12, will start calcitonin.  On renvela for his phos but getting TF so difficult 5. HTN/volume- marked vol reduction on CRRT x 6 days , lowered vol by 15L 6. AMS - w/ question of anoxic brain injury-  is alert today and interacts somewhat. Giving calcitonin for high Ca++ which can cause AMS.       Rob Doctor, hospital 05/12/2019, 12:18 PM  Iron/TIBC/Ferritin/ %Sat No results found for: IRON, TIBC, FERRITIN, IRONPCTSAT Recent Labs  Lab 05/12/19 0349  NA 134*  K 4.8  CL 100  CO2 20*  GLUCOSE 127*  BUN 68*  CREATININE 5.26*  CALCIUM 12.4*  PHOS 7.2*  ALBUMIN 2.9*   No results for input(s): AST, ALT, ALKPHOS, BILITOT, PROT in the last 168 hours. Recent Labs  Lab 05/12/19 0349  WBC 25.3*  HGB 9.0*  HCT 27.7*  PLT 687*

## 2019-05-12 NOTE — Progress Notes (Addendum)
FPTS Interim Progress Note  Spoke with Dr. Leonel Ramsay regarding patient's Keppra. If patient has  known seizure disorder and recommended to keep Keppra and if not to discontinue Keppra. Per patient's daughter, there is no known history of seizures. Will discontinue Keppra.     Lyndee Hensen, MD 05/12/2019, 11:56 AM PGY-1, [place] [facility] Service pager [phone]

## 2019-05-12 NOTE — Progress Notes (Signed)
Nutrition Follow-up  DOCUMENTATION CODES:   Obesity unspecified  INTERVENTION:    Nepro Shake po BID, each supplement provides 425 kcal and 19 grams protein.  Add renal MVI daily.  NUTRITION DIAGNOSIS:   Increased nutrient needs related to acute illness(COVID-19) as evidenced by estimated needs.  Ongoing.  GOAL:   Patient will meet greater than or equal to 90% of their needs   Progressing with diet advancement today.  MONITOR:   PO intake, Supplement acceptance  ASSESSMENT:   53 yo male admitted in PEA arrest after a syncopal episode at HD center. Intubated on admission. PMH includes recent COVID PNA (positive on 10/28), HTN, CAD, ESRD on HD, HLD, OSA, DM-2.   Patient was extubated 11/23. S/P MBS with SLP today. Diet advanced to dysphagia 3 with thin liquids.  Labs reviewed. Sodium 134 (L), Potassium 4.8 WNL, BUN 68 (H), creatinine 5.26 (H), phosphorus 7.2 (H), magnesium 3.2 (H) CBG's: [phone]  Medications reviewed and include novolog, renvela, calcitonin, aranesp.  I/O -13.6 L since admission Weight on admission 118.6 kg, current weight 100.4 kg  Diet Order:   Diet Order            DIET DYS 3 Room service appropriate? Yes with Assist; Fluid consistency: Thin  Diet effective now              EDUCATION NEEDS:   Not appropriate for education at this time  Skin:  Skin Assessment: Skin Integrity Issues: Skin Integrity Issues:: Stage II Stage II: sacrum  Last BM:  11/25  Height:   Ht Readings from Last 1 Encounters:  05/01/19 5\' 10"  (1.778 m)    Weight:   Wt Readings from Last 1 Encounters:  05/12/19 100.4 kg    Ideal Body Weight:  75.5 kg  BMI:  Body mass index is 31.76 kg/m.  Estimated Nutritional Needs:   Kcal:  1103-1594  Protein:  120-140 gm  Fluid:  1 L + UOP    Molli Barrows, RD, LDN, [place] Pager [phone] After Hours Pager [phone]

## 2019-05-12 NOTE — Progress Notes (Deleted)
FPTS Interim Progress Note  Spoke with Dr. Leonel Ramsay regarding patient's Keppra. If patient has  known seizure disorder and recommended to keep Keppra and if not to discontinue Keppra. Per patient's daughter, there is no known history of seizures. Will discontinue Keppra.    Lyndee Hensen, MD 05/12/2019, 12:08 PM PGY-1, [place] [facility] Service pager [phone]

## 2019-05-12 NOTE — Progress Notes (Signed)
Patient able to travel without nurse per Dr. Kris Mouton. [name]

## 2019-05-12 NOTE — Progress Notes (Addendum)
Family Medicine Teaching Service Daily Progress Note Intern Pager: [phone]  Patient name: Troy Wells Medical record number: [id number] Date of birth: 04-Dec-1965 Age: 53 y.o. Gender: male  Primary Care Provider: [name], MD Consultants: Neurology, Critical Care, Nephrology  Code Status: Full  Pt Overview and Major Events to Date:  05/10/19: Extubated 05/12/19: FPTS took over care   Assessment and Plan:  Troy Wells is a 53 y.o. male transferred from ICU after cardiac arrest. PMH is significant for ESRD MWF HD, OSA, HTN, HLD, CHFpEF, pHTN, DM, diabetic neuropathy.     Acute metabolic encephalopathic  Anoxic brain injury?  PEA cardiac arrest Patient continues to be tachycardic and tachypneic.  Limited in verbal responses.  Able to answer nurse and her questions.  Patient went into PEA cardiac arrest during hemodialysis for 12 minutes.  MRI brain obtained on 05/02/2019 suspicious for anoxic brain injury.  Per neurology, MRI brain suspicious of cortical injury as well.  Etiology of patient's symptoms is likely due to diffuse anoxic brain injury.  Patient was evaluated on 05/09/2019 by neurology was found to be in a minimally conscious state.  Per patient's daughter, patient with no history of seizure disorder.  Speaking with Dr. Leonel Wells, neurologist, discontinue Keppra.  Patient to have swallow study with SLP today.  Leukocytosis evident on today's labs (WBC 25.3).  Unsure if this is stress response due to extubation however will continue to monitor.  Patient's calcium 12.4 today which could potentially be a cause of patient's encephalopathy.  Nephrology will give calcitonin. - Neurology consulted by CCM, following peripherally - Discontinued Keppra 500 mg daily  - Per SLP, NPO except ice chips, meds crushed with pure - PT/OT: CIR recommended - AM CBC / RPF    COVID  Enterobacter HAP/CAP Patient transferred to our service from the ICU.  Patient diagnosed with  Covid in late October and went into PEA arrest during hemodialysis.  Patient has been extubated for greater than 24 hours and responsive to commands.  Per ID, airborne contact precautions have been removed as patient has been more than 20 days since Covid diagnosis.  Continue with standard precautions.  Patient was treated with CTX for 7 days. -Pulmonary critical care signed off  ESRD on HD  Hypercalcemia  Secondary hyperparathyroidism Hemodialysis scheduled for nephrology.  Patient received continuous renal replacement therapy. Next session likely today or Friday.  Outpatient schedule is Tues / Thurs /  Sat.  Additionally, calcium 12.4 has been progressively uptrending.  Nephrology will start calcitonin.   -Nephrology following, appreciate recommendations -Follow-up renal function panel  HFmEF   CAD ECHO on 04/27/19 40 to 45%.  LV with mild to moderate decreased function, mildly increased LVH, inferiolateral akinesis and anterolateral severe hypokinesis.  Grade 2 diastolic dysfunction.  Patient with net I&O of -1.3 L since admission.  Home medications include Coreg 25 mg daily,  aspirin 81 mg -Continue home aspirin -Nephrology following for HD  Hypertension Blood pressure more tensive today. Home medications include minoxidil 20 mg BID, amlodipine 10 mg, Coreg 25 mg BID -Hold Coreg and minoxidil   OSA with use of CPAP  As patient tested Covid negative, offer CPAP at bedtime.  HLD Home medications include atorvastatin 40mg  -Continue atorvastatin   Diabetes Fasting blood sugar 119.  Patient received units sliding scale insulin.  Home medications include glipizide 5 mg twice daily.    Last A1c 5.8 on 04/14/2019. -Sliding scale insulin  -Monitor CBGs   Diabetic Neuropathy  Patient on home gabapentin  300 mg TID. Patient reports only taking twice daily.  -Hold gabapentin 300mg  TID     Gout  Home medications include allopurinol 100mg  BID and colchicine 0.6mg  BID as needed  when symptomatic.     FEN/GI: Replete electrolytes as needed, NPO except with meds PPx: Heparin  Disposition: Likely rehab facility, pending medical clearance   Subjective:  Troy Wells had no significant overnight events.   Objective: Temp:  [96.6 F (35.9 C)-99.2 F (37.3 C)] 99.1 F (37.3 C) (11/25 0407) Pulse Rate:  [94-130] 116 (11/25 0600) Resp:  [14-37] 26 (11/25 0600) BP: (90-159)/(61-117) 122/77 (11/25 0600) SpO2:  [86 %-100 %] 100 % (11/25 0600) Weight:  [100.4 kg] 100.4 kg (11/25 0354)  Physical Exam: General: Alert, in no acute distress, Cardiovascular: Regular rate and rhythm, distal pulses intact Respiratory: Rhonchorous diffusely, no increased work of breathing Abdomen: Soft, nontender, nondistended, no palpable masses Extremities: No appreciable lower extremity edema Neurological: Limited response to verbal commands, knows name, year and place.  Gross sensation intact, strength 3 out of 5 upper and lower extremities   Laboratory: Recent Labs  Lab 05/09/19 0255  05/10/19 0422  05/10/19 1747 05/11/19 0004 05/12/19 0349  WBC 12.6*  --  14.5*  --   --   --  25.3*  HGB 9.1*   < > 8.9*   < > 9.6* 9.7* 9.0*  HCT 28.7*   < > 27.3*   < > 29.2* 29.5* 27.7*  PLT 492*  --  567*  --   --   --  687*   < > = values in this interval not displayed.   Recent Labs  Lab 05/10/19 1555 05/11/19 0445 05/12/19 0349  NA 135 137 134*  K 4.3 4.1 4.8  CL 98 97* 100  CO2 24 26 20*  BUN 35* 32* 68*  CREATININE 2.61* 2.53* 5.26*  CALCIUM 11.7* 12.0* 12.4*  GLUCOSE 137* 132* 127*      Imaging/Diagnostic Tests: Dg Swallowing Func-speech Pathology:  Recommend initiating a dysphagia 3 diet with thin liquids; provide assistance with tray set-up and support for self-feeding as needed.  HOB should be elevated for all PO intake.  SLP will follow briefly for toleration/diet progression.   Troy Hensen, MD 05/12/2019, 7:39 AM PGY-1, [place] Intern pager: [phone], text pages welcome

## 2019-05-13 ENCOUNTER — Inpatient Hospital Stay (HOSPITAL_COMMUNITY): Payer: Medicare Other

## 2019-05-13 DIAGNOSIS — N186 End stage renal disease: Secondary | ICD-10-CM | POA: Diagnosis not present

## 2019-05-13 DIAGNOSIS — D72825 Bandemia: Secondary | ICD-10-CM | POA: Diagnosis not present

## 2019-05-13 DIAGNOSIS — J156 Pneumonia due to other aerobic Gram-negative bacteria: Secondary | ICD-10-CM

## 2019-05-13 DIAGNOSIS — G931 Anoxic brain damage, not elsewhere classified: Secondary | ICD-10-CM | POA: Diagnosis not present

## 2019-05-13 DIAGNOSIS — E875 Hyperkalemia: Secondary | ICD-10-CM | POA: Diagnosis not present

## 2019-05-13 LAB — BASIC METABOLIC PANEL
Anion gap: 23 — ABNORMAL HIGH (ref 5–15)
BUN: 141 mg/dL — ABNORMAL HIGH (ref 6–20)
CO2: 13 mmol/L — ABNORMAL LOW (ref 22–32)
Calcium: 10.5 mg/dL — ABNORMAL HIGH (ref 8.9–10.3)
Chloride: 99 mmol/L (ref 98–111)
Creatinine, Ser: 11.28 mg/dL — ABNORMAL HIGH (ref 0.61–1.24)
GFR calc Af Amer: 5 mL/min — ABNORMAL LOW (ref >60)
GFR calc non Af Amer: 5 mL/min — ABNORMAL LOW (ref >60)
Glucose, Bld: 201 mg/dL — ABNORMAL HIGH (ref 70–99)
Potassium: 6 mmol/L — ABNORMAL HIGH (ref 3.5–5.1)
Sodium: 135 mmol/L (ref 135–145)

## 2019-05-13 LAB — COMPREHENSIVE METABOLIC PANEL
ALT: 44 U/L (ref 0–44)
AST: 19 U/L (ref 15–41)
Albumin: 3.1 g/dL — ABNORMAL LOW (ref 3.5–5.0)
Alkaline Phosphatase: 98 U/L (ref 38–126)
Anion gap: 21 — ABNORMAL HIGH (ref 5–15)
BUN: 121 mg/dL — ABNORMAL HIGH (ref 6–20)
CO2: 15 mmol/L — ABNORMAL LOW (ref 22–32)
Calcium: 10.8 mg/dL — ABNORMAL HIGH (ref 8.9–10.3)
Chloride: 100 mmol/L (ref 98–111)
Creatinine, Ser: 9.99 mg/dL — ABNORMAL HIGH (ref 0.61–1.24)
GFR calc Af Amer: 6 mL/min — ABNORMAL LOW (ref >60)
GFR calc non Af Amer: 5 mL/min — ABNORMAL LOW (ref >60)
Glucose, Bld: 132 mg/dL — ABNORMAL HIGH (ref 70–99)
Potassium: 6.1 mmol/L — ABNORMAL HIGH (ref 3.5–5.1)
Sodium: 136 mmol/L (ref 135–145)
Total Bilirubin: 0.8 mg/dL (ref 0.3–1.2)
Total Protein: 8.3 g/dL — ABNORMAL HIGH (ref 6.5–8.1)

## 2019-05-13 LAB — CBC
HCT: 31.4 % — ABNORMAL LOW (ref 39.0–52.0)
Hemoglobin: 10.3 g/dL — ABNORMAL LOW (ref 13.0–17.0)
MCH: 28.2 pg (ref 26.0–34.0)
MCHC: 32.8 g/dL (ref 30.0–36.0)
MCV: 86 fL (ref 80.0–100.0)
Platelets: 672 10*3/uL — ABNORMAL HIGH (ref 150–400)
RBC: 3.65 MIL/uL — ABNORMAL LOW (ref 4.22–5.81)
RDW: 17.9 % — ABNORMAL HIGH (ref 11.5–15.5)
WBC: 26.8 10*3/uL — ABNORMAL HIGH (ref 4.0–10.5)
nRBC: 0.3 % — ABNORMAL HIGH (ref 0.0–0.2)

## 2019-05-13 LAB — GLUCOSE, CAPILLARY
Glucose-Capillary: 120 mg/dL — ABNORMAL HIGH (ref 70–99)
Glucose-Capillary: 119 mg/dL — ABNORMAL HIGH (ref 70–99)
Glucose-Capillary: 194 mg/dL — ABNORMAL HIGH (ref 70–99)
Glucose-Capillary: 191 mg/dL — ABNORMAL HIGH (ref 70–99)
Glucose-Capillary: 183 mg/dL — ABNORMAL HIGH (ref 70–99)
Glucose-Capillary: 146 mg/dL — ABNORMAL HIGH (ref 70–99)

## 2019-05-13 MED ORDER — SODIUM ZIRCONIUM CYCLOSILICATE 10 G PO PACK
10.0000 g | PACK | Freq: Three times a day (TID) | ORAL | Status: DC
  Administered 2019-05-13 – 2019-05-17 (×11): 10 g via ORAL
  Filled 2019-05-13 (×13): qty 1

## 2019-05-13 MED ORDER — CHLORHEXIDINE GLUCONATE CLOTH 2 % EX PADS: 6.0000 | MEDICATED_PAD | Freq: Every day | CUTANEOUS | Status: DC

## 2019-05-13 MED ORDER — INSULIN ASPART 100 UNIT/ML ~~LOC~~ SOLN
0.0000 [IU] | Freq: Three times a day (TID) | SUBCUTANEOUS | Status: DC
  Administered 2019-05-13 – 2019-05-19 (×7): 2 [IU] via SUBCUTANEOUS

## 2019-05-13 MED ORDER — ALLOPURINOL 100 MG PO TABS
100.0000 mg | ORAL_TABLET | Freq: Every day | ORAL | Status: DC
  Administered 2019-05-13 – 2019-06-16 (×34): 100 mg via ORAL
  Filled 2019-05-13 (×34): qty 1

## 2019-05-13 NOTE — Progress Notes (Signed)
Family Medicine Teaching Service Daily Progress Note Intern Pager: [phone]  Patient name: Troy Wells Medical record number: [id number] Date of birth: 01-Jan-1966 Age: 53 y.o. Gender: male  Primary Care Provider: [name], MD Consultants: Neurology, critical care, nephrology Code Status: Full  Pt Overview and Major Events to Date:  04/27/2019 admitted to ICU 05/02/2019 MRI brain showing anoxic brain injury 05/10/2019: Extubated 05/12/2019: Started receiving care under [facility]  Assessment and Plan: Troy Wells is a 53 year old male transferred from ICU after cardiac arrest with recent history of COVID-19 infection.  PMH significant for ESRD MWF on HD, OSA, HTN, HLD, HFpEF, pHTN, type 2 diabetes, and diabetic neuropathy.  Acute metabolic encephalopathic  Anoxic brain injury?  PEA cardiac arrest Patient continues to be tachycardic to 100, tachypnea has improved, satting 100% on RA. Limited and slow with verbal responses. WBC slightly worse today WBC 25.3 > 26.8.  Unsure if leukocytosis due to extubation vs new onset infection starting (lungs, blood, fem line?). - Neurology consulted - to see today 11/26 - Per SLP - DYS3 Diet - PT/OT: CIR recommended - AM CBC / RFP - Tylenol 650mg  q4 PRN Temp, pain - Order CXR, REMOVE FEMORAL LINE  ESRD on HD  Hypercalcemia  Secondary hyperparathyroidism HD scheduled w/ nephrology. Outpatient schedule is TTS.  Calcium 12.4 > 10.8 tody 11/26 after receiving Calcitonin. -Calcitonin 400units SubQ BID x 4 doses -Nephrology following, appreciate recommendations -Follow-up renal function panel -Next HD Friday 11/27 -Aranesp qTuesday w/ HD, dose increased due to low Hgb  Hyperkalemia: pt with K 6.1 today 11/26. -Due for HD 11/27 -Lokelma 10g  -Repeat BMP in the PM 11/26  COVID  Enterobacter HAP/CAP, resolved Patient transferred to our service from the ICU. Patient diagnosed w/ Covid in late October and went into PEA arrest during  HD. Patient has been more than 20 days since Covid diagnosis. Continue with standard precautions. Patient treated w/ CTX for 7 days. -Pulmonary critical care signed off  HFmEF  CAD ECHO on 04/27/19 40 to 45%.  LV with mild to moderate decreased fxn, mildly increased LVH, inferiolateral akinesis and anterolateral severe hypokinesis. Grade 2 diastolic dysfunction.  I&Os net 12L out since admission. Home meds include Coreg 25 mg daily,aspirin 81 mg -Continue home ASA -Nephrology following for HD  Hypertension BP 123/82 on 11/26.  Home meds include minoxidil 20mg  BID, amlodipine 10 mg, Coreg 25 mg BID. -Hold Coreg and minoxidil  -Continue Amlodipine 10mg   OSA with use of CPAP  Patient tested negative for Covid.  - CPAP at bedtime.  HLD Home medications include atorvastatin 40mg  -Atorvastatin 80mg  daily  Diabetes Home medications include glipizide 5 mg BID. Last A1c 5.8 on 04/14/2019. -sSSI -Monitor CBGs  Diabetic Neuropathy  Patient on home gabapentin 300 mg TID. Patient reports only taking BID.  -Hold gabapentin 300mg  TID    Gout  Home medications include allopurinol 100mg  BID and colchicine 0.6mg  BID as needed when symptomatic.   FEN/GI: Replete electrolytes as needed, DYS3 Diet PPx: Heparin  Disposition: CAN BE TRANSFERRED TO STEP DOWN BED! No longer requires tx in the ICU. Post-hospitalization dispo: Most likely Rehab facility, pending medical clearance  Subjective:  Patient resting in bed, slow to respond. Speaking with one word answers.  Objective: Temp:  [98 F (36.7 C)-100.3 F (37.9 C)] 99.1 F (37.3 C) (11/26 1131) Pulse Rate:  [92-124] 124 (11/26 1000) Resp:  [17-26] 26 (11/26 1000) BP: (117-147)/(75-103) 142/87 (11/26 1000) SpO2:  [100 %] 100 % (11/26 1000) Weight:  [  98.5 kg] 98.5 kg (11/26 0347) Physical Exam: General: NAD Cardiovascular: RRR S1S2 present Respiratory: Normal work of breathing on RA Abdomen: Soft, bowel sounds  normal Extremities: No edema or deformity appreciated  Laboratory: Recent Labs  Lab 05/10/19 0422  05/11/19 0004 05/12/19 0349 05/13/19 0618  WBC 14.5*  --   --  25.3* 26.8*  HGB 8.9*   < > 9.7* 9.0* 10.3*  HCT 27.3*   < > 29.5* 27.7* 31.4*  PLT 567*  --   --  687* 672*   < > = values in this interval not displayed.   Recent Labs  Lab 05/11/19 0445 05/12/19 0349 05/13/19 0618  NA 137 134* 136  K 4.1 4.8 6.1*  CL 97* 100 100  CO2 26 20* 15*  BUN 32* 68* 121*  CREATININE 2.53* 5.26* 9.99*  CALCIUM 12.0* 12.4* 10.8*  PROT  --   --  8.3*  BILITOT  --   --  0.8  ALKPHOS  --   --  98  ALT  --   --  44  AST  --   --  19  GLUCOSE 132* 127* 132*    Ca 12.4 > 10.8 11/26  Imaging/Diagnostic Tests: CXR 11/26: Subtle interstitial prominence at the right lung base could represent mild pneumonitis. No signs of dense consolidation or pleural effusion. Subtle added density at right lung apex may represent an area of resolving airspace process from previous infection, attention on follow-up. Stable cardiomegaly with signs of atherosclerosis.   Daisy Floro, DO 05/13/2019, 12:10 PM PGY-2, [facility] Intern pager: [phone], text pages welcome

## 2019-05-13 NOTE — Progress Notes (Signed)
Assisted tele visit to patient with daughter. Correct room number, name, and phone number verified with bedside RN.  Babita Amaker, Janace Hoard, RN

## 2019-05-13 NOTE — Progress Notes (Signed)
Subjective: Slowed responses, but awake.   Exam: Vitals:   05/13/19 1000 05/13/19 1131  BP: (!) 142/87   Pulse: (!) 124   Resp: (!) 26   Temp:  99.1 F (37.3 C)  SpO2: 100%    Gen: In bed, NAD Resp: non-labored breathing, no acute distress Abd: soft, nt  Neuro: MS: Awake, able to tell me his name, does not give the month. He follows simple commands and answers simple questions with some delay.  CN: PERRL, responds to visual stimuli in both hemifields, EOMI Motor: able to follow commands in all 4 ext, able to lift both arms against gravity.  Sensory: endorses sensation bilaterally LSL:[id number]  Impression: 53 yo M with anoxic brain injury following PEA arrest on 11/10. His MRI revealed some bilateral thalamic change. He is making progress with recovery. With his current exam, I think that he has a significant chance at continued gradual recovery. This will be slow and is not definite, but certainly with his current exam this soon after the injury, I would favor continued support. Ammonia was elevated on the single time it was checked early in this hospitalization, may be reasonable to check again.   Recommendations: 1) Continue supportive care.  2) Ammonia, consider treating if still elevated.  3) Neurology is available as needed.   Roland Rack, MD [facility] [phone]  If 7pm- 7am, please page neurology on call as listed in [place].

## 2019-05-13 NOTE — Progress Notes (Addendum)
Wound noted on sacral area. Previously noted as stage 2. Wound consult previously for Lt shoulder wound. Will consult for sacrum.

## 2019-05-13 NOTE — Progress Notes (Signed)
[facility] Progress Note  Subjective: data taken from chart and PMD,  Patient not examined today directly given COVID-19 + status, utilizing exam of the primary team and observations of RN's.    Vitals:   05/13/19 0800 05/13/19 0900 05/13/19 1000 05/13/19 1131  BP: 130/83 125/85 (!) 142/87   Pulse: (!) 110 (!) 114 (!) 124   Resp: (!) 21 (!) 22 (!) 26   Temp:    99.1 F (37.3 C)  TempSrc:    Axillary  SpO2: 100% 100% 100%   Weight:      Height:        Inpatient medications: . allopurinol  100 mg Oral Daily  . aspirin  81 mg Oral Daily  . atorvastatin  80 mg Oral Daily  . calcitonin  400 Units Subcutaneous BID  . Chlorhexidine Gluconate Cloth  6 each Topical Daily  . collagenase   Topical Daily  . darbepoetin (ARANESP) injection - DIALYSIS  200 mcg Intravenous Q Tue-HD  . feeding supplement (NEPRO CARB STEADY)  237 mL Oral BID BM  . heparin injection (subcutaneous)  5,000 Units Subcutaneous Q8H  . insulin aspart  0-9 Units Subcutaneous TID WC  . multivitamin  1 tablet Oral QHS  . sevelamer carbonate  0.8 g Oral TID  . sodium zirconium cyclosilicate  10 g Oral TID    acetaminophen, metoprolol tartrate    Exam:  Patient not examined today directly given COVID-19 + status, utilizing exam of the primary team and observations of RN's.    Outpt HD:TTS- [facility] 4.5h F250 124.5kg 400/800 2/2.25 Hep 12,000 AVF - mircera 30 q4 last 10/13 - hect 4   Summary: Pt is a 53 y.o. yo male with ESRD who was admitted on 04/27/2019 with resp failure due to COVID/pulm edema and cardiac arrest    Assessment/Plan:  1. Resp failure - resolved w/ marked vol removal per CRRT, extubated and on RA 2. ESRD - normally TTS as OP via TDC. Had CRRT from 11/18- 11/24. Next HD tomorrow. Still seems catabolic w/ rapidly rising BUN/ K+ off HD.  Plan HD upstairs tomorrow (COVID precautions are now dc'd per ID). Please transfer out of ICU if possible.  3. Anemia- hgb  dropping- s/p transfusion 11/16 and 11/19-  Increased his ESA- cont supportive care 4. MBD ckd - Ca++ high, see below. No vit D here, phos 7.2 cont binder 5. HTN/volume- marked vol reduction , down 20kg from admission 6. AMS - better, neuro following 7. Hypercalcemia - most likely this is immobility related. Ca++> 12, started calcitonin SQ x 48 hrs on 11/25. Ca++ down today. Use low Ca bath. Not getting any vit D here. May need bisphosphonate if recurs. -      Rob Alano Blasco 05/13/2019, 12:19 PM  Iron/TIBC/Ferritin/ %Sat No results found for: IRON, TIBC, FERRITIN, IRONPCTSAT Recent Labs  Lab 05/12/19 0349 05/13/19 0618  NA 134* 136  K 4.8 6.1*  CL 100 100  CO2 20* 15*  GLUCOSE 127* 132*  BUN 68* 121*  CREATININE 5.26* 9.99*  CALCIUM 12.4* 10.8*  PHOS 7.2*  --   ALBUMIN 2.9* 3.1*   Recent Labs  Lab 05/13/19 0618  AST 19  ALT 44  ALKPHOS 98  BILITOT 0.8  PROT 8.3*   Recent Labs  Lab 05/13/19 0618  WBC 26.8*  HGB 10.3*  HCT 31.4*  PLT 672*

## 2019-05-14 DIAGNOSIS — D72825 Bandemia: Secondary | ICD-10-CM | POA: Diagnosis not present

## 2019-05-14 DIAGNOSIS — L89309 Pressure ulcer of unspecified buttock, unspecified stage: Secondary | ICD-10-CM | POA: Diagnosis not present

## 2019-05-14 DIAGNOSIS — N186 End stage renal disease: Secondary | ICD-10-CM | POA: Diagnosis not present

## 2019-05-14 DIAGNOSIS — R000 Tachycardia, unspecified: Secondary | ICD-10-CM

## 2019-05-14 DIAGNOSIS — J9601 Acute respiratory failure with hypoxia: Secondary | ICD-10-CM | POA: Diagnosis not present

## 2019-05-14 LAB — CBC
HCT: 29.9 % — ABNORMAL LOW (ref 39.0–52.0)
Hemoglobin: 10.1 g/dL — ABNORMAL LOW (ref 13.0–17.0)
MCH: 28.5 pg (ref 26.0–34.0)
MCHC: 33.8 g/dL (ref 30.0–36.0)
MCV: 84.5 fL (ref 80.0–100.0)
Platelets: 694 10*3/uL — ABNORMAL HIGH (ref 150–400)
RBC: 3.54 MIL/uL — ABNORMAL LOW (ref 4.22–5.81)
RDW: 17.9 % — ABNORMAL HIGH (ref 11.5–15.5)
WBC: 24 10*3/uL — ABNORMAL HIGH (ref 4.0–10.5)
nRBC: 0.5 % — ABNORMAL HIGH (ref 0.0–0.2)

## 2019-05-14 LAB — RENAL FUNCTION PANEL
Albumin: 3.1 g/dL — ABNORMAL LOW (ref 3.5–5.0)
Anion gap: 28 — ABNORMAL HIGH (ref 5–15)
BUN: 167 mg/dL — ABNORMAL HIGH (ref 6–20)
CO2: 14 mmol/L — ABNORMAL LOW (ref 22–32)
Calcium: 11.1 mg/dL — ABNORMAL HIGH (ref 8.9–10.3)
Chloride: 96 mmol/L — ABNORMAL LOW (ref 98–111)
Creatinine, Ser: 12.73 mg/dL — ABNORMAL HIGH (ref 0.61–1.24)
GFR calc Af Amer: 5 mL/min — ABNORMAL LOW (ref >60)
GFR calc non Af Amer: 4 mL/min — ABNORMAL LOW (ref >60)
Glucose, Bld: 141 mg/dL — ABNORMAL HIGH (ref 70–99)
Phosphorus: >30 mg/dL — ABNORMAL HIGH (ref 2.5–4.6)
Potassium: 6.3 mmol/L (ref 3.5–5.1)
Sodium: 138 mmol/L (ref 135–145)

## 2019-05-14 LAB — GLUCOSE, CAPILLARY
Glucose-Capillary: 160 mg/dL — ABNORMAL HIGH (ref 70–99)
Glucose-Capillary: 113 mg/dL — ABNORMAL HIGH (ref 70–99)
Glucose-Capillary: 129 mg/dL — ABNORMAL HIGH (ref 70–99)
Glucose-Capillary: 106 mg/dL — ABNORMAL HIGH (ref 70–99)

## 2019-05-14 MED ORDER — TORSEMIDE 20 MG PO TABS
100.0000 mg | ORAL_TABLET | ORAL | Status: DC
  Administered 2019-05-15 – 2019-05-16 (×2): 100 mg via ORAL
  Filled 2019-05-14: qty 5

## 2019-05-14 MED ORDER — HEPARIN SODIUM (PORCINE) 1000 UNIT/ML DIALYSIS: 8000.0000 [IU] | Freq: Once | INTRAMUSCULAR | Status: DC

## 2019-05-14 MED ORDER — CHLORHEXIDINE GLUCONATE CLOTH 2 % EX PADS
6.0000 | MEDICATED_PAD | Freq: Every day | CUTANEOUS | Status: DC
  Administered 2019-05-15 – 2019-05-17 (×3): 6 via TOPICAL

## 2019-05-14 MED ORDER — HEPARIN SODIUM (PORCINE) 1000 UNIT/ML IJ SOLN
INTRAMUSCULAR | Status: AC
  Filled 2019-05-14: qty 1

## 2019-05-14 MED ORDER — CARVEDILOL 25 MG PO TABS
25.0000 mg | ORAL_TABLET | Freq: Two times a day (BID) | ORAL | Status: DC
  Administered 2019-05-15 – 2019-05-19 (×5): 25 mg via ORAL
  Filled 2019-05-14 (×5): qty 1

## 2019-05-14 MED ORDER — COLLAGENASE 250 UNIT/GM EX OINT
TOPICAL_OINTMENT | Freq: Every day | CUTANEOUS | Status: DC
  Administered 2019-05-15 – 2019-05-31 (×14): via TOPICAL
  Filled 2019-05-14 (×3): qty 30

## 2019-05-14 NOTE — Progress Notes (Signed)
Spoke with wound care who said there was no evidence of infection to sacral wound; however Sacral wound is covered with yellow slough. Santyl was applied. If patient's labs do not improve might get surgery involved for evaluation/debridement.  Milus Banister, [place], PGY-2 05/14/2019 3:03 PM

## 2019-05-14 NOTE — Progress Notes (Signed)
[facility] Progress Note  Subjective: pt seen on HD, early BP drops on HD w/ low uf goal    Vitals:   05/14/19 1300 05/14/19 1330 05/14/19 1400 05/14/19 1430  BP: (!) 141/70 (!) 104/43 (!) 82/41 101/60  Pulse: (!) 109 82 (!) 129 (!) 112  Resp:      Temp:      TempSrc:      SpO2:      Weight:      Height:        Inpatient medications: . allopurinol  100 mg Oral Daily  . aspirin  81 mg Oral Daily  . atorvastatin  80 mg Oral Daily  . carvedilol  25 mg Oral BID WC  . Chlorhexidine Gluconate Cloth  6 each Topical Q0600  . collagenase   Topical Daily  . darbepoetin (ARANESP) injection - DIALYSIS  200 mcg Intravenous Q Tue-HD  . feeding supplement (NEPRO CARB STEADY)  237 mL Oral BID BM  . heparin      . heparin injection (subcutaneous)  5,000 Units Subcutaneous Q8H  . insulin aspart  0-9 Units Subcutaneous TID WC  . multivitamin  1 tablet Oral QHS  . sevelamer carbonate  0.8 g Oral TID  . sodium zirconium cyclosilicate  10 g Oral TID  . [START ON 05/15/2019] torsemide  100 mg Oral Once per day on Sun Tue Thu Sat    acetaminophen    Exam:  awake and alert, not verbalizing much   No jvd   Chest cta bilat    Cor reg    Abd soft ntnd     Ext mild edema    Neuro alert, no oriented though    AVF +bruit   Outpt HD:TTS- [facility] 4.5h F250 124.5kg 400/800 2/2.25 Hep 12,000 AVF - mircera 30 q4 last 10/13 - hect 4   Summary: Pt is a 53 y.o. yo male with ESRD who was admitted on 04/27/2019 with resp failure due to COVID/pulm edema and cardiac arrest    Assessment/Plan:  1. Resp failure - resolved w/ marked vol removal per CRRT, extubated on RA 2. ESRD - normally TTS via TDC. SP CRRT 11/18- 11/24. HD today [facility]. 3. Anemia- hgb dropping- s/p transfusion 11/16 and 11/19-  Increased his ESA- cont supportive care 4. MBD ckd - Ca++ high, see below. No vit D here, phos 7.2 cont binder 5. HTN/volume- marked vol reduction , down 20kg from  admission 6. AMS - better, neuro following 7. Hypercalcemia - most likely this is immobility related. Ca++> 12, started calcitonin SQ x 48 hrs on 11/25. Ca++ down. Use low Ca bath. Not getting any vit D here. May need bisphosphonate if recurs.   Rob Doctor, hospital 05/14/2019, 3:30 PM  Iron/TIBC/Ferritin/ %Sat No results found for: IRON, TIBC, FERRITIN, IRONPCTSAT Recent Labs  Lab 05/14/19 0529  NA 138  K 6.3*  CL 96*  CO2 14*  GLUCOSE 141*  BUN 167*  CREATININE 12.73*  CALCIUM 11.1*  PHOS >30.0*  ALBUMIN 3.1*   Recent Labs  Lab 05/13/19 0618  AST 19  ALT 44  ALKPHOS 98  BILITOT 0.8  PROT 8.3*   Recent Labs  Lab 05/14/19 0529  WBC 24.0*  HGB 10.1*  HCT 29.9*  PLT 694*

## 2019-05-14 NOTE — Progress Notes (Addendum)
Family Medicine Teaching Service Daily Progress Note Intern Pager: [phone]  Patient name: Troy Wells Medical record number: [id number] Date of birth: 02/17/1966 Age: 53 y.o. Gender: male  Primary Care Provider: [name], MD Consultants: Neurology, critical care, nephrology Code Status: Full  Pt Overview and Major Events to Date:  04/27/2019 admitted to ICU 05/02/2019 MRI brain showing anoxic brain injury 05/10/2019: Extubated 05/12/2019: Started receiving care under FPTS  Assessment and Plan: Troy Wells is a 53 y.o. male transferred from ICU after cardiac arrest with recent history of COVID-19 infection.  PMH significant for ESRD MWF on HD, OSA, HTN, HLD, HFpEF, pHTN, type 2 diabetes, and diabetic neuropathy.  Acute metabolic encephalopathic  Anoxic brain injury  PEA cardiac arrest Patient responsive to verbal commands however continues to have minimal verbal response.  Per neurology, patient's recovery will be slow but good prognosis is hopeful. - Neurology consulted, appreciate recommendations - Per SLP - DYS3 Diet - PT/OT: To reassess - AM CBC / RFP - Tylenol 650mg  q4 PRN Temp, pain   COVID (resolved)  Enterobacter HAP  Patient persistently tachycardic and has leukocytosis (WBC 24 today from 26.8 yesterday).  Femoral line was removed.  Chest x-ray indicated septal interstitial prominence at the right lung base that could represent early mild pneumonitis however no dense consolidation or pleural effusions were seen.  Patient was treated with ceftriaxone for Enterobacter pneumonia however suspect inducible resistance.  Consult ID and consider cefepime.  -Pulmonary critical care signed off -Consult infectious disease, appreciate recommendations -s/p CTX (11/20 - 11/24)    ESRD on HD  Hypercalcemia  Secondary hyperparathyroidism HD scheduled w/ nephrology today. Outpatient schedule is TTS.  Will defer hyperglycemia management to nephrology.  Hyperkalemic  6.8 today.  Patient to have HD today. -Calcitonin 400units SubQ BID x 4 doses -Nephrology following, appreciate recommendations -Follow-up renal function panel -Next HD Friday 11/27 -Aranesp qTuesday w/ HD, dose increased due to low Hgb   HFmEF  CAD ECHO on 04/27/19 40 to 45%.  LV with mild to moderate decreased fxn, mildly increased LVH, inferiolateral akinesis and anterolateral severe hypokinesis. Grade 2 diastolic dysfunction.  I&Os net ~12L out since admission. Home meds include Coreg 25 mg daily,aspirin 81 mg. -Continue home ASA -Nephrology following for HD -Restart Coreg after HD  Hypertension BP 140/88 today. Home meds include minoxidil 20mg  BID, amlodipine 10 mg, Coreg 25 mg BID. -Hold minoxidil  -Continue Amlodipine 10mg  -Restart Coreg after HD  OSA with use of CPAP  Patient tested negative for Covid.  -Offer CPAP at bedtime.  HLD Home medications include atorvastatin 40mg  -Atorvastatin 80mg  daily  Diabetes Home medications include glipizide 5 mg BID. Last A1c 5.8 on 04/14/2019.  Fasting glucose 29 today.  Received 4 units sliding scale insulin yesterday. -sSSI -Monitor CBGs  Diabetic Neuropathy  Patient on home gabapentin 300 mg TID. Patient reports only taking BID.  -Hold gabapentin 300mg  TID    Gout  Home medications include allopurinol 100mg  BID and colchicine 0.6mg  BID as needed when symptomatic.   FEN/GI: Replete electrolytes as needed,  dysphagia 3 diet PPx: Heparin  Disposition: Likely rehab facility, pending medical clearance   Subjective:  Troy Wells and no significant overnight events.   Objective: Temp:  [98.2 F (36.8 C)-100 F (37.8 C)] 100 F (37.8 C) (11/27 0708) Pulse Rate:  [108-124] 111 (11/27 0708) Resp:  [19-31] 24 (11/27 0708) BP: (127-154)/(79-97) 140/88 (11/27 0708) SpO2:  [99 %-100 %] 100 % (11/27 0708) Weight:  [97.7 kg] 97.7 kg (11/27  0500) Physical Exam: General: Somnolent, in no acute  distress Cardiovascular: Regular rate and rhythm, distal pulses intact Respiratory: Auscultation bilaterally, no increased work of breathing Abdomen: Obese abdomen, soft, nontender, nondistended Extremities: No lower extremity edema Neurological: Gross sensation intact, alert, responsive to verbal motor commands, minimally responsive verbally    Laboratory: Recent Labs  Lab 05/12/19 0349 05/13/19 0618 05/14/19 0529  WBC 25.3* 26.8* 24.0*  HGB 9.0* 10.3* 10.1*  HCT 27.7* 31.4* 29.9*  PLT 687* 672* 694*   Recent Labs  Lab 05/13/19 0618 05/13/19 1453 05/14/19 0529  NA 136 135 138  K 6.1* 6.0* 6.3*  CL 100 99 96*  CO2 15* 13* 14*  BUN 121* 141* 167*  CREATININE 9.99* 11.28* 12.73*  CALCIUM 10.8* 10.5* 11.1*  PROT 8.3*  --   --   BILITOT 0.8  --   --   ALKPHOS 98  --   --   ALT 44  --   --   AST 19  --   --   GLUCOSE 132* 201* 141*    Ca 12.4 > 10.8 11/26  Imaging/Diagnostic Tests: Dg Chest Port 1 View  Result Date: 05/13/2019 CLINICAL DATA:  Leukocytosis, history of hypertension diabetes, history of recent COVID-19 infection. EXAM: PORTABLE CHEST 1 VIEW COMPARISON:  05/10/2019 FINDINGS: Subtle added interstitial prominence at the right lung base. Also with subtle added density at the right lung apex similar to prior exams. Cardiomediastinal contours are stable with cardiomegaly and signs of atherosclerosis. No signs of dense consolidation or evidence of pleural effusion. No acute bone finding. IMPRESSION: 1. Subtle interstitial prominence at the right lung base could represent mild pneumonitis. No signs of dense consolidation or pleural effusion. 2. Subtle added density at right lung apex may represent an area of resolving airspace process from previous infection, attention on follow-up. 3. Stable cardiomegaly with signs of atherosclerosis. Electronically Signed   By: Zetta Bills M.D.   On: 05/13/2019 13:30     Lyndee Hensen, MD 05/14/2019, 9:59 AM PGY-1, [place] Intern pager: [phone], text pages welcome

## 2019-05-14 NOTE — Progress Notes (Signed)
Received patient from HD, Patient appears extremely drowsy. A &O x 1 recognizes name with eyes open and acknowledging but unable to respond. ST 127 B/P 104/65 ,HR 124, R 27, O2 100 RA,. Lungs coarse throughout lung fields. Abd. Soft ND Norma active BS, Anuric, wounds to cocyx area unstageable, left shoulder blister with foam gauze dressing. Bilater foot heel protectors in use. 18g right forearm sl. Will continue to monitor.

## 2019-05-14 NOTE — Consult Note (Signed)
WOC Nurse Consult Note: Patient receiving care in [facility] 2M11. Reason for Consult: evaluation of sacral wound Wound type: Unstageable PI to coccyx Pressure Injury POA: No Measurement: 6 cm x 2 cm x unknown depth Wound bed: 100% yellow slough surrounded by hypopigmented skin Drainage (amount, consistency, odor) none Periwound: intact Dressing procedure/placement/frequency: Apply Santyl to in a nickel thick layer. Cover with a saline moistened gauze, then dry gauze or ABD pad.  Change daily. Monitor the wound area(s) for worsening of condition such as: Signs/symptoms of infection,  Increase in size,  Development of or worsening of odor, Development of pain, or increased pain at the affected locations.  Notify the medical team if any of these develop. Val Riles, RN, MSN, CWOCN, CNS-BC, pager [phone]

## 2019-05-14 NOTE — Progress Notes (Signed)
Report given to [place], [place].Pt to be transported to 5W31

## 2019-05-14 NOTE — Progress Notes (Deleted)
Spoke w/ pts husband to provide updates. Pts husband appreciative.

## 2019-05-14 NOTE — Progress Notes (Signed)
FPTS Interim Progress Note  BP (!) 146/81   Pulse (!) 120   Temp 99.9 F (37.7 C) (Axillary)   Resp 19   Ht 5\' 10"  (1.778 m)   Wt 99.2 kg   SpO2 100%   BMI 31.38 kg/m    Spoke with Dr. Linus Salmons, Infectious disease physician, regarding neurovascular due to poor resistance to ceftriaxone potential need to treat with cefepime instead.  Patient not tachypneic or febrile and has been satting well on room air.  For treating patient with cefepime at this time concern for pneumonia low.  Lyndee Hensen, MD 05/14/2019, 1:34 PM PGY-1, [place] Medicine Service pager [phone]

## 2019-05-14 NOTE — Progress Notes (Signed)
Pts electrolytes/creatinine significantly elevated again today.  Spoke w/ HD who confirms pt is on schedule today. Will receive HD after 11am.

## 2019-05-14 NOTE — Progress Notes (Signed)
Spoke w/ pts dtr [place] to relay pt transferred to [phone].

## 2019-05-15 DIAGNOSIS — D72829 Elevated white blood cell count, unspecified: Secondary | ICD-10-CM | POA: Diagnosis not present

## 2019-05-15 DIAGNOSIS — U071 COVID-19: Secondary | ICD-10-CM | POA: Diagnosis not present

## 2019-05-15 DIAGNOSIS — N186 End stage renal disease: Secondary | ICD-10-CM | POA: Diagnosis not present

## 2019-05-15 DIAGNOSIS — Z992 Dependence on renal dialysis: Secondary | ICD-10-CM | POA: Diagnosis not present

## 2019-05-15 LAB — RENAL FUNCTION PANEL
Albumin: 2.8 g/dL — ABNORMAL LOW (ref 3.5–5.0)
Anion gap: 22 — ABNORMAL HIGH (ref 5–15)
BUN: 70 mg/dL — ABNORMAL HIGH (ref 6–20)
CO2: 23 mmol/L (ref 22–32)
Calcium: 10.4 mg/dL — ABNORMAL HIGH (ref 8.9–10.3)
Chloride: 92 mmol/L — ABNORMAL LOW (ref 98–111)
Creatinine, Ser: 7.02 mg/dL — ABNORMAL HIGH (ref 0.61–1.24)
GFR calc Af Amer: 9 mL/min — ABNORMAL LOW (ref >60)
GFR calc non Af Amer: 8 mL/min — ABNORMAL LOW (ref >60)
Glucose, Bld: 116 mg/dL — ABNORMAL HIGH (ref 70–99)
Phosphorus: 11 mg/dL — ABNORMAL HIGH (ref 2.5–4.6)
Potassium: 4.7 mmol/L (ref 3.5–5.1)
Sodium: 137 mmol/L (ref 135–145)

## 2019-05-15 LAB — GLUCOSE, CAPILLARY
Glucose-Capillary: 115 mg/dL — ABNORMAL HIGH (ref 70–99)
Glucose-Capillary: 154 mg/dL — ABNORMAL HIGH (ref 70–99)

## 2019-05-15 LAB — CBC
HCT: 27.1 % — ABNORMAL LOW (ref 39.0–52.0)
Hemoglobin: 9 g/dL — ABNORMAL LOW (ref 13.0–17.0)
MCH: 28 pg (ref 26.0–34.0)
MCHC: 33.2 g/dL (ref 30.0–36.0)
MCV: 84.2 fL (ref 80.0–100.0)
Platelets: 608 10*3/uL — ABNORMAL HIGH (ref 150–400)
RBC: 3.22 MIL/uL — ABNORMAL LOW (ref 4.22–5.81)
RDW: 17.7 % — ABNORMAL HIGH (ref 11.5–15.5)
WBC: 18.9 10*3/uL — ABNORMAL HIGH (ref 4.0–10.5)
nRBC: 0.4 % — ABNORMAL HIGH (ref 0.0–0.2)

## 2019-05-15 LAB — AMMONIA: Ammonia: 28 umol/L (ref 9–35)

## 2019-05-15 MED ORDER — HEPARIN SODIUM (PORCINE) 1000 UNIT/ML DIALYSIS
6000.0000 [IU] | INTRAMUSCULAR | Status: DC | PRN
  Filled 2019-05-15: qty 6

## 2019-05-15 NOTE — Progress Notes (Signed)
Pt's V lead reading ST elevation at 5.1. Notified Dr. Kris Mouton. EKG was obtained with a critical result of an acute MI/STEMI. Pt is asymptomatic with no active chest pain. MD aware. Will continue to monitor.

## 2019-05-15 NOTE — Progress Notes (Signed)
Family medicine progress update  Patient noted to have ST elevation on telemetry lead.  Repeat EKG which did show increased ST elevation in essentially all leads from 11/20.  Patient completely asymptomatic with no chest pain.  Discussed with Dr. Stanford Breed for cardiology who felt that patient's EKG finding was consistent with pericarditis, which he has likely had due to Covid.  Given patient does not have any chest pain recommendation was not to treat at this time.  We will continue on telemetry, if he develops chest pain we will repeat EKG.  Guadalupe Dawn MD PGY-3 Family Medicine Resident

## 2019-05-15 NOTE — Progress Notes (Signed)
EKG CRITICAL VALUE     12 lead EKG performed.  Critical value noted.  Caryl Comes, RN notified.   Genia Plants, [place] 05/15/2019 4:37 PM

## 2019-05-15 NOTE — Progress Notes (Signed)
Family Medicine Teaching Service Daily Progress Note Intern Pager: [phone]  Patient name: Troy Wells Medical record number: [id number] Date of birth: [birth year] Age: 53 y.o. Gender: male  Primary Care Provider: [name], MD Consultants: Neurology, critical care, nephrology Code Status: Full  Pt Overview and Major Events to Date:  04/27/2019 admitted to ICU 05/02/2019 MRI brain showing anoxic brain injury 05/10/2019: Extubated 05/12/2019: Started receiving care under FPTS  Assessment and Plan: Troy Wells is a 53 y.o. male transferred from ICU after cardiac arrest with recent history of COVID-19 infection.  PMH significant for ESRD MWF on HD, OSA, HTN, HLD, HFpEF, pHTN, type 2 diabetes, and diabetic neuropathy.  Encephalopathy  Possible anoxic brain injury  PEA cardiac arrest Appears to have improved somewhat this morning.  Able to speak full sentences, mostly appropriate.  Was conversing with wife and daughter on iPad.  Likely have lengthy recovery, but is expected to do well per neurology.  Current recommendation for placement is CIR and possibly SNF if not a candidate.  On dysphagia 3 diet with thin liquids per speech eval.  Sinus tachycardia seen on telemetry.  Updated patient's family via iPad, while at bedside today -Appreciate PT/OT working with patient -Social work to assist with placement, CIR preferred with SNF as backup -Continue cardiac monitoring for now, can likely DC soon -Monitor improvement from neuro standpoint  COVID (resolved)  Enterobacter HAP  Leukocytosis Patient now greater than 21 days out from Covid diagnosis, quarantine no longer needed even for dialysis.  Enterobacter cloacae from tracheal aspirate.  Treated with ceftriaxone, per ID no treatment needed at this time.  Blood culture no growth at 5 days from 11/15.  T-max 100 degrees greater than 24 hours ago. -Monitor fever curve -Monitor white blood cell count on daily CBC -Consider  reculture and chest x-ray if patient develops fever   ESRD on HD  Hypercalcemia  Secondary hyperparathyroidism Status post HD on 11/27.  Dialysis per nephro.  K4.7, BUN 70.  Outpatient schedule is TTS. -Nephrology following, appreciate recs -Daily renal function panel -Calcitonin 400 units twice daily x4 doses -Aranesp every Tuesday with HD  HFmEF  CAD ECHO on 04/27/19 40 to 45%.  LV with mild to moderate decreased fxn, mildly increased LVH, inferiolateral akinesis and anterolateral severe hypokinesis. Grade 2 diastolic dysfunction.    Patient down roughly 33 pounds(253-> 220) since admission.  Net negative around 5 L since admission, although I/Os have not been charted regularly over the last few days. -Continue aspirin 81 -Continue Lipitor 80 mg daily -Coreg 25 mg twice daily -Torsemide 100 mg daily on dialysis days and Sunday.  Hypertension/Tachycardia Heart rate persistently downtrending since starting home Coreg.  95 at last check.  Heart rate 115/75 since getting dialysis and starting Coreg.  We will continue to monitor. -Continue Coreg 25 mg twice daily -Continue amlodipine 10 mg daily -Continue to hold home minoxidil 20 mg twice daily  OSA with use of CPAP  OSA at baseline and uses CPAP intermittently.  Now 21+ days out from Covid diagnosis, can use CPAP if needed.  HLD Home medications include atorvastatin 40mg  -Atorvastatin 80mg  daily  Diabetes Home medications include glipizide 5 mg BID. Last A1c 5.8 on 04/14/2019.  Fasting glucose 29 today.  Received 0 units sliding scale insulin last 24 hours -sSSI -Monitor CBGs -Continue holding home glipizide  Diabetic Neuropathy  Patient on home gabapentin 300 mg TID. Patient reports only taking BID.  -Hold gabapentin 300mg  TID    Gout  Home  medications include allopurinol 100mg  daily and colchicine 0.6mg  BID as needed when symptomatic.   FEN/GI:  Dysphagia 3 diet PPx: Heparin 5000 units subcu 3 times daily    Disposition: Pending CIR approval, possible SNF if not approved for CIR   Subjective:  Doing well this morning.  Able to converse with his daughter and wife via iPad.  Able to answer some questions, able speak in some full sentences.   Objective: Temp:  [97.9 F (36.6 C)-99.9 F (37.7 C)] 98.7 F (37.1 C) (11/28 0400) Pulse Rate:  [82-129] 97 (11/28 0000) Resp:  [18-31] 19 (11/28 0435) BP: (82-146)/(41-81) 115/75 (11/28 0400) SpO2:  [97 %-100 %] 100 % (11/28 0400) Weight:  [99.2 kg-99.6 kg] 99.6 kg (11/28 0400) Physical Exam: General: No acute distress, awake, very pleasant, able speak some sentences Cardiovascular: Regular rate rhythm, no M/R/G, skin warm and dry Respiratory: Lungs clear to auscultation bilaterally, no increased work of breathing Abdomen: Obese abdomen, soft, nontender, nondistended Extremities: No lower extremity edema Neurological: No areas of decreased sensation noted, responsive to verbal stimuli, able to speak clear sentences   Laboratory: Recent Labs  Lab 05/13/19 0618 05/14/19 0529 05/15/19 0246  WBC 26.8* 24.0* 18.9*  HGB 10.3* 10.1* 9.0*  HCT 31.4* 29.9* 27.1*  PLT 672* 694* 608*   Recent Labs  Lab 05/13/19 0618 05/13/19 1453 05/14/19 0529 05/15/19 0246  NA 136 135 138 137  K 6.1* 6.0* 6.3* 4.7  CL 100 99 96* 92*  CO2 15* 13* 14* 23  BUN 121* 141* 167* 70*  CREATININE 9.99* 11.28* 12.73* 7.02*  CALCIUM 10.8* 10.5* 11.1* 10.4*  PROT 8.3*  --   --   --   BILITOT 0.8  --   --   --   ALKPHOS 98  --   --   --   ALT 44  --   --   --   AST 19  --   --   --   GLUCOSE 132* 201* 141* 116*    Imaging/Diagnostic Tests: No results found.   Guadalupe Dawn, MD 05/15/2019, 11:03 AM PGY-3, [place] Intern pager: [phone], text pages welcome

## 2019-05-15 NOTE — Progress Notes (Signed)
[facility] Progress Note  Subjective: min UF on HD yest. Pt seen in room, awake and alert, still very disoriented    Vitals:   05/15/19 0400 05/15/19 0422 05/15/19 0430 05/15/19 0435  BP: 115/75     Pulse:      Resp: (!) 22 18 19 19   Temp: 98.7 F (37.1 C)     TempSrc: Axillary     SpO2: 100%     Weight: 99.6 kg     Height:        Inpatient medications: . allopurinol  100 mg Oral Daily  . aspirin  81 mg Oral Daily  . atorvastatin  80 mg Oral Daily  . carvedilol  25 mg Oral BID WC  . Chlorhexidine Gluconate Cloth  6 each Topical Q0600  . collagenase   Topical Daily  . darbepoetin (ARANESP) injection - DIALYSIS  200 mcg Intravenous Q Tue-HD  . feeding supplement (NEPRO CARB STEADY)  237 mL Oral BID BM  . heparin injection (subcutaneous)  5,000 Units Subcutaneous Q8H  . insulin aspart  0-9 Units Subcutaneous TID WC  . multivitamin  1 tablet Oral QHS  . sevelamer carbonate  0.8 g Oral TID  . sodium zirconium cyclosilicate  10 g Oral TID  . torsemide  100 mg Oral Once per day on Sun Tue Thu Sat    acetaminophen    Exam:  awake and alert, Ox 1 only, responds to simple commands   No jvd   Chest cta bilat    Cor reg    Abd soft ntnd     Ext mild edema    Neuro alert, nonfocal    AVF +bruit   Outpt HD:TTS- [facility] 4.5h F250 124.5kg 400/800 2/2.25 Hep 12,000 AVF - mircera 30 q4 last 10/13 - hect 4   Summary: Pt is a 53 y.o. yo male with ESRD who was admitted on 04/27/2019 with resp failure due to COVID/pulm edema and cardiac arrest    Assessment/Plan:  1. Resp failure/ COVID+ pna/ vol overload - on room air now 2. ESRD - normally TTS via TDC. SP CRRT 11/18- 11/24. HD yest and HD today to get back on schedule.  3. Anemia- hgb dropping- s/p transfusion 11/16 and 11/19-  Increased his ESA to 200ug every Tuesday 4. MBD ckd - Ca++ high, see below. No vit D here, phos 7.2 cont binder 5. HTN/volume- down 20kg, no extra vol unable to  pull any fluid on 11/27 HD 6. AMS - somewhat better, neuro following 7. Hypercalcemia - most likely this is immobility related. Ca++> 12, started calcitonin SQ x 48 hrs on 11/25. Ca++ down. Use low Ca bath. Not getting any vit D here. Will need bisphosphonate if doesn't resolve soon.   Rob Doctor, hospital 05/15/2019, 1:21 PM  Iron/TIBC/Ferritin/ %Sat No results found for: IRON, TIBC, FERRITIN, IRONPCTSAT Recent Labs  Lab 05/15/19 0246  NA 137  K 4.7  CL 92*  CO2 23  GLUCOSE 116*  BUN 70*  CREATININE 7.02*  CALCIUM 10.4*  PHOS 11.0*  ALBUMIN 2.8*   Recent Labs  Lab 05/13/19 0618  AST 19  ALT 44  ALKPHOS 98  BILITOT 0.8  PROT 8.3*   Recent Labs  Lab 05/15/19 0246  WBC 18.9*  HGB 9.0*  HCT 27.1*  PLT 608*

## 2019-05-15 NOTE — Evaluation (Signed)
Physical Therapy Evaluation Patient Details Name: Ahkeem Goede MRN: [id number] DOB: Aug 10, 1965 Today's Date: 05/15/2019   History of Present Illness  53 yo male presenting to [facility] with cardiac arrest and CPR initatied by EMS. Pt at dialysis, complained of SOB, and became unresponsive. Pt with prior hospital admission (10/27 - 11/3) for COVID associated pneumonia; active problems of acute hypoxemic respiratory failure NSTEMI during admission. Intubated 11/10-11/23. MRI showing bilateral medial thalami is suspicious for anoxic injury and chronic R PICA cerebellar infarct.  Transitioned off of CRRT 05/11/19, femoral line removed 05/13/19.PMH including ESRD on HD, chronic anemia, chronic systolic heart failure, CAD, HTN, OSA on CPAP, DM2, and HLD.  Clinical Impression   Re-eval performed per MD request. Patient received in bed, flat affect and A&Ox1, following commands perhaps 20% of the time at best but also seems limited by significant apraxia today. MMT estimated at 3-/5 for BLEs, UEs estimated at 2+/5. Max skilled VC/TC provided during session. Required MaxAx1 for rolling, attempted supine to sit with HOB maximally elevated, able to perform partial transfer then patient became limited again by cognition and likely apraxia. Will require +2 to progress mobility, not available today unfortunately. He was repositioned in bed with totalAx2 and left in bed with all needs met, bed alarm active. Seems reasonable to continue to pursue CIR admit at this point given need for intensive skilled services to progress and improve mobility.     Follow Up Recommendations CIR;Supervision/Assistance - 24 hour    Equipment Recommendations  Hospital bed;Other (comment);Rolling walker with 5" wheels;3in1 (PT)(hoyer lift)    Recommendations for Other Services       Precautions / Restrictions Precautions Precautions: Fall;Other (comment) Precaution Comments: very apraxic, almost no initiation Restrictions Weight  Bearing Restrictions: No      Mobility  Bed Mobility Overal bed mobility: Needs Assistance Bed Mobility: Rolling;Supine to Sit;Sit to Supine Rolling: Max assist   Supine to sit: Max assist Sit to supine: Max assist   General bed mobility comments: attempted rolling and supine to sit with MaxAx1, patient very apraxic and with almost no initiation, will require +2 to progress  Transfers                 General transfer comment: needs +2 for safety  Ambulation/Gait             General Gait Details: needs +2 for safety  Stairs            Wheelchair Mobility    Modified Rankin (Stroke Patients Only)       Balance                                             Pertinent Vitals/Pain Pain Assessment: Faces Pain Score: 0-No pain Faces Pain Scale: No hurt Pain Intervention(s): Limited activity within patient's tolerance;Monitored during session    Home Living                        Prior Function                 Hand Dominance        Extremity/Trunk Assessment   Upper Extremity Assessment Upper Extremity Assessment: RUE deficits/detail;LUE deficits/detail RUE Deficits / Details: poor grip strength and poor gross motor with UE, MMT limited by command following but estimated at 2+/5 RUE Coordination: decreased fine  motor;decreased gross motor LUE Deficits / Details: poor grip strength and poor gross motor with UE, MMT limited by command following but estimated at 2+/5 LUE Coordination: decreased fine motor;decreased gross motor    Lower Extremity Assessment Lower Extremity Assessment: RLE deficits/detail;LLE deficits/detail RLE Deficits / Details: MMT limited by cognition/command following, able to briefly extend quad against gravity, estimate MMT 3-/5 RLE Coordination: decreased fine motor;decreased gross motor LLE Deficits / Details: MMT limited by cognition/command following, able to briefly extend quad against  gravity, estimate MMT 3-/5 LLE Coordination: decreased fine motor;decreased gross motor       Communication      Cognition Arousal/Alertness: Awake/alert Behavior During Therapy: Flat affect Overall Cognitive Status: Impaired/Different from baseline Area of Impairment: Orientation;Attention;Memory;Following commands;Safety/judgement;Awareness;Problem solving                 Orientation Level: Disoriented to;Place;Time;Situation Current Attention Level: Sustained Memory: Decreased short-term memory Following Commands: Follows one step commands inconsistently;Follows one step commands with increased time Safety/Judgement: Decreased awareness of deficits;Decreased awareness of safety Awareness: Intellectual Problem Solving: Difficulty sequencing;Requires verbal cues;Requires tactile cues;Slow processing;Decreased initiation General Comments: very apraxic, also hypophonic, Max VC and TC for sequencing and extended time      General Comments General comments (skin integrity, edema, etc.): unable to get to EOB with just +1 due to apraxia even with HOB elevated maximally and MaxA/Max cues    Exercises     Assessment/Plan    PT Assessment Patient needs continued PT services  PT Problem List Decreased strength;Decreased range of motion;Decreased activity tolerance;Decreased balance;Decreased mobility;Decreased cognition;Decreased coordination;Decreased safety awareness;Pain;Decreased skin integrity       PT Treatment Interventions DME instruction;Gait training;Functional mobility training;Therapeutic activities;Therapeutic exercise;Balance training;Cognitive remediation;Patient/family education    PT Goals (Current goals can be found in the Care Plan section)  Acute Rehab PT Goals Patient Stated Goal: none stated PT Goal Formulation: With patient Time For Goal Achievement: 05/29/19 Potential to Achieve Goals: Good    Frequency Min 3X/week   Barriers to discharge Decreased  caregiver support      Co-evaluation               AM-PAC PT "6 Clicks" Mobility  Outcome Measure Help needed turning from your back to your side while in a flat bed without using bedrails?: Total Help needed moving from lying on your back to sitting on the side of a flat bed without using bedrails?: Total Help needed moving to and from a bed to a chair (including a wheelchair)?: Total Help needed standing up from a chair using your arms (e.g., wheelchair or bedside chair)?: Total Help needed to walk in hospital room?: Total Help needed climbing 3-5 steps with a railing? : Total 6 Click Score: 6    End of Session   Activity Tolerance: Patient tolerated treatment well Patient left: in bed;with call bell/phone within reach;with bed alarm set   PT Visit Diagnosis: Other abnormalities of gait and mobility (R26.89);Muscle weakness (generalized) (M62.81);Difficulty in walking, not elsewhere classified (R26.2);Pain Pain - part of body: (sacrum)    Time: 6644-0347 PT Time Calculation (min) (ACUTE ONLY): 28 min   Charges:   PT Evaluation $PT Re-evaluation: 1 Re-eval PT Treatments $Therapeutic Activity: 8-22 mins        Windell Norfolk, DPT, PN1   Supplemental Physical Therapist [place]    Pager [phone] Acute Rehab Office [phone]

## 2019-05-16 DIAGNOSIS — N186 End stage renal disease: Secondary | ICD-10-CM | POA: Diagnosis not present

## 2019-05-16 DIAGNOSIS — J9601 Acute respiratory failure with hypoxia: Secondary | ICD-10-CM | POA: Diagnosis not present

## 2019-05-16 DIAGNOSIS — Z992 Dependence on renal dialysis: Secondary | ICD-10-CM | POA: Diagnosis not present

## 2019-05-16 LAB — GLUCOSE, CAPILLARY
Glucose-Capillary: 112 mg/dL — ABNORMAL HIGH (ref 70–99)
Glucose-Capillary: 110 mg/dL — ABNORMAL HIGH (ref 70–99)
Glucose-Capillary: 151 mg/dL — ABNORMAL HIGH (ref 70–99)
Glucose-Capillary: 140 mg/dL — ABNORMAL HIGH (ref 70–99)

## 2019-05-16 LAB — CBC
HCT: 28.7 % — ABNORMAL LOW (ref 39.0–52.0)
Hemoglobin: 9.1 g/dL — ABNORMAL LOW (ref 13.0–17.0)
MCH: 28.2 pg (ref 26.0–34.0)
MCHC: 31.7 g/dL (ref 30.0–36.0)
MCV: 88.9 fL (ref 80.0–100.0)
Platelets: 512 10*3/uL — ABNORMAL HIGH (ref 150–400)
RBC: 3.23 MIL/uL — ABNORMAL LOW (ref 4.22–5.81)
RDW: 18.3 % — ABNORMAL HIGH (ref 11.5–15.5)
WBC: 15 10*3/uL — ABNORMAL HIGH (ref 4.0–10.5)
nRBC: 0.2 % (ref 0.0–0.2)

## 2019-05-16 LAB — RENAL FUNCTION PANEL
Albumin: 2.8 g/dL — ABNORMAL LOW (ref 3.5–5.0)
Anion gap: 22 — ABNORMAL HIGH (ref 5–15)
BUN: 65 mg/dL — ABNORMAL HIGH (ref 6–20)
CO2: 24 mmol/L (ref 22–32)
Calcium: 10.6 mg/dL — ABNORMAL HIGH (ref 8.9–10.3)
Chloride: 93 mmol/L — ABNORMAL LOW (ref 98–111)
Creatinine, Ser: 6.8 mg/dL — ABNORMAL HIGH (ref 0.61–1.24)
GFR calc Af Amer: 10 mL/min — ABNORMAL LOW (ref >60)
GFR calc non Af Amer: 8 mL/min — ABNORMAL LOW (ref >60)
Glucose, Bld: 111 mg/dL — ABNORMAL HIGH (ref 70–99)
Phosphorus: 9.5 mg/dL — ABNORMAL HIGH (ref 2.5–4.6)
Potassium: 4.3 mmol/L (ref 3.5–5.1)
Sodium: 139 mmol/L (ref 135–145)

## 2019-05-16 NOTE — Progress Notes (Addendum)
Family medicine progress note  Called because there was concern that patient was on 4 L and satting at 92% although I do not see this documented anywhere in the chart.  Concern from nursing staff that patient should be on a Covid floor.  Directed their attention to Dr. Crissie Figures note from 11/24 stating that patient was beyond the 21-day window since initial diagnosis, so he can be discontinued from airborne/contact precaution and standard precautions to be kept along with every patient in the hospital.  There was concern about a positive test over 11 days ago, patient would still be well within the window for a positive test at that point but as he is now almost 1 month out from his diagnosis there is no concern that he has Covid at this time.  Also concerned about his ST segment elevation.  Again refer nursing staff to my previous note detailing my conversation with cardiology.  Given that he has absolutely no chest pain, had a normal echo from 3 days ago, and patient was asymptomatic cardiology recommended not treating the pericarditis. Concern that the apparent increased in his ST-segment elevation could indication new onset covid infection. Very unlikely given that the natural course of pericarditis is to slowly increase in intensity and then slowly resolve over weeks to months. There is extensive ongoing study and research into the natural course of covid pericarditis, but there is no concern he has a new active covid infection based on essentially incidental findings on tele and ekg.  Patient with known obstructive sleep apnea which is treated with BiPAP at home along with pulmonary hypertension.  As it turns out patient was not offered the BiPAP overnight which is likely resulting in his subjective transient hypoxia this morning.  There was nursing reference to a note stating that he lacked proper mentation for BiPAP, although this was not mentioned in the note yesterday. It appears that no attempt to  reach overnight physician was made to discuss this. In order to avoid desats would recommend giving the patient his home BiPAP at night.  Att his point, he is of proper mentation to use that treatment modality, and there is again no concern for Covid aerosolization to limit his ability to get BiPAP at night.  At this time there is no plan to transfer patient away from his current room in the progressive unit on 5 W.  We will plan to get routine Covid testing prior to placement at [facility] or SNF once he is medically ready to go.  Full daily progress note to follow.  Guadalupe Dawn MD PGY-3 Family Medicine Resident

## 2019-05-16 NOTE — Progress Notes (Signed)
Pt is resting comfortably at this time on 4L [place] without any distress. Pt is COVID + and BiPAP is not indicated at this time. RN notified.  RT will continue to monitor patient.

## 2019-05-16 NOTE — Progress Notes (Signed)
Family Medicine Teaching Service Daily Progress Note Intern Pager: [phone]  Patient name: Troy Wells Medical record number: [id number] Date of birth: 03/22/1966 Age: 53 y.o. Gender: male  Primary Care Provider: [name], MD Consultants: Neurology, critical care, nephrology Code Status: Full  Pt Overview and Major Events to Date:  04/27/2019 admitted to ICU 05/02/2019 MRI brain showing anoxic brain injury 05/10/2019: Extubated 05/12/2019: Started receiving care under FPTS  Assessment and Plan: Troy Wells is a 53 y.o. male transferred from ICU after cardiac arrest with recent history of COVID-19 infection.  PMH significant for ESRD MWF on HD, OSA, HTN, HLD, HFpEF, pHTN, type 2 diabetes, and diabetic neuropathy.  Encephalopathy  Possible anoxic brain injury  PEA cardiac arrest Patient was alert and verbally responsive this morning. Per neurology, continue supportive care and patient will have lengthy recovery. Ammonia WNL.  -Appreciate PT/OT working with patient -Social work to assist with placement, CIR preferred with SNF as backup -Continue cardiac monitoring for now, can likely DC soon -Monitor improvement from neuro standpoint -neurology consulted, appreciated recommendations  -SLP: Dysphagia 3 diet   COVID (resolved)  Enterobacter HAP  Leukocytosis Patient now greater than 21 days out from Covid diagnosis. Has been cleared by infectious disease. Enterobacter cloacae from tracheal aspirate.  Treated with CTX, per ID no additional treatment needed at this time. Leukocytosis down-trending 15.0 today from 18.9 yesterday. Continues to be aebrile. -Monitor fever curve -Monitor white blood cell count on daily CBC -Consider reculture and chest x-ray if patient develops fever -BCx on 05/02/19 negative  -Discontinued contact and airborne precautions, per ID   ESRD on HD  Hypercalcemia  Secondary hyperparathyroidism Status post HD ultrafiltration yesterday.   Dialysis per nephro.  K 4.3, BUN 65, Cr 6.8, Ca 10.6.  Outpatient schedule is TTS. SPEP and kappa/lamdba light chains pending.  -Nephrology following, appreciate recs -Daily renal function panel -Calcitonin 400 units twice daily x4 doses -Aranesp every Tuesday with HD -Follow up SPEP and kappa/lamdba  HFmEF  CAD ECHO on 04/27/19 40 to 45%.  LV with mild to moderate decreased fxn, mildly increased LVH, inferiolateral akinesis and anterolateral severe hypokinesis. Grade 2 diastolic dysfunction. -Continue aspirin 81 -Continue Lipitor 80 mg daily -Coreg 25 mg twice daily -Torsemide 100 mg daily on dialysis days and Sunday.  Hypertension  Tachycardia Normotensive, BP 110/77 and HR improving with HR range 77-118. Continue to monitor.  -Continue Coreg 25 mg twice daily -Continue amlodipine 10 mg daily -Continue to hold home minoxidil 20 mg twice daily  OSA with use of CPAP  OSA at baseline and uses CPAP intermittently.  Now 21+ days out from Covid diagnosis, can use CPAP if needed.   Diabetes Home medications include glipizide 5 mg BID. Last A1c 5.8 on 04/14/2019.  Fasting glucose 154 today.  Received 2 units sliding scale insulin in the  last 24 hours -sSSI -Monitor CBGs -Continue holding home glipizide  HLD Home medications include atorvastatin 40mg  -Atorvastatin 80mg  daily  Diabetic Neuropathy  Patient on home gabapentin 300 mg TID. Patient reports only taking BID.  -Hold gabapentin 300mg  TID    Gout  Home medications include allopurinol 100mg  daily and colchicine 0.6mg  BID as needed when symptomatic.   FEN/GI:  Dysphagia 3 diet PPx: Heparin 5000 units subcu 3 times daily   Disposition: Pending CIR approval, possible SNF if not approved for CIR   Subjective:  Troy Wells patient with diffuse ST elevations on EKG yesterday.  Cardiology, etilogy likely due to pericarditis due to recent Covid  infection.  Objective: Temp:  [97.5 F (36.4 C)-98.7 F (37.1 C)]  97.5 F (36.4 C) (11/29 0400) Pulse Rate:  [77-115] 115 (11/29 0400) Resp:  [14-20] 16 (11/29 0400) BP: (92-119)/(48-78) 110/77 (11/29 0400) SpO2:  [92 %-100 %] 92 % (11/29 0400) Weight:  [99.3 kg-99.6 kg] 99.3 kg (11/28 1933)  Physical Exam: GEN: Alert, verbally responsive, in no acute distress CV: Tachycardic, regular rhythm, distal pulses intact RESP: no increased work of breathing, clear to ascultation bilaterally with no crackles, wheezes, or rhonchi  ABD: Bowel sounds present. Soft, Nontender, Nondistended.  MSK: no lower extremity edema, or cyanosis noted, wearing heel protectors bilaterally  SKIN: warm, dry,  NEURO: Alert, oriented, verbally responsive to commands, moving extremities on command     Laboratory: Recent Labs  Lab 05/14/19 0529 05/15/19 0246 05/16/19 0402  WBC 24.0* 18.9* 15.0*  HGB 10.1* 9.0* 9.1*  HCT 29.9* 27.1* 28.7*  PLT 694* 608* 512*   Recent Labs  Lab 05/13/19 0618  05/14/19 0529 05/15/19 0246 05/16/19 0402  NA 136   < > 138 137 139  K 6.1*   < > 6.3* 4.7 4.3  CL 100   < > 96* 92* 93*  CO2 15*   < > 14* 23 24  BUN 121*   < > 167* 70* 65*  CREATININE 9.99*   < > 12.73* 7.02* 6.80*  CALCIUM 10.8*   < > 11.1* 10.4* 10.6*  PROT 8.3*  --   --   --   --   BILITOT 0.8  --   --   --   --   ALKPHOS 98  --   --   --   --   ALT 44  --   --   --   --   AST 19  --   --   --   --   GLUCOSE 132*   < > 141* 116* 111*   < > = values in this interval not displayed.    Imaging/Diagnostic Tests: No results found.   Troy Hensen, MD 05/16/2019, 8:04 AM PGY-1, Troy Wells Intern pager: [phone], text pages welcome

## 2019-05-16 NOTE — Progress Notes (Signed)
[facility] Progress Note  Subjective: Pt seen in room, no c/o    Vitals:   05/16/19 0400 05/16/19 0800 05/16/19 1009 05/16/19 1200  BP: 110/77 117/72  102/65  Pulse: (!) 115 100  99  Resp: 16 (!) 22  20  Temp: (!) 97.5 F (36.4 C)  (!) 97.5 F (36.4 C)   TempSrc: Oral  Oral   SpO2: 92% 99% 99% 99%  Weight:      Height:        Inpatient medications: . allopurinol  100 mg Oral Daily  . aspirin  81 mg Oral Daily  . atorvastatin  80 mg Oral Daily  . carvedilol  25 mg Oral BID WC  . Chlorhexidine Gluconate Cloth  6 each Topical Q0600  . collagenase   Topical Daily  . darbepoetin (ARANESP) injection - DIALYSIS  200 mcg Intravenous Q Tue-HD  . feeding supplement (NEPRO CARB STEADY)  237 mL Oral BID BM  . heparin injection (subcutaneous)  5,000 Units Subcutaneous Q8H  . insulin aspart  0-9 Units Subcutaneous TID WC  . multivitamin  1 tablet Oral QHS  . sevelamer carbonate  0.8 g Oral TID  . sodium zirconium cyclosilicate  10 g Oral TID  . torsemide  100 mg Oral Once per day on Sun Tue Thu Sat    acetaminophen, heparin    Exam:  awake and alert, Ox 1 only, responds to simple commands, pleasant   No jvd   Chest cta bilat    Cor reg    Abd soft ntnd     Ext mild edema    Neuro alert, nonfocal    AVF +bruit   Outpt HD:TTS- [facility] 4.5h F250 124.5kg 400/800 2/2.25 Hep 12,000 AVF - mircera 30 q4 last 10/13 - hect 4   Summary: Pt is a 53 y.o. yo male with ESRD who was admitted on 04/27/2019 with resp failure due to COVID/pulm edema and cardiac arrest    Assessment/Plan:  1. Resp failure/ COVID+ pna/ vol overload - on room air now and off precautions 2. ESRD - normally TTS via TDC. SP CRRT 11/18- 11/24. Next HD Tuesday.  3. Anemia ckd/ abl  -  s/p transfusion 11/16 and 11/19-  Increased his ESA to 200ug every Tuesday. Hb 9- 10 now.  4. MBD ckd - Ca++ high, see below. No vit D here, phos 7.2 cont binder 5. HTN/volume- down 20kg, no  extra vol unable to pull any fluid on 11/27 HD 6. AMS - somewhat better, neuro following, some ABI from  7. Hypercalcemia - most likely this is immobility related. Ca++> 12, sp calcitonin SQ x 48 hrs. Ca++ down to 10.6. Use low Ca bath. Not getting any vit D here. Will need bisphosphonate if recurs.   Rob Doctor, hospital 05/16/2019, 2:33 PM  Iron/TIBC/Ferritin/ %Sat No results found for: IRON, TIBC, FERRITIN, IRONPCTSAT Recent Labs  Lab 05/16/19 0402  NA 139  K 4.3  CL 93*  CO2 24  GLUCOSE 111*  BUN 65*  CREATININE 6.80*  CALCIUM 10.6*  PHOS 9.5*  ALBUMIN 2.8*   Recent Labs  Lab 05/13/19 0618  AST 19  ALT 44  ALKPHOS 98  BILITOT 0.8  PROT 8.3*   Recent Labs  Lab 05/16/19 0402  WBC 15.0*  HGB 9.1*  HCT 28.7*  PLT 512*

## 2019-05-16 NOTE — Progress Notes (Signed)
I spoke with Dr. Kris Mouton who explained that the patient has pericarditis that we are not going to treat the pericarditis based on the recommendation of cardiology.  They believe that his pericarditis is due to Covid and we will not be treating it.  They also say his Covid is outside of the window for being contagious so he does not need to be transferred to a Covid unit.  Dr. Kris Mouton said it is safe to have the patient on our floor and will not transfer pt to the covid units.   I have spoken with the charge nurse and also voiced my concerns to Dr. Kris Mouton that this patient should be transferred to a Covid unit where he can be treated more safely.

## 2019-05-17 DIAGNOSIS — N186 End stage renal disease: Secondary | ICD-10-CM | POA: Diagnosis not present

## 2019-05-17 DIAGNOSIS — G931 Anoxic brain damage, not elsewhere classified: Secondary | ICD-10-CM | POA: Diagnosis not present

## 2019-05-17 DIAGNOSIS — Z992 Dependence on renal dialysis: Secondary | ICD-10-CM | POA: Diagnosis not present

## 2019-05-17 DIAGNOSIS — D72829 Elevated white blood cell count, unspecified: Secondary | ICD-10-CM

## 2019-05-17 LAB — RENAL FUNCTION PANEL
Albumin: 2.5 g/dL — ABNORMAL LOW (ref 3.5–5.0)
Anion gap: 22 — ABNORMAL HIGH (ref 5–15)
BUN: 98 mg/dL — ABNORMAL HIGH (ref 6–20)
CO2: 23 mmol/L (ref 22–32)
Calcium: 9.9 mg/dL (ref 8.9–10.3)
Chloride: 91 mmol/L — ABNORMAL LOW (ref 98–111)
Creatinine, Ser: 9.85 mg/dL — ABNORMAL HIGH (ref 0.61–1.24)
GFR calc Af Amer: 6 mL/min — ABNORMAL LOW (ref >60)
GFR calc non Af Amer: 5 mL/min — ABNORMAL LOW (ref >60)
Glucose, Bld: 112 mg/dL — ABNORMAL HIGH (ref 70–99)
Phosphorus: 11.9 mg/dL — ABNORMAL HIGH (ref 2.5–4.6)
Potassium: 4.2 mmol/L (ref 3.5–5.1)
Sodium: 136 mmol/L (ref 135–145)

## 2019-05-17 LAB — CBC
HCT: 24.9 % — ABNORMAL LOW (ref 39.0–52.0)
Hemoglobin: 8.3 g/dL — ABNORMAL LOW (ref 13.0–17.0)
MCH: 28 pg (ref 26.0–34.0)
MCHC: 33.3 g/dL (ref 30.0–36.0)
MCV: 84.1 fL (ref 80.0–100.0)
Platelets: 449 10*3/uL — ABNORMAL HIGH (ref 150–400)
RBC: 2.96 MIL/uL — ABNORMAL LOW (ref 4.22–5.81)
RDW: 17.1 % — ABNORMAL HIGH (ref 11.5–15.5)
WBC: 16.2 10*3/uL — ABNORMAL HIGH (ref 4.0–10.5)
nRBC: 0.2 % (ref 0.0–0.2)

## 2019-05-17 LAB — GLUCOSE, CAPILLARY
Glucose-Capillary: 26 mg/dL — CL (ref 70–99)
Glucose-Capillary: 101 mg/dL — ABNORMAL HIGH (ref 70–99)
Glucose-Capillary: 160 mg/dL — ABNORMAL HIGH (ref 70–99)
Glucose-Capillary: 123 mg/dL — ABNORMAL HIGH (ref 70–99)

## 2019-05-17 LAB — KAPPA/LAMBDA LIGHT CHAINS
Kappa free light chain: 287.3 mg/L — ABNORMAL HIGH (ref 3.3–19.4)
Kappa, lambda light chain ratio: 1.65 (ref 0.26–1.65)
Lambda free light chains: 174.3 mg/L — ABNORMAL HIGH (ref 5.7–26.3)

## 2019-05-17 MED ORDER — TORSEMIDE 20 MG PO TABS: 100.0000 mg | ORAL_TABLET | ORAL | Status: DC

## 2019-05-17 MED ORDER — CHLORHEXIDINE GLUCONATE CLOTH 2 % EX PADS
6.0000 | MEDICATED_PAD | Freq: Every day | CUTANEOUS | Status: DC
  Administered 2019-05-17 – 2019-05-24 (×7): 6 via TOPICAL

## 2019-05-17 MED ORDER — TORSEMIDE 20 MG PO TABS
100.0000 mg | ORAL_TABLET | ORAL | Status: DC
  Administered 2019-05-17: 100 mg via ORAL
  Filled 2019-05-17: qty 5

## 2019-05-17 MED ORDER — SEVELAMER CARBONATE 2.4 G PO PACK
2.4000 g | PACK | Freq: Three times a day (TID) | ORAL | Status: DC
  Administered 2019-05-18 – 2019-06-16 (×61): 2.4 g via ORAL
  Filled 2019-05-17 (×94): qty 1

## 2019-05-17 MED ORDER — SODIUM ZIRCONIUM CYCLOSILICATE 10 G PO PACK: 10.0000 g | PACK | Freq: Every day | ORAL | Status: DC

## 2019-05-17 NOTE — Progress Notes (Signed)
Physical Therapy Treatment Patient Details Name: Troy Wells MRN: [id number] DOB: Apr 14, 1966 Today's Date: 05/17/2019    History of Present Illness 53 yo male presenting to [facility] with cardiac arrest and CPR initatied by EMS. Pt at dialysis, complained of SOB, and became unresponsive. Pt with prior hospital admission (10/27 - 11/3) for COVID associated pneumonia; active problems of acute hypoxemic respiratory failure NSTEMI during admission. Intubated 11/10-11/23. MRI showing bilateral medial thalami is suspicious for anoxic injury and chronic R PICA cerebellar infarct.  Transitioned off of CRRT 05/11/19, femoral line removed 05/13/19.PMH including ESRD on HD, chronic anemia, chronic systolic heart failure, CAD, HTN, OSA on CPAP, DM2, and HLD.    PT Comments    Patient received in bed, continues to barely be A&Ox1, flat affect but willing to participate in PT today. Required MaxAx2 for functional bed mobility, once at EOB initially needed ModA to maintain balance but this faded to min guard with extended sitting. Able to maintain sitting for perhaps 7-8 minutes but did fatigue. Attempted B LE exercises while sitting but limited by reduced cognition/command following and fatigue. He was returned to supine with MaxAx2 and positioned to comfort, all needs otherwise met this morning with bed alarm active.     Follow Up Recommendations  CIR;Supervision/Assistance - 24 hour     Equipment Recommendations  Hospital bed;Other (comment);Rolling walker with 5" wheels;3in1 (PT)(hoyer lift)    Recommendations for Other Services       Precautions / Restrictions Precautions Precautions: Fall;Other (comment) Precaution Comments: very apraxic, almost no initiation Restrictions Weight Bearing Restrictions: No    Mobility  Bed Mobility Overal bed mobility: Needs Assistance Bed Mobility: Supine to Sit;Sit to Supine     Supine to sit: Max assist;+2 for physical assistance Sit to supine: Max  assist;+2 for physical assistance   General bed mobility comments: MaxAx2 for boost to sitting at EOB, once up required [place] for sitting balance but this faded to min guard; able to sit approximately 7-8 minutes but very fatigued  Transfers                 General transfer comment: deferred, fatigue  Ambulation/Gait             General Gait Details: deferred, fatigue   Stairs             Wheelchair Mobility    Modified Rankin (Stroke Patients Only)       Balance Overall balance assessment: Needs assistance Sitting-balance support: Bilateral upper extremity supported;Feet supported Sitting balance-Leahy Scale: Fair Sitting balance - Comments: initially [place] for balance, faded to min guard       Standing balance comment: NT                            Cognition Arousal/Alertness: Awake/alert Behavior During Therapy: Flat affect Overall Cognitive Status: Impaired/Different from baseline Area of Impairment: Orientation;Attention;Memory;Following commands;Safety/judgement;Awareness;Problem solving                 Orientation Level: Disoriented to;Place;Time;Situation Current Attention Level: Sustained Memory: Decreased short-term memory Following Commands: Follows one step commands inconsistently;Follows one step commands with increased time Safety/Judgement: Decreased awareness of deficits;Decreased awareness of safety Awareness: Intellectual Problem Solving: Difficulty sequencing;Requires verbal cues;Requires tactile cues;Slow processing;Decreased initiation General Comments: max VC/TC and extended time, patient often stating "we'll get it together, it'll all be alright"      Exercises      General Comments  Pertinent Vitals/Pain Pain Assessment: Faces Pain Score: 0-No pain Faces Pain Scale: No hurt Pain Intervention(s): Limited activity within patient's tolerance;Monitored during session    Home Living                       Prior Function            PT Goals (current goals can now be found in the care plan section) Acute Rehab PT Goals Patient Stated Goal: none stated PT Goal Formulation: With patient Time For Goal Achievement: 05/29/19 Potential to Achieve Goals: Good Progress towards PT goals: Progressing toward goals    Frequency    Min 3X/week      PT Plan Current plan remains appropriate    Co-evaluation PT/OT/SLP Co-Evaluation/Treatment: Yes Reason for Co-Treatment: Complexity of the patient's impairments (multi-system involvement);Necessary to address cognition/behavior during functional activity;For patient/therapist safety;To address functional/ADL transfers PT goals addressed during session: Mobility/safety with mobility;Balance        AM-PAC PT "6 Clicks" Mobility   Outcome Measure  Help needed turning from your back to your side while in a flat bed without using bedrails?: A Lot Help needed moving from lying on your back to sitting on the side of a flat bed without using bedrails?: A Lot Help needed moving to and from a bed to a chair (including a wheelchair)?: Total Help needed standing up from a chair using your arms (e.g., wheelchair or bedside chair)?: Total Help needed to walk in hospital room?: Total Help needed climbing 3-5 steps with a railing? : Total 6 Click Score: 8    End of Session   Activity Tolerance: Patient tolerated treatment well Patient left: in bed;with call bell/phone within reach;with bed alarm set   PT Visit Diagnosis: Other abnormalities of gait and mobility (R26.89);Muscle weakness (generalized) (M62.81);Difficulty in walking, not elsewhere classified (R26.2);Pain Pain - part of body: (sacrum)     Time: 3845-3646 PT Time Calculation (min) (ACUTE ONLY): 26 min  Charges:  $Therapeutic Activity: 8-22 mins(co-tx with OT)                     Windell Norfolk, DPT, PN1   Supplemental Physical Therapist [place]    Pager  [phone] Acute Rehab Office [phone]

## 2019-05-17 NOTE — Progress Notes (Addendum)
Family Medicine Teaching Service Daily Progress Note Intern Pager: [phone]  Patient name: Troy Wells Medical record number: [id number] Date of birth: [birth year] Age: 53 y.o. Gender: male  Primary Care Provider: [name], MD Consultants: Neurology, critical care, nephrology Code Status: Full  Pt Overview and Major Events to Date:  04/27/2019 admitted to ICU 05/02/2019 MRI brain showing anoxic brain injury 05/10/2019: Extubated 05/12/2019: Started receiving care under FPTS  Assessment and Plan: Troy Wells is a 53 y.o. male transferred from ICU after cardiac arrest with recent history of COVID-19 infection.  PMH significant for ESRD MWF on HD, OSA, HTN, HLD, HFpEF, pHTN, type 2 diabetes, and diabetic neuropathy.  Encephalopathy  Possible anoxic brain injury  PEA cardiac arrest Continues to improve daily.  Per neurology, continue supportive care and patient will have lengthy recovery.   -Appreciate PT/OT working with patient -Social work to assist with placement, CIR preferred with SNF as backup -Continue cardiac monitoring for now, can likely DC soon -Monitor improvement from neuro standpoint -neurology consulted, appreciated recommendations  -SLP: Dysphagia 3 diet  -Social work consulted regarding placement   COVID (resolved)  Enterobacter HAP  Leukocytosis Patient now greater than 21 days out from Covid diagnosis. Has been cleared by infectious disease. Enterobacter cloacae from tracheal aspirate.  Treated with CTX, per ID no additional treatment needed at this time. Leukocytosis up-trending 16.2 today from 15.0 yesterday.  Afebrile.  T-max 99.4 F.  -Monitor fever curve -Monitor WBC on daily CBC -Consider reculture and chest x-ray if patient develops fever -BCx on 05/02/19 negative  -Discontinued contact and airborne precautions, per ID   ESRD on HD  Hypercalcemia  Secondary hyperparathyroidism Dialysis per nephro TTS schedule.  K 4.2, BUN 98, Cr 9.85,  Ca 9.9.  Outpatient schedule is TTS. SPEP and kappa/lamdba light chains pending.  -Nephrology following, appreciate recs -Daily renal function panel -Calcitonin 400 units twice daily x4 doses -Aranesp every Tuesday with HD -Follow up SPEP and kappa/lamdba  HFmEF  CAD ECHO on 04/27/19 40 to 45%.  LV with mild to moderate decreased fxn, mildly increased LVH, inferiolateral akinesis and anterolateral severe hypokinesis. Grade 2 diastolic dysfunction. -Continue aspirin 81 -Continue Lipitor 80 mg daily -Coreg 25 mg twice daily -Torsemide 100 mg daily on dialysis days and Sunday.  Hypertension  Tachycardia Normotensive, BP 130/69.  Not tachycardic this morning.  Heart rate range 92-100 yesterday. -Continue Coreg 25 mg twice daily -Continue amlodipine 10 mg daily -Continue to hold home minoxidil 20 mg twice daily  OSA with use of CPAP  OSA at baseline and uses CPAP intermittently.  Now 21+ days out from Covid diagnosis, can use CPAP if needed. -Encourage patient to use CPAP at bedtime   Diabetes Home medications include glipizide 5 mg BID. Last A1c 5.8 on 04/14/2019.  Fasting glucose 101 today.  Received 2 units sliding scale insulin in the last 24 hours -sSSI -Monitor CBGs -Continue holding home glipizide  HLD Home medications include atorvastatin 40mg  -Atorvastatin 80mg  daily  Diabetic Neuropathy  Patient on home gabapentin 300 mg TID. Patient reports only taking BID.  -Hold gabapentin 300mg  TID    Gout  Home medications include allopurinol 100mg  daily and colchicine 0.6mg  BID as needed when symptomatic.  -Continue home allopurinol  FEN/GI:  Dysphagia 3 diet PPx: Heparin 5000 units subcu 3 times daily   Disposition: Pending CIR approval, possible SNF if not approved for CIR   Subjective:  Troy Wells no significant overnight events.  Objective: Temp:  [97.5 F (36.4 C)-99.4  F (37.4 C)] 98.7 F (37.1 C) (11/30 0800) Pulse Rate:  [92-100] 92 (11/30  0800) Resp:  [13-21] 15 (11/30 0800) BP: (102-139)/(61-85) 130/69 (11/30 0800) SpO2:  [96 %-99 %] 98 % (11/30 0800) Weight:  [102.3 kg] 102.3 kg (11/30 0457)  Physical Exam: GEN: Drowsy as he has just woken up, verbally responsive, in no acute distress CV: Regular rate and rhythm, distal pulses intact RESP: Wearing 2 L nasal cannula, no increased work of breathing, clear to auscultation bilaterally ABD: Soft nontender nondistended no palpable masses MSK: No lower extremity edema,  SKIN: warm, dry, sacral soft tissue pressure injury NEURO: Verbally responsive, appropriate motor response to verbal commands     Laboratory: Recent Labs  Lab 05/15/19 0246 05/16/19 0402 05/17/19 0303  WBC 18.9* 15.0* 16.2*  HGB 9.0* 9.1* 8.3*  HCT 27.1* 28.7* 24.9*  PLT 608* 512* 449*   Recent Labs  Lab 05/13/19 0618  05/15/19 0246 05/16/19 0402 05/17/19 0303  NA 136   < > 137 139 136  K 6.1*   < > 4.7 4.3 4.2  CL 100   < > 92* 93* 91*  CO2 15*   < > 23 24 23   BUN 121*   < > 70* 65* 98*  CREATININE 9.99*   < > 7.02* 6.80* 9.85*  CALCIUM 10.8*   < > 10.4* 10.6* 9.9  PROT 8.3*  --   --   --   --   BILITOT 0.8  --   --   --   --   ALKPHOS 98  --   --   --   --   ALT 44  --   --   --   --   AST 19  --   --   --   --   GLUCOSE 132*   < > 116* 111* 112*   < > = values in this interval not displayed.    Imaging/Diagnostic Tests: No results found.   Lyndee Hensen, MD 05/17/2019, 8:52 AM PGY-1, [place] Intern pager: [phone], text pages welcome

## 2019-05-17 NOTE — Progress Notes (Signed)
Occupational Therapy Treatment Patient Details Name: Troy Wells MRN: [id number] DOB: March 20, 1966 Today's Date: 05/17/2019    History of present illness 53 yo male presenting to [facility] with cardiac arrest and CPR initatied by EMS. Pt at dialysis, complained of SOB, and became unresponsive. Pt with prior hospital admission (10/27 - 11/3) for COVID associated pneumonia; active problems of acute hypoxemic respiratory failure NSTEMI during admission. Intubated 11/10-11/23. MRI showing bilateral medial thalami is suspicious for anoxic injury and chronic R PICA cerebellar infarct.  Transitioned off of CRRT 05/11/19, femoral line removed 05/13/19.PMH including ESRD on HD, chronic anemia, chronic systolic heart failure, CAD, HTN, OSA on CPAP, DM2, and HLD.   OT comments  Pt seen for co-tx to address deficits with focus on bed mobility, sitting balance, and cognition. Pt required max multimodal cuing this session but able to initiate 20% of the time. Pt maintained sitting balance on EOB for several minutes with min guard for balance. Max +2 needed for safety and physical assist with sit <>supine this session. PT fatigues very quickly and declined attempting stand or transfer with goal to try that next session. Pt continues to benefit from OT intervention. Pt would still be good candidate for CIR with possible 15/7 schedule secondary to fatigue.   Follow Up Recommendations  CIR;Supervision/Assistance - 24 hour    Equipment Recommendations  Other (comment)(defer to next venue of care)       Precautions / Restrictions Precautions Precautions: Fall;Other (comment) Precaution Comments: very apraxic, almost no initiation Restrictions Weight Bearing Restrictions: No       Mobility Bed Mobility Overal bed mobility: Needs Assistance Bed Mobility: Supine to Sit;Sit to Supine     Supine to sit: Max assist;+2 for physical assistance Sit to supine: Max assist;+2 for physical assistance   General bed  mobility comments: MaxAx2 for boost to sitting at EOB, once up required [place] for sitting balance but this faded to min guard; able to sit approximately 7-8 minutes but very fatigued  Transfers      General transfer comment: deferred, fatigue    Balance Overall balance assessment: Needs assistance Sitting-balance support: Bilateral upper extremity supported;Feet supported Sitting balance-Leahy Scale: Fair Sitting balance - Comments: initially [place] for balance, faded to min guard       Standing balance comment: NT          ADL either performed or assessed with clinical judgement                  Cognition Arousal/Alertness: Awake/alert Behavior During Therapy: Flat affect Overall Cognitive Status: Impaired/Different from baseline Area of Impairment: Orientation;Attention;Memory;Following commands;Safety/judgement;Awareness;Problem solving       Orientation Level: Disoriented to;Place;Time;Situation Current Attention Level: Sustained Memory: Decreased short-term memory Following Commands: Follows one step commands inconsistently;Follows one step commands with increased time Safety/Judgement: Decreased awareness of deficits;Decreased awareness of safety Awareness: Intellectual Problem Solving: Difficulty sequencing;Requires verbal cues;Requires tactile cues;Slow processing;Decreased initiation General Comments: max VC/TC and extended time, patient often stating "we'll get it together, it'll all be alright"                   Pertinent Vitals/ Pain       Pain Assessment: Faces Pain Score: 0-No pain Faces Pain Scale: No hurt Pain Intervention(s): Limited activity within patient's tolerance;Monitored during session         Frequency  Min 2X/week        Progress Toward Goals  OT Goals(current goals can now be found in the care plan section)  Progress towards OT goals: Progressing toward goals  Acute Rehab OT Goals Patient Stated Goal: none stated OT  Goal Formulation: With patient Time For Goal Achievement: 05/31/19 Potential to Achieve Goals: Good  Plan Discharge plan remains appropriate    Co-evaluation    PT/OT/SLP Co-Evaluation/Treatment: Yes Reason for Co-Treatment: Complexity of the patient's impairments (multi-system involvement);Necessary to address cognition/behavior during functional activity;For patient/therapist safety;To address functional/ADL transfers PT goals addressed during session: Mobility/safety with mobility;Balance OT goals addressed during session: (functional mobility)      AM-PAC OT "6 Clicks" Daily Activity     Outcome Measure   Help from another person eating meals?: Total Help from another person taking care of personal grooming?: A Lot Help from another person toileting, which includes using toliet, bedpan, or urinal?: Total Help from another person bathing (including washing, rinsing, drying)?: A Lot Help from another person to put on and taking off regular upper body clothing?: A Lot Help from another person to put on and taking off regular lower body clothing?: Total 6 Click Score: 9    End of Session    OT Visit Diagnosis: Unsteadiness on feet (R26.81);Other abnormalities of gait and mobility (R26.89);Muscle weakness (generalized) (M62.81)   Activity Tolerance Patient tolerated treatment well   Patient Left in bed;with call bell/phone within reach;with bed alarm set;with restraints reapplied   Nurse Communication Mobility status        Time: 2951-8841 OT Time Calculation (min): 29 min  Charges: OT General Charges $OT Visit: 1 Visit OT Treatments $Therapeutic Activity: 8-22 mins   Darleen Crocker P MS, OTR/L 05/17/2019, 1:12 PM

## 2019-05-17 NOTE — NC FL2 (Signed)
[place] MEDICAID FL2 LEVEL OF CARE SCREENING TOOL     IDENTIFICATION  Patient Name: Troy Wells Birthdate: June 06, 1966 Sex: male Admission Date (Current Location): 04/27/2019  [facility] and [place] Number:  Herbalist and Address:  [facility] [place] [address], [place], [place] [address]      Provider Number: [id number]  Attending Physician Name and Address:  Dickie La, MD  Relative Name and Phone Number:  Marijo Conception, daughter, [phone]    Current Level of Care: Hospital Recommended Level of Care: [place] Prior Approval Number:    Date Approved/Denied:   PASRR Number: [id number]  Discharge Plan: SNF    Current Diagnoses: Patient Active Problem List   Diagnosis Date Noted  . Pressure injury of skin 05/10/2019  . Hyperkalemia   . ESRD (end stage renal disease) on dialysis ([place])   . Acute respiratory distress syndrome (ARDS) due to COVID-19 virus ([place])   . COVID-19   . Cardiac arrest ([place]) 04/27/2019  . Elevated transaminase measurement 04/23/2019  . Non-ST elevation (NSTEMI) myocardial infarction ([place])   . QT prolongation 04/16/2019  . COVID-19 virus detected 04/14/2019  . Pulmonary hypertension ([place]) 04/14/2019  . Lower respiratory tract infection due to COVID-19 virus 04/14/2019  . Acute respiratory failure with hypoxia ([place]) 04/14/2019  . Morbid obesity ([place]) 04/14/2019  . Gout 10/21/2018  . Diabetic neuropathy ([place]) 04/02/2016  . Hypertension associated with diabetes ([place]) 10/03/2015  . HLD (hyperlipidemia) 09/06/2015  . End-stage renal disease on hemodialysis ([name]) 08/31/2015  . DM (diabetes mellitus), type 2 with renal complications ([place]) 59/93/5701  . OSA treated with BiPAP 12/12/2013  . Coronary atherosclerosis 11/01/2009    Orientation RESPIRATION BLADDER Height & Weight     (Disoriented)  Normal(Has home CPAP) Continent Weight: 225 lb 8.5 oz (102.3 kg) Height:  5\' 10"  (177.8 cm)   BEHAVIORAL SYMPTOMS/MOOD NEUROLOGICAL BOWEL NUTRITION STATUS      Incontinent Diet(Please see DC Summary)  AMBULATORY STATUS COMMUNICATION OF NEEDS Skin   Extensive Assist Verbally PU Stage and Appropriate Care(Stage II on sacrum)                       Personal Care Assistance Level of Assistance  Bathing, Feeding, Dressing Bathing Assistance: Maximum assistance Feeding assistance: Maximum assistance Dressing Assistance: Maximum assistance     Functional Limitations Info  Sight, Hearing, Speech Sight Info: Impaired Hearing Info: Adequate Speech Info: Adequate    SPECIAL CARE FACTORS FREQUENCY  PT (By licensed PT), OT (By licensed OT)     PT Frequency: 5x OT Frequency: 5x            Contractures Contractures Info: Not present    Additional Factors Info  Code Status, Allergies, Insulin Sliding Scale Code Status Info: Full Allergies Info: NKA   Insulin Sliding Scale Info: 3x daily with meals       Current Medications (05/17/2019):  This is the current hospital active medication list Current Facility-Administered Medications  Medication Dose Route Frequency Provider Last Rate Last Dose  . acetaminophen (TYLENOL) tablet 650 mg  650 mg Oral Q4H PRN Magdalen Spatz, NP   650 mg at 05/05/19 0753  . allopurinol (ZYLOPRIM) tablet 100 mg  100 mg Oral Daily Martyn Malay, MD   100 mg at 05/17/19 1009  . aspirin chewable tablet 81 mg  81 mg Oral Daily Chesley Mires, MD   81 mg at 05/17/19 1009  . atorvastatin (LIPITOR) tablet 80  mg  80 mg Oral Daily Chesley Mires, MD   80 mg at 05/17/19 1009  . carvedilol (COREG) tablet 25 mg  25 mg Oral BID WC Milus Banister C, DO   25 mg at 05/17/19 0853  . Chlorhexidine Gluconate Cloth 2 % PADS 6 each  6 each Topical Q0600 Loren Racer, PA-C   6 each at 05/17/19 0530  . collagenase (SANTYL) ointment   Topical Daily Martyn Malay, MD      . Darbepoetin Alfa (ARANESP) injection 200 mcg  200 mcg Intravenous Q Tue-HD Corliss Parish, MD   200 mcg at 05/11/19 1607  . feeding supplement (NEPRO CARB STEADY) liquid 237 mL  237 mL Oral BID BM Leeanne Rio, MD   237 mL at 05/17/19 1535  . heparin injection 5,000 Units  5,000 Units Subcutaneous Q8H Chesley Mires, MD   5,000 Units at 05/17/19 1534  . heparin injection 6,000 Units  6,000 Units Dialysis PRN Roney Jaffe, MD      . insulin aspart (novoLOG) injection 0-9 Units  0-9 Units Subcutaneous TID WC Milus Banister C, DO   2 Units at 05/17/19 1328  . multivitamin (RENA-VIT) tablet 1 tablet  1 tablet Oral QHS Leeanne Rio, MD   1 tablet at 05/16/19 2214  . sevelamer carbonate (RENVELA) powder PACK 2.4 g  2.4 g Oral TID WC Loren Racer, PA-C      . [START ON 05/18/2019] sodium zirconium cyclosilicate (LOKELMA) packet 10 g  10 g Oral Daily Pearson Grippe B, MD      . torsemide [facility]) tablet 100 mg  100 mg Oral Once per day on Sun Mon Wed Fri Milus Banister C, DO   100 mg at 05/17/19 1328     Discharge Medications: Please see discharge summary for a list of discharge medications.  Relevant Imaging Results:  Relevant Lab Results:   Additional Information SSN: [id number]    COVID positive 10/18 but off of isolation per CDC protocol past 21 days.  Receives dialysis TTS at [place] while COVID positive  Benard Halsted, LCSW

## 2019-05-17 NOTE — TOC Initial Note (Signed)
Transition of Care [facility]) - Initial/Assessment Note    Patient Details  Name: Troy Wells MRN: [id number] Date of Birth: April 09, 1966  [facility] of [facility]) CM/SW Contact:    Benard Halsted, [place] Phone Number: 05/17/2019, 3:02 PM  Clinical Narrative:                 CSW received consult for possible SNF placement at time of discharge. CSW spoke with patient's daughter regarding PT recommendation of SNF placement at time of discharge if CIR is unable to accept patient. Patient's daughter reported that patient lives alone and family is currently unable to care for patient at their home given patient's current physical needs and fall risk. Patient's duaghter expressed understanding of PT recommendation and is requesting CIR placement. CSW discussed potential requirements of CIR such as home support and the ability to tolerate 3 hours of therapy. Patient's daughter expressed being hopeful for rehab and for patient to feel better soon. No further questions reported at this time. CSW to continue to follow and assist with discharge planning needs.   Expected Discharge Plan: Skilled Nursing Facility Barriers to Discharge: Continued Medical Work up   Patient Goals and CMS Choice   CMS Medicare.gov Compare Post Acute Care list provided to:: Patient Represenative (must comment)(Daughter) Choice offered to / list presented to : Adult Children  Expected Discharge Plan and Services Expected Discharge Plan: [place] In-house Referral: Clinical Social Work   Post Acute Care Choice: IP Rehab Living arrangements for the past 2 months: Apartment                 DME Arranged: N/A         HH Arranged: NA          Prior Living Arrangements/Services Living arrangements for the past 2 months: Apartment Lives with:: Self Patient language and need for interpreter reviewed:: Yes Do you feel safe going back to the place where you live?: Yes      Need for Family Participation  in Patient Care: Yes (Comment) Care giver support system in place?: Yes (comment)   Criminal Activity/Legal Involvement Pertinent to Current Situation/Hospitalization: No - Comment as needed  Activities of Daily Living Home Assistive Devices/Equipment: None ADL Screening (condition at time of admission) Patient's cognitive ability adequate to safely complete daily activities?: No Is the patient deaf or have difficulty hearing?: No Does the patient have difficulty seeing, even when wearing glasses/contacts?: No Does the patient have difficulty concentrating, remembering, or making decisions?: Yes Patient able to express need for assistance with ADLs?: No Does the patient have difficulty dressing or bathing?: Yes Independently performs ADLs?: No Communication: Dependent Is this a change from baseline?: Change from baseline, expected to last >3 days Dressing (OT): Dependent Is this a change from baseline?: Change from baseline, expected to last >3 days Grooming: Dependent Is this a change from baseline?: Change from baseline, expected to last >3 days Feeding: Dependent Is this a change from baseline?: Change from baseline, expected to last >3 days Bathing: Dependent Is this a change from baseline?: Change from baseline, expected to last >3 days Toileting: Dependent Is this a change from baseline?: Change from baseline, expected to last >3days In/Out Bed: Dependent Is this a change from baseline?: Change from baseline, expected to last >3 days Walks in Home: Needs assistance Is this a change from baseline?: Change from baseline, expected to last >3 days Does the patient have difficulty walking or climbing stairs?: Yes Weakness of Legs: Both  Weakness of Arms/Hands: Both  Permission Sought/Granted Permission sought to share information with : Facility Sport and exercise psychologist, Family Supports Permission granted to share information with : No  Share Information with NAME: Nuatica      Permission granted to share info w Relationship: Daughter  Permission granted to share info w Contact Information: [phone]  Emotional Assessment Appearance:: Appears stated age Attitude/Demeanor/Rapport: Unable to Assess Affect (typically observed): Unable to Assess Orientation: : (Disoriented) Alcohol / Substance Use: Not Applicable Psych Involvement: No (comment)  Admission diagnosis:  Respiratory arrest ([place]) [R09.2] Hyperkalemia [E87.5] Acute respiratory failure with hypoxia ([place]) [J96.01] Severe comorbid illness [R69] COVID-19 [U07.1] Patient Active Problem List   Diagnosis Date Noted  . Pressure injury of skin 05/10/2019  . Hyperkalemia   . ESRD (end stage renal disease) on dialysis ([place])   . Acute respiratory distress syndrome (ARDS) due to COVID-19 virus ([place])   . COVID-19   . Cardiac arrest ([place]) 04/27/2019  . Elevated transaminase measurement 04/23/2019  . Non-ST elevation (NSTEMI) myocardial infarction ([place])   . QT prolongation 04/16/2019  . COVID-19 virus detected 04/14/2019  . Pulmonary hypertension ([place]) 04/14/2019  . Lower respiratory tract infection due to COVID-19 virus 04/14/2019  . Acute respiratory failure with hypoxia ([place]) 04/14/2019  . Morbid obesity ([place]) 04/14/2019  . Gout 10/21/2018  . Diabetic neuropathy ([place]) 04/02/2016  . Hypertension associated with diabetes ([place]) 10/03/2015  . HLD (hyperlipidemia) 09/06/2015  . End-stage renal disease on hemodialysis ([place]) 08/31/2015  . DM (diabetes mellitus), type 2 with renal complications ([place]) 15/17/6160  . OSA treated with BiPAP 12/12/2013  . Coronary atherosclerosis 11/01/2009   PCP:  [name], MD Pharmacy:   [facility] [address] ([place]), [place] - [address] [place] [address] Phone: [phone] Fax: [phone]  [facility] - [place], [place] - [address] [place], [place] [address] Phone: [phone] Fax: [phone]     Social Determinants of Health (SDOH) Interventions    Readmission Risk Interventions Readmission Risk Prevention Plan 05/17/2019  Transportation Screening Complete  Medication Review ([place]) Complete  PCP or Specialist appointment within 3-5 days of discharge Complete  HRI or [place] Complete  SW Recovery Care/Counseling Consult Complete  Palliative Care Screening Not [place] Complete  Some recent data might be hidden

## 2019-05-17 NOTE — Progress Notes (Signed)
Family Medicine Progress Note  Patient is ok to receive bipap/cpap at night as he is no longer considered covid positive as he now 33 days out from his initial positive test. >21 days marks the end of period patient is considered contagious per ID note from 11/24. The patient's clearance for no longer needing contact and airborne precautions (and receiving bipap) is not impacted by his positive test on 11/18.  Addendum to my note from 11/29. The patient's echocardiogram was from 3 weeks ago, not the previously entered 3 days. This documentation error does not change cardiology's recs as detailed previously in my note on 11/28.  Guadalupe Dawn MD PGY-3 Family Medicine Resident

## 2019-05-17 NOTE — Consult Note (Signed)
WOC Nurse Consult Note: Patient receiving care in [facility] 5w31. Reason for Consult: f/u on coccyx wound Wound type: unstageable PI Pressure Injury POA: No Measurement: 6 cm x 2 cm Wound bed: thinner yellow slough Drainage (amount, consistency, odor) serous on existing gauze dressing Periwound: intact Dressing procedure/placement/frequency: continue Santyl to area.  Will add a low air loss mattress. Monitor the wound area(s) for worsening of condition such as: Signs/symptoms of infection,  Increase in size,  Development of or worsening of odor, Development of pain, or increased pain at the affected locations.  Notify the medical team if any of these develop.  Val Riles, RN, MSN, CWOCN, CNS-BC, pager [phone]

## 2019-05-17 NOTE — Consult Note (Signed)
Physical Medicine and Rehabilitation Consult  Reason for Consult: Anoxic brain injury.  Referring Physician: Dr. Nori Riis   HPI: Troy Wells is a 53 y.o. male with history of ESRD, CAD, cardiac arrest 10/2009, OSA-on BIPAP, recent admission 10/27- 04/20/19 for Covid 19 PNA complicated by NSTEMI with cardiomyopathy.  He was readmitted on 04/27/19 after syncope followed by PEA arrest while at HD center requiring ACLS and ROSC in 13 minutes. He was intubated for acute hypoxemic respiratory failure and HD initiated for volume overload.  PCCM felt that inciting event likely due to hypoxemia related to fluid overload.  BLE dopplers negative for DVT. He was noted to have myoclonic activity BUE/BLE during HD on 11/11 and was loaded with Keppra due to concerns of seizure activity. MRI brain showed heterogeneous activity in bilateral medial thalami suspicious for ABI.  He had difficulty with vent wean and hospital course significant for flash pulmonary edema, encephalopathy with decrease in LOC, decompensation requiring CRRT briefly as well as enterobacter HCAP.  He was extubated to 4 L [place] by 11/23 and mentation slowly improving. He is tolerating HD and continues to have issues with hypoxia felt to be due to OSA?  He was noted to have ST changes on telemetry on 11/28 felt to be due to pericarditis from Covid per Dr. Stanford Breed who felt that no need for treatment as patient asymptomatic. Therapy evaluations done revealing cognitive deficits with apraxia as well as weakness due to debility. CIR recommended due to functional decline.     Review of Systems  Unable to perform ROS: Mental acuity      Past Medical History:  Diagnosis Date  . CARDIAC ARREST 11/01/2009   Qualifier: Diagnosis of  By: Selena Batten CMA, [place]    . Colon polyps 01/02/2016   Colonoscopy July 2017 - One 3 mm polyp in the transverse colon, removed with a cold biopsy forceps. Resected and retrieved. - One 3 mm polyp in the rectum, removed  with a cold biopsy forceps. Resected and retrieved. - Diverticulosis in the entire examined colon. - Non-bleeding internal hemorrhoids. - The examination was otherwise normal. - Significant looping which prolonged cecal    . Coronary artery disease 08/15/2009   with AMI  . Dialysis patient [facility]) 09-27-2014   mon, wed, and fri  . Diverticulosis of colon without hemorrhage 01/02/2016   Colonoscopy July 2017 - One 3 mm polyp in the transverse colon, removed with a cold biopsy forceps. Resected and retrieved. - One 3 mm polyp in the rectum, removed with a cold biopsy forceps. Resected and retrieved. - Diverticulosis in the entire examined colon. - Non-bleeding internal hemorrhoids. - The examination was otherwise normal. - Significant looping which prolonged cecal    . DM (diabetes mellitus), type 2 with renal complications ([place]) 2/40/9735  . Essential hypertension 11/01/2009   Qualifier: Diagnosis of  By: Selena Batten CMA, Jewel    . Hyperlipidemia   . Hypertension   . Mitral valve regurgitation 09/19/2015   Echo 09/2015   . OSA treated with BiPAP 06/17/2010  . Sleep apnea    cpap nightly    Past Surgical History:  Procedure Laterality Date  . AV FISTULA PLACEMENT Left 09/27/2014  . INSERTION OF ARTERIOVENOUS (AV) ARTEGRAFT ARM Left 04/02/2018   Procedure: INSERTION OF ARTERIOVENOUS (AV) ARTEGRAFT ARM LEFT UPPER ARM;  Surgeon: Waynetta Sandy, MD;  Location: [facility];  Service: Vascular;  Laterality: Left;  . INSERTION OF DIALYSIS CATHETER N/A 04/02/2018   Procedure: INSERTION OF  DIALYSIS CATHETER, right internal jugular;  Surgeon: Waynetta Sandy, MD;  Location: [facility];  Service: Vascular;  Laterality: N/A;  . NECK SURGERY     C6 & C7 replaced 30 yrs ago per pt  . REVISON OF ARTERIOVENOUS FISTULA Left 04/02/2018   Procedure: REVISION PLICATION OF ARTERIOVENOUS FISTULA ARM;  Surgeon: Waynetta Sandy, MD;  Location: [facility];  Service: Vascular;  Laterality: Left;  .  WISDOM TOOTH EXTRACTION      Family History  Problem Relation Age of Onset  . Hypertension Mother   . Heart disease Mother 54       cause of death  . Hypertension Father   . Aneurysm Father 42       brain aneurysm  . Heart disease Father 16       cause of death  . Hypertension Brother   . Alcohol abuse Brother   . Diabetes Brother   . Pancreatitis Brother   . Hypertension Brother   . Colon cancer Neg Hx     Social History:  Lives alone. Per reports that he quit smoking about 7 years ago. His smoking use included cigars. He has a 10.00 pack-year smoking history. He has never used smokeless tobacco.Per reports current alcohol use of about 1.0 standard drinks of alcohol per week. Per reports that he does not use drugs.    Allergies: No Known Allergies    Medications Prior to Admission  Medication Sig Dispense Refill  . albuterol (VENTOLIN HFA) 108 (90 Base) MCG/ACT inhaler Inhale 1-2 puffs into the lungs every 6 (six) hours as needed for wheezing or shortness of breath. 6.7 g 0  . allopurinol (ZYLOPRIM) 100 MG tablet Take 100 mg by mouth 2 (two) times daily.     Marland Kitchen amLODipine (NORVASC) 10 MG tablet Take 10 mg by mouth daily.    Marland Kitchen aspirin 81 MG EC tablet Take 81 mg by mouth daily.    Marland Kitchen atorvastatin (LIPITOR) 80 MG tablet Take 1 tablet (80 mg total) by mouth daily. 30 tablet 0  . AURYXIA 1 GM 210 MG(Fe) tablet Take 420-840 mg by mouth See admin instructions. 840 mg with meals, and 420 mg with snacks    . carvedilol (COREG) 25 MG tablet Take 1 tablet (25 mg total) by mouth 2 (two) times daily with a meal. 60 tablet 2  . cetirizine (ZYRTEC) 5 MG tablet Take 1 tablet (5 mg total) by mouth daily. (Patient taking differently: Take 5 mg by mouth daily as needed for allergies. ) 90 tablet 1  . colchicine 0.6 MG tablet Take 0.6 mg by mouth 2 (two) times daily as needed (gout).     Marland Kitchen gabapentin (NEURONTIN) 300 MG capsule TAKE ONE CAPSULE BY MOUTH THREE TIMES DAILY (Patient taking differently:  Take 300 mg by mouth at bedtime. ) 90 capsule 2  . glipiZIDE (GLUCOTROL) 5 MG tablet Take 5 mg by mouth 2 (two) times daily.     Marland Kitchen linagliptin (TRADJENTA) 5 MG TABS tablet Take 1 tablet (5 mg total) by mouth daily. 30 tablet 0  . minoxidil (LONITEN) 10 MG tablet Take 2 tablets (20 mg total) by mouth 2 (two) times daily. 360 tablet 3  . multivitamin (RENA-VIT) TABS tablet Take 1 tablet by mouth at bedtime.    Marland Kitchen PRESCRIPTION MEDICATION Pt has CPAP machine    . torsemide (DEMADEX) 100 MG tablet Take 100 mg by mouth See admin instructions. 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 Functional Status:  Mobility: Bed Mobility Overal bed mobility: Needs Assistance Bed Mobility: Supine to Sit, Sit to Supine Rolling: Max assist Supine to sit: Max assist, +2 for physical assistance Sit to supine: Max assist, +2 for physical assistance General bed mobility comments: MaxAx2 for boost to sitting at EOB, once up required Adwolf for sitting balance but this faded to min guard; able to sit approximately 7-8 minutes but very fatigued Transfers General transfer comment: deferred, fatigue Ambulation/Gait General Gait Details: deferred, fatigue    ADL: ADL Overall ADL's : Needs assistance/impaired Eating/Feeding: NPO, Bed level Grooming: Oral care, Bed level, Maximal assistance, Minimal assistance Grooming Details (indicate cue type and reason): Max A to bring RUE to his face and to perform bilateral coorindation to hold cup with mouth wash and then dip swab into cup. With increased time, pt progressing towards bringing hand to mouth with Min A.  Upper Body  Bathing: Maximal assistance, Bed level Lower Body Bathing: Total assistance, Bed level Upper Body Dressing : Maximal assistance, Bed level Lower Body Dressing: Total assistance, Bed level Toilet Transfer: Total assistance General ADL Comments: Max-Total A for ADLs due to weakness and poor cognition.   Cognition: Cognition Overall Cognitive Status: Impaired/Different from baseline Orientation Level: Disoriented to time, Disoriented to situation Cognition Arousal/Alertness: Awake/alert Behavior During Therapy: Flat affect Overall Cognitive Status: Impaired/Different from baseline Area of Impairment: Orientation, Attention, Memory, Following commands, Safety/judgement, Awareness, Problem solving Orientation Level: Disoriented to, Place, Time, Situation Current Attention Level: Sustained Memory: Decreased short-term memory Following Commands: Follows one step commands inconsistently, Follows one step commands with increased time Safety/Judgement: Decreased awareness of deficits, Decreased awareness of safety Awareness: Intellectual Problem Solving: Difficulty sequencing, Requires verbal cues, Requires tactile cues, Slow processing, Decreased initiation General Comments: max VC/TC and extended time, patient often stating "we'll get it together, it'll all be alright" Difficult to assess due to: Impaired communication  Blood pressure 115/68, pulse 98, temperature 98.3 F (36.8 C), temperature source Oral, resp. rate 18, height 5\' 10"  (1.778 m), weight 102.3 kg, SpO2 99 %. Physical Exam  Nursing note and vitals reviewed. Constitutional: He appears well-developed and well-nourished. He appears lethargic. He is easily aroused.  Healing abrasion right forehead.   Respiratory: Stridor present. No respiratory distress.  GI: He exhibits no distension. There is no abdominal tenderness.  Musculoskeletal:        General: No edema.  Neurological: He is easily aroused. He appears lethargic.   Oriented to self, place as ICU but unable to state name of hospital, situation, age or DOB. Delayed processing and lacks awareness of deficits.  Dysphonic speech. Able to follow simple one step motor commands with increased time.   Skin: Skin is warm and dry.  MASD with bleeding on sacral dressing.   Motor strength is difficult to assess secondary to difficulty with following manual muscle testing instructions.  With hands-on cueing MMT is as follows 3 - bilateral deltoid 4 - bilateral bicep 4 - bilateral finger flexors and extensors 3 - bilateral hip flexors 4 - bilateral knee extensors 4 - bilateral ankle dorsiflexors and plantar flexors. Sensation is difficult to assess secondary to attention and concentration he does withdraw to pinch in the upper and lower limbs.  Results  for orders placed or performed during the hospital encounter of 04/27/19 (from the past 24 hour(s))  Glucose, capillary     Status: Abnormal   Collection Time: 05/16/19  4:58 PM  Result Value Ref Range   Glucose-Capillary 151 (H) 70 - 99 mg/dL  Glucose, capillary     Status: Abnormal   Collection Time: 05/16/19  9:32 PM  Result Value Ref Range   Glucose-Capillary 140 (H) 70 - 99 mg/dL  CBC     Status: Abnormal   Collection Time: 05/17/19  3:03 AM  Result Value Ref Range   WBC 16.2 (H) 4.0 - 10.5 K/uL   RBC 2.96 (L) 4.22 - 5.81 MIL/uL   Hemoglobin 8.3 (L) 13.0 - 17.0 g/dL   HCT 24.9 (L) 39.0 - 52.0 %   MCV 84.1 80.0 - 100.0 fL   MCH 28.0 26.0 - 34.0 pg   MCHC 33.3 30.0 - 36.0 g/dL   RDW 17.1 (H) 11.5 - 15.5 %   Platelets 449 (H) 150 - 400 K/uL   nRBC 0.2 0.0 - 0.2 %  Renal function panel     Status: Abnormal   Collection Time: 05/17/19  3:03 AM  Result Value Ref Range   Sodium 136 135 - 145 mmol/L   Potassium 4.2 3.5 - 5.1 mmol/L   Chloride 91 (L) 98 - 111 mmol/L   CO2 23 22 - 32 mmol/L   Glucose, Bld 112 (H) 70 - 99 mg/dL   BUN 98 (H) 6 - 20 mg/dL   Creatinine, Ser 9.85 (H) 0.61 - 1.24 mg/dL   Calcium  9.9 8.9 - 10.3 mg/dL   Phosphorus 11.9 (H) 2.5 - 4.6 mg/dL   Albumin 2.5 (L) 3.5 - 5.0 g/dL   GFR calc non Af Amer 5 (L) >60 mL/min   GFR calc Af Amer 6 (L) >60 mL/min   Anion gap 22 (H) 5 - 15  Glucose, capillary     Status: Abnormal   Collection Time: 05/17/19  8:09 AM  Result Value Ref Range   Glucose-Capillary 101 (H) 70 - 99 mg/dL  Glucose, capillary     Status: Abnormal   Collection Time: 05/17/19 12:10 PM  Result Value Ref Range   Glucose-Capillary 160 (H) 70 - 99 mg/dL   No results found.  Assessment/Plan: Diagnosis: Anoxic encephalopathy following PEA arrest 1. Does the need for close, 24 hr/day medical supervision in concert with the patient's rehab needs make it unreasonable for this patient to be served in a less intensive setting? Potentially 2. 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 Due to bladder management, bowel management, safety, skin/wound care, disease management, medication administration, pain management and patient education, does the patient require 24 hr/day rehab nursing? Yes 4. Does the patient require coordinated care of a physician, rehab nurse, therapy disciplines of PT, OT, speech to address physical and functional deficits in the context of the above medical diagnosis(es)? Potentially Addressing deficits in the following areas: balance, endurance, locomotion, strength, transferring, bowel/bladder control, bathing, dressing, feeding, grooming, toileting, cognition, speech, language and psychosocial support 5. Can the patient actively participate in an intensive therapy program of at least 3 hrs of therapy per day at least 5 days per week? No 6. The potential for patient to make measurable gains while on inpatient rehab is poor 7. Anticipated functional outcomes upon discharge from inpatient rehab are n/a  with PT, n/a with OT, n/a with SLP.  8. Estimated rehab  length of stay to reach the above functional goals is: NA 9. Anticipated discharge destination: Home versus SNF 10. Overall Rehab/Functional Prognosis: fair  RECOMMENDATIONS: This patient's condition is appropriate for continued rehabilitative care in the following setting: SNF Patient has agreed to participate in recommended program. N/A Note that insurance prior authorization may be required for reimbursement for recommended care.  Comment: If endurance improves may be able to tolerate a more aggressive inpatient rehabilitation program at CIR  "I have personally performed a face to face diagnostic evaluation of this patient.  Additionally, I have reviewed and concur with the physician assistant's documentation above."  Bary Leriche, PA-C 05/17/2019   Charlett Blake M.D. [facility] FAAPM&R (Sports Med, Neuromuscular Med) Diplomate Am Board of Electrodiagnostic Med

## 2019-05-17 NOTE — Care Management Important Message (Signed)
Important Message  Patient Details  Name: Troy Wells MRN: [id number] Date of Birth: 12/14/1965   Medicare Important Message Given:  Yes     Kaylise Blakeley Montine Circle 05/17/2019, 4:03 PM

## 2019-05-17 NOTE — Progress Notes (Signed)
[facility] Progress Note   Subjective:  Seen in room - no new concerns today. Denies dyspnea.  Objective Vitals:   05/16/19 2354 05/17/19 0457 05/17/19 0500 05/17/19 0800  BP: 119/76 139/85 139/85 130/69  Pulse: 94 100 99 92  Resp: 16 15 (!) 21 15  Temp: 98.1 F (36.7 C)  99.4 F (37.4 C) 98.7 F (37.1 C)  TempSrc: Oral  Oral Oral  SpO2: 98% 98% 99% 98%  Weight:  102.3 kg    Height:       Physical Exam General: Chronically ill appearing man, NAD. Awake. Heart: RRR; 2/6 murmur Lungs: CTA anteriorly Abdomen: soft, non-tender Extremities: No LE edema Dialysis Access: AVF + thrill  Additional Objective Labs: Basic Metabolic Panel: Recent Labs  Lab 05/15/19 0246 05/16/19 0402 05/17/19 0303  NA 137 139 136  K 4.7 4.3 4.2  CL 92* 93* 91*  CO2 23 24 23   GLUCOSE 116* 111* 112*  BUN 70* 65* 98*  CREATININE 7.02* 6.80* 9.85*  CALCIUM 10.4* 10.6* 9.9  PHOS 11.0* 9.5* 11.9*   Liver Function Tests: Recent Labs  Lab 05/13/19 0618  05/15/19 0246 05/16/19 0402 05/17/19 0303  AST 19  --   --   --   --   ALT 44  --   --   --   --   ALKPHOS 98  --   --   --   --   BILITOT 0.8  --   --   --   --   PROT 8.3*  --   --   --   --   ALBUMIN 3.1*   < > 2.8* 2.8* 2.5*   < > = values in this interval not displayed.   CBC: Recent Labs  Lab 05/12/19 0349 05/13/19 0618 05/14/19 0529 05/15/19 0246 05/16/19 0402 05/17/19 0303  WBC 25.3* 26.8* 24.0* 18.9* 15.0* 16.2*  NEUTROABS 18.1*  --   --   --   --   --   HGB 9.0* 10.3* 10.1* 9.0* 9.1* 8.3*  HCT 27.7* 31.4* 29.9* 27.1* 28.7* 24.9*  MCV 87.1 86.0 84.5 84.2 88.9 84.1  PLT 687* 672* 694* 608* 512* 449*   Blood Culture    Component Value Date/Time   SDES TRACHEAL ASPIRATE 05/05/2019 1235   SPECREQUEST NONE 05/05/2019 1235   CULT FEW ENTEROBACTER CLOACAE 05/05/2019 1235   REPTSTATUS 05/07/2019 FINAL 05/05/2019 1235   CBG: Recent Labs  Lab 05/16/19 0940 05/16/19 1334 05/16/19 1658 05/16/19 2132  05/17/19 0809  GLUCAP 112* 110* 151* 140* 101*   Medications:  . allopurinol  100 mg Oral Daily  . aspirin  81 mg Oral Daily  . atorvastatin  80 mg Oral Daily  . carvedilol  25 mg Oral BID WC  . Chlorhexidine Gluconate Cloth  6 each Topical Q0600  . collagenase   Topical Daily  . darbepoetin (ARANESP) injection - DIALYSIS  200 mcg Intravenous Q Tue-HD  . feeding supplement (NEPRO CARB STEADY)  237 mL Oral BID BM  . heparin injection (subcutaneous)  5,000 Units Subcutaneous Q8H  . insulin aspart  0-9 Units Subcutaneous TID WC  . multivitamin  1 tablet Oral QHS  . sevelamer carbonate  0.8 g Oral TID  . sodium zirconium cyclosilicate  10 g Oral TID  . torsemide  100 mg Oral Once per day on Sun Mon Wed Fri    Dialysis Orders: TTS- [facility] 4.5h F250 124.5kg 400/800 2/2.25 Hep 12,000 AVF - mircera 30 q4 last 10/13 -  hect 4  Summary: Pt is a44 y.o.yo malewith ESRD who was admitted on11/10/2020with resp failure due to COVID/pulm edema and cardiac arrest   Plan:  1. Hx COVID HCAP: S/p abx - off airborne precautions. 2. Pulm edema/volume overload: Improved. On nasal cannula oxygen. 3. ESRD: Required CRRT 11/18- 11/24 - now back on intermittent HD TTS - next 12/1. 4. Anemia (ESRD + ABLA): S/p 1U PRBC 11/16 and 11/19. Continue max Aranesp 252mcg weekly. Hgb 8.3. 5. Secondary HPTH/hypercalcemia: Most likely d/t immobilization. No VDRA - given calcitonin 400mg  BID 11/25-11/26. Follow for now. If rises again, will consider pamidronate. 2Ca bath. Phos sky high- on Renvela pwdr as binder, ^ dose to 2.4g TIW. 6. HTN/volume: S/p significant weight loss (~20kg) from admit. UF as tolerated - unable to get any off him on 11/27.  7. AMS: Felt to be anoxic brain injury - somewhat improved. Neuro following.  8. Hyperkalemia: Improved - on Lokelma 10mg  TID now - consider scaling back soon.  Veneta Penton, PA-C 05/17/2019, 11:17 AM  [facility] Pager: [phone]

## 2019-05-18 ENCOUNTER — Other Ambulatory Visit: Payer: Self-pay

## 2019-05-18 ENCOUNTER — Encounter (HOSPITAL_COMMUNITY): Payer: Self-pay

## 2019-05-18 DIAGNOSIS — Z992 Dependence on renal dialysis: Secondary | ICD-10-CM | POA: Diagnosis not present

## 2019-05-18 DIAGNOSIS — N186 End stage renal disease: Secondary | ICD-10-CM | POA: Diagnosis not present

## 2019-05-18 DIAGNOSIS — G931 Anoxic brain damage, not elsewhere classified: Secondary | ICD-10-CM | POA: Diagnosis not present

## 2019-05-18 LAB — PROTEIN ELECTROPHORESIS, SERUM
A/G Ratio: 0.9 (ref 0.7–1.7)
Albumin ELP: 3.4 g/dL (ref 2.9–4.4)
Alpha-1-Globulin: 0.3 g/dL (ref 0.0–0.4)
Alpha-2-Globulin: 1.2 g/dL — ABNORMAL HIGH (ref 0.4–1.0)
Beta Globulin: 1.2 g/dL (ref 0.7–1.3)
Gamma Globulin: 1.2 g/dL (ref 0.4–1.8)
Globulin, Total: 3.8 g/dL (ref 2.2–3.9)
Total Protein ELP: 7.2 g/dL (ref 6.0–8.5)

## 2019-05-18 LAB — CBC
HCT: 25.7 % — ABNORMAL LOW (ref 39.0–52.0)
HCT: 27.9 % — ABNORMAL LOW (ref 39.0–52.0)
Hemoglobin: 8.5 g/dL — ABNORMAL LOW (ref 13.0–17.0)
Hemoglobin: 9.5 g/dL — ABNORMAL LOW (ref 13.0–17.0)
MCH: 28.1 pg (ref 26.0–34.0)
MCH: 28.1 pg (ref 26.0–34.0)
MCHC: 33.1 g/dL (ref 30.0–36.0)
MCHC: 34.1 g/dL (ref 30.0–36.0)
MCV: 85.1 fL (ref 80.0–100.0)
MCV: 82.5 fL (ref 80.0–100.0)
Platelets: 397 10*3/uL (ref 150–400)
Platelets: 433 10*3/uL — ABNORMAL HIGH (ref 150–400)
RBC: 3.02 MIL/uL — ABNORMAL LOW (ref 4.22–5.81)
RBC: 3.38 MIL/uL — ABNORMAL LOW (ref 4.22–5.81)
RDW: 16.9 % — ABNORMAL HIGH (ref 11.5–15.5)
RDW: 16.7 % — ABNORMAL HIGH (ref 11.5–15.5)
WBC: 14.5 10*3/uL — ABNORMAL HIGH (ref 4.0–10.5)
WBC: 17.2 10*3/uL — ABNORMAL HIGH (ref 4.0–10.5)
nRBC: 0.1 % (ref 0.0–0.2)
nRBC: 0 % (ref 0.0–0.2)

## 2019-05-18 LAB — RENAL FUNCTION PANEL
Albumin: 2.5 g/dL — ABNORMAL LOW (ref 3.5–5.0)
Anion gap: 23 — ABNORMAL HIGH (ref 5–15)
BUN: 124 mg/dL — ABNORMAL HIGH (ref 6–20)
CO2: 22 mmol/L (ref 22–32)
Calcium: 9.8 mg/dL (ref 8.9–10.3)
Chloride: 90 mmol/L — ABNORMAL LOW (ref 98–111)
Creatinine, Ser: 12.12 mg/dL — ABNORMAL HIGH (ref 0.61–1.24)
GFR calc Af Amer: 5 mL/min — ABNORMAL LOW (ref >60)
GFR calc non Af Amer: 4 mL/min — ABNORMAL LOW (ref >60)
Glucose, Bld: 121 mg/dL — ABNORMAL HIGH (ref 70–99)
Phosphorus: >30 mg/dL — ABNORMAL HIGH (ref 2.5–4.6)
Potassium: 4.3 mmol/L (ref 3.5–5.1)
Sodium: 135 mmol/L (ref 135–145)

## 2019-05-18 LAB — GLUCOSE, CAPILLARY
Glucose-Capillary: 115 mg/dL — ABNORMAL HIGH (ref 70–99)
Glucose-Capillary: 88 mg/dL (ref 70–99)
Glucose-Capillary: 166 mg/dL — ABNORMAL HIGH (ref 70–99)
Glucose-Capillary: 124 mg/dL — ABNORMAL HIGH (ref 70–99)

## 2019-05-18 LAB — PHOSPHORUS: Phosphorus: 5.8 mg/dL — ABNORMAL HIGH (ref 2.5–4.6)

## 2019-05-18 MED ORDER — NEPRO/CARBSTEADY PO LIQD
237.0000 mL | Freq: Three times a day (TID) | ORAL | Status: DC
  Administered 2019-05-18 – 2019-06-15 (×48): 237 mL via ORAL
  Filled 2019-05-18 (×2): qty 237

## 2019-05-18 MED ORDER — LORAZEPAM 2 MG/ML IJ SOLN
INTRAMUSCULAR | Status: AC
  Filled 2019-05-18: qty 1

## 2019-05-18 MED ORDER — PRO-STAT SUGAR FREE PO LIQD
30.0000 mL | Freq: Two times a day (BID) | ORAL | Status: DC
  Administered 2019-05-18 – 2019-05-24 (×11): 30 mL via ORAL
  Filled 2019-05-18 (×13): qty 30

## 2019-05-18 MED ORDER — CARVEDILOL 12.5 MG PO TABS
12.5000 mg | ORAL_TABLET | Freq: Once | ORAL | Status: AC
  Administered 2019-05-18: 18:00:00 12.5 mg via ORAL
  Filled 2019-05-18: qty 1

## 2019-05-18 MED ORDER — DARBEPOETIN ALFA 200 MCG/0.4ML IJ SOSY
PREFILLED_SYRINGE | INTRAMUSCULAR | Status: AC
  Administered 2019-05-18: 200 ug via INTRAVENOUS
  Filled 2019-05-18: qty 0.4

## 2019-05-18 NOTE — Significant Event (Addendum)
Rapid Response Event Note  Overview:Pt d/t pt fall. RN found pt with back over siderail, feet in the air, and head on the floor. Pt was back in bed at my arrival. Time Called: 2110 Arrival Time: 2113 Event Type: Other (Comment)(fall)  Initial Focused Assessment: Pt awake and alert. Neuro status unchanged from when I saw him at [place]. Pt c/o of neck pain when MD pressed on back of neck. No visible signs of trauma seen. HR-108, BP-104/83, RR-19, SpO2-97 on RA.   Interventions: No RRT interventions    Plan of Care (if not transferred): MD examined pt and wants to monitor closely. Please call RRT if further assistance needed. Event Summary: Name of Physician Notified: Dr.Cresenzo at (2120)    at    Outcome: Stayed in room and stabalized  Event End Time: 2140  Dillard Essex

## 2019-05-18 NOTE — Progress Notes (Signed)
Dr. Fransico Michael was paged, has been at bedside, she saw vital signs.

## 2019-05-18 NOTE — Progress Notes (Signed)
Pt arrived from HD to 5W31. Pt is solmnent, barely rouses to voice, vitals and assessment as charted.

## 2019-05-18 NOTE — Significant Event (Signed)
Rapid Response Event Note  Overview: "Second Set of Eyes"  Initial Focused Assessment: RN called with concerns of patient being hard to arouse and having low BPs after HD. Per nurse, patient came back from HD was somnolent, she witnessed his eyes roll back in his head. FMTS MDs were at the bedside when I arrived. BP was 98/30 (76) - /HR 110s, 99% on 2L [place], RR 18-22, lung sounds - clear in the upper fields, coarse crackles in the posterior bases, overall good air movement. Patient was able to follow my commands, answer my questions, was drowsy but would wake. Able to track my fingers, pupils - reactive bilaterally, + cough on command. Skin seem well perfused. Blood sugar was 88. Repeat BP 111/65 (78). Not in acute distress  Interventions: -- No RRT Interventions   Plan of Care: -- Monitor VS, per FMTS MDs, MAP goal > 60 -- Monitor mental status/neurologic exam - some fluctuations to be expected given patient's longer clinical course and hospitalization. Patient just returned from HD, monitor closely over the next few hours.  -- Aspiration Precautions  -- Encourage OOB - PT/OT - IS and FV -- Rest per FMTS   Event Summary:  Call Time [place] Time 5997  End Time [place]   Koraima Albertsen R

## 2019-05-18 NOTE — Progress Notes (Addendum)
Paged Dr. Posey Pronto to report pt is somnolent and current vitals.

## 2019-05-18 NOTE — Progress Notes (Signed)
Dr. Christel Mormon is at bedside. Pt's daughter Marijo Conception is at bedside.

## 2019-05-18 NOTE — Progress Notes (Addendum)
Patient seen at bedside.  BP 108/72 manual.  Mildly tachycardic to 100s.  Patient awake and speaking, answers questions.  Discussed with Dr. Owens Shark.  Will give patient decreased dose of Coreg at 12.5mg  and continue to montior closely.  Likely that patient had too much taken off at HD today and is tired.  If BP decreases, further, can consider gentle fluids.    Update, verified that BP of 52/14 was not real and cuff was off.  Nurse reports BP of 97/63 on recheck.  Troy Wells, D.O.  PGY-2 Family Medicine  05/18/2019 5:50 PM

## 2019-05-18 NOTE — Progress Notes (Signed)
Nutrition Follow-up  RD working remotely.  DOCUMENTATION CODES:   Obesity unspecified  INTERVENTION:   If poor PO intake continues, recommend considering Cortrak NG tube placement and initiation of enteral nutrition. Pt with increased kcal and protein needs related to recent COVID-19 as well as pressure injury and ESRD on HD.  - Continue renal MVI daily  - Increase Nepro Shake po to TID, each supplement provides 425 kcal and 19 grams protein  - Add Pro-stat 30 ml po BID, each supplement provides 100 kcal and 15 grams of protein  NUTRITION DIAGNOSIS:   Increased nutrient needs related to acute illness (COVID-19) as evidenced by estimated needs.  Ongoing, being addressed via oral nutrition supplements  GOAL:   Patient will meet greater than or equal to 90% of their needs  Unmet  MONITOR:   PO intake, Supplement acceptance, Diet advancement, Labs, Weight trends, Skin, I & O's  REASON FOR ASSESSMENT:   Ventilator, Consult Enteral/tube feeding initiation and management  ASSESSMENT:   53 yo male admitted in PEA arrest after a syncopal episode at HD center. Intubated on admission. PMH includes recent COVID PNA (positive on 10/28), HTN, CAD, ESRD on HD, HLD, OSA, DM-2.  11/23 - extubated 11/25 - diet advanced to dysphagia 3 with thin liquids  Unable to speak with pt at this time. Pt in HD.  CIR consulted, recommending SNF placement.  Meal completion is poor. Pt accepting most of Nepro supplements per [facility] documentation. Pt is at risk for malnutrition. If poor PO intake continues, recommend considering Cortrak NG tube placement and initiation of enteral nutrition. Pt with increased kcal and protein needs related to recent COVID-19 as well as pressure injuries and ESRD on HD.  Weight down 26 lbs since admit. Per I/O's flowsheet, pt is net negative 11.4 L since admit. Suspect some of weight loss can be attributed to negative fluid balance. Per RN edema assessment, pt with mild  pitting generalized edema and mild pitting edema to BUE and BLE. Will continue to monitor weight trends.  Meal Completion: 0-30% x last 8 recorded meals  Medications reviewed and include: Nepro BID, SSI, rena-vit, Renvela  Labs reviewed: phosphorus 5.8, hemoglobin 9.5 CBG's: 115-160 x 24 hours  Diet Order:   Diet Order            DIET DYS 3 Room service appropriate? Yes with Assist; Fluid consistency: Thin  Diet effective now              EDUCATION NEEDS:   Not appropriate for education at this time  Skin:  Skin Assessment: Skin Integrity Issues: Stage II: sacrum  Last BM:  05/15/19  Height:   Ht Readings from Last 1 Encounters:  05/01/19 5\' 10"  (1.778 m)    Weight:   Wt Readings from Last 1 Encounters:  05/18/19 103.2 kg    Ideal Body Weight:  75.5 kg  BMI:  Body mass index is 32.65 kg/m.  Estimated Nutritional Needs:   Kcal:  3614-4315  Protein:  120-140 gm  Fluid:  1 L + UOP    Gaynell Face, MS, RD, LDN Inpatient Clinical Dietitian Pager: [phone] Weekend/After Hours: [phone]

## 2019-05-18 NOTE — Progress Notes (Signed)
PT Cancellation Note  Patient Details Name: Troy Wells MRN: [id number] DOB: [birth year]   Cancelled Treatment:    Reason Eval/Treat Not Completed: Medical issues which prohibited therapy.  Lethargic and had a rapid response called for his change in alertness and low BP after HD.  Will try again at another time.   Ramond Dial 05/18/2019, 2:46 PM    Mee Hives, PT MS Acute Rehab Dept. Number: [place] and [place]

## 2019-05-18 NOTE — Progress Notes (Signed)
Was called to the patient's room after reported fall.  When I arrived to the room the nurse was present setting up the bed alarms.  Per nurse the patient was found over his bed railing with his head near the ground and feet in the air.  Patient was put back in bed.  When asked why he tried to get out of bed he responded "I don't know".  Patient responded appropriately to questions.  He was able to move all extremities with baseline weakness.  There is no focal neurologic deficits.  There was no new contusions or abrasions to the patient's head.  Cervical spine and thoracic spine were mildly tender with no step-offs.  Per nurse the nurse tech was outside the room and did not hear a fall.  Given all of these findings do not think imaging is necessary at this time.  Patient was placed back in bed with guard rails up and bed alarm on.  Floor mats were put beside bed.

## 2019-05-18 NOTE — Progress Notes (Signed)
Inpatient Rehabilitation Admissions Coordinator  I spoke with pt's daughter by phone. I explained that pt currently not at a level to tolerate the intensity of an inpt rehab admit and that I was recommending SNF, He also has no caregiver support at home for she works. I have notified SW, [place], and we will sign off at this time.  Danne Baxter, RN, MSN Rehab Admissions Coordinator [phone] 05/18/2019 1:15 PM

## 2019-05-18 NOTE — Progress Notes (Signed)
Resident at bedside, said she was going to get someone else. No new orders given

## 2019-05-18 NOTE — Significant Event (Addendum)
Rapid Response Event Note  Overview: Called d/t seizure-like activity. Per RN, pt was pulled up in bed and began to tremor/shake. Per RN this was different than his normal tremors. This shaking lasted about 30secs-63min. Time Called: 1911 Arrival Time: 1920 Event Type: Neurologic  Initial Focused Assessment: Pt sitting up in bed with eyes opened, watching TV. Pt alert to self, will follow commands with delayed responses(baseline), and move all extremities to command. Pupils are 4 and brisk. T-100.4, HR-106, BP-107/75, RR-18, SpO2-100% on RA  Interventions: No RRT interventions Plan of Care (if not transferred): Pt doesn't seem post-ictal on exam. He is at his baseline neuro status per charting. Perhaps these were tremors? Or chills from his fever? Dr. Christel Mormon assessed pt PTA RRT and would like to treat fever and continue to monitor pt. Please call RRT if further assistance needed. Event Summary: Name of Physician Notified: Dr.Cresenzo at (PTA RRT)    at    Outcome: Stayed in room and stabalized  Event End Time: 1930  Dillard Essex

## 2019-05-18 NOTE — Progress Notes (Signed)
Spoke with Daughter. Gave an update about how tired he was today and that we will continue to monitor and will call with any changes. She said that she was considering coming to visit today but will reconsider for tomorrow if he continues to do ok.

## 2019-05-18 NOTE — Progress Notes (Cosign Needed)
FPTS Interim Progress Note  S: Called to patient room due to nurse Lauren concern of somnolence.  As I enter the room she was giving wound care to sacral wound. Lauren feels as if he was not as responsive as he has been in the past.  This morning in dialysis he was talkative with me and said that he felt fine.  When I got to the room he could barely keep his eyes open beyond me calling his name.  Paged Dr. Sandi Carne to room to assess who was shortly follow up by critical care nurse for additional assessment. Mr Saxer continued to improve while being assessed continue to keep his eyes open through conversation expressing that he was tired and that hemodialysis usually wears him out.  He then started watching TV.  O: BP 111/65   Pulse (!) 111   Temp 98.1 F (36.7 C) (Oral)   Resp 20   Ht 5\' 10"  (1.778 m)   Wt 101.2 kg   SpO2 97%   BMI 32.01 kg/m    General: Appears well, no acute distress. Age appropriate. Lying in bed with slight tremor  Cardiac: RRR, normal heart sounds, no murmurs Respiratory: CTAB, normal effort Extremities: No edema or cyanosis. PVA palpable Skin: Cold and dry, sacral pressure ulcer in various stages of healing Neuro: arousal when name called. Appears lethargic but continued to show improvement during exam. PEERL. Alert to person. Grip strength symmetric bilaterally  A/P: Exhaustion from HD. Vitals and continued alertness reassuring. Will continue to monitor but patient's energy level will be likely low for the rest of the day. CBG 88.  Gerlene Fee, DO 05/18/2019, 2:17 PM PGY-1, [place] Medicine Service pager [phone]

## 2019-05-18 NOTE — Progress Notes (Signed)
Family Medicine Teaching Service Daily Progress Note Intern Pager: [phone]  Patient name: Troy Wells Medical record number: [id number] Date of birth: Dec 31, 1965 Age: 53 y.o. Gender: male  Primary Care Provider: [name], MD Consultants: Neurology, critical care, nephrology Code Status: Full  Pt Overview and Major Events to Date:  04/27/2019 admitted to ICU 05/02/2019 MRI brain showing anoxic brain injury 05/10/2019: Extubated 05/12/2019: Started receiving care under FPTS  Assessment and Plan: Troy Wells is a 53 y.o. male transferred from ICU after cardiac arrest with recent history of COVID-19 infection.  PMH significant for ESRD MWF on HD, OSA, HTN, HLD, HFpEF, pHTN, type 2 diabetes, and diabetic neuropathy.  Encephalopathy  Possible anoxic brain injury  PEA cardiac arrest Continues to improve daily.  Per neurology, continue supportive care and patient will have lengthy recovery.   -Appreciate PT/OT working with patient -Social work to assist with placement, CIR preferred with SNF as backup -Continue cardiac monitoring for now, can likely DC soon -Monitor improvement from neuro standpoint -neurology consulted, appreciated recommendations   COVID (resolved)  Enterobacter HAP  Leukocytosis Patient now greater than 21 days out from Covid diagnosis. Has been cleared by infectious disease. Enterobacter cloacae from tracheal aspirate.  Treated with CTX, per ID no additional treatment needed at this time. Leukocytosis downtrending 14.5 today from 16.2 yesterday.  Afebrile.  T-max 99.4 F.  -Monitor fever curve -Monitor WBC on daily CBC -Consider reculture and chest x-ray if patient develops fever   ESRD on HD  Hypercalcemia  Secondary hyperparathyroidism Dialysis per nephro TTS schedule.  K 4.3, BUN 124, Cr 12.12, Ca 9.8.  Outpatient schedule is TTS. SPEP and kappa/lamdba light chains elevated with normal ratio.  -Nephrology following, appreciate recs: stop  lokelma, phos > 30 will repeat labs -Daily renal function panel -Aranesp every Tuesday with HD  HFmEF  CAD ECHO on 04/27/19 40 to 45%.  LV with mild to moderate decreased fxn, mildly increased LVH, inferiolateral akinesis and anterolateral severe hypokinesis. Grade 2 diastolic dysfunction.  -Continue aspirin 81 -Continue Lipitor 80 mg daily -Coreg 25 mg twice daily -Torsemide 100 mg daily on dialysis days and Sunday.  Hypertension  Tachycardia Normotensive, BP 130/69.  Not tachycardic this morning.  Heart rate range 92-100 yesterday. -Continue Coreg 25 mg twice daily -Continue amlodipine 10 mg daily -Continue to hold home minoxidil 20 mg twice daily  OSA with use of CPAP  OSA at baseline and uses CPAP intermittently.  Now 21+ days out from Covid diagnosis, can use CPAP if needed. -Encourage patient to use CPAP at bedtime -wean off O2  Diabetes Home medications include glipizide 5 mg BID. Last A1c 5.8 on 04/14/2019.  Fasting glucose 115  today.  -sSSI -Monitor CBGs -Continue holding home glipizide  HLD Home medications include atorvastatin 40mg  -Atorvastatin 80mg  daily  Diabetic Neuropathy  Patient on home gabapentin 300 mg TID. Patient reports only taking BID.  -Hold gabapentin 300mg  TID    Gout  Home medications include allopurinol 100mg  daily and colchicine 0.6mg  BID as needed when symptomatic.  -Continue home allopurinol  FEN/GI:  Dysphagia 3 diet PPx: Heparin 5000 units subcu 3 times daily   Disposition: Pending CIR approval, possible SNF if not approved for CIR   Subjective:  Troy Wells no significant overnight events. Was seen today while in dialysis. Appeared sleepy but woke to say he is doing well and does not currently have any concerns.  Objective: Temp:  [97.6 F (36.4 C)-98.3 F (36.8 C)] 97.6 F (36.4 C) (12/01  7096) Pulse Rate:  [73-98] 77 (12/01 0416) Resp:  [18-21] 20 (12/01 0416) BP: (111-119)/(61-80) 119/80 (12/01 0416) SpO2:   [99 %-100 %] 100 % (12/01 0416)  Physical Exam: General: Appears well, no acute distress. Age appropriate. Lying in bed. Cardiac: RRR, normal heart sounds, no murmurs Respiratory: CTAB, normal effort Abdomen: soft, nontender, nondistended Extremities: No edema or cyanosis. PVA palp and moon boots are on Skin: Warm and dry, no rashes noted Neuro: Alert to self and responsive Psych: normal affect   Laboratory: Recent Labs  Lab 05/16/19 0402 05/17/19 0303 05/18/19 0026  WBC 15.0* 16.2* 14.5*  HGB 9.1* 8.3* 8.5*  HCT 28.7* 24.9* 25.7*  PLT 512* 449* 397   Recent Labs  Lab 05/13/19 0618  05/16/19 0402 05/17/19 0303 05/18/19 0026  NA 136   < > 139 136 135  K 6.1*   < > 4.3 4.2 4.3  CL 100   < > 93* 91* 90*  CO2 15*   < > 24 23 22   BUN 121*   < > 65* 98* 124*  CREATININE 9.99*   < > 6.80* 9.85* 12.12*  CALCIUM 10.8*   < > 10.6* 9.9 9.8  PROT 8.3*  --   --   --   --   BILITOT 0.8  --   --   --   --   ALKPHOS 98  --   --   --   --   ALT 44  --   --   --   --   AST 19  --   --   --   --   GLUCOSE 132*   < > 111* 112* 121*   < > = values in this interval not displayed.    Imaging/Diagnostic Tests: No new imaging   Gerlene Fee, DO 05/18/2019, 8:20 AM PGY-1, [place] Intern pager: [phone], text pages welcome

## 2019-05-18 NOTE — Progress Notes (Signed)
Was paged to the patient's room by the patient's nurse who reported seizure-like activity.  Nurse reports it started as a tremor but different than his normal tremors.  She says that it lasted for approximately 1 minute.  When I entered the room the patient was sitting up in bed watching TV.  He would intermittently closes eyes but was responsive and responded appropriately to questions.  He was also able to move his extremities upon command.  He reports he did not lose consciousness and remembers all of the events.  He says that he does notice he begins to shake when he leans all the way back.  The patient does have a rectal temp of 100.4.  His blood pressure were in the low 100s/70s.  His pulse was in the low 100s.  His oxygen saturation was 100% on room air.  I do not believe this was a seizure given the patient reports he did not lose consciousness and there was no apparent postictal state.  I told the nurse it was okay for her to give him a dose of Tylenol and that I would discuss the situation with the team.  At this time we are going to monitor for further fevers.  If he does spike a fever overnight we plan to start the sepsis work-up including blood cultures, chest x-ray, urine culture.

## 2019-05-18 NOTE — Procedures (Signed)
I was present at this dialysis session. I have reviewed the session itself and made appropriate changes.   K 4.3, stop lokelma. P > 30, this is like incorrect, will repeat.  Likely to SNF, CIR note reviewd.  UF goal 2.5L.  Next HD 12/3.   Filed Weights   05/15/19 1630 05/15/19 1933 05/17/19 0457  Weight: 99.6 kg 99.3 kg 102.3 kg    Recent Labs  Lab 05/18/19 0026  NA 135  K 4.3  CL 90*  CO2 22  GLUCOSE 121*  BUN 124*  CREATININE 12.12*  CALCIUM 9.8  PHOS >30.0*    Recent Labs  Lab 05/12/19 0349  05/16/19 0402 05/17/19 0303 05/18/19 0026  WBC 25.3*   < > 15.0* 16.2* 14.5*  NEUTROABS 18.1*  --   --   --   --   HGB 9.0*   < > 9.1* 8.3* 8.5*  HCT 27.7*   < > 28.7* 24.9* 25.7*  MCV 87.1   < > 88.9 84.1 85.1  PLT 687*   < > 512* 449* 397   < > = values in this interval not displayed.    Scheduled Meds: . allopurinol  100 mg Oral Daily  . aspirin  81 mg Oral Daily  . atorvastatin  80 mg Oral Daily  . carvedilol  25 mg Oral BID WC  . Chlorhexidine Gluconate Cloth  6 each Topical Q0600  . collagenase   Topical Daily  . darbepoetin (ARANESP) injection - DIALYSIS  200 mcg Intravenous Q Tue-HD  . feeding supplement (NEPRO CARB STEADY)  237 mL Oral BID BM  . heparin injection (subcutaneous)  5,000 Units Subcutaneous Q8H  . insulin aspart  0-9 Units Subcutaneous TID WC  . multivitamin  1 tablet Oral QHS  . sevelamer carbonate  2.4 g Oral TID WC  . sodium zirconium cyclosilicate  10 g Oral Daily  . torsemide  100 mg Oral Once per day on Sun Mon Wed Fri   Continuous Infusions: PRN Meds:.acetaminophen, heparin   Pearson Grippe  MD 05/18/2019, 8:33 AM

## 2019-05-19 ENCOUNTER — Inpatient Hospital Stay (HOSPITAL_COMMUNITY): Payer: Medicare Other

## 2019-05-19 DIAGNOSIS — R509 Fever, unspecified: Secondary | ICD-10-CM

## 2019-05-19 DIAGNOSIS — W19XXXS Unspecified fall, sequela: Secondary | ICD-10-CM

## 2019-05-19 LAB — BLOOD GAS, ARTERIAL
Acid-base deficit: 0.5 mmol/L (ref 0.0–2.0)
Bicarbonate: 23.8 mmol/L (ref 20.0–28.0)
Drawn by: 55062
FIO2: 21
O2 Saturation: 82.5 %
Patient temperature: 37.1
pCO2 arterial: 40.2 mmHg (ref 32.0–48.0)
pH, Arterial: 7.389 (ref 7.350–7.450)
pO2, Arterial: 54.3 mmHg — ABNORMAL LOW (ref 83.0–108.0)

## 2019-05-19 LAB — RENAL FUNCTION PANEL
Albumin: 2.4 g/dL — ABNORMAL LOW (ref 3.5–5.0)
Anion gap: 20 — ABNORMAL HIGH (ref 5–15)
BUN: 74 mg/dL — ABNORMAL HIGH (ref 6–20)
CO2: 23 mmol/L (ref 22–32)
Calcium: 10.1 mg/dL (ref 8.9–10.3)
Chloride: 90 mmol/L — ABNORMAL LOW (ref 98–111)
Creatinine, Ser: 8.06 mg/dL — ABNORMAL HIGH (ref 0.61–1.24)
GFR calc Af Amer: 8 mL/min — ABNORMAL LOW (ref >60)
GFR calc non Af Amer: 7 mL/min — ABNORMAL LOW (ref >60)
Glucose, Bld: 120 mg/dL — ABNORMAL HIGH (ref 70–99)
Phosphorus: 10.3 mg/dL — ABNORMAL HIGH (ref 2.5–4.6)
Potassium: 4 mmol/L (ref 3.5–5.1)
Sodium: 133 mmol/L — ABNORMAL LOW (ref 135–145)

## 2019-05-19 LAB — GLUCOSE, CAPILLARY
Glucose-Capillary: 105 mg/dL — ABNORMAL HIGH (ref 70–99)
Glucose-Capillary: 189 mg/dL — ABNORMAL HIGH (ref 70–99)
Glucose-Capillary: 101 mg/dL — ABNORMAL HIGH (ref 70–99)

## 2019-05-19 LAB — CBC WITH DIFFERENTIAL/PLATELET
Abs Immature Granulocytes: 0.14 10*3/uL — ABNORMAL HIGH (ref 0.00–0.07)
Basophils Absolute: 0 10*3/uL (ref 0.0–0.1)
Basophils Relative: 0 %
Eosinophils Absolute: 0.1 10*3/uL (ref 0.0–0.5)
Eosinophils Relative: 1 %
HCT: 24.9 % — ABNORMAL LOW (ref 39.0–52.0)
Hemoglobin: 8.2 g/dL — ABNORMAL LOW (ref 13.0–17.0)
Immature Granulocytes: 1 %
Lymphocytes Relative: 7 %
Lymphs Abs: 1.3 10*3/uL (ref 0.7–4.0)
MCH: 27.7 pg (ref 26.0–34.0)
MCHC: 32.9 g/dL (ref 30.0–36.0)
MCV: 84.1 fL (ref 80.0–100.0)
Monocytes Absolute: 1.5 10*3/uL — ABNORMAL HIGH (ref 0.1–1.0)
Monocytes Relative: 9 %
Neutro Abs: 14.4 10*3/uL — ABNORMAL HIGH (ref 1.7–7.7)
Neutrophils Relative %: 82 %
Platelets: 366 10*3/uL (ref 150–400)
RBC: 2.96 MIL/uL — ABNORMAL LOW (ref 4.22–5.81)
RDW: 16.7 % — ABNORMAL HIGH (ref 11.5–15.5)
WBC: 17.5 10*3/uL — ABNORMAL HIGH (ref 4.0–10.5)
nRBC: 0 % (ref 0.0–0.2)

## 2019-05-19 LAB — COMPREHENSIVE METABOLIC PANEL
ALT: 28 U/L (ref 0–44)
AST: 22 U/L (ref 15–41)
Albumin: 2.5 g/dL — ABNORMAL LOW (ref 3.5–5.0)
Alkaline Phosphatase: 77 U/L (ref 38–126)
Anion gap: 25 — ABNORMAL HIGH (ref 5–15)
BUN: 89 mg/dL — ABNORMAL HIGH (ref 6–20)
CO2: 21 mmol/L — ABNORMAL LOW (ref 22–32)
Calcium: 10.1 mg/dL (ref 8.9–10.3)
Chloride: 89 mmol/L — ABNORMAL LOW (ref 98–111)
Creatinine, Ser: 9.92 mg/dL — ABNORMAL HIGH (ref 0.61–1.24)
GFR calc Af Amer: 6 mL/min — ABNORMAL LOW (ref >60)
GFR calc non Af Amer: 5 mL/min — ABNORMAL LOW (ref >60)
Glucose, Bld: 152 mg/dL — ABNORMAL HIGH (ref 70–99)
Potassium: 4.4 mmol/L (ref 3.5–5.1)
Sodium: 135 mmol/L (ref 135–145)
Total Bilirubin: 0.6 mg/dL (ref 0.3–1.2)
Total Protein: 6.8 g/dL (ref 6.5–8.1)

## 2019-05-19 LAB — CBC
HCT: 26.4 % — ABNORMAL LOW (ref 39.0–52.0)
Hemoglobin: 8.8 g/dL — ABNORMAL LOW (ref 13.0–17.0)
MCH: 28.5 pg (ref 26.0–34.0)
MCHC: 33.3 g/dL (ref 30.0–36.0)
MCV: 85.4 fL (ref 80.0–100.0)
Platelets: 394 10*3/uL (ref 150–400)
RBC: 3.09 MIL/uL — ABNORMAL LOW (ref 4.22–5.81)
RDW: 16.9 % — ABNORMAL HIGH (ref 11.5–15.5)
WBC: 17.1 10*3/uL — ABNORMAL HIGH (ref 4.0–10.5)
nRBC: 0.1 % (ref 0.0–0.2)

## 2019-05-19 LAB — MAGNESIUM: Magnesium: 2.3 mg/dL (ref 1.7–2.4)

## 2019-05-19 LAB — CK: Total CK: 39 U/L — ABNORMAL LOW (ref 49–397)

## 2019-05-19 MED ORDER — CARVEDILOL 12.5 MG PO TABS
12.5000 mg | ORAL_TABLET | Freq: Two times a day (BID) | ORAL | Status: DC
  Administered 2019-05-19 – 2019-06-02 (×20): 12.5 mg via ORAL
  Filled 2019-05-19 (×22): qty 1

## 2019-05-19 MED ORDER — LORAZEPAM 2 MG/ML IJ SOLN
4.0000 mg | INTRAMUSCULAR | Status: AC
  Filled 2019-05-19: qty 2

## 2019-05-19 MED ORDER — SODIUM CHLORIDE 0.9 % IV SOLN
75.0000 mL/h | INTRAVENOUS | Status: DC
  Administered 2019-05-19: 75 mL/h via INTRAVENOUS

## 2019-05-19 NOTE — Progress Notes (Signed)
Patient was found lying over left bed side rail. His head was positioned on the ground & body was hyperextended on top of the bed. He does not remember how he got in that position & does not remember the fall at all. He was properly lifted back into the bed. Vitals and assessment were taken. Rapid Response and MD were notified. They agreed that testing does not need to be performed at this due to patient not being in pain. Patient's daughter was notified and RN facetimed her per request.     05/18/19 2100  What Happened  Was fall witnessed? No  Patients activity before fall to/from bed, chair, or stretcher;other (comment) (pt remains in bed)  Point of contact head  Was patient injured? No  Patient found on floor;other (Comment) (head was on floor, body was upper extended in bed)  Found by Staff-comment Oneita Kras)  Stated prior activity other (comment) (patient remains in bed)  Follow Up  MD notified Guadalupe Dawn, MD  Time MD notified 2119  Family notified Yes - comment Raji Glinski (daughter))  Time family notified 2138  Additional tests No  Simple treatment Other (comment) (Placed rolled pillows under neck & back)  Progress note created (see row info) Yes  Adult Fall Risk Assessment  Risk Factor Category (scoring not indicated) High fall risk per protocol (document High fall risk)  Age 53  Fall History: Fall within 6 months prior to admission 0  Elimination; Bowel and/or Urine Incontinence 2  Elimination; Bowel and/or Urine Urgency/Frequency 0  Medications: includes PCA/Opiates, Anti-convulsants, Anti-hypertensives, Diuretics, Hypnotics, Laxatives, Sedatives, and Psychotropics 3  Patient Care Equipment 2  Mobility-Assistance 2  Mobility-Gait 2  Mobility-Sensory Deficit 0  Altered awareness of immediate physical environment 1  Impulsiveness 0  Lack of understanding of one's physical/cognitive limitations 4  Total Score 16  Patient Fall Risk Level High fall risk   Adult Fall Risk Interventions  Required Bundle Interventions *See Row Information* High fall risk - low, moderate, and high requirements implemented  Additional Interventions Use of appropriate toileting equipment (bedpan, BSC, etc.);PT/OT need assessed if change in mobility from baseline;Reorient/diversional activities with confused patients  Screening for Fall Injury Risk (To be completed on HIGH fall risk patients) - Assessing Need for Low Bed  Risk For Fall Injury- Low Bed Criteria None identified - Continue screening  Screening for Fall Injury Risk (To be completed on HIGH fall risk patients who do not meet crieteria for Low Bed) - Assessing Need for Floor Mats Only  Risk For Fall Injury- Criteria for Floor Mats Confusion/dementia (+NuDESC, CIWA, TBI, etc.)  Will Implement Floor Mats Yes  Vitals  BP 104/83  MAP (mmHg) 89  BP Location Right Arm  BP Method Automatic  Patient Position (if appropriate) Lying  Pulse Rate (!) 108  Pulse Rate Source Monitor  ECG Heart Rate (!) 107  Resp 19  Oxygen Therapy  SpO2 97 %  Pain Assessment  Pain Scale 0-10  Pain Score 3  Faces Pain Scale 2  Pain Location Back  Pain Orientation Left  Pain Intervention(s) Repositioned

## 2019-05-19 NOTE — Progress Notes (Signed)
FPTS Interim Progress Note  S: Lying in bed says he is feeling fine.  O: BP 105/62 (BP Location: Right Arm)   Pulse 87   Temp 99.5 F (37.5 C) (Rectal)   Resp 18   Ht 5\' 10"  (1.778 m)   Wt 101.2 kg   SpO2 100%   BMI 32.01 kg/m   General: Appears well, no acute distress. Age appropriate. Lying in bed with eye close Cardiac: RRR, normal heart sounds, no murmurs Respiratory: inspiratory/expiratory phase not distinguishable due to body position, normal effort Extremities: No edema or cyanosis. Skin: Warm and dry, shoulder ulcer with some blood on gauze covering wound elevated off bed Neuro: alert and oriented, no focal deficits Psych: normal affect  PORTABLE CHEST 1 VIEW COMPARISON:  05/13/2019 FINDINGS: Cardiac enlargement. Asymmetric opacity within the left lower lobe is identified. The left upper lobe and right lung are clear. No evidence for pleural effusion or edema. IMPRESSION: 1. Left lower lobe airspace opacity compatible with pneumonia. 2. Cardiac enlargement without heart failure.  A/P: Possible Pneumonia on imaging. Patient is not in acute distress and without current increase work of breathing. Vitals signs are unremarkable. -continue to monitor status -consider starting antibiotics if febrile  Autry-Lott, Naaman Plummer, DO 05/19/2019, 5:59 PM PGY-1, [place] Medicine Service pager [phone]

## 2019-05-19 NOTE — Progress Notes (Signed)
Family Medicine Teaching Service Daily Progress Note Intern Pager: [phone]  Patient name: Troy Wells Medical record number: [id number] Date of birth: [birth year] Age: 53 y.o. Gender: male  Primary Care Provider: [name], MD Consultants: Neurology, critical care, nephrology Code Status: Full  Pt Overview and Major Events to Date:  04/27/2019 admitted to ICU 05/02/2019 MRI brain showing anoxic brain injury 05/10/2019: Extubated 05/12/2019: Started receiving care under FPTS  Assessment and Plan: Troy Wells is a 53 y.o. male transferred from ICU after cardiac arrest with recent history of COVID-19 infection.  PMH significant for ESRD MWF on HD, OSA, HTN, HLD, HFpEF, pHTN, type 2 diabetes, and diabetic neuropathy.  Encephalopathy  Possible anoxic brain injury  PEA cardiac arrest Continues to improve daily.  Per neurology, continue supportive care and patient will have lengthy recovery. Overnight he was found dangling head first out of his bed. It was unclear whether he hit his head or not as it was unwitnessed. He does not endorse headache or injury at this time. -CT head w/o contrast -Appreciate PT/OT working with patient -Social work to assist with placement, CIR preferred with SNF as backup -Continue cardiac monitoring for now, can likely DC soon -Monitor improvement from neuro standpoint -neurology consulted, appreciated recommendations   COVID (resolved)  Enterobacter HAP  Leukocytosis Patient now greater than 21 days out from Covid diagnosis. Has been cleared by infectious disease. Enterobacter cloacae from tracheal aspirate.  Treated with CTX, per ID no additional treatment needed at this time. Leukocytosis 17.4 yesterday.  T-max overnight reported to be 100.88F by one nurse and 99.9 F by another. Considered possibility of source to be pressure ulcers on shoulder and sacral region. Sacral region ulcer was visualized yesterday and was found to be non-infectious  appearing. -Retrieve blood cultures -Obtain CXR  -Monitor fever curve -Monitor WBC on daily CBC -Consider reculture and chest x-ray if patient develops fever   ESRD on HD  Hypercalcemia  Secondary hyperparathyroidism Dialysis per nephro TTS schedule.  K 4.0, BUN 74, Cr 8.06. Outpatient schedule is TTS. SPEP and kappa/lamdba light chains elevated with normal ratio.  -Nephrology following, appreciate recs: stop lokelma, phos > 30 will repeat labs -Daily renal function panel -Aranesp every Tuesday with HD  HFmEF  CAD ECHO on 04/27/19 40 to 45%.  LV with mild to moderate decreased fxn, mildly increased LVH, inferiolateral akinesis and anterolateral severe hypokinesis. Grade 2 diastolic dysfunction.  -Continue aspirin 81 -Continue Lipitor 80 mg daily -Coreg 25 mg twice daily -Torsemide 100 mg daily on dialysis days and Sunday.  Hypertension  Tachycardia Normotensive, BP 105/64.  Not tachycardic this morning.  Heart rate range 92-100 yesterday. -Continue Coreg 25 mg twice daily -Continue amlodipine 10 mg daily -Continue to hold home minoxidil 20 mg twice daily  OSA with use of CPAP  OSA at baseline and uses CPAP intermittently.  Now 21+ days out from Covid diagnosis, can use CPAP if needed. -Encourage patient to use CPAP at bedtime -wean off O2 during the day  Diabetes Home medications include glipizide 5 mg BID. Last A1c 5.8 on 04/14/2019.  Fasting glucose 124-166 today.  -sSSI -Monitor CBGs -Continue holding home glipizide  HLD Home medications include atorvastatin 40mg  -Atorvastatin 80mg  daily  Diabetic Neuropathy  Patient on home gabapentin 300 mg TID. Patient reports only taking BID.  -Hold gabapentin 300mg  TID    Gout  Home medications include allopurinol 100mg  daily and colchicine 0.6mg  BID as needed when symptomatic.  -Continue home allopurinol  FEN/GI:  Dysphagia  3 diet PPx: Heparin 5000 units subcu 3 times daily   Disposition: Pending CIR approval,  possible SNF if not approved for CIR   Subjective:  Overnight was found dangling on the side of his bed head down which was unwitnessed and unsure if he hit his head.  Patient does not endorse headache or injury to head.  He says he was trying to get out of bed because he was tired of laying in the bed.  He says that he feels better than yesterday as he was really tired yesterday after hemodialysis.  Objective: Temp:  [97.9 F (36.6 C)-100.4 F (38 C)] 99.9 F (37.7 C) (12/02 0200) Pulse Rate:  [76-114] 87 (12/02 0600) Resp:  [12-37] 20 (12/02 0600) BP: (89-148)/(38-85) 105/64 (12/02 0600) SpO2:  [95 %-100 %] 97 % (12/02 0600) Weight:  [101.2 kg-103.2 kg] 101.2 kg (12/01 1200)  Physical Exam:  General: Appears well, no acute distress. Age appropriate. Lying in bed watching tv. Cardiac: RRR, normal heart sounds, no murmurs Respiratory: CTAB, normal effort Abdomen: soft, nontender, nondistended Extremities: No edema or cyanosis.Unna boots on LE bilaterally Skin: Warm and dry, no rashes or bruises noted Neuro: alert and oriented Psych: normal affect    Laboratory: Recent Labs  Lab 05/17/19 0303 05/18/19 0026 05/18/19 0900  WBC 16.2* 14.5* 17.2*  HGB 8.3* 8.5* 9.5*  HCT 24.9* 25.7* 27.9*  PLT 449* 397 433*   Recent Labs  Lab 05/13/19 0618  05/16/19 0402 05/17/19 0303 05/18/19 0026  NA 136   < > 139 136 135  K 6.1*   < > 4.3 4.2 4.3  CL 100   < > 93* 91* 90*  CO2 15*   < > 24 23 22   BUN 121*   < > 65* 98* 124*  CREATININE 9.99*   < > 6.80* 9.85* 12.12*  CALCIUM 10.8*   < > 10.6* 9.9 9.8  PROT 8.3*  --   --   --   --   BILITOT 0.8  --   --   --   --   ALKPHOS 98  --   --   --   --   ALT 44  --   --   --   --   AST 19  --   --   --   --   GLUCOSE 132*   < > 111* 112* 121*   < > = values in this interval not displayed.    Imaging/Diagnostic Tests: No new imaging   Gerlene Fee, DO 05/19/2019, 6:57 AM PGY-1, [place] Intern  pager: [phone], text pages welcome

## 2019-05-19 NOTE — Progress Notes (Signed)
Occupational Therapy Treatment Patient Details Name: Troy Wells MRN: [id number] DOB: 07/05/1965 Today's Date: 05/19/2019    History of present illness 53 yo male presenting to [facility] with cardiac arrest and CPR initatied by EMS. Pt at dialysis, complained of SOB, and became unresponsive. Pt with prior hospital admission (10/27 - 11/3) for COVID associated pneumonia; active problems of acute hypoxemic respiratory failure NSTEMI during admission. Intubated 11/10-11/23. MRI showing bilateral medial thalami is suspicious for anoxic injury and chronic R PICA cerebellar infarct.  Transitioned off of CRRT 05/11/19, femoral line removed 05/13/19.PMH including ESRD on HD, chronic anemia, chronic systolic heart failure, CAD, HTN, OSA on CPAP, DM2, and HLD.   OT comments  Pt received asleep in bed, woke up pleasantly to therapist's voice. Pt required maxA for simulated feeding and maxA for grooming bed level. Pt follows simple commands <10% of the time. He performed rolling R<>L in bed with maxA to initiate roll, modA to progress roll. Pt will continue to benefit from skilled OT services to maximize safety and independence with ADL/IADL and functional mobility. Will continue to follow acutely and progress as tolerated.    Follow Up Recommendations  CIR;Supervision/Assistance - 24 hour    Equipment Recommendations  Other (comment)(defer to next venue)    Recommendations for Other Services      Precautions / Restrictions Precautions Precautions: Fall;Other (comment) Precaution Comments: very apraxic, almost no initiation Restrictions Weight Bearing Restrictions: No       Mobility Bed Mobility Overal bed mobility: Needs Assistance Bed Mobility: Rolling Rolling: Mod assist;Max assist   Supine to sit: Max assist;+2 for physical assistance;HOB elevated Sit to supine: Max assist;+2 for physical assistance;HOB elevated   General bed mobility comments: bed level session for pt/therapist safety    Transfers                 General transfer comment: deferred, fatigue    Balance Overall balance assessment: Needs assistance Sitting-balance support: Bilateral upper extremity supported;Feet supported Sitting balance-Leahy Scale: Poor Sitting balance - Comments: MaxA for balance today       Standing balance comment: NT                           ADL either performed or assessed with clinical judgement   ADL Overall ADL's : Needs assistance/impaired Eating/Feeding: Maximal assistance;Sitting;Bed level Eating/Feeding Details (indicate cue type and reason): simulated;pt required hand over hand to bring hand to mouth Grooming: Maximal assistance;Sitting;Bed level Grooming Details (indicate cue type and reason): maxA for bilateral coordination;maintain grasp and bring item to mouth             Lower Body Dressing: Total assistance;Bed level   Toilet Transfer: Moderate assistance;Minimal assistance Toilet Transfer Details (indicate cue type and reason): simulated rolling in bed R<>L,modA to initiate and for thorough roll Toileting- Clothing Manipulation and Hygiene: Total assistance         General ADL Comments: maxA-totalA for ADL due to weakness and cognition     Vision   Vision Assessment?: Vision impaired- to be further tested in functional context Additional Comments: preference for left head turn and gaze;able to look left with cues;maintained eyes shut majority of session   Perception     Praxis      Cognition Arousal/Alertness: Lethargic Behavior During Therapy: Flat affect Overall Cognitive Status: Impaired/Different from baseline Area of Impairment: Orientation;Attention;Memory;Following commands;Safety/judgement;Awareness;Problem solving  Orientation Level: Disoriented to;Person;Place;Time Current Attention Level: Sustained Memory: Decreased short-term memory Following Commands: Follows one step commands  inconsistently;Follows one step commands with increased time Safety/Judgement: Decreased awareness of deficits;Decreased awareness of safety Awareness: Intellectual Problem Solving: Difficulty sequencing;Requires verbal cues;Requires tactile cues;Slow processing;Decreased initiation General Comments: pt unable to state his name this session;required increased time to process simple commands (about 1 min);required multimodal cues for sequencing mobility         Exercises Exercises: General Upper Extremity General Exercises - Upper Extremity Shoulder Flexion: PROM;AAROM;Both;10 reps;Seated(bed level) Shoulder Extension: PROM;AAROM;Both;10 reps;Seated(bed level) Shoulder Horizontal ABduction: AAROM;10 reps;Both;Supine Shoulder Horizontal ADduction: AAROM;10 reps;Both;Supine Elbow Flexion: AAROM;10 reps;Both;Seated(bed level) Elbow Extension: AAROM;10 reps;Both;Seated(bed level)   Shoulder Instructions       General Comments unable to progress to EOB with +1, requires +2;vss throughout    Pertinent Vitals/ Pain       Pain Assessment: Faces Faces Pain Scale: Hurts even more Pain Location: general discomfort with mobility Pain Descriptors / Indicators: Grimacing;Guarding;Moaning Pain Intervention(s): Limited activity within patient's tolerance;Monitored during session;Repositioned  Home Living                                          Prior Functioning/Environment              Frequency  Min 2X/week        Progress Toward Goals  OT Goals(current goals can now be found in the care plan section)  Progress towards OT goals: Progressing toward goals  Acute Rehab OT Goals Patient Stated Goal: none stated OT Goal Formulation: With patient Time For Goal Achievement: 05/31/19 Potential to Achieve Goals: Good ADL Goals Pt Will Perform Grooming: with min assist;sitting;bed level Pt Will Transfer to Toilet: squat pivot transfer;bedside commode;with mod  assist;with +2 assist Pt Will Perform Toileting - Clothing Manipulation and hygiene: with mod assist;sitting/lateral leans;sit to/from stand Pt/caregiver will Perform Home Exercise Program: Increased strength;Both right and left upper extremity;With minimal assist Additional ADL Goal #1: Pt will perform bed mobility with Mod A +2 in preparation for ADLs Additional ADL Goal #2: Pt will tolerate sitting at EOB for ~ 34minutes with Mod A in preparation for ADLs  Plan Discharge plan remains appropriate    Co-evaluation                 AM-PAC OT "6 Clicks" Daily Activity     Outcome Measure   Help from another person eating meals?: Total Help from another person taking care of personal grooming?: A Lot Help from another person toileting, which includes using toliet, bedpan, or urinal?: Total Help from another person bathing (including washing, rinsing, drying)?: A Lot Help from another person to put on and taking off regular upper body clothing?: A Lot Help from another person to put on and taking off regular lower body clothing?: Total 6 Click Score: 9    End of Session Equipment Utilized During Treatment: Oxygen  OT Visit Diagnosis: Unsteadiness on feet (R26.81);Other abnormalities of gait and mobility (R26.89);Muscle weakness (generalized) (M62.81)   Activity Tolerance Patient limited by lethargy;Patient limited by fatigue   Patient Left in bed;with call bell/phone within reach;with bed alarm set(sidelying)   Nurse Communication Mobility status        Time: 2426-8341 OT Time Calculation (min): 19 min  Charges: OT General Charges $OT Visit: 1 Visit OT Treatments $Self Care/Home Management : 8-22 mins  Helene Kelp  [facility] OTR/L Acute Rehabilitation Services Office: [place] 05/19/2019, 4:18 PM

## 2019-05-19 NOTE — Progress Notes (Addendum)
Family medicine progress note  Called to patient room as Dr. Caron Presume and overnight nurse endorsed generalized shaking with extremities contracted and drawn in, with fixed lateral gaze both eyes. This happened for roughly 1 minute when the shaking abruptly stopped and the patient became unresponsive and developed a deep snore.  Initially patient unresponsive to even sternal rub but gradually improved.  As patient had stopped actively seizing no medications given at that time.  Leaving room patient GCS 12 (E3, V4, and 5) patient was slowly waking up and responding to some commands.  Patient did have continued lipsmacking after his extremity jerking had stopped.  Will get stat CBC, CMP, magnesium, CK, VBG, EEG.  Patient already had CT head scheduled will make this stat.  We will discuss case with neurology given that this is a new onset seizure and patient has no prior history of seizure disorder.  Could consider starting broad-spectrum antibiotics, especially given chest x-ray findings.  Will hold off for now but have low threshold overnight.  Exam GEN: Very sleepy but arousable, GCS 12 as above Cardio: Regular rate, rhythm.  Extremities warm, radial pulse intact bilaterally Pulmonary: Transmitted breath sounds secondary to patient snoring Neuro: Able to move all extremities.  GCS 12.  Pupils PERRL A.  Guadalupe Dawn MD PGY-3 Family Medicine Resident

## 2019-05-19 NOTE — Progress Notes (Signed)
Physical Therapy Treatment Patient Details Name: Troy Wells MRN: [id number] DOB: 1965/06/29 Today's Date: 05/19/2019    History of Present Illness 53 yo male presenting to [facility] with cardiac arrest and CPR initatied by EMS. Pt at dialysis, complained of SOB, and became unresponsive. Pt with prior hospital admission (10/27 - 11/3) for COVID associated pneumonia; active problems of acute hypoxemic respiratory failure NSTEMI during admission. Intubated 11/10-11/23. MRI showing bilateral medial thalami is suspicious for anoxic injury and chronic R PICA cerebellar infarct.  Transitioned off of CRRT 05/11/19, femoral line removed 05/13/19.PMH including ESRD on HD, chronic anemia, chronic systolic heart failure, CAD, HTN, OSA on CPAP, DM2, and HLD.    PT Comments    Due to eventful night, discussed case with attending MD, who cleared patient for participation in PT today. Patient received in bed, more lethargic than usual today; daughter present and observed session. Continues to remain quite confused at A&Ox1, closing eyes and taking longer to respond than in previous sessions today. Performed bed mobility with MaxAx2, seemed surprised by transfer. Able to maintain sitting at EOB with Mod-MaxA today, continues to be limited by lethargy and just did not seem to be tolerating movement well as he closed his eyes and interacted with therapist less than usual today at EOB. Sat for approximately 5 minutes and then assist for balance increased. He was returned to supine with MaxAx2 and repositioned to comfort, left in bed with bed alarm active and daughter present. It seems per chart review that patient is not eligible for CIR- updated recommendation to SNF moving forward.     Follow Up Recommendations  SNF;Supervision/Assistance - 24 hour     Equipment Recommendations  Hospital bed;Other (comment);Rolling walker with 5" wheels;3in1 (PT)(hoyer lift)    Recommendations for Other Services        Precautions / Restrictions Precautions Precautions: Fall;Other (comment) Precaution Comments: very apraxic, almost no initiation Restrictions Weight Bearing Restrictions: No    Mobility  Bed Mobility Overal bed mobility: Needs Assistance Bed Mobility: Supine to Sit;Sit to Supine     Supine to sit: Max assist;+2 for physical assistance;HOB elevated Sit to supine: Max assist;+2 for physical assistance;HOB elevated   General bed mobility comments: MaxAx2 to pivot to EOB, patient grimacing and guarding during transition. Poor tolerance to sitting today, only able to maintain approximately 5 minutes  Transfers                 General transfer comment: deferred, fatigue  Ambulation/Gait             General Gait Details: deferred, fatigue   Stairs             Wheelchair Mobility    Modified Rankin (Stroke Patients Only)       Balance Overall balance assessment: Needs assistance Sitting-balance support: Bilateral upper extremity supported;Feet supported Sitting balance-Leahy Scale: Poor Sitting balance - Comments: MaxA for balance today       Standing balance comment: NT                            Cognition Arousal/Alertness: Lethargic Behavior During Therapy: Flat affect Overall Cognitive Status: Impaired/Different from baseline Area of Impairment: Orientation;Attention;Memory;Following commands;Safety/judgement;Awareness;Problem solving                 Orientation Level: Disoriented to;Place;Time;Situation Current Attention Level: Sustained Memory: Decreased short-term memory Following Commands: Follows one step commands inconsistently;Follows one step commands with increased time Safety/Judgement: Decreased awareness  of deficits;Decreased awareness of safety Awareness: Intellectual Problem Solving: Difficulty sequencing;Requires verbal cues;Requires tactile cues;Slow processing;Decreased initiation General Comments: max  VC/TC, extended time      Exercises      General Comments        Pertinent Vitals/Pain Pain Assessment: Faces Faces Pain Scale: Hurts little more Pain Location: general discomfort with mobility Pain Descriptors / Indicators: Grimacing;Guarding;Moaning Pain Intervention(s): Limited activity within patient's tolerance;Monitored during session    Home Living                      Prior Function            PT Goals (current goals can now be found in the care plan section) Acute Rehab PT Goals Patient Stated Goal: none stated PT Goal Formulation: With patient Time For Goal Achievement: 05/29/19 Potential to Achieve Goals: Good Progress towards PT goals: Not progressing toward goals - comment(limited by cognition and lethargy today)    Frequency    Min 2X/week      PT Plan Discharge plan needs to be updated;Frequency needs to be updated    Co-evaluation              AM-PAC PT "6 Clicks" Mobility   Outcome Measure  Help needed turning from your back to your side while in a flat bed without using bedrails?: A Lot Help needed moving from lying on your back to sitting on the side of a flat bed without using bedrails?: A Lot Help needed moving to and from a bed to a chair (including a wheelchair)?: Total Help needed standing up from a chair using your arms (e.g., wheelchair or bedside chair)?: Total Help needed to walk in hospital room?: Total Help needed climbing 3-5 steps with a railing? : Total 6 Click Score: 8    End of Session   Activity Tolerance: Patient limited by fatigue Patient left: in bed;with call bell/phone within reach;with bed alarm set;with family/visitor present   PT Visit Diagnosis: Other abnormalities of gait and mobility (R26.89);Muscle weakness (generalized) (M62.81);Difficulty in walking, not elsewhere classified (R26.2);Pain Pain - part of body: (sacrum)     Time: 6015-6153 PT Time Calculation (min) (ACUTE ONLY): 21  min  Charges:  $Therapeutic Activity: 8-22 mins                     Windell Norfolk, DPT, PN1   Supplemental Physical Therapist [place]    Pager [phone] Acute Rehab Office [phone]

## 2019-05-19 NOTE — Progress Notes (Signed)
EEG complete - results pending 

## 2019-05-19 NOTE — Progress Notes (Signed)
[facility] Progress Note   Subjective:  Seen in room - resting this morning. Per notes, had eventful night including eval for tremor and for fall out of bed. Denies CP or dyspnea today - seems back to prior MS. Using C-pap at moment. Discharge plan remains unclear - rehab of some kind, ?CIR.  Objective Vitals:   05/19/19 0600 05/19/19 0700 05/19/19 0800 05/19/19 0900  BP: 105/64 109/62 120/66 113/63  Pulse: 87     Resp: 20 19 20 18   Temp:      TempSrc:      SpO2: 97%     Weight:      Height:       Physical Exam General: Frail appearing man, NAD. Using c-pap at time of my visit Heart: RRR; no murmur Lungs: CTA anteriorly Abdomen: soft, non-tender Extremities: No LE edema Dialysis Access: AVF + thrill  Additional Objective Labs: Basic Metabolic Panel: Recent Labs  Lab 05/16/19 0402 05/17/19 0303 05/18/19 0026 05/18/19 0900  NA 139 136 135  --   K 4.3 4.2 4.3  --   CL 93* 91* 90*  --   CO2 24 23 22   --   GLUCOSE 111* 112* 121*  --   BUN 65* 98* 124*  --   CREATININE 6.80* 9.85* 12.12*  --   CALCIUM 10.6* 9.9 9.8  --   PHOS 9.5* 11.9* >30.0* 5.8*   Liver Function Tests: Recent Labs  Lab 05/13/19 0618  05/16/19 0402 05/17/19 0303 05/18/19 0026  AST 19  --   --   --   --   ALT 44  --   --   --   --   ALKPHOS 98  --   --   --   --   BILITOT 0.8  --   --   --   --   PROT 8.3*  --   --   --   --   ALBUMIN 3.1*   < > 2.8* 2.5* 2.5*   < > = values in this interval not displayed.   CBC: Recent Labs  Lab 05/16/19 0402 05/17/19 0303 05/18/19 0026 05/18/19 0900 05/19/19 0939  WBC 15.0* 16.2* 14.5* 17.2* 17.1*  HGB 9.1* 8.3* 8.5* 9.5* 8.8*  HCT 28.7* 24.9* 25.7* 27.9* 26.4*  MCV 88.9 84.1 85.1 82.5 85.4  PLT 512* 449* 397 433* 394   Blood Culture    Component Value Date/Time   SDES TRACHEAL ASPIRATE 05/05/2019 1235   SPECREQUEST NONE 05/05/2019 1235   CULT FEW ENTEROBACTER CLOACAE 05/05/2019 1235   REPTSTATUS 05/07/2019 FINAL 05/05/2019  1235   Medications:  . allopurinol  100 mg Oral Daily  . aspirin  81 mg Oral Daily  . atorvastatin  80 mg Oral Daily  . carvedilol  25 mg Oral BID WC  . Chlorhexidine Gluconate Cloth  6 each Topical Q0600  . collagenase   Topical Daily  . darbepoetin (ARANESP) injection - DIALYSIS  200 mcg Intravenous Q Tue-HD  . feeding supplement (NEPRO CARB STEADY)  237 mL Oral TID BM  . feeding supplement (PRO-STAT SUGAR FREE 64)  30 mL Oral BID  . heparin injection (subcutaneous)  5,000 Units Subcutaneous Q8H  . insulin aspart  0-9 Units Subcutaneous TID WC  . multivitamin  1 tablet Oral QHS  . sevelamer carbonate  2.4 g Oral TID WC    Dialysis Orders: TTS- [facility] 4.5h F250 124.5kg 400/800 2/2.25 Hep 12,000 AVF - mircera 30 q4 last 10/13 - hect  4  Summary: Pt is a52 y.o.yo malewith ESRD who was admitted on11/10/2020with resp failure due to COVID/pulm edema and cardiac arrest   Plan: 1. Hx COVID HCAP: S/p abx - off airborne precautions. 2. Pulm edema/volume overload: Improved. On nasal cannula oxygen. 3. ESRD: Required CRRT 11/18- 11/24 - now back on intermittent HD TTS - next 12/3. 4. Anemia(ESRD + ABLA): S/p 1U PRBC 11/16 and 11/19. Continue max Aranesp 271mcg weekly. Hgb 8.8. 5. Secondary HPTH/hypercalcemia: Most likely d/t immobilization. No VDRA - given calcitonin 400mg  BID 11/25-11/26. Follow for now. If rises again, will consider pamidronate. 2Ca bath. Phos sky high (although > 30 was error), ^ Renvela pwdr dose to 2.4g TIW - better on last check. 6. HTN/volume: S/p significant weight loss (~20kg) from admit. UF as tolerated - 1.9L UF 12/1 but then tachycardic post-HD - possibly too much. 7. AMS: Felt to be anoxic brain injury - somewhat improved. Neuro following.  8. Hyperkalemia: Improved - was on Lokelma 10mg  TID - reduced to QD on 11/30, then held. Follow. 9. Debility: Will need ongoing PT/OT - per case managers to arrange.  Veneta Penton, PA-C 05/19/2019,  10:35 AM  [facility] Pager: [phone]

## 2019-05-19 NOTE — Progress Notes (Signed)
FPTS Interim Progress Note  S: Went to reassess patient given his post ictal state on previous evaluation.  Patient is moving all extremities.  He is not not responding to questions immediately.  When he does respond he responds with one-word answers like yes or no.  When asked if he knows where he is he just stares off into space.  When asked if he knows his full name he also stares off into space.  He would not follow commands such as when I asked him to squeeze my fingers or move his legs.  Although he moves his hands and arms as well as legs to get comfortable with no difficulty.  O: BP 105/70 (BP Location: Right Arm)   Pulse 87   Temp 99 F (37.2 C) (Rectal)   Resp 20   Ht 5\' 10"  (1.778 m)   Wt 101.2 kg   SpO2 99%   BMI 32.01 kg/m   General: Resting in bed, his delayed responses may be that he is still in a mild postictal state.  Improved from previous exam Cardio: Regular rate in the 80s.  Extremities are warm, distal pulses are intact Neuro: Moving all extremities, pupils equal and reactive to light, is not oriented at this time  A/P: New onset seizures Paged neurology.  We will discuss the case with them and determine how best to proceed.  CT head was ordered earlier and they arrived right as the patient was seizing.  I encouraged the nurse to call CT to see if we can get a CT as soon as possible.  We will continue to monitor overnight.  Gifford Shave, MD 05/19/2019, 9:30 PM PGY-1, [place] [facility] pager [phone]

## 2019-05-19 NOTE — Progress Notes (Signed)
Pt's daughter taking Pt's belongings home. Includes wallet, cell phone, jewelry, keys and duffle bag with clothes.

## 2019-05-20 ENCOUNTER — Inpatient Hospital Stay (HOSPITAL_COMMUNITY): Payer: Medicare Other

## 2019-05-20 DIAGNOSIS — J9601 Acute respiratory failure with hypoxia: Secondary | ICD-10-CM | POA: Diagnosis not present

## 2019-05-20 LAB — BASIC METABOLIC PANEL
Anion gap: 20 — ABNORMAL HIGH (ref 5–15)
BUN: 97 mg/dL — ABNORMAL HIGH (ref 6–20)
CO2: 24 mmol/L (ref 22–32)
Calcium: 10 mg/dL (ref 8.9–10.3)
Chloride: 90 mmol/L — ABNORMAL LOW (ref 98–111)
Creatinine, Ser: 10.12 mg/dL — ABNORMAL HIGH (ref 0.61–1.24)
GFR calc Af Amer: 6 mL/min — ABNORMAL LOW (ref >60)
GFR calc non Af Amer: 5 mL/min — ABNORMAL LOW (ref >60)
Glucose, Bld: 117 mg/dL — ABNORMAL HIGH (ref 70–99)
Potassium: 4.8 mmol/L (ref 3.5–5.1)
Sodium: 134 mmol/L — ABNORMAL LOW (ref 135–145)

## 2019-05-20 LAB — GLUCOSE, CAPILLARY
Glucose-Capillary: 104 mg/dL — ABNORMAL HIGH (ref 70–99)
Glucose-Capillary: 124 mg/dL — ABNORMAL HIGH (ref 70–99)
Glucose-Capillary: 121 mg/dL — ABNORMAL HIGH (ref 70–99)
Glucose-Capillary: 109 mg/dL — ABNORMAL HIGH (ref 70–99)

## 2019-05-20 LAB — CBC
HCT: 26.2 % — ABNORMAL LOW (ref 39.0–52.0)
Hemoglobin: 8.8 g/dL — ABNORMAL LOW (ref 13.0–17.0)
MCH: 27.9 pg (ref 26.0–34.0)
MCHC: 33.6 g/dL (ref 30.0–36.0)
MCV: 83.2 fL (ref 80.0–100.0)
Platelets: 382 10*3/uL (ref 150–400)
RBC: 3.15 MIL/uL — ABNORMAL LOW (ref 4.22–5.81)
RDW: 16.7 % — ABNORMAL HIGH (ref 11.5–15.5)
WBC: 18.1 10*3/uL — ABNORMAL HIGH (ref 4.0–10.5)
nRBC: 0 % (ref 0.0–0.2)

## 2019-05-20 MED ORDER — HEPARIN SODIUM (PORCINE) 1000 UNIT/ML DIALYSIS
20.0000 [IU]/kg | INTRAMUSCULAR | Status: DC | PRN
  Administered 2019-05-20: 14:00:00 2000 [IU] via INTRAVENOUS_CENTRAL
  Filled 2019-05-20: qty 2

## 2019-05-20 MED ORDER — HEPARIN SODIUM (PORCINE) 1000 UNIT/ML IJ SOLN
INTRAMUSCULAR | Status: AC
  Administered 2019-05-20: 2000 [IU] via INTRAVENOUS_CENTRAL
  Filled 2019-05-20: qty 2

## 2019-05-20 NOTE — Progress Notes (Signed)
Patient experienced a seizure. Dr. Caron Presume was at the bedside. The seizure lasted for 1 minute. After the seizure, he went into a deep snore. Dr. Kris Mouton was called & came to the bedside as well.   Neuro checks, IV fluids, and EKG was ordered.   Will continue to monitor the patient.

## 2019-05-20 NOTE — Progress Notes (Signed)
Patient refused use of cpap for the evening. Will continue to monitor patient.  

## 2019-05-20 NOTE — Plan of Care (Signed)
  Problem: Clinical Measurements: Goal: Ability to maintain clinical measurements within normal limits will improve Outcome: Not Progressing Goal: Will remain free from infection Outcome: Not Progressing Goal: Diagnostic test results will improve Outcome: Not Progressing Goal: Respiratory complications will improve Outcome: Not Progressing   Problem: Activity: Goal: Risk for activity intolerance will decrease Outcome: Not Progressing

## 2019-05-20 NOTE — Plan of Care (Signed)
Called by primary team resident MD regarding possible minute long generalized tonic-clonic seizure with following postictal lethargy for few minutes last night. An EEG has been ordered. The patient did present with some twitching/myoclonic jerking activity at the time of presentation and was on Keppra which was ultimately discontinued because of the episodes being captured on EEG and not consistent with seizures. I would recommend repeating an EEG and if that shows any abnormality or if the seizure-like activity recurs, please call us back and we will then reassess the patient. Plan discussed with the primary team resident MD over the phone. -- Amie Portland, MD [facility] Pager: [phone] If 7pm to 7am, please call on call as listed on AMION.

## 2019-05-20 NOTE — Progress Notes (Signed)
Patient's daughter called & was updated of the event.

## 2019-05-20 NOTE — Procedures (Signed)
Patient Name: Troy Wells  MRN: [id number]  Epilepsy Attending: Lora Havens  Referring Physician/Provider: Dr. Ree Shay Date: 05/19/2019  Duration: 21.48 minutes  Patient history: 53yo m who presented with PEA arrest. EEG to evaluate for seizure  Level of alertness: awake  AEDs during EEG study: None  Technical aspects: This EEG study was done with scalp electrodes positioned according to the 10-20 International system of electrode placement. Electrical activity was acquired at a sampling rate of 500Hz  and reviewed with a high frequency filter of 70Hz  and a low frequency filter of 1Hz . EEG data were recorded continuously and digitally stored.   Description: During awake state, no clear clear posterior dominant rhythm was seen.  EEG showed continuous generalized, maximal left temporal 3 to 6 Hz theta-delta slowing.  Hyperventilation and photic stimulation were not performed.  Abnormality -Continuous slow, generalized, maximal left temporal  IMPRESSION: This study is suggestive of nonspecific cortical dysfunction in left temporal region.  Additionally, there is evidence of moderate diffuse encephalopathy, nonspecific to etiology.  No seizures or epileptiform discharges were seen throughout the recording.  EEG appears improved compared to previous EEG on 05/05/2019.

## 2019-05-20 NOTE — Progress Notes (Signed)
[facility] Progress Note   Subjective:  Seen in room - eating breakfast. No CP/dyspnea. Per notes - had ?seizure overnight. Neuro evaluated - plan for EEG today.  Objective Vitals:   05/19/19 2300 05/20/19 0408 05/20/19 0410 05/20/19 0800  BP: (!) 112/31   112/68  Pulse: 72   94  Resp: 17   (!) 24  Temp:  98.6 F (37 C)  99 F (37.2 C)  TempSrc:  Oral  Oral  SpO2: 100%   100%  Weight:   103.9 kg   Height:       Physical Exam General: Frail appearing man, NAD.  Heart: RRR; no murmur Lungs: CTA anteriorly Abdomen: soft, non-tender Extremities: No LE edema Dialysis Access: AVF + thrill  Additional Objective Labs: Basic Metabolic Panel: Recent Labs  Lab 05/18/19 0026 05/18/19 0900 05/19/19 0939 05/19/19 2050 05/20/19 0228  NA 135  --  133* 135 134*  K 4.3  --  4.0 4.4 4.8  CL 90*  --  90* 89* 90*  CO2 22  --  23 21* 24  GLUCOSE 121*  --  120* 152* 117*  BUN 124*  --  74* 89* 97*  CREATININE 12.12*  --  8.06* 9.92* 10.12*  CALCIUM 9.8  --  10.1 10.1 10.0  PHOS >30.0* 5.8* 10.3*  --   --    Liver Function Tests: Recent Labs  Lab 05/18/19 0026 05/19/19 0939 05/19/19 2050  AST  --   --  22  ALT  --   --  28  ALKPHOS  --   --  77  BILITOT  --   --  0.6  PROT  --   --  6.8  ALBUMIN 2.5* 2.4* 2.5*   CBC: Recent Labs  Lab 05/18/19 0026 05/18/19 0900 05/19/19 0939 05/19/19 2050 05/20/19 0228  WBC 14.5* 17.2* 17.1* 17.5* 18.1*  NEUTROABS  --   --   --  14.4*  --   HGB 8.5* 9.5* 8.8* 8.2* 8.8*  HCT 25.7* 27.9* 26.4* 24.9* 26.2*  MCV 85.1 82.5 85.4 84.1 83.2  PLT 397 433* 394 366 382   Cardiac Enzymes: Recent Labs  Lab 05/19/19 2050  CKTOTAL 39*   CBG: Recent Labs  Lab 05/18/19 2137 05/19/19 0812 05/19/19 1150 05/19/19 1631 05/20/19 0759  GLUCAP 124* 105* 189* 101* 104*   Studies/Results: Ct Head Wo Contrast  Result Date: 05/20/2019 CLINICAL DATA:  Unwitnessed fall EXAM: CT HEAD WITHOUT CONTRAST TECHNIQUE: Contiguous  axial images were obtained from the base of the skull through the vertex without intravenous contrast. COMPARISON:  04/29/2019 FINDINGS: Brain: Old right cerebellar infarct, stable. No acute intracranial abnormality. Specifically, no hemorrhage, hydrocephalus, mass lesion, acute infarction, or significant intracranial injury. Vascular: No hyperdense vessel or unexpected calcification. Skull: No acute calvarial abnormality. Sinuses/Orbits: Mucosal thickening.  No acute findings. Other: None IMPRESSION: No acute intracranial abnormality. Electronically Signed   By: Rolm Baptise M.D.   On: 05/20/2019 00:32   Dg Chest Port 1 View  Result Date: 05/19/2019 CLINICAL DATA:  Fevers. EXAM: PORTABLE CHEST 1 VIEW COMPARISON:  05/13/2019 FINDINGS: Cardiac enlargement. Asymmetric opacity within the left lower lobe is identified. The left upper lobe and right lung are clear. No evidence for pleural effusion or edema. IMPRESSION: 1. Left lower lobe airspace opacity compatible with pneumonia. 2. Cardiac enlargement without heart failure. Electronically Signed   By: Kerby Moors M.D.   On: 05/19/2019 12:52   Medications: . sodium chloride 75 mL/hr (05/19/19 2031)   .  allopurinol  100 mg Oral Daily  . aspirin  81 mg Oral Daily  . atorvastatin  80 mg Oral Daily  . carvedilol  12.5 mg Oral BID WC  . Chlorhexidine Gluconate Cloth  6 each Topical Q0600  . collagenase   Topical Daily  . darbepoetin (ARANESP) injection - DIALYSIS  200 mcg Intravenous Q Tue-HD  . feeding supplement (NEPRO CARB STEADY)  237 mL Oral TID BM  . feeding supplement (PRO-STAT SUGAR FREE 64)  30 mL Oral BID  . heparin injection (subcutaneous)  5,000 Units Subcutaneous Q8H  . insulin aspart  0-9 Units Subcutaneous TID WC  . multivitamin  1 tablet Oral QHS  . sevelamer carbonate  2.4 g Oral TID WC    Dialysis Orders: TTS- [facility] 4.5h F250 124.5kg 400/800 2/2.25 Hep 12,000 AVF - mircera 30 q4 last 10/13 - hect  4  Summary: Pt is a62 y.o.yo malewith ESRD who was admitted on11/10/2020with resp failure due to COVID/pulm edema and cardiac arrest   Plan: 1.HxCOVIDHCAP: S/p abx - off airborne precautions. 2. Pulm edema/volume overload: Improved. On nasal cannula oxygen. 3. ESRD: RequiredCRRT 11/18- 11/24- now back on intermittent HD TTS -HD today. 4.Anemia(ESRD + ABLA): S/p1U PRBC11/16 and 11/19. Continue max Aranesp 257mcg weekly. Hgb 8.8. 5.Secondary HPTH/hypercalcemia: Most likely d/t immobilization. VDRA held - given calcitonin 400mg  BID 11/25-11/26. Follow for now. If rises again, will consider pamidronate. 2Ca bath. Phos sky high (although > 30 was error), ^ Renvela pwdr dose to 2.4g TIW. Will check PTH - may need sensipar. 6. HTN/volume: S/p significant weight loss (~20kg) from admit. UF as tolerated - 1.9L UF 12/1 but then tachycardic post-HD - possibly too much. 7. XTA:VWPV to be anoxic brain injury - somewhat improved. Neuro following.  8. Hyperkalemia: Improved - was on Lokelma 10mg  TID - reduced to QD on 11/30, then held. Follow. 9. Debility: Will need ongoing PT/OT - per case managers to arrange. 10. ?Seizure: Prev took Keppra - held this admit. Neuro following - for EEG today.  Veneta Penton, PA-C 05/20/2019, 10:45 AM  [employer] Pager: [phone]

## 2019-05-20 NOTE — Progress Notes (Signed)
FPTS Interim Progress Note  Received call from Dr. Rory Percy, neurologist on-call.  Reviewed EEG as well as CT scan.  Per Dr. Rory Percy there is less concern for current seizure.  States that if he has another seizure to please reconsult neurology and at that time can consider restarting Keppra.  At this time will not need any medications.  Caroline More, DO 05/20/2019, 2:29 PM PGY-3, [place] [facility] pager [phone]

## 2019-05-20 NOTE — Progress Notes (Signed)
Family Medicine Teaching Service Daily Progress Note Intern Pager: [phone]  Patient name: Troy Wells Medical record number: [id number] Date of birth: [birth year] Age: 53 y.o. Gender: male  Primary Care Provider: [name], MD Consultants: Neurology, critical care, nephrology Code Status: Full  Pt Overview and Major Events to Date:  04/27/2019 admitted to ICU 05/02/2019 MRI brain showing anoxic brain injury 05/10/2019: Extubated 05/12/2019: Started receiving care under FPTS 05/20/2019: EEG w/o seizure  Assessment and Plan: Troy Wells is a 53 y.o. male transferred from ICU after cardiac arrest with recent history of COVID-19 infection.  PMH significant for ESRD MWF on HD, OSA, HTN, HLD, HFpEF, pHTN, type 2 diabetes, and diabetic neuropathy.  Encephalopathy  Possible anoxic brain injury  PEA cardiac arrest Overnight there was concern for seizure like activity. CT head unchanged from last and stat EEG improved from last. The read is not back yet. Neuro was contacted this morning and recommended obtaining the read and if normal will not start antiepileptics, if abnormal will recontact for further work up and starting a medication. Patient was previously on keppra 11/11 for seizure like activity but is not on any medication currently. -f/u EEG read and recontact neurology if abnormal, appreciate recs no treatment needed if normal -Appreciate PT/OT working with patient -Social work to assist with placement, CIR preferred with SNF as backup -Continue cardiac monitoring for now, can likely DC soon -Monitor improvement   COVID (resolved)  Enterobacter HAP  Leukocytosis Patient now greater than 21 days out from Covid diagnosis. Has been cleared by infectious disease. Enterobacter cloacae from tracheal aspirate.  Treated with CTX, per ID no additional treatment needed at this time. Leukocytosis 18.1 today.  T-max 100.38F with afebrile temp this morning 98.6. LLL infiltrate on  CXR 12/2 -f/u blood cultures -Monitor fever curve, if fevers again start antibiotics -Monitor WBC on daily CBC   ESRD on HD  Hypercalcemia  Secondary hyperparathyroidism Dialysis per nephro TTS schedule.  K 4.8, BUN 97, Cr 10.12. Outpatient schedule is TTS. -Nephrology following, appreciate recs:  -Daily renal function panel -Aranesp every Tuesday with HD  HFmEF  CAD ECHO on 04/27/19 40 to 45%.  LV with mild to moderate decreased fxn, mildly increased LVH, inferiolateral akinesis and anterolateral severe hypokinesis. Grade 2 diastolic dysfunction.  -Continue aspirin 81 -Continue Lipitor 80 mg daily -Coreg 25 mg twice daily -Torsemide 100 mg daily on dialysis days and Sunday.  Hypertension  Tachycardia Normotensive, BP 112/68.  Not tachycardic this morning.  HR 93. -Continue Coreg 25 mg twice daily -Continue amlodipine 10 mg daily -Continue to hold home minoxidil 20 mg twice daily  OSA with use of CPAP  OSA at baseline and uses CPAP intermittently.  Now 21+ days out from Covid diagnosis, can use CPAP if needed. -Encourage patient to use CPAP at bedtime -wean off O2 during the day  Diabetes Home medications include glipizide 5 mg BID. Last A1c 5.8 on 04/14/2019.  Fasting glucose 101-189 today.  -sSSI -Monitor CBGs -Continue holding home glipizide  HLD Home medications include atorvastatin 40mg  -Atorvastatin 80mg  daily  Diabetic Neuropathy  Patient on home gabapentin 300 mg TID. Patient reports only taking BID.  -Hold gabapentin 300mg  TID    Gout  Home medications include allopurinol 100mg  daily and colchicine 0.6mg  BID as needed when symptomatic.  -Continue home allopurinol  FEN/GI:  Dysphagia 3 diet PPx: Heparin 5000 units subcu 3 times daily   Disposition: Pending EEG result, SNF    Subjective:  Overnight was found to  have seizure like activity. This morning he is alert and oriented person. He thinks it is 2017 and is January. He has not concerns as  of now would just like to sit up in bed.  Objective: Temp:  [98.6 F (37 C)-99.5 F (37.5 C)] 98.6 F (37 C) (12/03 0408) Pulse Rate:  [72] 72 (12/02 2300) Resp:  [12-20] 17 (12/02 2300) BP: (100-120)/(31-70) 112/31 (12/02 2300) SpO2:  [98 %-100 %] 100 % (12/02 2300) Weight:  [103.9 kg] 103.9 kg (12/03 0410)  Physical Exam:  .General: Appears well, no acute distress. Age appropriate. Lying supine in bed  Cardiac: RRR, normal heart sounds, no murmurs Respiratory: CTAB, normal effort Abdomen: soft, nontender, nondistended Extremities: No edema or cyanosis. Skin: Warm and dry, shoulder ulcer covered in guaze Neuro: alert and oriented, no focal deficits Psych: normal affect  Laboratory: Recent Labs  Lab 05/19/19 0939 05/19/19 2050 05/20/19 0228  WBC 17.1* 17.5* 18.1*  HGB 8.8* 8.2* 8.8*  HCT 26.4* 24.9* 26.2*  PLT 394 366 382   Recent Labs  Lab 05/19/19 0939 05/19/19 2050 05/20/19 0228  NA 133* 135 134*  K 4.0 4.4 4.8  CL 90* 89* 90*  CO2 23 21* 24  BUN 74* 89* 97*  CREATININE 8.06* 9.92* 10.12*  CALCIUM 10.1 10.1 10.0  PROT  --  6.8  --   BILITOT  --  0.6  --   ALKPHOS  --  77  --   ALT  --  28  --   AST  --  22  --   GLUCOSE 120* 152* 117*    Imaging/Diagnostic Tests: EXAM: CT HEAD WITHOUT CONTRAST  COMPARISON:  04/29/2019  IMPRESSION: No acute intracranial abnormality.    EEG 05/20/2019 IMPRESSION:  This study is suggestive of nonspecific cortical dysfunction in left temporal region.  Additionally, there is evidence of moderate diffuse encephalopathy, nonspecific to etiology.  No seizures or epileptiform discharges were seen throughout the recording.  EEG appears improved compared to previous EEG on 05/05/2019.     Gerlene Fee, DO 05/20/2019, 7:37 AM PGY-1, [place] Intern pager: [phone], text pages welcome

## 2019-05-21 DIAGNOSIS — J9601 Acute respiratory failure with hypoxia: Secondary | ICD-10-CM | POA: Diagnosis not present

## 2019-05-21 LAB — SARS CORONAVIRUS 2 (TAT 6-24 HRS): SARS Coronavirus 2: NEGATIVE

## 2019-05-21 LAB — RENAL FUNCTION PANEL
Albumin: 2.4 g/dL — ABNORMAL LOW (ref 3.5–5.0)
Anion gap: 17 — ABNORMAL HIGH (ref 5–15)
BUN: 46 mg/dL — ABNORMAL HIGH (ref 6–20)
CO2: 23 mmol/L (ref 22–32)
Calcium: 9.7 mg/dL (ref 8.9–10.3)
Chloride: 93 mmol/L — ABNORMAL LOW (ref 98–111)
Creatinine, Ser: 6.38 mg/dL — ABNORMAL HIGH (ref 0.61–1.24)
GFR calc Af Amer: 10 mL/min — ABNORMAL LOW (ref >60)
GFR calc non Af Amer: 9 mL/min — ABNORMAL LOW (ref >60)
Glucose, Bld: 110 mg/dL — ABNORMAL HIGH (ref 70–99)
Phosphorus: 7.5 mg/dL — ABNORMAL HIGH (ref 2.5–4.6)
Potassium: 3.8 mmol/L (ref 3.5–5.1)
Sodium: 133 mmol/L — ABNORMAL LOW (ref 135–145)

## 2019-05-21 LAB — CBC
HCT: 26.7 % — ABNORMAL LOW (ref 39.0–52.0)
Hemoglobin: 8.8 g/dL — ABNORMAL LOW (ref 13.0–17.0)
MCH: 28 pg (ref 26.0–34.0)
MCHC: 33 g/dL (ref 30.0–36.0)
MCV: 85 fL (ref 80.0–100.0)
Platelets: 403 10*3/uL — ABNORMAL HIGH (ref 150–400)
RBC: 3.14 MIL/uL — ABNORMAL LOW (ref 4.22–5.81)
RDW: 16.8 % — ABNORMAL HIGH (ref 11.5–15.5)
WBC: 13.7 10*3/uL — ABNORMAL HIGH (ref 4.0–10.5)
nRBC: 0 % (ref 0.0–0.2)

## 2019-05-21 LAB — GLUCOSE, CAPILLARY: Glucose-Capillary: 106 mg/dL — ABNORMAL HIGH (ref 70–99)

## 2019-05-21 LAB — PARATHYROID HORMONE, INTACT (NO CA): PTH: 459 pg/mL — ABNORMAL HIGH (ref 15–65)

## 2019-05-21 MED ORDER — DAKINS (1/4 STRENGTH) 0.125 % EX SOLN
Freq: Every day | CUTANEOUS | Status: AC
  Administered 2019-05-21 – 2019-05-22 (×3)
  Filled 2019-05-21 (×2): qty 473

## 2019-05-21 NOTE — TOC Progression Note (Signed)
Transition of Care [facility]) - Progression Note    Patient Details  Name: Troy Wells MRN: [id number] Date of Birth: [birth year]  Transition of Care [facility]) CM/SW [place], LCSW Phone Number: 05/21/2019, 10:42 AM  Clinical Narrative:    Patient's daughter returned CSW's call with her mother on the line as well. CSW presented patient's two SNF offers ([facility] and [facility]) and let them know patient is likely medically ready for discharge. They state they will research the facilities and let CSW know. They asked medical questions and explained fear that patient is not medically optimized for transfer. CSW referred them to the nurse and MD. MD aware. They asked CSW questions about long term care versus home health if patient improves at SNF. CSW directed them to Department of Social Services to complete Medicaid application. Patient's ex-wife stated she worked in a nursing facility before and is familiar with how that works. CSW checking with the two SNF facilities to make sure they can transport patient to dialysis.       Expected Discharge Plan: Skilled Nursing Facility Barriers to Discharge: Continued Medical Work up  Expected Discharge Plan and Services Expected Discharge Plan: [place] In-house Referral: Clinical Social Work   Post Acute Care Choice: IP Rehab Living arrangements for the past 2 months: Apartment                 DME Arranged: N/A         HH Arranged: NA           Social Determinants of Health (SDOH) Interventions    Readmission Risk Interventions Readmission Risk Prevention Plan 05/17/2019  Transportation Screening Complete  Medication Review Press photographer) Complete  PCP or Specialist appointment within 3-5 days of discharge Complete  HRI or Home Care Consult Complete  SW Recovery Care/Counseling Consult Complete  Palliative Care Screening Not [place] Complete  Some recent data might be  hidden

## 2019-05-21 NOTE — Progress Notes (Signed)
Patient's daughter requested facetime call with patient on evening shift.  Approx 2130, patient's called and attempted to communicated with patient by video.  Patient showed little interest in video call and looked at screen infrequently.  Patient had no verbal communication with his family.

## 2019-05-21 NOTE — Progress Notes (Signed)
[facility] Progress Note   Subjective: Awake, alert, moved to 2W as 5W is transitioning to covid floor. No C/Os.   Objective Vitals:   05/21/19 0500 05/21/19 0910 05/21/19 1149 05/21/19 1207  BP:  126/79 131/84   Pulse:  (!) 102    Resp:  (!) 22 20   Temp:  98.3 F (36.8 C) 99 F (37.2 C) 98.8 F (37.1 C)  TempSrc:  Oral Axillary Oral  SpO2:  100% 100%   Weight: 102.5 kg     Height:       Physical Exam General: Chronically ill appearing male in NAD Heart: S1,S2 RRR Lungs: CTAB Abdomen: Active BS ND Extremities: No LE edema. Heel protectors in place Dialysis Access: L AVF + bruit   Additional Objective Labs: Basic Metabolic Panel: Recent Labs  Lab 05/18/19 0900 05/19/19 0939 05/19/19 2050 05/20/19 0228 05/21/19 0129  NA  --  133* 135 134* 133*  K  --  4.0 4.4 4.8 3.8  CL  --  90* 89* 90* 93*  CO2  --  23 21* 24 23  GLUCOSE  --  120* 152* 117* 110*  BUN  --  74* 89* 97* 46*  CREATININE  --  8.06* 9.92* 10.12* 6.38*  CALCIUM  --  10.1 10.1 10.0 9.7  PHOS 5.8* 10.3*  --   --  7.5*   Liver Function Tests: Recent Labs  Lab 05/19/19 0939 05/19/19 2050 05/21/19 0129  AST  --  22  --   ALT  --  28  --   ALKPHOS  --  77  --   BILITOT  --  0.6  --   PROT  --  6.8  --   ALBUMIN 2.4* 2.5* 2.4*   No results for input(s): LIPASE, AMYLASE in the last 168 hours. CBC: Recent Labs  Lab 05/18/19 0900 05/19/19 0939 05/19/19 2050 05/20/19 0228 05/21/19 0129  WBC 17.2* 17.1* 17.5* 18.1* 13.7*  NEUTROABS  --   --  14.4*  --   --   HGB 9.5* 8.8* 8.2* 8.8* 8.8*  HCT 27.9* 26.4* 24.9* 26.2* 26.7*  MCV 82.5 85.4 84.1 83.2 85.0  PLT 433* 394 366 382 403*   Blood Culture    Component Value Date/Time   SDES BLOOD RIGHT HAND 05/19/2019 1205   SPECREQUEST  05/19/2019 1205    BOTTLES DRAWN AEROBIC ONLY Blood Culture results may not be optimal due to an inadequate volume of blood received in culture bottles   CULT  05/19/2019 1205    NO GROWTH 2  DAYS Performed at [facility] [address] [place], [place] [address]    REPTSTATUS PENDING 05/19/2019 1205    Cardiac Enzymes: Recent Labs  Lab 05/19/19 2050  CKTOTAL 39*   CBG: Recent Labs  Lab 05/20/19 0759 05/20/19 1210 05/20/19 1842 05/20/19 2152 05/21/19 0838  GLUCAP 104* 124* 121* 109* 106*   Iron Studies: No results for input(s): IRON, TIBC, TRANSFERRIN, FERRITIN in the last 72 hours. @lablastinr3 @ Studies/Results: Ct Head Wo Contrast  Result Date: 05/20/2019 CLINICAL DATA:  Unwitnessed fall EXAM: CT HEAD WITHOUT CONTRAST TECHNIQUE: Contiguous axial images were obtained from the base of the skull through the vertex without intravenous contrast. COMPARISON:  04/29/2019 FINDINGS: Brain: Old right cerebellar infarct, stable. No acute intracranial abnormality. Specifically, no hemorrhage, hydrocephalus, mass lesion, acute infarction, or significant intracranial injury. Vascular: No hyperdense vessel or unexpected calcification. Skull: No acute calvarial abnormality. Sinuses/Orbits: Mucosal thickening.  No acute findings. Other:  None IMPRESSION: No acute intracranial abnormality. Electronically Signed   By: Rolm Baptise M.D.   On: 05/20/2019 00:32   Medications:  . allopurinol  100 mg Oral Daily  . aspirin  81 mg Oral Daily  . atorvastatin  80 mg Oral Daily  . carvedilol  12.5 mg Oral BID WC  . Chlorhexidine Gluconate Cloth  6 each Topical Q0600  . collagenase   Topical Daily  . darbepoetin (ARANESP) injection - DIALYSIS  200 mcg Intravenous Q Tue-HD  . feeding supplement (NEPRO CARB STEADY)  237 mL Oral TID BM  . feeding supplement (PRO-STAT SUGAR FREE 64)  30 mL Oral BID  . heparin injection (subcutaneous)  5,000 Units Subcutaneous Q8H  . multivitamin  1 tablet Oral QHS  . sevelamer carbonate  2.4 g Oral TID WC     Dialysis Orders: TTS- [facility] 4.5h F250 124.5kg 400/800 2/2.25 Hep 12,000 AVF - mircera 30 q4 last 10/13 - hect  4  Summary: Pt is a60 y.o.yo malewith ESRD who was admitted on11/10/2020with resp failure due to COVID/pulm edema and cardiac arrest   Plan: 1.HxCOVIDHCAP: S/p abx - off airborne precautions. 2. Pulm edema/volume overload: Improved. On nasal cannula oxygen. 3. ESRD: RequiredCRRT 11/18- 11/24- now back on intermittent HD TTS -HD 12/05 4.Anemia(ESRD + ABLA): S/p1U PRBC11/16 and 11/19. Continue max Aranesp 234mcg weekly. Hgb 8.8. 5.Secondary HPTH/hypercalcemia: Most likely d/t immobilization. VDRA held - given calcitonin 400mg  BID 11/25-11/26. Follow for now. If rises again, will consider pamidronate. 2Ca bath. Phos sky high(although > 30 was error), Illene Regulus pwdr dose to 2.4g TIW. PTH 459 start sensipar 30 mg PO daily and uptitrate.  6. HTN/volume: S/p significant weight loss (~20kg) from admit. UF as tolerated -HD 12/03 Net UF 1.0 Liter. Post wt 102.5 kg.  7. STM:HDQQ to be anoxic brain injury - somewhat improved. Neuro following.  8. Hyperkalemia: Improved -was onLokelma 10mg  TID- reduced to QD on 11/30, then held. Follow. 9. Debility: Will need ongoing PT/OT - per case managers to arrange. 10. ?Seizure: Prev took Keppra - held this admit. Neuro following - for EEG today.  Rita H. Brown NP-C 05/21/2019, 1:25 PM  [employer] [phone]

## 2019-05-21 NOTE — Progress Notes (Signed)
Patient refused CPAP. CPAP on standby at bedside. Patient resting comfortably. Will continue to monitor.

## 2019-05-21 NOTE — Progress Notes (Signed)
FPTS Interim Progress Note  The patient's daughter Marijo Conception over the phone.  Daughter had reservations on patient being discharged to SNF as she said she felt like last time he was discharged too soon.  States that she wants to make sure everything is fine before he is discharged.  I explained patient's current status and the work-up we had completed so far.  She specifically wanted to know the work-up for seizure activity.  I explained to her that we had imaging as well as EEG and had neurology consulted.  I explained to her neurology recommendations.  She was appreciative of this and states that now that she understands the work-up we completed she feels more reassured.  States that she would like to possibly have a family meeting where she can talk to a physician in person.  We will plan for Sunday afternoon as patient's daughter reports that both her and her mother are available then.  She will call with a time that she is free on Sunday afternoon.  I informed her that she is welcome to ask [place] questions at any point and if she has questions prior to this meeting to please call the nursing station so that we may call her.  Caroline More, DO 05/21/2019, 6:15 PM PGY-3, [place] Medicine Service pager [phone]

## 2019-05-21 NOTE — Consult Note (Signed)
WOC Nurse Consult Note: Sacral wound assessed 05/14/19 has increased drainage and odor.  Will implement 3 days of Dakins moist kerlix to facilitate debridement and assess 05/24/19 to determine if Hydrotherapy should be added.  Was thin fibrin slough to wound bed.  Reason for Consult:Increased drainage and odor.  Wound type:unstageable sacral wound Pressure Injury POA: no Wound PZP:SUGAYGEFUWT Drainage (amount, consistency, odor) increased purulent drainage  Musty odor.  Periwound:intact.  Dressing procedure/placement/frequency: Stop Santyl daily.  Cleanse sacral wound with NS.  Apply Dakins moist gauze to wound bed. Cover with dry gauze and ABD pad.  Daily x 3 days. Wll re-assess 05/24/19 [place] team will follow.  Domenic Moras MSN, RN, FNP-BC CWON Wound, Ostomy, Continence Nurse Pager [phone]

## 2019-05-21 NOTE — TOC Progression Note (Signed)
Transition of Care [facility]) - Progression Note    Patient Details  Name: Troy Wells MRN: [id number] Date of Birth: 1966-05-16  Transition of Care [facility]) CM/SW [place], LCSW Phone Number: 05/21/2019, 10:16 AM  Clinical Narrative:    CSW left voicemail for patient's daughter to return call regarding SNF options.    Expected Discharge Plan: Skilled Nursing Facility Barriers to Discharge: Continued Medical Work up  Expected Discharge Plan and Services Expected Discharge Plan: [place] In-house Referral: Clinical Social Work   Post Acute Care Choice: IP Rehab Living arrangements for the past 2 months: Apartment                 DME Arranged: N/A         HH Arranged: NA           Social Determinants of Health (SDOH) Interventions    Readmission Risk Interventions Readmission Risk Prevention Plan 05/17/2019  Transportation Screening Complete  Medication Review Press photographer) Complete  PCP or Specialist appointment within 3-5 days of discharge Complete  HRI or Home Care Consult Complete  SW Recovery Care/Counseling Consult Complete  Palliative Care Screening Not [place] Complete  Some recent data might be hidden

## 2019-05-21 NOTE — Progress Notes (Addendum)
Family Medicine Teaching Service Daily Progress Note Intern Pager: [phone]  Patient name: Troy Wells Medical record number: [id number] Date of birth: [birth year] Age: 53 y.o. Gender: male  Primary Care Provider: [name], MD Consultants: Neurology, critical care, nephrology Code Status: Full  Pt Overview and Major Events to Date:  04/27/2019 admitted to ICU 05/02/2019 MRI brain showing anoxic brain injury 05/10/2019: Extubated 05/12/2019: Started receiving care under FPTS 05/20/2019: EEG w/o seizure  Assessment and Plan: Troy Wells is a 53 y.o. male transferred from ICU after cardiac arrest with recent history of COVID-19 infection.  PMH significant for ESRD MWF on HD, OSA, HTN, HLD, HFpEF, pHTN, type 2 diabetes, and diabetic neuropathy.  Encephalopathy  Possible anoxic brain injury  PEA cardiac arrest No events overnight. Is talkative this morning and eating breakfast. Continues to show improvement. -Neuro has signed off. -Appreciate PT/OT working with patient -Social work to assist with placement, CIR preferred with SNF as backup -Continue cardiac monitoring for now, can likely DC soon -Monitor improvement   COVID (resolved)  Enterobacter HAP  Leukocytosis Patient now greater than 21 days out from Covid diagnosis. Has been cleared by infectious disease. Enterobacter cloacae from tracheal aspirate.  Treated with CTX, per ID no additional treatment needed at this time. Leukocytosis down trending 13.7 today.  T-max 100.37F with afebrile temp this morning 98.8. LLL infiltrate on CXR 12/2 -f/u blood cultures, no growth < 24 hrs -Monitor fever curve, if fevers again start antibiotics -Monitor WBC on daily CBC   ESRD on HD  Hypercalcemia  Secondary hyperparathyroidism Dialysis per nephro TTS schedule.  K 3.8, BUN 46, Cr 6.38. Outpatient schedule is TTS. -Nephrology following, appreciate recs:  -Daily renal function panel -Aranesp every Tuesday with HD  HFmEF   CAD ECHO on 04/27/19 40 to 45%.  LV with mild to moderate decreased fxn, mildly increased LVH, inferiolateral akinesis and anterolateral severe hypokinesis. Grade 2 diastolic dysfunction.  -Continue aspirin 81 -Continue Lipitor 80 mg daily -Coreg 25 mg twice daily -Torsemide 100 mg daily on dialysis days and Sunday.  Hypertension  Tachycardia Normotensive, BP 128/81.  Not tachycardic this morning.  HR 103. -Continue Coreg 25 mg twice daily -Continue amlodipine 10 mg daily -Continue to hold home minoxidil 20 mg twice daily  OSA with use of CPAP  Currently on 2L O2 [place] 99%. OSA at baseline and uses CPAP intermittently.  Now 21+ days out from Covid diagnosis, can use CPAP if needed. -Encourage patient to use CPAP at bedtime -wean off O2 during the day  Diabetes Home medications include glipizide 5 mg BID. Last A1c 5.8 on 04/14/2019. Glucose 110.  -sSSI -Monitor CBGs -Continue holding home glipizide  HLD Home medications include atorvastatin 40mg  -Atorvastatin 80mg  daily  Diabetic Neuropathy  Patient on home gabapentin 300 mg TID. Patient reports only taking BID.  -Hold gabapentin 300mg  TID    Gout  Home medications include allopurinol 100mg  daily and colchicine 0.6mg  BID as needed when symptomatic.  -Continue home allopurinol  FEN/GI:  Dysphagia 3 diet PPx: Heparin 5000 units subcu 3 times daily   Disposition: Medically stable to go to SNF. Family meeting Sunday 12/6.   Subjective:  Says he feels like crap today but is ok. He does not have any specific complaints.   Objective: Temp:  [98.2 F (36.8 C)-98.9 F (37.2 C)] 98.8 F (37.1 C) (12/04 0400) Pulse Rate:  [85-98] 95 (12/04 0000) Resp:  [14-24] 14 (12/04 0000) BP: (102-134)/(46-91) 128/81 (12/04 0000) SpO2:  [98 %-100 %]  99 % (12/04 0000) Weight:  [102.5 kg-103.9 kg] 102.5 kg (12/04 0500)  Physical Exam: General: Appears well, no acute distress. Age appropriate. Sitting up in bed eating. Cardiac:  RRR, normal heart sounds, no murmurs Respiratory: CTAB, normal effort Extremities: No edema or cyanosis. Unna boots on. Skin: Warm and dry, no rashes noted Neuro: alert and oriented, no focal deficits Psych: normal affect   Laboratory: Recent Labs  Lab 05/19/19 2050 05/20/19 0228 05/21/19 0129  WBC 17.5* 18.1* 13.7*  HGB 8.2* 8.8* 8.8*  HCT 24.9* 26.2* 26.7*  PLT 366 382 403*   Recent Labs  Lab 05/19/19 2050 05/20/19 0228 05/21/19 0129  NA 135 134* 133*  K 4.4 4.8 3.8  CL 89* 90* 93*  CO2 21* 24 23  BUN 89* 97* 46*  CREATININE 9.92* 10.12* 6.38*  CALCIUM 10.1 10.0 9.7  PROT 6.8  --   --   BILITOT 0.6  --   --   ALKPHOS 77  --   --   ALT 28  --   --   AST 22  --   --   GLUCOSE 152* 117* 110*    Imaging/Diagnostic Tests: Now new imaging    Gerlene Fee, DO 05/21/2019, 8:09 AM PGY-1, [place] Intern pager: [phone], text pages welcome

## 2019-05-22 DIAGNOSIS — J9601 Acute respiratory failure with hypoxia: Secondary | ICD-10-CM | POA: Diagnosis not present

## 2019-05-22 LAB — CBC
HCT: 27.8 % — ABNORMAL LOW (ref 39.0–52.0)
Hemoglobin: 9 g/dL — ABNORMAL LOW (ref 13.0–17.0)
MCH: 27.4 pg (ref 26.0–34.0)
MCHC: 32.4 g/dL (ref 30.0–36.0)
MCV: 84.5 fL (ref 80.0–100.0)
Platelets: 423 10*3/uL — ABNORMAL HIGH (ref 150–400)
RBC: 3.29 MIL/uL — ABNORMAL LOW (ref 4.22–5.81)
RDW: 16.5 % — ABNORMAL HIGH (ref 11.5–15.5)
WBC: 12.3 10*3/uL — ABNORMAL HIGH (ref 4.0–10.5)
nRBC: 0 % (ref 0.0–0.2)

## 2019-05-22 LAB — RENAL FUNCTION PANEL
Albumin: 2.5 g/dL — ABNORMAL LOW (ref 3.5–5.0)
Anion gap: 19 — ABNORMAL HIGH (ref 5–15)
BUN: 84 mg/dL — ABNORMAL HIGH (ref 6–20)
CO2: 23 mmol/L (ref 22–32)
Calcium: 10.3 mg/dL (ref 8.9–10.3)
Chloride: 93 mmol/L — ABNORMAL LOW (ref 98–111)
Creatinine, Ser: 9.55 mg/dL — ABNORMAL HIGH (ref 0.61–1.24)
GFR calc Af Amer: 6 mL/min — ABNORMAL LOW (ref >60)
GFR calc non Af Amer: 6 mL/min — ABNORMAL LOW (ref >60)
Glucose, Bld: 101 mg/dL — ABNORMAL HIGH (ref 70–99)
Phosphorus: 10.1 mg/dL — ABNORMAL HIGH (ref 2.5–4.6)
Potassium: 4.6 mmol/L (ref 3.5–5.1)
Sodium: 135 mmol/L (ref 135–145)

## 2019-05-22 MED ORDER — CINACALCET HCL 30 MG PO TABS
30.0000 mg | ORAL_TABLET | Freq: Every day | ORAL | Status: DC
  Administered 2019-05-23 – 2019-06-15 (×21): 30 mg via ORAL
  Filled 2019-05-22 (×27): qty 1

## 2019-05-22 NOTE — Progress Notes (Signed)
Family Medicine Teaching Service Daily Progress Note Intern Pager: [phone]  Patient name: Troy Wells Medical record number: [id number] Date of birth: [birth year] Age: 53 y.o. Gender: male  Primary Care Provider: [name], MD Consultants: Neurology, critical care, nephrology Code Status: Full  Pt Overview and Major Events to Date:  04/27/2019 admitted to ICU 05/02/2019 MRI brain showing anoxic brain injury 05/10/2019: Extubated 05/12/2019: Started receiving care under FPTS 05/20/2019: EEG w/o seizure  Assessment and Plan: Troy Wells is a 53 y.o. male transferred from ICU after cardiac arrest with recent history of COVID-19 infection.  PMH significant for ESRD MWF on HD, OSA, HTN, HLD, HFpEF, pHTN, type 2 diabetes, and diabetic neuropathy.  Encephalopathy  Possible anoxic brain injury  PEA cardiac arrest No events overnight.  Is resting comfortably when I evaluate him this morning.  Continues to show improvement. -Neuro has signed off. -Appreciate PT/OT working with patient -Social work to assist with placement, CIR preferred with SNF as backup -Continue cardiac monitoring for now, can likely DC soon -Monitor improvement  - PT/OT Eval and treat   COVID (resolved)  Enterobacter HAP  Leukocytosis Patient now greater than 21 days out from Covid diagnosis. Has been cleared by infectious disease. Enterobacter cloacae from tracheal aspirate.  Treated with CTX, per ID no additional treatment needed at this time. Leukocytosis down trending 13.7 today.  T-max 100.80F with afebrile temp this morning 98.8. LLL infiltrate on CXR 12/2.  Repeat Covid test on 05/21/2019 is negative. -f/u blood cultures, no growth x2 days -Monitor fever curve, if fevers again start antibiotics -Monitor WBC on daily CBC   ESRD on HD  Hypercalcemia  Secondary hyperparathyroidism Dialysis per nephro TTS schedule.  K 3.8, BUN 46, Cr 6.38. Outpatient schedule is TTS.  Patient denied lab work this  morning but said that he would get it during the dialysis. -Nephrology following, appreciate recs:  -Daily renal function panel -Aranesp every Tuesday with HD  HFmEF  CAD ECHO on 04/27/19 40 to 45%.  LV with mild to moderate decreased fxn, mildly increased LVH, inferiolateral akinesis and anterolateral severe hypokinesis. Grade 2 diastolic dysfunction.  -Continue aspirin 81 -Continue Lipitor 80 mg daily -Coreg 25 mg twice daily -Torsemide 100 mg daily on dialysis days and Sunday.  Hypertension  Tachycardia Normotensive, BP 124/82.  Not tachycardic this morning.  HR 83 -Continue Coreg 25 mg twice daily  -Continue amlodipine 10 mg daily -Continue to hold home minoxidil 20 mg twice daily  OSA with use of CPAP  Currently on 2L O2 [place] 99%. OSA at baseline and uses CPAP intermittently.  Now 21+ days out from Covid diagnosis, can use CPAP if needed. -Encourage patient to use CPAP at bedtime -wean off O2 during the day  Diabetes Home medications include glipizide 5 mg BID. Last A1c 5.8 on 04/14/2019. Glucose not checked this morning -sSSI -Monitor CBGs -Continue holding home glipizide  HLD Home medications include atorvastatin 40mg  -Atorvastatin 80mg  daily  Diabetic Neuropathy  Patient on home gabapentin 300 mg TID. Patient reports only taking BID.  -Hold gabapentin 300mg  TID    Gout  Home medications include allopurinol 100mg  daily and colchicine 0.6mg  BID as needed when symptomatic.  -Continue home allopurinol  FEN/GI:  Dysphagia 3 diet PPx: Heparin 5000 units subcu 3 times daily   Disposition: Medically stable to go to SNF. Family meeting Sunday 12/6.  Subjective:  Patient is doing well this morning.  Denies any issues although he did decline lab work because he was stuck once  and did not want to get stuck again.  Patient said that he would get lab work done in dialysis today. Objective: Temp:  [98 F (36.7 C)-99 F (37.2 C)] (P) 98 F (36.7 C) (12/04  2329) Pulse Rate:  [78-102] (P) 82 (12/04 2329) Resp:  [14-22] 14 (12/04 2216) BP: (124-131)/(75-84) 124/82 (12/04 2216) SpO2:  [99 %-100 %] 99 % (12/04 2216) Weight:  [102.5 kg] 102.5 kg (12/04 0500)  Physical Exam: General: Appears well, no acute distress.  Resting comfortably on side.  Responds appropriately to questions.  No concerns at this time Cardiac: RRR, normal heart sounds, no murmurs Respiratory: CTAB, normal effort Extremities: No edema or cyanosis. Unna boots on. Skin: Warm and dry, no rashes noted Neuro: alert and oriented, no focal deficits Psych: normal affect   Laboratory: Recent Labs  Lab 05/19/19 2050 05/20/19 0228 05/21/19 0129  WBC 17.5* 18.1* 13.7*  HGB 8.2* 8.8* 8.8*  HCT 24.9* 26.2* 26.7*  PLT 366 382 403*   Recent Labs  Lab 05/19/19 2050 05/20/19 0228 05/21/19 0129  NA 135 134* 133*  K 4.4 4.8 3.8  CL 89* 90* 93*  CO2 21* 24 23  BUN 89* 97* 46*  CREATININE 9.92* 10.12* 6.38*  CALCIUM 10.1 10.0 9.7  PROT 6.8  --   --   BILITOT 0.6  --   --   ALKPHOS 77  --   --   ALT 28  --   --   AST 22  --   --   GLUCOSE 152* 117* 110*    Imaging/Diagnostic Tests: Now new imaging  Gifford Shave, MD 05/22/2019, 4:59 AM PGY-1, [place] Intern pager: [phone], text pages welcome

## 2019-05-22 NOTE — Progress Notes (Signed)
[facility] Progress Note   Subjective: Seen on HD. Tolerating well. No new complaints.      Objective Vitals:   05/21/19 2216 05/21/19 2329 05/22/19 0500 05/22/19 0900  BP: 124/82 128/76  119/73  Pulse: 78 82  82  Resp: 14 16  16   Temp: 98 F (36.7 C) 98 F (36.7 C)  99.2 F (37.3 C)  TempSrc: Oral Oral  Oral  SpO2: 99% 99%  99%  Weight:   102.5 kg   Height:       Physical Exam General: Chronically ill appearing male in NAD Heart: S1,S2 RRR Lungs: CTAB Abdomen: Active BS ND Extremities: No LE edema. Heel protectors in place Dialysis Access: L AVF blood lines connected. No issues with AVF.    Additional Objective Labs: Basic Metabolic Panel: Recent Labs  Lab 05/19/19 0939  05/20/19 0228 05/21/19 0129 05/22/19 0814  NA 133*   < > 134* 133* 135  K 4.0   < > 4.8 3.8 4.6  CL 90*   < > 90* 93* 93*  CO2 23   < > 24 23 23   GLUCOSE 120*   < > 117* 110* 101*  BUN 74*   < > 97* 46* 84*  CREATININE 8.06*   < > 10.12* 6.38* 9.55*  CALCIUM 10.1   < > 10.0 9.7 10.3  PHOS 10.3*  --   --  7.5* 10.1*   < > = values in this interval not displayed.   Liver Function Tests: Recent Labs  Lab 05/19/19 2050 05/21/19 0129 05/22/19 0814  AST 22  --   --   ALT 28  --   --   ALKPHOS 77  --   --   BILITOT 0.6  --   --   PROT 6.8  --   --   ALBUMIN 2.5* 2.4* 2.5*   No results for input(s): LIPASE, AMYLASE in the last 168 hours. CBC: Recent Labs  Lab 05/19/19 0939 05/19/19 2050 05/20/19 0228 05/21/19 0129 05/22/19 0814  WBC 17.1* 17.5* 18.1* 13.7* 12.3*  NEUTROABS  --  14.4*  --   --   --   HGB 8.8* 8.2* 8.8* 8.8* 9.0*  HCT 26.4* 24.9* 26.2* 26.7* 27.8*  MCV 85.4 84.1 83.2 85.0 84.5  PLT 394 366 382 403* 423*   Blood Culture    Component Value Date/Time   SDES BLOOD RIGHT HAND 05/19/2019 1205   SPECREQUEST  05/19/2019 1205    BOTTLES DRAWN AEROBIC ONLY Blood Culture results may not be optimal due to an inadequate volume of blood received in culture  bottles   CULT  05/19/2019 1205    NO GROWTH 2 DAYS Performed at [facility] [address] [place], [place] [address]    REPTSTATUS PENDING 05/19/2019 1205    Cardiac Enzymes: Recent Labs  Lab 05/19/19 2050  CKTOTAL 39*   CBG: Recent Labs  Lab 05/20/19 0759 05/20/19 1210 05/20/19 1842 05/20/19 2152 05/21/19 0838  GLUCAP 104* 124* 121* 109* 106*   Iron Studies: No results for input(s): IRON, TIBC, TRANSFERRIN, FERRITIN in the last 72 hours. @lablastinr3 @ Studies/Results: No results found. Medications:  . allopurinol  100 mg Oral Daily  . aspirin  81 mg Oral Daily  . atorvastatin  80 mg Oral Daily  . carvedilol  12.5 mg Oral BID WC  . Chlorhexidine Gluconate Cloth  6 each Topical Q0600  . collagenase   Topical Daily  . darbepoetin (ARANESP) injection - DIALYSIS  200  mcg Intravenous Q Tue-HD  . feeding supplement (NEPRO CARB STEADY)  237 mL Oral TID BM  . feeding supplement (PRO-STAT SUGAR FREE 64)  30 mL Oral BID  . heparin injection (subcutaneous)  5,000 Units Subcutaneous Q8H  . multivitamin  1 tablet Oral QHS  . sevelamer carbonate  2.4 g Oral TID WC  . sodium hypochlorite   Irrigation q1800     Dialysis Orders: TTS- [facility] 4.5h F250 124.5kg 400/800 2/2.25 Hep 12,000 AVF - mircera 30 q4 last 10/13 - hect 4  Summary: Pt is a32 y.o.yo malewith ESRD who was admitted on11/10/2020with resp failure due to COVID/pulm edema and cardiac arrest   Plan: 1.HxCOVIDHCAP: S/p abx - off airborne precautions. 2. Pulm edema/volume overload: Improved. On nasal cannula oxygen. 3. ESRD: RequiredCRRT 11/18- 11/24- now back on intermittent HD TTS -HD 12/05 4.Anemia(ESRD + ABLA): S/p1U PRBC11/16 and 11/19. Continue max Aranesp 217mcg weekly. Hgb 8.8. 5.Secondary HPTH/hypercalcemia: Most likely d/t immobilization. VDRAheld- given calcitonin 400mg  BID 11/25-11/26. Follow for now. If rises again, will consider pamidronate. 2Ca bath. Phos  sky high(although >30 was error), Illene Regulus pwdr dose to 2.4g TIW. PTH 459 start sensipar 30 mg PO daily and uptitrate.  6. HTN/volume: S/p significant weight loss (~20kg) from admit. UF as tolerated -UFG 1 liter today. No excess volume by exam.   7. SLH:TDSK to be anoxic brain injury - somewhat improved.  8. Hyperkalemia: Resolved. 9. Debility: Will need ongoing PT/OT - per case managers to arrange. 10. ?Seizure:Prev took [place] - held this admit. Seen by neuro. EEG W/O seizure activity. Neuro signed off.   Anacaren Kohan H. Yassir Enis NP-C 05/22/2019, 10:20 AM  [employer] [phone]

## 2019-05-22 NOTE — Progress Notes (Signed)
Pt completed hemodialysis treatment on 05/21/04; Paper charting placed in the patient's chart;UF=1370ml.

## 2019-05-23 DIAGNOSIS — J9601 Acute respiratory failure with hypoxia: Secondary | ICD-10-CM | POA: Diagnosis not present

## 2019-05-23 NOTE — Progress Notes (Signed)
[facility] to verify order for regular bed. Patient currently in a low air bed due to pressure injuries on sacrum and shoulder. Family Medicine verified they do want him in regular bed so patient can move more on his own. Patient was transfer to regular bed with mat on floor and seizure precautions in place.

## 2019-05-23 NOTE — Progress Notes (Signed)
RT NOTE: RT placed patient on dreamstation CPAP at 5cm pressure. Patient resting comfortably at this time. Vitals are stable and sats are 96%. RT will continue to monitor.

## 2019-05-23 NOTE — Progress Notes (Signed)
Physical Therapy Treatment Patient Details Name: Troy Wells MRN: [id number] DOB: [birth year] Today's Date: 05/23/2019    History of Present Illness 53 yo male presenting to [facility] with cardiac arrest and CPR initatied by EMS. Pt at dialysis, complained of SOB, and became unresponsive. Pt with prior hospital admission (10/27 - 11/3) for COVID associated pneumonia; active problems of acute hypoxemic respiratory failure NSTEMI during admission. Intubated 11/10-11/23. MRI showing bilateral medial thalami is suspicious for anoxic injury and chronic R PICA cerebellar infarct.  Transitioned off of CRRT 05/11/19, femoral line removed 05/13/19.PMH including ESRD on HD, chronic anemia, chronic systolic heart failure, CAD, HTN, OSA on CPAP, DM2, and HLD.    PT Comments    Pt with noticeable improvement today; alert and conversing some, participatory during therapy. Pt requiring two person maximal assist to stand from edge of bed briefly with use of back of recliner to pull up from. Limited in tolerance by lower back/sacral pain and fatigue. Rest of session focused on promoting upright posture with scapular retractions, shoulder flexion, and cervical extension. D/c plan remains appropriate.    Follow Up Recommendations  SNF;Supervision/Assistance - 24 hour     Equipment Recommendations  Hospital bed;Other (comment)(hoyer lift)    Recommendations for Other Services       Precautions / Restrictions Precautions Precautions: Fall Restrictions Weight Bearing Restrictions: No    Mobility  Bed Mobility Overal bed mobility: Needs Assistance       Supine to sit: Max assist;+2 for physical assistance Sit to supine: Max assist;+2 for physical assistance   General bed mobility comments: Pt with difficulty initiating, therefore requiring maxA + 2 for BLE and truncal management  Transfers Overall transfer level: Needs assistance Equipment used: (handles on recliner) Transfers: Sit to/from  Stand Sit to Stand: Max assist;+2 physical assistance         General transfer comment: Pt unable to stand on first attempt from elevated bed height. Successful on 2nd attempt with use of pulling up on back of recliner handles, max+ 2 for boost, and use of bed pad for hip control. Pt able to sustain upright posture for ~10 seconds with multimodal cueing. Limited by pain.  Ambulation/Gait                 Stairs             Wheelchair Mobility    Modified Rankin (Stroke Patients Only) Modified Rankin (Stroke Patients Only) Pre-Morbid Rankin Score: No symptoms Modified Rankin: Severe disability     Balance Overall balance assessment: Needs assistance Sitting-balance support: No upper extremity supported;Feet supported Sitting balance-Leahy Scale: Fair Sitting balance - Comments: supervision for safety   Standing balance support: Bilateral upper extremity supported Standing balance-Leahy Scale: Poor Standing balance comment: reliant on external support; unable to maintain for extended period                            Cognition Arousal/Alertness: Awake/alert Behavior During Therapy: Flat affect Overall Cognitive Status: Impaired/Different from baseline Area of Impairment: Attention;Memory;Following commands;Safety/judgement;Awareness;Problem solving                 Orientation Level: Disoriented to;Place;Time;Situation Current Attention Level: Sustained Memory: Decreased short-term memory Following Commands: Follows one step commands with increased time Safety/Judgement: Decreased awareness of deficits;Decreased awareness of safety Awareness: Intellectual Problem Solving: Difficulty sequencing;Requires verbal cues;Requires tactile cues;Slow processing;Decreased initiation General Comments: Pt much more alert, able to verbalize name and DOB. Stating "I don't  know," to orientation questions of year and month. Able to follow one step commands with  increased time; needs prompting to verbalize pain location/description. Requesting some basic needs i.e. need to lie back down, but has decreased initiation noted with tasks.      Exercises General Exercises - Upper Extremity Shoulder Flexion: AAROM;Both;5 reps;Seated General Exercises - Lower Extremity Long Arc Quad: Both;5 reps;Seated Other Exercises Other Exercises: Sitting EOB: scapular retractions x 5 also focusing on cervical extension    General Comments  VSS      Pertinent Vitals/Pain Pain Assessment: Faces Faces Pain Scale: Hurts even more Pain Location: low back, sacrum Pain Descriptors / Indicators: Grimacing;Guarding;Moaning Pain Intervention(s): Limited activity within patient's tolerance;Monitored during session    Home Living                      Prior Function            PT Goals (current goals can now be found in the care plan section) Acute Rehab PT Goals Patient Stated Goal: less pain Potential to Achieve Goals: Good Progress towards PT goals: Progressing toward goals    Frequency    Min 3X/week      PT Plan Frequency needs to be updated    Co-evaluation PT/OT/SLP Co-Evaluation/Treatment: Yes Reason for Co-Treatment: Complexity of the patient's impairments (multi-system involvement);Necessary to address cognition/behavior during functional activity;For patient/therapist safety;To address functional/ADL transfers PT goals addressed during session: Mobility/safety with mobility        AM-PAC PT "6 Clicks" Mobility   Outcome Measure  Help needed turning from your back to your side while in a flat bed without using bedrails?: A Lot Help needed moving from lying on your back to sitting on the side of a flat bed without using bedrails?: Total Help needed moving to and from a bed to a chair (including a wheelchair)?: Total Help needed standing up from a chair using your arms (e.g., wheelchair or bedside chair)?: Total Help needed to walk  in hospital room?: Total Help needed climbing 3-5 steps with a railing? : Total 6 Click Score: 7    End of Session Equipment Utilized During Treatment: Gait belt Activity Tolerance: Patient tolerated treatment well Patient left: in bed;with call bell/phone within reach;with bed alarm set;with family/visitor present Nurse Communication: Mobility status PT Visit Diagnosis: Other abnormalities of gait and mobility (R26.89);Muscle weakness (generalized) (M62.81);Difficulty in walking, not elsewhere classified (R26.2);Pain     Time: 1014-1040 PT Time Calculation (min) (ACUTE ONLY): 26 min  Charges:  $Therapeutic Activity: 8-22 mins                     Ellamae Sia, PT, DPT Acute Rehabilitation Services Pager [phone] Office [phone]    Willy Eddy 05/23/2019, 1:08 PM

## 2019-05-23 NOTE — Progress Notes (Signed)
Occupational Therapy Treatment Patient Details Name: Troy Wells MRN: [id number] DOB: 1965/12/23 Today's Date: 05/23/2019    History of present illness 53 yo male presenting to [facility] with cardiac arrest and CPR initatied by EMS. Pt at dialysis, complained of SOB, and became unresponsive. Pt with prior hospital admission (10/27 - 11/3) for COVID associated pneumonia; active problems of acute hypoxemic respiratory failure NSTEMI during admission. Intubated 11/10-11/23. MRI showing bilateral medial thalami is suspicious for anoxic injury and chronic R PICA cerebellar infarct.  Transitioned off of CRRT 05/11/19, femoral line removed 05/13/19.PMH including ESRD on HD, chronic anemia, chronic systolic heart failure, CAD, HTN, OSA on CPAP, DM2, and HLD.   OT comments  Pt progressing towards established goals and presenting with increased participation and activity tolerance this session. Pt performing grooming at EOB with Min A for UE support and Min Guard A for sitting balance. Pt performing sit<>stand x2 with Mod A +2 and use of back of recliner to pull into standing. Daughter presenting with reporting that she works during day and family hopes to organize PCA for when pt returns home. Due to decreased activity tolerance and family support, update dc recommendation to SNF for further OT. Will continue to follow acutely as admitted. Updated goals.    Follow Up Recommendations  SNF;Supervision/Assistance - 24 hour    Equipment Recommendations  Other (comment)(Defer to next venue)    Recommendations for Other Services      Precautions / Restrictions Precautions Precautions: Fall Restrictions Weight Bearing Restrictions: No       Mobility Bed Mobility Overal bed mobility: Needs Assistance Bed Mobility: Supine to Sit;Sit to Supine     Supine to sit: Max assist;+2 for physical assistance Sit to supine: Max assist;+2 for physical assistance   General bed mobility comments: Pt with  difficulty initiating, therefore requiring maxA + 2 for BLE and truncal management  Transfers Overall transfer level: Needs assistance Equipment used: (handles on recliner) Transfers: Sit to/from Stand Sit to Stand: Max assist;+2 physical assistance         General transfer comment: Pt unable to stand on first attempt from elevated bed height. Successful on 2nd attempt with use of pulling up on back of recliner handles, max+ 2 for boost, and use of bed pad for hip control. Pt able to sustain upright posture for ~10 seconds with multimodal cueing. Limited by pain.    Balance Overall balance assessment: Needs assistance Sitting-balance support: No upper extremity supported;Feet supported Sitting balance-Leahy Scale: Fair Sitting balance - Comments: supervision for safety   Standing balance support: Bilateral upper extremity supported Standing balance-Leahy Scale: Poor Standing balance comment: reliant on external support; unable to maintain for extended period                           ADL either performed or assessed with clinical judgement   ADL Overall ADL's : Needs assistance/impaired     Grooming: Wash/dry face;Sitting;Minimal assistance Grooming Details (indicate cue type and reason): Pt requiring Min A for bringing BLEs towards hand with washing his face while sitting at EOB. Able to maintain sitting at EOB with Min Guard A demontrating increased sitting balance             Lower Body Dressing: Maximal assistance;Sit to/from stand Lower Body Dressing Details (indicate cue type and reason): Max A for donning socks while sitting at EOB             Functional mobility during  ADLs: Maximal assistance;+2 for physical assistance(sit<>stand) General ADL Comments: Pt demosntrating increased cognition and activity tolerance. Conrtinues to present with poor strength and fatigues quickly     Vision       Perception     Praxis      Cognition  Arousal/Alertness: Awake/alert Behavior During Therapy: Flat affect Overall Cognitive Status: Impaired/Different from baseline Area of Impairment: Attention;Memory;Following commands;Safety/judgement;Awareness;Problem solving                 Orientation Level: Disoriented to;Place;Time;Situation Current Attention Level: Sustained Memory: Decreased short-term memory Following Commands: Follows one step commands with increased time Safety/Judgement: Decreased awareness of deficits;Decreased awareness of safety Awareness: Intellectual Problem Solving: Difficulty sequencing;Requires verbal cues;Requires tactile cues;Slow processing;Decreased initiation General Comments: Pt much more alert, able to verbalize name and DOB. Stating "I don't know," to orientation questions of year and month. Able to follow one step commands with increased time; needs prompting to verbalize pain location/description. Requesting some basic needs i.e. need to lie back down, but has decreased initiation noted with tasks.        Exercises Exercises: Other exercises;General Lower Extremity General Exercises - Upper Extremity Shoulder Flexion: AAROM;Both;5 reps;Seated General Exercises - Lower Extremity Long Arc Quad: Both;5 reps;Seated Other Exercises Other Exercises: Sitting EOB: scapular retractions x 5 also focusing on cervical extension   Shoulder Instructions       General Comments VSS throughout    Pertinent Vitals/ Pain       Pain Assessment: Faces Faces Pain Scale: Hurts even more Pain Location: low back, sacrum Pain Descriptors / Indicators: Grimacing;Guarding;Moaning Pain Intervention(s): Monitored during session;Limited activity within patient's tolerance;Repositioned  Home Living                                          Prior Functioning/Environment              Frequency  Min 2X/week        Progress Toward Goals  OT Goals(current goals can now be found in  the care plan section)  Progress towards OT goals: Progressing toward goals  Acute Rehab OT Goals Patient Stated Goal: less pain OT Goal Formulation: With patient Time For Goal Achievement: 05/31/19 Potential to Achieve Goals: Good ADL Goals Pt Will Perform Grooming: with min assist;sitting;bed level Pt Will Transfer to Toilet: squat pivot transfer;bedside commode;with mod assist;with +2 assist Pt Will Perform Toileting - Clothing Manipulation and hygiene: with mod assist;sitting/lateral leans;sit to/from stand Pt/caregiver will Perform Home Exercise Program: Increased strength;Both right and left upper extremity;With minimal assist Additional ADL Goal #1: Pt will perform bed mobility with Mod A +2 in preparation for ADLs Additional ADL Goal #2: Pt will tolerate sitting at EOB for ~ 41minutes with Mod A in preparation for ADLs  Plan Discharge plan needs to be updated    Co-evaluation    PT/OT/SLP Co-Evaluation/Treatment: Yes Reason for Co-Treatment: Complexity of the patient's impairments (multi-system involvement);For patient/therapist safety;To address functional/ADL transfers   OT goals addressed during session: ADL's and self-care      AM-PAC OT "6 Clicks" Daily Activity     Outcome Measure   Help from another person eating meals?: A Lot Help from another person taking care of personal grooming?: A Little Help from another person toileting, which includes using toliet, bedpan, or urinal?: A Lot Help from another person bathing (including washing, rinsing, drying)?: A Lot Help from another  person to put on and taking off regular upper body clothing?: A Lot Help from another person to put on and taking off regular lower body clothing?: A Lot 6 Click Score: 13    End of Session    OT Visit Diagnosis: Unsteadiness on feet (R26.81);Other abnormalities of gait and mobility (R26.89);Muscle weakness (generalized) (M62.81)   Activity Tolerance Patient tolerated treatment well    Patient Left in bed;with call bell/phone within reach;with bed alarm set;with family/visitor present(Daughter in room)   Nurse Communication Mobility status        Time: 7371-0626 OT Time Calculation (min): 29 min  Charges: OT General Charges $OT Visit: 1 Visit OT Treatments $Self Care/Home Management : 8-22 mins  Herscher, OTR/L Acute Rehab Pager: [phone] Office: [place] 05/23/2019, 4:47 PM

## 2019-05-23 NOTE — Progress Notes (Addendum)
Family Medicine Teaching Service Daily Progress Note Intern Pager: [phone]  Patient name: Troy Wells Medical record number: [id number] Date of birth: [birth year] Age: 53 y.o. Gender: male  Primary Care Provider: [name], MD Consultants: Neurology, critical care, nephrology Code Status: Full  Pt Overview and Major Events to Date:  04/27/2019 admitted to ICU 05/02/2019 MRI brain showing anoxic brain injury 05/10/2019: Extubated 05/12/2019: Started receiving care under FPTS 05/20/2019: EEG w/o seizure  Assessment and Plan: Troy Wells is a 53 y.o. male transferred from ICU after cardiac arrest with recent history of COVID-19 infection.  PMH significant for ESRD MWF on HD, OSA, HTN, HLD, HFpEF, pHTN, type 2 diabetes, and diabetic neuropathy.  Encephalopathy  Possible anoxic brain injury  PEA cardiac arrest No events overnight.  Is resting comfortably when I evaluated him this morning.  Continues to show improvement. He is more talkative today with humor. -Neuro has signed off. -Appreciate PT/OT working with patient -Social work to assist with placement, CIR preferred with SNF as backup -Continue cardiac monitoring for now, can likely DC soon -Monitor improvement  - PT/OT Eval and treat   COVID (resolved)  Enterobacter HAP  Leukocytosis Patient now greater than 21 days out from Covid diagnosis. Has been cleared by infectious disease. Enterobacter cloacae from tracheal aspirate.  Treated with CTX, per ID no additional treatment needed at this time. Leukocytosis down trending 12.3 yesterday.  T-max 100.63F with afebrile temp this morning 98.0. LLL infiltrate on CXR 12/2.  Repeat Covid test on 05/21/2019 is negative. -f/u blood cultures, no growth x3 days -Monitor fever curve, if fevers again start antibiotics -Monitor WBC on daily CBC   ESRD on HD  Hypercalcemia  Secondary hyperparathyroidism Dialysis per nephro TTS schedule.  K 4.6, BUN 84, Cr 9.55. Outpatient  schedule is TTS.  Patient denied lab work this morning but said that he would get it during the dialysis. -Nephrology following, appreciate recs:  -Daily renal function panel -Aranesp every Tuesday with HD  HFmEF  CAD ECHO on 04/27/19 40 to 45%.  LV with mild to moderate decreased fxn, mildly increased LVH, inferiolateral akinesis and anterolateral severe hypokinesis. Grade 2 diastolic dysfunction.  -Continue aspirin 81 -Continue Lipitor 80 mg daily -Coreg 25 mg twice daily -Torsemide 100 mg daily on dialysis days and Sunday.  Hypertension  Tachycardia Normotensive, BP 131/72.  Not tachycardic this morning.  HR 83 -Continue Coreg 25 mg twice daily  -Continue amlodipine 10 mg daily -Continue to hold home minoxidil 20 mg twice daily  OSA with use of CPAP  Currently on RA 96%. OSA at baseline and uses CPAP intermittently.  Now 21+ days out from Covid diagnosis, can use CPAP if needed. -Encourage patient to use CPAP at bedtime  Diabetes Home medications include glipizide 5 mg BID. Last A1c 5.8 on 04/14/2019. Inpatient glucose has been stable. -Continue holding home glipizide  HLD Home medications include atorvastatin 40mg  -Atorvastatin 80mg  daily  Diabetic Neuropathy  Patient on home gabapentin 300 mg TID. Patient reports only taking BID.  -Hold gabapentin 300mg  TID    Gout  Home medications include allopurinol 100mg  daily and colchicine 0.6mg  BID as needed when symptomatic.  -Continue home allopurinol  LUE/sacral ulcer 2/2 to being bed bound in ICU. Showing signs of healing. -Continue wound care daily -Continue turning patient  q2h   FEN/GI:  Dysphagia 3 diet PPx: Heparin 5000 units subq 3 times daily   Disposition: Medically stable to go to SNF. Family meeting today.  Subjective:  He says  he is doing ok. No issues.   Objective: Temp:  [98 F (36.7 C)-99.2 F (37.3 C)] 98 F (36.7 C) (12/06 0300) Pulse Rate:  [82-90] 86 (12/06 0300) Resp:  [16-24] 20  (12/06 0300) BP: (109-131)/(63-73) 131/72 (12/06 0300) SpO2:  [94 %-99 %] 96 % (12/06 0300) Weight:  [102.5 kg] 102.5 kg (12/06 0503)  Physical Exam:  General: Appears well, no acute distress. Age appropriate. Lying supine in bed watching tv Cardiac: RRR, normal heart sounds, no murmurs Respiratory: CTAB, normal effort Extremities: No edema or cyanosis. Skin: Warm and dry, no rashes noted Neuro: alert and oriented, no focal deficits Psych: normal affect  Laboratory: Recent Labs  Lab 05/20/19 0228 05/21/19 0129 05/22/19 0814  WBC 18.1* 13.7* 12.3*  HGB 8.8* 8.8* 9.0*  HCT 26.2* 26.7* 27.8*  PLT 382 403* 423*   Recent Labs  Lab 05/19/19 2050 05/20/19 0228 05/21/19 0129 05/22/19 0814  NA 135 134* 133* 135  K 4.4 4.8 3.8 4.6  CL 89* 90* 93* 93*  CO2 21* 24 23 23   BUN 89* 97* 46* 84*  CREATININE 9.92* 10.12* 6.38* 9.55*  CALCIUM 10.1 10.0 9.7 10.3  PROT 6.8  --   --   --   BILITOT 0.6  --   --   --   ALKPHOS 77  --   --   --   ALT 28  --   --   --   AST 22  --   --   --   GLUCOSE 152* 117* 110* 101*    Imaging/Diagnostic Tests: Now new imaging  Gerlene Fee, DO 05/23/2019, 7:34 AM PGY-1, [facility] Intern pager: [phone], text pages welcome

## 2019-05-23 NOTE — Progress Notes (Signed)
Spoke with daughter Marijo Conception about father's medical care and discharge plan.  Her concern is that she did not want her dad to be discharged too soon and have to return.  We discussed that we have consulted neurology and they have signed off, EEG without seizures and he does not need medication or further treatment at this time.  We also discussed that father's vital signs have been stablizing over the past week and he continues to show improvement clinically.  She wanted to know how long it would be until he returned his baseline.  We discussed that it is unknown at the time but there is continued hope of continual slow progression.  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 Discussed hopeful for a new baseline and being hopeful that he continues to show improvement. She is considering [facility] or [facility] she would like information on each place to compare before she makes a decision. Will contact social work to get information to her.  During our conversation [place] expressed understanding and agrees with plan to discharged to rehab facility when a bed becomes available.

## 2019-05-23 NOTE — Progress Notes (Addendum)
[facility] Progress Note   Subjective: Seen in room. No new complaints. Primary at bedside evaluating sacral wound.  Objective Vitals:   05/22/19 2325 05/23/19 0300 05/23/19 0503 05/23/19 0858  BP: 109/63 131/72  136/62  Pulse: 90 86  90  Resp: 20 20  15   Temp: 98.9 F (37.2 C) 98 F (36.7 C)  98.5 F (36.9 C)  TempSrc:  Oral  Axillary  SpO2: 97% 96%  97%  Weight:   102.5 kg   Height:       Physical Exam General:Chronically ill appearing male in NAD Heart:S1,S2 RRR Lungs:CTAB Abdomen:Active BS ND Extremities:No LE edema. Heel protectors in place Dialysis Access:L AVF + bruit   Additional Objective Labs: Basic Metabolic Panel: Recent Labs  Lab 05/19/19 0939  05/20/19 0228 05/21/19 0129 05/22/19 0814  NA 133*   < > 134* 133* 135  K 4.0   < > 4.8 3.8 4.6  CL 90*   < > 90* 93* 93*  CO2 23   < > 24 23 23   GLUCOSE 120*   < > 117* 110* 101*  BUN 74*   < > 97* 46* 84*  CREATININE 8.06*   < > 10.12* 6.38* 9.55*  CALCIUM 10.1   < > 10.0 9.7 10.3  PHOS 10.3*  --   --  7.5* 10.1*   < > = values in this interval not displayed.   Liver Function Tests: Recent Labs  Lab 05/19/19 2050 05/21/19 0129 05/22/19 0814  AST 22  --   --   ALT 28  --   --   ALKPHOS 77  --   --   BILITOT 0.6  --   --   PROT 6.8  --   --   ALBUMIN 2.5* 2.4* 2.5*   No results for input(s): LIPASE, AMYLASE in the last 168 hours. CBC: Recent Labs  Lab 05/19/19 0939 05/19/19 2050 05/20/19 0228 05/21/19 0129 05/22/19 0814  WBC 17.1* 17.5* 18.1* 13.7* 12.3*  NEUTROABS  --  14.4*  --   --   --   HGB 8.8* 8.2* 8.8* 8.8* 9.0*  HCT 26.4* 24.9* 26.2* 26.7* 27.8*  MCV 85.4 84.1 83.2 85.0 84.5  PLT 394 366 382 403* 423*   Blood Culture    Component Value Date/Time   SDES BLOOD RIGHT HAND 05/19/2019 1205   SPECREQUEST  05/19/2019 1205    BOTTLES DRAWN AEROBIC ONLY Blood Culture results may not be optimal due to an inadequate volume of blood received in culture bottles    CULT  05/19/2019 1205    NO GROWTH 4 DAYS Performed at [facility], [place] [address], [place], [place] [address]    REPTSTATUS PENDING 05/19/2019 1205    Cardiac Enzymes: Recent Labs  Lab 05/19/19 2050  CKTOTAL 39*   CBG: Recent Labs  Lab 05/20/19 0759 05/20/19 1210 05/20/19 1842 05/20/19 2152 05/21/19 0838  GLUCAP 104* 124* 121* 109* 106*   Iron Studies: No results for input(s): IRON, TIBC, TRANSFERRIN, FERRITIN in the last 72 hours. @lablastinr3 @ Studies/Results: No results found. Medications:  . allopurinol  100 mg Oral Daily  . aspirin  81 mg Oral Daily  . atorvastatin  80 mg Oral Daily  . carvedilol  12.5 mg Oral BID WC  . Chlorhexidine Gluconate Cloth  6 each Topical Q0600  . cinacalcet  30 mg Oral Q supper  . collagenase   Topical Daily  . darbepoetin (ARANESP) injection - DIALYSIS  200 mcg Intravenous Q  Tue-HD  . feeding supplement (NEPRO CARB STEADY)  237 mL Oral TID BM  . feeding supplement (PRO-STAT SUGAR FREE 64)  30 mL Oral BID  . heparin injection (subcutaneous)  5,000 Units Subcutaneous Q8H  . multivitamin  1 tablet Oral QHS  . sevelamer carbonate  2.4 g Oral TID WC     Dialysis Orders: TTS- [facility] 4.5h F250 124.5kg 400/800 2/2.25 Hep 12,000 AVF - mircera 30 q4 last 10/13 - hect 4  Summary: Pt is a67 y.o.yo malewith ESRD who was admitted on11/10/2020with resp failure due to COVID/pulm edema and cardiac arrest   Plan: 1.HxCOVIDHCAP: S/p abx - off airborne precautions. 2. Pulm edema/volume overload: Improved. On nasal cannula oxygen. 3. ESRD: RequiredCRRT 11/18- 11/24- now back on intermittent HD TTS -Next HD12/08 4.Anemia(ESRD + ABLA): S/p1U PRBC11/16 and 11/19. Continue max Aranesp 218mcg weekly. Hgb 8.8. 5.Secondary HPTH/hypercalcemia: Most likely d/t immobilization. VDRAheld- given calcitonin 400mg  BID 11/25-11/26. Follow for now. If rises again, will consider pamidronate. 2Ca bath. Phos sky  high(although >30 was error), Illene Regulus pwdr dose to 2.4g TIW.PTH 459 start sensipar 30 mg PO daily and uptitrate. 6. HTN/volume: S/p significant weight loss (~20kg) from admit. UF as tolerated. HD 05/22/19 Net UF 1.3 liter. No wt.  7. FVC:BSWH to be anoxic brain injury - somewhat improved.  8. Hyperkalemia: Resolved. 9. Debility: Will need ongoing PT/OT - per case managers to arrange. 10. ?Seizure:Prev took Fox - held this admit. Seen by neuro. EEG W/O seizure activity. Neuro signed off.  11. Sacral Decub-Daily drsg changes. Per primary. Concern this may delay discharge if wound limits his ability to sit in recliner for HD.   Rita H. Brown NP-C 05/23/2019, 11:40 AM  [employer] [phone]

## 2019-05-23 NOTE — Progress Notes (Signed)
Patient refused CPAP.

## 2019-05-24 DIAGNOSIS — J9601 Acute respiratory failure with hypoxia: Secondary | ICD-10-CM | POA: Diagnosis not present

## 2019-05-24 LAB — CULTURE, BLOOD (ROUTINE X 2)
Culture: NO GROWTH
Culture: NO GROWTH
Special Requests: ADEQUATE

## 2019-05-24 LAB — RENAL FUNCTION PANEL
Albumin: 2.4 g/dL — ABNORMAL LOW (ref 3.5–5.0)
Anion gap: 19 — ABNORMAL HIGH (ref 5–15)
BUN: 71 mg/dL — ABNORMAL HIGH (ref 6–20)
CO2: 25 mmol/L (ref 22–32)
Calcium: 10.5 mg/dL — ABNORMAL HIGH (ref 8.9–10.3)
Chloride: 91 mmol/L — ABNORMAL LOW (ref 98–111)
Creatinine, Ser: 8.29 mg/dL — ABNORMAL HIGH (ref 0.61–1.24)
GFR calc Af Amer: 8 mL/min — ABNORMAL LOW (ref >60)
GFR calc non Af Amer: 7 mL/min — ABNORMAL LOW (ref >60)
Glucose, Bld: 113 mg/dL — ABNORMAL HIGH (ref 70–99)
Phosphorus: 7.1 mg/dL — ABNORMAL HIGH (ref 2.5–4.6)
Potassium: 3.9 mmol/L (ref 3.5–5.1)
Sodium: 135 mmol/L (ref 135–145)

## 2019-05-24 LAB — SARS CORONAVIRUS 2 (TAT 6-24 HRS): SARS Coronavirus 2: NEGATIVE

## 2019-05-24 MED ORDER — ACETAMINOPHEN 325 MG PO TABS
650.0000 mg | ORAL_TABLET | ORAL | Status: DC
  Administered 2019-05-24 – 2019-06-16 (×99): 650 mg via ORAL
  Filled 2019-05-24 (×100): qty 2

## 2019-05-24 MED ORDER — CHLORHEXIDINE GLUCONATE CLOTH 2 % EX PADS
6.0000 | MEDICATED_PAD | Freq: Every day | CUTANEOUS | Status: DC
  Administered 2019-05-25 – 2019-05-31 (×6): 6 via TOPICAL

## 2019-05-24 NOTE — Progress Notes (Signed)
Family Medicine Teaching Service Daily Progress Note Intern Pager: [phone]  Patient name: Troy Wells Medical record number: [id number] Date of birth: 02-14-1966 Age: 53 y.o. Gender: male  Primary Care Provider: [name], MD Consultants: Neurology, critical care, nephrology Code Status: Full  Pt Overview and Major Events to Date:  04/27/2019 admitted to ICU 05/02/2019 MRI brain showing anoxic brain injury 05/10/2019: Extubated 05/12/2019: Started receiving care under FPTS 05/20/2019: EEG w/o seizure 05/21/2019: Stable for discharge to SNF  Assessment and Plan: Troy Wells is a 53 y.o. male transferred from ICU after cardiac arrest with recent history of COVID-19 infection.  PMH significant for ESRD MWF on HD, OSA, HTN, HLD, HFpEF, pHTN, type 2 diabetes, and diabetic neuropathy.  Encephalopathy  Possible anoxic brain injury  PEA cardiac arrest No events overnight.  Is resting comfortably when I evaluated him this morning.  Continues to show improvement. He is more talkative today says he feels well. Concern for decreased eating yesterday. Encouraged to eat more today. Each morning I have contacted nurse/nurse tech to help set him up to eat breakfast.  -Neuro has signed off. -Appreciate PT/OT working with patient -Social work to assist with placement, CIR preferred with SNF as backup -Continue cardiac monitoring for now, can likely DC soon -Monitor improvement  - PT/OT Eval and treat   COVID (resolved)  Enterobacter HAP  Leukocytosis Patient now greater than 21 days out from Covid diagnosis. Has been cleared by infectious disease. Enterobacter cloacae from tracheal aspirate.  Treated with CTX, per ID no additional treatment needed at this time. Leukocytosis down trending 12.3 12/5.  T-max 100.44F with afebrile temp this morning 98.4. LLL infiltrate on CXR 12/2.  Repeat Covid test on 05/21/2019 is negative. -f/u blood cultures, no growth x4 days -Monitor fever curve,  if fevers again start antibiotics   ESRD on HD  Hypercalcemia  Secondary hyperparathyroidism Dialysis per nephro TTS schedule.  K 3.9, BUN 71, Cr 8.29. Outpatient schedule is TTS.  Patient denied lab work this morning but said that he would get it during the dialysis. -Nephrology following, appreciate recs:  -Daily renal function panel -Aranesp every Tuesday with HD  HFmEF  CAD ECHO on 04/27/19 40 to 45%.  LV with mild to moderate decreased fxn, mildly increased LVH, inferiolateral akinesis and anterolateral severe hypokinesis. Grade 2 diastolic dysfunction.  -Continue aspirin 81 -Continue Lipitor 80 mg daily -Coreg 25 mg twice daily -Torsemide 100 mg daily on dialysis days and Sunday.  Hypertension  Tachycardia Normotensive, BP 126/83.  Not tachycardic this morning.  HR 99 -Continue Coreg 25 mg twice daily  -Continue amlodipine 10 mg daily -Continue to hold home minoxidil 20 mg twice daily  OSA with use of CPAP  Currently on CPAP 97%. OSA at baseline and uses CPAP intermittently.  Now 21+ days out from Covid diagnosis, can use CPAP if needed. -Continue to encourage patient to use CPAP at bedtime  Diabetes Home medications include glipizide 5 mg BID. Last A1c 5.8 on 04/14/2019. Inpatient glucose has been stable. -Continue holding home glipizide  HLD Home medications include atorvastatin 40mg  -Atorvastatin 80mg  daily  Diabetic Neuropathy  Patient on home gabapentin 300 mg TID. Patient reports only taking BID.  -Hold gabapentin 300mg  TID    Gout  Home medications include allopurinol 100mg  daily and colchicine 0.6mg  BID as needed when symptomatic.  -Continue home allopurinol  LUE/sacral ulcer 2/2 to being bed bound in ICU. Showing signs of healing. Wounds covered with clean guaze but sacral wound emitting odor. -  Continue wound care daily, wound care consult for discharge barrier -Continue turning patient  q2h   FEN/GI:  Dysphagia 3 diet PPx: Heparin 5000 units  subq 3 times daily   Disposition: Medically stable to go to SNF. Family meeting yesterday Troy Wells place v. Troy Wells.  Subjective:  He says he is doing ok. No issues. Has not been eating because he doesn't have a tray. Showed patient tray is at bedside and will contact nurse for help with eating.  Objective: Temp:  [98.3 F (36.8 C)-99.1 F (37.3 C)] 98.4 F (36.9 C) (12/07 0408) Pulse Rate:  [90-102] 99 (12/07 0408) Resp:  [15-20] 18 (12/07 0408) BP: (104-136)/(62-91) 126/83 (12/07 0408) SpO2:  [93 %-97 %] 97 % (12/07 0408)  Physical Exam:  General: Appears well, no acute distress. Age appropriate. Lying on right side resting Cardiac: RRR, normal heart sounds, no murmurs Respiratory: CTAB, normal effort Extremities: No edema or cyanosis. Unna boots on. Skin: Warm and dry, right should wound covered with clean guaze. Sacral would covers with clean gauze and tape but continues to be odorous. Neuro: alert and oriented, no focal deficits Psych: normal affect  Laboratory: Recent Labs  Lab 05/20/19 0228 05/21/19 0129 05/22/19 0814  WBC 18.1* 13.7* 12.3*  HGB 8.8* 8.8* 9.0*  HCT 26.2* 26.7* 27.8*  PLT 382 403* 423*   Recent Labs  Lab 05/19/19 2050  05/21/19 0129 05/22/19 0814 05/24/19 0259  NA 135   < > 133* 135 135  K 4.4   < > 3.8 4.6 3.9  CL 89*   < > 93* 93* 91*  CO2 21*   < > 23 23 25   BUN 89*   < > 46* 84* 71*  CREATININE 9.92*   < > 6.38* 9.55* 8.29*  CALCIUM 10.1   < > 9.7 10.3 10.5*  PROT 6.8  --   --   --   --   BILITOT 0.6  --   --   --   --   ALKPHOS 77  --   --   --   --   ALT 28  --   --   --   --   AST 22  --   --   --   --   GLUCOSE 152*   < > 110* 101* 113*   < > = values in this interval not displayed.    Imaging/Diagnostic Tests: Now new imaging  Troy Fee, DO 05/24/2019, 6:59 AM PGY-1, [place] Intern pager: [phone], text pages welcome

## 2019-05-24 NOTE — TOC Progression Note (Signed)
Transition of Care [facility]) - Progression Note    Patient Details  Name: Troy Wells MRN: [id number] Date of Birth: 1965-10-17  Transition of Care [facility]) CM/SW Contact  Sharin Mons, RN Phone Number: 05/24/2019, 10:20 AM  Clinical Narrative:    NCM called daughter to f/u with SNF bed offers. Call unsuccessful, voice message left. Awaiting return call.. TOC team will continue to monitor.  Troy Wells (Daughter)      [phone]        Expected Discharge Plan: Skilled Nursing Facility Barriers to Discharge: Continued Medical Work up  Expected Discharge Plan and Services Expected Discharge Plan: [place] In-house Referral: Clinical Social Work   Post Acute Care Choice: IP Rehab Living arrangements for the past 2 months: Apartment                 DME Arranged: N/A         HH Arranged: NA           Social Determinants of Health (SDOH) Interventions    Readmission Risk Interventions Readmission Risk Prevention Plan 05/17/2019  Transportation Screening Complete  Medication Review [employer]) Complete  PCP or Specialist appointment within 3-5 days of discharge Complete  HRI or Home Care Consult Complete  SW Recovery Care/Counseling Consult Complete  Palliative Care Screening Not [place] Complete  Some recent data might be hidden

## 2019-05-24 NOTE — TOC Progression Note (Addendum)
Transition of Care [facility]) - Progression Note    Patient Details  Name: Troy Wells MRN: [id number] Date of Birth: 1965/09/17  Transition of Care [facility]) CM/SW Contact  Sharin Mons, RN Phone Number: [phone] 05/24/2019, 2:30 PM  Clinical Narrative:    Daughter f/u with NCM regarding SNF bed offers. Daughter selected [facility]. NCM made [facility] aware. Liaison stated will do an eligibility check for bed offer and f/u with NCM.  TOC team will continue to monitor.  05/24/2019 @ [facility] admission liaison inform NCM they should be able accept pt on tomorrow,12/8 if medically ready.  Per nephrologist:  Pt with Sacral Decub-Daily drsg changes. Per primary. Concern this may delay discharge if wound limits his ability to sit in recliner for HD.    Expected Discharge Plan: Skilled Nursing Facility Barriers to Discharge: Other (comment)(Awaiting reponse from [facility] for SNF eligibility)  Expected Discharge Plan and Services Expected Discharge Plan: [place] In-house Referral: Clinical Social Work   Post Acute Care Choice: IP Rehab Living arrangements for the past 2 months: Apartment                 DME Arranged: N/A         HH Arranged: NA           Social Determinants of Health (SDOH) Interventions    Readmission Risk Interventions Readmission Risk Prevention Plan 05/17/2019  Transportation Screening Complete  Medication Review [employer] photographer) Complete  PCP or Specialist appointment within 3-5 days of discharge Complete  HRI or Home Care Consult Complete  SW Recovery Care/Counseling Consult Complete  Palliative Care Screening Not [place] Complete  Some recent data might be hidden

## 2019-05-24 NOTE — Progress Notes (Signed)
Patient refused breakfast despite this writer attempting to feed patient.

## 2019-05-24 NOTE — Consult Note (Addendum)
[place] Nurse wound follow up Requested to reassess left shoulder and sacrum wounds and provide topical treatment recommendations.  Wound type: Sacrum with unstageable pressure injury; refer to previous [place] consult notes on 11/30 and 12/4.  Left shoulder with chronic full thickness wound: 9X5X.2cm, 40% tightly adhered eschar, 60% red and moist.  No odor or draiange, dry scar tissue of darker-colored skin surrounding. Santyl to assist with removal of nonviable tissue.  Measurement: [id number] to sacrum Wound bed: 100% yellow slough tightly adhered over woundbed Drainage (amount, consistency, odor) large amt strong foul odor and tan drainage Dressing procedure/placement/frequency: Pt just completed 3 days of Dakins and the wound has not improved since previous description. Begin hydrotherapy which will be performed by physical therapy and resume Santyl daily to assist with removal of nonviable tissue.  [place] team will reassess sacrum wound weekly along with hydrotherapy and adjust plan of care if necessary at that time.  Julien Girt MSN, RN, [place], [place], [place]

## 2019-05-24 NOTE — Progress Notes (Signed)
This writer attempted to feed patient breakfast.  Patient refused to eat.  Patient drank Nepro well.

## 2019-05-24 NOTE — Progress Notes (Signed)
Went to check back in on patient and daughter was at bedside. He did not eat his breakfast but had a nutrition shake. He says he is not eating because he cannot "get it". I am unsure what he means by this but will continue to encourage nourishment.  Contacted by clinical social work for ability to discharge patient as she found a bed for the patient at [facility].  I discussed with her at this time patient may not be able to discharge due to not being able to sit up for long periods of time for dialysis. Contacted patient's nurse to get him up in chair to do a trial run went to go see patient for 30 minutes of being up in chair and he is in considerable pain.  I scheduled Tylenol at this time to see if he could control his pain and have some improvement with sitting up in the chair.  Wound care nursing is following we will begin hydrotherapy on sacral wound.  Says that he is not eating due to pain.  Will reassess in the morning.  Has asked night team to go see patient and assess pain after scheduled Tylenol.

## 2019-05-24 NOTE — Progress Notes (Signed)
[facility] Progress Note   Subjective: Seen in room. No new complaints. Primary at bedside evaluating sacral wound.  Objective Vitals:   05/23/19 2305 05/24/19 0408 05/24/19 0849 05/24/19 1630  BP: (!) 106/91 126/83 115/67 121/68  Pulse: (!) 101 99 85 92  Resp: 20 18 (!) 24   Temp: 99.1 F (37.3 C) 98.4 F (36.9 C) (!) 97.5 F (36.4 C) 98.1 F (36.7 C)  TempSrc: Oral Axillary Oral Oral  SpO2: 94% 97% 99% 95%  Weight:      Height:       Physical Exam General:Chronically ill appearing male in NAD Heart:S1,S2 RRR Lungs:CTAB Abdomen:Active BS ND Extremities:No LE edema. Heel protectors in place Dialysis Access:L AVF + bruit  Dialysis: [facility]ast 4.5h F250 124.5kg 400/800 2/2.25 Hep 12000 AVF - mircera 30 q4 last 10/13 - hect 4  Summary: Pt is a78 y.o.yo malewith ESRD who was admitted on11/10/2020with resp failure due to COVID/pulm edema and cardiac arrest   Assess/ Plan: 1.HxCOVIDHCAP: S/p abx - off airborne precautions. 2. Pulm edema/volume overload: Resolved.  On nasal cannula oxygen. 3. ESRD: RequiredCRRT 11/18- 11/24- now back on intermittent HD TTS -Next HD12/08 4.Anemia(ESRD + ABLA): S/p1U PRBC11/16 and 11/19. Continue max Aranesp 262mcg weekly. Hgb 8.8. 5.Secondary HPTH/hypercalcemia: Most likely d/t immobilization. VDRAheld- given calcitonin 400mg  BID 11/25-11/26. Follow for now. If rises again, will consider pamidronate. 2Ca bath. Phos sky high(although >30 was error), Illene Regulus pwdr dose to 2.4g TIW.PTH 459 start sensipar 30 mg PO daily and uptitrate. 6. HTN/volume: S/p significant weight loss (~20kg) from admit. UF as tolerated. HD 05/22/19 Net UF 1.3 liter. No wt.  7. PYP:PJKD to be anoxic brain injury - somewhat improved.  8. Hyperkalemia: Resolved. 9. Debility: Will need ongoing PT/OT - per case managers to arrange. 10. ?Seizure:Prev took [place] - held this admit. Seen by neuro. EEG W/O seizure  activity. Neuro signed off.  11. Sacral Decub-Daily drsg changes. Per primary. Concern this may delay discharge if wound limits his ability to sit in recliner for HD.   Kelly Splinter, MD 05/24/2019, 4:33 PM      Additional Objective Labs: Basic Metabolic Panel: Recent Labs  Lab 05/21/19 0129 05/22/19 0814 05/24/19 0259  NA 133* 135 135  K 3.8 4.6 3.9  CL 93* 93* 91*  CO2 23 23 25   GLUCOSE 110* 101* 113*  BUN 46* 84* 71*  CREATININE 6.38* 9.55* 8.29*  CALCIUM 9.7 10.3 10.5*  PHOS 7.5* 10.1* 7.1*   Liver Function Tests: Recent Labs  Lab 05/19/19 2050 05/21/19 0129 05/22/19 0814 05/24/19 0259  AST 22  --   --   --   ALT 28  --   --   --   ALKPHOS 77  --   --   --   BILITOT 0.6  --   --   --   PROT 6.8  --   --   --   ALBUMIN 2.5* 2.4* 2.5* 2.4*   No results for input(s): LIPASE, AMYLASE in the last 168 hours. CBC: Recent Labs  Lab 05/19/19 0939 05/19/19 2050 05/20/19 0228 05/21/19 0129 05/22/19 0814  WBC 17.1* 17.5* 18.1* 13.7* 12.3*  NEUTROABS  --  14.4*  --   --   --   HGB 8.8* 8.2* 8.8* 8.8* 9.0*  HCT 26.4* 24.9* 26.2* 26.7* 27.8*  MCV 85.4 84.1 83.2 85.0 84.5  PLT 394 366 382 403* 423*   Blood Culture    Component Value Date/Time   SDES BLOOD RIGHT HAND 05/19/2019  [place]  05/19/2019 [place] Blood Culture results may not be optimal due to an inadequate volume of blood received in culture bottles   CULT  05/19/2019 1205    NO GROWTH 5 DAYS Performed at [facility], [place] [address] [place], [place] [address]    REPTSTATUS 05/24/2019 FINAL 05/19/2019 1205    Cardiac Enzymes: Recent Labs  Lab 05/19/19 2050  CKTOTAL 39*   CBG: Recent Labs  Lab 05/20/19 0759 05/20/19 1210 05/20/19 1842 05/20/19 2152 05/21/19 0838  GLUCAP 104* 124* 121* 109* 106*   Iron Studies: No results for input(s): IRON, TIBC, TRANSFERRIN, FERRITIN in the last 72 hours. @lablastinr3 @ Studies/Results: No results  found. Medications:  . allopurinol  100 mg Oral Daily  . aspirin  81 mg Oral Daily  . atorvastatin  80 mg Oral Daily  . carvedilol  12.5 mg Oral BID WC  . Chlorhexidine Gluconate Cloth  6 each Topical Q0600  . cinacalcet  30 mg Oral Q supper  . collagenase   Topical Daily  . darbepoetin (ARANESP) injection - DIALYSIS  200 mcg Intravenous Q Tue-HD  . feeding supplement (NEPRO CARB STEADY)  237 mL Oral TID BM  . feeding supplement (PRO-STAT SUGAR FREE 64)  30 mL Oral BID  . heparin injection (subcutaneous)  5,000 Units Subcutaneous Q8H  . multivitamin  1 tablet Oral QHS  . sevelamer carbonate  2.4 g Oral TID WC

## 2019-05-25 DIAGNOSIS — J9601 Acute respiratory failure with hypoxia: Secondary | ICD-10-CM | POA: Diagnosis not present

## 2019-05-25 LAB — CBC
HCT: 24.9 % — ABNORMAL LOW (ref 39.0–52.0)
Hemoglobin: 8.1 g/dL — ABNORMAL LOW (ref 13.0–17.0)
MCH: 27.1 pg (ref 26.0–34.0)
MCHC: 32.5 g/dL (ref 30.0–36.0)
MCV: 83.3 fL (ref 80.0–100.0)
Platelets: 457 10*3/uL — ABNORMAL HIGH (ref 150–400)
RBC: 2.99 MIL/uL — ABNORMAL LOW (ref 4.22–5.81)
RDW: 16.6 % — ABNORMAL HIGH (ref 11.5–15.5)
WBC: 10.6 10*3/uL — ABNORMAL HIGH (ref 4.0–10.5)
nRBC: 0 % (ref 0.0–0.2)

## 2019-05-25 LAB — RENAL FUNCTION PANEL
Albumin: 2.4 g/dL — ABNORMAL LOW (ref 3.5–5.0)
Anion gap: 21 — ABNORMAL HIGH (ref 5–15)
BUN: 97 mg/dL — ABNORMAL HIGH (ref 6–20)
CO2: 24 mmol/L (ref 22–32)
Calcium: 10.5 mg/dL — ABNORMAL HIGH (ref 8.9–10.3)
Chloride: 91 mmol/L — ABNORMAL LOW (ref 98–111)
Creatinine, Ser: 10.54 mg/dL — ABNORMAL HIGH (ref 0.61–1.24)
GFR calc Af Amer: 6 mL/min — ABNORMAL LOW (ref >60)
GFR calc non Af Amer: 5 mL/min — ABNORMAL LOW (ref >60)
Glucose, Bld: 108 mg/dL — ABNORMAL HIGH (ref 70–99)
Phosphorus: 7.1 mg/dL — ABNORMAL HIGH (ref 2.5–4.6)
Potassium: 4.3 mmol/L (ref 3.5–5.1)
Sodium: 136 mmol/L (ref 135–145)

## 2019-05-25 MED ORDER — PRO-STAT SUGAR FREE PO LIQD
30.0000 mL | Freq: Three times a day (TID) | ORAL | Status: DC
  Administered 2019-05-25 – 2019-06-16 (×55): 30 mL via ORAL
  Filled 2019-05-25 (×57): qty 30

## 2019-05-25 MED ORDER — JUVEN PO PACK
1.0000 | PACK | Freq: Two times a day (BID) | ORAL | Status: DC
  Administered 2019-05-26 – 2019-06-16 (×33): 1 via ORAL
  Filled 2019-05-25 (×39): qty 1

## 2019-05-25 MED ORDER — HEPARIN SODIUM (PORCINE) 1000 UNIT/ML DIALYSIS
8000.0000 [IU] | Freq: Once | INTRAMUSCULAR | Status: AC
  Administered 2019-05-25: 13:00:00 8000 [IU] via INTRAVENOUS_CENTRAL

## 2019-05-25 MED ORDER — HEPARIN SODIUM (PORCINE) 1000 UNIT/ML IJ SOLN
INTRAMUSCULAR | Status: AC
  Administered 2019-05-25: 8000 [IU] via INTRAVENOUS_CENTRAL
  Filled 2019-05-25: qty 8

## 2019-05-25 MED ORDER — DARBEPOETIN ALFA 200 MCG/0.4ML IJ SOSY
PREFILLED_SYRINGE | INTRAMUSCULAR | Status: AC
  Administered 2019-05-25: 200 ug via INTRAVENOUS
  Filled 2019-05-25: qty 0.4

## 2019-05-25 MED ORDER — OXYCODONE HCL 5 MG PO TABS
5.0000 mg | ORAL_TABLET | Freq: Two times a day (BID) | ORAL | Status: DC | PRN
  Administered 2019-05-25 – 2019-05-27 (×3): 5 mg via ORAL
  Filled 2019-05-25 (×3): qty 1

## 2019-05-25 NOTE — Progress Notes (Signed)
Family Medicine Teaching Service Daily Progress Note Intern Pager: [phone]  Patient name: Troy Wells Medical record number: [id number] Date of birth: 08-22-1965 Age: 53 y.o. Gender: male  Primary Care Provider: [name], MD Consultants: Neurology, critical care, nephrology Code Status: Full  Pt Overview and Major Events to Date:  04/27/2019 admitted to ICU 05/02/2019 MRI brain showing anoxic brain injury 05/10/2019: Extubated 05/12/2019: Started receiving care under FPTS 05/20/2019: EEG w/o seizure 05/21/2019: Stable for discharge to SNF  Assessment and Plan: Troy Wells is a 53 y.o. male transferred from ICU after cardiac arrest with recent history of COVID-19 infection.  PMH significant for ESRD MWF on HD, OSA, HTN, HLD, HFpEF, pHTN, type 2 diabetes, and diabetic neuropathy.  Encephalopathy  Possible anoxic brain injury  PEA cardiac arrest No events overnight. Appears to have plateau with improvement for now. But says he is doing well today and he is not in distress.  -Neuro has signed off. -Appreciate PT/OT working with patient -Social work to assist with placement, CIR preferred with SNF as backup -Monitor improvement  - PT/OT Eval and treat -Consider GOC with family today  COVID (resolved)  Enterobacter HAP  Leukocytosis Patient now greater than 21 days out from Covid diagnosis. Has been cleared by infectious disease. Enterobacter cloacae from tracheal aspirate.  Treated with CTX, per ID no additional treatment needed at this time. Leukocytosis down trending 12.3 12/5.  T-max 100.27F with afebrile temp this morning 98.7. LLL infiltrate on CXR 12/2.  Repeat Covid test on 05/21/2019 is negative. -f/u blood cultures, no growth x5 days -Monitor fever curve, if fevers again start antibiotics   ESRD on HD  Hypercalcemia  Secondary hyperparathyroidism Dialysis per nephro TTS schedule.  K 4.3, BUN 97, Cr 10.54. Outpatient schedule is TTS.   -Nephrology  following, appreciate recs:  -Daily renal function panel -Aranesp every Tuesday with HD  HFmEF  CAD ECHO on 04/27/19 40 to 45%.  LV with mild to moderate decreased fxn, mildly increased LVH, inferiolateral akinesis and anterolateral severe hypokinesis. Grade 2 diastolic dysfunction.  -Continue aspirin 81 -Continue Lipitor 80 mg daily -Coreg 25 mg twice daily -Torsemide 100 mg daily on dialysis days and Sunday.  Hypertension  Tachycardia Normotensive, BP 120/90.  Not tachycardic this morning.  HR 90 -Continue Coreg 25 mg twice daily  -Continue amlodipine 10 mg daily -Continue to hold home minoxidil 20 mg twice daily  OSA with use of CPAP  Currently on RA 91%. OSA at baseline and uses CPAP intermittently.  Now 21+ days out from Covid diagnosis, can use CPAP if needed. -Continue to encourage patient to use CPAP at bedtime  Diabetes Home medications include glipizide 5 mg BID. Last A1c 5.8 on 04/14/2019. Inpatient glucose has been stable. 108 on BMP. -Continue holding home glipizide  HLD Home medications include atorvastatin 40mg  -Atorvastatin 80mg  daily  Diabetic Neuropathy  Patient on home gabapentin 300 mg TID. Patient reports only taking BID.  -Hold gabapentin 300mg  TID    Gout  Home medications include allopurinol 100mg  daily and colchicine 0.6mg  BID as needed when symptomatic.  -Continue home allopurinol  LUE/sacral ulcer 2/2 to being bed bound in ICU. Wounds covered with clean guaze but sacral wound emitting odor. Discussed the need to sit up in chair for discharge and he agreed to try. Will attempt to get a picture following HD today. -Up to chair and sit as tolerated -Continue wound care daily; begin hydrotherapy -Continue turning patient  q2h more on the right side due to  left shoulder wond  FEN/GI:  Dysphagia 3 diet PPx: Heparin 5000 units subq 3 times daily   [facility] patient need to be able to sit up in chair  3-4 hrs for HD; this appears to be a discharge barrier at this time.   Subjective:  He is doing well. No discomfort at this time. Says he has not been eating because of pain. Said he did not refuse his bath yesterday and would like a bath today.  Objective: Temp:  [97.5 F (36.4 C)-98.7 F (37.1 C)] 98.7 F (37.1 C) (12/07 2332) Pulse Rate:  [84-92] 84 (12/08 0151) Resp:  [18-24] 18 (12/08 0151) BP: (115-121)/(67-90) 120/90 (12/07 2332) SpO2:  [91 %-99 %] 94 % (12/08 0151)  Physical Exam:  General: Appears well, no acute distress. Age appropriate. Lying on right side in bed.  Cardiac: RRR, normal heart sounds, no murmurs Respiratory: CTAB, normal effort Extremities: No edema or cyanosis. Skin: Warm and dry, wounds with clean gauze in place. Neuro: alert and oriented, general weakness Psych: normal affect    Laboratory: Recent Labs  Lab 05/20/19 0228 05/21/19 0129 05/22/19 0814  WBC 18.1* 13.7* 12.3*  HGB 8.8* 8.8* 9.0*  HCT 26.2* 26.7* 27.8*  PLT 382 403* 423*   Recent Labs  Lab 05/19/19 2050  05/22/19 0814 05/24/19 0259 05/25/19 0257  NA 135   < > 135 135 136  K 4.4   < > 4.6 3.9 4.3  CL 89*   < > 93* 91* 91*  CO2 21*   < > 23 25 24   BUN 89*   < > 84* 71* 97*  CREATININE 9.92*   < > 9.55* 8.29* 10.54*  CALCIUM 10.1   < > 10.3 10.5* 10.5*  PROT 6.8  --   --   --   --   BILITOT 0.6  --   --   --   --   ALKPHOS 77  --   --   --   --   ALT 28  --   --   --   --   AST 22  --   --   --   --   GLUCOSE 152*   < > 101* 113* 108*   < > = values in this interval not displayed.    Imaging/Diagnostic Tests: Now new imaging  Gerlene Fee, DO 05/25/2019, 6:53 AM PGY-1, [place] Intern pager: [phone], text pages welcome

## 2019-05-25 NOTE — Progress Notes (Signed)
FPTS Interim Progress Note  S: Spoke with CSW about continuing to have Mr. Hashemi sit up in the chair to tolerate outpatient HD. She voiced understanding. As of now Mr. Keir has a [facility].  A/P: Will continue to encourage nursing to get Mr. Falkner up in the chair as tolerated to reach the goal of 3-4 hours sitting for patient to get HD outpatient.  Gerlene Fee, DO 05/25/2019, 3:10 PM PGY-1, [place] Medicine Service pager [phone]

## 2019-05-25 NOTE — Progress Notes (Addendum)
Signed out by day team to review pt's pain. RN was trying to change his position due to his sacral wound and finding it difficult to engage with pt. Pt was able to tell me he was in pain and that " I need something" but unable to tell me where or tell me more about the pain. Spoke to his RN who felt the tylenol on his chart is sufficient for now. Did not prescribe stronger analgesia.   Lattie Haw PGY1, [place]

## 2019-05-25 NOTE — Progress Notes (Signed)
PT Cancellation Note  Patient Details Name: Troy Wells MRN: [id number] DOB: [birth year]   Cancelled Treatment:    Reason Eval/Treat Not Completed: Patient at procedure or test/unavailable. Attempted to see pt at 10am and pt was undergoing hydrotherapy. PT then returned at 12 noon and pt off floor at HD. PT to return as able to progress mobility.  Kittie Plater, PT, DPT Acute Rehabilitation Services Pager #: [phone] Office #: [phone]    Berline Lopes 05/25/2019, 12:09 PM

## 2019-05-25 NOTE — Progress Notes (Signed)
Patient placed on CPAP and doing well at this time. Will call if anything changes and if decides he doesn't want to wear anymore.

## 2019-05-25 NOTE — Progress Notes (Signed)
Nutrition Follow-up  DOCUMENTATION CODES:   Obesity unspecified  INTERVENTION:   If poor PO intake continues, recommend considering Cortrak NG tube placement and initiation of enteral nutrition. Pt with increased kcal and protein needs related to recent COVID-19 as well as pressure injury and ESRD on HD.  In order for pt to meet estimated needs, will need to eat meals plus protein supplements.  -Nepro Shake po TID, each supplement provides 425 kcal and 19 grams protein -Increase Prostat liquid protein PO 30 ml TID with meals, each supplement provides 100 kcal, 15 grams protein. -Add Juven Fruit Punch BID, each serving provides 95kcal and 2.5g of protein (amino acids glutamine and arginine) -Rena-vit daily  NUTRITION DIAGNOSIS:   Increased nutrient needs related to acute illness(COVID-19) as evidenced by estimated needs.  Ongoing.  GOAL:   Patient will meet greater than or equal to 90% of their needs  Progressing.  MONITOR:   PO intake, Supplement acceptance, Diet advancement, Labs, Weight trends, Skin, I & O's  ASSESSMENT:   53 yo male admitted in PEA arrest after a syncopal episode at HD center. Intubated on admission. PMH includes recent COVID PNA (positive on 10/28), HTN, CAD, ESRD on HD, HLD, OSA, DM-2.  11/18-11/24: CRRT 11/23 - extubated 11/25 - diet advanced to dysphagia 3 with thin liquids  **RD working remotely**  Patient currently in HD at this time.  Last documented PO was on 12/3, 60% of breakfast. Pt has been in pain which is limiting his PO intake.  Patient has been drinking Nepro and Prostat supplements. Will increase Prostat and add Juven supplements to aid in wound healing. Per MD note, pt is stable for discharge and has a SNF bed. Barrier is if he can sit in chair for 3-4 hours for HD.  Admission weight: 261 lbs. Current weight: 207 lbs.   Medications: Rena-vit tablet  Labs reviewed: Phos 7.1  Diet Order:   Diet Order            DIET DYS 3  Room service appropriate? Yes with Assist; Fluid consistency: Thin  Diet effective now              EDUCATION NEEDS:   Not appropriate for education at this time  Skin:  Skin Assessment: Skin Integrity Issues: Skin Integrity Issues:: Stage II Unstageable: sacrum  Last BM:  12/7 -type 4  Height:   Ht Readings from Last 1 Encounters:  05/01/19 5\' 10"  (1.778 m)    Weight:   Wt Readings from Last 1 Encounters:  05/25/19 94.2 kg    Ideal Body Weight:  75.5 kg  BMI:  Body mass index is 29.8 kg/m.  Estimated Nutritional Needs:   Kcal:  6389-3734  Protein:  120-140 gm  Fluid:  1 L + UOP   Clayton Bibles, MS, RD, LDN Inpatient Clinical Dietitian Pager: [phone] After Hours Pager: [phone]

## 2019-05-25 NOTE — Progress Notes (Signed)
[facility] Progress Note   Subjective: Seen in room. No c/o.   Objective Vitals:   05/24/19 1630 05/24/19 2332 05/25/19 0151 05/25/19 0741  BP: 121/68 120/90  125/82  Pulse: 92 90 84 64  Resp:  18 18 18   Temp: 98.1 F (36.7 C) 98.7 F (37.1 C)  (!) 97.5 F (36.4 C)  TempSrc: Oral Oral  Oral  SpO2: 95% 91% 94% 99%  Weight:      Height:       Physical Exam General:Chronically ill appearing male in NAD Heart:S1,S2 RRR Lungs:CTAB Abdomen:Active BS ND Extremities:No LE edema. Heel protectors in place Dialysis Access:L AVF + bruit  Dialysis: [facility] 4.5h F250 124.5kg 400/800 2/2.25 Hep 12000 AVF - mircera 30 q4 last 10/13 - hect 4  Summary: Pt is a59 y.o.yo malewith ESRD who was admitted on11/10/2020with resp failure due to COVID/pulm edema and cardiac arrest   Assess/ Plan: 1.HxCOVIDHCAP: S/p abx - off airborne precautions. 2. Vol overload: down 22 kg, resolved, on room air.  3. ESRD: RequiredCRRT 11/18- 11/24. Now on intermittent HD TTS -Next HD today 4.Anemia(ESRD + ABLA): S/p1U PRBC11/16 and 11/19. Continue max Aranesp 258mcg weekly. Hgb 8.8. 5.Secondary HPTH/hypercalcemia: Most likely d/t immobilization. VDRAheld- given calcitonin 11/25-11/26. Follow for now. If rises again, will consider pamidronate. 2Ca bath. Phos sky high(although >30 was error), Illene Regulus pwdr dose to 2.4g TIW.PTH 459 start sensipar 30 mg PO daily and uptitrate. 6. SJG:GEZM to be ABI, stable, communicates well but poor memory 7. Debility: Will need ongoing PT/OT - per case managers to arrange 8. ?Seizure:prev took [place] - held this admit. Seen by neuro. EEG W/O seizure activity. Neuro signed off.  9. Sacral Decub-Daily drsg changes. Per primary. Concern this may delay discharge if wound limits his ability to sit in recliner for HD.   Kelly Splinter, MD 05/24/2019, 4:33 PM      Additional Objective Labs: Basic Metabolic Panel: Recent  Labs  Lab 05/22/19 0814 05/24/19 0259 05/25/19 0257  NA 135 135 136  K 4.6 3.9 4.3  CL 93* 91* 91*  CO2 23 25 24   GLUCOSE 101* 113* 108*  BUN 84* 71* 97*  CREATININE 9.55* 8.29* 10.54*  CALCIUM 10.3 10.5* 10.5*  PHOS 10.1* 7.1* 7.1*   Liver Function Tests: Recent Labs  Lab 05/19/19 2050  05/22/19 0814 05/24/19 0259 05/25/19 0257  AST 22  --   --   --   --   ALT 28  --   --   --   --   ALKPHOS 77  --   --   --   --   BILITOT 0.6  --   --   --   --   PROT 6.8  --   --   --   --   ALBUMIN 2.5*   < > 2.5* 2.4* 2.4*   < > = values in this interval not displayed.   No results for input(s): LIPASE, AMYLASE in the last 168 hours. CBC: Recent Labs  Lab 05/19/19 0939 05/19/19 2050 05/20/19 0228 05/21/19 0129 05/22/19 0814  WBC 17.1* 17.5* 18.1* 13.7* 12.3*  NEUTROABS  --  14.4*  --   --   --   HGB 8.8* 8.2* 8.8* 8.8* 9.0*  HCT 26.4* 24.9* 26.2* 26.7* 27.8*  MCV 85.4 84.1 83.2 85.0 84.5  PLT 394 366 382 403* 423*   Blood Culture    Component Value Date/Time   SDES BLOOD RIGHT HAND 05/19/2019 1205   SPECREQUEST  05/19/2019  [place] Blood Culture results may not be optimal due to an inadequate volume of blood received in culture bottles   CULT  05/19/2019 1205    NO GROWTH 5 DAYS Performed at [facility], [place] [address] [place], [place] [address]    REPTSTATUS 05/24/2019 FINAL 05/19/2019 1205    Cardiac Enzymes: Recent Labs  Lab 05/19/19 2050  CKTOTAL 39*   CBG: Recent Labs  Lab 05/20/19 0759 05/20/19 1210 05/20/19 1842 05/20/19 2152 05/21/19 0838  GLUCAP 104* 124* 121* 109* 106*   Iron Studies: No results for input(s): IRON, TIBC, TRANSFERRIN, FERRITIN in the last 72 hours. @lablastinr3 @ Studies/Results: No results found. Medications:  . acetaminophen  650 mg Oral Q4H  . allopurinol  100 mg Oral Daily  . aspirin  81 mg Oral Daily  . atorvastatin  80 mg Oral Daily  . carvedilol  12.5 mg Oral BID WC  .  Chlorhexidine Gluconate Cloth  6 each Topical Q0600  . cinacalcet  30 mg Oral Q supper  . collagenase   Topical Daily  . darbepoetin (ARANESP) injection - DIALYSIS  200 mcg Intravenous Q Tue-HD  . feeding supplement (NEPRO CARB STEADY)  237 mL Oral TID BM  . feeding supplement (PRO-STAT SUGAR FREE 64)  30 mL Oral BID  . heparin injection (subcutaneous)  5,000 Units Subcutaneous Q8H  . multivitamin  1 tablet Oral QHS  . sevelamer carbonate  2.4 g Oral TID WC

## 2019-05-25 NOTE — Progress Notes (Signed)
Physical Therapy Wound Treatment Patient Details  Name: Troy Wells MRN: [id number] Date of Birth: [birth year]  Today's Date: 05/25/2019 Time: 1025-1109 Time Calculation (min): 44 min  Subjective  Subjective: Pt responding appropriately at times however appears confused at other times. Not following commands consistently. Patient and Family Stated Goals: None stated Prior Treatments: Dakin's and dressing changes  Pain Score:  Very painful, especially with debridement. Premedicated with Tylenol. Paged Teaching Service regarding possibility of a stronger pain medication for future hydrotherapy sessions.   Wound Assessment  Pressure Injury 05/05/19 Sacrum Medial Unstageable - Full thickness tissue loss in which the base of the ulcer is covered by slough (yellow, tan, gray, green or brown) and/or eschar (tan, brown or black) in the wound bed. Fluid filled blister on righ sac (Active)  Wound Image   05/25/19 1242  Dressing Type ABD;Barrier Film (skin prep);Gauze (Comment);Moist to dry 05/25/19 1242  Dressing Changed;Clean;Dry;Intact 05/25/19 1242  Dressing Change Frequency Daily 05/25/19 1242  State of Healing Eschar 05/25/19 1242  Site / Wound Assessment Granulation tissue;Yellow 05/25/19 1242  % Wound base Red or Granulating 35% 05/25/19 1242  % Wound base Yellow/Fibrinous Exudate 65% 05/25/19 1242  % Wound base Black/Eschar 0% 05/25/19 1242  % Wound base Other/Granulation Tissue (Comment) 0% 05/25/19 1242  Peri-wound Assessment Intact 05/25/19 1242  Wound Length (cm) 7.4 cm 05/25/19 1000  Wound Width (cm) 5.2 cm 05/25/19 1000  Wound Depth (cm) 3.2 cm 05/25/19 1000  Wound Surface Area (cm^2) 38.48 cm^2 05/25/19 1000  Wound Volume (cm^3) 123.14 cm^3 05/25/19 1000  Undermining (cm) 1.8 cm from 12:00-5:00 05/25/19 1242  Margins Unattached edges (unapproximated) 05/25/19 1242  Drainage Amount Moderate 05/25/19 1242  Drainage Description Purulent;Odor 05/25/19 1242  Treatment  Debridement (Selective);Hydrotherapy (Pulse lavage);Packing (Saline gauze) 05/25/19 1242   Santyl applied to wound bed prior to applying dressing.     Hydrotherapy Pulsed lavage therapy - wound location: Sacrum Pulsed Lavage with Suction (psi): 8 psi(4 at times due to pain) Pulsed Lavage with Suction - Normal Saline Used: 1000 mL Pulsed Lavage Tip: Tip with splash shield Selective Debridement Selective Debridement - Location: Sacrum Selective Debridement - Tools Used: Forceps;Scissors Selective Debridement - Tissue Removed: Yellow necrotic tissue   Wound Assessment and Plan  Wound Therapy - Assess/Plan/Recommendations Wound Therapy - Clinical Statement: Pt presents to hydrotherapy after 3 day trial of Dakin's Solution. Pt very painful and debridement somewhat limited due to pain. Appears to be very close to bone at base of the wound. This patient will benefit from continued hydrotherapy for continued selective removal of unviable tissue, to decrease bioburden, and promote wound bed healing.  Wound Therapy - Functional Problem List: Decreased tolerance for OOB and positioning due to decreased skin integrity.  Factors Delaying/Impairing Wound Healing: Diabetes Mellitus;Incontinence;Immobility;Multiple medical problems Hydrotherapy Plan: Debridement;Dressing change;Patient/family education;Pulsatile lavage with suction Wound Therapy - Frequency: 6X / week Wound Therapy - Follow Up Recommendations: Skilled nursing facility Wound Plan: See above  Wound Therapy Goals- Improve the function of patient's integumentary system by progressing the wound(s) through the phases of wound healing (inflammation - proliferation - remodeling) by: Decrease Necrotic Tissue to: 0 Decrease Necrotic Tissue - Progress: Goal set today Increase Granulation Tissue to: 100% Increase Granulation Tissue - Progress: Goal set today Improve Drainage Characteristics: Min;Serous Improve Drainage Characteristics - Progress:  Goal set today Goals/treatment plan/discharge plan were made with and agreed upon by patient/family: Yes Time For Goal Achievement: 7 days Wound Therapy - Potential for Goals: Good  Goals will be updated  until maximal potential achieved or discharge criteria met.  Discharge criteria: when goals achieved, discharge from hospital, MD decision/surgical intervention, no progress towards goals, refusal/missing three consecutive treatments without notification or medical reason.  GP     Thelma Comp 05/25/2019, 1:22 PM   Rolinda Roan, PT, DPT Acute Rehabilitation Services Pager: [phone] Office: [phone]

## 2019-05-25 NOTE — Progress Notes (Signed)
FPTS Interim Progress Note  S: Spoke with daughter and plans to visit tomorrow. Voiced understanding about being on the same page with her father's discharge.   A/P: Likely need a discussion about future plans and what to expect moving forward. [facility] meeting @2pm  05/26/2019  Gerlene Fee, DO 05/25/2019, 2:53 PM PGY-1, [place] Medicine Service pager [phone]

## 2019-05-25 NOTE — Progress Notes (Signed)
Pt c/o sacral pain after getting back up to unit from HD.  Pt bp  Low 96/63.  When asked patient what pain level was, pt did not respond and closed his eyes.  Holding PRN Oxycodone until pt more alert & bp increases.   MD paged.

## 2019-05-25 NOTE — Progress Notes (Signed)
OT Cancellation Note  Patient Details Name: Kebin Maye MRN: [id number] DOB: [birth year]   Cancelled Treatment:    Reason Eval/Treat Not Completed: Patient at procedure or test/ unavailable Attmpted to see early this morning, was receiving hydrotherapy. Attempted to return this afternoon and pt at HS. Will continue to follow as available and appropriate.  Zenovia Jarred, MSOT, OTR/L Behavioral Health OT/ Acute Relief OT [facility] Office: [place] 05/25/2019, 12:53 PM

## 2019-05-26 DIAGNOSIS — R092 Respiratory arrest: Secondary | ICD-10-CM

## 2019-05-26 LAB — RENAL FUNCTION PANEL
Albumin: 2.6 g/dL — ABNORMAL LOW (ref 3.5–5.0)
Anion gap: 17 — ABNORMAL HIGH (ref 5–15)
BUN: 62 mg/dL — ABNORMAL HIGH (ref 6–20)
CO2: 24 mmol/L (ref 22–32)
Calcium: 9.7 mg/dL (ref 8.9–10.3)
Chloride: 94 mmol/L — ABNORMAL LOW (ref 98–111)
Creatinine, Ser: 7.41 mg/dL — ABNORMAL HIGH (ref 0.61–1.24)
GFR calc Af Amer: 9 mL/min — ABNORMAL LOW (ref >60)
GFR calc non Af Amer: 8 mL/min — ABNORMAL LOW (ref >60)
Glucose, Bld: 135 mg/dL — ABNORMAL HIGH (ref 70–99)
Phosphorus: 5.1 mg/dL — ABNORMAL HIGH (ref 2.5–4.6)
Potassium: 4 mmol/L (ref 3.5–5.1)
Sodium: 135 mmol/L (ref 135–145)

## 2019-05-26 MED ORDER — CHLORHEXIDINE GLUCONATE CLOTH 2 % EX PADS: 6.0000 | MEDICATED_PAD | Freq: Every day | CUTANEOUS | Status: DC

## 2019-05-26 NOTE — Progress Notes (Signed)
VAST consulted to place PIV. Pt currently has no IV meds or fluids ordered. Contacted Chambliss, MD and asked if IV access could be left out until needed. He stated "yes". Notified pt's nurse.

## 2019-05-26 NOTE — Progress Notes (Addendum)
Family Medicine Teaching Service Daily Progress Note Intern Pager: [phone]  Patient name: Troy Wells Medical record number: [id number] Date of birth: 07-17-1965 Age: 53 y.o. Gender: male  Primary Care Provider: [name], MD Consultants: Neurology, critical care, nephrology Code Status: Full  Pt Overview and Major Events to Date:  04/27/2019 admitted to ICU 05/02/2019 MRI brain showing anoxic brain injury 05/10/2019: Extubated 05/12/2019: Started receiving care under FPTS 05/20/2019: EEG w/o seizure 05/21/2019: Stable for discharge to SNF  Assessment and Plan: Troy Wells is a 53 y.o. male transferred from ICU after cardiac arrest with recent history of COVID-19 infection.  PMH significant for ESRD MWF on HD, OSA, HTN, HLD, HFpEF, pHTN, type 2 diabetes, and diabetic neuropathy.  Encephalopathy  Possible anoxic brain injury  PEA cardiac arrest No events overnight. Appears to have plateau with improvement for now. But says he is doing well today and he is not in distress. Patient is not eating has been evaluated remotely by RD and NG tube was recommended. -Neuro has signed off. -Appreciate PT/OT working with patient -Social work to assist with placement, CIR preferred with SNF as backup -Monitor improvement  - PT/OT Eval and treat -Consider GOC with family today  COVID (resolved)  Enterobacter HAP  Leukocytosis Patient now greater than 21 days out from Covid diagnosis. Has been cleared by infectious disease. Enterobacter cloacae from tracheal aspirate.  Treated with CTX, per ID no additional treatment needed at this time. Leukocytosis down trending 10.5 12/8.  T-max 100.84F with afebrile temp this morning 98.7. LLL infiltrate on CXR 12/2.  Repeat Covid test on 05/24/2019 is negative. Blood cultures, no growth x5 days -Monitor fever curve, if fevers again start antibiotics   ESRD on HD  Hypercalcemia  Secondary hyperparathyroidism Dialysis per nephro TTS schedule.   K 4.0, BUN 62, Cr 7.41 Outpatient schedule is TTS.   -Nephrology following, appreciate recs:  -Daily renal function panel -Aranesp every Tuesday with HD  HFmEF  CAD ECHO on 04/27/19 40 to 45%.  LV with mild to moderate decreased fxn, mildly increased LVH, inferiolateral akinesis and anterolateral severe hypokinesis. Grade 2 diastolic dysfunction.  -Continue aspirin 81 -Continue Lipitor 80 mg daily -Coreg 25 mg twice daily -Torsemide 100 mg daily on dialysis days and Sunday.  Hypertension  Tachycardia Normotensive, BP 131/65.  Not tachycardic this morning.  HR 82 -Continue Coreg 25 mg twice daily  -Continue amlodipine 10 mg daily -Continue to hold home minoxidil 20 mg twice daily  OSA with use of CPAP  Currently on CPAP  97%. OSA at baseline and uses CPAP intermittently.  Now 21+ days out from Covid diagnosis, can use CPAP if needed. -Continue to encourage patient to use CPAP at bedtime  Diabetes Home medications include glipizide 5 mg BID. Last A1c 5.8 on 04/14/2019. Inpatient glucose has been stable.  135 on BMP. -Continue holding home glipizide  HLD Home medications include atorvastatin 40mg  -Atorvastatin 80mg  daily  Diabetic Neuropathy  Patient on home gabapentin 300 mg TID. Patient reports only taking BID.  -Hold gabapentin 300mg  TID    Gout  Home medications include allopurinol 100mg  daily and colchicine 0.6mg  BID as needed when symptomatic.  -Continue home allopurinol  LUE/sacral ulcer 2/2 to being bed bound in ICU. Wounds covered with clean guaze but sacral wound emitting odor. Discussed the need to sit up in chair for discharge and he agreed to try.  -Up to chair and sit as tolerated -Continue wound care daily; begin hydrotherapy -Continue turning patient  q2h  more on the right side due to left shoulder wond  Social/Disposition -Bed waiting at [facility] family meeting @2pm  today  FEN/GI:  Dysphagia 3 diet PPx: Heparin 5000 units subq 3 times  daily   Disposition: Bed found at [facility] place; patient need to be able to sit up in chair 3-4 hrs for HD; this appears to be a discharge barrier at this time.   Subjective:  He says he is doing ok today.  Objective: Temp:  [97.5 F (36.4 C)-98.6 F (37 C)] 97.6 F (36.4 C) (12/08 2337) Pulse Rate:  [64-95] 82 (12/08 2337) Resp:  [16-20] 20 (12/08 2257) BP: (77-131)/(38-82) 131/65 (12/08 2337) SpO2:  [95 %-99 %] 97 % (12/08 2337) Weight:  [91.5 kg-94.2 kg] 91.5 kg (12/08 1606)  Physical Exam:  General: Appears well, no acute distress. Age appropriate. Lying in bed on his right side Cardiac: RRR, normal heart sounds, no murmurs Respiratory: CTAB, normal effort Extremities: No edema or cyanosis. Skin: Warm and dry, sacral and shoulder wound covered with clean guaze Neuro: alert and oriented, no focal deficits Psych: normal affect   Laboratory: Recent Labs  Lab 05/21/19 0129 05/22/19 0814 05/25/19 1253  WBC 13.7* 12.3* 10.6*  HGB 8.8* 9.0* 8.1*  HCT 26.7* 27.8* 24.9*  PLT 403* 423* 457*   Recent Labs  Lab 05/19/19 2050  05/24/19 0259 05/25/19 0257 05/26/19 0307  NA 135   < > 135 136 135  K 4.4   < > 3.9 4.3 4.0  CL 89*   < > 91* 91* 94*  CO2 21*   < > 25 24 24   BUN 89*   < > 71* 97* 62*  CREATININE 9.92*   < > 8.29* 10.54* 7.41*  CALCIUM 10.1   < > 10.5* 10.5* 9.7  PROT 6.8  --   --   --   --   BILITOT 0.6  --   --   --   --   ALKPHOS 77  --   --   --   --   ALT 28  --   --   --   --   AST 22  --   --   --   --   GLUCOSE 152*   < > 113* 108* 135*   < > = values in this interval not displayed.    Imaging/Diagnostic Tests: Now new imaging  Gerlene Fee, DO 05/26/2019, 6:43 AM PGY-1, [place] Intern pager: [phone], text pages welcome

## 2019-05-26 NOTE — Progress Notes (Signed)
Physical Therapy Treatment Patient Details Name: Prem Coykendall MRN: [id number] DOB: 08-17-1965 Today's Date: 05/26/2019    History of Present Illness 53 yo male presenting to [facility] with cardiac arrest and CPR initatied by EMS. Pt at dialysis, complained of SOB, and became unresponsive. Pt with prior hospital admission (10/27 - 11/3) for COVID associated pneumonia; active problems of acute hypoxemic respiratory failure NSTEMI during admission. Intubated 11/10-11/23. MRI showing bilateral medial thalami is suspicious for anoxic injury and chronic R PICA cerebellar infarct.  Transitioned off of CRRT 05/11/19, femoral line removed 05/13/19.PMH including ESRD on HD, chronic anemia, chronic systolic heart failure, CAD, HTN, OSA on CPAP, DM2, and HLD.    PT Comments    Worked with OT today with goal to get pt out of bed and engaged in therapy session. Pt with flat affect and depressed mood. Pt with delayed response time and impaired ability to sequence. Pt con't to require modAx2 for bed mobility and maxAx2 with sit to stand. Used stedy today to bring pt to sink to perform ADLs. Pt required modA at trunk to maintain upright position to use bilat UEs to perform ADLs with OT. Pt did have a BM upon PT arrival without acknowledgement. Pt dependent for hygiene. Pt appreciative of our servces. Acute PT to cont to follow.   Follow Up Recommendations  SNF;Supervision/Assistance - 24 hour     Equipment Recommendations  Hospital bed;Other (comment)    Recommendations for Other Services Rehab consult     Precautions / Restrictions Precautions Precautions: Fall Precaution Comments: flat affect, minimal memory Restrictions Weight Bearing Restrictions: No    Mobility  Bed Mobility Overal bed mobility: Needs Assistance Bed Mobility: Rolling;Sidelying to Sit;Sit to Supine Rolling: Mod assist(max directional verbal cues) Sidelying to sit: Mod assist;+2 for physical assistance   Sit to supine: Max  assist;+2 for physical assistance   General bed mobility comments: pt requiring max directional verbal and tactile cues to complete task, minimal initiation on own but did help once PT/OT initiated  Transfers Overall transfer level: Needs assistance Equipment used: Ambulation equipment used Transfers: Sit to/from Stand Sit to Stand: Max assist;+2 physical assistance         General transfer comment: used the stedy today, completed 2 sit to stand transfers, used bed pad under hips to assist with hip extension and powering up  Ambulation/Gait             General Gait Details: unable   Stairs             Wheelchair Mobility    Modified Rankin (Stroke Patients Only) Modified Rankin (Stroke Patients Only) Pre-Morbid Rankin Score: No symptoms Modified Rankin: Severe disability     Balance Overall balance assessment: Needs assistance Sitting-balance support: Bilateral upper extremity supported Sitting balance-Leahy Scale: Poor Sitting balance - Comments: dependent on UE support to maintain EOB sitting   Standing balance support: Bilateral upper extremity supported Standing balance-Leahy Scale: Poor Standing balance comment: reliant on physical assist and stedy                            Cognition Arousal/Alertness: Awake/alert Behavior During Therapy: Flat affect Overall Cognitive Status: Impaired/Different from baseline Area of Impairment: Attention;Memory;Following commands;Safety/judgement;Awareness;Problem solving                 Orientation Level: Disoriented to;Time;Situation Current Attention Level: Sustained Memory: Decreased short-term memory Following Commands: Follows one step commands with increased time   Awareness: Intellectual  Problem Solving: Slow processing;Decreased initiation;Difficulty sequencing;Requires verbal cues;Requires tactile cues General Comments: pt more alert today however with flat affect, pt participate with  encouragement however had no recollection of session      Exercises      General Comments General comments (skin integrity, edema, etc.): VSS, using stedy pt brought to sink to work with OT on ADLs, PT provided truncal support and attempted to engage core to maintain upright posture during ADLs      Pertinent Vitals/Pain Pain Assessment: Faces Faces Pain Scale: Hurts even more Pain Location: low back and sacrum Pain Descriptors / Indicators: Grimacing;Guarding;Moaning Pain Intervention(s): Monitored during session    Home Living                      Prior Function            PT Goals (current goals can now be found in the care plan section) Progress towards PT goals: Progressing toward goals    Frequency    Min 3X/week      PT Plan Current plan remains appropriate    Co-evaluation PT/OT/SLP Co-Evaluation/Treatment: Yes Reason for Co-Treatment: Complexity of the patient's impairments (multi-system involvement);For patient/therapist safety PT goals addressed during session: Mobility/safety with mobility        AM-PAC PT "6 Clicks" Mobility   Outcome Measure  Help needed turning from your back to your side while in a flat bed without using bedrails?: A Lot Help needed moving from lying on your back to sitting on the side of a flat bed without using bedrails?: A Lot Help needed moving to and from a bed to a chair (including a wheelchair)?: A Lot Help needed standing up from a chair using your arms (e.g., wheelchair or bedside chair)?: A Lot Help needed to walk in hospital room?: Total Help needed climbing 3-5 steps with a railing? : Total 6 Click Score: 10    End of Session   Activity Tolerance: Patient tolerated treatment well Patient left: in bed;with call bell/phone within reach;with bed alarm set Nurse Communication: Mobility status PT Visit Diagnosis: Other abnormalities of gait and mobility (R26.89);Muscle weakness (generalized)  (M62.81);Difficulty in walking, not elsewhere classified (R26.2);Pain Pain - part of body: (sacrum ,low back)     Time: 7001-7494 PT Time Calculation (min) (ACUTE ONLY): 29 min  Charges:  $Therapeutic Activity: 8-22 mins                     Kittie Plater, PT, DPT Acute Rehabilitation Services Pager #: [phone] Office #: [phone]    Berline Lopes 05/26/2019, 12:20 PM

## 2019-05-26 NOTE — TOC Progression Note (Signed)
Transition of Care [facility]) - Progression Note    Patient Details  Name: Troy Wells MRN: [id number] Date of Birth: May 04, 1966  Transition [facility]) CM/SW Contact  Sharin Mons, RN Phone Number: 05/26/2019, 3:37 PM  Clinical Narrative:    Per admission liaison [facility] will be able to receive pt on tomorrow, 05/27/2019. MD and pt's daughter made aware.  Expected Discharge Plan: Skilled Nursing Facility Barriers to Discharge: Other (comment)(per [facility] bed will be available 05/27/2019)  Expected Discharge Plan and Services Expected Discharge Plan: [place] In-house Referral: Clinical Social Work   Post Acute Care Choice: IP Rehab Living arrangements for the past 2 months: Apartment                 DME Arranged: N/A         HH Arranged: NA           Social Determinants of Health (SDOH) Interventions    Readmission Risk Interventions Readmission Risk Prevention Plan 05/17/2019  Transportation Screening Complete  Medication Review Press photographer) Complete  PCP or Specialist appointment within 3-5 days of discharge Complete  HRI or Home Care Consult Complete  SW Recovery Care/Counseling Consult Complete  Palliative Care Screening Not [place] Complete  Some recent data might be hidden

## 2019-05-26 NOTE — Progress Notes (Signed)
Physical Therapy Wound Treatment Patient Details  Name: Troy Wells MRN: [id number] Date of Birth: Jul 01, 1965  Today's Date: 05/26/2019 Time: 5329-9242 Time Calculation (min): 34 min  Subjective  Subjective: Pt responding appropriately at times however with delayed responses. Does not remember PT/OT coming in to work with him shortly before hydrotherapy arrived.  Patient and Family Stated Goals: None stated Prior Treatments: Dakin's and dressing changes  Pain Score: Premedicated. Tolerated better with new pain meds however still very painful throughout treatment.   Wound Assessment  Pressure Injury 05/05/19 Sacrum Medial Unstageable - Full thickness tissue loss in which the base of the ulcer is covered by slough (yellow, tan, gray, green or brown) and/or eschar (tan, brown or black) in the wound bed. Fluid filled blister on righ sac (Active)  Dressing Type ABD;Gauze (Comment);Moist to dry 05/26/19 1131  Dressing Changed;Clean;Dry;Intact 05/26/19 1131  Dressing Change Frequency Daily 05/26/19 1131  State of Healing Early/partial granulation 05/26/19 1131  Site / Wound Assessment Red;Yellow 05/26/19 1131  % Wound base Red or Granulating 50% 05/26/19 1131  % Wound base Yellow/Fibrinous Exudate 50% 05/26/19 1131  % Wound base Black/Eschar 0% 05/26/19 1131  % Wound base Other/Granulation Tissue (Comment) 0% 05/26/19 1131  Peri-wound Assessment Intact 05/26/19 1131  Wound Length (cm) 7.4 cm 05/25/19 1000  Wound Width (cm) 5.2 cm 05/25/19 1000  Wound Depth (cm) 3.2 cm 05/25/19 1000  Wound Surface Area (cm^2) 38.48 cm^2 05/25/19 1000  Wound Volume (cm^3) 123.14 cm^3 05/25/19 1000  Undermining (cm) 1.8 cm from 12:00-5:00 05/25/19 1242  Margins Unattached edges (unapproximated) 05/26/19 1131  Drainage Amount Copious 05/26/19 1131  Drainage Description Purulent;Odor 05/26/19 1131  Treatment Debridement (Selective);Hydrotherapy (Pulse lavage);Packing (Saline gauze) 05/26/19 1131   Santyl  applied to wound bed prior to applying dressing.     Hydrotherapy Pulsed lavage therapy - wound location: Sacrum Pulsed Lavage with Suction (psi): 8 psi(4 at times due to pain) Pulsed Lavage with Suction - Normal Saline Used: 1000 mL Pulsed Lavage Tip: Tip with splash shield Selective Debridement Selective Debridement - Location: Sacrum Selective Debridement - Tools Used: Forceps;Scissors Selective Debridement - Tissue Removed: Yellow necrotic tissue   Wound Assessment and Plan  Wound Therapy - Assess/Plan/Recommendations Wound Therapy - Clinical Statement: Wound appears significantly improved this session with areas of bright red granulation tisue present.  Wound Therapy - Functional Problem List: Decreased tolerance for OOB and positioning due to decreased skin integrity.  Factors Delaying/Impairing Wound Healing: Diabetes Mellitus;Incontinence;Immobility;Multiple medical problems Hydrotherapy Plan: Debridement;Dressing change;Patient/family education;Pulsatile lavage with suction Wound Therapy - Frequency: 6X / week Wound Therapy - Follow Up Recommendations: Skilled nursing facility Wound Plan: See above  Wound Therapy Goals- Improve the function of patient's integumentary system by progressing the wound(s) through the phases of wound healing (inflammation - proliferation - remodeling) by: Decrease Necrotic Tissue to: 0 Decrease Necrotic Tissue - Progress: Progressing toward goal Increase Granulation Tissue to: 100% Increase Granulation Tissue - Progress: Progressing toward goal Improve Drainage Characteristics: Min;Serous Goals/treatment plan/discharge plan were made with and agreed upon by patient/family: Yes Time For Goal Achievement: 7 days Wound Therapy - Potential for Goals: Good  Goals will be updated until maximal potential achieved or discharge criteria met.  Discharge criteria: when goals achieved, discharge from hospital, MD decision/surgical intervention, no progress  towards goals, refusal/missing three consecutive treatments without notification or medical reason.  GP     Thelma Comp 05/26/2019, 11:45 AM   Rolinda Roan, PT, DPT Acute Rehabilitation Services Pager: [phone] Office: [phone]

## 2019-05-26 NOTE — Progress Notes (Signed)
[facility] Progress Note   Subjective: Seen in room. No c/o.   Objective Vitals:   05/25/19 1652 05/25/19 2257 05/25/19 2337 05/26/19 0735  BP: 96/63  131/65 130/77  Pulse: 83 80 82 90  Resp:  20    Temp:   97.6 F (36.4 C) 98.9 F (37.2 C)  TempSrc:   Axillary Axillary  SpO2: 98% 98% 97% 99%  Weight:      Height:       Physical Exam General:Chronically ill appearing male in NAD Heart:S1,S2 RRR Lungs:CTAB Abdomen:Active BS ND Extremities:No LE edema. Heel protectors in place Dialysis Access:L AVF + bruit  Dialysis: [facility]ast 4.5h F250 124.5kg 400/800 2/2.25 Hep 12000 AVF - mircera 30 q4 last 10/13 - hect 4  Summary: Pt is a7 y.o.yo malewith ESRD who was admitted on11/10/2020with resp failure due to COVID/pulm edema and cardiac arrest   Assess/ Plan: 1.HxCOVIDHCAP: S/p abx - off airborne precautions. 2. Vol overload: down 22 kg, resolved, on room air.  3. ESRD: RequiredCRRT 11/18- 11/24. Now on reg HD TTS. HD tomorrow.  4.Anemia(ESRD + ABLA): S/p1U PRBC11/16 and 11/19. Continue max Aranesp 268mcg weekly. Hgb 8.8. 5.Secondary HPTH/hypercalcemia: Most likely d/t immobilization. VDRAheld- given calcitonin 11/25-11/26. Follow for now. If rises again, will consider pamidronate. 2Ca bath. Phos sky high(although >30 was error), Illene Regulus pwdr dose to 2.4g TIW.PTH 459 start sensipar 30 mg PO daily and uptitrate. 6. NOM:VEHM to be ABI, stable, communicates well but poor memory 7. Debility: Will need ongoing PT/OT - per case managers to arrange 8. ?Seizure:prev took [place] - held this admit. Seen by neuro. EEG W/O seizure activity. Neuro signed off.  9. Sacral Decub-Daily drsg changes. Per primary. Concern this may delay discharge if wound limits his ability to sit in recliner for HD.   Kelly Splinter, MD 05/24/2019, 4:33 PM      Additional Objective Labs: Basic Metabolic Panel: Recent Labs  Lab 05/24/19 0259  05/25/19 0257 05/26/19 0307  NA 135 136 135  K 3.9 4.3 4.0  CL 91* 91* 94*  CO2 25 24 24   GLUCOSE 113* 108* 135*  BUN 71* 97* 62*  CREATININE 8.29* 10.54* 7.41*  CALCIUM 10.5* 10.5* 9.7  PHOS 7.1* 7.1* 5.1*   Liver Function Tests: Recent Labs  Lab 05/19/19 2050  05/24/19 0259 05/25/19 0257 05/26/19 0307  AST 22  --   --   --   --   ALT 28  --   --   --   --   ALKPHOS 77  --   --   --   --   BILITOT 0.6  --   --   --   --   PROT 6.8  --   --   --   --   ALBUMIN 2.5*   < > 2.4* 2.4* 2.6*   < > = values in this interval not displayed.   No results for input(s): LIPASE, AMYLASE in the last 168 hours. CBC: Recent Labs  Lab 05/19/19 2050 05/20/19 0228 05/21/19 0129 05/22/19 0814 05/25/19 1253  WBC 17.5* 18.1* 13.7* 12.3* 10.6*  NEUTROABS 14.4*  --   --   --   --   HGB 8.2* 8.8* 8.8* 9.0* 8.1*  HCT 24.9* 26.2* 26.7* 27.8* 24.9*  MCV 84.1 83.2 85.0 84.5 83.3  PLT 366 382 403* 423* 457*   Blood Culture    Component Value Date/Time   SDES BLOOD RIGHT HAND 05/19/2019 1205   SPECREQUEST  05/19/2019 1205  BOTTLES DRAWN AEROBIC ONLY Blood Culture results may not be optimal due to an inadequate volume of blood received in culture bottles   CULT  05/19/2019 1205    NO GROWTH 5 DAYS Performed at [facility], [place] [address], [place], [place] [address]    REPTSTATUS 05/24/2019 FINAL 05/19/2019 1205    Cardiac Enzymes: Recent Labs  Lab 05/19/19 2050  CKTOTAL 39*   CBG: Recent Labs  Lab 05/20/19 0759 05/20/19 1210 05/20/19 1842 05/20/19 2152 05/21/19 0838  GLUCAP 104* 124* 121* 109* 106*   Iron Studies: No results for input(s): IRON, TIBC, TRANSFERRIN, FERRITIN in the last 72 hours. @lablastinr3 @ Studies/Results: No results found. Medications:  . acetaminophen  650 mg Oral Q4H  . allopurinol  100 mg Oral Daily  . aspirin  81 mg Oral Daily  . atorvastatin  80 mg Oral Daily  . carvedilol  12.5 mg Oral BID WC  . Chlorhexidine Gluconate Cloth  6  each Topical Q0600  . cinacalcet  30 mg Oral Q supper  . collagenase   Topical Daily  . darbepoetin (ARANESP) injection - DIALYSIS  200 mcg Intravenous Q Tue-HD  . feeding supplement (NEPRO CARB STEADY)  237 mL Oral TID BM  . feeding supplement (PRO-STAT SUGAR FREE 64)  30 mL Oral TID  . heparin injection (subcutaneous)  5,000 Units Subcutaneous Q8H  . multivitamin  1 tablet Oral QHS  . nutrition supplement (JUVEN)  1 packet Oral BID BM  . sevelamer carbonate  2.4 g Oral TID WC

## 2019-05-26 NOTE — Progress Notes (Signed)
FPTS Interim Progress Note  Patient nurse that patient's daughter in room and requesting to speak with provider.  Presented to bedside.  Spoke with daughter regarding goals of care for her father.  She noted that her understanding of his condition was not he would "take small baby steps, and might never get back to where he was, but also might.  It just seems like all of the doctors are not sure."  Advised that this is true, will likely be a long recovery, and that he likely will not return to his baseline given his anoxic brain injury.  She voiced understanding of this.  Brought up goals of care discussion including CODE STATUS.  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  Explained to her that this can be a continued discussion, that Dr. Janus Molder will reassess the patient in the morning.  Encouraged [place] to get her father into a chair with the nurses while she is here, so that she can see how he does while he is sitting up.  Plan to have Dr. Marylene Buerger seek with her in the morning about possible discharge pending his ability to remain seated today.  [place], DO 05/26/2019, 6:02 PM PGY-2, [facility] pager [phone]

## 2019-05-26 NOTE — Progress Notes (Addendum)
RT spoke with patient about wearing CPAP and he stated he wasn't going to wear tonight. Patient's family member was at bedside and this RT explained if patient changes his mind that he will be placed on CPAP.  Patient in no distress with vitals  WNL.

## 2019-05-26 NOTE — Progress Notes (Signed)
Occupational Therapy Treatment Patient Details Name: Troy Wells MRN: [id number] DOB: [birth year] Today's Date: 05/26/2019    History of present illness 53 yo male presenting to [facility] with cardiac arrest and CPR initatied by EMS. Pt at dialysis, complained of SOB, and became unresponsive. Pt with prior hospital admission (10/27 - 11/3) for COVID associated pneumonia; active problems of acute hypoxemic respiratory failure NSTEMI during admission. Intubated 11/10-11/23. MRI showing bilateral medial thalami is suspicious for anoxic injury and chronic R PICA cerebellar infarct.  Transitioned off of CRRT 05/11/19, femoral line removed 05/13/19.PMH including ESRD on HD, chronic anemia, chronic systolic heart failure, CAD, HTN, OSA on CPAP, DM2, and HLD.   OT comments  Pt making progress towards occupational therapy goals this session. Pt incontinent of BM upon entering and needing total A for hygiene while in side lying position.  Pt able to tolerate sit <>stand in stedy with mod A of 2 this session. Pt assisted to sink via stedy and able to complete grooming tasks with assist for upright positioning/support. Pt does fatigue very quickly but is making excellent gains. Pt returned to bed at end of session with all needs within reach.   Follow Up Recommendations  SNF;Supervision/Assistance - 24 hour    Equipment Recommendations  Other (comment)(defer to next venue of care)       Precautions / Restrictions Precautions Precautions: Fall Precaution Comments: flat affect, minimal memory Restrictions Weight Bearing Restrictions: No       Mobility Bed Mobility Overal bed mobility: Needs Assistance Bed Mobility: Rolling;Sidelying to Sit;Sit to Supine Rolling: Mod assist Sidelying to sit: Mod assist;+2 for physical assistance   Sit to supine: Max assist;+2 for physical assistance   General bed mobility comments: max multimodal cuing to complete tasks  Transfers Overall transfer level: Needs  assistance Equipment used: Ambulation equipment used Transfers: Sit to/from Stand Sit to Stand: Max assist;+2 physical assistance         General transfer comment: used the stedy today, completed 2 sit to stand transfers, used bed pad under hips to assist with hip extension and powering up    Balance Overall balance assessment: Needs assistance Sitting-balance support: Bilateral upper extremity supported Sitting balance-Leahy Scale: Poor Sitting balance - Comments: dependent on UE support to maintain EOB sitting   Standing balance support: Bilateral upper extremity supported Standing balance-Leahy Scale: Poor Standing balance comment: reliant on physical assist and stedy                           ADL either performed or assessed with clinical judgement   ADL Overall ADL's : Needs assistance/impaired     Grooming: Wash/dry face;Sitting;Wash/dry hands;Oral care;Set up;Moderate assistance Grooming Details (indicate cue type and reason): Pt in stedy at sink for self care tasks . Pt needing set up A to obtain all needed items. Pt needing assistance for upright position secondary to fatigue while in stedy                      Cognition Arousal/Alertness: Awake/alert Behavior During Therapy: Flat affect Overall Cognitive Status: Impaired/Different from baseline Area of Impairment: Attention;Memory;Following commands;Safety/judgement;Awareness;Problem solving        Orientation Level: Disoriented to;Time;Situation Current Attention Level: Sustained Memory: Decreased short-term memory Following Commands: Follows one step commands with increased time   Awareness: Intellectual Problem Solving: Slow processing;Decreased initiation;Difficulty sequencing;Requires verbal cues;Requires tactile cues General Comments: pt more alert today however with flat affect, pt participate with encouragement  however had no recollection of session              General Comments  VSS, using stedy pt brought to sink to work with OT on ADLs, PT provided truncal support and attempted to engage core to maintain upright posture during ADLs    Pertinent Vitals/ Pain       Pain Assessment: Faces Faces Pain Scale: Hurts even more Pain Location: low back and sacrum Pain Descriptors / Indicators: Grimacing;Guarding;Moaning Pain Intervention(s): Monitored during session;Repositioned         Frequency  Min 2X/week        Progress Toward Goals  OT Goals(current goals can now be found in the care plan section)  Progress towards OT goals: Progressing toward goals  Acute Rehab OT Goals Patient Stated Goal: to get stronger OT Goal Formulation: With patient Time For Goal Achievement: 06/09/19 Potential to Achieve Goals: Good  Plan Discharge plan needs to be updated    Co-evaluation    PT/OT/SLP Co-Evaluation/Treatment: Yes Reason for Co-Treatment: Complexity of the patient's impairments (multi-system involvement);For patient/therapist safety PT goals addressed during session: Mobility/safety with mobility OT goals addressed during session: ADL's and self-care      AM-PAC OT "6 Clicks" Daily Activity     Outcome Measure   Help from another person eating meals?: A Lot Help from another person taking care of personal grooming?: A Little Help from another person toileting, which includes using toliet, bedpan, or urinal?: A Lot Help from another person bathing (including washing, rinsing, drying)?: A Lot Help from another person to put on and taking off regular upper body clothing?: A Lot Help from another person to put on and taking off regular lower body clothing?: A Lot 6 Click Score: 13    End of Session    OT Visit Diagnosis: Unsteadiness on feet (R26.81);Other abnormalities of gait and mobility (R26.89);Muscle weakness (generalized) (M62.81)   Activity Tolerance Patient tolerated treatment well   Patient Left in bed;with call bell/phone within  reach;with bed alarm set   Nurse Communication Mobility status(discussed need for air mattress)        Time: 3419-6222 OT Time Calculation (min): 26 min  Charges: OT General Charges $OT Visit: 1 Visit OT Treatments $Self Care/Home Management : 8-22 mins   Darleen Crocker P MS, OTR/L 05/26/2019, 1:15 PM

## 2019-05-27 DIAGNOSIS — R5381 Other malaise: Secondary | ICD-10-CM

## 2019-05-27 LAB — RENAL FUNCTION PANEL
Albumin: 2.6 g/dL — ABNORMAL LOW (ref 3.5–5.0)
Anion gap: 18 — ABNORMAL HIGH (ref 5–15)
BUN: 104 mg/dL — ABNORMAL HIGH (ref 6–20)
CO2: 22 mmol/L (ref 22–32)
Calcium: 10.4 mg/dL — ABNORMAL HIGH (ref 8.9–10.3)
Chloride: 93 mmol/L — ABNORMAL LOW (ref 98–111)
Creatinine, Ser: 9.65 mg/dL — ABNORMAL HIGH (ref 0.61–1.24)
GFR calc Af Amer: 6 mL/min — ABNORMAL LOW (ref >60)
GFR calc non Af Amer: 5 mL/min — ABNORMAL LOW (ref >60)
Glucose, Bld: 109 mg/dL — ABNORMAL HIGH (ref 70–99)
Phosphorus: 5.7 mg/dL — ABNORMAL HIGH (ref 2.5–4.6)
Potassium: 4.3 mmol/L (ref 3.5–5.1)
Sodium: 133 mmol/L — ABNORMAL LOW (ref 135–145)

## 2019-05-27 LAB — GLUCOSE, CAPILLARY
Glucose-Capillary: 103 mg/dL — ABNORMAL HIGH (ref 70–99)
Glucose-Capillary: 103 mg/dL — ABNORMAL HIGH (ref 70–99)
Glucose-Capillary: 117 mg/dL — ABNORMAL HIGH (ref 70–99)

## 2019-05-27 LAB — CBC
HCT: 27.9 % — ABNORMAL LOW (ref 39.0–52.0)
Hemoglobin: 8.8 g/dL — ABNORMAL LOW (ref 13.0–17.0)
MCH: 26.7 pg (ref 26.0–34.0)
MCHC: 31.5 g/dL (ref 30.0–36.0)
MCV: 84.8 fL (ref 80.0–100.0)
Platelets: 488 10*3/uL — ABNORMAL HIGH (ref 150–400)
RBC: 3.29 MIL/uL — ABNORMAL LOW (ref 4.22–5.81)
RDW: 16.6 % — ABNORMAL HIGH (ref 11.5–15.5)
WBC: 10 10*3/uL (ref 4.0–10.5)
nRBC: 0 % (ref 0.0–0.2)

## 2019-05-27 MED ORDER — HEPARIN SODIUM (PORCINE) 1000 UNIT/ML IJ SOLN
INTRAMUSCULAR | Status: AC
  Filled 2019-05-27: qty 8

## 2019-05-27 MED ORDER — OXYCODONE HCL 5 MG PO TABS
7.5000 mg | ORAL_TABLET | Freq: Two times a day (BID) | ORAL | Status: DC | PRN
  Administered 2019-05-28 – 2019-06-02 (×5): 7.5 mg via ORAL
  Filled 2019-05-27 (×6): qty 2

## 2019-05-27 MED ORDER — HEPARIN SODIUM (PORCINE) 1000 UNIT/ML IJ SOLN
8000.0000 [IU] | Freq: Once | INTRAMUSCULAR | Status: AC
  Administered 2019-05-27: 8000 [IU] via INTRAVENOUS

## 2019-05-27 NOTE — Progress Notes (Signed)
Physical Therapy Wound Treatment Patient Details  Name: Troy Wells MRN: [id number] Date of Birth: 01-Mar-1966  Today's Date: 05/27/2019 Time: 1012-1049 Time Calculation (min): 37 min  Subjective  Subjective: Pt asked tx to stop several times due to intolerable pain.   Patient and Family Stated Goals: None stated Prior Treatments: Dakin's and dressing changes  Pain Score:  10/10 with any touch to wound bed during hydro tx.    Wound Assessment  Pressure Injury 05/05/19 Sacrum Medial Unstageable - Full thickness tissue loss in which the base of the ulcer is covered by slough (yellow, tan, gray, green or brown) and/or eschar (tan, brown or black) in the wound bed. Fluid filled blister on righ sac (Active)  Dressing Type ABD;Gauze (Comment);Moist to dry 05/27/19 1059  Dressing Changed;Clean;Dry;Intact 05/27/19 1059  Dressing Change Frequency Daily 05/27/19 1059  State of Healing Early/partial granulation 05/27/19 1059  Site / Wound Assessment Red;Yellow 05/27/19 1059  % Wound base Red or Granulating 50% 05/27/19 1059  % Wound base Yellow/Fibrinous Exudate 50% 05/27/19 1059  % Wound base Black/Eschar 0% 05/27/19 1059  % Wound base Other/Granulation Tissue (Comment) 0% 05/27/19 1059  Peri-wound Assessment Intact 05/27/19 1059  Wound Length (cm) 7.4 cm 05/25/19 1000  Wound Width (cm) 5.2 cm 05/25/19 1000  Wound Depth (cm) 3.2 cm 05/25/19 1000  Wound Surface Area (cm^2) 38.48 cm^2 05/25/19 1000  Wound Volume (cm^3) 123.14 cm^3 05/25/19 1000  Undermining (cm) 1.8 cm from 12:00-5:00 05/25/19 1242  Margins Unattached edges (unapproximated) 05/27/19 1059  Drainage Amount Moderate 05/27/19 1059  Drainage Description Purulent;Odor 05/27/19 1059  Treatment Cleansed;Debridement (Selective) 05/27/19 1059     Santyl applied to wound bed prior to applying dressing.  Hydrotherapy Pulsed lavage therapy - wound location: Sacrum Pulsed Lavage with Suction (psi): 12 psi(held fan tip away from  wound bed due to pain.  ) Pulsed Lavage with Suction - Normal Saline Used: 1000 mL Pulsed Lavage Tip: Tip with splash shield Selective Debridement Selective Debridement - Location: Sacrum Selective Debridement - Tools Used: Forceps;Scissors Selective Debridement - Tissue Removed: Yellow necrotic tissue   Wound Assessment and Plan  Wound Therapy - Assess/Plan/Recommendations Wound Therapy - Clinical Statement: Wound bed continues to appear very clean with minimal  areas of necrosis in wound bed and on L lateral perwound.  Pt tolerating tx very poorly due to pain.  PTA held fan tip away from the wound bed and increased PSI to 12 as he did not tolerate fan tip touching the wound bed at lower PSI.  Informed nurse and mD of need for increased pain meds for further tx, to be able to debride necrotic tissue.   Wound Therapy - Functional Problem List: Decreased tolerance for OOB and positioning due to decreased skin integrity.  Factors Delaying/Impairing Wound Healing: Diabetes Mellitus;Incontinence;Immobility;Multiple medical problems Hydrotherapy Plan: Debridement;Dressing change;Patient/family education;Pulsatile lavage with suction Wound Therapy - Frequency: 6X / week Wound Therapy - Follow Up Recommendations: Skilled nursing facility Wound Plan: See above  Wound Therapy Goals- Improve the function of patient's integumentary system by progressing the wound(s) through the phases of wound healing (inflammation - proliferation - remodeling) by: Decrease Necrotic Tissue to: 0 Decrease Necrotic Tissue - Progress: Progressing toward goal Increase Granulation Tissue to: 100% Increase Granulation Tissue - Progress: Progressing toward goal Improve Drainage Characteristics: Min;Serous Improve Drainage Characteristics - Progress: Progressing toward goal Goals/treatment plan/discharge plan were made with and agreed upon by patient/family: Yes Time For Goal Achievement: 7 days Wound Therapy - Potential  for Goals: Good  Goals will be updated until maximal potential achieved or discharge criteria met.  Discharge criteria: when goals achieved, discharge from hospital, MD decision/surgical intervention, no progress towards goals, refusal/missing three consecutive treatments without notification or medical reason.  GP     Devine Dant Eli Hose 05/27/2019, 11:06 AM  Erasmo Leventhal , PTA Acute Rehabilitation Services Pager [phone] Office [phone]

## 2019-05-27 NOTE — Progress Notes (Addendum)
FPTS Interim Progress Note  S: Spoke with daughter, Marijo Conception. I allowed her to voice her concerns about discharging her father too early and she is unsure about [facility] [facility] [employer]   She feels as if he remains too weak and is not eating enough. Discussed that, with continued PT at [facility] (which is available there) there is room for improvement but is not a discharge barrier. He was seen by dietician who recommended NG tube but it is possible with 1 on 1 feeding assistance he could improve in that area. She did acknowledge he likes applesauce and nutrition shakes.   She also has concern that he will not receive adequate wound care and feels as if he has been receiving exceptional wound care here. I charged N[place]ica to call [facility] and ask about wound care policies.   She wants him to continue to progress before he is discharged. We discussed that there is room for progression but that Mr. Natzke has been at a plateau over the last week and could continue in this direction or show some improvement with continued improvement with working with PT but will be very slow going due to the brain injury and could wax and wane.   Her next concern was how he would be transferred to facility given his weakness. Informed nautica that transfer details will be put in place by social work and would be a safe discharge according to Mr. Wynn' needs. We have been working with him to get him sitting up in the chair as tolerated. Today he did not do so well attempting to sit up in the chair. She says she is the only child and does not want to her father at a facility where she cannot visit as she can visit here. I voiced understanding in this as well. Asked her to reach out to the facility to see what visitation policies are but also keep in mind that this is the way of the world right now to keep our high risk population safe. I have also asked her to reach out to the social worker and speak to her for further help with  her concerns about the facility. The number was provided.    A/P: Mr. Antenucci needs continued nutrition assistance, PT, HD, and over all assistance with ADLs. He will continue to receive that with [place] and we will attempt to improvement his strength and health while he is with [place]. Will continue to talk with family and social work with shared decision making.    Gerlene Fee, DO 05/27/2019, 1:24 PM PGY-1, [place] Medicine Service pager [phone]

## 2019-05-27 NOTE — Plan of Care (Signed)

## 2019-05-27 NOTE — Progress Notes (Addendum)
Family Medicine Teaching Service Daily Progress Note Intern Pager: [phone]  Patient name: Troy Wells Medical record number: [id number] Date of birth: 1965-10-14 Age: 53 y.o. Gender: male  Primary Care Provider: [name], MD Consultants: Neurology, critical care, nephrology Code Status: Full  Pt Overview and Major Events to Date:  04/27/2019 admitted to ICU 05/02/2019 MRI brain showing anoxic brain injury 05/10/2019: Extubated 05/12/2019: Started receiving care under FPTS 05/20/2019: EEG w/o seizure, Neuro signed off 05/21/2019: Stable for discharge to SNF  Assessment and Plan: Troy Wells is a 53 y.o. male transferred from ICU after cardiac arrest with recent history of COVID-19 infection.  PMH significant for ESRD MWF on HD, OSA, HTN, HLD, HFpEF, pHTN, type 2 diabetes, and diabetic neuropathy.  Encephalopathy  Possible anoxic brain injury  PEA cardiac arrest No events overnight. Appears to have plateau with improvement for now. Is agreeable with plan to go to SNF with rehab. -Appreciate PT/OT working with patient -Monitor improvement   COVID (resolved)  Enterobacter HAP  Leukocytosis Patient now greater than 21 days out from Covid diagnosis. Has been cleared by infectious disease. WBC 10 12/10.  T-max 100.55F with afebrile temp this morning. LLL infiltrate on CXR 12/2.  Repeat Covid test on 05/24/2019 is negative.  -Monitor fever curve, if fevers again start antibiotics   ESRD on HD  Hypercalcemia  Secondary hyperparathyroidism Dialysis per nephro TTS schedule.  K 4.3, BUN 104, Cr 9.65 Outpatient schedule is TTS.   -Nephrology following, appreciate recs:  -Daily renal function panel -Aranesp every Tuesday with HD  HFmEF  CAD ECHO on 04/27/19 40 to 45%.  LV with mild to moderate decreased fxn, mildly increased LVH, inferiolateral akinesis and anterolateral severe hypokinesis. Grade 2 diastolic dysfunction.  -Continue aspirin 81 -Continue Lipitor 80 mg  daily -Coreg 25 mg twice daily -Torsemide 100 mg daily on dialysis days and Sunday.  Hypertension  Tachycardia Normotensive, BP 120/65. Not tachycardic this morning. HR 78. -Continue Coreg 25 mg twice daily  -Continue amlodipine 10 mg daily -Continue to hold home minoxidil 20 mg twice daily  OSA with use of CPAP  Currently on RA 96%. OSA at baseline and uses CPAP intermittently. Now 21+ days out from Covid diagnosis, can use CPAP if needed. -Continue to encourage patient to use CPAP at bedtime -Determine if need right away as facility will need a couple of days to get supplies in.  Diabetes Home medications include glipizide 5 mg BID. Last A1c 5.8 on 04/14/2019. Inpatient glucose has been stable.  109 on BMP. -Continue holding home glipizide  HLD Home medications include atorvastatin 40mg  -Atorvastatin 80mg  daily  Diabetic Neuropathy  Patient on home gabapentin 300 mg TID. Patient reports only taking BID.  -Hold gabapentin 300mg  TID    Gout  Home medications include allopurinol 100mg  daily and colchicine 0.6mg  BID as needed when symptomatic.  -Continue home allopurinol  LUE/sacral ulcer 2/2 to being bed bound in ICU. Wounds covered with clean guaze but sacral wound emitting odor. Discussed the need to sit up in chair for discharge and he agreed to try. Could not sit up today.  -Up to chair and sit as tolerated -Continue wound care daily; begin hydrotherapy -Continue turning patient  q2h more on the right side due to left shoulder wond  Social/Disposition -Bed waiting at [facility]  FEN/GI:  Dysphagia 3 diet PPx: Heparin 5000 units subq 3 times daily   Disposition: Bed available at [facility] place; could not tolerate sitting up today  Subjective:  He  is doing ok. No discomfort at this time. Says we dont need to mess with whats not broke and is agreeable to continuing PT.  Objective: Temp:  [98.2 F (36.8 C)-98.4 F (36.9 C)] 98.4 F (36.9 C) (12/09  2300) Pulse Rate:  [78-85] 78 (12/09 2300) Resp:  [19] 19 (12/09 2300) BP: (119-120)/(65-78) 120/65 (12/09 2300) SpO2:  [96 %-97 %] 96 % (12/09 2300)  Physical Exam:  General: Appears well, no acute distress. Age appropriate. Lying in bed on left side Cardiac: RRR, normal heart sounds, no murmurs Respiratory: CTAB, normal effort Extremities: No edema or cyanosis. Skin: Warm and dry, wound covered with clean guaze no odor Neuro: alert and oriented, no focal deficits Psych: normal affect  Laboratory: Recent Labs  Lab 05/22/19 0814 05/25/19 1253 05/27/19 0236  WBC 12.3* 10.6* 10.0  HGB 9.0* 8.1* 8.8*  HCT 27.8* 24.9* 27.9*  PLT 423* 457* 488*   Recent Labs  Lab 05/25/19 0257 05/26/19 0307 05/27/19 0236  NA 136 135 133*  K 4.3 4.0 4.3  CL 91* 94* 93*  CO2 24 24 22   BUN 97* 62* 104*  CREATININE 10.54* 7.41* 9.65*  CALCIUM 10.5* 9.7 10.4*  GLUCOSE 108* 135* 109*    Imaging/Diagnostic Tests: Now new imaging  Gerlene Fee, DO 05/27/2019, 7:44 AM PGY-1, [place] Intern pager: [phone], text pages welcome

## 2019-05-27 NOTE — Progress Notes (Addendum)
Renal Navigator notes plan for discharge today to SNF. Patient has not been able to sit in chair for HD. On Tuesday, patient was flat on a bed rolled to his side due to wound on his bottom. Renal Navigator spoke with Nephrologist and Renal PA about discharge and Nephrologist stated patient will try HD in recliner again today. Discharge pending successful treatment in chair today. CM notified. Renal Navigator spoke with PT/L. Rich Reining about patient's ability to sit on his wound. She suggested a geo-mat be ordered for him to help relieve some of the pressure. Renal Navigator called patient's bedside RN, Suezanne Jacquet who states he will order one. Navigator appreciates team's assistance in caring for patient. As long as patient's pending COVID test results as negative, patient can return to home unit [facility] will follow result.  Alphonzo Cruise, [place] [facility] Renal Navigator [phone]

## 2019-05-27 NOTE — Care Management Important Message (Signed)
Important Message  Patient Details  Name: Troy Wells MRN: [id number] Date of Birth: [birth year]   Medicare Important Message Given:  Yes     Orbie Pyo 05/27/2019, 3:06 PM

## 2019-05-27 NOTE — Progress Notes (Signed)
RT offered pt CPAP dream station for the night. Pt declined for the night. RT expressed to pt if he changes his mind to call. RT will continue to monitor.

## 2019-05-27 NOTE — Progress Notes (Signed)
  [facility] Progress Note   Subjective: Seen in room. No c/o. Patient unable to tell me what he's doing here, how he got here, what his medical problems are or what the device in his arm is (AVF).  Able to converse well though otherwise.   Objective Vitals:   05/26/19 0735 05/26/19 1553 05/26/19 2300 05/27/19 0831  BP: 130/77 119/78 120/65 128/64  Pulse: 90 85 78 72  Resp:   19 17  Temp: 98.9 F (37.2 C) 98.2 F (36.8 C) 98.4 F (36.9 C) 98.4 F (36.9 C)  TempSrc: Axillary Oral Oral Oral  SpO2: 99% 97% 96% 100%  Weight:      Height:       Physical Exam General:Chronically ill appearing male in NAD Heart:S1,S2 RRR Lungs:CTAB Abdomen:Active BS ND Extremities:No LE edema. Heel protectors in place Dialysis Access:L AVF + bruit   Summary: Pt is a31 y.o.yo malewith ESRD who was admitted on11/10/2020with resp failure due to COVID/pulm edema and cardiac arrest   Dialysis: [facility] 4.5h F250 124.5kg 400/800 2/2.25 Hep 12000 AVF - mircera 30 q4 last 10/13 - hect 4  Assess/ Plan: 1.HxCOVIDHCAP: S/p abx - off airborne precautions. 2. Vol overload: down 22 kg, resolved, on room air.  3. ESRD: TTS HD. RequiredCRRT 11/18- 11/24. Now on reg HD TTS. HD today.  4. Debility - pt has significant bilat LE/ general weakness which may prevent him from doing OP HD at this point (must sit in chair for 4-6 hrs 3X/ week). Will need to demonstrate HD in a chair here before can be dc'd to SNF or home.  Will d/w primary team.  5. Memory loss - significant memory loss, unable to give any details of why he is here, medical problems.   6.Anemia(ESRD + ABLA): S/p1U PRBC11/16 and 11/19. Continue max Aranesp 239mcg weekly. Hgb 8.8. 7.Secondary HPTH/hypercalcemia: Most likely d/t immobilization. VDRAheld- given calcitonin 11/25-11/26. Follow for now. If rises again, will consider pamidronate. 2Ca bath. Phos sky high(although >30 was error), Illene Regulus pwdr  dose to 2.4g TIW.PTH 459 start sensipar 30 mg PO daily and uptitrate. 8. RAQ:TMAU to be ABI, stable, communicates well but poor memory 9. ?Seizure:prev took [place] - held this admit. Seen by neuro. EEG W/O seizure activity. Neuro signed off.  10. Sacral Decub-Daily drsg changes. Per primary 11. Dispo - see above   Kelly Splinter, MD 05/24/2019, 4:33 PM  Meds  . acetaminophen  650 mg Oral Q4H  . allopurinol  100 mg Oral Daily  . aspirin  81 mg Oral Daily  . atorvastatin  80 mg Oral Daily  . carvedilol  12.5 mg Oral BID WC  . Chlorhexidine Gluconate Cloth  6 each Topical Q0600  . cinacalcet  30 mg Oral Q supper  . collagenase   Topical Daily  . darbepoetin (ARANESP) injection - DIALYSIS  200 mcg Intravenous Q Tue-HD  . feeding supplement (NEPRO CARB STEADY)  237 mL Oral TID BM  . feeding supplement (PRO-STAT SUGAR FREE 64)  30 mL Oral TID  . heparin      . heparin injection (subcutaneous)  5,000 Units Subcutaneous Q8H  . multivitamin  1 tablet Oral QHS  . nutrition supplement (JUVEN)  1 packet Oral BID BM  . sevelamer carbonate  2.4 g Oral TID WC

## 2019-05-28 DIAGNOSIS — G931 Anoxic brain damage, not elsewhere classified: Secondary | ICD-10-CM

## 2019-05-28 LAB — RENAL FUNCTION PANEL
Albumin: 2.5 g/dL — ABNORMAL LOW (ref 3.5–5.0)
Anion gap: 14 (ref 5–15)
BUN: 67 mg/dL — ABNORMAL HIGH (ref 6–20)
CO2: 26 mmol/L (ref 22–32)
Calcium: 10.3 mg/dL (ref 8.9–10.3)
Chloride: 94 mmol/L — ABNORMAL LOW (ref 98–111)
Creatinine, Ser: 7.44 mg/dL — ABNORMAL HIGH (ref 0.61–1.24)
GFR calc Af Amer: 9 mL/min — ABNORMAL LOW (ref >60)
GFR calc non Af Amer: 8 mL/min — ABNORMAL LOW (ref >60)
Glucose, Bld: 131 mg/dL — ABNORMAL HIGH (ref 70–99)
Phosphorus: 4.8 mg/dL — ABNORMAL HIGH (ref 2.5–4.6)
Potassium: 4 mmol/L (ref 3.5–5.1)
Sodium: 134 mmol/L — ABNORMAL LOW (ref 135–145)

## 2019-05-28 MED ORDER — CHLORHEXIDINE GLUCONATE CLOTH 2 % EX PADS
6.0000 | MEDICATED_PAD | Freq: Every day | CUTANEOUS | Status: DC
  Administered 2019-05-28 – 2019-06-01 (×4): 6 via TOPICAL

## 2019-05-28 NOTE — Progress Notes (Signed)
  [facility] Progress Note   Subjective: Seen in room. No c/o. Watching [employer].   Objective Vitals:   05/27/19 1739 05/27/19 1955 05/28/19 0157 05/28/19 0751  BP: 105/85 136/75 110/76 128/88  Pulse: 95 98 95 88  Resp: 16 (!) 22 18   Temp: 99.5 F (37.5 C) 98.2 F (36.8 C) 98.2 F (36.8 C) 98.1 F (36.7 C)  TempSrc: Oral Oral Oral Oral  SpO2: 98% 97% 98% 99%  Weight:   97.2 kg   Height:       Physical Exam General:Chronically ill appearing male in NAD Heart:S1,S2 RRR Lungs:CTAB Abdomen:Active BS ND Extremities:No LE edema. Heel protectors in place Dialysis Access:L AVF + bruit   Summary: Pt is a78 y.o.yo malewith ESRD who was admitted on11/10/2020with resp failure due to COVID/pulm edema and cardiac arrest   Dialysis: [facility] 4.5h F250 124.5kg 400/800 2/2.0Ca (low Ca here for ^Ca) Hep 12000 AVF - mircera 30 q4 last 10/13 - hect 4  Assess/ Plan: 1.HxCOVIDHCAP: S/p abx - off airborne precautions. 2. Vol overload: down 22 kg, resolved, on room air.  3. ESRD: TTS HD. RequiredCRRT 11/18- 11/24. Now on reg HD TTS. Next HD tomorrow.  4. Debility - pt has significant bilat LE/ general weakness. Will need to demonstrate HD in a chair here before can be dc'd to SNF or home. 5. AMS: Felt to be ABI. Communicates well but memory issues wax/ wane 6.Anemia(ESRD + ABLA): S/p1U PRBC11/16 and 11/19. Continue max Aranesp 215mcg weekly. Hgb 8.8. 7.Secondary HPTH/hypercalcemia: Most likely d/t immobilization. VDRAheld- given calcitonin 11/25-11/26. If rises again, will consider pamidronate. Low Ca (2Ca) bath. Phos controlled. Renvela pwdr 2.4g TIW.PTH 459 start sensipar 30 mg PO daily and uptitrate. 8. ?Seizure:prev took [place] - held this admit. Seen by neuro. EEG W/O seizure activity. Neuro signed off.  9. Sacral Decub-Daily drsg changes. Per primary 10. Dispo - see above   Kelly Splinter, MD 05/24/2019, 4:33 PM  Meds  .  acetaminophen  650 mg Oral Q4H  . allopurinol  100 mg Oral Daily  . aspirin  81 mg Oral Daily  . atorvastatin  80 mg Oral Daily  . carvedilol  12.5 mg Oral BID WC  . Chlorhexidine Gluconate Cloth  6 each Topical Q0600  . cinacalcet  30 mg Oral Q supper  . collagenase   Topical Daily  . darbepoetin (ARANESP) injection - DIALYSIS  200 mcg Intravenous Q Tue-HD  . feeding supplement (NEPRO CARB STEADY)  237 mL Oral TID BM  . feeding supplement (PRO-STAT SUGAR FREE 64)  30 mL Oral TID  . heparin injection (subcutaneous)  5,000 Units Subcutaneous Q8H  . multivitamin  1 tablet Oral QHS  . nutrition supplement (JUVEN)  1 packet Oral BID BM  . sevelamer carbonate  2.4 g Oral TID WC

## 2019-05-28 NOTE — Progress Notes (Signed)
Physical Therapy Wound Treatment Patient Details  Name: Slevin Gunby MRN: [id number] Date of Birth: August 29, 1965  Today's Date: 05/28/2019 Time: 1139-1202 Time Calculation (min): 23 min  Subjective  Subjective: pt asking to stop several times  Pain Score: pain with PLS or debridement6-8/10.  Asked for more breakthrough pain meds for next treatment  Wound Assessment  Pressure Injury 05/05/19 Sacrum Medial Unstageable - Full thickness tissue loss in which the base of the ulcer is covered by slough (yellow, tan, gray, green or brown) and/or eschar (tan, brown or black) in the wound bed. Fluid filled blister on righ sac (Active)  Wound Image   05/25/19 1242  Dressing Type ABD;Gauze (Comment);Moist to dry 05/28/19 1318  Dressing Changed;Clean;Dry;Intact 05/28/19 1318  Dressing Change Frequency Daily 05/28/19 1318  State of Healing Early/partial granulation 05/28/19 1318  Site / Wound Assessment Red;Yellow 05/28/19 1318  % Wound base Red or Granulating 50% 05/28/19 1318  % Wound base Yellow/Fibrinous Exudate 50% 05/28/19 1318  % Wound base Black/Eschar 0% 05/28/19 1318  % Wound base Other/Granulation Tissue (Comment) 0% 05/28/19 1318  Peri-wound Assessment Intact 05/28/19 1318  Wound Length (cm) 7.4 cm 05/25/19 1000  Wound Width (cm) 5.2 cm 05/25/19 1000  Wound Depth (cm) 3.2 cm 05/25/19 1000  Wound Surface Area (cm^2) 38.48 cm^2 05/25/19 1000  Wound Volume (cm^3) 123.14 cm^3 05/25/19 1000  Undermining (cm) 1.8 cm from 12:00-5:00 05/25/19 1242  Margins Unattached edges (unapproximated) 05/28/19 1318  Drainage Amount Moderate 05/28/19 1318  Drainage Description Purulent;Odor 05/28/19 1318  Treatment Cleansed;Debridement (Selective);Hydrotherapy (Pulse lavage);Packing (Saline gauze) 05/28/19 1318  Santyl applied to wound bed prior to applying dressing.    Hydrotherapy Pulsed lavage therapy - wound location: Sacrum Pulsed Lavage with Suction (psi): 8 psi(to 12 psi) Pulsed Lavage  with Suction - Normal Saline Used: 1000 mL Pulsed Lavage Tip: Tip with splash shield Selective Debridement Selective Debridement - Location: Sacrum Selective Debridement - Tools Used: Forceps;Scissors Selective Debridement - Tissue Removed: Yellow necrotic tissue   Wound Assessment and Plan  Wound Therapy - Assess/Plan/Recommendations Wound Therapy - Clinical Statement: wound bed is improving with some slough still at the 12* position.  Santyl and debridement should clean up the rest fairly quickly Wound Therapy - Functional Problem List: Decreased tolerance for OOB and positioning due to decreased skin integrity.  Factors Delaying/Impairing Wound Healing: Diabetes Mellitus;Incontinence;Immobility;Multiple medical problems Hydrotherapy Plan: Debridement;Dressing change;Patient/family education;Pulsatile lavage with suction Wound Therapy - Frequency: 6X / week Wound Therapy - Follow Up Recommendations: Skilled nursing facility Wound Plan: See above  Wound Therapy Goals- Improve the function of patient's integumentary system by progressing the wound(s) through the phases of wound healing (inflammation - proliferation - remodeling) by: Decrease Necrotic Tissue - Progress: Progressing toward goal Increase Granulation Tissue - Progress: Progressing toward goal Improve Drainage Characteristics: Min;Serous Improve Drainage Characteristics - Progress: Progressing toward goal Goals/treatment plan/discharge plan were made with and agreed upon by patient/family: Yes Time For Goal Achievement: 7 days Wound Therapy - Potential for Goals: Good  Goals will be updated until maximal potential achieved or discharge criteria met.  Discharge criteria: when goals achieved, discharge from hospital, MD decision/surgical intervention, no progress towards goals, refusal/missing three consecutive treatments without notification or medical reason.  GP     Tessie Fass Jaleia Hanke 05/28/2019, 1:23 PM  05/28/2019   Donnella Sham, PT Acute Rehabilitation Services [phone]  (pager) [phone]  (office)

## 2019-05-28 NOTE — Progress Notes (Signed)
Family Medicine Teaching Service Daily Progress Note Intern Pager: [phone]  Patient name: Troy Wells Medical record number: [id number] Date of birth: 09/16/1965 Age: 53 y.o. Gender: male  Primary Care Provider: [name], MD Consultants: Neurology, critical care, nephrology Code Status: Full  Pt Overview and Major Events to Date:  04/27/2019 admitted to ICU 05/02/2019 MRI brain showing anoxic brain injury 05/10/2019: Extubated 05/12/2019: Started receiving care under FPTS 05/20/2019: EEG w/o seizure, Neuro signed off 05/21/2019: Stable for discharge to SNF  Assessment and Plan: Troy Wells is a 53 y.o. male transferred from ICU after cardiac arrest with recent history of COVID-19 infection.  PMH significant for ESRD MWF on HD, OSA, HTN, HLD, HFpEF, pHTN, type 2 diabetes, and diabetic neuropathy.  Encephalopathy  Possible anoxic brain injury  PEA cardiac arrest No events overnight. Appears to have plateau with improvement for now. Is agreeable with plan to go to SNF with rehab.  Talked about goals today to be discharged to rehab.  Patient needs to eat more and continue to increase his strength with PT and be able to tolerate sitting up in a chair for hemodialysis. -Appreciate PT/OT working with patient -Monitor improvement   COVID (resolved)  Enterobacter HAP  Leukocytosis Patient now greater than 21 days out from Covid diagnosis. Has been cleared by infectious disease. WBC 10 12/10.  T-max 100.18F with afebrile temp this morning. LLL infiltrate on CXR 12/2.  Repeat Covid test on 05/24/2019 is negative.  -Monitor fever curve, if fevers again start antibiotics   ESRD on HD  Hypercalcemia  Secondary hyperparathyroidism Dialysis per nephro TTS schedule.  K 4.0, BUN 67, Cr 7.44 Outpatient schedule is TTS.   -Nephrology following, appreciate recs:  -Daily renal function panel -Aranesp every Tuesday with HD  HFmEF  CAD ECHO on 04/27/19 40 to 45%.  LV with mild to  moderate decreased fxn, mildly increased LVH, inferiolateral akinesis and anterolateral severe hypokinesis. Grade 2 diastolic dysfunction.  -Continue aspirin 81 -Continue Lipitor 80 mg daily -Coreg 25 mg twice daily -Torsemide 100 mg daily on dialysis days and Sunday.  Hypertension  Tachycardia Normotensive, BP 110/76. Not tachycardic this morning. HR 95. -Continue Coreg 25 mg twice daily  -Continue amlodipine 10 mg daily -Continue to hold home minoxidil 20 mg twice daily  OSA with use of CPAP  Currently on RA 98%. OSA at baseline and uses CPAP intermittently. Now 21+ days out from Covid diagnosis, can use CPAP if needed. -Continue to encourage patient to use CPAP at bedtime  Diabetes Home medications include glipizide 5 mg BID. Last A1c 5.8 on 04/14/2019. Inpatient glucose has been stable.  131 on BMP. -Continue holding home glipizide  HLD Home medications include atorvastatin 40mg  -Atorvastatin 80mg  daily  Diabetic Neuropathy  Patient on home gabapentin 300 mg TID. Patient reports only taking BID.  -Hold gabapentin 300mg  TID    Gout  Home medications include allopurinol 100mg  daily and colchicine 0.6mg  BID as needed when symptomatic.  -Continue home allopurinol  LUE/sacral ulcer 2/2 to being bed bound in ICU. Wounds covered with clean guaze but sacral wound emitting odor. Discussed the need to sit up in chair for discharge.  -Up to chair and sit as tolerated -Continue wound care daily; begin hydrotherapy -Continue turning patient  q2h more on the right side due to left shoulder wond  Social/Disposition -Bed was available at [facility]  FEN/GI:  Dysphagia 3 diet PPx: Heparin 5000 units subq 3 times daily   Disposition: Needs to be able to  tolerate sitting up for outpatient dialysis  Subjective:  Says he is doing good today.  Understands his goals for discharge and is willing to try.  Objective: Temp:  [98.2 F (36.8 C)-99.5 F (37.5 C)] 98.2 F (36.8 C)  (12/11 0157) Pulse Rate:  [70-98] 95 (12/11 0157) Resp:  [16-22] 18 (12/11 0157) BP: (85-136)/(52-85) 110/76 (12/11 0157) SpO2:  [97 %-100 %] 98 % (12/11 0157) Weight:  [97.2 kg] 97.2 kg (12/11 0157)  Physical Exam:  General: Appears well, no acute distress. Age appropriate.  Resting in bed on the right side. Cardiac: RRR, normal heart sounds, no murmurs Respiratory: CTAB, normal effort Extremities: No edema or cyanosis. Skin: Warm and dry, shoulder wound with clean gauze. Neuro: alert and oriented, no focal deficits Psych: normal affect   Laboratory: Recent Labs  Lab 05/22/19 0814 05/25/19 1253 05/27/19 0236  WBC 12.3* 10.6* 10.0  HGB 9.0* 8.1* 8.8*  HCT 27.8* 24.9* 27.9*  PLT 423* 457* 488*   Recent Labs  Lab 05/26/19 0307 05/27/19 0236 05/28/19 0300  NA 135 133* 134*  K 4.0 4.3 4.0  CL 94* 93* 94*  CO2 24 22 26   BUN 62* 104* 67*  CREATININE 7.41* 9.65* 7.44*  CALCIUM 9.7 10.4* 10.3  GLUCOSE 135* 109* 131*    Imaging/Diagnostic Tests: Now new imaging  Gerlene Fee, DO 05/28/2019, 6:55 AM PGY-1, [place] Intern pager: [phone], text pages welcome

## 2019-05-29 DIAGNOSIS — R531 Weakness: Secondary | ICD-10-CM

## 2019-05-29 LAB — RENAL FUNCTION PANEL
Albumin: 2.7 g/dL — ABNORMAL LOW (ref 3.5–5.0)
Anion gap: 18 — ABNORMAL HIGH (ref 5–15)
BUN: 98 mg/dL — ABNORMAL HIGH (ref 6–20)
CO2: 22 mmol/L (ref 22–32)
Calcium: 10.2 mg/dL (ref 8.9–10.3)
Chloride: 91 mmol/L — ABNORMAL LOW (ref 98–111)
Creatinine, Ser: 10.09 mg/dL — ABNORMAL HIGH (ref 0.61–1.24)
GFR calc Af Amer: 6 mL/min — ABNORMAL LOW (ref >60)
GFR calc non Af Amer: 5 mL/min — ABNORMAL LOW (ref >60)
Glucose, Bld: 144 mg/dL — ABNORMAL HIGH (ref 70–99)
Phosphorus: 6.3 mg/dL — ABNORMAL HIGH (ref 2.5–4.6)
Potassium: 4.4 mmol/L (ref 3.5–5.1)
Sodium: 131 mmol/L — ABNORMAL LOW (ref 135–145)

## 2019-05-29 MED ORDER — HEPARIN SODIUM (PORCINE) 1000 UNIT/ML IJ SOLN
INTRAMUSCULAR | Status: AC
  Administered 2019-05-29: 10000 [IU] via ARTERIOVENOUS_FISTULA
  Filled 2019-05-29: qty 10

## 2019-05-29 NOTE — Progress Notes (Signed)
Physical Therapy Wound Treatment Patient Details  Name: Troy Wells MRN: [id number] Date of Birth: 05-29-1966  Today's Date: 05/29/2019 Time: 4356-8616 Time Calculation (min): 26 min  Subjective  Subjective: pt asking to stop several times Patient and Family Stated Goals: None stated  Pain Score: pt with convulsion like tremors when the pain from PLS or debridement hits him.  Pain 8/10.  Therapy is stopping frequently when he can't tolerate the treatment   Wound Assessment  Pressure Injury 05/05/19 Sacrum Medial Unstageable - Full thickness tissue loss in which the base of the ulcer is covered by slough (yellow, tan, gray, green or brown) and/or eschar (tan, brown or black) in the wound bed. Fluid filled blister on righ sac (Active)  Wound Image   05/29/19 1233  Dressing Type ABD;Gauze (Comment);Moist to dry 05/29/19 1233  Dressing Changed;Clean;Dry;Intact 05/29/19 1233  Dressing Change Frequency Daily 05/29/19 1233  State of Healing Early/partial granulation 05/29/19 1233  Site / Wound Assessment Red;Yellow 05/29/19 1233  % Wound base Red or Granulating 70% 05/29/19 1233  % Wound base Yellow/Fibrinous Exudate 30% 05/29/19 1233  % Wound base Black/Eschar 0% 05/29/19 1233  % Wound base Other/Granulation Tissue (Comment) 0% 05/29/19 1233  Peri-wound Assessment Intact 05/29/19 1233  Wound Length (cm) 7.4 cm 05/25/19 1000  Wound Width (cm) 5.2 cm 05/25/19 1000  Wound Depth (cm) 3.2 cm 05/25/19 1000  Wound Surface Area (cm^2) 38.48 cm^2 05/25/19 1000  Wound Volume (cm^3) 123.14 cm^3 05/25/19 1000  Undermining (cm) 1.8 cm from 12:00-5:00 05/25/19 1242  Margins Unattached edges (unapproximated) 05/29/19 1233  Drainage Amount Moderate 05/29/19 1233  Drainage Description Serosanguineous 05/29/19 1233  Treatment Cleansed;Debridement (Selective);Hydrotherapy (Pulse lavage);Packing (Saline gauze) 05/29/19 1233     Pressure Injury (Active)  Dressing Type Foam - Lift dressing to assess  site every shift 05/29/19 1233  Wound Length (cm) 1 cm 05/12/19 1000  Wound Width (cm) 0.3 cm 05/12/19 1000  Wound Depth (cm) 0 cm 05/19/19 1000  Wound Surface Area (cm^2) 0.3 cm^2 05/12/19 1000  Wound Volume (cm^3) 0.03 cm^3 05/12/19 1000   Santyl applied to wound bed prior to applying dressing.    Hydrotherapy Pulsed lavage therapy - wound location: Sacrum Pulsed Lavage with Suction (psi): 8 psi(to 12 psi) Pulsed Lavage with Suction - Normal Saline Used: 1000 mL Pulsed Lavage Tip: Tip with splash shield Selective Debridement Selective Debridement - Location: Sacrum Selective Debridement - Tools Used: Forceps;Scissors Selective Debridement - Tissue Removed: Yellow necrotic tissue   Wound Assessment and Plan  Wound Therapy - Assess/Plan/Recommendations Wound Therapy - Clinical Statement: wound bed is improving with some slough still at the 12* position.  Santyl is slowly doing its job.  I think due to the cleaned up nature of the wound and pt's intolerance for hydrotherapy, I think it is time to consider VAC dressing placement. Wound Therapy - Functional Problem List: Decreased tolerance for OOB and positioning due to decreased skin integrity.  Factors Delaying/Impairing Wound Healing: Diabetes Mellitus;Incontinence;Immobility;Multiple medical problems Hydrotherapy Plan: Debridement;Dressing change;Patient/family education;Pulsatile lavage with suction Wound Therapy - Frequency: 6X / week Wound Therapy - Follow Up Recommendations: Skilled nursing facility Wound Plan: See above  Wound Therapy Goals- Improve the function of patient's integumentary system by progressing the wound(s) through the phases of wound healing (inflammation - proliferation - remodeling) by: Decrease Necrotic Tissue - Progress: Progressing toward goal Increase Granulation Tissue - Progress: Progressing toward goal Improve Drainage Characteristics: Min;Serous Improve Drainage Characteristics - Progress:  Progressing toward goal Goals/treatment plan/discharge plan were made  with and agreed upon by patient/family: Yes Time For Goal Achievement: 7 days Wound Therapy - Potential for Goals: Good  Goals will be updated until maximal potential achieved or discharge criteria met.  Discharge criteria: when goals achieved, discharge from hospital, MD decision/surgical intervention, no progress towards goals, refusal/missing three consecutive treatments without notification or medical reason.  GP     Tessie Fass Shayli Altemose 05/29/2019, 12:38 PM

## 2019-05-29 NOTE — Progress Notes (Signed)
Family Medicine Teaching Service Daily Progress Note Intern Pager: [phone]  Patient name: Troy Wells Medical record number: [id number] Date of birth: 1965-11-03 Age: 53 y.o. Gender: male  Primary Care Provider: [name], MD Consultants: Neurology, critical care, nephrology Code Status: Full  Pt Overview and Major Events to Date:  04/27/2019 admitted to ICU 05/02/2019 MRI brain showing anoxic brain injury 05/10/2019: Extubated 05/12/2019: Started receiving care under FPTS 05/20/2019: EEG w/o seizure, Neuro signed off 05/21/2019: Stable for discharge to SNF  Assessment and Plan: Troy Wells is a 53 y.o. male transferred from ICU after cardiac arrest with recent history of COVID-19 infection.  PMH significant for ESRD MWF on HD, OSA, HTN, HLD, HFpEF, pHTN, type 2 diabetes, and diabetic neuropathy.  Encephalopathy  Possible anoxic brain injury  PEA cardiac arrest No events overnight. Appears to have plateau with improvement for now. Patient needs to eat more and continue to increase his strength with PT and be able to tolerate sitting up in a chair for hemodialysis.  Plan is to have another discussion with daughter today about goals of care. -Appreciate PT/OT working with patient -Monitor improvement   COVID  Enterobacter HAP  Leukocytosis (Resolved) Patient now greater than 21 days out from Covid diagnosis. Has been cleared by infectious disease. Repeat Covid test on 05/24/2019 is negative.    ESRD on HD  Hypercalcemia (Resolved)  Secondary hyperparathyroidism Dialysis per nephro TTS schedule. -Nephrology following, appreciate recs:  -Daily renal function panel -Aranesp every Tuesday with HD -senispar 30mg  daily, renvela 2.4g TID  HFmEF  CAD ECHO on 04/27/19 40 to 45%.  LV with mild to moderate decreased fxn, mildly increased LVH, inferiolateral akinesis and anterolateral severe hypokinesis. Grade 2 diastolic dysfunction.  -Continue aspirin 81 -Continue  Lipitor 80 mg daily -Coreg 25 mg twice daily -Torsemide 100 mg daily on dialysis days and Sunday.  Hypertension  Tachycardia Normotensive, BP 115/62. Not tachycardic. HR 76. -Continue Coreg 25 mg twice daily  -Continue amlodipine 10 mg daily -Continue to hold home minoxidil 20 mg twice daily  OSA with use of CPAP  Currently on RA 98%. OSA at baseline and uses CPAP intermittently. Now 21+ days out from Covid diagnosis, can use CPAP if needed. -Continue to encourage patient to use CPAP at bedtime  Diabetes Home medications include glipizide 5 mg BID. Last A1c 5.8 on 04/14/2019. Inpatient glucose has been stable.  131 on RFP. -Continue holding home glipizide  HLD Home medications include atorvastatin 40mg  -Atorvastatin 80mg  daily  Diabetic Neuropathy  Patient on home gabapentin 300 mg TID. Patient reports only taking BID.  -Hold gabapentin 300mg  TID    Gout  Home medications include allopurinol 100mg  daily and colchicine 0.6mg  BID as needed when symptomatic.  -Continue home allopurinol  LUE/sacral ulcer 2/2 to being bed bound in ICU. Wounds covered with clean guaze but sacral wound emitting odor. Discussed the need to sit up in chair for discharge.  -Up to chair and sit as tolerated -Continue wound care daily; begin hydrotherapy 7.5mg  oxy IR for pain -Continue turning patient  q2h more on the right side due to left shoulder wond  Social/Disposition -Bed was available at [facility] 12/9, may not be available now, patient still needs improvement before discharge  FEN/GI:  Dysphagia 3 diet PPx: Heparin 5000 units subq 3 times daily   Disposition: Needs to be able to tolerate sitting up for outpatient dialysis  Subjective:  He says that he is doing okay has some pain in his lower back  and would like to get up and in the chair. Says someone took his clothes. He is not sure where they are.   Objective: Temp:  [97.9 F (36.6 C)-98.2 F (36.8 C)] 97.9 F (36.6 C)  (12/12 0037) Pulse Rate:  [76] 76 (12/12 0037) Resp:  [18] 18 (12/12 0037) BP: (115-120)/(62-77) 115/62 (12/12 0037) SpO2:  [97 %] 97 % (12/11 1700) Weight:  [90.9 kg] 90.9 kg (12/12 0500)  Physical Exam:  General: Appears well, no acute distress. Age appropriate.  Lying in bed on left side. Cardiac: RRR, normal heart sounds, no murmurs Respiratory: CTAB, normal effort Extremities: No edema or cyanosis. Skin: Warm and dry, no rashes noted Neuro: alert and oriented, no focal deficits Psych: normal affect  Laboratory: Recent Labs  Lab 05/22/19 0814 05/25/19 1253 05/27/19 0236  WBC 12.3* 10.6* 10.0  HGB 9.0* 8.1* 8.8*  HCT 27.8* 24.9* 27.9*  PLT 423* 457* 488*   Recent Labs  Lab 05/26/19 0307 05/27/19 0236 05/28/19 0300  NA 135 133* 134*  K 4.0 4.3 4.0  CL 94* 93* 94*  CO2 24 22 26   BUN 62* 104* 67*  CREATININE 7.41* 9.65* 7.44*  CALCIUM 9.7 10.4* 10.3  GLUCOSE 135* 109* 131*    Imaging/Diagnostic Tests: Now new imaging  Gerlene Fee, DO 05/29/2019, 7:53 AM PGY-1, [place] Intern pager: [phone], text pages welcome

## 2019-05-29 NOTE — Progress Notes (Signed)
RT offered to place pt on CPAP dream station for the night and pt declined stating he did not want to wear it. RT expressed to pt that if he changed his mind to have RN to call for placement. RT will continue to monitor.

## 2019-05-29 NOTE — Progress Notes (Signed)
Hydrotherapy update note  Hydrotherapy completed today.  Pt DOES NOT tolerate the procedure well and the wound looks good enough that I think he should proceed to a VAC dressing.  If you and wound team agree, please discontinue hydrotherapy in favor of VAC placement.  Today's note to follow.   Yvone Neu  05/29/2019  Donnella Sham, PT Acute Rehabilitation Services [phone]  (pager) [phone]  (office)

## 2019-05-29 NOTE — Progress Notes (Signed)
   [facility] Progress Note   Subjective: no new c/o  Objective Vitals:   05/28/19 0751 05/28/19 1700 05/29/19 0037 05/29/19 0500  BP: 128/88 120/77 115/62   Pulse: 88  76   Resp:   18   Temp: 98.1 F (36.7 C) 98.2 F (36.8 C) 97.9 F (36.6 C)   TempSrc: Oral Oral Oral   SpO2: 99% 97%    Weight:    90.9 kg  Height:       Physical Exam General:Chronically ill appearing male in NAD Heart:S1,S2 RRR Lungs:CTAB Abdomen:Active BS ND Extremities:No LE edema. Heel protectors in place Dialysis Access:L AVF + bruit   Summary: Pt is a38 y.o.yo malewith ESRD who was admitted on11/10/2020with resp failure due to COVID/pulm edema and cardiac arrest   Dialysis: [facility] 4.5h F250 124.5kg 400/800 2/2.0Ca (low Ca here for ^Ca) Hep 12000 AVF - mircera 30 q4 last 10/13 - hect 4  Assess/ Plan: 1.HxCOVIDHCAP: S/p abx - off airborne precautions. 2. Vol overload: down 22 kg, resolved, on room air.  3. ESRD: TTS HD. [place] 11/18- 11/24. Now on reg HD TTS. HD today.  4. Debility - pt has significant bilat LE/ general weakness. Will need to demonstrate HD in a chair here before can be dc'd to SNF or home. I have my doubts about how much he will be able to rehabilitate given his memory and initiative issues. If not able to get stronger he will not do well on long-term dialysis.  5. AMS: Felt to be ABI. Communicates well but cognitive/ memory issue is significant 6.Anemia(ESRD + ABLA): S/p1U PRBC11/16 and 11/19. Continue max Aranesp 268mcg weekly. Hgb 8.8. 7.Secondary HPTH/hypercalcemia: Most likely d/t immobilization. VDRAheld- given calcitonin 11/25-11/26. If rises again, will consider pamidronate. Low Ca (2Ca) bath. Phos controlled. Renvela pwdr 2.4g TIW.PTH 459 start sensipar 30 mg PO daily and uptitrate. 8. ?Seizure:prev took [place] - held this admit. Seen by neuro. EEG W/O seizure activity. Neuro signed off.  9. Sacral Decub-Daily drsg  changes. Per primary 10. Dispo - see above   Kelly Splinter, MD 05/24/2019, 4:33 PM  Meds  . acetaminophen  650 mg Oral Q4H  . allopurinol  100 mg Oral Daily  . aspirin  81 mg Oral Daily  . atorvastatin  80 mg Oral Daily  . carvedilol  12.5 mg Oral BID WC  . Chlorhexidine Gluconate Cloth  6 each Topical Q0600  . Chlorhexidine Gluconate Cloth  6 each Topical Q0600  . cinacalcet  30 mg Oral Q supper  . collagenase   Topical Daily  . darbepoetin (ARANESP) injection - DIALYSIS  200 mcg Intravenous Q Tue-HD  . feeding supplement (NEPRO CARB STEADY)  237 mL Oral TID BM  . feeding supplement (PRO-STAT SUGAR FREE 64)  30 mL Oral TID  . heparin injection (subcutaneous)  5,000 Units Subcutaneous Q8H  . multivitamin  1 tablet Oral QHS  . nutrition supplement (JUVEN)  1 packet Oral BID BM  . sevelamer carbonate  2.4 g Oral TID WC

## 2019-05-30 DIAGNOSIS — L89153 Pressure ulcer of sacral region, stage 3: Secondary | ICD-10-CM

## 2019-05-30 LAB — RENAL FUNCTION PANEL
Albumin: 2.6 g/dL — ABNORMAL LOW (ref 3.5–5.0)
Anion gap: 15 (ref 5–15)
BUN: 32 mg/dL — ABNORMAL HIGH (ref 6–20)
CO2: 26 mmol/L (ref 22–32)
Calcium: 9.5 mg/dL (ref 8.9–10.3)
Chloride: 94 mmol/L — ABNORMAL LOW (ref 98–111)
Creatinine, Ser: 5.82 mg/dL — ABNORMAL HIGH (ref 0.61–1.24)
GFR calc Af Amer: 12 mL/min — ABNORMAL LOW (ref >60)
GFR calc non Af Amer: 10 mL/min — ABNORMAL LOW (ref >60)
Glucose, Bld: 117 mg/dL — ABNORMAL HIGH (ref 70–99)
Phosphorus: 4.8 mg/dL — ABNORMAL HIGH (ref 2.5–4.6)
Potassium: 3.8 mmol/L (ref 3.5–5.1)
Sodium: 135 mmol/L (ref 135–145)

## 2019-05-30 NOTE — Progress Notes (Addendum)
Family Medicine Teaching Service Daily Progress Note Intern Pager: [phone]  Patient name: Troy Wells Medical record number: [id number] Date of birth: 04/11/1966 Age: 53 y.o. Gender: male  Primary Care Provider: [name], MD Consultants: Neurology, critical care, nephrology Code Status: Full   Pt Overview and Major Events to Date:  04/27/2019 admitted to ICU 05/02/2019 MRI brain showing anoxic brain injury 05/10/2019: Extubated 05/12/2019: Started receiving care under FPTS 05/20/2019: EEG w/o seizure, Neuro signed off 05/21/2019: Stable for discharge to SNF  Assessment and Plan: Troy Wells is a 53 y.o. male transferred from ICU after cardiac arrest with recent history of COVID-19 infection.  PMH significant for ESRD MWF on HD, OSA, HTN, HLD, HFpEF, pHTN, type 2 diabetes, and diabetic neuropathy.  Encephalopathy  Possible anoxic brain injury PEA cardiac arrest No acute events overnight.  Seems to be stable, no significant improvements.  Attending Dr. Andria Frames had goals of care discussion with daughter yesterday.  Per attestation note, daughter is aware of poor prognosis and continues to want usual aggressive care as opposed to switching to comfort care.  We will continue to monitor patient as he is unable to go to SNF at this time. -PT/OT consulted, appreciate recommendations -Monitor for improvement  LUE/sacral ulcer 2/2 to being bed bound in ICU.  To discharge patient will need to demonstrate he is able to sit in chair in order to tolerate hemodialysis as an outpatient.  Currently receiving hydrotherapy by physical therapy.  Per most recent physical therapy note, patient does not tolerate the procedure well and recommended VAC dressing.  Reports continued pain in sacrum especially with hydrotherapy.  Will consult gen sx for possible wound VAC, they plan to see patient. Not seem to be using his as needed acetaminophen.  I recommended he ask the RN for pain medication if  his sacrum is bothering him. -Up to chair and sit as tolerated -Continue wound care daily  -tylenol PRN -oxy PRN for hydrotherapy  -Continue turning patient  q2h more on the right side due to left shoulder wond -gen surgery consulted, awaiting recommendations  COVIDEnterobacterHAP  Leukocytosis (Resolved) Patient now greater than 21 days out from Covid diagnosis. Has been cleared by infectious disease. Repeat Covid test on 05/24/2019 is negative.  -may need repeat COVID prior to dc to SNF   ESRD on HDHypercalcemia (Resolved) Secondary hyperparathyroidism Dialysis per nephro TTS schedule. Last HD 12/12 per nephro note  -Nephrology following, appreciate recs -Daily renal function panel -Aranesp every Tuesday with HD -senispar 30mg  daily, renvela 2.4g TID  HFmEF CAD [facility] 04/27/19 40 to 45%. LV with mild to moderate decreased fxn,mildly increased LVH,inferiolateral akinesis and anterolateral severe hypokinesis. Grade 2 diastolic dysfunction.  -Continue aspirin 81 -Continue Lipitor 80 mg daily -Coreg 25 mg twice daily  Hypertension  Tachycardia Hypotensive, BP 97/56. HR 90. -Continue Coreg 25 mg twice daily   OSA with use of CPAP Currently on RA 98%. OSA at baseline and uses CPAP intermittently. Now 21+ days out from Covid diagnosis, can use CPAP if needed. Refused CPAP ON.  -Continue to encourage patient to use CPAP at bedtime  Diabetes Home medications include glipizide 5 mg BID.Last A1c 5.8 on 04/14/2019. Inpatient glucose has been stable.  117 on RFP. -holding home glipizide -would consider discontinuing glipizide on dc to avoid hypoglycemia  HLD Home medications include atorvastatin 40mg  -Atorvastatin 80mg  daily  Diabetic Neuropathy  Patient on home gabapentin 300 mg TID. Patient reports only taking BID.  -continue to hold homegabapentin  Gout  Home medications include allopurinol 100mg  daily and colchicine 0.6mg  BID as needed when  symptomatic. -Continue home allopurinol  Social/Disposition Bed was available at [facility] 12/9, may not be available now, patient still needs improvement before discharge -Discharge to SNF when stable  FEN/GI: Dysphagia 3 diet HRC:BULAGTX 5000 units subq 3 times daily  Disposition: pending ability to tolerate sitting at HD  Subjective:  Today reports he is feeling well.  Is oriented to person and place, did not know year but did know Christmas was coming up.  States he is doing overall well but does have pain in his sacrum especially with hydrotherapy.  Does not appear to be using as needed Tylenol so I advised he asked RN for this.  If cocci as needed is not enough can consider increasing dose  Objective: Temp:  [97.9 F (36.6 C)-98.4 F (36.9 C)] 98.4 F (36.9 C) (12/13 0820) Pulse Rate:  [79-100] 79 (12/13 0820) Resp:  [14-18] 18 (12/13 0820) BP: (90-133)/(53-94) 129/82 (12/13 0820) SpO2:  [94 %-99 %] 99 % (12/13 0820) Physical Exam: General: Awake and alert, no apparent distress, oriented to person and place, did not know year but knew Christmas was coming Cardiovascular: Regular rate and rhythm, no murmurs rubs or gallops Respiratory: There to auscultation bilaterally, no increased work of breathing Abdomen: Soft, nontender, nondistended Extremities: No edema Back: Sacrum was just recently dressed so did not remove dressing.  Picture from physical therapy 12/12 listed below     Laboratory: Recent Labs  Lab 05/25/19 1253 05/27/19 0236  WBC 10.6* 10.0  HGB 8.1* 8.8*  HCT 24.9* 27.9*  PLT 457* 488*   Recent Labs  Lab 05/28/19 0300 05/29/19 0956 05/30/19 0414  NA 134* 131* 135  K 4.0 4.4 3.8  CL 94* 91* 94*  CO2 26 22 26   BUN 67* 98* 32*  CREATININE 7.44* 10.09* 5.82*  CALCIUM 10.3 10.2 9.5  GLUCOSE 131* 144* 117*    Imaging/Diagnostic Tests: No new images   Caroline More, DO 05/30/2019, 8:24 AM PGY-3, [place]  Intern pager: [phone], text pages welcome

## 2019-05-30 NOTE — Progress Notes (Signed)
  [facility] Progress Note   Subjective: no new c/o. Seen by gen surg for large sacral decub  Objective Vitals:   05/29/19 1630 05/29/19 1635 05/29/19 2337 05/30/19 0820  BP: (!) 90/57 95/74 (!) 97/56 129/82  Pulse: 92 92 90 79  Resp:  16 16 18   Temp:  98.2 F (36.8 C) 98 F (36.7 C) 98.4 F (36.9 C)  TempSrc:  Oral Oral Oral  SpO2:  94% 95% 99%  Weight:      Height:       Physical Exam General:Chronically ill appearing male in NAD Heart:S1,S2 RRR Lungs:CTAB Abdomen:Active BS ND Extremities:No LE edema. Heel protectors in place Dialysis Access:L AVF + bruit   Summary: Pt is a60 y.o.yo malewith ESRD who was admitted on11/10/2020with resp failure due to COVID/pulm edema and cardiac arrest   Dialysis: [facility] 4.5h F250 124.5kg 400/800 2/2.0Ca (low Ca here for ^Ca) Hep 12000 AVF - mircera 30 q4 last 10/13 - hect 4  Assess/ Plan: 1.HxCOVIDHCAP: S/p abx - off airborne precautions. 2. Vol overload: resolved, down 22 kg on room air.  3. ESRD: TTS HD. [place] 11/18- 11/24. Now on reg HD TTS. Next HD 12/15.  4. Debility - pt has significant bilat LE/ general weakness as well as sig cognitive dysfunction as a consequence of prolonged ICU stay/ cardiac arrest. Will need to demonstrate HD in a chair here before can be dc'd to SNF or home. I have my doubts about how much he will be able to rehabilitate given his poor memory/ initiative. If not able to get stronger he will not do well on long-term dialysis.  5. AMS: Felt to be ABI. As above.  6.Anemia(ESRD + ABLA): S/p1U PRBC11/16 and 11/19. Continue max Aranesp 240mcg weekly. Hgb 8.8. 7.Secondary HPTH/hypercalcemia: Most likely d/t immobilization. VDRAheld- given calcitonin 11/25-11/26. If rises again, will consider pamidronate. Low Ca (2Ca) bath. Phos controlled. Renvela pwdr 2.4g TIW.PTH 459 start sensipar 30 mg PO daily and uptitrate. 8. ?Seizure:prev took [facility] - held this  admit. Seen by neuro. EEG W/O seizure activity. Neuro signed off.  9. Sacral Decub- much worse, seen by gen surgery today 10. Dispo - see above   Kelly Splinter, MD 05/24/2019, 4:33 PM  Meds  . acetaminophen  650 mg Oral Q4H  . allopurinol  100 mg Oral Daily  . aspirin  81 mg Oral Daily  . atorvastatin  80 mg Oral Daily  . carvedilol  12.5 mg Oral BID WC  . Chlorhexidine Gluconate Cloth  6 each Topical Q0600  . Chlorhexidine Gluconate Cloth  6 each Topical Q0600  . cinacalcet  30 mg Oral Q supper  . collagenase   Topical Daily  . darbepoetin (ARANESP) injection - DIALYSIS  200 mcg Intravenous Q Tue-HD  . feeding supplement (NEPRO CARB STEADY)  237 mL Oral TID BM  . feeding supplement (PRO-STAT SUGAR FREE 64)  30 mL Oral TID  . heparin injection (subcutaneous)  5,000 Units Subcutaneous Q8H  . multivitamin  1 tablet Oral QHS  . nutrition supplement (JUVEN)  1 packet Oral BID BM  . sevelamer carbonate  2.4 g Oral TID WC

## 2019-05-30 NOTE — Consult Note (Signed)
[facility] Surgery Consult  Note  [place] Wells July 11, 1965  [id number].    Requesting MD: Madison Hickman Chief Complaint: PEA arrest, with sacral decubitus Reason for Consult: Sacral decubitus   HPI:  Patient is a 53 year old male who was admitted on 04/14/2019 with acute hypoxic respiratory failure secondary to Covid pneumonia, and NSTEMI additional problems include end-stage renal disease, HD MWF, Hx CHF, hypertension, Hx of OSA with CPAP, type 2 diabetes, diabetic neuropathy, gout, diabetic neuropathy.  He was ultimately discharged 04/20/2019.  He returned on 04/27/2019 via EMS.  He developed shortness of breath and dialysis center and became unresponsive.  He developed PEA and CPR was started in route to the emergency department.  They reported 13 minutes of CPR and it looks like he was admitted by CCM.  He required full vent support, ongoing therapy for his other multiple medical issues.  EEG on 04/28/2019 was suggestive of profound diffuse encephalopathy.  MRI on 1115 was suggestive of anoxic brain injury.  Neurology signed off on 05/13/2019.  On 05/23/2019 today noticed some left upper extremity and sacral ulcer.  He was seen by wound care on 05/24/2019 and had a 6 x 3 x 4 cm sacral ulcer with tan malodorous drainage.  He was placed on hydrotherapy and Dakin solution.  We have been asked to see today and evaluate for possible wound VAC placement. ROS: Review of Systems  All other systems reviewed and are negative.   Family History  Problem Relation Age of Onset  . Hypertension Mother   . Heart disease Mother 46       cause of death  . Hypertension Father   . Aneurysm Father 2       brain aneurysm  . Heart disease Father 68       cause of death  . Hypertension Brother   . Alcohol abuse Brother   . Diabetes Brother   . Pancreatitis Brother   . Hypertension Brother   . Colon cancer Neg Hx     Past Medical History:  Diagnosis Date  . CARDIAC ARREST 11/01/2009   Qualifier: Diagnosis of  By: Selena Batten CMA, Jewel    . Colon polyps 01/02/2016   Colonoscopy July 2017 - One 3 mm polyp in the transverse colon, removed with a cold biopsy forceps. Resected and retrieved. - One 3 mm polyp in the rectum, removed with a cold biopsy forceps. Resected and retrieved. - Diverticulosis in the entire examined colon. - Non-bleeding internal hemorrhoids. - The examination was otherwise normal. - Significant looping which prolonged cecal    . Coronary artery disease 08/15/2009   with AMI  . Dialysis patient ([facility]) 09/27/2014   mon, wed, and fri  . Diverticulosis of colon without hemorrhage 01/02/2016   Colonoscopy July 2017 - One 3 mm polyp in the transverse colon, removed with a cold biopsy forceps. Resected and retrieved. - One 3 mm polyp in the rectum, removed with a cold biopsy forceps. Resected and retrieved. - Diverticulosis in the entire examined colon. - Non-bleeding internal hemorrhoids. - The examination was otherwise normal. - Significant looping which prolonged cecal    . DM (diabetes mellitus), type 2 with renal complications ([facility]) 29/51/8841  . Essential hypertension 11/01/2009   Qualifier: Diagnosis of  By: Selena Batten CMA, Jewel    . Hyperlipidemia   . Hypertension   . Mitral valve regurgitation 09/19/2015   Echo 09/2015   . OSA treated with BiPAP 06/17/2010  . Sleep apnea    cpap nightly  Past Surgical History:  Procedure Laterality Date  . AV FISTULA PLACEMENT Left 09/27/2014  . INSERTION OF ARTERIOVENOUS (AV) ARTEGRAFT ARM Left 04/02/2018   Procedure: INSERTION OF ARTERIOVENOUS (AV) ARTEGRAFT ARM LEFT UPPER ARM;  Surgeon: Waynetta Sandy, MD;  Location: [facility];  Service: Vascular;  Laterality: Left;  . INSERTION OF DIALYSIS CATHETER N/A 04/02/2018   Procedure: INSERTION OF DIALYSIS CATHETER, right internal jugular;  Surgeon: Waynetta Sandy, MD;  Location: [facility];  Service: Vascular;  Laterality: N/A;  . NECK SURGERY     C6  & C7 replaced 30 yrs ago per pt  . REVISON OF ARTERIOVENOUS FISTULA Left 04/02/2018   Procedure: REVISION PLICATION OF ARTERIOVENOUS FISTULA ARM;  Surgeon: Waynetta Sandy, MD;  Location: [facility];  Service: Vascular;  Laterality: Left;  . WISDOM TOOTH EXTRACTION      Social History:  reports that he quit smoking about 7 years ago. His smoking use included cigars. He has a 10.00 pack-year smoking history. He has never used smokeless tobacco. He reports current alcohol use of about 1.0 standard drinks of alcohol per week. He reports that he does not use drugs.  Allergies: No Known Allergies  Medications Prior to Admission  Medication Sig Dispense Refill  . albuterol (VENTOLIN HFA) 108 (90 Base) MCG/ACT inhaler Inhale 1-2 puffs into the lungs every 6 (six) hours as needed for wheezing or shortness of breath. 6.7 g 0  . allopurinol (ZYLOPRIM) 100 MG tablet Take 100 mg by mouth 2 (two) times daily.     Marland Kitchen amLODipine (NORVASC) 10 MG tablet Take 10 mg by mouth daily.    Marland Kitchen aspirin 81 MG EC tablet Take 81 mg by mouth daily.    Marland Kitchen atorvastatin (LIPITOR) 80 MG tablet Take 1 tablet (80 mg total) by mouth daily. 30 tablet 0  . AURYXIA 1 GM 210 MG(Fe) tablet Take 420-840 mg by mouth See admin instructions. 840 mg with meals, and 420 mg with snacks    . carvedilol (COREG) 25 MG tablet Take 1 tablet (25 mg total) by mouth 2 (two) times daily with a meal. 60 tablet 2  . cetirizine (ZYRTEC) 5 MG tablet Take 1 tablet (5 mg total) by mouth daily. (Patient taking differently: Take 5 mg by mouth daily as needed for allergies. ) 90 tablet 1  . colchicine 0.6 MG tablet Take 0.6 mg by mouth 2 (two) times daily as needed (gout).     Marland Kitchen gabapentin (NEURONTIN) 300 MG capsule TAKE ONE CAPSULE BY MOUTH THREE TIMES DAILY (Patient taking differently: Take 300 mg by mouth at bedtime. ) 90 capsule 2  . glipiZIDE (GLUCOTROL) 5 MG tablet Take 5 mg by mouth 2 (two) times daily.     Marland Kitchen linagliptin (TRADJENTA) 5 MG TABS tablet  Take 1 tablet (5 mg total) by mouth daily. 30 tablet 0  . minoxidil (LONITEN) 10 MG tablet Take 2 tablets (20 mg total) by mouth 2 (two) times daily. 360 tablet 3  . multivitamin (RENA-VIT) TABS tablet Take 1 tablet by mouth at bedtime.    Marland Kitchen PRESCRIPTION MEDICATION Pt has CPAP machine    . torsemide (DEMADEX) 100 MG tablet Take 100 mg by mouth See admin instructions. Take on Tuesday, Thursday, Saturday, and Sunday       Blood pressure 129/82, pulse 79, temperature 98.4 F (36.9 C), temperature source Oral, resp. rate 18, height 5\' 10"  (1.778 m), weight 90.9 kg, SpO2 99 %. Physical Exam: Physical Exam Constitutional:  General: He is not in acute distress.    Appearance: He is normal weight. He is ill-appearing. He is not toxic-appearing or diaphoretic.  HENT:     Head: Normocephalic and atraumatic.     Mouth/Throat:     Pharynx: Oropharynx is clear.  Eyes:     General: No scleral icterus.    Conjunctiva/sclera: Conjunctivae normal.     Comments: Pupils are equal  Neck:     Vascular: No carotid bruit.  Cardiovascular:     Rate and Rhythm: Normal rate and regular rhythm.     Pulses: Normal pulses.     Heart sounds: Normal heart sounds.  Pulmonary:     Effort: Pulmonary effort is normal.     Breath sounds: Normal breath sounds.  Abdominal:     General: Bowel sounds are normal. There is no distension.     Palpations: Abdomen is soft.  Musculoskeletal:        General: No swelling or tenderness.     Cervical back: No rigidity. No muscular tenderness.  Lymphadenopathy:     Cervical: No cervical adenopathy.  Skin:    General: Skin is warm and dry.     Comments: See picture with explanation below for the sacral decubitus.  He also has an ulcer on his left shoulder measures about 4 x 3 cm.  Dressings are off and is about to be replaced.  Neurological:     General: No focal deficit present.     Mental Status: He is alert.     Comments: He is showing some tremors while lying on  his side but is alert and oriented.  Despite diagnosis of anoxic brain injury he seems to understand exactly what is going on.  His speech is clear, and his answers are appropriate.  Psychiatric:        Mood and Affect: Mood normal.        Behavior: Behavior normal.        Thought Content: Thought content normal.        Judgment: Judgment normal.     Pictures taken with the patient on his right side.  The decubitus measures 6 x 4 x 4.  On exam today dressings been in place since last evening.  You can still see a moderate amount of drainage.  The site also tracks cranially about 4 cm.  For the most part it is pretty clean but it is extremely tender and sensitive.  Results for orders placed or performed during the hospital encounter of 04/27/19 (from the past 48 hour(s))  Renal function panel     Status: Abnormal   Collection Time: 05/29/19  9:56 AM  Result Value Ref Range   Sodium 131 (L) 135 - 145 mmol/L   Potassium 4.4 3.5 - 5.1 mmol/L   Chloride 91 (L) 98 - 111 mmol/L   CO2 22 22 - 32 mmol/L   Glucose, Bld 144 (H) 70 - 99 mg/dL   BUN 98 (H) 6 - 20 mg/dL   Creatinine, Ser 10.09 (H) 0.61 - 1.24 mg/dL   Calcium 10.2 8.9 - 10.3 mg/dL   Phosphorus 6.3 (H) 2.5 - 4.6 mg/dL   Albumin 2.7 (L) 3.5 - 5.0 g/dL   GFR calc non Af Amer 5 (L) >60 mL/min   GFR calc Af Amer 6 (L) >60 mL/min   Anion gap 18 (H) 5 - 15    Comment: Performed at [facility], [address] [place], [place] [address]  Renal  function panel     Status: Abnormal   Collection Time: 05/30/19  4:14 AM  Result Value Ref Range   Sodium 135 135 - 145 mmol/L   Potassium 3.8 3.5 - 5.1 mmol/L   Chloride 94 (L) 98 - 111 mmol/L   CO2 26 22 - 32 mmol/L   Glucose, Bld 117 (H) 70 - 99 mg/dL   BUN 32 (H) 6 - 20 mg/dL   Creatinine, Ser 5.82 (H) 0.61 - 1.24 mg/dL    Comment: DELTA CHECK NOTED DIALYSIS    Calcium 9.5 8.9 - 10.3 mg/dL   Phosphorus 4.8 (H) 2.5 - 4.6 mg/dL   Albumin 2.6 (L) 3.5 - 5.0 g/dL   GFR calc non Af  Amer 10 (L) >60 mL/min   GFR calc Af Amer 12 (L) >60 mL/min   Anion gap 15 5 - 15    Comment: Performed at [facility] [address] [place], [place] [address]   No results found.     Assessment/Plan PEA arrest with CPR Anoxic brain injury -recovering Covid Positve 04/14/2019 End-stage renal disease HD MWF NSTEMI 1028-04/20/19 Hx CHF Hx COPD Hx OSA with CPAP Type 2 diabetes Diabetic neuropathy Hx gout   Sacral decubitus 6 x 4 x 4 cm with undermining/tracking  -See picture below above, currently undergoing hydrotherapy  -He still has some tracking and ongoing drainage.  -Wet-to-dry dressings twice daily  FEN: D3 diet ID: None DVT: Heparin Follow-up: TBD  Plan: We are asked to see and evaluate for wound VAC placement.  He still has a moderate amount of drainage with some tracking and undermining.  I am not sure we can adequately monitor these areas that are tracking, and extremely sensitive.  For now I would continue to hydrotherapy and twice daily dressing changes.  We will review with Dr. Grandville Silos and Dr. Donne Hazel who will be on next week.  Earnstine Regal [facility] Surgery 05/30/2019, 11:56 AM Please see Amion for pager number during day hours 7:00am-4:30pm

## 2019-05-30 NOTE — Progress Notes (Signed)
Pt refusing to wear cpap for the night. RT will continue to monitor as needed. 

## 2019-05-30 NOTE — Progress Notes (Addendum)
Pt sat in chair for 1 hour today while eating lunch.  Pt unable to tolerate sitting in chair for longer than 1 hour and requested to be transferred back to the bed.

## 2019-05-31 LAB — RENAL FUNCTION PANEL
Albumin: 2.6 g/dL — ABNORMAL LOW (ref 3.5–5.0)
Anion gap: 16 — ABNORMAL HIGH (ref 5–15)
BUN: 52 mg/dL — ABNORMAL HIGH (ref 6–20)
CO2: 26 mmol/L (ref 22–32)
Calcium: 10.4 mg/dL — ABNORMAL HIGH (ref 8.9–10.3)
Chloride: 93 mmol/L — ABNORMAL LOW (ref 98–111)
Creatinine, Ser: 7.89 mg/dL — ABNORMAL HIGH (ref 0.61–1.24)
GFR calc Af Amer: 8 mL/min — ABNORMAL LOW (ref >60)
GFR calc non Af Amer: 7 mL/min — ABNORMAL LOW (ref >60)
Glucose, Bld: 102 mg/dL — ABNORMAL HIGH (ref 70–99)
Phosphorus: 5.8 mg/dL — ABNORMAL HIGH (ref 2.5–4.6)
Potassium: 4.5 mmol/L (ref 3.5–5.1)
Sodium: 135 mmol/L (ref 135–145)

## 2019-05-31 MED ORDER — MORPHINE SULFATE (PF) 2 MG/ML IV SOLN
2.0000 mg | INTRAVENOUS | Status: DC | PRN
  Administered 2019-06-01 – 2019-06-14 (×11): 2 mg via INTRAVENOUS
  Administered 2019-06-14: 4 mg via INTRAVENOUS
  Administered 2019-06-15: 2 mg via INTRAVENOUS
  Filled 2019-05-31 (×13): qty 1
  Filled 2019-05-31: qty 2
  Filled 2019-05-31 (×2): qty 1

## 2019-05-31 MED ORDER — CHLORHEXIDINE GLUCONATE CLOTH 2 % EX PADS
6.0000 | MEDICATED_PAD | Freq: Every day | CUTANEOUS | Status: DC
  Administered 2019-06-02 – 2019-06-03 (×2): 6 via TOPICAL

## 2019-05-31 NOTE — Final Consult Note (Addendum)
Consultant Final Sign-Off Note    Assessment/Final recommendations  Troy Wells is a 53 y.o. male followed by me for:  Sacral decubitus 6 x 4 x 4 cm with undermining/tracking  - continue hydrotherapy and BID wet to dry dressing changes while inpatient if no vac is placed - okay for vac from our standpoint if [place] feels appropriate, difficult location to place a vac   Wound care (if applicable): vac if WOC feels appropriate, continue BID wet to dry dressing changes if no vac   Diet at discharge: per primary team   Activity at discharge: per primary team   Follow-up appointment:  None needed   Pending results:  Unresulted Labs (From admission, onward)    Start     Ordered   05/27/19 0902  SARS CORONAVIRUS 2 (TAT 6-24 HRS) Nasopharyngeal Nasopharyngeal Swab  Once,   R    Question Answer Comment  Is this test for diagnosis or screening Screening   Symptomatic for COVID-19 as defined by CDC No   Hospitalized for COVID-19 No   Admitted to ICU for COVID-19 No   Previously tested for COVID-19 Yes   Resident in a congregate (group) care setting No   Employed in healthcare setting No   Required for discharge to: SNF/facility placement      05/27/19 0902   Signed and Held  Renal function panel  Once,   R    Question:  Specimen collection method  Answer:  Lab=Lab collect   Signed and Held   Signed and Held  CBC  Once,   R    Question:  Specimen collection method  Answer:  Lab=Lab collect   Signed and Held   Signed and Held  Renal function panel  Once,   R    Question:  Specimen collection method  Answer:  Lab=Lab collect   Signed and Held   Signed and Held  CBC  Once,   R    Question:  Specimen collection method  Answer:  Lab=Lab collect   Signed and Held           Medication recommendations:   Other recommendations:    Thank you for allowing [place] to participate in the care of your patient!  Please consult [place] again if you have further needs for your patient.  Autymn Omlor L  Jarissa Sheriff 05/31/2019 8:02 AM    Subjective   CC: pain of sacral wound  No issues overnight.   Objective  Vital signs in last 24 hours: Temp:  [98 F (36.7 C)-98.7 F (37.1 C)] 98 F (36.7 C) (12/13 2242) Pulse Rate:  [79-86] 82 (12/13 2242) Resp:  [16-20] 20 (12/13 2242) BP: (101-129)/(64-82) 121/64 (12/13 2242) SpO2:  [98 %-99 %] 99 % (12/13 2242)  PE: General: well appearing, NAD, cooperative Lungs: rate and effort normal GU: sacral wound is clean and without purulent drainage, see photo below       Pertinent labs and Studies: No results for input(s): WBC, HGB, HCT in the last 72 hours. BMET Recent Labs    05/30/19 0414 05/31/19 0330  NA 135 135  K 3.8 4.5  CL 94* 93*  CO2 26 26  GLUCOSE 117* 102*  BUN 32* 52*  CREATININE 5.82* 7.89*  CALCIUM 9.5 10.4*   No results for input(s): LABURIN in the last 72 hours. Results for orders placed or performed during the hospital encounter of 04/27/19  Culture, blood (routine x 2)     Status: None   Collection Time: 04/27/19  1:07 PM   Specimen: BLOOD  Result Value Ref Range Status   Specimen Description BLOOD BLOOD LEFT HAND  Final   Special Requests   Final    BOTTLES DRAWN AEROBIC AND ANAEROBIC Blood Culture adequate volume   Culture   Final    NO GROWTH 5 DAYS Performed at [facility], [address] [place], [place] [address]    Report Status 05/02/2019 FINAL  Final  Culture, blood (routine x 2)     Status: None   Collection Time: 04/27/19  5:08 PM   Specimen: BLOOD  Result Value Ref Range Status   Specimen Description BLOOD RIGHT ANTECUBITAL  Final   Special Requests   Final    BOTTLES DRAWN AEROBIC AND ANAEROBIC Blood Culture adequate volume   Culture   Final    NO GROWTH 5 DAYS Performed at [facility], [place] [address] [place], [place] [address]    Report Status 05/02/2019 FINAL  Final  SARS CORONAVIRUS 2 (TAT 6-24 HRS) Nasopharyngeal Nasopharyngeal Swab     Status: Abnormal    Collection Time: 05/05/19  9:45 AM   Specimen: Nasopharyngeal Swab  Result Value Ref Range Status   SARS Coronavirus 2 POSITIVE (A) NEGATIVE Final    Comment: RESULT CALLED TO, READ BACK BY AND VERIFIED WITH: Geroge Baseman RN 16:45 05/05/19 ([email or web address]) (NOTE) SARS-CoV-2 target nucleic acids are DETECTED. The SARS-CoV-2 RNA is generally detectable in upper and lower respiratory specimens during the acute phase of infection. Positive results are indicative of active infection with SARS-CoV-2. Clinical  correlation with patient history and other diagnostic information is necessary to determine patient infection status. Positive results do  not rule out bacterial infection or co-infection with other viruses. The expected result is Negative. Fact Sheet for Patients: SugarRoll.be Fact Sheet for Healthcare Providers: https://www.woods-mathews.com/ This test is not yet approved or cleared by the [place] FDA and  has been authorized for detection and/or diagnosis of SARS-CoV-2 by FDA under an Emergency Use Authorization (EUA). This EUA will remain  in effect (meaning this test can be used)  for the duration of the COVID-19 declaration under Section 564(b)(1) of the Act, 21 U.S.C. section 360bbb-3(b)(1), unless the authorization is terminated or revoked sooner. Performed at [facility], [place] [address], [place], [place] [address]   Culture, respiratory (non-expectorated)     Status: None   Collection Time: 05/05/19 12:35 PM   Specimen: Tracheal Aspirate; Respiratory  Result Value Ref Range Status   Specimen Description TRACHEAL ASPIRATE  Final   Special Requests NONE  Final   Gram Stain   Final    RARE WBC PRESENT, PREDOMINANTLY PMN ABUNDANT GRAM POSITIVE RODS RARE GRAM NEGATIVE RODS Performed at [facility], [place] [address], [place], [place] [address]    Culture FEW ENTEROBACTER CLOACAE  Final   Report Status 05/07/2019 FINAL   Final   Organism ID, Bacteria ENTEROBACTER CLOACAE  Final      Susceptibility   Enterobacter cloacae - MIC*    CEFAZOLIN >=64 RESISTANT Resistant     CEFEPIME <=1 SENSITIVE Sensitive     CEFTAZIDIME <=1 SENSITIVE Sensitive     CEFTRIAXONE <=1 SENSITIVE Sensitive     CIPROFLOXACIN <=0.25 SENSITIVE Sensitive     GENTAMICIN <=1 SENSITIVE Sensitive     IMIPENEM <=0.25 SENSITIVE Sensitive     TRIMETH/SULFA <=20 SENSITIVE Sensitive     PIP/TAZO <=4 SENSITIVE Sensitive     * FEW ENTEROBACTER CLOACAE  Culture, blood (  routine x 2)     Status: None   Collection Time: 05/19/19 12:00 PM   Specimen: BLOOD RIGHT HAND  Result Value Ref Range Status   Specimen Description BLOOD RIGHT HAND  Final   Special Requests   Final    BOTTLES DRAWN AEROBIC ONLY Blood Culture adequate volume   Culture   Final    NO GROWTH 5 DAYS Performed at [facility], [address] [place], [place] [address]    Report Status 05/24/2019 FINAL  Final  Culture, blood (routine x 2)     Status: None   Collection Time: 05/19/19 12:05 PM   Specimen: BLOOD RIGHT HAND  Result Value Ref Range Status   Specimen Description BLOOD RIGHT HAND  Final   Special Requests   Final    BOTTLES DRAWN AEROBIC ONLY Blood Culture results may not be optimal due to an inadequate volume of blood received in culture bottles   Culture   Final    NO GROWTH 5 DAYS Performed at [facility], [place] [address] [place], [place] [address]    Report Status 05/24/2019 FINAL  Final  SARS CORONAVIRUS 2 (TAT 6-24 HRS) Nasopharyngeal Nasopharyngeal Swab     Status: None   Collection Time: 05/21/19 12:56 PM   Specimen: Nasopharyngeal Swab  Result Value Ref Range Status   SARS Coronavirus 2 NEGATIVE NEGATIVE Final    Comment: (NOTE) SARS-CoV-2 target nucleic acids are NOT DETECTED. The SARS-CoV-2 RNA is generally detectable in upper and lower respiratory specimens during the acute phase of infection. Negative results do not preclude  SARS-CoV-2 infection, do not rule out co-infections with other pathogens, and should not be used as the sole basis for treatment or other patient management decisions. Negative results must be combined with clinical observations, patient history, and epidemiological information. The expected result is Negative. Fact Sheet for Patients: SugarRoll.be Fact Sheet for Healthcare Providers: https://www.woods-mathews.com/ This test is not yet approved or cleared by the [place] FDA and  has been authorized for detection and/or diagnosis of SARS-CoV-2 by FDA under an Emergency Use Authorization (EUA). This EUA will remain  in effect (meaning this test can be used) for the duration of the COVID-19 declaration under Section 56 4(b)(1) of the Act, 21 U.S.C. section 360bbb-3(b)(1), unless the authorization is terminated or revoked sooner. Performed at [facility], [place] [address], [place], [place] [address]   SARS CORONAVIRUS 2 (TAT 6-24 HRS) Nasopharyngeal Nasopharyngeal Swab     Status: None   Collection Time: 05/24/19 11:15 AM   Specimen: Nasopharyngeal Swab  Result Value Ref Range Status   SARS Coronavirus 2 NEGATIVE NEGATIVE Final    Comment: (NOTE) SARS-CoV-2 target nucleic acids are NOT DETECTED. The SARS-CoV-2 RNA is generally detectable in upper and lower respiratory specimens during the acute phase of infection. Negative results do not preclude SARS-CoV-2 infection, do not rule out co-infections with other pathogens, and should not be used as the sole basis for treatment or other patient management decisions. Negative results must be combined with clinical observations, patient history, and epidemiological information. The expected result is Negative. Fact Sheet for Patients: SugarRoll.be Fact Sheet for Healthcare Providers: https://www.woods-mathews.com/ This test is not yet approved or  cleared by the [place] FDA and  has been authorized for detection and/or diagnosis of SARS-CoV-2 by FDA under an Emergency Use Authorization (EUA). This EUA will remain  in effect (meaning this test can be used) for the duration of the COVID-19 declaration  under Section 56 4(b)(1) of the Act, 21 U.S.C. section 360bbb-3(b)(1), unless the authorization is terminated or revoked sooner. Performed at [facility], [place] [address] [place], [place] [address]     Imaging: No results found.

## 2019-05-31 NOTE — Progress Notes (Signed)
Physical Therapy Wound Treatment and Discharge Patient Details  Name: Troy Wells MRN: [id number] Date of Birth: 11/04/1965  Today's Date: 05/31/2019 Time: 1129-1227 Time Calculation (min): 58 min  Subjective  Subjective: very painful but agreeable  Patient and Family Stated Goals: None stated Prior Treatments: Dakin's and dressing changes  Pain Score:  Pt extremely painful and not tolerating hydro treatments well even with premedication for pain.   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 evolved to stage 4 when assessed on 12/14 (Active)  Wound Image   05/31/19 1300  Dressing Type ABD;Barrier Film (skin prep);Gauze (Comment);Moist to dry 05/31/19 1300  Dressing Changed;Clean;Dry;Intact 05/31/19 1300  Dressing Change Frequency Daily 05/31/19 1300  State of Healing Early/partial granulation 05/31/19 1300  Site / Wound Assessment Red;Yellow;Other (Comment) 05/31/19 1300  % Wound base Red or Granulating 85% 05/31/19 1300  % Wound base Yellow/Fibrinous Exudate 5% 05/31/19 1300  % Wound base Black/Eschar 0% 05/31/19 0030  % Wound base Other/Granulation Tissue (Comment) 10% 05/31/19 1300  Peri-wound Assessment Intact;Pink 05/31/19 1300  Wound Length (cm) 7.4 cm 05/25/19 1000  Wound Width (cm) 5.2 cm 05/25/19 1000  Wound Depth (cm) 3.2 cm 05/25/19 1000  Wound Surface Area (cm^2) 38.48 cm^2 05/25/19 1000  Wound Volume (cm^3) 123.14 cm^3 05/25/19 1000  Undermining (cm) 1.8 cm from 12:00-5:00 05/25/19 1242  Margins Unattached edges (unapproximated) 05/31/19 1300  Drainage Amount Minimal 05/31/19 1300  Drainage Description Serosanguineous 05/31/19 1300  Treatment Hydrotherapy (Pulse lavage);Packing (Saline gauze) 05/31/19 1300   Santyl applied to wound bed prior to applying dressing.     Hydrotherapy Pulsed lavage therapy - wound location: Sacrum Pulsed Lavage with Suction (psi): 4  psi Pulsed Lavage with Suction - Normal Saline Used: 500 mL Pulsed Lavage Tip: Tip with splash shield Selective Debridement Selective Debridement - Location: Sacrum Selective Debridement - Tools Used: Forceps;Scissors Selective Debridement - Tissue Removed: Yellow necrotic tissue   Wound Assessment and Plan  Wound Therapy - Assess/Plan/Recommendations  Wound Therapy - Clinical Statement: Wound bed appears mostly granulated with only a small amount of slough present. Slightly larger area of healthy appearing fascia ~8:00-10:00, and very minimal slough at 12:00. Pt continues to be very intolerant of hydrotherapy sessions due to pain, even with premedication. Hydrotherapy will sign off at this time as wound bed is overall clean. RN notified. If needs change, please reconsult.   Wound Therapy - Functional Problem List: Decreased tolerance for OOB and positioning due to decreased skin integrity.  Factors Delaying/Impairing Wound Healing: Diabetes Mellitus;Incontinence;Immobility;Multiple medical problems Hydrotherapy Plan: Debridement;Dressing change;Patient/family education;Pulsatile lavage with suction Wound Therapy - Frequency: 6X / week Wound Therapy - Follow Up Recommendations: Home health RN Wound Plan: See above  Wound Therapy Goals- Improve the function of patient's integumentary system by progressing the wound(s) through the phases of wound healing (inflammation - proliferation - remodeling) by: Decrease Necrotic Tissue to: 0 Decrease Necrotic Tissue - Progress: Partly met Increase Granulation Tissue to: 100% Increase Granulation Tissue - Progress: Partly met Improve Drainage Characteristics: Min;Serous Improve Drainage Characteristics - Progress: Progressing toward goal Goals/treatment plan/discharge plan were made with and agreed upon by patient/family: Yes Time For Goal Achievement: 7 days Wound Therapy - Potential for Goals: Excellent  Goals will be updated until maximal  potential achieved or discharge criteria met.  Discharge criteria: when goals achieved, discharge from hospital, MD decision/surgical intervention, no progress towards goals, refusal/missing three consecutive treatments without notification or medical   reason.  GP     Laura D Kirkman 05/31/2019, 1:15 PM   Laura Kirkman, PT, DPT Acute Rehabilitation Services Pager: [phone] Office: [phone]    

## 2019-05-31 NOTE — Progress Notes (Signed)
PT Cancellation Note  Patient Details Name: Troy Wells MRN: [id number] DOB: 01-Mar-1966   Cancelled Treatment:    Reason Eval/Treat Not Completed: Fatigue/lethargy limiting ability to participate.  Pt agreed to PT dropping by later today to reattempt, so will retry as time and pt allow.   Ramond Dial 05/31/2019, 10:17 AM   Mee Hives, PT MS Acute Rehab Dept. Number: [place] [facility] [place]

## 2019-05-31 NOTE — Progress Notes (Addendum)
Family Medicine Teaching Service Daily Progress Note Intern Pager: [phone]  Patient name: Troy Wells Medical record number: [id number] Date of birth: December 31, 1965 Age: 53 y.o. Gender: male  Primary Care Provider: [name], MD Consultants: Neurology, critical care, nephrology Code Status: Full   Pt Overview and Major Events to Date:  04/27/2019 admitted to ICU 05/02/2019 MRI brain showing anoxic brain injury 05/10/2019: Extubated 05/12/2019: Started receiving care under FPTS 05/20/2019: EEG w/o seizure, Neuro signed off 05/21/2019: Stable for discharge to SNF  Assessment and Plan: Troy Wells is a 53 y.o. male transferred from ICU after cardiac arrest with recent history of COVID-19 infection.  PMH significant for ESRD MWF on HD, OSA, HTN, HLD, HFpEF, pHTN, type 2 diabetes, and diabetic neuropathy.  Encephalopathy  Possible anoxic brain injury PEA cardiac arrest Patient continues to have difficulty sitting.  Dispo to SNF pending ability to maintain upright position during HD.  Remains stable without significant improvement in overall status. -PT/OT consulted, appreciate recommendations -Monitor for improvement - SNF dispo  LUE/sacral ulcer Occurred while bed bound in ICU.  PT performing hydrotherapy.  Complicates patient's ability to sit in chair for HD.  Will need to demonstrate this ability prior to discharge to SNF.  Surgery was consulted for possible wound vac, saw patient on 12/13, recommended continued hydrotherapy and dressing changes BID.  This AM, states can apply wound vac if wound care deems appropriate.  Has prn oxy for hydrotherapy pain tolerance. -Up to chair and sit as tolerated -Continue wound care daily  -tylenol PRN -oxy PRN for hydrotherapy  -Continue turning patient  q2h more on the right side due to left shoulder wond -gen surgery consulted, awaiting recommendations  COVIDEnterobacterHAP  Leukocytosis (Resolved) Patient now greater  than 21 days out from Covid diagnosis. Has been cleared by infectious disease. Repeat Covid test on 05/24/2019 is negative.  -may need repeat COVID prior to dc to SNF   ESRD on HDHypercalcemia (Resolved) Secondary hyperparathyroidism Dialysis per nephro TTS schedule. Last HD 12/12 per nephro note, plan for next HD session 12/15. -Nephrology following, appreciate recs -Daily renal function panel -Aranesp every Tuesday with HD -senispar 30mg  daily, renvela 2.4g TID  HFmEF CAD [facility] 04/27/19 40 to 45%. LV with mild to moderate decreased fxn,mildly increased LVH,inferiolateral akinesis and anterolateral severe hypokinesis. Grade 2 diastolic dysfunction.  -Continue aspirin 81 -Continue Lipitor 80 mg daily -Coreg 12.5 mg twice daily  Hypertension  Tachycardia Hypotensive, BP 97/56. HR 90. -Continue Coreg 12.5 mg twice daily   OSA with use of CPAP Currently on RA 98%. OSA at baseline and uses CPAP intermittently. Now 21+ days out from Covid diagnosis, can use CPAP if needed. Refused CPAP ON.  -Continue to encourage patient to use CPAP at bedtime  Diabetes Home medications include glipizide 5 mg BID.Last A1c 5.8 on 04/14/2019. Inpatient glucose has been stable.   -holding home glipizide -would consider discontinuing glipizide on dc to avoid hypoglycemia  HLD Home medications include atorvastatin 40mg  -Atorvastatin 80mg  daily  Diabetic Neuropathy  Patient on home gabapentin 300 mg TID. Patient reports only taking BID.  -continue to hold homegabapentin  Gout  Home medications include allopurinol 100mg  daily and colchicine 0.6mg  BID as needed when symptomatic. -Continue home allopurinol  Social/Disposition [facility], may not be available now, patient still needs improvement before discharge -Discharge to SNF when stable  FEN/GI: Dysphagia 3 diet OKH:TXHFSFS 5000 units subq 3 times daily  Disposition: pending ability to  tolerate sitting at HD  Subjective:  Patient notes some pain in his sacrum this AM, but states nurse just left to get his pain medication.  Denies any other complaints.  Objective: Temp:  [98 F (36.7 C)-98.7 F (37.1 C)] 98 F (36.7 C) (12/13 2242) Pulse Rate:  [79-86] 82 (12/13 2242) Resp:  [16-20] 20 (12/13 2242) BP: (101-129)/(64-82) 121/64 (12/13 2242) SpO2:  [98 %-99 %] 99 % (12/13 2242)  Physical Exam:  General: 53 y.o. male in NAD Cardio: RRR no m/r/g Lungs: CTAB, no wheezing, no rhonchi, no crackles, no IWOB on RA Abdomen: Soft, non-tender to palpation, non-distended Skin: warm and dry Extremities: No edema, able to move b/l feet Neuro: alert and oriented to self and aware he is in a hospital, but does not know location of hospital, year, or reason for admission   Laboratory: Recent Labs  Lab 05/25/19 1253 05/27/19 0236  WBC 10.6* 10.0  HGB 8.1* 8.8*  HCT 24.9* 27.9*  PLT 457* 488*   Recent Labs  Lab 05/29/19 0956 05/30/19 0414 05/31/19 0330  NA 131* 135 135  K 4.4 3.8 4.5  CL 91* 94* 93*  CO2 22 26 26   BUN 98* 32* 52*  CREATININE 10.09* 5.82* 7.89*  CALCIUM 10.2 9.5 10.4*  GLUCOSE 144* 117* 102*    Imaging/Diagnostic Tests: No new images   Troy Dunker, DO 05/31/2019, 7:37 AM PGY-2, [place] Intern pager: [phone], text pages welcome

## 2019-05-31 NOTE — Progress Notes (Signed)
Patient refusing CPAP for night.

## 2019-05-31 NOTE — Progress Notes (Signed)
  [facility] Progress Note   Subjective:   Last HD on 12/12 with 1 kg UF.  Slept ok.  Some discomfort of his backside - seen by surgery.    Review of systems:  Denies shortness of breath or chest pain  Denies n/v States taking po   Objective Vitals:   05/29/19 2337 05/30/19 0820 05/30/19 1757 05/30/19 2242  BP: (!) 97/56 129/82 101/65 121/64  Pulse: 90 79 86 82  Resp: 16 18 16 20   Temp: 98 F (36.7 C) 98.4 F (36.9 C) 98.7 F (37.1 C) 98 F (36.7 C)  TempSrc: Oral Oral Oral Oral  SpO2: 95% 99% 98% 99%  Weight:      Height:       Physical Exam   General:Chronically ill appearing male in NAD Heart:S1,S2 RRR Lungs:CTAB unlabored Abdomen:soft/NT ND Extremities:No LE edema.  Dialysis Access:L AVF + bruit and thrill  Psych - calm mood and affect    Summary: Pt is a69 y.o.yo malewith ESRD who was admitted on11/10/2020with resp failure due to COVID/pulm edema and cardiac arrest   Dialysis: [facility] 4.5h F250 124.5kg 400/800 2/2.0Ca (low Ca here for ^Ca) Hep 12000 AVF - mircera 30 q4 last 10/13 - hect 4  Assess/ Plan: 1.HxCOVIDHCAP: S/p abx - off airborne precautions. 2. Vol overload: resolved; weight significantly down (reported as 22 kg down on 12/13) and on room air 3. ESRD: TTS HD. [facility] 11/18- 11/24. Now on reg HD TTS 4. Debility - pt has significant bilat LE/ general weakness as well as sig cognitive dysfunction as a consequence of prolonged ICU stay/ cardiac arrest. Will need to demonstrate HD in a chair here before can be dc'd to SNF or home.  Concern regarding memory/initiative.  If not able to get stronger he will not do well on long-term dialysis.  5. AMS: Felt to be ABI. As above.  6.Anemia(ESRD + ABLA): S/p1U PRBC11/16 and 11/19. Continue max Aranesp 227mcg weekly. 7.Secondary HPTH/hypercalcemia: Most likely d/t immobilization. VDRAheld- given calcitonin 11/25-11/26.  Low Ca (2Ca) bath. Phos controlled.  Renvela pwdr 2.4g TIW.PTH 459; he was started on sensipar 30 mg PO daily on 12/6.    8. ?Seizure:prev took Keppra - held this admit. Seen by neuro. EEG W/O seizure activity. Neuro signed off.  9. Sacral Decub- s/p surgery eval  10. Dispo - see above   Claudia Desanctis 05/31/2019 8:23 AM     Meds  . acetaminophen  650 mg Oral Q4H  . allopurinol  100 mg Oral Daily  . aspirin  81 mg Oral Daily  . atorvastatin  80 mg Oral Daily  . carvedilol  12.5 mg Oral BID WC  . Chlorhexidine Gluconate Cloth  6 each Topical Q0600  . cinacalcet  30 mg Oral Q supper  . collagenase   Topical Daily  . darbepoetin (ARANESP) injection - DIALYSIS  200 mcg Intravenous Q Tue-HD  . feeding supplement (NEPRO CARB STEADY)  237 mL Oral TID BM  . feeding supplement (PRO-STAT SUGAR FREE 64)  30 mL Oral TID  . heparin injection (subcutaneous)  5,000 Units Subcutaneous Q8H  . multivitamin  1 tablet Oral QHS  . nutrition supplement (JUVEN)  1 packet Oral BID BM  . sevelamer carbonate  2.4 g Oral TID WC

## 2019-05-31 NOTE — Consult Note (Addendum)
[place] Nurse follow-up consult Note: Reason for Consult: Consult requested for sacrum wound.  Pt has been followed by the [place] team, physical therapy for hydrotherapy, and the surgical team.  Reviewed progress notes and photos in the EMR.  Wound has greatly improved after hydrotherapy.  Vac has been suggested, but I do not think this is a feasible option since pt is frequently incontinent of stool and the location is located close to the rectum and it would be difficult to maintain a seal.  Refer to surgical notes; "continue hydrotherapy and BID wet to dry dressing changes while inpatient if no Vac is placed.- okay for vac from our standpoint if [place] feels appropriate, difficult location to place a Vac."  Stage 4 pressure injury to sacrum, beefy red  with exposed bone and large amt yellow drainage, refer to PT notes for percentages.  Agree with surgical plan of care as listed above.  Pt will need an air mattress to reduce pressure when transferred to a SNF. [place] team will continue to assess the location weekly while in the hospital to determine if a change in the plan of care is necessary at that time.  Julien Girt MSN, RN, [place], [place], [place]

## 2019-05-31 NOTE — Plan of Care (Signed)
  Problem: Respiratory: Goal: Will maintain a patent airway Outcome: Progressing Goal: Complications related to the disease process, condition or treatment will be avoided or minimized Outcome: Progressing   Problem: Coping: Goal: Psychosocial and spiritual needs will be supported Outcome: Progressing   Problem: Education: Goal: Knowledge of risk factors and measures for prevention of condition will improve Outcome: Progressing   Problem: Skin Integrity: Goal: Risk for impaired skin integrity will decrease Outcome: Progressing   Problem: Safety: Goal: Ability to remain free from injury will improve Outcome: Progressing   Problem: Pain Managment: Goal: General experience of comfort will improve Outcome: Progressing   Problem: Elimination: Goal: Will not experience complications related to urinary retention Outcome: Progressing   Problem: Coping: Goal: Level of anxiety will decrease Outcome: Progressing   Problem: Nutrition: Goal: Adequate nutrition will be maintained Outcome: Progressing   Problem: Activity: Goal: Risk for activity intolerance will decrease Outcome: Progressing   Problem: Clinical Measurements: Goal: Ability to maintain clinical measurements within normal limits will improve Outcome: Progressing Goal: Will remain free from infection Outcome: Progressing Goal: Diagnostic test results will improve Outcome: Progressing Goal: Respiratory complications will improve Outcome: Progressing Goal: Cardiovascular complication will be avoided Outcome: Progressing   Problem: Health Behavior/Discharge Planning: Goal: Ability to manage health-related needs will improve Outcome: Progressing   Problem: Education: Goal: Knowledge of General Education information will improve Description: Including pain rating scale, medication(s)/side effects and non-pharmacologic comfort measures Outcome: Progressing

## 2019-06-01 DIAGNOSIS — E46 Unspecified protein-calorie malnutrition: Secondary | ICD-10-CM

## 2019-06-01 DIAGNOSIS — E440 Moderate protein-calorie malnutrition: Secondary | ICD-10-CM

## 2019-06-01 HISTORY — DX: Unspecified protein-calorie malnutrition: E46

## 2019-06-01 LAB — CBC
HCT: 28.9 % — ABNORMAL LOW (ref 39.0–52.0)
Hemoglobin: 9.4 g/dL — ABNORMAL LOW (ref 13.0–17.0)
MCH: 27.6 pg (ref 26.0–34.0)
MCHC: 32.5 g/dL (ref 30.0–36.0)
MCV: 84.8 fL (ref 80.0–100.0)
Platelets: 466 10*3/uL — ABNORMAL HIGH (ref 150–400)
RBC: 3.41 MIL/uL — ABNORMAL LOW (ref 4.22–5.81)
RDW: 18.6 % — ABNORMAL HIGH (ref 11.5–15.5)
WBC: 10.5 10*3/uL (ref 4.0–10.5)
nRBC: 0.2 % (ref 0.0–0.2)

## 2019-06-01 LAB — GLUCOSE, CAPILLARY: Glucose-Capillary: 86 mg/dL (ref 70–99)

## 2019-06-01 LAB — RENAL FUNCTION PANEL
Albumin: 2.5 g/dL — ABNORMAL LOW (ref 3.5–5.0)
Albumin: 2.5 g/dL — ABNORMAL LOW (ref 3.5–5.0)
Anion gap: 17 — ABNORMAL HIGH (ref 5–15)
Anion gap: 16 — ABNORMAL HIGH (ref 5–15)
BUN: 78 mg/dL — ABNORMAL HIGH (ref 6–20)
BUN: 77 mg/dL — ABNORMAL HIGH (ref 6–20)
CO2: 23 mmol/L (ref 22–32)
CO2: 24 mmol/L (ref 22–32)
Calcium: 9.5 mg/dL (ref 8.9–10.3)
Calcium: 9.8 mg/dL (ref 8.9–10.3)
Chloride: 90 mmol/L — ABNORMAL LOW (ref 98–111)
Chloride: 93 mmol/L — ABNORMAL LOW (ref 98–111)
Creatinine, Ser: 10.29 mg/dL — ABNORMAL HIGH (ref 0.61–1.24)
Creatinine, Ser: 10.56 mg/dL — ABNORMAL HIGH (ref 0.61–1.24)
GFR calc Af Amer: 6 mL/min — ABNORMAL LOW (ref >60)
GFR calc Af Amer: 6 mL/min — ABNORMAL LOW (ref >60)
GFR calc non Af Amer: 5 mL/min — ABNORMAL LOW (ref >60)
GFR calc non Af Amer: 5 mL/min — ABNORMAL LOW (ref >60)
Glucose, Bld: 100 mg/dL — ABNORMAL HIGH (ref 70–99)
Glucose, Bld: 97 mg/dL (ref 70–99)
Phosphorus: 6.5 mg/dL — ABNORMAL HIGH (ref 2.5–4.6)
Phosphorus: 6.6 mg/dL — ABNORMAL HIGH (ref 2.5–4.6)
Potassium: 6.5 mmol/L (ref 3.5–5.1)
Potassium: 4.8 mmol/L (ref 3.5–5.1)
Sodium: 130 mmol/L — ABNORMAL LOW (ref 135–145)
Sodium: 133 mmol/L — ABNORMAL LOW (ref 135–145)

## 2019-06-01 NOTE — Progress Notes (Addendum)
CRITICAL VALUE STICKER  CRITICAL VALUE: Potassium 6.5  RECEIVER (on-site recipient of call): Troy Wells   DATE & TIME NOTIFIED: 06/01/2019 0409  MESSENGER (representative from lab): Sam  MD NOTIFIED: Autry-Lott   TIME OF NOTIFICATION: 4008  RESPONSE:  Repeat Renal Panel and wait for Dialysis in AM

## 2019-06-01 NOTE — Progress Notes (Signed)
Nutrition Follow-up  DOCUMENTATION CODES:   Obesity unspecified  INTERVENTION:   If poor PO intake continues, recommend considering Cortrak NG tube placement and initiation of enteral nutrition. Pt with increased kcal and protein needs related to recent COVID-19 as well as pressure injury and ESRD on HD.  In order for pt to meet estimated needs, will need to eat meals plus protein supplements.  -Nepro Shake po TID, each supplement provides 425 kcal and 19 grams protein - Prostat liquid protein PO 30 ml TID with meals, each supplement provides 100 kcal, 15 grams protein. -Juven Fruit Punch BID, each serving provides 95kcal and 2.5g of protein (amino acids glutamine and arginine) -Rena-vit daily  NUTRITION DIAGNOSIS:   Increased nutrient needs related to acute illness(COVID-19) as evidenced by estimated needs.  Increased needs now d/t sacral ulcer and HD.  GOAL:   Patient will meet greater than or equal to 90% of their needs  Progressing   MONITOR:   PO intake, Supplement acceptance, Diet advancement, Labs, Weight trends, Skin, I & O's  ASSESSMENT:   53 yo male admitted in PEA arrest after a syncopal episode at HD center. Intubated on admission. PMH includes recent COVID PNA (positive on 10/28), HTN, CAD, ESRD on HD, HLD, OSA, DM-2.  11/18-11/24: CRRT 11/23 - extubated 11/25 - diet advanced to dysphagia 3 with thin liquids  **RD working remotely**  Patient had HD today. Last documented PO was on 12/13, 50% of breakfast. Pt continues to be in pain which is limiting his PO intake.  Patient has been drinking supplements to aid in wound healing.  Admission weight: 261 lbs. Current weight: 205 lbs.   I/Os: -6.5L since 12/1 HD today: 1.5L UF  Medications: Rena-vit tablet, Renvela pack Labs reviewed: Low Na Phos 6.6  Diet Order:   Diet Order            DIET DYS 3 Room service appropriate? Yes with Assist; Fluid consistency: Thin  Diet effective now               EDUCATION NEEDS:   Not appropriate for education at this time  Skin:  Skin Assessment: Skin Integrity Issues: Skin Integrity Issues:: Stage IV Stage II: sacrum Stage IV: sacrum  Last BM:  12/10  Height:   Ht Readings from Last 1 Encounters:  05/01/19 5\' 10"  (1.778 m)    Weight:   Wt Readings from Last 1 Encounters:  06/01/19 93 kg    Ideal Body Weight:  75.5 kg  BMI:  Body mass index is 29.41 kg/m.  Estimated Nutritional Needs:   Kcal:  4287-6811  Protein:  120-140 gm  Fluid:  1 L + UOP  Clayton Bibles, MS, RD, LDN Inpatient Clinical Dietitian Pager: [phone] After Hours Pager: [phone]

## 2019-06-01 NOTE — Progress Notes (Signed)
Family Medicine Teaching Service Daily Progress Note Intern Pager: [phone]  Patient name: Troy Wells Medical record number: [id number] Date of birth: 04-16-1966 Age: 53 y.o. Gender: male  Primary Care Provider: [name], MD Consultants: Neurology, critical care, nephrology Code Status: Full   Pt Overview and Major Events to Date:  04/27/2019 admitted to ICU 05/02/2019 MRI brain showing anoxic brain injury 05/10/2019: Extubated 05/12/2019: Started receiving care under FPTS 05/20/2019: EEG w/o seizure, Neuro signed off 05/21/2019: Stable for discharge to SNF  Assessment and Plan: Troy Wells is a 53 y.o. male transferred from ICU after cardiac arrest with recent history of COVID-19 infection.  PMH significant for ESRD MWF on HD, OSA, HTN, HLD, HFpEF, pHTN, type 2 diabetes, and diabetic neuropathy.  Encephalopathy  Possible anoxic brain injury PEA cardiac arrest Pt was at dialysis when I saw him this morning, lying in the bed. ANO X 1. Able to tell me who the president was. Dispo pending ability to maintain upright position during HD. -PT/OT consulted, appreciate recommendations -Monitor for improvement - SNF dispo  LUE/sacral ulcer Occurred while bed bound in ICU.  PT performing hydrotherapy.  Complicates patient's ability to sit in chair for HD.  Will need to demonstrate this ability prior to discharge to SNF.  Surgery was consulted for possible wound vac, saw patient on 12/13, recommended continued hydrotherapy and dressing changes BID.  Has prn oxy for hydrotherapy pain tolerance. -Up to chair and sit as tolerated -Continue wound care daily  -tylenol PRN -oxy PRN for hydrotherapy  -Continue turning patient  Q2h more on the right side due to left shoulder wond -gen surgery consulted, awaiting recommendations  COVIDEnterobacterHAP  Leukocytosis (Resolved) Sats 100% on RA, chest clear anteriorly on exam, no crackles or wheeze.  Patient now greater than  21 days out from Covid diagnosis. Has been cleared by infectious disease. Repeat Covid test on 05/24/2019 is negative.  -may need repeat COVID prior to dc to SNF   ESRD on HDHypercalcemia (Resolved) Secondary hyperparathyroidism Dialysis per nephro TTS schedule, next HD today.  K 6.5 early this morning today, repeat K 4.8, Na 133, BUN 77, Creat 10.56 -Nephrology following, appreciate recs -Daily renal function panel -Aranesp every Tuesday with HD -Senispar 30mg  daily, renvela 2.4g TID  HFmEF CAD [facility] 04/27/19 40 to 45%.  LV with mild to moderate decreased fxn,mildly increased LVH,inferiolateral akinesis and anterolateral severe hypokinesis. Grade 2 diastolic dysfunction.  -Continue aspirin 81 -Continue Lipitor 80 mg daily -Coreg 12.5 mg twice daily  Hypertension  Tachycardia, stable  BP 133/78, HR 77 -Continue Coreg 12.5 mg twice daily   OSA with use of CPAP Sats 100% on RA. OSA at baseline and uses CPAP intermittently. Now 21+ days out from Covid diagnosis, can use CPAP if needed. Refused CPAP ON.  -Continue to encourage patient to use CPAP at bedtime  Diabetes CBG 86 today.  Home medications include glipizide 5 mg BID.Last A1c 5.8 on 04/14/2019.  -Continue to hold home glipizide -would consider discontinuing glipizide on dc to avoid hypoglycemia  HLD Home medications include atorvastatin 40mg  -Atorvastatin 80mg  daily  Diabetic Neuropathy  Patient on home gabapentin 300 mg TID. Patient reports only taking BID.  -continue to hold homegabapentin  Gout  Home medications: Allopurinol 100mg  daily and colchicine 0.6mg  BID as needed when symptomatic. -Continue home allopurinol  Social/Disposition Bed was available at [facility] 12/9, may not be available now, patient still needs improvement before discharge -Discharge to SNF when stable  FEN/GI: Dysphagia 3 diet [id number]  5000 units subq 3 times daily  Disposition: pending ability to  tolerate sitting at HD  Subjective:  Pt has no concerns this morning, doing well.   Objective: Temp:  [98.4 F (36.9 C)-98.6 F (37 C)] 98.4 F (36.9 C) (12/14 2013) Pulse Rate:  [68-75] 75 (12/15 0416) Resp:  [16] 16 (12/14 2013) BP: (121-132)/(61-82) 132/82 (12/15 0416) SpO2:  [98 %-100 %] 100 % (12/15 0416) Weight:  [93 kg] 93 kg (12/15 0500)  General: Alert, cooperative and appears to be in no acute distress Cardio: Normal S1 and S2, RRR. No murmurs or rubs.   Pulm: CTAB anteriorly, no crackles, wheezing, or diminished breath sounds. Normal respiratory effort Abdomen: Bowel sounds normal. Abdomen soft and non-tender.  Extremities: No peripheral edema. Warm/ well perfused.   Laboratory: Recent Labs  Lab 05/25/19 1253 05/27/19 0236 06/01/19 0244  WBC 10.6* 10.0 10.5  HGB 8.1* 8.8* 9.4*  HCT 24.9* 27.9* 28.9*  PLT 457* 488* 466*   Recent Labs  Lab 05/30/19 0414 05/31/19 0330 06/01/19 0244  NA 135 135 130*  K 3.8 4.5 6.5*  CL 94* 93* 90*  CO2 26 26 23   BUN 32* 52* 78*  CREATININE 5.82* 7.89* 10.29*  CALCIUM 9.5 10.4* 9.5  GLUCOSE 117* 102* 100*    Imaging/Diagnostic Tests: No new images   Lattie Haw, MD 06/01/2019, 6:44 AM PGY-2, [place] Intern pager: [phone], text pages welcome

## 2019-06-01 NOTE — Plan of Care (Signed)
  Problem: Education: Goal: Knowledge of General Education information will improve Description: Including pain rating scale, medication(s)/side effects and non-pharmacologic comfort measures Outcome: Progressing   Problem: Health Behavior/Discharge Planning: Goal: Ability to manage health-related needs will improve Outcome: Progressing   Problem: Clinical Measurements: Goal: Ability to maintain clinical measurements within normal limits will improve Outcome: Progressing Goal: Will remain free from infection Outcome: Progressing Goal: Diagnostic test results will improve Outcome: Progressing Goal: Respiratory complications will improve Outcome: Progressing Goal: Cardiovascular complication will be avoided Outcome: Progressing   Problem: Activity: Goal: Risk for activity intolerance will decrease Outcome: Progressing   Problem: Nutrition: Goal: Adequate nutrition will be maintained Outcome: Progressing   Problem: Coping: Goal: Level of anxiety will decrease Outcome: Progressing   Problem: Elimination: Goal: Will not experience complications related to urinary retention Outcome: Progressing   Problem: Pain Managment: Goal: General experience of comfort will improve Outcome: Progressing   Problem: Safety: Goal: Ability to remain free from injury will improve Outcome: Progressing   Problem: Skin Integrity: Goal: Risk for impaired skin integrity will decrease Outcome: Progressing   Problem: Education: Goal: Knowledge of risk factors and measures for prevention of condition will improve Outcome: Progressing   Problem: Coping: Goal: Psychosocial and spiritual needs will be supported Outcome: Progressing   Problem: Respiratory: Goal: Will maintain a patent airway Outcome: Progressing Goal: Complications related to the disease process, condition or treatment will be avoided or minimized Outcome: Progressing

## 2019-06-01 NOTE — Progress Notes (Signed)
PT Cancellation Note  Patient Details Name: Troy Wells MRN: [id number] DOB: 1965-08-05   Cancelled Treatment:    Reason Eval/Treat Not Completed: Medical issues which prohibited therapy patient with K+ 6.5 today which places him at increased risk for cardiac arrhythmia with activity. Discussed case with MD, who recommends holding for now. Will continue to follow acutely.    Windell Norfolk, DPT, PN1   Supplemental Physical Therapist [facility]    Pager [phone] Acute Rehab Office [phone]

## 2019-06-01 NOTE — Progress Notes (Signed)
[facility] Progress Note   Subjective:   Seen and examined on dialysis; procedure supervised.  Blood pressure 133/66 and HR 89. BF 400.  Tolerating goal.  Labs this morning were hemolyzed with K 6.5.  Repeat was sent with plans for 0k bath if K above 6.2.  Zero K terminated after 9 min due to finding of mod hemolysis.   Review of systems:  Denies shortness of breath or chest pain  Denies n/v States is staying in bed - may be limited with some AMS   Objective Vitals:   06/01/19 0500 06/01/19 0728 06/01/19 0734 06/01/19 0800  BP:  133/78 130/80 129/70  Pulse:  83 71 77  Resp:  16 16   Temp:  98.2 F (36.8 C)    TempSrc:  Oral    SpO2:  98%    Weight: 93 kg     Height:       Physical Exam   General:Chronically ill appearing male in NAD Heart:S1,S2 RRR Lungs:CTAB unlabored Abdomen:soft/NT ND Extremities:No LE edema.  Dialysis Access:L AVF in place  Psych - calm mood and affect    Summary: Pt is a7 y.o.yo malewith ESRD who was admitted on11/10/2020with resp failure due to COVID/pulm edema and cardiac arrest   Dialysis: [facility] 4.5h F250 124.5kg 400/800 2/2.0Ca (low Ca here for ^Ca) Hep 12000 AVF - mircera 30 q4 last 10/13 - hect 4  Assess/ Plan: 1.HxCOVIDHCAP: S/p abx - off airborne precautions.  2. Vol overload: resolved; weight significantly down (reported as 22 kg down on 12/13) and on room air  3. ESRD:  Outpatient is TTS at [facility].  S/PCRRT 11/18- 11/24.  On HD TTS  4. Debility - pt has significant bilat LE/ general weakness as well as sig cognitive dysfunction as a consequence of prolonged ICU stay/ cardiac arrest. Will need to demonstrate HD in a chair here before can be dc'd to SNF or home.  Concern regarding memory/initiative.  If not able to get stronger he will not do well on long-term dialysis.   5. AMS: Felt to be ABI. As above.   6.Anemia(ESRD + ABLA): S/p1U PRBC11/16 and 11/19. Continue Aranesp 250mcg  weekly.  7.Secondary HPTH/hypercalcemia: Most likely d/t immobilization. VDRAheld- given calcitonin 11/25-11/26.  Low Ca (2Ca) bath. Renvela pwdr 2.4g TIW.PTH 459; he was started on sensipar 30 mg PO daily on 12/6.  Hypercalcemia improving   8. ?Seizure:prev took [place] - held this admit. Seen by neuro. EEG W/O seizure activity. Neuro signed off.   9. Sacral Decub- s/p surgery eval; per primary team   10. Dispo - see above.  Needs to be able to dialyze in a chair for outpatient HD.  Note sacral decub.  Needs PT and to be out of bed to chair in order to attempt HD in a chair.  Meds  . acetaminophen  650 mg Oral Q4H  . allopurinol  100 mg Oral Daily  . aspirin  81 mg Oral Daily  . atorvastatin  80 mg Oral Daily  . carvedilol  12.5 mg Oral BID WC  . Chlorhexidine Gluconate Cloth  6 each Topical Q0600  . Chlorhexidine Gluconate Cloth  6 each Topical Q0600  . cinacalcet  30 mg Oral Q supper  . collagenase   Topical Daily  . darbepoetin (ARANESP) injection - DIALYSIS  200 mcg Intravenous Q Tue-HD  . feeding supplement (NEPRO CARB STEADY)  237 mL Oral TID BM  . feeding supplement (PRO-STAT SUGAR FREE 64)  30 mL Oral TID  .  heparin injection (subcutaneous)  5,000 Units Subcutaneous Q8H  . multivitamin  1 tablet Oral QHS  . nutrition supplement (JUVEN)  1 packet Oral BID BM  . sevelamer carbonate  2.4 g Oral TID WC      Claudia Desanctis 8:46 AM 06/01/2019

## 2019-06-02 LAB — RENAL FUNCTION PANEL
Albumin: 2.7 g/dL — ABNORMAL LOW (ref 3.5–5.0)
Anion gap: 14 (ref 5–15)
BUN: 38 mg/dL — ABNORMAL HIGH (ref 6–20)
CO2: 26 mmol/L (ref 22–32)
Calcium: 9.8 mg/dL (ref 8.9–10.3)
Chloride: 94 mmol/L — ABNORMAL LOW (ref 98–111)
Creatinine, Ser: 6.98 mg/dL — ABNORMAL HIGH (ref 0.61–1.24)
GFR calc Af Amer: 9 mL/min — ABNORMAL LOW (ref >60)
GFR calc non Af Amer: 8 mL/min — ABNORMAL LOW (ref >60)
Glucose, Bld: 108 mg/dL — ABNORMAL HIGH (ref 70–99)
Phosphorus: 5.5 mg/dL — ABNORMAL HIGH (ref 2.5–4.6)
Potassium: 4.2 mmol/L (ref 3.5–5.1)
Sodium: 134 mmol/L — ABNORMAL LOW (ref 135–145)

## 2019-06-02 LAB — PREALBUMIN: Prealbumin: 36.7 mg/dL (ref 18–38)

## 2019-06-02 MED ORDER — CARVEDILOL 6.25 MG PO TABS
6.2500 mg | ORAL_TABLET | Freq: Two times a day (BID) | ORAL | Status: DC
  Administered 2019-06-03 – 2019-06-16 (×21): 6.25 mg via ORAL
  Filled 2019-06-02 (×22): qty 1

## 2019-06-02 MED ORDER — MINERAL OIL RE ENEM
1.0000 | ENEMA | Freq: Once | RECTAL | Status: AC
  Administered 2019-06-02: 1 via RECTAL
  Filled 2019-06-02: qty 1

## 2019-06-02 MED ORDER — BISACODYL 10 MG RE SUPP
10.0000 mg | Freq: Once | RECTAL | Status: AC
  Administered 2019-06-02: 10 mg via RECTAL
  Filled 2019-06-02: qty 1

## 2019-06-02 MED ORDER — DARBEPOETIN ALFA 200 MCG/0.4ML IJ SOSY
200.0000 ug | PREFILLED_SYRINGE | Freq: Once | INTRAMUSCULAR | Status: DC
  Filled 2019-06-02: qty 0.4

## 2019-06-02 MED ORDER — POLYETHYLENE GLYCOL 3350 17 G PO PACK
17.0000 g | PACK | Freq: Two times a day (BID) | ORAL | Status: DC
  Administered 2019-06-02 – 2019-06-16 (×26): 17 g via ORAL
  Filled 2019-06-02 (×26): qty 1

## 2019-06-02 MED ORDER — CHLORHEXIDINE GLUCONATE CLOTH 2 % EX PADS
6.0000 | MEDICATED_PAD | Freq: Every day | CUTANEOUS | Status: DC
  Administered 2019-06-04 – 2019-06-09 (×5): 6 via TOPICAL

## 2019-06-02 NOTE — Progress Notes (Signed)
No documented stool noted since 05/20/2019.  Dulcolax suppository given as ordered.  Large amt of hard stool felt in rectum.  Abdomen tender to touch.

## 2019-06-02 NOTE — Progress Notes (Signed)
Physical Therapy Treatment Patient Details Name: Troy Wells MRN: [id number] DOB: [birth year] Today's Date: 06/02/2019    History of Present Illness 53 yo male presenting to [facility] with cardiac arrest and CPR initatied by EMS. Pt at dialysis, complained of SOB, and became unresponsive. Pt with prior hospital admission (10/27 - 11/3) for COVID associated pneumonia; active problems of acute hypoxemic respiratory failure NSTEMI during admission. Intubated 11/10-11/23. MRI showing bilateral medial thalami is suspicious for anoxic injury and chronic R PICA cerebellar infarct.  Transitioned off of CRRT 05/11/19, femoral line removed 05/13/19.PMH including ESRD on HD, chronic anemia, chronic systolic heart failure, CAD, HTN, OSA on CPAP, DM2, and HLD.    PT Comments    Patient received in bed, more alert today and interacting appropriately with PT/tech, although he does remain A&Ox2 and requires increased time for processing, also required max coaxing for participation in session today. Able to roll with levels of assist as below, however demonstrated severe shaking/jerking with all mobility today present with movement and resolving when at rest. Attempted sit to stand but unable with totalAx2 even in stedy. Returned to bed with maxAx2 and attempted more rolling for repositioning of bed pads, at that point jerking/shaking became so severe that patient asked therapist to stop. Positioned bed pads and patient as best able, and he was left in bed with all needs met, bed alarm active and RN aware of patient status. Session limited by fatigue, general malaise, and severity of shaking with mobility causing some safety concerns.     Follow Up Recommendations  SNF;Supervision/Assistance - 24 hour     Equipment Recommendations  Hospital bed;Other (comment)(hoyer lift)    Recommendations for Other Services       Precautions / Restrictions Precautions Precautions: Fall Precaution Comments: flat affect,  minimal memory, seizure like shaking with movement Restrictions Weight Bearing Restrictions: No    Mobility  Bed Mobility   Bed Mobility: Rolling;Sidelying to Sit;Sit to Sidelying Rolling: Max assist Sidelying to sit: Max assist;+2 for physical assistance   Sit to supine: Max assist;+2 for physical assistance   General bed mobility comments: continues to require max cues with simple, step by step instructions to complete tasks and even then follows cues inconsistently; severe shaking noted with all movement and goes away with rest  Transfers Overall transfer level: Needs assistance Equipment used: Ambulation equipment used Transfers: Sit to/from Stand Sit to Stand: Total assist;+2 physical assistance         General transfer comment: attempted, unable to stand with totalAx2, limited by nausea and shaking with movement  Ambulation/Gait             General Gait Details: unable   Stairs             Wheelchair Mobility    Modified Rankin (Stroke Patients Only)       Balance Overall balance assessment: Needs assistance Sitting-balance support: Bilateral upper extremity supported Sitting balance-Leahy Scale: Poor Sitting balance - Comments: dependent on UE support to maintain EOB sitting                                    Cognition Arousal/Alertness: Awake/alert Behavior During Therapy: Flat affect Overall Cognitive Status: Impaired/Different from baseline Area of Impairment: Attention;Memory;Following commands;Safety/judgement;Awareness;Problem solving                 Orientation Level: Disoriented to;Time;Situation Current Attention Level: Sustained Memory: Decreased short-term memory Following  Commands: Follows one step commands with increased time;Follows one step commands inconsistently Safety/Judgement: Decreased awareness of deficits;Decreased awareness of safety Awareness: Intellectual Problem Solving: Slow  processing;Decreased initiation;Difficulty sequencing;Requires verbal cues;Requires tactile cues General Comments: much more alert today and interacting appropriately with therapist/tech, remains confused and with difficulty with cue following      Exercises      General Comments General comments (skin integrity, edema, etc.): severe shaking and seizing motion with all mobility today, even rolling in bed- RN aware      Pertinent Vitals/Pain Pain Assessment: Faces Faces Pain Scale: Hurts even more Pain Location: low back and sacrum Pain Descriptors / Indicators: Grimacing;Guarding;Moaning Pain Intervention(s): Limited activity within patient's tolerance;Monitored during session    Home Living                      Prior Function            PT Goals (current goals can now be found in the care plan section) Acute Rehab PT Goals Patient Stated Goal: to get stronger PT Goal Formulation: With patient Time For Goal Achievement: 06/12/19 Potential to Achieve Goals: Fair Progress towards PT goals: Not progressing toward goals - comment(limited by pain and seizing/shaking with movement)    Frequency    Min 3X/week      PT Plan Current plan remains appropriate    Co-evaluation              AM-PAC PT "6 Clicks" Mobility   Outcome Measure  Help needed turning from your back to your side while in a flat bed without using bedrails?: A Lot Help needed moving from lying on your back to sitting on the side of a flat bed without using bedrails?: A Lot Help needed moving to and from a bed to a chair (including a wheelchair)?: Total Help needed standing up from a chair using your arms (e.g., wheelchair or bedside chair)?: Total Help needed to walk in hospital room?: Total Help needed climbing 3-5 steps with a railing? : Total 6 Click Score: 8    End of Session Equipment Utilized During Treatment: Gait belt Activity Tolerance: Patient limited by fatigue;Other  (comment)(limited by malaise and shaking) Patient left: in bed;with call bell/phone within reach;with bed alarm set Nurse Communication: Mobility status;Other (comment)(extreme shaking movements with all mobility) PT Visit Diagnosis: Other abnormalities of gait and mobility (R26.89);Muscle weakness (generalized) (M62.81);Difficulty in walking, not elsewhere classified (R26.2);Pain Pain - part of body: (sacrum, low back)     Time: 1325-1400 PT Time Calculation (min) (ACUTE ONLY): 35 min  Charges:  $Therapeutic Activity: 23-37 mins                     Windell Norfolk, DPT, PN1   Supplemental Physical Therapist [place]    Pager [phone] Acute Rehab Office [phone]

## 2019-06-02 NOTE — Progress Notes (Addendum)
Family Medicine Teaching Service Daily Progress Note Intern Pager: [phone]  Patient name: Troy Wells Medical record number: [id number] Date of birth: August 26, 1965 Age: 53 y.o. Gender: male  Primary Care Provider: [name], MD Consultants: Neurology, critical care, nephrology Code Status: Full   Pt Overview and Major Events to Date:  04/27/2019 admitted to ICU 05/02/2019 MRI brain showing anoxic brain injury 05/10/2019: Extubated 05/12/2019: Started receiving care under FPTS 05/20/2019: EEG w/o seizure, Neuro signed off 05/21/2019: Stable for discharge to SNF  Assessment and Plan: Krayton Wortley is a 53 y.o. male transferred from ICU after cardiac arrest with recent history of COVID-19 infection.  PMH significant for ESRD MWF on HD, OSA, HTN, HLD, HFpEF, pHTN, type 2 diabetes, and diabetic neuropathy.  Encephalopathy  Possible anoxic brain injury PEA cardiac arrest ANO x1 today Dispo pending ability to maintain upright position during HD. -PT/OT consulted, appreciate recommendations -Monitor for improvement - SNF dispo  LUE/sacral ulcer Will need to demonstrate this ability prior to discharge to SNF.  Surgery was consulted for possible wound vac, saw patient on 12/13, recommended continued hydrotherapy and dressing changes BID.  Has prn oxy for hydrotherapy pain tolerance. -Up to chair and sit as tolerated -Continue wound care daily   -tylenol PRN -oxy PRN for hydrotherapy  -Continue turning patient  Q2h more on the right side due to left shoulder wond  Protein calorie malnutrition  Pre-albumin 36.7 today indicating the need for nutritional support. Last documented % of food eaten is on 12/13 -Encourage PO intake -RN to document % of food eaten at each meal  -If persistent poor PO intake consider feeding tube   COVIDEnterobacterHAP  Leukocytosis (Resolved) Sats 96% on RA *88  Patient now greater than 21 days out from Covid diagnosis. Has been cleared  by infectious disease. Repeat Covid test on 05/24/2019 is negative.  -may need repeat COVID prior to dc to SNF   ESRD on HDHypercalcemia (Resolved) Secondary hyperparathyroidism Dialysis per nephro TTS schedule, next HD today.  Renal function panel: Cr 6.98, BUN 38, Na 134, K 4.2, Ca 9.8, Phosp 5.5 (Phosp 6.6 on 12/15)  -Nephrology following, appreciate recs -Daily renal function panel -Aranesp every Tuesday with HD -Senispar 30mg  daily, renvela 2.4g TID  Constipation-treating  No BM for 3 days, bowel sounds normal. Abdomen soft, generalized tenderness but more so in right flank, no guarding or rebound tenderness.  -Prescribed Miralax BID and Dulcolax   HFmEF CAD ECHOon 04/27/19 40 to 45%.  LV with mild to moderate decreased fxn,mildly increased LVH,inferiolateral akinesis and anterolateral severe hypokinesis. Grade 2 diastolic dysfunction.  -Continue aspirin 81 -Continue Lipitor 80 mg daily -Coreg 12.5 mg twice daily  Hypertension  Tachycardia, stable  BP 103/91, HR 97 -Continue Coreg 12.5 mg twice daily   OSA with use of CPAP Sats 100% on RA. OSA at baseline and uses CPAP intermittently. Now 21+ days out from Covid diagnosis, can use CPAP if needed. Refused CPAP ON.  -Continue to encourage patient to use CPAP at bedtime  Diabetes CBG 86 today.  Home medications include glipizide 5 mg BID.Last A1c 5.8 on 04/14/2019.  -Continue to hold home glipizide -would consider discontinuing glipizide on dc to avoid hypoglycemia  HLD Home medications include atorvastatin 40mg  -Atorvastatin 80mg  daily  Diabetic Neuropathy  Patient on home gabapentin 300 mg TID. Patient reports only taking BID.  -continue to hold homegabapentin  Gout  Home medications: Allopurinol 100mg  daily and colchicine 0.6mg  BID as needed when symptomatic. -Continue home allopurinol  Social/Disposition  Bed was available at [facility] 12/9, may not be available now, patient still needs  improvement before discharge -Discharge to SNF when stable  FEN/GI: Dysphagia 3 diet ZJQ:BHALPFX 5000 units subq 3 times daily  Disposition: pending ability to tolerate sitting at HD  Subjective:  No new concerns today except feels uncomfortable in the position he is in the bed due to his sacral ulcer. Last BM 3 days ago. Objective: Temp:  [98.3 F (36.8 C)-98.7 F (37.1 C)] 98.3 F (36.8 C) (12/16 0746) Pulse Rate:  [72-98] 97 (12/16 0746) Resp:  [16-20] 20 (12/15 1631) BP: (103-134)/(54-116) 103/91 (12/16 0746) SpO2:  [96 %-100 %] 100 % (12/16 0746) Weight:  [93 kg] 93 kg (12/16 0500)  General: Alert, cooperative, pleasant and in no acute distress Cardio: Normal S1 and S2, RRR, No murmurs or rubs.   Pulm: CTAB, no crackles, wheezing, or diminished breath sounds. Normal respiratory effort Abdomen: Bowel sounds normal. Abdomen soft, generalized tenderness but more so in right flank, no guarding, no rebound tenderness.  Extremities: No peripheral edema. Warm/ well perfused.  Strong radial pulse Neuro: Cranial nerves grossly intact   Laboratory: Recent Labs  Lab 05/27/19 0236 06/01/19 0244  WBC 10.0 10.5  HGB 8.8* 9.4*  HCT 27.9* 28.9*  PLT 488* 466*   Recent Labs  Lab 06/01/19 0244 06/01/19 0835 06/02/19 0211  NA 130* 133* 134*  K 6.5* 4.8 4.2  CL 90* 93* 94*  CO2 23 24 26   BUN 78* 77* 38*  CREATININE 10.29* 10.56* 6.98*  CALCIUM 9.5 9.8 9.8  GLUCOSE 100* 97 108*    Imaging/Diagnostic Tests: No new images   Lattie Haw, MD 06/02/2019, 9:21 AM PGY-1, [place] Intern pager: [phone], text pages welcome

## 2019-06-02 NOTE — Progress Notes (Addendum)
[facility] Progress Note   Subjective:  s/p HD on 12/15 with 1.5 kg UF.  Staff on the floor states hasn't tolerated sitting in a chair for long stretches of time.  Per his nurse he hasn't gotten up to chair much at all.   Review of systems:   Denies shortness of breath or chest pain  Denies n/v Trying to eat some breakfast  Note limited by memory deficit    Objective Vitals:   06/01/19 1122 06/01/19 1631 06/02/19 0500 06/02/19 0746  BP: 134/81 (!) 129/116  (!) 103/91  Pulse: 90 90  97  Resp: 16 20    Temp: 98.6 F (37 C) 98.7 F (37.1 C)  98.3 F (36.8 C)  TempSrc: Oral Oral  Oral  SpO2: 96% 96%  100%  Weight:   93 kg   Height:       Physical Exam  General:Chronically ill appearing male in NAD Heart:S1,S2 RRR Lungs:CTAB unlabored Abdomen:soft/NT ND Extremities:No LE edema.  Dialysis Access:L AVF bruit and thrill  Psych - calm mood and affect    Summary: Pt is a57 y.o.yo malewith ESRD who was admitted on11/10/2020with resp failure due to COVID/pulm edema and cardiac arrest   Dialysis: TTSEast 4.5h F250 124.5kg 400/800 2/2.0Ca (low Ca here for ^Ca) Hep 12000 AVF - mircera 30 q4 last 10/13 - hect 4  Assess/ Plan: 1.HxCOVIDHCAP: S/p treatment- off airborne precautions.  2. Vol overload: resolved; weight significantly down (reported as 22 kg down on 12/13) and on room air  3. ESRD:  Outpatient is TTS at [facility].  S/PCRRT 11/18- 11/24.  On HD TTS  4. Debility - pt has significant bilat LE/ general weakness as well as sig cognitive dysfunction as a consequence of prolonged ICU stay/ cardiac arrest. Will need to demonstrate HD in a chair here before can be dc'd to SNF or home.  Concern regarding memory/initiative.  If not able to get stronger he will not do well on long-term dialysis.   5. AMS: Felt to be ABI. As above.   6.Anemia(ESRD + ABLA): S/p1U PRBC11/16 and 11/19. Continue Aranesp 275mcg weekly.  7.Secondary  HPTH/hypercalcemia: Most likely d/t immobilization. VDRAheld- given calcitonin 11/25-11/26.  Low Ca (2Ca) bath given hypercalcemia. Renvela pwdr 2.4g TIW.PTH 459; he was started on sensipar 30 mg PO daily on 12/6.    8. ?Seizure:prev took Keppra - held this admit. Seen by neuro. EEG W/O seizure activity. Neuro signed off.   9. Sacral Decub- s/p surgery eval; per primary team   10. Dispo - see above.  Needs to be able to dialyze in a chair for outpatient HD.  Would mobilize to chair so that we are able to attempt HD in a chair   Meds  . acetaminophen  650 mg Oral Q4H  . allopurinol  100 mg Oral Daily  . aspirin  81 mg Oral Daily  . atorvastatin  80 mg Oral Daily  . carvedilol  12.5 mg Oral BID WC  . Chlorhexidine Gluconate Cloth  6 each Topical Q0600  . Chlorhexidine Gluconate Cloth  6 each Topical Q0600  . cinacalcet  30 mg Oral Q supper  . collagenase   Topical Daily  . darbepoetin (ARANESP) injection - DIALYSIS  200 mcg Intravenous Q Tue-HD  . feeding supplement (NEPRO CARB STEADY)  237 mL Oral TID BM  . feeding supplement (PRO-STAT SUGAR FREE 64)  30 mL Oral TID  . heparin injection (subcutaneous)  5,000 Units Subcutaneous Q8H  . multivitamin  1  tablet Oral QHS  . nutrition supplement (JUVEN)  1 packet Oral BID BM  . sevelamer carbonate  2.4 g Oral TID WC      Claudia Desanctis 8:45 AM 06/02/2019

## 2019-06-02 NOTE — Progress Notes (Signed)
Nutritional intake very poor.  Discussed potential for enteral feedings with patient, which he is strongly against.  He states he wants to eat, but just isn't hungry.  Encouraging hi protein intake without much success.  Encouraging patient to make own choices from menu.  Continues with clonic movements of extremities with any movements, which dissipate with rest.

## 2019-06-02 NOTE — Progress Notes (Signed)
Pt refusing cpap for the night. RT will continue to monitor as needed. 

## 2019-06-02 NOTE — Progress Notes (Signed)
Daughter Troy Wells at bedside with pt. Troy Wells was able to encourage pt to eat 50% of his dinner and drink an apple juice and a half.

## 2019-06-03 DIAGNOSIS — L98429 Non-pressure chronic ulcer of back with unspecified severity: Secondary | ICD-10-CM

## 2019-06-03 LAB — RENAL FUNCTION PANEL
Albumin: 2.6 g/dL — ABNORMAL LOW (ref 3.5–5.0)
Anion gap: 18 — ABNORMAL HIGH (ref 5–15)
BUN: 56 mg/dL — ABNORMAL HIGH (ref 6–20)
CO2: 23 mmol/L (ref 22–32)
Calcium: 10.3 mg/dL (ref 8.9–10.3)
Chloride: 93 mmol/L — ABNORMAL LOW (ref 98–111)
Creatinine, Ser: 9.44 mg/dL — ABNORMAL HIGH (ref 0.61–1.24)
GFR calc Af Amer: 7 mL/min — ABNORMAL LOW (ref >60)
GFR calc non Af Amer: 6 mL/min — ABNORMAL LOW (ref >60)
Glucose, Bld: 113 mg/dL — ABNORMAL HIGH (ref 70–99)
Phosphorus: 7.4 mg/dL — ABNORMAL HIGH (ref 2.5–4.6)
Potassium: 4.7 mmol/L (ref 3.5–5.1)
Sodium: 134 mmol/L — ABNORMAL LOW (ref 135–145)

## 2019-06-03 LAB — CBC
HCT: 28.6 % — ABNORMAL LOW (ref 39.0–52.0)
Hemoglobin: 9.1 g/dL — ABNORMAL LOW (ref 13.0–17.0)
MCH: 27.5 pg (ref 26.0–34.0)
MCHC: 31.8 g/dL (ref 30.0–36.0)
MCV: 86.4 fL (ref 80.0–100.0)
Platelets: 438 10*3/uL — ABNORMAL HIGH (ref 150–400)
RBC: 3.31 MIL/uL — ABNORMAL LOW (ref 4.22–5.81)
RDW: 18.7 % — ABNORMAL HIGH (ref 11.5–15.5)
WBC: 12.5 10*3/uL — ABNORMAL HIGH (ref 4.0–10.5)
nRBC: 0 % (ref 0.0–0.2)

## 2019-06-03 MED ORDER — SENNA 8.6 MG PO TABS
1.0000 | ORAL_TABLET | Freq: Every day | ORAL | Status: DC
  Administered 2019-06-03 – 2019-06-16 (×12): 8.6 mg via ORAL
  Filled 2019-06-03 (×12): qty 1

## 2019-06-03 MED ORDER — DARBEPOETIN ALFA 200 MCG/0.4ML IJ SOSY
200.0000 ug | PREFILLED_SYRINGE | Freq: Once | INTRAMUSCULAR | Status: AC
  Administered 2019-06-03: 200 ug via SUBCUTANEOUS
  Filled 2019-06-03: qty 0.4

## 2019-06-03 MED ORDER — OXYCODONE HCL 5 MG PO TABS
7.5000 mg | ORAL_TABLET | Freq: Two times a day (BID) | ORAL | Status: DC | PRN
  Administered 2019-06-03 – 2019-06-10 (×4): 7.5 mg via ORAL
  Filled 2019-06-03 (×4): qty 2

## 2019-06-03 NOTE — Progress Notes (Signed)
FPTS Interim Progress Note  Call patient's daughter Troy Wells and spoke with her regarding changes made to her father's regimen today including getting oxycodone with dialysis and giving a as needed oxycodone to use during dialysis to see if he can tolerate sitting up.  She agrees with this and thinks is a good move.  She notes that he did have a bowel movement today that she was aware of which made her happy.  She thinks that his eating is improving, and that he ate a very large amount last night with her.  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  Morrison, DO 06/03/2019, 1:26 PM PGY-2, [place] Service pager [phone]

## 2019-06-03 NOTE — Progress Notes (Signed)
[facility] Progress Note   Subjective:  s/p HD on 12/15 with 1.5 kg UF.  His daughter is at bedside.  Spoke with nurse and he has been in chair for about an hour - very uncomfortable and she doesn't think that he could tolerate today's tx in a chair  Review of systems:    Denies shortness of breath or chest pain  Denies n/v Thinks needs to have a BM -just put on bedpan Note limited by memory deficit    Objective Vitals:   06/02/19 0746 06/02/19 1601 06/02/19 2325 06/03/19 0900  BP: (!) 103/91 (!) 96/58 121/81 (!) 151/97  Pulse: 97 91 78 94  Resp:   18 20  Temp: 98.3 F (36.8 C) 97.6 F (36.4 C) 98 F (36.7 C) 98.4 F (36.9 C)  TempSrc: Oral Oral Oral Oral  SpO2: 100% 99% 97% 100%  Weight:      Height:       Physical Exam    General:Chronically ill appearing male in NAD Heart:S1,S2 RRR Lungs:CTAB unlabored; room air Abdomen:soft/NT ND Extremities:No LE edema.  Dialysis Access:L AVF bruit and thrill  Psych - calm mood and affect    Summary: Pt is a78 y.o.yo malewith ESRD who was admitted on11/10/2020with resp failure due to COVID/pulm edema and cardiac arrest   Dialysis: TTSEast 4.5h F250 124.5kg 400/800 2/2.0Ca (low Ca here for ^Ca) Hep 12000 AVF - mircera 30 q4 last 10/13 - hect 4  Assess/ Plan: 1.HxCOVIDHCAP: S/p treatment- off airborne precautions.  2. Vol overload: resolved; weight significantly down (reported as 22 kg down on 12/13) and on room air  3. ESRD:  Outpatient is TTS at [facility].  S/PCRRT 11/18- 11/24.  On HD TTS, low calcium bath as below  4. Debility - pt has significant bilat LE/ general weakness as well as sig cognitive dysfunction as a consequence of prolonged ICU stay/ cardiac arrest. Will need to demonstrate HD in a chair here before can be dc'd to SNF or home.  Concern regarding memory/initiative.  If not able to get stronger he will not do well on long-term dialysis.   5. AMS: Felt to be ABI. As  above.   6.Anemia(ESRD + ABLA): S/p1U PRBC11/16 and 11/19. Continue Aranesp 223mcg weekly.  7.Secondary HPTH/hypercalcemia: Most likely d/t immobilization. VDRAheld- given calcitonin 11/25-11/26.  Low Ca (2Ca) bath given hypercalcemia. Renvela pwdr 2.4g TIW.PTH 459; he was started on sensipar 30 mg PO daily on 12/6.    8. ?Seizure:prev took Keppra - held this admit. Seen by neuro. EEG W/O seizure activity. Neuro signed off.   9. Sacral Decub- s/p surgery eval; per primary team   10. Dispo - see above.  Needs to be able to dialyze in a chair for outpatient HD - not tolerating sitting in a chair well now.  Would mobilize to chair so that we are able to attempt HD in a chair   Meds  . acetaminophen  650 mg Oral Q4H  . allopurinol  100 mg Oral Daily  . aspirin  81 mg Oral Daily  . atorvastatin  80 mg Oral Daily  . carvedilol  6.25 mg Oral BID WC  . Chlorhexidine Gluconate Cloth  6 each Topical Q0600  . cinacalcet  30 mg Oral Q supper  . collagenase   Topical Daily  . darbepoetin (ARANESP) injection - DIALYSIS  200 mcg Intravenous Q Tue-HD  . darbepoetin (ARANESP) injection - NON-DIALYSIS  200 mcg Subcutaneous Once  . feeding supplement (NEPRO CARB STEADY)  237 mL Oral TID BM  . feeding supplement (PRO-STAT SUGAR FREE 64)  30 mL Oral TID  . heparin injection (subcutaneous)  5,000 Units Subcutaneous Q8H  . multivitamin  1 tablet Oral QHS  . nutrition supplement (JUVEN)  1 packet Oral BID BM  . polyethylene glycol  17 g Oral BID  . senna  1 tablet Oral Daily  . sevelamer carbonate  2.4 g Oral TID WC      Claudia Desanctis 10:59 AM 06/03/2019

## 2019-06-03 NOTE — Progress Notes (Signed)
Patient refusing CPAP for the night. RT will monitor as needed

## 2019-06-03 NOTE — Progress Notes (Signed)
OT Cancellation Note  Patient Details Name: Troy Wells MRN: [id number] DOB: 01-17-1966   Cancelled Treatment:    Reason Eval/Treat Not Completed: Patient at procedure or test/ unavailable;Other (comment) Pt out of room at HD, will check back as time allows.  Lanier Clam., COTA/L Acute Rehabilitation Services [phone] [place] 06/03/2019, 1:55 PM

## 2019-06-03 NOTE — Progress Notes (Signed)
Family Medicine Teaching Service Daily Progress Note Intern Pager: [phone]  Patient name: Troy Wells Medical record number: [id number] Date of birth: 06/05/1966 Age: 53 y.o. Gender: male  Primary Care Provider: [name], MD Consultants: Neurology, critical care, nephrology Code Status: Full   Pt Overview and Major Events to Date:  04/27/2019 admitted to ICU 05/02/2019 MRI brain showing anoxic brain injury 05/10/2019: Extubated 05/12/2019: Started receiving care under FPTS 05/20/2019: EEG w/o seizure, Neuro signed off 05/21/2019: Stable for discharge to SNF  Assessment and Plan: Troy Wells is a 53 y.o. male transferred from ICU after cardiac arrest with recent history of COVID-19 infection.  PMH significant for ESRD MWF on HD, OSA, HTN, HLD, HFpEF, pHTN, type 2 diabetes, and diabetic neuropathy.  Encephalopathy  Possible anoxic brain injury PEA cardiac arrest ANO X 1. Able to have a small conversation. Dispo pending ability to maintain upright position during HD. -PT/OT consulted, appreciate recommendations -Monitor for improvement - SNF dispo -Contact daughter regarding palliative involvement and code status   LUE/sacral ulcer Will need to demonstrate ability to sit upright for dialysis for > 3hrs prior to discharge to SNF.  Surgery was consulted for possible wound vac, saw patient on 12/13, recommended continued hydrotherapy and dressing changes BID.  -Up to chair and sit as tolerated -Continue wound care daily   -Tylenol PRN -Continue turning patient Q2h more on the right side due to left shoulder wond  Protein calorie malnutrition  Pre-albumin 36.7 on 12/16 indicating the need for nutritional support. Ate 50% of meals yesterday when daughter visited him -Encourage PO intake -RN to document % of food eaten at each meal  -If persistent poor PO intake consider feeding tube   COVIDEnterobacterHAP  Leukocytosis (Resolved) Sats 96% on RA  Patient  now greater than 21 days out from Covid diagnosis. Has been cleared by infectious disease. Repeat Covid test on 05/24/2019 is negative.  -may need repeat COVID prior to dc to SNF   ESRD on HDHypercalcemia (Resolved) Secondary hyperparathyroidism Dialysis per nephro TTS schedule Labs today: Cr 9.44, BUN 56, Na 134, K 4.7, Ca 10.3, Phosp 7.4  -Renal function panel three times a week -CBC once weekly -Nephrology following, appreciate recs -Aranesp every Tuesday with HD -Senispar 30mg  daily, renvela 2.4g TID -Oxycodone prior to HD and during HD  Constipation-treating  Last BM not documented  -Prescribed Miralax BID and Dulcolax  -Added Senna today   HFmEF CAD [facility] 04/27/19 40 to 45%.  LV with mild to moderate decreased fxn,mildly increased LVH,inferiolateral akinesis and anterolateral severe hypokinesis. Grade 2 diastolic dysfunction.  -Continue aspirin 81 -Continue Lipitor 80 mg daily -Coreg 12.5 mg twice daily  Hypertension  Tachycardia, stable  BP 121/81, HR 78 -Continue Coreg 12.5 mg twice daily   OSA with use of CPAP Sats 97% on RA. OSA at baseline and uses CPAP intermittently. Now 21+ days out from Covid diagnosis, can use CPAP if needed. Refused CPAP ON.  -Continue to encourage patient to use CPAP at bedtime  Diabetes CBG 86 on 12/16, no documented CBGs for today Home medications include glipizide 5 mg BID.Last A1c 5.8 on 04/14/2019.  -Continue to hold home glipizide -would consider discontinuing glipizide on dc to avoid hypoglycemia  HLD Home medications include atorvastatin 40mg  -Atorvastatin 80mg  daily  Diabetic Neuropathy  Patient on home gabapentin 300 mg TID. Patient reports only taking BID.  -continue to hold homegabapentin  Gout  Home medications: Allopurinol 100mg  daily and colchicine 0.6mg  BID as needed when symptomatic. -Continue home  allopurinol  Social/Disposition Bed was available at [facility] 12/9, may not be available  now, patient still needs improvement before discharge -Discharge to SNF when stable  FEN/GI: Dysphagia 3 diet TIR:WERXVQM 5000 units subq 3 times daily  Disposition: pending ability to tolerate sitting at HD  Subjective:  Doing well. Able to have a small conversation with me this morning. Encouraged PO intake.  Objective: Temp:  [97.6 F (36.4 C)-98.3 F (36.8 C)] 98 F (36.7 C) (12/16 2325) Pulse Rate:  [78-97] 78 (12/16 2325) Resp:  [18] 18 (12/16 2325) BP: (96-121)/(58-91) 121/81 (12/16 2325) SpO2:  [97 %-100 %] 97 % (12/16 2325)  General: Alert, cooperative, lying on right side, comfortable Cardio: Normal S1 and S2, RRR, no murmurs or rubs.   Pulm: Clear to auscultation bilaterally  (anteriorly), no crackles, wheezing, or diminished breath sounds. Normal respiratory effort Abdomen: Bowel sounds normal. Abdomen soft and non-tender.  Extremities: No peripheral edema. Warm/ well perfused. Neuro: Cranial nerves grossly intact, ANO x 1  Laboratory: Recent Labs  Lab 06/01/19 0244  WBC 10.5  HGB 9.4*  HCT 28.9*  PLT 466*   Recent Labs  Lab 06/01/19 0835 06/02/19 0211 06/03/19 0334  NA 133* 134* 134*  K 4.8 4.2 4.7  CL 93* 94* 93*  CO2 24 26 23   BUN 77* 38* 56*  CREATININE 10.56* 6.98* 9.44*  CALCIUM 9.8 9.8 10.3  GLUCOSE 97 108* 113*    Imaging/Diagnostic Tests: No new images   Lattie Haw, MD 06/03/2019, 7:21 AM PGY-1, [place] Intern pager: [phone], text pages welcome

## 2019-06-04 NOTE — Progress Notes (Signed)
Family Medicine Teaching Service Daily Progress Note Intern Pager: [phone]  Patient name: Troy Wells Medical record number: [id number] Date of birth: [birth year] Age: 53 y.o. Gender: male  Primary Care Provider: [name], MD Consultants: Neurology, critical care, nephrology Code Status: Full   Pt Overview and Major Events to Date:  04/27/2019 admitted to ICU 05/02/2019 MRI brain showing anoxic brain injury 05/10/2019: Extubated 05/12/2019: Started receiving care under FPTS 05/20/2019: EEG w/o seizure, Neuro signed off 05/21/2019: Stable for discharge to SNF  Assessment and Plan: Cal Gindlesperger is a 54 y.o. male transferred from ICU after cardiac arrest with recent history of COVID-19 infection.  PMH significant for ESRD MWF on HD, OSA, HTN, HLD, HFpEF, pHTN, type 2 diabetes, and diabetic neuropathy.  Encephalopathy  Possible anoxic brain injury PEA cardiac arrest Dispo pending ability to maintain upright position during HD. -PT/OT consulted, appreciate recommendations -Monitor for improvement - SNF dispo -Contacted daughter on 12/17: would like her father to be full code and have aggressive measures for his treatment   LUE/sacral ulcer Will need to demonstrate ability to sit upright for dialysis for > 3hrs prior to discharge to SNF.  Surgery was consulted for possible wound vac, saw patient on 12/13, recommended continued hydrotherapy and dressing changes BID.  -Up to chair and sit as tolerated -Continue wound care daily   -Tylenol PRN -Continue turning patient Q2h more on the right side due to left shoulder wond  Protein calorie malnutrition  Pre-albumin 36.7 on 12/16 indicating the need for nutritional support.  Has been eating 5-50% of meals, eats more with his daughter at bedside  -Encourage PO intake -RN to document % of food eaten at each meal  -If persistent poor PO intake consider feeding tube   COVIDEnterobacterHAP  Leukocytosis  (Resolved) Sats 99% on RA  Patient now greater than 21 days out from Covid diagnosis. Has been cleared by infectious disease. Repeat Covid test on 05/24/2019 is negative.  -may need repeat COVID prior to dc to SNF   ESRD on HDHypercalcemia (Resolved) Secondary hyperparathyroidism Dialysis per nephro TTS schedule Labs on 12/17: Cr 9.44, BUN 56, Na 134, K 4.7, Ca 10.3, Phosp 7.4  -Renal function panel three times a week -CBC once weekly -Nephrology following, appreciate recs -Aranesp every Tuesday with HD -Senispar 30mg  daily, renvela 2.4g TID -Oxycodone prior to HD and during HD  Constipation-treating  Last BM not documented  -Prescribed Miralax BID and Dulcolax  -Added Senna today   HFmEF CAD [facility] 04/27/19 40 to 45%.  LV with mild to moderate decreased fxn,mildly increased LVH,inferiolateral akinesis and anterolateral severe hypokinesis. Grade 2 diastolic dysfunction.  -Continue aspirin 81 -Continue Lipitor 80 mg daily -Coreg 12.5 mg twice daily  Hypertension  Tachycardia, stable  BP 102V systolic, HR 89 -Continue Coreg 12.5 mg twice daily   OSA with use of CPAP OSA at baseline and uses CPAP intermittently. Now 21+ days out from Covid diagnosis, can use CPAP if needed. Refused CPAP ON.  -Continue to encourage patient to use CPAP at bedtime  Diabetes CBG 86 on 12/16, no documented CBGs for today Home medications include glipizide 5 mg BID.Last A1c 5.8 on 04/14/2019.  -Continue to hold home glipizide -would consider discontinuing glipizide on dc to avoid hypoglycemia  HLD Home medications include atorvastatin 40mg  -Atorvastatin 80mg  daily  Diabetic Neuropathy  Patient on home gabapentin 300 mg TID. Patient reports only taking BID.  -continue to hold homegabapentin  Gout  Home medications: Allopurinol 100mg  daily and colchicine 0.6mg  BID  as needed when symptomatic. -Continue home allopurinol  Social/Disposition Bed was available at [facility] 12/9, may not be available now, patient still needs improvement before discharge -Discharge to SNF when stable  FEN/GI: Dysphagia 3 diet EPP:IRJJOAC 5000 units subq 3 times daily  Disposition: pending ability to tolerate sitting at HD  Subjective:  Doing well and reports being comfortable. No concerns today.   Objective: Temp:  [98.4 F (36.9 C)-98.8 F (37.1 C)] 98.7 F (37.1 C) (12/17 2255) Pulse Rate:  [80-110] 89 (12/17 2255) Resp:  [16-20] 18 (12/17 2255) BP: (92-151)/(47-97) 103/72 (12/17 2255) SpO2:  [95 %-100 %] 99 % (12/17 2255) Weight:  [92.5 kg] 92.5 kg (12/18 0419)  General: Alert, cooperative and in no acute distress Cardio: Normal S1 and S2, RRR. No murmurs or rubs.   Pulm: CTAB, no crackles, wheezing, or diminished breath sounds. Normal respiratory effort.  Abdomen: Bowel sounds normal. Abdomen soft and non-tender.  Extremities: No peripheral edema. Warm/ well perfused. Strong radial pulse. Neuro: Cranial nerves grossly intact  Laboratory: Recent Labs  Lab 06/01/19 0244 06/03/19 1350  WBC 10.5 12.5*  HGB 9.4* 9.1*  HCT 28.9* 28.6*  PLT 466* 438*   Recent Labs  Lab 06/01/19 0835 06/02/19 0211 06/03/19 0334  NA 133* 134* 134*  K 4.8 4.2 4.7  CL 93* 94* 93*  CO2 24 26 23   BUN 77* 38* 56*  CREATININE 10.56* 6.98* 9.44*  CALCIUM 9.8 9.8 10.3  GLUCOSE 97 108* 113*    Imaging/Diagnostic Tests: No new images   Lattie Haw, MD 06/04/2019, 6:47 AM PGY-1, [place] Intern pager: [phone], text pages welcome

## 2019-06-04 NOTE — Progress Notes (Signed)
Patient ID: Troy Wells, male   DOB: 28-Aug-1965, 53 y.o.   MRN: [id number] S: No new complaints O:BP (!) 81/70 (BP Location: Left Wrist)   Pulse 74   Temp 98.5 F (36.9 C) (Oral)   Resp 18   Ht 5\' 10"  (1.778 m)   Wt 92.5 kg   SpO2 100%   BMI 29.27 kg/m   Intake/Output Summary (Last 24 hours) at 06/04/2019 1000 Last data filed at 06/03/2019 1659 Gross per 24 hour  Intake --  Output 1278 ml  Net -1278 ml   Intake/Output: I/O last 3 completed shifts: In: 600 [P.O.:600] Out: 1278 [Other:1278]  Intake/Output this shift:  No intake/output data recorded. Weight change:  Gen: NAD CVS: no rub Resp: cta Abd: +BS, soft, nt/nd Ext: no edema, LAVF +T/B  Recent Labs  Lab 05/29/19 0956 05/30/19 0414 05/31/19 0330 06/01/19 0244 06/01/19 0835 06/02/19 0211 06/03/19 0334  NA 131* 135 135 130* 133* 134* 134*  K 4.4 3.8 4.5 6.5* 4.8 4.2 4.7  CL 91* 94* 93* 90* 93* 94* 93*  CO2 22 26 26 23 24 26 23   GLUCOSE 144* 117* 102* 100* 97 108* 113*  BUN 98* 32* 52* 78* 77* 38* 56*  CREATININE 10.09* 5.82* 7.89* 10.29* 10.56* 6.98* 9.44*  ALBUMIN 2.7* 2.6* 2.6* 2.5* 2.5* 2.7* 2.6*  CALCIUM 10.2 9.5 10.4* 9.5 9.8 9.8 10.3  PHOS 6.3* 4.8* 5.8* 6.5* 6.6* 5.5* 7.4*   Liver Function Tests: Recent Labs  Lab 06/01/19 0835 06/02/19 0211 06/03/19 0334  ALBUMIN 2.5* 2.7* 2.6*   No results for input(s): LIPASE, AMYLASE in the last 168 hours. No results for input(s): AMMONIA in the last 168 hours. CBC: Recent Labs  Lab 06/01/19 0244 06/03/19 1350  WBC 10.5 12.5*  HGB 9.4* 9.1*  HCT 28.9* 28.6*  MCV 84.8 86.4  PLT 466* 438*   Cardiac Enzymes: No results for input(s): CKTOTAL, CKMB, CKMBINDEX, TROPONINI in the last 168 hours. CBG: Recent Labs  Lab 06/01/19 0629  GLUCAP 86    Iron Studies: No results for input(s): IRON, TIBC, TRANSFERRIN, FERRITIN in the last 72 hours. Studies/Results: No results found. Marland Kitchen acetaminophen  650 mg Oral Q4H  . allopurinol  100 mg Oral Daily  .  aspirin  81 mg Oral Daily  . atorvastatin  80 mg Oral Daily  . carvedilol  6.25 mg Oral BID WC  . Chlorhexidine Gluconate Cloth  6 each Topical Q0600  . cinacalcet  30 mg Oral Q supper  . collagenase   Topical Daily  . darbepoetin (ARANESP) injection - DIALYSIS  200 mcg Intravenous Q Tue-HD  . feeding supplement (NEPRO CARB STEADY)  237 mL Oral TID BM  . feeding supplement (PRO-STAT SUGAR FREE 64)  30 mL Oral TID  . heparin injection (subcutaneous)  5,000 Units Subcutaneous Q8H  . multivitamin  1 tablet Oral QHS  . nutrition supplement (JUVEN)  1 packet Oral BID BM  . polyethylene glycol  17 g Oral BID  . senna  1 tablet Oral Daily  . sevelamer carbonate  2.4 g Oral TID WC    BMET    Component Value Date/Time   NA 134 (L) 06/03/2019 0334   K 4.7 06/03/2019 0334   CL 93 (L) 06/03/2019 0334   CO2 23 06/03/2019 0334   GLUCOSE 113 (H) 06/03/2019 0334   BUN 56 (H) 06/03/2019 0334   CREATININE 9.44 (H) 06/03/2019 0334   CREATININE 9.06 (H) 08/31/2015 1041   CALCIUM 10.3 06/03/2019 0334  GFRNONAA 6 (L) 06/03/2019 0334   GFRNONAA 6 (L) 08/31/2015 1041   GFRAA 7 (L) 06/03/2019 0334   GFRAA 7 (L) 08/31/2015 1041   CBC    Component Value Date/Time   WBC 12.5 (H) 06/03/2019 1350   RBC 3.31 (L) 06/03/2019 1350   HGB 9.1 (L) 06/03/2019 1350   HCT 28.6 (L) 06/03/2019 1350   PLT 438 (H) 06/03/2019 1350   MCV 86.4 06/03/2019 1350   MCH 27.5 06/03/2019 1350   MCHC 31.8 06/03/2019 1350   RDW 18.7 (H) 06/03/2019 1350   LYMPHSABS 1.3 05/19/2019 2050   MONOABS 1.5 (H) 05/19/2019 2050   EOSABS 0.1 05/19/2019 2050   BASOSABS 0.0 05/19/2019 2050   Dialysis: TTSEast 4.5h F250 124.5kg 400/800 2/2.0Ca (low Ca here for ^Ca) Hep 12000 AVF - mircera 30 q4 last 10/13 - hect 4  Assessment/Plan:  1. ESRD- on HD TTS.  Not strong enough to sit in recliner for full treatment.   2. Debility- significant weakness as well as cognitive deficits.  Will need SNF placement, however he is  too weak for outpatient HD (unable to sit in recliner long enough. 3. Anemia of CKD stage 5 with ABLA s/p transfusions last month.  Cont with ESA 4. H/o COVID PNA- completed treatment and off of airborne precautions 5. SHPTH with hypercalcemia- on low Ca bath 6. Seizure d/o- on keppra. 7. Sacral decub- per primary.  Donetta Potts, MD [employer] [phone]

## 2019-06-04 NOTE — Progress Notes (Signed)
Physical Therapy Treatment Patient Details Name: Troy Wells MRN: [id number] DOB: 08-Dec-1965 Today's Date: 06/04/2019    History of Present Illness 53 yo male presenting to [facility] with cardiac arrest and CPR initatied by EMS. Pt at dialysis, complained of SOB, and became unresponsive. Pt with prior hospital admission (10/27 - 11/3) for COVID associated pneumonia; active problems of acute hypoxemic respiratory failure NSTEMI during admission. Intubated 11/10-11/23. MRI showing bilateral medial thalami is suspicious for anoxic injury and chronic R PICA cerebellar infarct.  Transitioned off of CRRT 05/11/19, femoral line removed 05/13/19.PMH including ESRD on HD, chronic anemia, chronic systolic heart failure, CAD, HTN, OSA on CPAP, DM2, and HLD.    PT Comments    Patient received in bed, daughter present and observed session today. Able to complete bed mobility with levels of assist as below, continues to have severe and violent shaking movements with all intentional movements- RN educated regarding PT concerns. Stood in stedy with maxAx2 and was pivoted to chair totalA in stedy; maxAx2 to stand from stedy seat and then was positioned in chair to comfort with use of Rojo cushion on loan from [employer]. Per daughter, was then able to tolerate 35 minutes sitting much more comfortably on Rojo cushion but then was returned to bed by nursing due to needing to have a BM. Will continue to follow up with use of Rojo to attempt to facilitate discharge.   Follow Up Recommendations  SNF;Supervision/Assistance - 24 hour     Equipment Recommendations  Hospital bed;Other (comment)(hoyer lift)    Recommendations for Other Services       Precautions / Restrictions Precautions Precautions: Fall Precaution Comments: flat affect, minimal memory, seizure like shaking with movement (ROJO is from CIR and needs to be returned)  Restrictions Weight Bearing Restrictions: No    Mobility  Bed Mobility Overal bed  mobility: Needs Assistance Bed Mobility: Sidelying to Sit Rolling: Min assist Sidelying to sit: Max assist;+2 for physical assistance       General bed mobility comments: maxAx2 to manage LEs and boost trunk to upright, increased time and effort, continues to have severe pain from sacral ulcer when sitting  Transfers Overall transfer level: Needs assistance Equipment used: Ambulation equipment used Transfers: Sit to/from [facility] Sit to Stand: Max assist;+2 physical assistance;From elevated surface Stand pivot transfers: Total assist       General transfer comment: MaxAx2 to power up in stedy, cues for hand palcement and sequencing; totalA pivot in stedy  Ambulation/Gait             General Gait Details: unable   Stairs             Wheelchair Mobility    Modified Rankin (Stroke Patients Only)       Balance Overall balance assessment: Needs assistance Sitting-balance support: Bilateral upper extremity supported Sitting balance-Leahy Scale: Poor Sitting balance - Comments: min guard assist due to jerking movements   Standing balance support: Bilateral upper extremity supported Standing balance-Leahy Scale: Poor Standing balance comment: stood momentarily in stedy with B UE support                            Cognition Arousal/Alertness: Awake/alert Behavior During Therapy: Flat affect Overall Cognitive Status: Impaired/Different from baseline Area of Impairment: Attention;Memory;Following commands;Safety/judgement;Awareness;Problem solving                 Orientation Level: Disoriented to;Time;Situation Current Attention Level: Sustained Memory: Decreased short-term memory  Following Commands: Follows one step commands inconsistently;Follows one step commands with increased time Safety/Judgement: Decreased awareness of deficits;Decreased awareness of safety Awareness: Intellectual Problem Solving: Slow  processing;Decreased initiation;Difficulty sequencing;Requires verbal cues;Requires tactile cues General Comments: able to follow most cues with increased time      Exercises      General Comments General comments (skin integrity, edema, etc.): continues to have severe shaking motion (not unlike huntingtons movements) with all intentional movements, RN aware      Pertinent Vitals/Pain Pain Assessment: Faces Faces Pain Scale: Hurts even more Pain Location: low back and sacrum when pressure applied to that area in sitting Pain Descriptors / Indicators: Grimacing;Guarding;Pressure Pain Intervention(s): Limited activity within patient's tolerance;Monitored during session;Repositioned    Home Living                      Prior Function            PT Goals (current goals can now be found in the care plan section) Acute Rehab PT Goals Patient Stated Goal: to get stronger Time For Goal Achievement: 06/12/19 Potential to Achieve Goals: Fair Progress towards PT goals: Progressing toward goals    Frequency    Min 3X/week      PT Plan Current plan remains appropriate    Co-evaluation PT/OT/SLP Co-Evaluation/Treatment: Yes Reason for Co-Treatment: Complexity of the patient's impairments (multi-system involvement);Necessary to address cognition/behavior during functional activity;For patient/therapist safety;To address functional/ADL transfers   OT goals addressed during session: Strengthening/ROM      AM-PAC PT "6 Clicks" Mobility   Outcome Measure  Help needed turning from your back to your side while in a flat bed without using bedrails?: A Lot Help needed moving from lying on your back to sitting on the side of a flat bed without using bedrails?: A Lot Help needed moving to and from a bed to a chair (including a wheelchair)?: Total Help needed standing up from a chair using your arms (e.g., wheelchair or bedside chair)?: Total Help needed to walk in hospital  room?: Total Help needed climbing 3-5 steps with a railing? : Total 6 Click Score: 8    End of Session   Activity Tolerance: Patient tolerated treatment well Patient left: in chair;with call bell/phone within reach;with family/visitor present   PT Visit Diagnosis: Other abnormalities of gait and mobility (R26.89);Muscle weakness (generalized) (M62.81);Difficulty in walking, not elsewhere classified (R26.2);Pain Pain - part of body: (sacrum, low back)     Time: 3016-0109 PT Time Calculation (min) (ACUTE ONLY): 38 min  Charges:  $Therapeutic Activity: 23-37 mins(co-tx with OT)                     Windell Norfolk, DPT, PN1   Supplemental Physical Therapist [place]    Pager [phone] Acute Rehab Office [phone]

## 2019-06-04 NOTE — Progress Notes (Signed)
IV team successful in restarting iv site in RT ac- administered iv pain medication- then dressing changes completed

## 2019-06-04 NOTE — Progress Notes (Signed)
Occupational Therapy Treatment Patient Details Name: Troy Wells MRN: [id number] DOB: 1966/02/19 Today's Date: 06/04/2019    History of present illness 53 yo male presenting to [facility] with cardiac arrest and CPR initatied by EMS. Pt at dialysis, complained of SOB, and became unresponsive. Pt with prior hospital admission (10/27 - 11/3) for COVID associated pneumonia; active problems of acute hypoxemic respiratory failure NSTEMI during admission. Intubated 11/10-11/23. MRI showing bilateral medial thalami is suspicious for anoxic injury and chronic R PICA cerebellar infarct.  Transitioned off of CRRT 05/11/19, femoral line removed 05/13/19.PMH including ESRD on HD, chronic anemia, chronic systolic heart failure, CAD, HTN, OSA on CPAP, DM2, and HLD.   OT comments  Pt immediately willing to work on getting OOB and gradually increasing his tolerance of sitting in the recliner in preparation for HD in the community. Borrowed Roho cushion from [employer] and placed in recliner. 2 person assist and use of stedy to transfer pt to chair where he remained x 45 minutes until needing to be returned to bed for use of bed pan. Progressing steadily.  Follow Up Recommendations  SNF;Supervision/Assistance - 24 hour    Equipment Recommendations  Wheelchair (measurements OT);Wheelchair cushion (measurements OT)(ROHO w/c cushion)    Recommendations for Other Services      Precautions / Restrictions Precautions Precautions: Fall Precaution Comments: flat affect, minimal memory, seizure like shaking with movement Restrictions Weight Bearing Restrictions: No       Mobility Bed Mobility Overal bed mobility: Needs Assistance Bed Mobility: Sidelying to Sit   Sidelying to sit: +2 for physical assistance;Max assist       General bed mobility comments: guidance for LEs over EOB, assis to raise trunk  Transfers Overall transfer level: Needs assistance Equipment used: Ambulation equipment used Transfers: Sit  to/from Stand Sit to Stand: +2 physical assistance;Mod assist         General transfer comment: cues to use UEs to pull up on stedy, assist to rise from elevated bed    Balance Overall balance assessment: Needs assistance   Sitting balance-Leahy Scale: Poor Sitting balance - Comments: min guard assist due to jerking movements   Standing balance support: Bilateral upper extremity supported Standing balance-Leahy Scale: Poor Standing balance comment: stood momentarily in stedy with B UE support                           ADL either performed or assessed with clinical judgement   ADL Overall ADL's : Needs assistance/impaired                 Upper Body Dressing : Minimal assistance;Bed level Upper Body Dressing Details (indicate cue type and reason): placed arms through armholes, assist to tie     Toilet Transfer: Total assistance;+2 for physical assistance Toilet Transfer Details (indicate cue type and reason): simulated to chair with stedy                 Vision       Perception     Praxis      Cognition Arousal/Alertness: Awake/alert Behavior During Therapy: Flat affect Overall Cognitive Status: Impaired/Different from baseline Area of Impairment: Attention;Memory;Following commands;Safety/judgement;Awareness;Problem solving                 Orientation Level: Disoriented to;Time;Situation Current Attention Level: Sustained Memory: Decreased short-term memory Following Commands: Follows one step commands inconsistently;Follows one step commands with increased time Safety/Judgement: Decreased awareness of deficits;Decreased awareness of safety Awareness: Intellectual Problem  Solving: Slow processing;Decreased initiation;Difficulty sequencing;Requires verbal cues;Requires tactile cues General Comments: able to follow most cues with increased time        Exercises     Shoulder Instructions       General Comments      Pertinent  Vitals/ Pain       Pain Assessment: Faces Faces Pain Scale: Hurts even more Pain Location: low back and sacrum when pressure applied to that area in sitting Pain Descriptors / Indicators: Grimacing;Guarding;Pressure Pain Intervention(s): Limited activity within patient's tolerance;Monitored during session;Repositioned  Home Living                                          Prior Functioning/Environment              Frequency  Min 2X/week        Progress Toward Goals  OT Goals(current goals can now be found in the care plan section)  Progress towards OT goals: Progressing toward goals  Acute Rehab OT Goals Patient Stated Goal: to get stronger OT Goal Formulation: With patient Time For Goal Achievement: 06/09/19 Potential to Achieve Goals: Good  Plan Discharge plan remains appropriate    Co-evaluation    PT/OT/SLP Co-Evaluation/Treatment: Yes Reason for Co-Treatment: Complexity of the patient's impairments (multi-system involvement);Necessary to address cognition/behavior during functional activity;For patient/therapist safety;To address functional/ADL transfers   OT goals addressed during session: Strengthening/ROM      AM-PAC OT "6 Clicks" Daily Activity     Outcome Measure   Help from another person eating meals?: A Lot Help from another person taking care of personal grooming?: A Little Help from another person toileting, which includes using toliet, bedpan, or urinal?: A Lot Help from another person bathing (including washing, rinsing, drying)?: A Lot Help from another person to put on and taking off regular upper body clothing?: A Little Help from another person to put on and taking off regular lower body clothing?: A Lot 6 Click Score: 14    End of Session    OT Visit Diagnosis: Unsteadiness on feet (R26.81);Other abnormalities of gait and mobility (R26.89);Muscle weakness (generalized) (M62.81)   Activity Tolerance Patient tolerated  treatment well   Patient Left in chair;with call bell/phone within reach;with family/visitor present   Nurse Communication Other (comment);Mobility status;Need for lift equipment(Use ROHO cushion in recliner)        Time: 9371-6967 OT Time Calculation (min): 38 min  Charges: OT General Charges $OT Visit: 1 Visit OT Treatments $Therapeutic Activity: 8-22 mins  Nestor Lewandowsky, OTR/L Acute Rehabilitation Services Pager: [phone] Office: [phone]   Malka So 06/04/2019, 3:49 PM

## 2019-06-05 LAB — CBC
HCT: 27.9 % — ABNORMAL LOW (ref 39.0–52.0)
Hemoglobin: 9 g/dL — ABNORMAL LOW (ref 13.0–17.0)
MCH: 27.7 pg (ref 26.0–34.0)
MCHC: 32.3 g/dL (ref 30.0–36.0)
MCV: 85.8 fL (ref 80.0–100.0)
Platelets: 376 10*3/uL (ref 150–400)
RBC: 3.25 MIL/uL — ABNORMAL LOW (ref 4.22–5.81)
RDW: 18.4 % — ABNORMAL HIGH (ref 11.5–15.5)
WBC: 8.7 10*3/uL (ref 4.0–10.5)
nRBC: 0 % (ref 0.0–0.2)

## 2019-06-05 LAB — RENAL FUNCTION PANEL
Albumin: 2.6 g/dL — ABNORMAL LOW (ref 3.5–5.0)
Anion gap: 18 — ABNORMAL HIGH (ref 5–15)
BUN: 48 mg/dL — ABNORMAL HIGH (ref 6–20)
CO2: 25 mmol/L (ref 22–32)
Calcium: 10.1 mg/dL (ref 8.9–10.3)
Chloride: 91 mmol/L — ABNORMAL LOW (ref 98–111)
Creatinine, Ser: 9.03 mg/dL — ABNORMAL HIGH (ref 0.61–1.24)
GFR calc Af Amer: 7 mL/min — ABNORMAL LOW (ref >60)
GFR calc non Af Amer: 6 mL/min — ABNORMAL LOW (ref >60)
Glucose, Bld: 109 mg/dL — ABNORMAL HIGH (ref 70–99)
Phosphorus: 7 mg/dL — ABNORMAL HIGH (ref 2.5–4.6)
Potassium: 4.2 mmol/L (ref 3.5–5.1)
Sodium: 134 mmol/L — ABNORMAL LOW (ref 135–145)

## 2019-06-05 MED ORDER — HEPARIN SODIUM (PORCINE) 1000 UNIT/ML DIALYSIS: 20.0000 [IU]/kg | INTRAMUSCULAR | Status: DC | PRN

## 2019-06-05 MED ORDER — ALBUMIN HUMAN 25 % IV SOLN
12.5000 g | Freq: Once | INTRAVENOUS | Status: AC
  Administered 2019-06-05: 12.5 g via INTRAVENOUS

## 2019-06-05 MED ORDER — ALBUMIN HUMAN 25 % IV SOLN
INTRAVENOUS | Status: AC
  Filled 2019-06-05: qty 50

## 2019-06-05 NOTE — Procedures (Signed)
I was present at this dialysis session. I have reviewed the session itself and made appropriate changes. Tolerating dialysis well.  No complaints.   Vital signs in last 24 hours:  Temp:  [98.8 F (37.1 C)-99.2 F (37.3 C)] 98.8 F (37.1 C) (12/18 2300) Pulse Rate:  [88-93] 88 (12/18 2300) Resp:  [16-18] 16 (12/18 2300) BP: (125-137)/(72-113) 130/72 (12/18 2300) SpO2:  [98 %] 98 % (12/18 2300) Weight change:  Filed Weights   06/01/19 0500 06/02/19 0500 06/04/19 0419  Weight: 93 kg 93 kg 92.5 kg    Recent Labs  Lab 06/05/19 0729  NA 134*  K 4.2  CL 91*  CO2 25  GLUCOSE 109*  BUN 48*  CREATININE 9.03*  CALCIUM 10.1  PHOS 7.0*    Recent Labs  Lab 06/01/19 0244 06/03/19 1350 06/05/19 0730  WBC 10.5 12.5* 8.7  HGB 9.4* 9.1* 9.0*  HCT 28.9* 28.6* 27.9*  MCV 84.8 86.4 85.8  PLT 466* 438* 376    Scheduled Meds: . acetaminophen  650 mg Oral Q4H  . allopurinol  100 mg Oral Daily  . aspirin  81 mg Oral Daily  . atorvastatin  80 mg Oral Daily  . carvedilol  6.25 mg Oral BID WC  . Chlorhexidine Gluconate Cloth  6 each Topical Q0600  . cinacalcet  30 mg Oral Q supper  . collagenase   Topical Daily  . darbepoetin (ARANESP) injection - DIALYSIS  200 mcg Intravenous Q Tue-HD  . feeding supplement (NEPRO CARB STEADY)  237 mL Oral TID BM  . feeding supplement (PRO-STAT SUGAR FREE 64)  30 mL Oral TID  . heparin injection (subcutaneous)  5,000 Units Subcutaneous Q8H  . multivitamin  1 tablet Oral QHS  . nutrition supplement (JUVEN)  1 packet Oral BID BM  . polyethylene glycol  17 g Oral BID  . senna  1 tablet Oral Daily  . sevelamer carbonate  2.4 g Oral TID WC   Continuous Infusions: PRN Meds:.heparin, morphine injection, oxyCODONE    Dialysis:TTSEast 4.5h F250 124.5kg 400/800 2/2.0Ca (low Ca here for ^Ca) Hep 12000 AVF - mircera 30 q4 last 10/13 - hect 4  Assessment/Plan:  1. ESRD- on HD TTS.  Not strong enough to sit in recliner for full treatment.    2. Debility- significant weakness as well as cognitive deficits.  Will need SNF placement, however he is too weak for outpatient HD (unable to sit in recliner long enough. 3. Anemia of CKD stage 5 with ABLA s/p transfusions last month.  Cont with ESA 4. H/o COVID PNA- completed treatment and off of airborne precautions 5. SHPTH with hypercalcemia- on low Ca bath 6. Seizure d/o- on keppra. 7. Sacral decub- per primary.  Donetta Potts,  MD 06/05/2019, 8:25 AM

## 2019-06-05 NOTE — Progress Notes (Addendum)
Family Medicine Teaching Service Daily Progress Note Intern Pager: [phone]  Patient name: Troy Wells Medical record number: [id number] Date of birth: 1966/06/11 Age: 53 y.o. Gender: male  Primary Care Provider: [name], MD Consultants: Neurology, critical care, nephrology Code Status: Full   Pt Overview and Major Events to Date:  04/27/2019 admitted to ICU 05/02/2019 MRI brain showing anoxic brain injury 05/10/2019: Extubated 05/12/2019: Started receiving care under FPTS 05/20/2019: EEG w/o seizure, Neuro signed off 05/21/2019: Stable for discharge to SNF  Assessment and Plan: Troy Wells is a 53 y.o. male transferred from ICU after cardiac arrest with recent history of COVID-19 infection.  PMH significant for ESRD MWF on HD, OSA, HTN, HLD, HFpEF, pHTN, type 2 diabetes, and diabetic neuropathy.  Encephalopathy  Possible anoxic brain injury PEA cardiac arrest Dispo pending ability to maintain upright position during HD. Alert but short term memory wax and wanes. -PT/OT consulted, appreciate recommendations -Monitor for improvement - SNF dispo -Contacted daughter on 12/17: would like her father to be full code and have aggressive measures for his treatment   LUE/sacral ulcer Will need to demonstrate ability to sit upright for dialysis for > 3hrs prior to discharge to SNF.  -Up to chair and sit as tolerated -Continue wound care BID; hydrotherapy daily   -Tylenol PRN -Continue turning patient Q2h more on the right side due to left shoulder wond  Protein calorie malnutrition  Pre-albumin 36.7 on 12/16 indicating the need for nutritional support.  Has been eating 5-50% of meals, eats more with his daughter at bedside. Ate 80% of lunch yesterday, no documented intake for dinner. -Encourage PO intake -RN to document % of food eaten at each meal  -If persistent poor PO intake consider feeding tube   COVIDEnterobacterHAP  Leukocytosis (Resolved) Sats 98%  on RA  Patient now greater than 21 days out from Covid diagnosis. Has been cleared by infectious disease. Repeat Covid test on 05/24/2019 is negative.  -may need repeat COVID prior to dc to SNF   ESRD on HDHypercalcemia (Resolved) Secondary hyperparathyroidism Dialysis per nephro TTS schedule Labs on 12/17: Cr 9.44, BUN 56, Na 134, K 4.7, Ca 10.3, Phosp 7.4  -Renal function panel three times a week -CBC once weekly -Nephrology following, appreciate recs -Aranesp every Tuesday with HD -Senispar 30mg  daily, renvela 2.4g TID -Oxycodone prior to HD and during HD  Constipation-treating  Last BM 12/18. Incontinent.    -Prescribed Miralax BID and Dulcolax  -Added Senna today   HFmEF CAD [facility] 04/27/19 40 to 45%.  LV with mild to moderate decreased fxn,mildly increased LVH,inferiolateral akinesis and anterolateral severe hypokinesis. Grade 2 diastolic dysfunction.  -Continue aspirin 81 -Continue Lipitor 80 mg daily -Coreg 12.5 mg twice daily  Hypertension  Tachycardia, stable  BP 509T systolic, HR 89 -Continue Coreg 12.5 mg twice daily   OSA with use of CPAP OSA at baseline and uses CPAP intermittently. Now 21+ days out from Covid diagnosis, can use CPAP if needed. Refused CPAP ON.  -Continue to encourage patient to use CPAP at bedtime  Diabetes CBG 86 on 12/16, no documented CBGs for today. Glu on 12/17 RFP 113 Home medications include glipizide 5 mg BID.Last A1c 5.8 on 04/14/2019.  -Continue to hold home glipizide -would consider discontinuing glipizide on dc to avoid hypoglycemia  HLD Home medications include atorvastatin 40mg  -Atorvastatin 80mg  daily  Diabetic Neuropathy  Patient on home gabapentin 300 mg TID. Patient reports only taking BID.  -continue to hold homegabapentin  Gout  Home medications:  Allopurinol 100mg  daily and colchicine 0.6mg  BID as needed when symptomatic. -Continue home allopurinol  Social/Disposition Bed was available at  [facility] 12/9, patient still needs improvement before discharge -Discharge to SNF when strength and nutrition improve  FEN/GI: Dysphagia 3 diet DHR:CBULAGT 5000 units subq 3 times daily  Disposition: pending ability to tolerate sitting at HD  Subjective:  Says he is doing ok. No concerns or discomfort at this time.   Objective: Temp:  [98.5 F (36.9 C)-99.2 F (37.3 C)] 98.8 F (37.1 C) (12/18 2300) Pulse Rate:  [74-93] 88 (12/18 2300) Resp:  [16-19] 16 (12/18 2300) BP: (81-137)/(70-113) 130/72 (12/18 2300) SpO2:  [98 %-100 %] 98 % (12/18 2300)  General: Appears well, no acute distress. Age appropriate. Lying right lateral decubitus.  Cardiac: RRR, normal heart sounds, no murmurs Respiratory: CTAB, normal effort Extremities: No edema or cyanosis. Skin: Warm and dry, no rashes noted. Pressure wounds w/ clean dressings. Neuro: alert and oriented, no focal deficits Psych: normal affect  Laboratory: Recent Labs  Lab 06/01/19 0244 06/03/19 1350  WBC 10.5 12.5*  HGB 9.4* 9.1*  HCT 28.9* 28.6*  PLT 466* 438*   Recent Labs  Lab 06/01/19 0835 06/02/19 0211 06/03/19 0334  NA 133* 134* 134*  K 4.8 4.2 4.7  CL 93* 94* 93*  CO2 24 26 23   BUN 77* 38* 56*  CREATININE 10.56* 6.98* 9.44*  CALCIUM 9.8 9.8 10.3  GLUCOSE 97 108* 113*    Imaging/Diagnostic Tests: No new images   Autry-Lott, Troy Plummer, DO 06/05/2019, 5:43 AM PGY-1, [place] Intern pager: [phone], text pages welcome

## 2019-06-05 NOTE — Progress Notes (Signed)
Report given to Hemodialysis RN.  RN made aware that goal for patient is to sit in the chair during dialysis.

## 2019-06-05 NOTE — Progress Notes (Addendum)
FMTS Brief Note  Patient seen in HD with 30 minutes left to session. Patient in bed, fully reclined. Per nursing, was unaware only remaining barrier to discharge is ability to tolerate HD in chair.   Patient placed at 45 degrees to see if can tolerate remainder of session upright.   Patient to be pretreated for pain before HD and must trial HD in chair. Again, this only remaining barrier to discharge   Dorris Singh, MD  [facility] Medicine Teaching Service

## 2019-06-05 NOTE — TOC Progression Note (Signed)
Transition of Care [facility]) - Progression Note    Patient Details  Name: Yerachmiel Spinney MRN: [id number] Date of Birth: 03-22-1966  Transition of Care [facility]) CM/SW [place], LCSW Phone Number: 06/05/2019, 8:49 AM  Clinical Narrative:    TOC team continuing to follow for patient ability to tolerate sitting up for dialysis.    Expected Discharge Plan: Skilled Nursing Facility Barriers to Discharge: No Barriers Identified  Expected Discharge Plan and Services Expected Discharge Plan: [place] In-house Referral: Clinical Social Work   Post Acute Care Choice: IP Rehab Living arrangements for the past 2 months: Apartment                 DME Arranged: N/A         HH Arranged: NA           Social Determinants of Health (SDOH) Interventions    Readmission Risk Interventions Readmission Risk Prevention Plan 05/17/2019  Transportation Screening Complete  Medication Review [employer]) Complete  PCP or Specialist appointment within 3-5 days of discharge Complete  HRI or Home Care Consult Complete  SW Recovery Care/Counseling Consult Complete  Palliative Care Screening Not [place] Complete  Some recent data might be hidden

## 2019-06-06 NOTE — Progress Notes (Signed)
Family Medicine Teaching Service Daily Progress Note Intern Pager: [phone]  Patient name: Troy Wells Medical record number: [id number] Date of birth: [birth year] Age: 53 y.o. Gender: male  Primary Care Provider: [name], MD Consultants: Neurology, critical care, nephrology Code Status: Full   Pt Overview and Major Events to Date:  04/27/2019 admitted to ICU 05/02/2019 MRI brain showing anoxic brain injury 05/10/2019: Extubated 05/12/2019: Started receiving care under FPTS 05/20/2019: EEG w/o seizure, Neuro signed off 05/21/2019: Stable for discharge to SNF  Assessment and Plan: Troy Wells is a 53 y.o. male transferred from ICU after cardiac arrest with recent history of COVID-19 infection.  PMH significant for ESRD MWF on HD, OSA, HTN, HLD, HFpEF, pHTN, type 2 diabetes, and diabetic neuropathy.  Encephalopathy  Possible anoxic brain injury PEA cardiac arrest Improving slowly, ANO X 2, able to have a longer conversation with me today compared to previous days Dispo pending ability to maintain upright position during HD. Alert but short term memory wax and wanes.  -PT/OT consulted, appreciate recommendations -Monitor for improvement - SNF dispo  LUE/sacral ulcer Will need to demonstrate ability to sit upright for dialysis for > 3hrs prior to discharge to SNF.   -Up to chair and sit as tolerated -Continue wound care BID; hydrotherapy daily   -Tylenol PRN -Continue turning patient Q2h more on the right side due to left shoulder wond  Protein calorie malnutrition  Improving slowly Has been eating 50-80% of meals, eats more with his daughter at bedside.  -Encourage PO intake -RN to document % of food eaten at each meal  -If persistent poor PO intake consider feeding tube   COVIDEnterobacterHAP  Leukocytosis (Resolved) Sats 100% on RA  Patient now greater than 21 days out from Covid diagnosis. Has been cleared by infectious disease. Repeat Covid test  on 05/24/2019 is negative.  -may need repeat COVID prior to dc to SNF   ESRD on HDHypercalcemia (Resolved) Secondary hyperparathyroidism Per nephro note on 12/19: No strong enough to sit in recliner for full treatment. Dialysis per nephro TTS schedule Labs on 12/17: Cr 9.44, BUN 56, Na 134, K 4.7, Ca 10.3, Phosp 7.4  -Renal function panel three times a week -CBC once weekly -Nephrology following, appreciate recs -Aranesp every Tuesday with HD -Senispar 30mg  daily, renvela 2.4g TID -Oxycodone prior to HD and during HD  Constipation-treating  Last BM 12/18. Incontinent.    -Prescribed Miralax BID and Dulcolax  -Added Senna today   HFmEF CAD [facility] 04/27/19 40 to 45%.  LV with mild to moderate decreased fxn,mildly increased LVH,inferiolateral akinesis and anterolateral severe hypokinesis. Grade 2 diastolic dysfunction.  -Continue aspirin 81 -Continue Lipitor 80 mg daily -Coreg 12.5 mg twice daily  Hypertension  Tachycardia, stable  BP 824M systolic, HR 89 -Continue Coreg 12.5 mg twice daily   OSA with use of CPAP OSA at baseline and uses CPAP intermittently. Now 21+ days out from Covid diagnosis, can use CPAP if needed. Refused CPAP ON.  -Continue to encourage patient to use CPAP at bedtime  Diabetes CBG 86 on 12/16, no documented CBGs for today. Glu on 12/17 RFP 113 Home medications include glipizide 5 mg BID.Last A1c 5.8 on 04/14/2019.  -Continue to hold home glipizide -would consider discontinuing glipizide on dc to avoid hypoglycemia  HLD Home medications include atorvastatin 40mg  -Atorvastatin 80mg  daily  Diabetic Neuropathy  Patient on home gabapentin 300 mg TID. Patient reports only taking BID.  -continue to hold homegabapentin  Gout  Home medications: Allopurinol 100mg  daily  and colchicine 0.6mg  BID as needed when symptomatic. -Continue home allopurinol  Social/Disposition Bed was available at [facility] 12/9, patient still needs  improvement before discharge -Discharge to SNF when strength and nutrition improve  FEN/GI: Dysphagia 3 diet UOR:VIFBPPH 5000 units subq 3 times daily  Disposition: pending ability to tolerate sitting at HD  Subjective:  Mr Troy Wells was able to have a longer conversation with me today asked me how I was doing and told me that there was nothing good to watch on TV. He said he sat up in the chair for 30 mins this morning before he got uncomfortable.   Objective: Temp:  [98.1 F (36.7 C)-98.8 F (37.1 C)] 98.2 F (36.8 C) (12/20 0829) Pulse Rate:  [79-94] 79 (12/20 0829) Resp:  [16-18] 16 (12/20 0829) BP: (98-130)/(47-86) 130/80 (12/20 0829) SpO2:  [99 %-100 %] 100 % (12/20 0829) Weight:  [91.6 kg] 91.6 kg (12/19 1056)  General: Alert, cooperative and appears to be in no acute distress Cardio: Normal S1 and S2, RRR, No murmurs or rubs.   Pulm: CTAB, no crackles, wheezing, or diminished breath sounds. Normal respiratory effort Abdomen: Bowel sounds normal. Abdomen soft and non-tender.  Extremities: No peripheral edema.  Neuro: Cranial nerves grossly intact, ANO X2   Laboratory: Recent Labs  Lab 06/01/19 0244 06/03/19 1350 06/05/19 0730  WBC 10.5 12.5* 8.7  HGB 9.4* 9.1* 9.0*  HCT 28.9* 28.6* 27.9*  PLT 466* 438* 376   Recent Labs  Lab 06/02/19 0211 06/03/19 0334 06/05/19 0729  NA 134* 134* 134*  K 4.2 4.7 4.2  CL 94* 93* 91*  CO2 26 23 25   BUN 38* 56* 48*  CREATININE 6.98* 9.44* 9.03*  CALCIUM 9.8 10.3 10.1  GLUCOSE 108* 113* 109*    Imaging/Diagnostic Tests: No new images   Lattie Haw, MD 06/06/2019, 10:23 AM PGY-1, [place] Intern pager: [phone], text pages welcome

## 2019-06-06 NOTE — Progress Notes (Signed)
Patient ID: Troy Wells, male   DOB: [birth year], 53 y.o.   MRN: [id number] S: Was hypotensive on HD yesterday but able to UF 1 liter. O:BP 130/80 (BP Location: Right Arm)   Pulse 79   Temp 98.2 F (36.8 C) (Oral)   Resp 16   Ht 5\' 10"  (1.778 m)   Wt 91.6 kg   SpO2 100%   BMI 28.98 kg/m   Intake/Output Summary (Last 24 hours) at 06/06/2019 1206 Last data filed at 06/06/2019 0400 Gross per 24 hour  Intake 480 ml  Output --  Net 480 ml   Intake/Output: I/O last 3 completed shifts: In: 480 [P.O.:480] Out: 1000 [Other:1000]  Intake/Output this shift:  No intake/output data recorded. Weight change: -1.361 kg Gen: NAD CVS: no rub Resp: cta Abd: benign Ext: no edema, LAVF +T/B  Recent Labs  Lab 05/31/19 0330 06/01/19 0244 06/01/19 0835 06/02/19 0211 06/03/19 0334 06/05/19 0729  NA 135 130* 133* 134* 134* 134*  K 4.5 6.5* 4.8 4.2 4.7 4.2  CL 93* 90* 93* 94* 93* 91*  CO2 26 23 24 26 23 25   GLUCOSE 102* 100* 97 108* 113* 109*  BUN 52* 78* 77* 38* 56* 48*  CREATININE 7.89* 10.29* 10.56* 6.98* 9.44* 9.03*  ALBUMIN 2.6* 2.5* 2.5* 2.7* 2.6* 2.6*  CALCIUM 10.4* 9.5 9.8 9.8 10.3 10.1  PHOS 5.8* 6.5* 6.6* 5.5* 7.4* 7.0*   Liver Function Tests: Recent Labs  Lab 06/02/19 0211 06/03/19 0334 06/05/19 0729  ALBUMIN 2.7* 2.6* 2.6*   No results for input(s): LIPASE, AMYLASE in the last 168 hours. No results for input(s): AMMONIA in the last 168 hours. CBC: Recent Labs  Lab 06/01/19 0244 06/03/19 1350 06/05/19 0730  WBC 10.5 12.5* 8.7  HGB 9.4* 9.1* 9.0*  HCT 28.9* 28.6* 27.9*  MCV 84.8 86.4 85.8  PLT 466* 438* 376   Cardiac Enzymes: No results for input(s): CKTOTAL, CKMB, CKMBINDEX, TROPONINI in the last 168 hours. CBG: Recent Labs  Lab 06/01/19 0629  GLUCAP 86    Iron Studies: No results for input(s): IRON, TIBC, TRANSFERRIN, FERRITIN in the last 72 hours. Studies/Results: No results found. Marland Kitchen acetaminophen  650 mg Oral Q4H  . allopurinol  100 mg Oral  Daily  . aspirin  81 mg Oral Daily  . atorvastatin  80 mg Oral Daily  . carvedilol  6.25 mg Oral BID WC  . Chlorhexidine Gluconate Cloth  6 each Topical Q0600  . cinacalcet  30 mg Oral Q supper  . collagenase   Topical Daily  . darbepoetin (ARANESP) injection - DIALYSIS  200 mcg Intravenous Q Tue-HD  . feeding supplement (NEPRO CARB STEADY)  237 mL Oral TID BM  . feeding supplement (PRO-STAT SUGAR FREE 64)  30 mL Oral TID  . heparin injection (subcutaneous)  5,000 Units Subcutaneous Q8H  . multivitamin  1 tablet Oral QHS  . nutrition supplement (JUVEN)  1 packet Oral BID BM  . polyethylene glycol  17 g Oral BID  . senna  1 tablet Oral Daily  . sevelamer carbonate  2.4 g Oral TID WC    BMET    Component Value Date/Time   NA 134 (L) 06/05/2019 0729   K 4.2 06/05/2019 0729   CL 91 (L) 06/05/2019 0729   CO2 25 06/05/2019 0729   GLUCOSE 109 (H) 06/05/2019 0729   BUN 48 (H) 06/05/2019 0729   CREATININE 9.03 (H) 06/05/2019 0729   CREATININE 9.06 (H) 08/31/2015 1041   CALCIUM 10.1 06/05/2019 0729  GFRNONAA 6 (L) 06/05/2019 0729   GFRNONAA 6 (L) 08/31/2015 1041   GFRAA 7 (L) 06/05/2019 0729   GFRAA 7 (L) 08/31/2015 1041   CBC    Component Value Date/Time   WBC 8.7 06/05/2019 0730   RBC 3.25 (L) 06/05/2019 0730   HGB 9.0 (L) 06/05/2019 0730   HCT 27.9 (L) 06/05/2019 0730   PLT 376 06/05/2019 0730   MCV 85.8 06/05/2019 0730   MCH 27.7 06/05/2019 0730   MCHC 32.3 06/05/2019 0730   RDW 18.4 (H) 06/05/2019 0730   LYMPHSABS 1.3 05/19/2019 2050   MONOABS 1.5 (H) 05/19/2019 2050   EOSABS 0.1 05/19/2019 2050   BASOSABS 0.0 05/19/2019 2050    Dialysis:TTSEast 4.5h F250 124.5kg 400/800 2/2.0Ca (low Ca here for ^Ca) Hep 12000 AVF - mircera 30 q4 last 10/13 - hect 4  Assessment/Plan:  1. ESRD- on HD TTS. Not strong enough to sit in recliner for full treatment.  2. Debility- significant weakness as well as cognitive deficits. Will need SNF placement, however  he is too weak for outpatient HD (unable to sit in recliner long enough. 3. Anemia of CKD stage 5 with ABLA s/p transfusions last month. Cont with ESA 4. H/o COVID PNA- completed treatment and off of airborne precautions 5. SHPTH with hypercalcemia- on low Ca bath 6. Seizure d/o- on keppra. 7. Encephalopathy- possible anoxic brain injury following PEA arrest.  slowly improving.  8. Sacral decub- per primary. 9. Disposition- SNF pending but needs to be strong enough to sit in a chair for HD and wheelchair between HD sessions.   Donetta Potts, MD [employer] [phone]

## 2019-06-06 NOTE — Progress Notes (Signed)
Patient states that he might want to wear CPAP tonight, but not now. instructed patient to inform staff if he wants to wear it.

## 2019-06-07 MED ORDER — MIRTAZAPINE 15 MG PO TABS
7.5000 mg | ORAL_TABLET | Freq: Every day | ORAL | Status: DC
  Administered 2019-06-07 – 2019-06-08 (×2): 7.5 mg via ORAL
  Filled 2019-06-07 (×2): qty 1

## 2019-06-07 MED ORDER — CHLORHEXIDINE GLUCONATE CLOTH 2 % EX PADS
6.0000 | MEDICATED_PAD | Freq: Every day | CUTANEOUS | Status: DC
  Administered 2019-06-07 – 2019-06-09 (×3): 6 via TOPICAL

## 2019-06-07 MED ORDER — DARBEPOETIN ALFA 200 MCG/0.4ML IJ SOSY
200.0000 ug | PREFILLED_SYRINGE | INTRAMUSCULAR | Status: DC
  Filled 2019-06-07: qty 0.4

## 2019-06-07 NOTE — Progress Notes (Signed)
Physical Therapy Treatment Patient Details Name: Troy Wells MRN: [id number] DOB: [birth year] Today's Date: 06/07/2019    History of Present Illness 53 yo male presenting to [facility] with cardiac arrest and CPR initatied by EMS. Pt at dialysis, complained of SOB, and became unresponsive. Pt with prior hospital admission (10/27 - 11/3) for COVID associated pneumonia; active problems of acute hypoxemic respiratory failure NSTEMI during admission. Intubated 11/10-11/23. MRI showing bilateral medial thalami is suspicious for anoxic injury and chronic R PICA cerebellar infarct.  Transitioned off of CRRT 05/11/19, femoral line removed 05/13/19.PMH including ESRD on HD, chronic anemia, chronic systolic heart failure, CAD, HTN, OSA on CPAP, DM2, and HLD.    PT Comments    Pt was seen for mobility with assist to sit up on side of bed, and to locate and lift with stedy.  Pt is more stable in standing with the lift, but has a struggle to control movement of mm tone to adjust posture in the chair.  Complicated by mm tone of his limbs and trunk, and with block of his knees and support of posture could do some partial stands from the chair.  Instructed him on pressure relief, and moved the side table and recliner to give lateral support and feeling of security.  Nursing was given information about all these elements of movement and stability, as well as a time to return to bed due to sacral pain of wound.   Follow Up Recommendations  SNF     Equipment Recommendations  Hospital bed;Other (comment)(hoyer)    Recommendations for Other Services Rehab consult     Precautions / Restrictions Precautions Precautions: Fall Precaution Comments: flat affect, minimal memory, seizure like shaking with movement Restrictions Weight Bearing Restrictions: No    Mobility  Bed Mobility Overal bed mobility: Needs Assistance Bed Mobility: Rolling;Supine to Sit Rolling: Min assist Sidelying to sit: Max assist;+2 for  physical assistance;+2 for safety/equipment Supine to sit: Max assist;+2 for physical assistance;+2 for safety/equipment     General bed mobility comments: max of two due to tone of shaking in limbs and trunk  Transfers Overall transfer level: Needs assistance Equipment used: Ambulation equipment used Transfers: Sit to/from Stand Sit to Stand: +2 physical assistance;+2 safety/equipment;From elevated surface;Mod assist Stand pivot transfers: Total assist       General transfer comment: stedy to get to chair  Ambulation/Gait                 Stairs             Wheelchair Mobility    Modified Rankin (Stroke Patients Only)       Balance Overall balance assessment: Needs assistance Sitting-balance support: Feet supported;Bilateral upper extremity supported Sitting balance-Leahy Scale: Poor Sitting balance - Comments: unpredictable due to tone of mm's   Standing balance support: Bilateral upper extremity supported;During functional activity Standing balance-Leahy Scale: Poor Standing balance comment: used mechanical support                            Cognition Arousal/Alertness: Awake/alert Behavior During Therapy: Flat affect Overall Cognitive Status: Impaired/Different from baseline Area of Impairment: Problem solving;Awareness;Safety/judgement;Following commands;Attention                   Current Attention Level: Selective Memory: Decreased short-term memory;Decreased recall of precautions Following Commands: Follows one step commands inconsistently;Follows one step commands with increased time Safety/Judgement: Decreased awareness of safety;Decreased awareness of deficits Awareness: Intellectual Problem Solving: Slow  processing;Requires verbal cues General Comments: pt is requiring repetitive instruction for use of arm rails to       Exercises General Exercises - Lower Extremity Ankle Circles/Pumps: AAROM;5 reps Hip  ABduction/ADduction: AAROM;5 reps Other Exercises Other Exercises: x 5 reps 2 sets of chair pushups with mod assist +2 and minimal clearance of hips    General Comments General comments (skin integrity, edema, etc.): pt is most unsteady to L side, pushed chair to bedside for lateral safety of sitting in chair on higher cushion from rehab (roho)      Pertinent Vitals/Pain Pain Assessment: Faces Faces Pain Scale: Hurts whole lot Pain Location: sacrum after being up in chair 10 minutes Pain Descriptors / Indicators: Grimacing;Guarding;Tender Pain Intervention(s): Limited activity within patient's tolerance;Monitored during session;Repositioned;Other (comment)(instructed in pressure relief)    Home Living                      Prior Function            PT Goals (current goals can now be found in the care plan section) Acute Rehab PT Goals Patient Stated Goal: to get stronger Progress towards PT goals: Progressing toward goals    Frequency    Min 3X/week      PT Plan Current plan remains appropriate    Co-evaluation PT/OT/SLP Co-Evaluation/Treatment: Yes Reason for Co-Treatment: Complexity of the patient's impairments (multi-system involvement);Necessary to address cognition/behavior during functional activity;For patient/therapist safety;To address functional/ADL transfers   OT goals addressed during session: ADL's and self-care      AM-PAC PT "6 Clicks" Mobility   Outcome Measure  Help needed turning from your back to your side while in a flat bed without using bedrails?: A Lot Help needed moving from lying on your back to sitting on the side of a flat bed without using bedrails?: A Lot Help needed moving to and from a bed to a chair (including a wheelchair)?: A Lot Help needed standing up from a chair using your arms (e.g., wheelchair or bedside chair)?: Total Help needed to walk in hospital room?: Total Help needed climbing 3-5 steps with a railing? :  Total 6 Click Score: 9    End of Session Equipment Utilized During Treatment: Gait belt Activity Tolerance: Patient tolerated treatment well;Patient limited by fatigue;Treatment limited secondary to medical complications (Comment) Patient left: in chair;with call bell/phone within reach;with chair alarm set Nurse Communication: Mobility status;Other (comment)(instruction for stedy) PT Visit Diagnosis: Other abnormalities of gait and mobility (R26.89);Muscle weakness (generalized) (M62.81);Difficulty in walking, not elsewhere classified (R26.2);Pain Pain - Right/Left: (sacrum) Pain - part of body: (sacrum)     Time: 4193-7902 PT Time Calculation (min) (ACUTE ONLY): 34 min  Charges:  $Therapeutic Activity: 8-22 mins                    Ramond Dial 06/07/2019, 5:05 PM   Mee Hives, PT MS Acute Rehab Dept. Number: [place] [facility] [place]

## 2019-06-07 NOTE — Progress Notes (Addendum)
Family Medicine Teaching Service Daily Progress Note Intern Pager: [phone]  Patient name: Troy Wells Medical record number: [id number] Date of birth: 1966-05-05 Age: 53 y.o. Gender: male  Primary Care Provider: [name], MD Consultants: Neurology, critical care, nephrology Code Status: Full   Pt Overview and Major Events to Date:  04/27/2019 admitted to ICU 05/02/2019 MRI brain showing anoxic brain injury 05/10/2019: Extubated 05/12/2019: Started receiving care under FPTS 05/20/2019: EEG w/o seizure, Neuro signed off 05/21/2019: Stable for discharge to SNF  Assessment and Plan: Troy Wells is a 53 y.o. male transferred from ICU after cardiac arrest with recent history of COVID-19 infection.  PMH significant for ESRD MWF on HD, OSA, HTN, HLD, HFpEF, pHTN, type 2 diabetes, and diabetic neuropathy.  Encephalopathy  Possible anoxic brain injury PEA cardiac arrest Dispo pending ability to maintain upright position during HD. Alert but short term memory wax and wanes.  -PT/OT consulted, appreciate recommendations -Monitor for improvement - SNF dispo  LUE/sacral ulcer Will need to demonstrate ability to sit upright for dialysis for > 3hrs prior to discharge to SNF.  -Up to chair and sit as tolerated -Continue wound care BID; hydrotherapy daily   -Tylenol PRN -Continue turning patient Q2h more on the right side due to left shoulder wound -Plastic consult   Protein calorie malnutrition  Improving slowly Has been eating 20-80% of meals over last few days, eats more with his daughter at bedside.  -Encourage PO intake -RN to document % of food eaten at each meal  -If persistent poor PO intake consider feeding tube  -Started on Mirtazapine 7.5mg  once daily today -Meals with assistance to encourage PO intake  COVIDEnterobacterHAP  Leukocytosis (Resolved) Sats 100% on RA  Patient now greater than 21 days out from Covid diagnosis. Has been cleared by  infectious disease. Repeat Covid test on 05/24/2019 is negative.  -may need repeat COVID prior to dc to SNF   ESRD on HDHypercalcemia (Resolved) Secondary hyperparathyroidism Per nephro note on 12/19: No strong enough to sit in recliner for full treatment. Encouraged RN today to call HD unit tomorrow and encourage pt to sit in chair. Dialysis per nephro TTS schedule Labs on 12/19: Cr 9 BUN 48, Na 134, K 4.2, Ca 10.1, Phosp 7 -Renal function panel three times a week -CBC once weekly -Nephrology following, appreciate recs -Aranesp every Tuesday with HD -Senispar 30mg  daily, renvela 2.4g TID -Oxycodone prior to HD and during HD  Constipation-treating  Last BM 12/20. Incontinent.    -Prescribed Miralax BID and Dulcolax  -Added Senna today   HFmEF CAD [facility] 04/27/19 40 to 45%.  LV with mild to moderate decreased fxn,mildly increased LVH,inferiolateral akinesis and anterolateral severe hypokinesis. Grade 2 diastolic dysfunction.  -Continue aspirin 81 -Continue Lipitor 80 mg daily -Coreg 12.5 mg twice daily  Hypertension  Tachycardia, stable  BP 683M systolic, HR 78 -Continue Coreg 12.5 mg twice daily   OSA with use of CPAP OSA at baseline and uses CPAP intermittently. Now 21+ days out from Covid diagnosis, can use CPAP if needed. Refused CPAP ON.  -Continue to encourage patient to use CPAP at bedtime  Diabetes CBG 86 on 12/16, no documented CBGs for today. Glu on 12/17 RFP 113 Home medications include glipizide 5 mg BID.Last A1c 5.8 on 04/14/2019.  -Continue to hold home glipizide -would consider discontinuing glipizide on dc to avoid hypoglycemia  HLD Home medications include atorvastatin 40mg  -Atorvastatin 80mg  daily  Diabetic Neuropathy  Patient on home gabapentin 300 mg TID. Patient reports  only taking BID.  -continue to hold homegabapentin  Gout  Home medications: Allopurinol 100mg  daily and colchicine 0.6mg  BID as needed when  symptomatic. -Continue home allopurinol  Social/Disposition Bed was available at [facility] 12/9, patient still needs improvement before discharge -Discharge to SNF when strength and nutrition improve  FEN/GI: Dysphagia 3 diet XGZ:FPOIPPG 5000 units subq 3 times daily  Disposition: pending ability to tolerate sitting at HD  Subjective:  Doing well today, no concerns. I have noticed small improvements in his interactions over the past few weeks.   Objective: Temp:  [98.2 F (36.8 C)-98.3 F (36.8 C)] 98.2 F (36.8 C) (12/20 2301) Pulse Rate:  [78-83] 78 (12/20 2301) Resp:  [16-18] 18 (12/20 2301) BP: (116-130)/(78-80) 121/78 (12/20 2301) SpO2:  [98 %-100 %] 98 % (12/20 2301)  General: Alert and cooperative and appears to be in no acute distress Cardio: Normal S1 and S2, RRR. No murmurs or rubs.   Pulm: CTAB, no crackles, wheezing, or diminished breath sounds. Normal respiratory effort Abdomen: Bowel sounds normal. Abdomen soft and non-tender.  Extremities: No peripheral edema. Warm/ well perfused.   Neuro: Cranial nerves grossly intact, ANO X 2   Laboratory: Recent Labs  Lab 06/01/19 0244 06/03/19 1350 06/05/19 0730  WBC 10.5 12.5* 8.7  HGB 9.4* 9.1* 9.0*  HCT 28.9* 28.6* 27.9*  PLT 466* 438* 376   Recent Labs  Lab 06/02/19 0211 06/03/19 0334 06/05/19 0729  NA 134* 134* 134*  K 4.2 4.7 4.2  CL 94* 93* 91*  CO2 26 23 25   BUN 38* 56* 48*  CREATININE 6.98* 9.44* 9.03*  CALCIUM 9.8 10.3 10.1  GLUCOSE 108* 113* 109*    Imaging/Diagnostic Tests: No new images   Lattie Haw, MD 06/07/2019, 7:00 AM PGY-1, [place] Intern pager: [phone], text pages welcome

## 2019-06-07 NOTE — Consult Note (Signed)
@LOGO @  Reason for Consult: sacral wound Referring Physician: Dr. Dorris Singh  Troy Wells is an 53 y.o. male. HPI: Sacral ulcer ~3+ weeks after being bed bound in ICU after cardiac arrest with recent history of COVID-19 infection. Currently being treated with wound care twice daily and hydrotherapy daily.  Patient reports area is extremely painful so he can't sit straight or lay on his back. Bed has air matress. Turning patient Q2h. Ulcer is current barrier to discharge to SNF as patient must be able to sit upright for dialysis for > 3 hrs.  Patient noted for protein calorie malnutrition. Eating 20-80% of meals over last few days. RN monitoring and feeding assistance provided to encourage PO intake.   Started on Mirtazapine 7.5 mg once daily today.  Past Medical History:  Diagnosis Date  . CARDIAC ARREST 11/01/2009   Qualifier: Diagnosis of  By: Selena Batten CMA, Jew[place]    . Colon polyps 01/02/2016   Colonoscopy July 2017 - One 3 mm polyp in the transverse colon, removed with a cold biopsy forceps. Resected and retrieved. - One 3 mm polyp in the rectum, removed with a cold biopsy forceps. Resected and retrieved. - Diverticulosis in the entire examined colon. - Non-bleeding internal hemorrhoids. - The examination was otherwise normal. - Significant looping which prolonged cecal    . Coronary artery disease 08/15/2009   with AMI  . Dialysis patient ([place]) 09/27/2014   mon, wed, and fri  . Diverticulosis of colon without hemorrhage 01/02/2016   Colonoscopy July 2017 - One 3 mm polyp in the transverse colon, removed with a cold biopsy forceps. Resected and retrieved. - One 3 mm polyp in the rectum, removed with a cold biopsy forceps. Resected and retrieved. - Diverticulosis in the entire examined colon. - Non-bleeding internal hemorrhoids. - The examination was otherwise normal. - Significant looping which prolonged cecal    . DM (diabetes mellitus), type 2 with renal complications  ([place]) 14/48/1856  . Essential hypertension 11/01/2009   Qualifier: Diagnosis of  By: Selena Batten CMA, Jewel    . Hyperlipidemia   . Hypertension   . Mitral valve regurgitation 09/19/2015   Echo 09/2015   . OSA treated with BiPAP 06/17/2010  . Sleep apnea    cpap nightly    Past Surgical History:  Procedure Laterality Date  . AV FISTULA PLACEMENT Left 09/27/2014  . INSERTION OF ARTERIOVENOUS (AV) ARTEGRAFT ARM Left 04/02/2018   Procedure: INSERTION OF ARTERIOVENOUS (AV) ARTEGRAFT ARM LEFT UPPER ARM;  Surgeon: Waynetta Sandy, MD;  Location: [facility];  Service: Vascular;  Laterality: Left;  . INSERTION OF DIALYSIS CATHETER N/A 04/02/2018   Procedure: INSERTION OF DIALYSIS CATHETER, right internal jugular;  Surgeon: Waynetta Sandy, MD;  Location: [place];  Service: Vascular;  Laterality: N/A;  . NECK SURGERY     C6 & C7 replaced 30 yrs ago per pt  . REVISON OF ARTERIOVENOUS FISTULA Left 04/02/2018   Procedure: REVISION PLICATION OF ARTERIOVENOUS FISTULA ARM;  Surgeon: Waynetta Sandy, MD;  Location: [place];  Service: Vascular;  Laterality: Left;  . WISDOM TOOTH EXTRACTION      Family History  Problem Relation Age of Onset  . Hypertension Mother   . Heart disease Mother 28       cause of death  . Hypertension Father   . Aneurysm Father 45       brain aneurysm  . Heart disease Father 36       cause of death  . Hypertension Brother   .  Alcohol abuse Brother   . Diabetes Brother   . Pancreatitis Brother   . Hypertension Brother   . Colon cancer Neg Hx     Social History:  reports that he quit smoking about 7 years ago. His smoking use included cigars. He has a 10.00 pack-year smoking history. He has never used smokeless tobacco. He reports current alcohol use of about 1.0 standard drinks of alcohol per week. He reports that he does not use drugs.  Allergies: No Known Allergies  Medications: I have reviewed the patient's current medications.  No results  found for this or any previous visit (from the past 48 hour(s)).  No results found.  Review of Systems  Constitutional: Negative for chills and fever.  Cardiovascular: Negative for chest pain.  Gastrointestinal: Negative for nausea and vomiting.  Skin: Negative for itching and rash.       Sacral wound, extremely painful, prevents patient for sittling upright or lying on his back  Neurological: Negative for headaches.   Blood pressure 123/84, pulse 77, temperature 98.1 F (36.7 C), temperature source Oral, resp. rate 16, height 5\' 10"  (1.778 m), weight 91.6 kg, SpO2 99 %. Physical Exam  Constitutional: He is oriented to person, place, and time. He appears well-developed.  HENT:  Head: Normocephalic and atraumatic.  Eyes: EOM are normal.  Respiratory: Effort normal.  Neurological: He is alert and oriented to person, place, and time.  Skin: Skin is warm and dry.     significant sacral ulcer present, no drainage or bleeding, no signs of infection. Patient isn't able to sit straight or lay on his back (due to painfulness of ulcer)   Psychiatric: He has a normal mood and affect. His behavior is normal. Thought content normal.    Assessment/Plan: Troy Wells has a well established sacral ulcer that is very painful for him. It prevents him from sitting upright and lying on his back. At this time its a barrier to discharge to a SNF as he needs to be able to sit upright for > 3 hrs for dialysis. Location of ulcer would make seal needed for wound vac difficult.  Recommend continued use of air mattress. Continue with frequent turning of patient. Continue to strive to optimize nutritional status for wound healing including high protein intake. Protein shakes may be beneficial. Continue with current wound care. Will discuss with plastics team to determine any additional options for treatment.  Pictures were obtained of the patient and placed in the chart with the patient's or guardian's  permission.   Threasa Heads, PA-C 06/07/2019, 2:48 PM

## 2019-06-07 NOTE — Progress Notes (Signed)
Pt states that he does wear CPAP at home.  Pt also states he has not been wearing CPAP while in the hospital.  PT instructed to have RN call RT if he changes his mind and want to wear CPAP tonight

## 2019-06-07 NOTE — Progress Notes (Addendum)
Pt was able to tolerate sitting in recliner for 4 hours today.

## 2019-06-07 NOTE — Progress Notes (Signed)
FPTS Interim Progress Note  Spoke to plastic surgery, Dr. Marla Roe, regarding sacral wound. Given patient has had minimal success with hydrotherapy and poor site for wound vac will want plastics to consult to see if there is anything they can do to help promote wound healing.   Dr. Marla Roe stated she will see the patient.   Caroline More, DO 06/07/2019, 12:32 PM PGY-3, [place] Medicine Service pager [phone]

## 2019-06-07 NOTE — Progress Notes (Signed)
[facility] ROUNDING NOTE   Subjective:   53 year old gentleman end-stage renal disease Monday Wednesday Friday dialysis obstructive sleep apnea hypertension hyperlipidemia diastolic heart failure history of diabetes type 2.  He was admitted to [facility] 04/27/2019 with Covid pneumonia.  MRI 0254 showed anoxic brain injury.  He was extubated 05/10/2019 he is waiting discharge to SNF.  His most recent Covid test was - 05/24/2019  He has a sacral decubitus and protein calorie malnutrition.  He lacks strength to sit in a recliner for his full dialysis treatment.  This seems to be a limiting factor for discharge.  Tolerated dialysis 06/05/2019 with 1 L removed.  Blood pressure 123/84 pulse 77 temperature 98.1 O2 sats 99% room air  Sodium 134 potassium 4.2 chloride 91 CO2 25 glucose 109 CO2 48 creatinine 9 calcium 10.1 phosphorus 7.0 albumin 2.6  WBC 8.7 hemoglobin 9.0 platelets 376  Aspirin 81 mg daily atorvastatin 80 mg daily, carvedilol 6.25 mg twice daily, darbepoetin 200 mcg every Thursday last dose was 06/03/2019.,  Multivitamins 1 daily, Savella Mia 2.4 g 3 times daily  Objective:  Vital signs in last 24 hours:  Temp:  [98.1 F (36.7 C)-98.3 F (36.8 C)] 98.1 F (36.7 C) (12/21 0748) Pulse Rate:  [77-83] 77 (12/21 0748) Resp:  [16-18] 16 (12/21 0748) BP: (116-123)/(78-84) 123/84 (12/21 0748) SpO2:  [98 %-99 %] 99 % (12/21 0748)  Weight change:  Filed Weights   06/04/19 0419 06/05/19 0647 06/05/19 1056  Weight: 92.5 kg 93 kg 91.6 kg    Intake/Output: I/O last 3 completed shifts: In: 480 [P.O.:480] Out: -    Intake/Output this shift:  No intake/output data recorded.  Gen: NAD CVS: no rub Resp: cta Abd: +BS, soft, nt/nd Ext: no edema, LAVF +T/B   Basic Metabolic Panel: Recent Labs  Lab 06/01/19 0244 06/01/19 0835 06/02/19 0211 06/03/19 0334 06/05/19 0729  NA 130* 133* 134* 134* 134*  K 6.5* 4.8 4.2 4.7 4.2  CL 90* 93* 94* 93* 91*   CO2 23 24 26 23 25   GLUCOSE 100* 97 108* 113* 109*  BUN 78* 77* 38* 56* 48*  CREATININE 10.29* 10.56* 6.98* 9.44* 9.03*  CALCIUM 9.5 9.8 9.8 10.3 10.1  PHOS 6.5* 6.6* 5.5* 7.4* 7.0*    Liver Function Tests: Recent Labs  Lab 06/01/19 0244 06/01/19 0835 06/02/19 0211 06/03/19 0334 06/05/19 0729  ALBUMIN 2.5* 2.5* 2.7* 2.6* 2.6*   No results for input(s): LIPASE, AMYLASE in the last 168 hours. No results for input(s): AMMONIA in the last 168 hours.  CBC: Recent Labs  Lab 06/01/19 0244 06/03/19 1350 06/05/19 0730  WBC 10.5 12.5* 8.7  HGB 9.4* 9.1* 9.0*  HCT 28.9* 28.6* 27.9*  MCV 84.8 86.4 85.8  PLT 466* 438* 376    Cardiac Enzymes: No results for input(s): CKTOTAL, CKMB, CKMBINDEX, TROPONINI in the last 168 hours.  BNP: Invalid input(s): POCBNP  CBG: Recent Labs  Lab 06/01/19 0629  GLUCAP 30    Microbiology: Results for orders placed or performed during the hospital encounter of 04/27/19  Culture, blood (routine x 2)     Status: None   Collection Time: 04/27/19  1:07 PM   Specimen: BLOOD  Result Value Ref Range Status   Specimen Description BLOOD BLOOD LEFT HAND  Final   Special Requests   Final    BOTTLES DRAWN AEROBIC AND ANAEROBIC Blood Culture adequate volume   Culture   Final    NO GROWTH 5 DAYS Performed at [facility], [place] [address], [place], [place] [address]    Report Status 05/02/2019 FINAL  Final  Culture, blood (routine x 2)     Status: None   Collection Time: 04/27/19  5:08 PM   Specimen: BLOOD  Result Value Ref Range Status   Specimen Description BLOOD RIGHT ANTECUBITAL  Final   Special Requests   Final    BOTTLES DRAWN AEROBIC AND ANAEROBIC Blood Culture adequate volume   Culture   Final    NO GROWTH 5 DAYS Performed at [facility], [place] [address] [place], [place] [address]    Report Status 05/02/2019 FINAL  Final  SARS CORONAVIRUS 2 (TAT 6-24 HRS) Nasopharyngeal Nasopharyngeal Swab     Status: Abnormal    Collection Time: 05/05/19  9:45 AM   Specimen: Nasopharyngeal Swab  Result Value Ref Range Status   SARS Coronavirus 2 POSITIVE (A) NEGATIVE Final    Comment: RESULT CALLED TO, READ BACK BY AND VERIFIED WITH: Geroge Baseman RN 16:45 05/05/19 ([email or web address]) (NOTE) SARS-CoV-2 target nucleic acids are DETECTED. The SARS-CoV-2 RNA is generally detectable in upper and lower respiratory specimens during the acute phase of infection. Positive results are indicative of active infection with SARS-CoV-2. Clinical  correlation with patient history and other diagnostic information is necessary to determine patient infection status. Positive results do  not rule out bacterial infection or co-infection with other viruses. The expected result is Negative. Fact Sheet for Patients: SugarRoll.be Fact Sheet for Healthcare Providers: https://www.woods-mathews.com/ This test is not yet approved or cleared by the [place] FDA and  has been authorized for detection and/or diagnosis of SARS-CoV-2 by FDA under an Emergency Use Authorization (EUA). This EUA will remain  in effect (meaning this test can be used)  for the duration of the COVID-19 declaration under Section 564(b)(1) of the Act, 21 U.S.C. section 360bbb-3(b)(1), unless the authorization is terminated or revoked sooner. Performed at [facility], [place] [address], [place], [place] [address]   Culture, respiratory (non-expectorated)     Status: None   Collection Time: 05/05/19 12:35 PM   Specimen: Tracheal Aspirate; Respiratory  Result Value Ref Range Status   Specimen Description TRACHEAL ASPIRATE  Final   Special Requests NONE  Final   Gram Stain   Final    RARE WBC PRESENT, PREDOMINANTLY PMN ABUNDANT GRAM POSITIVE RODS RARE GRAM NEGATIVE RODS Performed at [facility], [place] [address] [place], [place] [address]    Culture FEW ENTEROBACTER CLOACAE  Final   Report Status 05/07/2019 FINAL   Final   Organism ID, Bacteria ENTEROBACTER CLOACAE  Final      Susceptibility   Enterobacter cloacae - MIC*    CEFAZOLIN >=64 RESISTANT Resistant     CEFEPIME <=1 SENSITIVE Sensitive     CEFTAZIDIME <=1 SENSITIVE Sensitive     CEFTRIAXONE <=1 SENSITIVE Sensitive     CIPROFLOXACIN <=0.25 SENSITIVE Sensitive     GENTAMICIN <=1 SENSITIVE Sensitive     IMIPENEM <=0.25 SENSITIVE Sensitive     TRIMETH/SULFA <=20 SENSITIVE Sensitive     PIP/TAZO <=4 SENSITIVE Sensitive     * FEW ENTEROBACTER CLOACAE  Culture, blood (routine x 2)     Status: None   Collection Time: 05/19/19 12:00 PM   Specimen: BLOOD RIGHT HAND  Result Value Ref Range Status   Specimen Description BLOOD RIGHT HAND  Final   Special Requests   Final    BOTTLES DRAWN AEROBIC ONLY Blood Culture adequate volume   Culture  Final    NO GROWTH 5 DAYS Performed at [facility], [place] [address], [place], [place] [address]    Report Status 05/24/2019 FINAL  Final  Culture, blood (routine x 2)     Status: None   Collection Time: 05/19/19 12:05 PM   Specimen: BLOOD RIGHT HAND  Result Value Ref Range Status   Specimen Description BLOOD RIGHT HAND  Final   Special Requests   Final    BOTTLES DRAWN AEROBIC ONLY Blood Culture results may not be optimal due to an inadequate volume of blood received in culture bottles   Culture   Final    NO GROWTH 5 DAYS Performed at [facility], [place] [address], [place], [place] [address]    Report Status 05/24/2019 FINAL  Final  SARS CORONAVIRUS 2 (TAT 6-24 HRS) Nasopharyngeal Nasopharyngeal Swab     Status: None   Collection Time: 05/21/19 12:56 PM   Specimen: Nasopharyngeal Swab  Result Value Ref Range Status   SARS Coronavirus 2 NEGATIVE NEGATIVE Final    Comment: (NOTE) SARS-CoV-2 target nucleic acids are NOT DETECTED. The SARS-CoV-2 RNA is generally detectable in upper and lower respiratory specimens during the acute phase of infection. Negative results do not preclude  SARS-CoV-2 infection, do not rule out co-infections with other pathogens, and should not be used as the sole basis for treatment or other patient management decisions. Negative results must be combined with clinical observations, patient history, and epidemiological information. The expected result is Negative. Fact Sheet for Patients: SugarRoll.be Fact Sheet for Healthcare Providers: https://www.woods-mathews.com/ This test is not yet approved or cleared by the [place] FDA and  has been authorized for detection and/or diagnosis of SARS-CoV-2 by FDA under an Emergency Use Authorization (EUA). This EUA will remain  in effect (meaning this test can be used) for the duration of the COVID-19 declaration under Section 56 4(b)(1) of the Act, 21 U.S.C. section 360bbb-3(b)(1), unless the authorization is terminated or revoked sooner. Performed at [facility], [place] [address] [place], [place] [address]   SARS CORONAVIRUS 2 (TAT 6-24 HRS) Nasopharyngeal Nasopharyngeal Swab     Status: None   Collection Time: 05/24/19 11:15 AM   Specimen: Nasopharyngeal Swab  Result Value Ref Range Status   SARS Coronavirus 2 NEGATIVE NEGATIVE Final    Comment: (NOTE) SARS-CoV-2 target nucleic acids are NOT DETECTED. The SARS-CoV-2 RNA is generally detectable in upper and lower respiratory specimens during the acute phase of infection. Negative results do not preclude SARS-CoV-2 infection, do not rule out co-infections with other pathogens, and should not be used as the sole basis for treatment or other patient management decisions. Negative results must be combined with clinical observations, patient history, and epidemiological information. The expected result is Negative. Fact Sheet for Patients: SugarRoll.be Fact Sheet for Healthcare Providers: https://www.woods-mathews.com/ This test is not yet approved or  cleared by the [place] FDA and  has been authorized for detection and/or diagnosis of SARS-CoV-2 by FDA under an Emergency Use Authorization (EUA). This EUA will remain  in effect (meaning this test can be used) for the duration of the COVID-19 declaration under Section 56 4(b)(1) of the Act, 21 U.S.C. section 360bbb-3(b)(1), unless the authorization is terminated or revoked sooner. Performed at [facility], [place] [address] [place], [place] [address]     Coagulation Studies: No results for input(s): LABPROT, INR in the last 72 hours.  Urinalysis: No results for input(s): COLORURINE, LABSPEC, [place], [place], [place],  BILIRUBINUR, KETONESUR, PROTEINUR, UROBILINOGEN, NITRITE, LEUKOCYTESUR in the last 72 hours.  Invalid input(s): APPERANCEUR    Imaging: No results found.   Medications:    . acetaminophen  650 mg Oral Q4H  . allopurinol  100 mg Oral Daily  . aspirin  81 mg Oral Daily  . atorvastatin  80 mg Oral Daily  . carvedilol  6.25 mg Oral BID WC  . Chlorhexidine Gluconate Cloth  6 each Topical Q0600  . cinacalcet  30 mg Oral Q supper  . collagenase   Topical Daily  . [START ON 06/10/2019] darbepoetin (ARANESP) injection - DIALYSIS  200 mcg Intravenous Q Thu-HD  . feeding supplement (NEPRO CARB STEADY)  237 mL Oral TID BM  . feeding supplement (PRO-STAT SUGAR FREE 64)  30 mL Oral TID  . heparin injection (subcutaneous)  5,000 Units Subcutaneous Q8H  . multivitamin  1 tablet Oral QHS  . nutrition supplement (JUVEN)  1 packet Oral BID BM  . polyethylene glycol  17 g Oral BID  . senna  1 tablet Oral Daily  . sevelamer carbonate  2.4 g Oral TID WC   heparin, morphine injection, oxyCODONE  Assessment/ Plan:  1. ESRD- on HD TTS.  Not strong enough to sit in recliner for full treatment.    Last dialysis treatment 06/05/2019 with 1 L removed.  Next dialysis treatment be 06/08/2019. 2. Debility- significant weakness as well as cognitive deficits.  Will need SNF  placement, however he is too weak for outpatient HD (unable to sit in recliner long enough. 3. Anemia of CKD stage 5 with ABLA s/p transfusions last month.  Cont with ESA 4. H/o COVID PNA- completed treatment and off of airborne precautions 5. SHPTH with hypercalcemia- on low Ca bath.  Patient on noncalcium based binders 6. Sacral decub- per primary. 7. Encephalopathy/possible anoxic brain injury/PEA cardiac arrest.  Short-term memory loss requires SNF disposition 8. Diabetes as per primary service   LOS: [place] @TODAY @9 :6 AM

## 2019-06-07 NOTE — Progress Notes (Signed)
Occupational Therapy Treatment Patient Details Name: Troy Wells MRN: [id number] DOB: 1966-03-24 Today's Date: 06/07/2019    History of present illness 53 yo male presenting to [facility] with cardiac arrest and CPR initatied by EMS. Pt at dialysis, complained of SOB, and became unresponsive. Pt with prior hospital admission (10/27 - 11/3) for COVID associated pneumonia; active problems of acute hypoxemic respiratory failure NSTEMI during admission. Intubated 11/10-11/23. MRI showing bilateral medial thalami is suspicious for anoxic injury and chronic R PICA cerebellar infarct.  Transitioned off of CRRT 05/11/19, femoral line removed 05/13/19.PMH including ESRD on HD, chronic anemia, chronic systolic heart failure, CAD, HTN, OSA on CPAP, DM2, and HLD.   OT comments  Patient sidelying in bed and agreeable to OT/PT session.  Patient completing bed mobility with max assist +2, transfers using stedy to recliner with ROHO cushion with max assist +2.  From recliner, engaged in BUE strengthening with mod assist +2 to complete chair pushups and min guard to min assist for lateral lean weightshifting techniques.  Educated on pressure relief, but still needs hands on assist to complete. Informed RN of pt sitting in recliner, technique to return to bed after 30 min-1 hour.  Will follow acutely.     Follow Up Recommendations  SNF;Supervision/Assistance - 24 hour    Equipment Recommendations  Wheelchair (measurements OT);Wheelchair cushion (measurements OT)(ROHO cushion)    Recommendations for Other Services      Precautions / Restrictions Precautions Precautions: Fall Precaution Comments: flat affect, minimal memory, seizure like shaking with movement Restrictions Weight Bearing Restrictions: No       Mobility Bed Mobility Overal bed mobility: Needs Assistance Bed Mobility: Rolling;Sidelying to Sit Rolling: Min assist Sidelying to sit: Max assist;+2 for physical assistance       General bed  mobility comments: min assist to roll from L side to R, transitioned to EOB with support of trunk and LEs   Transfers Overall transfer level: Needs assistance Equipment used: Ambulation equipment used Transfers: Sit to/from [facility] Sit to Stand: Max assist;+2 physical assistance;From elevated surface Stand pivot transfers: Total assist       General transfer comment: max assist +2 to power up in stedy, pivoting to recliner in stedy    Balance Overall balance assessment: Needs assistance Sitting-balance support: Bilateral upper extremity supported Sitting balance-Leahy Scale: Poor Sitting balance - Comments: min guard to min assist to maintain static sitting balance unsupported   Standing balance support: Bilateral upper extremity supported;During functional activity Standing balance-Leahy Scale: Poor Standing balance comment: relaint on BUE and external support in stedy                            ADL either performed or assessed with clinical judgement   ADL Overall ADL's : Needs assistance/impaired                         Toilet Transfer: Total assistance;+2 for physical assistance Toilet Transfer Details (indicate cue type and reason): simulated to chair with stedy         Functional mobility during ADLs: Maximal assistance;+2 for physical assistance(sit <>stand ) General ADL Comments: pt declined further ADL engagement      Vision       Perception     Praxis      Cognition Arousal/Alertness: Awake/alert Behavior During Therapy: Flat affect Overall Cognitive Status: Impaired/Different from baseline Area of Impairment: Attention;Memory;Following commands;Safety/judgement;Awareness;Problem solving  Current Attention Level: Sustained Memory: Decreased short-term memory Following Commands: Follows one step commands inconsistently;Follows one step commands with increased time Safety/Judgement:  Decreased awareness of deficits;Decreased awareness of safety Awareness: Intellectual Problem Solving: Slow processing;Decreased initiation;Difficulty sequencing;Requires verbal cues;Requires tactile cues General Comments: able to follow most commands with increased time, poor problem solving, sequencing and awareness to deficits         Exercises Exercises: Other exercises Other Exercises Other Exercises: x 5 reps 2 sets of chair pushups with mod assist +2 and minimal clearance of hips   Shoulder Instructions       General Comments pt continues to have uncontrolled shaking motions with all movement; patient educated on safe controlled weightshifting techniques in supported sitting in chair     Pertinent Vitals/ Pain       Pain Assessment: Faces Faces Pain Scale: Hurts even more Pain Location: low back and sacrum when pressure applied to that area in sitting Pain Descriptors / Indicators: Grimacing;Guarding;Pressure Pain Intervention(s): Limited activity within patient's tolerance;Monitored during session;Repositioned  Home Living                                          Prior Functioning/Environment              Frequency  Min 2X/week        Progress Toward Goals  OT Goals(current goals can now be found in the care plan section)  Progress towards OT goals: Progressing toward goals  Acute Rehab OT Goals Patient Stated Goal: to get stronger OT Goal Formulation: With patient  Plan Discharge plan remains appropriate    Co-evaluation    PT/OT/SLP Co-Evaluation/Treatment: Yes Reason for Co-Treatment: Complexity of the patient's impairments (multi-system involvement);For patient/therapist safety;To address functional/ADL transfers   OT goals addressed during session: ADL's and self-care      AM-PAC OT "6 Clicks" Daily Activity     Outcome Measure   Help from another person eating meals?: A Lot Help from another person taking care of personal  grooming?: A Little Help from another person toileting, which includes using toliet, bedpan, or urinal?: A Lot Help from another person bathing (including washing, rinsing, drying)?: A Lot Help from another person to put on and taking off regular upper body clothing?: A Little Help from another person to put on and taking off regular lower body clothing?: A Lot 6 Click Score: 14    End of Session Equipment Utilized During Treatment: Oxygen  OT Visit Diagnosis: Unsteadiness on feet (R26.81);Other abnormalities of gait and mobility (R26.89);Muscle weakness (generalized) (M62.81)   Activity Tolerance Patient limited by pain   Patient Left in chair;with call bell/phone within reach;with chair alarm set   Nurse Communication Mobility status;Other (comment)(use ROHO cushion in recliner)        Time: 3646-8032 OT Time Calculation (min): 32 min  Charges: OT General Charges $OT Visit: 1 Visit OT Treatments $Self Care/Home Management : 8-22 mins  Jolaine Artist, OT [place] Pager [phone] Office [phone]    Delight Stare 06/07/2019, 2:54 PM

## 2019-06-08 DIAGNOSIS — R69 Illness, unspecified: Secondary | ICD-10-CM

## 2019-06-08 DIAGNOSIS — Z9911 Dependence on respirator [ventilator] status: Secondary | ICD-10-CM

## 2019-06-08 LAB — CBC
HCT: 31.3 % — ABNORMAL LOW (ref 39.0–52.0)
Hemoglobin: 10.5 g/dL — ABNORMAL LOW (ref 13.0–17.0)
MCH: 27.5 pg (ref 26.0–34.0)
MCHC: 33.5 g/dL (ref 30.0–36.0)
MCV: 81.9 fL (ref 80.0–100.0)
Platelets: 389 10*3/uL (ref 150–400)
RBC: 3.82 MIL/uL — ABNORMAL LOW (ref 4.22–5.81)
RDW: 18.2 % — ABNORMAL HIGH (ref 11.5–15.5)
WBC: 10.1 10*3/uL (ref 4.0–10.5)
nRBC: 0.3 % — ABNORMAL HIGH (ref 0.0–0.2)

## 2019-06-08 LAB — RENAL FUNCTION PANEL
Albumin: 2.8 g/dL — ABNORMAL LOW (ref 3.5–5.0)
Anion gap: 19 — ABNORMAL HIGH (ref 5–15)
BUN: 76 mg/dL — ABNORMAL HIGH (ref 6–20)
CO2: 24 mmol/L (ref 22–32)
Calcium: 10.7 mg/dL — ABNORMAL HIGH (ref 8.9–10.3)
Chloride: 90 mmol/L — ABNORMAL LOW (ref 98–111)
Creatinine, Ser: 11.11 mg/dL — ABNORMAL HIGH (ref 0.61–1.24)
GFR calc Af Amer: 5 mL/min — ABNORMAL LOW (ref >60)
GFR calc non Af Amer: 5 mL/min — ABNORMAL LOW (ref >60)
Glucose, Bld: 98 mg/dL (ref 70–99)
Phosphorus: 6.1 mg/dL — ABNORMAL HIGH (ref 2.5–4.6)
Potassium: 5 mmol/L (ref 3.5–5.1)
Sodium: 133 mmol/L — ABNORMAL LOW (ref 135–145)

## 2019-06-08 MED ORDER — SODIUM CHLORIDE 0.9 % IV SOLN: 100.0000 mL | INTRAVENOUS | Status: DC | PRN

## 2019-06-08 MED ORDER — DARBEPOETIN ALFA 200 MCG/0.4ML IJ SOSY
PREFILLED_SYRINGE | INTRAMUSCULAR | Status: AC
  Filled 2019-06-08: qty 0.4

## 2019-06-08 MED ORDER — LIDOCAINE-PRILOCAINE 2.5-2.5 % EX CREA: 1.0000 "application " | TOPICAL_CREAM | CUTANEOUS | Status: DC | PRN

## 2019-06-08 MED ORDER — PENTAFLUOROPROP-TETRAFLUOROETH EX AERO: 1.0000 "application " | INHALATION_SPRAY | CUTANEOUS | Status: DC | PRN

## 2019-06-08 MED ORDER — HEPARIN SODIUM (PORCINE) 1000 UNIT/ML DIALYSIS: 1000.0000 [IU] | INTRAMUSCULAR | Status: DC | PRN

## 2019-06-08 MED ORDER — LIDOCAINE HCL (PF) 1 % IJ SOLN: 5.0000 mL | INTRAMUSCULAR | Status: DC | PRN

## 2019-06-08 MED ORDER — HEPARIN SODIUM (PORCINE) 1000 UNIT/ML DIALYSIS: 8000.0000 [IU] | Freq: Once | INTRAMUSCULAR | Status: DC

## 2019-06-08 MED ORDER — ALTEPLASE 2 MG IJ SOLR: 2.0000 mg | Freq: Once | INTRAMUSCULAR | Status: DC | PRN

## 2019-06-08 MED ORDER — HEPARIN SODIUM (PORCINE) 1000 UNIT/ML DIALYSIS: 10000.0000 [IU] | Freq: Once | INTRAMUSCULAR | Status: DC

## 2019-06-08 MED ORDER — HEPARIN SODIUM (PORCINE) 1000 UNIT/ML DIALYSIS: 20.0000 [IU]/kg | INTRAMUSCULAR | Status: DC | PRN

## 2019-06-08 NOTE — Progress Notes (Signed)
[facility] ROUNDING NOTE   Subjective:   53 year old gentleman end-stage renal disease Monday Wednesday Friday dialysis obstructive sleep apnea hypertension hyperlipidemia diastolic heart failure history of diabetes type 2.  He was admitted to [facility] 04/27/2019 with Covid pneumonia.  MRI 3976 showed anoxic brain injury.  He was extubated 05/10/2019 he is waiting discharge to SNF.  His most recent Covid test was - 05/24/2019  He has a sacral decubitus and protein calorie malnutrition.  He lacks strength to sit in a recliner for his full dialysis treatment.  This seems to be a limiting factor for discharge.  Tolerated dialysis 06/05/2019 with 1 L removed.  Blood pressure 137/78 pulse 94 temperature 97.3 O2 sats 84%  Sodium 133 potassium 5.0 chloride 90 CO2 24 BUN 76 creatinine 11.1 glucose 98 calcium 10.7 phosphorus 6.1 albumin 2.8 WBC 10.1 hemoglobin 10.5 platelets 289   Aspirin 81 mg daily atorvastatin 80 mg daily, carvedilol 6.25 mg twice daily, darbepoetin 200 mcg every Thursday last dose was 06/03/2019.,  Multivitamins 1 daily, Savella Mia 2.4 g 3 times daily  Objective:  Vital signs in last 24 hours:  Temp:  [97.3 F (36.3 C)-97.9 F (36.6 C)] 97.3 F (36.3 C) (12/22 0827) Pulse Rate:  [74-85] 74 (12/22 0827) Resp:  [17-20] 17 (12/22 0827) BP: (80-137)/(54-83) 137/78 (12/22 0827) SpO2:  [84 %-99 %] 84 % (12/22 0827)  Weight change:  Filed Weights   06/04/19 0419 06/05/19 0647 06/05/19 1056  Weight: 92.5 kg 93 kg 91.6 kg    Intake/Output: No intake/output data recorded.   Intake/Output this shift:  No intake/output data recorded.  Gen: NAD CVS: no rub Resp: cta Abd: +BS, soft, nt/nd Ext: no edema, LAVF +T/B   Basic Metabolic Panel: Recent Labs  Lab 06/02/19 0211 06/03/19 0334 06/05/19 0729 06/08/19 0644  NA 134* 134* 134* 133*  K 4.2 4.7 4.2 5.0  CL 94* 93* 91* 90*  CO2 26 23 25 24   GLUCOSE 108* 113* 109* 98  BUN 38* 56* 48*  76*  CREATININE 6.98* 9.44* 9.03* 11.11*  CALCIUM 9.8 10.3 10.1 10.7*  PHOS 5.5* 7.4* 7.0* 6.1*    Liver Function Tests: Recent Labs  Lab 06/02/19 0211 06/03/19 0334 06/05/19 0729 06/08/19 0644  ALBUMIN 2.7* 2.6* 2.6* 2.8*   No results for input(s): LIPASE, AMYLASE in the last 168 hours. No results for input(s): AMMONIA in the last 168 hours.  CBC: Recent Labs  Lab 06/03/19 1350 06/05/19 0730 06/08/19 0010  WBC 12.5* 8.7 10.1  HGB 9.1* 9.0* 10.5*  HCT 28.6* 27.9* 31.3*  MCV 86.4 85.8 81.9  PLT 438* 376 389    Cardiac Enzymes: No results for input(s): CKTOTAL, CKMB, CKMBINDEX, TROPONINI in the last 168 hours.  BNP: Invalid input(s): POCBNP  CBG: No results for input(s): GLUCAP in the last 168 hours.  Microbiology: Results for orders placed or performed during the hospital encounter of 04/27/19  Culture, blood (routine x 2)     Status: None   Collection Time: 04/27/19  1:07 PM   Specimen: BLOOD  Result Value Ref Range Status   Specimen Description BLOOD BLOOD LEFT HAND  Final   Special Requests   Final    BOTTLES DRAWN AEROBIC AND ANAEROBIC Blood Culture adequate volume   Culture   Final    NO GROWTH 5 DAYS Performed at [facility], [place] [address], [place],  [address]    Report Status 05/02/2019 FINAL  Final  Culture, blood (routine x 2)  Status: None   Collection Time: 04/27/19  5:08 PM   Specimen: BLOOD  Result Value Ref Range Status   Specimen Description BLOOD RIGHT ANTECUBITAL  Final   Special Requests   Final    BOTTLES DRAWN AEROBIC AND ANAEROBIC Blood Culture adequate volume   Culture   Final    NO GROWTH 5 DAYS Performed at [facility], [address] [place], [place] [address]    Report Status 05/02/2019 FINAL  Final  SARS CORONAVIRUS 2 (TAT 6-24 HRS) Nasopharyngeal Nasopharyngeal Swab     Status: Abnormal   Collection Time: 05/05/19  9:45 AM   Specimen: Nasopharyngeal Swab  Result Value Ref Range Status   SARS  Coronavirus 2 POSITIVE (A) NEGATIVE Final    Comment: RESULT CALLED TO, READ BACK BY AND VERIFIED WITH: Geroge Baseman RN 16:45 05/05/19 ([email or web address]) (NOTE) SARS-CoV-2 target nucleic acids are DETECTED. The SARS-CoV-2 RNA is generally detectable in upper and lower respiratory specimens during the acute phase of infection. Positive results are indicative of active infection with SARS-CoV-2. Clinical  correlation with patient history and other diagnostic information is necessary to determine patient infection status. Positive results do  not rule out bacterial infection or co-infection with other viruses. The expected result is Negative. Fact Sheet for Patients: SugarRoll.be Fact Sheet for Healthcare Providers: https://www.woods-mathews.com/ This test is not yet approved or cleared by the [place] FDA and  has been authorized for detection and/or diagnosis of SARS-CoV-2 by FDA under an Emergency Use Authorization (EUA). This EUA will remain  in effect (meaning this test can be used)  for the duration of the COVID-19 declaration under Section 564(b)(1) of the Act, 21 U.S.C. section 360bbb-3(b)(1), unless the authorization is terminated or revoked sooner. Performed at [facility], [place] [address], [place], [place] [address]   Culture, respiratory (non-expectorated)     Status: None   Collection Time: 05/05/19 12:35 PM   Specimen: Tracheal Aspirate; Respiratory  Result Value Ref Range Status   Specimen Description TRACHEAL ASPIRATE  Final   Special Requests NONE  Final   Gram Stain   Final    RARE WBC PRESENT, PREDOMINANTLY PMN ABUNDANT GRAM POSITIVE RODS RARE GRAM NEGATIVE RODS Performed at [facility], [place] [address] [place], [place] [address]    Culture FEW ENTEROBACTER CLOACAE  Final   Report Status 05/07/2019 FINAL  Final   Organism ID, Bacteria ENTEROBACTER CLOACAE  Final      Susceptibility   Enterobacter cloacae - MIC*     CEFAZOLIN >=64 RESISTANT Resistant     CEFEPIME <=1 SENSITIVE Sensitive     CEFTAZIDIME <=1 SENSITIVE Sensitive     CEFTRIAXONE <=1 SENSITIVE Sensitive     CIPROFLOXACIN <=0.25 SENSITIVE Sensitive     GENTAMICIN <=1 SENSITIVE Sensitive     IMIPENEM <=0.25 SENSITIVE Sensitive     TRIMETH/SULFA <=20 SENSITIVE Sensitive     PIP/TAZO <=4 SENSITIVE Sensitive     * FEW ENTEROBACTER CLOACAE  Culture, blood (routine x 2)     Status: None   Collection Time: 05/19/19 12:00 PM   Specimen: BLOOD RIGHT HAND  Result Value Ref Range Status   Specimen Description BLOOD RIGHT HAND  Final   Special Requests   Final    BOTTLES DRAWN AEROBIC ONLY Blood Culture adequate volume   Culture   Final    NO GROWTH 5 DAYS Performed at [facility], [place] [address], [place], [place] [address]    Report Status  05/24/2019 FINAL  Final  Culture, blood (routine x 2)     Status: None   Collection Time: 05/19/19 12:05 PM   Specimen: BLOOD RIGHT HAND  Result Value Ref Range Status   Specimen Description BLOOD RIGHT HAND  Final   Special Requests   Final    BOTTLES DRAWN AEROBIC ONLY Blood Culture results may not be optimal due to an inadequate volume of blood received in culture bottles   Culture   Final    NO GROWTH 5 DAYS Performed at [facility], [place] [address], [place], [place] [address]    Report Status 05/24/2019 FINAL  Final  SARS CORONAVIRUS 2 (TAT 6-24 HRS) Nasopharyngeal Nasopharyngeal Swab     Status: None   Collection Time: 05/21/19 12:56 PM   Specimen: Nasopharyngeal Swab  Result Value Ref Range Status   SARS Coronavirus 2 NEGATIVE NEGATIVE Final    Comment: (NOTE) SARS-CoV-2 target nucleic acids are NOT DETECTED. The SARS-CoV-2 RNA is generally detectable in upper and lower respiratory specimens during the acute phase of infection. Negative results do not preclude SARS-CoV-2 infection, do not rule out co-infections with other pathogens, and should not be used as the sole basis  for treatment or other patient management decisions. Negative results must be combined with clinical observations, patient history, and epidemiological information. The expected result is Negative. Fact Sheet for Patients: SugarRoll.be Fact Sheet for Healthcare Providers: https://www.woods-mathews.com/ This test is not yet approved or cleared by the [place] FDA and  has been authorized for detection and/or diagnosis of SARS-CoV-2 by FDA under an Emergency Use Authorization (EUA). This EUA will remain  in effect (meaning this test can be used) for the duration of the COVID-19 declaration under Section 56 4(b)(1) of the Act, 21 U.S.C. section 360bbb-3(b)(1), unless the authorization is terminated or revoked sooner. Performed at [facility], [place] [address] [place], [place] [address]   SARS CORONAVIRUS 2 (TAT 6-24 HRS) Nasopharyngeal Nasopharyngeal Swab     Status: None   Collection Time: 05/24/19 11:15 AM   Specimen: Nasopharyngeal Swab  Result Value Ref Range Status   SARS Coronavirus 2 NEGATIVE NEGATIVE Final    Comment: (NOTE) SARS-CoV-2 target nucleic acids are NOT DETECTED. The SARS-CoV-2 RNA is generally detectable in upper and lower respiratory specimens during the acute phase of infection. Negative results do not preclude SARS-CoV-2 infection, do not rule out co-infections with other pathogens, and should not be used as the sole basis for treatment or other patient management decisions. Negative results must be combined with clinical observations, patient history, and epidemiological information. The expected result is Negative. Fact Sheet for Patients: SugarRoll.be Fact Sheet for Healthcare Providers: https://www.woods-mathews.com/ This test is not yet approved or cleared by the [place] FDA and  has been authorized for detection and/or diagnosis of SARS-CoV-2 by FDA under  an Emergency Use Authorization (EUA). This EUA will remain  in effect (meaning this test can be used) for the duration of the COVID-19 declaration under Section 56 4(b)(1) of the Act, 21 U.S.C. section 360bbb-3(b)(1), unless the authorization is terminated or revoked sooner. Performed at [facility], [place] [address] [place], [place] [address]     Coagulation Studies: No results for input(s): LABPROT, INR in the last 72 hours.  Urinalysis: No results for input(s): COLORURINE, LABSPEC, PHURINE, GLUCOSEU, HGBUR, BILIRUBINUR, KETONESUR, PROTEINUR, UROBILINOGEN, NITRITE, LEUKOCYTESUR in the last 72 hours.  Invalid input(s): APPERANCEUR    Imaging: No results found.   Medications:    .  acetaminophen  650 mg Oral Q4H  . allopurinol  100 mg Oral Daily  . aspirin  81 mg Oral Daily  . atorvastatin  80 mg Oral Daily  . carvedilol  6.25 mg Oral BID WC  . Chlorhexidine Gluconate Cloth  6 each Topical Q0600  . Chlorhexidine Gluconate Cloth  6 each Topical Q0600  . cinacalcet  30 mg Oral Q supper  . collagenase   Topical Daily  . [START ON 06/10/2019] darbepoetin (ARANESP) injection - DIALYSIS  200 mcg Intravenous Q Thu-HD  . feeding supplement (NEPRO CARB STEADY)  237 mL Oral TID BM  . feeding supplement (PRO-STAT SUGAR FREE 64)  30 mL Oral TID  . heparin injection (subcutaneous)  5,000 Units Subcutaneous Q8H  . mirtazapine  7.5 mg Oral QHS  . multivitamin  1 tablet Oral QHS  . nutrition supplement (JUVEN)  1 packet Oral BID BM  . polyethylene glycol  17 g Oral BID  . senna  1 tablet Oral Daily  . sevelamer carbonate  2.4 g Oral TID WC   heparin, morphine injection, oxyCODONE  Assessment/ Plan:  1. ESRD- on HD TTS.  Not strong enough to sit in recliner for full treatment.    Last dialysis treatment 06/05/2019 with 1 L removed.  Next dialysis treatment be 06/08/2019. 2. Debility- significant weakness as well as cognitive deficits.  Will need SNF placement, however he is too  weak for outpatient HD (unable to sit in recliner long enough. 3. Anemia of CKD stage 5 with ABLA s/p transfusions last month.  Cont with ESA 4. H/o COVID PNA- completed treatment and off of airborne precautions 5. SHPTH with hypercalcemia- on low Ca bath.  Patient on noncalcium based binders 6. Sacral decub-appreciate assistance from plastic surgery   7. Encephalopathy/possible anoxic brain injury/PEA cardiac arrest.  Short-term memory loss requires SNF disposition 8. Diabetes as per primary service   LOS: [place] @TODAY @10 :28 AM

## 2019-06-08 NOTE — Progress Notes (Signed)
Family Medicine Teaching Service Daily Progress Note Intern Pager: [phone]  Patient name: Troy Wells Medical record number: [id number] Date of birth: [birth year] Age: 53 y.o. Gender: male  Primary Care Provider: [name], MD Consultants: Neurology, critical care, nephrology Code Status: Full   Pt Overview and Major Events to Date:  04/27/2019 admitted to ICU 05/02/2019 MRI brain showing anoxic brain injury 05/10/2019: Extubated 05/12/2019: Started receiving care under FPTS 05/20/2019: EEG w/o seizure, Neuro signed off   Assessment and Plan: Troy Wells is a 53 y.o. male transferred from ICU after cardiac arrest with recent history of COVID-19 infection.  PMH significant for ESRD MWF on HD, OSA, HTN, HLD, HFpEF, pHTN, type 2 diabetes, and diabetic neuropathy.  Encephalopathy  Possible anoxic brain injury PEA cardiac arrest (stable) Dispo pending ability to maintain upright position during HD. Alert but short term memory wax and wanes.  -PT/OT consulted, appreciate recommendations -Monitor for improvement - SNF dispo when can tolerate HD  LUE/sacral ulcer Will need to demonstrate ability to sit upright for dialysis for > 3hrs prior to discharge to SNF. Did tolerate 4 hours yesterday. Will likely need to have full HD trial in hospital to prove it is sustainable before discharge. Plastic Surgery plans to see if there is any additional wound support. Patient has PRN oxycodone as needed for pain for HD.  -Up to chair and sit as tolerated -Continue wound care BID; hydrotherapy daily   -Tylenol PRN -Continue turning patient Q2h more on the right side due to left shoulder wound -Plastic recommendations appreciated.   Protein calorie malnutrition  Improving slowly. Nutrition Consulted. Higher protein content may help with wound healing.  Has been eating 20-80% of meals over last few days, eats more with his daughter at bedside.  -Encourage PO intake -RN to document  % of food eaten at each meal  -If persistent poor PO intake consider feeding tube  -Started on Mirtazapine 7.5mg  once daily today -Meals with assistance to encourage PO intake  COVIDEnterobacterHAP  Leukocytosis (Resolved) Sats 100% on RA. Patient now greater than 21 days out from Covid diagnosis. Has been cleared by infectious disease. Repeat Covid test on 05/24/2019 is negative.  -may need repeat COVID prior to dc to SNF, but if retest positive, unlikely to have active infection   ESRD on HDHypercalcemia (Resolved) Secondary hyperparathyroidism Per nephro note on 12/19: No strong enough to sit in recliner for full treatment. Encouraged RN today to call HD unit tomorrow and encourage pt to sit in chair. Dialysis per nephro TTS schedule -Renal function panel three times a week -CBC once weekly -Nephrology following, appreciate recs -Aranesp every Tuesday with HD -Senispar 30mg  daily, renvela 2.4g TID -Oxycodone prior to HD and during HD  Constipation-treating  -Miralax BID, Dulcolax, Senna  HFmEF CAD [facility] 04/27/19 40 to 45%.  LV with mild to moderate decreased fxn,mildly increased LVH,inferiolateral akinesis and anterolateral severe hypokinesis. Grade 2 diastolic dysfunction.  -Continue aspirin 81 -Continue Lipitor 80 mg daily -Coreg 12.5 mg twice daily  Hypertension  Tachycardia, stable  BP 814G systolic, HR 78 -Continue Coreg 12.5 mg twice daily   OSA with use of CPAP OSA at baseline and uses CPAP intermittently. Now 21+ days out from Covid diagnosis, can use CPAP if needed. Refused CPAP ON.  -Continue to encourage patient to use CPAP at bedtime  Diabetes Home medications include glipizide 5 mg BID.Last A1c 5.8 on 04/14/2019.  -Continue to hold home glipizide -would consider discontinuing glipizide on dc to avoid hypoglycemia  HLD Home medications include atorvastatin 40mg  -Atorvastatin 80mg  daily  Diabetic Neuropathy  Patient on home  gabapentin 300 mg TID. Patient reports only taking BID.  -continue to hold homegabapentin  Gout  Home medications: Allopurinol 100mg  daily and colchicine 0.6mg  BID as needed when symptomatic. -Continue home allopurinol  Social/Disposition Bed was available at [facility] 12/9, patient still needs improvement before discharge -Discharge to SNF when strength and nutrition improve  FEN/GI: Dysphagia 3 diet JQZ:ESPQZRA 5000 units subq 3 times daily  Disposition: pending ability to tolerate sitting at HD, possibly on 12/23  Subjective:  Patient eating breakfast. Nurse says improved PO. Patient says he has mild pain in sacrum, but is otherwise comfortable.   Objective: Temp:  [97.3 F (36.3 C)-97.9 F (36.6 C)] 97.3 F (36.3 C) (12/22 0827) Pulse Rate:  [74-85] 74 (12/22 0827) Resp:  [17-20] 17 (12/22 0827) BP: (80-137)/(54-83) 137/78 (12/22 0827) SpO2:  [84 %-99 %] 84 % (12/22 0827)  General: Alert and cooperative and appears to be in no acute distress Cardio: Normal S1 and S2, RRR. No murmurs or rubs.   Pulm: CTAB, no crackles, wheezing, or diminished breath sounds. Normal respiratory effort Abdomen: Bowel sounds normal. Abdomen soft and non-tender.  Rectal: midline sacral pressure ulcer, bandaged Extremities: No peripheral edema. Warm/ well perfused.   Neuro: Cranial nerves grossly intact, ANO X 2   Laboratory: Recent Labs  Lab 06/03/19 1350 06/05/19 0730 06/08/19 0010  WBC 12.5* 8.7 10.1  HGB 9.1* 9.0* 10.5*  HCT 28.6* 27.9* 31.3*  PLT 438* 376 389   Recent Labs  Lab 06/03/19 0334 06/05/19 0729 06/08/19 0644  NA 134* 134* 133*  K 4.7 4.2 5.0  CL 93* 91* 90*  CO2 23 25 24   BUN 56* 48* 76*  CREATININE 9.44* 9.03* 11.11*  CALCIUM 10.3 10.1 10.7*  GLUCOSE 113* 109* 98    Imaging/Diagnostic Tests: No new images   Bonnita Hollow, MD 06/08/2019, 8:48 AM PGY-3, [place] Intern pager: [phone], text pages welcome

## 2019-06-08 NOTE — Progress Notes (Signed)
Nutrition Follow-up  DOCUMENTATION CODES:   Obesity unspecified  INTERVENTION:   Recommend Cortrak NG tube placement and initiation of enteral nutrition given inconsistent intakes. Next services available 12/23 &12/28.  -Nepro Shake poTID, each supplement provides 425 kcal and 19 grams protein (will provide via tube if placed) -Prostat liquid protein PO 30 mlTID with meals, each supplement provides 100 kcal, 15 grams protein. -Juven Fruit PunchBID, each serving provides 95kcal and 2.5g of protein (amino acids glutamine and arginine) -Rena-vit daily  NUTRITION DIAGNOSIS:   Increased nutrient needs related to acute illness(COVID-19) as evidenced by estimated needs.  Ongoing.  GOAL:   Patient will meet greater than or equal to 90% of their needs  Progressing.  MONITOR:   PO intake, Supplement acceptance, Diet advancement, Labs, Weight trends, Skin, I & O's  REASON FOR ASSESSMENT:   Consult Assessment of nutrition requirement/status  ASSESSMENT:   53 yo male admitted in PEA arrest after a syncopal episode at HD center. Intubated on admission. PMH includes recent COVID PNA (positive on 10/28), HTN, CAD, ESRD on HD, HLD, OSA, DM-2.  11/18-11/24: CRRT 11/23 - extubated 11/25 - diet advanced to dysphagia 3 with thin liquids  **RD working remotely**  Patient expected to have next HD today. Pt continues to have weakness. Pt did not eat meals well yesterday, 2 Nepro supplements, 2 Juven and  3 Prostat supplements provided. Supplements ordered provide in total 1765 kcals and 108g protein daily. Will continue to monitor PO intakes today.  In order to meet estimated needs, pt will need to eat meals plus protein supplements that are ordered.  Recommend Cortrak or NGT placement for nutrition support to help pt meet needs and aid in wound healing.  Admission weight: 261 lbs. Current weight: 202 lbs.  I/Os: -5.2L since 12/8 Last HD 12/19: 1L UF  Medications: Remeron tablet,  Rena-vit, Renvela Labs reviewed: Low Na Phos 6.1 Glucose 98  Diet Order:   Diet Order            DIET DYS 3 Room service appropriate? Yes with Assist; Fluid consistency: Thin  Diet effective now              EDUCATION NEEDS:   Not appropriate for education at this time  Skin:  Skin Assessment: Skin Integrity Issues: Skin Integrity Issues:: Stage IV Stage II: sacrum Stage IV: sacrum  Last BM:  12/21 -type 5  Height:   Ht Readings from Last 1 Encounters:  05/01/19 5\' 10"  (1.778 m)    Weight:   Wt Readings from Last 1 Encounters:  06/05/19 91.6 kg    Ideal Body Weight:  75.5 kg  BMI:  Body mass index is 28.98 kg/m.  Estimated Nutritional Needs:   Kcal:  1583-0940  Protein:  120-140 gm  Fluid:  1 L + UOP  Clayton Bibles, MS, RD, LDN Inpatient Clinical Dietitian Pager: [phone] After Hours Pager: [phone]

## 2019-06-08 NOTE — Procedures (Signed)
Patient was offered CPAP however he declined for tonight.

## 2019-06-08 NOTE — Progress Notes (Signed)
FPTS Interim Progress Note Received call from Dr. Migdalia Dk from Plastic surgery in regards to a consult regarding the patient's sacral ulcer. Patient's wound was examined and reported to not look infected and that given the patient's renal failure, he would not be a candidate for extensive surgery such as a flap. It is recommended that the patient shift from left to right sides in order to prevent applying pressure to the wound and allow for healing. It was also recommended that the patient maximized nutrition in order to assist with healing.    Stark Klein, MD 06/08/2019, 2:10 PM PGY-1, [address] [place] Medicine Service pager [phone]

## 2019-06-08 NOTE — Progress Notes (Signed)
Physical Therapy Treatment Patient Details Name: Troy Wells MRN: [id number] DOB: Jan 30, 1966 Today's Date: 06/08/2019    History of Present Illness 53 yo male presenting to [facility] with cardiac arrest and CPR initatied by EMS. Pt at dialysis, complained of SOB, and became unresponsive. Pt with prior hospital admission (10/27 - 11/3) for COVID associated pneumonia; active problems of acute hypoxemic respiratory failure NSTEMI during admission. Intubated 11/10-11/23. MRI showing bilateral medial thalami is suspicious for anoxic injury and chronic R PICA cerebellar infarct.  Transitioned off of CRRT 05/11/19, femoral line removed 05/13/19.PMH including ESRD on HD, chronic anemia, chronic systolic heart failure, CAD, HTN, OSA on CPAP, DM2, and HLD.    PT Comments    At beginning of session, RN in to inform that pt will be going to HD today and the goal is for him to be up in the chair for HD. They have not yet called for patient and she requested PT not get him OOB for fear that he would be too sore or too tired to tolerate sitting up for HD. Patient agreed to bed-level strengthening exercises with very good participation. His myoclonic jerks with intentional movements in all 4 extremities continue to impair his mobility/movements and pt needs incr time for all activity.  Of note: RN mentioned that nursing had a very difficult time trying to use stedy to put pt back to bed yesterday. She mentioned the stedy would not fit under the recliner and ultimately they did stretcher to bed type transfer (using sheet under pt). Educated that the regular stedy has higher wheel profile than the bariatric stedy. She wasn't sure if their unit has a bariatric stedy, but was appreciative of suggestion for nursing to use Maximove for OOB to chair for HD later today.     Follow Up Recommendations  SNF     Equipment Recommendations  Hospital bed;Other (comment)(hoyer)    Recommendations for Other Services        Precautions / Restrictions Precautions Precautions: Fall Precaution Comments: flat affect, minimal memory, myoclonus jerks x 4 extremities (with active movement) Restrictions Weight Bearing Restrictions: No    Mobility  Bed Mobility Overal bed mobility: Needs Assistance Bed Mobility: Rolling Rolling: Min assist         General bed mobility comments: rolled from left to right side and then back to left side  Transfers                 General transfer comment: deferred per RN request; pt to go to HD and sit up in recliner for session (to assess ability to be discharged with OP HD)  Ambulation/Gait             General Gait Details: unable   Stairs             Wheelchair Mobility    Modified Rankin (Stroke Patients Only) Modified Rankin (Stroke Patients Only) Pre-Morbid Rankin Score: No symptoms Modified Rankin: Severe disability     Balance                                            Cognition Arousal/Alertness: Awake/alert Behavior During Therapy: Flat affect Overall Cognitive Status: Impaired/Different from baseline Area of Impairment: Problem solving;Awareness;Following commands;Attention;Memory                   Current Attention Level: Selective Memory: Decreased short-term memory;Decreased  recall of precautions Following Commands: Follows one step commands inconsistently;Follows one step commands with increased time Safety/Judgement: Decreased awareness of safety;Decreased awareness of deficits Awareness: Intellectual Problem Solving: Slow processing;Requires verbal cues;Decreased initiation;Requires tactile cues General Comments: requests repetition of commands ?due to decr comprehension?      Exercises General Exercises - Lower Extremity Ankle Circles/Pumps: AROM;Both;10 reps;Sidelying Other Exercises Other Exercises: sidelying hip abdction/clam x 5 reps x2 sets each leg; required assist to stabilized his  feet together and top hip "stacked" over bottom hip Other Exercises: bridging x 10; air mattress maxi-inflated for stability plus PT stablizing feet and knees (due to myoclonus) Other Exercises: Tied a piece of orange theraband to each upper bed rail: UE shoulder flexion with elbow extension "punches" x 10;  Other Exercises: sidelying manually resisted knee flexion, knee extension x 10 reps each leg    General Comments General comments (skin integrity, edema, etc.): Pt eager "to do anything you want"      Pertinent Vitals/Pain Pain Assessment: Faces Faces Pain Scale: Hurts whole lot Pain Location: sacrum with rolling rt and lt Pain Descriptors / Indicators: Grimacing;Guarding;Tender Pain Intervention(s): Limited activity within patient's tolerance;Monitored during session;Repositioned    Home Living                      Prior Function            PT Goals (current goals can now be found in the care plan section) Acute Rehab PT Goals Patient Stated Goal: to get stronger Time For Goal Achievement: 06/12/19 Potential to Achieve Goals: Fair Progress towards PT goals: Progressing toward goals    Frequency    Min 3X/week      PT Plan Current plan remains appropriate    Co-evaluation              AM-PAC PT "6 Clicks" Mobility   Outcome Measure  Help needed turning from your back to your side while in a flat bed without using bedrails?: A Little Help needed moving from lying on your back to sitting on the side of a flat bed without using bedrails?: A Lot Help needed moving to and from a bed to a chair (including a wheelchair)?: A Lot Help needed standing up from a chair using your arms (e.g., wheelchair or bedside chair)?: Total Help needed to walk in hospital room?: Total Help needed climbing 3-5 steps with a railing? : Total 6 Click Score: 10    End of Session   Activity Tolerance: Patient tolerated treatment well Patient left: with call bell/phone  within reach;in bed Nurse Communication: Other (comment);Need for lift equipment(RN was unable to use stedy well; rec maximove) PT Visit Diagnosis: Other abnormalities of gait and mobility (R26.89);Muscle weakness (generalized) (M62.81);Difficulty in walking, not elsewhere classified (R26.2);Pain Pain - Right/Left: (sacrum) Pain - part of body: (sacrum)     Time: 9449-6759 PT Time Calculation (min) (ACUTE ONLY): 39 min  Charges:  $Therapeutic Exercise: 23-37 mins $Therapeutic Activity: 8-22 mins                      Arby Barrette, PT Pager [phone]    Rexanne Mano 06/08/2019, 1:07 PM

## 2019-06-09 ENCOUNTER — Encounter (HOSPITAL_COMMUNITY): Payer: Self-pay | Admitting: Pulmonary Disease

## 2019-06-09 DIAGNOSIS — R52 Pain, unspecified: Secondary | ICD-10-CM | POA: Diagnosis present

## 2019-06-09 DIAGNOSIS — R159 Full incontinence of feces: Secondary | ICD-10-CM | POA: Diagnosis present

## 2019-06-09 DIAGNOSIS — G253 Myoclonus: Secondary | ICD-10-CM

## 2019-06-09 DIAGNOSIS — L89154 Pressure ulcer of sacral region, stage 4: Secondary | ICD-10-CM

## 2019-06-09 DIAGNOSIS — L89102 Pressure ulcer of unspecified part of back, stage 2: Secondary | ICD-10-CM | POA: Diagnosis present

## 2019-06-09 HISTORY — DX: Myoclonus: G25.3

## 2019-06-09 LAB — GLUCOSE, CAPILLARY: Glucose-Capillary: 70 mg/dL (ref 70–99)

## 2019-06-09 MED ORDER — CHLORHEXIDINE GLUCONATE CLOTH 2 % EX PADS
6.0000 | MEDICATED_PAD | Freq: Every day | CUTANEOUS | Status: DC
  Administered 2019-06-10 – 2019-06-16 (×7): 6 via TOPICAL

## 2019-06-09 MED ORDER — MIRTAZAPINE 15 MG PO TABS
15.0000 mg | ORAL_TABLET | Freq: Every day | ORAL | Status: DC
  Administered 2019-06-09 – 2019-06-15 (×7): 15 mg via ORAL
  Filled 2019-06-09 (×7): qty 1

## 2019-06-09 NOTE — Progress Notes (Signed)
[facility] ROUNDING NOTE   Subjective:   53 year old gentleman end-stage renal disease Monday Wednesday Friday dialysis obstructive sleep apnea hypertension hyperlipidemia diastolic heart failure history of diabetes type 2.  He was admitted to [facility] 04/27/2019 with Covid pneumonia.  MRI 2778 showed anoxic brain injury.  He was extubated 05/10/2019 he is waiting discharge to SNF.  His most recent Covid test was - 05/24/2019  He has a sacral decubitus and protein calorie malnutrition.  He lacks strength to sit in a recliner for his full dialysis treatment.  This seems to be a limiting factor for discharge.  Tolerated dialysis 06/08/2019 with 1.3 L removed  Blood pressure 161/78 pulse 80 temperature 98.1 O2 sats 94%  Sodium 133 potassium 5 chloride 90 CO2 24 BUN 76 creatinine 11.1 calcium 10.7 phosphorus 6.1 albumin 2.8 WBC 10.1 hemoglobin 10.5 platelets 389   Aspirin 81 mg daily atorvastatin 80 mg daily, carvedilol 6.25 mg twice daily, darbepoetin 200 mcg every Thursday last dose was 06/03/2019.,  Multivitamins 1 daily, Renvela 2.4 g 3 times daily  Objective:  Vital signs in last 24 hours:  Temp:  [97.5 F (36.4 C)-98.9 F (37.2 C)] 98.1 F (36.7 C) (12/23 0916) Pulse Rate:  [69-101] 80 (12/23 0916) Resp:  [16-19] 16 (12/23 0916) BP: (89-161)/(44-88) 161/78 (12/23 0916) SpO2:  [94 %-99 %] 94 % (12/23 0916) Weight:  [91 kg-92 kg] 91.2 kg (12/23 0625)  Weight change:  Filed Weights   06/08/19 1415 06/08/19 1830 06/09/19 0625  Weight: 92 kg 91 kg 91.2 kg    Intake/Output: I/O last 3 completed shifts: In: 240 [P.O.:240] Out: 1300 [Other:1300]   Intake/Output this shift:  No intake/output data recorded.  Gen: NAD CVS: no rub Resp: cta Abd: +BS, soft, nt/nd Ext: no edema, LAVF +T/B   Basic Metabolic Panel: Recent Labs  Lab 06/03/19 0334 06/05/19 0729 06/08/19 0644  NA 134* 134* 133*  K 4.7 4.2 5.0  CL 93* 91* 90*  CO2 23 25 24   GLUCOSE  113* 109* 98  BUN 56* 48* 76*  CREATININE 9.44* 9.03* 11.11*  CALCIUM 10.3 10.1 10.7*  PHOS 7.4* 7.0* 6.1*    Liver Function Tests: Recent Labs  Lab 06/03/19 0334 06/05/19 0729 06/08/19 0644  ALBUMIN 2.6* 2.6* 2.8*   No results for input(s): LIPASE, AMYLASE in the last 168 hours. No results for input(s): AMMONIA in the last 168 hours.  CBC: Recent Labs  Lab 06/03/19 1350 06/05/19 0730 06/08/19 0010  WBC 12.5* 8.7 10.1  HGB 9.1* 9.0* 10.5*  HCT 28.6* 27.9* 31.3*  MCV 86.4 85.8 81.9  PLT 438* 376 389    Cardiac Enzymes: No results for input(s): CKTOTAL, CKMB, CKMBINDEX, TROPONINI in the last 168 hours.  BNP: Invalid input(s): POCBNP  CBG: Recent Labs  Lab 06/09/19 0914  GLUCAP 44    Microbiology: Results for orders placed or performed during the hospital encounter of 04/27/19  Culture, blood (routine x 2)     Status: None   Collection Time: 04/27/19  1:07 PM   Specimen: BLOOD  Result Value Ref Range Status   Specimen Description BLOOD BLOOD LEFT HAND  Final   Special Requests   Final    BOTTLES DRAWN AEROBIC AND ANAEROBIC Blood Culture adequate volume   Culture   Final    NO GROWTH 5 DAYS Performed at [facility], [place] [address] [place], [place] [address]    Report Status 05/02/2019 FINAL  Final  Culture, blood (routine x 2)  Status: None   Collection Time: 04/27/19  5:08 PM   Specimen: BLOOD  Result Value Ref Range Status   Specimen Description BLOOD RIGHT ANTECUBITAL  Final   Special Requests   Final    BOTTLES DRAWN AEROBIC AND ANAEROBIC Blood Culture adequate volume   Culture   Final    NO GROWTH 5 DAYS Performed at [facility], [address] [place], [place] [address]    Report Status 05/02/2019 FINAL  Final  SARS CORONAVIRUS 2 (TAT 6-24 HRS) Nasopharyngeal Nasopharyngeal Swab     Status: Abnormal   Collection Time: 05/05/19  9:45 AM   Specimen: Nasopharyngeal Swab  Result Value Ref Range Status   SARS Coronavirus 2  POSITIVE (A) NEGATIVE Final    Comment: RESULT CALLED TO, READ BACK BY AND VERIFIED WITH: Geroge Baseman RN 16:45 05/05/19 ([email or web address]) (NOTE) SARS-CoV-2 target nucleic acids are DETECTED. The SARS-CoV-2 RNA is generally detectable in upper and lower respiratory specimens during the acute phase of infection. Positive results are indicative of active infection with SARS-CoV-2. Clinical  correlation with patient history and other diagnostic information is necessary to determine patient infection status. Positive results do  not rule out bacterial infection or co-infection with other viruses. The expected result is Negative. Fact Sheet for Patients: SugarRoll.be Fact Sheet for Healthcare Providers: https://www.woods-mathews.com/ This test is not yet approved or cleared by the [place] FDA and  has been authorized for detection and/or diagnosis of SARS-CoV-2 by FDA under an Emergency Use Authorization (EUA). This EUA will remain  in effect (meaning this test can be used)  for the duration of the COVID-19 declaration under Section 564(b)(1) of the Act, 21 U.S.C. section 360bbb-3(b)(1), unless the authorization is terminated or revoked sooner. Performed at [facility], [place] [address], [place], [place] [address]   Culture, respiratory (non-expectorated)     Status: None   Collection Time: 05/05/19 12:35 PM   Specimen: Tracheal Aspirate; Respiratory  Result Value Ref Range Status   Specimen Description TRACHEAL ASPIRATE  Final   Special Requests NONE  Final   Gram Stain   Final    RARE WBC PRESENT, PREDOMINANTLY PMN ABUNDANT GRAM POSITIVE RODS RARE GRAM NEGATIVE RODS Performed at [facility], [place] [address], [place], [place] [address]    Culture FEW ENTEROBACTER CLOACAE  Final   Report Status 05/07/2019 FINAL  Final   Organism ID, Bacteria ENTEROBACTER CLOACAE  Final      Susceptibility   Enterobacter cloacae - MIC*    CEFAZOLIN  >=64 RESISTANT Resistant     CEFEPIME <=1 SENSITIVE Sensitive     CEFTAZIDIME <=1 SENSITIVE Sensitive     CEFTRIAXONE <=1 SENSITIVE Sensitive     CIPROFLOXACIN <=0.25 SENSITIVE Sensitive     GENTAMICIN <=1 SENSITIVE Sensitive     IMIPENEM <=0.25 SENSITIVE Sensitive     TRIMETH/SULFA <=20 SENSITIVE Sensitive     PIP/TAZO <=4 SENSITIVE Sensitive     * FEW ENTEROBACTER CLOACAE  Culture, blood (routine x 2)     Status: None   Collection Time: 05/19/19 12:00 PM   Specimen: BLOOD RIGHT HAND  Result Value Ref Range Status   Specimen Description BLOOD RIGHT HAND  Final   Special Requests   Final    BOTTLES DRAWN AEROBIC ONLY Blood Culture adequate volume   Culture   Final    NO GROWTH 5 DAYS Performed at [facility], [place] [address] [place], [place] [address]    Report Status  05/24/2019 FINAL  Final  Culture, blood (routine x 2)     Status: None   Collection Time: 05/19/19 12:05 PM   Specimen: BLOOD RIGHT HAND  Result Value Ref Range Status   Specimen Description BLOOD RIGHT HAND  Final   Special Requests   Final    BOTTLES DRAWN AEROBIC ONLY Blood Culture results may not be optimal due to an inadequate volume of blood received in culture bottles   Culture   Final    NO GROWTH 5 DAYS Performed at [facility], [place] [address] [place], [place] [address]    Report Status 05/24/2019 FINAL  Final  SARS CORONAVIRUS 2 (TAT 6-24 HRS) Nasopharyngeal Nasopharyngeal Swab     Status: None   Collection Time: 05/21/19 12:56 PM   Specimen: Nasopharyngeal Swab  Result Value Ref Range Status   SARS Coronavirus 2 NEGATIVE NEGATIVE Final    Comment: (NOTE) SARS-CoV-2 target nucleic acids are NOT DETECTED. The SARS-CoV-2 RNA is generally detectable in upper and lower respiratory specimens during the acute phase of infection. Negative results do not preclude SARS-CoV-2 infection, do not rule out co-infections with other pathogens, and should not be used as the sole basis for treatment  or other patient management decisions. Negative results must be combined with clinical observations, patient history, and epidemiological information. The expected result is Negative. Fact Sheet for Patients: SugarRoll.be Fact Sheet for Healthcare Providers: https://www.woods-mathews.com/ This test is not yet approved or cleared by the [place] FDA and  has been authorized for detection and/or diagnosis of SARS-CoV-2 by FDA under an Emergency Use Authorization (EUA). This EUA will remain  in effect (meaning this test can be used) for the duration of the COVID-19 declaration under Section 56 4(b)(1) of the Act, 21 U.S.C. section 360bbb-3(b)(1), unless the authorization is terminated or revoked sooner. Performed at [facility], [place] [address] [place], [place] [address]   SARS CORONAVIRUS 2 (TAT 6-24 HRS) Nasopharyngeal Nasopharyngeal Swab     Status: None   Collection Time: 05/24/19 11:15 AM   Specimen: Nasopharyngeal Swab  Result Value Ref Range Status   SARS Coronavirus 2 NEGATIVE NEGATIVE Final    Comment: (NOTE) SARS-CoV-2 target nucleic acids are NOT DETECTED. The SARS-CoV-2 RNA is generally detectable in upper and lower respiratory specimens during the acute phase of infection. Negative results do not preclude SARS-CoV-2 infection, do not rule out co-infections with other pathogens, and should not be used as the sole basis for treatment or other patient management decisions. Negative results must be combined with clinical observations, patient history, and epidemiological information. The expected result is Negative. Fact Sheet for Patients: SugarRoll.be Fact Sheet for Healthcare Providers: https://www.woods-mathews.com/ This test is not yet approved or cleared by the [place] FDA and  has been authorized for detection and/or diagnosis of SARS-CoV-2 by FDA under an Emergency  Use Authorization (EUA). This EUA will remain  in effect (meaning this test can be used) for the duration of the COVID-19 declaration under Section 56 4(b)(1) of the Act, 21 U.S.C. section 360bbb-3(b)(1), unless the authorization is terminated or revoked sooner. Performed at [facility], [place] [address] [place], [place] [address]     Coagulation Studies: No results for input(s): LABPROT, INR in the last 72 hours.  Urinalysis: No results for input(s): COLORURINE, LABSPEC, PHURINE, GLUCOSEU, HGBUR, BILIRUBINUR, KETONESUR, PROTEINUR, UROBILINOGEN, NITRITE, LEUKOCYTESUR in the last 72 hours.  Invalid input(s): APPERANCEUR    Imaging: No results found.   Medications:    .  acetaminophen  650 mg Oral Q4H  . allopurinol  100 mg Oral Daily  . aspirin  81 mg Oral Daily  . atorvastatin  80 mg Oral Daily  . carvedilol  6.25 mg Oral BID WC  . Chlorhexidine Gluconate Cloth  6 each Topical Q0600  . Chlorhexidine Gluconate Cloth  6 each Topical Q0600  . cinacalcet  30 mg Oral Q supper  . [START ON 06/10/2019] darbepoetin (ARANESP) injection - DIALYSIS  200 mcg Intravenous Q Thu-HD  . feeding supplement (NEPRO CARB STEADY)  237 mL Oral TID BM  . feeding supplement (PRO-STAT SUGAR FREE 64)  30 mL Oral TID  . heparin injection (subcutaneous)  5,000 Units Subcutaneous Q8H  . mirtazapine  7.5 mg Oral QHS  . multivitamin  1 tablet Oral QHS  . nutrition supplement (JUVEN)  1 packet Oral BID BM  . polyethylene glycol  17 g Oral BID  . senna  1 tablet Oral Daily  . sevelamer carbonate  2.4 g Oral TID WC   morphine injection, oxyCODONE  Assessment/ Plan:  1. ESRD- on HD TTS.  Not strong enough to sit in recliner for full treatment.    Last dialysis treatment 1.4 L removed 06/08/2019 will schedule next dialysis 06/10/2019 2. Debility- significant weakness as well as cognitive deficits.  Will need SNF placement, however he is too weak for outpatient HD (unable to sit in recliner long  enough. 3. Anemia of CKD stage 5 with ABLA s/p transfusions last month.  Cont with ESA 4. H/o COVID PNA- completed treatment and off of airborne precautions 5. SHPTH with hypercalcemia- on low Ca bath.  Patient on noncalcium based binders 6. Sacral decub-appreciate assistance from plastic surgery   7. Encephalopathy/possible anoxic brain injury/PEA cardiac arrest.  Short-term memory loss requires SNF disposition 8. Diabetes as per primary service   LOS: [place] @TODAY @10 :29 AM

## 2019-06-09 NOTE — Consult Note (Addendum)
[place] Nurse wound follow up Reassessment of sacrum wound.  Plastic surgery team is following for assessment and plan of care; refer to consult note on 12/21/  Pt was previously receiving hydrotherapy but wound is fairly clean at this point and it has been discontinued.  Sacrum with stage 4 pressure injury; slowly decreasing in size, 4X3X4cm, 90% beefy red with 10% bone visible, small amt tan drainage, no odor. Pt is on an air mattress to reduce pressure.  Plastic surgery team has provided topical treatment orders for bedside nurses to perform as follows: Moist gauze dressings are being performed to promote healing.  [place] team will continue to assess the location weekly to determine if a change in the plan of care is required at that time.  Julien Girt MSN, RN, [place], [place], [place]

## 2019-06-09 NOTE — Progress Notes (Signed)
Physical Therapy Treatment Patient Details Name: Troy Wells MRN: [id number] DOB: [birth year] Today's Date: 06/09/2019    History of Present Illness 53 yo male presenting to [facility] with cardiac arrest and CPR initatied by EMS. Pt at dialysis, complained of SOB, and became unresponsive. Pt with prior hospital admission (10/27 - 11/3) for COVID associated pneumonia; active problems of acute hypoxemic respiratory failure NSTEMI during admission. Intubated 11/10-11/23. MRI showing bilateral medial thalami is suspicious for anoxic injury and chronic R PICA cerebellar infarct.  Transitioned off of CRRT 05/11/19, femoral line removed 05/13/19.PMH including ESRD on HD, chronic anemia, chronic systolic heart failure, CAD, HTN, OSA on CPAP, DM2, and HLD.    PT Comments    Pt was seen for work on integration of tone with movement and with mixed flex/ext postures is best able to control the jerking quality of movement in his limbs. Follow pt for these needs, and will reattempt sitting on chair with a cutout type cushion, to avoid WB on sacral wound.  He is motivated to work, but was in pain after sitting up in the chair today.  Retry this strategy tomorrow if able.   Follow Up Recommendations  SNF     Equipment Recommendations  Hospital bed;Other (comment)    Recommendations for Other Services       Precautions / Restrictions Precautions Precautions: Fall Precaution Comments: flat affect, minimal memory, myoclonus jerks x 4 extremities (with active movement) Restrictions Weight Bearing Restrictions: No    Mobility  Bed Mobility Overal bed mobility: Needs Assistance Bed Mobility: Rolling Rolling: Min assist            Transfers                 General transfer comment: not performed  Ambulation/Gait                 Stairs             Wheelchair Mobility    Modified Rankin (Stroke Patients Only)       Balance                                            Cognition Arousal/Alertness: Awake/alert Behavior During Therapy: Flat affect Overall Cognitive Status: Impaired/Different from baseline Area of Impairment: Problem solving;Awareness;Safety/judgement;Following commands;Memory;Attention;Orientation                 Orientation Level: Situation Current Attention Level: Selective Memory: Decreased recall of precautions;Decreased short-term memory Following Commands: Follows one step commands inconsistently;Follows one step commands with increased time Safety/Judgement: Decreased awareness of safety;Decreased awareness of deficits Awareness: Intellectual Problem Solving: Slow processing;Requires verbal cues        Exercises General Exercises - Lower Extremity Ankle Circles/Pumps: AROM;AAROM;5 reps Quad Sets: AAROM;AROM;10 reps Gluteal Sets: AROM;10 reps Heel Slides: AAROM;AROM;10 reps Hip ABduction/ADduction: AROM;AAROM;10 reps Straight Leg Raises: AROM;AAROM;10 reps Hip Flexion/Marching: AROM;AAROM;10 reps    General Comments        Pertinent Vitals/Pain Pain Assessment: Faces Faces Pain Scale: Hurts even more Pain Location: sacrum Pain Descriptors / Indicators: Guarding Pain Intervention(s): Monitored during session;Repositioned    Home Living                      Prior Function            PT Goals (current goals can now be found in the care  plan section) Acute Rehab PT Goals Patient Stated Goal: to get stronger Progress towards PT goals: Progressing toward goals    Frequency    Min 3X/week      PT Plan Current plan remains appropriate    Co-evaluation              AM-PAC PT "6 Clicks" Mobility   Outcome Measure  Help needed turning from your back to your side while in a flat bed without using bedrails?: A Little Help needed moving from lying on your back to sitting on the side of a flat bed without using bedrails?: A Lot Help needed moving to and from a bed to a  chair (including a wheelchair)?: A Lot Help needed standing up from a chair using your arms (e.g., wheelchair or bedside chair)?: A Lot Help needed to walk in hospital room?: Total Help needed climbing 3-5 steps with a railing? : Total 6 Click Score: 11    End of Session   Activity Tolerance: Patient tolerated treatment well;Treatment limited secondary to medical complications (Comment) Patient left: in bed;with call bell/phone within reach Nurse Communication: Mobility status PT Visit Diagnosis: Other abnormalities of gait and mobility (R26.89);Muscle weakness (generalized) (M62.81);Difficulty in walking, not elsewhere classified (R26.2);Pain Pain - Right/Left: (sacrum) Pain - part of body: (sacrum)     Time: 2574-9355 PT Time Calculation (min) (ACUTE ONLY): 34 min  Charges:  $Therapeutic Exercise: 8-22 mins $Therapeutic Activity: 8-22 mins                    Ramond Dial 06/09/2019, 5:09 PM   Mee Hives, PT MS Acute Rehab Dept. Number: [place] [facility] [place]

## 2019-06-09 NOTE — TOC Progression Note (Signed)
Transition of Care [facility]) - Progression Note    Patient Details  Name: Troy Wells MRN: [id number] Date of Birth: 1966/03/24  Transition [facility]) CM/SW Contact  Bartholomew Crews, RN Phone Number: [phone] 06/09/2019, 2:26 PM  Clinical Narrative:    Received request to reach out to family about LTAC option. Spoke with patient's daughter, Steward Sames. She stated that she wanted patient to have a gel cushion pillow at discharge and for patient to be able to sit up in chair for dialysis at least once. Discussed that LTAC was an interim option to prepare patient for home vs SNF that could work on patient's strength and tolerance of HD. [place] interested in speaking with liaison to discuss this option and ask questions. Spoke with liaison at [facility] who will follow up with [place]. TOC team following for transition needs.    Expected Discharge Plan: Skilled Nursing Facility Barriers to Discharge: No Barriers Identified  Expected Discharge Plan and Services Expected Discharge Plan: [place] In-house Referral: Clinical Social Work   Post Acute Care Choice: IP Rehab Living arrangements for the past 2 months: Apartment                 DME Arranged: N/A         HH Arranged: NA           Social Determinants of Health (SDOH) Interventions    Readmission Risk Interventions Readmission Risk Prevention Plan 05/17/2019  Transportation Screening Complete  Medication Review Press photographer) Complete  PCP or Specialist appointment within 3-5 days of discharge Complete  HRI or Home Care Consult Complete  SW Recovery Care/Counseling Consult Complete  Palliative Care Screening Not [place] Complete  Some recent data might be hidden

## 2019-06-09 NOTE — Progress Notes (Addendum)
Family Medicine Teaching Service Daily Progress Note Intern Pager: [phone]  Patient name: Troy Wells Medical record number: [id number] Date of birth: 1966/06/17 Age: 53 y.o. Gender: male  Primary Care Provider: [name], MD Consultants: Neurology, critical care, nephrology Code Status: Full   Pt Overview and Major Events to Date:  04/27/2019 admitted to ICU 05/02/2019 MRI brain showing anoxic brain injury 05/10/2019: Extubated 05/12/2019: Started receiving care under FPTS 05/20/2019: EEG w/o seizure, Neuro signed off   Assessment and Plan: Troy Wells is a 53 y.o. male transferred from ICU after cardiac arrest with recent history of COVID-19 infection.  PMH significant for ESRD MWF on HD, OSA, HTN, HLD, HFpEF, pHTN, type 2 diabetes, and diabetic neuropathy.  Encephalopathy  Possible anoxic brain injury PEA cardiac arrest (stable) Dispo pending ability to maintain upright position during HD. Alert but short term memory wax and wanes.  -PT/OT consulted, appreciate recommendations -Monitor for improvement - SNF dispo when can tolerate HD  LUE/sacral ulcer Will need to demonstrate ability to sit upright for dialysis for > 3hrs prior to discharge to SNF. Did tolerate 4 hours yesterday. Will likely need to have full HD trial in hospital to prove it is sustainable before discharge. Patient has PRN oxycodone as needed for pain for HD.  -Plastic Surgery advice 12/22 appreciate recs: Dr. Migdalia Dk reported sacral did not look infected and that given the patient's renal failure, he would not be a candidate for extensive surgery such as a flap. Recommended that the patient shift from left to right sides in order to prevent applying pressure to the wound and allow for healing. Also maximize nutrition in order to assist with healing.   -Up to chair and sit as tolerated -Continue wound care BID; hydrotherapy daily   -Tylenol PRN -Continue turning patient Q2h more on the right  side due to left shoulder wound  Protein calorie malnutrition  Improving slowly. Nutrition Consulted. Higher protein content may help with wound healing.  Has been eating 20-75% of meals over last few days, eats more with his daughter at bedside.  -Encourage PO intake -RN to document % of food eaten at each meal  -If persistent poor PO intake consider feeding tube  -Mirtazapine 15mg  once daily -Meals with assistance to encourage PO intake  COVIDEnterobacterHAP  Leukocytosis (Resolved) Sats 100% on RA. Patient now greater than 21 days out from Covid diagnosis. Has been cleared by infectious disease. Repeat Covid test on 05/24/2019 is negative.  -may need repeat COVID prior to dc to SNF, but if retest positive, unlikely to have active infection   ESRD on HDHypercalcemia (Resolved) Secondary hyperparathyroidism Per nephro note on 12/19: No strong enough to sit in recliner for full treatment. Encouraged RN today to call HD unit tomorrow and encourage pt to sit in chair. Dialysis per nephro TTS schedule -Renal function panel three times a week -CBC once weekly -Nephrology following, appreciate recs -Aranesp every Tuesday with HD -Senispar 30mg  daily, renvela 2.4g TID -Oxycodone prior to HD and during HD  Constipation-treating  -Miralax BID, Dulcolax, Senna  HFmEF CAD [facility] 04/27/19 40 to 45%.  LV with mild to moderate decreased fxn,mildly increased LVH,inferiolateral akinesis and anterolateral severe hypokinesis. Grade 2 diastolic dysfunction.  -Continue aspirin 81 -Continue Lipitor 80 mg daily -Coreg 12.5 mg twice daily  Hypertension  Tachycardia, stable  BP 629U systolic, HR 78 -Continue Coreg 12.5 mg twice daily   OSA with use of CPAP OSA at baseline and uses CPAP intermittently. Now 21+ days out from  Covid diagnosis, can use CPAP if needed. Refused CPAP ON.  -Continue to encourage patient to use CPAP at bedtime  Diabetes Home medications include  glipizide 5 mg BID.Last A1c 5.8 on 04/14/2019.  -Continue to hold home glipizide -would consider discontinuing glipizide on dc to avoid hypoglycemia  HLD Home medications include atorvastatin 40mg  -Atorvastatin 80mg  daily  Diabetic Neuropathy  Patient on home gabapentin 300 mg TID. Patient reports only taking BID.  -continue to hold homegabapentin  Gout  Home medications: Allopurinol 100mg  daily and colchicine 0.6mg  BID as needed when symptomatic. -Continue home allopurinol  Social/Disposition Bed was available at [facility] 12/9, patient still needs improvement before discharge -Discharge to SNF when strength and nutrition improve  FEN/GI: Dysphagia 3 diet XYB:FXOVANV 5000 units subq 3 times daily  Disposition: pending ability to tolerate sitting at HD, possibly on 12/23  Subjective:  Doing well. No concerns this morning. Encouraged PO intake and explained it will help the healing of his ulcer.   Objective: Temp:  [97.3 F (36.3 C)-98.9 F (37.2 C)] 98.9 F (37.2 C) (12/22 2344) Pulse Rate:  [69-101] 78 (12/23 0100) Resp:  [17-19] 19 (12/22 1830) BP: (89-137)/(44-88) 96/68 (12/22 2344) SpO2:  [84 %-99 %] 99 % (12/22 2344) Weight:  [91 kg-92 kg] 91.2 kg (12/23 0625)  General: Alert and cooperative and appears to be in no acute distress Cardio: Normal S1 and S2, RRR. No murmurs or rubs.   Pulm: CTAB, no crackles, wheezing, or diminished breath sounds. Normal respiratory effort Abdomen: Bowel sounds normal. Abdomen soft and non-tender.  Extremities: No peripheral edema. Warm/ well perfused. Neuro: Cranial nerves grossly intact, ANO X 3  Laboratory: Recent Labs  Lab 06/03/19 1350 06/05/19 0730 06/08/19 0010  WBC 12.5* 8.7 10.1  HGB 9.1* 9.0* 10.5*  HCT 28.6* 27.9* 31.3*  PLT 438* 376 389   Recent Labs  Lab 06/03/19 0334 06/05/19 0729 06/08/19 0644  NA 134* 134* 133*  K 4.7 4.2 5.0  CL 93* 91* 90*  CO2 23 25 24   BUN 56* 48* 76*  CREATININE  9.44* 9.03* 11.11*  CALCIUM 10.3 10.1 10.7*  GLUCOSE 113* 109* 98    Imaging/Diagnostic Tests: No new images   Lattie Haw, MD 06/09/2019, 7:49 AM PGY-1, [place] Intern pager: [phone], text pages welcome

## 2019-06-10 LAB — RENAL FUNCTION PANEL
Albumin: 2.7 g/dL — ABNORMAL LOW (ref 3.5–5.0)
Anion gap: 14 (ref 5–15)
BUN: 61 mg/dL — ABNORMAL HIGH (ref 6–20)
CO2: 24 mmol/L (ref 22–32)
Calcium: 10.3 mg/dL (ref 8.9–10.3)
Chloride: 94 mmol/L — ABNORMAL LOW (ref 98–111)
Creatinine, Ser: 9.5 mg/dL — ABNORMAL HIGH (ref 0.61–1.24)
GFR calc Af Amer: 6 mL/min — ABNORMAL LOW (ref >60)
GFR calc non Af Amer: 6 mL/min — ABNORMAL LOW (ref >60)
Glucose, Bld: 116 mg/dL — ABNORMAL HIGH (ref 70–99)
Phosphorus: 5.1 mg/dL — ABNORMAL HIGH (ref 2.5–4.6)
Potassium: 4.7 mmol/L (ref 3.5–5.1)
Sodium: 132 mmol/L — ABNORMAL LOW (ref 135–145)

## 2019-06-10 LAB — CBC
HCT: 28 % — ABNORMAL LOW (ref 39.0–52.0)
Hemoglobin: 8.8 g/dL — ABNORMAL LOW (ref 13.0–17.0)
MCH: 27.4 pg (ref 26.0–34.0)
MCHC: 31.4 g/dL (ref 30.0–36.0)
MCV: 87.2 fL (ref 80.0–100.0)
Platelets: 388 10*3/uL (ref 150–400)
RBC: 3.21 MIL/uL — ABNORMAL LOW (ref 4.22–5.81)
RDW: 18.8 % — ABNORMAL HIGH (ref 11.5–15.5)
WBC: 7.5 10*3/uL (ref 4.0–10.5)
nRBC: 0 % (ref 0.0–0.2)

## 2019-06-10 LAB — GLUCOSE, CAPILLARY: Glucose-Capillary: 109 mg/dL — ABNORMAL HIGH (ref 70–99)

## 2019-06-10 MED ORDER — ALTEPLASE 2 MG IJ SOLR: 2.0000 mg | Freq: Once | INTRAMUSCULAR | Status: DC | PRN

## 2019-06-10 MED ORDER — OXYCODONE HCL 5 MG PO TABS
ORAL_TABLET | ORAL | Status: AC
  Filled 2019-06-10: qty 2

## 2019-06-10 MED ORDER — DARBEPOETIN ALFA 200 MCG/0.4ML IJ SOSY
PREFILLED_SYRINGE | INTRAMUSCULAR | Status: AC
  Administered 2019-06-10: 200 ug via INTRAVENOUS
  Filled 2019-06-10: qty 0.4

## 2019-06-10 MED ORDER — LIDOCAINE HCL (PF) 1 % IJ SOLN: 5.0000 mL | INTRAMUSCULAR | Status: DC | PRN

## 2019-06-10 MED ORDER — SODIUM CHLORIDE 0.9 % IV SOLN: 100.0000 mL | INTRAVENOUS | Status: DC | PRN

## 2019-06-10 MED ORDER — LIDOCAINE-PRILOCAINE 2.5-2.5 % EX CREA: 1.0000 "application " | TOPICAL_CREAM | CUTANEOUS | Status: DC | PRN

## 2019-06-10 MED ORDER — OXYCODONE HCL 5 MG PO TABS
7.5000 mg | ORAL_TABLET | Freq: Once | ORAL | Status: AC
  Administered 2019-06-13: 7.5 mg via ORAL
  Filled 2019-06-10: qty 2

## 2019-06-10 MED ORDER — OXYCODONE HCL 5 MG PO TABS
7.5000 mg | ORAL_TABLET | ORAL | Status: DC
  Administered 2019-06-15: 7.5 mg via ORAL
  Filled 2019-06-10 (×2): qty 2

## 2019-06-10 MED ORDER — PENTAFLUOROPROP-TETRAFLUOROETH EX AERO: 1.0000 "application " | INHALATION_SPRAY | CUTANEOUS | Status: DC | PRN

## 2019-06-10 MED ORDER — HEPARIN SODIUM (PORCINE) 1000 UNIT/ML DIALYSIS: 1000.0000 [IU] | INTRAMUSCULAR | Status: DC | PRN

## 2019-06-10 NOTE — Progress Notes (Signed)
[facility] ROUNDING NOTE   Subjective:   53 year old gentleman end-stage renal disease Monday Wednesday Friday dialysis obstructive sleep apnea hypertension hyperlipidemia diastolic heart failure history of diabetes type 2.  He was admitted to [facility] 04/27/2019 with Covid pneumonia.  MRI 9417 showed anoxic brain injury.  He was extubated 05/10/2019 he is waiting discharge to SNF.  His most recent Covid test was - 05/24/2019  He has a sacral decubitus and protein calorie malnutrition.  He lacks strength to sit in a recliner for his full dialysis treatment.  This seems to be a limiting factor for discharge.  Patient would make an excellent candidate for [facility].  He is sitting in a recliner. . Tolerated dialysis 06/08/2019 with 1.3 L removed.  Was seen during his dialysis treatment 06/10/2019.  His next dialysis treatment will be 06/13/2019 due to holiday schedule  Blood pressure 96/66 pulse 89 temperature 98.2 O2 sats 9 9% room air  Sodium 132 potassium 4.7 chloride 94 CO2 24 BUN 61 creatinine 9.5 glucose 116 calcium 10.3 phosphorus 5.1 albumin 2.7 WBC 7.5 hemoglobin 8.8 platelets 388   Aspirin 81 mg daily atorvastatin 80 mg daily, carvedilol 6.25 mg twice daily, darbepoetin 200 mcg every Thursday last dose was 06/03/2019.,  Multivitamins 1 daily, Renvela 2.4 g 3 times daily  Objective:  Vital signs in last 24 hours:  Temp:  [98.2 F (36.8 C)-98.9 F (37.2 C)] 98.2 F (36.8 C) (12/24 0745) Pulse Rate:  [79-92] 89 (12/24 0900) Resp:  [16-20] 20 (12/24 0745) BP: (69-143)/(56-88) 96/66 (12/24 0900) SpO2:  [99 %-100 %] 99 % (12/24 0745) Weight:  [87.9 kg-88 kg] 87.9 kg (12/24 0745)  Weight change: -4.002 kg Filed Weights   06/09/19 0625 06/10/19 0445 06/10/19 0745  Weight: 91.2 kg 88 kg 87.9 kg    Intake/Output: I/O last 3 completed shifts: In: 260 [P.O.:240; Other:20] Out: -    Intake/Output this shift:  No intake/output data recorded.  Gen:  NAD CVS: no rub Resp: cta Abd: +BS, soft, nt/nd Ext: no edema, LAVF +T/B   Basic Metabolic Panel: Recent Labs  Lab 06/05/19 0729 06/08/19 0644 06/10/19 0815  NA 134* 133* 132*  K 4.2 5.0 4.7  CL 91* 90* 94*  CO2 25 24 24   GLUCOSE 109* 98 116*  BUN 48* 76* 61*  CREATININE 9.03* 11.11* 9.50*  CALCIUM 10.1 10.7* 10.3  PHOS 7.0* 6.1* 5.1*    Liver Function Tests: Recent Labs  Lab 06/05/19 0729 06/08/19 0644 06/10/19 0815  ALBUMIN 2.6* 2.8* 2.7*   No results for input(s): LIPASE, AMYLASE in the last 168 hours. No results for input(s): AMMONIA in the last 168 hours.  CBC: Recent Labs  Lab 06/03/19 1350 06/05/19 0730 06/08/19 0010 06/10/19 0723  WBC 12.5* 8.7 10.1 7.5  HGB 9.1* 9.0* 10.5* 8.8*  HCT 28.6* 27.9* 31.3* 28.0*  MCV 86.4 85.8 81.9 87.2  PLT 438* 376 389 388    Cardiac Enzymes: No results for input(s): CKTOTAL, CKMB, CKMBINDEX, TROPONINI in the last 168 hours.  BNP: Invalid input(s): POCBNP  CBG: Recent Labs  Lab 06/09/19 0914 06/10/19 0619  GLUCAP 70 109*    Microbiology: Results for orders placed or performed during the hospital encounter of 04/27/19  Culture, blood (routine x 2)     Status: None   Collection Time: 04/27/19  1:07 PM   Specimen: BLOOD  Result Value Ref Range Status   Specimen Description BLOOD BLOOD LEFT HAND  Final   Special Requests   Final  BOTTLES DRAWN AEROBIC AND ANAEROBIC Blood Culture adequate volume   Culture   Final    NO GROWTH 5 DAYS Performed at [facility], [place] [address] [place], [place] [address]    Report Status 05/02/2019 FINAL  Final  Culture, blood (routine x 2)     Status: None   Collection Time: 04/27/19  5:08 PM   Specimen: BLOOD  Result Value Ref Range Status   Specimen Description BLOOD RIGHT ANTECUBITAL  Final   Special Requests   Final    BOTTLES DRAWN AEROBIC AND ANAEROBIC Blood Culture adequate volume   Culture   Final    NO GROWTH 5 DAYS Performed at [facility], [place] [address], [place], [place] [address]    Report Status 05/02/2019 FINAL  Final  SARS CORONAVIRUS 2 (TAT 6-24 HRS) Nasopharyngeal Nasopharyngeal Swab     Status: Abnormal   Collection Time: 05/05/19  9:45 AM   Specimen: Nasopharyngeal Swab  Result Value Ref Range Status   SARS Coronavirus 2 POSITIVE (A) NEGATIVE Final    Comment: RESULT CALLED TO, READ BACK BY AND VERIFIED WITH: Geroge Baseman RN 16:45 05/05/19 ([email or web address]) (NOTE) SARS-CoV-2 target nucleic acids are DETECTED. The SARS-CoV-2 RNA is generally detectable in upper and lower respiratory specimens during the acute phase of infection. Positive results are indicative of active infection with SARS-CoV-2. Clinical  correlation with patient history and other diagnostic information is necessary to determine patient infection status. Positive results do  not rule out bacterial infection or co-infection with other viruses. The expected result is Negative. Fact Sheet for Patients: SugarRoll.be Fact Sheet for Healthcare Providers: https://www.woods-mathews.com/ This test is not yet approved or cleared by the [place] FDA and  has been authorized for detection and/or diagnosis of SARS-CoV-2 by FDA under an Emergency Use Authorization (EUA). This EUA will remain  in effect (meaning this test can be used)  for the duration of the COVID-19 declaration under Section 564(b)(1) of the Act, 21 U.S.C. section 360bbb-3(b)(1), unless the authorization is terminated or revoked sooner. Performed at [facility], [place] [address] [place], [place] [address]   Culture, respiratory (non-expectorated)     Status: None   Collection Time: 05/05/19 12:35 PM   Specimen: Tracheal Aspirate; Respiratory  Result Value Ref Range Status   Specimen Description TRACHEAL ASPIRATE  Final   Special Requests NONE  Final   Gram Stain   Final    RARE WBC PRESENT, PREDOMINANTLY PMN ABUNDANT GRAM POSITIVE  RODS RARE GRAM NEGATIVE RODS Performed at [facility], [place] [address], [place], [place] [address]    Culture FEW ENTEROBACTER CLOACAE  Final   Report Status 05/07/2019 FINAL  Final   Organism ID, Bacteria ENTEROBACTER CLOACAE  Final      Susceptibility   Enterobacter cloacae - MIC*    CEFAZOLIN >=64 RESISTANT Resistant     CEFEPIME <=1 SENSITIVE Sensitive     CEFTAZIDIME <=1 SENSITIVE Sensitive     CEFTRIAXONE <=1 SENSITIVE Sensitive     CIPROFLOXACIN <=0.25 SENSITIVE Sensitive     GENTAMICIN <=1 SENSITIVE Sensitive     IMIPENEM <=0.25 SENSITIVE Sensitive     TRIMETH/SULFA <=20 SENSITIVE Sensitive     PIP/TAZO <=4 SENSITIVE Sensitive     * FEW ENTEROBACTER CLOACAE  Culture, blood (routine x 2)     Status: None   Collection Time: 05/19/19 12:00 PM   Specimen: BLOOD RIGHT HAND  Result Value Ref Range Status   Specimen Description  BLOOD RIGHT HAND  Final   Special Requests   Final    BOTTLES DRAWN AEROBIC ONLY Blood Culture adequate volume   Culture   Final    NO GROWTH 5 DAYS Performed at [facility], [address], [place], [place] [address]    Report Status 05/24/2019 FINAL  Final  Culture, blood (routine x 2)     Status: None   Collection Time: 05/19/19 12:05 PM   Specimen: BLOOD RIGHT HAND  Result Value Ref Range Status   Specimen Description BLOOD RIGHT HAND  Final   Special Requests   Final    BOTTLES DRAWN AEROBIC ONLY Blood Culture results may not be optimal due to an inadequate volume of blood received in culture bottles   Culture   Final    NO GROWTH 5 DAYS Performed at [facility], [place] [address], [place], [place] [address]    Report Status 05/24/2019 FINAL  Final  SARS CORONAVIRUS 2 (TAT 6-24 HRS) Nasopharyngeal Nasopharyngeal Swab     Status: None   Collection Time: 05/21/19 12:56 PM   Specimen: Nasopharyngeal Swab  Result Value Ref Range Status   SARS Coronavirus 2 NEGATIVE NEGATIVE Final    Comment: (NOTE) SARS-CoV-2 target nucleic  acids are NOT DETECTED. The SARS-CoV-2 RNA is generally detectable in upper and lower respiratory specimens during the acute phase of infection. Negative results do not preclude SARS-CoV-2 infection, do not rule out co-infections with other pathogens, and should not be used as the sole basis for treatment or other patient management decisions. Negative results must be combined with clinical observations, patient history, and epidemiological information. The expected result is Negative. Fact Sheet for Patients: SugarRoll.be Fact Sheet for Healthcare Providers: https://www.woods-mathews.com/ This test is not yet approved or cleared by the [place] FDA and  has been authorized for detection and/or diagnosis of SARS-CoV-2 by FDA under an Emergency Use Authorization (EUA). This EUA will remain  in effect (meaning this test can be used) for the duration of the COVID-19 declaration under Section 56 4(b)(1) of the Act, 21 U.S.C. section 360bbb-3(b)(1), unless the authorization is terminated or revoked sooner. Performed at [facility], [place] [address] [place], [place] [address]   SARS CORONAVIRUS 2 (TAT 6-24 HRS) Nasopharyngeal Nasopharyngeal Swab     Status: None   Collection Time: 05/24/19 11:15 AM   Specimen: Nasopharyngeal Swab  Result Value Ref Range Status   SARS Coronavirus 2 NEGATIVE NEGATIVE Final    Comment: (NOTE) SARS-CoV-2 target nucleic acids are NOT DETECTED. The SARS-CoV-2 RNA is generally detectable in upper and lower respiratory specimens during the acute phase of infection. Negative results do not preclude SARS-CoV-2 infection, do not rule out co-infections with other pathogens, and should not be used as the sole basis for treatment or other patient management decisions. Negative results must be combined with clinical observations, patient history, and epidemiological information. The expected result is Negative. Fact  Sheet for Patients: SugarRoll.be Fact Sheet for Healthcare Providers: https://www.woods-mathews.com/ This test is not yet approved or cleared by the [place] FDA and  has been authorized for detection and/or diagnosis of SARS-CoV-2 by FDA under an Emergency Use Authorization (EUA). This EUA will remain  in effect (meaning this test can be used) for the duration of the COVID-19 declaration under Section 56 4(b)(1) of the Act, 21 U.S.C. section 360bbb-3(b)(1), unless the authorization is terminated or revoked sooner. Performed at [facility], [place] [address] [place],  [address]  Coagulation Studies: No results for input(s): LABPROT, INR in the last 72 hours.  Urinalysis: No results for input(s): COLORURINE, LABSPEC, PHURINE, GLUCOSEU, HGBUR, BILIRUBINUR, KETONESUR, PROTEINUR, UROBILINOGEN, NITRITE, LEUKOCYTESUR in the last 72 hours.  Invalid input(s): APPERANCEUR    Imaging: No results found.   Medications:   . sodium chloride    . sodium chloride     . acetaminophen  650 mg Oral Q4H  . allopurinol  100 mg Oral Daily  . aspirin  81 mg Oral Daily  . atorvastatin  80 mg Oral Daily  . carvedilol  6.25 mg Oral BID WC  . Chlorhexidine Gluconate Cloth  6 each Topical Q0600  . cinacalcet  30 mg Oral Q supper  . darbepoetin (ARANESP) injection - DIALYSIS  200 mcg Intravenous Q Thu-HD  . feeding supplement (NEPRO CARB STEADY)  237 mL Oral TID BM  . feeding supplement (PRO-STAT SUGAR FREE 64)  30 mL Oral TID  . heparin injection (subcutaneous)  5,000 Units Subcutaneous Q8H  . mirtazapine  15 mg Oral QHS  . multivitamin  1 tablet Oral QHS  . nutrition supplement (JUVEN)  1 packet Oral BID BM  . oxyCODONE      . polyethylene glycol  17 g Oral BID  . senna  1 tablet Oral Daily  . sevelamer carbonate  2.4 g Oral TID WC   sodium chloride, sodium chloride, alteplase, heparin, lidocaine (PF), lidocaine-prilocaine,  morphine injection, oxyCODONE, pentafluoroprop-tetrafluoroeth  Assessment/ Plan:  1. ESRD- on HD TTS.    Sitting in a recliner.  Last dialysis treatment 1.4 L removed 06/08/2019 appears to be tolerating dialysis 06/10/2019.  Next session will be 06/13/2019 holiday schedule 2. Debility- significant weakness as well as cognitive deficits.  Will need SNF placement, however he is too weak for outpatient HD (unable to sit in recliner long enough. 3. Anemia of CKD stage 5 with ABLA s/p transfusions last month.  Cont with ESA 4. H/o COVID PNA- completed treatment and off of airborne precautions 5. SHPTH with hypercalcemia- on low Ca bath.  Patient on noncalcium based binders 6. Sacral decub-appreciate assistance from plastic surgery   7. Encephalopathy/possible anoxic brain injury/PEA cardiac arrest.  Short-term memory loss requires SNF disposition 8. Diabetes as per primary service   LOS: [place] @TODAY @9 :35 AM

## 2019-06-10 NOTE — Progress Notes (Addendum)
Family Medicine Teaching Service Daily Progress Note Intern Pager: [phone]  Patient name: Troy Wells Medical record number: [id number] Date of birth: December 29, 1965 Age: 53 y.o. Gender: male  Primary Care Provider: [name], MD Consultants: Neurology, critical care, nephrology Code Status: Full   Pt Overview and Major Events to Date:  04/27/2019 admitted to ICU 05/02/2019 MRI brain showing anoxic brain injury 05/10/2019: Extubated 05/12/2019: Started receiving care under FPTS 05/20/2019: EEG w/o seizure, Neuro signed off   Assessment and Plan: Troy Wells is a 53 y.o. male transferred from ICU after cardiac arrest with recent history of COVID-19 infection, now with barrier to discharge of inability to sit up for outpatient HD.  PMH significant for ESRD MWF on HD, OSA, HTN, HLD, HFpEF, pHTN, type 2 diabetes, and diabetic neuropathy.  Encephalopathy  Possible anoxic brain injury PEA cardiac arrest (stable) Remains stable and slowly improving.  Patient remains alert, short-term memory waxes and wanes.  Barrier to discharge remains that patient is unable to tolerate sitting up for 3-4 hours for dialysis. -PT/OT consulted, appreciate recommendations -Monitor for improvement - SNF dispo when can tolerate HD, SW considering LTAC option  LUE/sacral ulcer Causing patient to be unable to tolerate HD.  Complaining of pain while sitting up in HD this AM.  Oxy has been given. -Plastic Surgery advice 12/22 appreciate recs: Dr. Migdalia Dk reported sacral did not look infected and that given the patient's renal failure, he would not be a candidate for extensive surgery such as a flap. Recommended that the patient shift from left to right sides in order to prevent applying pressure to the wound and allow for healing. Also maximize nutrition in order to assist with healing.   -Up to chair and sit as tolerated -Continue wound care BID; hydrotherapy daily   -Tylenol PRN - will schedule  oxy 7.5mg  qTTS to give 1 hr before HD, also ordered 1 time dose oxy 7.5mg  for 12/17, given holiday schedule HD -Continue turning patient Q2h more on the right side due to left shoulder wound  Protein calorie malnutrition  Improving slowly. Nutrition Consulted. Higher protein content may help with wound healing.  Ate 60% of breakfast yesterday, others not recorded, did note good appetite during the day. -Encourage PO intake -RN to document % of food eaten at each meal  -If persistent poor PO intake consider feeding tube  -Mirtazapine 15mg  once daily -Meals with assistance to encourage PO intake  COVIDEnterobacterHAP  Leukocytosis (Resolved) Respiratory status remains stable. Patient now greater than 21 days out from Covid diagnosis. Has been cleared by infectious disease. Repeat Covid test on 05/24/2019 is negative.  -may need repeat COVID prior to dc to SNF, but if retest positive, unlikely to have active infection   ESRD on HDHypercalcemia (Resolved) Secondary hyperparathyroidism Receiving HD today.  K within normal notes, GFR 6, BUN 61. Dialysis per nephro TTS schedule -Renal function panel three times a week -CBC once weekly -Nephrology following, appreciate recs -Aranesp every Tuesday with HD -Senispar 30mg  daily, renvela 2.4g TID -Oxycodone prior to HD and during HD  Constipation-treating  -Miralax BID, Dulcolax, Senna  HFmEF CAD [facility] 04/27/19 40 to 45%.  LV with mild to moderate decreased fxn,mildly increased LVH,inferiolateral akinesis and anterolateral severe hypokinesis. Grade 2 diastolic dysfunction.  -Continue aspirin 81 -Continue Lipitor 80 mg daily -Coreg 12.5 mg twice daily  Hypertension  Tachycardia, stable  133/88, this AM.  No tachycardia overnight. -Continue Coreg 12.5 mg twice daily   OSA with use of CPAP OSA at  baseline and uses CPAP intermittently. Now 21+ days out from Covid diagnosis, can use CPAP if needed.  Adamantly refuses CPAP  overnight. -Continue to encourage patient to use CPAP at bedtime  Diabetes Home medications include glipizide 5 mg BID.Last A1c 5.8 on 04/14/2019.  -Continue to hold home glipizide -would consider discontinuing glipizide on dc to avoid hypoglycemia  HLD Home medications include atorvastatin 40mg  -Atorvastatin 80mg  daily  Diabetic Neuropathy  Patient on home gabapentin 300 mg TID. Patient reports only taking BID.  -continue to hold homegabapentin  Gout  Home medications: Allopurinol 100mg  daily and colchicine 0.6mg  BID as needed when symptomatic. -Continue home allopurinol  Social/Disposition Now considering LTAC for social work notes.  We will continue to follow. -Discharge to SNF when strength and nutrition improve  FEN/GI: Dysphagia 3 diet TIR:WERXVQM 5000 units subq 3 times daily  Disposition: Pending ability to tolerate sitting in HD, SNF versus LTAC  Subjective:  Patient seen in HD.  Complaining of sacral pain, sitting in recliner.  Otherwise no complaints.  Objective: Temp:  [98.1 F (36.7 C)-98.9 F (37.2 C)] 98.2 F (36.8 C) (12/24 0745) Pulse Rate:  [79-92] 89 (12/24 0900) Resp:  [16-20] 20 (12/24 0745) BP: (69-161)/(56-88) 96/66 (12/24 0900) SpO2:  [94 %-100 %] 99 % (12/24 0745) Weight:  [87.9 kg-88 kg] 87.9 kg (12/24 0745)  Physical Exam:  General: 53 y.o. male, sitting in recliner in HD, wincing in pain from sacral wound Cardio: RRR no m/r/g Lungs: CTAB, no wheezing, no rhonchi, no crackles, no IWOB on RA Skin: warm and dry Extremities: No edema   Laboratory: Recent Labs  Lab 06/05/19 0730 06/08/19 0010 06/10/19 0723  WBC 8.7 10.1 7.5  HGB 9.0* 10.5* 8.8*  HCT 27.9* 31.3* 28.0*  PLT 376 389 388   Recent Labs  Lab 06/05/19 0729 06/08/19 0644 06/10/19 0815  NA 134* 133* 132*  K 4.2 5.0 4.7  CL 91* 90* 94*  CO2 25 24 24   BUN 48* 76* 61*  CREATININE 9.03* 11.11* 9.50*  CALCIUM 10.1 10.7* 10.3  GLUCOSE 109* 98 116*     Imaging/Diagnostic Tests: No new images   Troy Dunker, DO 06/10/2019, 9:10 AM PGY-2, [place] Intern pager: [phone], text pages welcome

## 2019-06-10 NOTE — Procedures (Signed)
I have seen and examined this patient and agree with the plan of care   patient was seen during a dialysis treatment appears to be tolerating it well.  He is sitting in a reclining chair.  He is a great candidate for [facility].  For care of wounds  Sherril Croon 06/10/2019, 9:39 AM

## 2019-06-11 NOTE — Progress Notes (Signed)
FPTS Interim Progress Note  S: Paged by RN that patient fell out of her chair and was having a possible seizure.  When I went to see Troy Wells he was sitting upright in his bed alert and speaking in full sentences eating his breakfast.  RN Legrand Como was present who explained that patient was sitting in the chair and tried to lean over to the left to reach something on his tray. Started shaking and then fell out of the seat on his left side.  Denied head injury denied eyes rolling, tongue biting, urinary or fecal incontinence. Patient was alert during the fall.  Pt feels well and felt comfortable in the bed. He had no concerns.   O: BP 138/90 (BP Location: Right Arm)   Pulse 84   Temp 98.3 F (36.8 C) (Oral)   Resp 20   Ht 5\' 10"  (1.778 m)   Wt 94.3 kg   SpO2 100%   BMI 29.84 kg/m    General: Alert, pleasant, no acute distress, speaking in full sentences Cardio: Normal S1 and S2, RRR.    Pulm: CTAB anteriorly,  Abdomen: Bowel sounds normal. Abdomen soft and non-tender.  Extremities: Full range of movement of right and left arm.  No gross deformities of left arm and no tenderness on palpation of shoulder joint.  No peripheral edema. Warm/ well perfused.  Neuro: Cranial nerves grossly intact, ANO X 4  A/P: Likely mechanical fall. No evidence of seizure. Continue care current management of pt.  Recommend that pt continues to have assistance with meals and assistance in reaching for things out of his reach which can minimize his falls risk.   Lattie Haw, MD 06/11/2019, 11:30 AM PGY-1, [place] Medicine Service pager [phone]

## 2019-06-11 NOTE — Progress Notes (Signed)
Patient ID: Troy Wells, male   DOB: 02-Feb-1966, 53 y.o.   MRN: [id number] [facility] Progress Note   Assessment/ Plan:   1.  Recent COVID-19 pneumonia and status post PEA cardiac arrest with hypoxic/anoxic encephalopathy and consequent deconditioning/sacral decubitus ulcer: Ongoing wound care per instructions by plastic surgery earlier.  Continue ongoing physical therapy and efforts at prolonging duration of sitting in recliner for hemodialysis/ongoing recovery.  If able to assist in transfers and sit for entire duration of dialysis, may be able to discharge to SNF rather than LTAC. 2. ESRD: Continue hemodialysis on a Tuesday/Thursday/Saturday schedule with next hemodialysis due on Sunday 12/27 to accommodate for holiday schedule.  He does not have acute dialysis needs at this time.  He has undergone hemodialysis in recliner which he tolerated and plans noted for possible LTAC versus SNF placement.  The etiology of his intermittent myoclonic jerks appears unclear to me but I have reviewed his medication list for the usual suspects that are negative and will order for a longer hemodialysis treatment on Sunday to try and improve clearance/management of azotemia and rule out uremic component. 3. Anemia: With fluctuating hemoglobin/hematocrit status post PRBC transfusions earlier.  Continue high-dose ESA.  No overt loss. 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  Continue cinacalcet for PTH suppression. 5. Nutrition: Continue renal diet with ongoing protein supplementation to optimize wound healing. 6. Hypertension: Blood pressures currently under acceptable control, monitor with ongoing antihypertensive therapy/hemodialysis.  Subjective:   Reports to be feeling fair with occasional pain over his lower back/sacral wound and expresses concern of "jerking and twitching movements of his body".   Objective:   BP 123/76 (BP  Location: Right Arm)   Pulse 93   Temp 99.6 F (37.6 C)   Resp 20   Ht 5\' 10"  (1.778 m)   Wt 94.3 kg   SpO2 99%   BMI 29.84 kg/m   Physical Exam: Gen: Appears to be comfortable laying in bed CVS: Pulse regular rhythm, normal rate, S1 and S2 normal Resp: Anteriorly clear to auscultation, no rales/rhonchi Abd: Soft, flat, nontender Ext: No lower extremity edema, left upper arm arteriovenous graft with decent thrill  Labs: BMET Recent Labs  Lab 06/05/19 0729 06/08/19 0644 06/10/19 0815  NA 134* 133* 132*  K 4.2 5.0 4.7  CL 91* 90* 94*  CO2 25 24 24   GLUCOSE 109* 98 116*  BUN 48* 76* 61*  CREATININE 9.03* 11.11* 9.50*  CALCIUM 10.1 10.7* 10.3  PHOS 7.0* 6.1* 5.1*   CBC Recent Labs  Lab 06/05/19 0730 06/08/19 0010 06/10/19 0723  WBC 8.7 10.1 7.5  HGB 9.0* 10.5* 8.8*  HCT 27.9* 31.3* 28.0*  MCV 85.8 81.9 87.2  PLT 376 389 388     Medications:    . acetaminophen  650 mg Oral Q4H  . allopurinol  100 mg Oral Daily  . aspirin  81 mg Oral Daily  . atorvastatin  80 mg Oral Daily  . carvedilol  6.25 mg Oral BID WC  . Chlorhexidine Gluconate Cloth  6 each Topical Q0600  . cinacalcet  30 mg Oral Q supper  . darbepoetin (ARANESP) injection - DIALYSIS  200 mcg Intravenous Q Thu-HD  . feeding supplement (NEPRO CARB STEADY)  237 mL Oral TID BM  . feeding supplement (PRO-STAT SUGAR FREE 64)  30 mL Oral TID  . heparin injection (subcutaneous)  5,000 Units Subcutaneous Q8H  . mirtazapine  15 mg Oral QHS  .  multivitamin  1 tablet Oral QHS  . nutrition supplement (JUVEN)  1 packet Oral BID BM  . [START ON 06/15/2019] oxyCODONE  7.5 mg Oral Q T,Th,Sa-HD  . [START ON 06/13/2019] oxyCODONE  7.5 mg Oral Once  . polyethylene glycol  17 g Oral BID  . senna  1 tablet Oral Daily  . sevelamer carbonate  2.4 g Oral TID WC   Elmarie Shiley, MD 06/11/2019, 8:01 AM

## 2019-06-11 NOTE — Progress Notes (Signed)
Family Medicine Teaching Service Daily Progress Note Intern Pager: [phone]  Patient name: Troy Wells Medical record number: [id number] Date of birth: [birth year] Age: 53 y.o. Gender: male  Primary Care Provider: [name], MD Consultants: Neurology, critical care, nephrology Code Status: Full   Pt Overview and Major Events to Date:  04/27/2019 admitted to ICU 05/02/2019 MRI brain showing anoxic brain injury 05/10/2019: Extubated 05/12/2019: Started receiving care under FPTS 05/20/2019: EEG w/o seizure, Neuro signed off   Assessment and Plan: Troy Wells is a 53 y.o. male transferred from ICU after cardiac arrest with recent history of COVID-19 infection, now with barrier to discharge of inability to sit up for outpatient HD.  PMH significant for ESRD MWF on HD, OSA, HTN, HLD, HFpEF, pHTN, type 2 diabetes, and diabetic neuropathy.  Encephalopathy  Possible anoxic brain injury PEA cardiac arrest (stable) Remained stable, slowly improving.  Short-term memory waxes and wanes.  Remains alert.  Patient able to stop and require stay for HD, unclear how long. -PT/OT consulted, appreciate recommendations -Monitor for improvement -Dispo pending, SNF versus LTAC  LUE/sacral ulcer Causing patient to be unable to tolerate HD.  Unclear how long patient was able to sit up in the recliner yesterday for HD. -Plastic Surgery advice 12/22 appreciate recs: Dr. Migdalia Dk reported sacral did not look infected and that given the patient's renal failure, he would not be a candidate for extensive surgery such as a flap. Recommended that the patient shift from left to right sides in order to prevent applying pressure to the wound and allow for healing. Also maximize nutrition in order to assist with healing.   -Up to chair and sit as tolerated -Continue wound care BID -Tylenol PRN - cont scheduled oxy 7.5mg  qTTS to give 1 hr before HD, also ordered 1 time dose oxy 7.5mg  for 12/17, given  holiday schedule HD -Continue turning patient Q2h more on the right side due to left shoulder wound  Protein calorie malnutrition  Improving slowly. Nutrition Consulted. Higher protein content may help with wound healing.  Meal percentages not being adequately charted. -Encourage PO intake -RN to document % of food eaten at each meal  -If persistent poor PO intake consider feeding tube  -Mirtazapine 15mg  once daily -Meals with assistance to encourage PO intake  COVIDEnterobacterHAP  Leukocytosis (Resolved) Respiratory status remained stable.  Patient did have desaturation noted to 86 yesterday, unclear reason.  Follow-up this morning not yet charted, last night was on 99% on room air. Patient now greater than 21 days out from Covid diagnosis. Has been cleared by infectious disease. Repeat Covid test on 05/24/2019 is negative.  -may need repeat COVID prior to dc to SNF, but if retest positive, unlikely to have active infection   ESRD on HDHypercalcemia (Resolved) Secondary hyperparathyroidism Received HD yesterday.  Will get off scheduled dialysis on 12/27 due to holiday schedule. Dialysis per nephro TTS schedule -Renal function TTS -CBC once weekly -Nephrology following, appreciate recs -Aranesp every Tuesday with HD -Senispar 30mg  daily, renvela 2.4g TID -Oxycodone sch prior to HD  Constipation-treating  -Miralax BID, Dulcolax, Senna  HFmEF CAD [facility] 04/27/19 40 to 45%.  LV with mild to moderate decreased fxn,mildly increased LVH,inferiolateral akinesis and anterolateral severe hypokinesis. Grade 2 diastolic dysfunction.  -Continue aspirin 81 -Continue Lipitor 80 mg daily -Coreg 12.5 mg twice daily  Hypertension  Tachycardia, stable  Vitals been stable, last blood pressure recorded last night 123/76. -Continue Coreg 12.5 mg twice daily   OSA with use of CPAP OSA  at baseline and uses CPAP intermittently. Now 21+ days out from Covid diagnosis, can use CPAP  if needed.  Adamantly refuses CPAP overnight. -Continue to encourage patient to use CPAP at bedtime  Diabetes Home medications include glipizide 5 mg BID.Last A1c 5.8 on 04/14/2019.  -Continue to hold home glipizide -would consider discontinuing glipizide on dc to avoid hypoglycemia -Check blood glucose on renal function panel  HLD Home medications include atorvastatin 40mg  -Atorvastatin 80mg  daily  Diabetic Neuropathy  Patient on home gabapentin 300 mg TID. Patient reports only taking BID.  -continue to hold homegabapentin  Gout  Home medications: Allopurinol 100mg  daily and colchicine 0.6mg  BID as needed when symptomatic. -Continue home allopurinol  Social/Disposition Now considering LTAC for social work notes.  We will continue to follow. -Discharge to SNF vs LTAC - f/u SW  FEN/GI: Dysphagia 3 diet LFY:BOFBPZW 5000 units subq 3 times daily  Disposition: Pending ability to tolerate sitting in HD, SNF versus LTAC  Subjective:  Patient doing well this AM.  No complaints.  Trying to get out of bed when first walked in because, "I just want to sit up for a little bit."  Called RN for assistance.  Objective: Temp:  [98 F (36.7 C)-99.6 F (37.6 C)] 99.6 F (37.6 C) (12/24 2240) Pulse Rate:  [76-104] 93 (12/24 2240) Resp:  [20] 20 (12/24 2239) BP: (69-136)/(48-115) 123/76 (12/24 2240) SpO2:  [86 %-99 %] 99 % (12/24 2240) Weight:  [94.3 kg] 94.3 kg (12/25 0500)  Physical Exam:  General: 53 y.o. male in NAD Cardio: RRR no m/r/g Lungs: CTAB, no wheezing, no rhonchi, no crackles, no IWOB on RA Extremities: No edema Neuro: A&Ox3, aware that today is Christmas    Laboratory: Recent Labs  Lab 06/05/19 0730 06/08/19 0010 06/10/19 0723  WBC 8.7 10.1 7.5  HGB 9.0* 10.5* 8.8*  HCT 27.9* 31.3* 28.0*  PLT 376 389 388   Recent Labs  Lab 06/05/19 0729 06/08/19 0644 06/10/19 0815  NA 134* 133* 132*  K 4.2 5.0 4.7  CL 91* 90* 94*  CO2 25 24 24   BUN 48*  76* 61*  CREATININE 9.03* 11.11* 9.50*  CALCIUM 10.1 10.7* 10.3  GLUCOSE 109* 98 116*    Imaging/Diagnostic Tests: No new images   [name], DO 06/11/2019, 7:50 AM PGY-2, [place] Intern pager: [phone], text pages welcome

## 2019-06-12 LAB — RENAL FUNCTION PANEL
Albumin: 2.7 g/dL — ABNORMAL LOW (ref 3.5–5.0)
Anion gap: 14 (ref 5–15)
BUN: 65 mg/dL — ABNORMAL HIGH (ref 6–20)
CO2: 25 mmol/L (ref 22–32)
Calcium: 10.4 mg/dL — ABNORMAL HIGH (ref 8.9–10.3)
Chloride: 98 mmol/L (ref 98–111)
Creatinine, Ser: 9.35 mg/dL — ABNORMAL HIGH (ref 0.61–1.24)
GFR calc Af Amer: 7 mL/min — ABNORMAL LOW (ref >60)
GFR calc non Af Amer: 6 mL/min — ABNORMAL LOW (ref >60)
Glucose, Bld: 104 mg/dL — ABNORMAL HIGH (ref 70–99)
Phosphorus: 3.6 mg/dL (ref 2.5–4.6)
Potassium: 4.4 mmol/L (ref 3.5–5.1)
Sodium: 137 mmol/L (ref 135–145)

## 2019-06-12 NOTE — Progress Notes (Signed)
Pt placed in reclining chair via sling lift at 1430 ,adjusted weight to slightly side sitting to avoid sacral pressure.

## 2019-06-12 NOTE — Progress Notes (Signed)
Patient ID: Troy Wells, male   DOB: Aug 01, 1965, 53 y.o.   MRN: [id number] [facility] Progress Note   Assessment/ Plan:   1.  Recent COVID-19 pneumonia and status post PEA cardiac arrest with hypoxic/anoxic encephalopathy and consequent deconditioning/sacral decubitus ulcer: Ongoing wound care per instructions by plastic surgery earlier.  Earlier this week was able to get dialysis in recliner and will attempt this again tomorrow. 2. ESRD: Continue hemodialysis on a Tuesday/Thursday/Saturday schedule with next hemodialysis due tomorrow to accommodate for holiday schedule.  He will undergo hemodialysis in a recliner for 4 hours to try and alleviate azotemia as the etiology of his myoclonic jerks.  If continues to tolerate HD in recliner, admission to SNF will be an option rather than LTAC he can continue to go to OP HD. 3. Anemia: Status post PRBC transfusions and currently on high-dose ESA, will follow H/H trend. 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  Continue cinacalcet for PTH suppression. 5. Nutrition: Continue renal diet with ongoing protein supplementation to optimize wound healing. 6. Hypertension: Blood pressures currently under acceptable control, monitor with ongoing antihypertensive therapy/hemodialysis.  Subjective:   Denies any complaints other than jerking movements of arms.   Objective:   BP 140/90 (BP Location: Right Arm)   Pulse 78   Temp 98.2 F (36.8 C) (Oral)   Resp 16   Ht 5\' 10"  (1.778 m)   Wt 94.3 kg   SpO2 99%   BMI 29.83 kg/m   Physical Exam: Gen: Resting comfortably in bed with intermittent myoclonic jerks CVS: Pulse regular rhythm, normal rate, S1 and S2 normal Resp: Anteriorly clear to auscultation, no rales/rhonchi Abd: Soft, flat, nontender Ext: No lower extremity edema, left upper arm arteriovenous graft with decent thrill  Labs: BMET Recent Labs  Lab 06/08/19 0644  06/10/19 0815  NA 133* 132*  K 5.0 4.7  CL 90* 94*  CO2 24 24  GLUCOSE 98 116*  BUN 76* 61*  CREATININE 11.11* 9.50*  CALCIUM 10.7* 10.3  PHOS 6.1* 5.1*   CBC Recent Labs  Lab 06/08/19 0010 06/10/19 0723  WBC 10.1 7.5  HGB 10.5* 8.8*  HCT 31.3* 28.0*  MCV 81.9 87.2  PLT 389 388     Medications:    . acetaminophen  650 mg Oral Q4H  . allopurinol  100 mg Oral Daily  . aspirin  81 mg Oral Daily  . atorvastatin  80 mg Oral Daily  . carvedilol  6.25 mg Oral BID WC  . Chlorhexidine Gluconate Cloth  6 each Topical Q0600  . cinacalcet  30 mg Oral Q supper  . darbepoetin (ARANESP) injection - DIALYSIS  200 mcg Intravenous Q Thu-HD  . feeding supplement (NEPRO CARB STEADY)  237 mL Oral TID BM  . feeding supplement (PRO-STAT SUGAR FREE 64)  30 mL Oral TID  . heparin injection (subcutaneous)  5,000 Units Subcutaneous Q8H  . mirtazapine  15 mg Oral QHS  . multivitamin  1 tablet Oral QHS  . nutrition supplement (JUVEN)  1 packet Oral BID BM  . [START ON 06/15/2019] oxyCODONE  7.5 mg Oral Q T,Th,Sa-HD  . [START ON 06/13/2019] oxyCODONE  7.5 mg Oral Once  . polyethylene glycol  17 g Oral BID  . senna  1 tablet Oral Daily  . sevelamer carbonate  2.4 g Oral TID WC   Elmarie Shiley, MD 06/12/2019, 8:15 AM

## 2019-06-12 NOTE — Progress Notes (Addendum)
Family Medicine Teaching Service Daily Progress Note Intern Pager: [phone]  Patient name: Troy Wells Medical record number: [id number] Date of birth: 1965/08/10 Age: 53 y.o. Gender: male  Primary Care Provider: [name], MD Consultants: Neurology (signed off), General Surgery (signed off), Plastic Surgery (signed off), critical care (signed off), Cardiology (Signed off), nephrology Code Status: Full   Pt Overview and Major Events to Date:  04/27/2019 admitted to ICU 05/02/2019 MRI brain showing anoxic brain injury 05/10/2019: Extubated 05/12/2019: Started receiving care under FPTS 05/20/2019: EEG w/o seizure, Neuro signed off 12/14 surgery consulted for sacral ulcer, signed of 12/21 Plastic surgery consulted for sacral ulcer, signed off  Assessment and Plan: Troy Wells is a 53 y.o. male transferred from ICU after cardiac arrest with recent history of COVID-19 infection, now with barrier to discharge of inability to sit up for outpatient HD.  PMH significant for ESRD MWF on HD, OSA, HTN, HLD, HFpEF, pHTN, type 2 diabetes, and diabetic neuropathy.  Encephalopathy  Possible anoxic brain injury PEA cardiac arrest (stable) ? Anoxic brain injury on MRI from 11/5. Has progressed well in terms of short-term memory since leaving ICU but this still waxes and wanes. Knew yesterday was Christmas, oriented to self, location. His biggest issue at this point is dispo. His biggest limiting factor is his ability to sit in a chair for 4 hours. He was able to do this on 12/25, Social work informed via voicemail, although placement might be challenging over the holiday/weekend. He is medically ready and stable for discharge to SNF. - PT/OT continue to eval and treat - dispo pending, SNF vs LTAC - Medically stable for discharge  LUE/sacral ulcer/debility Causing patient to be unable to tolerate HD.  Unclear how long patient was able to sit up in the recliner yesterday for HD. Not a  candidate for a skin graft per plastic surgery. Not infected so routine wound care from wound care team per general and plastic surgery. - continue to maximize offloading of pressure from sacral wound with rotation - wound care following appreciate recs - tylenol prn - up in chair as tolerated, goal > 4 hours per day - cont scheduled oxy 7.5mg  qTTS to give 1 hr before HD, also ordered 1 time dose oxy 7.5mg  for 12/27, given holiday schedule HD  Protein calorie malnutrition  Improving slowly. Nutrition Consulted. Higher protein content may help with wound healing. 100% of meal. Alb 2.7 on 12/24, pre-alb 37 on 12/16. -Encourage PO intake -RN to document % of food eaten at each meal  -Mirtazapine 15mg  once daily -Meals with assistance to encourage PO intake  COVIDEnterobacterHAP  Leukocytosis (Resolved) Respiratory status remained stable.  No desats noted last 24 hours. >21 days out from covid infection, cleared by ID. Repeat covid test 12/7 negative. -may need repeat COVID prior to dc to SNF, but if retest positive, unlikely to have active infection   ESRD on HDHypercalcemia (Resolved) Secondary hyperparathyroidism Received HD yesterday.  Will get off scheduled dialysis on 12/27 due to holiday schedule. Dialysis per nephro TTS schedule -Renal function TTS -CBC once weekly -Nephrology following, appreciate recs -Aranesp every Tuesday with HD -Senispar 30mg  daily, renvela 2.4g TID -Oxycodone sch prior to HD  Constipation-treating  -Miralax BID, Dulcolax, Senna  HFmEF CAD [facility] 04/27/19 40 to 45%.  LV with mild to moderate decreased fxn,mildly increased LVH,inferiolateral akinesis and anterolateral severe hypokinesis. Grade 2 diastolic dysfunction.  -Continue aspirin 81 -Continue Lipitor 80 mg daily -Coreg 6.25 mg twice daily  Hypertension  Tachycardia, stable  Vitals been stable, last blood pressure recorded 131/85 -Continue Coreg 6.25 mg twice daily   OSA  with use of CPAP OSA at baseline and uses CPAP intermittently. Now 21+ days out from Covid diagnosis, can use CPAP if needed.  Adamantly refuses CPAP overnight. -Continue to encourage patient to use CPAP at bedtime  Diabetes Home medications include glipizide 5 mg BID.Last A1c 5.8 on 04/14/2019.  -Continue to hold home glipizide -would consider discontinuing glipizide on dc to avoid hypoglycemia -Check blood glucose on renal function panel  HLD Home medications include atorvastatin 40mg  -Atorvastatin 80mg  daily  Diabetic Neuropathy  Patient on home gabapentin 300 mg TID. Patient reports only taking BID.  -continue to hold homegabapentin  Gout  Home medications: Allopurinol 100mg  daily and colchicine 0.6mg  BID as needed when symptomatic. -Continue home allopurinol  Social/Disposition Now considering LTAC for social work notes.  We will continue to follow. -Discharge to SNF vs LTAC - f/u SW  FEN/GI: Dysphagia 3 diet [id number] 5000 units subq 3 times daily  Disposition: Pending ability to tolerate sitting in HD, SNF versus LTAC  Subjective:  Doing well this am. No residual injury from fall on 12/25. Knew yesterday was christmas, oriented to self and place.  Objective: Temp:  [98 F (36.7 C)-98.3 F (36.8 C)] 98 F (36.7 C) (12/25 2331) Pulse Rate:  [81-95] 81 (12/25 2331) Resp:  [16] 16 (12/25 2331) BP: (123-138)/(80-90) 131/85 (12/25 2331) SpO2:  [95 %-100 %] 95 % (12/25 2331) Weight:  [94.3 kg] 94.3 kg (12/26 0500)  Physical Exam:  General: 53 year old AA male, no acute distress Cardio: RRR, no m/r/g. Skin warm and dry Lungs: Lungs CTAB, no accessory muscle use Extremities: No edema Neuro: AOx3, no focal neuro deficit, no tremor or myoclonic jerks this am   Laboratory: Recent Labs  Lab 06/05/19 0730 06/08/19 0010 06/10/19 0723  WBC 8.7 10.1 7.5  HGB 9.0* 10.5* 8.8*  HCT 27.9* 31.3* 28.0*  PLT 376 389 388   Recent Labs  Lab 06/05/19 0729  06/08/19 0644 06/10/19 0815  NA 134* 133* 132*  K 4.2 5.0 4.7  CL 91* 90* 94*  CO2 25 24 24   BUN 48* 76* 61*  CREATININE 9.03* 11.11* 9.50*  CALCIUM 10.1 10.7* 10.3  GLUCOSE 109* 98 116*    Imaging/Diagnostic Tests: No new images   Guadalupe Dawn, MD 06/12/2019, 6:50 AM PGY-3, [place] Intern pager: [phone], text pages welcome

## 2019-06-13 LAB — RENAL FUNCTION PANEL
Albumin: 2.6 g/dL — ABNORMAL LOW (ref 3.5–5.0)
Anion gap: 14 (ref 5–15)
BUN: 95 mg/dL — ABNORMAL HIGH (ref 6–20)
CO2: 25 mmol/L (ref 22–32)
Calcium: 10.4 mg/dL — ABNORMAL HIGH (ref 8.9–10.3)
Chloride: 96 mmol/L — ABNORMAL LOW (ref 98–111)
Creatinine, Ser: 10.84 mg/dL — ABNORMAL HIGH (ref 0.61–1.24)
GFR calc Af Amer: 6 mL/min — ABNORMAL LOW (ref >60)
GFR calc non Af Amer: 5 mL/min — ABNORMAL LOW (ref >60)
Glucose, Bld: 127 mg/dL — ABNORMAL HIGH (ref 70–99)
Phosphorus: 2.7 mg/dL (ref 2.5–4.6)
Potassium: 4.4 mmol/L (ref 3.5–5.1)
Sodium: 135 mmol/L (ref 135–145)

## 2019-06-13 LAB — CBC
HCT: 25.9 % — ABNORMAL LOW (ref 39.0–52.0)
Hemoglobin: 8.2 g/dL — ABNORMAL LOW (ref 13.0–17.0)
MCH: 27.9 pg (ref 26.0–34.0)
MCHC: 31.7 g/dL (ref 30.0–36.0)
MCV: 88.1 fL (ref 80.0–100.0)
Platelets: 354 10*3/uL (ref 150–400)
RBC: 2.94 MIL/uL — ABNORMAL LOW (ref 4.22–5.81)
RDW: 19.9 % — ABNORMAL HIGH (ref 11.5–15.5)
WBC: 7.5 10*3/uL (ref 4.0–10.5)
nRBC: 0.4 % — ABNORMAL HIGH (ref 0.0–0.2)

## 2019-06-13 MED ORDER — HEPARIN SODIUM (PORCINE) 1000 UNIT/ML DIALYSIS: 40.0000 [IU]/kg | INTRAMUSCULAR | Status: DC | PRN

## 2019-06-13 NOTE — Procedures (Signed)
Patient seen on Hemodialysis. BP 126/75   Pulse 79   Temp 97.7 F (36.5 C) (Oral)   Resp 18   Ht 5\' 10"  (1.778 m)   Wt 94.3 kg   SpO2 99%   BMI 29.83 kg/m   QB 400, UF goal 2L Tolerating treatment without complaints at this time while sitting in a recliner.   Troy Shiley MD [facility]. Office # [phone] Pager # [phone] 9:28 AM

## 2019-06-13 NOTE — Progress Notes (Signed)
Family Medicine Teaching Service Daily Progress Note Intern Pager: [phone]  Patient name: Troy Wells Medical record number: [id number] Date of birth: June 07, 1966 Age: 53 y.o. Gender: male  Primary Care Provider: [name], MD Consultants: Neurology (signed off), General Surgery (signed off), Plastic Surgery (signed off), critical care (signed off), Cardiology (Signed off), nephrology Code Status: Full   Pt Overview and Major Events to Date:  04/27/2019 admitted to ICU 05/02/2019 MRI brain showing anoxic brain injury 05/10/2019: Extubated 05/12/2019: Started receiving care under FPTS 05/20/2019: EEG w/o seizure, Neuro signed off 12/14 surgery consulted for sacral ulcer, signed of 12/21 Plastic surgery consulted for sacral ulcer, signed off  Assessment and Plan: Troy Wells is a 53 y.o. male transferred from ICU after cardiac arrest with recent history of COVID-19 infection, now with barrier to discharge of inability to sit up for outpatient HD.  PMH significant for ESRD MWF on HD, OSA, HTN, HLD, HFpEF, pHTN, type 2 diabetes, and diabetic neuropathy.  Encephalopathy  anoxic brain injury s/p PEA cardiac arrest: stable Anoxic brain injury on MRI from 11/5. Has progressed well in terms of short-term memory since leaving ICU but this still waxes and wanes. limiting factor was his ability to sit in a HD chair for 4 hours which is now possible. Social work informed via Advertising account executive, although placement might be challenging over the holiday/weekend. He is medically ready and stable for discharge to SNF. - PT/OT continue to eval and treat -Intermittent myoclonic jerking still continues - dispo pending, SNF vs LTAC - Medically stable for d/c  LUE/sacral ulcer/debility- stable Not a candidate for a skin graft per plastic surgery. routine wound care from wound care team per general and plastic surgery. - continue to maximize offloading of pressure from sacral wound with rotation -  wound care following appreciate recs - tylenol prn - up in chair as tolerated, goal > 4 hours per day - cont scheduled oxy 7.5mg  qTTS to give 1 hr before HD, also ordered 1 time dose oxy 7.5mg  for 12/27, given holiday schedule HD  Protein calorie malnutrition-stable Improving slowly. Nutrition Consulted. Higher protein content may help with wound healing. 100% of meal. Alb 2.7 on 12/24, pre-alb 37 on 12/16. -Encourage PO intake -RN to document % of food eaten at each meal  -Mirtazapine 15mg  once daily -Meals with assistance to encourage PO intake  COVIDEnterobacterHAP  Leukocytosis (resolved) -may need repeat COVID prior to dc to SNF, but if retest positive, unlikely to have active infection   ESRD on HDstable Secondary hyperparathyroidism -chronic Will get dialysis on 12/27 due to holiday schedule. Dialysis per nephro TTS schedule -Renal function TTS -CBC once weekly -Nephrology following, appreciate recs -Aranesp every Tuesday with HD -Senispar 30mg  daily, renvela 2.4g TID -Oxycodone sch prior to HD  Constipation-treating  -Miralax BID, Dulcolax, Senna  HFmEF CAD [facility] 04/27/19 40 to 45%.  LV with mild to moderate decreased fxn,mildly increased LVH,inferiolateral akinesis and anterolateral severe hypokinesis. Grade 2 diastolic dysfunction.  -Continue aspirin 81 -Continue Lipitor 80 mg daily -Coreg 6.25 mg twice daily  Hypertension  Tachycardia, stable  Vitals been stable, last blood pressure recorded 131/85 -Continue Coreg 6.25 mg twice daily   OSA with use of CPAP-stable OSA at baseline and uses CPAP intermittently. Now 21+ days out from Covid diagnosis, can use CPAP if needed.  Adamantly refuses CPAP overnight. -Continue to encourage patient to use CPAP at bedtime  Diabetes -stable cbgs <150 Home medications include glipizide 5 mg BID.Last A1c 5.8 on 04/14/2019.  -Continue  to hold home glipizide -would consider discontinuing glipizide on dc to  avoid hypoglycemia -Check blood glucose on renal function panel  HLD Home medications include atorvastatin 40mg  -Atorvastatin 80mg  daily  Diabetic Neuropathy  Patient on home gabapentin 300 mg TID. Patient reports only taking BID.  -continue to hold homegabapentin  Gout  Home medications: Allopurinol 100mg  daily and colchicine 0.6mg  BID as needed when symptomatic. -Continue home allopurinol  Social/Disposition Now considering LTAC for social work notes.  We will continue to follow. -Discharge to SNF vs LTAC - f/u SW  FEN/GI: Dysphagia 3 diet [id number] 5000 units subq 3 times daily  Disposition: Medically ready for d/c.  SNF versus LTAC  Subjective:  Pleasant, claims no pain laying in air mattress, says he tries to avoid sitting long but has no requests  Objective: Temp:  [98.1 F (36.7 C)-98.6 F (37 C)] 98.6 F (37 C) (12/26 2226) Pulse Rate:  [78-88] 88 (12/26 2226) Resp:  [16-20] 20 (12/26 2226) BP: (140-149)/(82-90) 149/90 (12/26 2226) SpO2:  [99 %-100 %] 100 % (12/26 2226) Weight:  [94.3 kg] 94.3 kg (12/26 0500)  Physical Exam:  General: Watching TV in bed, no distress Cardio: Regular rate and rhythm, no murmurs found Lungs: No increased work of breathing, clear to auscultation bilaterally Extremities: No noted edema Neuro: ANO x2, no focal neuro deficit, minor myoclonic jerking   Laboratory: Recent Labs  Lab 06/08/19 0010 06/10/19 0723  WBC 10.1 7.5  HGB 10.5* 8.8*  HCT 31.3* 28.0*  PLT 389 388   Recent Labs  Lab 06/10/19 0815 06/12/19 0809 06/13/19 0223  NA 132* 137 135  K 4.7 4.4 4.4  CL 94* 98 96*  CO2 24 25 25   BUN 61* 65* 95*  CREATININE 9.50* 9.35* 10.84*  CALCIUM 10.3 10.4* 10.4*  GLUCOSE 116* 104* 127*    Imaging/Diagnostic Tests: No new images   Sherene Sires, DO 06/13/2019, 4:47 AM PGY-3, [place] Intern pager: [phone], text pages welcome

## 2019-06-14 DIAGNOSIS — E1122 Type 2 diabetes mellitus with diabetic chronic kidney disease: Secondary | ICD-10-CM

## 2019-06-14 LAB — SARS CORONAVIRUS 2 (TAT 6-24 HRS): SARS Coronavirus 2: NEGATIVE

## 2019-06-14 MED ORDER — CHLORHEXIDINE GLUCONATE CLOTH 2 % EX PADS
6.0000 | MEDICATED_PAD | Freq: Every day | CUTANEOUS | Status: DC
  Administered 2019-06-14 – 2019-06-16 (×3): 6 via TOPICAL

## 2019-06-14 NOTE — Progress Notes (Signed)
Family Medicine Teaching Service Daily Progress Note Intern Pager: [phone]  Patient name: Troy Wells Medical record number: [id number] Date of birth: 11-21-1965 Age: 53 y.o. Gender: male  Primary Care Provider: [name], MD Consultants: Neurology (signed off), General Surgery (signed off), Plastic Surgery (signed off), critical care (signed off), Cardiology (Signed off), nephrology Code Status: Full   Pt Overview and Major Events to Date:  04/27/2019 admitted to ICU 05/02/2019 MRI brain showing anoxic brain injury 05/10/2019: Extubated 05/12/2019: Started receiving care under FPTS 05/20/2019: EEG w/o seizure, Neuro signed off 12/14 surgery consulted for sacral ulcer, signed of 12/21 Plastic surgery consulted for sacral ulcer, signed off  Assessment and Plan: Troy Wells is a 53 y.o. male transferred from ICU after cardiac arrest with recent history of COVID-19 infection, now with barrier to discharge of inability to sit up for outpatient HD.  PMH significant for ESRD MWF on HD, OSA, HTN, HLD, HFpEF, pHTN, type 2 diabetes, and diabetic neuropathy.  Encephalopathy  anoxic brain injury s/p PEA cardiac arrest: stable Anoxic brain injury on MRI from 11/5.  Patient continues to have improvement with his overall status, but short-term memory does wax and wane.  Patient is medically ready and stable for discharge to SNF as he is tolerating sitting in chair for 3 hours for HD. - PT/OT continue to eval and treat -Intermittent myoclonic jerking still continues - dispo pending, SNF vs LTAC - Medically stable for d/c  LUE/sacral ulcer/debility- stable Not a candidate for a skin graft per plastic surgery. routine wound care from wound care team per general and plastic surgery. - continue to maximize offloading of pressure from sacral wound with rotation - wound care following appreciate recs - tylenol prn - up in chair as tolerated, goal > 4 hours per day - cont scheduled  oxy 7.5mg  qTTS to give 1 hr before HD, also ordered 1 time dose oxy 7.5mg  for 12/27, given holiday schedule HD  Protein calorie malnutrition-stable Improving slowly. Nutrition Consulted. Higher protein content may help with wound healing. 100% of meal.  Abdomen 2.6 on 12/27, pre-alb 37 on 12/16. -Encourage PO intake -RN to document % of food eaten at each meal  -Mirtazapine 15mg  once daily, for appetite stimulation -Meals with assistance to encourage PO intake  COVIDEnterobacterHAP  Leukocytosis (resolved) -may need repeat COVID prior to dc to SNF, but if retest positive, unlikely to have active infection   ESRD on HDstable Secondary hyperparathyroidism -chronic Last HD on 12/27 per holiday schedule. Dialysis per nephro TTS schedule -Renal function TTS -CBC once weekly -Nephrology following, appreciate recs -Aranesp every Tuesday with HD -Senispar 30mg  daily, renvela 2.4g TID -Oxycodone sch prior to HD  Constipation-treating  -Miralax BID, Dulcolax, Senna  HFmEF CAD [facility] 04/27/19 40 to 45%.  LV with mild to moderate decreased fxn,mildly increased LVH,inferiolateral akinesis and anterolateral severe hypokinesis. Grade 2 diastolic dysfunction.  -Continue aspirin 81 -Continue Lipitor 80 mg daily -Coreg 6.25 mg twice daily  Hypertension  Tachycardia, stable  Vitals been stable.  BP 141/91 last night, last recorded. -Continue Coreg 6.25 mg twice daily   OSA with use of CPAP-stable OSA at baseline and uses CPAP intermittently. Now 21+ days out from Covid diagnosis, can use CPAP if needed.  Adamantly refuses CPAP overnight. -Continue to encourage patient to use CPAP at bedtime  Diabetes -stable cbgs <150 Home medications include glipizide 5 mg BID.Last A1c 5.8 on 04/14/2019.  -Continue to hold home glipizide -would consider discontinuing glipizide on dc to avoid hypoglycemia -Check  blood glucose on renal function panel  HLD Home medications include  atorvastatin 40mg  -Atorvastatin 80mg  daily  Diabetic Neuropathy  Patient on home gabapentin 300 mg TID. Patient reports only taking BID.  -continue to hold homegabapentin  Gout  Home medications: Allopurinol 100mg  daily and colchicine 0.6mg  BID as needed when symptomatic. -Continue home allopurinol  Social/Disposition Now considering LTAC for social work notes.  We will continue to follow. -Discharge to SNF vs LTAC - f/u SW  FEN/GI: Dysphagia 3 diet [id number]:UKGURKY 5000 units subq 3 times daily  Disposition: Medically ready for d/c.  SNF versus LTAC, follow-up social work  Subjective:  Patient doing well.  Sitting up in chair about to eat breakfast.  Knows that he had HD yesterday and won't get it today.  States he has been working hard with PT.  No complaints this AM.  Objective: Temp:  [97.8 F (36.6 C)-99.1 F (37.3 C)] 99.1 F (37.3 C) (12/27 2151) Pulse Rate:  [74-106] 94 (12/27 2151) Resp:  [16-19] 18 (12/27 2151) BP: (113-141)/(65-91) 141/91 (12/27 2151) SpO2:  [96 %-100 %] 98 % (12/27 2151) Weight:  [94.1 kg] 94.1 kg (12/28 0500)  Physical Exam:  General: 54 y.o. male in NAD, sitting up, eating breakfast  Cardio: RRR no m/r/g Lungs: CTAB, no wheezing, no rhonchi, no crackles, no IWOB on RA Abdomen: Soft, non-tender to palpation, non-distended, positive bowel sounds Skin: warm and dry Extremities: No edema Neuro: alert, oriented to self, location, circumstance, intention tremor BUE   Laboratory: Recent Labs  Lab 06/08/19 0010 06/10/19 0723 06/13/19 0809  WBC 10.1 7.5 7.5  HGB 10.5* 8.8* 8.2*  HCT 31.3* 28.0* 25.9*  PLT 389 388 354   Recent Labs  Lab 06/10/19 0815 06/12/19 0809 06/13/19 0223  NA 132* 137 135  K 4.7 4.4 4.4  CL 94* 98 96*  CO2 24 25 25   BUN 61* 65* 95*  CREATININE 9.50* 9.35* 10.84*  CALCIUM 10.3 10.4* 10.4*  GLUCOSE 116* 104* 127*    Imaging/Diagnostic Tests: No new images   [name], DO 06/14/2019,  8:18 AM PGY-2, [place] Intern pager: [phone], text pages welcome

## 2019-06-14 NOTE — Progress Notes (Signed)
Physical Therapy Treatment Patient Details Name: Troy Wells MRN: [id number] DOB: 1965/08/03 Today's Date: 06/14/2019    History of Present Illness 53 yo male presenting to [facility] with cardiac arrest and CPR initatied by EMS. Pt at dialysis, complained of SOB, and became unresponsive. Pt with prior hospital admission (10/27 - 11/3) for COVID associated pneumonia; active problems of acute hypoxemic respiratory failure NSTEMI during admission. Intubated 11/10-11/23. MRI showing bilateral medial thalami is suspicious for anoxic injury and chronic R PICA cerebellar infarct.  Transitioned off of CRRT 05/11/19, femoral line removed 05/13/19.PMH including ESRD on HD, chronic anemia, chronic systolic heart failure, CAD, HTN, OSA on CPAP, DM2, and HLD.    PT Comments    Pt eager to work with PT and transfer [place]. Pt continues with myotonic jerking movement of UEs and LEs especially when attempting to use UEs for task or move LEs. Pt did stand with minA today up into stedy and tolerate 2 bouts of marching in place x20 reps in stedy with minAx2. Pt emotional today regarding jerking movements and tasks being difficult to complete. Pt progressing towards all goals and continue to benefit from SNF upon d/c to maximize functional recovery.    Follow Up Recommendations  SNF     Equipment Recommendations  Hospital bed;Other (comment)    Recommendations for Other Services Rehab consult     Precautions / Restrictions Precautions Precautions: Fall Precaution Comments: myotonic jerking with movement, especially when trying to use UEs Restrictions Weight Bearing Restrictions: No    Mobility  Bed Mobility Overal bed mobility: Needs Assistance Bed Mobility: Rolling;Sidelying to Sit Rolling: Min assist Sidelying to sit: Mod assist;+2 for safety/equipment       General bed mobility comments: pt initiated rolling to the R, assist with hand placement to use bed rail due to severe jerking movements,  modA for trunk elevation and to block knees to prevent sliding of EOB due to jerking movements of LEs and UEs  Transfers Overall transfer level: Needs assistance Equipment used: Ambulation equipment used Transfers: Sit to/from Stand Sit to Stand: Min assist;+2 physical assistance Stand pivot transfers: Total assist       General transfer comment: pt able to power up well, minA for safety due to jerking movement and bilat knee instability  Ambulation/Gait             General Gait Details: pt unsafe to amb at this time but was able to tolerate 2 bouts of standing at 3 min and marching in place x 20 reps total. Pt initially very jerky and bilat knee instability however never required to sit, pt's jerking diminished by end of jerking but never ceased   Chief Strategy Officer    Modified Rankin (Stroke Patients Only) Modified Rankin (Stroke Patients Only) Pre-Morbid Rankin Score: No symptoms Modified Rankin: Severe disability     Balance Overall balance assessment: Needs assistance Sitting-balance support: Feet supported;Bilateral upper extremity supported Sitting balance-Leahy Scale: Poor Sitting balance - Comments: unpredictable due to tone of mm's   Standing balance support: Bilateral upper extremity supported;During functional activity Standing balance-Leahy Scale: Poor Standing balance comment: dependent on stedy                            Cognition Arousal/Alertness: Awake/alert Behavior During Therapy: WFL for tasks assessed/performed Overall Cognitive Status: Within Functional Limits for tasks assessed  General Comments: pt was emotional, began crying once sititng EOB stating "this just feels so good", pt then cried again due to constant jerking movements. Pt able to follow commands consistently, able to sequence stepping today      Exercises      General Comments General  comments (skin integrity, edema, etc.): did not observe sacral wound, did use ROHO in recliner. pt did blow nose and wash face while sitting in stedy prior to transfer to chair, pt wtih intense jerking of UEs requiring modAx2 to blow nose      Pertinent Vitals/Pain Pain Assessment: Faces Faces Pain Scale: No hurt Pain Intervention(s): Monitored during session    Home Living                      Prior Function            PT Goals (current goals can now be found in the care plan section) Acute Rehab PT Goals PT Goal Formulation: With patient Time For Goal Achievement: 06/28/19 Potential to Achieve Goals: Fair Progress towards PT goals: Progressing toward goals    Frequency    Min 3X/week      PT Plan Current plan remains appropriate    Co-evaluation              AM-PAC PT "6 Clicks" Mobility   Outcome Measure  Help needed turning from your back to your side while in a flat bed without using bedrails?: A Little Help needed moving from lying on your back to sitting on the side of a flat bed without using bedrails?: A Lot Help needed moving to and from a bed to a chair (including a wheelchair)?: A Lot Help needed standing up from a chair using your arms (e.g., wheelchair or bedside chair)?: A Lot Help needed to walk in hospital room?: Total Help needed climbing 3-5 steps with a railing? : Total 6 Click Score: 11    End of Session Equipment Utilized During Treatment: Gait belt Activity Tolerance: Patient tolerated treatment well;Treatment limited secondary to medical complications (Comment) Patient left: in chair;with call bell/phone within reach;with chair alarm set Nurse Communication: Mobility status PT Visit Diagnosis: Other abnormalities of gait and mobility (R26.89);Muscle weakness (generalized) (M62.81);Difficulty in walking, not elsewhere classified (R26.2);Pain     Time: 0752-0823 PT Time Calculation (min) (ACUTE ONLY): 31 min  Charges:   $Therapeutic Activity: 8-22 mins $Neuromuscular Re-education: 8-22 mins                     Kittie Plater, PT, DPT Acute Rehabilitation Services Pager #: [phone] Office #: [phone]    [place] Lopes 06/14/2019, 9:41 AM

## 2019-06-14 NOTE — TOC Progression Note (Signed)
Transition of Care [facility]) - Progression Note    Patient Details  Name: Troy Wells MRN: [id number] Date of Birth: [birth year]  Transition of Care [facility]) CM/SW Contact  Sharin Mons, RN Phone Number: [phone] 06/14/2019, 9:28 AM  Clinical Narrative:    Pt is ready medically for next level of care per MD: SNF vs LTAC. MD states pt can now sit for 3hrs.  NCM has left voice messages with pt's daughter, [facility] and [facility] to discuss pt's TOC needs  this am .... awaiting call back. NCM will continue to monitor.  Expected Discharge Plan: Skilled Nursing Facility(vs LTAC) Barriers to Discharge: No Barriers Identified  Expected Discharge Plan and Services Expected Discharge Plan: Skilled Nursing Facility(vs LTAC) In-house Referral: Clinical Social Work   Post Acute Care Choice: IP Rehab Living arrangements for the past 2 months: Apartment                 DME Arranged: N/A         HH Arranged: NA           Social Determinants of Health (SDOH) Interventions    Readmission Risk Interventions Readmission Risk Prevention Plan 05/17/2019  Transportation Screening Complete  Medication Review Press photographer) Complete  PCP or Specialist appointment within 3-5 days of discharge Complete  HRI or Home Care Consult Complete  SW Recovery Care/Counseling Consult Complete  [place] Complete  Some recent data might be hidden

## 2019-06-14 NOTE — Consult Note (Addendum)
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  Seen in dialysis chair; PT assisted in getting patient up at 8:30am. Bedside RN, Heloise Ochoa and Nursing Assistant Ria Comment assisted me in reclining chair and turning patient to the left side for reassessment of pressure injury. Their expertise and assistance is approbated. Patient is sitting upon a gel chair cushion. Measurement: 4cm x 3cm x 4cm Wound bed: 100% Red, moist Drainage (amount, consistency, odor) Small amount of light tan drainage on old dressing.No odor.  Periwound: With evidence of wound healing, i.e., contraction at periphery (scar tissue) Dressing procedure/placement/frequency: Patient sitting in chair on pressure redistribution cushion next to mattress replacement with low air loss feature that is in use when he is not in chair. Plastic surgery has signed off, general surgery has signed off.  Patient is able to tolerate sitting up in dialysis chair for a HD treatment without difficulty now.  (Had HD yesterday).  According to Hospitalist note, is stable for discharge to either SNF or LTAC ([facility] has been introduced as a possibility according to notes). Plan: Continue with twice daily NS moistened gauze covered with dry gauze and secured with silicone foam dressing. TUrn and reposition off of supine position and minimize time in the supine position.  Use pressure redistribution chair cushion when OOB to chair and shift position frequently.  [place] nursing team will continue to follow, seeing every 7-10 days and will remain available to this patient, the nursing and medical teams.    Thanks, Maudie Flakes, MSN, RN, [place], Arther Abbott  Pager# [phone]

## 2019-06-14 NOTE — Progress Notes (Signed)
Patient ID: Troy Wells, male   DOB: December 07, 1965, 53 y.o.   MRN: [id number] [facility] Progress Note   Assessment/ Plan:   1.  Recent COVID-19 pneumonia and status post PEA cardiac arrest with hypoxic/anoxic encephalopathy and consequent deconditioning/sacral decubitus ulcer: Ongoing wound care per instructions by plastic surgery earlier.  Last week was able to get dialysis in recliner and will attempt this again tomorrow. 2. ESRD: continue TTS schedule. Had HD yest, and will plan HD for tomorrow. Myoclonic jerking is better today.  Will continue HD in recliner, if this is tolerated then admission to SNF will be an option rather than LTAC.  3. Anemia: Status post PRBC transfusions and currently on high-dose ESA, will follow H/H trend. 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  Continue cinacalcet for PTH suppression. 5. Nutrition: Continue renal diet with ongoing protein supplementation to optimize wound healing. 6. Hypertension/volume: Blood pressures currently under acceptable control, monitor with ongoing antihypertensive therapy/hemodialysis. No edema on exam, down 30kg from prior edw.  7.  Memory problems: felt to due to ABI/ hx arrest. Seems to be improving overall.   Kelly Splinter, MD 06/14/2019, 9:24 AM   Subjective:   No c/o today, had HD yest    Objective:   BP (!) 141/91 (BP Location: Right Arm)   Pulse 94   Temp 99.1 F (37.3 C) (Oral)   Resp 18   Ht 5\' 10"  (1.778 m)   Wt 94.1 kg   SpO2 98%   BMI 29.77 kg/m   Physical Exam: Gen: Resting in bed, no myoclonic jerking CVS: Pulse regular rhythm, normal rate, S1 and S2 normal Resp: Anteriorly clear to auscultation, no rales/rhonchi Abd: Soft, flat, nontender Ext: No lower extremity edema, left upper arm arteriovenous graft with decent thrill    Outpt HD: TTS     4.5h  F250  124.5kg  400/800  2/2.25  Hep 12,000  - coming off 5-6 kg over dry  wt  - mircera 30 q4 last 10/13  - hect 4    Labs: BMET Recent Labs  Lab 06/08/19 0644 06/10/19 0815 06/12/19 0809 06/13/19 0223  NA 133* 132* 137 135  K 5.0 4.7 4.4 4.4  CL 90* 94* 98 96*  CO2 24 24 25 25   GLUCOSE 98 116* 104* 127*  BUN 76* 61* 65* 95*  CREATININE 11.11* 9.50* 9.35* 10.84*  CALCIUM 10.7* 10.3 10.4* 10.4*  PHOS 6.1* 5.1* 3.6 2.7   CBC Recent Labs  Lab 06/08/19 0010 06/10/19 0723 06/13/19 0809  WBC 10.1 7.5 7.5  HGB 10.5* 8.8* 8.2*  HCT 31.3* 28.0* 25.9*  MCV 81.9 87.2 88.1  PLT 389 388 354     Medications:    . acetaminophen  650 mg Oral Q4H  . allopurinol  100 mg Oral Daily  . aspirin  81 mg Oral Daily  . atorvastatin  80 mg Oral Daily  . carvedilol  6.25 mg Oral BID WC  . Chlorhexidine Gluconate Cloth  6 each Topical Q0600  . cinacalcet  30 mg Oral Q supper  . darbepoetin (ARANESP) injection - DIALYSIS  200 mcg Intravenous Q Thu-HD  . feeding supplement (NEPRO CARB STEADY)  237 mL Oral TID BM  . feeding supplement (PRO-STAT SUGAR FREE 64)  30 mL Oral TID  . heparin injection (subcutaneous)  5,000 Units Subcutaneous Q8H  . mirtazapine  15 mg Oral QHS  . multivitamin  1 tablet Oral QHS  . nutrition supplement (JUVEN)  1 packet Oral BID BM  . [START ON 06/15/2019] oxyCODONE  7.5 mg Oral Q T,Th,Sa-HD  . polyethylene glycol  17 g Oral BID  . senna  1 tablet Oral Daily  . sevelamer carbonate  2.4 g Oral TID WC

## 2019-06-14 NOTE — Progress Notes (Signed)
FPTS Interim Progress Note  S: Spoke with SW about possible bed at 2 different facilities. She spoke with the patient's daughter and the daughter will look over the places but plans to appeal the discharge. We have placed a COVID test for SNF placement.   Gerlene Fee, DO 06/14/2019, 4:29 PM PGY-1, [place] Medicine Service pager [phone]

## 2019-06-15 LAB — RENAL FUNCTION PANEL
Albumin: 2.6 g/dL — ABNORMAL LOW (ref 3.5–5.0)
Anion gap: 16 — ABNORMAL HIGH (ref 5–15)
BUN: 78 mg/dL — ABNORMAL HIGH (ref 6–20)
CO2: 30 mmol/L (ref 22–32)
Calcium: 10.8 mg/dL — ABNORMAL HIGH (ref 8.9–10.3)
Chloride: 91 mmol/L — ABNORMAL LOW (ref 98–111)
Creatinine, Ser: 9.27 mg/dL — ABNORMAL HIGH (ref 0.61–1.24)
GFR calc Af Amer: 7 mL/min — ABNORMAL LOW (ref >60)
GFR calc non Af Amer: 6 mL/min — ABNORMAL LOW (ref >60)
Glucose, Bld: 111 mg/dL — ABNORMAL HIGH (ref 70–99)
Phosphorus: 3.2 mg/dL (ref 2.5–4.6)
Potassium: 4.5 mmol/L (ref 3.5–5.1)
Sodium: 137 mmol/L (ref 135–145)

## 2019-06-15 MED ORDER — CARVEDILOL 6.25 MG PO TABS: 6.2500 mg | ORAL_TABLET | Freq: Two times a day (BID) | ORAL | Status: DC

## 2019-06-15 MED ORDER — MIRTAZAPINE 15 MG PO TABS: 15.0000 mg | ORAL_TABLET | Freq: Every day | ORAL | 0 refills | Status: DC

## 2019-06-15 MED ORDER — CLONAZEPAM 0.25 MG PO TBDP: 0.2500 mg | ORAL_TABLET | Freq: Two times a day (BID) | ORAL | 0 refills | Status: DC

## 2019-06-15 MED ORDER — CARVEDILOL 6.25 MG PO TABS: 6.2500 mg | ORAL_TABLET | Freq: Two times a day (BID) | ORAL | 0 refills | Status: DC

## 2019-06-15 MED ORDER — CLONAZEPAM 0.25 MG PO TBDP
0.2500 mg | ORAL_TABLET | Freq: Two times a day (BID) | ORAL | Status: DC
  Administered 2019-06-15 – 2019-06-16 (×2): 0.25 mg via ORAL
  Filled 2019-06-15 (×2): qty 1

## 2019-06-15 NOTE — Progress Notes (Signed)
Spoke to neurology regarding the patient's tremor at request of the patient and his daughter.  Neurology stated that this would likely be a long-term issue that needs to be followed up with outpatient neurology after the patient's previous cardiac arrest.  Neurology recommended against using baclofen due to patient's anoxic brain injury and the fact that it lowers the seizure threshold.  Neurology stated that a long-acting benzo or Keppra can be used in these cases.  Neurology recommended that the patient be started on clonazepam 0.25 twice daily and scheduled for close outpatient neurology follow-up within the next 2-4 weeks upon discharge.  Also spoke with patient's daughter at request case management as she was uneasy about the patient being discharged to skilled nursing facility with his tremor.  Patient's daughter was very understanding about the cause of the tremor and the fact that this will likely be a long-term issue that needs to be followed up with outpatient neurology.  Patient's daughter stated she would be okay with him discharging to this SNF as long as he is starting a medication for this tremor and will be followed with outpatient neurology.

## 2019-06-15 NOTE — Progress Notes (Signed)
Patient ID: Troy Wells, male   DOB: Aug 01, 1965, 53 y.o.   MRN: [id number] [facility] Progress Note   Assessment/ Plan:   1.  Recent COVID-19 pneumonia and status post PEA cardiac arrest with hypoxic/anoxic encephalopathy and consequent deconditioning/sacral decubitus ulcer: Ongoing wound care per instructions by plastic surgery earlier. For dc to SNF today after HD.  2. ESRD: continue TTS schedule. Due for HD today prior to DC to SNF. Has been dialyzing in a chair several times here successfully.  3. Anemia: Status post PRBC transfusions and currently on high-dose ESA, will follow H/H trend. 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  Continue cinacalcet for PTH suppression. 5. Nutrition: Continue renal diet with ongoing protein supplementation to optimize wound healing. 6. Hypertension/volume: Blood pressures currently under acceptable control, monitor with ongoing antihypertensive therapy/hemodialysis. No edema on exam, down 30kg from prior dry wt 7.  Memory problems: felt to due to ABI/ hx arrest. Seems to be improving overall.   Kelly Splinter, MD 06/15/2019, 12:22 PM   Subjective:   No c/o today, had HD yest    Objective:   BP 128/82 (BP Location: Right Arm)   Pulse 81   Temp 98.1 F (36.7 C) (Oral)   Resp 14   Ht 5\' 10"  (1.778 m)   Wt 97 kg   SpO2 98%   BMI 30.68 kg/m   Physical Exam: Gen: Resting in bed, no myoclonic jerking CVS: Pulse regular rhythm, normal rate, S1 and S2 normal Resp: Anteriorly clear to auscultation, no rales/rhonchi Abd: Soft, flat, nontender Ext: No lower extremity edema, left upper arm arteriovenous graft with decent thrill    Outpt HD: TTS     4.5h  F250  124.5kg  400/800  2/2.25  Hep 12,000  - coming off 5-6 kg over dry wt  - mircera 30 q4 last 10/13  - hect 4    Labs: BMET Recent Labs  Lab 06/10/19 0815 06/12/19 0809 06/13/19 0223 06/15/19 0343  NA  132* 137 135 137  K 4.7 4.4 4.4 4.5  CL 94* 98 96* 91*  CO2 24 25 25 30   GLUCOSE 116* 104* 127* 111*  BUN 61* 65* 95* 78*  CREATININE 9.50* 9.35* 10.84* 9.27*  CALCIUM 10.3 10.4* 10.4* 10.8*  PHOS 5.1* 3.6 2.7 3.2   CBC Recent Labs  Lab 06/10/19 0723 06/13/19 0809  WBC 7.5 7.5  HGB 8.8* 8.2*  HCT 28.0* 25.9*  MCV 87.2 88.1  PLT 388 354     Medications:    . acetaminophen  650 mg Oral Q4H  . allopurinol  100 mg Oral Daily  . aspirin  81 mg Oral Daily  . atorvastatin  80 mg Oral Daily  . carvedilol  6.25 mg Oral BID WC  . Chlorhexidine Gluconate Cloth  6 each Topical Q0600  . Chlorhexidine Gluconate Cloth  6 each Topical Q0600  . cinacalcet  30 mg Oral Q supper  . clonazepam  0.25 mg Oral BID  . darbepoetin (ARANESP) injection - DIALYSIS  200 mcg Intravenous Q Thu-HD  . feeding supplement (NEPRO CARB STEADY)  237 mL Oral TID BM  . feeding supplement (PRO-STAT SUGAR FREE 64)  30 mL Oral TID  . heparin injection (subcutaneous)  5,000 Units Subcutaneous Q8H  . mirtazapine  15 mg Oral QHS  . multivitamin  1 tablet Oral QHS  . nutrition supplement (JUVEN)  1 packet Oral BID BM  . oxyCODONE  7.5 mg Oral Q  T,Th,Sa-HD  . polyethylene glycol  17 g Oral BID  . senna  1 tablet Oral Daily  . sevelamer carbonate  2.4 g Oral TID WC

## 2019-06-15 NOTE — Progress Notes (Signed)
Called report to [facility] SNF from the number provided by Case Management

## 2019-06-15 NOTE — Discharge Instructions (Signed)
Dear Troy Wells,  Thank you for letting us participate in your care.   POST-HOSPITAL & CARE INSTRUCTIONS 1. Stop taking: Amlodipine, gabapentin, glipizide, linagliptin, minoxidil and torsemide. 2. Follow-up with PCP after SNF stay. 3. Start taking clonazepam 0.25 mg twice per day to help with your tremor 4. We have sent a referral for outpatient neurology, they will contact you and we will schedule appointments to continue to monitor your tremor on the new medication.   Take care and be well!  [facility]  [place], [place] [address] [phone]

## 2019-06-15 NOTE — Progress Notes (Addendum)
Family Medicine Teaching Service Daily Progress Note Intern Pager: [phone]  Patient name: Troy Wells Medical record number: [id number] Date of birth: [birth year] Age: 53 y.o. Gender: male  Primary Care Provider: [name], MD Consultants: Neurology (signed off), General Surgery (signed off), Plastic Surgery (signed off), critical care (signed off), Cardiology (Signed off), nephrology Code Status: Full   Pt Overview and Major Events to Date:  04/27/2019 admitted to ICU 05/02/2019 MRI brain showing anoxic brain injury 05/10/2019: Extubated 05/12/2019: Started receiving care under FPTS 05/20/2019: EEG w/o seizure, Neuro signed off 12/14 surgery consulted for sacral ulcer, signed of 12/21 Plastic surgery consulted for sacral ulcer, signed off  Assessment and Plan: Troy Wells is a 53 y.o. male transferred from ICU after cardiac arrest with recent history of COVID-19 infection.  PMH significant for ESRD MWF on HD, OSA, HTN, HLD, HFpEF, pHTN, type 2 diabetes, and diabetic neuropathy.  Encephalopathy  anoxic brain injury s/p PEA cardiac arrest: stable Anoxic brain injury on MRI from 11/5.  Patient is medically ready and stable for discharge to SNF as he is tolerating sitting in chair for 3 hours for HD. - PT/OT continue to eval and treat -Intermittent myoclonic jerking still continues - dispo pending, SNF vs LTAC - Medically stable for d/c  LUE/sacral ulcer/debility- stable Not a candidate for a skin graft per plastic surgery. Routine wound care from wound care team per general and plastic surgery. - continue to maximize offloading of pressure from sacral wound with rotation - wound care following appreciate recs - tylenol prn - up in chair as tolerated, goal > 4 hours per day - cont scheduled oxy 7.5mg  qTTS to give 1 hr before HD, also ordered 1 time dose oxy 7.5mg  for 12/27, given holiday schedule HD  Protein calorie malnutrition-stable Improving slowly. Nutrition  Consulted. Higher protein content may help with wound healing. 100% of meal.  Albumin 2.6 on 12/29, pre-alb 37 on 12/16. -Encourage PO intake -RN to document % of food eaten at each meal  -Mirtazapine 15mg  once daily, for appetite stimulation -Meals with assistance to encourage PO intake  COVIDEnterobacterHAP  Leukocytosis (resolved) -May need repeat COVID prior to dc to SNF, but if retest positive, unlikely to have active infection   ESRD on HDstable Secondary hyperparathyroidism -chronic Last HD on 12/27 per holiday schedule. Dialysis per nephro TTS schedule -Renal function TTS -CBC once weekly -Nephrology following, appreciate recs -Aranesp every Tuesday with HD -Senispar 30mg  daily, renvela 2.4g TID -Oxycodone sch prior to HD  Constipation -Miralax BID, Dulcolax, Senna  HFmEF CAD [facility] 04/27/19 40 to 45%.  LV with mild to moderate decreased fxn,mildly increased LVH,inferiolateral akinesis and anterolateral severe hypokinesis. Grade 2 diastolic dysfunction.  -Continue aspirin 81 -Continue Lipitor 80 mg daily -Coreg 6.25 mg twice daily  Hypertension  Tachycardia, stable  Vitals been stable.  BP 141/91 last night, last recorded. -Continue Coreg 6.25 mg twice daily   OSA with use of CPAP-stable OSA at baseline and uses CPAP intermittently. Now 21+ days out from Covid diagnosis, can use CPAP if needed.  Adamantly refuses CPAP overnight. -Continue to encourage patient to use CPAP at bedtime  Diabetes -stable cbgs <150 Home medications include glipizide 5 mg BID.Last A1c 5.8 on 04/14/2019.  -Continue to hold home glipizide -would consider discontinuing glipizide on dc to avoid hypoglycemia -Check blood glucose on renal function panel  HLD Home medications - atorvastatin 40mg  -Cont Atorvastatin 80mg  daily  Diabetic Neuropathy  Patient on home gabapentin 300 mg TID. Patient reports only  taking BID.  -Cont. to hold homegabapentin  Gout  Home  medications: Allopurinol 100mg  daily and colchicine 0.6mg  BID as needed when symptomatic. -Continue home allopurinol  Social/Disposition Now considering LTAC for social work notes.  We will continue to follow. -Discharge to SNF vs LTAC - f/u SW  FEN/GI: Dysphagia 3 diet DJM:EQASTMH 5000 units subq 3 times daily  Disposition: Medically ready for d/c.  SNF versus LTAC, follow-up social work  Subjective:  Patient continues to be medically stable for discharge.  Patient denies complaints or concerns this morning.  Objective: Temp:  [98.1 F (36.7 C)-98.8 F (37.1 C)] 98.1 F (36.7 C) (12/28 2331) Pulse Rate:  [84-96] 84 (12/28 2331) Resp:  [15-16] 15 (12/28 2331) BP: (144-153)/(85-88) 149/88 (12/28 2331) SpO2:  [98 %-100 %] 100 % (12/28 2331)  Physical Exam:  General: Alert and oriented in no apparent distress, intention tremor present in BUE Heart: Regular rate and rhythm with no murmurs appreciated Lungs: CTA bilaterally, no wheezing Skin: Warm and dry  Laboratory: Recent Labs  Lab 06/10/19 0723 06/13/19 0809  WBC 7.5 7.5  HGB 8.8* 8.2*  HCT 28.0* 25.9*  PLT 388 354   Recent Labs  Lab 06/12/19 0809 06/13/19 0223 06/15/19 0343  NA 137 135 137  K 4.4 4.4 4.5  CL 98 96* 91*  CO2 25 25 30   BUN 65* 95* 78*  CREATININE 9.35* 10.84* 9.27*  CALCIUM 10.4* 10.4* 10.8*  GLUCOSE 104* 127* 111*    Imaging/Diagnostic Tests: No new images   Lurline Del, DO 06/15/2019, 5:59 AM PGY-1, [place] Intern pager: [phone], text pages welcome

## 2019-06-15 NOTE — TOC Transition Note (Addendum)
Transition of Care [facility]) - CM/SW Discharge Note   Patient Details  Name: Troy Wells MRN: [id number] Date of Birth: 12-23-1965  [facility]) CM/SW Contact:  Sharin Mons, RN Phone Number: 06/15/2019, 5:20 PM   Clinical Narrative:    Patient will DC to: [facility] SNF Anticipated DC date: 06/15/2019 Family notified: Marijo Conception (daughter)  Transport by: Corey Harold   Per MD patient ready for DC today. Pt will resume outpatient HD on tomorrow seat time 6am and Sat. 6am. Regular outpatient HD schedule MWF will resume Jan.4th per renal navigator nurse.  RN, patient, patient's family, and facility notified of DC. Discharge Summary and FL2 sent to facility. RN to call report prior to discharge [phone]). DC packet on chart. Ambulance transport / [facility] [phone]) will need to be requested for patient.   RNCM will sign off for now as intervention is no longer needed. Please consult [place] again if new needs arise.    Final next level of care: Skilled Nursing Facility Barriers to Discharge: No Barriers Identified   Patient Goals and CMS Choice   CMS Medicare.gov Compare Post Acute Care list provided to:: Patient Represenative (must comment)(Daughter) Choice offered to / list presented to : Adult Children  Discharge Placement                       Discharge Plan and Services In-house Referral: Clinical Social Work   Post Acute Care Choice: IP Rehab          DME Arranged: N/A         HH Arranged: NA          Social Determinants of Health (SDOH) Interventions     Readmission Risk Interventions Readmission Risk Prevention Plan 05/17/2019  Transportation Screening Complete  Medication Review Press photographer) Complete  PCP or Specialist appointment within 3-5 days of discharge Complete  HRI or Home Care Consult Complete  SW Recovery Care/Counseling Consult Complete  Palliative Care Screening Not [place] Complete   Some recent data might be hidden

## 2019-06-16 MED ORDER — SENNA 8.6 MG PO TABS: 1.0000 | ORAL_TABLET | Freq: Every day | ORAL | 0 refills | Status: DC

## 2019-06-16 MED ORDER — POLYETHYLENE GLYCOL 3350 17 G PO PACK: 17.0000 g | PACK | Freq: Two times a day (BID) | ORAL | 0 refills | Status: DC

## 2019-06-16 MED ORDER — CARVEDILOL 6.25 MG PO TABS: 6.2500 mg | ORAL_TABLET | Freq: Two times a day (BID) | ORAL | 0 refills | Status: DC

## 2019-06-16 NOTE — Progress Notes (Signed)
Physical Therapy Treatment Patient Details Name: Troy Wells MRN: [id number] DOB: Jan 02, 1966 Today's Date: 06/16/2019    History of Present Illness 53 yo male presenting to [facility] with cardiac arrest and CPR initatied by EMS. Pt at dialysis, complained of SOB, and became unresponsive. Pt with prior hospital admission (10/27 - 11/3) for COVID associated pneumonia; active problems of acute hypoxemic respiratory failure NSTEMI during admission. Intubated 11/10-11/23. MRI showing bilateral medial thalami is suspicious for anoxic injury and chronic R PICA cerebellar infarct.  Transitioned off of CRRT 05/11/19, femoral line removed 05/13/19.PMH including ESRD on HD, chronic anemia, chronic systolic heart failure, CAD, HTN, OSA on CPAP, DM2, and HLD.    PT Comments    Pt making excellent progress today. Pt able to amb 10' with RW and modAx2. Pt with continued myotonic jerking however significantly less than previous treatment. Pt participated in multiple stands and standing & seated activities at the sink with OT. Con't to recommend ST-SNF upon d/c to maximize functional return and progress mobility. Acute PT to cont to follow.    Follow Up Recommendations  SNF     Equipment Recommendations  Hospital bed;Other (comment)    Recommendations for Other Services Rehab consult     Precautions / Restrictions Precautions Precautions: Fall Precaution Comments: myotonic jerking with movement, especially when trying to use UEs Restrictions Weight Bearing Restrictions: No    Mobility  Bed Mobility Overal bed mobility: Needs Assistance Bed Mobility: Supine to Sit Rolling: Min assist Sidelying to sit: Mod assist Supine to sit: Mod assist     General bed mobility comments: pt brought LE off EOB, modA for trunk elevation  Transfers Overall transfer level: Needs assistance Equipment used: Ambulation equipment used;Rolling walker (2 wheeled) Transfers: Sit to/from Stand Sit to Stand: Mod  assist;Min assist;+2 physical assistance;From elevated surface Stand pivot transfers: (used stedy for safety)       General transfer comment: mod assist +2 from EOB and recliner, min assist +2 from elevated surface; cueing for hand placement and safety   Ambulation/Gait Ambulation/Gait assistance: Mod assist;+2 safety/equipment Gait Distance (Feet): 10 Feet Assistive device: Rolling walker (2 wheeled) Gait Pattern/deviations: Step-to pattern;Decreased stride length;Narrow base of support Gait velocity: dec Gait velocity interpretation: <1.8 ft/sec, indicate of risk for recurrent falls General Gait Details: pt able to pick up and clear feet, pt with noted myotonic jerking but not nearly as bad as previous treatment session, pt with increased bilat UE dependence to brace self from jerking, pt reports "man i'm getting wore out" modA for walker management and to steady pt, 2nd person for very close chair follow   Stairs             Wheelchair Mobility    Modified Rankin (Stroke Patients Only) Modified Rankin (Stroke Patients Only) Pre-Morbid Rankin Score: No symptoms Modified Rankin: Severe disability     Balance Overall balance assessment: Needs assistance Sitting-balance support: Feet supported;No upper extremity supported Sitting balance-Leahy Scale: Poor Sitting balance - Comments: unsuppported sitting min guard for safety    Standing balance support: Single extremity supported;Bilateral upper extremity supported;During functional activity Standing balance-Leahy Scale: Poor Standing balance comment: dependent on 1 UE support statically and BUE dynamically                            Cognition Arousal/Alertness: Awake/alert Behavior During Therapy: WFL for tasks assessed/performed Overall Cognitive Status: Impaired/Different from baseline Area of Impairment: Memory;Problem solving  Memory: Decreased short-term memory Following  Commands: Follows one step commands consistently   Awareness: Emergent Problem Solving: Slow processing;Difficulty sequencing;Requires verbal cues General Comments: pt more alert and insightful, pt eager to progress mobility      Exercises Other Exercises Other Exercises: Completed 10 reps 1 set unilaterally of shoulder flexion, forward press, and diagonal punches using orange theraband    General Comments General comments (skin integrity, edema, etc.): noted sacral wound but did not observe      Pertinent Vitals/Pain Pain Assessment: No/denies pain Faces Pain Scale: No hurt    Home Living                      Prior Function            PT Goals (current goals can now be found in the care plan section) Acute Rehab PT Goals Patient Stated Goal: to get stronger Progress towards PT goals: Progressing toward goals    Frequency    Min 3X/week      PT Plan Current plan remains appropriate    Co-evaluation PT/OT/SLP Co-Evaluation/Treatment: Yes Reason for Co-Treatment: For patient/therapist safety PT goals addressed during session: Mobility/safety with mobility OT goals addressed during session: ADL's and self-care      AM-PAC PT "6 Clicks" Mobility   Outcome Measure  Help needed turning from your back to your side while in a flat bed without using bedrails?: A Little Help needed moving from lying on your back to sitting on the side of a flat bed without using bedrails?: A Lot Help needed moving to and from a bed to a chair (including a wheelchair)?: A Lot Help needed standing up from a chair using your arms (e.g., wheelchair or bedside chair)?: A Lot Help needed to walk in hospital room?: A Lot Help needed climbing 3-5 steps with a railing? : Total 6 Click Score: 12    End of Session Equipment Utilized During Treatment: Gait belt Activity Tolerance: Patient tolerated treatment well Patient left: in chair;with call bell/phone within reach;with chair  alarm set Nurse Communication: Mobility status PT Visit Diagnosis: Other abnormalities of gait and mobility (R26.89);Muscle weakness (generalized) (M62.81);Difficulty in walking, not elsewhere classified (R26.2);Pain     Time: 3244-0102 PT Time Calculation (min) (ACUTE ONLY): 33 min  Charges:  $Gait Training: 8-22 mins                     Troy Wells, PT, DPT Acute Rehabilitation Services Pager #: [phone] Office #: [phone]    Troy Wells 06/16/2019, 12:56 PM

## 2019-06-16 NOTE — Progress Notes (Signed)
Occupational Therapy Treatment Patient Details Name: Troy Wells MRN: [id number] DOB: 1966-05-06 Today's Date: 06/16/2019    History of present illness 53 yo male presenting to [facility] with cardiac arrest and CPR initatied by EMS. Pt at dialysis, complained of SOB, and became unresponsive. Pt with prior hospital admission (10/27 - 11/3) for COVID associated pneumonia; active problems of acute hypoxemic respiratory failure NSTEMI during admission. Intubated 11/10-11/23. MRI showing bilateral medial thalami is suspicious for anoxic injury and chronic R PICA cerebellar infarct.  Transitioned off of CRRT 05/11/19, femoral line removed 05/13/19.PMH including ESRD on HD, chronic anemia, chronic systolic heart failure, CAD, HTN, OSA on CPAP, DM2, and HLD.   OT comments  Patient supine in bed and eager to participate in OT/PT session. Completed supine theraband exercises with BUEs, then engaged in functional mobility. Bed mobility with mod assist, grooming at sink in steady with min-min guard assist educating patient on compensatory techniques with stabilization due to myotonic jerking movements, LB dressing with max assist +2.  Transfers with min-mod assist +2 for safety, steadying and physical support.  Will follow acutely, plan to dc to SNF today.    Follow Up Recommendations  SNF;Supervision/Assistance - 24 hour    Equipment Recommendations  Wheelchair (measurements OT);Wheelchair cushion (measurements OT)(ROHO )    Recommendations for Other Services      Precautions / Restrictions Precautions Precautions: Fall Precaution Comments: myotonic jerking with movement, especially when trying to use UEs Restrictions Weight Bearing Restrictions: No       Mobility Bed Mobility Overal bed mobility: Needs Assistance Bed Mobility: Rolling;Sidelying to Sit Rolling: Min assist Sidelying to sit: Mod assist       General bed mobility comments: pt transitioned to L side with min assist, required  support of BLEs to EOB and min assist for trunk  Transfers Overall transfer level: Needs assistance Equipment used: Ambulation equipment used;Rolling walker (2 wheeled) Transfers: Sit to/from Stand Sit to Stand: Mod assist;Min assist;+2 physical assistance;From elevated surface         General transfer comment: mod assist +2 from EOB and recliner, min assist +2 from elevated surface; cueing for hand placement and safety     Balance Overall balance assessment: Needs assistance Sitting-balance support: Feet supported;No upper extremity supported Sitting balance-Leahy Scale: Poor Sitting balance - Comments: unsuppported sitting min guard for safety    Standing balance support: Single extremity supported;Bilateral upper extremity supported;During functional activity Standing balance-Leahy Scale: Poor Standing balance comment: dependent on 1 UE support statically and BUE dynamically                           ADL either performed or assessed with clinical judgement   ADL Overall ADL's : Needs assistance/impaired     Grooming: Wash/dry face;Oral care;Min guard;Sitting;Standing Grooming Details (indicate cue type and reason): patient seated in stedy at sink for grooming, min guard for balance seated; min assist balance standing with heavy relaince on at least 1 UE support and standing for 2-3 minutes only              Lower Body Dressing: Maximal assistance;+2 for physical assistance;Sit to/from stand Lower Body Dressing Details (indicate cue type and reason): to don shoes, +2 to stand  Toilet Transfer: Total assistance;+2 for physical assistance Toilet Transfer Details (indicate cue type and reason): simulated to chair with stedy         Functional mobility during ADLs: Moderate assistance;+2 for physical assistance;+2 for safety/equipment  Vision       Perception     Praxis      Cognition Arousal/Alertness: Awake/alert Behavior During Therapy: WFL  for tasks assessed/performed Overall Cognitive Status: Impaired/Different from baseline Area of Impairment: Memory;Problem solving                     Memory: Decreased short-term memory       Problem Solving: Slow processing;Difficulty sequencing;Requires verbal cues          Exercises Exercises: Other exercises Other Exercises Other Exercises: Completed 10 reps 1 set unilaterally of shoulder flexion, forward press, and diagonal punches using orange theraband   Shoulder Instructions       General Comments      Pertinent Vitals/ Pain       Pain Assessment: Faces Faces Pain Scale: No hurt  Home Living                                          Prior Functioning/Environment              Frequency  Min 2X/week        Progress Toward Goals  OT Goals(current goals can now be found in the care plan section)  Progress towards OT goals: Progressing toward goals  Acute Rehab OT Goals Patient Stated Goal: to get stronger OT Goal Formulation: With patient  Plan Discharge plan remains appropriate    Co-evaluation    PT/OT/SLP Co-Evaluation/Treatment: Yes Reason for Co-Treatment: For patient/therapist safety;To address functional/ADL transfers   OT goals addressed during session: ADL's and self-care      AM-PAC OT "6 Clicks" Daily Activity     Outcome Measure   Help from another person eating meals?: A Little Help from another person taking care of personal grooming?: A Little Help from another person toileting, which includes using toliet, bedpan, or urinal?: A Lot Help from another person bathing (including washing, rinsing, drying)?: A Lot Help from another person to put on and taking off regular upper body clothing?: A Little Help from another person to put on and taking off regular lower body clothing?: A Lot 6 Click Score: 15    End of Session Equipment Utilized During Treatment: Rolling walker  OT Visit Diagnosis:  Unsteadiness on feet (R26.81);Other abnormalities of gait and mobility (R26.89);Muscle weakness (generalized) (M62.81)   Activity Tolerance Patient tolerated treatment well   Patient Left in chair;with call bell/phone within reach;with chair alarm set   Nurse Communication Mobility status        Time: 7414-2395 OT Time Calculation (min): 34 min  Charges: OT General Charges $OT Visit: 1 Visit OT Treatments $Self Care/Home Management : 8-22 mins  Jolaine Artist, OT [place] Pager [phone] Office [phone]    Delight Stare 06/16/2019, 12:43 PM

## 2019-06-16 NOTE — Progress Notes (Signed)
DC postponed from yesterday to today.  We will arrange for OP HD tomorrow (for today) and then Sat on holiday schedule then will resume MWF next week.   Kelly Splinter, MD 06/16/2019, 10:06 AM

## 2019-06-16 NOTE — Progress Notes (Signed)
Patient has been given a 6:00am chair time at OP HD clinic/[facility] tomorrow 06/17/19. He needs to arrive at 5:40am. CM/A. Landry Mellow updated, who will notify SNF. Patient is cleared for discharge from OP HD standpoint.  Alphonzo Cruise, [place] Renal Navigator [phone]

## 2019-06-16 NOTE — Progress Notes (Signed)
Renal Navigator has spoken with OP HD clinic[facility] to arrange OP HD treatment for tomorrow since d/c was postponed from yesterday to today--still awaiting a call back regarding treatment time, though clinic states they will accommodate patient tomorrow.  Alphonzo Cruise, [place] Renal Navigator [phone]

## 2019-06-16 NOTE — Discharge Summary (Signed)
[facility] Discharge Summary  Patient name: Troy Wells Medical record number: [id number] Date of birth: Sep 15, 1965 Age: 53 y.o. Gender: male Date of Admission: 04/27/2019   Date of Discharge:06/15/2019 Admitting Physician: Chi Rodman Pickle, MD  Primary Care Provider: [name], MD Consultants: CCM, neurology, nephrology,  Indication for Hospitalization: Cardiac arrest  Discharge Diagnoses/Problem List: Encephalopathy Anoxic brain injury PEA cardiac arrest Covid Enterobacter HAP ESRD HFmEF CAD Hypertension OSA Diabetes Hyperlipidemia Gout Sacral ulcer  Disposition: [facility]  Discharge Condition:  Stable  Discharge Exam:   General: Appears well, no acute distress.  Sitting up in bed, pleasant, appears comfortable Cardiac: RRR, normal heart sounds, no murmurs Respiratory: CTAB, normal effort Extremities: No edema or cyanosis.  Well perfused Skin: Warm and dry, no rashes noted Neuro: alert and oriented, no focal deficits Psych: appropriate mood and affect  Brief Hospital Course:   ICU Overview  Patient had recently been hospitalized for Covid pneumonia discharged on November 3.  On November 10, the patient arrived to the emergency department from his dialysis center where he was complaining of shortness of breath and was found to be hypoxemic and bradycardic.  During initial transport to the ED he went into PEA arrest and was resuscitated after 30 minutes of CPR.  Patient was immediately transferred to ICU where he remained intubated and sedated for several days.  Patient had twitching concerning for seizure and neurology was consulted.  Patient was observed on continuous EEG which did not show seizure activity but did show nonspecific profound diffuse encephalopathy.  MRI of his brain did not show any acute abnormalities.  Patient was started on Keppra.  The patient's acute respiratory distress was believed to be  multifactorial including Covid infection, possible bacterial infection, and hypervolemia from ESRD.  He received Vancomycin and Zosyn, which was then switched to ceftriaxone for a total course of 7 days. Patient met ARDS criteria during hospitalization.  Patient remained intubated and sedated until 11/23.  After he was weaned off sedation patient was able to respond to voice and commands and answer yes/no questions, but exhibited some continued confusion/delayed mentation.  Patient continue to receive renal replacement therapy throughout his stay in the ICU.  He was transitioned from CRRT to HD on 11/24.  Patient was transferred from ICU to family medicine teaching service on 11/25.  Acute encephalopathy  Possible anoxic brain injury Following neurology recommendations, we continued supportive care this patient has a lengthy recovery however outcome is favorable.  Keppra was discontinued on 05/12/19 as he has no known seizure disorder. Repeat ammonia normalized. SLP evaluated patient and diet was advanced to dysphagia 3.  PT and OT evaluated patient and recommended inpatient rehab. 12/01: Episode of somnolence resolved. 12/02 but dangled oversized bed unwitnessed. CT head showed No acute intracranial abnormality. 12/03 concern for seizure like activity EEG w/o seizure activity. Neuro signed off. Medically stable for discharge. Neuorlogically plateaued. Attempted chair sitting for outpatient dialysis and was achieved. Family meeting 12/09, 12/12 to discuss goals of care. Continued with daily PT during admission until discharge.  COVID  Leukocytosis Patient was cleared by infectious disease for removal of contact and airborne precaution.  Leukocytosis downtrending. 12/02 febrile CXR showed Left lower lobe airspace opacity compatible with pneumonia. Cardiac enlargement without heart failure. Blood cultures without growth for 5 days. Did not require abx tx. COVID negative 05/24/2019. Repeat COVID negative on  06/14/2019.  ESRD Nephrology followed during his stay.  Patient resumed on dialysis on 05/14/19 and had  regularly scheduled hemodialysis thereafter.  LUE/sacral ulcer 2/2 to being bed bound in ICU. Wounds covered with clean guaze with daily dressing changes. Hydrotherapy started 12/8 and required up to 7.74m Oxy IR for pain during hydrotherapy. Patient was turned every 2 hours. Patient did not tolerate hydrotherapy well so gen surgery was consulted for possible wound vac. Gen surgery recommendations included possible wound vac placement in Wound Care agreed.  Wound care noted that patient would not be a good candidate for wound vac given frequent stool incontinence, therefore this was not ordered. Plastic surgery was also consulted and noted that no surgical correction could be made due to location.  Nutrition was optimized.  Patient was given scheduled oxycodone 7.537m prior to HD.  Was able to tolerate sitting up in a chair for >4 hours prior to discharge.  Wound care instructions by wound care RN: Plan: Continue with twice daily NS moistened gauze covered with dry gauze and secured with silicone foam dressing. TUrn and reposition off of supine position and minimize time in the supine position.  Use pressure redistribution chair cushion when OOB to chair and shift position frequently.  Wound care instructions by plastic surgeon: Recommend continued use of air mattress. Continue with frequent turning of patient. Continue to strive to optimize nutritional status for wound healing including high protein intake. Protein shakes may be beneficial. Continue with current wound care.    Bilateral upper extremity tremor Patient with bilateral upper extremity tremor noted since his cardiac arrest.  Patient's daughter was initially very uncomfortable patient being discharged to skilled nursing facility while he had his bilateral upper extremity tremor.  After speaking with neurology they recommended scheduling  close outpatient follow-up as this would likely be a long-term issue caused by an anoxic brain injury.  Neurology also recommended starting patient on clonazepam 0.25 twice daily which can then be titrated up as needed.  Patient was started on this medication prior to discharge to skilled nursing facility and ambulatory referral to outpatient neurology was placed per their request.  Issues for Follow Up:  1. Stopped Amlodipine, gabapentin, glipizide, linagliptin, minoxidil and torsemide. Did not require during stay. 2. Needs daily wound care for sacral wound. 3. Dialysis TTS.  Consider oxycodone for pain prior to HD TTS given his sacral ulcer causing significant pain. 4. CPAP at night is encouraged 5. Neurology follow-up for bilateral upper extremity tremor, patient started on clonazepam 0.25 mg twice daily per neurology prior to discharge. 6. Continued wound care as above  Significant Procedures: ETT on 11/10; right IJ on 11/14 EEG: w/o seizure or epileptiform activity  Significant Labs and Imaging:  Recent Labs  Lab 06/10/19 0723 06/13/19 0809  WBC 7.5 7.5  HGB 8.8* 8.2*  HCT 28.0* 25.9*  PLT 388 354   Recent Labs  Lab 06/10/19 0815 06/12/19 0809 06/13/19 0223 06/15/19 0343  NA 132* 137 135 137  K 4.7 4.4 4.4 4.5  CL 94* 98 96* 91*  CO2 '24 25 25 30  ' GLUCOSE 116* 104* 127* 111*  BUN 61* 65* 95* 78*  CREATININE 9.50* 9.35* 10.84* 9.27*  CALCIUM 10.3 10.4* 10.4* 10.8*  PHOS 5.1* 3.6 2.7 3.2  ALBUMIN 2.7* 2.7* 2.6* 2.6*   Media Information   Document Information  Photos  05/25/2019  05/25/2019 12:42  Attached To:  Hospital Encounter on 04/27/19  Source Information  [facility]  [facility]    Results/Tests Pending at Time of Discharge: N/a  Discharge Medications:  Allergies as  of 06/16/2019   No Known Allergies     Medication List    STOP taking these medications   amLODipine 10 MG tablet Commonly known as: NORVASC   gabapentin 300  MG capsule Commonly known as: NEURONTIN   glipiZIDE 5 MG tablet Commonly known as: GLUCOTROL   linagliptin 5 MG Tabs tablet Commonly known as: TRADJENTA   minoxidil 10 MG tablet Commonly known as: LONITEN   torsemide 100 MG tablet Commonly known as: DEMADEX     TAKE these medications   albuterol 108 (90 Base) MCG/ACT inhaler Commonly known as: VENTOLIN HFA Inhale 1-2 puffs into the lungs every 6 (six) hours as needed for wheezing or shortness of breath.   allopurinol 100 MG tablet Commonly known as: ZYLOPRIM Take 100 mg by mouth 2 (two) times daily.   aspirin 81 MG EC tablet Take 81 mg by mouth daily.   atorvastatin 80 MG tablet Commonly known as: LIPITOR Take 1 tablet (80 mg total) by mouth daily.   Auryxia 1 GM 210 MG(Fe) tablet Generic drug: ferric citrate Take 420-840 mg by mouth See admin instructions. 840 mg with meals, and 420 mg with snacks   carvedilol 6.25 MG tablet Commonly known as: COREG Take 1 tablet (6.25 mg total) by mouth 2 (two) times daily with a meal. What changed:   medication strength  how much to take   cetirizine 5 MG tablet Commonly known as: ZYRTEC Take 1 tablet (5 mg total) by mouth daily. What changed:   when to take this  reasons to take this   clonazePAM 0.25 MG disintegrating tablet Commonly known as: KLONOPIN Take 1 tablet (0.25 mg total) by mouth 2 (two) times daily.   colchicine 0.6 MG tablet Take 0.6 mg by mouth 2 (two) times daily as needed (gout).   mirtazapine 15 MG tablet Commonly known as: REMERON Take 1 tablet (15 mg total) by mouth at bedtime.   multivitamin Tabs tablet Take 1 tablet by mouth at bedtime.   polyethylene glycol 17 g packet Commonly known as: MIRALAX / GLYCOLAX Take 17 g by mouth 2 (two) times daily.   PRESCRIPTION MEDICATION Pt has CPAP machine   senna 8.6 MG Tabs tablet Commonly known as: SENOKOT Take 1 tablet (8.6 mg total) by mouth daily.       Discharge Instructions: Please  refer to Patient Instructions section of EMR for full details.  Patient was counseled important signs and symptoms that should prompt return to medical care, changes in medications, dietary instructions, activity restrictions, and follow up appointments.   Follow-Up Appointments:  Contact information for follow-up providers    [name], MD Follow up.   Specialty: Family Medicine Why: call after discharge from SNF for follow up. Contact information: [place] [address] [phone]            Contact information for after-discharge care    Destination    [facility] .   Service: Skilled Nursing Contact information: [address] [place] [phone]                Follow up with PCP pending SNF stay  Kathrene Alu, MD 06/16/2019, 8:46 AM PGY-1, [place]

## 2019-06-16 NOTE — Progress Notes (Addendum)
Pt didn't d/c to [facility] on yesterday 2/2 transportation cancelled  @ 12:30 midnight 2/2 to very late pickup time. Pt will transition to [facility] today.  Patient will DC to: [facility] Anticipated DC date: 06/16/2019 Family notified: Marijo Conception( daughter) Transport by: Corey Harold   Per MD patient ready for DC to [place].RN, patient, patient's family, and facility notified of DC. Discharge Summary and FL2 sent to facility. RN to call report prior to discharge [phone]). DC packet on chart. Ambulance transport requested for patient.   RNCM will sign off for now as intervention is no longer needed. Please consult [place] again if new needs arise.

## 2019-06-17 ENCOUNTER — Encounter: Payer: Self-pay | Admitting: Internal Medicine

## 2019-06-17 ENCOUNTER — Non-Acute Institutional Stay (SKILLED_NURSING_FACILITY): Payer: Medicare Other | Admitting: Internal Medicine

## 2019-06-17 DIAGNOSIS — L89154 Pressure ulcer of sacral region, stage 4: Secondary | ICD-10-CM | POA: Diagnosis not present

## 2019-06-17 DIAGNOSIS — U071 COVID-19: Secondary | ICD-10-CM

## 2019-06-17 DIAGNOSIS — J9601 Acute respiratory failure with hypoxia: Secondary | ICD-10-CM | POA: Diagnosis not present

## 2019-06-17 DIAGNOSIS — Z992 Dependence on renal dialysis: Secondary | ICD-10-CM

## 2019-06-17 DIAGNOSIS — J80 Acute respiratory distress syndrome: Secondary | ICD-10-CM

## 2019-06-17 DIAGNOSIS — N186 End stage renal disease: Secondary | ICD-10-CM

## 2019-06-17 DIAGNOSIS — E1122 Type 2 diabetes mellitus with diabetic chronic kidney disease: Secondary | ICD-10-CM

## 2019-06-17 DIAGNOSIS — G931 Anoxic brain damage, not elsewhere classified: Secondary | ICD-10-CM | POA: Diagnosis not present

## 2019-06-17 DIAGNOSIS — R5381 Other malaise: Secondary | ICD-10-CM

## 2019-06-17 DIAGNOSIS — Z8739 Personal history of other diseases of the musculoskeletal system and connective tissue: Secondary | ICD-10-CM | POA: Insufficient documentation

## 2019-06-17 NOTE — Assessment & Plan Note (Signed)
Glucoses while hospitalized ranged from a low of 97 up to a high of 248.  The latter was an outlier as the vast majority are less than 150.  On 10/28, A1c was 5.8%. Glipizide and linagliptin were discontinued during the hospitalization 11/10-12/30/2020

## 2019-06-17 NOTE — Assessment & Plan Note (Signed)
Wound care monitor by wound care nurse at SNF.  Daily NS moistened gauze covered with dry gauze and secured with silicone foam dressing.  Minimization of supine positioning recommended.  Pressure redistribution wheelchair cushion and air mattress if possible.

## 2019-06-17 NOTE — Progress Notes (Signed)
Family called upset that the patient was not being assisted to bathroom and pain medication wasn't given before HD. Asked about being readmitted for a change of placement. Social work gave family contact numbers for the facility social worker and HD site.  Also, given Nursing director contact information for [facility] to discuss potential transfer to [place] and to address concerns. No other concerns voiced at this time.

## 2019-06-17 NOTE — Assessment & Plan Note (Signed)
HD Tuesday, Thursday, Saturday

## 2019-06-17 NOTE — Assessment & Plan Note (Signed)
PT/OT at SNF °

## 2019-06-17 NOTE — Progress Notes (Signed)
NURSING HOME LOCATION:  [facility] ROOM NUMBER:  [id number]  CODE STATUS:  FULL CODE  PCP:  [name], MD  [address] [place] [address]   This is a comprehensive admission note to [facility] performed on this date less than 30 days from date of admission. Included are preadmission medical/surgical history; reconciled medication list; family history; social history and comprehensive review of systems.  Corrections and additions to the records were documented. Comprehensive physical exam was also performed. Additionally a clinical summary was entered for each active diagnosis pertinent to this admission in the Problem List to enhance continuity of care.  HPI: He had been hospitalized for Covid pneumonia and discharged on November 3.  On November 10 the patient was transferred from HD with complaints of shortness of breath with documented hypoxemia and bradycardia.  During transport to the ED he went into PEA arrest and was resuscitated after 30 minutes of CPR.  Neurology was consulted for "twitching" worrisome for seizure activity.  No seizure activity was noted on continuous EEG recording.  EEG did show nonspecific profound diffuse encephalopathy.  MRI did not show any acute process.  His acute respiratory distress was felt to be multifactorial including Covid infection, possible superimposed bacterial infection, and hypervolemia from ESRD.  Empirically he received vancomycin and Zosyn which was transitioned to ceftriaxone for total course of 7 days.  He remained in the ICU mechanically ventilated until extubated 11/23.  He was transitioned from CRRT to HD on 11/24 and transferred from the ICU on 11/25. The patient's mentation improved but he continued to exhibit confusion and delayed mentation.  In the absence of documented seizure disorder, Keppra was discontinued on 11/25.  Follow-up ammonia normalized. SLP advanced his diet to dysphagia 3.  Inpatient  rehab was recommended by PT/OT. Somnolence presented 12/1 and on 12/2 he was found dangling from the oversized bed.  CT revealed no acute intracranial abnormality.  On 12/3 there was concern for seizure like activity but not documented on EEG. Leukocytosis associated with Covid infection did downtrend.  Chest x-ray on 12/2 performed because of elevated temp revealed left lower lobe HCPNA.  Follow-up Covid testing was negative on 12/7 and 12/28. Hospital course was complicated by left upper extremity and sacral ulcer due to being bedbound.  Hydrotherapy was begun on 12/8; this required up to 7.5 mg Oxcodone IR for pain control during the hydrotherapy.  Patient was turned every 2 hours.  General surgery was consulted for possible wound VAC as patient did not tolerate hydrotherapy.  Wound care felt the patient would not be a good candidate for wound VAC due to frequent stool incontinence.  Plastic surgery deferred surgical correction due to his hospital placement.  Nutrition optimization was pursued. Wound care was to include daily NS moistened gauze covered with dry gauze and secured with silicone foam dressing.  Minimization of supine positioning was recommended.  Pressure redistribution chair cushion was recommended when up in the chair.  Plastic surgeon also recommended continued use of air mattress. Although no definite seizure activity was documented the patient did exhibit bilateral upper extremity tremor present since his cardiac arrest.  Neurology questioned that this was related to anoxic brain injury.  Clonazepam 0.25 twice daily was initiated with subsequent transition as clinically indicated. Amlodipine, gabapentin, glipizide, linagliptin, minoxidil and torsemide were discontinued as they were not required during the hospital stay.  Dialysis was to continue Tuesday, Thursday, Saturday.  Past medical and surgical history: Includes asthmatic bronchitis,  essential hypertension, allergic rhinitis,  dyslipidemia, gout, obstructive sleep apnea, history of non-STEMI, history of cardiac arrest (11/01/2009 in addition to 04/27/2019), diabetes with renal manifestations, & history diverticulosis.  He has had neurosurgery at [facility] and [facility].  He has had multiple vascular procedures related to his CKD.  Social history: Social alcohol; former cigar smoker for 10 years. Former Engineer, drilling.  Family history: Reviewed; extensive family history of hypertension.   Review of systems: Date given as June 15, 2019. His main complaint is pain related to the sacral ulcer which he refers to as "a hole in the back or bedsore". He also does have numbness in his toes. Patient's family is requesting transfer to [facility].  Apparently they are concerned that he is not changed after stool incontinence during dialysis.  It is their impression that this would be completed at [facility].  He has been on multiple antibiotics during his prolonged illness.  There is no probiotic on record.  He is oliguric with the ESRD.  Constitutional: No fever, significant weight change, fatigue  Eyes: No redness, discharge, pain, vision change ENT/mouth: No nasal congestion, purulent discharge, earache, change in hearing, sore throat  Cardiovascular: No chest pain, palpitations, paroxysmal nocturnal dyspnea, claudication, edema  Respiratory: No cough, sputum production, hemoptysis, DOE, significant snoring, apnea Gastrointestinal: No heartburn, dysphagia, abdominal pain, nausea /vomiting, rectal bleeding, melena Musculoskeletal: No joint stiffness, joint swelling, weakness Dermatologic: No rash, pruritus Neurologic: No dizziness, headache, syncope, seizures,  tingling Psychiatric: No significant anxiety, depression, insomnia, anorexia Endocrine: No change in hair/skin/nails, excessive thirst, excessive hunger Hematologic/lymphatic: No significant bruising, lymphadenopathy, abnormal bleeding Allergy/immunology: No itchy/watery eyes,  significant sneezing, urticaria, angioedema  Physical exam:  Pertinent or positive findings: There is a clinically amazing discrepancy between his complicated catastrophic history and his present appearance. He sitting up in bed eating, articulating and communicating well. He is Geneticist, molecular. He has a lower plate. He has a grade 1 raspy systolic murmur at the base loudest on the left. Abdomen is protuberant. He has 1/2+ pedal edema at the sock line. Pedal pulses are decreased. He exhibits some intention tremor of the hands and lower extremities. He also has myoclonic type activity with opposition to the lower extremities. He has splotchy irregular hyperpigmentation around the left patella.  Dressings are present over the sacrum as well as the left anterior shoulder area.  General appearance: Adequately nourished; no acute distress, increased work of breathing is present.   Lymphatic: No lymphadenopathy about the head, neck, axilla. Eyes: No conjunctival inflammation or lid edema is present. There is no scleral icterus. Ears:  External ear exam shows no significant lesions or deformities.   Nose:  External nasal examination shows no deformity or inflammation. Nasal mucosa are pink and moist without lesions, exudates Oral exam: Lips and gums are healthy appearing.There is no oropharyngeal erythema or exudate. Neck:  No thyromegaly, masses, tenderness noted.    Heart:  Normal rate and regular rhythm. S1 and S2 normal without gallop, click, rub.  Lungs: Chest clear to auscultation without wheezes, rhonchi, rales, rubs. Abdomen: Bowel sounds are normal.  Abdomen is soft and nontender with no organomegaly, hernias, masses. GU: Deferred  Extremities:  No cyanosis, clubbing. Neurologic exam:  Strength equal  in upper & lower extremities. Balance, Rhomberg, finger to nose testing could not be completed due to clinical state Skin: Warm & dry w/o tenting. No significant  rash.  See clinical summary under  each active problem in the Problem List with associated updated therapeutic  plan

## 2019-06-17 NOTE — Patient Instructions (Signed)
See assessment and plan under each diagnosis in the problem list and acutely for this visit 

## 2019-06-17 NOTE — Assessment & Plan Note (Signed)
Follow-up Covid testing 12/7 and 12/28 negative

## 2019-06-17 NOTE — Assessment & Plan Note (Addendum)
Recheck uric acid; discontinue allopurinol if uric acid less than 5.  Colchicine would be administered only as needed. CK was within normal limits at 39 on 05/19/2019.

## 2019-06-18 DIAGNOSIS — E46 Unspecified protein-calorie malnutrition: Secondary | ICD-10-CM | POA: Diagnosis not present

## 2019-06-18 DIAGNOSIS — I272 Pulmonary hypertension, unspecified: Secondary | ICD-10-CM | POA: Diagnosis not present

## 2019-06-18 DIAGNOSIS — Z743 Need for continuous supervision: Secondary | ICD-10-CM | POA: Diagnosis not present

## 2019-06-18 DIAGNOSIS — E1159 Type 2 diabetes mellitus with other circulatory complications: Secondary | ICD-10-CM | POA: Diagnosis not present

## 2019-06-18 DIAGNOSIS — Z8674 Personal history of sudden cardiac arrest: Secondary | ICD-10-CM | POA: Diagnosis not present

## 2019-06-18 DIAGNOSIS — D638 Anemia in other chronic diseases classified elsewhere: Secondary | ICD-10-CM | POA: Diagnosis present

## 2019-06-18 DIAGNOSIS — R011 Cardiac murmur, unspecified: Secondary | ICD-10-CM | POA: Diagnosis not present

## 2019-06-18 DIAGNOSIS — Z8616 Personal history of COVID-19: Secondary | ICD-10-CM | POA: Diagnosis not present

## 2019-06-18 DIAGNOSIS — E1121 Type 2 diabetes mellitus with diabetic nephropathy: Secondary | ICD-10-CM | POA: Diagnosis not present

## 2019-06-18 DIAGNOSIS — B958 Unspecified staphylococcus as the cause of diseases classified elsewhere: Secondary | ICD-10-CM | POA: Diagnosis present

## 2019-06-18 DIAGNOSIS — I132 Hypertensive heart and chronic kidney disease with heart failure and with stage 5 chronic kidney disease, or end stage renal disease: Secondary | ICD-10-CM | POA: Diagnosis present

## 2019-06-18 DIAGNOSIS — R0602 Shortness of breath: Secondary | ICD-10-CM | POA: Diagnosis not present

## 2019-06-18 DIAGNOSIS — Z87891 Personal history of nicotine dependence: Secondary | ICD-10-CM | POA: Diagnosis not present

## 2019-06-18 DIAGNOSIS — E8770 Fluid overload, unspecified: Secondary | ICD-10-CM | POA: Diagnosis present

## 2019-06-18 DIAGNOSIS — I5032 Chronic diastolic (congestive) heart failure: Secondary | ICD-10-CM | POA: Diagnosis not present

## 2019-06-18 DIAGNOSIS — E1129 Type 2 diabetes mellitus with other diabetic kidney complication: Secondary | ICD-10-CM | POA: Diagnosis not present

## 2019-06-18 DIAGNOSIS — E1169 Type 2 diabetes mellitus with other specified complication: Secondary | ICD-10-CM | POA: Diagnosis present

## 2019-06-18 DIAGNOSIS — E872 Acidosis: Secondary | ICD-10-CM | POA: Diagnosis present

## 2019-06-18 DIAGNOSIS — R0689 Other abnormalities of breathing: Secondary | ICD-10-CM | POA: Diagnosis not present

## 2019-06-18 DIAGNOSIS — I5022 Chronic systolic (congestive) heart failure: Secondary | ICD-10-CM | POA: Diagnosis present

## 2019-06-18 DIAGNOSIS — Z992 Dependence on renal dialysis: Secondary | ICD-10-CM | POA: Diagnosis not present

## 2019-06-18 DIAGNOSIS — I252 Old myocardial infarction: Secondary | ICD-10-CM | POA: Diagnosis not present

## 2019-06-18 DIAGNOSIS — R5381 Other malaise: Secondary | ICD-10-CM | POA: Diagnosis not present

## 2019-06-18 DIAGNOSIS — J189 Pneumonia, unspecified organism: Secondary | ICD-10-CM | POA: Diagnosis present

## 2019-06-18 DIAGNOSIS — N186 End stage renal disease: Secondary | ICD-10-CM | POA: Diagnosis present

## 2019-06-18 DIAGNOSIS — M6281 Muscle weakness (generalized): Secondary | ICD-10-CM | POA: Diagnosis not present

## 2019-06-18 DIAGNOSIS — R41841 Cognitive communication deficit: Secondary | ICD-10-CM | POA: Diagnosis not present

## 2019-06-18 DIAGNOSIS — E669 Obesity, unspecified: Secondary | ICD-10-CM | POA: Diagnosis present

## 2019-06-18 DIAGNOSIS — Y95 Nosocomial condition: Secondary | ICD-10-CM | POA: Diagnosis present

## 2019-06-18 DIAGNOSIS — E785 Hyperlipidemia, unspecified: Secondary | ICD-10-CM | POA: Diagnosis present

## 2019-06-18 DIAGNOSIS — G4733 Obstructive sleep apnea (adult) (pediatric): Secondary | ICD-10-CM | POA: Diagnosis present

## 2019-06-18 DIAGNOSIS — G931 Anoxic brain damage, not elsewhere classified: Secondary | ICD-10-CM | POA: Diagnosis not present

## 2019-06-18 DIAGNOSIS — R0902 Hypoxemia: Secondary | ICD-10-CM | POA: Diagnosis not present

## 2019-06-18 DIAGNOSIS — Z7982 Long term (current) use of aspirin: Secondary | ICD-10-CM | POA: Diagnosis not present

## 2019-06-18 DIAGNOSIS — Z79899 Other long term (current) drug therapy: Secondary | ICD-10-CM | POA: Diagnosis not present

## 2019-06-18 DIAGNOSIS — L89154 Pressure ulcer of sacral region, stage 4: Secondary | ICD-10-CM | POA: Diagnosis present

## 2019-06-18 DIAGNOSIS — I120 Hypertensive chronic kidney disease with stage 5 chronic kidney disease or end stage renal disease: Secondary | ICD-10-CM | POA: Diagnosis not present

## 2019-06-18 DIAGNOSIS — F419 Anxiety disorder, unspecified: Secondary | ICD-10-CM | POA: Diagnosis present

## 2019-06-18 DIAGNOSIS — D631 Anemia in chronic kidney disease: Secondary | ICD-10-CM | POA: Diagnosis not present

## 2019-06-18 DIAGNOSIS — J9601 Acute respiratory failure with hypoxia: Secondary | ICD-10-CM | POA: Diagnosis present

## 2019-06-18 DIAGNOSIS — U071 COVID-19: Secondary | ICD-10-CM | POA: Diagnosis not present

## 2019-06-18 DIAGNOSIS — N2581 Secondary hyperparathyroidism of renal origin: Secondary | ICD-10-CM | POA: Diagnosis present

## 2019-06-18 DIAGNOSIS — G9340 Encephalopathy, unspecified: Secondary | ICD-10-CM | POA: Diagnosis not present

## 2019-06-18 DIAGNOSIS — A499 Bacterial infection, unspecified: Secondary | ICD-10-CM | POA: Diagnosis not present

## 2019-06-18 DIAGNOSIS — J80 Acute respiratory distress syndrome: Secondary | ICD-10-CM | POA: Diagnosis not present

## 2019-06-18 DIAGNOSIS — R069 Unspecified abnormalities of breathing: Secondary | ICD-10-CM | POA: Diagnosis not present

## 2019-06-18 DIAGNOSIS — I340 Nonrheumatic mitral (valve) insufficiency: Secondary | ICD-10-CM | POA: Diagnosis not present

## 2019-06-18 DIAGNOSIS — N185 Chronic kidney disease, stage 5: Secondary | ICD-10-CM | POA: Diagnosis not present

## 2019-06-18 DIAGNOSIS — R7881 Bacteremia: Secondary | ICD-10-CM | POA: Diagnosis present

## 2019-06-18 DIAGNOSIS — I2510 Atherosclerotic heart disease of native coronary artery without angina pectoris: Secondary | ICD-10-CM | POA: Diagnosis present

## 2019-06-18 DIAGNOSIS — M109 Gout, unspecified: Secondary | ICD-10-CM | POA: Diagnosis not present

## 2019-06-18 DIAGNOSIS — E1140 Type 2 diabetes mellitus with diabetic neuropathy, unspecified: Secondary | ICD-10-CM | POA: Diagnosis present

## 2019-06-18 DIAGNOSIS — E1122 Type 2 diabetes mellitus with diabetic chronic kidney disease: Secondary | ICD-10-CM | POA: Diagnosis present

## 2019-06-18 DIAGNOSIS — R279 Unspecified lack of coordination: Secondary | ICD-10-CM | POA: Diagnosis not present

## 2019-06-18 DIAGNOSIS — J810 Acute pulmonary edema: Secondary | ICD-10-CM | POA: Diagnosis not present

## 2019-06-18 DIAGNOSIS — B957 Other staphylococcus as the cause of diseases classified elsewhere: Secondary | ICD-10-CM | POA: Diagnosis not present

## 2019-06-18 DIAGNOSIS — G253 Myoclonus: Secondary | ICD-10-CM | POA: Diagnosis present

## 2019-06-19 DIAGNOSIS — Z992 Dependence on renal dialysis: Secondary | ICD-10-CM | POA: Diagnosis not present

## 2019-06-19 DIAGNOSIS — N186 End stage renal disease: Secondary | ICD-10-CM | POA: Diagnosis not present

## 2019-06-19 DIAGNOSIS — D631 Anemia in chronic kidney disease: Secondary | ICD-10-CM | POA: Diagnosis not present

## 2019-06-19 DIAGNOSIS — N2581 Secondary hyperparathyroidism of renal origin: Secondary | ICD-10-CM | POA: Diagnosis not present

## 2019-06-19 DIAGNOSIS — A499 Bacterial infection, unspecified: Secondary | ICD-10-CM | POA: Diagnosis not present

## 2019-06-19 DIAGNOSIS — E1121 Type 2 diabetes mellitus with diabetic nephropathy: Secondary | ICD-10-CM | POA: Diagnosis not present

## 2019-06-21 ENCOUNTER — Encounter (HOSPITAL_COMMUNITY): Payer: Self-pay | Admitting: *Deleted

## 2019-06-21 ENCOUNTER — Other Ambulatory Visit: Payer: Self-pay

## 2019-06-21 ENCOUNTER — Emergency Department (HOSPITAL_COMMUNITY)
Admission: EM | Admit: 2019-06-21 | Discharge: 2019-06-21 | Disposition: A | Discharge status: Home / Self Care | Payer: Medicare Other | Attending: Emergency Medicine | Admitting: Emergency Medicine

## 2019-06-21 DIAGNOSIS — D631 Anemia in chronic kidney disease: Secondary | ICD-10-CM | POA: Diagnosis not present

## 2019-06-21 DIAGNOSIS — Z8674 Personal history of sudden cardiac arrest: Secondary | ICD-10-CM | POA: Insufficient documentation

## 2019-06-21 DIAGNOSIS — I2510 Atherosclerotic heart disease of native coronary artery without angina pectoris: Secondary | ICD-10-CM | POA: Insufficient documentation

## 2019-06-21 DIAGNOSIS — Z7982 Long term (current) use of aspirin: Secondary | ICD-10-CM | POA: Insufficient documentation

## 2019-06-21 DIAGNOSIS — N186 End stage renal disease: Secondary | ICD-10-CM | POA: Insufficient documentation

## 2019-06-21 DIAGNOSIS — E1122 Type 2 diabetes mellitus with diabetic chronic kidney disease: Secondary | ICD-10-CM | POA: Diagnosis not present

## 2019-06-21 DIAGNOSIS — Z79899 Other long term (current) drug therapy: Secondary | ICD-10-CM | POA: Insufficient documentation

## 2019-06-21 DIAGNOSIS — E1121 Type 2 diabetes mellitus with diabetic nephropathy: Secondary | ICD-10-CM | POA: Diagnosis not present

## 2019-06-21 DIAGNOSIS — L89154 Pressure ulcer of sacral region, stage 4: Secondary | ICD-10-CM

## 2019-06-21 DIAGNOSIS — Z992 Dependence on renal dialysis: Secondary | ICD-10-CM | POA: Diagnosis not present

## 2019-06-21 DIAGNOSIS — N2581 Secondary hyperparathyroidism of renal origin: Secondary | ICD-10-CM | POA: Diagnosis not present

## 2019-06-21 DIAGNOSIS — Z5189 Encounter for other specified aftercare: Secondary | ICD-10-CM

## 2019-06-21 DIAGNOSIS — I120 Hypertensive chronic kidney disease with stage 5 chronic kidney disease or end stage renal disease: Secondary | ICD-10-CM | POA: Insufficient documentation

## 2019-06-21 DIAGNOSIS — A499 Bacterial infection, unspecified: Secondary | ICD-10-CM | POA: Diagnosis not present

## 2019-06-21 MED ORDER — OXYCODONE-ACETAMINOPHEN 5-325 MG PO TABS
1.0000 | ORAL_TABLET | Freq: Once | ORAL | Status: AC
  Administered 2019-06-21: 16:00:00 1 via ORAL
  Filled 2019-06-21: qty 1

## 2019-06-21 NOTE — ED Notes (Signed)
This RN had brief discussion with pt and daughter at bedside re. POC and options moving forward.  Daughter requested to speak again with Mariann Laster, Case Manager.  This RN spoke with Mariann Laster by phone who emphasizes that pt has limited options for SNF placement d/t HD, will ask SW to come speak with pt and family.

## 2019-06-21 NOTE — ED Provider Notes (Signed)
[facility] Provider Note   CSN: [id number] Arrival date & time: 06/21/19  1343     History Chief Complaint  [facility] presents with  . Wound Check    pressure ulcer    Tel Hevia is a 54 y.o. male.  [facility] is a 54 y.o. male with a history of hypertension, hyperlipidemia, CAD, diabetes, dialysis, decubitus ulcer, recent admission for cardiac arrest, who presents to the ED accompanied by his daughter after the [facility] was sent to the hospital from [facility] skilled nursing facility.  [facility] and daughter have not been satisfied with their experience at [facility].  Daughter is concerned that [facility]'s pressure ulcer on his buttock has not been appropriately cared for.  The [facility]'s daughter also expresses concern on [facility]'s pain from his decubitus ulcer not being treated, [facility] states that [facility] was taking oxycodone or morphine for pain while [facility] was hospitalized.  [facility] was discharged from the hospital after prolonged admission after cardiac arrest to nursing facility on 12/30.  [facility]'s daughter states that [facility] was sent to the hospital with the intent for [facility] to be transferred to [facility] or another skilled nursing facility.  [facility] states that other than pain surrounding his decubitus ulcer [facility] denies any other focal complaints.  [facility] denies any fevers, chest pain, shortness of breath, abdominal pain, nausea or vomiting.  [facility] states that [facility] has not noticed any increasing drainage from his ulcer but due to its location [facility] is not able to see it well.  [facility] completed dialysis this morning.  No other aggravating or alleviating factors.        Past Medical History:  Diagnosis Date  . CARDIAC ARREST 11/01/2009   Qualifier: Diagnosis of  By: Selena Batten CMA, [place]    . Colon polyps 01/02/2016   Colonoscopy July 2017 - One 3 mm polyp in the transverse colon, removed with a cold biopsy forceps. Resected and retrieved. - One 3 mm polyp in  the rectum, removed with a cold biopsy forceps. Resected and retrieved. - Diverticulosis in the entire examined colon. - Non-bleeding internal hemorrhoids. - The examination was otherwise normal. - Significant looping which prolonged cecal    . Coronary artery disease 08/15/2009   with AMI  . Coronary atherosclerosis 11/01/2009   Qualifier: Diagnosis of  By: Selena Batten CMA, Jewel    . Dialysis [facility] ([place]) 09/27/2014   mon, wed, and fri  . Diverticulosis of colon without hemorrhage 01/02/2016   Colonoscopy July 2017 - One 3 mm polyp in the transverse colon, removed with a cold biopsy forceps. Resected and retrieved. - One 3 mm polyp in the rectum, removed with a cold biopsy forceps. Resected and retrieved. - Diverticulosis in the entire examined colon. - Non-bleeding internal hemorrhoids. - The examination was otherwise normal. - Significant looping which prolonged cecal    . DM (diabetes mellitus), type 2 with renal complications ([place]) 02/63/7858  . Essential hypertension 11/01/2009   Qualifier: Diagnosis of  By: Selena Batten CMA, Jewel    . Gout 10/21/2018  . History of acute respiratory distress syndrome (ARDS) due to COVID-19 virus   . History of cardiac arrest 04/27/2019  . History of non-ST elevation myocardial infarction (NSTEMI) 04/18/2019  . history of respiratory arrest   . Hyperlipidemia   . Hypertension   . Mitral valve regurgitation 09/19/2015   Echo 09/2015   . 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 OSA treated with BiPAP 06/17/2010  . Sleep apnea    cpap nightly  [facility] Active Problem List   Diagnosis Date Noted  . History of gout 06/17/2019  . 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 Pain aggravated by sitting 06/09/2019  . Pressure ulcer, shoulder blades, stage II ([place]), Left 06/09/2019  . Bowel incontinence 06/09/2019  . Protein-calorie malnutrition ([place]) 06/01/2019  . Weakness acquired in ICU   . Anoxic brain injury ([place])   . Physical deconditioning   . Sacral decubitus  ulcer, stage IV ([place]) 05/10/2019  . History of acute respiratory distress syndrome (ARDS) due to COVID-19 virus   . History of cardiac arrest 04/27/2019  . History of non-ST elevation myocardial infarction (NSTEMI) 04/18/2019  . COVID-19 virus detected 04/14/2019  . Pulmonary hypertension ([place]) 04/14/2019  . Acute respiratory distress syndrome (ARDS) due to COVID-19 virus ([place]) 04/14/2019  . Acute respiratory failure with hypoxia ([place]) 04/14/2019  . Morbid obesity ([place]) 04/14/2019  . Diabetic neuropathy ([place]) 04/02/2016  . Hypertension associated with diabetes ([place]) 10/03/2015  . HLD (hyperlipidemia) 09/06/2015  . End-stage renal disease on hemodialysis ([place]) 08/31/2015  . DM (diabetes mellitus), type 2 with renal complications ([place]) 92/42/6834  . OSA treated with BiPAP 12/12/2013  . Coronary atherosclerosis 11/01/2009    Past Surgical History:  Procedure Laterality Date  . AV FISTULA PLACEMENT Left 09/27/2014  . INSERTION OF ARTERIOVENOUS (AV) ARTEGRAFT ARM Left 04/02/2018   Procedure: INSERTION OF ARTERIOVENOUS (AV) ARTEGRAFT ARM LEFT UPPER ARM;  Surgeon: Waynetta Sandy, MD;  Location: [facility];  Service: Vascular;  Laterality: Left;  . INSERTION OF DIALYSIS CATHETER N/A 04/02/2018   Procedure: INSERTION OF DIALYSIS CATHETER, right internal jugular;  Surgeon: Waynetta Sandy, MD;  Location: [facility];  Service: Vascular;  Laterality: N/A;  . NECK SURGERY     C6 & C7 replaced 30 yrs ago per pt  . REVISON OF ARTERIOVENOUS FISTULA Left 04/02/2018   Procedure: REVISION PLICATION OF ARTERIOVENOUS FISTULA ARM;  Surgeon: Waynetta Sandy, MD;  Location: [facility];  Service: Vascular;  Laterality: Left;  . WISDOM TOOTH EXTRACTION         Family History  Problem Relation Age of Onset  . Hypertension Mother   . Heart disease Mother 33       cause of death  . Hypertension Father   . Aneurysm Father 28       brain aneurysm  . Heart disease Father 83       cause of  death  . Hypertension Brother   . Alcohol abuse Brother   . Diabetes Brother   . Pancreatitis Brother   . Hypertension Brother   . Colon cancer Neg Hx     Social History   Tobacco Use  . Smoking status: Former Smoker    Packs/day: 1.00    Years: 10.00    Pack years: 10.00    Types: Cigars    Quit date: 06/18/2011    Years since quitting: 8.0  . Smokeless tobacco: Never Used  Substance Use Topics  . Alcohol use: Yes    Alcohol/week: 1.0 standard drinks    Types: 1 Standard drinks or equivalent per week    Comment: beer or liquor occasionally  . Drug use: No    Home Medications Prior to Admission medications   Medication Sig Start Date End Date Taking? Authorizing Provider  albuterol (VENTOLIN HFA) 108 (90 Base) MCG/ACT inhaler Inhale 1-2 puffs into the lungs every 6 (six) hours as needed for wheezing or shortness of breath. 04/20/19   Daisy Floro, DO  allopurinol (ZYLOPRIM)  100 MG tablet Take 100 mg by mouth 2 (two) times daily.  10/04/14   [provider]  aspirin 81 MG EC tablet Take 81 mg by mouth daily. 04/21/19   [provider]  atorvastatin (LIPITOR) 80 MG tablet Take 1 tablet (80 mg total) by mouth daily. 04/21/19   Daisy Floro, DO  AURYXIA 1 GM 210 MG(Fe) tablet Take 420-840 mg by mouth See admin instructions. 840 mg with meals, and 420 mg with snacks 01/11/19   [provider]  bisacodyl (DULCOLAX) 10 MG suppository Place 10 mg rectally daily as needed for moderate constipation (If not relieved by MOM).    [provider]  carvedilol (COREG) 6.25 MG tablet Take 1 tablet (6.25 mg total) by mouth 2 (two) times daily with a meal. 06/16/19 07/16/19  Winfrey, Alcario Drought, MD  cetirizine (ZYRTEC) 5 MG tablet Take 1 tablet (5 mg total) by mouth daily. 10/21/18   [name], MD  clonazePAM (KLONOPIN) 0.25 MG disintegrating tablet Take 1 tablet (0.25 mg total) by mouth 2 (two) times daily. 06/15/19   Lurline Del, DO  colchicine 0.6  MG tablet Take 0.6 mg by mouth 2 (two) times daily as needed (gout).  12/11/10   [provider]  mirtazapine (REMERON) 15 MG tablet Take 1 tablet (15 mg total) by mouth at bedtime. 06/15/19   Lurline Del, DO  multivitamin (RENA-VIT) TABS tablet Take 1 tablet by mouth at bedtime.    [provider]  polyethylene glycol (MIRALAX / GLYCOLAX) 17 g packet Take 17 g by mouth 2 (two) times daily. 06/16/19   Kathrene Alu, MD  PRESCRIPTION MEDICATION Pt has CPAP machine    [provider]  senna (SENOKOT) 8.6 MG TABS tablet Take 1 tablet (8.6 mg total) by mouth daily. 06/16/19   Kathrene Alu, MD  Sodium Phosphates (RA SALINE ENEMA RE) Place 1 each rectally daily as needed (If no BM after Bisacodyl).    [provider]    Allergies    [facility] has no known allergies.  Review of Systems   Review of Systems  Constitutional: Negative for chills and fever.  HENT: Negative.   Respiratory: Negative for cough and shortness of breath.   Cardiovascular: Negative for chest pain.  Gastrointestinal: Negative for abdominal pain.  Genitourinary: Negative for dysuria.  Musculoskeletal: Positive for back pain (From sacral wound). Negative for arthralgias and myalgias.  Skin: Positive for wound. Negative for color change.  Neurological: Negative for weakness and numbness.    Physical Exam Updated Vital Signs BP (!) 141/80 (BP Location: Right Arm)   Pulse 95   Temp 98.7 F (37.1 C) (Oral)   Resp 16   Ht 5\' 10"  (1.778 m)   Wt 96 kg   SpO2 94%   BMI 30.37 kg/m   Physical Exam Vitals and nursing note reviewed.  Constitutional:      General: [facility] is not in acute distress.    Appearance: Normal appearance. [facility] is well-developed. [facility] is not ill-appearing or diaphoretic.  HENT:     Head: Normocephalic and atraumatic.  Eyes:     General:        Right eye: No discharge.        Left eye: No discharge.     Pupils: Pupils are equal, round, and reactive to light.    Cardiovascular:     Rate and Rhythm: Normal rate and regular rhythm.     Pulses: Normal pulses.  Heart sounds: Normal heart sounds. No murmur. No friction rub. No gallop.   Pulmonary:     Effort: Pulmonary effort is normal. No respiratory distress.     Breath sounds: Normal breath sounds. No wheezing or rales.     Comments: Respirations equal and unlabored, [facility] able to speak in full sentences, lungs clear to auscultation bilaterally Abdominal:     General: Bowel sounds are normal. There is no distension.     Palpations: Abdomen is soft. There is no mass.     Tenderness: There is no abdominal tenderness. There is no guarding.     Comments: Abdomen soft, nondistended, nontender to palpation in all quadrants without guarding or peritoneal signs  Musculoskeletal:        General: No deformity.     Cervical back: Neck supple.  Skin:    General: Skin is warm and dry.     Capillary Refill: Capillary refill takes less than 2 seconds.     Comments: Stage 4 sacral pressure ulcer as pictured below, so surrounding erythema, no purulent drainage  Neurological:     Mental Status: [facility] is alert.     Coordination: Coordination normal.     Comments: Speech is clear, able to follow commands Moves extremities without ataxia, coordination intact  Psychiatric:        Mood and Affect: Mood normal.        Behavior: Behavior normal.         ED Results / Procedures / Treatments   Labs (all labs ordered are listed, but only abnormal results are displayed) Labs Reviewed - No data to display  EKG None  Radiology No results found.  Procedures Procedures (including critical care time)  Medications Ordered in ED Medications  oxyCODONE-acetaminophen (PERCOCET/ROXICET) 5-325 MG per tablet 1 tablet (1 tablet Oral Given 06/21/19 1552)    ED Course  I have reviewed the triage vital signs and the nursing notes.  Pertinent labs & imaging results that were available during my care of the [facility]  were reviewed by me and considered in my medical decision making (see chart for details).  Clinical Course as of Jun 21 1413  Mon Jun 21, 2019  1700 [facility] arrives requesting wound check, [facility] has a large sacral decubitus ulcer that has been managed by wound care in the hospital and then at [facility] skilled nursing facility, [facility] reports pain around the area is unchanged, on exam [facility] does not have signs of surrounding infection, there is no purulent drainage, appears consistent to photos of wound from hospitalization.  [facility] has not had any fevers and has stable vitals and I have very low suspicion for acute wound infection.  Family member has not been satisfied with nursing care at [facility] and was sent to the hospital with the expectation of being placed at a different facility.  Case management has been consulted.   [KF]  1800 Case discussed with Lucia Bitter, RN with case management who has been in to speak with [facility] and family member, will plan to discuss with social work different placement options, versus returning to [place].   [KF]  2038 Update from social work, [facility] and family are agreeable to returning to [place], Education officer, museum will contact [facility] to see if they will accept [facility] to return   [KF]  2150 [facility] accepted to return to [facility], family is agreeable, pt discharged and [facility] called for transport   [KF]    Clinical Course User Index [KF] Jacqlyn Larsen, [place]  MDM Rules/Calculators/A&P                      49-year-old male presents from [place] [place], requesting wound check and wanting to discuss other options for placement after family has not been satisfied with [facility]'s care.  [facility] has stage IV sacral pressure ulcer, which on exam does not show signs of infection or worsening when compared to previous pictures.  [facility] has pain in this area and has concerns over not eating consistent pain medication for this was pain was treated in the  ED with improvement.  Case management and social work was consulted and discussed with [facility] and family member placement options, at this time there are no other facilities able to accept the [facility] but fortunately [facility] is able to take the [facility] back, after discussions [facility] Wells and family are agreeable with this.  [facility] will be discharged back to facility with [facility] transport.    At this time there does not appear to be any evidence of an acute emergency medical condition and the [facility] appears stable for discharge with appropriate outpatient follow up.Diagnosis was discussed with [facility] who verbalizes understanding and is agreeable to discharge. Pt case discussed with Dr. Roslynn Amble who agrees with my plan.    Final Clinical Impression(s) / ED Diagnoses Final diagnoses:  Visit for wound check  Pressure injury of sacral region, stage 4 [facility])    Rx / DC Orders ED Discharge Orders    None       Jacqlyn Larsen, [place] 06/22/19 1417    Lucrezia Starch, MD 06/23/19 1316

## 2019-06-21 NOTE — ED Triage Notes (Signed)
Pt. Requesting something for pain , has a ulcer on his back.

## 2019-06-21 NOTE — ED Triage Notes (Addendum)
Pt just left  [facility] d/t "sub-par" care  And is due to go to [facility].  He states they were missing meds and not feeding him on a regular basis.  He's here to have his pressure ulcer assessed and to speak with social work.  Pt completed dialysis.

## 2019-06-21 NOTE — ED Notes (Signed)
Pt. States he usually takes Morphine or oxycodone for pain

## 2019-06-21 NOTE — Discharge Instructions (Signed)
Your sacral wound does not look infected today.  Continue with wound care at [facility]

## 2019-06-21 NOTE — ED Notes (Signed)
Called [facility] at 10:21

## 2019-06-21 NOTE — Progress Notes (Signed)
This Education officer, museum met with patient and patient's daughter at bedside. Patient discharged to [facility] following his hospital stay on 06/17/19. Patient and patient's daughter voiced several complaints  at [facility] with the staff not following medication orders appropriately.  This Education officer, museum discussed discharge options with patient in terms of home with [facility] with  DME or returning to [facility] with the expectation that patient and patient's daughter speak with the Director of Nursing in regards to concerns and  to develop a plan.  Patient and patient's daughter agreed for patient to return to [facility]. This Education officer, museum reinforced the importance of discussing her concerns and expectations with his nurse and  facilities leadership. Patient and patient's daughter also have the option of calling the local ombudsman if needed. State Ombudsmen contact number provider. [phone]  [facility] contacted, patient accepted back to the facility. Patient will be going back to room 314. RN to call report to I[place] at [phone]. Patient to be transported by [facility].  [place], [place] Social Work [phone]

## 2019-06-21 NOTE — Care Management (Signed)
ED CM met with patient and daughter Marijo Conception [phone] at bedside, who reports she was told by SW at [facility] to bring father to the ED to get him changed to another facility.  ED CM notified and is waiting on a return call.   7:15p  ED CM spoke with Levada Dy RN from [facility] who states, patient was faxed out to several SNF  But the only bed offers he received was [facility].  CM will attempt to contact [facility] to see if patient can return.

## 2019-06-22 NOTE — Discharge Planning (Signed)
EDCM notes disposition plan to return to [facility] room 314 today.

## 2019-06-23 DIAGNOSIS — A499 Bacterial infection, unspecified: Secondary | ICD-10-CM | POA: Diagnosis not present

## 2019-06-23 DIAGNOSIS — N186 End stage renal disease: Secondary | ICD-10-CM | POA: Diagnosis not present

## 2019-06-23 DIAGNOSIS — D631 Anemia in chronic kidney disease: Secondary | ICD-10-CM | POA: Diagnosis not present

## 2019-06-23 DIAGNOSIS — N2581 Secondary hyperparathyroidism of renal origin: Secondary | ICD-10-CM | POA: Diagnosis not present

## 2019-06-23 DIAGNOSIS — Z992 Dependence on renal dialysis: Secondary | ICD-10-CM | POA: Diagnosis not present

## 2019-06-23 DIAGNOSIS — E1121 Type 2 diabetes mellitus with diabetic nephropathy: Secondary | ICD-10-CM | POA: Diagnosis not present

## 2019-06-25 DIAGNOSIS — E1121 Type 2 diabetes mellitus with diabetic nephropathy: Secondary | ICD-10-CM | POA: Diagnosis not present

## 2019-06-25 DIAGNOSIS — A499 Bacterial infection, unspecified: Secondary | ICD-10-CM | POA: Diagnosis not present

## 2019-06-25 DIAGNOSIS — N186 End stage renal disease: Secondary | ICD-10-CM | POA: Diagnosis not present

## 2019-06-25 DIAGNOSIS — D631 Anemia in chronic kidney disease: Secondary | ICD-10-CM | POA: Diagnosis not present

## 2019-06-25 DIAGNOSIS — N2581 Secondary hyperparathyroidism of renal origin: Secondary | ICD-10-CM | POA: Diagnosis not present

## 2019-06-25 DIAGNOSIS — Z992 Dependence on renal dialysis: Secondary | ICD-10-CM | POA: Diagnosis not present

## 2019-06-27 ENCOUNTER — Inpatient Hospital Stay (HOSPITAL_COMMUNITY)
Admission: EM | Admit: 2019-06-27 | Discharge: 2019-07-01 | DRG: 640 | Disposition: A | Discharge status: Skilled Nursing Facility | Payer: Medicare Other | Attending: Internal Medicine | Admitting: Internal Medicine

## 2019-06-27 ENCOUNTER — Emergency Department (HOSPITAL_COMMUNITY): Payer: Medicare Other

## 2019-06-27 DIAGNOSIS — N186 End stage renal disease: Secondary | ICD-10-CM | POA: Diagnosis not present

## 2019-06-27 DIAGNOSIS — Z992 Dependence on renal dialysis: Secondary | ICD-10-CM

## 2019-06-27 DIAGNOSIS — I120 Hypertensive chronic kidney disease with stage 5 chronic kidney disease or end stage renal disease: Secondary | ICD-10-CM | POA: Diagnosis not present

## 2019-06-27 DIAGNOSIS — Z79899 Other long term (current) drug therapy: Secondary | ICD-10-CM

## 2019-06-27 DIAGNOSIS — Z8719 Personal history of other diseases of the digestive system: Secondary | ICD-10-CM

## 2019-06-27 DIAGNOSIS — D631 Anemia in chronic kidney disease: Secondary | ICD-10-CM | POA: Diagnosis present

## 2019-06-27 DIAGNOSIS — E1122 Type 2 diabetes mellitus with diabetic chronic kidney disease: Secondary | ICD-10-CM | POA: Diagnosis present

## 2019-06-27 DIAGNOSIS — M109 Gout, unspecified: Secondary | ICD-10-CM | POA: Diagnosis present

## 2019-06-27 DIAGNOSIS — Z833 Family history of diabetes mellitus: Secondary | ICD-10-CM

## 2019-06-27 DIAGNOSIS — Z8674 Personal history of sudden cardiac arrest: Secondary | ICD-10-CM

## 2019-06-27 DIAGNOSIS — N2581 Secondary hyperparathyroidism of renal origin: Secondary | ICD-10-CM | POA: Diagnosis present

## 2019-06-27 DIAGNOSIS — Z7982 Long term (current) use of aspirin: Secondary | ICD-10-CM

## 2019-06-27 DIAGNOSIS — E1140 Type 2 diabetes mellitus with diabetic neuropathy, unspecified: Secondary | ICD-10-CM | POA: Diagnosis present

## 2019-06-27 DIAGNOSIS — G253 Myoclonus: Secondary | ICD-10-CM | POA: Diagnosis present

## 2019-06-27 DIAGNOSIS — L89154 Pressure ulcer of sacral region, stage 4: Secondary | ICD-10-CM | POA: Diagnosis not present

## 2019-06-27 DIAGNOSIS — J810 Acute pulmonary edema: Secondary | ICD-10-CM | POA: Diagnosis not present

## 2019-06-27 DIAGNOSIS — D638 Anemia in other chronic diseases classified elsewhere: Secondary | ICD-10-CM | POA: Diagnosis present

## 2019-06-27 DIAGNOSIS — Z87891 Personal history of nicotine dependence: Secondary | ICD-10-CM

## 2019-06-27 DIAGNOSIS — E669 Obesity, unspecified: Secondary | ICD-10-CM | POA: Diagnosis present

## 2019-06-27 DIAGNOSIS — Z794 Long term (current) use of insulin: Secondary | ICD-10-CM

## 2019-06-27 DIAGNOSIS — J9601 Acute respiratory failure with hypoxia: Secondary | ICD-10-CM | POA: Diagnosis not present

## 2019-06-27 DIAGNOSIS — J189 Pneumonia, unspecified organism: Secondary | ICD-10-CM | POA: Diagnosis not present

## 2019-06-27 DIAGNOSIS — Z8249 Family history of ischemic heart disease and other diseases of the circulatory system: Secondary | ICD-10-CM

## 2019-06-27 DIAGNOSIS — F419 Anxiety disorder, unspecified: Secondary | ICD-10-CM | POA: Diagnosis present

## 2019-06-27 DIAGNOSIS — Z6830 Body mass index (BMI) 30.0-30.9, adult: Secondary | ICD-10-CM

## 2019-06-27 DIAGNOSIS — N185 Chronic kidney disease, stage 5: Secondary | ICD-10-CM | POA: Diagnosis not present

## 2019-06-27 DIAGNOSIS — E872 Acidosis: Secondary | ICD-10-CM | POA: Diagnosis present

## 2019-06-27 DIAGNOSIS — B957 Other staphylococcus as the cause of diseases classified elsewhere: Secondary | ICD-10-CM | POA: Diagnosis present

## 2019-06-27 DIAGNOSIS — E1169 Type 2 diabetes mellitus with other specified complication: Secondary | ICD-10-CM | POA: Diagnosis present

## 2019-06-27 DIAGNOSIS — E1159 Type 2 diabetes mellitus with other circulatory complications: Secondary | ICD-10-CM | POA: Diagnosis present

## 2019-06-27 DIAGNOSIS — G4733 Obstructive sleep apnea (adult) (pediatric): Secondary | ICD-10-CM | POA: Diagnosis present

## 2019-06-27 DIAGNOSIS — I152 Hypertension secondary to endocrine disorders: Secondary | ICD-10-CM | POA: Diagnosis present

## 2019-06-27 DIAGNOSIS — I252 Old myocardial infarction: Secondary | ICD-10-CM

## 2019-06-27 DIAGNOSIS — R7881 Bacteremia: Secondary | ICD-10-CM | POA: Diagnosis present

## 2019-06-27 DIAGNOSIS — E785 Hyperlipidemia, unspecified: Secondary | ICD-10-CM | POA: Diagnosis present

## 2019-06-27 DIAGNOSIS — E1129 Type 2 diabetes mellitus with other diabetic kidney complication: Secondary | ICD-10-CM | POA: Diagnosis present

## 2019-06-27 DIAGNOSIS — Z8616 Personal history of COVID-19: Secondary | ICD-10-CM | POA: Diagnosis not present

## 2019-06-27 DIAGNOSIS — I340 Nonrheumatic mitral (valve) insufficiency: Secondary | ICD-10-CM | POA: Diagnosis present

## 2019-06-27 DIAGNOSIS — Z811 Family history of alcohol abuse and dependence: Secondary | ICD-10-CM

## 2019-06-27 DIAGNOSIS — Z9119 Patient's noncompliance with other medical treatment and regimen: Secondary | ICD-10-CM

## 2019-06-27 DIAGNOSIS — Y95 Nosocomial condition: Secondary | ICD-10-CM | POA: Diagnosis present

## 2019-06-27 DIAGNOSIS — B958 Unspecified staphylococcus as the cause of diseases classified elsewhere: Secondary | ICD-10-CM | POA: Diagnosis present

## 2019-06-27 DIAGNOSIS — E8770 Fluid overload, unspecified: Secondary | ICD-10-CM | POA: Diagnosis not present

## 2019-06-27 DIAGNOSIS — I5022 Chronic systolic (congestive) heart failure: Secondary | ICD-10-CM | POA: Diagnosis present

## 2019-06-27 DIAGNOSIS — R0602 Shortness of breath: Secondary | ICD-10-CM | POA: Diagnosis not present

## 2019-06-27 DIAGNOSIS — I2510 Atherosclerotic heart disease of native coronary artery without angina pectoris: Secondary | ICD-10-CM | POA: Diagnosis present

## 2019-06-27 DIAGNOSIS — I132 Hypertensive heart and chronic kidney disease with heart failure and with stage 5 chronic kidney disease, or end stage renal disease: Secondary | ICD-10-CM | POA: Diagnosis present

## 2019-06-27 LAB — CBC WITH DIFFERENTIAL/PLATELET
Abs Immature Granulocytes: 0.02 10*3/uL (ref 0.00–0.07)
Basophils Absolute: 0.1 10*3/uL (ref 0.0–0.1)
Basophils Relative: 1 %
Eosinophils Absolute: 0.2 10*3/uL (ref 0.0–0.5)
Eosinophils Relative: 2 %
HCT: 35.9 % — ABNORMAL LOW (ref 39.0–52.0)
Hemoglobin: 10.5 g/dL — ABNORMAL LOW (ref 13.0–17.0)
Immature Granulocytes: 0 %
Lymphocytes Relative: 50 %
Lymphs Abs: 5.1 10*3/uL — ABNORMAL HIGH (ref 0.7–4.0)
MCH: 28.3 pg (ref 26.0–34.0)
MCHC: 29.2 g/dL — ABNORMAL LOW (ref 30.0–36.0)
MCV: 96.8 fL (ref 80.0–100.0)
Monocytes Absolute: 0.8 10*3/uL (ref 0.1–1.0)
Monocytes Relative: 8 %
Neutro Abs: 4 10*3/uL (ref 1.7–7.7)
Neutrophils Relative %: 39 %
Platelets: 316 10*3/uL (ref 150–400)
RBC: 3.71 MIL/uL — ABNORMAL LOW (ref 4.22–5.81)
RDW: 19.4 % — ABNORMAL HIGH (ref 11.5–15.5)
WBC: 10.2 10*3/uL (ref 4.0–10.5)
nRBC: 0.9 % — ABNORMAL HIGH (ref 0.0–0.2)

## 2019-06-27 LAB — COMPREHENSIVE METABOLIC PANEL
ALT: 24 U/L (ref 0–44)
AST: 28 U/L (ref 15–41)
Albumin: 3.1 g/dL — ABNORMAL LOW (ref 3.5–5.0)
Alkaline Phosphatase: 88 U/L (ref 38–126)
Anion gap: 18 — ABNORMAL HIGH (ref 5–15)
BUN: 28 mg/dL — ABNORMAL HIGH (ref 6–20)
CO2: 28 mmol/L (ref 22–32)
Calcium: 10.2 mg/dL (ref 8.9–10.3)
Chloride: 94 mmol/L — ABNORMAL LOW (ref 98–111)
Creatinine, Ser: 10.37 mg/dL — ABNORMAL HIGH (ref 0.61–1.24)
GFR calc Af Amer: 6 mL/min — ABNORMAL LOW (ref >60)
GFR calc non Af Amer: 5 mL/min — ABNORMAL LOW (ref >60)
Glucose, Bld: 213 mg/dL — ABNORMAL HIGH (ref 70–99)
Potassium: 3.9 mmol/L (ref 3.5–5.1)
Sodium: 140 mmol/L (ref 135–145)
Total Bilirubin: 0.9 mg/dL (ref 0.3–1.2)
Total Protein: 6.6 g/dL (ref 6.5–8.1)

## 2019-06-27 LAB — D-DIMER, QUANTITATIVE: D-Dimer, Quant: 1.34 ug/mL-FEU — ABNORMAL HIGH (ref 0.00–0.50)

## 2019-06-27 LAB — POC SARS CORONAVIRUS 2 AG -  ED: SARS Coronavirus 2 Ag: NEGATIVE

## 2019-06-27 LAB — FIBRINOGEN: Fibrinogen: 403 mg/dL (ref 210–475)

## 2019-06-27 LAB — TROPONIN I (HIGH SENSITIVITY): Troponin I (High Sensitivity): 112 ng/L (ref <18)

## 2019-06-27 LAB — BRAIN NATRIURETIC PEPTIDE: B Natriuretic Peptide: >4500 pg/mL — ABNORMAL HIGH (ref 0.0–100.0)

## 2019-06-27 LAB — TRIGLYCERIDES: Triglycerides: 100 mg/dL (ref <150)

## 2019-06-27 LAB — LACTIC ACID, PLASMA: Lactic Acid, Venous: 8.2 mmol/L (ref 0.5–1.9)

## 2019-06-27 LAB — LACTATE DEHYDROGENASE: LDH: 195 U/L — ABNORMAL HIGH (ref 98–192)

## 2019-06-27 MED ORDER — VANCOMYCIN HCL IN DEXTROSE 1-5 GM/200ML-% IV SOLN
1000.0000 mg | Freq: Once | INTRAVENOUS | Status: AC
  Administered 2019-06-27: 1000 mg via INTRAVENOUS
  Filled 2019-06-27: qty 200

## 2019-06-27 MED ORDER — SODIUM CHLORIDE 0.9 % IV SOLN
2.0000 g | Freq: Once | INTRAVENOUS | Status: AC
  Administered 2019-06-27: 2 g via INTRAVENOUS
  Filled 2019-06-27: qty 2

## 2019-06-27 NOTE — ED Provider Notes (Signed)
[facility] Emergency Department Provider Note MRN:  [id number]  [place] date & time: 06/27/19     Chief Complaint   Shortness of Breath   History of Present Illness   Troy Wells is a 54 y.o. year-old male with a history of CAD, dialysis, diabetes presenting to the ED with chief complaint of shortness of breath.  Sudden onset shortness of breath earlier today.  Reportedly very hypoxic in the 81s.  Improving in route.  Patient explains that he had Covid back in November but has since tested negative.  He denies any recent fever or cough.  Denies chest pain.  No abdominal pain, no vomiting, no diarrhea.  Review of Systems  A complete 10 system review of systems was obtained and all systems are negative except as noted in the HPI and PMH.   Patient's Health History    Past Medical History:  Diagnosis Date  . CARDIAC ARREST 11/01/2009   Qualifier: Diagnosis of  By: Selena Batten CMA, [place]    . Colon polyps 01/02/2016   Colonoscopy July 2017 - One 3 mm polyp in the transverse colon, removed with a cold biopsy forceps. Resected and retrieved. - One 3 mm polyp in the rectum, removed with a cold biopsy forceps. Resected and retrieved. - Diverticulosis in the entire examined colon. - Non-bleeding internal hemorrhoids. - The examination was otherwise normal. - Significant looping which prolonged cecal    . Coronary artery disease 08/15/2009   with AMI  . Coronary atherosclerosis 11/01/2009   Qualifier: Diagnosis of  By: Selena Batten CMA, Jewel    . Dialysis patient ([place]) 09/27/2014   mon, wed, and fri  . Diverticulosis of colon without hemorrhage 01/02/2016   Colonoscopy July 2017 - One 3 mm polyp in the transverse colon, removed with a cold biopsy forceps. Resected and retrieved. - One 3 mm polyp in the rectum, removed with a cold biopsy forceps. Resected and retrieved. - Diverticulosis in the entire examined colon. - Non-bleeding internal hemorrhoids. - The  examination was otherwise normal. - Significant looping which prolonged cecal    . DM (diabetes mellitus), type 2 with renal complications ([place]) 85/63/1497  . Essential hypertension 11/01/2009   Qualifier: Diagnosis of  By: Selena Batten CMA, Jewel    . Gout 10/21/2018  . History of acute respiratory distress syndrome (ARDS) due to COVID-19 virus   . History of cardiac arrest 04/27/2019  . History of non-ST elevation myocardial infarction (NSTEMI) 04/18/2019  . history of respiratory arrest   . Hyperlipidemia   . Hypertension   . Mitral valve regurgitation 09/19/2015   Echo 09/2015   . 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 OSA treated with BiPAP 06/17/2010  . Sleep apnea    cpap nightly    Past Surgical History:  Procedure Laterality Date  . AV FISTULA PLACEMENT Left 09/27/2014  . INSERTION OF ARTERIOVENOUS (AV) ARTEGRAFT ARM Left 04/02/2018   Procedure: INSERTION OF ARTERIOVENOUS (AV) ARTEGRAFT ARM LEFT UPPER ARM;  Surgeon: Waynetta Sandy, MD;  Location: [place];  Service: Vascular;  Laterality: Left;  . INSERTION OF DIALYSIS CATHETER N/A 04/02/2018   Procedure: INSERTION OF DIALYSIS CATHETER, right internal jugular;  Surgeon: Waynetta Sandy, MD;  Location: [place];  Service: Vascular;  Laterality: N/A;  . NECK SURGERY     C6 & C7 replaced 30 yrs ago per pt  . REVISON OF ARTERIOVENOUS FISTULA Left 04/02/2018   Procedure: REVISION PLICATION OF ARTERIOVENOUS FISTULA ARM;  Surgeon: Waynetta Sandy, MD;  Location: [facility]  OR;  Service: Vascular;  Laterality: Left;  . WISDOM TOOTH EXTRACTION      Family History  Problem Relation Age of Onset  . Hypertension Mother   . Heart disease Mother 66       cause of death  . Hypertension Father   . Aneurysm Father 34       brain aneurysm  . Heart disease Father 28       cause of death  . Hypertension Brother   . Alcohol abuse Brother   . Diabetes Brother   . Pancreatitis Brother   . Hypertension Brother   . Colon cancer Neg Hx      Social History   Socioeconomic History  . Marital status: Single    Spouse name: Not on file  . Number of children: Not on file  . Years of education: Not on file  . Highest education level: Not on file  Occupational History  . Not on file  Tobacco Use  . Smoking status: Former Smoker    Packs/day: 1.00    Years: 10.00    Pack years: 10.00    Types: Cigars    Quit date: 06/18/2011    Years since quitting: 8.0  . Smokeless tobacco: Never Used  Substance and Sexual Activity  . Alcohol use: Yes    Alcohol/week: 1.0 standard drinks    Types: 1 Standard drinks or equivalent per week    Comment: beer or liquor occasionally  . Drug use: No  . Sexual activity: Yes  Other Topics Concern  . Not on file  Social History Narrative   Updated 08/31/15 by Dr. Dossie Arbour    Currently Unemployed. On Disability.   Lives with daughter, grandson, and nephew   Social Determinants of Health   Financial Resource Strain:   . Difficulty of Paying Living Expenses: Not on file  Food Insecurity:   . Worried About Charity fundraiser in the Last Year: Not on file  . Ran Out of Food in the Last Year: Not on file  Transportation Needs:   . Lack of Transportation (Medical): Not on file  . Lack of Transportation (Non-Medical): Not on file  Physical Activity:   . Days of Exercise per Week: Not on file  . Minutes of Exercise per Session: Not on file  Stress:   . Feeling of Stress : Not on file  Social Connections:   . Frequency of Communication with Friends and Family: Not on file  . Frequency of Social Gatherings with Friends and Family: Not on file  . Attends Religious Services: Not on file  . Active Member of Clubs or Organizations: Not on file  . Attends Archivist Meetings: Not on file  . Marital Status: Not on file  Intimate Partner Violence:   . Fear of Current or Ex-Partner: Not on file  . Emotionally Abused: Not on file  . Physically Abused: Not on file  . Sexually Abused:  Not on file     Physical Exam  Vital Signs and Nursing Notes reviewed Vitals:   06/27/19 2245 06/27/19 2310  BP: (!) 160/96   Pulse: 99 99  Resp: (!) 38 (!) 28  Temp:    SpO2: 100% 96%    CONSTITUTIONAL: Ill-appearing, in mild respiratory distress NEURO:  Alert and oriented x 3, no focal deficits EYES:  eyes equal and reactive ENT/NECK:  no LAD, no JVD CARDIO: Tachycardic rate, well-perfused, normal S1 and S2 PULM:  CTAB no wheezing or rhonchi  GI/GU:  normal bowel sounds, non-distended, non-tender MSK/SPINE:  No gross deformities, no edema SKIN:  no rash, atraumatic PSYCH:  Appropriate speech and behavior  Diagnostic and Interventional Summary    EKG Interpretation  Date/Time:  Sunday June 27 2019 22:17:26 EST Ventricular Rate:  121 PR Interval:    QRS Duration: 111 QT Interval:  334 QTC Calculation: 474 R Axis:   -77 Text Interpretation: Sinus tachycardia Probable left atrial enlargement Left anterior fascicular block Probable anteroseptal infarct, old Minimal ST depression, lateral leads Confirmed by Gerlene Fee [phone]) on 06/27/2019 10:27:47 PM      Labs Reviewed  LACTIC ACID, PLASMA - Abnormal; Notable for the following components:      Result Value   Lactic Acid, Venous 8.2 (*)    All other components within normal limits  CBC WITH DIFFERENTIAL/PLATELET - Abnormal; Notable for the following components:   RBC 3.71 (*)    Hemoglobin 10.5 (*)    HCT 35.9 (*)    MCHC 29.2 (*)    RDW 19.4 (*)    nRBC 0.9 (*)    Lymphs Abs 5.1 (*)    All other components within normal limits  D-DIMER, QUANTITATIVE (NOT AT [facility]) - Abnormal; Notable for the following components:   D-Dimer, Quant 1.34 (*)    All other components within normal limits  CULTURE, BLOOD (ROUTINE X 2)  CULTURE, BLOOD (ROUTINE X 2)  RESPIRATORY PANEL BY RT PCR (FLU A&B, COVID)  FIBRINOGEN  LACTIC ACID, PLASMA  COMPREHENSIVE METABOLIC PANEL  PROCALCITONIN  LACTATE DEHYDROGENASE  FERRITIN    TRIGLYCERIDES  C-REACTIVE PROTEIN  BRAIN NATRIURETIC PEPTIDE  POC SARS CORONAVIRUS 2 AG -  ED  TROPONIN I (HIGH SENSITIVITY)    DG Chest Port 1 View  Final Result    CTA Chest for PE    (Results Pending)    Medications  vancomycin (VANCOCIN) IVPB 1000 mg/200 mL premix (1,000 mg Intravenous New Bag/Given 06/27/19 2249)  ceFEPIme (MAXIPIME) 2 g in sodium chloride 0.9 % 100 mL IVPB (2 g Intravenous New Bag/Given 06/27/19 2249)     Procedures  /  Critical Care Ultrasound ED Echo  Date/Time: 06/27/2019 10:39 PM Performed by: Maudie Flakes, MD Authorized by: Maudie Flakes, MD   Procedure details:    Indications: dyspnea     Views: parasternal long axis view and apical 4 chamber view     Images: archived   Findings:    Pericardium: no pericardial effusion     LV Function: depressed (30 - 50%)     RV Diameter: normal   Impression:    Impression: decreased contractility    .Critical Care Performed by: Maudie Flakes, MD Authorized by: Maudie Flakes, MD   Critical care provider statement:    Critical care time (minutes):  40   Critical care was necessary to treat or prevent imminent or life-threatening deterioration of the following conditions:  Respiratory failure   Critical care was time spent personally by me on the following activities:  Discussions with consultants, evaluation of patient's response to treatment, examination of patient, ordering and performing treatments and interventions, ordering and review of laboratory studies, ordering and review of radiographic studies, pulse oximetry, re-evaluation of patient's condition, obtaining history from patient or surrogate and review of old charts    ED Course and Medical Decision Making  I have reviewed the triage vital signs, the nursing notes, and pertinent available records from the EMR.  Pertinent labs & imaging results that were available  during my care of the patient were reviewed by me and considered in my  medical decision making (see below for details).     Initial concern for COVID-19 with profound hypoxia, however with chart review we learned that he recovered from COVID-19 last month and therefore a repeat infection this soon is unlikely.  Considering flash pulmonary edema versus pulmonary embolism given the acute nature of the symptoms and severity of the symptoms.  Bedside ultrasound reveals a poorly squeezing heart without any signs of RV dilation.  Awaiting chest x-ray for further clarification.  Patient doing much better with oxygen supplementation, conversant.  Had also considered sepsis given the hyperthermia, tachycardia, respiratory failure, recent hospitalizations.  Covering with antibiotics.  Lactate significantly elevated at 8, however clinically patient looking much better than he was described in route.  Will repeat lactate.  Still awaiting other labs.  We will continue to monitor closely.  Will need CTA to exclude PE.  Signed out to oncoming provider at shift change.  Barth Kirks. Sedonia Small, [place] [email or web address]  Final Clinical Impressions(s) / ED Diagnoses     ICD-10-CM   1. Acute respiratory failure with hypoxia (HCC)  J96.01     ED Discharge Orders    None       Discharge Instructions Discussed with and Provided to Patient:   Discharge Instructions   None       Maudie Flakes, MD 06/27/19 2330

## 2019-06-27 NOTE — ED Triage Notes (Signed)
Pt coming from [facility] c/o of SHOB. Pt is covid positive. Facility had patient on NRB at [place] at 57% When EMS arrived, 15L NRB brought pt SATS to 88%. Patient became more alert with increased O2 rate.

## 2019-06-27 NOTE — Progress Notes (Signed)
Placed pt. On HFNC. MD request. Pt. Tolerating well at this time.

## 2019-06-28 ENCOUNTER — Telehealth: Payer: Self-pay | Admitting: Internal Medicine

## 2019-06-28 ENCOUNTER — Emergency Department (HOSPITAL_COMMUNITY): Payer: Medicare Other

## 2019-06-28 ENCOUNTER — Inpatient Hospital Stay (HOSPITAL_COMMUNITY): Payer: Medicare Other

## 2019-06-28 DIAGNOSIS — E1129 Type 2 diabetes mellitus with other diabetic kidney complication: Secondary | ICD-10-CM | POA: Diagnosis not present

## 2019-06-28 DIAGNOSIS — G253 Myoclonus: Secondary | ICD-10-CM | POA: Diagnosis present

## 2019-06-28 DIAGNOSIS — B958 Unspecified staphylococcus as the cause of diseases classified elsewhere: Secondary | ICD-10-CM | POA: Diagnosis present

## 2019-06-28 DIAGNOSIS — J810 Acute pulmonary edema: Secondary | ICD-10-CM | POA: Diagnosis not present

## 2019-06-28 DIAGNOSIS — G4733 Obstructive sleep apnea (adult) (pediatric): Secondary | ICD-10-CM | POA: Diagnosis present

## 2019-06-28 DIAGNOSIS — J189 Pneumonia, unspecified organism: Secondary | ICD-10-CM | POA: Diagnosis present

## 2019-06-28 DIAGNOSIS — R0602 Shortness of breath: Secondary | ICD-10-CM | POA: Diagnosis not present

## 2019-06-28 DIAGNOSIS — E8770 Fluid overload, unspecified: Secondary | ICD-10-CM | POA: Diagnosis present

## 2019-06-28 DIAGNOSIS — E669 Obesity, unspecified: Secondary | ICD-10-CM | POA: Diagnosis present

## 2019-06-28 DIAGNOSIS — I2510 Atherosclerotic heart disease of native coronary artery without angina pectoris: Secondary | ICD-10-CM | POA: Diagnosis present

## 2019-06-28 DIAGNOSIS — I340 Nonrheumatic mitral (valve) insufficiency: Secondary | ICD-10-CM

## 2019-06-28 DIAGNOSIS — D631 Anemia in chronic kidney disease: Secondary | ICD-10-CM | POA: Diagnosis not present

## 2019-06-28 DIAGNOSIS — I132 Hypertensive heart and chronic kidney disease with heart failure and with stage 5 chronic kidney disease, or end stage renal disease: Secondary | ICD-10-CM | POA: Diagnosis present

## 2019-06-28 DIAGNOSIS — D638 Anemia in other chronic diseases classified elsewhere: Secondary | ICD-10-CM | POA: Diagnosis present

## 2019-06-28 DIAGNOSIS — Y95 Nosocomial condition: Secondary | ICD-10-CM | POA: Diagnosis present

## 2019-06-28 DIAGNOSIS — Z992 Dependence on renal dialysis: Secondary | ICD-10-CM | POA: Diagnosis not present

## 2019-06-28 DIAGNOSIS — E1169 Type 2 diabetes mellitus with other specified complication: Secondary | ICD-10-CM | POA: Diagnosis present

## 2019-06-28 DIAGNOSIS — N250 Renal osteodystrophy: Secondary | ICD-10-CM | POA: Diagnosis not present

## 2019-06-28 DIAGNOSIS — E1159 Type 2 diabetes mellitus with other circulatory complications: Secondary | ICD-10-CM | POA: Diagnosis not present

## 2019-06-28 DIAGNOSIS — E46 Unspecified protein-calorie malnutrition: Secondary | ICD-10-CM | POA: Diagnosis not present

## 2019-06-28 DIAGNOSIS — N186 End stage renal disease: Secondary | ICD-10-CM | POA: Diagnosis not present

## 2019-06-28 DIAGNOSIS — L89154 Pressure ulcer of sacral region, stage 4: Secondary | ICD-10-CM | POA: Diagnosis present

## 2019-06-28 DIAGNOSIS — I469 Cardiac arrest, cause unspecified: Secondary | ICD-10-CM | POA: Diagnosis not present

## 2019-06-28 DIAGNOSIS — J9601 Acute respiratory failure with hypoxia: Secondary | ICD-10-CM | POA: Diagnosis not present

## 2019-06-28 DIAGNOSIS — E1140 Type 2 diabetes mellitus with diabetic neuropathy, unspecified: Secondary | ICD-10-CM | POA: Diagnosis present

## 2019-06-28 DIAGNOSIS — F419 Anxiety disorder, unspecified: Secondary | ICD-10-CM | POA: Diagnosis present

## 2019-06-28 DIAGNOSIS — B957 Other staphylococcus as the cause of diseases classified elsewhere: Secondary | ICD-10-CM | POA: Diagnosis not present

## 2019-06-28 DIAGNOSIS — G9340 Encephalopathy, unspecified: Secondary | ICD-10-CM | POA: Diagnosis not present

## 2019-06-28 DIAGNOSIS — I120 Hypertensive chronic kidney disease with stage 5 chronic kidney disease or end stage renal disease: Secondary | ICD-10-CM | POA: Diagnosis not present

## 2019-06-28 DIAGNOSIS — Z1159 Encounter for screening for other viral diseases: Secondary | ICD-10-CM | POA: Diagnosis not present

## 2019-06-28 DIAGNOSIS — Z8616 Personal history of COVID-19: Secondary | ICD-10-CM | POA: Diagnosis not present

## 2019-06-28 DIAGNOSIS — N2581 Secondary hyperparathyroidism of renal origin: Secondary | ICD-10-CM | POA: Diagnosis present

## 2019-06-28 DIAGNOSIS — J069 Acute upper respiratory infection, unspecified: Secondary | ICD-10-CM | POA: Diagnosis not present

## 2019-06-28 DIAGNOSIS — I252 Old myocardial infarction: Secondary | ICD-10-CM | POA: Diagnosis not present

## 2019-06-28 DIAGNOSIS — M2550 Pain in unspecified joint: Secondary | ICD-10-CM | POA: Diagnosis not present

## 2019-06-28 DIAGNOSIS — E785 Hyperlipidemia, unspecified: Secondary | ICD-10-CM | POA: Diagnosis present

## 2019-06-28 DIAGNOSIS — I10 Essential (primary) hypertension: Secondary | ICD-10-CM | POA: Diagnosis not present

## 2019-06-28 DIAGNOSIS — R7881 Bacteremia: Secondary | ICD-10-CM | POA: Diagnosis present

## 2019-06-28 DIAGNOSIS — I200 Unstable angina: Secondary | ICD-10-CM | POA: Diagnosis not present

## 2019-06-28 DIAGNOSIS — I5022 Chronic systolic (congestive) heart failure: Secondary | ICD-10-CM | POA: Diagnosis present

## 2019-06-28 DIAGNOSIS — Z7401 Bed confinement status: Secondary | ICD-10-CM | POA: Diagnosis not present

## 2019-06-28 DIAGNOSIS — E872 Acidosis: Secondary | ICD-10-CM | POA: Diagnosis present

## 2019-06-28 DIAGNOSIS — E1122 Type 2 diabetes mellitus with diabetic chronic kidney disease: Secondary | ICD-10-CM | POA: Diagnosis present

## 2019-06-28 LAB — POCT I-STAT EG7
Acid-Base Excess: 13 mmol/L — ABNORMAL HIGH (ref 0.0–2.0)
Bicarbonate: 35.1 mmol/L — ABNORMAL HIGH (ref 20.0–28.0)
Calcium, Ion: 1.04 mmol/L — ABNORMAL LOW (ref 1.15–1.40)
HCT: 30 % — ABNORMAL LOW (ref 39.0–52.0)
Hemoglobin: 10.2 g/dL — ABNORMAL LOW (ref 13.0–17.0)
O2 Saturation: 100 %
Potassium: 3.8 mmol/L (ref 3.5–5.1)
Sodium: 138 mmol/L (ref 135–145)
TCO2: 36 mmol/L — ABNORMAL HIGH (ref 22–32)
pCO2, Ven: 35.1 mmHg — ABNORMAL LOW (ref 44.0–60.0)
pH, Ven: 7.608 (ref 7.250–7.430)
pO2, Ven: 144 mmHg — ABNORMAL HIGH (ref 32.0–45.0)

## 2019-06-28 LAB — GLUCOSE, CAPILLARY
Glucose-Capillary: 123 mg/dL — ABNORMAL HIGH (ref 70–99)
Glucose-Capillary: 125 mg/dL — ABNORMAL HIGH (ref 70–99)
Glucose-Capillary: 143 mg/dL — ABNORMAL HIGH (ref 70–99)
Glucose-Capillary: 157 mg/dL — ABNORMAL HIGH (ref 70–99)
Glucose-Capillary: 96 mg/dL (ref 70–99)
Glucose-Capillary: 96 mg/dL (ref 70–99)

## 2019-06-28 LAB — CBC
HCT: 29.1 % — ABNORMAL LOW (ref 39.0–52.0)
Hemoglobin: 9 g/dL — ABNORMAL LOW (ref 13.0–17.0)
MCH: 27.9 pg (ref 26.0–34.0)
MCHC: 30.9 g/dL (ref 30.0–36.0)
MCV: 90.1 fL (ref 80.0–100.0)
Platelets: 326 10*3/uL (ref 150–400)
RBC: 3.23 MIL/uL — ABNORMAL LOW (ref 4.22–5.81)
RDW: 18.6 % — ABNORMAL HIGH (ref 11.5–15.5)
WBC: 8.8 10*3/uL (ref 4.0–10.5)
nRBC: 0 % (ref 0.0–0.2)

## 2019-06-28 LAB — BASIC METABOLIC PANEL
Anion gap: 14 (ref 5–15)
BUN: 30 mg/dL — ABNORMAL HIGH (ref 6–20)
CO2: 31 mmol/L (ref 22–32)
Calcium: 10.1 mg/dL (ref 8.9–10.3)
Chloride: 94 mmol/L — ABNORMAL LOW (ref 98–111)
Creatinine, Ser: 10.5 mg/dL — ABNORMAL HIGH (ref 0.61–1.24)
GFR calc Af Amer: 6 mL/min — ABNORMAL LOW (ref >60)
GFR calc non Af Amer: 5 mL/min — ABNORMAL LOW (ref >60)
Glucose, Bld: 120 mg/dL — ABNORMAL HIGH (ref 70–99)
Potassium: 4.2 mmol/L (ref 3.5–5.1)
Sodium: 139 mmol/L (ref 135–145)

## 2019-06-28 LAB — ECHOCARDIOGRAM COMPLETE
Height: 71 in
Weight: 3361.57 oz

## 2019-06-28 LAB — HEMOGLOBIN A1C
Hgb A1c MFr Bld: 5.1 % (ref 4.8–5.6)
Mean Plasma Glucose: 99.67 mg/dL

## 2019-06-28 LAB — RESPIRATORY PANEL BY RT PCR (FLU A&B, COVID)
Influenza A by PCR: NEGATIVE
Influenza B by PCR: NEGATIVE
SARS Coronavirus 2 by RT PCR: NEGATIVE

## 2019-06-28 LAB — C-REACTIVE PROTEIN: CRP: 0.8 mg/dL (ref <1.0)

## 2019-06-28 LAB — FERRITIN: Ferritin: 2719 ng/mL — ABNORMAL HIGH (ref 24–336)

## 2019-06-28 LAB — LACTIC ACID, PLASMA: Lactic Acid, Venous: 2.2 mmol/L (ref 0.5–1.9)

## 2019-06-28 LAB — MAGNESIUM: Magnesium: 2 mg/dL (ref 1.7–2.4)

## 2019-06-28 LAB — TROPONIN I (HIGH SENSITIVITY)
Troponin I (High Sensitivity): 312 ng/L (ref <18)
Troponin I (High Sensitivity): 365 ng/L (ref <18)
Troponin I (High Sensitivity): 354 ng/L (ref <18)

## 2019-06-28 LAB — PROCALCITONIN: Procalcitonin: 0.17 ng/mL

## 2019-06-28 LAB — HEPATITIS B SURFACE ANTIGEN: Hepatitis B Surface Ag: NONREACTIVE

## 2019-06-28 LAB — PHOSPHORUS: Phosphorus: 5.2 mg/dL — ABNORMAL HIGH (ref 2.5–4.6)

## 2019-06-28 LAB — MRSA PCR SCREENING: MRSA by PCR: NEGATIVE

## 2019-06-28 MED ORDER — ALLOPURINOL 100 MG PO TABS
100.0000 mg | ORAL_TABLET | Freq: Two times a day (BID) | ORAL | Status: DC
  Administered 2019-06-28 – 2019-07-01 (×8): 100 mg via ORAL
  Filled 2019-06-28 (×9): qty 1

## 2019-06-28 MED ORDER — SODIUM CHLORIDE 0.9 % IV SOLN
2.0000 g | INTRAVENOUS | Status: DC
  Administered 2019-06-28: 2 g via INTRAVENOUS
  Filled 2019-06-28 (×2): qty 2

## 2019-06-28 MED ORDER — PENTAFLUOROPROP-TETRAFLUOROETH EX AERO: 1.0000 "application " | INHALATION_SPRAY | CUTANEOUS | Status: DC | PRN

## 2019-06-28 MED ORDER — IOHEXOL 350 MG/ML SOLN
75.0000 mL | Freq: Once | INTRAVENOUS | Status: AC | PRN
  Administered 2019-06-28: 75 mL via INTRAVENOUS

## 2019-06-28 MED ORDER — VANCOMYCIN HCL IN DEXTROSE 1-5 GM/200ML-% IV SOLN
1000.0000 mg | Freq: Once | INTRAVENOUS | Status: AC
  Administered 2019-06-28: 1000 mg via INTRAVENOUS
  Filled 2019-06-28: qty 200

## 2019-06-28 MED ORDER — CHLORHEXIDINE GLUCONATE CLOTH 2 % EX PADS
6.0000 | MEDICATED_PAD | Freq: Every day | CUTANEOUS | Status: DC
  Administered 2019-06-28 – 2019-07-01 (×4): 6 via TOPICAL

## 2019-06-28 MED ORDER — ALTEPLASE 2 MG IJ SOLR: 2.0000 mg | Freq: Once | INTRAMUSCULAR | Status: DC | PRN

## 2019-06-28 MED ORDER — SODIUM CHLORIDE 0.9 % IV SOLN: 100.0000 mL | INTRAVENOUS | Status: DC | PRN

## 2019-06-28 MED ORDER — LIDOCAINE-PRILOCAINE 2.5-2.5 % EX CREA: 1.0000 "application " | TOPICAL_CREAM | CUTANEOUS | Status: DC | PRN

## 2019-06-28 MED ORDER — ORAL CARE MOUTH RINSE
15.0000 mL | Freq: Two times a day (BID) | OROMUCOSAL | Status: DC
  Administered 2019-06-28 – 2019-07-01 (×3): 15 mL via OROMUCOSAL

## 2019-06-28 MED ORDER — FUROSEMIDE 10 MG/ML IJ SOLN
40.0000 mg | Freq: Once | INTRAMUSCULAR | Status: AC
  Administered 2019-06-28: 40 mg via INTRAVENOUS
  Filled 2019-06-28: qty 4

## 2019-06-28 MED ORDER — ACETAMINOPHEN 325 MG PO TABS
650.0000 mg | ORAL_TABLET | ORAL | Status: AC | PRN
  Administered 2019-06-28 – 2019-07-01 (×6): 650 mg via ORAL
  Filled 2019-06-28 (×6): qty 2

## 2019-06-28 MED ORDER — ASPIRIN 81 MG PO CHEW
81.0000 mg | CHEWABLE_TABLET | Freq: Every day | ORAL | Status: DC
  Administered 2019-06-28 – 2019-07-01 (×3): 81 mg via ORAL
  Filled 2019-06-28 (×3): qty 1

## 2019-06-28 MED ORDER — POLYETHYLENE GLYCOL 3350 17 G PO PACK: 17.0000 g | PACK | Freq: Every day | ORAL | Status: DC | PRN

## 2019-06-28 MED ORDER — HEPARIN SODIUM (PORCINE) 1000 UNIT/ML DIALYSIS
6000.0000 [IU] | Freq: Once | INTRAMUSCULAR | Status: DC
  Filled 2019-06-28: qty 6

## 2019-06-28 MED ORDER — INSULIN ASPART 100 UNIT/ML ~~LOC~~ SOLN
0.0000 [IU] | SUBCUTANEOUS | Status: DC
  Administered 2019-07-01: 1 [IU] via SUBCUTANEOUS
  Administered 2019-07-01: 2 [IU] via SUBCUTANEOUS

## 2019-06-28 MED ORDER — LIDOCAINE HCL (PF) 1 % IJ SOLN: 5.0000 mL | INTRAMUSCULAR | Status: DC | PRN

## 2019-06-28 MED ORDER — MIRTAZAPINE 15 MG PO TABS
15.0000 mg | ORAL_TABLET | Freq: Every day | ORAL | Status: DC
  Administered 2019-06-28: 15 mg via ORAL
  Filled 2019-06-28: qty 1

## 2019-06-28 MED ORDER — SENNA 8.6 MG PO TABS: 1.0000 | ORAL_TABLET | Freq: Every day | ORAL | Status: DC | PRN

## 2019-06-28 MED ORDER — HEPARIN SODIUM (PORCINE) 1000 UNIT/ML DIALYSIS
1000.0000 [IU] | INTRAMUSCULAR | Status: DC | PRN
  Filled 2019-06-28: qty 1

## 2019-06-28 MED ORDER — VANCOMYCIN HCL IN DEXTROSE 1-5 GM/200ML-% IV SOLN
1000.0000 mg | INTRAVENOUS | Status: DC
  Administered 2019-06-28: 1000 mg via INTRAVENOUS
  Filled 2019-06-28 (×2): qty 200

## 2019-06-28 MED ORDER — HEPARIN SODIUM (PORCINE) 5000 UNIT/ML IJ SOLN
5000.0000 [IU] | Freq: Two times a day (BID) | INTRAMUSCULAR | Status: DC
  Administered 2019-06-28 – 2019-07-01 (×8): 5000 [IU] via SUBCUTANEOUS
  Filled 2019-06-28 (×8): qty 1

## 2019-06-28 NOTE — Progress Notes (Signed)
Notified bedside nurse of need to draw lactic acid. Informed pt is difficult stick and several unsussessful attempts.

## 2019-06-28 NOTE — Consult Note (Signed)
Troy Wells Admit Date: 06/27/2019 06/28/2019 Rexene Agent Requesting Physician:  Sedonia Small MD  Reason for Consult:  Hypoxia, SOB, ESRD  HPI:  54 year old male status post recent prolonged hospitalization for COVID-19 infection and its complications who presented overnight to the emergency room with acute onset of dyspnea from sleep.  Required up to 25 L high flow nasal cannula and now has transitioned to BiPAP.  Blood pressures moderately elevated.  Imaging included CTA of the chest which was negative for PE but demonstrated significant diffuse pulmonary edema.  There also was a concern for a lingular pneumonia.  COVID-19 testing negative antigen and PCR.  Flu negative.  Labs are notable for K3.9, BUN 28, HCO3 28.  Hemoglobin is 10.5.  Last HD treatment was 1/8, full in time.  Left 0.2 kg below EDW and IDWG has been reasonable.  Current HD prescription: MWF, 4.5 hours, F200, BFR 450, DFR 800, EDW 99 kg, 2K/2 calcium bath, left upper extremity AV fistula, 6000 units bolus heparin at beginning of treatment and 2500 units bolus mid treatment.   Creat (mg/dL)  Date Value  08/31/2015 9.06 (H)   Creatinine, Ser (mg/dL)  Date Value  06/27/2019 10.37 (H)  06/15/2019 9.27 (H)  06/13/2019 10.84 (H)  06/12/2019 9.35 (H)  06/10/2019 9.50 (H)  06/08/2019 11.11 (H)  06/05/2019 9.03 (H)  06/03/2019 9.44 (H)  06/02/2019 6.98 (H)  06/01/2019 10.56 (H)  ]  ROS Balance of 12 systems is negative w/ exceptions as above  PMH  Past Medical History:  Diagnosis Date  . CARDIAC ARREST 11/01/2009   Qualifier: Diagnosis of  By: Selena Batten CMA, Jewel    . Colon polyps 01/02/2016   Colonoscopy July 2017 - One 3 mm polyp in the transverse colon, removed with a cold biopsy forceps. Resected and retrieved. - One 3 mm polyp in the rectum, removed with a cold biopsy forceps. Resected and retrieved. - Diverticulosis in the entire examined colon. - Non-bleeding internal hemorrhoids. - The examination was  otherwise normal. - Significant looping which prolonged cecal    . Coronary artery disease 08/15/2009   with AMI  . Coronary atherosclerosis 11/01/2009   Qualifier: Diagnosis of  By: Selena Batten CMA, Jewel    . Dialysis patient ([place]) 09/27/2014   mon, wed, and fri  . Diverticulosis of colon without hemorrhage 01/02/2016   Colonoscopy July 2017 - One 3 mm polyp in the transverse colon, removed with a cold biopsy forceps. Resected and retrieved. - One 3 mm polyp in the rectum, removed with a cold biopsy forceps. Resected and retrieved. - Diverticulosis in the entire examined colon. - Non-bleeding internal hemorrhoids. - The examination was otherwise normal. - Significant looping which prolonged cecal    . DM (diabetes mellitus), type 2 with renal complications ([place]) 01/74/9449  . Essential hypertension 11/01/2009   Qualifier: Diagnosis of  By: Selena Batten CMA, Jewel    . Gout 10/21/2018  . History of acute respiratory distress syndrome (ARDS) due to COVID-19 virus   . History of cardiac arrest 04/27/2019  . History of non-ST elevation myocardial infarction (NSTEMI) 04/18/2019  . history of respiratory arrest   . Hyperlipidemia   . Hypertension   . Mitral valve regurgitation 09/19/2015   Echo 09/2015   . 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 OSA treated with BiPAP 06/17/2010  . Sleep apnea    cpap nightly   PSH  Past Surgical History:  Procedure Laterality Date  . AV FISTULA PLACEMENT Left 09/27/2014  . INSERTION OF ARTERIOVENOUS (AV) ARTEGRAFT  ARM Left 04/02/2018   Procedure: INSERTION OF ARTERIOVENOUS (AV) ARTEGRAFT ARM LEFT UPPER ARM;  Surgeon: Waynetta Sandy, MD;  Location: [place];  Service: Vascular;  Laterality: Left;  . INSERTION OF DIALYSIS CATHETER N/A 04/02/2018   Procedure: INSERTION OF DIALYSIS CATHETER, right internal jugular;  Surgeon: Waynetta Sandy, MD;  Location: [place];  Service: Vascular;  Laterality: N/A;  . NECK SURGERY     C6 & C7 replaced 30 yrs ago  per pt  . REVISON OF ARTERIOVENOUS FISTULA Left 04/02/2018   Procedure: REVISION PLICATION OF ARTERIOVENOUS FISTULA ARM;  Surgeon: Waynetta Sandy, MD;  Location: [place];  Service: Vascular;  Laterality: Left;  . WISDOM TOOTH EXTRACTION     FH  Family History  Problem Relation Age of Onset  . Hypertension Mother   . Heart disease Mother 75       cause of death  . Hypertension Father   . Aneurysm Father 14       brain aneurysm  . Heart disease Father 81       cause of death  . Hypertension Brother   . Alcohol abuse Brother   . Diabetes Brother   . Pancreatitis Brother   . Hypertension Brother   . Colon cancer Neg Hx    SH  reports that he quit smoking about 8 years ago. His smoking use included cigars. He has a 10.00 pack-year smoking history. He has never used smokeless tobacco. He reports current alcohol use of about 1.0 standard drinks of alcohol per week. He reports that he does not use drugs. Allergies No Known Allergies Home medications Prior to Admission medications   Medication Sig Start Date End Date Taking? Authorizing Provider  acetaminophen (TYLENOL) 325 MG tablet Take 650 mg by mouth every 6 (six) hours as needed for mild pain.   Yes [provider]  albuterol (VENTOLIN HFA) 108 (90 Base) MCG/ACT inhaler Inhale 1-2 puffs into the lungs every 6 (six) hours as needed for wheezing or shortness of breath. 04/20/19  Yes Daisy Floro, DO  allopurinol (ZYLOPRIM) 100 MG tablet Take 100 mg by mouth 2 (two) times daily.  10/04/14  Yes [provider]  Amino Acids-Protein Hydrolys (FEEDING SUPPLEMENT, PRO-STAT SUGAR FREE 64,) LIQD Take 30 mLs by mouth 2 (two) times daily.   Yes [provider]  aspirin 81 MG EC tablet Take 81 mg by mouth daily. 04/21/19  Yes [provider]  atorvastatin (LIPITOR) 80 MG tablet Take 1 tablet (80 mg total) by mouth daily. 04/21/19  Yes Milus Banister C, DO  bisacodyl (DULCOLAX) 10 MG suppository Place  10 mg rectally daily as needed for moderate constipation (If not relieved by MOM).   Yes [provider]  carvedilol (COREG) 6.25 MG tablet Take 1 tablet (6.25 mg total) by mouth 2 (two) times daily with a meal. 06/16/19 07/16/19 Yes Winfrey, Alcario Drought, MD  cetirizine (ZYRTEC) 5 MG tablet Take 1 tablet (5 mg total) by mouth daily. 10/21/18  Yes [name], MD  clonazePAM (KLONOPIN) 0.25 MG disintegrating tablet Take 1 tablet (0.25 mg total) by mouth 2 (two) times daily. 06/15/19  Yes Welborn, Olaf Mesa, DO  lidocaine (LIDODERM) 5 % Place 1 patch onto the skin daily. Remove & Discard patch within 12 hours or as directed by MD   Yes [provider]  mirtazapine (REMERON) 15 MG tablet Take 1 tablet (15 mg total) by mouth at bedtime. 06/15/19  Yes Lurline Del, DO  Multiple  Vitamin (MULTIVITAMIN WITH MINERALS) TABS tablet Take 1 tablet by mouth daily.   Yes [provider]  polyethylene glycol (MIRALAX / GLYCOLAX) 17 g packet Take 17 g by mouth 2 (two) times daily. Patient taking differently: Take 17 g by mouth 2 (two) times daily as needed for mild constipation.  06/16/19  Yes Winfrey, Alcario Drought, MD  sevelamer carbonate (RENVELA) 800 MG tablet Take 800 mg by mouth 3 (three) times daily with meals.   Yes [provider]  Sodium Phosphates (RA SALINE ENEMA RE) Place 1 each rectally daily as needed (If no BM after Bisacodyl).   Yes [provider]  PRESCRIPTION MEDICATION Pt has CPAP machine    [provider]  senna (SENOKOT) 8.6 MG TABS tablet Take 1 tablet (8.6 mg total) by mouth daily. Patient not taking: Reported on 06/27/2019 06/16/19   Kathrene Alu, MD    Current Medications Scheduled Meds: Continuous Infusions: PRN Meds:.  CBC Recent Labs  Lab 06/27/19 2224 06/28/19 0020  WBC 10.2  --   NEUTROABS 4.0  --   HGB 10.5* 10.2*  HCT 35.9* 30.0*  MCV 96.8  --   PLT 316  --    Basic Metabolic Panel Recent Labs  Lab 06/27/19 2224  06/28/19 0020  NA 140 138  K 3.9 3.8  CL 94*  --   CO2 28  --   GLUCOSE 213*  --   BUN 28*  --   CREATININE 10.37*  --   CALCIUM 10.2  --     Physical Exam  Blood pressure (!) 150/89, pulse (!) 102, temperature (!) 97.1 F (36.2 C), temperature source Temporal, resp. rate (!) 21, height 5\' 11"  (1.803 m), weight 93.9 kg, SpO2 100 %. GEN: On BiPAP, breathing more comfortably ENT: NCAT EYES: EOMI CV: Tachycardic, regular, no rub PULM: Coarse breath sounds bilaterally, air movement throughout ABD: Soft, nontender SKIN: No rashes or lesions CZY:SAYTK lower extremity edema VASCULAR: LUE AVF with bruit and thrill   Assessment 80M with acute SOB, hypoxic RF on BiPAP, pulmonary edema, ESRD  1. ESRD LUE AVF MWF [place] 2. PUlm Edema / Hypoxic RF 3. ?HCAP in lingula s/p vanc/cefepime 4. ANemia, Hb stable 5. HTN< not too bad, follow with UF 6. CKD-BMD 7. Recent COVID19  Plan 1. HD tonight 4.5h, 5-6L UF as BP permits, LUE AVF, bolus heparin, 3K bath   Rexene Agent  [phone] 06/28/2019, 2:30 AM

## 2019-06-28 NOTE — Telephone Encounter (Signed)
Received voicemail at [place] on Sunday night from nurse at [facility] indicating that Mr. Rehm had been sent to the ED in respiratory distress.  sats were 40% and went up to just 60% on nonrebreather mask.  Paramedics had already transported him when nurse called [facility]--then called the backup number instead of the first call number.

## 2019-06-28 NOTE — Progress Notes (Signed)
Pharmacy Antibiotic Note  Jediah Horger is a 54 y.o. male admitted on 06/27/2019 with sepsis.  Pharmacy has been consulted for vancomycin and cefepime dosing.  Plan: Vancomycin 2000mg  x1 then 1000mg  IV every HD.  Goal pre-HD level 15-25 mcg/mL. Cefepime 2g IV now and Q MWF.  Height: 5\' 11"  (180.3 cm) Weight: 207 lb (93.9 kg) IBW/kg (Calculated) : 75.3  Temp (24hrs), Avg:97.1 F (36.2 C), Min:97.1 F (36.2 C), Max:97.1 F (36.2 C)  Recent Labs  Lab 06/27/19 2224  WBC 10.2  CREATININE 10.37*  LATICACIDVEN 8.2*    Estimated Creatinine Clearance: 9.5 mL/min (A) (by C-G formula based on SCr of 10.37 mg/dL (H)).    No Known Allergies   Thank you for allowing pharmacy to be a part of this patient's care.  Wynona Neat, PharmD, BCPS  06/28/2019 2:47 AM

## 2019-06-28 NOTE — ED Notes (Signed)
Dr Randal Buba informed of EG7 results

## 2019-06-28 NOTE — Social Work (Signed)
CSW acknowledging pt from [facility].  Pt is HD pt, goes MWF at 5:40am for a 6am chair time. Pt has been limited in the past due to HD needs with SNF choices.    Westley Hummer, MSW, [place] Work

## 2019-06-28 NOTE — Progress Notes (Signed)
eLink Physician-Brief Progress Note Patient Name: Troy Wells DOB: 07-21-1965 MRN: [id number]   Date of Service  06/28/2019  HPI/Events of Note  Pain around area of sacral decubitus ulcer  eICU Interventions  Tylenol 650 mg po Q 4 hours prn pain        Frederik Pear 06/28/2019, 8:41 PM

## 2019-06-28 NOTE — Progress Notes (Signed)
Attempted echo.  Patient currently doing dialysis.  Will attempt later

## 2019-06-28 NOTE — Progress Notes (Signed)
RT set up CPAP and placed on patient.  Patient tolerating CPAP settings well at this time.  No respiratory distress noted. RT will monitor as needed.

## 2019-06-28 NOTE — Progress Notes (Signed)
Pt arrived to 6N27 via bed from 4N. Received report from [place], Therapist, sports. See assessment. Will continue to monitor.

## 2019-06-28 NOTE — Progress Notes (Signed)
PCCM progress note  Patient admitted for significant dyspnea and hypoxia requiring application of BiPAP on admission due to significant pulmonary edema.  Please see admission H&P for further details. This morning patient remained stable, currently on 4 L nasal cannula and tolerating IHD well.  He denies any recurrent dyspnea and is requesting something to eat this morning.  Given improved oxygen demand and significant volume removal of 5.5 L with HD will transfer patient out of ICU.  Hopeful discharge in the next 24 to 48 hours.  Johnsie Cancel, NP-C [facility] Contact / Pager information can be found on Amion  06/28/2019, 11:48 AM

## 2019-06-28 NOTE — Progress Notes (Signed)
Dressing changed as ordered. Pt tolerated well. Will continue to monitor.

## 2019-06-28 NOTE — H&P (Signed)
NAME:  Troy Wells, MRN:  [id number], DOB:  [birth year], LOS: 0 ADMISSION DATE:  06/27/2019, CONSULTATION DATE:  06/28/19 REFERRING MD:  Sedonia Small, CHIEF COMPLAINT:  Shortness of breath   Brief History   54 year old male with past medical history of CAD, HFrEF, ESRD, hypertension, diabetes, recent prolonged hospitalization 10/28-12/30/20 for Covid-19 pneumonia and ARDS who woke up in the night and felt very short of breath.  He was very hypoxic on EMS arrival with oxygen sats reportedly in the 50s proved on BiPAP.  Work-up significant for pulmonary edema versus pneumonia.  PCCM consulted for admission  History of present illness   [name] is a 54 year old male past medical history of CAD, ESRD, HFrEF, hypertension, diabetes, recent prolonged hospitalization 10/28-12/30/20 for Covid-19 pneumonia and ARDS lives at home alone despite being unable to walk.  He he noted nonproductive cough and denied any recent chest pain, lower extremity edema, fevers, nausea vomiting, or abdominal pain.  He goes to dialysis M/W/F and has not missed any sessions.  He is a prior smoker and wears CPAP at night.  He was initially very hypoxic on EMS arrival, improved in the ED with oxygen supplementation and BiPAP.  Vitals significant for hypothermia and tachycardia with labs significant for lactic acid of 8.2, BNP greater than 4500, respiratory viral panel negative, VBG pH 7.6, PCO2 35.  CT chest was negative for PE, notable for diffuse pulmonary edema with consolidation in the lingula.  He was treated with vancomycin and cefepime and PCCM consulted for admission.  At the time of exam patient was awake and alert and tolerating BiPAP well with improved shortness of breath.  Past Medical History   has a past medical history of CARDIAC ARREST (11/01/2009), Colon polyps (01/02/2016), Coronary artery disease (08/15/2009), Coronary atherosclerosis (11/01/2009), Dialysis patient ([place]) (09/27/2014), Diverticulosis of colon without  hemorrhage (01/02/2016), DM (diabetes mellitus), type 2 with renal complications ([place]) (23/76/2831), Essential hypertension (11/01/2009), Gout (10/21/2018), History of acute respiratory distress syndrome (ARDS) due to COVID-19 virus, History of cardiac arrest (04/27/2019), History of non-ST elevation myocardial infarction (NSTEMI) (04/18/2019), history of respiratory arrest, Hyperlipidemia, Hypertension, Mitral valve regurgitation (09/19/2015), 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   Significant Hospital Events   06/27/18 Admit to PCCM  Consults:  Nephrology  Procedures:    Significant Diagnostic Tests:  1/11 CT chest>>Diffuse pulmonary edema. Small area of consolidation in the lingula, which may indicate pneumonia.  Micro Data:  1/11 BCx2>> 1/11 Sars-Cov-2 and influenza>>negative  1/11 Respiratory culture>>  Antimicrobials:  Vancomycin 1/10- Cefepime 1/10-  Interim history/subjective:  Patient's respiratory status improved on BiPAP  Objective   Blood pressure (!) 150/89, pulse (!) 102, temperature (!) 97.1 F (36.2 C), temperature source Temporal, resp. rate (!) 21, height 5\' 11"  (1.803 m), weight 93.9 kg, SpO2 100 %.    FiO2 (%):  [70 %] 70 %   Intake/Output Summary (Last 24 hours) at 06/28/2019 0232 Last data filed at 06/27/2019 2353 Gross per 24 hour  Intake 300 ml  Output --  Net 300 ml   Filed Weights   06/27/19 2216  Weight: 93.9 kg    General: Elderly, well-nourished, chronically ill-appearing male in no acute distress HEENT: MM pink/moist Neuro: Alert and oriented x4, no focal deficits CV: s1s2 tachycardic, regular rate and rhythm, no m/r/g PULM: Bilateral crackles GI: soft, bsx4 active  Extremities: warm/dry, no edema  Skin: no rashes, sacral bedsore that developed during last admission noted as below, no surrounding induration or fluctuance  Resolved Hospital Problem list     Assessment & Plan:    Acute hypoxic respiratory failure secondary to pulmonary edema vs pneumonia versus post-Covid ARDS picture -Continue BiPAP with intermittent high flow nasal cannula for breaks -Trend lactic acid, continue empiric coverage with vancomycin and cefepime -Follow-up blood and sputum cultures -Pulmonary toilet  ESRD -Nephrology following, blood pressure stable to tolerate HD -Follow electrolytes  Type 2 diabetes -Sliding scale insulin -Check hemoglobin A1c  HFrEF, hypertension -Repeat echo, continue Coreg, aspirin statin  Sacral bedsore -Consult wound care   Best practice:  Diet: Diabetic Pain/Anxiety/Delirium protocol (if indicated): N/A VAP protocol (if indicated): N/A DVT prophylaxis: Heparin GI prophylaxis: N/A Glucose control: SSI Mobility: Bedrest Code Status: Full code, discussed with patient  family Communication: Stated he had already spoken with them Disposition:   Labs   CBC: Recent Labs  Lab 06/27/19 2224 06/28/19 0020  WBC 10.2  --   NEUTROABS 4.0  --   HGB 10.5* 10.2*  HCT 35.9* 30.0*  MCV 96.8  --   PLT 316  --     Basic Metabolic Panel: Recent Labs  Lab 06/27/19 2224 06/28/19 0020  NA 140 138  K 3.9 3.8  CL 94*  --   CO2 28  --   GLUCOSE 213*  --   BUN 28*  --   CREATININE 10.37*  --   CALCIUM 10.2  --    GFR: Estimated Creatinine Clearance: 9.5 mL/min (A) (by C-G formula based on SCr of 10.37 mg/dL (H)). Recent Labs  Lab 06/27/19 2224  PROCALCITON 0.17  WBC 10.2  LATICACIDVEN 8.2*    Liver Function Tests: Recent Labs  Lab 06/27/19 2224  AST 28  ALT 24  ALKPHOS 88  BILITOT 0.9  PROT 6.6  ALBUMIN 3.1*   No results for input(s): LIPASE, AMYLASE in the last 168 hours. No results for input(s): AMMONIA in the last 168 hours.  ABG    Component Value Date/Time   PHART 7.389 05/19/2019 2046   PCO2ART 40.2 05/19/2019 2046   PO2ART 54.3 (L) 05/19/2019 2046   HCO3 35.1 (H) 06/28/2019 0020   TCO2 36 (H) 06/28/2019 0020    ACIDBASEDEF 0.5 05/19/2019 2046   O2SAT 100.0 06/28/2019 0020     Coagulation Profile: No results for input(s): INR, PROTIME in the last 168 hours.  Cardiac Enzymes: No results for input(s): CKTOTAL, CKMB, CKMBINDEX, TROPONINI in the last 168 hours.  HbA1C: Hemoglobin-A1c  Date/Time Value Ref Range Status  09/14/2009 09:43 AM 8.5 (H) 5.4 - 7.4 % Final   Hemoglobin A1C  Date/Time Value Ref Range Status  11/21/2016 02:33 PM 5.3  Final  04/02/2016 10:05 AM 6.1  Final   HbA1c, POC (controlled diabetic range)  Date/Time Value Ref Range Status  10/21/2018 11:25 AM 5.7 0.0 - 7.0 % Final   Hgb A1c MFr Bld  Date/Time Value Ref Range Status  04/14/2019 02:00 AM 5.8 (H) 4.8 - 5.6 % Final    Comment:    (NOTE) Pre diabetes:          5.7%-6.4% Diabetes:              >6.4% Glycemic control for   <7.0% adults with diabetes     CBG: No results for input(s): GLUCAP in the last 168 hours.  Review of Systems:   Negative except as noted in HPI  Past Medical History  He,  has a past medical history of CARDIAC ARREST (11/01/2009), Colon polyps (01/02/2016), Coronary artery disease (08/15/2009),  Coronary atherosclerosis (11/01/2009), Dialysis patient [facility]) (09/27/2014), Diverticulosis of colon without hemorrhage (01/02/2016), DM (diabetes mellitus), type 2 with renal complications ([place]) (97/67/3419), Essential hypertension (11/01/2009), Gout (10/21/2018), History of acute respiratory distress syndrome (ARDS) due to COVID-19 virus, History of cardiac arrest (04/27/2019), History of non-ST elevation myocardial infarction (NSTEMI) (04/18/2019), history of respiratory arrest, Hyperlipidemia, Hypertension, Mitral valve regurgitation (09/19/2015), 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   Surgical History    Past Surgical History:  Procedure Laterality Date  . AV FISTULA PLACEMENT Left 09/27/2014  . INSERTION OF ARTERIOVENOUS (AV) ARTEGRAFT ARM Left  04/02/2018   Procedure: INSERTION OF ARTERIOVENOUS (AV) ARTEGRAFT ARM LEFT UPPER ARM;  Surgeon: Waynetta Sandy, MD;  Location: [place];  Service: Vascular;  Laterality: Left;  . INSERTION OF DIALYSIS CATHETER N/A 04/02/2018   Procedure: INSERTION OF DIALYSIS CATHETER, right internal jugular;  Surgeon: Waynetta Sandy, MD;  Location: [place];  Service: Vascular;  Laterality: N/A;  . NECK SURGERY     C6 & C7 replaced 30 yrs ago per pt  . REVISON OF ARTERIOVENOUS FISTULA Left 04/02/2018   Procedure: REVISION PLICATION OF ARTERIOVENOUS FISTULA ARM;  Surgeon: Waynetta Sandy, MD;  Location: [place];  Service: Vascular;  Laterality: Left;  . WISDOM TOOTH EXTRACTION       Social History   reports that he quit smoking about 8 years ago. His smoking use included cigars. He has a 10.00 pack-year smoking history. He has never used smokeless tobacco. He reports current alcohol use of about 1.0 standard drinks of alcohol per week. He reports that he does not use drugs.   Family History   His family history includes Alcohol abuse in his brother; Aneurysm (age of onset: 72) in his father; Diabetes in his brother; Heart disease (age of onset: 29) in his mother; Heart disease (age of onset: 45) in his father; Hypertension in his brother, brother, father, and mother; Pancreatitis in his brother. There is no history of Colon cancer.   Allergies No Known Allergies   Home Medications  Prior to Admission medications   Medication Sig Start Date End Date Taking? Authorizing Provider  acetaminophen (TYLENOL) 325 MG tablet Take 650 mg by mouth every 6 (six) hours as needed for mild pain.   Yes [provider]  albuterol (VENTOLIN HFA) 108 (90 Base) MCG/ACT inhaler Inhale 1-2 puffs into the lungs every 6 (six) hours as needed for wheezing or shortness of breath. 04/20/19  Yes Daisy Floro, DO  allopurinol (ZYLOPRIM) 100 MG tablet Take 100 mg by mouth 2 (two) times daily.  10/04/14   Yes [provider]  Amino Acids-Protein Hydrolys (FEEDING SUPPLEMENT, PRO-STAT SUGAR FREE 64,) LIQD Take 30 mLs by mouth 2 (two) times daily.   Yes [provider]  aspirin 81 MG EC tablet Take 81 mg by mouth daily. 04/21/19  Yes [provider]  atorvastatin (LIPITOR) 80 MG tablet Take 1 tablet (80 mg total) by mouth daily. 04/21/19  Yes Milus Banister C, DO  bisacodyl (DULCOLAX) 10 MG suppository Place 10 mg rectally daily as needed for moderate constipation (If not relieved by MOM).   Yes [provider]  carvedilol (COREG) 6.25 MG tablet Take 1 tablet (6.25 mg total) by mouth 2 (two) times daily with a meal. 06/16/19 07/16/19 Yes Winfrey, Alcario Drought, MD  cetirizine (ZYRTEC) 5 MG tablet Take 1 tablet (5 mg total) by mouth daily. 10/21/18  Yes [name], MD  clonazePAM Bobbye [place])  0.25 MG disintegrating tablet Take 1 tablet (0.25 mg total) by mouth 2 (two) times daily. 06/15/19  Yes Welborn, Ryan, DO  lidocaine (LIDODERM) 5 % Place 1 patch onto the skin daily. Remove & Discard patch within 12 hours or as directed by MD   Yes [provider]  mirtazapine (REMERON) 15 MG tablet Take 1 tablet (15 mg total) by mouth at bedtime. 06/15/19  Yes Lurline Del, DO  Multiple Vitamin (MULTIVITAMIN WITH MINERALS) TABS tablet Take 1 tablet by mouth daily.   Yes [provider]  polyethylene glycol (MIRALAX / GLYCOLAX) 17 g packet Take 17 g by mouth 2 (two) times daily. Patient taking differently: Take 17 g by mouth 2 (two) times daily as needed for mild constipation.  06/16/19  Yes Winfrey, Alcario Drought, MD  sevelamer carbonate (RENVELA) 800 MG tablet Take 800 mg by mouth 3 (three) times daily with meals.   Yes [provider]  Sodium Phosphates (RA SALINE ENEMA RE) Place 1 each rectally daily as needed (If no BM after Bisacodyl).   Yes [provider]  PRESCRIPTION MEDICATION Pt has CPAP machine    [provider]  senna (SENOKOT)  8.6 MG TABS tablet Take 1 tablet (8.6 mg total) by mouth daily. Patient not taking: Reported on 06/27/2019 06/16/19   Kathrene Alu, MD     Critical care time: 65 minutes   CRITICAL CARE Performed by: Otilio Carpen Cleon Signorelli   Total critical care time: 65 minutes  Critical care time was exclusive of separately billable procedures and treating other patients.  Critical care was necessary to treat or prevent imminent or life-threatening deterioration.  Critical care was time spent personally by me on the following activities: development of treatment plan with patient and/or surrogate as well as nursing, discussions with consultants, evaluation of patient's response to treatment, examination of patient, obtaining history from patient or surrogate, ordering and performing treatments and interventions, ordering and review of laboratory studies, ordering and review of radiographic studies, pulse oximetry and re-evaluation of patient's condition.   Otilio Carpen Luzmaria Devaux, PA-C [place] [facility]  Pager# [phone], if no answer [phone]

## 2019-06-28 NOTE — Progress Notes (Signed)
  Echocardiogram 2D Echocardiogram has been performed.  Troy Wells M 06/28/2019, 11:10 AM

## 2019-06-28 NOTE — Consult Note (Signed)
[facility] Nurse Consult Note: Patient receiving care at [facility] room [phone] Reason for Consult:Stage 4 sacral decubitus  Wound type:Healing stage 4 PI Pressure Injury POA: Yes Measurement:6cm x 3cm x 4.2cm Wound bed: 100 percent pink granulation Drainage (amount, consistency, odor) yellow green drainage on existing gauze drainage. Periwound:Pink and blanchable Dressing procedure/placement/frequency:Place saline moistened gauze into the wound bed including the undermined areas.  Top with gauze 4x4 and tape. Plan of care discussed with patient WOC will not follow.  Please re-consult if needed.  Matilde Sprang, RN, WTA-C

## 2019-06-28 NOTE — Plan of Care (Signed)
  Problem: Clinical Measurements: Goal: Respiratory complications will improve Outcome: Progressing   

## 2019-06-28 NOTE — Progress Notes (Signed)
Pt. Placed on bipap per MD.

## 2019-06-28 NOTE — ED Notes (Signed)
Unsuccessful blood draw attempt. RN Janett Billow informed

## 2019-06-28 NOTE — Progress Notes (Signed)
eLink Physician-Brief Progress Note Patient Name: Troy Wells DOB: 23-Oct-1965 MRN: [id number]   Date of Service  06/28/2019  HPI/Events of Note  65M with ESRD here with volume overload, pulmonary edema, hypoxemia, with possible minor contribution to respiratory status from pneumonia although this is less certain to be the case (no fever, no leukocytosis, pt non-toxic in appearance).  eICU Interventions  I am in agreement with the plan outlined in the notes by Dr. Verlee Monte and Mickel Baas Gleason NP.  HD for ultrafiltration. Pt is on circuit currently.  Vanc/cefepime for possible pneumonia.  HFNC or [place] for hypoxemia.  DVT PPX: Heparin [place]      Intervention Category Evaluation Type: New Patient Evaluation  Charlott Rakes 06/28/2019, 5:36 AM

## 2019-06-29 ENCOUNTER — Other Ambulatory Visit: Payer: Self-pay

## 2019-06-29 ENCOUNTER — Encounter (HOSPITAL_COMMUNITY): Payer: Self-pay | Admitting: Student

## 2019-06-29 DIAGNOSIS — J810 Acute pulmonary edema: Secondary | ICD-10-CM

## 2019-06-29 DIAGNOSIS — N186 End stage renal disease: Secondary | ICD-10-CM

## 2019-06-29 DIAGNOSIS — I10 Essential (primary) hypertension: Secondary | ICD-10-CM

## 2019-06-29 DIAGNOSIS — Z992 Dependence on renal dialysis: Secondary | ICD-10-CM

## 2019-06-29 LAB — BLOOD CULTURE ID PANEL (REFLEXED)

## 2019-06-29 LAB — CBC
HCT: 31.8 % — ABNORMAL LOW (ref 39.0–52.0)
Hemoglobin: 9.9 g/dL — ABNORMAL LOW (ref 13.0–17.0)
MCH: 27.9 pg (ref 26.0–34.0)
MCHC: 31.1 g/dL (ref 30.0–36.0)
MCV: 89.6 fL (ref 80.0–100.0)
Platelets: 310 10*3/uL (ref 150–400)
RBC: 3.55 MIL/uL — ABNORMAL LOW (ref 4.22–5.81)
RDW: 18.6 % — ABNORMAL HIGH (ref 11.5–15.5)
WBC: 6.9 10*3/uL (ref 4.0–10.5)
nRBC: 0.4 % — ABNORMAL HIGH (ref 0.0–0.2)

## 2019-06-29 LAB — BASIC METABOLIC PANEL
Anion gap: 14 (ref 5–15)
BUN: 26 mg/dL — ABNORMAL HIGH (ref 6–20)
CO2: 27 mmol/L (ref 22–32)
Calcium: 10.2 mg/dL (ref 8.9–10.3)
Chloride: 99 mmol/L (ref 98–111)
Creatinine, Ser: 8.12 mg/dL — ABNORMAL HIGH (ref 0.61–1.24)
GFR calc Af Amer: 8 mL/min — ABNORMAL LOW (ref >60)
GFR calc non Af Amer: 7 mL/min — ABNORMAL LOW (ref >60)
Glucose, Bld: 110 mg/dL — ABNORMAL HIGH (ref 70–99)
Potassium: 3.9 mmol/L (ref 3.5–5.1)
Sodium: 140 mmol/L (ref 135–145)

## 2019-06-29 LAB — RENAL FUNCTION PANEL
Albumin: 2.9 g/dL — ABNORMAL LOW (ref 3.5–5.0)
Anion gap: 14 (ref 5–15)
BUN: 29 mg/dL — ABNORMAL HIGH (ref 6–20)
CO2: 28 mmol/L (ref 22–32)
Calcium: 10.7 mg/dL — ABNORMAL HIGH (ref 8.9–10.3)
Chloride: 98 mmol/L (ref 98–111)
Creatinine, Ser: 9.22 mg/dL — ABNORMAL HIGH (ref 0.61–1.24)
GFR calc Af Amer: 7 mL/min — ABNORMAL LOW (ref >60)
GFR calc non Af Amer: 6 mL/min — ABNORMAL LOW (ref >60)
Glucose, Bld: 137 mg/dL — ABNORMAL HIGH (ref 70–99)
Phosphorus: 4.5 mg/dL (ref 2.5–4.6)
Potassium: 4.1 mmol/L (ref 3.5–5.1)
Sodium: 140 mmol/L (ref 135–145)

## 2019-06-29 LAB — GLUCOSE, CAPILLARY
Glucose-Capillary: 103 mg/dL — ABNORMAL HIGH (ref 70–99)
Glucose-Capillary: 110 mg/dL — ABNORMAL HIGH (ref 70–99)
Glucose-Capillary: 117 mg/dL — ABNORMAL HIGH (ref 70–99)
Glucose-Capillary: 101 mg/dL — ABNORMAL HIGH (ref 70–99)
Glucose-Capillary: 102 mg/dL — ABNORMAL HIGH (ref 70–99)

## 2019-06-29 MED ORDER — FERRIC CITRATE 1 GM 210 MG(FE) PO TABS
420.0000 mg | ORAL_TABLET | Freq: Three times a day (TID) | ORAL | Status: DC
  Administered 2019-06-29 – 2019-07-01 (×7): 420 mg via ORAL
  Filled 2019-06-29 (×9): qty 2

## 2019-06-29 MED ORDER — AMOXICILLIN-POT CLAVULANATE 500-125 MG PO TABS
1.0000 | ORAL_TABLET | ORAL | Status: DC
  Administered 2019-06-29: 500 mg via ORAL
  Filled 2019-06-29: qty 1

## 2019-06-29 MED ORDER — IPRATROPIUM-ALBUTEROL 0.5-2.5 (3) MG/3ML IN SOLN: 3.0000 mL | RESPIRATORY_TRACT | Status: DC | PRN

## 2019-06-29 MED ORDER — VANCOMYCIN HCL IN DEXTROSE 1-5 GM/200ML-% IV SOLN
1000.0000 mg | INTRAVENOUS | Status: DC
  Filled 2019-06-29: qty 200

## 2019-06-29 NOTE — Progress Notes (Signed)
Called that initial blood cultures growing 3/4 coag negative staph.  Patient does have an indwelling line.  I have asked ID to see patient to see if we can treat through this or if the tunneled line needs to come out.  Vanc restarted.  D/C augmentin.  Will ask [facility] to take over care starting 06/30/2019.  Appreciate help.  Erskine Emery MD PCCM

## 2019-06-29 NOTE — H&P (Addendum)
NAME:  Troy Wells, MRN:  [id number], DOB:  02-Sep-1965, LOS: 1 ADMISSION DATE:  06/27/2019, CONSULTATION DATE:  06/29/19 REFERRING MD:  Troy Wells, CHIEF COMPLAINT:  Shortness of breath   Brief History   54 year old male with past medical history of CAD, HFrEF, ESRD, hypertension, diabetes, recent prolonged hospitalization 10/28-12/30/20 for Covid-19 pneumonia and ARDS who woke up in the night and felt very short of breath.  He was very hypoxic on EMS arrival with oxygen sats reportedly in the 50s proved on BiPAP.  Work-up significant for pulmonary edema versus pneumonia.  PCCM consulted for admission  History of present illness   Troy Wells is a 54 year old male past medical history of CAD, ESRD, HFrEF, hypertension, diabetes, recent prolonged hospitalization 10/28-12/30/20 for Covid-19 pneumonia and ARDS lives at home alone despite being unable to walk.  He he noted nonproductive cough and denied any recent chest pain, lower extremity edema, fevers, nausea vomiting, or abdominal pain.  He goes to dialysis M/W/F and has not missed any sessions.  He is a prior smoker and wears CPAP at night.  He was initially very hypoxic on EMS arrival, improved in the ED with oxygen supplementation and BiPAP.  Vitals significant for hypothermia and tachycardia with labs significant for lactic acid of 8.2, BNP greater than 4500, respiratory viral panel negative, VBG pH 7.6, PCO2 35.  CT chest was negative for PE, notable for diffuse pulmonary edema with consolidation in the lingula.  He was treated with vancomycin and cefepime and PCCM consulted for admission.  At the time of exam patient was awake and alert and tolerating BiPAP well with improved shortness of breath.  Past Medical History   has a past medical history of CARDIAC ARREST (11/01/2009), Colon polyps (01/02/2016), Coronary artery disease (08/15/2009), Coronary atherosclerosis (11/01/2009), Dialysis patient ([place]) (09/27/2014), Diverticulosis of colon without  hemorrhage (01/02/2016), DM (diabetes mellitus), type 2 with renal complications ([place]) (02/54/2706), Essential hypertension (11/01/2009), Gout (10/21/2018), History of acute respiratory distress syndrome (ARDS) due to COVID-19 virus, History of cardiac arrest (04/27/2019), History of non-ST elevation myocardial infarction (NSTEMI) (04/18/2019), history of respiratory arrest, Hyperlipidemia, Hypertension, Mitral valve regurgitation (09/19/2015), 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   Significant Hospital Events   06/27/18 Admit to PCCM  Consults:  Nephrology  Procedures:    Significant Diagnostic Tests:  1/11 CT chest>>Diffuse pulmonary edema. Wells area of consolidation in the lingula, which may indicate pneumonia.  Micro Data:  1/11 BCx2>> NGTD 1/11 Sars-Cov-2 and influenza>>negative  1/11 Respiratory culture>> NGTD   Antimicrobials:  Vancomycin 1/10- Cefepime 1/10-  Interim history/subjective:   Moved from the intensive care unit yesterday.  Overall doing well.  No longer requiring oxygen therapy.  Now on room air.  Assessed by physical therapy this morning.  Still in need of PT assistance.  Needed ambulation with gait belt assistance.  Objective   Blood pressure (!) 146/79, pulse (!) 107, temperature 97.8 F (36.6 C), temperature source Oral, resp. rate 18, height 5\' 11"  (1.803 m), weight 95.7 kg, SpO2 98 %.        Intake/Output Summary (Last 24 hours) at 06/29/2019 1110 Last data filed at 06/28/2019 2204 Gross per 24 hour  Intake 197.28 ml  Output --  Net 197.28 ml   Filed Weights   06/28/19 0515 06/28/19 1009 06/29/19 0500  Weight: 100.8 kg 95.3 kg 95.7 kg    General appearance: 54 y.o., male, NAD, conversant  Eyes: anicteric sclerae, moist conjunctivae; no lid-lag; PERRLA, tracking appropriately HENT:  NCAT; oropharynx, MMM Neck: Trachea midline Lungs: CTAB CV: RRR, S1, S2, no MRGs  Abdomen: Soft, non-tender;  non-distended, BS present  Extremities: No peripheral edema, radial and DP pulses present bilaterally  Skin: Please see sacral decub imaging present on admission. Psych: Appropriate affect Neuro: Alert oriented, tremulous         Resolved Hospital Problem list     Assessment & Plan:   Acute hypoxic respiratory failure secondary to pulmonary edema  post-Covid ARDS in November 2020 Pulmonary edema resolved status post dialysis, volume removal and BiPAP therapy. Wells area of lingular consolidation, possible pneumonia -Patient weaned down to room air. -Antibiotics the escalated from cefepime/vancomycin to complete 5 days of Augmentin.  ESRD on iHD  -IHD per nephrology - h/o of non-compliance  Appreciate their recommendations  Type 2 diabetes -Sliding scale insulin  HFrEF, hypertension, hyperlipidemia -Continue home dose Coreg, aspirin plus statin  Sacral decubitus wound -Wound care consult in place.  Normocytic anemia Likely anemia of chronic disease related to ESRD. -Conservative transfusion threshold, hemoglobin less than 7  Decreased mobility Need for physical therapy assistance -Continue PT -Encouraged nursing staff and nurse tech to get patient up in chair as much as tolerated.  History of gout  -Continue allopurinol  Anticipated discharge 06/30/2019  Best practice:  Diet: Diabetic Pain/Anxiety/Delirium protocol (if indicated): N/A VAP protocol (if indicated): N/A DVT prophylaxis: Heparin GI prophylaxis: N/A Glucose control: SSI Mobility: Bedrest Code Status: Full code, discussed with patient  family Communication: Discussed directly with patient. Disposition:   Labs   CBC: Recent Labs  Lab 06/27/19 2224 06/28/19 0020 06/28/19 0439 06/29/19 0210  WBC 10.2  --  8.8 6.9  NEUTROABS 4.0  --   --   --   HGB 10.5* 10.2* 9.0* 9.9*  HCT 35.9* 30.0* 29.1* 31.8*  MCV 96.8  --  90.1 89.6  PLT 316  --  326 076    Basic Metabolic Panel: Recent Labs   Lab 06/27/19 2224 06/28/19 0020 06/28/19 0439 06/29/19 0210  NA 140 138 139 140  K 3.9 3.8 4.2 3.9  CL 94*  --  94* 99  CO2 28  --  31 27  GLUCOSE 213*  --  120* 110*  BUN 28*  --  30* 26*  CREATININE 10.37*  --  10.50* 8.12*  CALCIUM 10.2  --  10.1 10.2  MG  --   --  2.0  --   PHOS  --   --  5.2*  --    GFR: Estimated Creatinine Clearance: 12.3 mL/min (A) (by C-G formula based on SCr of 8.12 mg/dL (H)). Recent Labs  Lab 06/27/19 2224 06/28/19 0439 06/29/19 0210  PROCALCITON 0.17  --   --   WBC 10.2 8.8 6.9  LATICACIDVEN 8.2* 2.2*  --     Liver Function Tests: Recent Labs  Lab 06/27/19 2224  AST 28  ALT 24  ALKPHOS 88  BILITOT 0.9  PROT 6.6  ALBUMIN 3.1*   No results for input(s): LIPASE, AMYLASE in the last 168 hours. No results for input(s): AMMONIA in the last 168 hours.  ABG    Component Value Date/Time   PHART 7.389 05/19/2019 2046   PCO2ART 40.2 05/19/2019 2046   PO2ART 54.3 (L) 05/19/2019 2046   HCO3 35.1 (H) 06/28/2019 0020   TCO2 36 (H) 06/28/2019 0020   ACIDBASEDEF 0.5 05/19/2019 2046   O2SAT 100.0 06/28/2019 0020     Coagulation Profile: No results for input(s): INR, PROTIME in the last  168 hours.  Cardiac Enzymes: No results for input(s): CKTOTAL, CKMB, CKMBINDEX, TROPONINI in the last 168 hours.  HbA1C: Hemoglobin-A1c  Date/Time Value Ref Range Status  09/14/2009 09:43 AM 8.5 (H) 5.4 - 7.4 % Final   HbA1c, POC (controlled diabetic range)  Date/Time Value Ref Range Status  10/21/2018 11:25 AM 5.7 0.0 - 7.0 % Final   Hgb A1c MFr Bld  Date/Time Value Ref Range Status  06/28/2019 04:39 AM 5.1 4.8 - 5.6 % Final    Comment:    (NOTE) Pre diabetes:          5.7%-6.4% Diabetes:              >6.4% Glycemic control for   <7.0% adults with diabetes   04/14/2019 02:00 AM 5.8 (H) 4.8 - 5.6 % Final    Comment:    (NOTE) Pre diabetes:          5.7%-6.4% Diabetes:              >6.4% Glycemic control for   <7.0% adults with diabetes      CBG: Recent Labs  Lab 06/28/19 1743 06/28/19 1948 06/28/19 2341 06/29/19 0358 06/29/19 0824  GLUCAP 123* 143* 157* 103* 110*    Review of Systems:   Negative except as noted in HPI  Past Medical History  He,  has a past medical history of CARDIAC ARREST (11/01/2009), Colon polyps (01/02/2016), Coronary artery disease (08/15/2009), Coronary atherosclerosis (11/01/2009), Dialysis patient ([place]) (09/27/2014), Diverticulosis of colon without hemorrhage (01/02/2016), DM (diabetes mellitus), type 2 with renal complications ([place]) (62/83/6629), Essential hypertension (11/01/2009), Gout (10/21/2018), History of acute respiratory distress syndrome (ARDS) due to COVID-19 virus, History of cardiac arrest (04/27/2019), History of non-ST elevation myocardial infarction (NSTEMI) (04/18/2019), history of respiratory arrest, Hyperlipidemia, Hypertension, Mitral valve regurgitation (09/19/2015), 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   Surgical History    Past Surgical History:  Procedure Laterality Date  . AV FISTULA PLACEMENT Left 09/27/2014  . INSERTION OF ARTERIOVENOUS (AV) ARTEGRAFT ARM Left 04/02/2018   Procedure: INSERTION OF ARTERIOVENOUS (AV) ARTEGRAFT ARM LEFT UPPER ARM;  Surgeon: Waynetta Sandy, MD;  Location: [facility];  Service: Vascular;  Laterality: Left;  . INSERTION OF DIALYSIS CATHETER N/A 04/02/2018   Procedure: INSERTION OF DIALYSIS CATHETER, right internal jugular;  Surgeon: Waynetta Sandy, MD;  Location: [facility];  Service: Vascular;  Laterality: N/A;  . NECK SURGERY     C6 & C7 replaced 30 yrs ago per pt  . REVISON OF ARTERIOVENOUS FISTULA Left 04/02/2018   Procedure: REVISION PLICATION OF ARTERIOVENOUS FISTULA ARM;  Surgeon: Waynetta Sandy, MD;  Location: [facility] [facility];  Service: Vascular;  Laterality: Left;  . WISDOM TOOTH EXTRACTION       Social History   reports that he quit smoking about 8 years ago. His  smoking use included cigars. He has a 10.00 pack-year smoking history. He has never used smokeless tobacco. He reports current alcohol use of about 1.0 standard drinks of alcohol per week. He reports that he does not use drugs.   Family History   His family history includes Alcohol abuse in his brother; Aneurysm (age of onset: 13) in his father; Diabetes in his brother; Heart disease (age of onset: 36) in his mother; Heart disease (age of onset: 91) in his father; Hypertension in his brother, brother, father, and mother; Pancreatitis in his brother. There is no history of Colon cancer.   Allergies No Known Allergies  Home Medications  Prior to Admission medications   Medication Sig Start Date End Date Taking? Authorizing Provider  acetaminophen (TYLENOL) 325 MG tablet Take 650 mg by mouth every 6 (six) hours as needed for mild pain.   Yes [provider]  albuterol (VENTOLIN HFA) 108 (90 Base) MCG/ACT inhaler Inhale 1-2 puffs into the lungs every 6 (six) hours as needed for wheezing or shortness of breath. 04/20/19  Yes Daisy Floro, DO  allopurinol (ZYLOPRIM) 100 MG tablet Take 100 mg by mouth 2 (two) times daily.  10/04/14  Yes [provider]  Amino Acids-Protein Hydrolys (FEEDING SUPPLEMENT, PRO-STAT SUGAR FREE 64,) LIQD Take 30 mLs by mouth 2 (two) times daily.   Yes [provider]  aspirin 81 MG EC tablet Take 81 mg by mouth daily. 04/21/19  Yes [provider]  atorvastatin (LIPITOR) 80 MG tablet Take 1 tablet (80 mg total) by mouth daily. 04/21/19  Yes Milus Banister C, DO  bisacodyl (DULCOLAX) 10 MG suppository Place 10 mg rectally daily as needed for moderate constipation (If not relieved by MOM).   Yes [provider]  carvedilol (COREG) 6.25 MG tablet Take 1 tablet (6.25 mg total) by mouth 2 (two) times daily with a meal. 06/16/19 07/16/19 Yes Winfrey, Alcario Drought, MD  cetirizine (ZYRTEC) 5 MG tablet Take 1 tablet (5 mg total) by mouth  daily. 10/21/18  Yes [name], MD  clonazePAM (KLONOPIN) 0.25 MG disintegrating tablet Take 1 tablet (0.25 mg total) by mouth 2 (two) times daily. 06/15/19  Yes Welborn, Ryan, DO  lidocaine (LIDODERM) 5 % Place 1 patch onto the skin daily. Remove & Discard patch within 12 hours or as directed by MD   Yes [provider]  mirtazapine (REMERON) 15 MG tablet Take 1 tablet (15 mg total) by mouth at bedtime. 06/15/19  Yes Lurline Del, DO  Multiple Vitamin (MULTIVITAMIN WITH MINERALS) TABS tablet Take 1 tablet by mouth daily.   Yes [provider]  polyethylene glycol (MIRALAX / GLYCOLAX) 17 g packet Take 17 g by mouth 2 (two) times daily. Patient taking differently: Take 17 g by mouth 2 (two) times daily as needed for mild constipation.  06/16/19  Yes Winfrey, Alcario Drought, MD  sevelamer carbonate (RENVELA) 800 MG tablet Take 800 mg by mouth 3 (three) times daily with meals.   Yes [provider]  Sodium Phosphates (RA SALINE ENEMA RE) Place 1 each rectally daily as needed (If no BM after Bisacodyl).   Yes [provider]  PRESCRIPTION MEDICATION Pt has CPAP machine    [provider]  senna (SENOKOT) 8.6 MG TABS tablet Take 1 tablet (8.6 mg total) by mouth daily. Patient not taking: Reported on 06/27/2019 06/16/19   Kathrene Alu, MD     Garner Nash, DO [facility] 06/29/2019 11:10 AM

## 2019-06-29 NOTE — TOC Initial Note (Signed)
Transition of Care [facility]) - Initial/Assessment Note    Patient Details  Name: Troy Wells MRN: [id number] Date of Birth: Dec 18, 1965  Transition of Care [facility]) CM/SW Contact:    Alexander Mt, [place] Phone Number: 06/29/2019, 1:44 PM  Clinical Narrative:                 CSW spoke with pt at bedside. Introduced self, role, and reason for visit. Pt from [place], confirmed that he goes to MWF dialysis still at [facility]. He knows his chair time and states that the center takes him. We discussed the road that brought him back here. Pt states there are some details about his hospital stays that he cant remember.   He has been at [facility] getting therapy- his ultimate goal is to get back home as able with support from his daughter. He is hopeful to also see improvement in his wound.    We discussed barriers to finding SNFs due to dialysis and he is amenable to returning to [place]. Pt called adult daughter Marijo Conception while this Probation officer was in the room and we discussed further rec for rehab- pt progress with therapy team here and barriers to changing SNFs. Pt daughter states understanding, is agreeable to [facility] if necessary for continued care.   CSW and TOC team continue to follow.   Expected Discharge Plan: Skilled Nursing Facility Barriers to Discharge: Continued Medical Work up   Patient Goals and CMS Choice Patient states their goals for this hospitalization and ongoing recovery are:: to get stronger and get home CMS Medicare.gov Compare Post Acute Care list provided to:: Patient Choice offered to / list presented to : Patient, Adult Children  Expected Discharge Plan and Services Expected Discharge Plan: [place] In-house Referral: Clinical Social Work Discharge Planning Services: CM Consult Post Acute Care Choice: [place], Resumption of Svcs/PTA Provider Living arrangements for the past 2 months: [place]  Prior Living  Arrangements/Services Living arrangements for the past 2 months: [place] Lives with:: Facility Resident, Self Patient language and need for interpreter reviewed:: Yes(no needs) Do you feel safe going back to the place where you live?: Yes      Need for Family Participation in Patient Care: Yes (Comment)(support; assistance with ADL/IADLs) Care giver support system in place?: Yes (comment)(pt daughter; SNF staff)   Criminal Activity/Legal Involvement Pertinent to Current Situation/Hospitalization: No - Comment as needed  Activities of Daily Living Home Assistive Devices/Equipment: CPAP, Walker (specify type) ADL Screening (condition at time of admission) Patient's cognitive ability adequate to safely complete daily activities?: Yes Is the patient deaf or have difficulty hearing?: No Does the patient have difficulty seeing, even when wearing glasses/contacts?: No Does the patient have difficulty concentrating, remembering, or making decisions?: No Patient able to express need for assistance with ADLs?: Yes Does the patient have difficulty dressing or bathing?: Yes Independently performs ADLs?: No Communication: Independent Is this a change from baseline?: Pre-admission baseline Dressing (OT): Dependent Is this a change from baseline?: Pre-admission baseline Grooming: Dependent Is this a change from baseline?: Pre-admission baseline Feeding: Independent Is this a change from baseline?: Pre-admission baseline Bathing: Dependent Is this a change from baseline?: Pre-admission baseline Toileting: Dependent Is this a change from baseline?: Pre-admission baseline In/Out Bed: Dependent Is this a change from baseline?: Pre-admission baseline Walks in Home: Needs assistance Is this a change from baseline?: Pre-admission baseline Does the patient have difficulty walking or climbing stairs?: Yes Weakness of Legs: Both Weakness of Arms/Hands:  Both  Permission  Sought/Granted Permission sought to share information with : Facility Sport and exercise psychologist, Family Supports Permission granted to share information with : Yes, Verbal Permission Granted  Share Information with NAME: Luisantonio Adinolfi  Permission granted to share info w AGENCY: SNFs  Permission granted to share info w Relationship: daughter  Permission granted to share info w Contact Information: [phone]  Emotional Assessment Appearance:: Appears stated age Attitude/Demeanor/Rapport: Engaged, Gracious Affect (typically observed): Accepting, Adaptable, Appropriate, Pleasant Orientation: : Oriented to Self, Oriented to Place, Oriented to  Time, Oriented to Situation Alcohol / Substance Use: Not Applicable Psych Involvement: No (comment)  Admission diagnosis:  Acute pulmonary edema (HCC) [J81.0] Acute respiratory failure with hypoxia (HCC) [J96.01] HCAP (healthcare-associated pneumonia) [J18.9] Acute hypoxemic respiratory failure (HCC) [J96.01] Stage 5 chronic kidney disease on chronic dialysis ([place]) [N18.6, Z99.2] Patient Active Problem List   Diagnosis Date Noted  . Acute hypoxemic respiratory failure ([place]) 06/28/2019  . HCAP (healthcare-associated pneumonia)   . History of gout 06/17/2019  . 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 Pain aggravated by sitting 06/09/2019  . Pressure ulcer, shoulder blades, stage II ([place]), Left 06/09/2019  . Bowel incontinence 06/09/2019  . Protein-calorie malnutrition ([place]) 06/01/2019  . Weakness acquired in ICU   . Anoxic brain injury ([place])   . Physical deconditioning   . Sacral decubitus ulcer, stage IV ([place]) 05/10/2019  . History of acute respiratory distress syndrome (ARDS) due to COVID-19 virus   . History of cardiac arrest 04/27/2019  . History of non-ST elevation myocardial infarction (NSTEMI) 04/18/2019  . COVID-19 virus detected 04/14/2019  . Pulmonary hypertension ([place]) 04/14/2019  . Acute respiratory distress syndrome (ARDS) due to COVID-19  virus ([place]) 04/14/2019  . Acute respiratory failure with hypoxia ([place]) 04/14/2019  . Morbid obesity ([place]) 04/14/2019  . Diabetic neuropathy ([place]) 04/02/2016  . Hypertension associated with diabetes ([place]) 10/03/2015  . HLD (hyperlipidemia) 09/06/2015  . Stage 5 chronic kidney disease on chronic dialysis ([place]) 08/31/2015  . DM (diabetes mellitus), type 2 with renal complications ([place]) 06/19/7251  . OSA treated with BiPAP 12/12/2013  . Coronary atherosclerosis 11/01/2009   PCP:  [name], MD Pharmacy:  No Pharmacies Listed    Readmission Risk Interventions Readmission Risk Prevention Plan 06/29/2019 05/17/2019  Transportation Screening Complete Complete  Medication Review (RN Care Manager) Referral to Pharmacy Complete  PCP or Specialist appointment within 3-5 days of discharge Not Complete Complete  PCP/Specialist Appt Not Complete comments currently in a facility -  [place] or Home Care Consult Complete Complete  SW Recovery Care/Counseling Consult Complete Complete  Palliative Care Screening Not Applicable Not Applicable  Skilled Nursing Facility Complete Complete  Some recent data might be hidden

## 2019-06-29 NOTE — Evaluation (Signed)
Occupational Therapy Evaluation Patient Details Name: Troy Wells MRN: [id number] DOB: January 23, 1966 Today's Date: 06/29/2019    History of Present Illness 54 yo admitted from SNF with hypoxia. 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 Covid 19 11/3, CHF, ESRD, DM, HTN, CAD, HLD   Clinical Impression   Patient is a 54 year old male that lives alone in a 2 story home with full flight to upstairs full bathroom. Patient was modified independent with self care, uses a cane for ambulation and works as Administrator. Currently due to deficits listed below patient requires min A x2 to stand from elevated surface and to transfer into bedside chair. Due to tremulous movement in extremities patient benefited from hooking arms under patient's arms and holding gait belt vs using rolling walker to take steps to bedside chair. Patient required min A for seated grooming/hygiene for distal support/stabilization due to tremors and max/total A for LB ADLs. Continue to recommend acute OT services in order to maximize patient safety and independence with self care.     Follow Up Recommendations  SNF;Supervision/Assistance - 24 hour    Equipment Recommendations  Other (comment)(defer to next venue)       Precautions / Restrictions Precautions Precautions: Fall Precaution Comments: sacral decubitus Restrictions Weight Bearing Restrictions: No      Mobility Bed Mobility Overal bed mobility: Needs Assistance Bed Mobility: Supine to Sit     Supine to sit: Min assist;Mod assist;HOB elevated     General bed mobility comments: assist for trunk mobility due to tremulous  Transfers Overall transfer level: Needs assistance Equipment used: 2 person hand held assist Transfers: Sit to/from Stand Sit to Stand: Min assist;+2 physical assistance;From elevated surface         General transfer comment: min +2 to stand from  elevated bed x 3 trials with pt able to hook arms and have left knee blocked for safety to stand. Pt stood with RW at chair with decreased control in standing and increased ataxic movement with partial buckling    Balance Overall balance assessment: Needs assistance Sitting-balance support: Feet supported;No upper extremity supported Sitting balance-Leahy Scale: Fair Sitting balance - Comments: minguard for sitting EOB   Standing balance support: Bilateral upper extremity supported Standing balance-Leahy Scale: Poor Standing balance comment: bil UE support on therapist or RW with improved standing with bil UE hooking                           ADL either performed or assessed with clinical judgement   ADL Overall ADL's : Needs assistance/impaired Eating/Feeding: Minimal assistance;Sitting Eating/Feeding Details (indicate cue type and reason): min A to stabilize due to tremors Grooming: Oral care;Wash/dry face;Minimal assistance;Sitting Grooming Details (indicate cue type and reason): min A for support distally to minimize tremors while brushing teeth Upper Body Bathing: Minimal assistance;Moderate assistance;Sitting   Lower Body Bathing: Maximal assistance;Total assistance;Sitting/lateral leans   Upper Body Dressing : Minimal assistance;Moderate assistance;Sitting   Lower Body Dressing: Maximal assistance;Total assistance;Sitting/lateral leans Lower Body Dressing Details (indicate cue type and reason): patient attempts to reach socks to doff however due to tremors patient has increased difficulty maintaining balance and coordination Toilet Transfer: +2 for physical assistance;+2 for safety/equipment;Ambulation;BSC;Minimal assistance Toilet Transfer Details (indicate cue type and reason): simulated to chair with x2 assist with gait belt and hooking under patient's arms, increased time to take steps to maintain balance Toileting-  Clothing Manipulation and Hygiene: Total  assistance       Functional mobility during ADLs: Moderate assistance;+2 for physical assistance;+2 for safety/equipment;Cueing for sequencing;Cueing for safety General ADL Comments: due to increased weakness, decreased balance, coordination and tremors patient requires increased assistance for self care tasks                  Pertinent Vitals/Pain Pain Assessment: No/denies pain     Hand Dominance Right   Extremity/Trunk Assessment Upper Extremity Assessment Upper Extremity Assessment: Generalized weakness RUE Coordination: decreased fine motor;decreased gross motor LUE Coordination: decreased fine motor;decreased gross motor   Lower Extremity Assessment Lower Extremity Assessment: Defer to PT evaluation    Cervical / Trunk Assessment Cervical / Trunk Assessment: Normal   Communication Communication Communication: No difficulties   Cognition Arousal/Alertness: Awake/alert Behavior During Therapy: WFL for tasks assessed/performed Overall Cognitive Status: Within Functional Limits for tasks assessed                               General Comments: A/O x4   General Comments  VSS            Home Living Family/patient expects to be discharged to:: Skilled nursing facility Living Arrangements: Alone                               Additional Comments: patient lives in [facility] with half bath on first level, otherwise full flight to bedrooms and full bathrooms. Patient has walk in shower.       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 patient states he has not been home in 5 months        OT Problem List: Decreased strength;Decreased range of motion;Decreased activity tolerance;Impaired balance (sitting and/or standing);Decreased safety awareness;Decreased knowledge of use of DME or AE;Decreased knowledge of  precautions;Impaired UE functional use;Cardiopulmonary status limiting activity;Pain;Decreased coordination      OT Treatment/Interventions: Self-care/ADL training;Therapeutic exercise;Energy conservation;DME and/or AE instruction;Therapeutic activities;Patient/family education    OT Goals(Current goals can be found in the care plan section) Acute Rehab OT Goals Patient Stated Goal: to be able to walk OT Goal Formulation: With patient Time For Goal Achievement: 07/13/19 Potential to Achieve Goals: Good  OT Frequency: Min 2X/week           Co-evaluation PT/OT/SLP Co-Evaluation/Treatment: Yes Reason for Co-Treatment: Complexity of the patient's impairments (multi-system involvement);For patient/therapist safety;To address functional/ADL transfers PT goals addressed during session: Mobility/safety with mobility;Balance;Proper use of DME OT goals addressed during session: ADL's and self-care      AM-PAC OT "6 Clicks" Daily Activity     Outcome Measure Help from another person eating meals?: A Little Help from another person taking care of personal grooming?: A Little Help from another person toileting, which includes using toliet, bedpan, or urinal?: A Lot Help from another person bathing (including washing, rinsing, drying)?: A Lot Help from another person to put on and taking off regular upper body clothing?: A Little Help from another person to put on and taking off regular lower body clothing?: Total 6 Click Score: 14   End of Session Equipment Utilized During Treatment: Gait belt Nurse Communication: Mobility status  Activity Tolerance: Patient tolerated treatment well Patient left: in chair;with call bell/phone within reach;with chair alarm set  OT Visit Diagnosis: Unsteadiness on feet (R26.81);Other abnormalities of gait and mobility (R26.89);Muscle weakness (generalized) (M62.81)                Time: 4081-4481 OT Time Calculation (min): 38 min Charges:  OT General  Charges $OT Visit: 1 Visit OT Evaluation $OT Eval Moderate Complexity: 1 Mod OT Treatments $Self Care/Home Management : 8-22 mins  Shon Millet OT OT office: [place] 06/29/2019, 12:06 PM

## 2019-06-29 NOTE — Plan of Care (Signed)
  Problem: Education: Goal: Knowledge of General Education information will improve Description Including pain rating scale, medication(s)/side effects and non-pharmacologic comfort measures Outcome: Progressing   

## 2019-06-29 NOTE — Evaluation (Signed)
Physical Therapy Evaluation Patient Details Name: Troy Wells MRN: [id number] DOB: 1966-01-19 Today's Date: 06/29/2019   History of Present Illness  54 yo admitted from SNF with hypoxia. PMhx: recent D/C 12/30 after admissino for cardiac arrest with bi thalamic anoxic injury and chronic R PICA cerebellar infarct. Covid 19 11/3, CHF, ESRD, DM, HTN, CAD, HLD  Clinical Impression  Pt sitting EOB with OT on arrival. Pt reports having not walked for 4 months, being fearful of falling and wanting to walk and be able to move more. Pt with decreased muscular control with ataxic jerking with standing and scooting with improved stability with bil UE support. Pt with decreased transfers, gait, functional mobility and balance who will benefit from acute therapy to maximize independence and function to decrease burden of care.      Follow Up Recommendations SNF;Supervision/Assistance - 24 hour    Equipment Recommendations  3in1 (PT)    Recommendations for Other Services       Precautions / Restrictions Precautions Precautions: Fall Precaution Comments: sacral decubitus      Mobility  Bed Mobility               General bed mobility comments: Pt EOB on P.T. arrival with O.T. Pt able to scoot toward HOB in sitting with slight ataxic movement with engaging bil UE to scoot  Transfers Overall transfer level: Needs assistance   Transfers: Sit to/from Stand Sit to Stand: Min assist;+2 physical assistance;From elevated surface         General transfer comment: min +2 to stand from elevated bed x 3 trials with pt able to hook arms and have left knee blocked for safety to stand. Pt stood with RW at chair with decreased control in standing and increased ataxic movement with partial buckling  Ambulation/Gait Ambulation/Gait assistance: Min assist;+2 safety/equipment Gait Distance (Feet): 3 Feet Assistive device: 2 person hand held assist Gait Pattern/deviations: Step-to  pattern;Decreased stride length;Narrow base of support   Gait velocity interpretation: 1.31 - 2.62 ft/sec, indicative of limited community ambulator General Gait Details: pt with slow cautious steps. Pt able to side step toward [facility] and step forward and back at EOB with cues and increased time. Pt then able to perform steps to pivot 10ft bed to chair with bil UE hooked on therapist arms  Stairs            Wheelchair Mobility    Modified Rankin (Stroke Patients Only)       Balance Overall balance assessment: Needs assistance Sitting-balance support: Feet supported;No upper extremity supported Sitting balance-Leahy Scale: Fair Sitting balance - Comments: minguard for sitting EOB   Standing balance support: Bilateral upper extremity supported Standing balance-Leahy Scale: Poor Standing balance comment: bil UE support on therapist or RW with improved standing with bil UE hooking                             Pertinent Vitals/Pain Pain Assessment: No/denies pain    Home Living Family/patient expects to be discharged to:: Skilled nursing facility Living Arrangements: Alone                    Prior Function                 Hand Dominance        Extremity/Trunk Assessment   Upper Extremity Assessment Upper Extremity Assessment: Defer to OT evaluation    Lower Extremity Assessment RLE Deficits / Details:  pt with bil LE ataxic jerking in standing at times. Pt with improved standing and bil LE activation with bil UE hooked with therapists    Cervical / Trunk Assessment Cervical / Trunk Assessment: Normal  Communication      Cognition Arousal/Alertness: Awake/alert Behavior During Therapy: WFL for tasks assessed/performed                           Following Commands: Follows one step commands consistently Safety/Judgement: Decreased awareness of safety            General Comments      Exercises     Assessment/Plan    PT  Assessment Patient needs continued PT services  PT Problem List Decreased strength;Decreased range of motion;Decreased activity tolerance;Decreased balance;Decreased mobility;Decreased cognition;Decreased coordination;Decreased safety awareness;Pain;Decreased skin integrity;Decreased knowledge of use of DME       PT Treatment Interventions DME instruction;Gait training;Functional mobility training;Therapeutic activities;Therapeutic exercise;Balance training;Cognitive remediation;Patient/family education;Neuromuscular re-education    PT Goals (Current goals can be found in the Care Plan section)  Acute Rehab PT Goals Patient Stated Goal: to be able to walk PT Goal Formulation: With patient Time For Goal Achievement: 07/13/19 Potential to Achieve Goals: Good    Frequency Min 3X/week   Barriers to discharge Decreased caregiver support      Co-evaluation PT/OT/SLP Co-Evaluation/Treatment: Yes Reason for Co-Treatment: Complexity of the patient's impairments (multi-system involvement);For patient/therapist safety PT goals addressed during session: Mobility/safety with mobility;Balance;Proper use of DME         AM-PAC PT "6 Clicks" Mobility  Outcome Measure Help needed turning from your back to your side while in a flat bed without using bedrails?: A Little Help needed moving from lying on your back to sitting on the side of a flat bed without using bedrails?: A Little Help needed moving to and from a bed to a chair (including a wheelchair)?: A Little Help needed standing up from a chair using your arms (e.g., wheelchair or bedside chair)?: A Little Help needed to walk in hospital room?: A Lot Help needed climbing 3-5 steps with a railing? : Total 6 Click Score: 15    End of Session Equipment Utilized During Treatment: Gait belt Activity Tolerance: Patient tolerated treatment well Patient left: in chair;with call bell/phone within reach;with chair alarm set Nurse Communication:  Mobility status PT Visit Diagnosis: Other abnormalities of gait and mobility (R26.89);Muscle weakness (generalized) (M62.81);Difficulty in walking, not elsewhere classified (R26.2);Pain;Other symptoms and signs involving the nervous system (R29.898)    Time: 5974-1638 PT Time Calculation (min) (ACUTE ONLY): 16 min   Charges:   PT Evaluation $PT Eval Moderate Complexity: [place], PT Acute Rehabilitation Services Pager: [phone] Office: [phone]   Sandy Salaam Joel Cowin 06/29/2019, 10:41 AM

## 2019-06-29 NOTE — NC FL2 (Signed)
MEDICAID FL2 LEVEL OF CARE SCREENING TOOL     IDENTIFICATION  Patient Name: Troy Wells Birthdate: [birth year] Sex: male Admission Date (Current Location): 06/27/2019  [facility] and [place] Number:  Herbalist and Address:  [facility], [place] [address] [place], [place] [address]      Provider Number: [id number]  Attending Physician Name and Address:  Maryjane Hurter, MD  Relative Name and Phone Number:       Current Level of Care: Hospital Recommended Level of Care: [place] Prior Approval Number:    Date Approved/Denied:   PASRR Number: [id number]  Discharge Plan: SNF    Current Diagnoses: Patient Active Problem List   Diagnosis Date Noted  . Acute hypoxemic respiratory failure ([place]) 06/28/2019  . HCAP (healthcare-associated pneumonia)   . History of gout 06/17/2019  . 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 Pain aggravated by sitting 06/09/2019  . Pressure ulcer, shoulder blades, stage II ([place]), Left 06/09/2019  . Bowel incontinence 06/09/2019  . Protein-calorie malnutrition ([place]) 06/01/2019  . Weakness acquired in ICU   . Anoxic brain injury ([place])   . Physical deconditioning   . Sacral decubitus ulcer, stage IV ([place]) 05/10/2019  . History of acute respiratory distress syndrome (ARDS) due to COVID-19 virus   . History of cardiac arrest 04/27/2019  . History of non-ST elevation myocardial infarction (NSTEMI) 04/18/2019  . COVID-19 virus detected 04/14/2019  . Pulmonary hypertension ([place]) 04/14/2019  . Acute respiratory distress syndrome (ARDS) due to COVID-19 virus ([place]) 04/14/2019  . Acute respiratory failure with hypoxia ([place]) 04/14/2019  . Morbid obesity ([place]) 04/14/2019  . Diabetic neuropathy ([place]) 04/02/2016  . Hypertension associated with diabetes ([place]) 10/03/2015  . HLD (hyperlipidemia) 09/06/2015  . Stage 5 chronic kidney disease on chronic dialysis ([place]) 08/31/2015  . DM (diabetes mellitus),  type 2 with renal complications ([place]) 22/97/9892  . OSA treated with BiPAP 12/12/2013  . Coronary atherosclerosis 11/01/2009    Orientation RESPIRATION BLADDER Height & Weight     Time, Self, Situation, Place  Normal Continent, External catheter Weight: 210 lb 15.7 oz (95.7 kg)(bed scale) Height:  5\' 11"  (180.3 cm)  BEHAVIORAL SYMPTOMS/MOOD NEUROLOGICAL BOWEL NUTRITION STATUS      Continent Diet(see discharge summary)  AMBULATORY STATUS COMMUNICATION OF NEEDS Skin   Extensive Assist Verbally PU Stage and Appropriate Care, Other (Comment)(stage 3 PU on buttock; cracking on bilateral feet)     PU Stage 3 Dressing: BID(wet to dry dressing and gauze twice a day)                 Personal Care Assistance Level of Assistance  Bathing, Feeding, Dressing Bathing Assistance: Maximum assistance   Dressing Assistance: Maximum assistance     Functional Limitations Info  Sight, Hearing, Speech Sight Info: Adequate Hearing Info: Adequate Speech Info: Adequate    SPECIAL CARE FACTORS FREQUENCY  PT (By licensed PT), OT (By licensed OT)     PT Frequency: 5x week OT Frequency: 5x week            Contractures Contractures Info: Not present    Additional Factors Info  Code Status, Allergies, Insulin Sliding Scale Code Status Info: Full Code Allergies Info: No Known Allergies   Insulin Sliding Scale Info: insulin aspart (novoLOG) injection 0-6 Units every 4 hours;       Current Medications (06/29/2019):  This is the current hospital active medication list Current Facility-Administered Medications  Medication Dose Route Frequency Provider Last Rate  Last Admin  . 0.9 %  sodium chloride infusion  100 mL Intravenous PRN Pearson Grippe B, MD      . 0.9 %  sodium chloride infusion  100 mL Intravenous PRN Pearson Grippe B, MD      . acetaminophen (TYLENOL) tablet 650 mg  650 mg Oral Q4H PRN Frederik Pear, MD   650 mg at 06/29/19 0359  . allopurinol (ZYLOPRIM) tablet 100 mg  100 mg  Oral BID Maryjane Hurter, MD   100 mg at 06/29/19 0349  . alteplase (CATHFLO ACTIVASE) injection 2 mg  2 mg Intracatheter Once PRN Pearson Grippe B, MD      . amoxicillin-clavulanate (AUGMENTIN) 500-125 MG per tablet 500 mg  1 tablet Oral Q24 Hr x 5 Icard, Bradley L, DO      . aspirin chewable tablet 81 mg  81 mg Oral Daily Maryjane Hurter, MD   81 mg at 06/29/19 1791  . Chlorhexidine Gluconate Cloth 2 % PADS 6 each  6 each Topical Q0600 Rexene Agent, MD   6 each at 06/29/19 0404  . ferric citrate (AURYXIA) tablet 420 mg  420 mg Oral TID WC Valentina Gu, NP      . heparin injection 1,000 Units  1,000 Units Dialysis PRN Rexene Agent, MD      . heparin injection 5,000 Units  5,000 Units Subcutaneous BID Maryjane Hurter, MD   5,000 Units at 06/29/19 [phone]  . heparin injection 6,000 Units  6,000 Units Dialysis Once in dialysis Pearson Grippe B, MD      . insulin aspart (novoLOG) injection 0-6 Units  0-6 Units Subcutaneous Q4H Gleason, Otilio Carpen, PA-C      . ipratropium-albuterol (DUONEB) 0.5-2.5 (3) MG/3ML nebulizer solution 3 mL  3 mL Nebulization Q4H PRN Maryjane Hurter, MD      . lidocaine (PF) (XYLOCAINE) 1 % injection 5 mL  5 mL Intradermal PRN Pearson Grippe B, MD      . lidocaine-prilocaine (EMLA) cream 1 application  1 application Topical PRN Pearson Grippe B, MD      . MEDLINE mouth rinse  15 mL Mouth Rinse BID Gleason, Otilio Carpen, PA-C   15 mL at 06/28/19 1021  . pentafluoroprop-tetrafluoroeth (GEBAUERS) aerosol 1 application  1 application Topical PRN Pearson Grippe B, MD      . polyethylene glycol (MIRALAX / GLYCOLAX) packet 17 g  17 g Oral Daily PRN Maryjane Hurter, MD      . senna [facility]) tablet 8.6 mg  1 tablet Oral Daily PRN Maryjane Hurter, MD         Discharge Medications: Please see discharge summary for a list of discharge medications.  Relevant Imaging Results:  Relevant Lab Results:   Additional Information SSN: [id number]    COVID negative; MWF  HD at [place]

## 2019-06-29 NOTE — Progress Notes (Signed)
Pharmacy Antibiotic Note  Troy Wells is a 54 y.o. male admitted on 06/27/2019 with sepsis - has documented CNS bacteremia per BCx 1/10.  Pharmacy had been consulted for vancomycin and cefepime dosing then changed to augmentin earlier today > will restart Vancomycin tonight with positive blood cultures.   Pt ESRD last HD 1/1 with vancomycin given at the end of HD  Plan: Vancomycin 1000mg  IV every HD - MWF - next dose due 1/13 Goal pre-HD level 15-25 mcg/mL.   Height: 5\' 11"  (180.3 cm) Weight: 210 lb 15.7 oz (95.7 kg)(bed scale) IBW/kg (Calculated) : 75.3  Temp (24hrs), Avg:98.4 F (36.9 C), Min:97.8 F (36.6 C), Max:99.6 F (37.6 C)  Recent Labs  Lab 06/27/19 2224 06/28/19 0439 06/29/19 0210 06/29/19 1117  WBC 10.2 8.8 6.9  --   CREATININE 10.37* 10.50* 8.12* 9.22*  LATICACIDVEN 8.2* 2.2*  --   --     Estimated Creatinine Clearance: 10.8 mL/min (A) (by C-G formula based on SCr of 9.22 mg/dL (H)).    No Known Allergies   Microbiology 1/10 BCx 3/3 CNS    Bonnita Nasuti Pharm.D. CPP, BCPS Clinical Pharmacist [phone] 06/29/2019 6:36 PM

## 2019-06-29 NOTE — Progress Notes (Addendum)
[facility] Progress Note   Subjective: Seen on room, working with PT. Has "shimmy shakes" but says he is going to walk out of here. Very weak but denies SOB.    Objective Vitals:   06/28/19 1951 06/28/19 2233 06/29/19 0402 06/29/19 0500  BP: 138/77  137/79   Pulse: (!) 107 94 (!) 50   Resp: 20 18 19    Temp: 99.6 F (37.6 C)  97.8 F (36.6 C)   TempSrc: Oral  Oral   SpO2: 95% 96% 96%   Weight:    95.7 kg  Height:       Physical Exam General: Pleasant, WN,WD male in NAD Heart: S1,S2 RRR Lungs: CTAB at present. No WOB.  Abdomen: S,NT active BS Extremities: No Le edema.  Skin: Warm dry no cyanosis, has sacral wound with drsg intact. Dialysis Access: L AVF + bruit   Additional Objective Labs: Basic Metabolic Panel: Recent Labs  Lab 06/27/19 2224 06/28/19 0020 06/28/19 0439 06/29/19 0210  NA 140 138 139 140  K 3.9 3.8 4.2 3.9  CL 94*  --  94* 99  CO2 28  --  31 27  GLUCOSE 213*  --  120* 110*  BUN 28*  --  30* 26*  CREATININE 10.37*  --  10.50* 8.12*  CALCIUM 10.2  --  10.1 10.2  PHOS  --   --  5.2*  --    Liver Function Tests: Recent Labs  Lab 06/27/19 2224  AST 28  ALT 24  ALKPHOS 88  BILITOT 0.9  PROT 6.6  ALBUMIN 3.1*   No results for input(s): LIPASE, AMYLASE in the last 168 hours. CBC: Recent Labs  Lab 06/27/19 2224 06/28/19 0020 06/28/19 0439 06/29/19 0210  WBC 10.2  --  8.8 6.9  NEUTROABS 4.0  --   --   --   HGB 10.5* 10.2* 9.0* 9.9*  HCT 35.9* 30.0* 29.1* 31.8*  MCV 96.8  --  90.1 89.6  PLT 316  --  326 310   Blood Culture    Component Value Date/Time   SDES BLOOD RIGHT HAND 06/27/2019 2225   SPECREQUEST  06/27/2019 2225    BOTTLES DRAWN AEROBIC AND ANAEROBIC Blood Culture adequate volume   CULT GRAM POSITIVE COCCI 06/27/2019 2225   REPTSTATUS PENDING 06/27/2019 2225    Cardiac Enzymes: No results for input(s): CKTOTAL, CKMB, CKMBINDEX, TROPONINI in the last 168 hours. CBG: Recent Labs  Lab 06/28/19 1743  06/28/19 1948 06/28/19 2341 06/29/19 0358 06/29/19 0824  GLUCAP 123* 143* 157* 103* 110*   Iron Studies:  Recent Labs    06/27/19 2224  FERRITIN 2,719*   @lablastinr3 @ Studies/Results: CTA Chest for PE  Result Date: 06/28/2019 CLINICAL DATA:  Shortness of breath EXAM: CT ANGIOGRAPHY CHEST WITH CONTRAST TECHNIQUE: Multidetector CT imaging of the chest was performed using the standard protocol during bolus administration of intravenous contrast. Multiplanar CT image reconstructions and MIPs were obtained to evaluate the vascular anatomy. CONTRAST:  71mL OMNIPAQUE IOHEXOL 350 MG/ML SOLN COMPARISON:  None. FINDINGS: Cardiovascular: Contrast injection is sufficient to demonstrate satisfactory opacification of the pulmonary arteries to the segmental level. There is no pulmonary embolus or evidence of right heart strain. The size of the main pulmonary artery is normal. Cardiomegaly with coronary artery calcification. The course and caliber of the aorta are normal. There is mild atherosclerotic calcification. Opacification decreased due to pulmonary arterial phase contrast bolus timing. Mediastinum/Nodes: No mediastinal, hilar or axillary lymphadenopathy. The visualized thyroid and thoracic esophageal course are  unremarkable. Lungs/Pleura: There is diffuse pulmonary edema. There is a small area of consolidation in the lingula. No pleural effusion. The airways are patent. Upper Abdomen: Contrast bolus timing is not optimized for evaluation of the abdominal organs. The visualized portions of the organs of the upper abdomen are normal. Musculoskeletal: No chest wall abnormality. No bony spinal canal stenosis. Review of the MIP images confirms the above findings. IMPRESSION: 1. No pulmonary embolus or acute aortic syndrome. 2. Diffuse pulmonary edema. Small area of consolidation in the lingula, which may indicate pneumonia. 3. Cardiomegaly and coronary artery calcification. Aortic Atherosclerosis (ICD10-I70.0).  Electronically Signed   By: Ulyses Jarred M.D.   On: 06/28/2019 00:57   DG Chest Port 1 View  Result Date: 06/27/2019 CLINICAL DATA:  54 year old male COVID-19. Shortness of breath and hypoxia. EXAM: PORTABLE CHEST 1 VIEW COMPARISON:  Portable chest 05/19/2019 and earlier. FINDINGS: Portable AP upright view at 2237 hours. Cardiomegaly suspected. Other mediastinal contours are within normal limits. Visualized tracheal air column is within normal limits. Left greater than right mixed interstitial and airspace opacity with retrocardiac air bronchograms. The right upper lung is relatively spared. No superimposed pneumothorax or pleural effusion. No acute osseous abnormality identified. IMPRESSION: 1. Left greater than right mixed pulmonary interstitial and airspace opacity compatible with COVID-19 pneumonia in this setting. 2. Cardiomegaly suspected. Electronically Signed   By: Genevie Ann M.D.   On: 06/27/2019 22:49   ECHOCARDIOGRAM COMPLETE  Result Date: 06/28/2019   ECHOCARDIOGRAM REPORT   Patient Name:   Troy Wells Date of Exam: 06/28/2019 Medical Rec #:  [id number]        Height:       71.0 in Accession #:    [id number]       Weight:       222.2 lb Date of Birth:  June 09, 1966        BSA:          2.21 m Patient Age:    54 years         BP:           129/106 mmHg Patient Gender: M                HR:           92 bpm. Exam Location:  Inpatient Procedure: 2D Echo and Strain Analysis Indications:    Cardiomyopathy-Ischemic 414.8 / I25.5  History:        Patient has prior history of Echocardiogram examinations, most                 recent 04/27/2019. Previous Myocardial Infarction and CAD,                 Signs/Symptoms:Dyspnea; Risk Factors:Hypertension, Dyslipidemia                 and Sleep Apnea. History of COVID-19. End stage renal disease.                 Anemia.  Sonographer:    Darlina Sicilian RDCS Referring Phys: [id number] Ethan  1. Left ventricular ejection fraction, by visual  estimation, is 30 to 35%. The left ventricle has normal function. There is moderately increased left ventricular hypertrophy.  2. Basal inferolateral segment, basal anterolateral segment, and basal inferior segment are abnormal.  3. Left ventricular diastolic parameters are consistent with Grade I diastolic dysfunction (impaired relaxation).  4. Mildly dilated left ventricular internal cavity size.  5. The left ventricle demonstrates global  hypokinesis.  6. Global right ventricle has normal systolic function.The right ventricular size is normal. No increase in right ventricular wall thickness.  7. Left atrial size was moderately dilated.  8. Right atrial size was normal.  9. The mitral valve is normal in structure. Mild mitral valve regurgitation. No evidence of mitral stenosis. 10. The tricuspid valve is normal in structure. 11. The aortic valve is normal in structure. Aortic valve regurgitation is not visualized. No evidence of aortic valve sclerosis or stenosis. 12. The pulmonic valve was normal in structure. Pulmonic valve regurgitation is not visualized. 13. The inferior vena cava is normal in size with greater than 50% respiratory variability, suggesting right atrial pressure of 3 mmHg. 14. The average left ventricular global longitudinal strain is -9.1 %. In comparison to the previous echocardiogram(s): EF is reduced when compared to prior echocardiogram. FINDINGS  Left Ventricle: Left ventricular ejection fraction, by visual estimation, is 30 to 35%. The left ventricle has normal function. The average left ventricular global longitudinal strain is -9.1 %. The left ventricle demonstrates global hypokinesis. The left ventricular internal cavity size was mildly dilated left ventricle. There is moderately increased left ventricular hypertrophy. Left ventricular diastolic parameters are consistent with Grade I diastolic dysfunction (impaired relaxation). Normal left atrial pressure.  LV Wall Scoring: The basal  inferolateral segment, basal anterolateral segment, and basal inferior segment are akinetic. Right Ventricle: The right ventricular size is normal. No increase in right ventricular wall thickness. Global RV systolic function is has normal systolic function. Left Atrium: Left atrial size was moderately dilated. Right Atrium: Right atrial size was normal in size Pericardium: There is no evidence of pericardial effusion. Mitral Valve: The mitral valve is normal in structure. Mild mitral valve regurgitation. No evidence of mitral valve stenosis by observation. Tricuspid Valve: The tricuspid valve is normal in structure. Tricuspid valve regurgitation is not demonstrated. Aortic Valve: The aortic valve is normal in structure. Aortic valve regurgitation is not visualized. The aortic valve is structurally normal, with no evidence of sclerosis or stenosis. Pulmonic Valve: The pulmonic valve was normal in structure. Pulmonic valve regurgitation is not visualized. Pulmonic regurgitation is not visualized. Aorta: The aortic root, ascending aorta and aortic arch are all structurally normal, with no evidence of dilitation or obstruction. Venous: The inferior vena cava is normal in size with greater than 50% respiratory variability, suggesting right atrial pressure of 3 mmHg. IAS/Shunts: No atrial level shunt detected by color flow Doppler. There is no evidence of a patent foramen ovale. No ventricular septal defect is seen or detected. There is no evidence of an atrial septal defect.  LEFT VENTRICLE PLAX 2D LVIDd:         5.80 cm       Diastology LVIDs:         4.90 cm       LV e' lateral:   4.68 cm/s LV PW:         1.10 cm       LV E/e' lateral: 21.4 LV IVS:        1.60 cm       LV e' medial:    6.96 cm/s LVOT diam:     2.20 cm       LV E/e' medial:  14.4 LV SV:         54 ml LV SV Index:   23.64         2D Longitudinal Strain LVOT Area:     3.80 cm  2D Strain GLS Avg:     -9.1 %  LV Volumes (MOD) LV area d, A2C:    68.00  cm 3D Volume EF: LV area d, A4C:    61.80 cm 3D EF:        36 % LV area s, A2C:    44.30 cm LV EDV:       317 ml LV area s, A4C:    51.10 cm LV ESV:       204 ml LV major d, A2C:   11.90 cm  LV SV:        113 ml LV major d, A4C:   11.30 cm LV major s, A2C:   10.30 cm LV major s, A4C:   11.00 cm LV vol d, MOD A2C: 330.0 ml LV vol d, MOD A4C: 282.0 ml LV vol s, MOD A2C: 172.0 ml LV vol s, MOD A4C: 207.0 ml LV SV MOD A2C:     158.0 ml LV SV MOD A4C:     282.0 ml LV SV MOD BP:      117.2 ml RIGHT VENTRICLE RV S prime:     14.50 cm/s TAPSE (M-mode): 2.1 cm LEFT ATRIUM              Index       RIGHT ATRIUM           Index LA diam:        4.60 cm  2.09 cm/m  RA Area:     20.00 cm LA Vol (A2C):   100.0 ml 45.35 ml/m RA Volume:   56.70 ml  25.71 ml/m LA Vol (A4C):   96.1 ml  43.58 ml/m LA Biplane Vol: 99.1 ml  44.94 ml/m  AORTIC VALVE LVOT Vmax:   120.00 cm/s LVOT Vmean:  83.900 cm/s LVOT VTI:    0.201 m  AORTA Ao Root diam: 3.40 cm MITRAL VALVE MV Area (PHT): 4.80 cm              SHUNTS MV PHT:        45.82 msec            Systemic VTI:  0.20 m MV Decel Time: 158 msec              Systemic Diam: 2.20 cm MV E velocity: 100.00 cm/s 103 cm/s MV A velocity: 116.00 cm/s 70.3 cm/s MV E/A ratio:  0.86        1.5  Candee Furbish MD Electronically signed by Candee Furbish MD Signature Date/Time: 06/28/2019/12:55:04 PM    Final    Medications: . sodium chloride    . sodium chloride    . ceFEPime (MAXIPIME) IV 2 g (06/28/19 2100)  . vancomycin 1,000 mg (06/28/19 0909)   . allopurinol  100 mg Oral BID  . aspirin  81 mg Oral Daily  . Chlorhexidine Gluconate Cloth  6 each Topical Q0600  . heparin  5,000 Units Subcutaneous BID  . heparin  6,000 Units Dialysis Once in dialysis  . insulin aspart  0-6 Units Subcutaneous Q4H  . mouth rinse  15 mL Mouth Rinse BID   HD orders: [facility] MWF 4.5 hrs 200NRe 450/800 99kg 2.0K/2.0 Ca UFP 4 AVF -Heparin 6000 units IV initial bolus Heparin 2500 units IV mid run TIW -Mircera 200 mcg  IV q 2 weeks (last dose 06/19/2019)  Background: 54 Y/O male S/P COVID 19 presented with pulmonary edema, volume overload, HCAP.   Assessment/Plan: 1. Pulmonary Edema: No missed treatments, was  getting to OP EDW. Resolved with HD. Needs lower wt on discharge.  2. HCAP-ABX per primary 3. ESRD -MWF-next HD tomorrow. Truncated treatment time D/T high pt census. Modified heparin dose.  4. Anemia -HGB 9.9 recent ESA dose as OP. Follow HGB.  5. Secondary hyperparathyroidism - Ca 10.1 C Ca 10.8 Phos OK. Use 2.0 Ca bath, continue Auryxia binders. No VDRA.  6. HTN/volume - HD 01/11 Net UF 5.5 liters. Post wt 95.3. Continue lowered volume as tolerated.  BP controlled 7. Nutrition -Renal/Carb mod diet, nepro, renal vit 8. DM-per primary 9. Sacral Decub-per primary  Jalise Zawistowski H. Jabori Henegar NP-C 06/29/2019, 9:27 AM  [employer] [phone]

## 2019-06-29 NOTE — Progress Notes (Signed)
PHARMACY - PHYSICIAN COMMUNICATION CRITICAL VALUE ALERT - BLOOD CULTURE IDENTIFICATION (BCID)  Troy Wells is an 54 y.o. male who presented to [facility] on 06/27/2019 with a chief complaint of SOB.  Assessment:  Started on broad-spectrum ABX for suspected PNA, now 2/4 bottles (one full set) growing GPC, awaiting ID; currently no leukocytosis.  Name of physician (or Provider) Contacted: OOgan  Current antibiotics: vancomycin and cefepime  Changes to prescribed antibiotics recommended:  Will continue current treatment for now.   Wynona Neat, PharmD, BCPS  06/29/2019  6:03 AM

## 2019-06-29 NOTE — Progress Notes (Signed)
RT placed patient on CPAP. Patient tolerating well at this time. No respiratory distress noted. RT will monitor as needed. 

## 2019-06-29 NOTE — Progress Notes (Signed)
Protocol assessment done at this time. CPT order changed to Flutter. Patient is able to do this effectively, and it is something that he can take back to SNF with him. No need for bronchodilator treatments at this time, but PRN order in place. Stated he wore his CPAP without issues last night. Patient appears to be doing better. RT to monitor.

## 2019-06-30 ENCOUNTER — Inpatient Hospital Stay (HOSPITAL_COMMUNITY): Payer: Medicare Other

## 2019-06-30 DIAGNOSIS — G253 Myoclonus: Secondary | ICD-10-CM

## 2019-06-30 DIAGNOSIS — L89154 Pressure ulcer of sacral region, stage 4: Secondary | ICD-10-CM

## 2019-06-30 DIAGNOSIS — I340 Nonrheumatic mitral (valve) insufficiency: Secondary | ICD-10-CM

## 2019-06-30 DIAGNOSIS — Z8616 Personal history of COVID-19: Secondary | ICD-10-CM

## 2019-06-30 DIAGNOSIS — Z87891 Personal history of nicotine dependence: Secondary | ICD-10-CM

## 2019-06-30 DIAGNOSIS — R7881 Bacteremia: Secondary | ICD-10-CM

## 2019-06-30 DIAGNOSIS — R011 Cardiac murmur, unspecified: Secondary | ICD-10-CM

## 2019-06-30 DIAGNOSIS — B957 Other staphylococcus as the cause of diseases classified elsewhere: Secondary | ICD-10-CM

## 2019-06-30 HISTORY — DX: Other staphylococcus as the cause of diseases classified elsewhere: B95.7

## 2019-06-30 HISTORY — DX: Bacteremia: R78.81

## 2019-06-30 LAB — CBC
HCT: 30.5 % — ABNORMAL LOW (ref 39.0–52.0)
Hemoglobin: 9.9 g/dL — ABNORMAL LOW (ref 13.0–17.0)
MCH: 28.6 pg (ref 26.0–34.0)
MCHC: 32.5 g/dL (ref 30.0–36.0)
MCV: 88.2 fL (ref 80.0–100.0)
Platelets: 317 10*3/uL (ref 150–400)
RBC: 3.46 MIL/uL — ABNORMAL LOW (ref 4.22–5.81)
RDW: 17.8 % — ABNORMAL HIGH (ref 11.5–15.5)
WBC: 7 10*3/uL (ref 4.0–10.5)
nRBC: 0 % (ref 0.0–0.2)

## 2019-06-30 LAB — HEPATIC FUNCTION PANEL
ALT: 21 U/L (ref 0–44)
AST: 20 U/L (ref 15–41)
Albumin: 3 g/dL — ABNORMAL LOW (ref 3.5–5.0)
Alkaline Phosphatase: 77 U/L (ref 38–126)
Bilirubin, Direct: <0.1 mg/dL (ref 0.0–0.2)
Total Bilirubin: 0.9 mg/dL (ref 0.3–1.2)
Total Protein: 6.5 g/dL (ref 6.5–8.1)

## 2019-06-30 LAB — RENAL FUNCTION PANEL
Albumin: 2.9 g/dL — ABNORMAL LOW (ref 3.5–5.0)
Anion gap: 16 — ABNORMAL HIGH (ref 5–15)
BUN: 39 mg/dL — ABNORMAL HIGH (ref 6–20)
CO2: 26 mmol/L (ref 22–32)
Calcium: 10.5 mg/dL — ABNORMAL HIGH (ref 8.9–10.3)
Chloride: 96 mmol/L — ABNORMAL LOW (ref 98–111)
Creatinine, Ser: 11.38 mg/dL — ABNORMAL HIGH (ref 0.61–1.24)
GFR calc Af Amer: 5 mL/min — ABNORMAL LOW (ref >60)
GFR calc non Af Amer: 4 mL/min — ABNORMAL LOW (ref >60)
Glucose, Bld: 104 mg/dL — ABNORMAL HIGH (ref 70–99)
Phosphorus: 4.6 mg/dL (ref 2.5–4.6)
Potassium: 3.8 mmol/L (ref 3.5–5.1)
Sodium: 138 mmol/L (ref 135–145)

## 2019-06-30 LAB — ECHOCARDIOGRAM LIMITED
Height: 71 in
Weight: 3499.14 oz

## 2019-06-30 LAB — AMMONIA: Ammonia: 19 umol/L (ref 9–35)

## 2019-06-30 LAB — GLUCOSE, CAPILLARY
Glucose-Capillary: 101 mg/dL — ABNORMAL HIGH (ref 70–99)
Glucose-Capillary: 102 mg/dL — ABNORMAL HIGH (ref 70–99)
Glucose-Capillary: 105 mg/dL — ABNORMAL HIGH (ref 70–99)
Glucose-Capillary: 130 mg/dL — ABNORMAL HIGH (ref 70–99)
Glucose-Capillary: 89 mg/dL (ref 70–99)

## 2019-06-30 LAB — SARS CORONAVIRUS 2 (TAT 6-24 HRS): SARS Coronavirus 2: NEGATIVE

## 2019-06-30 MED ORDER — VANCOMYCIN HCL IN DEXTROSE 1-5 GM/200ML-% IV SOLN
INTRAVENOUS | Status: AC
  Administered 2019-06-30: 1000 mg via INTRAVENOUS
  Filled 2019-06-30: qty 200

## 2019-06-30 MED ORDER — CARVEDILOL 6.25 MG PO TABS
6.2500 mg | ORAL_TABLET | Freq: Two times a day (BID) | ORAL | Status: DC
  Administered 2019-06-30 – 2019-07-01 (×3): 6.25 mg via ORAL
  Filled 2019-06-30 (×3): qty 1

## 2019-06-30 MED ORDER — SODIUM CHLORIDE 0.9 % IV SOLN: 100.0000 mL | INTRAVENOUS | Status: DC | PRN

## 2019-06-30 MED ORDER — HEPARIN SODIUM (PORCINE) 1000 UNIT/ML DIALYSIS: 3000.0000 [IU] | Freq: Once | INTRAMUSCULAR | Status: DC

## 2019-06-30 MED ORDER — HEPARIN SODIUM (PORCINE) 1000 UNIT/ML DIALYSIS: 12000.0000 [IU] | Freq: Once | INTRAMUSCULAR | Status: DC

## 2019-06-30 MED ORDER — ATORVASTATIN CALCIUM 80 MG PO TABS
80.0000 mg | ORAL_TABLET | Freq: Every day | ORAL | Status: DC
  Administered 2019-06-30 – 2019-07-01 (×2): 80 mg via ORAL
  Filled 2019-06-30 (×2): qty 1

## 2019-06-30 MED ORDER — HEPARIN SODIUM (PORCINE) 1000 UNIT/ML IJ SOLN
INTRAMUSCULAR | Status: AC
  Filled 2019-06-30: qty 4

## 2019-06-30 NOTE — Progress Notes (Addendum)
PROGRESS NOTE    Troy Wells  OEV:[id number] DOB: 1966/03/03 DOA: 06/27/2019 PCP: [name], MD (Confirm with patient/family/NH records and if not entered, this HAS to be entered at [facility] point of entry. "No PCP" if truly none.)   Brief Narrative:  Per H and P Dr. Valeta Harms: Troy Wells is a 54 year old male past medical history of CAD, ESRD, HFrEF, hypertension, diabetes, recent prolonged hospitalization 10/28-12/30/20 for Covid-19 pneumonia and ARDS lives at home alone despite being unable to walk.  He he noted nonproductive cough and denied any recent chest pain, lower extremity edema, fevers, nausea vomiting, or abdominal pain.  He goes to dialysis M/W/F and has not missed any sessions.  He is a prior smoker and wears CPAP at night.  He was initially very hypoxic on EMS arrival, improved in the ED with oxygen supplementation and BiPAP.  Vitals significant for hypothermia and tachycardia with labs significant for lactic acid of 8.2, BNP greater than 4500, respiratory viral panel negative, VBG pH 7.6, PCO2 35.  CT chest was negative for PE, notable for diffuse pulmonary edema with consolidation in the lingula.  He was treated with vancomycin and cefepime and PCCM consulted for admission.  At the time of exam patient was awake and alert and tolerating BiPAP well with improved shortness of breath.  Interval events: Out of ICU and tolerating room air with worsening myoclonus. Being treated with vancomycin and cefepime for possible pneumonia on admission. Blood culture 3/4 coag negative staph, with tunneled catheter, discussed with nephrology  Assessment & Plan:   Active Problems:   Coronary atherosclerosis   OSA treated with BiPAP   Stage 5 chronic kidney disease on chronic dialysis (HCC)   DM (diabetes mellitus), type 2 with renal complications (HCC)   HLD (hyperlipidemia)   Hypertension associated with diabetes ([place])   Diabetic neuropathy ([place])   History of cardiac arrest   Sacral  decubitus ulcer, stage IV (HCC)   Myoclonic jerking   Acute hypoxemic respiratory failure (HCC)   Coagulase negative Staphylococcus bacteremia  Acute respiratory failure with hypoxia: -Breathing improving -HD as scheduled M/W/F -Vanc/Cefepime to cover for pneumonia on admission -Encouraged activity, PT/OT evaluation -Chest physiotherapy as needed  Bacteremia coag negative Staph: -ID to see today, recommended 10 more days of vancomycin with HD -On appropriate antibiotics -Talked with nephrology and they are aware -Length of treatment per ID -On Vanc/Cepepime currently  Myoclonus:  -worsening per nephrology who has followed him since serious illness -consult neurology to assess  -could be related to his bacteremia potentially although he is now on appropriate treatment for 2 days without improvement -checking ammonia level and LFTs which are unremarkable  ESRD on HD: -nephro following -M/W/F dialysis -tunneled catheter will likely need to be replaced given bacteremia, nephro aware  DM type 2 with renal complications: -SSI   CAD: -ASA 81 mg daily -no chest pains  Hyperlipidemia: -reordered atorvastatin 80 mg daily  Hypertension: -BP elevated overnight and borderline high this morning -restart home coreg 6.25 mg BID  Sacral decubitus ulcer: -wound care consult -Noted on admission  DVT prophylaxis: Heparin Code Status: Full Family Communication: patient only Disposition Plan: depending on plan for bacteremia  Consultants:   Nephrology  ID  Neurology  Procedures:   HD  Antimicrobials:   Vancomycin: start date 06/27/19  Cefepime: start date 06/27/19  Subjective: Feeling better with breathing but not quite back to normal. Denies chest pains or tightness. Denies diarrhea, constipation. Denies abdominal pain or nausea or vomiting. No  fevers since admission.   Objective: Vitals:   06/29/19 2235 06/29/19 2306 06/30/19 0421 06/30/19 0500  BP: (!) 144/90   (!) 140/91   Pulse: 90 85 80   Resp:  16 19   Temp:   98.6 F (37 C)   TempSrc:   Oral   SpO2:  95% 100%   Weight:    99.2 kg  Height:        Intake/Output Summary (Last 24 hours) at 06/30/2019 0957 Last data filed at 06/29/2019 1700 Gross per 24 hour  Intake 440 ml  Output --  Net 440 ml   Filed Weights   06/28/19 1009 06/29/19 0500 06/30/19 0500  Weight: 95.3 kg 95.7 kg 99.2 kg    Examination:  General exam: Appears calm and comfortable  Respiratory system: Clear to auscultation. Respiratory effort normal, no accessory muscle usage. Cardiovascular system: S1 & S2 heard, RRR. No JVD, murmurs, rubs, gallops or clicks. No pedal edema. Gastrointestinal system: Abdomen is nondistended, soft and nontender. No organomegaly or masses felt. Normal bowel sounds heard. Central nervous system: Alert and oriented. myoclonal jerking upper and lower extremities Extremities: Symmetric 5 x 5 power. Skin: No rashes, lesions or ulcers Psychiatry: Judgement and insight appear normal. Mood & affect appropriate.   Data Reviewed: I have personally reviewed following labs and imaging studies  CBC: Recent Labs  Lab 06/27/19 2224 06/28/19 0020 06/28/19 0439 06/29/19 0210  WBC 10.2  --  8.8 6.9  NEUTROABS 4.0  --   --   --   HGB 10.5* 10.2* 9.0* 9.9*  HCT 35.9* 30.0* 29.1* 31.8*  MCV 96.8  --  90.1 89.6  PLT 316  --  326 741   Basic Metabolic Panel: Recent Labs  Lab 06/27/19 2224 06/28/19 0020 06/28/19 0439 06/29/19 0210 06/29/19 1117  NA 140 138 139 140 140  K 3.9 3.8 4.2 3.9 4.1  CL 94*  --  94* 99 98  CO2 28  --  31 27 28   GLUCOSE 213*  --  120* 110* 137*  BUN 28*  --  30* 26* 29*  CREATININE 10.37*  --  10.50* 8.12* 9.22*  CALCIUM 10.2  --  10.1 10.2 10.7*  MG  --   --  2.0  --   --   PHOS  --   --  5.2*  --  4.5   GFR: Estimated Creatinine Clearance: 11 mL/min (A) (by C-G formula based on SCr of 9.22 mg/dL (H)). Liver Function Tests: Recent Labs  Lab 06/27/19 2224  06/29/19 1117  AST 28  --   ALT 24  --   ALKPHOS 88  --   BILITOT 0.9  --   PROT 6.6  --   ALBUMIN 3.1* 2.9*   No results for input(s): LIPASE, AMYLASE in the last 168 hours. No results for input(s): AMMONIA in the last 168 hours. Coagulation Profile: No results for input(s): INR, PROTIME in the last 168 hours. Cardiac Enzymes: No results for input(s): CKTOTAL, CKMB, CKMBINDEX, TROPONINI in the last 168 hours. BNP (last 3 results) No results for input(s): PROBNP in the last 8760 hours. HbA1C: Recent Labs    06/28/19 0439  HGBA1C 5.1   CBG: Recent Labs  Lab 06/29/19 1721 06/29/19 2017 06/30/19 0010 06/30/19 0423 06/30/19 0755  GLUCAP 101* 102* 102* 101* 105*   Lipid Profile: Recent Labs    06/27/19 2224  TRIG 100   Thyroid Function Tests: No results for input(s): TSH, T4TOTAL, FREET4, T3FREE, THYROIDAB in  the last 72 hours. Anemia Panel: Recent Labs    06/27/19 2224  FERRITIN 2,719*   Sepsis Labs: Recent Labs  Lab 06/27/19 2224 06/28/19 0439  PROCALCITON 0.17  --   LATICACIDVEN 8.2* 2.2*    Recent Results (from the past 240 hour(s))  Blood Culture (routine x 2)     Status: None (Preliminary result)   Collection Time: 06/27/19 10:24 PM   Specimen: BLOOD RIGHT HAND  Result Value Ref Range Status   Specimen Description BLOOD RIGHT HAND  Final   Special Requests AEROBIC BOTTLE ONLY Blood Culture adequate volume  Final   Culture  Setup Time   Final    GRAM POSITIVE COCCI AEROBIC BOTTLE ONLY CRITICAL VALUE NOTED.  VALUE IS CONSISTENT WITH PREVIOUSLY REPORTED AND CALLED VALUE. Performed at [facility], [place] [address], [place], [place] [address]    Culture GRAM POSITIVE COCCI  Final   Report Status PENDING  Incomplete  Blood Culture ID Panel (Reflexed)     Status: Abnormal   Collection Time: 06/27/19 10:24 PM  Result Value Ref Range Status   Enterococcus species NOT DETECTED NOT DETECTED Final   Listeria monocytogenes NOT DETECTED NOT DETECTED  Final   Staphylococcus species DETECTED (A) NOT DETECTED Final    Comment: Methicillin (oxacillin) resistant coagulase negative staphylococcus. Possible blood culture contaminant (unless isolated from more than one blood culture draw or clinical case suggests pathogenicity). No antibiotic treatment is indicated for blood  culture contaminants. CRITICAL RESULT CALLED TO, READ BACK BY AND VERIFIED WITH: Bronwen Betters PharmD 16:30 06/29/19 ([email or web address])    Staphylococcus aureus (BCID) NOT DETECTED NOT DETECTED Final   Methicillin resistance DETECTED (A) NOT DETECTED Final    Comment: CRITICAL RESULT CALLED TO, READ BACK BY AND VERIFIED WITH: Bronwen Betters PharmD 16:30 06/29/19 ([email or web address])    Streptococcus species NOT DETECTED NOT DETECTED Final   Streptococcus agalactiae NOT DETECTED NOT DETECTED Final   Streptococcus pneumoniae NOT DETECTED NOT DETECTED Final   Streptococcus pyogenes NOT DETECTED NOT DETECTED Final   Acinetobacter baumannii NOT DETECTED NOT DETECTED Final   Enterobacteriaceae species NOT DETECTED NOT DETECTED Final   Enterobacter cloacae complex NOT DETECTED NOT DETECTED Final   Escherichia coli NOT DETECTED NOT DETECTED Final   Klebsiella oxytoca NOT DETECTED NOT DETECTED Final   Klebsiella pneumoniae NOT DETECTED NOT DETECTED Final   Proteus species NOT DETECTED NOT DETECTED Final   Serratia marcescens NOT DETECTED NOT DETECTED Final   Haemophilus influenzae NOT DETECTED NOT DETECTED Final   Neisseria meningitidis NOT DETECTED NOT DETECTED Final   Pseudomonas aeruginosa NOT DETECTED NOT DETECTED Final   Candida albicans NOT DETECTED NOT DETECTED Final   Candida glabrata NOT DETECTED NOT DETECTED Final   Candida krusei NOT DETECTED NOT DETECTED Final   Candida parapsilosis NOT DETECTED NOT DETECTED Final   Candida tropicalis NOT DETECTED NOT DETECTED Final    Comment: Performed at [facility], [place]. [address] [place], [place] [address]  Blood Culture (routine x 2)     Status:  Abnormal (Preliminary result)   Collection Time: 06/27/19 10:25 PM   Specimen: BLOOD RIGHT HAND  Result Value Ref Range Status   Specimen Description BLOOD RIGHT HAND  Final   Special Requests   Final    BOTTLES DRAWN AEROBIC AND ANAEROBIC Blood Culture adequate volume   Culture  Setup Time   Final    GRAM POSITIVE COCCI IN BOTH AEROBIC AND ANAEROBIC BOTTLES CRITICAL RESULT CALLED TO, READ BACK BY AND  VERIFIED WITH: PHARMD V BRYK [id number] AT 553 AM BY CM    Culture (A)  Final    STAPHYLOCOCCUS SPECIES (COAGULASE NEGATIVE) THE SIGNIFICANCE OF ISOLATING THIS ORGANISM FROM A SINGLE SET OF BLOOD CULTURES WHEN MULTIPLE SETS ARE DRAWN IS UNCERTAIN. PLEASE NOTIFY THE MICROBIOLOGY DEPARTMENT WITHIN ONE WEEK IF SPECIATION AND SENSITIVITIES ARE REQUIRED. Performed at [facility], [place] [address] [place], [place] [address]    Report Status PENDING  Incomplete  Respiratory Panel by RT PCR (Flu A&B, Covid) - Nasopharyngeal Swab     Status: None   Collection Time: 06/27/19 11:52 PM   Specimen: Nasopharyngeal Swab  Result Value Ref Range Status   SARS Coronavirus 2 by RT PCR NEGATIVE NEGATIVE Final    Comment: (NOTE) SARS-CoV-2 target nucleic acids are NOT DETECTED. The SARS-CoV-2 RNA is generally detectable in upper respiratoy specimens during the acute phase of infection. The lowest concentration of SARS-CoV-2 viral copies this assay can detect is 131 copies/mL. A negative result does not preclude SARS-Cov-2 infection and should not be used as the sole basis for treatment or other patient management decisions. A negative result may occur with  improper specimen collection/handling, submission of specimen other than nasopharyngeal swab, presence of viral mutation(s) within the areas targeted by this assay, and inadequate number of viral copies (<131 copies/mL). A negative result must be combined with clinical observations, patient history, and epidemiological information. The expected  result is Negative. Fact Sheet for Patients:  PinkCheek.be Fact Sheet for Healthcare Providers:  GravelBags.it This test is not yet ap proved or cleared by the [place] FDA and  has been authorized for detection and/or diagnosis of SARS-CoV-2 by FDA under an Emergency Use Authorization (EUA). This EUA will remain  in effect (meaning this test can be used) for the duration of the COVID-19 declaration under Section 564(b)(1) of the Act, 21 U.S.C. section 360bbb-3(b)(1), unless the authorization is terminated or revoked sooner.    Influenza A by PCR NEGATIVE NEGATIVE Final   Influenza B by PCR NEGATIVE NEGATIVE Final    Comment: (NOTE) The Xpert Xpress SARS-CoV-2/FLU/RSV assay is intended as an aid in  the diagnosis of influenza from Nasopharyngeal swab specimens and  should not be used as a sole basis for treatment. Nasal washings and  aspirates are unacceptable for Xpert Xpress SARS-CoV-2/FLU/RSV  testing. Fact Sheet for Patients: PinkCheek.be Fact Sheet for Healthcare Providers: GravelBags.it This test is not yet approved or cleared by the [place] FDA and  has been authorized for detection and/or diagnosis of SARS-CoV-2 by  FDA under an Emergency Use Authorization (EUA). This EUA will remain  in effect (meaning this test can be used) for the duration of the  Covid-19 declaration under Section 564(b)(1) of the Act, 21  U.S.C. section 360bbb-3(b)(1), unless the authorization is  terminated or revoked. Performed at [facility], [place] [address], [place], [place] [address]   MRSA PCR Screening     Status: None   Collection Time: 06/28/19  4:57 AM   Specimen: Nasal Mucosa; Nasopharyngeal  Result Value Ref Range Status   MRSA by PCR NEGATIVE NEGATIVE Final    Comment:        The GeneXpert MRSA Assay (FDA approved for NASAL specimens only), is one  component of a comprehensive MRSA colonization surveillance program. It is not intended to diagnose MRSA infection nor to guide or monitor treatment for MRSA infections. Performed at [facility], [place] [address], [place],  [address]  Radiology Studies: ECHOCARDIOGRAM COMPLETE  Result Date: 06/28/2019   ECHOCARDIOGRAM REPORT   Patient Name:   ARRICK DUTTON Date of Exam: 06/28/2019 Medical Rec #:  [id number]        Height:       71.0 in Accession #:    [id number]       Weight:       222.2 lb Date of Birth:  [birth year]        BSA:          2.21 m Patient Age:    62 years         BP:           129/106 mmHg Patient Gender: M                HR:           92 bpm. Exam Location:  Inpatient Procedure: 2D Echo and Strain Analysis Indications:    Cardiomyopathy-Ischemic 414.8 / I25.5  History:        Patient has prior history of Echocardiogram examinations, most                 recent 04/27/2019. Previous Myocardial Infarction and CAD,                 Signs/Symptoms:Dyspnea; Risk Factors:Hypertension, Dyslipidemia                 and Sleep Apnea. History of COVID-19. End stage renal disease.                 Anemia.  Sonographer:    Darlina Sicilian RDCS Referring Phys: [id number] [place]  1. Left ventricular ejection fraction, by visual estimation, is 30 to 35%. The left ventricle has normal function. There is moderately increased left ventricular hypertrophy.  2. Basal inferolateral segment, basal anterolateral segment, and basal inferior segment are abnormal.  3. Left ventricular diastolic parameters are consistent with Grade I diastolic dysfunction (impaired relaxation).  4. Mildly dilated left ventricular internal cavity size.  5. The left ventricle demonstrates global hypokinesis.  6. Global right ventricle has normal systolic function.The right ventricular size is normal. No increase in right ventricular wall thickness.  7. Left atrial size was moderately dilated.   8. Right atrial size was normal.  9. The mitral valve is normal in structure. Mild mitral valve regurgitation. No evidence of mitral stenosis. 10. The tricuspid valve is normal in structure. 11. The aortic valve is normal in structure. Aortic valve regurgitation is not visualized. No evidence of aortic valve sclerosis or stenosis. 12. The pulmonic valve was normal in structure. Pulmonic valve regurgitation is not visualized. 13. The inferior vena cava is normal in size with greater than 50% respiratory variability, suggesting right atrial pressure of 3 mmHg. 14. The average left ventricular global longitudinal strain is -9.1 %. In comparison to the previous echocardiogram(s): EF is reduced when compared to prior echocardiogram. FINDINGS  Left Ventricle: Left ventricular ejection fraction, by visual estimation, is 30 to 35%. The left ventricle has normal function. The average left ventricular global longitudinal strain is -9.1 %. The left ventricle demonstrates global hypokinesis. The left ventricular internal cavity size was mildly dilated left ventricle. There is moderately increased left ventricular hypertrophy. Left ventricular diastolic parameters are consistent with Grade I diastolic dysfunction (impaired relaxation). Normal left atrial pressure.  LV Wall Scoring: The basal inferolateral segment, basal anterolateral segment, and basal inferior segment are akinetic. Right Ventricle: The right ventricular size is normal.  No increase in right ventricular wall thickness. Global RV systolic function is has normal systolic function. Left Atrium: Left atrial size was moderately dilated. Right Atrium: Right atrial size was normal in size Pericardium: There is no evidence of pericardial effusion. Mitral Valve: The mitral valve is normal in structure. Mild mitral valve regurgitation. No evidence of mitral valve stenosis by observation. Tricuspid Valve: The tricuspid valve is normal in structure. Tricuspid valve  regurgitation is not demonstrated. Aortic Valve: The aortic valve is normal in structure. Aortic valve regurgitation is not visualized. The aortic valve is structurally normal, with no evidence of sclerosis or stenosis. Pulmonic Valve: The pulmonic valve was normal in structure. Pulmonic valve regurgitation is not visualized. Pulmonic regurgitation is not visualized. Aorta: The aortic root, ascending aorta and aortic arch are all structurally normal, with no evidence of dilitation or obstruction. Venous: The inferior vena cava is normal in size with greater than 50% respiratory variability, suggesting right atrial pressure of 3 mmHg. IAS/Shunts: No atrial level shunt detected by color flow Doppler. There is no evidence of a patent foramen ovale. No ventricular septal defect is seen or detected. There is no evidence of an atrial septal defect.  LEFT VENTRICLE PLAX 2D LVIDd:         5.80 cm       Diastology LVIDs:         4.90 cm       LV e' lateral:   4.68 cm/s LV PW:         1.10 cm       LV E/e' lateral: 21.4 LV IVS:        1.60 cm       LV e' medial:    6.96 cm/s LVOT diam:     2.20 cm       LV E/e' medial:  14.4 LV SV:         54 ml LV SV Index:   23.64         2D Longitudinal Strain LVOT Area:     3.80 cm      2D Strain GLS Avg:     -9.1 %  LV Volumes (MOD) LV area d, A2C:    68.00 cm 3D Volume EF: LV area d, A4C:    61.80 cm 3D EF:        36 % LV area s, A2C:    44.30 cm LV EDV:       317 ml LV area s, A4C:    51.10 cm LV ESV:       204 ml LV major d, A2C:   11.90 cm  LV SV:        113 ml LV major d, A4C:   11.30 cm LV major s, A2C:   10.30 cm LV major s, A4C:   11.00 cm LV vol d, MOD A2C: 330.0 ml LV vol d, MOD A4C: 282.0 ml LV vol s, MOD A2C: 172.0 ml LV vol s, MOD A4C: 207.0 ml LV SV MOD A2C:     158.0 ml LV SV MOD A4C:     282.0 ml LV SV MOD BP:      117.2 ml RIGHT VENTRICLE RV S prime:     14.50 cm/s TAPSE (M-mode): 2.1 cm LEFT ATRIUM              Index       RIGHT ATRIUM           Index LA diam:  4.60 cm  2.09 cm/m  RA Area:     20.00 cm LA Vol (A2C):   100.0 ml 45.35 ml/m RA Volume:   56.70 ml  25.71 ml/m LA Vol (A4C):   96.1 ml  43.58 ml/m LA Biplane Vol: 99.1 ml  44.94 ml/m  AORTIC VALVE LVOT Vmax:   120.00 cm/s LVOT Vmean:  83.900 cm/s LVOT VTI:    0.201 m  AORTA Ao Root diam: 3.40 cm MITRAL VALVE MV Area (PHT): 4.80 cm              SHUNTS MV PHT:        45.82 msec            Systemic VTI:  0.20 m MV Decel Time: 158 msec              Systemic Diam: 2.20 cm MV E velocity: 100.00 cm/s 103 cm/s MV A velocity: 116.00 cm/s 70.3 cm/s MV E/A ratio:  0.86        1.5  Candee Furbish MD Electronically signed by Candee Furbish MD Signature Date/Time: 06/28/2019/12:55:04 PM    Final         Scheduled Meds: . allopurinol  100 mg Oral BID  . aspirin  81 mg Oral Daily  . Chlorhexidine Gluconate Cloth  6 each Topical Q0600  . ferric citrate  420 mg Oral TID WC  . heparin  5,000 Units Subcutaneous BID  . heparin  6,000 Units Dialysis Once in dialysis  . insulin aspart  0-6 Units Subcutaneous Q4H  . mouth rinse  15 mL Mouth Rinse BID   Continuous Infusions: . sodium chloride    . sodium chloride    . vancomycin       LOS: 2 days   Time spent: Mabel, MD [facility] Pager [phone]  If 7PM-7AM, please contact night-coverage [email or web address] Password [facility] 06/30/2019, 9:57 AM

## 2019-06-30 NOTE — Progress Notes (Signed)
*  PRELIMINARY RESULTS* Echocardiogram 2D Echocardiogram LIMITED has been performed.  Leavy Cella 06/30/2019, 11:41 AM

## 2019-06-30 NOTE — Progress Notes (Signed)
CPAP setup at bedside. Pt states he does not want to wear tonight but may try later. I told him if he needs any further assistance to call RT.

## 2019-06-30 NOTE — Consult Note (Addendum)
[facility] for Infectious Disease    Date of Admission:  06/27/2019           Day 3+ vancomycin       Reason for Consult: Coag negative staph bacteremia    Referring Provider: Dr. Ina Homes  Assessment: He may have had true coag negative staph bacteremia on admission (versus two contaminated sets).  I do not see any evidence of any complications of bacteremia.  He had no evidence of endocarditis by TTE or exam.  Repeat blood cultures are negative at 24 hours.  He does not have a catheter to contend with.  I recommend 10 more days of vancomycin dosed at the end of hemodialysis.  Plan: 1. Vancomycin x 10 more days 2. Please call if I can be of further assistance  Principal Problem:   Coagulase negative Staphylococcus bacteremia Active Problems:   Acute hypoxemic respiratory failure (HCC)   Coronary atherosclerosis   OSA treated with BiPAP   Stage 5 chronic kidney disease on chronic dialysis (HCC)   DM (diabetes mellitus), type 2 with renal complications (HCC)   HLD (hyperlipidemia)   Hypertension associated with diabetes ([place])   Diabetic neuropathy ([place])   History of cardiac arrest   Sacral decubitus ulcer, stage IV (HCC)   Myoclonic jerking   Scheduled Meds: . allopurinol  100 mg Oral BID  . aspirin  81 mg Oral Daily  . Chlorhexidine Gluconate Cloth  6 each Topical Q0600  . ferric citrate  420 mg Oral TID WC  . heparin  5,000 Units Subcutaneous BID  . heparin  6,000 Units Dialysis Once in dialysis  . insulin aspart  0-6 Units Subcutaneous Q4H  . mouth rinse  15 mL Mouth Rinse BID   Continuous Infusions: . sodium chloride    . sodium chloride    . vancomycin     PRN Meds:.sodium chloride, sodium chloride, acetaminophen, alteplase, heparin, ipratropium-albuterol, lidocaine (PF), lidocaine-prilocaine, pentafluoroprop-tetrafluoroeth, polyethylene glycol, senna  HPI: Troy Wells is a 54 y.o. male with end-stage renal disease on hemodialysis and  multiple medical problems.  He was hospitalized from 75/03/2584-27/78/2423 with complications of [id number].  He was readmitted on 06/27/2019 with acute onset of dry cough, shortness of breath and hypoxia.  Chest x-ray showed diffuse interstitial and airspace disease.  CT scan showed evidence of diffuse pulmonary edema.  He was not febrile.  He improved rapidly following hemodialysis even though he had not missed any recent treatments.  Blood cultures were done on admission and both sets have grown coagulase-negative staph.  We were asked to give treatment recommendations.  He does not have a dialysis catheter.  He is dialyzed through a left upper arm fistula   Review of Systems: Review of Systems  Constitutional: Negative for chills, diaphoresis and fever.  HENT: Negative for congestion and sore throat.   Respiratory: Positive for cough and shortness of breath. Negative for sputum production and wheezing.   Cardiovascular: Negative for chest pain.  Gastrointestinal: Negative for abdominal pain, diarrhea, nausea and vomiting.  Musculoskeletal: Negative for back pain.  Skin: Negative for rash.  Neurological: Positive for tremors.       He has had diffuse myoclonic jerking of unknown cause for several months.    Past Medical History:  Diagnosis Date  . CARDIAC ARREST 11/01/2009   Qualifier: Diagnosis of  By: Selena Batten CMA, [place]    . Colon polyps 01/02/2016   Colonoscopy July 2017 - One 3 mm polyp  in the transverse colon, removed with a cold biopsy forceps. Resected and retrieved. - One 3 mm polyp in the rectum, removed with a cold biopsy forceps. Resected and retrieved. - Diverticulosis in the entire examined colon. - Non-bleeding internal hemorrhoids. - The examination was otherwise normal. - Significant looping which prolonged cecal    . Coronary artery disease 08/15/2009   with AMI  . Coronary atherosclerosis 11/01/2009   Qualifier: Diagnosis of  By: Selena Batten CMA, [place]    . Dialysis  patient ([facility]) 09/27/2014   mon, wed, and fri  . Diverticulosis of colon without hemorrhage 01/02/2016   Colonoscopy July 2017 - One 3 mm polyp in the transverse colon, removed with a cold biopsy forceps. Resected and retrieved. - One 3 mm polyp in the rectum, removed with a cold biopsy forceps. Resected and retrieved. - Diverticulosis in the entire examined colon. - Non-bleeding internal hemorrhoids. - The examination was otherwise normal. - Significant looping which prolonged cecal    . DM (diabetes mellitus), type 2 with renal complications ([place]) 08/67/6195  . Essential hypertension 11/01/2009   Qualifier: Diagnosis of  By: Selena Batten CMA, [place]    . Gout 10/21/2018  . History of acute respiratory distress syndrome (ARDS) due to COVID-19 virus   . History of cardiac arrest 04/27/2019  . History of non-ST elevation myocardial infarction (NSTEMI) 04/18/2019  . history of respiratory arrest   . Hyperlipidemia   . Hypertension   . Mitral valve regurgitation 09/19/2015   Echo 09/2015   . 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 OSA treated with BiPAP 06/17/2010  . Sleep apnea    cpap nightly    Social History   Tobacco Use  . Smoking status: Former Smoker    Packs/day: 1.00    Years: 10.00    Pack years: 10.00    Types: Cigars    Quit date: 06/18/2011    Years since quitting: 8.0  . Smokeless tobacco: Never Used  Substance Use Topics  . Alcohol use: Yes    Alcohol/week: 1.0 standard drinks    Types: 1 Standard drinks or equivalent per week    Comment: beer or liquor occasionally  . Drug use: No    Family History  Problem Relation Age of Onset  . Hypertension Mother   . Heart disease Mother 31       cause of death  . Hypertension Father   . Aneurysm Father 65       brain aneurysm  . Heart disease Father 36       cause of death  . Hypertension Brother   . Alcohol abuse Brother   . Diabetes Brother   . Pancreatitis Brother   . Hypertension Brother   . Colon cancer Neg Hx    No Known  Allergies  OBJECTIVE: Blood pressure (!) 140/91, pulse 80, temperature 98.6 F (37 C), temperature source Oral, resp. rate 19, height 5\' 11"  (1.803 m), weight 99.2 kg, SpO2 100 %.  Physical Exam Constitutional:      Comments: He is sitting up in bed watching [employer] on television.  He is in good spirits.  Cardiovascular:     Rate and Rhythm: Normal rate and regular rhythm.     Comments: He has a faint murmur over his left upper chest which is probably radiation from his left arm fistula. Pulmonary:     Effort: Pulmonary effort is normal.     Breath sounds: Normal breath sounds.  Chest:    Abdominal:  Palpations: Abdomen is soft.     Tenderness: There is no abdominal tenderness.  Skin:    Comments: Small but deep sacral decubitus without any evidence of infection.       Lab Results Lab Results  Component Value Date   WBC 6.9 06/29/2019   HGB 9.9 (L) 06/29/2019   HCT 31.8 (L) 06/29/2019   MCV 89.6 06/29/2019   PLT 310 06/29/2019    Lab Results  Component Value Date   CREATININE 9.22 (H) 06/29/2019   BUN 29 (H) 06/29/2019   NA 140 06/29/2019   K 4.1 06/29/2019   CL 98 06/29/2019   CO2 28 06/29/2019    Lab Results  Component Value Date   ALT 24 06/27/2019   AST 28 06/27/2019   ALKPHOS 88 06/27/2019   BILITOT 0.9 06/27/2019     Microbiology: Recent Results (from the past 240 hour(s))  Blood Culture (routine x 2)     Status: None (Preliminary result)   Collection Time: 06/27/19 10:24 PM   Specimen: BLOOD RIGHT HAND  Result Value Ref Range Status   Specimen Description BLOOD RIGHT HAND  Final   Special Requests AEROBIC BOTTLE ONLY Blood Culture adequate volume  Final   Culture  Setup Time   Final    GRAM POSITIVE COCCI AEROBIC BOTTLE ONLY CRITICAL VALUE NOTED.  VALUE IS CONSISTENT WITH PREVIOUSLY REPORTED AND CALLED VALUE. Performed at [facility], [place] [address], [place], [place] [address]    Culture GRAM POSITIVE COCCI  Final   Report  Status PENDING  Incomplete  Blood Culture ID Panel (Reflexed)     Status: Abnormal   Collection Time: 06/27/19 10:24 PM  Result Value Ref Range Status   Enterococcus species NOT DETECTED NOT DETECTED Final   Listeria monocytogenes NOT DETECTED NOT DETECTED Final   Staphylococcus species DETECTED (A) NOT DETECTED Final    Comment: Methicillin (oxacillin) resistant coagulase negative staphylococcus. Possible blood culture contaminant (unless isolated from more than one blood culture draw or clinical case suggests pathogenicity). No antibiotic treatment is indicated for blood  culture contaminants. CRITICAL RESULT CALLED TO, READ BACK BY AND VERIFIED WITH: Bronwen Betters PharmD 16:30 06/29/19 (wilsonm)    Staphylococcus aureus (BCID) NOT DETECTED NOT DETECTED Final   Methicillin resistance DETECTED (A) NOT DETECTED Final    Comment: CRITICAL RESULT CALLED TO, READ BACK BY AND VERIFIED WITH: Bronwen Betters PharmD 16:30 06/29/19 (wilsonm)    Streptococcus species NOT DETECTED NOT DETECTED Final   Streptococcus agalactiae NOT DETECTED NOT DETECTED Final   Streptococcus pneumoniae NOT DETECTED NOT DETECTED Final   Streptococcus pyogenes NOT DETECTED NOT DETECTED Final   Acinetobacter baumannii NOT DETECTED NOT DETECTED Final   Enterobacteriaceae species NOT DETECTED NOT DETECTED Final   Enterobacter cloacae complex NOT DETECTED NOT DETECTED Final   Escherichia coli NOT DETECTED NOT DETECTED Final   Klebsiella oxytoca NOT DETECTED NOT DETECTED Final   Klebsiella pneumoniae NOT DETECTED NOT DETECTED Final   Proteus species NOT DETECTED NOT DETECTED Final   Serratia marcescens NOT DETECTED NOT DETECTED Final   Haemophilus influenzae NOT DETECTED NOT DETECTED Final   Neisseria meningitidis NOT DETECTED NOT DETECTED Final   Pseudomonas aeruginosa NOT DETECTED NOT DETECTED Final   Candida albicans NOT DETECTED NOT DETECTED Final   Candida glabrata NOT DETECTED NOT DETECTED Final   Candida krusei NOT  DETECTED NOT DETECTED Final   Candida parapsilosis NOT DETECTED NOT DETECTED Final   Candida tropicalis NOT DETECTED NOT DETECTED Final  Comment: Performed at [facility], [place] [address] [place], [place] [address]  Blood Culture (routine x 2)     Status: Abnormal (Preliminary result)   Collection Time: 06/27/19 10:25 PM   Specimen: BLOOD RIGHT HAND  Result Value Ref Range Status   Specimen Description BLOOD RIGHT HAND  Final   Special Requests   Final    BOTTLES DRAWN AEROBIC AND ANAEROBIC Blood Culture adequate volume   Culture  Setup Time   Final    GRAM POSITIVE COCCI IN BOTH AEROBIC AND ANAEROBIC BOTTLES CRITICAL RESULT CALLED TO, READ BACK BY AND VERIFIED WITH: PHARMD V [place] [id number] AT 85 AM BY CM    Culture (A)  Final    STAPHYLOCOCCUS SPECIES (COAGULASE NEGATIVE) THE SIGNIFICANCE OF ISOLATING THIS ORGANISM FROM A SINGLE SET OF BLOOD CULTURES WHEN MULTIPLE SETS ARE DRAWN IS UNCERTAIN. PLEASE NOTIFY THE MICROBIOLOGY DEPARTMENT WITHIN ONE WEEK IF SPECIATION AND SENSITIVITIES ARE REQUIRED. Performed at [facility], [place] [address], [place], [place] [address]    Report Status PENDING  Incomplete  Respiratory Panel by RT PCR (Flu A&B, Covid) - Nasopharyngeal Swab     Status: None   Collection Time: 06/27/19 11:52 PM   Specimen: Nasopharyngeal Swab  Result Value Ref Range Status   SARS Coronavirus 2 by RT PCR NEGATIVE NEGATIVE Final    Comment: (NOTE) SARS-CoV-2 target nucleic acids are NOT DETECTED. The SARS-CoV-2 RNA is generally detectable in upper respiratoy specimens during the acute phase of infection. The lowest concentration of SARS-CoV-2 viral copies this assay can detect is 131 copies/mL. A negative result does not preclude SARS-Cov-2 infection and should not be used as the sole basis for treatment or other patient management decisions. A negative result may occur with  improper specimen collection/handling, submission of specimen other than  nasopharyngeal swab, presence of viral mutation(s) within the areas targeted by this assay, and inadequate number of viral copies (<131 copies/mL). A negative result must be combined with clinical observations, patient history, and epidemiological information. The expected result is Negative. Fact Sheet for Patients:  PinkCheek.be Fact Sheet for Healthcare Providers:  GravelBags.it This test is not yet ap proved or cleared by the [place] FDA and  has been authorized for detection and/or diagnosis of SARS-CoV-2 by FDA under an Emergency Use Authorization (EUA). This EUA will remain  in effect (meaning this test can be used) for the duration of the COVID-19 declaration under Section 564(b)(1) of the Act, 21 U.S.C. section 360bbb-3(b)(1), unless the authorization is terminated or revoked sooner.    Influenza A by PCR NEGATIVE NEGATIVE Final   Influenza B by PCR NEGATIVE NEGATIVE Final    Comment: (NOTE) The Xpert Xpress SARS-CoV-2/FLU/RSV assay is intended as an aid in  the diagnosis of influenza from Nasopharyngeal swab specimens and  should not be used as a sole basis for treatment. Nasal washings and  aspirates are unacceptable for Xpert Xpress SARS-CoV-2/FLU/RSV  testing. Fact Sheet for Patients: PinkCheek.be Fact Sheet for Healthcare Providers: GravelBags.it This test is not yet approved or cleared by the [place] FDA and  has been authorized for detection and/or diagnosis of SARS-CoV-2 by  FDA under an Emergency Use Authorization (EUA). This EUA will remain  in effect (meaning this test can be used) for the duration of the  Covid-19 declaration under Section 564(b)(1) of the Act, 21  U.S.C. section 360bbb-3(b)(1), unless the authorization is  terminated or revoked. Performed at [facility], [place] [address],  [place], [place] [address]   MRSA PCR  Screening     Status: None   Collection Time: 06/28/19  4:57 AM   Specimen: Nasal Mucosa; Nasopharyngeal  Result Value Ref Range Status   MRSA by PCR NEGATIVE NEGATIVE Final    Comment:        The GeneXpert MRSA Assay (FDA approved for NASAL specimens only), is one component of a comprehensive MRSA colonization surveillance program. It is not intended to diagnose MRSA infection nor to guide or monitor treatment for MRSA infections. Performed at [facility], [place] [address] [place], [place] [address]   Culture, blood (routine x 2)     Status: None (Preliminary result)   Collection Time: 06/29/19  6:19 PM   Specimen: BLOOD  Result Value Ref Range Status   Specimen Description BLOOD RIGHT ANTECUBITAL  Final   Special Requests   Final    BOTTLES DRAWN AEROBIC AND ANAEROBIC Blood Culture adequate volume   Culture   Final    NO GROWTH < 24 HOURS Performed at [facility], [place] [address] [place], [place] [address]    Report Status PENDING  Incomplete  Culture, blood (Routine X 2) w Reflex to ID Panel     Status: None (Preliminary result)   Collection Time: 06/29/19  6:25 PM   Specimen: BLOOD RIGHT HAND  Result Value Ref Range Status   Specimen Description BLOOD RIGHT HAND  Final   Special Requests   Final    BOTTLES DRAWN AEROBIC AND ANAEROBIC Blood Culture adequate volume   Culture   Final    NO GROWTH < 24 HOURS Performed at [facility], [place] [address], [place], [place] [address]    Report Status PENDING  Incomplete    Michel Bickers, MD [facility] [phone] pager   [phone] cell 06/30/2019, 10:04 AM

## 2019-06-30 NOTE — Progress Notes (Addendum)
  [facility] Progress Note   Subjective: Having violent jerking movements upper and lower extremities. Otherwise, No complaints, on RA denies SOB.   Objective Vitals:   06/29/19 2235 06/29/19 2306 06/30/19 0421 06/30/19 0500  BP: (!) 144/90  (!) 140/91   Pulse: 90 85 80   Resp:  16 19   Temp:   98.6 F (37 C)   TempSrc:   Oral   SpO2:  95% 100%   Weight:    99.2 kg  Height:       Physical Exam General: Pleasant, WN,WD male in NAD Heart: S1,S2 RRR Lungs: CTAB at present. No WOB.  Abdomen: S,NT active BS Extremities: No Le edema.  Neuro: Having UE/LE generalized jerking movements that he cannot control. A & O X 3.  Skin: Warm dry no cyanosis, has sacral wound with drsg intact. Dialysis Access: L AVF + bruit  Medications: . sodium chloride    . sodium chloride    . vancomycin     . allopurinol  100 mg Oral BID  . aspirin  81 mg Oral Daily  . Chlorhexidine Gluconate Cloth  6 each Topical Q0600  . ferric citrate  420 mg Oral TID WC  . heparin  5,000 Units Subcutaneous BID  . heparin  6,000 Units Dialysis Once in dialysis  . insulin aspart  0-6 Units Subcutaneous Q4H  . mouth rinse  15 mL Mouth Rinse BID     HD orders: [place] MWF 4.5 hrs 200NRe 450/800 99kg 2.0K/2.0 Ca UFP 4 AVF -Heparin 6000 units IV initial bolus Heparin 2500 units IV mid run TIW -Mircera 200 mcg IV q 2 weeks (last dose 06/19/2019)  Background: 54 Y/O male S/P COVID 19 presented with pulmonary edema, volume overload, HCAP.   Assessment/Plan: 1. Pulmonary Edema: No missed treatments, was getting to OP EDW. Resolved with HD. Needs lower wt on discharge.  2. HCAP-ABX per primary 3. Jerking movements/myoclonus UE/LE. He has had these in past and been seen by [facility]UR[facility]. Will ask primary to re-consult as this is getting worse. He is not uremic, has not missed HD treatments.  Check ammonia, LFTs. No medications on med list that could contribute.  Have asked nursing staff to assist with meals.   3. ESRD -MWF-next HD today. Truncated treatment time D/T high pt census. Modified heparin dose.  4. Anemia -HGB 9.9 recent ESA dose as OP. Follow HGB.  5. Secondary hyperparathyroidism - Ca 10.1 C Ca 10.8 Phos OK. Use 2.0 Ca bath, continue Auryxia binders. No VDRA.  6. HTN/volume - HD 01/11 Net UF 5.5 liters. Post wt 95.3. Continue lowered volume as tolerated.  BP controlled 7. Nutrition -Renal/Carb mod diet, nepro, renal vit 8. DM-per primary 9. Sacral Decub-per primary  Rita H. Brown NP-C 06/30/2019, 9:27 AM  [facility] [phone]  Pt seen, examined and agree w assess/plan as above with additions as indicated.  [facility] 06/30/2019, 12:15 PM

## 2019-06-30 NOTE — Consult Note (Signed)
Neurology Consultation  Reason for Consult: Myoclonus  Referring Physician: Dr. Sharlet Salina  CC: Myoclonus  History is obtained from: Chart  HPI: Troy Wells is a 54 y.o. male with history of hypertension, hyperlipidemia, respiratory arrest, myoclonic jerking since 06/09/2019, acute respiratory distress due to COVID-19, cardiac arrest and CAD.  Patient recently had a prolonged hospitalization from 10/28-12/30/2020 for COVID-19 pneumonia and ARDS.   EEG was obtained at that time and showed no epileptiform activity other than myoclonic activity.    Patient again returned to hospital via EMS on 06/27/2019.  On arrival he was very hypoxic with oxygen saturations reported in the 50s.  Looking through the notes, myoclonic jerking was noted on arrival.  Patient was noted to be septic at that time  Chest CT showed diffuse pulmonary edema with consolidation in the lingula. At that time he was treated with vancomycin and cefepime for sepsis. However, throughout hospitalization the patient's myoclonus has been worsening. Neurology was consulted.  As stated above the patient did have myoclonus first noted in his lifetime during his admission for ARDS back in October - December.  It seems as though the patient's myoclonus has worsened after his second significant hypoxic event which brought him to the hospital this time.  Patient states that he does not remember having jerking of arms and legs as significant as this prior to this hospitalization.  He does note that it is not present at rest but very notably present when moving or with static antigravity muscular effort.  Looking through patient's labs, his BUN and Cr have remained fairly stable on dialysis.   Past Medical History:  Diagnosis Date  . CARDIAC ARREST 11/01/2009   Qualifier: Diagnosis of  By: Selena Batten CMA, Jewel    . Colon polyps 01/02/2016   Colonoscopy July 2017 - One 3 mm polyp in the transverse colon, removed with a cold biopsy forceps.  Resected and retrieved. - One 3 mm polyp in the rectum, removed with a cold biopsy forceps. Resected and retrieved. - Diverticulosis in the entire examined colon. - Non-bleeding internal hemorrhoids. - The examination was otherwise normal. - Significant looping which prolonged cecal    . Coronary artery disease 08/15/2009   with AMI  . Coronary atherosclerosis 11/01/2009   Qualifier: Diagnosis of  By: Selena Batten CMA, Jewel    . Dialysis patient ([place]) 09/27/2014   mon, wed, and fri  . Diverticulosis of colon without hemorrhage 01/02/2016   Colonoscopy July 2017 - One 3 mm polyp in the transverse colon, removed with a cold biopsy forceps. Resected and retrieved. - One 3 mm polyp in the rectum, removed with a cold biopsy forceps. Resected and retrieved. - Diverticulosis in the entire examined colon. - Non-bleeding internal hemorrhoids. - The examination was otherwise normal. - Significant looping which prolonged cecal    . DM (diabetes mellitus), type 2 with renal complications ([place]) 38/25/0539  . Essential hypertension 11/01/2009   Qualifier: Diagnosis of  By: Selena Batten CMA, Jewel    . Gout 10/21/2018  . History of acute respiratory distress syndrome (ARDS) due to COVID-19 virus   . History of cardiac arrest 04/27/2019  . History of non-ST elevation myocardial infarction (NSTEMI) 04/18/2019  . history of respiratory arrest   . Hyperlipidemia   . Hypertension   . Mitral valve regurgitation 09/19/2015   Echo 09/2015   . 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 OSA treated with BiPAP 06/17/2010  . Sleep apnea    cpap nightly    Family History  Problem Relation  Age of Onset  . Hypertension Mother   . Heart disease Mother 66       cause of death  . Hypertension Father   . Aneurysm Father 4       brain aneurysm  . Heart disease Father 100       cause of death  . Hypertension Brother   . Alcohol abuse Brother   . Diabetes Brother   . Pancreatitis Brother   . Hypertension Brother   . Colon  cancer Neg Hx    Social History:   reports that he quit smoking about 8 years ago. His smoking use included cigars. He has a 10.00 pack-year smoking history. He has never used smokeless tobacco. He reports current alcohol use of about 1.0 standard drinks of alcohol per week. He reports that he does not use drugs.  Medications  Current Facility-Administered Medications:  .  heparin 1000 UNIT/ML injection, , , ,  .  0.9 %  sodium chloride infusion, 100 mL, Intravenous, PRN, Joelyn Oms, Ryan B, MD .  0.9 %  sodium chloride infusion, 100 mL, Intravenous, PRN, Joelyn Oms, Ryan B, MD .  0.9 %  sodium chloride infusion, 100 mL, Intravenous, PRN, Valentina Gu, NP .  0.9 %  sodium chloride infusion, 100 mL, Intravenous, PRN, Valentina Gu, NP .  acetaminophen (TYLENOL) tablet 650 mg, 650 mg, Oral, Q4H PRN, Frederik Pear, MD, 650 mg at 06/30/19 1137 .  allopurinol (ZYLOPRIM) tablet 100 mg, 100 mg, Oral, BID, Maryjane Hurter, MD, 100 mg at 06/30/19 1022 .  alteplase (CATHFLO ACTIVASE) injection 2 mg, 2 mg, Intracatheter, Once PRN, Pearson Grippe B, MD .  aspirin chewable tablet 81 mg, 81 mg, Oral, Daily, Verlee Monte Hortencia Conradi, MD, Stopped at 06/30/19 1305 .  atorvastatin (LIPITOR) tablet 80 mg, 80 mg, Oral, Daily, Pricilla Holm A, MD .  carvedilol (COREG) tablet 6.25 mg, 6.25 mg, Oral, BID WC, Hoyt Koch, MD .  Chlorhexidine Gluconate Cloth 2 % PADS 6 each, 6 each, Topical, Q0600, Rexene Agent, MD, 6 each at 06/30/19 0617 .  ferric citrate (AURYXIA) tablet 420 mg, 420 mg, Oral, TID WC, Valentina Gu, NP, 420 mg at 06/30/19 1022 .  heparin injection 1,000 Units, 1,000 Units, Dialysis, PRN, Rexene Agent, MD .  Derrill Memo ON 07/01/2019] heparin injection 12,000 Units, 12,000 Units, Dialysis, Once in dialysis, Roney Jaffe, MD .  heparin injection 3,000 Units, 3,000 Units, Dialysis, Once in dialysis, Valentina Gu, NP .  heparin injection 5,000 Units, 5,000  Units, Subcutaneous, BID, Maryjane Hurter, MD, 5,000 Units at 06/29/19 2127 .  heparin injection 6,000 Units, 6,000 Units, Dialysis, Once in dialysis, Pearson Grippe B, MD .  insulin aspart (novoLOG) injection 0-6 Units, 0-6 Units, Subcutaneous, Q4H, Gleason, Otilio Carpen, PA-C, Stopped at 06/30/19 1306 .  ipratropium-albuterol (DUONEB) 0.5-2.5 (3) MG/3ML nebulizer solution 3 mL, 3 mL, Nebulization, Q4H PRN, Maryjane Hurter, MD .  lidocaine (PF) (XYLOCAINE) 1 % injection 5 mL, 5 mL, Intradermal, PRN, Pearson Grippe B, MD .  lidocaine-prilocaine (EMLA) cream 1 application, 1 application, Topical, PRN, Pearson Grippe B, MD .  MEDLINE mouth rinse, 15 mL, Mouth Rinse, BID, Gleason, Otilio Carpen, PA-C, 15 mL at 06/28/19 1021 .  pentafluoroprop-tetrafluoroeth (GEBAUERS) aerosol 1 application, 1 application, Topical, PRN, Sanford, Ryan B, MD .  polyethylene glycol (MIRALAX / GLYCOLAX) packet 17 g, 17 g, Oral, Daily PRN, Maryjane Hurter, MD .  senna [facility]) tablet 8.6 mg, 1 tablet, Oral, Daily  PRN, Maryjane Hurter, MD .  vancomycin (VANCOCIN) IVPB 1000 mg/200 mL premix, 1,000 mg, Intravenous, Q M,W,F-HD, Candee Furbish, MD, Last Rate: 200 mL/hr at 06/30/19 1413, 1,000 mg at 06/30/19 1413  ROS:  Unable to obtain due to altered mental status.   General ROS: negative for - chills, fatigue, fever, night sweats, weight gain or weight loss Psychological ROS: negative for - behavioral disorder, hallucinations, memory difficulties, mood swings or suicidal ideation Ophthalmic ROS: negative for - blurry vision, double vision, eye pain or loss of vision ENT ROS: negative for - epistaxis, nasal discharge, oral lesions, sore throat, tinnitus or vertigo Respiratory ROS: negative for - cough, hemoptysis, shortness of breath or wheezing Cardiovascular ROS: negative for - chest pain, dyspnea on exertion, edema or irregular heartbeat Gastrointestinal ROS: negative for - abdominal pain, diarrhea, hematemesis,  nausea/vomiting or stool incontinence Genito-Urinary ROS: negative for - dysuria, hematuria, incontinence or urinary frequency/urgency Musculoskeletal ROS: negative for -myoclonus Neurological ROS: as noted in HPI Dermatological ROS: negative for rash and skin lesion changes  Exam: Current vital signs: BP (!) 148/88   Pulse 90   Temp 98.4 F (36.9 C) (Oral)   Resp 20   Ht 5\' 11"  (1.803 m)   Wt 100 kg   SpO2 100%   BMI 30.75 kg/m  Vital signs in last 24 hours: Temp:  [98.4 F (36.9 C)-99 F (37.2 C)] 98.4 F (36.9 C) (01/13 1200) Pulse Rate:  [80-100] 90 (01/13 1400) Resp:  [16-20] 20 (01/13 1200) BP: (138-176)/(80-104) 148/88 (01/13 1400) SpO2:  [95 %-100 %] 100 % (01/13 1200) Weight:  [99.2 kg-100 kg] 100 kg (01/13 1200)   Constitutional: Appears well-developed and well-nourished.  Psych: Affect appropriate to situation Eyes: No scleral injection HENT: No OP obstrucion Head: Normocephalic.  Cardiovascular: Normal rate and regular rhythm.  Respiratory: Effort normal, non-labored breathing GI: Soft.  No distension. There is no tenderness.  Skin: WDI  Neuro: Mental Status: Patient is awake, alert, oriented to person, place, month, year, and situation. Speech-no issues with naming, repeating,comprehension Patient is able to give a clear and coherent history. Cranial Nerves: II: Visual Fields are full. PERRL III,IV, VI: EOMI without ptosis or diplopia.  V: Facial sensation is symmetric to temperature VII: Facial movement is symmetric.  VIII: hearing is intact to voice X: Palate elevates symmetrically XI: Shoulder shrug is symmetric. XII: tongue is midline with myoclonic movements if held statically out of his mouth.  Motor: Tone is normal. Bulk is normal.  5/5 strength was present in all four extremities.  At rest patient does not show any myoclonic or hyperkinetic movements.  However, if patient attempts to perform an action such as touching his nose or reaching  out to grab an object, the limb in use exhibits high amplitude, rapid hyperkinetic movements appearing most consistent with action myoclonus.  When arms are held out straight he also exhibits myoclonus, but it is milder than with movement.   Sensory: Sensation is symmetric to light touch and temperature in the arms and legs. Deep Tendon Reflexes: 2+ and symmetric in the biceps and patellae.  Plantars: Toes are downgoing bilaterally.  Cerebellar: FNF and HKS show significant hyperkinetic movements  Labs I have reviewed labs in epic and the results pertinent to this consultation are:   CBC    Component Value Date/Time   WBC 7.0 06/30/2019 1235   RBC 3.46 (L) 06/30/2019 1235   HGB 9.9 (L) 06/30/2019 1235   HCT 30.5 (L) 06/30/2019 1235  PLT 317 06/30/2019 1235   MCV 88.2 06/30/2019 1235   MCH 28.6 06/30/2019 1235   MCHC 32.5 06/30/2019 1235   RDW 17.8 (H) 06/30/2019 1235   LYMPHSABS 5.1 (H) 06/27/2019 2224   MONOABS 0.8 06/27/2019 2224   EOSABS 0.2 06/27/2019 2224   BASOSABS 0.1 06/27/2019 2224    CMP     Component Value Date/Time   NA 138 06/30/2019 1235   K 3.8 06/30/2019 1235   CL 96 (L) 06/30/2019 1235   CO2 26 06/30/2019 1235   GLUCOSE 104 (H) 06/30/2019 1235   BUN 39 (H) 06/30/2019 1235   CREATININE 11.38 (H) 06/30/2019 1235   CREATININE 9.06 (H) 08/31/2015 1041   CALCIUM 10.5 (H) 06/30/2019 1235   PROT 6.5 06/30/2019 1001   ALBUMIN 2.9 (L) 06/30/2019 1235   AST 20 06/30/2019 1001   ALT 21 06/30/2019 1001   ALKPHOS 77 06/30/2019 1001   BILITOT 0.9 06/30/2019 1001   GFRNONAA 4 (L) 06/30/2019 1235   GFRNONAA 6 (L) 08/31/2015 1041   GFRAA 5 (L) 06/30/2019 1235   GFRAA 7 (L) 08/31/2015 1041    Lipid Panel     Component Value Date/Time   CHOL 207 (H) 04/14/2019 0200   CHOL 147 10/21/2018 1119   TRIG 100 06/27/2019 2224   HDL 20 (L) 04/14/2019 0200   HDL 34 (L) 10/21/2018 1119   CHOLHDL 10.4 04/14/2019 0200   VLDL 43 (H) 04/14/2019 0200   LDLCALC 144 (H)  04/14/2019 0200   LDLCALC 70 10/21/2018 1119     Imaging I have reviewed the images obtained:  Etta Quill PA-C [facility] [phone] 06/30/2019, 2:18 PM     Assessment: 54 year old male with ESRD on HD re-presenting to the hospital with recurrence of respiratory distress after prolonged hospitalization due to Covid PNA.  At that time patient suffered hypoxemia and ARDS and had new onset of action myoclonus.  Patient returns to hospital on this occasion secondary to having significant lung consolidation with gram-negative Staphylococcus infection.  Throughout hospitalization is noted that patient has had increased hyperkinetic movements with action; however, none at rest.   1. On exam, at rest patient does not show any myoclonic or hyperkinetic movements.  However, if patient attempts to perform an action such as touching his nose or reaching out to grab an object, the limb in use exhibits high amplitude, rapid hyperkinetic movements appearing most consistent with action myoclonus.   2. Action myoclonus is relatively uncommon and usually is associated with an episode of acute anoxia. It may also be seen in high altitude mountaineering due to chronic hypoxemia and perhaps the patient's ARDS has resulted in a similar physiological scenario as high altitude hypoxemia. It can also occur with metabolic derangements, although it is noted that the patient's BUN/Cr have been relatively stable on HD. We have gone through his medications and his labs which appear to not show any etiology for these findings.   Impression: -Acute respiratory failure with hypoxia -Pneumonia on admission now being treated with vancomycin and cefepime -Bacteremia with coag negative staph -Post hypoxic myoclonus -End-stage renal disease on HD followed by nephrology  Recommendations: --Would repeat dialysis tomorrow (dialysis for 2 days in a row) and call Neurology afterwards for assessment of possible  improvement of action myoclonus. -Often cortical myoclonus can be treated with clonazepam starting with 0.5 mg twice daily. Would start this regimen and assess for improvement if repeat dialysis is not an option tomorrow or if the patient's action myoclonus does not  improve with dialysis on 2 consecutive days.   I have seen and examined the patient. I have formulated the assessment and recommendations. 54 year old male with action myoclonus. Recommendations as above.  Electronically signed: Dr. Kerney Elbe

## 2019-07-01 ENCOUNTER — Other Ambulatory Visit: Payer: Self-pay

## 2019-07-01 DIAGNOSIS — R630 Anorexia: Secondary | ICD-10-CM | POA: Diagnosis not present

## 2019-07-01 DIAGNOSIS — G4733 Obstructive sleep apnea (adult) (pediatric): Secondary | ICD-10-CM | POA: Diagnosis not present

## 2019-07-01 DIAGNOSIS — I120 Hypertensive chronic kidney disease with stage 5 chronic kidney disease or end stage renal disease: Secondary | ICD-10-CM | POA: Diagnosis not present

## 2019-07-01 DIAGNOSIS — D631 Anemia in chronic kidney disease: Secondary | ICD-10-CM | POA: Diagnosis not present

## 2019-07-01 DIAGNOSIS — E1140 Type 2 diabetes mellitus with diabetic neuropathy, unspecified: Secondary | ICD-10-CM | POA: Diagnosis not present

## 2019-07-01 DIAGNOSIS — N186 End stage renal disease: Secondary | ICD-10-CM | POA: Diagnosis not present

## 2019-07-01 DIAGNOSIS — I10 Essential (primary) hypertension: Secondary | ICD-10-CM | POA: Diagnosis not present

## 2019-07-01 DIAGNOSIS — L89154 Pressure ulcer of sacral region, stage 4: Secondary | ICD-10-CM | POA: Diagnosis not present

## 2019-07-01 DIAGNOSIS — G9340 Encephalopathy, unspecified: Secondary | ICD-10-CM | POA: Diagnosis not present

## 2019-07-01 DIAGNOSIS — R5381 Other malaise: Secondary | ICD-10-CM | POA: Diagnosis not present

## 2019-07-01 DIAGNOSIS — I200 Unstable angina: Secondary | ICD-10-CM | POA: Diagnosis not present

## 2019-07-01 DIAGNOSIS — J810 Acute pulmonary edema: Secondary | ICD-10-CM | POA: Diagnosis not present

## 2019-07-01 DIAGNOSIS — E1121 Type 2 diabetes mellitus with diabetic nephropathy: Secondary | ICD-10-CM | POA: Diagnosis not present

## 2019-07-01 DIAGNOSIS — E46 Unspecified protein-calorie malnutrition: Secondary | ICD-10-CM | POA: Diagnosis not present

## 2019-07-01 DIAGNOSIS — Z1159 Encounter for screening for other viral diseases: Secondary | ICD-10-CM | POA: Diagnosis not present

## 2019-07-01 DIAGNOSIS — E1159 Type 2 diabetes mellitus with other circulatory complications: Secondary | ICD-10-CM | POA: Diagnosis not present

## 2019-07-01 DIAGNOSIS — E785 Hyperlipidemia, unspecified: Secondary | ICD-10-CM | POA: Diagnosis not present

## 2019-07-01 DIAGNOSIS — G253 Myoclonus: Secondary | ICD-10-CM | POA: Diagnosis not present

## 2019-07-01 DIAGNOSIS — N2581 Secondary hyperparathyroidism of renal origin: Secondary | ICD-10-CM | POA: Diagnosis not present

## 2019-07-01 DIAGNOSIS — J069 Acute upper respiratory infection, unspecified: Secondary | ICD-10-CM | POA: Diagnosis not present

## 2019-07-01 DIAGNOSIS — A499 Bacterial infection, unspecified: Secondary | ICD-10-CM | POA: Diagnosis not present

## 2019-07-01 DIAGNOSIS — J189 Pneumonia, unspecified organism: Secondary | ICD-10-CM | POA: Diagnosis not present

## 2019-07-01 DIAGNOSIS — E782 Mixed hyperlipidemia: Secondary | ICD-10-CM | POA: Diagnosis not present

## 2019-07-01 DIAGNOSIS — E1129 Type 2 diabetes mellitus with other diabetic kidney complication: Secondary | ICD-10-CM | POA: Diagnosis not present

## 2019-07-01 DIAGNOSIS — R7881 Bacteremia: Secondary | ICD-10-CM | POA: Diagnosis not present

## 2019-07-01 DIAGNOSIS — M2550 Pain in unspecified joint: Secondary | ICD-10-CM | POA: Diagnosis not present

## 2019-07-01 DIAGNOSIS — M545 Low back pain: Secondary | ICD-10-CM | POA: Diagnosis not present

## 2019-07-01 DIAGNOSIS — E1122 Type 2 diabetes mellitus with diabetic chronic kidney disease: Secondary | ICD-10-CM | POA: Diagnosis not present

## 2019-07-01 DIAGNOSIS — Z20828 Contact with and (suspected) exposure to other viral communicable diseases: Secondary | ICD-10-CM | POA: Diagnosis not present

## 2019-07-01 DIAGNOSIS — Z7189 Other specified counseling: Secondary | ICD-10-CM | POA: Diagnosis not present

## 2019-07-01 DIAGNOSIS — J9601 Acute respiratory failure with hypoxia: Secondary | ICD-10-CM | POA: Diagnosis not present

## 2019-07-01 DIAGNOSIS — N250 Renal osteodystrophy: Secondary | ICD-10-CM | POA: Diagnosis not present

## 2019-07-01 DIAGNOSIS — Z8739 Personal history of other diseases of the musculoskeletal system and connective tissue: Secondary | ICD-10-CM | POA: Diagnosis not present

## 2019-07-01 DIAGNOSIS — I469 Cardiac arrest, cause unspecified: Secondary | ICD-10-CM | POA: Diagnosis not present

## 2019-07-01 DIAGNOSIS — I2510 Atherosclerotic heart disease of native coronary artery without angina pectoris: Secondary | ICD-10-CM | POA: Diagnosis not present

## 2019-07-01 DIAGNOSIS — B957 Other staphylococcus as the cause of diseases classified elsewhere: Secondary | ICD-10-CM | POA: Diagnosis not present

## 2019-07-01 DIAGNOSIS — Z7401 Bed confinement status: Secondary | ICD-10-CM | POA: Diagnosis not present

## 2019-07-01 DIAGNOSIS — Z23 Encounter for immunization: Secondary | ICD-10-CM | POA: Diagnosis not present

## 2019-07-01 DIAGNOSIS — Z992 Dependence on renal dialysis: Secondary | ICD-10-CM | POA: Diagnosis not present

## 2019-07-01 LAB — GLUCOSE, CAPILLARY
Glucose-Capillary: 97 mg/dL (ref 70–99)
Glucose-Capillary: 105 mg/dL — ABNORMAL HIGH (ref 70–99)
Glucose-Capillary: 111 mg/dL — ABNORMAL HIGH (ref 70–99)
Glucose-Capillary: 193 mg/dL — ABNORMAL HIGH (ref 70–99)
Glucose-Capillary: 81 mg/dL (ref 70–99)
Glucose-Capillary: 204 mg/dL — ABNORMAL HIGH (ref 70–99)

## 2019-07-01 LAB — CBC
HCT: 31.9 % — ABNORMAL LOW (ref 39.0–52.0)
Hemoglobin: 10 g/dL — ABNORMAL LOW (ref 13.0–17.0)
MCH: 27.7 pg (ref 26.0–34.0)
MCHC: 31.3 g/dL (ref 30.0–36.0)
MCV: 88.4 fL (ref 80.0–100.0)
Platelets: 307 10*3/uL (ref 150–400)
RBC: 3.61 MIL/uL — ABNORMAL LOW (ref 4.22–5.81)
RDW: 17.6 % — ABNORMAL HIGH (ref 11.5–15.5)
WBC: 6 10*3/uL (ref 4.0–10.5)
nRBC: 0 % (ref 0.0–0.2)

## 2019-07-01 MED ORDER — CHLORHEXIDINE GLUCONATE CLOTH 2 % EX PADS
6.0000 | MEDICATED_PAD | Freq: Every day | CUTANEOUS | Status: DC
  Administered 2019-07-01: 6 via TOPICAL

## 2019-07-01 MED ORDER — VANCOMYCIN HCL IN DEXTROSE 1-5 GM/200ML-% IV SOLN: 1000.0000 mg | INTRAVENOUS | 0 refills | Status: DC

## 2019-07-01 MED ORDER — CLONAZEPAM 0.5 MG PO TABS: 0.5000 mg | ORAL_TABLET | Freq: Two times a day (BID) | ORAL | 0 refills | Status: DC

## 2019-07-01 MED ORDER — CLONAZEPAM 0.5 MG PO TABS
0.5000 mg | ORAL_TABLET | Freq: Two times a day (BID) | ORAL | Status: DC
  Administered 2019-07-01 (×2): 0.5 mg via ORAL
  Filled 2019-07-01 (×2): qty 1

## 2019-07-01 NOTE — Progress Notes (Signed)
Physical Therapy Treatment Patient Details Name: Troy Wells MRN: [id number] DOB: 28-Jun-1965 Today's Date: 07/01/2019    History of Present Illness 54 yo admitted from SNF with hypoxia. 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 Covid 19 11/3, CHF, ESRD, DM, HTN, CAD, HLD    PT Comments    Pt was very pleasant and agreeable to work with therapy. He states, he knows he is weak and doesn't think walking is a good idea yet but will do the best he can. Pt progressed to EOB with min A and was able to take several small pivot steps to recliner chair. Assist required on BIL LE for quad activation and to prevent buckling. Provided pt with HEP handout and pt states he will do them as much as he can. Will continue to follow acutely to maximize functional independence and safety with mobility.    Follow Up Recommendations  SNF;Supervision/Assistance - 24 hour     Equipment Recommendations  3in1 (PT)    Recommendations for Other Services       Precautions / Restrictions Precautions Precautions: Fall Precaution Comments: sacral decubitus Restrictions Weight Bearing Restrictions: No    Mobility  Bed Mobility Overal bed mobility: Needs Assistance Bed Mobility: Supine to Sit     Supine to sit: Min assist     General bed mobility comments: assist for trunk mobility due to tremulous  Transfers Overall transfer level: Needs assistance Equipment used: Rolling walker (2 wheeled) Transfers: Sit to/from Stand Sit to Stand: Min assist;+2 physical assistance;From elevated surface Stand pivot transfers: Mod assist;+2 physical assistance       General transfer comment: Good power up. min A +2 for stability from EOB seconday to tremmors. Mod A for SPT to recliner chair. Used manual facilitation at quads to prevent buckling. R LE weaker than L LE. Multimodal cues for sequencing and  technique  Ambulation/Gait                 Stairs             Wheelchair Mobility    Modified Rankin (Stroke Patients Only) Modified Rankin (Stroke Patients Only) Pre-Morbid Rankin Score: No symptoms Modified Rankin: Severe disability     Balance Overall balance assessment: Needs assistance Sitting-balance support: Feet supported;No upper extremity supported Sitting balance-Leahy Scale: Fair Sitting balance - Comments: minguard for sitting EOB   Standing balance support: Bilateral upper extremity supported Standing balance-Leahy Scale: Poor Standing balance comment: bil UE support on therapist or RW with improved standing with bil UE hooking                            Cognition Arousal/Alertness: Awake/alert Behavior During Therapy: WFL for tasks assessed/performed Overall Cognitive Status: Within Functional Limits for tasks assessed                                        Exercises General Exercises - Upper Extremity Shoulder Flexion: Both;AROM;10 reps;Seated General Exercises - Lower Extremity Ankle Circles/Pumps: Both;AROM;10 reps;Seated Short Arc Quad: Both;AROM;10 reps;Seated Heel Slides: Both;AROM;10 reps;Seated Hip ABduction/ADduction: Both;AROM;10 reps;Seated    General Comments General comments (skin integrity, edema, etc.): very pleasent and motivated to work      Pertinent Vitals/Pain Pain Assessment: No/denies pain    Home Living  Prior Function            PT Goals (current goals can now be found in the care plan section) Acute Rehab PT Goals Patient Stated Goal: to be able to walk PT Goal Formulation: With patient Time For Goal Achievement: 07/13/19 Potential to Achieve Goals: Good Progress towards PT goals: Progressing toward goals    Frequency    Min 3X/week      PT Plan Current plan remains appropriate    Co-evaluation              AM-PAC PT "6 Clicks"  Mobility   Outcome Measure  Help needed turning from your back to your side while in a flat bed without using bedrails?: A Little Help needed moving from lying on your back to sitting on the side of a flat bed without using bedrails?: A Little Help needed moving to and from a bed to a chair (including a wheelchair)?: A Little Help needed standing up from a chair using your arms (e.g., wheelchair or bedside chair)?: A Little Help needed to walk in hospital room?: A Lot Help needed climbing 3-5 steps with a railing? : Total 6 Click Score: 15    End of Session Equipment Utilized During Treatment: Gait belt Activity Tolerance: Patient tolerated treatment well Patient left: in chair;with call bell/phone within reach;with chair alarm set Nurse Communication: Mobility status;Need for lift equipment PT Visit Diagnosis: Other abnormalities of gait and mobility (R26.89);Muscle weakness (generalized) (M62.81);Difficulty in walking, not elsewhere classified (R26.2);Pain;Other symptoms and signs involving the nervous system (R29.898) Pain - Right/Left: (sacrum) Pain - part of body: (sacrum)     Time: 7290-2111 PT Time Calculation (min) (ACUTE ONLY): 24 min  Charges:  $Therapeutic Exercise: 8-22 mins $Therapeutic Activity: 8-22 mins                     Benjiman Core, [place] Pager [phone] Acute Rehab   Allena Katz 07/01/2019, 2:19 PM

## 2019-07-01 NOTE — Progress Notes (Signed)
Patient re-examined after 2nd dialysis today. His action myoclonus has not improved after 2 dialysis sessions in a row and is therefore not likely to be metabolic in origin.   Next step would be a trial of scheduled, low-dose Klonopin. This can be initiated as an outpatient. Discussed with patient. All questions answered.   Electronically signed: Dr. Kerney Elbe

## 2019-07-01 NOTE — Progress Notes (Signed)
Called [facility] once again, spoke with the receptionist [place].  She will communicate returning phone call for report on this patient

## 2019-07-01 NOTE — Social Work (Signed)
Clinical Social Worker facilitated patient discharge including contacting patient family and facility to confirm patient discharge plans.  Clinical information faxed to facility and family agreeable with plan.  CSW arranged ambulance transport via [facility] to [place] to call [phone] with report prior to discharge.  Clinical Social Worker will sign off for now as social work intervention is no longer needed. Please consult [place] again if new need arises.  Westley Hummer, MSW, [place] Social Worker

## 2019-07-01 NOTE — Discharge Summary (Signed)
Physician Discharge Summary  [name] OFB:[id number] DOB: 1966/06/05 DOA: 06/27/2019  PCP: [name], MD  Admit date: 06/27/2019 Discharge date: 07/01/2019  Admitted From: [facility]position:  [facility]  Recommendations for Outpatient Follow-up:  1. Follow up with PCP in 1-2 weeks 2. Needs vancomycin with HD through 07/10/19 3. Please obtain BMP/CBC in one week 4. Please follow up on the following pending results:none  Home Health:no Equipment/Devices:none  Discharge Condition:stable CODE STATUS:Full Diet recommendation: Renal  Brief/Interim Summary: Per H and P Dr. Valeta Harms: Troy Wells is a 54 year old male past medical history of CAD, ESRD, HFrEF, hypertension, diabetes, recent prolonged hospitalization 10/28-12/30/20 for Covid-19 pneumonia and ARDS lives at home alone despite being unable to walk. He he noted nonproductive cough and denied any recent chest pain, lower extremity edema, fevers, nausea vomiting, or abdominal pain. He goes to dialysis M/W/F and has not missed any sessions. He is a prior smoker and wears CPAP at night.  He was initially very hypoxic on EMS arrival, improved in the ED with oxygen supplementation and BiPAP. Vitals significant for hypothermia and tachycardia with labs significant for lactic acid of 8.2, BNP greater than 4500, respiratory viral panel negative, VBG pH 7.6, PCO2 35. CT chest was negative for PE, notable for diffuse pulmonary edema with consolidation in the lingula. He was treated with vancomycin and cefepime and PCCM consulted for admission. At the time of exam patient was awake and alert and tolerating BiPAP well with improved shortness of breath.  Hospital course: Admitted to ICU for SOB. Treated for possible pneumonia on admission with cefepime and vancomycin. 3/4 blood cultures on admission positive for coag negative staph. Treating with total of 14 days vancomycin with HD. Completed course for pneumonia on cefepime during  admission. Had worsening of myoclonus and neurology was consulted who recommended 2 HD sessions in a row. He completed the second HD on day of discharge. They are also recommonded trying clonazepam 0.5 mg BID for the myoclonus and felt it was related to the low oxygen levels on admission. Patient was stable on day of discharge and had HD day of discharge.   Discharge Diagnoses:  Principal Problem:   Coagulase negative Staphylococcus bacteremia Active Problems:   Coronary atherosclerosis   OSA treated with BiPAP   Stage 5 chronic kidney disease on chronic dialysis (HCC)   DM (diabetes mellitus), type 2 with renal complications (HCC)   HLD (hyperlipidemia)   Hypertension associated with diabetes ([place])   Diabetic neuropathy ([place])   History of cardiac arrest   Sacral decubitus ulcer, stage IV (HCC)   Myoclonic jerking   Acute hypoxemic respiratory failure [facility])  Discharge Instructions  Discharge Instructions    Call MD for:  difficulty breathing, headache or visual disturbances   Complete by: As directed    Diet renal [id number]   Complete by: As directed    Increase activity slowly   Complete by: As directed      Allergies as of 07/01/2019   No Known Allergies     Medication List    STOP taking these medications   clonazePAM 0.25 MG disintegrating tablet Commonly known as: KLONOPIN Replaced by: clonazePAM 0.5 MG tablet     TAKE these medications   acetaminophen 325 MG tablet Commonly known as: TYLENOL Take 650 mg by mouth every 6 (six) hours as needed for mild pain.   albuterol 108 (90 Base) MCG/ACT inhaler Commonly known as: VENTOLIN HFA Inhale 1-2 puffs into the lungs every 6 (six) hours as needed  for wheezing or shortness of breath.   allopurinol 100 MG tablet Commonly known as: ZYLOPRIM Take 100 mg by mouth 2 (two) times daily.   aspirin 81 MG EC tablet Take 81 mg by mouth daily.   atorvastatin 80 MG tablet Commonly known as: LIPITOR Take 1 tablet (80 mg  total) by mouth daily.   bisacodyl 10 MG suppository Commonly known as: DULCOLAX Place 10 mg rectally daily as needed for moderate constipation (If not relieved by MOM).   carvedilol 6.25 MG tablet Commonly known as: COREG Take 1 tablet (6.25 mg total) by mouth 2 (two) times daily with a meal.   cetirizine 5 MG tablet Commonly known as: ZYRTEC Take 1 tablet (5 mg total) by mouth daily.   clonazePAM 0.5 MG tablet Commonly known as: KLONOPIN Take 1 tablet (0.5 mg total) by mouth 2 (two) times daily. Replaces: clonazePAM 0.25 MG disintegrating tablet   feeding supplement (PRO-STAT SUGAR FREE 64) Liqd Take 30 mLs by mouth 2 (two) times daily.   lidocaine 5 % Commonly known as: LIDODERM Place 1 patch onto the skin daily. Remove & Discard patch within 12 hours or as directed by MD   mirtazapine 15 MG tablet Commonly known as: REMERON Take 1 tablet (15 mg total) by mouth at bedtime.   multivitamin with minerals Tabs tablet Take 1 tablet by mouth daily.   polyethylene glycol 17 g packet Commonly known as: MIRALAX / GLYCOLAX Take 17 g by mouth 2 (two) times daily. What changed:   when to take this  reasons to take this   PRESCRIPTION MEDICATION Pt has CPAP machine   RA SALINE ENEMA RE Place 1 each rectally daily as needed (If no BM after Bisacodyl).   senna 8.6 MG Tabs tablet Commonly known as: SENOKOT Take 1 tablet (8.6 mg total) by mouth daily.   sevelamer carbonate 800 MG tablet Commonly known as: RENVELA Take 800 mg by mouth 3 (three) times daily with meals.   vancomycin 1-5 GM/200ML-% Soln Commonly known as: VANCOCIN Inject 200 mLs (1,000 mg total) into the vein every Monday, Wednesday, and Friday with hemodialysis for 8 days. Start taking on: July 02, 2019       No Known Allergies  Consultations:  Neurology  Nephrology   Procedures/Studies: CTA Chest for PE  Result Date: 06/28/2019 CLINICAL DATA:  Shortness of breath EXAM: CT ANGIOGRAPHY  CHEST WITH CONTRAST TECHNIQUE: Multidetector CT imaging of the chest was performed using the standard protocol during bolus administration of intravenous contrast. Multiplanar CT image reconstructions and MIPs were obtained to evaluate the vascular anatomy. CONTRAST:  30mL OMNIPAQUE IOHEXOL 350 MG/ML SOLN COMPARISON:  None. FINDINGS: Cardiovascular: Contrast injection is sufficient to demonstrate satisfactory opacification of the pulmonary arteries to the segmental level. There is no pulmonary embolus or evidence of right heart strain. The size of the main pulmonary artery is normal. Cardiomegaly with coronary artery calcification. The course and caliber of the aorta are normal. There is mild atherosclerotic calcification. Opacification decreased due to pulmonary arterial phase contrast bolus timing. Mediastinum/Nodes: No mediastinal, hilar or axillary lymphadenopathy. The visualized thyroid and thoracic esophageal course are unremarkable. Lungs/Pleura: There is diffuse pulmonary edema. There is a small area of consolidation in the lingula. No pleural effusion. The airways are patent. Upper Abdomen: Contrast bolus timing is not optimized for evaluation of the abdominal organs. The visualized portions of the organs of the upper abdomen are normal. Musculoskeletal: No chest wall abnormality. No bony spinal canal stenosis. Review of the MIP  images confirms the above findings. IMPRESSION: 1. No pulmonary embolus or acute aortic syndrome. 2. Diffuse pulmonary edema. Small area of consolidation in the lingula, which may indicate pneumonia. 3. Cardiomegaly and coronary artery calcification. Aortic Atherosclerosis (ICD10-I70.0). Electronically Signed   By: Ulyses Jarred M.D.   On: 06/28/2019 00:57   DG Chest Port 1 View  Result Date: 06/27/2019 CLINICAL DATA:  54 year old male COVID-19. Shortness of breath and hypoxia. EXAM: PORTABLE CHEST 1 VIEW COMPARISON:  Portable chest 05/19/2019 and earlier. FINDINGS: Portable AP  upright view at 2237 hours. Cardiomegaly suspected. Other mediastinal contours are within normal limits. Visualized tracheal air column is within normal limits. Left greater than right mixed interstitial and airspace opacity with retrocardiac air bronchograms. The right upper lung is relatively spared. No superimposed pneumothorax or pleural effusion. No acute osseous abnormality identified. IMPRESSION: 1. Left greater than right mixed pulmonary interstitial and airspace opacity compatible with COVID-19 pneumonia in this setting. 2. Cardiomegaly suspected. Electronically Signed   By: Genevie Ann M.D.   On: 06/27/2019 22:49   ECHOCARDIOGRAM COMPLETE  Result Date: 06/28/2019   ECHOCARDIOGRAM REPORT   Patient Name:   Troy Wells Date of Exam: 06/28/2019 Medical Rec #:  [id number]        Height:       71.0 in Accession #:    [id number]       Weight:       222.2 lb Date of Birth:  1966/06/03        BSA:          2.21 m Patient Age:    56 years         BP:           129/106 mmHg Patient Gender: M                HR:           92 bpm. Exam Location:  Inpatient Procedure: 2D Echo and Strain Analysis Indications:    Cardiomyopathy-Ischemic 414.8 / I25.5  History:        Patient has prior history of Echocardiogram examinations, most                 recent 04/27/2019. Previous Myocardial Infarction and CAD,                 Signs/Symptoms:Dyspnea; Risk Factors:Hypertension, Dyslipidemia                 and Sleep Apnea. History of COVID-19. End stage renal disease.                 Anemia.  Sonographer:    Darlina Sicilian RDCS Referring Phys: [id number] [place]  1. Left ventricular ejection fraction, by visual estimation, is 30 to 35%. The left ventricle has normal function. There is moderately increased left ventricular hypertrophy.  2. Basal inferolateral segment, basal anterolateral segment, and basal inferior segment are abnormal.  3. Left ventricular diastolic parameters are consistent with Grade I  diastolic dysfunction (impaired relaxation).  4. Mildly dilated left ventricular internal cavity size.  5. The left ventricle demonstrates global hypokinesis.  6. Global right ventricle has normal systolic function.The right ventricular size is normal. No increase in right ventricular wall thickness.  7. Left atrial size was moderately dilated.  8. Right atrial size was normal.  9. The mitral valve is normal in structure. Mild mitral valve regurgitation. No evidence of mitral stenosis. 10. The tricuspid valve is normal in structure. 11.  The aortic valve is normal in structure. Aortic valve regurgitation is not visualized. No evidence of aortic valve sclerosis or stenosis. 12. The pulmonic valve was normal in structure. Pulmonic valve regurgitation is not visualized. 13. The inferior vena cava is normal in size with greater than 50% respiratory variability, suggesting right atrial pressure of 3 mmHg. 14. The average left ventricular global longitudinal strain is -9.1 %. In comparison to the previous echocardiogram(s): EF is reduced when compared to prior echocardiogram. FINDINGS  Left Ventricle: Left ventricular ejection fraction, by visual estimation, is 30 to 35%. The left ventricle has normal function. The average left ventricular global longitudinal strain is -9.1 %. The left ventricle demonstrates global hypokinesis. The left ventricular internal cavity size was mildly dilated left ventricle. There is moderately increased left ventricular hypertrophy. Left ventricular diastolic parameters are consistent with Grade I diastolic dysfunction (impaired relaxation). Normal left atrial pressure.  LV Wall Scoring: The basal inferolateral segment, basal anterolateral segment, and basal inferior segment are akinetic. Right Ventricle: The right ventricular size is normal. No increase in right ventricular wall thickness. Global RV systolic function is has normal systolic function. Left Atrium: Left atrial size was  moderately dilated. Right Atrium: Right atrial size was normal in size Pericardium: There is no evidence of pericardial effusion. Mitral Valve: The mitral valve is normal in structure. Mild mitral valve regurgitation. No evidence of mitral valve stenosis by observation. Tricuspid Valve: The tricuspid valve is normal in structure. Tricuspid valve regurgitation is not demonstrated. Aortic Valve: The aortic valve is normal in structure. Aortic valve regurgitation is not visualized. The aortic valve is structurally normal, with no evidence of sclerosis or stenosis. Pulmonic Valve: The pulmonic valve was normal in structure. Pulmonic valve regurgitation is not visualized. Pulmonic regurgitation is not visualized. Aorta: The aortic root, ascending aorta and aortic arch are all structurally normal, with no evidence of dilitation or obstruction. Venous: The inferior vena cava is normal in size with greater than 50% respiratory variability, suggesting right atrial pressure of 3 mmHg. IAS/Shunts: No atrial level shunt detected by color flow Doppler. There is no evidence of a patent foramen ovale. No ventricular septal defect is seen or detected. There is no evidence of an atrial septal defect.  LEFT VENTRICLE PLAX 2D LVIDd:         5.80 cm       Diastology LVIDs:         4.90 cm       LV e' lateral:   4.68 cm/s LV PW:         1.10 cm       LV E/e' lateral: 21.4 LV IVS:        1.60 cm       LV e' medial:    6.96 cm/s LVOT diam:     2.20 cm       LV E/e' medial:  14.4 LV SV:         54 ml LV SV Index:   23.64         2D Longitudinal Strain LVOT Area:     3.80 cm      2D Strain GLS Avg:     -9.1 %  LV Volumes (MOD) LV area d, A2C:    68.00 cm 3D Volume EF: LV area d, A4C:    61.80 cm 3D EF:        36 % LV area s, A2C:    44.30 cm LV EDV:  317 ml LV area s, A4C:    51.10 cm LV ESV:       204 ml LV major d, A2C:   11.90 cm  LV SV:        113 ml LV major d, A4C:   11.30 cm LV major s, A2C:   10.30 cm LV major s, A4C:    11.00 cm LV vol d, MOD A2C: 330.0 ml LV vol d, MOD A4C: 282.0 ml LV vol s, MOD A2C: 172.0 ml LV vol s, MOD A4C: 207.0 ml LV SV MOD A2C:     158.0 ml LV SV MOD A4C:     282.0 ml LV SV MOD BP:      117.2 ml RIGHT VENTRICLE RV S prime:     14.50 cm/s TAPSE (M-mode): 2.1 cm LEFT ATRIUM              Index       RIGHT ATRIUM           Index LA diam:        4.60 cm  2.09 cm/m  RA Area:     20.00 cm LA Vol (A2C):   100.0 ml 45.35 ml/m RA Volume:   56.70 ml  25.71 ml/m LA Vol (A4C):   96.1 ml  43.58 ml/m LA Biplane Vol: 99.1 ml  44.94 ml/m  AORTIC VALVE LVOT Vmax:   120.00 cm/s LVOT Vmean:  83.900 cm/s LVOT VTI:    0.201 m  AORTA Ao Root diam: 3.40 cm MITRAL VALVE MV Area (PHT): 4.80 cm              SHUNTS MV PHT:        45.82 msec            Systemic VTI:  0.20 m MV Decel Time: 158 msec              Systemic Diam: 2.20 cm MV E velocity: 100.00 cm/s 103 cm/s MV A velocity: 116.00 cm/s 70.3 cm/s MV E/A ratio:  0.86        1.5  Candee Furbish MD Electronically signed by Candee Furbish MD Signature Date/Time: 06/28/2019/12:55:04 PM    Final    ECHOCARDIOGRAM LIMITED  Result Date: 06/30/2019   ECHOCARDIOGRAM LIMITED REPORT   Patient Name:   Troy Wells Date of Exam: 06/30/2019 Medical Rec #:  [id number]        Height:       71.0 in Accession #:    [id number]       Weight:       218.7 lb Date of Birth:  [birth year]        BSA:          2.19 m Patient Age:    39 years         BP:           140/91 mmHg Patient Gender: M                HR:           80 bpm. Exam Location:  Inpatient  Procedure: Limited Echo Indications:    Endocarditis I38  History:        Patient has prior history of Echocardiogram examinations, most                 recent 06/28/2019. History of Covid positive10/2020, Coronary                 atherosclerosis, PHTN.  Sonographer:    Leavy Cella Referring Phys: [id number] [place]  1. Left ventricular ejection fraction, by visual estimation, is 30%. The left ventricle has moderate to severely  decreased function. There is mildly increased left ventricular wall thickness.  2. Left ventricular diastolic parameters are consistent with Grade I diastolic dysfunction (impaired relaxation).  3. Severely dilated left ventricular internal cavity size.  4. The left ventricle demonstrates regional wall motion abnormalities. Inferolateral akinesis, severe inferior hypokinesis, severe anterolateral hypokinesis.  5. Global right ventricle has normal systolc function.The right ventricular size is normal. no increase in right ventricular wall thickness.  6. Mild mitral annular calcification.  7. The mitral valve is normal in structure. Mild mitral valve regurgitation. No evidence of mitral stenosis.  8. No evidence of aortic valve sclerosis or stenosis.  9. TR signal is inadequate for assessing pulmonary artery systolic pressure. 10. Tricuspid valve regurgitation is not demonstrated. 11. The IVC was not visualized. FINDINGS  Left Ventricle: Left ventricular ejection fraction, by visual estimation, is 30%. The left ventricle has moderate to severely decreased function. The left ventricle demonstrates regional wall motion abnormalities. The left ventricular internal cavity size was severely dilated left ventricle. There is mildly increased left ventricular wall thickness. Left ventricular diastolic parameters are consistent with Grade I diastolic dysfunction (impaired relaxation). Right Ventricle: The right ventricular size is normal. No increase in right ventricular wall thickness. Global RV systolic function is has normal systolic function. Left Atrium: Left atrial size was normal in size. Right Atrium: Right atrial size was normal in size. Pericardium: There is no evidence of pericardial effusion is seen. There is no evidence of pericardial effusion. Mitral Valve: The mitral valve is normal in structure. There is mild calcification of the mitral valve leaflet(s). Mild mitral annular calcification. No evidence of mitral  valve stenosis by observation. Mild mitral valve regurgitation. Tricuspid Valve: The tricuspid valve is normal in structure. Tricuspid valve regurgitation is not demonstrated. Aortic Valve: The aortic valve is tricuspid. Aortic valve regurgitation is not visualized. The aortic valve is structurally normal, with no evidence of sclerosis or stenosis. Pulmonic Valve: The pulmonic valve was normal in structure. Pulmonic valve regurgitation is not visualized by color flow Doppler. Pulmonic regurgitation is not visualized by color flow Doppler. Venous: The inferior vena cava was not well visualized. Shunts: No atrial level shunt detected by color flow Doppler.  Loralie Champagne MD Electronically signed by Loralie Champagne MD Signature Date/Time: 06/30/2019/3:50:37 PMThe mitral valve is normal in structure.    Final     Subjective: Doing well this morning, still with the jerking. More with activity and less at rest. Denies chest pains or SOB. Denies cough. Denies abdominal pain or nausea or vomiting. Denies diarrhea. Overall doing good. Slept okay and appetite good.    Discharge Exam: Vitals:   06/30/19 2058 07/01/19 0422  BP: (!) 146/97 (!) 136/91  Pulse: 90 84  Resp:  16  Temp: 99.3 F (37.4 C) 97.9 F (36.6 C)  SpO2: 98% 99%   Vitals:   06/30/19 1630 06/30/19 2058 07/01/19 0422 07/01/19 0600  BP: (!) 136/91 (!) 146/97 (!) 136/91   Pulse: 88 90 84   Resp: 18  16   Temp: 98.1 F (36.7 C) 99.3 F (37.4 C) 97.9 F (36.6 C)   TempSrc: Oral Oral Oral   SpO2: 100% 98% 99%   Weight:    94.4 kg  Height:        General: Pt is alert, awake, not in  acute distress Cardiovascular: RRR, S1/S2 +, no rubs, no gallops Respiratory: CTA bilaterally, no wheezing, no rhonchi Abdominal: Soft, NT, ND, bowel sounds + Extremities: no edema, no cyanosis, jerking myoclonus of the upper and lower extremities  The results of significant diagnostics from this hospitalization (including imaging, microbiology, ancillary  and laboratory) are listed below for reference.     Microbiology: Recent Results (from the past 240 hour(s))  Blood Culture (routine x 2)     Status: Abnormal (Preliminary result)   Collection Time: 06/27/19 10:24 PM   Specimen: BLOOD RIGHT HAND  Result Value Ref Range Status   Specimen Description BLOOD RIGHT HAND  Final   Special Requests AEROBIC BOTTLE ONLY Blood Culture adequate volume  Final   Culture  Setup Time   Final    GRAM POSITIVE COCCI AEROBIC BOTTLE ONLY CRITICAL VALUE NOTED.  VALUE IS CONSISTENT WITH PREVIOUSLY REPORTED AND CALLED VALUE.    Culture (A)  Final    STAPHYLOCOCCUS EPIDERMIDIS SUSCEPTIBILITIES TO FOLLOW Performed at [facility], [place] [address], [place], [place] [address]    Report Status PENDING  Incomplete  Blood Culture ID Panel (Reflexed)     Status: Abnormal   Collection Time: 06/27/19 10:24 PM  Result Value Ref Range Status   Enterococcus species NOT DETECTED NOT DETECTED Final   Listeria monocytogenes NOT DETECTED NOT DETECTED Final   Staphylococcus species DETECTED (A) NOT DETECTED Final    Comment: Methicillin (oxacillin) resistant coagulase negative staphylococcus. Possible blood culture contaminant (unless isolated from more than one blood culture draw or clinical case suggests pathogenicity). No antibiotic treatment is indicated for blood  culture contaminants. CRITICAL RESULT CALLED TO, READ BACK BY AND VERIFIED WITH: Bronwen Betters PharmD 16:30 06/29/19 (wilsonm)    Staphylococcus aureus (BCID) NOT DETECTED NOT DETECTED Final   Methicillin resistance DETECTED (A) NOT DETECTED Final    Comment: CRITICAL RESULT CALLED TO, READ BACK BY AND VERIFIED WITH: Bronwen Betters PharmD 16:30 06/29/19 (wilsonm)    Streptococcus species NOT DETECTED NOT DETECTED Final   Streptococcus agalactiae NOT DETECTED NOT DETECTED Final   Streptococcus pneumoniae NOT DETECTED NOT DETECTED Final   Streptococcus pyogenes NOT DETECTED NOT DETECTED Final   Acinetobacter  baumannii NOT DETECTED NOT DETECTED Final   Enterobacteriaceae species NOT DETECTED NOT DETECTED Final   Enterobacter cloacae complex NOT DETECTED NOT DETECTED Final   Escherichia coli NOT DETECTED NOT DETECTED Final   Klebsiella oxytoca NOT DETECTED NOT DETECTED Final   Klebsiella pneumoniae NOT DETECTED NOT DETECTED Final   Proteus species NOT DETECTED NOT DETECTED Final   Serratia marcescens NOT DETECTED NOT DETECTED Final   Haemophilus influenzae NOT DETECTED NOT DETECTED Final   Neisseria meningitidis NOT DETECTED NOT DETECTED Final   Pseudomonas aeruginosa NOT DETECTED NOT DETECTED Final   Candida albicans NOT DETECTED NOT DETECTED Final   Candida glabrata NOT DETECTED NOT DETECTED Final   Candida krusei NOT DETECTED NOT DETECTED Final   Candida parapsilosis NOT DETECTED NOT DETECTED Final   Candida tropicalis NOT DETECTED NOT DETECTED Final    Comment: Performed at [facility], [place]. [address], [place], [place] [address]  Blood Culture (routine x 2)     Status: Abnormal (Preliminary result)   Collection Time: 06/27/19 10:25 PM   Specimen: BLOOD RIGHT HAND  Result Value Ref Range Status   Specimen Description BLOOD RIGHT HAND  Final   Special Requests   Final    BOTTLES DRAWN AEROBIC AND ANAEROBIC Blood Culture adequate volume  Culture  Setup Time   Final    GRAM POSITIVE COCCI IN BOTH AEROBIC AND ANAEROBIC BOTTLES CRITICAL RESULT CALLED TO, READ BACK BY AND VERIFIED WITH: PHARMD V BRYK [id number] AT 53 AM BY CM    Culture (A)  Final    STAPHYLOCOCCUS EPIDERMIDIS REPEATING SUSCEPTIBILITIES Performed at [facility], [place] [address] [place], [place] [address]    Report Status PENDING  Incomplete  Respiratory Panel by RT PCR (Flu A&B, Covid) - Nasopharyngeal Swab     Status: None   Collection Time: 06/27/19 11:52 PM   Specimen: Nasopharyngeal Swab  Result Value Ref Range Status   SARS Coronavirus 2 by RT PCR NEGATIVE NEGATIVE Final    Comment: (NOTE) SARS-CoV-2  target nucleic acids are NOT DETECTED. The SARS-CoV-2 RNA is generally detectable in upper respiratoy specimens during the acute phase of infection. The lowest concentration of SARS-CoV-2 viral copies this assay can detect is 131 copies/mL. A negative result does not preclude SARS-Cov-2 infection and should not be used as the sole basis for treatment or other patient management decisions. A negative result may occur with  improper specimen collection/handling, submission of specimen other than nasopharyngeal swab, presence of viral mutation(s) within the areas targeted by this assay, and inadequate number of viral copies (<131 copies/mL). A negative result must be combined with clinical observations, patient history, and epidemiological information. The expected result is Negative. Fact Sheet for Patients:  PinkCheek.be Fact Sheet for Healthcare Providers:  GravelBags.it This test is not yet ap proved or cleared by the [place] FDA and  has been authorized for detection and/or diagnosis of SARS-CoV-2 by FDA under an Emergency Use Authorization (EUA). This EUA will remain  in effect (meaning this test can be used) for the duration of the COVID-19 declaration under Section 564(b)(1) of the Act, 21 U.S.C. section 360bbb-3(b)(1), unless the authorization is terminated or revoked sooner.    Influenza A by PCR NEGATIVE NEGATIVE Final   Influenza B by PCR NEGATIVE NEGATIVE Final    Comment: (NOTE) The Xpert Xpress SARS-CoV-2/FLU/RSV assay is intended as an aid in  the diagnosis of influenza from Nasopharyngeal swab specimens and  should not be used as a sole basis for treatment. Nasal washings and  aspirates are unacceptable for Xpert Xpress SARS-CoV-2/FLU/RSV  testing. Fact Sheet for Patients: PinkCheek.be Fact Sheet for Healthcare Providers: GravelBags.it This test is  not yet approved or cleared by the [place] FDA and  has been authorized for detection and/or diagnosis of SARS-CoV-2 by  FDA under an Emergency Use Authorization (EUA). This EUA will remain  in effect (meaning this test can be used) for the duration of the  Covid-19 declaration under Section 564(b)(1) of the Act, 21  U.S.C. section 360bbb-3(b)(1), unless the authorization is  terminated or revoked. Performed at [facility], [place] [address], [place], [place] [address]   MRSA PCR Screening     Status: None   Collection Time: 06/28/19  4:57 AM   Specimen: Nasal Mucosa; Nasopharyngeal  Result Value Ref Range Status   MRSA by PCR NEGATIVE NEGATIVE Final    Comment:        The GeneXpert MRSA Assay (FDA approved for NASAL specimens only), is one component of a comprehensive MRSA colonization surveillance program. It is not intended to diagnose MRSA infection nor to guide or monitor treatment for MRSA infections. Performed at [facility], [place] [address] [place], [place] [address]   Culture, blood (routine x 2)  Status: None (Preliminary result)   Collection Time: 06/29/19  6:19 PM   Specimen: BLOOD  Result Value Ref Range Status   Specimen Description BLOOD RIGHT ANTECUBITAL  Final   Special Requests   Final    BOTTLES DRAWN AEROBIC AND ANAEROBIC Blood Culture adequate volume   Culture   Final    NO GROWTH 2 DAYS Performed at [facility], [address], [place], [place] [address]    Report Status PENDING  Incomplete  Culture, blood (Routine X 2) w Reflex to ID Panel     Status: None (Preliminary result)   Collection Time: 06/29/19  6:25 PM   Specimen: BLOOD RIGHT HAND  Result Value Ref Range Status   Specimen Description BLOOD RIGHT HAND  Final   Special Requests   Final    BOTTLES DRAWN AEROBIC AND ANAEROBIC Blood Culture adequate volume   Culture   Final    NO GROWTH 2 DAYS Performed at [facility], [place] [address] [place], [place]  [address]    Report Status PENDING  Incomplete  SARS CORONAVIRUS 2 (TAT 6-24 HRS) Nasopharyngeal Nasopharyngeal Swab     Status: None   Collection Time: 06/30/19  1:36 PM   Specimen: Nasopharyngeal Swab  Result Value Ref Range Status   SARS Coronavirus 2 NEGATIVE NEGATIVE Final    Comment: (NOTE) SARS-CoV-2 target nucleic acids are NOT DETECTED. The SARS-CoV-2 RNA is generally detectable in upper and lower respiratory specimens during the acute phase of infection. Negative results do not preclude SARS-CoV-2 infection, do not rule out co-infections with other pathogens, and should not be used as the sole basis for treatment or other patient management decisions. Negative results must be combined with clinical observations, patient history, and epidemiological information. The expected result is Negative. Fact Sheet for Patients: SugarRoll.be Fact Sheet for Healthcare Providers: https://www.woods-mathews.com/ This test is not yet approved or cleared by the [place] FDA and  has been authorized for detection and/or diagnosis of SARS-CoV-2 by FDA under an Emergency Use Authorization (EUA). This EUA will remain  in effect (meaning this test can be used) for the duration of the COVID-19 declaration under Section 56 4(b)(1) of the Act, 21 U.S.C. section 360bbb-3(b)(1), unless the authorization is terminated or revoked sooner. Performed at [facility], [place] [address] [place], [place] [address]      Labs: BNP (last 3 results) Recent Labs    04/27/19 1659 04/28/19 0622 06/27/19 2228  BNP 1,048.8* 1,289.0* >9,937.1*   Basic Metabolic Panel: Recent Labs  Lab 06/27/19 2224 06/27/19 2224 06/28/19 0020 06/28/19 0439 06/29/19 0210 06/29/19 1117 06/30/19 1235  NA 140   < > 138 139 140 140 138  K 3.9   < > 3.8 4.2 3.9 4.1 3.8  CL 94*  --   --  94* 99 98 96*  CO2 28  --   --  31 27 28 26   GLUCOSE 213*  --   --  120* 110* 137* 104*   BUN 28*  --   --  30* 26* 29* 39*  CREATININE 10.37*  --   --  10.50* 8.12* 9.22* 11.38*  CALCIUM 10.2  --   --  10.1 10.2 10.7* 10.5*  MG  --   --   --  2.0  --   --   --   PHOS  --   --   --  5.2*  --  4.5 4.6   < > = values in  this interval not displayed.   Liver Function Tests: Recent Labs  Lab 06/27/19 2224 06/29/19 1117 06/30/19 1001 06/30/19 1235  AST 28  --  20  --   ALT 24  --  21  --   ALKPHOS 88  --  77  --   BILITOT 0.9  --  0.9  --   PROT 6.6  --  6.5  --   ALBUMIN 3.1* 2.9* 3.0* 2.9*   No results for input(s): LIPASE, AMYLASE in the last 168 hours. Recent Labs  Lab 06/30/19 1001  AMMONIA 19   CBC: Recent Labs  Lab 06/27/19 2224 06/27/19 2224 06/28/19 0020 06/28/19 0439 06/29/19 0210 06/30/19 1235 07/01/19 0217  WBC 10.2  --   --  8.8 6.9 7.0 6.0  NEUTROABS 4.0  --   --   --   --   --   --   HGB 10.5*   < > 10.2* 9.0* 9.9* 9.9* 10.0*  HCT 35.9*   < > 30.0* 29.1* 31.8* 30.5* 31.9*  MCV 96.8  --   --  90.1 89.6 88.2 88.4  PLT 316  --   --  326 310 317 307   < > = values in this interval not displayed.   Cardiac Enzymes: No results for input(s): CKTOTAL, CKMB, CKMBINDEX, TROPONINI in the last 168 hours. BNP: Invalid input(s): POCBNP CBG: Recent Labs  Lab 06/30/19 1632 06/30/19 2100 07/01/19 0105 07/01/19 0425 07/01/19 0759  GLUCAP 89 130* 97 105* 111*   D-Dimer No results for input(s): DDIMER in the last 72 hours. Hgb A1c No results for input(s): HGBA1C in the last 72 hours. Lipid Profile No results for input(s): CHOL, HDL, LDLCALC, TRIG, CHOLHDL, LDLDIRECT in the last 72 hours. Thyroid function studies No results for input(s): TSH, T4TOTAL, T3FREE, THYROIDAB in the last 72 hours.  Invalid input(s): FREET3 Anemia work up No results for input(s): VITAMINB12, FOLATE, FERRITIN, TIBC, IRON, RETICCTPCT in the last 72 hours. Urinalysis    Component Value Date/Time   COLORURINE YELLOW 09/17/2009 0047   APPEARANCEUR CLOUDY (A) 09/17/2009  0047   LABSPEC 1.016 09/17/2009 0047   PHURINE 5.5 09/17/2009 0047   GLUCOSEU NEGATIVE 09/17/2009 0047   HGBUR MODERATE (A) 09/17/2009 0047   BILIRUBINUR NEGATIVE 09/17/2009 0047   KETONESUR NEGATIVE 09/17/2009 0047   PROTEINUR 100 (A) 09/17/2009 0047   UROBILINOGEN 1.0 09/17/2009 0047   NITRITE NEGATIVE 09/17/2009 0047   LEUKOCYTESUR MODERATE (A) 09/17/2009 0047   Sepsis Labs Invalid input(s): PROCALCITONIN,  WBC,  LACTICIDVEN Microbiology Recent Results (from the past 240 hour(s))  Blood Culture (routine x 2)     Status: Abnormal (Preliminary result)   Collection Time: 06/27/19 10:24 PM   Specimen: BLOOD RIGHT HAND  Result Value Ref Range Status   Specimen Description BLOOD RIGHT HAND  Final   Special Requests AEROBIC BOTTLE ONLY Blood Culture adequate volume  Final   Culture  Setup Time   Final    GRAM POSITIVE COCCI AEROBIC BOTTLE ONLY CRITICAL VALUE NOTED.  VALUE IS CONSISTENT WITH PREVIOUSLY REPORTED AND CALLED VALUE.    Culture (A)  Final    STAPHYLOCOCCUS EPIDERMIDIS SUSCEPTIBILITIES TO FOLLOW Performed at [facility], [place] [address], [place], [place] [address]    Report Status PENDING  Incomplete  Blood Culture ID Panel (Reflexed)     Status: Abnormal   Collection Time: 06/27/19 10:24 PM  Result Value Ref Range Status   Enterococcus species NOT DETECTED NOT DETECTED Final   Listeria  monocytogenes NOT DETECTED NOT DETECTED Final   Staphylococcus species DETECTED (A) NOT DETECTED Final    Comment: Methicillin (oxacillin) resistant coagulase negative staphylococcus. Possible blood culture contaminant (unless isolated from more than one blood culture draw or clinical case suggests pathogenicity). No antibiotic treatment is indicated for blood  culture contaminants. CRITICAL RESULT CALLED TO, READ BACK BY AND VERIFIED WITH: Bronwen Betters PharmD 16:30 06/29/19 ([email or web address])    Staphylococcus aureus (BCID) NOT DETECTED NOT DETECTED Final   Methicillin resistance DETECTED  (A) NOT DETECTED Final    Comment: CRITICAL RESULT CALLED TO, READ BACK BY AND VERIFIED WITH: Bronwen Betters PharmD 16:30 06/29/19 ([email or web address])    Streptococcus species NOT DETECTED NOT DETECTED Final   Streptococcus agalactiae NOT DETECTED NOT DETECTED Final   Streptococcus pneumoniae NOT DETECTED NOT DETECTED Final   Streptococcus pyogenes NOT DETECTED NOT DETECTED Final   Acinetobacter baumannii NOT DETECTED NOT DETECTED Final   Enterobacteriaceae species NOT DETECTED NOT DETECTED Final   Enterobacter cloacae complex NOT DETECTED NOT DETECTED Final   Escherichia coli NOT DETECTED NOT DETECTED Final   Klebsiella oxytoca NOT DETECTED NOT DETECTED Final   Klebsiella pneumoniae NOT DETECTED NOT DETECTED Final   Proteus species NOT DETECTED NOT DETECTED Final   Serratia marcescens NOT DETECTED NOT DETECTED Final   Haemophilus influenzae NOT DETECTED NOT DETECTED Final   Neisseria meningitidis NOT DETECTED NOT DETECTED Final   Pseudomonas aeruginosa NOT DETECTED NOT DETECTED Final   Candida albicans NOT DETECTED NOT DETECTED Final   Candida glabrata NOT DETECTED NOT DETECTED Final   Candida krusei NOT DETECTED NOT DETECTED Final   Candida parapsilosis NOT DETECTED NOT DETECTED Final   Candida tropicalis NOT DETECTED NOT DETECTED Final    Comment: Performed at [facility], [place], [place], [place] [address]  Blood Culture (routine x 2)     Status: Abnormal (Preliminary result)   Collection Time: 06/27/19 10:25 PM   Specimen: BLOOD RIGHT HAND  Result Value Ref Range Status   Specimen Description BLOOD RIGHT HAND  Final   Special Requests   Final    BOTTLES DRAWN AEROBIC AND ANAEROBIC Blood Culture adequate volume   Culture  Setup Time   Final    GRAM POSITIVE COCCI IN BOTH AEROBIC AND ANAEROBIC BOTTLES CRITICAL RESULT CALLED TO, READ BACK BY AND VERIFIED WITH: PHARMD V BRYK [id number] AT 46 AM BY CM    Culture (A)  Final    STAPHYLOCOCCUS EPIDERMIDIS REPEATING  SUSCEPTIBILITIES Performed at [facility], [place] [address], [place], [place] [address]    Report Status PENDING  Incomplete  Respiratory Panel by RT PCR (Flu A&B, Covid) - Nasopharyngeal Swab     Status: None   Collection Time: 06/27/19 11:52 PM   Specimen: Nasopharyngeal Swab  Result Value Ref Range Status   SARS Coronavirus 2 by RT PCR NEGATIVE NEGATIVE Final    Comment: (NOTE) SARS-CoV-2 target nucleic acids are NOT DETECTED. The SARS-CoV-2 RNA is generally detectable in upper respiratoy specimens during the acute phase of infection. The lowest concentration of SARS-CoV-2 viral copies this assay can detect is 131 copies/mL. A negative result does not preclude SARS-Cov-2 infection and should not be used as the sole basis for treatment or other patient management decisions. A negative result may occur with  improper specimen collection/handling, submission of specimen other than nasopharyngeal swab, presence of viral mutation(s) within the areas targeted by this assay, and inadequate number of viral copies (<131 copies/mL). A negative result must be  combined with clinical observations, patient history, and epidemiological information. The expected result is Negative. Fact Sheet for Patients:  PinkCheek.be Fact Sheet for Healthcare Providers:  GravelBags.it This test is not yet ap proved or cleared by the [place] FDA and  has been authorized for detection and/or diagnosis of SARS-CoV-2 by FDA under an Emergency Use Authorization (EUA). This EUA will remain  in effect (meaning this test can be used) for the duration of the COVID-19 declaration under Section 564(b)(1) of the Act, 21 U.S.C. section 360bbb-3(b)(1), unless the authorization is terminated or revoked sooner.    Influenza A by PCR NEGATIVE NEGATIVE Final   Influenza B by PCR NEGATIVE NEGATIVE Final    Comment: (NOTE) The Xpert Xpress SARS-CoV-2/FLU/RSV  assay is intended as an aid in  the diagnosis of influenza from Nasopharyngeal swab specimens and  should not be used as a sole basis for treatment. Nasal washings and  aspirates are unacceptable for Xpert Xpress SARS-CoV-2/FLU/RSV  testing. Fact Sheet for Patients: PinkCheek.be Fact Sheet for Healthcare Providers: GravelBags.it This test is not yet approved or cleared by the [place] FDA and  has been authorized for detection and/or diagnosis of SARS-CoV-2 by  FDA under an Emergency Use Authorization (EUA). This EUA will remain  in effect (meaning this test can be used) for the duration of the  Covid-19 declaration under Section 564(b)(1) of the Act, 21  U.S.C. section 360bbb-3(b)(1), unless the authorization is  terminated or revoked. Performed at [facility], [place] [address], [place], [place] [address]   MRSA PCR Screening     Status: None   Collection Time: 06/28/19  4:57 AM   Specimen: Nasal Mucosa; Nasopharyngeal  Result Value Ref Range Status   MRSA by PCR NEGATIVE NEGATIVE Final    Comment:        The GeneXpert MRSA Assay (FDA approved for NASAL specimens only), is one component of a comprehensive MRSA colonization surveillance program. It is not intended to diagnose MRSA infection nor to guide or monitor treatment for MRSA infections. Performed at [facility], [place] [address] [place], [place] [address]   Culture, blood (routine x 2)     Status: None (Preliminary result)   Collection Time: 06/29/19  6:19 PM   Specimen: BLOOD  Result Value Ref Range Status   Specimen Description BLOOD RIGHT ANTECUBITAL  Final   Special Requests   Final    BOTTLES DRAWN AEROBIC AND ANAEROBIC Blood Culture adequate volume   Culture   Final    NO GROWTH 2 DAYS Performed at [facility], [place] [address], [place], [place] [address]    Report Status PENDING  Incomplete  Culture, blood (Routine X 2) w  Reflex to ID Panel     Status: None (Preliminary result)   Collection Time: 06/29/19  6:25 PM   Specimen: BLOOD RIGHT HAND  Result Value Ref Range Status   Specimen Description BLOOD RIGHT HAND  Final   Special Requests   Final    BOTTLES DRAWN AEROBIC AND ANAEROBIC Blood Culture adequate volume   Culture   Final    NO GROWTH 2 DAYS Performed at [facility], [place] [address], [place], [place] [address]    Report Status PENDING  Incomplete  SARS CORONAVIRUS 2 (TAT 6-24 HRS) Nasopharyngeal Nasopharyngeal Swab     Status: None   Collection Time: 06/30/19  1:36 PM   Specimen: Nasopharyngeal Swab  Result Value Ref Range Status   SARS  Coronavirus 2 NEGATIVE NEGATIVE Final    Comment: (NOTE) SARS-CoV-2 target nucleic acids are NOT DETECTED. The SARS-CoV-2 RNA is generally detectable in upper and lower respiratory specimens during the acute phase of infection. Negative results do not preclude SARS-CoV-2 infection, do not rule out co-infections with other pathogens, and should not be used as the sole basis for treatment or other patient management decisions. Negative results must be combined with clinical observations, patient history, and epidemiological information. The expected result is Negative. Fact Sheet for Patients: SugarRoll.be Fact Sheet for Healthcare Providers: https://www.woods-mathews.com/ This test is not yet approved or cleared by the [place] FDA and  has been authorized for detection and/or diagnosis of SARS-CoV-2 by FDA under an Emergency Use Authorization (EUA). This EUA will remain  in effect (meaning this test can be used) for the duration of the COVID-19 declaration under Section 56 4(b)(1) of the Act, 21 U.S.C. section 360bbb-3(b)(1), unless the authorization is terminated or revoked sooner. Performed at [facility], [place] [address], [place], [place] [address]    Time coordinating discharge: Over 30  minutes  SIGNED:  Hoyt Koch, MD  [facility] 07/01/2019, 12:17 PM Pager   If 7PM-7AM, please contact night-coverage [email or web address] Password [email or web address]

## 2019-07-01 NOTE — Progress Notes (Signed)
The patient and his daughter are aware of discharge to [facility] this evening.  PTAR has been scheduled to pick the patient up around 1700.  The patient is alert and oriented.  No complaints voiced.  The patient is eating with his daughter and awaits for transportation

## 2019-07-01 NOTE — Progress Notes (Addendum)
  [facility] Progress Note   Subjective: had HD yest, will plan extra HD today per neuro rec's. Pt is pleasant w/o complaints.   Objective Vitals:   06/30/19 1630 06/30/19 2058 07/01/19 0422 07/01/19 0600  BP: (!) 136/91 (!) 146/97 (!) 136/91   Pulse: 88 90 84   Resp: 18  16   Temp: 98.1 F (36.7 C) 99.3 F (37.4 C) 97.9 F (36.6 C)   TempSrc: Oral Oral Oral   SpO2: 100% 98% 99%   Weight:    94.4 kg  Height:       Physical Exam General: Pleasant, WN,WD male in NAD Heart: S1,S2 RRR Lungs: CTAB at present. No WOB.  Abdomen: S,NT active BS Extremities: No Le edema.  Neuro: mild - mod jerking at rest. A & O X 3.  Skin: Warm dry no cyanosis, has sacral wound with drsg intact. Dialysis Access: L AVF + bruit  Medications: . vancomycin Stopped (06/30/19 1515)   . allopurinol  100 mg Oral BID  . aspirin  81 mg Oral Daily  . atorvastatin  80 mg Oral Daily  . carvedilol  6.25 mg Oral BID WC  . Chlorhexidine Gluconate Cloth  6 each Topical Q0600  . clonazePAM  0.5 mg Oral BID  . ferric citrate  420 mg Oral TID WC  . heparin  5,000 Units Subcutaneous BID  . insulin aspart  0-6 Units Subcutaneous Q4H  . mouth rinse  15 mL Mouth Rinse BID     [facility] 4.5h  F200  450/800  99kg  2/2 bath  AVF  Hep 6000+2545midrun -Mircera 200 mcg IV q 2 weeks (last dose 06/19/2019)  Background: 54 Y/O male S/P COVID 19 presented with pulmonary edema, volume overload, HCAP.   Assessment/Plan: 1. Pulmonary Edema: No missed treatments, was getting to OP EDW. Resolved with HD w/ 5 L off, another 2-3 off yest. 5kg below edw now. Resp status back to baseline 2. Coag neg staph bacteremia - 3/3 + blood cx's. No TDC, AVF does not look infected. ID consulting.  3. Jerking movements/myoclonus: seen again by neuro yest, appreciate assistance. Had same thing here in December. Per neuro recommending extra HD session to see if this helps, plan extra HD today.  3. ESRD -MWF, extra HD  today. HD 3h yest and today and tomorrow. 4. Anemia -HGB 9.9 recent ESA dose as OP. Follow HGB.  5. Secondary hyperparathyroidism - Ca 10.1 C Ca 10.8 Phos OK. Use 2.0 Ca bath, continue Auryxia binders. No VDRA.  6. HTN/volume - as above 7. Nutrition -Renal/Carb mod diet, nepro, renal vit 8. DM-per primary 9. Sacral Decub-per primary  Kelly Splinter, MD 07/01/2019, 8:56 AM

## 2019-07-01 NOTE — Progress Notes (Signed)
Spoke with Carmelina Paddock again in regards to giving report.  Every time the phone call is transferred there is not an answer.  This time, she is walking the phone to the nurse.  Report was given to [facility] (nurse)

## 2019-07-01 NOTE — TOC Transition Note (Signed)
Transition of Care [facility]) - CM/SW Discharge Note   Patient Details  Name: Troy Wells MRN: [id number] Date of Birth: 10/04/1965  Transition of Care [facility]) CM/SW Contact:  Alexander [place], [place] Phone Number: 07/01/2019, 3:03 PM   Clinical Narrative:    CSW discussed discharge with pt daughter Troy Wells, she understands pt to have one more HD before discharge and then back to regular schedule tomorrow. Plan for pt to return to [facility] this evening. She understands beds that can transport to his HD facility are still scarce and is amenable to pt return for more rehab.   RN aware, PTAR papers sent to floor.  Should changes in timing of PTAR be needed RN to call PTAR at [phone].    Final next level of care: Skilled Nursing Facility Barriers to Discharge: Barriers Resolved   Patient Goals and CMS Choice Patient states their goals for this hospitalization and ongoing recovery are:: to get stronger and get home CMS Medicare.gov Compare Post Acute Care list provided to:: Patient Choice offered to / list presented to : Patient, Adult Children  Discharge Placement   Existing PASRR number confirmed : 06/28/18          Patient chooses bed at: [place] Patient to be transferred to facility by: [place] Name of family member notified: pt daughter Troy Wells Patient and family notified of of transfer: 07/01/19  Discharge Plan and Services In-house Referral: Clinical Social Work Discharge Planning Services: CM Consult Post Acute Care Choice: [place], Resumption of Svcs/PTA Provider            Readmission Risk Interventions Readmission Risk Prevention Plan 06/29/2019 05/17/2019  Transportation Screening Complete Complete  Medication Review Press photographer) Referral to Pharmacy Complete  PCP or Specialist appointment within 3-5 days of discharge Not Complete Complete  PCP/Specialist Appt Not Complete comments currently in a facility -  [place] or Home Care  Consult Complete Complete  SW Recovery Care/Counseling Consult Complete Complete  Palliative Care Screening Not Applicable Not [place] Complete Complete  Some recent data might be hidden

## 2019-07-01 NOTE — Progress Notes (Signed)
2115 PTAR in the unit to pick up pt for transfer to [facility]. Report already given by 1st shift Rn. Requested to have all night meds. prior to transfer. Latest BP 132/66 P88, temp 98.0, RR 18. Denies any pain rt now. D/c to SNF via PTAR.

## 2019-07-01 NOTE — Progress Notes (Signed)
Returns from dialysis- Sharee Pimple RN received report from the dialysis nurse.  I have attempted to call [facility] numerous times and no answer.  I have sent the CSW a message, in regards to a possible different number to call report to.

## 2019-07-01 NOTE — Progress Notes (Signed)
Patient to hemodialysis  

## 2019-07-01 NOTE — TOC Progression Note (Signed)
Transition of Care [facility]) - Progression Note    Patient Details  Name: Troy Wells MRN: [id number] Date of Birth: 1966-01-17  Transition of Care [facility]) CM/SW [place], [place] Phone Number: 07/01/2019, 10:02 AM  Clinical Narrative:    No further SNFs able to transport to dialysis that have bed availability. Plan for return to [facility] when pt stable. Negative COVID has resulted.    Expected Discharge Plan: [place] Barriers to Discharge: Continued Medical Work up  Expected Discharge Plan and Services Expected Discharge Plan: [place] In-house Referral: Clinical Social Work Discharge Planning Services: CM Consult Post Acute Care Choice: [place], Resumption of Svcs/PTA Provider Living arrangements for the past 2 months: [place]  Readmission Risk Interventions Readmission Risk Prevention Plan 06/29/2019 05/17/2019  Transportation Screening Complete Complete  Medication Review Press photographer) Referral to Pharmacy Complete  PCP or Specialist appointment within 3-5 days of discharge Not Complete Complete  PCP/Specialist Appt Not Complete comments currently in a facility -  [place] or Home Care Consult Complete Complete  SW Recovery Care/Counseling Consult Complete Complete  Palliative Care Screening Not Applicable Not Applicable  Skilled Nursing Facility Complete Complete  Some recent data might be hidden

## 2019-07-02 ENCOUNTER — Non-Acute Institutional Stay (SKILLED_NURSING_FACILITY): Payer: Medicare Other | Admitting: Adult Health

## 2019-07-02 ENCOUNTER — Encounter: Payer: Self-pay | Admitting: Adult Health

## 2019-07-02 DIAGNOSIS — R7881 Bacteremia: Secondary | ICD-10-CM | POA: Diagnosis not present

## 2019-07-02 DIAGNOSIS — G253 Myoclonus: Secondary | ICD-10-CM | POA: Diagnosis not present

## 2019-07-02 DIAGNOSIS — E785 Hyperlipidemia, unspecified: Secondary | ICD-10-CM

## 2019-07-02 DIAGNOSIS — N186 End stage renal disease: Secondary | ICD-10-CM

## 2019-07-02 DIAGNOSIS — Z8739 Personal history of other diseases of the musculoskeletal system and connective tissue: Secondary | ICD-10-CM

## 2019-07-02 DIAGNOSIS — E1121 Type 2 diabetes mellitus with diabetic nephropathy: Secondary | ICD-10-CM | POA: Diagnosis not present

## 2019-07-02 DIAGNOSIS — E1159 Type 2 diabetes mellitus with other circulatory complications: Secondary | ICD-10-CM

## 2019-07-02 DIAGNOSIS — B957 Other staphylococcus as the cause of diseases classified elsewhere: Secondary | ICD-10-CM | POA: Diagnosis not present

## 2019-07-02 DIAGNOSIS — D631 Anemia in chronic kidney disease: Secondary | ICD-10-CM | POA: Diagnosis not present

## 2019-07-02 DIAGNOSIS — A499 Bacterial infection, unspecified: Secondary | ICD-10-CM | POA: Diagnosis not present

## 2019-07-02 DIAGNOSIS — Z992 Dependence on renal dialysis: Secondary | ICD-10-CM | POA: Diagnosis not present

## 2019-07-02 DIAGNOSIS — I2510 Atherosclerotic heart disease of native coronary artery without angina pectoris: Secondary | ICD-10-CM

## 2019-07-02 DIAGNOSIS — I10 Essential (primary) hypertension: Secondary | ICD-10-CM

## 2019-07-02 DIAGNOSIS — N2581 Secondary hyperparathyroidism of renal origin: Secondary | ICD-10-CM | POA: Diagnosis not present

## 2019-07-02 LAB — CULTURE, BLOOD (ROUTINE X 2)
Special Requests: ADEQUATE
Special Requests: ADEQUATE

## 2019-07-02 NOTE — Progress Notes (Signed)
Location:  [facility] Room Number: 314/A Place of Service:  SNF (31) Provider:  Durenda Age, DNP, FNP-BC  Patient Care Team: [name], MD as PCP - General (Family Medicine) [name], MD as Referring Physician (Specialist)  Extended Emergency Contact Information Primary Emergency Contact: [place] Mobile Phone: [phone] Relation: Daughter Secondary Emergency Contact: Bonney [place] Mobile Phone: [phone] Relation: Sister  Code Status:  Full [place]  Goals of care: Advanced Directive information Advanced Directives 07/02/2019  Does Patient Have a Medical Advance Directive? Yes  Type of Advance Directive (No Data)  Does patient want to make changes to medical advance directive? No - Patient declined  Copy of [place] in Chart? -  Would patient like information on creating a medical advance directive? -     Chief Complaint  Patient presents with  . Acute Visit    Hospitalization Follow Up    HPI:  Pt is a 54 y.o. male who was admitted to [facility] on 07/01/19 post hospitalization 06/27/19 to 07/01/19. He has PMH of CAD, ESRD on hemodialysis MWF, hypertension, diabetes, recent prolonged hospitalization 04/14/19 to 06/16/19 for COVID-19 pneumonia and lives alone despite being unable to walk. He was hypoxic on EMS arrival, improved in the ED with oxygen suplementation  and BiPAP. He was hypothermic and tachycardic with labs significant for lactic acid of 8.2, BNP greater than 4500, respiratory viral panel negative, VBG ph 7.6, PCO2 35. CT chest was negative for PE, notable for diffuse pulmonary edema wih consolidation He was treated with Vancomycin and Cefepimet in the lingula.  He was admitted to ICU for SOB. 3/4 blood cultures on admission positive for coag negative staph. This will be treated with a total of 14 days Vancomycin with HD. He has completed course for pneumonia on  Cefepime during hospitalization.. Had worsening of myoclonus and neurology was consulted who recommended 2 HD sessions in a row. He completed the second HD on day of discharge. It was also recommended trying Clonazepam 0.5 mg BID for myoclonus and was thought to be related to the low oxygen levels on admission.    Past Medical History:  Diagnosis Date  . CARDIAC ARREST 11/01/2009   Qualifier: Diagnosis of  By: Selena Batten CMA, [place]    . Colon polyps 01/02/2016   Colonoscopy July 2017 - One 3 mm polyp in the transverse colon, removed with a cold biopsy forceps. Resected and retrieved. - One 3 mm polyp in the rectum, removed with a cold biopsy forceps. Resected and retrieved. - Diverticulosis in the entire examined colon. - Non-bleeding internal hemorrhoids. - The examination was otherwise normal. - Significant looping which prolonged cecal    . Coronary artery disease 08/15/2009   with AMI  . Coronary atherosclerosis 11/01/2009   Qualifier: Diagnosis of  By: Selena Batten CMA, Jewel    . Dialysis patient ([facility]) 09/27/2014   mon, wed, and fri  . Diverticulosis of colon without hemorrhage 01/02/2016   Colonoscopy July 2017 - One 3 mm polyp in the transverse colon, removed with a cold biopsy forceps. Resected and retrieved. - One 3 mm polyp in the rectum, removed with a cold biopsy forceps. Resected and retrieved. - Diverticulosis in the entire examined colon. - Non-bleeding internal hemorrhoids. - The examination was otherwise normal. - Significant looping which prolonged cecal    . DM (diabetes mellitus), type 2 with renal complications ([place]) 19/37/9024  . Essential hypertension 11/01/2009   Qualifier: Diagnosis of  By: Selena Batten  CMA, Jewel    . Gout 10/21/2018  . History of acute respiratory distress syndrome (ARDS) due to COVID-19 virus   . History of cardiac arrest 04/27/2019  . History of non-ST elevation myocardial infarction (NSTEMI) 04/18/2019  . history of respiratory arrest   .  Hyperlipidemia   . Hypertension   . Mitral valve regurgitation 09/19/2015   Echo 09/2015   . 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 OSA treated with BiPAP 06/17/2010  . Sleep apnea    cpap nightly   Past Surgical History:  Procedure Laterality Date  . AV FISTULA PLACEMENT Left 09/27/2014  . INSERTION OF ARTERIOVENOUS (AV) ARTEGRAFT ARM Left 04/02/2018   Procedure: INSERTION OF ARTERIOVENOUS (AV) ARTEGRAFT ARM LEFT UPPER ARM;  Surgeon: Waynetta Sandy, MD;  Location: [facility];  Service: Vascular;  Laterality: Left;  . INSERTION OF DIALYSIS CATHETER N/A 04/02/2018   Procedure: INSERTION OF DIALYSIS CATHETER, right internal jugular;  Surgeon: Waynetta Sandy, MD;  Location: [facility];  Service: Vascular;  Laterality: N/A;  . NECK SURGERY     C6 & C7 replaced 30 yrs ago per pt  . REVISON OF ARTERIOVENOUS FISTULA Left 04/02/2018   Procedure: REVISION PLICATION OF ARTERIOVENOUS FISTULA ARM;  Surgeon: Waynetta Sandy, MD;  Location: [facility];  Service: Vascular;  Laterality: Left;  . WISDOM TOOTH EXTRACTION      No Known Allergies  Outpatient Encounter Medications as of 07/02/2019  Medication Sig  . acetaminophen (TYLENOL) 325 MG tablet Take 650 mg by mouth every 6 (six) hours as needed for mild pain.  Marland Kitchen albuterol (VENTOLIN HFA) 108 (90 Base) MCG/ACT inhaler Inhale 1-2 puffs into the lungs every 6 (six) hours as needed for wheezing or shortness of breath.  . allopurinol (ZYLOPRIM) 100 MG tablet Take 100 mg by mouth 2 (two) times daily.   . Amino Acids-Protein Hydrolys (FEEDING SUPPLEMENT, PRO-STAT SUGAR FREE 64,) LIQD Take 30 mLs by mouth 2 (two) times daily.  Marland Kitchen aspirin 81 MG EC tablet Take 81 mg by mouth daily.  Marland Kitchen atorvastatin (LIPITOR) 80 MG tablet Take 1 tablet (80 mg total) by mouth daily.  . bisacodyl (DULCOLAX) 10 MG suppository Place 10 mg rectally daily as needed for moderate constipation (If not relieved by MOM).  . carvedilol (COREG) 6.25 MG tablet Take 1 tablet (6.25  mg total) by mouth 2 (two) times daily with a meal.  . cetirizine (ZYRTEC) 5 MG tablet Take 1 tablet (5 mg total) by mouth daily.  . clonazePAM (KLONOPIN) 0.5 MG tablet Take 1 tablet (0.5 mg total) by mouth 2 (two) times daily.  Marland Kitchen lidocaine (LIDODERM) 5 % Place 1 patch onto the skin daily. Remove & Discard patch within 12 hours or as directed by MD  . mirtazapine (REMERON) 15 MG tablet Take 1 tablet (15 mg total) by mouth at bedtime.  . Multiple Vitamin (MULTIVITAMIN WITH MINERALS) TABS tablet Take 1 tablet by mouth daily.  . polyethylene glycol (MIRALAX / GLYCOLAX) 17 g packet Take 17 g by mouth 2 (two) times daily.  Marland Kitchen senna (SENOKOT) 8.6 MG TABS tablet Take 1 tablet (8.6 mg total) by mouth daily.  . sevelamer carbonate (RENVELA) 800 MG tablet Take 800 mg by mouth 3 (three) times daily with meals.  . Sodium Phosphates (RA SALINE ENEMA RE) Place 1 each rectally daily as needed (If no BM after Bisacodyl).  . vancomycin (VANCOCIN) 1-5 GM/200ML-% SOLN Inject 200 mLs (1,000 mg total) into the vein every Monday, Wednesday, and Friday with hemodialysis for  8 days.  . [DISCONTINUED] PRESCRIPTION MEDICATION Pt has CPAP machine   No facility-administered encounter medications on file as of 07/02/2019.    Review of Systems  GENERAL: No change in appetite, no fatigue, no weight changes, no fever, chills or weakness MOUTH and THROAT: Denies oral discomfort, gingival pain or bleeding RESPIRATORY: no cough, SOB, DOE, wheezing, hemoptysis CARDIAC: No chest pain, edema or palpitations GI: No abdominal pain, diarrhea, constipation, heart burn, nausea or vomiting GU: anuric NEUROLOGICAL: Denies dizziness, syncope, numbness, or headache PSYCHIATRIC: Denies feelings of depression or anxiety. No report of hallucinations, insomnia, paranoia, or agitation   Immunization History  Administered Date(s) Administered  . Hepatitis B, adult 10/21/2014, 11/18/2014, 12/19/2014, 04/17/2015, 07/15/2016  .  Influenza,inj,Quad PF,6+ Mos 03/15/2019  . Influenza-Unspecified 03/06/2016, 02/12/2017  . Pneumococcal Conjugate-13 03/29/2016  . Pneumococcal Polysaccharide-23 03/29/2016, 04/25/2017   Pertinent  Health Maintenance Due  Topic Date Due  . URINE MICROALBUMIN  11/21/2017  . OPHTHALMOLOGY EXAM  12/26/2017  . FOOT EXAM  10/21/2019  . HEMOGLOBIN A1C  12/26/2019  . COLONOSCOPY  01/01/2021  . INFLUENZA VACCINE  Completed   Fall Risk  10/21/2018 02/04/2018 11/22/2016  Falls in the past year? 0 No No     Vitals:   07/02/19 1136  BP: 137/82  Pulse: 98  Resp: 20  Temp: 97.7 F (36.5 C)  TempSrc: Oral  SpO2: 98%  Weight: 201 lb 11.5 oz (91.5 kg)  Height: 5\' 10"  (1.778 m)   Body mass index is 28.94 kg/m.  Physical Exam  GENERAL APPEARANCE: Well nourished. In no acute distress. Obese SKIN:  Sacral pressure ulcer stage IV MOUTH and THROAT: Lips are without lesions. Oral mucosa is moist and without lesions. Tongue is normal in shape, size, and color and without lesions RESPIRATORY: Breathing is even & unlabored, BS CTAB CARDIAC: RRR, no murmur,no extra heart sounds, no edema. Left upper arm with AV fistula +bruit/thrill GI: Abdomen soft, normal BS, no masses, no tenderness NEUROLOGICAL: There is no tremor. Speech is clear. Alert and oriented X 3. PSYCHIATRIC:  Affect and behavior are appropriate  Labs reviewed: Recent Labs    05/12/19 0349 05/13/19 0618 05/19/19 2050 05/20/19 0228 06/28/19 0439 06/28/19 0439 06/29/19 0210 06/29/19 1117 06/30/19 1235  NA 134*   < > 135   < > 139   < > 140 140 138  K 4.8   < > 4.4   < > 4.2   < > 3.9 4.1 3.8  CL 100   < > 89*   < > 94*   < > 99 98 96*  CO2 20*   < > 21*   < > 31   < > 27 28 26   GLUCOSE 127*   < > 152*   < > 120*   < > 110* 137* 104*  BUN 68*   < > 89*   < > 30*   < > 26* 29* 39*  CREATININE 5.26*   < > 9.92*   < > 10.50*   < > 8.12* 9.22* 11.38*  CALCIUM 12.4*   < > 10.1   < > 10.1   < > 10.2 10.7* 10.5*  MG 3.2*  --  2.3   --  2.0  --   --   --   --   PHOS 7.2*   < >  --    < > 5.2*  --   --  4.5 4.6   < > = values in this interval not  displayed.   Recent Labs    05/19/19 2050 05/21/19 0129 06/27/19 2224 06/27/19 2224 06/29/19 1117 06/30/19 1001 06/30/19 1235  AST 22  --  28  --   --  20  --   ALT 28  --  24  --   --  21  --   ALKPHOS 77  --  88  --   --  77  --   BILITOT 0.6  --  0.9  --   --  0.9  --   PROT 6.8  --  6.6  --   --  6.5  --   ALBUMIN 2.5*   < > 3.1*   < > 2.9* 3.0* 2.9*   < > = values in this interval not displayed.   Recent Labs    05/12/19 0349 05/13/19 0618 05/19/19 2050 05/20/19 0228 06/27/19 2224 06/28/19 0020 06/29/19 0210 06/30/19 1235 07/01/19 0217  WBC 25.3*   < > 17.5*   < > 10.2   < > 6.9 7.0 6.0  NEUTROABS 18.1*  --  14.4*  --  4.0  --   --   --   --   HGB 9.0*   < > 8.2*   < > 10.5*   < > 9.9* 9.9* 10.0*  HCT 27.7*   < > 24.9*   < > 35.9*   < > 31.8* 30.5* 31.9*  MCV 87.1   < > 84.1   < > 96.8   < > 89.6 88.2 88.4  PLT 687*   < > 366   < > 316   < > 310 317 307   < > = values in this interval not displayed.   Lab Results  Component Value Date   TSH 1.59 08/31/2015   Lab Results  Component Value Date   HGBA1C 5.1 06/28/2019   Lab Results  Component Value Date   CHOL 207 (H) 04/14/2019   HDL 20 (L) 04/14/2019   LDLCALC 144 (H) 04/14/2019   TRIG 100 06/27/2019   CHOLHDL 10.4 04/14/2019    Significant Diagnostic Results in last 30 days:  CTA Chest for PE  Result Date: 06/28/2019 CLINICAL DATA:  Shortness of breath EXAM: CT ANGIOGRAPHY CHEST WITH CONTRAST TECHNIQUE: Multidetector CT imaging of the chest was performed using the standard protocol during bolus administration of intravenous contrast. Multiplanar CT image reconstructions and MIPs were obtained to evaluate the vascular anatomy. CONTRAST:  77mL OMNIPAQUE IOHEXOL 350 MG/ML SOLN COMPARISON:  None. FINDINGS: Cardiovascular: Contrast injection is sufficient to demonstrate satisfactory  opacification of the pulmonary arteries to the segmental level. There is no pulmonary embolus or evidence of right heart strain. The size of the main pulmonary artery is normal. Cardiomegaly with coronary artery calcification. The course and caliber of the aorta are normal. There is mild atherosclerotic calcification. Opacification decreased due to pulmonary arterial phase contrast bolus timing. Mediastinum/Nodes: No mediastinal, hilar or axillary lymphadenopathy. The visualized thyroid and thoracic esophageal course are unremarkable. Lungs/Pleura: There is diffuse pulmonary edema. There is a small area of consolidation in the lingula. No pleural effusion. The airways are patent. Upper Abdomen: Contrast bolus timing is not optimized for evaluation of the abdominal organs. The visualized portions of the organs of the upper abdomen are normal. Musculoskeletal: No chest wall abnormality. No bony spinal canal stenosis. Review of the MIP images confirms the above findings. IMPRESSION: 1. No pulmonary embolus or acute aortic syndrome. 2. Diffuse pulmonary edema. Small area of consolidation in the lingula,  which may indicate pneumonia. 3. Cardiomegaly and coronary artery calcification. Aortic Atherosclerosis (ICD10-I70.0). Electronically Signed   By: Ulyses Jarred M.D.   On: 06/28/2019 00:57   DG Chest Port 1 View  Result Date: 06/27/2019 CLINICAL DATA:  54 year old male COVID-19. Shortness of breath and hypoxia. EXAM: PORTABLE CHEST 1 VIEW COMPARISON:  Portable chest 05/19/2019 and earlier. FINDINGS: Portable AP upright view at 2237 hours. Cardiomegaly suspected. Other mediastinal contours are within normal limits. Visualized tracheal air column is within normal limits. Left greater than right mixed interstitial and airspace opacity with retrocardiac air bronchograms. The right upper lung is relatively spared. No superimposed pneumothorax or pleural effusion. No acute osseous abnormality identified. IMPRESSION: 1.  Left greater than right mixed pulmonary interstitial and airspace opacity compatible with COVID-19 pneumonia in this setting. 2. Cardiomegaly suspected. Electronically Signed   By: Genevie Ann M.D.   On: 06/27/2019 22:49   ECHOCARDIOGRAM COMPLETE  Result Date: 06/28/2019   ECHOCARDIOGRAM REPORT   Patient Name:   Troy Wells Date of Exam: 06/28/2019 Medical Rec #:  [id number]        Height:       71.0 in Accession #:    [id number]       Weight:       222.2 lb Date of Birth:  1966-01-05        BSA:          2.21 m Patient Age:    9 years         BP:           129/106 mmHg Patient Gender: M                HR:           92 bpm. Exam Location:  Inpatient Procedure: 2D Echo and Strain Analysis Indications:    Cardiomyopathy-Ischemic 414.8 / I25.5  History:        Patient has prior history of Echocardiogram examinations, most                 recent 04/27/2019. Previous Myocardial Infarction and CAD,                 Signs/Symptoms:Dyspnea; Risk Factors:Hypertension, Dyslipidemia                 and Sleep Apnea. History of COVID-19. End stage renal disease.                 Anemia.  Sonographer:    Darlina Sicilian RDCS Referring Phys: [id number] [place]  1. Left ventricular ejection fraction, by visual estimation, is 30 to 35%. The left ventricle has normal function. There is moderately increased left ventricular hypertrophy.  2. Basal inferolateral segment, basal anterolateral segment, and basal inferior segment are abnormal.  3. Left ventricular diastolic parameters are consistent with Grade I diastolic dysfunction (impaired relaxation).  4. Mildly dilated left ventricular internal cavity size.  5. The left ventricle demonstrates global hypokinesis.  6. Global right ventricle has normal systolic function.The right ventricular size is normal. No increase in right ventricular wall thickness.  7. Left atrial size was moderately dilated.  8. Right atrial size was normal.  9. The mitral valve is normal in  structure. Mild mitral valve regurgitation. No evidence of mitral stenosis. 10. The tricuspid valve is normal in structure. 11. The aortic valve is normal in structure. Aortic valve regurgitation is not visualized. No evidence of aortic valve sclerosis or stenosis. 12. The pulmonic valve  was normal in structure. Pulmonic valve regurgitation is not visualized. 13. The inferior vena cava is normal in size with greater than 50% respiratory variability, suggesting right atrial pressure of 3 mmHg. 14. The average left ventricular global longitudinal strain is -9.1 %. In comparison to the previous echocardiogram(s): EF is reduced when compared to prior echocardiogram. FINDINGS  Left Ventricle: Left ventricular ejection fraction, by visual estimation, is 30 to 35%. The left ventricle has normal function. The average left ventricular global longitudinal strain is -9.1 %. The left ventricle demonstrates global hypokinesis. The left ventricular internal cavity size was mildly dilated left ventricle. There is moderately increased left ventricular hypertrophy. Left ventricular diastolic parameters are consistent with Grade I diastolic dysfunction (impaired relaxation). Normal left atrial pressure.  LV Wall Scoring: The basal inferolateral segment, basal anterolateral segment, and basal inferior segment are akinetic. Right Ventricle: The right ventricular size is normal. No increase in right ventricular wall thickness. Global RV systolic function is has normal systolic function. Left Atrium: Left atrial size was moderately dilated. Right Atrium: Right atrial size was normal in size Pericardium: There is no evidence of pericardial effusion. Mitral Valve: The mitral valve is normal in structure. Mild mitral valve regurgitation. No evidence of mitral valve stenosis by observation. Tricuspid Valve: The tricuspid valve is normal in structure. Tricuspid valve regurgitation is not demonstrated. Aortic Valve: The aortic valve is normal  in structure. Aortic valve regurgitation is not visualized. The aortic valve is structurally normal, with no evidence of sclerosis or stenosis. Pulmonic Valve: The pulmonic valve was normal in structure. Pulmonic valve regurgitation is not visualized. Pulmonic regurgitation is not visualized. Aorta: The aortic root, ascending aorta and aortic arch are all structurally normal, with no evidence of dilitation or obstruction. Venous: The inferior vena cava is normal in size with greater than 50% respiratory variability, suggesting right atrial pressure of 3 mmHg. IAS/Shunts: No atrial level shunt detected by color flow Doppler. There is no evidence of a patent foramen ovale. No ventricular septal defect is seen or detected. There is no evidence of an atrial septal defect.  LEFT VENTRICLE PLAX 2D LVIDd:         5.80 cm       Diastology LVIDs:         4.90 cm       LV e' lateral:   4.68 cm/s LV PW:         1.10 cm       LV E/e' lateral: 21.4 LV IVS:        1.60 cm       LV e' medial:    6.96 cm/s LVOT diam:     2.20 cm       LV E/e' medial:  14.4 LV SV:         54 ml LV SV Index:   23.64         2D Longitudinal Strain LVOT Area:     3.80 cm      2D Strain GLS Avg:     -9.1 %  LV Volumes (MOD) LV area d, A2C:    68.00 cm 3D Volume EF: LV area d, A4C:    61.80 cm 3D EF:        36 % LV area s, A2C:    44.30 cm LV EDV:       317 ml LV area s, A4C:    51.10 cm LV ESV:       204 ml LV major d,  A2C:   11.90 cm  LV SV:        113 ml LV major d, A4C:   11.30 cm LV major s, A2C:   10.30 cm LV major s, A4C:   11.00 cm LV vol d, MOD A2C: 330.0 ml LV vol d, MOD A4C: 282.0 ml LV vol s, MOD A2C: 172.0 ml LV vol s, MOD A4C: 207.0 ml LV SV MOD A2C:     158.0 ml LV SV MOD A4C:     282.0 ml LV SV MOD BP:      117.2 ml RIGHT VENTRICLE RV S prime:     14.50 cm/s TAPSE (M-mode): 2.1 cm LEFT ATRIUM              Index       RIGHT ATRIUM           Index LA diam:        4.60 cm  2.09 cm/m  RA Area:     20.00 cm LA Vol (A2C):   100.0 ml  45.35 ml/m RA Volume:   56.70 ml  25.71 ml/m LA Vol (A4C):   96.1 ml  43.58 ml/m LA Biplane Vol: 99.1 ml  44.94 ml/m  AORTIC VALVE LVOT Vmax:   120.00 cm/s LVOT Vmean:  83.900 cm/s LVOT VTI:    0.201 m  AORTA Ao Root diam: 3.40 cm MITRAL VALVE MV Area (PHT): 4.80 cm              SHUNTS MV PHT:        45.82 msec            Systemic VTI:  0.20 m MV Decel Time: 158 msec              Systemic Diam: 2.20 cm MV E velocity: 100.00 cm/s 103 cm/s MV A velocity: 116.00 cm/s 70.3 cm/s MV E/A ratio:  0.86        1.5  Candee Furbish MD Electronically signed by Candee Furbish MD Signature Date/Time: 06/28/2019/12:55:04 PM    Final    ECHOCARDIOGRAM LIMITED  Result Date: 06/30/2019   ECHOCARDIOGRAM LIMITED REPORT   Patient Name:   Troy Wells Date of Exam: 06/30/2019 Medical Rec #:  [id number]        Height:       71.0 in Accession #:    [id number]       Weight:       218.7 lb Date of Birth:  04-Jul-1965        BSA:          2.19 m Patient Age:    26 years         BP:           140/91 mmHg Patient Gender: M                HR:           80 bpm. Exam Location:  Inpatient  Procedure: Limited Echo Indications:    Endocarditis I38  History:        Patient has prior history of Echocardiogram examinations, most                 recent 06/28/2019. History of Covid positive10/2020, Coronary                 atherosclerosis, PHTN.  Sonographer:    Leavy Cella Referring Phys: [id number] [place]  1. Left ventricular ejection fraction, by visual estimation, is  30%. The left ventricle has moderate to severely decreased function. There is mildly increased left ventricular wall thickness.  2. Left ventricular diastolic parameters are consistent with Grade I diastolic dysfunction (impaired relaxation).  3. Severely dilated left ventricular internal cavity size.  4. The left ventricle demonstrates regional wall motion abnormalities. Inferolateral akinesis, severe inferior hypokinesis, severe anterolateral hypokinesis.  5.  Global right ventricle has normal systolc function.The right ventricular size is normal. no increase in right ventricular wall thickness.  6. Mild mitral annular calcification.  7. The mitral valve is normal in structure. Mild mitral valve regurgitation. No evidence of mitral stenosis.  8. No evidence of aortic valve sclerosis or stenosis.  9. TR signal is inadequate for assessing pulmonary artery systolic pressure. 10. Tricuspid valve regurgitation is not demonstrated. 11. The IVC was not visualized. FINDINGS  Left Ventricle: Left ventricular ejection fraction, by visual estimation, is 30%. The left ventricle has moderate to severely decreased function. The left ventricle demonstrates regional wall motion abnormalities. The left ventricular internal cavity size was severely dilated left ventricle. There is mildly increased left ventricular wall thickness. Left ventricular diastolic parameters are consistent with Grade I diastolic dysfunction (impaired relaxation). Right Ventricle: The right ventricular size is normal. No increase in right ventricular wall thickness. Global RV systolic function is has normal systolic function. Left Atrium: Left atrial size was normal in size. Right Atrium: Right atrial size was normal in size. Pericardium: There is no evidence of pericardial effusion is seen. There is no evidence of pericardial effusion. Mitral Valve: The mitral valve is normal in structure. There is mild calcification of the mitral valve leaflet(s). Mild mitral annular calcification. No evidence of mitral valve stenosis by observation. Mild mitral valve regurgitation. Tricuspid Valve: The tricuspid valve is normal in structure. Tricuspid valve regurgitation is not demonstrated. Aortic Valve: The aortic valve is tricuspid. Aortic valve regurgitation is not visualized. The aortic valve is structurally normal, with no evidence of sclerosis or stenosis. Pulmonic Valve: The pulmonic valve was normal in structure.  Pulmonic valve regurgitation is not visualized by color flow Doppler. Pulmonic regurgitation is not visualized by color flow Doppler. Venous: The inferior vena cava was not well visualized. Shunts: No atrial level shunt detected by color flow Doppler.  Loralie Champagne MD Electronically signed by Loralie Champagne MD Signature Date/Time: 06/30/2019/3:50:37 PMThe mitral valve is normal in structure.    Final     Assessment/Plan  1. Coagulase negative Staphylococcus bacteremia - 3/4 blood cultures on admission positive for coag negative staph. This will be treated with a total of 14 days Vancomycin with HD. He has completed course for pneumonia on Cefepime during hospitalization..  2. Atherosclerosis of coronary artery without angina pectoris, unspecified vessel or lesion type, unspecified whether native or transplanted heart - no chest pains, continue ASA and Atorvastatin  3. Stage 5 chronic kidney disease on chronic dialysis ([place]) - continue hemodialysis 3X/week, [facility]  4. 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  5. Hyperlipidemia, unspecified hyperlipidemia type Lab Results  Component Value Date   CHOL 207 (H) 04/14/2019   HDL 20 (L) 04/14/2019   LDLCALC 144 (H) 04/14/2019   TRIG 100 06/27/2019   CHOLHDL 10.4 04/14/2019   - continue Atorvastatin  6. History of gout - Lab Results  Component Value Date   LABURIC 3.7 (L) 10/27/2014  - continue Allopurinol  7. Hypertension associated with diabetes ([place]) - continueCarvedilol  Family/ staff Communication:  Discussed plan of care with resident.   Labs/tests ordered:  None  Goals of care:   Short-term care   Durenda Age, DNP, FNP-BC [facility] [phone] (Monday-Friday 8:00 a.m. - 5:00 p.m.) [phone] (after hours)

## 2019-07-04 LAB — CULTURE, BLOOD (ROUTINE X 2)
Culture: NO GROWTH
Culture: NO GROWTH
Special Requests: ADEQUATE
Special Requests: ADEQUATE

## 2019-07-05 DIAGNOSIS — D631 Anemia in chronic kidney disease: Secondary | ICD-10-CM | POA: Diagnosis not present

## 2019-07-05 DIAGNOSIS — Z992 Dependence on renal dialysis: Secondary | ICD-10-CM | POA: Diagnosis not present

## 2019-07-05 DIAGNOSIS — N2581 Secondary hyperparathyroidism of renal origin: Secondary | ICD-10-CM | POA: Diagnosis not present

## 2019-07-05 DIAGNOSIS — A499 Bacterial infection, unspecified: Secondary | ICD-10-CM | POA: Diagnosis not present

## 2019-07-05 DIAGNOSIS — N186 End stage renal disease: Secondary | ICD-10-CM | POA: Diagnosis not present

## 2019-07-05 DIAGNOSIS — E1121 Type 2 diabetes mellitus with diabetic nephropathy: Secondary | ICD-10-CM | POA: Diagnosis not present

## 2019-07-07 DIAGNOSIS — N2581 Secondary hyperparathyroidism of renal origin: Secondary | ICD-10-CM | POA: Diagnosis not present

## 2019-07-07 DIAGNOSIS — Z992 Dependence on renal dialysis: Secondary | ICD-10-CM | POA: Diagnosis not present

## 2019-07-07 DIAGNOSIS — N186 End stage renal disease: Secondary | ICD-10-CM | POA: Diagnosis not present

## 2019-07-07 DIAGNOSIS — A499 Bacterial infection, unspecified: Secondary | ICD-10-CM | POA: Diagnosis not present

## 2019-07-07 DIAGNOSIS — D631 Anemia in chronic kidney disease: Secondary | ICD-10-CM | POA: Diagnosis not present

## 2019-07-07 DIAGNOSIS — E1121 Type 2 diabetes mellitus with diabetic nephropathy: Secondary | ICD-10-CM | POA: Diagnosis not present

## 2019-07-09 ENCOUNTER — Encounter: Payer: Self-pay | Admitting: Internal Medicine

## 2019-07-09 ENCOUNTER — Non-Acute Institutional Stay (SKILLED_NURSING_FACILITY): Payer: Medicare Other | Admitting: Internal Medicine

## 2019-07-09 DIAGNOSIS — B957 Other staphylococcus as the cause of diseases classified elsewhere: Secondary | ICD-10-CM

## 2019-07-09 DIAGNOSIS — E1122 Type 2 diabetes mellitus with diabetic chronic kidney disease: Secondary | ICD-10-CM

## 2019-07-09 DIAGNOSIS — N186 End stage renal disease: Secondary | ICD-10-CM | POA: Diagnosis not present

## 2019-07-09 DIAGNOSIS — I10 Essential (primary) hypertension: Secondary | ICD-10-CM

## 2019-07-09 DIAGNOSIS — E1121 Type 2 diabetes mellitus with diabetic nephropathy: Secondary | ICD-10-CM | POA: Diagnosis not present

## 2019-07-09 DIAGNOSIS — R7881 Bacteremia: Secondary | ICD-10-CM

## 2019-07-09 DIAGNOSIS — R5381 Other malaise: Secondary | ICD-10-CM

## 2019-07-09 DIAGNOSIS — Z992 Dependence on renal dialysis: Secondary | ICD-10-CM

## 2019-07-09 DIAGNOSIS — G4733 Obstructive sleep apnea (adult) (pediatric): Secondary | ICD-10-CM

## 2019-07-09 DIAGNOSIS — N2581 Secondary hyperparathyroidism of renal origin: Secondary | ICD-10-CM | POA: Diagnosis not present

## 2019-07-09 DIAGNOSIS — E1159 Type 2 diabetes mellitus with other circulatory complications: Secondary | ICD-10-CM | POA: Diagnosis not present

## 2019-07-09 DIAGNOSIS — I152 Hypertension secondary to endocrine disorders: Secondary | ICD-10-CM

## 2019-07-09 DIAGNOSIS — D631 Anemia in chronic kidney disease: Secondary | ICD-10-CM | POA: Diagnosis not present

## 2019-07-09 DIAGNOSIS — Z8739 Personal history of other diseases of the musculoskeletal system and connective tissue: Secondary | ICD-10-CM

## 2019-07-09 DIAGNOSIS — L89154 Pressure ulcer of sacral region, stage 4: Secondary | ICD-10-CM

## 2019-07-09 DIAGNOSIS — E785 Hyperlipidemia, unspecified: Secondary | ICD-10-CM | POA: Diagnosis not present

## 2019-07-09 DIAGNOSIS — A499 Bacterial infection, unspecified: Secondary | ICD-10-CM | POA: Diagnosis not present

## 2019-07-09 DIAGNOSIS — G253 Myoclonus: Secondary | ICD-10-CM

## 2019-07-09 NOTE — Progress Notes (Signed)
Provider:  Rexene Edison. Mariea Clonts, D.O., C.M.D. Location:  [facility] Room Number: 304/A Place of Service:  SNF ([id number])  PCP: [name], MD Patient Care Team: [name], MD as PCP - General (Family Medicine) [name], MD as Referring Physician (Specialist)  Extended Emergency Contact Information Primary Emergency Contact: [place] Mobile Phone: [phone] Relation: Daughter Secondary Emergency Contact: Bonney Roussel Mobile Phone: [phone] Relation: Sister  Code Status: FULL CODE Goals of Care: Advanced Directive information Advanced Directives 07/09/2019  Does Patient Have a Medical Advance Directive? Yes  Type of Advance Directive (No Data)  Does patient want to make changes to medical advance directive? No - Patient declined  Copy of [place] in Chart? -  Would patient like information on creating a medical advance directive? -   Chief Complaint  Patient presents with  . New Admit To SNF    New Admission to [facility]     HPI: Patient is a 54 y.o. male seen today for admission to [facility] s/p a complicated hospitalization from 1/10 to 07/01/19 with coag negative staph bacteremia and acute respiratory failure.  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  He's been on dialysis for 4 years due to progressive renal failure.  He was previously hospitalized with COVID pneumonia and ARDS from 10/28-12/30/20!    This last hospitalization, he presented with a nonproductive cough and was then sent out with hypoxia on 06/27/19.  He required O2 and bipap.  He was hypothermic, tachypneic, lactic acid was 8.2, BNP>4500, CT negative for PE, but did show diffuse pulmonary edema and lingular consolidation.  He has been compliant with HD.  He was treated with vanc and cefepime.  Cefepime was completed for the  pneumonia.  He had 3/4 positive blood cultures for coag negative staph and is getting 14 days of vanc at HD.  He also required an extra HD tx for his volume overload and due to myoclonic jerking.  He continued to struggle some with it and was started on a low dose of clonazepam to help with it.  He's to follow-up with neuro about the jerking.    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  He is on prostat protein supplements and this wound is being managed by the facility wound care nurse.  He's on mirtazapine for appetite stimulation and did report being hungry when I saw him.  He gets up early for HD and leaves at 5am.  It was about 1pm when I saw him.    His hba1c has been very good at 5.1 but of course affected by dialysis.  He'd previously been on glipizide and linagliptin until his covid hospitalization.  He is getting PT, OT and feels like he's made amazing progress since his initial readmission.  He is motivated and has a great attitude about continuing to follow medical advice so he can get home.  Past Medical History:  Diagnosis Date  . CARDIAC ARREST 11/01/2009   Qualifier: Diagnosis of  By: Selena Batten CMA, [place]    . Colon polyps 01/02/2016   Colonoscopy July 2017 - One 3 mm polyp in the transverse colon, removed with a cold biopsy forceps. Resected and retrieved. - One 3 mm polyp in the rectum,  removed with a cold biopsy forceps. Resected and retrieved. - Diverticulosis in the entire examined colon. - Non-bleeding internal hemorrhoids. - The examination was otherwise normal. - Significant looping which prolonged cecal    . Coronary artery disease 08/15/2009   with AMI  . Coronary atherosclerosis 11/01/2009   Qualifier: Diagnosis of  By: Selena Batten CMA, Jewel    . Dialysis patient ([place]) 09/27/2014   mon, wed, and fri  . Diverticulosis of colon without hemorrhage  01/02/2016   Colonoscopy July 2017 - One 3 mm polyp in the transverse colon, removed with a cold biopsy forceps. Resected and retrieved. - One 3 mm polyp in the rectum, removed with a cold biopsy forceps. Resected and retrieved. - Diverticulosis in the entire examined colon. - Non-bleeding internal hemorrhoids. - The examination was otherwise normal. - Significant looping which prolonged cecal    . DM (diabetes mellitus), type 2 with renal complications ([place]) 22/63/3354  . Essential hypertension 11/01/2009   Qualifier: Diagnosis of  By: Selena Batten CMA, Jewel    . Gout 10/21/2018  . History of acute respiratory distress syndrome (ARDS) due to COVID-19 virus   . History of cardiac arrest 04/27/2019  . History of non-ST elevation myocardial infarction (NSTEMI) 04/18/2019  . history of respiratory arrest   . Hyperlipidemia   . Hypertension   . Mitral valve regurgitation 09/19/2015   Echo 09/2015   . 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 OSA treated with BiPAP 06/17/2010  . Sleep apnea    cpap nightly   Past Surgical History:  Procedure Laterality Date  . AV FISTULA PLACEMENT Left 09/27/2014  . INSERTION OF ARTERIOVENOUS (AV) ARTEGRAFT ARM Left 04/02/2018   Procedure: INSERTION OF ARTERIOVENOUS (AV) ARTEGRAFT ARM LEFT UPPER ARM;  Surgeon: Waynetta Sandy, MD;  Location: [facility];  Service: Vascular;  Laterality: Left;  . INSERTION OF DIALYSIS CATHETER N/A 04/02/2018   Procedure: INSERTION OF DIALYSIS CATHETER, right internal jugular;  Surgeon: Waynetta Sandy, MD;  Location: [place];  Service: Vascular;  Laterality: N/A;  . NECK SURGERY     C6 & C7 replaced 30 yrs ago per pt  . REVISON OF ARTERIOVENOUS FISTULA Left 04/02/2018   Procedure: REVISION PLICATION OF ARTERIOVENOUS FISTULA ARM;  Surgeon: Waynetta Sandy, MD;  Location: [place];  Service: Vascular;  Laterality: Left;  . WISDOM TOOTH EXTRACTION      Social History   Socioeconomic History  . Marital status: Single     Spouse name: Not on file  . Number of children: Not on file  . Years of education: Not on file  . Highest education level: Not on file  Occupational History  . Not on file  Tobacco Use  . Smoking status: Former Smoker    Packs/day: 1.00    Years: 10.00    Pack years: 10.00    Types: Cigars    Quit date: 06/18/2011    Years since quitting: 8.0  . Smokeless tobacco: Never Used  Substance and Sexual Activity  . Alcohol use: Yes    Alcohol/week: 1.0 standard drinks    Types: 1 Standard drinks or equivalent per week    Comment: beer or liquor occasionally  . Drug use: No  . Sexual activity: Yes  Other Topics Concern  . Not on file  Social History Narrative   Updated 08/31/15 by Dr. Dossie Arbour    Currently Unemployed. On Disability.   Lives with daughter, grandson, and nephew   Social Determinants of Health   Financial Resource Strain:   .  Difficulty of Paying Living Expenses: Not on file  Food Insecurity:   . Worried About Charity fundraiser in the Last Year: Not on file  . Ran Out of Food in the Last Year: Not on file  Transportation Needs:   . Lack of Transportation (Medical): Not on file  . Lack of Transportation (Non-Medical): Not on file  Physical Activity:   . Days of Exercise per Week: Not on file  . Minutes of Exercise per Session: Not on file  Stress:   . Feeling of Stress : Not on file  Social Connections:   . Frequency of Communication with Friends and Family: Not on file  . Frequency of Social Gatherings with Friends and Family: Not on file  . Attends Religious Services: Not on file  . Active Member of Clubs or Organizations: Not on file  . Attends Archivist Meetings: Not on file  . Marital Status: Not on file    reports that he quit smoking about 8 years ago. His smoking use included cigars. He has a 10.00 pack-year smoking history. He has never used smokeless tobacco. He reports current alcohol use of about 1.0 standard drinks of alcohol per  week. He reports that he does not use drugs.  Functional Status Survey:    Family History  Problem Relation Age of Onset  . Hypertension Mother   . Heart disease Mother 20       cause of death  . Hypertension Father   . Aneurysm Father 36       brain aneurysm  . Heart disease Father 29       cause of death  . Hypertension Brother   . Alcohol abuse Brother   . Diabetes Brother   . Pancreatitis Brother   . Hypertension Brother   . Colon cancer Neg Hx     Health Maintenance  Topic Date Due  . TETANUS/TDAP  10/01/1983  . URINE MICROALBUMIN  11/21/2017  . OPHTHALMOLOGY EXAM  12/26/2017  . FOOT EXAM  10/21/2019  . HEMOGLOBIN A1C  12/26/2019  . COLONOSCOPY  01/01/2021  . INFLUENZA VACCINE  Completed  . PNEUMOCOCCAL POLYSACCHARIDE VACCINE AGE 63-64 HIGH RISK  Completed  . HIV Screening  Completed    No Known Allergies  Outpatient Encounter Medications as of 07/09/2019  Medication Sig  . acetaminophen (TYLENOL) 325 MG tablet Take 650 mg by mouth every 6 (six) hours as needed for mild pain.  Marland Kitchen albuterol (VENTOLIN HFA) 108 (90 Base) MCG/ACT inhaler Inhale 1-2 puffs into the lungs every 6 (six) hours as needed for wheezing or shortness of breath.  . allopurinol (ZYLOPRIM) 100 MG tablet Take 100 mg by mouth 2 (two) times daily.   . Amino Acids-Protein Hydrolys (FEEDING SUPPLEMENT, PRO-STAT SUGAR FREE 64,) LIQD Take 30 mLs by mouth 2 (two) times daily.  Marland Kitchen aspirin 81 MG EC tablet Take 81 mg by mouth daily.  Marland Kitchen atorvastatin (LIPITOR) 80 MG tablet Take 1 tablet (80 mg total) by mouth daily.  . B Complex-C-Folic Acid (NEPHRO-VITE PO) Take 1 tablet by mouth daily.  . bisacodyl (DULCOLAX) 10 MG suppository Place 10 mg rectally daily as needed for moderate constipation (If not relieved by MOM).  . carvedilol (COREG) 6.25 MG tablet Take 1 tablet (6.25 mg total) by mouth 2 (two) times daily with a meal.  . cetirizine (ZYRTEC) 5 MG tablet Take 1 tablet (5 mg total) by mouth daily.  .  clonazePAM (KLONOPIN) 0.5 MG tablet Take 1 tablet (0.5  mg total) by mouth 2 (two) times daily.  Marland Kitchen lidocaine (LIDODERM) 5 % Place 1 patch onto the skin daily. Remove & Discard patch within 12 hours or as directed by MD  . mirtazapine (REMERON) 15 MG tablet Take 1 tablet (15 mg total) by mouth at bedtime.  . polyethylene glycol (MIRALAX / GLYCOLAX) 17 g packet Take 17 g by mouth 2 (two) times daily as needed (For Constipation).  Marland Kitchen senna (SENOKOT) 8.6 MG TABS tablet Take 1 tablet (8.6 mg total) by mouth daily.  . sevelamer carbonate (RENVELA) 800 MG tablet Take 800 mg by mouth 3 (three) times daily with meals.  . Sodium Phosphates (RA SALINE ENEMA RE) Place 1 each rectally daily as needed (If no BM after Bisacodyl).  . [DISCONTINUED] Multiple Vitamin (MULTIVITAMIN WITH MINERALS) TABS tablet Take 1 tablet by mouth daily.  . [DISCONTINUED] polyethylene glycol (MIRALAX / GLYCOLAX) 17 g packet Take 17 g by mouth 2 (two) times daily. (Patient taking differently: Take 17 g by mouth daily as needed. For constipation)  . [DISCONTINUED] vancomycin (VANCOCIN) 1-5 GM/200ML-% SOLN Inject 200 mLs (1,000 mg total) into the vein every Monday, Wednesday, and Friday with hemodialysis for 8 days.   No facility-administered encounter medications on file as of 07/09/2019.    Review of Systems  Constitutional: Positive for malaise/fatigue. Negative for chills and fever.       Fatigued post-HD  HENT: Negative for congestion, hearing loss and sore throat.   Eyes: Negative for blurred vision.  Respiratory: Negative for cough and shortness of breath.   Cardiovascular: Negative for chest pain, palpitations and leg swelling.  Gastrointestinal: Negative for abdominal pain, blood in stool, constipation, diarrhea and melena.  Genitourinary: Negative for dysuria.       Oliguric  Musculoskeletal: Positive for back pain. Negative for falls and myalgias.       Back/buttock pain from ulcer  Skin:       Sacral pressure ulcer    Neurological: Negative for dizziness and loss of consciousness.  Psychiatric/Behavioral: Negative for depression and memory loss. The patient is not nervous/anxious and does not have insomnia.     Vitals:   07/09/19 0801  BP: 127/83  Pulse: 90  Resp: 18  Temp: (!) 97.1 F (36.2 C)  TempSrc: Oral  SpO2: 98%  Weight: 211 lb (95.7 kg)  Height: 5\' 10"  (1.778 m)   Body mass index is 30.28 kg/m. Physical Exam Vitals reviewed.  Constitutional:      General: He is not in acute distress.    Appearance: He is not ill-appearing or toxic-appearing.     Comments: Occasional jerking of lower extremities during visit as he sat on side of bed  HENT:     Head: Normocephalic and atraumatic.     Right Ear: External ear normal.     Left Ear: External ear normal.     Mouth/Throat:     Pharynx: Oropharynx is clear.  Eyes:     Extraocular Movements: Extraocular movements intact.     Conjunctiva/sclera: Conjunctivae normal.     Pupils: Pupils are equal, round, and reactive to light.  Cardiovascular:     Rate and Rhythm: Normal rate and regular rhythm.     Heart sounds: Murmur present.  Pulmonary:     Effort: Pulmonary effort is normal.     Breath sounds: No rales.  Abdominal:     General: Bowel sounds are normal. There is no distension.     Palpations: Abdomen is soft.  Tenderness: There is no abdominal tenderness. There is no guarding or rebound.  Musculoskeletal:        General: Normal range of motion.     Right lower leg: No edema.     Left lower leg: No edema.  Skin:    Comments: Large stage IV sacral pressure ulcer currently covered with duoderm  Neurological:     General: No focal deficit present.     Mental Status: He is alert and oriented to person, place, and time.     Motor: Weakness present.     Comments: Using walker with PT, has wheelchair at bedside  Psychiatric:        Mood and Affect: Mood normal.        Behavior: Behavior normal.        Thought Content: Thought  content normal.        Judgment: Judgment normal.     Labs reviewed: Basic Metabolic Panel: Recent Labs    05/12/19 0349 05/13/19 0618 05/19/19 2050 05/20/19 0228 06/28/19 0439 06/28/19 0439 06/29/19 0210 06/29/19 1117 06/30/19 1235  NA 134*   < > 135   < > 139   < > 140 140 138  K 4.8   < > 4.4   < > 4.2   < > 3.9 4.1 3.8  CL 100   < > 89*   < > 94*   < > 99 98 96*  CO2 20*   < > 21*   < > 31   < > 27 28 26   GLUCOSE 127*   < > 152*   < > 120*   < > 110* 137* 104*  BUN 68*   < > 89*   < > 30*   < > 26* 29* 39*  CREATININE 5.26*   < > 9.92*   < > 10.50*   < > 8.12* 9.22* 11.38*  CALCIUM 12.4*   < > 10.1   < > 10.1   < > 10.2 10.7* 10.5*  MG 3.2*  --  2.3  --  2.0  --   --   --   --   PHOS 7.2*   < >  --    < > 5.2*  --   --  4.5 4.6   < > = values in this interval not displayed.   Liver Function Tests: Recent Labs    05/19/19 2050 05/21/19 0129 06/27/19 2224 06/27/19 2224 06/29/19 1117 06/30/19 1001 06/30/19 1235  AST 22  --  28  --   --  20  --   ALT 28  --  24  --   --  21  --   ALKPHOS 77  --  88  --   --  77  --   BILITOT 0.6  --  0.9  --   --  0.9  --   PROT 6.8  --  6.6  --   --  6.5  --   ALBUMIN 2.5*   < > 3.1*   < > 2.9* 3.0* 2.9*   < > = values in this interval not displayed.   Recent Labs    04/13/19 1116  LIPASE 37   Recent Labs    04/27/19 1659 05/15/19 0246 06/30/19 1001  AMMONIA 50* 28 19   CBC: Recent Labs    05/12/19 0349 05/13/19 0618 05/19/19 2050 05/20/19 0228 06/27/19 2224 06/28/19 0020 06/29/19 0210 06/30/19 1235 07/01/19 0217  WBC 25.3*   < >  17.5*   < > 10.2   < > 6.9 7.0 6.0  NEUTROABS 18.1*  --  14.4*  --  4.0  --   --   --   --   HGB 9.0*   < > 8.2*   < > 10.5*   < > 9.9* 9.9* 10.0*  HCT 27.7*   < > 24.9*   < > 35.9*   < > 31.8* 30.5* 31.9*  MCV 87.1   < > 84.1   < > 96.8   < > 89.6 88.2 88.4  PLT 687*   < > 366   < > 316   < > 310 317 307   < > = values in this interval not displayed.   Cardiac Enzymes: Recent  Labs    04/14/19 0200 05/19/19 2050  CKTOTAL 371 39*   BNP: Invalid input(s): POCBNP Lab Results  Component Value Date   HGBA1C 5.1 06/28/2019   Lab Results  Component Value Date   TSH 1.59 08/31/2015   No results found for: VITAMINB12 No results found for: FOLATE Lab Results  Component Value Date   FERRITIN 2,719 (H) 06/27/2019    Imaging and Procedures obtained prior to SNF admission: CTA Chest for PE  Result Date: 06/28/2019 CLINICAL DATA:  Shortness of breath EXAM: CT ANGIOGRAPHY CHEST WITH CONTRAST TECHNIQUE: Multidetector CT imaging of the chest was performed using the standard protocol during bolus administration of intravenous contrast. Multiplanar CT image reconstructions and MIPs were obtained to evaluate the vascular anatomy. CONTRAST:  58mL OMNIPAQUE IOHEXOL 350 MG/ML SOLN COMPARISON:  None. FINDINGS: Cardiovascular: Contrast injection is sufficient to demonstrate satisfactory opacification of the pulmonary arteries to the segmental level. There is no pulmonary embolus or evidence of right heart strain. The size of the main pulmonary artery is normal. Cardiomegaly with coronary artery calcification. The course and caliber of the aorta are normal. There is mild atherosclerotic calcification. Opacification decreased due to pulmonary arterial phase contrast bolus timing. Mediastinum/Nodes: No mediastinal, hilar or axillary lymphadenopathy. The visualized thyroid and thoracic esophageal course are unremarkable. Lungs/Pleura: There is diffuse pulmonary edema. There is a small area of consolidation in the lingula. No pleural effusion. The airways are patent. Upper Abdomen: Contrast bolus timing is not optimized for evaluation of the abdominal organs. The visualized portions of the organs of the upper abdomen are normal. Musculoskeletal: No chest wall abnormality. No bony spinal canal stenosis. Review of the MIP images confirms the above findings. IMPRESSION: 1. No pulmonary embolus or  acute aortic syndrome. 2. Diffuse pulmonary edema. Small area of consolidation in the lingula, which may indicate pneumonia. 3. Cardiomegaly and coronary artery calcification. Aortic Atherosclerosis (ICD10-I70.0). Electronically Signed   By: Ulyses Jarred M.D.   On: 06/28/2019 00:57   DG Chest Port 1 View  Result Date: 06/27/2019 CLINICAL DATA:  54 year old male COVID-19. Shortness of breath and hypoxia. EXAM: PORTABLE CHEST 1 VIEW COMPARISON:  Portable chest 05/19/2019 and earlier. FINDINGS: Portable AP upright view at 2237 hours. Cardiomegaly suspected. Other mediastinal contours are within normal limits. Visualized tracheal air column is within normal limits. Left greater than right mixed interstitial and airspace opacity with retrocardiac air bronchograms. The right upper lung is relatively spared. No superimposed pneumothorax or pleural effusion. No acute osseous abnormality identified. IMPRESSION: 1. Left greater than right mixed pulmonary interstitial and airspace opacity compatible with COVID-19 pneumonia in this setting. 2. Cardiomegaly suspected. Electronically Signed   By: Genevie Ann M.D.   On: 06/27/2019 22:49  ECHOCARDIOGRAM COMPLETE  Result Date: 06/28/2019   ECHOCARDIOGRAM REPORT   Patient Name:   ARTHOR GORTER Date of Exam: 06/28/2019 Medical Rec #:  [id number]        Height:       71.0 in Accession #:    [id number]       Weight:       222.2 lb Date of Birth:  1965/09/17        BSA:          2.21 m Patient Age:    35 years         BP:           129/106 mmHg Patient Gender: M                HR:           92 bpm. Exam Location:  Inpatient Procedure: 2D Echo and Strain Analysis Indications:    Cardiomyopathy-Ischemic 414.8 / I25.5  History:        Patient has prior history of Echocardiogram examinations, most                 recent 04/27/2019. Previous Myocardial Infarction and CAD,                 Signs/Symptoms:Dyspnea; Risk Factors:Hypertension, Dyslipidemia                 and Sleep Apnea.  History of COVID-19. End stage renal disease.                 Anemia.  Sonographer:    Darlina Sicilian RDCS Referring Phys: [id number] [place]  1. Left ventricular ejection fraction, by visual estimation, is 30 to 35%. The left ventricle has normal function. There is moderately increased left ventricular hypertrophy.  2. Basal inferolateral segment, basal anterolateral segment, and basal inferior segment are abnormal.  3. Left ventricular diastolic parameters are consistent with Grade I diastolic dysfunction (impaired relaxation).  4. Mildly dilated left ventricular internal cavity size.  5. The left ventricle demonstrates global hypokinesis.  6. Global right ventricle has normal systolic function.The right ventricular size is normal. No increase in right ventricular wall thickness.  7. Left atrial size was moderately dilated.  8. Right atrial size was normal.  9. The mitral valve is normal in structure. Mild mitral valve regurgitation. No evidence of mitral stenosis. 10. The tricuspid valve is normal in structure. 11. The aortic valve is normal in structure. Aortic valve regurgitation is not visualized. No evidence of aortic valve sclerosis or stenosis. 12. The pulmonic valve was normal in structure. Pulmonic valve regurgitation is not visualized. 13. The inferior vena cava is normal in size with greater than 50% respiratory variability, suggesting right atrial pressure of 3 mmHg. 14. The average left ventricular global longitudinal strain is -9.1 %. In comparison to the previous echocardiogram(s): EF is reduced when compared to prior echocardiogram. FINDINGS  Left Ventricle: Left ventricular ejection fraction, by visual estimation, is 30 to 35%. The left ventricle has normal function. The average left ventricular global longitudinal strain is -9.1 %. The left ventricle demonstrates global hypokinesis. The left ventricular internal cavity size was mildly dilated left ventricle. There is moderately  increased left ventricular hypertrophy. Left ventricular diastolic parameters are consistent with Grade I diastolic dysfunction (impaired relaxation). Normal left atrial pressure.  LV Wall Scoring: The basal inferolateral segment, basal anterolateral segment, and basal inferior segment are akinetic. Right Ventricle: The right ventricular size is normal. No  increase in right ventricular wall thickness. Global RV systolic function is has normal systolic function. Left Atrium: Left atrial size was moderately dilated. Right Atrium: Right atrial size was normal in size Pericardium: There is no evidence of pericardial effusion. Mitral Valve: The mitral valve is normal in structure. Mild mitral valve regurgitation. No evidence of mitral valve stenosis by observation. Tricuspid Valve: The tricuspid valve is normal in structure. Tricuspid valve regurgitation is not demonstrated. Aortic Valve: The aortic valve is normal in structure. Aortic valve regurgitation is not visualized. The aortic valve is structurally normal, with no evidence of sclerosis or stenosis. Pulmonic Valve: The pulmonic valve was normal in structure. Pulmonic valve regurgitation is not visualized. Pulmonic regurgitation is not visualized. Aorta: The aortic root, ascending aorta and aortic arch are all structurally normal, with no evidence of dilitation or obstruction. Venous: The inferior vena cava is normal in size with greater than 50% respiratory variability, suggesting right atrial pressure of 3 mmHg. IAS/Shunts: No atrial level shunt detected by color flow Doppler. There is no evidence of a patent foramen ovale. No ventricular septal defect is seen or detected. There is no evidence of an atrial septal defect.  LEFT VENTRICLE PLAX 2D LVIDd:         5.80 cm       Diastology LVIDs:         4.90 cm       LV e' lateral:   4.68 cm/s LV PW:         1.10 cm       LV E/e' lateral: 21.4 LV IVS:        1.60 cm       LV e' medial:    6.96 cm/s LVOT diam:     2.20  cm       LV E/e' medial:  14.4 LV SV:         54 ml LV SV Index:   23.64         2D Longitudinal Strain LVOT Area:     3.80 cm      2D Strain GLS Avg:     -9.1 %  LV Volumes (MOD) LV area d, A2C:    68.00 cm 3D Volume EF: LV area d, A4C:    61.80 cm 3D EF:        36 % LV area s, A2C:    44.30 cm LV EDV:       317 ml LV area s, A4C:    51.10 cm LV ESV:       204 ml LV major d, A2C:   11.90 cm  LV SV:        113 ml LV major d, A4C:   11.30 cm LV major s, A2C:   10.30 cm LV major s, A4C:   11.00 cm LV vol d, MOD A2C: 330.0 ml LV vol d, MOD A4C: 282.0 ml LV vol s, MOD A2C: 172.0 ml LV vol s, MOD A4C: 207.0 ml LV SV MOD A2C:     158.0 ml LV SV MOD A4C:     282.0 ml LV SV MOD BP:      117.2 ml RIGHT VENTRICLE RV S prime:     14.50 cm/s TAPSE (M-mode): 2.1 cm LEFT ATRIUM              Index       RIGHT ATRIUM           Index LA diam:  4.60 cm  2.09 cm/m  RA Area:     20.00 cm LA Vol (A2C):   100.0 ml 45.35 ml/m RA Volume:   56.70 ml  25.71 ml/m LA Vol (A4C):   96.1 ml  43.58 ml/m LA Biplane Vol: 99.1 ml  44.94 ml/m  AORTIC VALVE LVOT Vmax:   120.00 cm/s LVOT Vmean:  83.900 cm/s LVOT VTI:    0.201 m  AORTA Ao Root diam: 3.40 cm MITRAL VALVE MV Area (PHT): 4.80 cm              SHUNTS MV PHT:        45.82 msec            Systemic VTI:  0.20 m MV Decel Time: 158 msec              Systemic Diam: 2.20 cm MV E velocity: 100.00 cm/s 103 cm/s MV A velocity: 116.00 cm/s 70.3 cm/s MV E/A ratio:  0.86        1.5  Candee Furbish MD Electronically signed by Candee Furbish MD Signature Date/Time: 06/28/2019/12:55:04 PM    Final     Assessment/Plan 1. Coagulase negative Staphylococcus bacteremia -completing his vanc IV at HD for 14 days -clinically appears recovered from this  2. Stage 5 chronic kidney disease on chronic dialysis ([place]) -continues on HD M/W/F and had just returned when seen  3. Myoclonic jerking -continues to still have some of this and is now on clonazepam to help with it per neuro -seems it should  not persist if electrolytes best managed   4. Sacral decubitus ulcer, stage IV ([place]) -continue current wound care per wound care nurse--it's reportedly half the size it was when he arrived after his covid hospitalization -still has pain there and using lidocaine patch above and tylenol  5. Type 2 diabetes mellitus with chronic kidney disease on chronic dialysis, without long-term current use of insulin ([facility]) -has been well controlled, appetite improving so monitor closely -not on any meds at present and may need to resume if CBGs begin running high  6. Hyperlipidemia, unspecified hyperlipidemia type -continues on high dose lipitor which he really needed based on last cholesterol level back in October  (suspect much better after lack of intake when acutely ill) Lab Results  Component Value Date   LDLCALC 144 (H) 04/14/2019   7. History of gout -continues on allopurinol bid for this, no acute flares Lab Results  Component Value Date   LABURIC 3.7 (L) 10/27/2014   8. Hypertension associated with diabetes ([place]) -bp great today, continues on coreg therapy  9. Physical deconditioning -here for PT, OT and wound care after prolonged hospitalization and then recurrent hospitalization with bacteremia -gradually improving and had a great attitude during our visit  10. OSA treated with BiPAP -I did not note if he had his machine with him, but he should be using his equipment at hs  Family/ staff Communication: discussed with snf nurse  Labs/tests ordered:  No new  Naesha Buckalew L. Lloyd Ayo, D.O. [facility] [address], [place] [address] Cell Phone (Mon-Fri 8am-5pm):  [phone] On Call:  [phone] & follow prompts after 5pm & weekends Office Phone:  [phone] Office Fax:  [phone]

## 2019-07-12 DIAGNOSIS — N2581 Secondary hyperparathyroidism of renal origin: Secondary | ICD-10-CM | POA: Diagnosis not present

## 2019-07-12 DIAGNOSIS — D631 Anemia in chronic kidney disease: Secondary | ICD-10-CM | POA: Diagnosis not present

## 2019-07-12 DIAGNOSIS — E1121 Type 2 diabetes mellitus with diabetic nephropathy: Secondary | ICD-10-CM | POA: Diagnosis not present

## 2019-07-12 DIAGNOSIS — N186 End stage renal disease: Secondary | ICD-10-CM | POA: Diagnosis not present

## 2019-07-12 DIAGNOSIS — A499 Bacterial infection, unspecified: Secondary | ICD-10-CM | POA: Diagnosis not present

## 2019-07-12 DIAGNOSIS — Z992 Dependence on renal dialysis: Secondary | ICD-10-CM | POA: Diagnosis not present

## 2019-07-14 DIAGNOSIS — N2581 Secondary hyperparathyroidism of renal origin: Secondary | ICD-10-CM | POA: Diagnosis not present

## 2019-07-14 DIAGNOSIS — Z992 Dependence on renal dialysis: Secondary | ICD-10-CM | POA: Diagnosis not present

## 2019-07-14 DIAGNOSIS — D631 Anemia in chronic kidney disease: Secondary | ICD-10-CM | POA: Diagnosis not present

## 2019-07-14 DIAGNOSIS — E1121 Type 2 diabetes mellitus with diabetic nephropathy: Secondary | ICD-10-CM | POA: Diagnosis not present

## 2019-07-14 DIAGNOSIS — N186 End stage renal disease: Secondary | ICD-10-CM | POA: Diagnosis not present

## 2019-07-14 DIAGNOSIS — A499 Bacterial infection, unspecified: Secondary | ICD-10-CM | POA: Diagnosis not present

## 2019-07-16 DIAGNOSIS — A499 Bacterial infection, unspecified: Secondary | ICD-10-CM | POA: Diagnosis not present

## 2019-07-16 DIAGNOSIS — N186 End stage renal disease: Secondary | ICD-10-CM | POA: Diagnosis not present

## 2019-07-16 DIAGNOSIS — Z992 Dependence on renal dialysis: Secondary | ICD-10-CM | POA: Diagnosis not present

## 2019-07-16 DIAGNOSIS — N2581 Secondary hyperparathyroidism of renal origin: Secondary | ICD-10-CM | POA: Diagnosis not present

## 2019-07-16 DIAGNOSIS — D631 Anemia in chronic kidney disease: Secondary | ICD-10-CM | POA: Diagnosis not present

## 2019-07-16 DIAGNOSIS — E1121 Type 2 diabetes mellitus with diabetic nephropathy: Secondary | ICD-10-CM | POA: Diagnosis not present

## 2019-07-19 DIAGNOSIS — N2581 Secondary hyperparathyroidism of renal origin: Secondary | ICD-10-CM | POA: Diagnosis not present

## 2019-07-19 DIAGNOSIS — E1122 Type 2 diabetes mellitus with diabetic chronic kidney disease: Secondary | ICD-10-CM | POA: Diagnosis not present

## 2019-07-19 DIAGNOSIS — E1121 Type 2 diabetes mellitus with diabetic nephropathy: Secondary | ICD-10-CM | POA: Diagnosis not present

## 2019-07-19 DIAGNOSIS — Z992 Dependence on renal dialysis: Secondary | ICD-10-CM | POA: Diagnosis not present

## 2019-07-19 DIAGNOSIS — N186 End stage renal disease: Secondary | ICD-10-CM | POA: Diagnosis not present

## 2019-07-21 DIAGNOSIS — E1121 Type 2 diabetes mellitus with diabetic nephropathy: Secondary | ICD-10-CM | POA: Diagnosis not present

## 2019-07-21 DIAGNOSIS — N2581 Secondary hyperparathyroidism of renal origin: Secondary | ICD-10-CM | POA: Diagnosis not present

## 2019-07-21 DIAGNOSIS — N186 End stage renal disease: Secondary | ICD-10-CM | POA: Diagnosis not present

## 2019-07-21 DIAGNOSIS — Z992 Dependence on renal dialysis: Secondary | ICD-10-CM | POA: Diagnosis not present

## 2019-07-22 ENCOUNTER — Other Ambulatory Visit: Payer: Self-pay | Admitting: Adult Health

## 2019-07-22 MED ORDER — CLONAZEPAM 0.5 MG PO TABS: 0.5000 mg | ORAL_TABLET | Freq: Two times a day (BID) | ORAL | 0 refills | Status: DC

## 2019-07-23 DIAGNOSIS — N186 End stage renal disease: Secondary | ICD-10-CM | POA: Diagnosis not present

## 2019-07-23 DIAGNOSIS — Z992 Dependence on renal dialysis: Secondary | ICD-10-CM | POA: Diagnosis not present

## 2019-07-23 DIAGNOSIS — E1121 Type 2 diabetes mellitus with diabetic nephropathy: Secondary | ICD-10-CM | POA: Diagnosis not present

## 2019-07-23 DIAGNOSIS — N2581 Secondary hyperparathyroidism of renal origin: Secondary | ICD-10-CM | POA: Diagnosis not present

## 2019-07-26 ENCOUNTER — Non-Acute Institutional Stay (SKILLED_NURSING_FACILITY): Payer: Medicare Other | Admitting: Adult Health

## 2019-07-26 ENCOUNTER — Encounter: Payer: Self-pay | Admitting: Adult Health

## 2019-07-26 DIAGNOSIS — E1122 Type 2 diabetes mellitus with diabetic chronic kidney disease: Secondary | ICD-10-CM | POA: Diagnosis not present

## 2019-07-26 DIAGNOSIS — E1121 Type 2 diabetes mellitus with diabetic nephropathy: Secondary | ICD-10-CM | POA: Diagnosis not present

## 2019-07-26 DIAGNOSIS — Z8739 Personal history of other diseases of the musculoskeletal system and connective tissue: Secondary | ICD-10-CM

## 2019-07-26 DIAGNOSIS — Z7189 Other specified counseling: Secondary | ICD-10-CM | POA: Diagnosis not present

## 2019-07-26 DIAGNOSIS — E782 Mixed hyperlipidemia: Secondary | ICD-10-CM

## 2019-07-26 DIAGNOSIS — N186 End stage renal disease: Secondary | ICD-10-CM | POA: Diagnosis not present

## 2019-07-26 DIAGNOSIS — I10 Essential (primary) hypertension: Secondary | ICD-10-CM | POA: Diagnosis not present

## 2019-07-26 DIAGNOSIS — I2510 Atherosclerotic heart disease of native coronary artery without angina pectoris: Secondary | ICD-10-CM | POA: Diagnosis not present

## 2019-07-26 DIAGNOSIS — Z992 Dependence on renal dialysis: Secondary | ICD-10-CM

## 2019-07-26 DIAGNOSIS — G253 Myoclonus: Secondary | ICD-10-CM

## 2019-07-26 DIAGNOSIS — N2581 Secondary hyperparathyroidism of renal origin: Secondary | ICD-10-CM | POA: Diagnosis not present

## 2019-07-26 DIAGNOSIS — I152 Hypertension secondary to endocrine disorders: Secondary | ICD-10-CM

## 2019-07-26 DIAGNOSIS — E1159 Type 2 diabetes mellitus with other circulatory complications: Secondary | ICD-10-CM

## 2019-07-26 NOTE — Progress Notes (Addendum)
Location:  [name]: 193-X Place of Service:  SNF (31) Provider:  Durenda Age, DNP, FNP-BC  Patient Care Team: [name], MD as PCP - General (Family Medicine) [name], MD as Referring Physician (Specialist)  Extended Emergency Contact Information Primary Emergency Contact: [place] Mobile Phone: [phone] Relation: Daughter Secondary Emergency Contact: Bonney Roussel Mobile Phone: [phone] Relation: Sister  Code Status:  FULL CODE  Goals of care: Advanced Directive information Advanced Directives 07/09/2019  Does Patient Have a Medical Advance Directive? Yes  Type of Advance Directive (No Data)  Does patient want to make changes to medical advance directive? No - Patient declined  Copy of [place] in Chart? -  Would patient like information on creating a medical advance directive? -     Chief Complaint  Patient presents with  . Advanced Directive    Patient is seen for a care plan meeting.    HPI:  Pt is a 54 y.o. male seen today for a care plan meeting. The meeting was attended by resident, NP, social worker, PT and daughter who attended via telephone conference. Code status remains to be full code according to resident and daughter. PT discussed on mobility issues and the DME needed when he goes home. He continues to have the shaking and this presents challenges when he is at home. Daughter verbalized that there will be family members who will take turns in taking care of resident at home.[place] Resident and daughter verbalized that they are aware of what medications he is taking and wants his medication e-prescriptions sent to [facility]	[place] which is inside [place] at [employer]. Daughter would like to learn techniques on how to assist resident during transfers. PT and daughter agreed to have a facetime session to teach transferring. DME needed will be 3-in-1 with right  drop arm, , shower bench, wheelchair and walker. Home health PT, OT, CNA and Nurse were recommended to follow him up at home. The meeting lasted for 30 minutes.   Past Medical History:  Diagnosis Date  . CARDIAC ARREST 11/01/2009   Qualifier: Diagnosis of  By: Selena Batten CMA, [place]    . Colon polyps 01/02/2016   Colonoscopy July 2017 - One 3 mm polyp in the transverse colon, removed with a cold biopsy forceps. Resected and retrieved. - One 3 mm polyp in the rectum, removed with a cold biopsy forceps. Resected and retrieved. - Diverticulosis in the entire examined colon. - Non-bleeding internal hemorrhoids. - The examination was otherwise normal. - Significant looping which prolonged cecal    . Coronary artery disease 08/15/2009   with AMI  . Coronary atherosclerosis 11/01/2009   Qualifier: Diagnosis of  By: Selena Batten CMA, Jewel    . Dialysis patient ([place]) 09/27/2014   mon, wed, and fri  . Diverticulosis of colon without hemorrhage 01/02/2016   Colonoscopy July 2017 - One 3 mm polyp in the transverse colon, removed with a cold biopsy forceps. Resected and retrieved. - One 3 mm polyp in the rectum, removed with a cold biopsy forceps. Resected and retrieved. - Diverticulosis in the entire examined colon. - Non-bleeding internal hemorrhoids. - The examination was otherwise normal. - Significant looping which prolonged cecal    . DM (diabetes mellitus), type 2 with renal complications ([place]) 19/62/2297  . Essential hypertension 11/01/2009   Qualifier: Diagnosis of  By: Selena Batten CMA, [place]    . Gout 10/21/2018  . History of acute respiratory distress syndrome (ARDS) due to  COVID-19 virus   . History of cardiac arrest 04/27/2019  . History of non-ST elevation myocardial infarction (NSTEMI) 04/18/2019  . history of respiratory arrest   . Hyperlipidemia   . Hypertension   . Mitral valve regurgitation 09/19/2015   Echo 09/2015   . 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 OSA treated with BiPAP 06/17/2010  .  Sleep apnea    cpap nightly   Past Surgical History:  Procedure Laterality Date  . AV FISTULA PLACEMENT Left 09/27/2014  . INSERTION OF ARTERIOVENOUS (AV) ARTEGRAFT ARM Left 04/02/2018   Procedure: INSERTION OF ARTERIOVENOUS (AV) ARTEGRAFT ARM LEFT UPPER ARM;  Surgeon: Waynetta Sandy, MD;  Location: [facility];  Service: Vascular;  Laterality: Left;  . INSERTION OF DIALYSIS CATHETER N/A 04/02/2018   Procedure: INSERTION OF DIALYSIS CATHETER, right internal jugular;  Surgeon: Waynetta Sandy, MD;  Location: [facility];  Service: Vascular;  Laterality: N/A;  . NECK SURGERY     C6 & C7 replaced 30 yrs ago per pt  . REVISON OF ARTERIOVENOUS FISTULA Left 04/02/2018   Procedure: REVISION PLICATION OF ARTERIOVENOUS FISTULA ARM;  Surgeon: Waynetta Sandy, MD;  Location: [facility];  Service: Vascular;  Laterality: Left;  . WISDOM TOOTH EXTRACTION      No Known Allergies  Outpatient Encounter Medications as of 07/26/2019  Medication Sig  . acetaminophen (TYLENOL) 325 MG tablet Take 650 mg by mouth every 6 (six) hours as needed for mild pain.  [place] albuterol (VENTOLIN HFA) 108 (90 Base) MCG/ACT inhaler Inhale 1-2 puffs into the lungs every 6 (six) hours as needed for wheezing or shortness of breath.  . allopurinol (ZYLOPRIM) 100 MG tablet Take 100 mg by mouth 2 (two) times daily.   . Amino Acids-Protein Hydrolys (FEEDING SUPPLEMENT, PRO-STAT SUGAR FREE 64,) LIQD Take 30 mLs by mouth 2 (two) times daily.  [place] aspirin 81 MG EC tablet Take 81 mg by mouth daily.  T[place] atorvastatin (LIPITOR) 80 MG tablet Take 1 tablet (80 mg total) by mouth daily.  . B Complex-C-Folic Acid (NEPHRO-VITE PO) Take 1 tablet by mouth daily.  . bisacodyl (DULCOLAX) 10 MG suppository Place 10 mg rectally daily as needed for moderate constipation (If not relieved by MOM).  . carvedilol (COREG) 6.25 MG tablet Take 1 tablet (6.25 mg total) by mouth 2 (two) times daily with a meal.  . cetirizine (ZYRTEC) 5 MG tablet Take 1  tablet (5 mg total) by mouth daily.  . clonazePAM (KLONOPIN) 0.5 MG tablet Take 1 tablet (0.5 mg total) by mouth 2 (two) times daily.  [place] lidocaine (LIDODERM) 5 % Place 1 patch onto the skin daily. Remove & Discard patch within 12 hours or as directed by MD  . mirtazapine (REMERON) 15 MG tablet Take 1 tablet (15 mg total) by mouth at bedtime.  . polyethylene glycol (MIRALAX / GLYCOLAX) 17 g packet Take 17 g by mouth 2 (two) times daily as needed (For Constipation).  [place] senna (SENOKOT) 8.6 MG TABS tablet Take 1 tablet (8.6 mg total) by mouth daily.  . sevelamer carbonate (RENVELA) 800 MG tablet Take 800 mg by mouth 3 (three) times daily with meals.  . Sodium Phosphates (RA SALINE ENEMA RE) Place 1 each rectally daily as needed (If no BM after Bisacodyl).   No facility-administered encounter medications on file as of 07/26/2019.    Review of Systems  GENERAL: No change in appetite, no fatigue, no weight changes, no fever, chills or weakness MOUTH and THROAT: Denies oral discomfort, gingival pain  or bleeding, pain from teeth or hoarseness   RESPIRATORY: no cough, SOB, DOE, wheezing, hemoptysis CARDIAC: No chest pain, edema or palpitations GI: No abdominal pain, diarrhea, constipation, heart burn, nausea or vomiting NEUROLOGICAL: Denies dizziness, syncope, numbness, or headache PSYCHIATRIC: Denies feelings of depression or anxiety. No report of hallucinations, insomnia, paranoia, or agitation    Immunization History  Administered Date(s) Administered  . Hepatitis B, adult 10/21/2014, 11/18/2014, 12/19/2014, 04/17/2015, 07/15/2016  . Influenza,inj,Quad PF,6+ Mos 03/15/2019  . Influenza-Unspecified 03/06/2016, 02/12/2017  . Pneumococcal Conjugate-13 03/29/2016  . Pneumococcal Polysaccharide-23 03/29/2016, 04/25/2017   Pertinent  Health Maintenance Due  Topic Date Due  . URINE MICROALBUMIN  11/21/2017  . OPHTHALMOLOGY EXAM  12/26/2017  . FOOT EXAM  10/21/2019  . HEMOGLOBIN A1C  12/26/2019    . COLONOSCOPY  01/01/2021  . INFLUENZA VACCINE  Completed   Fall Risk  10/21/2018 02/04/2018 11/22/2016  Falls in the past year? 0 No No     Vitals:   07/26/19 1529  BP: 128/76  Pulse: 70  Resp: 20  Temp: 98.3 F (36.8 C)  TempSrc: Oral  SpO2: 98%  Weight: 206 lb 9.3 oz (93.7 kg)  Height: 5\' 10"  (1.778 m)   Body mass index is 29.64 kg/m.  Physical Exam  GENERAL APPEARANCE: Well nourished. In no acute distress. Obese SKIN:  Sacral ulcer with dressing MOUTH and THROAT: Lips are without lesions. Oral mucosa is moist and without lesions. Tongue is normal in shape, size, and color and without lesions RESPIRATORY: Breathing is even & unlabored, BS CTAB CARDIAC: RRR, no murmur,no extra heart sounds, no edema. Left upper arm AV Fistula +bruit/thrill GI: Abdomen soft, normal BS, no masses, no tenderness EXTREMITIES: Able to move X 4 extremities NEUROLOGICAL: There is no tremor. Speech is clear. Shakes/jerks while sitting down. PSYCHIATRIC:  Affect and behavior are appropriate  Labs reviewed: Recent Labs    05/12/19 0349 05/13/19 0618 05/19/19 2050 05/20/19 0228 06/28/19 0439 06/28/19 0439 06/29/19 0210 06/29/19 1117 06/30/19 1235  NA 134*   < > 135   < > 139   < > 140 140 138  K 4.8   < > 4.4   < > 4.2   < > 3.9 4.1 3.8  CL 100   < > 89*   < > 94*   < > 99 98 96*  CO2 20*   < > 21*   < > 31   < > 27 28 26   GLUCOSE 127*   < > 152*   < > 120*   < > 110* 137* 104*  BUN 68*   < > 89*   < > 30*   < > 26* 29* 39*  CREATININE 5.26*   < > 9.92*   < > 10.50*   < > 8.12* 9.22* 11.38*  CALCIUM 12.4*   < > 10.1   < > 10.1   < > 10.2 10.7* 10.5*  MG 3.2*  --  2.3  --  2.0  --   --   --   --   PHOS 7.2*   < >  --    < > 5.2*  --   --  4.5 4.6   < > = values in this interval not displayed.   Recent Labs    05/19/19 2050 05/21/19 0129 06/27/19 2224 06/27/19 2224 06/29/19 1117 06/30/19 1001 06/30/19 1235  AST 22  --  28  --   --  20  --   ALT 28  --  24  --   --  21  --    ALKPHOS 77  --  88  --   --  77  --   BILITOT 0.6  --  0.9  --   --  0.9  --   PROT 6.8  --  6.6  --   --  6.5  --   ALBUMIN 2.5*   < > 3.1*   < > 2.9* 3.0* 2.9*   < > = values in this interval not displayed.   Recent Labs    05/12/19 0349 05/13/19 0618 05/19/19 2050 05/20/19 0228 06/27/19 2224 06/28/19 0020 06/29/19 0210 06/30/19 1235 07/01/19 0217  WBC 25.3*   < > 17.5*   < > 10.2   < > 6.9 7.0 6.0  NEUTROABS 18.1*  --  14.4*  --  4.0  --   --   --   --   HGB 9.0*   < > 8.2*   < > 10.5*   < > 9.9* 9.9* 10.0*  HCT 27.7*   < > 24.9*   < > 35.9*   < > 31.8* 30.5* 31.9*  MCV 87.1   < > 84.1   < > 96.8   < > 89.6 88.2 88.4  PLT 687*   < > 366   < > 316   < > 310 317 307   < > = values in this interval not displayed.   Lab Results  Component Value Date   TSH 1.59 08/31/2015   Lab Results  Component Value Date   HGBA1C 5.1 06/28/2019   Lab Results  Component Value Date   CHOL 207 (H) 04/14/2019   HDL 20 (L) 04/14/2019   LDLCALC 144 (H) 04/14/2019   TRIG 100 06/27/2019   CHOLHDL 10.4 04/14/2019    Significant Diagnostic Results in last 30 days:  CTA Chest for PE  Result Date: 06/28/2019 CLINICAL DATA:  Shortness of breath EXAM: CT ANGIOGRAPHY CHEST WITH CONTRAST TECHNIQUE: Multidetector CT imaging of the chest was performed using the standard protocol during bolus administration of intravenous contrast. Multiplanar CT image reconstructions and MIPs were obtained to evaluate the vascular anatomy. CONTRAST:  65mL OMNIPAQUE IOHEXOL 350 MG/ML SOLN COMPARISON:  None. FINDINGS: Cardiovascular: Contrast injection is sufficient to demonstrate satisfactory opacification of the pulmonary arteries to the segmental level. There is no pulmonary embolus or evidence of right heart strain. The size of the main pulmonary artery is normal. Cardiomegaly with coronary artery calcification. The course and caliber of the aorta are normal. There is mild atherosclerotic calcification. Opacification  decreased due to pulmonary arterial phase contrast bolus timing. Mediastinum/Nodes: No mediastinal, hilar or axillary lymphadenopathy. The visualized thyroid and thoracic esophageal course are unremarkable. Lungs/Pleura: There is diffuse pulmonary edema. There is a small area of consolidation in the lingula. No pleural effusion. The airways are patent. Upper Abdomen: Contrast bolus timing is not optimized for evaluation of the abdominal organs. The visualized portions of the organs of the upper abdomen are normal. Musculoskeletal: No chest wall abnormality. No bony spinal canal stenosis. Review of the MIP images confirms the above findings. IMPRESSION: 1. No pulmonary embolus or acute aortic syndrome. 2. Diffuse pulmonary edema. Small area of consolidation in the lingula, which may indicate pneumonia. 3. Cardiomegaly and coronary artery calcification. Aortic Atherosclerosis (ICD10-I70.0). Electronically Signed   By: Ulyses Jarred M.D.   On: 06/28/2019 00:57   DG Chest Port 1 View  Result Date: 06/27/2019 CLINICAL DATA:  54 year old male COVID-19.  Shortness of breath and hypoxia. EXAM: PORTABLE CHEST 1 VIEW COMPARISON:  Portable chest 05/19/2019 and earlier. FINDINGS: Portable AP upright view at 2237 hours. Cardiomegaly suspected. Other mediastinal contours are within normal limits. Visualized tracheal air column is within normal limits. Left greater than right mixed interstitial and airspace opacity with retrocardiac air bronchograms. The right upper lung is relatively spared. No superimposed pneumothorax or pleural effusion. No acute osseous abnormality identified. IMPRESSION: 1. Left greater than right mixed pulmonary interstitial and airspace opacity compatible with COVID-19 pneumonia in this setting. 2. Cardiomegaly suspected. Electronically Signed   By: Genevie Ann M.D.   On: 06/27/2019 22:49   ECHOCARDIOGRAM COMPLETE  Result Date: 06/28/2019   ECHOCARDIOGRAM REPORT   Patient Name:   Troy Wells Date  of Exam: 06/28/2019 Medical Rec #:  [id number]        Height:       71.0 in Accession #:    [id number]       Weight:       222.2 lb Date of Birth:  19-Dec-1965        BSA:          2.21 m Patient Age:    44 years         BP:           129/106 mmHg Patient Gender: M                HR:           92 bpm. Exam Location:  Inpatient Procedure: 2D Echo and Strain Analysis Indications:    Cardiomyopathy-Ischemic 414.8 / I25.5  History:        Patient has prior history of Echocardiogram examinations, most                 recent 04/27/2019. Previous Myocardial Infarction and CAD,                 Signs/Symptoms:Dyspnea; Risk Factors:Hypertension, Dyslipidemia                 and Sleep Apnea. History of COVID-19. End stage renal disease.                 Anemia.  Sonographer:    Darlina Sicilian RDCS Referring Phys: [id number] [place]  1. Left ventricular ejection fraction, by visual estimation, is 30 to 35%. The left ventricle has normal function. There is moderately increased left ventricular hypertrophy.  2. Basal inferolateral segment, basal anterolateral segment, and basal inferior segment are abnormal.  3. Left ventricular diastolic parameters are consistent with Grade I diastolic dysfunction (impaired relaxation).  4. Mildly dilated left ventricular internal cavity size.  5. The left ventricle demonstrates global hypokinesis.  6. Global right ventricle has normal systolic function.The right ventricular size is normal. No increase in right ventricular wall thickness.  7. Left atrial size was moderately dilated.  8. Right atrial size was normal.  9. The mitral valve is normal in structure. Mild mitral valve regurgitation. No evidence of mitral stenosis. 10. The tricuspid valve is normal in structure. 11. The aortic valve is normal in structure. Aortic valve regurgitation is not visualized. No evidence of aortic valve sclerosis or stenosis. 12. The pulmonic valve was normal in structure. Pulmonic valve  regurgitation is not visualized. 13. The inferior vena cava is normal in size with greater than 50% respiratory variability, suggesting right atrial pressure of 3 mmHg. 14. The average left ventricular global longitudinal strain is -9.1 %. In  comparison to the previous echocardiogram(s): EF is reduced when compared to prior echocardiogram. FINDINGS  Left Ventricle: Left ventricular ejection fraction, by visual estimation, is 30 to 35%. The left ventricle has normal function. The average left ventricular global longitudinal strain is -9.1 %. The left ventricle demonstrates global hypokinesis. The left ventricular internal cavity size was mildly dilated left ventricle. There is moderately increased left ventricular hypertrophy. Left ventricular diastolic parameters are consistent with Grade I diastolic dysfunction (impaired relaxation). Normal left atrial pressure.  LV Wall Scoring: The basal inferolateral segment, basal anterolateral segment, and basal inferior segment are akinetic. Right Ventricle: The right ventricular size is normal. No increase in right ventricular wall thickness. Global RV systolic function is has normal systolic function. Left Atrium: Left atrial size was moderately dilated. Right Atrium: Right atrial size was normal in size Pericardium: There is no evidence of pericardial effusion. Mitral Valve: The mitral valve is normal in structure. Mild mitral valve regurgitation. No evidence of mitral valve stenosis by observation. Tricuspid Valve: The tricuspid valve is normal in structure. Tricuspid valve regurgitation is not demonstrated. Aortic Valve: The aortic valve is normal in structure. Aortic valve regurgitation is not visualized. The aortic valve is structurally normal, with no evidence of sclerosis or stenosis. Pulmonic Valve: The pulmonic valve was normal in structure. Pulmonic valve regurgitation is not visualized. Pulmonic regurgitation is not visualized. Aorta: The aortic root, ascending  aorta and aortic arch are all structurally normal, with no evidence of dilitation or obstruction. Venous: The inferior vena cava is normal in size with greater than 50% respiratory variability, suggesting right atrial pressure of 3 mmHg. IAS/Shunts: No atrial level shunt detected by color flow Doppler. There is no evidence of a patent foramen ovale. No ventricular septal defect is seen or detected. There is no evidence of an atrial septal defect.  LEFT VENTRICLE PLAX 2D LVIDd:         5.80 cm       Diastology LVIDs:         4.90 cm       LV e' lateral:   4.68 cm/s LV PW:         1.10 cm       LV E/e' lateral: 21.4 LV IVS:        1.60 cm       LV e' medial:    6.96 cm/s LVOT diam:     2.20 cm       LV E/e' medial:  14.4 LV SV:         54 ml LV SV Index:   23.64         2D Longitudinal Strain LVOT Area:     3.80 cm      2D Strain GLS Avg:     -9.1 %  LV Volumes (MOD) LV area d, A2C:    68.00 cm 3D Volume EF: LV area d, A4C:    61.80 cm 3D EF:        36 % LV area s, A2C:    44.30 cm LV EDV:       317 ml LV area s, A4C:    51.10 cm LV ESV:       204 ml LV major d, A2C:   11.90 cm  LV SV:        113 ml LV major d, A4C:   11.30 cm LV major s, A2C:   10.30 cm LV major s, A4C:   11.00 cm LV vol d,  MOD A2C: 330.0 ml LV vol d, MOD A4C: 282.0 ml LV vol s, MOD A2C: 172.0 ml LV vol s, MOD A4C: 207.0 ml LV SV MOD A2C:     158.0 ml LV SV MOD A4C:     282.0 ml LV SV MOD BP:      117.2 ml RIGHT VENTRICLE RV S prime:     14.50 cm/s TAPSE (M-mode): 2.1 cm LEFT ATRIUM              Index       RIGHT ATRIUM           Index LA diam:        4.60 cm  2.09 cm/m  RA Area:     20.00 cm LA Vol (A2C):   100.0 ml 45.35 ml/m RA Volume:   56.70 ml  25.71 ml/m LA Vol (A4C):   96.1 ml  43.58 ml/m LA Biplane Vol: 99.1 ml  44.94 ml/m  AORTIC VALVE LVOT Vmax:   120.00 cm/s LVOT Vmean:  83.900 cm/s LVOT VTI:    0.201 m  AORTA Ao Root diam: 3.40 cm MITRAL VALVE MV Area (PHT): 4.80 cm              SHUNTS MV PHT:        45.82 msec             Systemic VTI:  0.20 m MV Decel Time: 158 msec              Systemic Diam: 2.20 cm MV E velocity: 100.00 cm/s 103 cm/s MV A velocity: 116.00 cm/s 70.3 cm/s MV E/A ratio:  0.86        1.5  Candee Furbish MD Electronically signed by Candee Furbish MD Signature Date/Time: 06/28/2019/12:55:04 PM    Final    ECHOCARDIOGRAM LIMITED  Result Date: 06/30/2019   ECHOCARDIOGRAM LIMITED REPORT   Patient Name:   MEYER ARORA Date of Exam: 06/30/2019 Medical Rec #:  [id number]        Height:       71.0 in Accession #:    [id number]       Weight:       218.7 lb Date of Birth:  April 16, 1966        BSA:          2.19 m Patient Age:    83 years         BP:           140/91 mmHg Patient Gender: M                HR:           80 bpm. Exam Location:  Inpatient  Procedure: Limited Echo Indications:    Endocarditis I38  History:        Patient has prior history of Echocardiogram examinations, most                 recent 06/28/2019. History of Covid positive10/2020, Coronary                 atherosclerosis, PHTN.  Sonographer:    Leavy Cella Referring Phys: [id number] [place]  1. Left ventricular ejection fraction, by visual estimation, is 30%. The left ventricle has moderate to severely decreased function. There is mildly increased left ventricular wall thickness.  2. Left ventricular diastolic parameters are consistent with Grade I diastolic dysfunction (impaired relaxation).  3. Severely dilated left ventricular internal cavity size.  4.  The left ventricle demonstrates regional wall motion abnormalities. Inferolateral akinesis, severe inferior hypokinesis, severe anterolateral hypokinesis.  5. Global right ventricle has normal systolc function.The right ventricular size is normal. no increase in right ventricular wall thickness.  6. Mild mitral annular calcification.  7. The mitral valve is normal in structure. Mild mitral valve regurgitation. No evidence of mitral stenosis.  8. No evidence of aortic valve sclerosis or  stenosis.  9. TR signal is inadequate for assessing pulmonary artery systolic pressure. 10. Tricuspid valve regurgitation is not demonstrated. 11. The IVC was not visualized. FINDINGS  Left Ventricle: Left ventricular ejection fraction, by visual estimation, is 30%. The left ventricle has moderate to severely decreased function. The left ventricle demonstrates regional wall motion abnormalities. The left ventricular internal cavity size was severely dilated left ventricle. There is mildly increased left ventricular wall thickness. Left ventricular diastolic parameters are consistent with Grade I diastolic dysfunction (impaired relaxation). Right Ventricle: The right ventricular size is normal. No increase in right ventricular wall thickness. Global RV systolic function is has normal systolic function. Left Atrium: Left atrial size was normal in size. Right Atrium: Right atrial size was normal in size. Pericardium: There is no evidence of pericardial effusion is seen. There is no evidence of pericardial effusion. Mitral Valve: The mitral valve is normal in structure. There is mild calcification of the mitral valve leaflet(s). Mild mitral annular calcification. No evidence of mitral valve stenosis by observation. Mild mitral valve regurgitation. Tricuspid Valve: The tricuspid valve is normal in structure. Tricuspid valve regurgitation is not demonstrated. Aortic Valve: The aortic valve is tricuspid. Aortic valve regurgitation is not visualized. The aortic valve is structurally normal, with no evidence of sclerosis or stenosis. Pulmonic Valve: The pulmonic valve was normal in structure. Pulmonic valve regurgitation is not visualized by color flow Doppler. Pulmonic regurgitation is not visualized by color flow Doppler. Venous: The inferior vena cava was not well visualized. Shunts: No atrial level shunt detected by color flow Doppler.  Loralie Champagne MD Electronically signed by Loralie Champagne MD Signature Date/Time:  06/30/2019/3:50:37 PMThe mitral valve is normal in structure.    Final     Assessment/Plan  1. Advance care planning -Continues to be full code -Discussed mobility concerns at home since resident has jerky movements, daughter would like to have teaching sessions on how to help resident during transfers -Resident and daughter aware of what are the current medications - awaiting [facility] transportation approval  2. Myoclonic jerking -Continue clonazepam, fall precautions  3. History of gout -Continue allopurinol  4. Type 2 diabetes mellitus with chronic kidney disease on chronic dialysis, without long-term current use of insulin ([place]) Lab Results  Component Value Date   HGBA1C 5.1 06/28/2019  -Not on any medications, diet controlled -Hemodialysis on MWF -Continue sevelamer  5. Hypertension associated with diabetes ([place]) -Stable, continue carvedilol  6. Mixed hyperlipidemia Lab Results  Component Value Date   CHOL 207 (H) 04/14/2019   HDL 20 (L) 04/14/2019   LDLCALC 144 (H) 04/14/2019   TRIG 100 06/27/2019   CHOLHDL 10.4 04/14/2019   -Continue atorvastatin  7. 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  A walker will not resolve issue with performing activities of daily living.  A wheelchair will allow patient to safely perform daily activities.  He has a caregiver who can provide assistance wheelchair mobility.  Semi-electric hospital bed - He has myoclonic jerking which requires upper and lower extremities and torso  to be be positioned in ways not feasible with a normal bed. Head must be elevated at least 30 degrees in order to prevent aspiration. Myoclonic jerking requires frequent and immediate changes in body position which cannot be achieved with a normal bed.   Family/ staff Communication: Discussed plan of care with resident, daughter and IDT.  Labs/tests ordered:  None  Goals of care:   Short-term rehabilitation.   Durenda Age, DNP, FNP-BC [facility] and Adult Medicine [phone] (Monday-Friday 8:00 a.m. - 5:00 p.m.) [phone] (after hours)

## 2019-07-28 DIAGNOSIS — E1121 Type 2 diabetes mellitus with diabetic nephropathy: Secondary | ICD-10-CM | POA: Diagnosis not present

## 2019-07-28 DIAGNOSIS — N2581 Secondary hyperparathyroidism of renal origin: Secondary | ICD-10-CM | POA: Diagnosis not present

## 2019-07-28 DIAGNOSIS — N186 End stage renal disease: Secondary | ICD-10-CM | POA: Diagnosis not present

## 2019-07-28 DIAGNOSIS — Z992 Dependence on renal dialysis: Secondary | ICD-10-CM | POA: Diagnosis not present

## 2019-07-30 DIAGNOSIS — N2581 Secondary hyperparathyroidism of renal origin: Secondary | ICD-10-CM | POA: Diagnosis not present

## 2019-07-30 DIAGNOSIS — E1121 Type 2 diabetes mellitus with diabetic nephropathy: Secondary | ICD-10-CM | POA: Diagnosis not present

## 2019-07-30 DIAGNOSIS — Z992 Dependence on renal dialysis: Secondary | ICD-10-CM | POA: Diagnosis not present

## 2019-07-30 DIAGNOSIS — N186 End stage renal disease: Secondary | ICD-10-CM | POA: Diagnosis not present

## 2019-08-02 DIAGNOSIS — N186 End stage renal disease: Secondary | ICD-10-CM | POA: Diagnosis not present

## 2019-08-02 DIAGNOSIS — Z992 Dependence on renal dialysis: Secondary | ICD-10-CM | POA: Diagnosis not present

## 2019-08-02 DIAGNOSIS — N2581 Secondary hyperparathyroidism of renal origin: Secondary | ICD-10-CM | POA: Diagnosis not present

## 2019-08-02 DIAGNOSIS — E1121 Type 2 diabetes mellitus with diabetic nephropathy: Secondary | ICD-10-CM | POA: Diagnosis not present

## 2019-08-03 ENCOUNTER — Encounter: Payer: Self-pay | Admitting: Adult Health

## 2019-08-03 ENCOUNTER — Non-Acute Institutional Stay (SKILLED_NURSING_FACILITY): Payer: Medicare Other | Admitting: Adult Health

## 2019-08-03 DIAGNOSIS — G4733 Obstructive sleep apnea (adult) (pediatric): Secondary | ICD-10-CM

## 2019-08-03 DIAGNOSIS — I2510 Atherosclerotic heart disease of native coronary artery without angina pectoris: Secondary | ICD-10-CM

## 2019-08-03 DIAGNOSIS — B957 Other staphylococcus as the cause of diseases classified elsewhere: Secondary | ICD-10-CM | POA: Diagnosis not present

## 2019-08-03 DIAGNOSIS — G253 Myoclonus: Secondary | ICD-10-CM

## 2019-08-03 DIAGNOSIS — R7881 Bacteremia: Secondary | ICD-10-CM

## 2019-08-03 DIAGNOSIS — R630 Anorexia: Secondary | ICD-10-CM

## 2019-08-03 DIAGNOSIS — L89154 Pressure ulcer of sacral region, stage 4: Secondary | ICD-10-CM

## 2019-08-03 DIAGNOSIS — Z8739 Personal history of other diseases of the musculoskeletal system and connective tissue: Secondary | ICD-10-CM

## 2019-08-03 DIAGNOSIS — N186 End stage renal disease: Secondary | ICD-10-CM | POA: Diagnosis not present

## 2019-08-03 DIAGNOSIS — M545 Low back pain, unspecified: Secondary | ICD-10-CM

## 2019-08-03 DIAGNOSIS — G8929 Other chronic pain: Secondary | ICD-10-CM

## 2019-08-03 DIAGNOSIS — E1159 Type 2 diabetes mellitus with other circulatory complications: Secondary | ICD-10-CM | POA: Diagnosis not present

## 2019-08-03 DIAGNOSIS — I10 Essential (primary) hypertension: Secondary | ICD-10-CM

## 2019-08-03 DIAGNOSIS — E782 Mixed hyperlipidemia: Secondary | ICD-10-CM

## 2019-08-03 DIAGNOSIS — Z992 Dependence on renal dialysis: Secondary | ICD-10-CM

## 2019-08-03 MED ORDER — CARVEDILOL 6.25 MG PO TABS: 6.2500 mg | ORAL_TABLET | Freq: Two times a day (BID) | ORAL | 0 refills | Status: DC

## 2019-08-03 MED ORDER — LIDOCAINE 5 % EX PTCH: 1.0000 | MEDICATED_PATCH | CUTANEOUS | 0 refills | Status: DC

## 2019-08-03 MED ORDER — ALLOPURINOL 100 MG PO TABS: 100.0000 mg | ORAL_TABLET | Freq: Two times a day (BID) | ORAL | 0 refills | Status: DC

## 2019-08-03 MED ORDER — SEVELAMER CARBONATE 800 MG PO TABS: 800.0000 mg | ORAL_TABLET | Freq: Three times a day (TID) | ORAL | 0 refills | Status: AC

## 2019-08-03 MED ORDER — CETIRIZINE HCL 5 MG PO TABS: 5.0000 mg | ORAL_TABLET | Freq: Every day | ORAL | 0 refills | Status: DC

## 2019-08-03 MED ORDER — PRO-STAT SUGAR FREE PO LIQD: 30.0000 mL | Freq: Two times a day (BID) | ORAL | 0 refills | Status: DC

## 2019-08-03 MED ORDER — LIDOCAINE 5 % EX PTCH: 1.0000 | MEDICATED_PATCH | CUTANEOUS | 0 refills | Status: AC

## 2019-08-03 MED ORDER — CLONAZEPAM 0.5 MG PO TABS: 0.5000 mg | ORAL_TABLET | Freq: Two times a day (BID) | ORAL | 0 refills | Status: DC

## 2019-08-03 MED ORDER — ATORVASTATIN CALCIUM 80 MG PO TABS: 80.0000 mg | ORAL_TABLET | Freq: Every day | ORAL | 0 refills | Status: DC

## 2019-08-03 MED ORDER — MIRTAZAPINE 15 MG PO TABS: 15.0000 mg | ORAL_TABLET | Freq: Every day | ORAL | 0 refills | Status: DC

## 2019-08-03 MED ORDER — ALBUTEROL SULFATE HFA 108 (90 BASE) MCG/ACT IN AERS: 1.0000 | INHALATION_SPRAY | Freq: Four times a day (QID) | RESPIRATORY_TRACT | 0 refills | Status: DC | PRN

## 2019-08-03 MED ORDER — ALBUTEROL SULFATE HFA 108 (90 BASE) MCG/ACT IN AERS: 1.0000 | INHALATION_SPRAY | Freq: Four times a day (QID) | RESPIRATORY_TRACT | 0 refills | Status: AC | PRN

## 2019-08-03 MED ORDER — NEPHRO-VITE 0.8 MG PO TABS: 1.0000 | ORAL_TABLET | Freq: Every day | ORAL | 0 refills | Status: AC

## 2019-08-03 MED ORDER — SEVELAMER CARBONATE 800 MG PO TABS: 800.0000 mg | ORAL_TABLET | Freq: Three times a day (TID) | ORAL | 0 refills | Status: DC

## 2019-08-03 NOTE — Progress Notes (Signed)
Location:  [place] Room Number: 123/A Place of Service:  SNF (31) Provider:  Durenda Age, DNP, FNP-BC  Patient Care Team: [name], MD as PCP - General (Family Medicine) [name], MD as Referring Physician (Specialist)  Extended Emergency Contact Information Primary Emergency Contact: Berthold Mobile Phone: [phone] Relation: Daughter Secondary Emergency Contact: Bonney Roussel Mobile Phone: [phone] Relation: Sister  Code Status:  Full Code  Goals of care: Advanced Directive information Advanced Directives 08/03/2019  Does Patient Have a Medical Advance Directive? Yes  Type of Advance Directive (No Data)  Does patient want to make changes to medical advance directive? No - Patient declined  Copy of [place] in Chart? -  Would patient like information on creating a medical advance directive? -     Chief Complaint  Patient presents with  . Discharge Note    Discharge Visit    HPI:  Pt is a 54 y.o. male who is for discharge home with Home health PT, OT, CNA and Nurse for sacral wound treatment.  He was admitted to [facility] on 07/01/19 post hospitalization 06/27/19 to 07/01/19. He has PMH of CAD, ESRD on hemodialysis MWF, hypertension, diabetes, recent prolonged hospitalization 04/14/19 to 06/16/19 for COVID-19 pneumonia and ARDS. He was treated with Vancomycin and Cefepime. He was hypoxic and was treated with oxygen supplementation and BiPAP. 3/4 blood cultures in the hospital was positive for coag negative staph. He was treated with a total of 14 days Vancomycin with HD and completed Cefepime in the hospital. He had worsening myoclonus and neurology was consulted who recommended 2 HD sessions in a row. Clonazepam 0.5 mg BID was, also, started. Myoclonus was thought to be related to the low oxygen levels on admission.  Patient was admitted to this facility for  short-term rehabilitation after the patient's recent hospitalization.  Patient has completed SNF rehabilitation and therapy has cleared the patient for discharge.   Past Medical History:  Diagnosis Date  . CARDIAC ARREST 11/01/2009   Qualifier: Diagnosis of  By: Selena Batten CMA, [place]    . Colon polyps 01/02/2016   Colonoscopy July 2017 - One 3 mm polyp in the transverse colon, removed with a cold biopsy forceps. Resected and retrieved. - One 3 mm polyp in the rectum, removed with a cold biopsy forceps. Resected and retrieved. - Diverticulosis in the entire examined colon. - Non-bleeding internal hemorrhoids. - The examination was otherwise normal. - Significant looping which prolonged cecal    . Coronary artery disease 08/15/2009   with AMI  . Coronary atherosclerosis 11/01/2009   Qualifier: Diagnosis of  By: Selena Batten CMA, Jewel    . Dialysis patient ([facility]) 09/27/2014   mon, wed, and fri  . Diverticulosis of colon without hemorrhage 01/02/2016   Colonoscopy July 2017 - One 3 mm polyp in the transverse colon, removed with a cold biopsy forceps. Resected and retrieved. - One 3 mm polyp in the rectum, removed with a cold biopsy forceps. Resected and retrieved. - Diverticulosis in the entire examined colon. - Non-bleeding internal hemorrhoids. - The examination was otherwise normal. - Significant looping which prolonged cecal    . DM (diabetes mellitus), type 2 with renal complications ([place]) 55/73/2202  . Essential hypertension 11/01/2009   Qualifier: Diagnosis of  By: Selena Batten CMA, [place]    . Gout 10/21/2018  . History of acute respiratory distress syndrome (ARDS) due to COVID-19 virus   . History of cardiac arrest 04/27/2019  . History  of non-ST elevation myocardial infarction (NSTEMI) 04/18/2019  . history of respiratory arrest   . Hyperlipidemia   . Hypertension   . Mitral valve regurgitation 09/19/2015   Echo 09/2015   . 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 OSA treated with BiPAP 06/17/2010    . Sleep apnea    cpap nightly   Past Surgical History:  Procedure Laterality Date  . AV FISTULA PLACEMENT Left 09/27/2014  . INSERTION OF ARTERIOVENOUS (AV) ARTEGRAFT ARM Left 04/02/2018   Procedure: INSERTION OF ARTERIOVENOUS (AV) ARTEGRAFT ARM LEFT UPPER ARM;  Surgeon: Waynetta Sandy, MD;  Location: [facility];  Service: Vascular;  Laterality: Left;  . INSERTION OF DIALYSIS CATHETER N/A 04/02/2018   Procedure: INSERTION OF DIALYSIS CATHETER, right internal jugular;  Surgeon: Waynetta Sandy, MD;  Location: [facility];  Service: Vascular;  Laterality: N/A;  . NECK SURGERY     C6 & C7 replaced 30 yrs ago per pt  . REVISON OF ARTERIOVENOUS FISTULA Left 04/02/2018   Procedure: REVISION PLICATION OF ARTERIOVENOUS FISTULA ARM;  Surgeon: Waynetta Sandy, MD;  Location: [facility];  Service: Vascular;  Laterality: Left;  . WISDOM TOOTH EXTRACTION      No Known Allergies  Outpatient Encounter Medications as of 08/03/2019  Medication Sig  . acetaminophen (TYLENOL) 325 MG tablet Take 650 mg by mouth every 6 (six) hours as needed for mild pain.  Marland Kitchen albuterol (VENTOLIN HFA) 108 (90 Base) MCG/ACT inhaler Inhale 1-2 puffs into the lungs every 6 (six) hours as needed for wheezing or shortness of breath.  . allopurinol (ZYLOPRIM) 100 MG tablet Take 100 mg by mouth 2 (two) times daily.   . Amino Acids-Protein Hydrolys (FEEDING SUPPLEMENT, PRO-STAT SUGAR FREE 64,) LIQD Take 30 mLs by mouth 2 (two) times daily.  Marland Kitchen aspirin 81 MG EC tablet Take 81 mg by mouth daily.  Marland Kitchen atorvastatin (LIPITOR) 80 MG tablet Take 1 tablet (80 mg total) by mouth daily.  . B Complex-C-Folic Acid (NEPHRO-VITE PO) Take 1 tablet by mouth daily.  . bisacodyl (DULCOLAX) 10 MG suppository Place 10 mg rectally daily as needed for moderate constipation (If not relieved by MOM).  . carvedilol (COREG) 6.25 MG tablet Take 6.25 mg by mouth 2 (two) times daily with a meal.  . cetirizine (ZYRTEC) 5 MG tablet Take 1 tablet (5 mg  total) by mouth daily.  . clonazePAM (KLONOPIN) 0.5 MG tablet Take 1 tablet (0.5 mg total) by mouth 2 (two) times daily.  Marland Kitchen lidocaine (LIDODERM) 5 % Place 1 patch onto the skin daily. Remove & Discard patch within 12 hours or as directed by MD  . mirtazapine (REMERON) 15 MG tablet Take 1 tablet (15 mg total) by mouth at bedtime.  . polyethylene glycol (MIRALAX / GLYCOLAX) 17 g packet Take 17 g by mouth 2 (two) times daily as needed (For Constipation).  Marland Kitchen senna (SENOKOT) 8.6 MG TABS tablet Take 1 tablet (8.6 mg total) by mouth daily.  . sevelamer carbonate (RENVELA) 800 MG tablet Take 800 mg by mouth 3 (three) times daily with meals.  . Sodium Phosphates (RA SALINE ENEMA RE) Place 1 each rectally daily as needed (If no BM after Bisacodyl).  . [DISCONTINUED] carvedilol (COREG) 6.25 MG tablet Take 1 tablet (6.25 mg total) by mouth 2 (two) times daily with a meal.   No facility-administered encounter medications on file as of 08/03/2019.    Review of Systems  GENERAL: No change in appetite, no fatigue, no weight changes, no fever, chills or weakness  MOUTH and THROAT: Denies oral discomfort, gingival pain or bleeding RESPIRATORY: no cough, SOB, DOE, wheezing, hemoptysis CARDIAC: No chest pain, edema or palpitations GI: No abdominal pain, diarrhea, constipation, heart burn, nausea or vomiting NEUROLOGICAL: Denies dizziness, syncope, numbness, or headache PSYCHIATRIC: Denies feelings of depression or anxiety. No report of hallucinations, insomnia, paranoia, or agitation   Immunization History  Administered Date(s) Administered  . Hepatitis B, adult 10/21/2014, 11/18/2014, 12/19/2014, 04/17/2015, 07/15/2016  . Influenza,inj,Quad PF,6+ Mos 03/15/2019  . Influenza-Unspecified 03/06/2016, 02/12/2017  . Pneumococcal Conjugate-13 03/29/2016  . Pneumococcal Polysaccharide-23 03/29/2016, 04/25/2017   Pertinent  Health Maintenance Due  Topic Date Due  . URINE MICROALBUMIN  11/21/2017  .  OPHTHALMOLOGY EXAM  12/26/2017  . FOOT EXAM  10/21/2019  . HEMOGLOBIN A1C  12/26/2019  . COLONOSCOPY  01/01/2021  . INFLUENZA VACCINE  Completed   Fall Risk  10/21/2018 02/04/2018 11/22/2016  Falls in the past year? 0 No No     Vitals:   08/03/19 1027  BP: 114/68  Pulse: 72  Resp: 18  Temp: 98 F (36.7 C)  TempSrc: Oral  SpO2: 98%  Weight: 217 lb 5.8 oz (98.6 kg)  Height: 5\' 10"  (1.778 m)   Body mass index is 31.19 kg/m.  Physical Exam  GENERAL APPEARANCE: Well nourished. In no acute distress. Obese SKIN:  Sacral wound.  MOUTH and THROAT: Lips are without lesions. Oral mucosa is moist and without lesions. Tongue is normal in shape, size, and color and without lesions RESPIRATORY: Breathing is even & unlabored, BS CTAB CARDIAC: RRR, no murmur,no extra heart sounds, no edema. Left upper arm AV fistula GI: Abdomen soft, normal BS, no masses, no tenderness EXTREMITIES:  Able to move X 4 extremities NEUROLOGICAL: + tremor/jerking. Speech is clear. Alert and oriented X 3. PSYCHIATRIC:  Affect and behavior are appropriate  Labs reviewed: Recent Labs    05/12/19 0349 05/13/19 0618 05/19/19 2050 05/20/19 0228 06/28/19 0439 06/28/19 0439 06/29/19 0210 06/29/19 1117 06/30/19 1235  NA 134*   < > 135   < > 139   < > 140 140 138  K 4.8   < > 4.4   < > 4.2   < > 3.9 4.1 3.8  CL 100   < > 89*   < > 94*   < > 99 98 96*  CO2 20*   < > 21*   < > 31   < > 27 28 26   GLUCOSE 127*   < > 152*   < > 120*   < > 110* 137* 104*  BUN 68*   < > 89*   < > 30*   < > 26* 29* 39*  CREATININE 5.26*   < > 9.92*   < > 10.50*   < > 8.12* 9.22* 11.38*  CALCIUM 12.4*   < > 10.1   < > 10.1   < > 10.2 10.7* 10.5*  MG 3.2*  --  2.3  --  2.0  --   --   --   --   PHOS 7.2*   < >  --    < > 5.2*  --   --  4.5 4.6   < > = values in this interval not displayed.   Recent Labs    05/19/19 2050 05/21/19 0129 06/27/19 2224 06/27/19 2224 06/29/19 1117 06/30/19 1001 06/30/19 1235  AST 22  --  28  --   --   20  --   ALT 28  --  24  --   --  21  --   ALKPHOS 77  --  88  --   --  77  --   BILITOT 0.6  --  0.9  --   --  0.9  --   PROT 6.8  --  6.6  --   --  6.5  --   ALBUMIN 2.5*   < > 3.1*   < > 2.9* 3.0* 2.9*   < > = values in this interval not displayed.   Recent Labs    05/12/19 0349 05/13/19 0618 05/19/19 2050 05/20/19 0228 06/27/19 2224 06/28/19 0020 06/29/19 0210 06/30/19 1235 07/01/19 0217  WBC 25.3*   < > 17.5*   < > 10.2   < > 6.9 7.0 6.0  NEUTROABS 18.1*  --  14.4*  --  4.0  --   --   --   --   HGB 9.0*   < > 8.2*   < > 10.5*   < > 9.9* 9.9* 10.0*  HCT 27.7*   < > 24.9*   < > 35.9*   < > 31.8* 30.5* 31.9*  MCV 87.1   < > 84.1   < > 96.8   < > 89.6 88.2 88.4  PLT 687*   < > 366   < > 316   < > 310 317 307   < > = values in this interval not displayed.   Lab Results  Component Value Date   TSH 1.59 08/31/2015   Lab Results  Component Value Date   HGBA1C 5.1 06/28/2019   Lab Results  Component Value Date   CHOL 207 (H) 04/14/2019   HDL 20 (L) 04/14/2019   LDLCALC 144 (H) 04/14/2019   TRIG 100 06/27/2019   CHOLHDL 10.4 04/14/2019     Assessment/Plan  1. Coagulase negative Staphylococcus bacteremia - completed Cefepime and Vancomycin, resolved  2. Myoclonic jerking - clonazePAM (KLONOPIN) 0.5 MG tablet; Take 1 tablet (0.5 mg total) by mouth 2 (two) times daily.  Dispense: 60 tablet; Refill: 0  3. History of gout - allopurinol (ZYLOPRIM) 100 MG tablet; Take 1 tablet (100 mg total) by mouth 2 (two) times daily.  Dispense: 60 tablet; Refill: 0  4. Atherosclerosis of coronary artery without angina pectoris, unspecified vessel or lesion type, unspecified whether native or transplanted heart - continue ASA EC 81 mg daily and Atorvastatin 80 mg 1 tab daily  5. Hypertension associated with diabetes ([place]) - carvedilol (COREG) 6.25 MG tablet; Take 1 tablet (6.25 mg total) by mouth 2 (two) times daily with a meal.  Dispense: 60 tablet; Refill: 0  6. Mixed  hyperlipidemia - atorvastatin (LIPITOR) 80 MG tablet; Take 1 tablet (80 mg total) by mouth daily.  Dispense: 30 tablet; Refill: 0  7. Sacral decubitus ulcer, stage IV (HCC) - Irrigate sacral wound with NS, pack with silver alginate and apply secondary dressing daily  8. OSA treated with BiPAP - albuterol (VENTOLIN HFA) 108 (90 Base) MCG/ACT inhaler; Inhale 1-2 puffs into the lungs every 6 (six) hours as needed for wheezing or shortness of breath.  Dispense: 6.7 g; Refill: 0  9. Chronic midline low back pain without sciatica - lidocaine (LIDODERM) 5 %; Place 1 patch onto the skin daily. Remove & Discard patch within 12 hours or as directed by MD  Dispense: 30 patch; Refill: 0  10. End-stage renal disease on hemodialysis (HCC) - sevelamer carbonate (RENVELA) 800 MG tablet; Take 1 tablet (800 mg total) by  mouth 3 (three) times daily with meals.  Dispense: 90 tablet; Refill: 0  11. Poor appetite - b complex-vitamin c-folic acid (NEPHRO-VITE) 0.8 MG TABS tablet; Take 1 tablet by mouth daily.  Dispense: 30 tablet; Refill: 0 - mirtazapine (REMERON) 15 MG tablet; Take 1 tablet (15 mg total) by mouth at bedtime.  Dispense: 30 tablet; Refill: 0 - Amino Acids-Protein Hydrolys (FEEDING SUPPLEMENT, PRO-STAT SUGAR FREE 64,) LIQD; Take 30 mLs by mouth 2 (two) times daily.  Dispense: 1688 mL; Refill: 0    I have filled out patient's discharge paperwork and written prescriptions.  Patient will receive home health PT, OT, Nursing for wound management and CNA.  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 A walker will not resolve issue with performing activities of daily living. A wheelchair will allow patient to safely perform daily activities. He has a caregiver who can provide  assistance for wheelchair mobility.  Semi-electric hospital bed - He has myoclonic jerking which requires upper and lower extremities and torso to be positioned in ways not feasible with a normal bed. Head must be elevated at least 30 degrees in order to prevent aspiration. Myoclonic jerking requires frequent and immediate changes in body position which cannot be achieved with a normal bed.  Total discharge time: Greater than 30 minutes Greater than 50% was spent in counseling and coordination of care.  Discharge time involved coordination of the discharge process with social worker, nursing staff and therapy department. Medical justification for home health services/DME verified.   Durenda Age, DNP, FNP-BC [facility] and Adult Medicine [phone] (Monday-Friday 8:00 a.m. - 5:00 p.m.) [phone] (after hours)

## 2019-08-04 DIAGNOSIS — E1121 Type 2 diabetes mellitus with diabetic nephropathy: Secondary | ICD-10-CM | POA: Diagnosis not present

## 2019-08-04 DIAGNOSIS — Z992 Dependence on renal dialysis: Secondary | ICD-10-CM | POA: Diagnosis not present

## 2019-08-04 DIAGNOSIS — N2581 Secondary hyperparathyroidism of renal origin: Secondary | ICD-10-CM | POA: Diagnosis not present

## 2019-08-04 DIAGNOSIS — N186 End stage renal disease: Secondary | ICD-10-CM | POA: Diagnosis not present

## 2019-08-06 DIAGNOSIS — G4733 Obstructive sleep apnea (adult) (pediatric): Secondary | ICD-10-CM | POA: Diagnosis not present

## 2019-08-06 DIAGNOSIS — E1140 Type 2 diabetes mellitus with diabetic neuropathy, unspecified: Secondary | ICD-10-CM | POA: Diagnosis not present

## 2019-08-06 DIAGNOSIS — E1122 Type 2 diabetes mellitus with diabetic chronic kidney disease: Secondary | ICD-10-CM | POA: Diagnosis not present

## 2019-08-06 DIAGNOSIS — G931 Anoxic brain damage, not elsewhere classified: Secondary | ICD-10-CM | POA: Diagnosis not present

## 2019-08-06 DIAGNOSIS — I2510 Atherosclerotic heart disease of native coronary artery without angina pectoris: Secondary | ICD-10-CM | POA: Diagnosis not present

## 2019-08-06 DIAGNOSIS — Z8674 Personal history of sudden cardiac arrest: Secondary | ICD-10-CM | POA: Diagnosis not present

## 2019-08-06 DIAGNOSIS — I340 Nonrheumatic mitral (valve) insufficiency: Secondary | ICD-10-CM | POA: Diagnosis not present

## 2019-08-06 DIAGNOSIS — G253 Myoclonus: Secondary | ICD-10-CM | POA: Diagnosis not present

## 2019-08-06 DIAGNOSIS — I132 Hypertensive heart and chronic kidney disease with heart failure and with stage 5 chronic kidney disease, or end stage renal disease: Secondary | ICD-10-CM | POA: Diagnosis not present

## 2019-08-06 DIAGNOSIS — Z8701 Personal history of pneumonia (recurrent): Secondary | ICD-10-CM | POA: Diagnosis not present

## 2019-08-06 DIAGNOSIS — I252 Old myocardial infarction: Secondary | ICD-10-CM | POA: Diagnosis not present

## 2019-08-06 DIAGNOSIS — N2581 Secondary hyperparathyroidism of renal origin: Secondary | ICD-10-CM | POA: Diagnosis not present

## 2019-08-06 DIAGNOSIS — R41841 Cognitive communication deficit: Secondary | ICD-10-CM | POA: Diagnosis not present

## 2019-08-06 DIAGNOSIS — I272 Pulmonary hypertension, unspecified: Secondary | ICD-10-CM | POA: Diagnosis not present

## 2019-08-06 DIAGNOSIS — E1121 Type 2 diabetes mellitus with diabetic nephropathy: Secondary | ICD-10-CM | POA: Diagnosis not present

## 2019-08-06 DIAGNOSIS — Z8616 Personal history of COVID-19: Secondary | ICD-10-CM | POA: Diagnosis not present

## 2019-08-06 DIAGNOSIS — E782 Mixed hyperlipidemia: Secondary | ICD-10-CM | POA: Diagnosis not present

## 2019-08-06 DIAGNOSIS — I5032 Chronic diastolic (congestive) heart failure: Secondary | ICD-10-CM | POA: Diagnosis not present

## 2019-08-06 DIAGNOSIS — K5730 Diverticulosis of large intestine without perforation or abscess without bleeding: Secondary | ICD-10-CM | POA: Diagnosis not present

## 2019-08-06 DIAGNOSIS — M109 Gout, unspecified: Secondary | ICD-10-CM | POA: Diagnosis not present

## 2019-08-06 DIAGNOSIS — Z86010 Personal history of colonic polyps: Secondary | ICD-10-CM | POA: Diagnosis not present

## 2019-08-06 DIAGNOSIS — L89154 Pressure ulcer of sacral region, stage 4: Secondary | ICD-10-CM | POA: Diagnosis not present

## 2019-08-06 DIAGNOSIS — E1159 Type 2 diabetes mellitus with other circulatory complications: Secondary | ICD-10-CM | POA: Diagnosis not present

## 2019-08-06 DIAGNOSIS — N186 End stage renal disease: Secondary | ICD-10-CM | POA: Diagnosis not present

## 2019-08-06 DIAGNOSIS — Z9181 History of falling: Secondary | ICD-10-CM | POA: Diagnosis not present

## 2019-08-06 DIAGNOSIS — Z992 Dependence on renal dialysis: Secondary | ICD-10-CM | POA: Diagnosis not present

## 2019-08-06 DIAGNOSIS — E46 Unspecified protein-calorie malnutrition: Secondary | ICD-10-CM | POA: Diagnosis not present

## 2019-08-07 DIAGNOSIS — N186 End stage renal disease: Secondary | ICD-10-CM | POA: Diagnosis not present

## 2019-08-07 DIAGNOSIS — M109 Gout, unspecified: Secondary | ICD-10-CM | POA: Diagnosis not present

## 2019-08-07 DIAGNOSIS — I132 Hypertensive heart and chronic kidney disease with heart failure and with stage 5 chronic kidney disease, or end stage renal disease: Secondary | ICD-10-CM | POA: Diagnosis not present

## 2019-08-07 DIAGNOSIS — I5032 Chronic diastolic (congestive) heart failure: Secondary | ICD-10-CM | POA: Diagnosis not present

## 2019-08-07 DIAGNOSIS — L89154 Pressure ulcer of sacral region, stage 4: Secondary | ICD-10-CM | POA: Diagnosis not present

## 2019-08-07 DIAGNOSIS — E1122 Type 2 diabetes mellitus with diabetic chronic kidney disease: Secondary | ICD-10-CM | POA: Diagnosis not present

## 2019-08-09 DIAGNOSIS — E1121 Type 2 diabetes mellitus with diabetic nephropathy: Secondary | ICD-10-CM | POA: Diagnosis not present

## 2019-08-09 DIAGNOSIS — Z992 Dependence on renal dialysis: Secondary | ICD-10-CM | POA: Diagnosis not present

## 2019-08-09 DIAGNOSIS — N186 End stage renal disease: Secondary | ICD-10-CM | POA: Diagnosis not present

## 2019-08-09 DIAGNOSIS — N2581 Secondary hyperparathyroidism of renal origin: Secondary | ICD-10-CM | POA: Diagnosis not present

## 2019-08-11 DIAGNOSIS — I132 Hypertensive heart and chronic kidney disease with heart failure and with stage 5 chronic kidney disease, or end stage renal disease: Secondary | ICD-10-CM | POA: Diagnosis not present

## 2019-08-11 DIAGNOSIS — N186 End stage renal disease: Secondary | ICD-10-CM | POA: Diagnosis not present

## 2019-08-11 DIAGNOSIS — E1121 Type 2 diabetes mellitus with diabetic nephropathy: Secondary | ICD-10-CM | POA: Diagnosis not present

## 2019-08-11 DIAGNOSIS — Z992 Dependence on renal dialysis: Secondary | ICD-10-CM | POA: Diagnosis not present

## 2019-08-11 DIAGNOSIS — M109 Gout, unspecified: Secondary | ICD-10-CM | POA: Diagnosis not present

## 2019-08-11 DIAGNOSIS — I5032 Chronic diastolic (congestive) heart failure: Secondary | ICD-10-CM | POA: Diagnosis not present

## 2019-08-11 DIAGNOSIS — L89154 Pressure ulcer of sacral region, stage 4: Secondary | ICD-10-CM | POA: Diagnosis not present

## 2019-08-11 DIAGNOSIS — N2581 Secondary hyperparathyroidism of renal origin: Secondary | ICD-10-CM | POA: Diagnosis not present

## 2019-08-11 DIAGNOSIS — E1122 Type 2 diabetes mellitus with diabetic chronic kidney disease: Secondary | ICD-10-CM | POA: Diagnosis not present

## 2019-08-12 ENCOUNTER — Ambulatory Visit (INDEPENDENT_AMBULATORY_CARE_PROVIDER_SITE_OTHER): Payer: Medicare Other | Admitting: Neurology

## 2019-08-12 ENCOUNTER — Telehealth: Payer: Self-pay | Admitting: *Deleted

## 2019-08-12 ENCOUNTER — Other Ambulatory Visit: Payer: Self-pay

## 2019-08-12 ENCOUNTER — Encounter: Payer: Self-pay | Admitting: Neurology

## 2019-08-12 ENCOUNTER — Telehealth: Payer: Self-pay | Admitting: Neurology

## 2019-08-12 VITALS — BP 135/87 | HR 85 | Temp 96.6°F

## 2019-08-12 DIAGNOSIS — N186 End stage renal disease: Secondary | ICD-10-CM | POA: Diagnosis not present

## 2019-08-12 DIAGNOSIS — R269 Unspecified abnormalities of gait and mobility: Secondary | ICD-10-CM

## 2019-08-12 DIAGNOSIS — I132 Hypertensive heart and chronic kidney disease with heart failure and with stage 5 chronic kidney disease, or end stage renal disease: Secondary | ICD-10-CM | POA: Diagnosis not present

## 2019-08-12 DIAGNOSIS — G253 Myoclonus: Secondary | ICD-10-CM

## 2019-08-12 DIAGNOSIS — E1122 Type 2 diabetes mellitus with diabetic chronic kidney disease: Secondary | ICD-10-CM | POA: Diagnosis not present

## 2019-08-12 DIAGNOSIS — L89154 Pressure ulcer of sacral region, stage 4: Secondary | ICD-10-CM | POA: Diagnosis not present

## 2019-08-12 DIAGNOSIS — G931 Anoxic brain damage, not elsewhere classified: Secondary | ICD-10-CM | POA: Diagnosis not present

## 2019-08-12 DIAGNOSIS — I5032 Chronic diastolic (congestive) heart failure: Secondary | ICD-10-CM | POA: Diagnosis not present

## 2019-08-12 DIAGNOSIS — M109 Gout, unspecified: Secondary | ICD-10-CM | POA: Diagnosis not present

## 2019-08-12 MED ORDER — LEVETIRACETAM 250 MG PO TABS: 250.0000 mg | ORAL_TABLET | Freq: Two times a day (BID) | ORAL | 11 refills | Status: DC

## 2019-08-12 NOTE — Telephone Encounter (Signed)
Tried to contact pt to go over screening questions, VM full. Troy Wells, CMA\

## 2019-08-12 NOTE — Progress Notes (Signed)
    SUBJECTIVE:   CHIEF COMPLAINT / HPI:   Hospital follow up Hospitalized from 1/10-1/14 for bacteremia from coag negative Staphylococcus.  Treated with a 14-day course of vancomycin.  He was discharged to [facility] where he completed his vancomycin.  He currently feels well denies any symptoms of shortness of breath or fever.  Myoclonus During his hospitalization, he seemed to develop worsening myoclonus which was assessed by neurology.  At a follow-up appointment with neurology on 2/25, he was recommended to start on Keppra and to have a repeat MRI brain done.  PERTINENT  PMH / PSH: OSA, sleep apnea, ESRD, previous MI, recent hospitalization for COVID-19 infection followed by different hospitalization for coagulase-negative staphylococcal's.  OBJECTIVE:   BP 140/76   Pulse 75   SpO2 99%   General: Alert and cooperative and appears to be in no acute distress HEENT: Neck non-tender without lymphadenopathy, masses or thyromegaly Cardio: Normal S1 and S2, no S3 or S4. Rhythm is regular. 3/6 systolic murmur.   Pulm: Clear to auscultation bilaterally, no crackles, wheezing, or diminished breath sounds. Normal respiratory effort Abdomen: Bowel sounds normal. Abdomen soft and non-tender.  Extremities: No peripheral edema. Warm/ well perfused.  Strong radial pulse. Neuro: Cranial nerves grossly intact   ASSESSMENT/PLAN:   Coagulase negative Staphylococcus bacteremia Recovering well.  No symptoms at this time.  Completed antibiotic course. -Follow-up CBC, BMP  Myoclonic jerking Continue work-up with neurology.     Matilde Haymaker, MD [place]

## 2019-08-12 NOTE — Progress Notes (Signed)
PATIENT: Troy Wells DOB: [birth year]  Chief Complaint  Patient presents with  . Myoclonic Jerks    He is here with his daughter, Troy Wells. The patient reports having COVID-19 in October 2020 eventually causing him to be hospitalized and intubated. He also says he suffered a heart attack during this time. Since being discharged, he has continued to have myoclonic jerking. He went to inpatient rehab for one month. He been released and is still participating in PT at home. He is in dialysis three days weekly due to his chronic kidney disease.  Troy Kitchen PCP    [name], MD     HISTORICAL  Troy Wells is a 54 year old male, seen in request by his primary care physician Dr. Gwendlyn Deutscher, Tawanna Solo for evaluation of myoclonic jerking, is accompanied by his daughter note, at today's visit on August 12, 2019.  I have reviewed and summarized the referring note from the referring physician.  He had past medical history of gout, hyperlipidemia, chronic kidney disease, has been on dialysis since 2016, coronary artery disease, diabetes,   I reviewed extensive hospital admissions, he had a prolonged hospital admission October 28 through 06/16/2019 for Covid pneumonia, ARDS, he drove himself to dialysis on April 14, 2019,, presented with shortness of breath, troponin was 2115, he suffered a heart attack, was tested positive for Covid, later also hypoxemia, bradycardia on November 10, went into PEA arrest, was resuscitated after 30 minutes of CPR, prolonged ICU stay, intubated and sedated until November 2023, noted frequent body twitching, had a neurology consult, continuous EEG, did not show epileptic discharge, but did show profound diffuse encephalopathy, he was started on Keppra,  His acute respiratory distress was believed to be multifactorial, including COVID-19 infection, with superimposed bacterial infection, hypervolemia from ESRD, he was treated with vancomycin, Zosyn, later  ceftriaxone,  He continued to have confusion following extubation, Keppra was discontinued on November 25,  Repeat Covid testing was negative on December 7, June 14, 2019  His hospital course was also complicated by left upper extremity, and sacral area ulcer, require wound care,  There was described bilateral upper extremity tremor since his cardiac arrest, hospital neurologist was consulted, 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  I Personally reviewed MRI of the brain without contrast on May 02, 2019, heterogeneous signal changes at bilateral medial thalamus, suspicious for an anoxic brain injury, chronic right cerebellar infarction  Laboratory evaluation July 01, 2019:, Hemoglobin of 10, creatinine of 11.4, mildly decreased albumin 3.0, otherwise normal liver functional test, was 19, it was elevated 50 3 months ago, ferritin 2719, lactate dehydrogenase 195, D-dimer 1.34, negative ANA,  Lactic acid was 8.7 April 27, 2019,  Echocardiogram January 2021, ejection fraction was 30%, moderate to severely decreased left ventricular function, mildly increased left ventricular wall thickness, severely dilated left ventricular internal cavity size, left ventricle regional wall motion abnormality, inferolateral akinesis, severe inferior hypokinesis, severe anterolateral hypokinesia  Prior to his hospital admission, he works as a Administrator, drove himself to dialysis the day of his hospital admission, then he was discharged to rehab for extended period of time, just came back home around July 27, 2018, continue to show slow improvement, but continued bothered by a large amplitude upper extremity, and the lower extremity tremor, difficulty walking, rely on his walker, difficulty feeding, sometimes food jumping out of his utensils,  REVIEW OF SYSTEMS: Full 14 system review of systems  performed  and notable only for as above All other review of systems were negative.  ALLERGIES: No Known Allergies  HOME MEDICATIONS: Current Outpatient Medications  Medication Sig Dispense Refill  . acetaminophen (TYLENOL) 325 MG tablet Take 650 mg by mouth every 6 (six) hours as needed for mild pain.    Troy Kitchen albuterol (VENTOLIN HFA) 108 (90 Base) MCG/ACT inhaler Inhale 1-2 puffs into the lungs every 6 (six) hours as needed for wheezing or shortness of breath. 6.7 g 0  . allopurinol (ZYLOPRIM) 100 MG tablet Take 1 tablet (100 mg total) by mouth 2 (two) times daily. 60 tablet 0  . Amino Acids-Protein Hydrolys (FEEDING SUPPLEMENT, PRO-STAT SUGAR FREE 64,) LIQD Take 30 mLs by mouth 2 (two) times daily. 1688 mL 0  . aspirin 81 MG EC tablet Take 81 mg by mouth daily.    Troy Kitchen atorvastatin (LIPITOR) 80 MG tablet Take 1 tablet (80 mg total) by mouth daily. 30 tablet 0  . b complex-vitamin c-folic acid (NEPHRO-VITE) 0.8 MG TABS tablet Take 1 tablet by mouth daily. 30 tablet 0  . bisacodyl (DULCOLAX) 10 MG suppository Place 10 mg rectally daily as needed for moderate constipation (If not relieved by MOM).    . carvedilol (COREG) 6.25 MG tablet Take 1 tablet (6.25 mg total) by mouth 2 (two) times daily with a meal. 60 tablet 0  . cetirizine (ZYRTEC) 5 MG tablet Take 1 tablet (5 mg total) by mouth daily. 30 tablet 0  . clonazePAM (KLONOPIN) 0.5 MG tablet Take 1 tablet (0.5 mg total) by mouth 2 (two) times daily. 60 tablet 0  . lidocaine (LIDODERM) 5 % Place 1 patch onto the skin daily. Remove & Discard patch within 12 hours or as directed by MD 30 patch 0  . mirtazapine (REMERON) 15 MG tablet Take 1 tablet (15 mg total) by mouth at bedtime. 30 tablet 0  . polyethylene glycol (MIRALAX / GLYCOLAX) 17 g packet Take 17 g by mouth 2 (two) times daily as needed (For Constipation).    Troy Kitchen senna (SENOKOT) 8.6 MG TABS tablet Take 1 tablet (8.6 mg total) by mouth daily. 120 tablet 0  . sevelamer carbonate (RENVELA) 800 MG tablet  Take 1 tablet (800 mg total) by mouth 3 (three) times daily with meals. 90 tablet 0  . Sodium Phosphates (RA SALINE ENEMA RE) Place 1 each rectally daily as needed (If no BM after Bisacodyl).     No current facility-administered medications for this visit.    PAST MEDICAL HISTORY: Past Medical History:  Diagnosis Date  . CARDIAC ARREST 11/01/2009   Qualifier: Diagnosis of  By: Selena Batten CMA, [place]    . Colon polyps 01/02/2016   Colonoscopy July 2017 - One 3 mm polyp in the transverse colon, removed with a cold biopsy forceps. Resected and retrieved. - One 3 mm polyp in the rectum, removed with a cold biopsy forceps. Resected and retrieved. - Diverticulosis in the entire examined colon. - Non-bleeding internal hemorrhoids. - The examination was otherwise normal. - Significant looping which prolonged cecal    . Coronary artery disease 08/15/2009   with AMI  . Coronary atherosclerosis 11/01/2009   Qualifier: Diagnosis of  By: Selena Batten CMA, Jewel    . Dialysis patient ([facility]) 09/27/2014   mon, wed, and fri  . Diverticulosis of colon without hemorrhage 01/02/2016   Colonoscopy July 2017 - One 3 mm polyp in the transverse colon, removed with a cold biopsy forceps. Resected and retrieved. - One 3 mm polyp in  the rectum, removed with a cold biopsy forceps. Resected and retrieved. - Diverticulosis in the entire examined colon. - Non-bleeding internal hemorrhoids. - The examination was otherwise normal. - Significant looping which prolonged cecal    . DM (diabetes mellitus), type 2 with renal complications ([facility]) 61/95/0932  . Essential hypertension 11/01/2009   Qualifier: Diagnosis of  By: Selena Batten CMA, Jewel    . Gout 10/21/2018  . History of acute respiratory distress syndrome (ARDS) due to COVID-19 virus   . History of cardiac arrest 04/27/2019  . History of non-ST elevation myocardial infarction (NSTEMI) 04/18/2019  . history of respiratory arrest   . Hyperlipidemia   . Hypertension   .  Mitral valve regurgitation 09/19/2015   Echo 09/2015   . 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 OSA treated with BiPAP 06/17/2010  . Sleep apnea    cpap nightly    PAST SURGICAL HISTORY: Past Surgical History:  Procedure Laterality Date  . AV FISTULA PLACEMENT Left 09/27/2014  . INSERTION OF ARTERIOVENOUS (AV) ARTEGRAFT ARM Left 04/02/2018   Procedure: INSERTION OF ARTERIOVENOUS (AV) ARTEGRAFT ARM LEFT UPPER ARM;  Surgeon: Waynetta Sandy, MD;  Location: [facility];  Service: Vascular;  Laterality: Left;  . INSERTION OF DIALYSIS CATHETER N/A 04/02/2018   Procedure: INSERTION OF DIALYSIS CATHETER, right internal jugular;  Surgeon: Waynetta Sandy, MD;  Location: [facility];  Service: Vascular;  Laterality: N/A;  . NECK SURGERY     C6 & C7 replaced 30 yrs ago per pt  . REVISON OF ARTERIOVENOUS FISTULA Left 04/02/2018   Procedure: REVISION PLICATION OF ARTERIOVENOUS FISTULA ARM;  Surgeon: Waynetta Sandy, MD;  Location: [facility];  Service: Vascular;  Laterality: Left;  . WISDOM TOOTH EXTRACTION      FAMILY HISTORY: Family History  Problem Relation Age of Onset  . Hypertension Mother   . Heart disease Mother 65       cause of death  . Hypertension Father   . Aneurysm Father 8       brain aneurysm  . Heart disease Father 24       cause of death  . Hypertension Brother   . Alcohol abuse Brother   . Diabetes Brother   . Pancreatitis Brother   . Hypertension Brother   . Colon cancer Neg Hx     SOCIAL HISTORY: Social History   Socioeconomic History  . Marital status: Single    Spouse name: Not on file  . Number of children: 1  . Years of education: college  . Highest education level: Not on file  Occupational History  . Not on file  Tobacco Use  . Smoking status: Former Smoker    Packs/day: 1.00    Years: 10.00    Pack years: 10.00    Types: Cigars    Quit date: 06/18/2011    Years since quitting: 8.1  . Smokeless tobacco: Never Used  Substance and Sexual  Activity  . Alcohol use: Yes    Comment: beer or liquor occasionally  . Drug use: No  . Sexual activity: Yes  Other Topics Concern  . Not on file  Social History Narrative   Lives at home with his aunt. His daughter also helps him.   Right-handed.   No daily caffeine use.   Social Determinants of Health   Financial Resource Strain:   . Difficulty of Paying Living Expenses: Not on file  Food Insecurity:   . Worried About Charity fundraiser in the Last Year: Not  on file  . Ran Out of Food in the Last Year: Not on file  Transportation Needs:   . Lack of Transportation (Medical): Not on file  . Lack of Transportation (Non-Medical): Not on file  Physical Activity:   . Days of Exercise per Week: Not on file  . Minutes of Exercise per Session: Not on file  Stress:   . Feeling of Stress : Not on file  Social Connections:   . Frequency of Communication with Friends and Family: Not on file  . Frequency of Social Gatherings with Friends and Family: Not on file  . Attends Religious Services: Not on file  . Active Member of Clubs or Organizations: Not on file  . Attends Archivist Meetings: Not on file  . Marital Status: Not on file  Intimate Partner Violence:   . Fear of Current or Ex-Partner: Not on file  . Emotionally Abused: Not on file  . Physically Abused: Not on file  . Sexually Abused: Not on file     PHYSICAL EXAM   Vitals:   08/12/19 0829  BP: 135/87  Pulse: 85  Temp: (!) 96.6 F (35.9 C)    Not recorded      There is no height or weight on file to calculate BMI.  PHYSICAL EXAMNIATION:  Gen: NAD, conversant, well nourised, well groomed                     Cardiovascular: Regular rate rhythm, no peripheral edema, warm, nontender. Eyes: Conjunctivae clear without exudates or hemorrhage Neck: Supple, no carotid bruits. Pulmonary: Clear to auscultation bilaterally   NEUROLOGICAL EXAM:  MENTAL STATUS: Speech:    Speech is normal; fluent and  spontaneous with normal comprehension.  Cognition:     Orientation to time, place and person     Normal recent and remote memory     Normal Attention span and concentration     Normal Language, naming, repeating,spontaneous speech     Fund of knowledge   CRANIAL NERVES: CN II: Visual fields are full to confrontation. Pupils are round equal and briskly reactive to light. CN III, IV, VI: extraocular movement are normal. No ptosis. CN V: Facial sensation is intact to light touch CN VII: Face is symmetric with normal eye closure  CN VIII: Hearing is normal to causal conversation. CN IX, X: Phonation is normal. CN XI: Head turning and shoulder shrug are intact  MOTOR: Normal muscle tone, and stress, large amplitude upper and lower extremity  action tremor, noticeable when holding posturing,   REFLEXES: Reflexes are 2+ and symmetric at the biceps, triceps, knees, and ankles. Plantar responses are extensor bilaterally  SENSORY: Intact to light touch, pinprick and vibratory sensation are intact in fingers and toes.  COORDINATION: There is no trunk or limb dysmetria noted.  GAIT/STANCE: Rely on walker to get up from seated position, wide-based, unsteady, large amplitude bilateral lower extremity tremor   DIAGNOSTIC DATA (LABS, IMAGING, TESTING) - I reviewed patient records, labs, notes, testing and imaging myself where available.   ASSESSMENT AND PLAN  Troy Wells is a 54 y.o. male   Status post hypoxic injury due to heart attack, COVID-19 pneumonia, ARDS, required prolonged CPR and intubation in November 2020  Large amplitude upper and lower extremity tremor most consistent with myoclonic jerking movement,  Differentiation diagnoses are metabolic toxic, versus due to anoxic brain injury  Repeat MRI of brain  EEG  Add on low-dose Keppra 250 twice a day  Troy Wells, M.D. Ph.D.  [facility] [address], [place], [place] [address] Ph: [phone] Fax: [phone]  CC: [name], MD

## 2019-08-12 NOTE — Telephone Encounter (Signed)
medicare order sent to GI. No auth they will reach out to the patient to schedule.  

## 2019-08-13 ENCOUNTER — Encounter: Payer: Self-pay | Admitting: Family Medicine

## 2019-08-13 ENCOUNTER — Ambulatory Visit (INDEPENDENT_AMBULATORY_CARE_PROVIDER_SITE_OTHER): Payer: Medicare Other | Admitting: Family Medicine

## 2019-08-13 ENCOUNTER — Other Ambulatory Visit: Payer: Self-pay

## 2019-08-13 VITALS — BP 140/76 | HR 75

## 2019-08-13 DIAGNOSIS — I871 Compression of vein: Secondary | ICD-10-CM | POA: Diagnosis not present

## 2019-08-13 DIAGNOSIS — G4733 Obstructive sleep apnea (adult) (pediatric): Secondary | ICD-10-CM | POA: Diagnosis not present

## 2019-08-13 DIAGNOSIS — G253 Myoclonus: Secondary | ICD-10-CM

## 2019-08-13 DIAGNOSIS — N186 End stage renal disease: Secondary | ICD-10-CM | POA: Diagnosis not present

## 2019-08-13 DIAGNOSIS — E118 Type 2 diabetes mellitus with unspecified complications: Secondary | ICD-10-CM

## 2019-08-13 DIAGNOSIS — B957 Other staphylococcus as the cause of diseases classified elsewhere: Secondary | ICD-10-CM

## 2019-08-13 DIAGNOSIS — T82868A Thrombosis of vascular prosthetic devices, implants and grafts, initial encounter: Secondary | ICD-10-CM | POA: Diagnosis not present

## 2019-08-13 DIAGNOSIS — I2510 Atherosclerotic heart disease of native coronary artery without angina pectoris: Secondary | ICD-10-CM | POA: Diagnosis not present

## 2019-08-13 DIAGNOSIS — R7881 Bacteremia: Secondary | ICD-10-CM | POA: Diagnosis not present

## 2019-08-13 DIAGNOSIS — Z992 Dependence on renal dialysis: Secondary | ICD-10-CM | POA: Diagnosis not present

## 2019-08-13 NOTE — Patient Instructions (Addendum)
We will draw some blood today and occasional blood counts in your kidneys and electrolytes.  I will let you know if there are any abnormalities.  I think that it is very important for you to continue taking at least one baby aspirin a day.  Attached to this sheet, you will find an up-to-date list of his medications.  Please feel free to refer to this list if you have questions about his current regimen.  Return to clinic in 6 months.

## 2019-08-13 NOTE — Assessment & Plan Note (Signed)
Continue work-up with neurology.

## 2019-08-13 NOTE — Assessment & Plan Note (Signed)
Recovering well.  No symptoms at this time.  Completed antibiotic course. -Follow-up CBC, BMP

## 2019-08-14 ENCOUNTER — Other Ambulatory Visit: Payer: Self-pay

## 2019-08-14 ENCOUNTER — Telehealth: Payer: Self-pay | Admitting: Family Medicine

## 2019-08-14 ENCOUNTER — Emergency Department (HOSPITAL_COMMUNITY)
Admission: EM | Admit: 2019-08-14 | Discharge: 2019-08-14 | Disposition: A | Discharge status: Home / Self Care | Payer: Medicare Other | Attending: Emergency Medicine | Admitting: Emergency Medicine

## 2019-08-14 ENCOUNTER — Encounter (HOSPITAL_COMMUNITY): Payer: Self-pay

## 2019-08-14 DIAGNOSIS — E1140 Type 2 diabetes mellitus with diabetic neuropathy, unspecified: Secondary | ICD-10-CM | POA: Diagnosis not present

## 2019-08-14 DIAGNOSIS — Z79899 Other long term (current) drug therapy: Secondary | ICD-10-CM | POA: Diagnosis not present

## 2019-08-14 DIAGNOSIS — E1122 Type 2 diabetes mellitus with diabetic chronic kidney disease: Secondary | ICD-10-CM | POA: Diagnosis not present

## 2019-08-14 DIAGNOSIS — Z992 Dependence on renal dialysis: Secondary | ICD-10-CM | POA: Insufficient documentation

## 2019-08-14 DIAGNOSIS — I120 Hypertensive chronic kidney disease with stage 5 chronic kidney disease or end stage renal disease: Secondary | ICD-10-CM | POA: Diagnosis not present

## 2019-08-14 DIAGNOSIS — E875 Hyperkalemia: Secondary | ICD-10-CM | POA: Diagnosis not present

## 2019-08-14 DIAGNOSIS — E1121 Type 2 diabetes mellitus with diabetic nephropathy: Secondary | ICD-10-CM | POA: Diagnosis not present

## 2019-08-14 DIAGNOSIS — R7989 Other specified abnormal findings of blood chemistry: Secondary | ICD-10-CM | POA: Diagnosis present

## 2019-08-14 DIAGNOSIS — N186 End stage renal disease: Secondary | ICD-10-CM | POA: Insufficient documentation

## 2019-08-14 DIAGNOSIS — I10 Essential (primary) hypertension: Secondary | ICD-10-CM | POA: Diagnosis not present

## 2019-08-14 DIAGNOSIS — N2581 Secondary hyperparathyroidism of renal origin: Secondary | ICD-10-CM | POA: Diagnosis not present

## 2019-08-14 LAB — CBC WITH DIFFERENTIAL/PLATELET
Abs Immature Granulocytes: 0.02 10*3/uL (ref 0.00–0.07)
Basophils Absolute: 0 10*3/uL (ref 0.0–0.1)
Basophils Relative: 1 %
Eosinophils Absolute: 0.1 10*3/uL (ref 0.0–0.5)
Eosinophils Relative: 2 %
HCT: 30.8 % — ABNORMAL LOW (ref 39.0–52.0)
Hemoglobin: 10 g/dL — ABNORMAL LOW (ref 13.0–17.0)
Immature Granulocytes: 0 %
Lymphocytes Relative: 31 %
Lymphs Abs: 1.9 10*3/uL (ref 0.7–4.0)
MCH: 27.5 pg (ref 26.0–34.0)
MCHC: 32.5 g/dL (ref 30.0–36.0)
MCV: 84.8 fL (ref 80.0–100.0)
Monocytes Absolute: 0.5 10*3/uL (ref 0.1–1.0)
Monocytes Relative: 8 %
Neutro Abs: 3.6 10*3/uL (ref 1.7–7.7)
Neutrophils Relative %: 58 %
Platelets: 216 10*3/uL (ref 150–400)
RBC: 3.63 MIL/uL — ABNORMAL LOW (ref 4.22–5.81)
RDW: 16.1 % — ABNORMAL HIGH (ref 11.5–15.5)
WBC: 6.2 10*3/uL (ref 4.0–10.5)
nRBC: 0 % (ref 0.0–0.2)

## 2019-08-14 LAB — BASIC METABOLIC PANEL
Anion gap: 20 — ABNORMAL HIGH (ref 5–15)
BUN/Creatinine Ratio: 7 — ABNORMAL LOW (ref 9–20)
BUN: 78 mg/dL (ref 6–24)
BUN: 96 mg/dL — ABNORMAL HIGH (ref 6–20)
CO2: 22 mmol/L (ref 20–29)
CO2: 21 mmol/L — ABNORMAL LOW (ref 22–32)
Calcium: 10.2 mg/dL (ref 8.7–10.2)
Calcium: 9.7 mg/dL (ref 8.9–10.3)
Chloride: 94 mmol/L — ABNORMAL LOW (ref 96–106)
Chloride: 95 mmol/L — ABNORMAL LOW (ref 98–111)
Creatinine, Ser: 11.86 mg/dL — ABNORMAL HIGH (ref 0.76–1.27)
Creatinine, Ser: 13.78 mg/dL — ABNORMAL HIGH (ref 0.61–1.24)
GFR calc Af Amer: 5 mL/min/{1.73_m2} — ABNORMAL LOW (ref >59)
GFR calc Af Amer: 4 mL/min — ABNORMAL LOW (ref >60)
GFR calc non Af Amer: 4 mL/min/{1.73_m2} — ABNORMAL LOW (ref >59)
GFR calc non Af Amer: 4 mL/min — ABNORMAL LOW (ref >60)
Glucose, Bld: 189 mg/dL — ABNORMAL HIGH (ref 70–99)
Glucose: 103 mg/dL — ABNORMAL HIGH (ref 65–99)
Potassium: 6.8 mmol/L (ref 3.5–5.2)
Potassium: 6.1 mmol/L — ABNORMAL HIGH (ref 3.5–5.1)
Sodium: 138 mmol/L (ref 134–144)
Sodium: 136 mmol/L (ref 135–145)

## 2019-08-14 LAB — CBC
Hematocrit: 33.3 % — ABNORMAL LOW (ref 37.5–51.0)
Hemoglobin: 10.7 g/dL — ABNORMAL LOW (ref 13.0–17.7)
MCH: 27.9 pg (ref 26.6–33.0)
MCHC: 32.1 g/dL (ref 31.5–35.7)
MCV: 87 fL (ref 79–97)
Platelets: 260 10*3/uL (ref 150–450)
RBC: 3.84 x10E6/uL — ABNORMAL LOW (ref 4.14–5.80)
RDW: 16.1 % — ABNORMAL HIGH (ref 11.6–15.4)
WBC: 7.2 10*3/uL (ref 3.4–10.8)

## 2019-08-14 LAB — I-STAT CHEM 8, ED
BUN: 91 mg/dL — ABNORMAL HIGH (ref 6–20)
Calcium, Ion: 1.17 mmol/L (ref 1.15–1.40)
Chloride: 99 mmol/L (ref 98–111)
Creatinine, Ser: 14.2 mg/dL — ABNORMAL HIGH (ref 0.61–1.24)
Glucose, Bld: 178 mg/dL — ABNORMAL HIGH (ref 70–99)
HCT: 31 % — ABNORMAL LOW (ref 39.0–52.0)
Hemoglobin: 10.5 g/dL — ABNORMAL LOW (ref 13.0–17.0)
Potassium: 6 mmol/L — ABNORMAL HIGH (ref 3.5–5.1)
Sodium: 134 mmol/L — ABNORMAL LOW (ref 135–145)
TCO2: 26 mmol/L (ref 22–32)

## 2019-08-14 MED ORDER — SODIUM ZIRCONIUM CYCLOSILICATE 10 G PO PACK
10.0000 g | PACK | Freq: Once | ORAL | Status: AC
  Administered 2019-08-14: 10 g via ORAL
  Filled 2019-08-14: qty 1

## 2019-08-14 NOTE — Telephone Encounter (Signed)
**  After Hours/ Emergency Line Call**  Received a page regarding a critical lab value obtained during [facility] follow-up yesterday.  Was admitted for coag negative staph bacteremia and was treated with 14-day course of vancomycin.  Critical labs obtained potassium 6.8, BUN 70. Called patient several times to assess for symptomatology however no answer and VM not set up. Called emergency contact, daughter Marijo Conception, however had to leave VM asking for call back immediately.   At time of appointment, was feeling well.  Per chart review, is ESRD on HD MWF so may have undergone HD after labs drawn though unable to tell from chart.  Will continue to try to reach patient. Will forward to PCP and provider who saw patient for appointment.  Rory Percy, DO PGY-3, [facility] 08/14/2019 2:38 AM

## 2019-08-14 NOTE — ED Triage Notes (Signed)
Pt is a MWF dialysis patient, reports he had a procedure on dialysis catheter yesterday and was scheduled to go to dialysis today instead. He states his doctor called him yesterday evening and advised him that his potassium was high so to come to the ER in the morning and not go to dialysis. Pt has no complaints. Denies cp or sob, a/ox4, resp e/u, nad.

## 2019-08-14 NOTE — Discharge Instructions (Signed)
Please go straight to the dialysis center

## 2019-08-14 NOTE — Telephone Encounter (Signed)
**  After Hours/ Emergency Line Call**  Received a return call from Brixton Franko' daughter regarding his critical lab abnormalities. He normally gets HD MWF however did not get HD yesterday due to having to get his fistula "cleaned out." He is scheduled for HD today at 11am. Patient denies SOB, chest pain, palpitations. Advised patient and daughter given critically elevated potassium at 6.8, he should present to the ED for further evaluation and management rather than waiting until HD. Daughter, aunt, and patient all verbalized understanding.  Will forward to PCP.  Rory Percy, DO PGY-3, [facility] 08/14/2019 7:51 AM

## 2019-08-14 NOTE — ED Provider Notes (Signed)
[facility] EMERGENCY DEPARTMENT Provider Note   CSN: [id number] Arrival date & time: 08/14/19  7829     History Chief Complaint  Patient presents with  . Abnormal Lab    Troy Wells is a 54 y.o. male with ESRD on dialysis M/W/F who presents with hyperkalemia. He goes to [facility] and saw his PCP yesterday for a hospital follow up. He was in the hospital from 1/10-1/14 due to bacteremia. He finished a course of Vancomycin and was discharged. He did not go to dialysis yesterday because he had a procedure done on his fistula. He was rescheduled for dialysis this morning at 11am but was called by the on call provider to come to the ED instead. Pt denies any symptoms. Specifically no lightheadedness, near syncope, CP, SOB. He goes to [employer]. HPI     Past Medical History:  Diagnosis Date  . CARDIAC ARREST 11/01/2009   Qualifier: Diagnosis of  By: Selena Batten CMA, [place]    . Colon polyps 01/02/2016   Colonoscopy July 2017 - One 3 mm polyp in the transverse colon, removed with a cold biopsy forceps. Resected and retrieved. - One 3 mm polyp in the rectum, removed with a cold biopsy forceps. Resected and retrieved. - Diverticulosis in the entire examined colon. - Non-bleeding internal hemorrhoids. - The examination was otherwise normal. - Significant looping which prolonged cecal    . Coronary artery disease 08/15/2009   with AMI  . Coronary atherosclerosis 11/01/2009   Qualifier: Diagnosis of  By: Selena Batten CMA, Jewel    . Dialysis patient ([place]) 09/27/2014   mon, wed, and fri  . Diverticulosis of colon without hemorrhage 01/02/2016   Colonoscopy July 2017 - One 3 mm polyp in the transverse colon, removed with a cold biopsy forceps. Resected and retrieved. - One 3 mm polyp in the rectum, removed with a cold biopsy forceps. Resected and retrieved. - Diverticulosis in the entire examined colon. - Non-bleeding internal hemorrhoids. - The  examination was otherwise normal. - Significant looping which prolonged cecal    . DM (diabetes mellitus), type 2 with renal complications ([place]) 56/21/3086  . Essential hypertension 11/01/2009   Qualifier: Diagnosis of  By: Selena Batten CMA, Jewel    . Gout 10/21/2018  . History of acute respiratory distress syndrome (ARDS) due to COVID-19 virus   . History of cardiac arrest 04/27/2019  . History of non-ST elevation myocardial infarction (NSTEMI) 04/18/2019  . history of respiratory arrest   . Hyperlipidemia   . Hypertension   . Mitral valve regurgitation 09/19/2015   Echo 09/2015   . 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 OSA treated with BiPAP 06/17/2010  . Sleep apnea    cpap nightly    Patient Active Problem List   Diagnosis Date Noted  . 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 Hypoxic brain injury ([place]) 08/12/2019  . Gait abnormality 08/12/2019  . Coagulase negative Staphylococcus bacteremia 06/30/2019  . History of gout 06/17/2019  . 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 Pain aggravated by sitting 06/09/2019  . Bowel incontinence 06/09/2019  . Protein-calorie malnutrition ([place]) 06/01/2019  . Weakness acquired in ICU   . Anoxic brain injury ([place])   . Physical deconditioning   . History of cardiac arrest 04/27/2019  . History of non-ST elevation myocardial infarction (NSTEMI) 04/18/2019  . Pulmonary hypertension ([place]) 04/14/2019  . Morbid obesity ([place]) 04/14/2019  . Diabetic neuropathy ([place]) 04/02/2016  . Hypertension associated with diabetes ([place]) 10/03/2015  . HLD (hyperlipidemia) 09/06/2015  .  Stage 5 chronic kidney disease on chronic dialysis ([place]) 08/31/2015  . DM (diabetes mellitus), type 2 with renal complications ([place]) 76/72/0947  . OSA treated with BiPAP 12/12/2013  . Coronary atherosclerosis 11/01/2009    Past Surgical History:  Procedure Laterality Date  . AV FISTULA PLACEMENT Left 09/27/2014  . INSERTION OF ARTERIOVENOUS (AV) ARTEGRAFT ARM Left 04/02/2018   Procedure: INSERTION OF  ARTERIOVENOUS (AV) ARTEGRAFT ARM LEFT UPPER ARM;  Surgeon: Waynetta Sandy, MD;  Location: [place];  Service: Vascular;  Laterality: Left;  . INSERTION OF DIALYSIS CATHETER N/A 04/02/2018   Procedure: INSERTION OF DIALYSIS CATHETER, right internal jugular;  Surgeon: Waynetta Sandy, MD;  Location: [place];  Service: Vascular;  Laterality: N/A;  . NECK SURGERY     C6 & C7 replaced 30 yrs ago per pt  . REVISON OF ARTERIOVENOUS FISTULA Left 04/02/2018   Procedure: REVISION PLICATION OF ARTERIOVENOUS FISTULA ARM;  Surgeon: Waynetta Sandy, MD;  Location: [place];  Service: Vascular;  Laterality: Left;  . WISDOM TOOTH EXTRACTION         Family History  Problem Relation Age of Onset  . Hypertension Mother   . Heart disease Mother 69       cause of death  . Hypertension Father   . Aneurysm Father 3       brain aneurysm  . Heart disease Father 33       cause of death  . Hypertension Brother   . Alcohol abuse Brother   . Diabetes Brother   . Pancreatitis Brother   . Hypertension Brother   . Colon cancer Neg Hx     Social History   Tobacco Use  . Smoking status: Former Smoker    Packs/day: 1.00    Years: 10.00    Pack years: 10.00    Types: Cigars    Quit date: 06/18/2011    Years since quitting: 8.1  . Smokeless tobacco: Never Used  Substance Use Topics  . Alcohol use: Yes    Comment: beer or liquor occasionally  . Drug use: No    Home Medications Prior to Admission medications   Medication Sig Start Date End Date Taking? Authorizing Provider  acetaminophen (TYLENOL) 325 MG tablet Take 650 mg by mouth every 6 (six) hours as needed for mild pain.    [provider]  albuterol (VENTOLIN HFA) 108 (90 Base) MCG/ACT inhaler Inhale 1-2 puffs into the lungs every 6 (six) hours as needed for wheezing or shortness of breath. 08/03/19   Medina-Vargas, Monina C, NP  allopurinol (ZYLOPRIM) 100 MG tablet Take 1 tablet (100 mg total) by mouth 2 (two) times  daily. 08/03/19   Medina-Vargas, Monina C, NP  Amino Acids-Protein Hydrolys (FEEDING SUPPLEMENT, PRO-STAT SUGAR FREE 64,) LIQD Take 30 mLs by mouth 2 (two) times daily. 08/03/19   Medina-Vargas, Monina C, NP  aspirin 81 MG EC tablet Take 81 mg by mouth daily. 04/21/19   [provider]  atorvastatin (LIPITOR) 80 MG tablet Take 1 tablet (80 mg total) by mouth daily. 08/03/19   Medina-Vargas, Monina C, NP  b complex-vitamin c-folic acid (NEPHRO-VITE) 0.8 MG TABS tablet Take 1 tablet by mouth daily. 08/03/19   Medina-Vargas, Monina C, NP  bisacodyl (DULCOLAX) 10 MG suppository Place 10 mg rectally daily as needed for moderate constipation (If not relieved by MOM).    [provider]  carvedilol (COREG) 6.25 MG tablet Take 1 tablet (6.25 mg total) by mouth 2 (two) times daily with  a meal. 08/03/19   Medina-Vargas, Monina C, NP  cetirizine (ZYRTEC) 5 MG tablet Take 1 tablet (5 mg total) by mouth daily. 08/03/19   Medina-Vargas, Monina C, NP  clonazePAM (KLONOPIN) 0.5 MG tablet Take 1 tablet (0.5 mg total) by mouth 2 (two) times daily. 08/03/19   Medina-Vargas, Monina C, NP  levETIRAcetam (KEPPRA) 250 MG tablet Take 1 tablet (250 mg total) by mouth 2 (two) times daily. 08/12/19   Marcial Pacas, MD  lidocaine (LIDODERM) 5 % Place 1 patch onto the skin daily. Remove & Discard patch within 12 hours or as directed by MD 08/03/19   Medina-Vargas, Monina C, NP  mirtazapine (REMERON) 15 MG tablet Take 1 tablet (15 mg total) by mouth at bedtime. 08/03/19   Medina-Vargas, Monina C, NP  polyethylene glycol (MIRALAX / GLYCOLAX) 17 g packet Take 17 g by mouth 2 (two) times daily as needed (For Constipation).    [provider]  senna (SENOKOT) 8.6 MG TABS tablet Take 1 tablet (8.6 mg total) by mouth daily. 06/16/19   Kathrene Alu, MD  sevelamer carbonate (RENVELA) 800 MG tablet Take 1 tablet (800 mg total) by mouth 3 (three) times daily with meals. 08/03/19   Medina-Vargas, Monina C, NP  Sodium  Phosphates (RA SALINE ENEMA RE) Place 1 each rectally daily as needed (If no BM after Bisacodyl).    [provider]    Allergies    Patient has no known allergies.  Review of Systems   Review of Systems  Constitutional: Negative for fever.  Respiratory: Negative for shortness of breath.   Cardiovascular: Negative for chest pain.  Neurological: Negative for syncope, light-headedness and headaches.  All other systems reviewed and are negative.   Physical Exam Updated Vital Signs Ht 5' 11.5" (1.816 m)   Wt 93 kg   BMI 28.19 kg/m   Physical Exam Vitals and nursing note reviewed.  Constitutional:      General: He is not in acute distress.    Appearance: Normal appearance. He is well-developed. He is not ill-appearing.     Comments: Chronically ill appearing male in NAD  HENT:     Head: Normocephalic and atraumatic.  Eyes:     General: No scleral icterus.       Right eye: No discharge.        Left eye: No discharge.     Conjunctiva/sclera: Conjunctivae normal.     Pupils: Pupils are equal, round, and reactive to light.  Cardiovascular:     Rate and Rhythm: Normal rate and regular rhythm.  Pulmonary:     Effort: Pulmonary effort is normal. No respiratory distress.     Breath sounds: Normal breath sounds.  Abdominal:     General: There is no distension.  Musculoskeletal:     Cervical back: Normal range of motion.     Comments: Fistula in LUE with palpable thrill  Skin:    General: Skin is warm and dry.  Neurological:     Mental Status: He is alert and oriented to person, place, and time.  Psychiatric:        Behavior: Behavior normal.     ED Results / Procedures / Treatments   Labs (all labs ordered are listed, but only abnormal results are displayed) Labs Reviewed  BASIC METABOLIC PANEL - Abnormal; Notable for the following components:      Result Value   Potassium 6.1 (*)    Chloride 95 (*)    CO2 21 (*)  Glucose, Bld 189 (*)    BUN 96 (*)     Creatinine, Ser 13.78 (*)    GFR calc non Af Amer 4 (*)    GFR calc Af Amer 4 (*)    Anion gap 20 (*)    All other components within normal limits  CBC WITH DIFFERENTIAL/PLATELET - Abnormal; Notable for the following components:   RBC 3.63 (*)    Hemoglobin 10.0 (*)    HCT 30.8 (*)    RDW 16.1 (*)    All other components within normal limits  I-STAT CHEM 8, ED - Abnormal; Notable for the following components:   Sodium 134 (*)    Potassium 6.0 (*)    BUN 91 (*)    Creatinine, Ser 14.20 (*)    Glucose, Bld 178 (*)    Hemoglobin 10.5 (*)    HCT 31.0 (*)    All other components within normal limits    EKG EKG Interpretation  Date/Time:  Saturday August 14 2019 09:16:03 EST Ventricular Rate:  74 PR Interval:    QRS Duration: 109 QT Interval:  419 QTC Calculation: 465 R Axis:   -57 Text Interpretation: Sinus rhythm Probable left atrial enlargement Left anterior fascicular block Left ventricular hypertrophy Anterior Q waves, possibly due to LVH Baseline wander in lead(s) V2 No significant change since prior 1/21 Confirmed by Aletta Edouard [phone]) on 08/14/2019 9:46:51 AM   Radiology No results found.  Procedures Procedures (including critical care time)  Medications Ordered in ED Medications - No data to display  ED Course  I have reviewed the triage vital signs and the nursing notes.  Pertinent labs & imaging results that were available during my care of the patient were reviewed by me and considered in my medical decision making (see chart for details).  54 year old male presents with hyperkalemia due to missed dialysis yesterday. K was 6.8. He has no complaints and states he feels well. EKG does not show any changes and repeat BMP here shows K is 6.1. Will discuss with nephrology.  10:19 AM Discussed with Dr. Jonnie Finner - he recommends giving a dose of Lokelma and going to his dialysis center today. He made a call to the center to ensure he has a slot.   Pt's daughter  will pick him up and take him to dialysis.  MDM Rules/Calculators/A&P                       Final Clinical Impression(s) / ED Diagnoses Final diagnoses:  Hyperkalemia    Rx / DC Orders ED Discharge Orders    None       Recardo Evangelist, PA-C 08/14/19 1056    Hayden Rasmussen, MD 08/14/19 1700

## 2019-08-14 NOTE — Telephone Encounter (Signed)
Attempted calling all numbers in the chart without success (both went to VM that was full and not accepting new messages).  Chart review showed that pt was assessed in the ED and had no concerning symptoms and a slightly decreased K  today so he was being sent to his dialysis center to have dialysis today.  Matilde Haymaker, MD

## 2019-08-16 DIAGNOSIS — N2581 Secondary hyperparathyroidism of renal origin: Secondary | ICD-10-CM | POA: Diagnosis not present

## 2019-08-16 DIAGNOSIS — Z992 Dependence on renal dialysis: Secondary | ICD-10-CM | POA: Diagnosis not present

## 2019-08-16 DIAGNOSIS — E1121 Type 2 diabetes mellitus with diabetic nephropathy: Secondary | ICD-10-CM | POA: Diagnosis not present

## 2019-08-16 DIAGNOSIS — M109 Gout, unspecified: Secondary | ICD-10-CM | POA: Diagnosis not present

## 2019-08-16 DIAGNOSIS — I5032 Chronic diastolic (congestive) heart failure: Secondary | ICD-10-CM | POA: Diagnosis not present

## 2019-08-16 DIAGNOSIS — L89154 Pressure ulcer of sacral region, stage 4: Secondary | ICD-10-CM | POA: Diagnosis not present

## 2019-08-16 DIAGNOSIS — D631 Anemia in chronic kidney disease: Secondary | ICD-10-CM | POA: Diagnosis not present

## 2019-08-16 DIAGNOSIS — N186 End stage renal disease: Secondary | ICD-10-CM | POA: Diagnosis not present

## 2019-08-16 DIAGNOSIS — I132 Hypertensive heart and chronic kidney disease with heart failure and with stage 5 chronic kidney disease, or end stage renal disease: Secondary | ICD-10-CM | POA: Diagnosis not present

## 2019-08-16 DIAGNOSIS — E1122 Type 2 diabetes mellitus with diabetic chronic kidney disease: Secondary | ICD-10-CM | POA: Diagnosis not present

## 2019-08-18 DIAGNOSIS — L89154 Pressure ulcer of sacral region, stage 4: Secondary | ICD-10-CM | POA: Diagnosis not present

## 2019-08-18 DIAGNOSIS — I132 Hypertensive heart and chronic kidney disease with heart failure and with stage 5 chronic kidney disease, or end stage renal disease: Secondary | ICD-10-CM | POA: Diagnosis not present

## 2019-08-18 DIAGNOSIS — Z992 Dependence on renal dialysis: Secondary | ICD-10-CM | POA: Diagnosis not present

## 2019-08-18 DIAGNOSIS — I5032 Chronic diastolic (congestive) heart failure: Secondary | ICD-10-CM | POA: Diagnosis not present

## 2019-08-18 DIAGNOSIS — E1121 Type 2 diabetes mellitus with diabetic nephropathy: Secondary | ICD-10-CM | POA: Diagnosis not present

## 2019-08-18 DIAGNOSIS — N186 End stage renal disease: Secondary | ICD-10-CM | POA: Diagnosis not present

## 2019-08-18 DIAGNOSIS — N2581 Secondary hyperparathyroidism of renal origin: Secondary | ICD-10-CM | POA: Diagnosis not present

## 2019-08-18 DIAGNOSIS — D631 Anemia in chronic kidney disease: Secondary | ICD-10-CM | POA: Diagnosis not present

## 2019-08-18 DIAGNOSIS — E1122 Type 2 diabetes mellitus with diabetic chronic kidney disease: Secondary | ICD-10-CM | POA: Diagnosis not present

## 2019-08-18 DIAGNOSIS — M109 Gout, unspecified: Secondary | ICD-10-CM | POA: Diagnosis not present

## 2019-08-19 ENCOUNTER — Telehealth: Payer: Self-pay | Admitting: Family Medicine

## 2019-08-19 DIAGNOSIS — I132 Hypertensive heart and chronic kidney disease with heart failure and with stage 5 chronic kidney disease, or end stage renal disease: Secondary | ICD-10-CM | POA: Diagnosis not present

## 2019-08-19 DIAGNOSIS — M109 Gout, unspecified: Secondary | ICD-10-CM | POA: Diagnosis not present

## 2019-08-19 DIAGNOSIS — N186 End stage renal disease: Secondary | ICD-10-CM | POA: Diagnosis not present

## 2019-08-19 DIAGNOSIS — E1122 Type 2 diabetes mellitus with diabetic chronic kidney disease: Secondary | ICD-10-CM | POA: Diagnosis not present

## 2019-08-19 DIAGNOSIS — I5032 Chronic diastolic (congestive) heart failure: Secondary | ICD-10-CM | POA: Diagnosis not present

## 2019-08-19 DIAGNOSIS — L89154 Pressure ulcer of sacral region, stage 4: Secondary | ICD-10-CM | POA: Diagnosis not present

## 2019-08-19 NOTE — Telephone Encounter (Signed)
An attempt was made to reach out to the patient and his daughter regarding the request to complete HHA form for wound dressing from [facility].  However, I could not reach the patient or his daughter to clarify or confirm the need for this service.   I am aware that he was recently discharged from the hospital with a decubitus ulcer, hence the need for wound dressing.  I also received letters stating that the [facility] service could not reach the patient since no one was home to respond to them.  I cannot complete this form since I have not seen the patient in a while, and I could not reach him to confirm the continual need for [facility] nursing services.  I called [facility] and informed them that I am unable to complete the form based on the above reason and that I will go ahead and shred the form.  They will let me know when they are able to connect with the patient and resend the form if needed.

## 2019-08-19 NOTE — Telephone Encounter (Signed)
Called again twice, no response.  Please help him schedule an appointment with me when next he calls for his HHA nursing services for wound care.

## 2019-08-20 DIAGNOSIS — N186 End stage renal disease: Secondary | ICD-10-CM | POA: Diagnosis not present

## 2019-08-20 DIAGNOSIS — N2581 Secondary hyperparathyroidism of renal origin: Secondary | ICD-10-CM | POA: Diagnosis not present

## 2019-08-20 DIAGNOSIS — E1121 Type 2 diabetes mellitus with diabetic nephropathy: Secondary | ICD-10-CM | POA: Diagnosis not present

## 2019-08-20 DIAGNOSIS — D631 Anemia in chronic kidney disease: Secondary | ICD-10-CM | POA: Diagnosis not present

## 2019-08-20 DIAGNOSIS — Z992 Dependence on renal dialysis: Secondary | ICD-10-CM | POA: Diagnosis not present

## 2019-08-23 DIAGNOSIS — M109 Gout, unspecified: Secondary | ICD-10-CM | POA: Diagnosis not present

## 2019-08-23 DIAGNOSIS — L89154 Pressure ulcer of sacral region, stage 4: Secondary | ICD-10-CM | POA: Diagnosis not present

## 2019-08-23 DIAGNOSIS — Z992 Dependence on renal dialysis: Secondary | ICD-10-CM | POA: Diagnosis not present

## 2019-08-23 DIAGNOSIS — E1121 Type 2 diabetes mellitus with diabetic nephropathy: Secondary | ICD-10-CM | POA: Diagnosis not present

## 2019-08-23 DIAGNOSIS — N186 End stage renal disease: Secondary | ICD-10-CM | POA: Diagnosis not present

## 2019-08-23 DIAGNOSIS — I132 Hypertensive heart and chronic kidney disease with heart failure and with stage 5 chronic kidney disease, or end stage renal disease: Secondary | ICD-10-CM | POA: Diagnosis not present

## 2019-08-23 DIAGNOSIS — I5032 Chronic diastolic (congestive) heart failure: Secondary | ICD-10-CM | POA: Diagnosis not present

## 2019-08-23 DIAGNOSIS — E1122 Type 2 diabetes mellitus with diabetic chronic kidney disease: Secondary | ICD-10-CM | POA: Diagnosis not present

## 2019-08-23 DIAGNOSIS — N2581 Secondary hyperparathyroidism of renal origin: Secondary | ICD-10-CM | POA: Diagnosis not present

## 2019-08-23 DIAGNOSIS — D631 Anemia in chronic kidney disease: Secondary | ICD-10-CM | POA: Diagnosis not present

## 2019-08-24 DIAGNOSIS — M109 Gout, unspecified: Secondary | ICD-10-CM | POA: Diagnosis not present

## 2019-08-24 DIAGNOSIS — I5032 Chronic diastolic (congestive) heart failure: Secondary | ICD-10-CM | POA: Diagnosis not present

## 2019-08-24 DIAGNOSIS — L89154 Pressure ulcer of sacral region, stage 4: Secondary | ICD-10-CM | POA: Diagnosis not present

## 2019-08-24 DIAGNOSIS — I132 Hypertensive heart and chronic kidney disease with heart failure and with stage 5 chronic kidney disease, or end stage renal disease: Secondary | ICD-10-CM | POA: Diagnosis not present

## 2019-08-24 DIAGNOSIS — E1122 Type 2 diabetes mellitus with diabetic chronic kidney disease: Secondary | ICD-10-CM | POA: Diagnosis not present

## 2019-08-24 DIAGNOSIS — N186 End stage renal disease: Secondary | ICD-10-CM | POA: Diagnosis not present

## 2019-08-25 DIAGNOSIS — D631 Anemia in chronic kidney disease: Secondary | ICD-10-CM | POA: Diagnosis not present

## 2019-08-25 DIAGNOSIS — Z992 Dependence on renal dialysis: Secondary | ICD-10-CM | POA: Diagnosis not present

## 2019-08-25 DIAGNOSIS — N186 End stage renal disease: Secondary | ICD-10-CM | POA: Diagnosis not present

## 2019-08-25 DIAGNOSIS — N2581 Secondary hyperparathyroidism of renal origin: Secondary | ICD-10-CM | POA: Diagnosis not present

## 2019-08-25 DIAGNOSIS — E1121 Type 2 diabetes mellitus with diabetic nephropathy: Secondary | ICD-10-CM | POA: Diagnosis not present

## 2019-08-26 ENCOUNTER — Other Ambulatory Visit: Payer: Self-pay | Admitting: Adult Health

## 2019-08-26 ENCOUNTER — Other Ambulatory Visit: Payer: Medicare Other

## 2019-08-26 DIAGNOSIS — M109 Gout, unspecified: Secondary | ICD-10-CM | POA: Diagnosis not present

## 2019-08-26 DIAGNOSIS — E1122 Type 2 diabetes mellitus with diabetic chronic kidney disease: Secondary | ICD-10-CM | POA: Diagnosis not present

## 2019-08-26 DIAGNOSIS — E782 Mixed hyperlipidemia: Secondary | ICD-10-CM

## 2019-08-26 DIAGNOSIS — I132 Hypertensive heart and chronic kidney disease with heart failure and with stage 5 chronic kidney disease, or end stage renal disease: Secondary | ICD-10-CM | POA: Diagnosis not present

## 2019-08-26 DIAGNOSIS — L89154 Pressure ulcer of sacral region, stage 4: Secondary | ICD-10-CM | POA: Diagnosis not present

## 2019-08-26 DIAGNOSIS — N186 End stage renal disease: Secondary | ICD-10-CM | POA: Diagnosis not present

## 2019-08-26 DIAGNOSIS — I152 Hypertension secondary to endocrine disorders: Secondary | ICD-10-CM

## 2019-08-26 DIAGNOSIS — E1159 Type 2 diabetes mellitus with other circulatory complications: Secondary | ICD-10-CM

## 2019-08-26 DIAGNOSIS — I5032 Chronic diastolic (congestive) heart failure: Secondary | ICD-10-CM | POA: Diagnosis not present

## 2019-08-26 DIAGNOSIS — Z8739 Personal history of other diseases of the musculoskeletal system and connective tissue: Secondary | ICD-10-CM

## 2019-08-26 DIAGNOSIS — R630 Anorexia: Secondary | ICD-10-CM

## 2019-08-27 DIAGNOSIS — N186 End stage renal disease: Secondary | ICD-10-CM | POA: Diagnosis not present

## 2019-08-27 DIAGNOSIS — Z992 Dependence on renal dialysis: Secondary | ICD-10-CM | POA: Diagnosis not present

## 2019-08-27 DIAGNOSIS — D631 Anemia in chronic kidney disease: Secondary | ICD-10-CM | POA: Diagnosis not present

## 2019-08-27 DIAGNOSIS — N2581 Secondary hyperparathyroidism of renal origin: Secondary | ICD-10-CM | POA: Diagnosis not present

## 2019-08-27 DIAGNOSIS — E1121 Type 2 diabetes mellitus with diabetic nephropathy: Secondary | ICD-10-CM | POA: Diagnosis not present

## 2019-08-30 DIAGNOSIS — E1121 Type 2 diabetes mellitus with diabetic nephropathy: Secondary | ICD-10-CM | POA: Diagnosis not present

## 2019-08-30 DIAGNOSIS — Z992 Dependence on renal dialysis: Secondary | ICD-10-CM | POA: Diagnosis not present

## 2019-08-30 DIAGNOSIS — D631 Anemia in chronic kidney disease: Secondary | ICD-10-CM | POA: Diagnosis not present

## 2019-08-30 DIAGNOSIS — N2581 Secondary hyperparathyroidism of renal origin: Secondary | ICD-10-CM | POA: Diagnosis not present

## 2019-08-30 DIAGNOSIS — N186 End stage renal disease: Secondary | ICD-10-CM | POA: Diagnosis not present

## 2019-09-01 ENCOUNTER — Other Ambulatory Visit: Payer: Self-pay

## 2019-09-01 DIAGNOSIS — Z992 Dependence on renal dialysis: Secondary | ICD-10-CM | POA: Diagnosis not present

## 2019-09-01 DIAGNOSIS — E1121 Type 2 diabetes mellitus with diabetic nephropathy: Secondary | ICD-10-CM | POA: Diagnosis not present

## 2019-09-01 DIAGNOSIS — Z8739 Personal history of other diseases of the musculoskeletal system and connective tissue: Secondary | ICD-10-CM

## 2019-09-01 DIAGNOSIS — E1159 Type 2 diabetes mellitus with other circulatory complications: Secondary | ICD-10-CM

## 2019-09-01 DIAGNOSIS — N2581 Secondary hyperparathyroidism of renal origin: Secondary | ICD-10-CM | POA: Diagnosis not present

## 2019-09-01 DIAGNOSIS — R630 Anorexia: Secondary | ICD-10-CM

## 2019-09-01 DIAGNOSIS — D631 Anemia in chronic kidney disease: Secondary | ICD-10-CM | POA: Diagnosis not present

## 2019-09-01 DIAGNOSIS — N186 End stage renal disease: Secondary | ICD-10-CM | POA: Diagnosis not present

## 2019-09-01 DIAGNOSIS — E782 Mixed hyperlipidemia: Secondary | ICD-10-CM

## 2019-09-02 DIAGNOSIS — E1122 Type 2 diabetes mellitus with diabetic chronic kidney disease: Secondary | ICD-10-CM | POA: Diagnosis not present

## 2019-09-02 DIAGNOSIS — L89154 Pressure ulcer of sacral region, stage 4: Secondary | ICD-10-CM | POA: Diagnosis not present

## 2019-09-02 DIAGNOSIS — M109 Gout, unspecified: Secondary | ICD-10-CM | POA: Diagnosis not present

## 2019-09-02 DIAGNOSIS — N186 End stage renal disease: Secondary | ICD-10-CM | POA: Diagnosis not present

## 2019-09-02 DIAGNOSIS — Z23 Encounter for immunization: Secondary | ICD-10-CM | POA: Diagnosis not present

## 2019-09-02 DIAGNOSIS — I132 Hypertensive heart and chronic kidney disease with heart failure and with stage 5 chronic kidney disease, or end stage renal disease: Secondary | ICD-10-CM | POA: Diagnosis not present

## 2019-09-02 DIAGNOSIS — I5032 Chronic diastolic (congestive) heart failure: Secondary | ICD-10-CM | POA: Diagnosis not present

## 2019-09-02 MED ORDER — ALLOPURINOL 100 MG PO TABS: 100.0000 mg | ORAL_TABLET | Freq: Two times a day (BID) | ORAL | 4 refills | Status: DC

## 2019-09-02 MED ORDER — PRO-STAT SUGAR FREE PO LIQD: 30.0000 mL | Freq: Two times a day (BID) | ORAL | 6 refills | Status: DC

## 2019-09-02 MED ORDER — CARVEDILOL 6.25 MG PO TABS: 6.2500 mg | ORAL_TABLET | Freq: Two times a day (BID) | ORAL | 4 refills | Status: DC

## 2019-09-02 MED ORDER — LEVETIRACETAM 250 MG PO TABS: 250.0000 mg | ORAL_TABLET | Freq: Two times a day (BID) | ORAL | 4 refills | Status: DC

## 2019-09-03 ENCOUNTER — Other Ambulatory Visit: Payer: Self-pay

## 2019-09-03 DIAGNOSIS — R630 Anorexia: Secondary | ICD-10-CM

## 2019-09-03 DIAGNOSIS — D631 Anemia in chronic kidney disease: Secondary | ICD-10-CM | POA: Diagnosis not present

## 2019-09-03 DIAGNOSIS — E782 Mixed hyperlipidemia: Secondary | ICD-10-CM

## 2019-09-03 DIAGNOSIS — N186 End stage renal disease: Secondary | ICD-10-CM | POA: Diagnosis not present

## 2019-09-03 DIAGNOSIS — E1121 Type 2 diabetes mellitus with diabetic nephropathy: Secondary | ICD-10-CM | POA: Diagnosis not present

## 2019-09-03 DIAGNOSIS — N2581 Secondary hyperparathyroidism of renal origin: Secondary | ICD-10-CM | POA: Diagnosis not present

## 2019-09-03 DIAGNOSIS — Z992 Dependence on renal dialysis: Secondary | ICD-10-CM | POA: Diagnosis not present

## 2019-09-04 MED ORDER — MIRTAZAPINE 15 MG PO TABS: 15.0000 mg | ORAL_TABLET | Freq: Every day | ORAL | 1 refills | Status: DC

## 2019-09-04 MED ORDER — CETIRIZINE HCL 5 MG PO TABS: 5.0000 mg | ORAL_TABLET | Freq: Every day | ORAL | 1 refills | Status: DC

## 2019-09-04 MED ORDER — ATORVASTATIN CALCIUM 80 MG PO TABS: 80.0000 mg | ORAL_TABLET | Freq: Every day | ORAL | 1 refills | Status: DC

## 2019-09-05 DIAGNOSIS — I252 Old myocardial infarction: Secondary | ICD-10-CM | POA: Diagnosis not present

## 2019-09-05 DIAGNOSIS — M109 Gout, unspecified: Secondary | ICD-10-CM | POA: Diagnosis not present

## 2019-09-05 DIAGNOSIS — E46 Unspecified protein-calorie malnutrition: Secondary | ICD-10-CM | POA: Diagnosis not present

## 2019-09-05 DIAGNOSIS — E782 Mixed hyperlipidemia: Secondary | ICD-10-CM | POA: Diagnosis not present

## 2019-09-05 DIAGNOSIS — N186 End stage renal disease: Secondary | ICD-10-CM | POA: Diagnosis not present

## 2019-09-05 DIAGNOSIS — Z992 Dependence on renal dialysis: Secondary | ICD-10-CM | POA: Diagnosis not present

## 2019-09-05 DIAGNOSIS — G253 Myoclonus: Secondary | ICD-10-CM | POA: Diagnosis not present

## 2019-09-05 DIAGNOSIS — I340 Nonrheumatic mitral (valve) insufficiency: Secondary | ICD-10-CM | POA: Diagnosis not present

## 2019-09-05 DIAGNOSIS — Z8674 Personal history of sudden cardiac arrest: Secondary | ICD-10-CM | POA: Diagnosis not present

## 2019-09-05 DIAGNOSIS — G931 Anoxic brain damage, not elsewhere classified: Secondary | ICD-10-CM | POA: Diagnosis not present

## 2019-09-05 DIAGNOSIS — G4733 Obstructive sleep apnea (adult) (pediatric): Secondary | ICD-10-CM | POA: Diagnosis not present

## 2019-09-05 DIAGNOSIS — Z9181 History of falling: Secondary | ICD-10-CM | POA: Diagnosis not present

## 2019-09-05 DIAGNOSIS — I2510 Atherosclerotic heart disease of native coronary artery without angina pectoris: Secondary | ICD-10-CM | POA: Diagnosis not present

## 2019-09-05 DIAGNOSIS — I272 Pulmonary hypertension, unspecified: Secondary | ICD-10-CM | POA: Diagnosis not present

## 2019-09-05 DIAGNOSIS — I5032 Chronic diastolic (congestive) heart failure: Secondary | ICD-10-CM | POA: Diagnosis not present

## 2019-09-05 DIAGNOSIS — L89154 Pressure ulcer of sacral region, stage 4: Secondary | ICD-10-CM | POA: Diagnosis not present

## 2019-09-05 DIAGNOSIS — Z8616 Personal history of COVID-19: Secondary | ICD-10-CM | POA: Diagnosis not present

## 2019-09-05 DIAGNOSIS — I132 Hypertensive heart and chronic kidney disease with heart failure and with stage 5 chronic kidney disease, or end stage renal disease: Secondary | ICD-10-CM | POA: Diagnosis not present

## 2019-09-05 DIAGNOSIS — Z8701 Personal history of pneumonia (recurrent): Secondary | ICD-10-CM | POA: Diagnosis not present

## 2019-09-05 DIAGNOSIS — R41841 Cognitive communication deficit: Secondary | ICD-10-CM | POA: Diagnosis not present

## 2019-09-05 DIAGNOSIS — E1159 Type 2 diabetes mellitus with other circulatory complications: Secondary | ICD-10-CM | POA: Diagnosis not present

## 2019-09-05 DIAGNOSIS — Z86010 Personal history of colonic polyps: Secondary | ICD-10-CM | POA: Diagnosis not present

## 2019-09-05 DIAGNOSIS — E1140 Type 2 diabetes mellitus with diabetic neuropathy, unspecified: Secondary | ICD-10-CM | POA: Diagnosis not present

## 2019-09-05 DIAGNOSIS — E1122 Type 2 diabetes mellitus with diabetic chronic kidney disease: Secondary | ICD-10-CM | POA: Diagnosis not present

## 2019-09-05 DIAGNOSIS — K5730 Diverticulosis of large intestine without perforation or abscess without bleeding: Secondary | ICD-10-CM | POA: Diagnosis not present

## 2019-09-06 DIAGNOSIS — N186 End stage renal disease: Secondary | ICD-10-CM | POA: Diagnosis not present

## 2019-09-06 DIAGNOSIS — D631 Anemia in chronic kidney disease: Secondary | ICD-10-CM | POA: Diagnosis not present

## 2019-09-06 DIAGNOSIS — E1121 Type 2 diabetes mellitus with diabetic nephropathy: Secondary | ICD-10-CM | POA: Diagnosis not present

## 2019-09-06 DIAGNOSIS — Z992 Dependence on renal dialysis: Secondary | ICD-10-CM | POA: Diagnosis not present

## 2019-09-06 DIAGNOSIS — N2581 Secondary hyperparathyroidism of renal origin: Secondary | ICD-10-CM | POA: Diagnosis not present

## 2019-09-07 ENCOUNTER — Encounter: Payer: Self-pay | Admitting: *Deleted

## 2019-09-07 ENCOUNTER — Encounter: Payer: Self-pay | Admitting: Family Medicine

## 2019-09-07 DIAGNOSIS — N186 End stage renal disease: Secondary | ICD-10-CM | POA: Diagnosis not present

## 2019-09-07 DIAGNOSIS — L89154 Pressure ulcer of sacral region, stage 4: Secondary | ICD-10-CM | POA: Diagnosis not present

## 2019-09-07 DIAGNOSIS — M109 Gout, unspecified: Secondary | ICD-10-CM | POA: Diagnosis not present

## 2019-09-07 DIAGNOSIS — I132 Hypertensive heart and chronic kidney disease with heart failure and with stage 5 chronic kidney disease, or end stage renal disease: Secondary | ICD-10-CM | POA: Diagnosis not present

## 2019-09-07 DIAGNOSIS — E1122 Type 2 diabetes mellitus with diabetic chronic kidney disease: Secondary | ICD-10-CM | POA: Diagnosis not present

## 2019-09-07 DIAGNOSIS — I5032 Chronic diastolic (congestive) heart failure: Secondary | ICD-10-CM | POA: Diagnosis not present

## 2019-09-07 NOTE — Progress Notes (Signed)
Letter mailed asking patient to reach out to [place] to make an appointment in April.  Since there have been multiple attempts made by provider and messages left for him to call back, opted to mail a letter to try and reach patient. Baden Betsch,CMA

## 2019-09-07 NOTE — Progress Notes (Signed)
I have been receiving HHA/PT form from [facility], which I signed. However, lately, I have also received a missed visits report since the patient did not respond to their doorbell. I have attempted to reach the patient as well with no success.  Two days ago, I received another [facility] HHA form to be completed. I have tried multiple times to reach someone at the number provided on the form with no luck. A message was left for them to call back.   At this point, I am uncertain if he is still receiving all these services or if he still needs them. I will like for him to follow-up with me soon.

## 2019-09-08 DIAGNOSIS — E1121 Type 2 diabetes mellitus with diabetic nephropathy: Secondary | ICD-10-CM | POA: Diagnosis not present

## 2019-09-08 DIAGNOSIS — N2581 Secondary hyperparathyroidism of renal origin: Secondary | ICD-10-CM | POA: Diagnosis not present

## 2019-09-08 DIAGNOSIS — N186 End stage renal disease: Secondary | ICD-10-CM | POA: Diagnosis not present

## 2019-09-08 DIAGNOSIS — Z992 Dependence on renal dialysis: Secondary | ICD-10-CM | POA: Diagnosis not present

## 2019-09-08 DIAGNOSIS — D631 Anemia in chronic kidney disease: Secondary | ICD-10-CM | POA: Diagnosis not present

## 2019-09-09 DIAGNOSIS — I5032 Chronic diastolic (congestive) heart failure: Secondary | ICD-10-CM | POA: Diagnosis not present

## 2019-09-09 DIAGNOSIS — M109 Gout, unspecified: Secondary | ICD-10-CM | POA: Diagnosis not present

## 2019-09-09 DIAGNOSIS — N186 End stage renal disease: Secondary | ICD-10-CM | POA: Diagnosis not present

## 2019-09-09 DIAGNOSIS — E1122 Type 2 diabetes mellitus with diabetic chronic kidney disease: Secondary | ICD-10-CM | POA: Diagnosis not present

## 2019-09-09 DIAGNOSIS — I132 Hypertensive heart and chronic kidney disease with heart failure and with stage 5 chronic kidney disease, or end stage renal disease: Secondary | ICD-10-CM | POA: Diagnosis not present

## 2019-09-09 DIAGNOSIS — L89154 Pressure ulcer of sacral region, stage 4: Secondary | ICD-10-CM | POA: Diagnosis not present

## 2019-09-10 DIAGNOSIS — N186 End stage renal disease: Secondary | ICD-10-CM | POA: Diagnosis not present

## 2019-09-10 DIAGNOSIS — Z992 Dependence on renal dialysis: Secondary | ICD-10-CM | POA: Diagnosis not present

## 2019-09-10 DIAGNOSIS — D631 Anemia in chronic kidney disease: Secondary | ICD-10-CM | POA: Diagnosis not present

## 2019-09-10 DIAGNOSIS — N2581 Secondary hyperparathyroidism of renal origin: Secondary | ICD-10-CM | POA: Diagnosis not present

## 2019-09-10 DIAGNOSIS — E1121 Type 2 diabetes mellitus with diabetic nephropathy: Secondary | ICD-10-CM | POA: Diagnosis not present

## 2019-09-13 DIAGNOSIS — N186 End stage renal disease: Secondary | ICD-10-CM | POA: Diagnosis not present

## 2019-09-13 DIAGNOSIS — E1121 Type 2 diabetes mellitus with diabetic nephropathy: Secondary | ICD-10-CM | POA: Diagnosis not present

## 2019-09-13 DIAGNOSIS — D631 Anemia in chronic kidney disease: Secondary | ICD-10-CM | POA: Diagnosis not present

## 2019-09-13 DIAGNOSIS — N2581 Secondary hyperparathyroidism of renal origin: Secondary | ICD-10-CM | POA: Diagnosis not present

## 2019-09-13 DIAGNOSIS — Z992 Dependence on renal dialysis: Secondary | ICD-10-CM | POA: Diagnosis not present

## 2019-09-14 DIAGNOSIS — L89154 Pressure ulcer of sacral region, stage 4: Secondary | ICD-10-CM | POA: Diagnosis not present

## 2019-09-14 DIAGNOSIS — E1122 Type 2 diabetes mellitus with diabetic chronic kidney disease: Secondary | ICD-10-CM | POA: Diagnosis not present

## 2019-09-14 DIAGNOSIS — I5032 Chronic diastolic (congestive) heart failure: Secondary | ICD-10-CM | POA: Diagnosis not present

## 2019-09-14 DIAGNOSIS — I132 Hypertensive heart and chronic kidney disease with heart failure and with stage 5 chronic kidney disease, or end stage renal disease: Secondary | ICD-10-CM | POA: Diagnosis not present

## 2019-09-14 DIAGNOSIS — N186 End stage renal disease: Secondary | ICD-10-CM | POA: Diagnosis not present

## 2019-09-14 DIAGNOSIS — M109 Gout, unspecified: Secondary | ICD-10-CM | POA: Diagnosis not present

## 2019-09-15 DIAGNOSIS — E1121 Type 2 diabetes mellitus with diabetic nephropathy: Secondary | ICD-10-CM | POA: Diagnosis not present

## 2019-09-15 DIAGNOSIS — Z992 Dependence on renal dialysis: Secondary | ICD-10-CM | POA: Diagnosis not present

## 2019-09-15 DIAGNOSIS — D631 Anemia in chronic kidney disease: Secondary | ICD-10-CM | POA: Diagnosis not present

## 2019-09-15 DIAGNOSIS — N2581 Secondary hyperparathyroidism of renal origin: Secondary | ICD-10-CM | POA: Diagnosis not present

## 2019-09-15 DIAGNOSIS — N186 End stage renal disease: Secondary | ICD-10-CM | POA: Diagnosis not present

## 2019-09-16 DIAGNOSIS — E1122 Type 2 diabetes mellitus with diabetic chronic kidney disease: Secondary | ICD-10-CM | POA: Diagnosis not present

## 2019-09-16 DIAGNOSIS — L89154 Pressure ulcer of sacral region, stage 4: Secondary | ICD-10-CM | POA: Diagnosis not present

## 2019-09-16 DIAGNOSIS — M109 Gout, unspecified: Secondary | ICD-10-CM | POA: Diagnosis not present

## 2019-09-16 DIAGNOSIS — N186 End stage renal disease: Secondary | ICD-10-CM | POA: Diagnosis not present

## 2019-09-16 DIAGNOSIS — I5032 Chronic diastolic (congestive) heart failure: Secondary | ICD-10-CM | POA: Diagnosis not present

## 2019-09-16 DIAGNOSIS — I132 Hypertensive heart and chronic kidney disease with heart failure and with stage 5 chronic kidney disease, or end stage renal disease: Secondary | ICD-10-CM | POA: Diagnosis not present

## 2019-09-16 DIAGNOSIS — D631 Anemia in chronic kidney disease: Secondary | ICD-10-CM | POA: Diagnosis not present

## 2019-09-16 DIAGNOSIS — E1121 Type 2 diabetes mellitus with diabetic nephropathy: Secondary | ICD-10-CM | POA: Diagnosis not present

## 2019-09-16 DIAGNOSIS — Z992 Dependence on renal dialysis: Secondary | ICD-10-CM | POA: Diagnosis not present

## 2019-09-16 DIAGNOSIS — N2581 Secondary hyperparathyroidism of renal origin: Secondary | ICD-10-CM | POA: Diagnosis not present

## 2019-09-17 DIAGNOSIS — N186 End stage renal disease: Secondary | ICD-10-CM | POA: Diagnosis not present

## 2019-09-17 DIAGNOSIS — Z992 Dependence on renal dialysis: Secondary | ICD-10-CM | POA: Diagnosis not present

## 2019-09-17 DIAGNOSIS — E1121 Type 2 diabetes mellitus with diabetic nephropathy: Secondary | ICD-10-CM | POA: Diagnosis not present

## 2019-09-17 DIAGNOSIS — D631 Anemia in chronic kidney disease: Secondary | ICD-10-CM | POA: Diagnosis not present

## 2019-09-17 DIAGNOSIS — N2581 Secondary hyperparathyroidism of renal origin: Secondary | ICD-10-CM | POA: Diagnosis not present

## 2019-09-20 DIAGNOSIS — N186 End stage renal disease: Secondary | ICD-10-CM | POA: Diagnosis not present

## 2019-09-20 DIAGNOSIS — E1121 Type 2 diabetes mellitus with diabetic nephropathy: Secondary | ICD-10-CM | POA: Diagnosis not present

## 2019-09-20 DIAGNOSIS — Z992 Dependence on renal dialysis: Secondary | ICD-10-CM | POA: Diagnosis not present

## 2019-09-20 DIAGNOSIS — D631 Anemia in chronic kidney disease: Secondary | ICD-10-CM | POA: Diagnosis not present

## 2019-09-20 DIAGNOSIS — N2581 Secondary hyperparathyroidism of renal origin: Secondary | ICD-10-CM | POA: Diagnosis not present

## 2019-09-21 DIAGNOSIS — L89154 Pressure ulcer of sacral region, stage 4: Secondary | ICD-10-CM | POA: Diagnosis not present

## 2019-09-21 DIAGNOSIS — E1122 Type 2 diabetes mellitus with diabetic chronic kidney disease: Secondary | ICD-10-CM | POA: Diagnosis not present

## 2019-09-21 DIAGNOSIS — N186 End stage renal disease: Secondary | ICD-10-CM | POA: Diagnosis not present

## 2019-09-21 DIAGNOSIS — M109 Gout, unspecified: Secondary | ICD-10-CM | POA: Diagnosis not present

## 2019-09-21 DIAGNOSIS — I5032 Chronic diastolic (congestive) heart failure: Secondary | ICD-10-CM | POA: Diagnosis not present

## 2019-09-21 DIAGNOSIS — I132 Hypertensive heart and chronic kidney disease with heart failure and with stage 5 chronic kidney disease, or end stage renal disease: Secondary | ICD-10-CM | POA: Diagnosis not present

## 2019-09-22 DIAGNOSIS — D631 Anemia in chronic kidney disease: Secondary | ICD-10-CM | POA: Diagnosis not present

## 2019-09-22 DIAGNOSIS — N186 End stage renal disease: Secondary | ICD-10-CM | POA: Diagnosis not present

## 2019-09-22 DIAGNOSIS — Z992 Dependence on renal dialysis: Secondary | ICD-10-CM | POA: Diagnosis not present

## 2019-09-22 DIAGNOSIS — N2581 Secondary hyperparathyroidism of renal origin: Secondary | ICD-10-CM | POA: Diagnosis not present

## 2019-09-22 DIAGNOSIS — E1121 Type 2 diabetes mellitus with diabetic nephropathy: Secondary | ICD-10-CM | POA: Diagnosis not present

## 2019-09-23 DIAGNOSIS — I5032 Chronic diastolic (congestive) heart failure: Secondary | ICD-10-CM | POA: Diagnosis not present

## 2019-09-23 DIAGNOSIS — E1122 Type 2 diabetes mellitus with diabetic chronic kidney disease: Secondary | ICD-10-CM | POA: Diagnosis not present

## 2019-09-23 DIAGNOSIS — I132 Hypertensive heart and chronic kidney disease with heart failure and with stage 5 chronic kidney disease, or end stage renal disease: Secondary | ICD-10-CM | POA: Diagnosis not present

## 2019-09-23 DIAGNOSIS — M109 Gout, unspecified: Secondary | ICD-10-CM | POA: Diagnosis not present

## 2019-09-23 DIAGNOSIS — L89154 Pressure ulcer of sacral region, stage 4: Secondary | ICD-10-CM | POA: Diagnosis not present

## 2019-09-23 DIAGNOSIS — N186 End stage renal disease: Secondary | ICD-10-CM | POA: Diagnosis not present

## 2019-09-24 DIAGNOSIS — N2581 Secondary hyperparathyroidism of renal origin: Secondary | ICD-10-CM | POA: Diagnosis not present

## 2019-09-24 DIAGNOSIS — N186 End stage renal disease: Secondary | ICD-10-CM | POA: Diagnosis not present

## 2019-09-24 DIAGNOSIS — E1121 Type 2 diabetes mellitus with diabetic nephropathy: Secondary | ICD-10-CM | POA: Diagnosis not present

## 2019-09-24 DIAGNOSIS — Z992 Dependence on renal dialysis: Secondary | ICD-10-CM | POA: Diagnosis not present

## 2019-09-24 DIAGNOSIS — D631 Anemia in chronic kidney disease: Secondary | ICD-10-CM | POA: Diagnosis not present

## 2019-09-27 DIAGNOSIS — E1121 Type 2 diabetes mellitus with diabetic nephropathy: Secondary | ICD-10-CM | POA: Diagnosis not present

## 2019-09-27 DIAGNOSIS — N2581 Secondary hyperparathyroidism of renal origin: Secondary | ICD-10-CM | POA: Diagnosis not present

## 2019-09-27 DIAGNOSIS — D631 Anemia in chronic kidney disease: Secondary | ICD-10-CM | POA: Diagnosis not present

## 2019-09-27 DIAGNOSIS — N186 End stage renal disease: Secondary | ICD-10-CM | POA: Diagnosis not present

## 2019-09-27 DIAGNOSIS — Z992 Dependence on renal dialysis: Secondary | ICD-10-CM | POA: Diagnosis not present

## 2019-09-28 DIAGNOSIS — N186 End stage renal disease: Secondary | ICD-10-CM | POA: Diagnosis not present

## 2019-09-28 DIAGNOSIS — I5032 Chronic diastolic (congestive) heart failure: Secondary | ICD-10-CM | POA: Diagnosis not present

## 2019-09-28 DIAGNOSIS — E1122 Type 2 diabetes mellitus with diabetic chronic kidney disease: Secondary | ICD-10-CM | POA: Diagnosis not present

## 2019-09-28 DIAGNOSIS — I132 Hypertensive heart and chronic kidney disease with heart failure and with stage 5 chronic kidney disease, or end stage renal disease: Secondary | ICD-10-CM | POA: Diagnosis not present

## 2019-09-28 DIAGNOSIS — L89154 Pressure ulcer of sacral region, stage 4: Secondary | ICD-10-CM | POA: Diagnosis not present

## 2019-09-28 DIAGNOSIS — M109 Gout, unspecified: Secondary | ICD-10-CM | POA: Diagnosis not present

## 2019-09-29 DIAGNOSIS — N186 End stage renal disease: Secondary | ICD-10-CM | POA: Diagnosis not present

## 2019-09-29 DIAGNOSIS — Z992 Dependence on renal dialysis: Secondary | ICD-10-CM | POA: Diagnosis not present

## 2019-09-29 DIAGNOSIS — E1121 Type 2 diabetes mellitus with diabetic nephropathy: Secondary | ICD-10-CM | POA: Diagnosis not present

## 2019-09-29 DIAGNOSIS — D631 Anemia in chronic kidney disease: Secondary | ICD-10-CM | POA: Diagnosis not present

## 2019-09-29 DIAGNOSIS — N2581 Secondary hyperparathyroidism of renal origin: Secondary | ICD-10-CM | POA: Diagnosis not present

## 2019-09-30 ENCOUNTER — Ambulatory Visit (INDEPENDENT_AMBULATORY_CARE_PROVIDER_SITE_OTHER): Payer: Medicare Other | Admitting: Neurology

## 2019-09-30 ENCOUNTER — Other Ambulatory Visit: Payer: Self-pay

## 2019-09-30 DIAGNOSIS — L89154 Pressure ulcer of sacral region, stage 4: Secondary | ICD-10-CM | POA: Diagnosis not present

## 2019-09-30 DIAGNOSIS — G253 Myoclonus: Secondary | ICD-10-CM

## 2019-09-30 DIAGNOSIS — G931 Anoxic brain damage, not elsewhere classified: Secondary | ICD-10-CM

## 2019-09-30 DIAGNOSIS — I5032 Chronic diastolic (congestive) heart failure: Secondary | ICD-10-CM | POA: Diagnosis not present

## 2019-09-30 DIAGNOSIS — R269 Unspecified abnormalities of gait and mobility: Secondary | ICD-10-CM

## 2019-09-30 DIAGNOSIS — N186 End stage renal disease: Secondary | ICD-10-CM | POA: Diagnosis not present

## 2019-09-30 DIAGNOSIS — E1122 Type 2 diabetes mellitus with diabetic chronic kidney disease: Secondary | ICD-10-CM | POA: Diagnosis not present

## 2019-09-30 DIAGNOSIS — I132 Hypertensive heart and chronic kidney disease with heart failure and with stage 5 chronic kidney disease, or end stage renal disease: Secondary | ICD-10-CM | POA: Diagnosis not present

## 2019-09-30 DIAGNOSIS — M109 Gout, unspecified: Secondary | ICD-10-CM | POA: Diagnosis not present

## 2019-09-30 DIAGNOSIS — R258 Other abnormal involuntary movements: Secondary | ICD-10-CM | POA: Diagnosis not present

## 2019-10-01 DIAGNOSIS — Z992 Dependence on renal dialysis: Secondary | ICD-10-CM | POA: Diagnosis not present

## 2019-10-01 DIAGNOSIS — N186 End stage renal disease: Secondary | ICD-10-CM | POA: Diagnosis not present

## 2019-10-01 DIAGNOSIS — D631 Anemia in chronic kidney disease: Secondary | ICD-10-CM | POA: Diagnosis not present

## 2019-10-01 DIAGNOSIS — N2581 Secondary hyperparathyroidism of renal origin: Secondary | ICD-10-CM | POA: Diagnosis not present

## 2019-10-01 DIAGNOSIS — E1121 Type 2 diabetes mellitus with diabetic nephropathy: Secondary | ICD-10-CM | POA: Diagnosis not present

## 2019-10-04 DIAGNOSIS — L89154 Pressure ulcer of sacral region, stage 4: Secondary | ICD-10-CM | POA: Diagnosis not present

## 2019-10-04 DIAGNOSIS — M109 Gout, unspecified: Secondary | ICD-10-CM | POA: Diagnosis not present

## 2019-10-04 DIAGNOSIS — D631 Anemia in chronic kidney disease: Secondary | ICD-10-CM | POA: Diagnosis not present

## 2019-10-04 DIAGNOSIS — Z992 Dependence on renal dialysis: Secondary | ICD-10-CM | POA: Diagnosis not present

## 2019-10-04 DIAGNOSIS — I132 Hypertensive heart and chronic kidney disease with heart failure and with stage 5 chronic kidney disease, or end stage renal disease: Secondary | ICD-10-CM | POA: Diagnosis not present

## 2019-10-04 DIAGNOSIS — I5032 Chronic diastolic (congestive) heart failure: Secondary | ICD-10-CM | POA: Diagnosis not present

## 2019-10-04 DIAGNOSIS — E1122 Type 2 diabetes mellitus with diabetic chronic kidney disease: Secondary | ICD-10-CM | POA: Diagnosis not present

## 2019-10-04 DIAGNOSIS — N186 End stage renal disease: Secondary | ICD-10-CM | POA: Diagnosis not present

## 2019-10-04 DIAGNOSIS — N2581 Secondary hyperparathyroidism of renal origin: Secondary | ICD-10-CM | POA: Diagnosis not present

## 2019-10-04 DIAGNOSIS — E1121 Type 2 diabetes mellitus with diabetic nephropathy: Secondary | ICD-10-CM | POA: Diagnosis not present

## 2019-10-05 DIAGNOSIS — G931 Anoxic brain damage, not elsewhere classified: Secondary | ICD-10-CM | POA: Diagnosis not present

## 2019-10-05 DIAGNOSIS — E1140 Type 2 diabetes mellitus with diabetic neuropathy, unspecified: Secondary | ICD-10-CM | POA: Diagnosis not present

## 2019-10-05 DIAGNOSIS — G253 Myoclonus: Secondary | ICD-10-CM | POA: Diagnosis not present

## 2019-10-05 DIAGNOSIS — Z8674 Personal history of sudden cardiac arrest: Secondary | ICD-10-CM | POA: Diagnosis not present

## 2019-10-05 DIAGNOSIS — G4733 Obstructive sleep apnea (adult) (pediatric): Secondary | ICD-10-CM | POA: Diagnosis not present

## 2019-10-05 DIAGNOSIS — Z8701 Personal history of pneumonia (recurrent): Secondary | ICD-10-CM | POA: Diagnosis not present

## 2019-10-05 DIAGNOSIS — L89154 Pressure ulcer of sacral region, stage 4: Secondary | ICD-10-CM | POA: Diagnosis not present

## 2019-10-05 DIAGNOSIS — E1159 Type 2 diabetes mellitus with other circulatory complications: Secondary | ICD-10-CM | POA: Diagnosis not present

## 2019-10-05 DIAGNOSIS — M109 Gout, unspecified: Secondary | ICD-10-CM | POA: Diagnosis not present

## 2019-10-05 DIAGNOSIS — Z992 Dependence on renal dialysis: Secondary | ICD-10-CM | POA: Diagnosis not present

## 2019-10-05 DIAGNOSIS — R41841 Cognitive communication deficit: Secondary | ICD-10-CM | POA: Diagnosis not present

## 2019-10-05 DIAGNOSIS — I132 Hypertensive heart and chronic kidney disease with heart failure and with stage 5 chronic kidney disease, or end stage renal disease: Secondary | ICD-10-CM | POA: Diagnosis not present

## 2019-10-05 DIAGNOSIS — Z86010 Personal history of colonic polyps: Secondary | ICD-10-CM | POA: Diagnosis not present

## 2019-10-05 DIAGNOSIS — E46 Unspecified protein-calorie malnutrition: Secondary | ICD-10-CM | POA: Diagnosis not present

## 2019-10-05 DIAGNOSIS — E782 Mixed hyperlipidemia: Secondary | ICD-10-CM | POA: Diagnosis not present

## 2019-10-05 DIAGNOSIS — Z9181 History of falling: Secondary | ICD-10-CM | POA: Diagnosis not present

## 2019-10-05 DIAGNOSIS — I2510 Atherosclerotic heart disease of native coronary artery without angina pectoris: Secondary | ICD-10-CM | POA: Diagnosis not present

## 2019-10-05 DIAGNOSIS — I5032 Chronic diastolic (congestive) heart failure: Secondary | ICD-10-CM | POA: Diagnosis not present

## 2019-10-05 DIAGNOSIS — K5730 Diverticulosis of large intestine without perforation or abscess without bleeding: Secondary | ICD-10-CM | POA: Diagnosis not present

## 2019-10-05 DIAGNOSIS — I340 Nonrheumatic mitral (valve) insufficiency: Secondary | ICD-10-CM | POA: Diagnosis not present

## 2019-10-05 DIAGNOSIS — Z8616 Personal history of COVID-19: Secondary | ICD-10-CM | POA: Diagnosis not present

## 2019-10-05 DIAGNOSIS — E1122 Type 2 diabetes mellitus with diabetic chronic kidney disease: Secondary | ICD-10-CM | POA: Diagnosis not present

## 2019-10-05 DIAGNOSIS — I272 Pulmonary hypertension, unspecified: Secondary | ICD-10-CM | POA: Diagnosis not present

## 2019-10-05 DIAGNOSIS — I252 Old myocardial infarction: Secondary | ICD-10-CM | POA: Diagnosis not present

## 2019-10-05 DIAGNOSIS — N186 End stage renal disease: Secondary | ICD-10-CM | POA: Diagnosis not present

## 2019-10-05 NOTE — Procedures (Signed)
   HISTORY: 54 year old male hemodialysis patient, suffered COVID-19 pneumonia in October, required prolonged hospital admission, intubation, also suffered PEA, he now continues to have frequent body jerking.  TECHNIQUE:  This is a routine 16 channel EEG recording with one channel devoted to a limited EKG recording.  It was performed during wakefulness, drowsiness and asleep.  Hyperventilation and photic stimulation were performed as activating procedures.  There are minimum muscle and movement artifact noted.  Upon maximum arousal, posterior dominant waking rhythm consistent of rhythmic alpha range activity, with frequency of 9 hz. Activities are symmetric over the bilateral posterior derivations and attenuated with eye opening.  Hyperventilation produced mild/moderate buildup with higher amplitude and the slower activities noted.  Photic stimulation did not alter the tracing.  During EEG recording, patient developed drowsiness and entered sleep, sleep EEG demonstrated architecture, there were frontal centrally dominant vertex waves and symmetric sleep spindles noted.  During EEG recording, there was no epileptiform discharge noted.  EKG demonstrate sinus rhythm, with heart rate of 80 bpm CONCLUSION: This is a  normal awake and asleep EEG.  There is no electrodiagnostic evidence of epileptiform discharge.  Marcial Pacas, M.D. Ph.D.  [facility] [place], [place] [address] Phone: [phone] Fax:      [phone]

## 2019-10-06 DIAGNOSIS — Z992 Dependence on renal dialysis: Secondary | ICD-10-CM | POA: Diagnosis not present

## 2019-10-06 DIAGNOSIS — N2581 Secondary hyperparathyroidism of renal origin: Secondary | ICD-10-CM | POA: Diagnosis not present

## 2019-10-06 DIAGNOSIS — D631 Anemia in chronic kidney disease: Secondary | ICD-10-CM | POA: Diagnosis not present

## 2019-10-06 DIAGNOSIS — N186 End stage renal disease: Secondary | ICD-10-CM | POA: Diagnosis not present

## 2019-10-06 DIAGNOSIS — E1121 Type 2 diabetes mellitus with diabetic nephropathy: Secondary | ICD-10-CM | POA: Diagnosis not present

## 2019-10-08 DIAGNOSIS — N2581 Secondary hyperparathyroidism of renal origin: Secondary | ICD-10-CM | POA: Diagnosis not present

## 2019-10-08 DIAGNOSIS — D631 Anemia in chronic kidney disease: Secondary | ICD-10-CM | POA: Diagnosis not present

## 2019-10-08 DIAGNOSIS — N186 End stage renal disease: Secondary | ICD-10-CM | POA: Diagnosis not present

## 2019-10-08 DIAGNOSIS — E1121 Type 2 diabetes mellitus with diabetic nephropathy: Secondary | ICD-10-CM | POA: Diagnosis not present

## 2019-10-08 DIAGNOSIS — Z992 Dependence on renal dialysis: Secondary | ICD-10-CM | POA: Diagnosis not present

## 2019-10-11 DIAGNOSIS — E1121 Type 2 diabetes mellitus with diabetic nephropathy: Secondary | ICD-10-CM | POA: Diagnosis not present

## 2019-10-11 DIAGNOSIS — N2581 Secondary hyperparathyroidism of renal origin: Secondary | ICD-10-CM | POA: Diagnosis not present

## 2019-10-11 DIAGNOSIS — N186 End stage renal disease: Secondary | ICD-10-CM | POA: Diagnosis not present

## 2019-10-11 DIAGNOSIS — Z992 Dependence on renal dialysis: Secondary | ICD-10-CM | POA: Diagnosis not present

## 2019-10-11 DIAGNOSIS — D631 Anemia in chronic kidney disease: Secondary | ICD-10-CM | POA: Diagnosis not present

## 2019-10-12 ENCOUNTER — Encounter: Payer: Self-pay | Admitting: Neurology

## 2019-10-12 ENCOUNTER — Telehealth: Payer: Self-pay | Admitting: *Deleted

## 2019-10-12 ENCOUNTER — Ambulatory Visit: Payer: Medicare Other | Admitting: Neurology

## 2019-10-12 DIAGNOSIS — E1122 Type 2 diabetes mellitus with diabetic chronic kidney disease: Secondary | ICD-10-CM | POA: Diagnosis not present

## 2019-10-12 DIAGNOSIS — N186 End stage renal disease: Secondary | ICD-10-CM | POA: Diagnosis not present

## 2019-10-12 DIAGNOSIS — I132 Hypertensive heart and chronic kidney disease with heart failure and with stage 5 chronic kidney disease, or end stage renal disease: Secondary | ICD-10-CM | POA: Diagnosis not present

## 2019-10-12 DIAGNOSIS — M109 Gout, unspecified: Secondary | ICD-10-CM | POA: Diagnosis not present

## 2019-10-12 DIAGNOSIS — I5032 Chronic diastolic (congestive) heart failure: Secondary | ICD-10-CM | POA: Diagnosis not present

## 2019-10-12 DIAGNOSIS — L89154 Pressure ulcer of sacral region, stage 4: Secondary | ICD-10-CM | POA: Diagnosis not present

## 2019-10-12 NOTE — Telephone Encounter (Signed)
No showed follow up appointment. 

## 2019-10-13 DIAGNOSIS — E1121 Type 2 diabetes mellitus with diabetic nephropathy: Secondary | ICD-10-CM | POA: Diagnosis not present

## 2019-10-13 DIAGNOSIS — N2581 Secondary hyperparathyroidism of renal origin: Secondary | ICD-10-CM | POA: Diagnosis not present

## 2019-10-13 DIAGNOSIS — Z992 Dependence on renal dialysis: Secondary | ICD-10-CM | POA: Diagnosis not present

## 2019-10-13 DIAGNOSIS — D631 Anemia in chronic kidney disease: Secondary | ICD-10-CM | POA: Diagnosis not present

## 2019-10-13 DIAGNOSIS — N186 End stage renal disease: Secondary | ICD-10-CM | POA: Diagnosis not present

## 2019-10-14 DIAGNOSIS — I132 Hypertensive heart and chronic kidney disease with heart failure and with stage 5 chronic kidney disease, or end stage renal disease: Secondary | ICD-10-CM | POA: Diagnosis not present

## 2019-10-14 DIAGNOSIS — M109 Gout, unspecified: Secondary | ICD-10-CM | POA: Diagnosis not present

## 2019-10-14 DIAGNOSIS — E1122 Type 2 diabetes mellitus with diabetic chronic kidney disease: Secondary | ICD-10-CM | POA: Diagnosis not present

## 2019-10-14 DIAGNOSIS — N186 End stage renal disease: Secondary | ICD-10-CM | POA: Diagnosis not present

## 2019-10-14 DIAGNOSIS — I5032 Chronic diastolic (congestive) heart failure: Secondary | ICD-10-CM | POA: Diagnosis not present

## 2019-10-14 DIAGNOSIS — L89154 Pressure ulcer of sacral region, stage 4: Secondary | ICD-10-CM | POA: Diagnosis not present

## 2019-10-15 DIAGNOSIS — Z992 Dependence on renal dialysis: Secondary | ICD-10-CM | POA: Diagnosis not present

## 2019-10-15 DIAGNOSIS — E1121 Type 2 diabetes mellitus with diabetic nephropathy: Secondary | ICD-10-CM | POA: Diagnosis not present

## 2019-10-15 DIAGNOSIS — N2581 Secondary hyperparathyroidism of renal origin: Secondary | ICD-10-CM | POA: Diagnosis not present

## 2019-10-15 DIAGNOSIS — N186 End stage renal disease: Secondary | ICD-10-CM | POA: Diagnosis not present

## 2019-10-15 DIAGNOSIS — D631 Anemia in chronic kidney disease: Secondary | ICD-10-CM | POA: Diagnosis not present

## 2019-10-16 DIAGNOSIS — Z992 Dependence on renal dialysis: Secondary | ICD-10-CM | POA: Diagnosis not present

## 2019-10-16 DIAGNOSIS — E1122 Type 2 diabetes mellitus with diabetic chronic kidney disease: Secondary | ICD-10-CM | POA: Diagnosis not present

## 2019-10-16 DIAGNOSIS — N186 End stage renal disease: Secondary | ICD-10-CM | POA: Diagnosis not present

## 2019-10-18 DIAGNOSIS — N186 End stage renal disease: Secondary | ICD-10-CM | POA: Diagnosis not present

## 2019-10-18 DIAGNOSIS — Z992 Dependence on renal dialysis: Secondary | ICD-10-CM | POA: Diagnosis not present

## 2019-10-18 DIAGNOSIS — N2581 Secondary hyperparathyroidism of renal origin: Secondary | ICD-10-CM | POA: Diagnosis not present

## 2019-10-18 DIAGNOSIS — E8770 Fluid overload, unspecified: Secondary | ICD-10-CM | POA: Diagnosis not present

## 2019-10-18 DIAGNOSIS — D631 Anemia in chronic kidney disease: Secondary | ICD-10-CM | POA: Diagnosis not present

## 2019-10-18 DIAGNOSIS — E1121 Type 2 diabetes mellitus with diabetic nephropathy: Secondary | ICD-10-CM | POA: Diagnosis not present

## 2019-10-19 ENCOUNTER — Telehealth: Payer: Self-pay

## 2019-10-19 DIAGNOSIS — I132 Hypertensive heart and chronic kidney disease with heart failure and with stage 5 chronic kidney disease, or end stage renal disease: Secondary | ICD-10-CM | POA: Diagnosis not present

## 2019-10-19 DIAGNOSIS — N186 End stage renal disease: Secondary | ICD-10-CM | POA: Diagnosis not present

## 2019-10-19 DIAGNOSIS — M109 Gout, unspecified: Secondary | ICD-10-CM | POA: Diagnosis not present

## 2019-10-19 DIAGNOSIS — L89154 Pressure ulcer of sacral region, stage 4: Secondary | ICD-10-CM | POA: Diagnosis not present

## 2019-10-19 DIAGNOSIS — I5032 Chronic diastolic (congestive) heart failure: Secondary | ICD-10-CM | POA: Diagnosis not present

## 2019-10-19 DIAGNOSIS — E1122 Type 2 diabetes mellitus with diabetic chronic kidney disease: Secondary | ICD-10-CM | POA: Diagnosis not present

## 2019-10-19 NOTE — Telephone Encounter (Signed)
Tillie Rung from [facility] calling for PT verbal orders as follows:  2 time(s) weekly for 4 week(s), then 1 time(s) weekly for 2 week(s)  You can leave verbal orders on confidential voicemail at [place], RN

## 2019-10-19 NOTE — Telephone Encounter (Signed)
Verbal order approved. Please call to give order. Thanks.

## 2019-10-19 NOTE — Telephone Encounter (Signed)
Called [facility] and gave verbal orders per Dr. Gwendlyn Deutscher.   Talbot Grumbling, RN

## 2019-10-20 DIAGNOSIS — N186 End stage renal disease: Secondary | ICD-10-CM | POA: Diagnosis not present

## 2019-10-20 DIAGNOSIS — D631 Anemia in chronic kidney disease: Secondary | ICD-10-CM | POA: Diagnosis not present

## 2019-10-20 DIAGNOSIS — E1121 Type 2 diabetes mellitus with diabetic nephropathy: Secondary | ICD-10-CM | POA: Diagnosis not present

## 2019-10-20 DIAGNOSIS — E8770 Fluid overload, unspecified: Secondary | ICD-10-CM | POA: Diagnosis not present

## 2019-10-20 DIAGNOSIS — N2581 Secondary hyperparathyroidism of renal origin: Secondary | ICD-10-CM | POA: Diagnosis not present

## 2019-10-20 DIAGNOSIS — Z992 Dependence on renal dialysis: Secondary | ICD-10-CM | POA: Diagnosis not present

## 2019-10-21 DIAGNOSIS — L89154 Pressure ulcer of sacral region, stage 4: Secondary | ICD-10-CM | POA: Diagnosis not present

## 2019-10-21 DIAGNOSIS — I5032 Chronic diastolic (congestive) heart failure: Secondary | ICD-10-CM | POA: Diagnosis not present

## 2019-10-21 DIAGNOSIS — E1122 Type 2 diabetes mellitus with diabetic chronic kidney disease: Secondary | ICD-10-CM | POA: Diagnosis not present

## 2019-10-21 DIAGNOSIS — I132 Hypertensive heart and chronic kidney disease with heart failure and with stage 5 chronic kidney disease, or end stage renal disease: Secondary | ICD-10-CM | POA: Diagnosis not present

## 2019-10-21 DIAGNOSIS — N186 End stage renal disease: Secondary | ICD-10-CM | POA: Diagnosis not present

## 2019-10-21 DIAGNOSIS — M109 Gout, unspecified: Secondary | ICD-10-CM | POA: Diagnosis not present

## 2019-10-22 DIAGNOSIS — N186 End stage renal disease: Secondary | ICD-10-CM | POA: Diagnosis not present

## 2019-10-22 DIAGNOSIS — E8770 Fluid overload, unspecified: Secondary | ICD-10-CM | POA: Diagnosis not present

## 2019-10-22 DIAGNOSIS — Z992 Dependence on renal dialysis: Secondary | ICD-10-CM | POA: Diagnosis not present

## 2019-10-22 DIAGNOSIS — E1121 Type 2 diabetes mellitus with diabetic nephropathy: Secondary | ICD-10-CM | POA: Diagnosis not present

## 2019-10-22 DIAGNOSIS — N2581 Secondary hyperparathyroidism of renal origin: Secondary | ICD-10-CM | POA: Diagnosis not present

## 2019-10-22 DIAGNOSIS — D631 Anemia in chronic kidney disease: Secondary | ICD-10-CM | POA: Diagnosis not present

## 2019-10-25 DIAGNOSIS — N186 End stage renal disease: Secondary | ICD-10-CM | POA: Diagnosis not present

## 2019-10-25 DIAGNOSIS — N2581 Secondary hyperparathyroidism of renal origin: Secondary | ICD-10-CM | POA: Diagnosis not present

## 2019-10-25 DIAGNOSIS — E8770 Fluid overload, unspecified: Secondary | ICD-10-CM | POA: Diagnosis not present

## 2019-10-25 DIAGNOSIS — Z992 Dependence on renal dialysis: Secondary | ICD-10-CM | POA: Diagnosis not present

## 2019-10-25 DIAGNOSIS — D631 Anemia in chronic kidney disease: Secondary | ICD-10-CM | POA: Diagnosis not present

## 2019-10-25 DIAGNOSIS — E1121 Type 2 diabetes mellitus with diabetic nephropathy: Secondary | ICD-10-CM | POA: Diagnosis not present

## 2019-10-27 DIAGNOSIS — E8770 Fluid overload, unspecified: Secondary | ICD-10-CM | POA: Diagnosis not present

## 2019-10-27 DIAGNOSIS — Z992 Dependence on renal dialysis: Secondary | ICD-10-CM | POA: Diagnosis not present

## 2019-10-27 DIAGNOSIS — D631 Anemia in chronic kidney disease: Secondary | ICD-10-CM | POA: Diagnosis not present

## 2019-10-27 DIAGNOSIS — E1121 Type 2 diabetes mellitus with diabetic nephropathy: Secondary | ICD-10-CM | POA: Diagnosis not present

## 2019-10-27 DIAGNOSIS — N2581 Secondary hyperparathyroidism of renal origin: Secondary | ICD-10-CM | POA: Diagnosis not present

## 2019-10-27 DIAGNOSIS — N186 End stage renal disease: Secondary | ICD-10-CM | POA: Diagnosis not present

## 2019-10-28 ENCOUNTER — Other Ambulatory Visit: Payer: Self-pay

## 2019-10-28 ENCOUNTER — Ambulatory Visit (INDEPENDENT_AMBULATORY_CARE_PROVIDER_SITE_OTHER): Payer: Medicare Other | Admitting: Neurology

## 2019-10-28 ENCOUNTER — Encounter: Payer: Self-pay | Admitting: Neurology

## 2019-10-28 VITALS — BP 128/76 | HR 89 | Temp 97.6°F | Wt 212.0 lb

## 2019-10-28 DIAGNOSIS — G931 Anoxic brain damage, not elsewhere classified: Secondary | ICD-10-CM

## 2019-10-28 DIAGNOSIS — I2510 Atherosclerotic heart disease of native coronary artery without angina pectoris: Secondary | ICD-10-CM | POA: Diagnosis not present

## 2019-10-28 DIAGNOSIS — N186 End stage renal disease: Secondary | ICD-10-CM | POA: Diagnosis not present

## 2019-10-28 DIAGNOSIS — R269 Unspecified abnormalities of gait and mobility: Secondary | ICD-10-CM | POA: Diagnosis not present

## 2019-10-28 DIAGNOSIS — E1122 Type 2 diabetes mellitus with diabetic chronic kidney disease: Secondary | ICD-10-CM | POA: Diagnosis not present

## 2019-10-28 DIAGNOSIS — G253 Myoclonus: Secondary | ICD-10-CM | POA: Diagnosis not present

## 2019-10-28 DIAGNOSIS — I132 Hypertensive heart and chronic kidney disease with heart failure and with stage 5 chronic kidney disease, or end stage renal disease: Secondary | ICD-10-CM | POA: Diagnosis not present

## 2019-10-28 DIAGNOSIS — I5032 Chronic diastolic (congestive) heart failure: Secondary | ICD-10-CM | POA: Diagnosis not present

## 2019-10-28 DIAGNOSIS — M109 Gout, unspecified: Secondary | ICD-10-CM | POA: Diagnosis not present

## 2019-10-28 DIAGNOSIS — L89154 Pressure ulcer of sacral region, stage 4: Secondary | ICD-10-CM | POA: Diagnosis not present

## 2019-10-28 MED ORDER — LEVETIRACETAM 250 MG PO TABS: 500.0000 mg | ORAL_TABLET | Freq: Two times a day (BID) | ORAL | 11 refills | Status: AC

## 2019-10-28 MED ORDER — LEVETIRACETAM 250 MG PO TABS: 250.0000 mg | ORAL_TABLET | Freq: Two times a day (BID) | ORAL | 11 refills | Status: DC

## 2019-10-28 NOTE — Progress Notes (Signed)
PATIENT: Troy Wells DOB: 11-02-1965  Chief Complaint  Patient presents with  . Follow-up    Rm 4 with daughter   . Myoclonic Jerks    Reports sx have improved some but tremors/jerks are still present.      HISTORICAL  Troy Wells is a 54 year old male, seen in request by his primary care physician Dr. Gwendlyn Deutscher, Tawanna Solo for evaluation of myoclonic jerking, is accompanied by his daughter note, at today's visit on August 12, 2019.  I have reviewed and summarized the referring note from the referring physician.  He had past medical history of gout, hyperlipidemia, chronic kidney disease, has been on dialysis since 2016, coronary artery disease, diabetes,   I reviewed extensive hospital admissions, he had a prolonged hospital admission October 28 through 06/16/2019 for Covid pneumonia, ARDS, he drove himself to dialysis on April 14, 2019,, presented with shortness of breath, troponin was 2115, he suffered a heart attack, was tested positive for Covid, later also hypoxemia, bradycardia on November 10, went into PEA arrest, was resuscitated after 30 minutes of CPR, prolonged ICU stay, intubated and sedated until November 2023, noted frequent body twitching, had a neurology consult, continuous EEG, did not show epileptic discharge, but did show profound diffuse encephalopathy, he was started on Keppra,  His acute respiratory distress was believed to be multifactorial, including COVID-19 infection, with superimposed bacterial infection, hypervolemia from ESRD, he was treated with vancomycin, Zosyn, later ceftriaxone,  He continued to have confusion following extubation, Keppra was discontinued on November 25,  Repeat Covid testing was negative on December 7, June 14, 2019  His hospital course was also complicated by left upper extremity, and sacral area ulcer, require wound care,  There was described bilateral upper extremity tremor since his cardiac arrest, hospital  neurologist was consulted, 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  I personally reviewed MRI of the brain without contrast on May 02, 2019, heterogeneous signal changes at bilateral medial thalamus, suspicious for an anoxic brain injury, chronic right cerebellar infarction  Laboratory evaluation July 01, 2019:, Hemoglobin of 10, creatinine of 11.4, mildly decreased albumin 3.0, otherwise normal liver functional test, was 19, it was elevated 50 3 months ago, ferritin 2719, lactate dehydrogenase 195, D-dimer 1.34, negative ANA,  Lactic acid was 8.7 April 27, 2019,  Echocardiogram January 2021, ejection fraction was 30%, moderate to severely decreased left ventricular function, mildly increased left ventricular wall thickness, severely dilated left ventricular internal cavity size, left ventricle regional wall motion abnormality, inferolateral akinesis, severe inferior hypokinesis, severe anterolateral hypokinesia  Prior to his hospital admission, he works as a Administrator, drove himself to dialysis the day of his hospital admission, then he was discharged to rehab for extended period of time, just came back home around July 27, 2018, continue to show slow improvement, but continued bothered by a large amplitude upper extremity, and the lower extremity tremor, difficulty walking, rely on his walker, difficulty feeding, sometimes food jumping out of his utensil  UPDATE Oct 28 2019: Patient is accompanied by his daughter at today's clinical visit, overall he is doing better, Keppra 250 mg has helped him, he can ambulate with a walker better, but with prolonged walking, such as from parking lot to the office, he would develop bilateral lower extremity large amplitude tremor  He denies significant low back pain, mild bilateral toe numbness, no bowel and bladder incontinence, no hands paresthesia, no  upper extremity weakness.  EEG was normal on Oct 28 2019.  Laboratory evaluation 08/14/2019 showed hemoglobin of 10, RDW of 16, creatinine of 14, potassium of 6.0, sodium 134,  REVIEW OF SYSTEMS: Full 14 system review of systems performed and notable only for as above All other review of systems were negative.  ALLERGIES: No Known Allergies  HOME MEDICATIONS: Current Outpatient Medications  Medication Sig Dispense Refill  . acetaminophen (TYLENOL) 325 MG tablet Take 650 mg by mouth every 6 (six) hours as needed for mild pain.    Marland Kitchen albuterol (VENTOLIN HFA) 108 (90 Base) MCG/ACT inhaler Inhale 1-2 puffs into the lungs every 6 (six) hours as needed for wheezing or shortness of breath. 6.7 g 0  . allopurinol (ZYLOPRIM) 100 MG tablet Take 1 tablet (100 mg total) by mouth 2 (two) times daily. 60 tablet 4  . Amino Acids-Protein Hydrolys (FEEDING SUPPLEMENT, PRO-STAT SUGAR FREE 64,) LIQD Take 30 mLs by mouth 2 (two) times daily. 1688 mL 6  . atorvastatin (LIPITOR) 80 MG tablet Take 1 tablet (80 mg total) by mouth daily. 90 tablet 1  . b complex-vitamin c-folic acid (NEPHRO-VITE) 0.8 MG TABS tablet Take 1 tablet by mouth daily. 30 tablet 0  . bisacodyl (DULCOLAX) 10 MG suppository Place 10 mg rectally daily as needed for moderate constipation (If not relieved by MOM).    . carvedilol (COREG) 6.25 MG tablet Take 1 tablet (6.25 mg total) by mouth 2 (two) times daily with a meal. 60 tablet 4  . cetirizine (ZYRTEC) 5 MG tablet Take 1 tablet (5 mg total) by mouth daily. 90 tablet 1  . levETIRAcetam (KEPPRA) 250 MG tablet Take 1 tablet (250 mg total) by mouth 2 (two) times daily. 60 tablet 4  . lidocaine (LIDODERM) 5 % Place 1 patch onto the skin daily. Remove & Discard patch within 12 hours or as directed by MD 30 patch 0  . mirtazapine (REMERON) 15 MG tablet Take 1 tablet (15 mg total) by mouth at bedtime. 90 tablet 1  . sevelamer carbonate (RENVELA) 800 MG tablet Take 1 tablet (800 mg total) by mouth 3  (three) times daily with meals. 90 tablet 0  . Sodium Phosphates (RA SALINE ENEMA RE) Place 1 each rectally daily as needed (If no BM after Bisacodyl).     No current facility-administered medications for this visit.    PAST MEDICAL HISTORY: Past Medical History:  Diagnosis Date  . CARDIAC ARREST 11/01/2009   Qualifier: Diagnosis of  By: Selena Batten CMA, [place]    . Colon polyps 01/02/2016   Colonoscopy July 2017 - One 3 mm polyp in the transverse colon, removed with a cold biopsy forceps. Resected and retrieved. - One 3 mm polyp in the rectum, removed with a cold biopsy forceps. Resected and retrieved. - Diverticulosis in the entire examined colon. - Non-bleeding internal hemorrhoids. - The examination was otherwise normal. - Significant looping which prolonged cecal    . Coronary artery disease 08/15/2009   with AMI  . Coronary atherosclerosis 11/01/2009   Qualifier: Diagnosis of  By: Selena Batten CMA, Jewel    . Dialysis patient ([place]) 09/27/2014   mon, wed, and fri  . Diverticulosis of colon without hemorrhage 01/02/2016   Colonoscopy July 2017 - One 3 mm polyp in the transverse colon, removed with a cold biopsy forceps. Resected and retrieved. - One 3 mm polyp in the rectum, removed with a cold biopsy forceps. Resected and retrieved. - Diverticulosis in the entire examined colon. - Non-bleeding internal  hemorrhoids. - The examination was otherwise normal. - Significant looping which prolonged cecal    . DM (diabetes mellitus), type 2 with renal complications ([facility]) 31/51/7616  . Essential hypertension 11/01/2009   Qualifier: Diagnosis of  By: Selena Batten CMA, Jewel    . Gout 10/21/2018  . History of acute respiratory distress syndrome (ARDS) due to COVID-19 virus   . History of cardiac arrest 04/27/2019  . History of non-ST elevation myocardial infarction (NSTEMI) 04/18/2019  . history of respiratory arrest   . Hyperlipidemia   . Hypertension   . Mitral valve regurgitation 09/19/2015   Echo  09/2015   . 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 OSA treated with BiPAP 06/17/2010  . Sleep apnea    cpap nightly    PAST SURGICAL HISTORY: Past Surgical History:  Procedure Laterality Date  . AV FISTULA PLACEMENT Left 09/27/2014  . INSERTION OF ARTERIOVENOUS (AV) ARTEGRAFT ARM Left 04/02/2018   Procedure: INSERTION OF ARTERIOVENOUS (AV) ARTEGRAFT ARM LEFT UPPER ARM;  Surgeon: Waynetta Sandy, MD;  Location: [facility];  Service: Vascular;  Laterality: Left;  . INSERTION OF DIALYSIS CATHETER N/A 04/02/2018   Procedure: INSERTION OF DIALYSIS CATHETER, right internal jugular;  Surgeon: Waynetta Sandy, MD;  Location: [facility];  Service: Vascular;  Laterality: N/A;  . NECK SURGERY     C6 & C7 replaced 30 yrs ago per pt  . REVISON OF ARTERIOVENOUS FISTULA Left 04/02/2018   Procedure: REVISION PLICATION OF ARTERIOVENOUS FISTULA ARM;  Surgeon: Waynetta Sandy, MD;  Location: [facility];  Service: Vascular;  Laterality: Left;  . WISDOM TOOTH EXTRACTION      FAMILY HISTORY: Family History  Problem Relation Age of Onset  . Hypertension Mother   . Heart disease Mother 55       cause of death  . Hypertension Father   . Aneurysm Father 42       brain aneurysm  . Heart disease Father 63       cause of death  . Hypertension Brother   . Alcohol abuse Brother   . Diabetes Brother   . Pancreatitis Brother   . Hypertension Brother   . Colon cancer Neg Hx     SOCIAL HISTORY: Social History   Socioeconomic History  . Marital status: Single    Spouse name: Not on file  . Number of children: 1  . Years of education: college  . Highest education level: Not on file  Occupational History  . Not on file  Tobacco Use  . Smoking status: Former Smoker    Packs/day: 1.00    Years: 10.00    Pack years: 10.00    Types: Cigars    Quit date: 06/18/2011    Years since quitting: 8.3  . Smokeless tobacco: Never Used  Substance and Sexual Activity  . Alcohol use: Yes    Comment: beer  or liquor occasionally  . Drug use: No  . Sexual activity: Yes  Other Topics Concern  . Not on file  Social History Narrative   Lives at home with his aunt. His daughter also helps him.   Right-handed.   No daily caffeine use.   Social Determinants of Health   Financial Resource Strain:   . Difficulty of Paying Living Expenses:   Food Insecurity:   . Worried About Charity fundraiser in the Last Year:   . Arboriculturist in the Last Year:   Transportation Needs:   . Film/video editor (Medical):   Marland Kitchen  Lack of Transportation (Non-Medical):   Physical Activity:   . Days of Exercise per Week:   . Minutes of Exercise per Session:   Stress:   . Feeling of Stress :   Social Connections:   . Frequency of Communication with Friends and Family:   . Frequency of Social Gatherings with Friends and Family:   . Attends Religious Services:   . Active Member of Clubs or Organizations:   . Attends Archivist Meetings:   Marland Kitchen Marital Status:   Intimate Partner Violence:   . Fear of Current or Ex-Partner:   . Emotionally Abused:   Marland Kitchen Physically Abused:   . Sexually Abused:      PHYSICAL EXAM   Vitals:   10/28/19 1322  BP: 128/76  Pulse: 89  Temp: 97.6 F (36.4 C)  TempSrc: Temporal  Weight: 212 lb (96.2 kg)    Not recorded      Body mass index is 29.16 kg/m.  PHYSICAL EXAMNIATION:  Gen: NAD, conversant, well nourised, well groomed                     Cardiovascular: Regular rate rhythm, no peripheral edema, warm, nontender. Eyes: Conjunctivae clear without exudates or hemorrhage Neck: Supple, no carotid bruits. Pulmonary: Clear to auscultation bilaterally   NEUROLOGICAL EXAM:  MENTAL STATUS: Speech:    Speech is normal; fluent and spontaneous with normal comprehension.  Cognition:     Orientation to time, place and person     Normal recent and remote memory     Normal Attention span and concentration     Normal Language, naming, repeating,spontaneous  speech     Fund of knowledge   CRANIAL NERVES: CN II: Visual fields are full to confrontation. Pupils are round equal and briskly reactive to light. CN III, IV, VI: extraocular movement are normal. No ptosis. CN V: Facial sensation is intact to light touch CN VII: Face is symmetric with normal eye closure  CN VIII: Hearing is normal to causal conversation. CN IX, X: Phonation is normal. CN XI: Head turning and shoulder shrug are intact  MOTOR: Normal muscle tone, and stress, large amplitude bilateral lower extremity  action tremor, noticeable when raising both lower extremity against gravity,   REFLEXES: Reflexes are 1 and symmetric at the biceps, triceps, knees, and ankles. Plantar responses are extensor bilaterally  SENSORY: Intact to light touch, pinprick and vibratory sensation are intact in fingers and toes.  COORDINATION: There is no trunk or limb dysmetria noted.  GAIT/STANCE: He needs to rely on walker to get up from seated position, right based, cautious, unsteady gait  DIAGNOSTIC DATA (LABS, IMAGING, TESTING) - I reviewed patient records, labs, notes, testing and imaging myself where available.   ASSESSMENT AND PLAN  Troy Wells is a 54 y.o. male   Status post hypoxic injury due to heart attack, COVID-19 pneumonia, ARDS, required prolonged CPR and intubation in November 2020  Large amplitude lower extremity tremor most consistent with myoclonic jerking movement,  Upper extremity myoclonic jerking has much improved, reported mild to moderate improvement with Keppra 250 mg twice a day, tolerating it well,  EEG was normal,  His myoclonic jerking most consistent with his anoxic brain injury, end-stage kidney disease, metabolic toxic disarrangement,  May try higher dose of Keppra 250 milligrams up to 2 tablets twice a day   Continue rehabilitation   Troy Wells, M.D. Ph.D.  [facility] [address] [place], [place] [address] Ph: [phone])  [phone] Fax: [phone]  CC: [name], MD

## 2019-10-29 DIAGNOSIS — E8770 Fluid overload, unspecified: Secondary | ICD-10-CM | POA: Diagnosis not present

## 2019-10-29 DIAGNOSIS — N186 End stage renal disease: Secondary | ICD-10-CM | POA: Diagnosis not present

## 2019-10-29 DIAGNOSIS — D631 Anemia in chronic kidney disease: Secondary | ICD-10-CM | POA: Diagnosis not present

## 2019-10-29 DIAGNOSIS — E1121 Type 2 diabetes mellitus with diabetic nephropathy: Secondary | ICD-10-CM | POA: Diagnosis not present

## 2019-10-29 DIAGNOSIS — N2581 Secondary hyperparathyroidism of renal origin: Secondary | ICD-10-CM | POA: Diagnosis not present

## 2019-10-29 DIAGNOSIS — Z992 Dependence on renal dialysis: Secondary | ICD-10-CM | POA: Diagnosis not present

## 2019-11-01 DIAGNOSIS — E8770 Fluid overload, unspecified: Secondary | ICD-10-CM | POA: Diagnosis not present

## 2019-11-01 DIAGNOSIS — N186 End stage renal disease: Secondary | ICD-10-CM | POA: Diagnosis not present

## 2019-11-01 DIAGNOSIS — E1121 Type 2 diabetes mellitus with diabetic nephropathy: Secondary | ICD-10-CM | POA: Diagnosis not present

## 2019-11-01 DIAGNOSIS — N2581 Secondary hyperparathyroidism of renal origin: Secondary | ICD-10-CM | POA: Diagnosis not present

## 2019-11-01 DIAGNOSIS — D631 Anemia in chronic kidney disease: Secondary | ICD-10-CM | POA: Diagnosis not present

## 2019-11-01 DIAGNOSIS — Z992 Dependence on renal dialysis: Secondary | ICD-10-CM | POA: Diagnosis not present

## 2019-11-02 DIAGNOSIS — N2581 Secondary hyperparathyroidism of renal origin: Secondary | ICD-10-CM | POA: Diagnosis not present

## 2019-11-02 DIAGNOSIS — E8770 Fluid overload, unspecified: Secondary | ICD-10-CM | POA: Diagnosis not present

## 2019-11-02 DIAGNOSIS — E1121 Type 2 diabetes mellitus with diabetic nephropathy: Secondary | ICD-10-CM | POA: Diagnosis not present

## 2019-11-02 DIAGNOSIS — Z992 Dependence on renal dialysis: Secondary | ICD-10-CM | POA: Diagnosis not present

## 2019-11-02 DIAGNOSIS — D631 Anemia in chronic kidney disease: Secondary | ICD-10-CM | POA: Diagnosis not present

## 2019-11-02 DIAGNOSIS — N186 End stage renal disease: Secondary | ICD-10-CM | POA: Diagnosis not present

## 2019-11-03 DIAGNOSIS — D631 Anemia in chronic kidney disease: Secondary | ICD-10-CM | POA: Diagnosis not present

## 2019-11-03 DIAGNOSIS — E8770 Fluid overload, unspecified: Secondary | ICD-10-CM | POA: Diagnosis not present

## 2019-11-03 DIAGNOSIS — N2581 Secondary hyperparathyroidism of renal origin: Secondary | ICD-10-CM | POA: Diagnosis not present

## 2019-11-03 DIAGNOSIS — L89154 Pressure ulcer of sacral region, stage 4: Secondary | ICD-10-CM | POA: Diagnosis not present

## 2019-11-03 DIAGNOSIS — I5032 Chronic diastolic (congestive) heart failure: Secondary | ICD-10-CM | POA: Diagnosis not present

## 2019-11-03 DIAGNOSIS — E1122 Type 2 diabetes mellitus with diabetic chronic kidney disease: Secondary | ICD-10-CM | POA: Diagnosis not present

## 2019-11-03 DIAGNOSIS — I132 Hypertensive heart and chronic kidney disease with heart failure and with stage 5 chronic kidney disease, or end stage renal disease: Secondary | ICD-10-CM | POA: Diagnosis not present

## 2019-11-03 DIAGNOSIS — N186 End stage renal disease: Secondary | ICD-10-CM | POA: Diagnosis not present

## 2019-11-03 DIAGNOSIS — M109 Gout, unspecified: Secondary | ICD-10-CM | POA: Diagnosis not present

## 2019-11-03 DIAGNOSIS — E1121 Type 2 diabetes mellitus with diabetic nephropathy: Secondary | ICD-10-CM | POA: Diagnosis not present

## 2019-11-03 DIAGNOSIS — Z992 Dependence on renal dialysis: Secondary | ICD-10-CM | POA: Diagnosis not present

## 2019-11-04 DIAGNOSIS — Z8674 Personal history of sudden cardiac arrest: Secondary | ICD-10-CM | POA: Diagnosis not present

## 2019-11-04 DIAGNOSIS — N186 End stage renal disease: Secondary | ICD-10-CM | POA: Diagnosis not present

## 2019-11-04 DIAGNOSIS — I340 Nonrheumatic mitral (valve) insufficiency: Secondary | ICD-10-CM | POA: Diagnosis not present

## 2019-11-04 DIAGNOSIS — Z86010 Personal history of colonic polyps: Secondary | ICD-10-CM | POA: Diagnosis not present

## 2019-11-04 DIAGNOSIS — I252 Old myocardial infarction: Secondary | ICD-10-CM | POA: Diagnosis not present

## 2019-11-04 DIAGNOSIS — I132 Hypertensive heart and chronic kidney disease with heart failure and with stage 5 chronic kidney disease, or end stage renal disease: Secondary | ICD-10-CM | POA: Diagnosis not present

## 2019-11-04 DIAGNOSIS — R41841 Cognitive communication deficit: Secondary | ICD-10-CM | POA: Diagnosis not present

## 2019-11-04 DIAGNOSIS — E1140 Type 2 diabetes mellitus with diabetic neuropathy, unspecified: Secondary | ICD-10-CM | POA: Diagnosis not present

## 2019-11-04 DIAGNOSIS — E1159 Type 2 diabetes mellitus with other circulatory complications: Secondary | ICD-10-CM | POA: Diagnosis not present

## 2019-11-04 DIAGNOSIS — I272 Pulmonary hypertension, unspecified: Secondary | ICD-10-CM | POA: Diagnosis not present

## 2019-11-04 DIAGNOSIS — G253 Myoclonus: Secondary | ICD-10-CM | POA: Diagnosis not present

## 2019-11-04 DIAGNOSIS — G931 Anoxic brain damage, not elsewhere classified: Secondary | ICD-10-CM | POA: Diagnosis not present

## 2019-11-04 DIAGNOSIS — Z9181 History of falling: Secondary | ICD-10-CM | POA: Diagnosis not present

## 2019-11-04 DIAGNOSIS — Z992 Dependence on renal dialysis: Secondary | ICD-10-CM | POA: Diagnosis not present

## 2019-11-04 DIAGNOSIS — E782 Mixed hyperlipidemia: Secondary | ICD-10-CM | POA: Diagnosis not present

## 2019-11-04 DIAGNOSIS — E46 Unspecified protein-calorie malnutrition: Secondary | ICD-10-CM | POA: Diagnosis not present

## 2019-11-04 DIAGNOSIS — M109 Gout, unspecified: Secondary | ICD-10-CM | POA: Diagnosis not present

## 2019-11-04 DIAGNOSIS — Z8616 Personal history of COVID-19: Secondary | ICD-10-CM | POA: Diagnosis not present

## 2019-11-04 DIAGNOSIS — Z8701 Personal history of pneumonia (recurrent): Secondary | ICD-10-CM | POA: Diagnosis not present

## 2019-11-04 DIAGNOSIS — L89154 Pressure ulcer of sacral region, stage 4: Secondary | ICD-10-CM | POA: Diagnosis not present

## 2019-11-04 DIAGNOSIS — I5032 Chronic diastolic (congestive) heart failure: Secondary | ICD-10-CM | POA: Diagnosis not present

## 2019-11-04 DIAGNOSIS — G4733 Obstructive sleep apnea (adult) (pediatric): Secondary | ICD-10-CM | POA: Diagnosis not present

## 2019-11-04 DIAGNOSIS — I2510 Atherosclerotic heart disease of native coronary artery without angina pectoris: Secondary | ICD-10-CM | POA: Diagnosis not present

## 2019-11-04 DIAGNOSIS — K5730 Diverticulosis of large intestine without perforation or abscess without bleeding: Secondary | ICD-10-CM | POA: Diagnosis not present

## 2019-11-04 DIAGNOSIS — E1122 Type 2 diabetes mellitus with diabetic chronic kidney disease: Secondary | ICD-10-CM | POA: Diagnosis not present

## 2019-11-05 DIAGNOSIS — Z992 Dependence on renal dialysis: Secondary | ICD-10-CM | POA: Diagnosis not present

## 2019-11-05 DIAGNOSIS — E8770 Fluid overload, unspecified: Secondary | ICD-10-CM | POA: Diagnosis not present

## 2019-11-05 DIAGNOSIS — N186 End stage renal disease: Secondary | ICD-10-CM | POA: Diagnosis not present

## 2019-11-05 DIAGNOSIS — N2581 Secondary hyperparathyroidism of renal origin: Secondary | ICD-10-CM | POA: Diagnosis not present

## 2019-11-05 DIAGNOSIS — E1121 Type 2 diabetes mellitus with diabetic nephropathy: Secondary | ICD-10-CM | POA: Diagnosis not present

## 2019-11-05 DIAGNOSIS — D631 Anemia in chronic kidney disease: Secondary | ICD-10-CM | POA: Diagnosis not present

## 2019-11-08 DIAGNOSIS — E1121 Type 2 diabetes mellitus with diabetic nephropathy: Secondary | ICD-10-CM | POA: Diagnosis not present

## 2019-11-08 DIAGNOSIS — N2581 Secondary hyperparathyroidism of renal origin: Secondary | ICD-10-CM | POA: Diagnosis not present

## 2019-11-08 DIAGNOSIS — N186 End stage renal disease: Secondary | ICD-10-CM | POA: Diagnosis not present

## 2019-11-08 DIAGNOSIS — D631 Anemia in chronic kidney disease: Secondary | ICD-10-CM | POA: Diagnosis not present

## 2019-11-08 DIAGNOSIS — Z992 Dependence on renal dialysis: Secondary | ICD-10-CM | POA: Diagnosis not present

## 2019-11-08 DIAGNOSIS — E8770 Fluid overload, unspecified: Secondary | ICD-10-CM | POA: Diagnosis not present

## 2019-11-10 DIAGNOSIS — N2581 Secondary hyperparathyroidism of renal origin: Secondary | ICD-10-CM | POA: Diagnosis not present

## 2019-11-10 DIAGNOSIS — N186 End stage renal disease: Secondary | ICD-10-CM | POA: Diagnosis not present

## 2019-11-10 DIAGNOSIS — E8770 Fluid overload, unspecified: Secondary | ICD-10-CM | POA: Diagnosis not present

## 2019-11-10 DIAGNOSIS — E1121 Type 2 diabetes mellitus with diabetic nephropathy: Secondary | ICD-10-CM | POA: Diagnosis not present

## 2019-11-10 DIAGNOSIS — Z992 Dependence on renal dialysis: Secondary | ICD-10-CM | POA: Diagnosis not present

## 2019-11-10 DIAGNOSIS — D631 Anemia in chronic kidney disease: Secondary | ICD-10-CM | POA: Diagnosis not present

## 2019-11-11 DIAGNOSIS — N186 End stage renal disease: Secondary | ICD-10-CM | POA: Diagnosis not present

## 2019-11-11 DIAGNOSIS — E1122 Type 2 diabetes mellitus with diabetic chronic kidney disease: Secondary | ICD-10-CM | POA: Diagnosis not present

## 2019-11-11 DIAGNOSIS — M109 Gout, unspecified: Secondary | ICD-10-CM | POA: Diagnosis not present

## 2019-11-11 DIAGNOSIS — L89154 Pressure ulcer of sacral region, stage 4: Secondary | ICD-10-CM | POA: Diagnosis not present

## 2019-11-11 DIAGNOSIS — I132 Hypertensive heart and chronic kidney disease with heart failure and with stage 5 chronic kidney disease, or end stage renal disease: Secondary | ICD-10-CM | POA: Diagnosis not present

## 2019-11-11 DIAGNOSIS — I5032 Chronic diastolic (congestive) heart failure: Secondary | ICD-10-CM | POA: Diagnosis not present

## 2019-11-12 DIAGNOSIS — Z992 Dependence on renal dialysis: Secondary | ICD-10-CM | POA: Diagnosis not present

## 2019-11-12 DIAGNOSIS — D631 Anemia in chronic kidney disease: Secondary | ICD-10-CM | POA: Diagnosis not present

## 2019-11-12 DIAGNOSIS — N2581 Secondary hyperparathyroidism of renal origin: Secondary | ICD-10-CM | POA: Diagnosis not present

## 2019-11-12 DIAGNOSIS — N186 End stage renal disease: Secondary | ICD-10-CM | POA: Diagnosis not present

## 2019-11-12 DIAGNOSIS — E8770 Fluid overload, unspecified: Secondary | ICD-10-CM | POA: Diagnosis not present

## 2019-11-12 DIAGNOSIS — E1121 Type 2 diabetes mellitus with diabetic nephropathy: Secondary | ICD-10-CM | POA: Diagnosis not present

## 2019-11-15 DIAGNOSIS — N186 End stage renal disease: Secondary | ICD-10-CM | POA: Diagnosis not present

## 2019-11-15 DIAGNOSIS — E1121 Type 2 diabetes mellitus with diabetic nephropathy: Secondary | ICD-10-CM | POA: Diagnosis not present

## 2019-11-15 DIAGNOSIS — E8770 Fluid overload, unspecified: Secondary | ICD-10-CM | POA: Diagnosis not present

## 2019-11-15 DIAGNOSIS — D631 Anemia in chronic kidney disease: Secondary | ICD-10-CM | POA: Diagnosis not present

## 2019-11-15 DIAGNOSIS — N2581 Secondary hyperparathyroidism of renal origin: Secondary | ICD-10-CM | POA: Diagnosis not present

## 2019-11-15 DIAGNOSIS — Z992 Dependence on renal dialysis: Secondary | ICD-10-CM | POA: Diagnosis not present

## 2019-11-23 DIAGNOSIS — L89154 Pressure ulcer of sacral region, stage 4: Secondary | ICD-10-CM | POA: Diagnosis not present

## 2019-11-23 DIAGNOSIS — I5032 Chronic diastolic (congestive) heart failure: Secondary | ICD-10-CM | POA: Diagnosis not present

## 2019-11-23 DIAGNOSIS — I132 Hypertensive heart and chronic kidney disease with heart failure and with stage 5 chronic kidney disease, or end stage renal disease: Secondary | ICD-10-CM | POA: Diagnosis not present

## 2019-11-23 DIAGNOSIS — E1122 Type 2 diabetes mellitus with diabetic chronic kidney disease: Secondary | ICD-10-CM | POA: Diagnosis not present

## 2019-11-23 DIAGNOSIS — M109 Gout, unspecified: Secondary | ICD-10-CM | POA: Diagnosis not present

## 2019-11-23 DIAGNOSIS — N186 End stage renal disease: Secondary | ICD-10-CM | POA: Diagnosis not present

## 2019-11-30 DIAGNOSIS — I132 Hypertensive heart and chronic kidney disease with heart failure and with stage 5 chronic kidney disease, or end stage renal disease: Secondary | ICD-10-CM | POA: Diagnosis not present

## 2019-11-30 DIAGNOSIS — N186 End stage renal disease: Secondary | ICD-10-CM | POA: Diagnosis not present

## 2019-11-30 DIAGNOSIS — L89154 Pressure ulcer of sacral region, stage 4: Secondary | ICD-10-CM | POA: Diagnosis not present

## 2019-11-30 DIAGNOSIS — I5032 Chronic diastolic (congestive) heart failure: Secondary | ICD-10-CM | POA: Diagnosis not present

## 2019-11-30 DIAGNOSIS — E1122 Type 2 diabetes mellitus with diabetic chronic kidney disease: Secondary | ICD-10-CM | POA: Diagnosis not present

## 2019-11-30 DIAGNOSIS — M109 Gout, unspecified: Secondary | ICD-10-CM | POA: Diagnosis not present

## 2019-12-16 DIAGNOSIS — E1122 Type 2 diabetes mellitus with diabetic chronic kidney disease: Secondary | ICD-10-CM | POA: Diagnosis not present

## 2019-12-16 DIAGNOSIS — Z992 Dependence on renal dialysis: Secondary | ICD-10-CM | POA: Diagnosis not present

## 2019-12-16 DIAGNOSIS — N186 End stage renal disease: Secondary | ICD-10-CM | POA: Diagnosis not present

## 2019-12-17 DIAGNOSIS — Z992 Dependence on renal dialysis: Secondary | ICD-10-CM | POA: Diagnosis not present

## 2019-12-17 DIAGNOSIS — N186 End stage renal disease: Secondary | ICD-10-CM | POA: Diagnosis not present

## 2019-12-17 DIAGNOSIS — D631 Anemia in chronic kidney disease: Secondary | ICD-10-CM | POA: Diagnosis not present

## 2019-12-17 DIAGNOSIS — N2581 Secondary hyperparathyroidism of renal origin: Secondary | ICD-10-CM | POA: Diagnosis not present

## 2019-12-17 DIAGNOSIS — E1121 Type 2 diabetes mellitus with diabetic nephropathy: Secondary | ICD-10-CM | POA: Diagnosis not present

## 2019-12-20 DIAGNOSIS — Z992 Dependence on renal dialysis: Secondary | ICD-10-CM | POA: Diagnosis not present

## 2019-12-20 DIAGNOSIS — E1121 Type 2 diabetes mellitus with diabetic nephropathy: Secondary | ICD-10-CM | POA: Diagnosis not present

## 2019-12-20 DIAGNOSIS — N2581 Secondary hyperparathyroidism of renal origin: Secondary | ICD-10-CM | POA: Diagnosis not present

## 2019-12-20 DIAGNOSIS — N186 End stage renal disease: Secondary | ICD-10-CM | POA: Diagnosis not present

## 2019-12-20 DIAGNOSIS — D631 Anemia in chronic kidney disease: Secondary | ICD-10-CM | POA: Diagnosis not present

## 2019-12-21 ENCOUNTER — Other Ambulatory Visit: Payer: Self-pay | Admitting: Family Medicine

## 2019-12-21 ENCOUNTER — Telehealth: Payer: Self-pay | Admitting: Family Medicine

## 2019-12-21 DIAGNOSIS — G253 Myoclonus: Secondary | ICD-10-CM

## 2019-12-21 NOTE — Telephone Encounter (Signed)
Daughter is aware that PT will reach out to her to schedule patient's appointments.  Rahsaan Weakland,CMA

## 2019-12-21 NOTE — Telephone Encounter (Signed)
Daughter is calling to request outpatient PT for her father. She would like the referral to go to  [facility] on [address].

## 2019-12-21 NOTE — Telephone Encounter (Signed)
Per neurology note from May 2021, patient was advised to continued rehab but there is no current referral for this.  Will ask that pcp place this referral for patient.  Albertine Lafoy,CMA

## 2019-12-21 NOTE — Progress Notes (Signed)
Troy Wells, CMA  You 4 hours ago (10:14 AM)     Per neurology note from May 2021, patient was advised to continued rehab but there is no current referral for this.  Will ask that pcp place this referral for patient.  [place]       Documentation   Penelope Galas L routed conversation to [employer] 4 hours ago (10:11 AM)  Penelope Galas L 4 hours ago (10:10 AM)  JW   Daughter is calling to request outpatient PT for her father. She would like the referral to go to  [facility] on [address].       Documentation

## 2019-12-21 NOTE — Telephone Encounter (Signed)
Referral placed.

## 2019-12-22 DIAGNOSIS — N2581 Secondary hyperparathyroidism of renal origin: Secondary | ICD-10-CM | POA: Diagnosis not present

## 2019-12-22 DIAGNOSIS — E1121 Type 2 diabetes mellitus with diabetic nephropathy: Secondary | ICD-10-CM | POA: Diagnosis not present

## 2019-12-22 DIAGNOSIS — D631 Anemia in chronic kidney disease: Secondary | ICD-10-CM | POA: Diagnosis not present

## 2019-12-22 DIAGNOSIS — N186 End stage renal disease: Secondary | ICD-10-CM | POA: Diagnosis not present

## 2019-12-22 DIAGNOSIS — Z992 Dependence on renal dialysis: Secondary | ICD-10-CM | POA: Diagnosis not present

## 2019-12-24 DIAGNOSIS — N186 End stage renal disease: Secondary | ICD-10-CM | POA: Diagnosis not present

## 2019-12-24 DIAGNOSIS — N2581 Secondary hyperparathyroidism of renal origin: Secondary | ICD-10-CM | POA: Diagnosis not present

## 2019-12-24 DIAGNOSIS — E1121 Type 2 diabetes mellitus with diabetic nephropathy: Secondary | ICD-10-CM | POA: Diagnosis not present

## 2019-12-24 DIAGNOSIS — D631 Anemia in chronic kidney disease: Secondary | ICD-10-CM | POA: Diagnosis not present

## 2019-12-24 DIAGNOSIS — Z992 Dependence on renal dialysis: Secondary | ICD-10-CM | POA: Diagnosis not present

## 2019-12-27 DIAGNOSIS — Z992 Dependence on renal dialysis: Secondary | ICD-10-CM | POA: Diagnosis not present

## 2019-12-27 DIAGNOSIS — D631 Anemia in chronic kidney disease: Secondary | ICD-10-CM | POA: Diagnosis not present

## 2019-12-27 DIAGNOSIS — N2581 Secondary hyperparathyroidism of renal origin: Secondary | ICD-10-CM | POA: Diagnosis not present

## 2019-12-27 DIAGNOSIS — E1121 Type 2 diabetes mellitus with diabetic nephropathy: Secondary | ICD-10-CM | POA: Diagnosis not present

## 2019-12-27 DIAGNOSIS — N186 End stage renal disease: Secondary | ICD-10-CM | POA: Diagnosis not present

## 2019-12-29 DIAGNOSIS — E1121 Type 2 diabetes mellitus with diabetic nephropathy: Secondary | ICD-10-CM | POA: Diagnosis not present

## 2019-12-29 DIAGNOSIS — N186 End stage renal disease: Secondary | ICD-10-CM | POA: Diagnosis not present

## 2019-12-29 DIAGNOSIS — Z992 Dependence on renal dialysis: Secondary | ICD-10-CM | POA: Diagnosis not present

## 2019-12-29 DIAGNOSIS — N2581 Secondary hyperparathyroidism of renal origin: Secondary | ICD-10-CM | POA: Diagnosis not present

## 2019-12-29 DIAGNOSIS — D631 Anemia in chronic kidney disease: Secondary | ICD-10-CM | POA: Diagnosis not present

## 2019-12-31 DIAGNOSIS — N2581 Secondary hyperparathyroidism of renal origin: Secondary | ICD-10-CM | POA: Diagnosis not present

## 2019-12-31 DIAGNOSIS — Z992 Dependence on renal dialysis: Secondary | ICD-10-CM | POA: Diagnosis not present

## 2019-12-31 DIAGNOSIS — D631 Anemia in chronic kidney disease: Secondary | ICD-10-CM | POA: Diagnosis not present

## 2019-12-31 DIAGNOSIS — E1121 Type 2 diabetes mellitus with diabetic nephropathy: Secondary | ICD-10-CM | POA: Diagnosis not present

## 2019-12-31 DIAGNOSIS — N186 End stage renal disease: Secondary | ICD-10-CM | POA: Diagnosis not present

## 2020-01-03 DIAGNOSIS — N186 End stage renal disease: Secondary | ICD-10-CM | POA: Diagnosis not present

## 2020-01-03 DIAGNOSIS — E1121 Type 2 diabetes mellitus with diabetic nephropathy: Secondary | ICD-10-CM | POA: Diagnosis not present

## 2020-01-03 DIAGNOSIS — Z992 Dependence on renal dialysis: Secondary | ICD-10-CM | POA: Diagnosis not present

## 2020-01-03 DIAGNOSIS — D631 Anemia in chronic kidney disease: Secondary | ICD-10-CM | POA: Diagnosis not present

## 2020-01-03 DIAGNOSIS — N2581 Secondary hyperparathyroidism of renal origin: Secondary | ICD-10-CM | POA: Diagnosis not present

## 2020-01-04 ENCOUNTER — Other Ambulatory Visit: Payer: Self-pay

## 2020-01-04 ENCOUNTER — Ambulatory Visit: Payer: Medicare Other | Attending: Family Medicine

## 2020-01-04 DIAGNOSIS — R2681 Unsteadiness on feet: Secondary | ICD-10-CM | POA: Diagnosis not present

## 2020-01-04 DIAGNOSIS — M6281 Muscle weakness (generalized): Secondary | ICD-10-CM | POA: Diagnosis not present

## 2020-01-04 NOTE — Therapy (Signed)
[facility] [address] [place], [place], [address] Phone: [phone]   Fax:  [phone]  Physical Therapy Evaluation  Patient Details  Name: Eryck Negron MRN: [id number] Date of Birth: [birth year] Referring Provider (PT): Dr. Andrena Mews   Encounter Date: 01/04/2020   PT End of Session - 01/04/20 1835    Visit Number 1    Number of Visits 17    Date for PT Re-Evaluation 02/29/20    Authorization Type Medicare    Authorization - Visit Number 1    Authorization - Number of Visits 10    Progress Note Due on Visit 10    PT Start Time 0845    PT Stop Time 0930    PT Time Calculation (min) 45 min    Equipment Utilized During Treatment Gait belt    Activity Tolerance Patient limited by fatigue    Behavior During Therapy Anxious           Past Medical History:  Diagnosis Date  . CARDIAC ARREST 11/01/2009   Qualifier: Diagnosis of  By: Selena Batten CMA, [place]    . Colon polyps 01/02/2016   Colonoscopy July 2017 - One 3 mm polyp in the transverse colon, removed with a cold biopsy forceps. Resected and retrieved. - One 3 mm polyp in the rectum, removed with a cold biopsy forceps. Resected and retrieved. - Diverticulosis in the entire examined colon. - Non-bleeding internal hemorrhoids. - The examination was otherwise normal. - Significant looping which prolonged cecal    . Coronary artery disease 08/15/2009   with AMI  . Coronary atherosclerosis 11/01/2009   Qualifier: Diagnosis of  By: Selena Batten CMA, Jewel    . Dialysis patient ([facility]) 09/27/2014   mon, wed, and fri  . Diverticulosis of colon without hemorrhage 01/02/2016   Colonoscopy July 2017 - One 3 mm polyp in the transverse colon, removed with a cold biopsy forceps. Resected and retrieved. - One 3 mm polyp in the rectum, removed with a cold biopsy forceps. Resected and retrieved. - Diverticulosis in the entire examined colon. - Non-bleeding internal  hemorrhoids. - The examination was otherwise normal. - Significant looping which prolonged cecal    . DM (diabetes mellitus), type 2 with renal complications ([facility] [place]) 47/42/5956  . Essential hypertension 11/01/2009   Qualifier: Diagnosis of  By: Selena Batten CMA, Jewel    . Gout 10/21/2018  . History of acute respiratory distress syndrome (ARDS) due to COVID-19 virus   . History of cardiac arrest 04/27/2019  . History of non-ST elevation myocardial infarction (NSTEMI) 04/18/2019  . history of respiratory arrest   . Hyperlipidemia   . Hypertension   . Mitral valve regurgitation 09/19/2015   Echo 09/2015   . 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 OSA treated with BiPAP 06/17/2010  . Sleep apnea    cpap nightly    Past Surgical History:  Procedure Laterality Date  . AV FISTULA PLACEMENT Left 09/27/2014  . INSERTION OF ARTERIOVENOUS (AV) ARTEGRAFT ARM Left 04/02/2018   Procedure: INSERTION OF ARTERIOVENOUS (AV) ARTEGRAFT ARM LEFT UPPER ARM;  Surgeon: Waynetta Sandy, MD;  Location: [facility];  Service: Vascular;  Laterality: Left;  . INSERTION OF DIALYSIS CATHETER N/A 04/02/2018   Procedure: INSERTION OF DIALYSIS CATHETER, right internal jugular;  Surgeon: Waynetta Sandy, MD;  Location: [facility];  Service: Vascular;  Laterality: N/A;  . NECK SURGERY     C6 & C7 replaced 30 yrs ago per pt  . REVISON OF ARTERIOVENOUS FISTULA Left 04/02/2018  Procedure: REVISION PLICATION OF ARTERIOVENOUS FISTULA ARM;  Surgeon: Waynetta Sandy, MD;  Location: [facility];  Service: Vascular;  Laterality: Left;  . WISDOM TOOTH EXTRACTION      There were no vitals filed for this visit.    Subjective Assessment - 01/04/20 1818    Subjective Patient reports of hospital admission from 04/14/19 to 06/16/19 due to Covid pneumonia and ARDS. He suffered a heart attack.  He is on dialysis for ESRD. He was in rehab until 07/28/19 after hospital discharge. Prior to recent hospital admission, he was a Administrator.  Patient reports of myoclonic jerking in bil UE and LE that causes difficulty with ADLs, self care, and walking. Pt relies on walker for ambulation. Home therapy ended about a month ago.    Patient is accompained by: Family member   daughter   Pertinent History CHF, HTN, ERSD    Limitations Standing;Walking;Writing;House hold activities;Lifting    How long can you sit comfortably? supported sitting no problem    How long can you stand comfortably? <5 min    How long can you walk comfortably? 20 feet with walker    Patient Stated Goals improve walking get stronger    Currently in Pain? No/denies              [facility] PT Assessment - 01/04/20 7096      Assessment   Medical Diagnosis Weakness    Referring Provider (PT) Dr. Andrena Mews    Onset Date/Surgical Date 12/21/19    Hand Dominance Right    Prior Therapy inpatient, subacute, home health      Precautions   Precautions Fall   no BP in L arm (fistula)     Restrictions   Weight Bearing Restrictions No      Balance Screen   Has the patient fallen in the past 6 months Yes    How many times? 5-6    Has the patient had a decrease in activity level because of a fear of falling?  Yes    Is the patient reluctant to leave their home because of a fear of falling?  No      Home Environment   Living Environment Private residence    Living Arrangements Children    Available Help at Discharge Family    Type of [place] to enter    Entrance Stairs-Number of Steps 1   at daughter's house and his home as well   Entrance Stairs-Rails None    Home Layout Two level    Alternate Level Stairs-Number of Steps 17   17 stairs at daughter, 15 stairs at his house with 1 landing   Alternate Level Stairs-Rails Right    [place] - 2 wheels;Shower seat      Prior Function   Level of Independence Requires assistive device for independence;Needs assistance with ADLs;Needs assistance with homemaking;Needs  assistance with gait;Needs assistance with transfers    Vocation Unemployed   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     Cognition   Overall Cognitive Status Within Functional Limits for tasks assessed    Attention Focused    Focused Attention Appears intact    Memory Appears intact    Awareness Appears intact    Problem Solving Appears intact      Ambulation/Gait   Ambulation/Gait Yes    Ambulation/Gait Assistance 4: Min guard    Ambulation Distance (Feet) 20 Feet   with one 180 deg turn  Assistive device Rolling walker    Gait Pattern Decreased stride length;Antalgic;Decreased trunk rotation;Trunk flexed;Wide base of support;Poor foot clearance - left;Poor foot clearance - right   increased WB on bil UE, knees buckled 1 time dur to tremors.   Ambulation Surface Level;Indoor      Standardized Balance Assessment   Standardized Balance Assessment Timed Up and Go Test;Berg Balance Test      Berg Balance Test   Sit to Stand Able to stand  independently using hands    Standing Unsupported Able to stand safely 2 minutes    Sitting with Back Unsupported but Feet Supported on Floor or Stool Able to sit safely and securely 2 minutes    Stand to Sit Controls descent by using hands    Transfers Able to transfer safely, definite need of hands    Standing Unsupported with Eyes Closed Able to stand 10 seconds with supervision    Standing Unsupported with Feet Together Needs help to attain position and unable to hold for 15 seconds    From Standing, Reach Forward with Outstretched Arm Reaches forward but needs supervision    From Standing Position, Pick up Object from Floor Able to pick up shoe, needs supervision    From Standing Position, Turn to Look Behind Over each Shoulder Looks behind from both sides and weight shifts well    Turn 360 Degrees Needs close supervision or verbal cueing    Standing Unsupported, Alternately Place Feet on Step/Stool Needs assistance to keep from falling or unable to try     Standing Unsupported, One Foot in Front Needs help to step but can hold 15 seconds    Standing on One Leg Unable to try or needs assist to prevent fall    Total Score 30    Berg comment: high risk for fall.      Timed Up and Go Test   Normal TUG (seconds) 36                      Objective measurements completed on examination: See above findings.                 PT Short Term Goals - 01/04/20 1825      PT SHORT TERM GOAL #1   Title Patient will be able to ambulate 50 feet with RW with SBA to improve home ambulation    Baseline <20 feet with RW min A    Time 4    Period Weeks    Status New    Target Date 02/01/20      PT SHORT TERM GOAL #2   Title Patient will be able to maintain partial tandem stance for 15 seconds to improve static balance    Baseline 5-10 sec    Time 4    Period Weeks    Target Date 02/01/20      PT SHORT TERM GOAL #3   Title Patient will be able to go up and don 8 steps with use of one rail with CGA to improve ability to go up and down the stairs at home.    Baseline Pt goes up the stairs but requires min A at top of the steps and pt scoots down on butt when coming down    Time 4    Period Weeks    Status New    Target Date 02/01/20      PT SHORT TERM GOAL #4   Title Patient will only use  RW for indoor mobility at home (no wheelchair use) to improve functional mobility    Baseline uses wheelchair and wlaker at home    Time 4    Period Weeks    Status New    Target Date 02/01/20             PT Long Term Goals - 01/04/20 1829      PT LONG TERM GOAL #1   Title Patient will be able to ambulate >1000 feet with RW to improve community ambulation    Baseline 20 feet with RW    Time 8    Period Weeks    Status New    Target Date 02/29/20      PT LONG TERM GOAL #2   Title Patient will be able to perform TUG <25 seconds with RW to improve overall functional mobility    Baseline 36 seconds with RW    Time 8    Period  Weeks    Status New    Target Date 02/29/20      PT LONG TERM GOAL #3   Title Patient will be able to go up and down 17 steps with use of one rail with sBA to improve ability to go up and down steps    Baseline min A going up and scoots down on his butt    Time 8    Period Weeks    Status New    Target Date 02/29/20      PT LONG TERM GOAL #4   Title Patient will demo 9 points improvement on Berg balance scale to improve functional balance and reduce fall risk    Baseline 30/56    Time 8    Period Weeks    Status New    Target Date 02/29/20                  Plan - 01/04/20 8101    Clinical Impression Statement Patient is a 54 y.o. male who was seen today for physical therapy evaluation and treatment for gait and mobility disorder, and gerneralized weakness from prolonged hospitalization due to Covid last year. Patient is reporting myclonic tremors which makes it difficult for him to walk, transfer, perform ADLs, and self care. Patient demonstrated 34 seconds on timed up and go test and 30/56 on berg balance scale which demonstrates significantly impaired mobility and puts patient in high fall risk. Patient will benefit from skilled PT to address impairments of gait disorder, balance disorder, weakness to improve overall function and reduce fall risk.    Personal Factors and Comorbidities Comorbidity 3+;Past/Current Experience;Time since onset of injury/illness/exacerbation;Transportation;Fitness;Age    Comorbidities CHF, ESRD, HTN    Examination-Activity Limitations Bathing;Bed Mobility;Caring for Others;Carry;Dressing;Hygiene/Grooming;Lift;Locomotion Level;Self Feeding;Sit;Squat;Stairs;Stand;Toileting;Transfers    Examination-Participation Restrictions Cleaning;Community Activity;Driving;Interpersonal Relationship;Laundry;Medication Management;Meal Prep;Shop;Yard Work    Stability/Clinical Decision Making Evolving/Moderate complexity    Clinical Decision Making Moderate    Rehab  Potential Good    PT Frequency 2x / week    PT Duration 8 weeks    PT Treatment/Interventions ADLs/Self Care Home Management;Gait training;Stair training;Functional mobility training;Therapeutic activities;Therapeutic exercise;Balance training;Neuromuscular re-education;Patient/family education;Manual techniques;Passive range of motion;Joint Manipulations    PT Next Visit Plan Issue HEP    PT Home Exercise Plan Start walking with family besides him and following behind with wheelchair. Start with 2-5 min    Consulted and Agree with Plan of Care Patient;Family member/caregiver    Family Member Consulted daughter  Patient will benefit from skilled therapeutic intervention in order to improve the following deficits and impairments:  Abnormal gait, Decreased activity tolerance, Decreased balance, Decreased mobility, Decreased endurance, Decreased coordination, Decreased range of motion, Decreased safety awareness, Decreased strength, Difficulty walking, Impaired flexibility, Postural dysfunction, Impaired UE functional use, Impaired tone  Visit Diagnosis: Muscle weakness (generalized)  Unsteadiness on feet     Problem List Patient Active Problem List   Diagnosis Date Noted  . 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 Hypoxic brain injury ([place]) 08/12/2019  . Gait abnormality 08/12/2019  . Coagulase negative Staphylococcus bacteremia 06/30/2019  . History of gout 06/17/2019  . 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 Pain aggravated by sitting 06/09/2019  . Bowel incontinence 06/09/2019  . Protein-calorie malnutrition ([facility]) 06/01/2019  . Weakness acquired in ICU   . Anoxic brain injury ([facility])   . Physical deconditioning   . History of cardiac arrest 04/27/2019  . History of non-ST elevation myocardial infarction (NSTEMI) 04/18/2019  . Pulmonary hypertension ([facility]) 04/14/2019  . Morbid obesity ([place] [facility]) 04/14/2019  . Diabetic neuropathy ([facility]) 04/02/2016  . Hypertension associated with diabetes  ([place]) 10/03/2015  . HLD (hyperlipidemia) 09/06/2015  . Stage 5 chronic kidney disease on chronic dialysis ([facility]) 08/31/2015  . DM (diabetes mellitus), type 2 with renal complications ([place]) 37/29/0211  . OSA treated with BiPAP 12/12/2013  . Coronary atherosclerosis 11/01/2009    Kerrie Pleasure 01/04/2020, 6:37 PM  [facility] [address] [place], [place], [address] Phone: [phone]   Fax:  [phone]  Name: Chael Urenda MRN: [id number] Date of Birth: 1966-06-15

## 2020-01-04 NOTE — Addendum Note (Signed)
Addended by: Kerrie Pleasure on: 01/04/2020 06:38 PM   Modules accepted: Orders

## 2020-01-05 DIAGNOSIS — Z992 Dependence on renal dialysis: Secondary | ICD-10-CM | POA: Diagnosis not present

## 2020-01-05 DIAGNOSIS — N186 End stage renal disease: Secondary | ICD-10-CM | POA: Diagnosis not present

## 2020-01-05 DIAGNOSIS — N2581 Secondary hyperparathyroidism of renal origin: Secondary | ICD-10-CM | POA: Diagnosis not present

## 2020-01-05 DIAGNOSIS — D631 Anemia in chronic kidney disease: Secondary | ICD-10-CM | POA: Diagnosis not present

## 2020-01-05 DIAGNOSIS — E1121 Type 2 diabetes mellitus with diabetic nephropathy: Secondary | ICD-10-CM | POA: Diagnosis not present

## 2020-01-06 ENCOUNTER — Ambulatory Visit: Payer: Medicare Other

## 2020-01-06 ENCOUNTER — Other Ambulatory Visit: Payer: Self-pay

## 2020-01-06 DIAGNOSIS — R2681 Unsteadiness on feet: Secondary | ICD-10-CM

## 2020-01-06 DIAGNOSIS — M6281 Muscle weakness (generalized): Secondary | ICD-10-CM | POA: Diagnosis not present

## 2020-01-06 NOTE — Therapy (Signed)
[facility] [address] [place], [place], [address] Phone: [phone]   Fax:  [phone]  Physical Therapy Treatment  Patient Details  Name: Troy Wells MRN: [id number] Date of Birth: [birth year] Referring Provider (PT): Dr. Andrena Mews   Encounter Date: 01/06/2020   PT End of Session - 01/06/20 2057    Visit Number 2    Number of Visits 17    Date for PT Re-Evaluation 02/29/20    Authorization Type Medicare    Authorization - Visit Number 2    Authorization - Number of Visits 10    Progress Note Due on Visit 10    PT Start Time 1400    PT Stop Time 1445    PT Time Calculation (min) 45 min    Equipment Utilized During Treatment Gait belt    Activity Tolerance Patient limited by fatigue    Behavior During Therapy Anxious           Past Medical History:  Diagnosis Date  . CARDIAC ARREST 11/01/2009   Qualifier: Diagnosis of  By: Selena Batten CMA, [place]    . Colon polyps 01/02/2016   Colonoscopy July 2017 - One 3 mm polyp in the transverse colon, removed with a cold biopsy forceps. Resected and retrieved. - One 3 mm polyp in the rectum, removed with a cold biopsy forceps. Resected and retrieved. - Diverticulosis in the entire examined colon. - Non-bleeding internal hemorrhoids. - The examination was otherwise normal. - Significant looping which prolonged cecal    . Coronary artery disease 08/15/2009   with AMI  . Coronary atherosclerosis 11/01/2009   Qualifier: Diagnosis of  By: Selena Batten CMA, Jewel    . Dialysis patient ([facility]) 09/27/2014   mon, wed, and fri  . Diverticulosis of colon without hemorrhage 01/02/2016   Colonoscopy July 2017 - One 3 mm polyp in the transverse colon, removed with a cold biopsy forceps. Resected and retrieved. - One 3 mm polyp in the rectum, removed with a cold biopsy forceps. Resected and retrieved. - Diverticulosis in the entire examined colon. - Non-bleeding internal  hemorrhoids. - The examination was otherwise normal. - Significant looping which prolonged cecal    . DM (diabetes mellitus), type 2 with renal complications ([facility]) 31/51/7616  . Essential hypertension 11/01/2009   Qualifier: Diagnosis of  By: Selena Batten CMA, Jewel    . Gout 10/21/2018  . History of acute respiratory distress syndrome (ARDS) due to COVID-19 virus   . History of cardiac arrest 04/27/2019  . History of non-ST elevation myocardial infarction (NSTEMI) 04/18/2019  . history of respiratory arrest   . Hyperlipidemia   . Hypertension   . Mitral valve regurgitation 09/19/2015   Echo 09/2015   . 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 OSA treated with BiPAP 06/17/2010  . Sleep apnea    cpap nightly    Past Surgical History:  Procedure Laterality Date  . AV FISTULA PLACEMENT Left 09/27/2014  . INSERTION OF ARTERIOVENOUS (AV) ARTEGRAFT ARM Left 04/02/2018   Procedure: INSERTION OF ARTERIOVENOUS (AV) ARTEGRAFT ARM LEFT UPPER ARM;  Surgeon: Waynetta Sandy, MD;  Location: [facility];  Service: Vascular;  Laterality: Left;  . INSERTION OF DIALYSIS CATHETER N/A 04/02/2018   Procedure: INSERTION OF DIALYSIS CATHETER, right internal jugular;  Surgeon: Waynetta Sandy, MD;  Location: [facility];  Service: Vascular;  Laterality: N/A;  . NECK SURGERY     C6 & C7 replaced 30 yrs ago per pt  . REVISON OF ARTERIOVENOUS FISTULA Left 04/02/2018  Procedure: REVISION PLICATION OF ARTERIOVENOUS FISTULA ARM;  Surgeon: Waynetta Sandy, MD;  Location: [facility];  Service: Vascular;  Laterality: Left;  . WISDOM TOOTH EXTRACTION      There were no vitals filed for this visit.   Subjective Assessment - 01/06/20 1415    Subjective PT reports no new falls.    Pertinent History CHF, HTN, ERSD    Limitations Standing;Walking;Writing;House hold activities;Lifting    How long can you sit comfortably? supported sitting no problem    How long can you stand comfortably? <5 min    How long can you walk  comfortably? 20 feet with walker    Patient Stated Goals improve walking get stronger    Currently in Pain? No/denies             Treatment Standing hip flexion, abduction, extension: 2 x 10 (pt's leg froze during second set up hip flexion and couldn't do reciprocal marching and had to do one leg first to unfreeze) Heel raises: 2 x 10 Mini squats: 2 x 10  Standing without HHA support: mini step forward and mini step backward: 5x R and L Standing wide BOS: horizontal head turns: 10x R and L  Stair training: 4 steps, bil hand rail, min to mod A, step to pattern going up R and L leg; step to pattern coming down. First tried coming sideways but patient had freezing incident and legs buckled which required mod A, when coming straight forward with step to pattern, he did much better.   Access Code: [email or web address] URL: [email or web address] Date: 01/06/2020 Prepared by: Markus Jarvis  Exercises Standing March with Counter Support - 1 x daily - 7 x weekly - 2 sets - 10 reps Standing Hip Abduction with Counter Support - 1 x daily - 7 x weekly - 2 sets - 10 reps Standing Hip Extension with Counter Support - 1 x daily - 7 x weekly - 2 sets - 10 reps Standing Heel Raise with Support - 1 x daily - 7 x weekly - 2 sets - 10 reps Mini Squat with Counter Support - 1 x daily - 7 x weekly - 2 sets - 10 reps                        PT Education - 01/06/20 2056    Education Details Pt educated on compliance with HEP. Pt educated to gradually reduce the number of hours he is lying down to increase sitting hours and increased amount of time he is on his feet doing stuff around the house under supervision.    Person(s) Educated Patient    Methods Explanation    Comprehension Verbalized understanding            PT Short Term Goals - 01/04/20 1825      PT SHORT TERM GOAL #1   Title Patient will be able to ambulate 50 feet with RW with SBA to improve home ambulation     Baseline <20 feet with RW min A    Time 4    Period Weeks    Status New    Target Date 02/01/20      PT SHORT TERM GOAL #2   Title Patient will be able to maintain partial tandem stance for 15 seconds to improve static balance    Baseline 5-10 sec    Time 4    Period Weeks    Target Date 02/01/20      PT SHORT  TERM GOAL #3   Title Patient will be able to go up and don 8 steps with use of one rail with CGA to improve ability to go up and down the stairs at home.    Baseline Pt goes up the stairs but requires min A at top of the steps and pt scoots down on butt when coming down    Time 4    Period Weeks    Status New    Target Date 02/01/20      PT SHORT TERM GOAL #4   Title Patient will only use RW for indoor mobility at home (no wheelchair use) to improve functional mobility    Baseline uses wheelchair and wlaker at home    Time 4    Period Weeks    Status New    Target Date 02/01/20             PT Long Term Goals - 01/04/20 1829      PT LONG TERM GOAL #1   Title Patient will be able to ambulate >1000 feet with RW to improve community ambulation    Baseline 20 feet with RW    Time 8    Period Weeks    Status New    Target Date 02/29/20      PT LONG TERM GOAL #2   Title Patient will be able to perform TUG <25 seconds with RW to improve overall functional mobility    Baseline 36 seconds with RW    Time 8    Period Weeks    Status New    Target Date 02/29/20      PT LONG TERM GOAL #3   Title Patient will be able to go up and down 17 steps with use of one rail with sBA to improve ability to go up and down steps    Baseline min A going up and scoots down on his butt    Time 8    Period Weeks    Status New    Target Date 02/29/20      PT LONG TERM GOAL #4   Title Patient will demo 9 points improvement on Berg balance scale to improve functional balance and reduce fall risk    Baseline 30/56    Time 8    Period Weeks    Status New    Target Date 02/29/20                  Plan - 01/06/20 2050    Clinical Impression Statement Pt tolerated session well. Patient demonstrated myclonic tremors with certain activities today. He needed min A on stairs as his legs buckled while coming down.    Personal Factors and Comorbidities Comorbidity 3+;Past/Current Experience;Time since onset of injury/illness/exacerbation;Transportation;Fitness;Age    Comorbidities CHF, ESRD, HTN    Examination-Activity Limitations Bathing;Bed Mobility;Caring for Others;Carry;Dressing;Hygiene/Grooming;Lift;Locomotion Level;Self Feeding;Sit;Squat;Stairs;Stand;Toileting;Transfers    Examination-Participation Restrictions Cleaning;Community Activity;Driving;Interpersonal Relationship;Laundry;Medication Management;Meal Prep;Shop;Yard Work    Stability/Clinical Decision Making Evolving/Moderate complexity    Rehab Potential Good    PT Frequency 2x / week    PT Duration 8 weeks    PT Treatment/Interventions ADLs/Self Care Home Management;Gait training;Stair training;Functional mobility training;Therapeutic activities;Therapeutic exercise;Balance training;Neuromuscular re-education;Patient/family education;Manual techniques;Passive range of motion;Joint Manipulations    PT Next Visit Plan Issue HEP    PT Home Exercise Plan Start walking with family besides him and following behind with wheelchair. Start with 2-5 min    Consulted and Agree with Plan of Care Patient;Family member/caregiver  Family Member Consulted daughter           Patient will benefit from skilled therapeutic intervention in order to improve the following deficits and impairments:  Abnormal gait, Decreased activity tolerance, Decreased balance, Decreased mobility, Decreased endurance, Decreased coordination, Decreased range of motion, Decreased safety awareness, Decreased strength, Difficulty walking, Impaired flexibility, Postural dysfunction, Impaired UE functional use, Impaired tone  Visit Diagnosis: Muscle  weakness (generalized)  Unsteadiness on feet     Problem List Patient Active Problem List   Diagnosis Date Noted  . 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 Hypoxic brain injury ([facility]) 08/12/2019  . Gait abnormality 08/12/2019  . Coagulase negative Staphylococcus bacteremia 06/30/2019  . History of gout 06/17/2019  . 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 Pain aggravated by sitting 06/09/2019  . Bowel incontinence 06/09/2019  . Protein-calorie malnutrition ([facility]) 06/01/2019  . Weakness acquired in ICU   . Anoxic brain injury ([facility])   . Physical deconditioning   . History of cardiac arrest 04/27/2019  . History of non-ST elevation myocardial infarction (NSTEMI) 04/18/2019  . Pulmonary hypertension ([facility]) 04/14/2019  . Morbid obesity ([facility]) 04/14/2019  . Diabetic neuropathy ([facility]) 04/02/2016  . Hypertension associated with diabetes ([facility]) 10/03/2015  . HLD (hyperlipidemia) 09/06/2015  . Stage 5 chronic kidney disease on chronic dialysis ([facility]) 08/31/2015  . DM (diabetes mellitus), type 2 with renal complications ([facility]) 33/54/5625  . OSA treated with BiPAP 12/12/2013  . Coronary atherosclerosis 11/01/2009    Kerrie Pleasure, PT 01/06/2020, 8:59 PM  [facility] [address] [place], [place], [address] Phone: [phone]   Fax:  [phone]  Name: Dodd Schmid MRN: [id number] Date of Birth: 08-19-1965

## 2020-01-07 ENCOUNTER — Other Ambulatory Visit: Payer: Self-pay | Admitting: Family Medicine

## 2020-01-07 ENCOUNTER — Other Ambulatory Visit: Payer: Self-pay

## 2020-01-07 DIAGNOSIS — Z992 Dependence on renal dialysis: Secondary | ICD-10-CM | POA: Diagnosis not present

## 2020-01-07 DIAGNOSIS — R630 Anorexia: Secondary | ICD-10-CM

## 2020-01-07 DIAGNOSIS — D631 Anemia in chronic kidney disease: Secondary | ICD-10-CM | POA: Diagnosis not present

## 2020-01-07 DIAGNOSIS — E1121 Type 2 diabetes mellitus with diabetic nephropathy: Secondary | ICD-10-CM | POA: Diagnosis not present

## 2020-01-07 DIAGNOSIS — N2581 Secondary hyperparathyroidism of renal origin: Secondary | ICD-10-CM | POA: Diagnosis not present

## 2020-01-07 DIAGNOSIS — N186 End stage renal disease: Secondary | ICD-10-CM | POA: Diagnosis not present

## 2020-01-07 DIAGNOSIS — E782 Mixed hyperlipidemia: Secondary | ICD-10-CM

## 2020-01-07 MED ORDER — PRO-STAT SUGAR FREE PO LIQD: 30.0000 mL | Freq: Two times a day (BID) | ORAL | 4 refills | Status: AC

## 2020-01-07 MED ORDER — CETIRIZINE HCL 5 MG PO TABS: 5.0000 mg | ORAL_TABLET | Freq: Every day | ORAL | 1 refills | Status: AC

## 2020-01-07 MED ORDER — MIRTAZAPINE 15 MG PO TABS: 15.0000 mg | ORAL_TABLET | Freq: Every day | ORAL | 1 refills | Status: AC

## 2020-01-07 NOTE — Telephone Encounter (Signed)
Patient should still have refills for this meds.  Please confirm with the patient to ensure he has enough supply.

## 2020-01-07 NOTE — Telephone Encounter (Signed)
Patients daughter calls nurse line reporting they just came back from vacation and she left his medication bag. Daughter reports the ones that she did not leave and made home with.   Atorvastatin  Allopurinol  Carvedilol Keppra  Daughter is requesting all "others be filled." We went through the list and I have pended them. I advised patients daughter insurance may not cover these again, if we are able to send in.

## 2020-01-10 DIAGNOSIS — Z992 Dependence on renal dialysis: Secondary | ICD-10-CM | POA: Diagnosis not present

## 2020-01-10 DIAGNOSIS — N186 End stage renal disease: Secondary | ICD-10-CM | POA: Diagnosis not present

## 2020-01-10 DIAGNOSIS — D631 Anemia in chronic kidney disease: Secondary | ICD-10-CM | POA: Diagnosis not present

## 2020-01-10 DIAGNOSIS — E1121 Type 2 diabetes mellitus with diabetic nephropathy: Secondary | ICD-10-CM | POA: Diagnosis not present

## 2020-01-10 DIAGNOSIS — N2581 Secondary hyperparathyroidism of renal origin: Secondary | ICD-10-CM | POA: Diagnosis not present

## 2020-01-12 ENCOUNTER — Ambulatory Visit: Payer: Medicare Other | Admitting: Physical Therapy

## 2020-01-12 DIAGNOSIS — D631 Anemia in chronic kidney disease: Secondary | ICD-10-CM | POA: Diagnosis not present

## 2020-01-12 DIAGNOSIS — N2581 Secondary hyperparathyroidism of renal origin: Secondary | ICD-10-CM | POA: Diagnosis not present

## 2020-01-12 DIAGNOSIS — N186 End stage renal disease: Secondary | ICD-10-CM | POA: Diagnosis not present

## 2020-01-12 DIAGNOSIS — E1121 Type 2 diabetes mellitus with diabetic nephropathy: Secondary | ICD-10-CM | POA: Diagnosis not present

## 2020-01-12 DIAGNOSIS — Z992 Dependence on renal dialysis: Secondary | ICD-10-CM | POA: Diagnosis not present

## 2020-01-13 ENCOUNTER — Other Ambulatory Visit: Payer: Self-pay

## 2020-01-13 ENCOUNTER — Ambulatory Visit: Payer: Medicare Other | Admitting: Physical Therapy

## 2020-01-13 DIAGNOSIS — M6281 Muscle weakness (generalized): Secondary | ICD-10-CM

## 2020-01-13 DIAGNOSIS — R2681 Unsteadiness on feet: Secondary | ICD-10-CM | POA: Diagnosis not present

## 2020-01-13 NOTE — Therapy (Signed)
[facility] [address] [place], [place], [address] Phone: [phone]   Fax:  [phone]  Physical Therapy Treatment  Patient Details  Name: Troy Wells MRN: [id number] Date of Birth: [birth year] Referring Provider (PT): Dr. Andrena Mews   Encounter Date: 01/13/2020   PT End of Session - 01/13/20 1723    Visit Number 3    Number of Visits 17    Date for PT Re-Evaluation 02/29/20    Authorization Type Medicare    Authorization - Visit Number 3    Authorization - Number of Visits 10    Progress Note Due on Visit 10    PT Start Time 1018    PT Stop Time 1100    PT Time Calculation (min) 42 min    Equipment Utilized During Treatment Gait belt    Activity Tolerance Patient limited by fatigue   Tremors in legs with fatigue   Behavior During Therapy Anxious           Past Medical History:  Diagnosis Date  . CARDIAC ARREST 11/01/2009   Qualifier: Diagnosis of  By: Selena Batten CMA, [place]    . Colon polyps 01/02/2016   Colonoscopy July 2017 - One 3 mm polyp in the transverse colon, removed with a cold biopsy forceps. Resected and retrieved. - One 3 mm polyp in the rectum, removed with a cold biopsy forceps. Resected and retrieved. - Diverticulosis in the entire examined colon. - Non-bleeding internal hemorrhoids. - The examination was otherwise normal. - Significant looping which prolonged cecal    . Coronary artery disease 08/15/2009   with AMI  . Coronary atherosclerosis 11/01/2009   Qualifier: Diagnosis of  By: Selena Batten CMA, Jewel    . Dialysis patient ([facility]) 09/27/2014   mon, wed, and fri  . Diverticulosis of colon without hemorrhage 01/02/2016   Colonoscopy July 2017 - One 3 mm polyp in the transverse colon, removed with a cold biopsy forceps. Resected and retrieved. - One 3 mm polyp in the rectum, removed with a cold biopsy forceps. Resected and retrieved. - Diverticulosis in the entire examined colon.  - Non-bleeding internal hemorrhoids. - The examination was otherwise normal. - Significant looping which prolonged cecal    . DM (diabetes mellitus), type 2 with renal complications ([facility]) 34/91/7915  . Essential hypertension 11/01/2009   Qualifier: Diagnosis of  By: Selena Batten CMA, Jewel    . Gout 10/21/2018  . History of acute respiratory distress syndrome (ARDS) due to COVID-19 virus   . History of cardiac arrest 04/27/2019  . History of non-ST elevation myocardial infarction (NSTEMI) 04/18/2019  . history of respiratory arrest   . Hyperlipidemia   . Hypertension   . Mitral valve regurgitation 09/19/2015   Echo 09/2015   . 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 OSA treated with BiPAP 06/17/2010  . Sleep apnea    cpap nightly    Past Surgical History:  Procedure Laterality Date  . AV FISTULA PLACEMENT Left 09/27/2014  . INSERTION OF ARTERIOVENOUS (AV) ARTEGRAFT ARM Left 04/02/2018   Procedure: INSERTION OF ARTERIOVENOUS (AV) ARTEGRAFT ARM LEFT UPPER ARM;  Surgeon: Waynetta Sandy, MD;  Location: [facility];  Service: Vascular;  Laterality: Left;  . INSERTION OF DIALYSIS CATHETER N/A 04/02/2018   Procedure: INSERTION OF DIALYSIS CATHETER, right internal jugular;  Surgeon: Waynetta Sandy, MD;  Location: [facility];  Service: Vascular;  Laterality: N/A;  . NECK SURGERY     C6 & C7 replaced 30 yrs ago per pt  .  REVISON OF ARTERIOVENOUS FISTULA Left 04/02/2018   Procedure: REVISION PLICATION OF ARTERIOVENOUS FISTULA ARM;  Surgeon: Waynetta Sandy, MD;  Location: [facility];  Service: Vascular;  Laterality: Left;  . WISDOM TOOTH EXTRACTION      There were no vitals filed for this visit.   Subjective Assessment - 01/13/20 1022    Subjective No changes, no falls since last visit. Walks with walker at the house sometimes.  Feel better than I did yesterday (after dialysis, had to cancel).    Pertinent History CHF, HTN, ERSD    Limitations Standing;Walking;Writing;House hold  activities;Lifting    How long can you sit comfortably? supported sitting no problem    How long can you stand comfortably? <5 min    How long can you walk comfortably? 20 feet with walker    Patient Stated Goals improve walking get stronger    Currently in Pain? No/denies                             [facility] Adult PT Treatment/Exercise - 01/13/20 0001      Transfers   Transfers Sit to Stand;Stand to Sit    Sit to Stand 5: Supervision;From elevated surface;From bed;Without upper extremity assist;With upper extremity assist;From chair/3-in-1   No UE support from elevated mat; UE support from chair   Stand to Sit 5: Supervision;To elevated surface;To bed;Without upper extremity assist;To chair/3-in-1;With upper extremity assist    Comments 5 reps from mat; 5 reps throughout session from w/c      Ambulation/Gait   Ambulation/Gait Yes    Ambulation/Gait Assistance 4: Min guard    Ambulation Distance (Feet) 75 Feet    Assistive device Rolling walker    Gait Pattern Decreased stride length;Decreased trunk rotation;Trunk flexed;Wide base of support;Poor foot clearance - left;Poor foot clearance - right    Ambulation Surface Level;Indoor    Gait Comments Pt reports walking short distances at home (with daughter in home, but not close supervision).  Educated patient to have family member with him when he is trying to walk at home due to increased fall risk.  No family member present today during session for gait training education.      High Level Balance   High Level Balance Activities Side stepping    High Level Balance Comments Along counter, 2 reps R and L      Exercises   Exercises Knee/Hip;Ankle      Knee/Hip Exercises: Stretches   Active Hamstring Stretch Right;Left;2 reps;30 seconds   foot propped on floor, then 4" surface for added stretch   Active Hamstring Stretch Limitations Cues for posture and technique    Gastroc Stretch Right;Left;2 reps;10 seconds     Gastroc Stretch Limitations SEated propped foot on ground, cues to pull toes towards body for stretch      Knee/Hip Exercises: Standing   Knee Flexion Strengthening;Right;Left;1 set;10 reps    Knee Flexion Limitations Cues for technique, upright posture      Ankle Exercises: Seated   Toe Raise 10 reps    Toe Raise Limitations cues for slowed pace           REviewed HEP given last visit:  Exercises (performed each x 10 reps, standing at counter) Standing March with Counter Support - 1 x daily - 7 x weekly - 2 sets - 10 reps Standing Hip Abduction with Counter Support - 1 x daily - 7 x weekly - 2 sets -  10 reps Standing Hip Extension with Counter Support - 1 x daily - 7 x weekly - 2 sets - 10 reps Standing Heel Raise with Support - 1 x daily - 7 x weekly - 2 sets - 10 reps Mini Squat with Counter Support - 1 x daily - 7 x weekly - 2 sets - 10 reps  Postural cues provided throughout, as pt tends to look down at feet.  Additional NMR (in addition to sidestepping) -Attempted alt step taps to 4" block, pt unable to perform; unable to bring one foot up with support of BUE at counter.  Transitioned to wide BOS lateral weigthshifting x 10 reps.  Then standing with feet shoulder width apart:  Alt UE lifts to counter, x 10 reps each side.       PT Education - 01/13/20 1722    Education Details additions to HEP-see instructions.  Educated pt to not walk at home unless someone is with him for safety.    Person(s) Educated Patient    Methods Explanation;Demonstration;Handout    Comprehension Verbalized understanding;Returned demonstration            PT Short Term Goals - 01/04/20 1825      PT SHORT TERM GOAL #1   Title Patient will be able to ambulate 50 feet with RW with SBA to improve home ambulation    Baseline <20 feet with RW min A    Time 4    Period Weeks    Status New    Target Date 02/01/20      PT SHORT TERM GOAL #2   Title Patient will be able to maintain partial  tandem stance for 15 seconds to improve static balance    Baseline 5-10 sec    Time 4    Period Weeks    Target Date 02/01/20      PT SHORT TERM GOAL #3   Title Patient will be able to go up and don 8 steps with use of one rail with CGA to improve ability to go up and down the stairs at home.    Baseline Pt goes up the stairs but requires min A at top of the steps and pt scoots down on butt when coming down    Time 4    Period Weeks    Status New    Target Date 02/01/20      PT SHORT TERM GOAL #4   Title Patient will only use RW for indoor mobility at home (no wheelchair use) to improve functional mobility    Baseline uses wheelchair and wlaker at home    Time 4    Period Weeks    Status New    Target Date 02/01/20             PT Long Term Goals - 01/04/20 1829      PT LONG TERM GOAL #1   Title Patient will be able to ambulate >1000 feet with RW to improve community ambulation    Baseline 20 feet with RW    Time 8    Period Weeks    Status New    Target Date 02/29/20      PT LONG TERM GOAL #2   Title Patient will be able to perform TUG <25 seconds with RW to improve overall functional mobility    Baseline 36 seconds with RW    Time 8    Period Weeks    Status New    Target Date 02/29/20  PT LONG TERM GOAL #3   Title Patient will be able to go up and down 17 steps with use of one rail with sBA to improve ability to go up and down steps    Baseline min A going up and scoots down on his butt    Time 8    Period Weeks    Status New    Target Date 02/29/20      PT LONG TERM GOAL #4   Title Patient will demo 9 points improvement on Berg balance scale to improve functional balance and reduce fall risk    Baseline 30/56    Time 8    Period Weeks    Status New    Target Date 02/29/20                 Plan - 01/13/20 1725    Clinical Impression Statement Reviewed HEP and added additional exercises to HEP.  Pt able to improve gait distance in therapy  session today, but no family member present for education in walking program at home.  Pt requires seated rest breaks during standing exercises due to BLE tremors.  He will continue to benefit from skilled PT to further address strength, balance, gait for improved functional mobility and independence.    Personal Factors and Comorbidities Comorbidity 3+;Past/Current Experience;Time since onset of injury/illness/exacerbation;Transportation;Fitness;Age    Comorbidities CHF, ESRD, HTN    Examination-Activity Limitations Bathing;Bed Mobility;Caring for Others;Carry;Dressing;Hygiene/Grooming;Lift;Locomotion Level;Self Feeding;Sit;Squat;Stairs;Stand;Toileting;Transfers    Examination-Participation Restrictions Cleaning;Community Activity;Driving;Interpersonal Relationship;Laundry;Medication Management;Meal Prep;Shop;Yard Work    Stability/Clinical Decision Making Evolving/Moderate complexity    Rehab Potential Good    PT Frequency 2x / week    PT Duration 8 weeks    PT Treatment/Interventions ADLs/Self Care Home Management;Gait training;Stair training;Functional mobility training;Therapeutic activities;Therapeutic exercise;Balance training;Neuromuscular re-education;Patient/family education;Manual techniques;Passive range of motion;Joint Manipulations    PT Next Visit Plan Review updated to HEP; family education (if present) for walking program at home.  Continue to address strength, balance, gait training towards goals.    PT Home Exercise Plan Start walking with family besides him and following behind with wheelchair. Start with 2-5 min    Consulted and Agree with Plan of Care Patient           Patient will benefit from skilled therapeutic intervention in order to improve the following deficits and impairments:  Abnormal gait, Decreased activity tolerance, Decreased balance, Decreased mobility, Decreased endurance, Decreased coordination, Decreased range of motion, Decreased safety awareness, Decreased  strength, Difficulty walking, Impaired flexibility, Postural dysfunction, Impaired UE functional use, Impaired tone  Visit Diagnosis: Muscle weakness (generalized)  Unsteadiness on feet     Problem List Patient Active Problem List   Diagnosis Date Noted  . 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 Hypoxic brain injury ([facility]) 08/12/2019  . Gait abnormality 08/12/2019  . Coagulase negative Staphylococcus bacteremia 06/30/2019  . History of gout 06/17/2019  . 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 Pain aggravated by sitting 06/09/2019  . Bowel incontinence 06/09/2019  . Protein-calorie malnutrition ([facility]) 06/01/2019  . Weakness acquired in ICU   . Anoxic brain injury ([facility])   . Physical deconditioning   . History of cardiac arrest 04/27/2019  . History of non-ST elevation myocardial infarction (NSTEMI) 04/18/2019  . Pulmonary hypertension ([facility]) 04/14/2019  . Morbid obesity ([facility]) 04/14/2019  . Diabetic neuropathy ([facility]) 04/02/2016  . Hypertension associated with diabetes ([facility]) 10/03/2015  . HLD (hyperlipidemia) 09/06/2015  . Stage 5 chronic kidney disease on chronic dialysis ([facility]) 08/31/2015  . DM (diabetes  mellitus), type 2 with renal complications ([facility]) 68/37/2902  . OSA treated with BiPAP 12/12/2013  . Coronary atherosclerosis 11/01/2009    Rodney Yera W. 01/13/2020, 5:30 PM Frazier Butt., PT  [facility] [address] [place], [place], [address] Phone: [phone]   Fax:  [phone]  Name: Reeves Musick MRN: [id number] Date of Birth: Mar 28, 1966

## 2020-01-13 NOTE — Patient Instructions (Addendum)
Access Code: [email or web address] URL: [email or web address] Date: 01/13/2020 Prepared by: Mady Haagensen  Exercises Standing March with Counter Support - 1 x daily - 7 x weekly - 2 sets - 10 reps Standing Hip Abduction with Counter Support - 1 x daily - 7 x weekly - 2 sets - 10 reps Standing Hip Extension with Counter Support - 1 x daily - 7 x weekly - 2 sets - 10 reps Standing Heel Raise with Support - 1 x daily - 7 x weekly - 2 sets - 10 reps Mini Squat with Counter Support - 1 x daily - 7 x weekly - 2 sets - 10 reps  Additions 01/13/2020: Side Stepping with Counter Support - 1 x daily - 7 x weekly - 2 sets Standing Hamstring Curl with Chair Support - 1 x daily - 7 x weekly - 2 sets - 10 reps Sit to Stand - 1 x daily - 7 x weekly - 2 sets - 10 reps

## 2020-01-14 DIAGNOSIS — Z992 Dependence on renal dialysis: Secondary | ICD-10-CM | POA: Diagnosis not present

## 2020-01-14 DIAGNOSIS — N186 End stage renal disease: Secondary | ICD-10-CM | POA: Diagnosis not present

## 2020-01-14 DIAGNOSIS — D631 Anemia in chronic kidney disease: Secondary | ICD-10-CM | POA: Diagnosis not present

## 2020-01-14 DIAGNOSIS — E1121 Type 2 diabetes mellitus with diabetic nephropathy: Secondary | ICD-10-CM | POA: Diagnosis not present

## 2020-01-14 DIAGNOSIS — N2581 Secondary hyperparathyroidism of renal origin: Secondary | ICD-10-CM | POA: Diagnosis not present

## 2020-01-16 DIAGNOSIS — N186 End stage renal disease: Secondary | ICD-10-CM | POA: Diagnosis not present

## 2020-01-16 DIAGNOSIS — Z992 Dependence on renal dialysis: Secondary | ICD-10-CM | POA: Diagnosis not present

## 2020-01-16 DIAGNOSIS — E1122 Type 2 diabetes mellitus with diabetic chronic kidney disease: Secondary | ICD-10-CM | POA: Diagnosis not present

## 2020-01-17 DIAGNOSIS — E1121 Type 2 diabetes mellitus with diabetic nephropathy: Secondary | ICD-10-CM | POA: Diagnosis not present

## 2020-01-17 DIAGNOSIS — N186 End stage renal disease: Secondary | ICD-10-CM | POA: Diagnosis not present

## 2020-01-17 DIAGNOSIS — Z992 Dependence on renal dialysis: Secondary | ICD-10-CM | POA: Diagnosis not present

## 2020-01-17 DIAGNOSIS — N2581 Secondary hyperparathyroidism of renal origin: Secondary | ICD-10-CM | POA: Diagnosis not present

## 2020-01-17 DIAGNOSIS — D631 Anemia in chronic kidney disease: Secondary | ICD-10-CM | POA: Diagnosis not present

## 2020-01-18 ENCOUNTER — Encounter: Payer: Self-pay | Admitting: Physical Therapy

## 2020-01-18 ENCOUNTER — Other Ambulatory Visit: Payer: Self-pay

## 2020-01-18 ENCOUNTER — Ambulatory Visit: Payer: Medicare Other | Attending: Family Medicine | Admitting: Physical Therapy

## 2020-01-18 DIAGNOSIS — M6281 Muscle weakness (generalized): Secondary | ICD-10-CM | POA: Insufficient documentation

## 2020-01-18 DIAGNOSIS — R2681 Unsteadiness on feet: Secondary | ICD-10-CM | POA: Insufficient documentation

## 2020-01-18 NOTE — Therapy (Signed)
[facility] [address] [place], [place], [address] Phone: [phone]   Fax:  [phone]  Physical Therapy Treatment  Patient Details  Name: Desmund Elman MRN: [id number] Date of Birth: Nov 13, 1965 Referring Provider (PT): Dr. Andrena Mews   Encounter Date: 01/18/2020   PT End of Session - 01/18/20 0808    Visit Number 4    Number of Visits 17    Date for PT Re-Evaluation 02/29/20    Authorization Type Medicare    Progress Note Due on Visit 10    PT Start Time 0804    PT Stop Time 0846    PT Time Calculation (min) 42 min    Equipment Utilized During Treatment Gait belt    Activity Tolerance Patient limited by fatigue;Patient tolerated treatment well;No increased pain   Tremors in legs with fatigue   Behavior During Therapy [facility] for tasks assessed/performed           Past Medical History:  Diagnosis Date  . CARDIAC ARREST 11/01/2009   Qualifier: Diagnosis of  By: Selena Batten CMA, Jewel    . Colon polyps 01/02/2016   Colonoscopy July 2017 - One 3 mm polyp in the transverse colon, removed with a cold biopsy forceps. Resected and retrieved. - One 3 mm polyp in the rectum, removed with a cold biopsy forceps. Resected and retrieved. - Diverticulosis in the entire examined colon. - Non-bleeding internal hemorrhoids. - The examination was otherwise normal. - Significant looping which prolonged cecal    . Coronary artery disease 08/15/2009   with AMI  . Coronary atherosclerosis 11/01/2009   Qualifier: Diagnosis of  By: Selena Batten CMA, Jewel    . Dialysis patient ([place]) 09/27/2014   mon, wed, and fri  . Diverticulosis of colon without hemorrhage 01/02/2016   Colonoscopy July 2017 - One 3 mm polyp in the transverse colon, removed with a cold biopsy forceps. Resected and retrieved. - One 3 mm polyp in the rectum, removed with a cold biopsy forceps. Resected and retrieved. - Diverticulosis in the entire examined colon.  - Non-bleeding internal hemorrhoids. - The examination was otherwise normal. - Significant looping which prolonged cecal    . DM (diabetes mellitus), type 2 with renal complications ([place]) 50/08/7046  . Essential hypertension 11/01/2009   Qualifier: Diagnosis of  By: Selena Batten CMA, Jewel    . Gout 10/21/2018  . History of acute respiratory distress syndrome (ARDS) due to COVID-19 virus   . History of cardiac arrest 04/27/2019  . History of non-ST elevation myocardial infarction (NSTEMI) 04/18/2019  . history of respiratory arrest   . Hyperlipidemia   . Hypertension   . Mitral valve regurgitation 09/19/2015   Echo 09/2015   . 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 OSA treated with BiPAP 06/17/2010  . Sleep apnea    cpap nightly    Past Surgical History:  Procedure Laterality Date  . AV FISTULA PLACEMENT Left 09/27/2014  . INSERTION OF ARTERIOVENOUS (AV) ARTEGRAFT ARM Left 04/02/2018   Procedure: INSERTION OF ARTERIOVENOUS (AV) ARTEGRAFT ARM LEFT UPPER ARM;  Surgeon: Waynetta Sandy, MD;  Location: [facility];  Service: Vascular;  Laterality: Left;  . INSERTION OF DIALYSIS CATHETER N/A 04/02/2018   Procedure: INSERTION OF DIALYSIS CATHETER, right internal jugular;  Surgeon: Waynetta Sandy, MD;  Location: [facility];  Service: Vascular;  Laterality: N/A;  . NECK SURGERY     C6 & C7 replaced 30 yrs ago per pt  . REVISON OF ARTERIOVENOUS FISTULA Left 04/02/2018   Procedure:  REVISION PLICATION OF ARTERIOVENOUS FISTULA ARM;  Surgeon: Waynetta Sandy, MD;  Location: [facility];  Service: Vascular;  Laterality: Left;  . WISDOM TOOTH EXTRACTION      There were no vitals filed for this visit.   Subjective Assessment - 01/18/20 0807    Subjective No new complaints. No falls or pain to report. Reports the sit<>stands on HEP are getting easier.    Pertinent History CHF, HTN, ERSD    Limitations Standing;Walking;Writing;House hold activities;Lifting    How long can you stand comfortably? <5  min    How long can you walk comfortably? 20 feet with walker    Patient Stated Goals improve walking get stronger    Currently in Pain? No/denies                  [facility] Adult PT Treatment/Exercise - 01/18/20 0809      Transfers   Transfers Sit to Stand;Stand to Sit    Sit to Stand 5: Supervision;With upper extremity assist;From chair/3-in-1    Stand to Sit 5: Supervision;With upper extremity assist;To chair/3-in-1    Transfer Cueing cues for correct hand placement to assist wtih transfers as pt attempted to pull up on RW.       Ambulation/Gait   Ambulation/Gait Yes    Ambulation/Gait Assistance 4: Min guard    Ambulation/Gait Assistance Details SaO2 97%, HR 92 bpm after 1st gait rep. SaO2 94%, HR 102 after 2cd gait rep. Pt with incr tremors on second gait rep vs first. cues with both gait reps for forward gaze, upright posture and to stay closer to RW with gait.     Ambulation Distance (Feet) 115 Feet   x2   Assistive device Rolling walker    Gait Pattern Decreased stride length;Decreased trunk rotation;Trunk flexed;Wide base of support;Poor foot clearance - left;Poor foot clearance - right    Ambulation Surface Level;Indoor      Knee/Hip Exercises: Aerobic   Other Aerobic Scifit with UE/LE's level 2.0 x 6 minutes with goal >/= 30 rpm for strengthening and activity tolerance. SaO2 95%, HR 85 afterwards      Knee/Hip Exercises: Seated   Long Arc Quad AROM;Strengthening;Both;2 sets;10 reps;Limitations;Weights    Long Arc Quad Weight 2 lbs.    Long [employer] Limitations 3 sec holds each rep with cues for slow, controlled movements    Other Seated Knee/Hip Exercises with 2# ankle weights: heel<>toe raises with 3 sec holds for 10 reps, 2 sets done.     Hamstring Curl AROM;Strengthening;Both;2 sets;10 reps;Limitations    Hamstring Limitations with green band resistance. cues for slow, controlled movements                 PT Short Term Goals - 01/04/20 1825      PT SHORT  TERM GOAL #1   Title Patient will be able to ambulate 50 feet with RW with SBA to improve home ambulation    Baseline <20 feet with RW min A    Time 4    Period Weeks    Status New    Target Date 02/01/20      PT SHORT TERM GOAL #2   Title Patient will be able to maintain partial tandem stance for 15 seconds to improve static balance    Baseline 5-10 sec    Time 4    Period Weeks    Target Date 02/01/20      PT SHORT TERM GOAL #3   Title Patient will be  able to go up and don 8 steps with use of one rail with CGA to improve ability to go up and down the stairs at home.    Baseline Pt goes up the stairs but requires min A at top of the steps and pt scoots down on butt when coming down    Time 4    Period Weeks    Status New    Target Date 02/01/20      PT SHORT TERM GOAL #4   Title Patient will only use RW for indoor mobility at home (no wheelchair use) to improve functional mobility    Baseline uses wheelchair and wlaker at home    Time 4    Period Weeks    Status New    Target Date 02/01/20             PT Long Term Goals - 01/04/20 1829      PT LONG TERM GOAL #1   Title Patient will be able to ambulate >1000 feet with RW to improve community ambulation    Baseline 20 feet with RW    Time 8    Period Weeks    Status New    Target Date 02/29/20      PT LONG TERM GOAL #2   Title Patient will be able to perform TUG <25 seconds with RW to improve overall functional mobility    Baseline 36 seconds with RW    Time 8    Period Weeks    Status New    Target Date 02/29/20      PT LONG TERM GOAL #3   Title Patient will be able to go up and down 17 steps with use of one rail with sBA to improve ability to go up and down steps    Baseline min A going up and scoots down on his butt    Time 8    Period Weeks    Status New    Target Date 02/29/20      PT LONG TERM GOAL #4   Title Patient will demo 9 points improvement on Berg balance scale to improve functional balance  and reduce fall risk    Baseline 30/56    Time 8    Period Weeks    Status New    Target Date 02/29/20                 Plan - 01/18/20 0808    Clinical Impression Statement Today's skilled session continued to focus on strengthening and activity tolerance with stable vital readings. Pt did demo increased tremors as he fatigued. No other issues reported or noted in session. The pt is progressing toward goals and should benefit from continued PT to progress toward unmet goals.    Personal Factors and Comorbidities Comorbidity 3+;Past/Current Experience;Time since onset of injury/illness/exacerbation;Transportation;Fitness;Age    Comorbidities CHF, ESRD, HTN    Examination-Activity Limitations Bathing;Bed Mobility;Caring for Others;Carry;Dressing;Hygiene/Grooming;Lift;Locomotion Level;Self Feeding;Sit;Squat;Stairs;Stand;Toileting;Transfers    Examination-Participation Restrictions Cleaning;Community Activity;Driving;Interpersonal Relationship;Laundry;Medication Management;Meal Prep;Shop;Yard Work    Stability/Clinical Decision Making Evolving/Moderate complexity    Rehab Potential Good    PT Frequency 2x / week    PT Duration 8 weeks    PT Treatment/Interventions ADLs/Self Care Home Management;Gait training;Stair training;Functional mobility training;Therapeutic activities;Therapeutic exercise;Balance training;Neuromuscular re-education;Patient/family education;Manual techniques;Passive range of motion;Joint Manipulations    PT Next Visit Plan Continue to address strength, balance, gait training towards goals.    PT Home Exercise Plan Start walking with family besides him  and following behind with wheelchair. Start with 2-5 min    Consulted and Agree with Plan of Care Patient           Patient will benefit from skilled therapeutic intervention in order to improve the following deficits and impairments:  Abnormal gait, Decreased activity tolerance, Decreased balance, Decreased  mobility, Decreased endurance, Decreased coordination, Decreased range of motion, Decreased safety awareness, Decreased strength, Difficulty walking, Impaired flexibility, Postural dysfunction, Impaired UE functional use, Impaired tone  Visit Diagnosis: Muscle weakness (generalized)  Unsteadiness on feet     Problem List Patient Active Problem List   Diagnosis Date Noted  . 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 Hypoxic brain injury ([place]) 08/12/2019  . Gait abnormality 08/12/2019  . Coagulase negative Staphylococcus bacteremia 06/30/2019  . History of gout 06/17/2019  . 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 Pain aggravated by sitting 06/09/2019  . Bowel incontinence 06/09/2019  . Protein-calorie malnutrition ([place]) 06/01/2019  . Weakness acquired in ICU   . Anoxic brain injury ([place])   . Physical deconditioning   . History of cardiac arrest 04/27/2019  . History of non-ST elevation myocardial infarction (NSTEMI) 04/18/2019  . Pulmonary hypertension ([place]) 04/14/2019  . Morbid obesity ([place]) 04/14/2019  . Diabetic neuropathy ([place]) 04/02/2016  . Hypertension associated with diabetes ([place]) 10/03/2015  . HLD (hyperlipidemia) 09/06/2015  . Stage 5 chronic kidney disease on chronic dialysis ([place]) 08/31/2015  . DM (diabetes mellitus), type 2 with renal complications ([place]) 40/98/1191  . OSA treated with BiPAP 12/12/2013  . Coronary atherosclerosis 11/01/2009    Willow Ora, PTA, [facility] [address] [place], [place] [address] [phone] 01/18/20, 4:44 PM   Name: Ricki Clack MRN: [id number] Date of Birth: January 20, 1966

## 2020-01-19 DIAGNOSIS — N2581 Secondary hyperparathyroidism of renal origin: Secondary | ICD-10-CM | POA: Diagnosis not present

## 2020-01-19 DIAGNOSIS — N186 End stage renal disease: Secondary | ICD-10-CM | POA: Diagnosis not present

## 2020-01-19 DIAGNOSIS — E1121 Type 2 diabetes mellitus with diabetic nephropathy: Secondary | ICD-10-CM | POA: Diagnosis not present

## 2020-01-19 DIAGNOSIS — Z992 Dependence on renal dialysis: Secondary | ICD-10-CM | POA: Diagnosis not present

## 2020-01-19 DIAGNOSIS — D631 Anemia in chronic kidney disease: Secondary | ICD-10-CM | POA: Diagnosis not present

## 2020-01-20 ENCOUNTER — Ambulatory Visit: Payer: Medicare Other | Admitting: Physical Therapy

## 2020-01-20 ENCOUNTER — Encounter: Payer: Self-pay | Admitting: Physical Therapy

## 2020-01-20 ENCOUNTER — Other Ambulatory Visit: Payer: Self-pay

## 2020-01-20 DIAGNOSIS — M6281 Muscle weakness (generalized): Secondary | ICD-10-CM

## 2020-01-20 DIAGNOSIS — R2681 Unsteadiness on feet: Secondary | ICD-10-CM | POA: Diagnosis not present

## 2020-01-21 DIAGNOSIS — E1121 Type 2 diabetes mellitus with diabetic nephropathy: Secondary | ICD-10-CM | POA: Diagnosis not present

## 2020-01-21 DIAGNOSIS — N186 End stage renal disease: Secondary | ICD-10-CM | POA: Diagnosis not present

## 2020-01-21 DIAGNOSIS — N2581 Secondary hyperparathyroidism of renal origin: Secondary | ICD-10-CM | POA: Diagnosis not present

## 2020-01-21 DIAGNOSIS — Z992 Dependence on renal dialysis: Secondary | ICD-10-CM | POA: Diagnosis not present

## 2020-01-21 DIAGNOSIS — D631 Anemia in chronic kidney disease: Secondary | ICD-10-CM | POA: Diagnosis not present

## 2020-01-21 NOTE — Therapy (Signed)
[facility] [address] [place], [place], [address] Phone: [phone]   Fax:  [phone]  Physical Therapy Treatment  Patient Details  Name: Sammuel Blick MRN: [id number] Date of Birth: [birth year] Referring Provider (PT): Dr. Andrena Mews   Encounter Date: 01/20/2020   PT End of Session - 01/20/20 1451    Visit Number 5    Number of Visits 17    Date for PT Re-Evaluation 02/29/20    Authorization Type Medicare    Progress Note Due on Visit 10    PT Start Time 1937    PT Stop Time 1530    PT Time Calculation (min) 41 min    Equipment Utilized During Treatment Gait belt    Activity Tolerance Patient limited by fatigue;Patient tolerated treatment well;No increased pain   Tremors in legs with fatigue   Behavior During Therapy [facility] for tasks assessed/performed           Past Medical History:  Diagnosis Date  . CARDIAC ARREST 11/01/2009   Qualifier: Diagnosis of  By: Selena Batten CMA, Jewel    . Colon polyps 01/02/2016   Colonoscopy July 2017 - One 3 mm polyp in the transverse colon, removed with a cold biopsy forceps. Resected and retrieved. - One 3 mm polyp in the rectum, removed with a cold biopsy forceps. Resected and retrieved. - Diverticulosis in the entire examined colon. - Non-bleeding internal hemorrhoids. - The examination was otherwise normal. - Significant looping which prolonged cecal    . Coronary artery disease 08/15/2009   with AMI  . Coronary atherosclerosis 11/01/2009   Qualifier: Diagnosis of  By: Selena Batten CMA, Jewel    . Dialysis patient ([place]) 09/27/2014   mon, wed, and fri  . Diverticulosis of colon without hemorrhage 01/02/2016   Colonoscopy July 2017 - One 3 mm polyp in the transverse colon, removed with a cold biopsy forceps. Resected and retrieved. - One 3 mm polyp in the rectum, removed with a cold biopsy forceps. Resected and retrieved. - Diverticulosis in the entire examined colon.  - Non-bleeding internal hemorrhoids. - The examination was otherwise normal. - Significant looping which prolonged cecal    . DM (diabetes mellitus), type 2 with renal complications ([place]) 90/24/0973  . Essential hypertension 11/01/2009   Qualifier: Diagnosis of  By: Selena Batten CMA, Jewel    . Gout 10/21/2018  . History of acute respiratory distress syndrome (ARDS) due to COVID-19 virus   . History of cardiac arrest 04/27/2019  . History of non-ST elevation myocardial infarction (NSTEMI) 04/18/2019  . history of respiratory arrest   . Hyperlipidemia   . Hypertension   . Mitral valve regurgitation 09/19/2015   Echo 09/2015   . 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 OSA treated with BiPAP 06/17/2010  . Sleep apnea    cpap nightly    Past Surgical History:  Procedure Laterality Date  . AV FISTULA PLACEMENT Left 09/27/2014  . INSERTION OF ARTERIOVENOUS (AV) ARTEGRAFT ARM Left 04/02/2018   Procedure: INSERTION OF ARTERIOVENOUS (AV) ARTEGRAFT ARM LEFT UPPER ARM;  Surgeon: Waynetta Sandy, MD;  Location: [facility];  Service: Vascular;  Laterality: Left;  . INSERTION OF DIALYSIS CATHETER N/A 04/02/2018   Procedure: INSERTION OF DIALYSIS CATHETER, right internal jugular;  Surgeon: Waynetta Sandy, MD;  Location: [facility];  Service: Vascular;  Laterality: N/A;  . NECK SURGERY     C6 & C7 replaced 30 yrs ago per pt  . REVISON OF ARTERIOVENOUS FISTULA Left 04/02/2018   Procedure:  REVISION PLICATION OF ARTERIOVENOUS FISTULA ARM;  Surgeon: Waynetta Sandy, MD;  Location: [facility];  Service: Vascular;  Laterality: Left;  . WISDOM TOOTH EXTRACTION      There were no vitals filed for this visit.   Subjective Assessment - 01/20/20 1451    Subjective No new complaints. No falls or pain to report.    Pertinent History CHF, HTN, ERSD    Limitations Standing;Walking;Writing;House hold activities;Lifting    How long can you sit comfortably? supported sitting no problem    How long can you stand  comfortably? <5 min    How long can you walk comfortably? 20 feet with walker    Patient Stated Goals improve walking get stronger    Currently in Pain? No/denies               [facility] Adult PT Treatment/Exercise - 01/20/20 1453      Transfers   Transfers Sit to Stand;Stand to Sit    Sit to Stand 5: Supervision;With upper extremity assist;From chair/3-in-1    Stand to Sit 5: Supervision;With upper extremity assist;To chair/3-in-1      Ambulation/Gait   Ambulation/Gait Yes    Ambulation/Gait Assistance 4: Min guard;5: Supervision    Ambulation/Gait Assistance Details HR 76, SaO2 96%. HR 106, SaO2 95% after 1st gait rep of 230 feet. cues on posture and walker position. One episode of right knee mild buckling with pt self correcting.     Ambulation Distance (Feet) 230 Feet   x1, plus around gym with session   Assistive device Rolling walker    Gait Pattern Decreased stride length;Decreased trunk rotation;Trunk flexed;Wide base of support;Poor foot clearance - left;Poor foot clearance - right    Ambulation Surface Level;Indoor      High Level Balance   High Level Balance Activities Side stepping;Marching forwards;Marching backwards;Tandem walking   tandem walking fwd/bwd   High Level Balance Comments on floor progressing to on blue mat in parallel bars with bil UE support for 3 laps each on each surface. min guard assist with cues on posture, weight shifting and ex form/technique      Knee/Hip Exercises: Standing   Lateral Step Up Both;1 set;10 reps;Hand Hold: 2;Step Height: 6";Limitations    Lateral Step Up Limitations cues on form and technique, min guard assist for safety with bil UE support on bars    Forward Step Up Both;1 set;10 reps;Hand Hold: 2;Step Height: 6";Limitations    Forward Step Up Limitations cues on form and technique, min guard assit with bil UE support on bars                 PT Short Term Goals - 01/04/20 1825      PT SHORT TERM GOAL #1   Title Patient  will be able to ambulate 50 feet with RW with SBA to improve home ambulation    Baseline <20 feet with RW min A    Time 4    Period Weeks    Status New    Target Date 02/01/20      PT SHORT TERM GOAL #2   Title Patient will be able to maintain partial tandem stance for 15 seconds to improve static balance    Baseline 5-10 sec    Time 4    Period Weeks    Target Date 02/01/20      PT SHORT TERM GOAL #3   Title Patient will be able to go up and don 8 steps with use of one  rail with CGA to improve ability to go up and down the stairs at home.    Baseline Pt goes up the stairs but requires min A at top of the steps and pt scoots down on butt when coming down    Time 4    Period Weeks    Status New    Target Date 02/01/20      PT SHORT TERM GOAL #4   Title Patient will only use RW for indoor mobility at home (no wheelchair use) to improve functional mobility    Baseline uses wheelchair and wlaker at home    Time 4    Period Weeks    Status New    Target Date 02/01/20             PT Long Term Goals - 01/04/20 1829      PT LONG TERM GOAL #1   Title Patient will be able to ambulate >1000 feet with RW to improve community ambulation    Baseline 20 feet with RW    Time 8    Period Weeks    Status New    Target Date 02/29/20      PT LONG TERM GOAL #2   Title Patient will be able to perform TUG <25 seconds with RW to improve overall functional mobility    Baseline 36 seconds with RW    Time 8    Period Weeks    Status New    Target Date 02/29/20      PT LONG TERM GOAL #3   Title Patient will be able to go up and down 17 steps with use of one rail with sBA to improve ability to go up and down steps    Baseline min A going up and scoots down on his butt    Time 8    Period Weeks    Status New    Target Date 02/29/20      PT LONG TERM GOAL #4   Title Patient will demo 9 points improvement on Berg balance scale to improve functional balance and reduce fall risk     Baseline 30/56    Time 8    Period Weeks    Status New    Target Date 02/29/20                 Plan - 01/20/20 1452    Clinical Impression Statement Today's skilled session continued to focus on activity tolerance, balance and strengthening with rest breaks taken as needed due to fatigue and to allow pt's HR to recover. The pt is making steady progress toward goals with increased consective gait distance this session and less rest breaks needed. The pt should benefit from continued PT to progress toward unmet goals.    Personal Factors and Comorbidities Comorbidity 3+;Past/Current Experience;Time since onset of injury/illness/exacerbation;Transportation;Fitness;Age    Comorbidities CHF, ESRD, HTN    Examination-Activity Limitations Bathing;Bed Mobility;Caring for Others;Carry;Dressing;Hygiene/Grooming;Lift;Locomotion Level;Self Feeding;Sit;Squat;Stairs;Stand;Toileting;Transfers    Examination-Participation Restrictions Cleaning;Community Activity;Driving;Interpersonal Relationship;Laundry;Medication Management;Meal Prep;Shop;Yard Work    Stability/Clinical Decision Making Evolving/Moderate complexity    Rehab Potential Good    PT Frequency 2x / week    PT Duration 8 weeks    PT Treatment/Interventions ADLs/Self Care Home Management;Gait training;Stair training;Functional mobility training;Therapeutic activities;Therapeutic exercise;Balance training;Neuromuscular re-education;Patient/family education;Manual techniques;Passive range of motion;Joint Manipulations    PT Next Visit Plan Continue to address strength, balance, gait training towards goals.    PT Home Exercise Plan Start walking with family besides him  and following behind with wheelchair. Start with 2-5 min    Consulted and Agree with Plan of Care Patient           Patient will benefit from skilled therapeutic intervention in order to improve the following deficits and impairments:  Abnormal gait, Decreased activity  tolerance, Decreased balance, Decreased mobility, Decreased endurance, Decreased coordination, Decreased range of motion, Decreased safety awareness, Decreased strength, Difficulty walking, Impaired flexibility, Postural dysfunction, Impaired UE functional use, Impaired tone  Visit Diagnosis: Muscle weakness (generalized)  Unsteadiness on feet     Problem List Patient Active Problem List   Diagnosis Date Noted  . 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 Hypoxic brain injury ([place]) 08/12/2019  . Gait abnormality 08/12/2019  . Coagulase negative Staphylococcus bacteremia 06/30/2019  . History of gout 06/17/2019  . 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 Pain aggravated by sitting 06/09/2019  . Bowel incontinence 06/09/2019  . Protein-calorie malnutrition ([place]) 06/01/2019  . Weakness acquired in ICU   . Anoxic brain injury ([place])   . Physical deconditioning   . History of cardiac arrest 04/27/2019  . History of non-ST elevation myocardial infarction (NSTEMI) 04/18/2019  . Pulmonary hypertension ([place]) 04/14/2019  . Morbid obesity ([place]) 04/14/2019  . Diabetic neuropathy ([place]) 04/02/2016  . Hypertension associated with diabetes ([place]) 10/03/2015  . HLD (hyperlipidemia) 09/06/2015  . Stage 5 chronic kidney disease on chronic dialysis ([place]) 08/31/2015  . DM (diabetes mellitus), type 2 with renal complications ([place]) 81/15/7262  . OSA treated with BiPAP 12/12/2013  . Coronary atherosclerosis 11/01/2009    Willow Ora, PTA, [facility] [address], [place], [place] [address] [phone] 01/21/20, 4:38 PM   Name: Eilam Shrewsbury MRN: [id number] Date of Birth: 1965-09-09

## 2020-01-24 DIAGNOSIS — N2581 Secondary hyperparathyroidism of renal origin: Secondary | ICD-10-CM | POA: Diagnosis not present

## 2020-01-24 DIAGNOSIS — E1121 Type 2 diabetes mellitus with diabetic nephropathy: Secondary | ICD-10-CM | POA: Diagnosis not present

## 2020-01-24 DIAGNOSIS — Z992 Dependence on renal dialysis: Secondary | ICD-10-CM | POA: Diagnosis not present

## 2020-01-24 DIAGNOSIS — D631 Anemia in chronic kidney disease: Secondary | ICD-10-CM | POA: Diagnosis not present

## 2020-01-24 DIAGNOSIS — N186 End stage renal disease: Secondary | ICD-10-CM | POA: Diagnosis not present

## 2020-01-25 ENCOUNTER — Other Ambulatory Visit: Payer: Self-pay

## 2020-01-25 ENCOUNTER — Ambulatory Visit: Payer: Medicare Other

## 2020-01-25 DIAGNOSIS — R2681 Unsteadiness on feet: Secondary | ICD-10-CM | POA: Diagnosis not present

## 2020-01-25 DIAGNOSIS — M6281 Muscle weakness (generalized): Secondary | ICD-10-CM

## 2020-01-25 NOTE — Therapy (Signed)
[facility] [address] [place], [place], [address] Phone: [phone]   Fax:  [phone]  Physical Therapy Treatment  Patient Details  Name: Troy Wells MRN: [id number] Date of Birth: [birth year] Referring Provider (PT): Dr. Andrena Mews   Encounter Date: 01/25/2020   PT End of Session - 01/25/20 0808    Visit Number 6    Number of Visits 17    Date for PT Re-Evaluation 02/29/20    Authorization Type Medicare    Authorization - Visit Number 4    Authorization - Number of Visits 10    Progress Note Due on Visit 10    PT Start Time 0805    PT Stop Time 0845    PT Time Calculation (min) 40 min    Equipment Utilized During Treatment Gait belt    Activity Tolerance Patient limited by fatigue;Patient tolerated treatment well;No increased pain   Tremors in legs with fatigue   Behavior During Therapy [facility] for tasks assessed/performed           Past Medical History:  Diagnosis Date  . CARDIAC ARREST 11/01/2009   Qualifier: Diagnosis of  By: Selena Batten CMA, [place]    . Colon polyps 01/02/2016   Colonoscopy July 2017 - One 3 mm polyp in the transverse colon, removed with a cold biopsy forceps. Resected and retrieved. - One 3 mm polyp in the rectum, removed with a cold biopsy forceps. Resected and retrieved. - Diverticulosis in the entire examined colon. - Non-bleeding internal hemorrhoids. - The examination was otherwise normal. - Significant looping which prolonged cecal    . Coronary artery disease 08/15/2009   with AMI  . Coronary atherosclerosis 11/01/2009   Qualifier: Diagnosis of  By: Selena Batten CMA, Jewel    . Dialysis patient ([place]) 09/27/2014   mon, wed, and fri  . Diverticulosis of colon without hemorrhage 01/02/2016   Colonoscopy July 2017 - One 3 mm polyp in the transverse colon, removed with a cold biopsy forceps. Resected and retrieved. - One 3 mm polyp in the rectum, removed with a cold biopsy  forceps. Resected and retrieved. - Diverticulosis in the entire examined colon. - Non-bleeding internal hemorrhoids. - The examination was otherwise normal. - Significant looping which prolonged cecal    . DM (diabetes mellitus), type 2 with renal complications ([place]) 02/77/4128  . Essential hypertension 11/01/2009   Qualifier: Diagnosis of  By: Selena Batten CMA, Jewel    . Gout 10/21/2018  . History of acute respiratory distress syndrome (ARDS) due to COVID-19 virus   . History of cardiac arrest 04/27/2019  . History of non-ST elevation myocardial infarction (NSTEMI) 04/18/2019  . history of respiratory arrest   . Hyperlipidemia   . Hypertension   . Mitral valve regurgitation 09/19/2015   Echo 09/2015   . 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 OSA treated with BiPAP 06/17/2010  . Sleep apnea    cpap nightly    Past Surgical History:  Procedure Laterality Date  . AV FISTULA PLACEMENT Left 09/27/2014  . INSERTION OF ARTERIOVENOUS (AV) ARTEGRAFT ARM Left 04/02/2018   Procedure: INSERTION OF ARTERIOVENOUS (AV) ARTEGRAFT ARM LEFT UPPER ARM;  Surgeon: Waynetta Sandy, MD;  Location: [facility];  Service: Vascular;  Laterality: Left;  . INSERTION OF DIALYSIS CATHETER N/A 04/02/2018   Procedure: INSERTION OF DIALYSIS CATHETER, right internal jugular;  Surgeon: Waynetta Sandy, MD;  Location: [facility];  Service: Vascular;  Laterality: N/A;  . NECK SURGERY     C6 & C7  replaced 30 yrs ago per pt  . REVISON OF ARTERIOVENOUS FISTULA Left 04/02/2018   Procedure: REVISION PLICATION OF ARTERIOVENOUS FISTULA ARM;  Surgeon: Waynetta Sandy, MD;  Location: [facility];  Service: Vascular;  Laterality: Left;  . WISDOM TOOTH EXTRACTION      There were no vitals filed for this visit.   Subjective Assessment - 01/25/20 0806    Subjective i feel like I am getting stronger. No falls or pain to report.    Pertinent History CHF, HTN, ERSD    Limitations Standing;Walking;Writing;House hold activities;Lifting     How long can you sit comfortably? supported sitting no problem    How long can you stand comfortably? <5 min    How long can you walk comfortably? 20 feet with walker    Patient Stated Goals improve walking get stronger               TherEx: Standing heel raises: 20x bil HHA Standing hip flexion, hip abduction, hip extension: 2 x 10 R and Lbil HHA Seated break Hamstring pulls in wheelchair: 115' Box taps: 4" box: 2 x 10 R and L one HHA Seated break Standing on airex foam wide BOS: 3 x 20" no HHA Seated break Fwd step ups: 4" box: 2 x 5 R and L, Bil HHA  Gait training: 1 x 322 feet with RW, CGA and wheelchair follow   Stair traing: 1 x 4 steps with bil rail with step to pattern. Attempted second time but legs were tired so did not go past the first step.                     PT Short Term Goals - 01/25/20 0808      PT SHORT TERM GOAL #1   Title Patient will be able to ambulate 50 feet with RW with SBA to improve home ambulation    Baseline <20 feet with RW min A    Time 4    Period Weeks    Status Achieved    Target Date 02/01/20      PT SHORT TERM GOAL #2   Title Patient will be able to maintain partial tandem stance for 15 seconds to improve static balance    Baseline 5-10 sec    Time 4    Period Weeks    Target Date 02/01/20      PT SHORT TERM GOAL #3   Title Patient will be able to go up and don 8 steps with use of one rail with CGA to improve ability to go up and down the stairs at home.    Baseline Pt goes up the stairs but requires min A at top of the steps and pt scoots down on butt when coming down    Time 4    Period Weeks    Status New    Target Date 02/01/20      PT SHORT TERM GOAL #4   Title Patient will only use RW for indoor mobility at home (no wheelchair use) to improve functional mobility    Baseline uses wheelchair and wlaker at home    Time 4    Period Weeks    Status New    Target Date 02/01/20             PT  Long Term Goals - 01/04/20 1829      PT LONG TERM GOAL #1   Title Patient will be able to ambulate >1000 feet  with RW to improve community ambulation    Baseline 20 feet with RW    Time 8    Period Weeks    Status New    Target Date 02/29/20      PT LONG TERM GOAL #2   Title Patient will be able to perform TUG <25 seconds with RW to improve overall functional mobility    Baseline 36 seconds with RW    Time 8    Period Weeks    Status New    Target Date 02/29/20      PT LONG TERM GOAL #3   Title Patient will be able to go up and down 17 steps with use of one rail with sBA to improve ability to go up and down steps    Baseline min A going up and scoots down on his butt    Time 8    Period Weeks    Status New    Target Date 02/29/20      PT LONG TERM GOAL #4   Title Patient will demo 9 points improvement on Berg balance scale to improve functional balance and reduce fall risk    Baseline 30/56    Time 8    Period Weeks    Status New    Target Date 02/29/20                 Plan - 01/25/20 0836    Clinical Impression Statement Patient is starting to demonstrate improving endurance with exercises and walking as he is requiring less frequent sitting breaks. Patient demonstrated increased walking endurance compared to last session. Myoclonic jerking is still present throughout the session.    Personal Factors and Comorbidities Comorbidity 3+;Past/Current Experience;Time since onset of injury/illness/exacerbation;Transportation;Fitness;Age    Comorbidities CHF, ESRD, HTN    Examination-Activity Limitations Bathing;Bed Mobility;Caring for Others;Carry;Dressing;Hygiene/Grooming;Lift;Locomotion Level;Self Feeding;Sit;Squat;Stairs;Stand;Toileting;Transfers    Examination-Participation Restrictions Cleaning;Community Activity;Driving;Interpersonal Relationship;Laundry;Medication Management;Meal Prep;Shop;Yard Work    Stability/Clinical Decision Making Evolving/Moderate complexity      Rehab Potential Good    PT Frequency 2x / week    PT Duration 8 weeks    PT Treatment/Interventions ADLs/Self Care Home Management;Gait training;Stair training;Functional mobility training;Therapeutic activities;Therapeutic exercise;Balance training;Neuromuscular re-education;Patient/family education;Manual techniques;Passive range of motion;Joint Manipulations    PT Next Visit Plan Continue to address strength, balance, gait training towards goals.    PT Home Exercise Plan Start walking with family besides him and following behind with wheelchair. Start with 2-5 min    Consulted and Agree with Plan of Care Patient           Patient will benefit from skilled therapeutic intervention in order to improve the following deficits and impairments:  Abnormal gait, Decreased activity tolerance, Decreased balance, Decreased mobility, Decreased endurance, Decreased coordination, Decreased range of motion, Decreased safety awareness, Decreased strength, Difficulty walking, Impaired flexibility, Postural dysfunction, Impaired UE functional use, Impaired tone  Visit Diagnosis: Muscle weakness (generalized)  Unsteadiness on feet     Problem List Patient Active Problem List   Diagnosis Date Noted  . 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 Hypoxic brain injury ([place]) 08/12/2019  . Gait abnormality 08/12/2019  . Coagulase negative Staphylococcus bacteremia 06/30/2019  . History of gout 06/17/2019  . 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 Pain aggravated by sitting 06/09/2019  . Bowel incontinence 06/09/2019  . Protein-calorie malnutrition ([place]) 06/01/2019  . Weakness acquired in ICU   . Anoxic brain injury ([place])   . Physical deconditioning   . History of cardiac arrest 04/27/2019  .  History of non-ST elevation myocardial infarction (NSTEMI) 04/18/2019  . Pulmonary hypertension ([place]) 04/14/2019  . Morbid obesity ([place]) 04/14/2019  . Diabetic neuropathy ([place]) 04/02/2016  . Hypertension associated with  diabetes ([place]) 10/03/2015  . HLD (hyperlipidemia) 09/06/2015  . Stage 5 chronic kidney disease on chronic dialysis ([place]) 08/31/2015  . DM (diabetes mellitus), type 2 with renal complications ([place]) 37/99/0940  . OSA treated with BiPAP 12/12/2013  . Coronary atherosclerosis 11/01/2009    Kerrie Pleasure 01/25/2020, 8:45 AM  [facility] [address] [place], [place], [address] Phone: [phone]   Fax:  [phone]  Name: Chris Cripps MRN: [id number] Date of Birth: 28-Oct-1965

## 2020-01-26 DIAGNOSIS — N2581 Secondary hyperparathyroidism of renal origin: Secondary | ICD-10-CM | POA: Diagnosis not present

## 2020-01-26 DIAGNOSIS — D631 Anemia in chronic kidney disease: Secondary | ICD-10-CM | POA: Diagnosis not present

## 2020-01-26 DIAGNOSIS — E1121 Type 2 diabetes mellitus with diabetic nephropathy: Secondary | ICD-10-CM | POA: Diagnosis not present

## 2020-01-26 DIAGNOSIS — N186 End stage renal disease: Secondary | ICD-10-CM | POA: Diagnosis not present

## 2020-01-26 DIAGNOSIS — Z992 Dependence on renal dialysis: Secondary | ICD-10-CM | POA: Diagnosis not present

## 2020-01-27 ENCOUNTER — Ambulatory Visit: Payer: Medicare Other | Admitting: Physical Therapy

## 2020-01-27 ENCOUNTER — Other Ambulatory Visit: Payer: Self-pay

## 2020-01-27 ENCOUNTER — Encounter: Payer: Self-pay | Admitting: Physical Therapy

## 2020-01-27 DIAGNOSIS — R2681 Unsteadiness on feet: Secondary | ICD-10-CM

## 2020-01-27 DIAGNOSIS — M6281 Muscle weakness (generalized): Secondary | ICD-10-CM

## 2020-01-28 DIAGNOSIS — D631 Anemia in chronic kidney disease: Secondary | ICD-10-CM | POA: Diagnosis not present

## 2020-01-28 DIAGNOSIS — Z992 Dependence on renal dialysis: Secondary | ICD-10-CM | POA: Diagnosis not present

## 2020-01-28 DIAGNOSIS — E1121 Type 2 diabetes mellitus with diabetic nephropathy: Secondary | ICD-10-CM | POA: Diagnosis not present

## 2020-01-28 DIAGNOSIS — N2581 Secondary hyperparathyroidism of renal origin: Secondary | ICD-10-CM | POA: Diagnosis not present

## 2020-01-28 DIAGNOSIS — N186 End stage renal disease: Secondary | ICD-10-CM | POA: Diagnosis not present

## 2020-01-28 NOTE — Therapy (Signed)
[facility] [address] [place], [place], [address] Phone: [phone]   Fax:  [phone]  Physical Therapy Treatment  Patient Details  Name: Troy Wells MRN: [id number] Date of Birth: 1966/02/27 Referring Provider (PT): Dr. Andrena Mews   Encounter Date: 01/27/2020   PT End of Session - 01/27/20 0850    Visit Number 7    Number of Visits 17    Date for PT Re-Evaluation 02/29/20    Authorization Type Medicare    Progress Note Due on Visit 10    PT Start Time 0848    PT Stop Time 0927    PT Time Calculation (min) 39 min    Equipment Utilized During Treatment Gait belt    Activity Tolerance Patient limited by fatigue;Patient tolerated treatment well;No increased pain   Tremors in legs with fatigue   Behavior During Therapy [facility] for tasks assessed/performed           Past Medical History:  Diagnosis Date  . CARDIAC ARREST 11/01/2009   Qualifier: Diagnosis of  By: Selena Batten CMA, Jewel    . Colon polyps 01/02/2016   Colonoscopy July 2017 - One 3 mm polyp in the transverse colon, removed with a cold biopsy forceps. Resected and retrieved. - One 3 mm polyp in the rectum, removed with a cold biopsy forceps. Resected and retrieved. - Diverticulosis in the entire examined colon. - Non-bleeding internal hemorrhoids. - The examination was otherwise normal. - Significant looping which prolonged cecal    . Coronary artery disease 08/15/2009   with AMI  . Coronary atherosclerosis 11/01/2009   Qualifier: Diagnosis of  By: Selena Batten CMA, Jewel    . Dialysis patient ([place]) 09/27/2014   mon, wed, and fri  . Diverticulosis of colon without hemorrhage 01/02/2016   Colonoscopy July 2017 - One 3 mm polyp in the transverse colon, removed with a cold biopsy forceps. Resected and retrieved. - One 3 mm polyp in the rectum, removed with a cold biopsy forceps. Resected and retrieved. - Diverticulosis in the entire examined  colon. - Non-bleeding internal hemorrhoids. - The examination was otherwise normal. - Significant looping which prolonged cecal    . DM (diabetes mellitus), type 2 with renal complications ([place]) 20/25/4270  . Essential hypertension 11/01/2009   Qualifier: Diagnosis of  By: Selena Batten CMA, Jewel    . Gout 10/21/2018  . History of acute respiratory distress syndrome (ARDS) due to COVID-19 virus   . History of cardiac arrest 04/27/2019  . History of non-ST elevation myocardial infarction (NSTEMI) 04/18/2019  . history of respiratory arrest   . Hyperlipidemia   . Hypertension   . Mitral valve regurgitation 09/19/2015   Echo 09/2015   . 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 OSA treated with BiPAP 06/17/2010  . Sleep apnea    cpap nightly    Past Surgical History:  Procedure Laterality Date  . AV FISTULA PLACEMENT Left 09/27/2014  . INSERTION OF ARTERIOVENOUS (AV) ARTEGRAFT ARM Left 04/02/2018   Procedure: INSERTION OF ARTERIOVENOUS (AV) ARTEGRAFT ARM LEFT UPPER ARM;  Surgeon: Waynetta Sandy, MD;  Location: [facility];  Service: Vascular;  Laterality: Left;  . INSERTION OF DIALYSIS CATHETER N/A 04/02/2018   Procedure: INSERTION OF DIALYSIS CATHETER, right internal jugular;  Surgeon: Waynetta Sandy, MD;  Location: [facility];  Service: Vascular;  Laterality: N/A;  . NECK SURGERY     C6 & C7 replaced 30 yrs ago per pt  . REVISON OF ARTERIOVENOUS FISTULA Left 04/02/2018   Procedure:  REVISION PLICATION OF ARTERIOVENOUS FISTULA ARM;  Surgeon: Waynetta Sandy, MD;  Location: [facility];  Service: Vascular;  Laterality: Left;  . WISDOM TOOTH EXTRACTION      There were no vitals filed for this visit.   Subjective Assessment - 01/27/20 0850    Subjective No new complaints. No falls or pain to report.    Pertinent History CHF, HTN, ERSD    Limitations Standing;Walking;Writing;House hold activities;Lifting    How long can you sit comfortably? supported sitting no problem    How long can you  stand comfortably? <5 min    How long can you walk comfortably? 20 feet with walker    Patient Stated Goals improve walking get stronger    Currently in Pain? No/denies               [facility] Adult PT Treatment/Exercise - 01/27/20 0851      Transfers   Transfers Sit to Stand;Stand to Sit    Sit to Stand 5: Supervision;With upper extremity assist;From chair/3-in-1    Stand to Sit 5: Supervision;With upper extremity assist;To chair/3-in-1      Ambulation/Gait   Ambulation/Gait Yes    Ambulation/Gait Assistance 4: Min guard;5: Supervision    Ambulation/Gait Assistance Details short distance gait with RW around gym with session with increased assist needed as pt fatigued and tremors therefore increased    Ambulation Distance (Feet) 30 Feet   x3   Assistive device Rolling walker    Gait Pattern Decreased stride length;Decreased trunk rotation;Trunk flexed;Wide base of support;Poor foot clearance - left;Poor foot clearance - right    Ambulation Surface Level;Indoor      Neuro Re-ed    Neuro Re-ed Details  for balance/strengthening/muscle re-ed: standing on airex with feet hip width apart, UE support on bars- heel<>toe raises, alternating marching, then mini squats for 10 reps each, min guard assist for balance/safety due to tremors;  standing across red foam beam with bil UE support- alternating forward stepping to floor/back onto foam for 10 reps each side. cues on increased step length/step height. min guard assist for safety due to tremors.       Knee/Hip Exercises: Aerobic   Other Aerobic Scifit with UE/LE's level 3.0 x 8 minutes with goal >/= 30 rpm for strengthening and activity tolerance. SaO2 95%, HR 85 afterwards      Knee/Hip Exercises: Standing   Lateral Step Up Both;1 set;10 reps;Hand Hold: 2;Step Height: 6";Limitations    Lateral Step Up Limitations cues on form and technique, min guard assist for safety with bil UE support on bars    Forward Step Up Both;1 set;10 reps;Hand  Hold: 2;Step Height: 6";Limitations    Forward Step Up Limitations cues on form and technique, min guard assit with bil UE support on bars              PT Short Term Goals - 01/25/20 8099      PT SHORT TERM GOAL #1   Title Patient will be able to ambulate 50 feet with RW with SBA to improve home ambulation    Baseline <20 feet with RW min A    Time 4    Period Weeks    Status Achieved    Target Date 02/01/20      PT SHORT TERM GOAL #2   Title Patient will be able to maintain partial tandem stance for 15 seconds to improve static balance    Baseline 5-10 sec    Time 4  Period Weeks    Target Date 02/01/20      PT SHORT TERM GOAL #3   Title Patient will be able to go up and don 8 steps with use of one rail with CGA to improve ability to go up and down the stairs at home.    Baseline Pt goes up the stairs but requires min A at top of the steps and pt scoots down on butt when coming down    Time 4    Period Weeks    Status On-going    Target Date 02/01/20      PT SHORT TERM GOAL #4   Title Patient will only use RW for indoor mobility at home (no wheelchair use) to improve functional mobility    Baseline uses wheelchair and wlaker at home    Time 4    Period Weeks    Status New    Target Date 02/01/20             PT Long Term Goals - 01/04/20 1829      PT LONG TERM GOAL #1   Title Patient will be able to ambulate >1000 feet with RW to improve community ambulation    Baseline 20 feet with RW    Time 8    Period Weeks    Status New    Target Date 02/29/20      PT LONG TERM GOAL #2   Title Patient will be able to perform TUG <25 seconds with RW to improve overall functional mobility    Baseline 36 seconds with RW    Time 8    Period Weeks    Status New    Target Date 02/29/20      PT LONG TERM GOAL #3   Title Patient will be able to go up and down 17 steps with use of one rail with sBA to improve ability to go up and down steps    Baseline min A going up  and scoots down on his butt    Time 8    Period Weeks    Status New    Target Date 02/29/20      PT LONG TERM GOAL #4   Title Patient will demo 9 points improvement on Berg balance scale to improve functional balance and reduce fall risk    Baseline 30/56    Time 8    Period Weeks    Status New    Target Date 02/29/20                 Plan - 01/27/20 0851    Clinical Impression Statement Today's skilled session continued to focus on gait with RW, strengthening and balance reactions with rest breaks taken as needed due to fatigue. VSS with session with SaO2 >/= 94% throughout session and HR max 108 bpm with activity. The pt is making steady progress toward goals and should benefit from continued PT to progress toward unmet goals.    Personal Factors and Comorbidities Comorbidity 3+;Past/Current Experience;Time since onset of injury/illness/exacerbation;Transportation;Fitness;Age    Comorbidities CHF, ESRD, HTN    Examination-Activity Limitations Bathing;Bed Mobility;Caring for Others;Carry;Dressing;Hygiene/Grooming;Lift;Locomotion Level;Self Feeding;Sit;Squat;Stairs;Stand;Toileting;Transfers    Examination-Participation Restrictions Cleaning;Community Activity;Driving;Interpersonal Relationship;Laundry;Medication Management;Meal Prep;Shop;Yard Work    Stability/Clinical Decision Making Evolving/Moderate complexity    Rehab Potential Good    PT Frequency 2x / week    PT Duration 8 weeks    PT Treatment/Interventions ADLs/Self Care Home Management;Gait training;Stair training;Functional mobility training;Therapeutic activities;Therapeutic exercise;Balance training;Neuromuscular re-education;Patient/family education;Manual  techniques;Passive range of motion;Joint Manipulations    PT Next Visit Plan check STGs due 02/01/20    PT Home Exercise Plan Start walking with family besides him and following behind with wheelchair. Start with 2-5 min    Consulted and Agree with Plan of Care  Patient           Patient will benefit from skilled therapeutic intervention in order to improve the following deficits and impairments:  Abnormal gait, Decreased activity tolerance, Decreased balance, Decreased mobility, Decreased endurance, Decreased coordination, Decreased range of motion, Decreased safety awareness, Decreased strength, Difficulty walking, Impaired flexibility, Postural dysfunction, Impaired UE functional use, Impaired tone  Visit Diagnosis: Muscle weakness (generalized)  Unsteadiness on feet     Problem List Patient Active Problem List   Diagnosis Date Noted  . 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 Hypoxic brain injury ([place]) 08/12/2019  . Gait abnormality 08/12/2019  . Coagulase negative Staphylococcus bacteremia 06/30/2019  . History of gout 06/17/2019  . 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 Pain aggravated by sitting 06/09/2019  . Bowel incontinence 06/09/2019  . Protein-calorie malnutrition ([place]) 06/01/2019  . Weakness acquired in ICU   . Anoxic brain injury ([place])   . Physical deconditioning   . History of cardiac arrest 04/27/2019  . History of non-ST elevation myocardial infarction (NSTEMI) 04/18/2019  . Pulmonary hypertension ([place]) 04/14/2019  . Morbid obesity ([place]) 04/14/2019  . Diabetic neuropathy ([place]) 04/02/2016  . Hypertension associated with diabetes ([place]) 10/03/2015  . HLD (hyperlipidemia) 09/06/2015  . Stage 5 chronic kidney disease on chronic dialysis ([place]) 08/31/2015  . DM (diabetes mellitus), type 2 with renal complications ([place]) 48/06/6551  . OSA treated with BiPAP 12/12/2013  . Coronary atherosclerosis 11/01/2009    Willow Ora, PTA, [facility] [address], [place], [place] [address] [phone] 01/28/20, 12:11 PM   Name: Troy Wells MRN: [id number] Date of Birth: October 09, 1965

## 2020-01-31 DIAGNOSIS — D631 Anemia in chronic kidney disease: Secondary | ICD-10-CM | POA: Diagnosis not present

## 2020-01-31 DIAGNOSIS — Z992 Dependence on renal dialysis: Secondary | ICD-10-CM | POA: Diagnosis not present

## 2020-01-31 DIAGNOSIS — N186 End stage renal disease: Secondary | ICD-10-CM | POA: Diagnosis not present

## 2020-01-31 DIAGNOSIS — N2581 Secondary hyperparathyroidism of renal origin: Secondary | ICD-10-CM | POA: Diagnosis not present

## 2020-01-31 DIAGNOSIS — E1121 Type 2 diabetes mellitus with diabetic nephropathy: Secondary | ICD-10-CM | POA: Diagnosis not present

## 2020-02-01 ENCOUNTER — Other Ambulatory Visit: Payer: Self-pay

## 2020-02-01 ENCOUNTER — Ambulatory Visit: Payer: Medicare Other | Admitting: Physical Therapy

## 2020-02-01 DIAGNOSIS — M6281 Muscle weakness (generalized): Secondary | ICD-10-CM | POA: Diagnosis not present

## 2020-02-01 DIAGNOSIS — R2681 Unsteadiness on feet: Secondary | ICD-10-CM

## 2020-02-01 NOTE — Patient Instructions (Signed)
Access Code: [email or web address] URL: [email or web address] Date: 02/01/2020 Prepared by: Mady Haagensen  Exercises Standing March with Counter Support - 1 x daily - 7 x weekly - 2 sets - 10 reps Standing Hip Abduction with Counter Support - 1 x daily - 7 x weekly - 2 sets - 10 reps Standing Hip Extension with Counter Support - 1 x daily - 7 x weekly - 2 sets - 10 reps Standing Heel Raise with Support - 1 x daily - 7 x weekly - 2 sets - 10 reps Mini Squat with Counter Support - 1 x daily - 7 x weekly - 2 sets - 10 reps Side Stepping with Counter Support - 1 x daily - 7 x weekly - 2 sets Standing Hamstring Curl with Chair Support - 1 x daily - 7 x weekly - 2 sets - 10 reps Sit to Stand - 1 x daily - 7 x weekly - 2 sets - 10 reps  02/01/2020 added Side to side weightshift - 1 x daily - 5 x weekly - 1-2 sets - 10 reps Staggered Stance Forward Backward Weight Shift with Counter Support - 1 x daily - 5 x weekly - 1-2 sets - 10 reps

## 2020-02-01 NOTE — Therapy (Signed)
[facility] [address] [place], [place], [address] Phone: [phone]   Fax:  [phone]  Physical Therapy Treatment  Patient Details  Name: Linzy Darling MRN: [id number] Date of Birth: 11/11/1965 Referring Provider (PT): Dr. Andrena Mews   Encounter Date: 02/01/2020   PT End of Session - 02/01/20 1132    Visit Number 8    Number of Visits 17    Date for PT Re-Evaluation 02/29/20    Authorization Type Medicare    Progress Note Due on Visit 10    PT Start Time 0849    PT Stop Time 0932    PT Time Calculation (min) 43 min    Equipment Utilized During Treatment Gait belt    Activity Tolerance Patient tolerated treatment well;No increased pain   Tremors in legs with fatigue   Behavior During Therapy [facility] for tasks assessed/performed           Past Medical History:  Diagnosis Date  . CARDIAC ARREST 11/01/2009   Qualifier: Diagnosis of  By: Selena Batten CMA, [place]    . Colon polyps 01/02/2016   Colonoscopy July 2017 - One 3 mm polyp in the transverse colon, removed with a cold biopsy forceps. Resected and retrieved. - One 3 mm polyp in the rectum, removed with a cold biopsy forceps. Resected and retrieved. - Diverticulosis in the entire examined colon. - Non-bleeding internal hemorrhoids. - The examination was otherwise normal. - Significant looping which prolonged cecal    . Coronary artery disease 08/15/2009   with AMI  . Coronary atherosclerosis 11/01/2009   Qualifier: Diagnosis of  By: Selena Batten CMA, Jewel    . Dialysis patient ([place]) 09/27/2014   mon, wed, and fri  . Diverticulosis of colon without hemorrhage 01/02/2016   Colonoscopy July 2017 - One 3 mm polyp in the transverse colon, removed with a cold biopsy forceps. Resected and retrieved. - One 3 mm polyp in the rectum, removed with a cold biopsy forceps. Resected and retrieved. - Diverticulosis in the entire examined colon. - Non-bleeding internal  hemorrhoids. - The examination was otherwise normal. - Significant looping which prolonged cecal    . DM (diabetes mellitus), type 2 with renal complications ([facility]) 64/15/8309  . Essential hypertension 11/01/2009   Qualifier: Diagnosis of  By: Selena Batten CMA, Jewel    . Gout 10/21/2018  . History of acute respiratory distress syndrome (ARDS) due to COVID-19 virus   . History of cardiac arrest 04/27/2019  . History of non-ST elevation myocardial infarction (NSTEMI) 04/18/2019  . history of respiratory arrest   . Hyperlipidemia   . Hypertension   . Mitral valve regurgitation 09/19/2015   Echo 09/2015   . 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 OSA treated with BiPAP 06/17/2010  . Sleep apnea    cpap nightly    Past Surgical History:  Procedure Laterality Date  . AV FISTULA PLACEMENT Left 09/27/2014  . INSERTION OF ARTERIOVENOUS (AV) ARTEGRAFT ARM Left 04/02/2018   Procedure: INSERTION OF ARTERIOVENOUS (AV) ARTEGRAFT ARM LEFT UPPER ARM;  Surgeon: Waynetta Sandy, MD;  Location: [facility];  Service: Vascular;  Laterality: Left;  . INSERTION OF DIALYSIS CATHETER N/A 04/02/2018   Procedure: INSERTION OF DIALYSIS CATHETER, right internal jugular;  Surgeon: Waynetta Sandy, MD;  Location: [facility];  Service: Vascular;  Laterality: N/A;  . NECK SURGERY     C6 & C7 replaced 30 yrs ago per pt  . REVISON OF ARTERIOVENOUS FISTULA Left 04/02/2018   Procedure: REVISION PLICATION OF  ARTERIOVENOUS FISTULA ARM;  Surgeon: Cain, Brandon Christopher, MD;  Location: [facility];  Service: Vascular;  Laterality: Left;  . WISDOM TOOTH EXTRACTION      There were no vitals filed for this visit.   Subjective Assessment - 02/01/20 0845    Subjective Able to walk at home-to bathroom, kitchen, try not to use w/c if someone is at home.  No pain, no falls.  Pt reports feeling stronger and walking better at home.    Pertinent History CHF, HTN, ERSD    Limitations Standing;Walking;Writing;House hold activities;Lifting     How long can you sit comfortably? supported sitting no problem    How long can you stand comfortably? <5 min    How long can you walk comfortably? 20 feet with walker    Patient Stated Goals improve walking get stronger    Currently in Pain? No/denies                             [facility] Adult PT Treatment/Exercise - 02/01/20 0001      Transfers   Transfers Sit to Stand;Stand to Sit    Sit to Stand 5: Supervision;With upper extremity assist;From chair/3-in-1;From bed    Stand to Sit 5: Supervision;With upper extremity assist;To chair/3-in-1;To bed      Ambulation/Gait   Ambulation/Gait Yes    Ambulation/Gait Assistance 4: Min guard;5: Supervision    Ambulation/Gait Assistance Details Cues provided with gait for upright posture and to look ahead (not down at feet)    Ambulation Distance (Feet) 115 Feet   200   Assistive device Rolling walker    Gait Pattern Decreased stride length;Decreased trunk rotation;Trunk flexed;Wide base of support;Poor foot clearance - left;Poor foot clearance - right    Ambulation Surface Level;Indoor    Stairs Yes    Stairs Assistance 4: Min assist    Stairs Assistance Details (indicate cue type and reason) Pt ascends and descends steps with RLE leading.  With ascending steps, after 2nd step, pt has increased jerking causing decreased foot clearance.  With pause to breathe and relax, with cues for increased intensity for foot clearance, pt able to complete stair negotiation.  With descending steps, pt has significant difficulty clearing LLE to descend.  PT provides cues to "Kick" foot to clear step for descending and pt able to perform better.  2nd trial improved with less jerking noted.    Stair Management Technique Two rails;Forwards;Step to pattern    Number of Stairs 4   x 2   Height of Stairs 6    Pre-Gait Activities Educated on ways to manage the jerking motions in BLEs, including standing lateral weightshifting (pt performs at walker) and  marching in place (pt verbalizes he already does this.    Gait Comments With second bout of gait, pt begins to have myoclonic jerking after 10-20 ft.  He is able to stop, relax/breath, to reset and start gait again without need to sit.      Neuro Re-ed    Neuro Re-ed Details  Standing wide BOS lateral weigthshifting x 15 reps at walker, for ways to manage/lessen tremors thorugh BLEs with gait and standing.  Stride stance weigthshifting/anterior posterior direction x 10 reps each foot position.  Progressed to standing at counter with wide BOS anterior/posterior rocking for improved mobility/stretch through hips, low back/gastrocs and for relaxation to lessen myoclonic jerks.  Lateral weightshifting/rocking with leg lifts (similar to hamstring curls, just with focus on weigthshift,   then lift)      Knee/Hip Exercises: Stretches   Active Hamstring Stretch Right;Left;2 reps;30 seconds    Active Hamstring Stretch Limitations Cues for posture and technique          Standing partial tandem stance, 15 seconds each foot position no UE support  (see also pre-gait activities) Standing at walker, with partial tandem stance, forward/back rocking x 10 reps (feet stay flat, but helps pt to improve motion through hips)        PT Education - 02/01/20 1131    Education Details progress towards goals, tips for improved stair negotiation; additions to HEP    Person(s) Educated Patient    Methods Explanation;Demonstration;Handout    Comprehension Verbalized understanding;Returned demonstration            PT Short Term Goals - 02/01/20 0853      PT SHORT TERM GOAL #1   Title Patient will be able to ambulate 50 feet with RW with SBA to improve home ambulation    Baseline <20 feet with RW min A; 115 ft with supervision/min guard using RW 02/01/2020    Time 4    Period Weeks    Status Achieved    Target Date 02/01/20      PT SHORT TERM GOAL #2   Title Patient will be able to maintain partial tandem  stance for 15 seconds to improve static balance    Baseline able to perform 15 seconds, each foot position no UE support 02/01/2020    Time 4    Period Weeks    Status Achieved    Target Date 02/01/20      PT SHORT TERM GOAL #3   Title Patient will be able to go up and don 8 steps with use of one rail with CGA to improve ability to go up and down the stairs at home.    Baseline bilateral rails min assist, difficulty with foot clearance in clinic, 8 steps; at home, still scooting down steps    Time 4    Period Weeks    Status Not Met    Target Date 02/01/20      PT SHORT TERM GOAL #4   Title Patient will only use RW for indoor mobility at home (no wheelchair use) to improve functional mobility    Baseline only uses walker at home with someone in the home; only uses w/c when no one home.    Time 4    Period Weeks    Status Partially Met    Target Date 02/01/20             PT Long Term Goals - 01/04/20 1829      PT LONG TERM GOAL #1   Title Patient will be able to ambulate >1000 feet with RW to improve community ambulation    Baseline 20 feet with RW    Time 8    Period Weeks    Status New    Target Date 02/29/20      PT LONG TERM GOAL #2   Title Patient will be able to perform TUG <25 seconds with RW to improve overall functional mobility    Baseline 36 seconds with RW    Time 8    Period Weeks    Status New    Target Date 02/29/20      PT LONG TERM GOAL #3   Title Patient will be able to go up and down 17 steps with use of one rail   with sBA to improve ability to go up and down steps    Baseline min A going up and scoots down on his butt    Time 8    Period Weeks    Status New    Target Date 02/29/20      PT LONG TERM GOAL #4   Title Patient will demo 9 points improvement on Berg balance scale to improve functional balance and reduce fall risk    Baseline 30/56    Time 8    Period Weeks    Status New    Target Date 02/29/20                 Plan -  02/01/20 1135    Clinical Impression Statement Assessed STGs this visit, with pt meeting 2 of 4 STGs.  STG met for gait distance/supervision; STG 2 met for improved static balance.  STG 3 not met for steps, as pt requires min assist and cues for improved foot clearance.  (At home, he continues to bump down steps due to safety concerns, fear).  STG 4 partially met, with pt verbalizing not using w/c when family at home; however, when family in the home, he does only use walker.  Pt remains motivated for therapy.  He continues to have myoclonic jerks in BLEs with some gait and standing activites and stairs; educated and practice rocking/weigthshifting in varied poistions today (added to HEP) as ways to manage and less these uncontrolled movements for improved overall ease of mobility.  Pt will continue to benefit from skilled PT to work towards [place] for improved balance, strength, functional mobility.    Personal Factors and Comorbidities Comorbidity 3+;Past/Current Experience;Time since onset of injury/illness/exacerbation;Transportation;Fitness;Age    Comorbidities CHF, ESRD, HTN    Examination-Activity Limitations Bathing;Bed Mobility;Caring for Others;Carry;Dressing;Hygiene/Grooming;Lift;Locomotion Level;Self Feeding;Sit;Squat;Stairs;Stand;Toileting;Transfers    Examination-Participation Restrictions Cleaning;Community Activity;Driving;Interpersonal Relationship;Laundry;Medication Management;Meal Prep;Shop;Yard Work    Stability/Clinical Decision Making Evolving/Moderate complexity    Rehab Potential Good    PT Frequency 2x / week    PT Duration 8 weeks    PT Treatment/Interventions ADLs/Self Care Home Management;Gait training;Stair training;Functional mobility training;Therapeutic activities;Therapeutic exercise;Balance training;Neuromuscular re-education;Patient/family education;Manual techniques;Passive range of motion;Joint Manipulations    PT Next Visit Plan Stair negotiation practice (he does well  with cues descending to "kick" foot to clear step); step ups/step downs as exercises;  review additions to HEP; continue to work on balance, improved gait distance, foot clearance/heelstrike    PT Home Exercise Plan Start walking with family besides him and following behind with wheelchair. Start with 2-5 min; [employer] [id number]    Consulted and Agree with Plan of Care Patient           Patient will benefit from skilled therapeutic intervention in order to improve the following deficits and impairments:  Abnormal gait, Decreased activity tolerance, Decreased balance, Decreased mobility, Decreased endurance, Decreased coordination, Decreased range of motion, Decreased safety awareness, Decreased strength, Difficulty walking, Impaired flexibility, Postural dysfunction, Impaired UE functional use, Impaired tone  Visit Diagnosis: Unsteadiness on feet  Muscle weakness (generalized)     Problem List Patient Active Problem List   Diagnosis Date Noted  . 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 Hypoxic brain injury ([place]) 08/12/2019  . Gait abnormality 08/12/2019  . Coagulase negative Staphylococcus bacteremia 06/30/2019  . History of gout 06/17/2019  . 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 Pain aggravated by sitting 06/09/2019  . Bowel incontinence 06/09/2019  . Protein-calorie malnutrition ([place]) 06/01/2019  . Weakness acquired in ICU   .  Anoxic brain injury ([facility])   . Physical deconditioning   . History of cardiac arrest 04/27/2019  . History of non-ST elevation myocardial infarction (NSTEMI) 04/18/2019  . Pulmonary hypertension ([facility]) 04/14/2019  . Morbid obesity ([facility]) 04/14/2019  . Diabetic neuropathy ([facility]) 04/02/2016  . Hypertension associated with diabetes ([facility]) 10/03/2015  . HLD (hyperlipidemia) 09/06/2015  . Stage 5 chronic kidney disease on chronic dialysis ([facility]) 08/31/2015  . DM (diabetes mellitus), type 2 with renal complications ([facility]) 08/31/2015  . OSA treated with BiPAP 12/12/2013  .  Coronary atherosclerosis 11/01/2009    MARRIOTT,AMY W. 02/01/2020, 11:42 AM  MARRIOTT,AMY W., PT   [facility] [address] [place], [place], [address] Phone: [phone]   Fax:  [phone]  Name: Mattox Gaugh MRN: [id number] Date of Birth: 06/21/1965   

## 2020-02-02 DIAGNOSIS — Z992 Dependence on renal dialysis: Secondary | ICD-10-CM | POA: Diagnosis not present

## 2020-02-02 DIAGNOSIS — N186 End stage renal disease: Secondary | ICD-10-CM | POA: Diagnosis not present

## 2020-02-02 DIAGNOSIS — D631 Anemia in chronic kidney disease: Secondary | ICD-10-CM | POA: Diagnosis not present

## 2020-02-02 DIAGNOSIS — E1121 Type 2 diabetes mellitus with diabetic nephropathy: Secondary | ICD-10-CM | POA: Diagnosis not present

## 2020-02-02 DIAGNOSIS — N2581 Secondary hyperparathyroidism of renal origin: Secondary | ICD-10-CM | POA: Diagnosis not present

## 2020-02-03 ENCOUNTER — Ambulatory Visit: Payer: Medicare Other | Admitting: Physical Therapy

## 2020-02-03 ENCOUNTER — Other Ambulatory Visit: Payer: Self-pay

## 2020-02-03 ENCOUNTER — Encounter: Payer: Self-pay | Admitting: Physical Therapy

## 2020-02-03 DIAGNOSIS — M6281 Muscle weakness (generalized): Secondary | ICD-10-CM

## 2020-02-03 DIAGNOSIS — R2681 Unsteadiness on feet: Secondary | ICD-10-CM

## 2020-02-03 NOTE — Therapy (Addendum)
[facility] [address] [place], [place], [address] Phone: [phone]   Fax:  [phone]  Physical Therapy Treatment  Patient Details  Name: Troy Wells MRN: [id number] Date of Birth: 06-09-1966 Referring Provider (PT): Dr. Andrena Mews   Encounter Date: 02/03/2020   PT End of Session - 02/03/20 0851    Visit Number 9    Number of Visits 17    Date for PT Re-Evaluation 02/29/20    Authorization Type Medicare    Progress Note Due on Visit 10    PT Start Time 0848    PT Stop Time 0929    PT Time Calculation (min) 41 min    Equipment Utilized During Treatment Gait belt    Activity Tolerance Patient tolerated treatment well;No increased pain   Tremors in legs with fatigue   Behavior During Therapy [facility] for tasks assessed/performed           Past Medical History:  Diagnosis Date  . CARDIAC ARREST 11/01/2009   Qualifier: Diagnosis of  By: Selena Batten CMA, Jewel    . Colon polyps 01/02/2016   Colonoscopy July 2017 - One 3 mm polyp in the transverse colon, removed with a cold biopsy forceps. Resected and retrieved. - One 3 mm polyp in the rectum, removed with a cold biopsy forceps. Resected and retrieved. - Diverticulosis in the entire examined colon. - Non-bleeding internal hemorrhoids. - The examination was otherwise normal. - Significant looping which prolonged cecal    . Coronary artery disease 08/15/2009   with AMI  . Coronary atherosclerosis 11/01/2009   Qualifier: Diagnosis of  By: Selena Batten CMA, Jewel    . Dialysis patient ([place]) 09/27/2014   mon, wed, and fri  . Diverticulosis of colon without hemorrhage 01/02/2016   Colonoscopy July 2017 - One 3 mm polyp in the transverse colon, removed with a cold biopsy forceps. Resected and retrieved. - One 3 mm polyp in the rectum, removed with a cold biopsy forceps. Resected and retrieved. - Diverticulosis in the entire examined colon. - Non-bleeding internal  hemorrhoids. - The examination was otherwise normal. - Significant looping which prolonged cecal    . DM (diabetes mellitus), type 2 with renal complications ([place]) 15/52/0802  . Essential hypertension 11/01/2009   Qualifier: Diagnosis of  By: Selena Batten CMA, Jewel    . Gout 10/21/2018  . History of acute respiratory distress syndrome (ARDS) due to COVID-19 virus   . History of cardiac arrest 04/27/2019  . History of non-ST elevation myocardial infarction (NSTEMI) 04/18/2019  . history of respiratory arrest   . Hyperlipidemia   . Hypertension   . Mitral valve regurgitation 09/19/2015   Echo 09/2015   . 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 OSA treated with BiPAP 06/17/2010  . Sleep apnea    cpap nightly    Past Surgical History:  Procedure Laterality Date  . AV FISTULA PLACEMENT Left 09/27/2014  . INSERTION OF ARTERIOVENOUS (AV) ARTEGRAFT ARM Left 04/02/2018   Procedure: INSERTION OF ARTERIOVENOUS (AV) ARTEGRAFT ARM LEFT UPPER ARM;  Surgeon: Waynetta Sandy, MD;  Location: [facility];  Service: Vascular;  Laterality: Left;  . INSERTION OF DIALYSIS CATHETER N/A 04/02/2018   Procedure: INSERTION OF DIALYSIS CATHETER, right internal jugular;  Surgeon: Waynetta Sandy, MD;  Location: [facility];  Service: Vascular;  Laterality: N/A;  . NECK SURGERY     C6 & C7 replaced 30 yrs ago per pt  . REVISON OF ARTERIOVENOUS FISTULA Left 04/02/2018   Procedure: REVISION PLICATION OF  ARTERIOVENOUS FISTULA ARM;  Surgeon: Waynetta Sandy, MD;  Location: [facility];  Service: Vascular;  Laterality: Left;  . WISDOM TOOTH EXTRACTION      There were no vitals filed for this visit.   Subjective Assessment - 02/03/20 0850    Subjective No new complaints. No falls or pain to report. Reports his HEP is getting easier to do.    Pertinent History CHF, HTN, ERSD    Limitations Standing;Walking;Writing;House hold activities;Lifting    How long can you sit comfortably? supported sitting no problem    How long  can you stand comfortably? <5 min    How long can you walk comfortably? 20 feet with walker    Patient Stated Goals improve walking get stronger    Currently in Pain? No/denies                [facility] Adult PT Treatment/Exercise - 02/03/20 0911      Transfers   Transfers Sit to Stand;Stand to Sit    Sit to Stand 5: Supervision;With upper extremity assist;From chair/3-in-1;From bed    Stand to Sit 5: Supervision;With upper extremity assist;To chair/3-in-1;To bed      Ambulation/Gait   Ambulation/Gait Yes    Ambulation/Gait Assistance 4: Min guard    Ambulation/Gait Assistance Details cues for posture, increased step length and walker position with gait.     Ambulation Distance (Feet) 60 Feet   x2   Assistive device Rolling walker    Gait Pattern Decreased stride length;Decreased trunk rotation;Trunk flexed;Wide base of support;Poor foot clearance - left;Poor foot clearance - right    Ambulation Surface Level;Indoor      Exercises   Exercises Other Exercises    Other Exercises  reviewed and advanced HEP for strengthening and balance today. Refer to [place] for full details.           Issued to HEP today:  Access Code: [email or web address] URL: [email or web address] Date: 02/03/2020 Prepared by: Willow Ora  Exercises Side to side weightshift - 1 x daily - 5 x weekly - 1-2 sets - 10 reps Staggered Stance Forward Backward Weight Shift with Counter Support - 1 x daily - 5 x weekly - 1-2 sets - 10 reps Sit to Stand - 1 x daily - 7 x weekly - 2 sets - 10 reps Side Stepping with Counter Support - 1 x daily - 7 x weekly - 2 sets Standing Hip Abduction with Resistance at Ankles and Counter Support - 1 x daily - 5 x weekly - 1 sets - 5-10 reps Standing Hip Extension with Resistance at Ankles and Counter Support - 1 x daily - 5 x weekly - 1 sets - 5-10 reps Heel Toe Raises with Counter Support - 1 x daily - 5 x weekly - 1 sets - 10 reps - 5 hold Standing Single Leg Stance with  Counter Support - 1 x daily - 5 x weekly - 1 sets - 3 reps - 10 hold        PT Education - 02/03/20 1515    Education Details reviewed and advanced HEP today    Person(s) Educated Patient    Methods Explanation;Demonstration;Verbal cues;Handout    Comprehension Verbalized understanding;Returned demonstration;Verbal cues required;Need further instruction            PT Short Term Goals - 02/01/20 0853      PT SHORT TERM GOAL #1   Title Patient will be able to ambulate 50 feet with RW with SBA to improve home ambulation  Baseline <20 feet with RW min A; 115 ft with supervision/min guard using RW 02/01/2020    Time 4    Period Weeks    Status Achieved    Target Date 02/01/20      PT SHORT TERM GOAL #2   Title Patient will be able to maintain partial tandem stance for 15 seconds to improve static balance    Baseline able to perform 15 seconds, each foot position no UE support 02/01/2020    Time 4    Period Weeks    Status Achieved    Target Date 02/01/20      PT SHORT TERM GOAL #3   Title Patient will be able to go up and don 8 steps with use of one rail with CGA to improve ability to go up and down the stairs at home.    Baseline bilateral rails min assist, difficulty with foot clearance in clinic, 8 steps; at home, still scooting down steps    Time 4    Period Weeks    Status Not Met    Target Date 02/01/20      PT SHORT TERM GOAL #4   Title Patient will only use RW for indoor mobility at home (no wheelchair use) to improve functional mobility    Baseline only uses walker at home with someone in the home; only uses w/c when no one home.    Time 4    Period Weeks    Status Partially Met    Target Date 02/01/20             PT Long Term Goals - 01/04/20 1829      PT LONG TERM GOAL #1   Title Patient will be able to ambulate >1000 feet with RW to improve community ambulation    Baseline 20 feet with RW    Time 8    Period Weeks    Status New    Target Date  02/29/20      PT LONG TERM GOAL #2   Title Patient will be able to perform TUG <25 seconds with RW to improve overall functional mobility    Baseline 36 seconds with RW    Time 8    Period Weeks    Status New    Target Date 02/29/20      PT LONG TERM GOAL #3   Title Patient will be able to go up and down 17 steps with use of one rail with sBA to improve ability to go up and down steps    Baseline min A going up and scoots down on his butt    Time 8    Period Weeks    Status New    Target Date 02/29/20      PT LONG TERM GOAL #4   Title Patient will demo 9 points improvement on Berg balance scale to improve functional balance and reduce fall risk    Baseline 30/56    Time 8    Period Weeks    Status New    Target Date 02/29/20              02/03/20 0851  Plan  Clinical Impression Statement Today's skilled session focused on gait with RW for short distances and advancement of pt's HEP. Rest breaks taken as needed. No other issues noted or reported in session. The pt is progressing toward goals and should benefit from continued PT to progress toward unmet goals.    Plan -  02/03/20 0851    Personal Factors and Comorbidities Comorbidity 3+;Past/Current Experience;Time since onset of injury/illness/exacerbation;Transportation;Fitness;Age    Comorbidities CHF, ESRD, HTN    Examination-Activity Limitations Bathing;Bed Mobility;Caring for Others;Carry;Dressing;Hygiene/Grooming;Lift;Locomotion Level;Self Feeding;Sit;Squat;Stairs;Stand;Toileting;Transfers    Examination-Participation Restrictions Cleaning;Community Activity;Driving;Interpersonal Relationship;Laundry;Medication Management;Meal Prep;Shop;Yard Work    Stability/Clinical Decision Making Evolving/Moderate complexity    Rehab Potential Good    PT Frequency 2x / week    PT Duration 8 weeks    PT Treatment/Interventions ADLs/Self Care Home Management;Gait training;Stair training;Functional mobility training;Therapeutic  activities;Therapeutic exercise;Balance training;Neuromuscular re-education;Patient/family education;Manual techniques;Passive range of motion;Joint Manipulations    PT Next Visit Plan Stair negotiation practice (he does well with cues descending to "kick" foot to clear step); step ups/step downs as exercises;  review additions to HEP; continue to work on balance, improved gait distance, foot clearance/heelstrike    PT Home [place] and Agree with Plan of Care Patient           Patient will benefit from skilled therapeutic intervention in order to improve the following deficits and impairments:  Abnormal gait, Decreased activity tolerance, Decreased balance, Decreased mobility, Decreased endurance, Decreased coordination, Decreased range of motion, Decreased safety awareness, Decreased strength, Difficulty walking, Impaired flexibility, Postural dysfunction, Impaired UE functional use, Impaired tone  Visit Diagnosis: Unsteadiness on feet  Muscle weakness (generalized)     Problem List Patient Active Problem List   Diagnosis Date Noted  . 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 Hypoxic brain injury ([place]) 08/12/2019  . Gait abnormality 08/12/2019  . Coagulase negative Staphylococcus bacteremia 06/30/2019  . History of gout 06/17/2019  . 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 Pain aggravated by sitting 06/09/2019  . Bowel incontinence 06/09/2019  . Protein-calorie malnutrition ([place]) 06/01/2019  . Weakness acquired in ICU   . Anoxic brain injury ([place])   . Physical deconditioning   . History of cardiac arrest 04/27/2019  . History of non-ST elevation myocardial infarction (NSTEMI) 04/18/2019  . Pulmonary hypertension ([place]) 04/14/2019  . Morbid obesity ([place]) 04/14/2019  . Diabetic neuropathy ([place]) 04/02/2016  . Hypertension associated with diabetes ([place]) 10/03/2015  . HLD (hyperlipidemia) 09/06/2015  . Stage 5 chronic kidney disease on chronic dialysis ([place])  08/31/2015  . DM (diabetes mellitus), type 2 with renal complications ([place]) 30/14/1597  . OSA treated with BiPAP 12/12/2013  . Coronary atherosclerosis 11/01/2009    Willow Ora 02/03/2020, 3:15 PM  [facility] [address] [place], [place], [address] Phone: [phone]   Fax:  [phone]  Name: Tedric Leeth MRN: [id number] Date of Birth: [birth year]

## 2020-02-03 NOTE — Patient Instructions (Signed)
Access Code: [email or web address] URL: [email or web address] Date: 02/03/2020 Prepared by: Willow Ora  Exercises Side to side weightshift - 1 x daily - 5 x weekly - 1-2 sets - 10 reps Staggered Stance Forward Backward Weight Shift with Counter Support - 1 x daily - 5 x weekly - 1-2 sets - 10 reps Sit to Stand - 1 x daily - 7 x weekly - 2 sets - 10 reps Side Stepping with Counter Support - 1 x daily - 7 x weekly - 2 sets Standing Hip Abduction with Resistance at Ankles and Counter Support - 1 x daily - 5 x weekly - 1 sets - 5-10 reps Standing Hip Extension with Resistance at Ankles and Counter Support - 1 x daily - 5 x weekly - 1 sets - 5-10 reps Heel Toe Raises with Counter Support - 1 x daily - 5 x weekly - 1 sets - 10 reps - 5 hold Standing Single Leg Stance with Counter Support - 1 x daily - 5 x weekly - 1 sets - 3 reps - 10 hold

## 2020-02-04 DIAGNOSIS — N186 End stage renal disease: Secondary | ICD-10-CM | POA: Diagnosis not present

## 2020-02-04 DIAGNOSIS — N2581 Secondary hyperparathyroidism of renal origin: Secondary | ICD-10-CM | POA: Diagnosis not present

## 2020-02-04 DIAGNOSIS — D631 Anemia in chronic kidney disease: Secondary | ICD-10-CM | POA: Diagnosis not present

## 2020-02-04 DIAGNOSIS — E1121 Type 2 diabetes mellitus with diabetic nephropathy: Secondary | ICD-10-CM | POA: Diagnosis not present

## 2020-02-04 DIAGNOSIS — Z992 Dependence on renal dialysis: Secondary | ICD-10-CM | POA: Diagnosis not present

## 2020-02-07 ENCOUNTER — Telehealth: Payer: Self-pay | Admitting: Family Medicine

## 2020-02-07 ENCOUNTER — Other Ambulatory Visit: Payer: Self-pay | Admitting: Family Medicine

## 2020-02-07 DIAGNOSIS — I152 Hypertension secondary to endocrine disorders: Secondary | ICD-10-CM

## 2020-02-07 DIAGNOSIS — E1121 Type 2 diabetes mellitus with diabetic nephropathy: Secondary | ICD-10-CM | POA: Diagnosis not present

## 2020-02-07 DIAGNOSIS — N186 End stage renal disease: Secondary | ICD-10-CM | POA: Diagnosis not present

## 2020-02-07 DIAGNOSIS — Z8739 Personal history of other diseases of the musculoskeletal system and connective tissue: Secondary | ICD-10-CM

## 2020-02-07 DIAGNOSIS — N2581 Secondary hyperparathyroidism of renal origin: Secondary | ICD-10-CM | POA: Diagnosis not present

## 2020-02-07 DIAGNOSIS — D631 Anemia in chronic kidney disease: Secondary | ICD-10-CM | POA: Diagnosis not present

## 2020-02-07 DIAGNOSIS — Z992 Dependence on renal dialysis: Secondary | ICD-10-CM | POA: Diagnosis not present

## 2020-02-07 NOTE — Telephone Encounter (Signed)
I was unable to reach patient by phone. However, I spoke with his daughter regarding his medication refill request.  Given his HD status, I will cut back on his Allopurinol to 100 mg qd from 200 mg qd. Recheck Urine acid level in the future, if elevated, may consider increasing the dose back.  I also advised her that I have not seen Troy Wells in over a year, although he was seen by a different provider in Feb of 2021. Appointment recommended which I made for him today.  All questions were answered and she agreed with the plan.

## 2020-02-08 ENCOUNTER — Ambulatory Visit: Payer: Medicare Other

## 2020-02-08 ENCOUNTER — Other Ambulatory Visit: Payer: Self-pay

## 2020-02-08 DIAGNOSIS — M6281 Muscle weakness (generalized): Secondary | ICD-10-CM | POA: Diagnosis not present

## 2020-02-08 DIAGNOSIS — R2681 Unsteadiness on feet: Secondary | ICD-10-CM | POA: Diagnosis not present

## 2020-02-08 NOTE — Therapy (Signed)
[facility] [address] [place], [place], [address] Phone: [phone]   Fax:  [phone]  Physical Therapy Progress Note  Patient Details  Name: Troy Wells MRN: [id number] Date of Birth: 02-18-1966 Referring Provider (PT): Dr. Andrena Mews   Encounter Date: 02/08/2020   PT End of Session - 02/08/20 0901    Visit Number 10    Number of Visits 17    Date for PT Re-Evaluation 02/29/20    Authorization Type Medicare, PN 02/08/20    Authorization - Visit Number 10    Progress Note Due on Visit 17    PT Start Time 0850    PT Stop Time 0930    PT Time Calculation (min) 40 min    Equipment Utilized During Treatment Gait belt    Activity Tolerance Patient tolerated treatment well;No increased pain   Tremors in legs with fatigue   Behavior During Therapy [facility] for tasks assessed/performed           Past Medical History:  Diagnosis Date  . CARDIAC ARREST 11/01/2009   Qualifier: Diagnosis of  By: Selena Batten CMA, Jewel    . Colon polyps 01/02/2016   Colonoscopy July 2017 - One 3 mm polyp in the transverse colon, removed with a cold biopsy forceps. Resected and retrieved. - One 3 mm polyp in the rectum, removed with a cold biopsy forceps. Resected and retrieved. - Diverticulosis in the entire examined colon. - Non-bleeding internal hemorrhoids. - The examination was otherwise normal. - Significant looping which prolonged cecal    . Coronary artery disease 08/15/2009   with AMI  . Coronary atherosclerosis 11/01/2009   Qualifier: Diagnosis of  By: Selena Batten CMA, Jewel    . Dialysis patient ([place]) 09/27/2014   mon, wed, and fri  . Diverticulosis of colon without hemorrhage 01/02/2016   Colonoscopy July 2017 - One 3 mm polyp in the transverse colon, removed with a cold biopsy forceps. Resected and retrieved. - One 3 mm polyp in the rectum, removed with a cold biopsy forceps. Resected and retrieved. - Diverticulosis in  the entire examined colon. - Non-bleeding internal hemorrhoids. - The examination was otherwise normal. - Significant looping which prolonged cecal    . DM (diabetes mellitus), type 2 with renal complications ([place]) 50/08/7046  . Essential hypertension 11/01/2009   Qualifier: Diagnosis of  By: Selena Batten CMA, Jewel    . Gout 10/21/2018  . History of acute respiratory distress syndrome (ARDS) due to COVID-19 virus   . History of cardiac arrest 04/27/2019  . History of non-ST elevation myocardial infarction (NSTEMI) 04/18/2019  . history of respiratory arrest   . Hyperlipidemia   . Hypertension   . Mitral valve regurgitation 09/19/2015   Echo 09/2015   . 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 OSA treated with BiPAP 06/17/2010  . Sleep apnea    cpap nightly    Past Surgical History:  Procedure Laterality Date  . AV FISTULA PLACEMENT Left 09/27/2014  . INSERTION OF ARTERIOVENOUS (AV) ARTEGRAFT ARM Left 04/02/2018   Procedure: INSERTION OF ARTERIOVENOUS (AV) ARTEGRAFT ARM LEFT UPPER ARM;  Surgeon: Waynetta Sandy, MD;  Location: [facility];  Service: Vascular;  Laterality: Left;  . INSERTION OF DIALYSIS CATHETER N/A 04/02/2018   Procedure: INSERTION OF DIALYSIS CATHETER, right internal jugular;  Surgeon: Waynetta Sandy, MD;  Location: [facility];  Service: Vascular;  Laterality: N/A;  . NECK SURGERY     C6 & C7 replaced 30 yrs ago per pt  . REVISON  OF ARTERIOVENOUS FISTULA Left 04/02/2018   Procedure: REVISION PLICATION OF ARTERIOVENOUS FISTULA ARM;  Surgeon: Waynetta Sandy, MD;  Location: [facility];  Service: Vascular;  Laterality: Left;  . WISDOM TOOTH EXTRACTION      There were no vitals filed for this visit.   Subjective Assessment - 02/08/20 0854    Subjective I am doing okay. No new complaints. I am walking with walker better since start of PT. I am not using wheelchair at home.    Pertinent History CHF, HTN, ERSD    Limitations Standing;Walking;Writing;House hold  activities;Lifting    How long can you sit comfortably? supported sitting no problem    How long can you stand comfortably? <5 min    How long can you walk comfortably? 20 feet with walker    Patient Stated Goals improve walking get stronger              [facility] PT Assessment - 02/08/20 0001      Timed Up and Go Test   Normal TUG (seconds) 28   RW            Treatment Nustep: level 4 UE and LE: 5' Standing hip flexion, abduction, extension: yellow band around ankles: 2 x 10 R and L  Seated LAQ: yellow band around ankles: 3 x 10 Standing unilateral stance with bil LE support to work on increasing tolerance with increased WB on single LE: 3 x 30" R and L bil HHA  Walking fwd and bwd with RW: 10 feet x 3 Walking figure 8, 3 cones: 2x  Sit to stand from standard chair + 2 airex cushions and 5lb KB: no HHA: 10x                     PT Short Term Goals - 02/08/20 0855      PT SHORT TERM GOAL #1   Title Patient will be able to ambulate 50 feet with RW with SBA to improve home ambulation    Baseline <20 feet with RW min A; 115 ft with supervision/min guard using RW 02/01/2020    Time 4    Period Weeks    Status Achieved    Target Date 02/01/20      PT SHORT TERM GOAL #2   Title Patient will be able to maintain partial tandem stance for 15 seconds to improve static balance    Baseline able to perform 15 seconds, each foot position no UE support 02/01/2020    Time 4    Period Weeks    Status Achieved    Target Date 02/01/20      PT SHORT TERM GOAL #3   Title Patient will be able to go up and don 8 steps with use of one rail with CGA to improve ability to go up and down the stairs at home.    Baseline bilateral rails min assist, difficulty with foot clearance in clinic, 8 steps; at home, still scooting down steps    Time 4    Period Weeks    Status Not Met    Target Date 02/01/20      PT SHORT TERM GOAL #4   Title Patient will only use RW for indoor  mobility at home (no wheelchair use) to improve functional mobility    Baseline only uses walker at home with someone in the home; only uses w/c when no one home.    Time 4    Period Weeks  Status Achieved    Target Date 02/01/20             PT Long Term Goals - 01/04/20 1829      PT LONG TERM GOAL #1   Title Patient will be able to ambulate >1000 feet with RW to improve community ambulation    Baseline 20 feet with RW    Time 8    Period Weeks    Status New    Target Date 02/29/20      PT LONG TERM GOAL #2   Title Patient will be able to perform TUG <25 seconds with RW to improve overall functional mobility    Baseline 36 seconds with RW    Time 8    Period Weeks    Status New    Target Date 02/29/20      PT LONG TERM GOAL #3   Title Patient will be able to go up and down 17 steps with use of one rail with sBA to improve ability to go up and down steps    Baseline min A going up and scoots down on his butt    Time 8    Period Weeks    Status New    Target Date 02/29/20      PT LONG TERM GOAL #4   Title Patient will demo 9 points improvement on Berg balance scale to improve functional balance and reduce fall risk    Baseline 30/56    Time 8    Period Weeks    Status New    Target Date 02/29/20                 Plan - 02/08/20 0900    Clinical Impression Statement Patient has been seen for total of 10 sessions from 01/04/20 to 02/08/20. Patient has made a significant progress in physical therapy. Patient met short term goal #4 today. Patient has met 3/4 short term goals so far. Patient made significan timprovement in timed up and go test from 36 seconds to 28 seconds with rolling walker. Patient will continue to benefit from skilled PT to continue to work towards his long term functional gaols.    Personal Factors and Comorbidities Comorbidity 3+;Past/Current Experience;Time since onset of injury/illness/exacerbation;Transportation;Fitness;Age    Comorbidities  CHF, ESRD, HTN    Examination-Activity Limitations Bathing;Bed Mobility;Caring for Others;Carry;Dressing;Hygiene/Grooming;Lift;Locomotion Level;Self Feeding;Sit;Squat;Stairs;Stand;Toileting;Transfers    Examination-Participation Restrictions Cleaning;Community Activity;Driving;Interpersonal Relationship;Laundry;Medication Management;Meal Prep;Shop;Yard Work    Stability/Clinical Decision Making Evolving/Moderate complexity    Rehab Potential Good    PT Frequency 2x / week    PT Duration 8 weeks    PT Treatment/Interventions ADLs/Self Care Home Management;Gait training;Stair training;Functional mobility training;Therapeutic activities;Therapeutic exercise;Balance training;Neuromuscular re-education;Patient/family education;Manual techniques;Passive range of motion;Joint Manipulations    PT Next Visit Plan Assess Berg balance scale; Stair negotiation practice (he does well with cues descending to "kick" foot to clear step); step ups/step downs as exercises;  review additions to HEP; continue to work on balance, improved gait distance, foot clearance/heelstrike    PT Home Exercise Plan [employer] [email or web address]    Consulted and Agree with Plan of Care Patient           Patient will benefit from skilled therapeutic intervention in order to improve the following deficits and impairments:  Abnormal gait, Decreased activity tolerance, Decreased balance, Decreased mobility, Decreased endurance, Decreased coordination, Decreased range of motion, Decreased safety awareness, Decreased strength, Difficulty walking, Impaired flexibility, Postural dysfunction, Impaired UE functional use, Impaired tone  Visit Diagnosis:  Muscle weakness (generalized)  Unsteadiness on feet     Problem List Patient Active Problem List   Diagnosis Date Noted  . 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 Hypoxic brain injury ([place]) 08/12/2019  . Gait abnormality 08/12/2019  . Coagulase negative Staphylococcus bacteremia 06/30/2019  .  History of gout 06/17/2019  . 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 Pain aggravated by sitting 06/09/2019  . Bowel incontinence 06/09/2019  . Protein-calorie malnutrition ([place]) 06/01/2019  . Weakness acquired in ICU   . Anoxic brain injury ([name])   . Physical deconditioning   . History of cardiac arrest 04/27/2019  . History of non-ST elevation myocardial infarction (NSTEMI) 04/18/2019  . Pulmonary hypertension ([place]) 04/14/2019  . Morbid obesity ([place]) 04/14/2019  . Diabetic neuropathy ([place]) 04/02/2016  . Hypertension associated with diabetes ([place]) 10/03/2015  . HLD (hyperlipidemia) 09/06/2015  . 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 DM (diabetes mellitus), type 2 with renal complications ([place]) 94/70/7615  . OSA treated with BiPAP 12/12/2013  . Coronary atherosclerosis 11/01/2009    Kerrie Pleasure, PT 02/08/2020, 9:32 AM  [facility] [address] [place], [place], [address] Phone: [phone]   Fax:  [phone]  Name: Dyquan Minks MRN: [id number] Date of Birth: 25-May-1966

## 2020-02-09 DIAGNOSIS — N2581 Secondary hyperparathyroidism of renal origin: Secondary | ICD-10-CM | POA: Diagnosis not present

## 2020-02-09 DIAGNOSIS — E1121 Type 2 diabetes mellitus with diabetic nephropathy: Secondary | ICD-10-CM | POA: Diagnosis not present

## 2020-02-09 DIAGNOSIS — D631 Anemia in chronic kidney disease: Secondary | ICD-10-CM | POA: Diagnosis not present

## 2020-02-09 DIAGNOSIS — Z992 Dependence on renal dialysis: Secondary | ICD-10-CM | POA: Diagnosis not present

## 2020-02-09 DIAGNOSIS — N186 End stage renal disease: Secondary | ICD-10-CM | POA: Diagnosis not present

## 2020-02-10 ENCOUNTER — Other Ambulatory Visit: Payer: Self-pay

## 2020-02-10 ENCOUNTER — Ambulatory Visit: Payer: Medicare Other | Admitting: Physical Therapy

## 2020-02-10 ENCOUNTER — Encounter: Payer: Self-pay | Admitting: Physical Therapy

## 2020-02-10 DIAGNOSIS — R2681 Unsteadiness on feet: Secondary | ICD-10-CM

## 2020-02-10 DIAGNOSIS — M6281 Muscle weakness (generalized): Secondary | ICD-10-CM | POA: Diagnosis not present

## 2020-02-10 NOTE — Therapy (Signed)
[facility] [address] [place], [place], [address] Phone: [phone]   Fax:  [phone]  Physical Therapy Treatment  Patient Details  Name: Troy Wells MRN: [id number] Date of Birth: Dec 05, 1965 Referring Provider (PT): Dr. Andrena Mews   Encounter Date: 02/10/2020   PT End of Session - 02/10/20 0851    Visit Number 11    Number of Visits 17    Date for PT Re-Evaluation 02/29/20    Authorization Type Medicare, PN 02/08/20    Authorization - Visit Number 10    Progress Note Due on Visit 17    PT Start Time 0848    PT Stop Time 0931    PT Time Calculation (min) 43 min    Equipment Utilized During Treatment Gait belt    Activity Tolerance Patient tolerated treatment well;No increased pain   Tremors in legs with fatigue/balance challenge in SLS   Behavior During Therapy [facility] for tasks assessed/performed           Past Medical History:  Diagnosis Date   CARDIAC ARREST 11/01/2009   Qualifier: Diagnosis of  By: Selena Batten CMA, [place]     Colon polyps 01/02/2016   Colonoscopy July 2017 - One 3 mm polyp in the transverse colon, removed with a cold biopsy forceps. Resected and retrieved. - One 3 mm polyp in the rectum, removed with a cold biopsy forceps. Resected and retrieved. - Diverticulosis in the entire examined colon. - Non-bleeding internal hemorrhoids. - The examination was otherwise normal. - Significant looping which prolonged cecal     Coronary artery disease 08/15/2009   with AMI   Coronary atherosclerosis 11/01/2009   Qualifier: Diagnosis of  By: Selena Batten CMA, Jewel     Dialysis patient [facility]) 09/27/2014   mon, wed, and fri   Diverticulosis of colon without hemorrhage 01/02/2016   Colonoscopy July 2017 - One 3 mm polyp in the transverse colon, removed with a cold biopsy forceps. Resected and retrieved. - One 3 mm polyp in the rectum, removed with a cold biopsy forceps. Resected and  retrieved. - Diverticulosis in the entire examined colon. - Non-bleeding internal hemorrhoids. - The examination was otherwise normal. 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   OSA treated with BiPAP 06/17/2010   Sleep apnea    cpap nightly    Past Surgical History:  Procedure Laterality Date   AV FISTULA PLACEMENT Left 09/27/2014   INSERTION OF ARTERIOVENOUS (AV) ARTEGRAFT ARM Left 04/02/2018   Procedure: INSERTION OF ARTERIOVENOUS (AV) ARTEGRAFT ARM LEFT UPPER ARM;  Surgeon: Waynetta Sandy, MD;  Location: [place];  Service: Vascular;  Laterality: Left;   INSERTION OF DIALYSIS CATHETER N/A 04/02/2018   Procedure: INSERTION OF DIALYSIS CATHETER, right internal jugular;  Surgeon: Waynetta Sandy, MD;  Location: [place];  Service: Vascular;  Laterality: N/A;   NECK SURGERY     C6 & C7 replaced 30 yrs ago per pt  REVISON OF ARTERIOVENOUS FISTULA Left 04/02/2018   Procedure: REVISION PLICATION OF ARTERIOVENOUS FISTULA ARM;  Surgeon: Waynetta Sandy, MD;  Location: [place];  Service: Vascular;  Laterality: Left;   WISDOM TOOTH EXTRACTION      There were no vitals filed for this visit.   Subjective Assessment - 02/10/20 0851    Subjective No changes, no falls.    Pertinent History CHF, HTN, ERSD    Limitations Standing;Walking;Writing;House hold activities;Lifting    How long can you sit comfortably? supported sitting no  problem    How long can you stand comfortably? <5 min    How long can you walk comfortably? 20 feet with walker    Patient Stated Goals improve walking get stronger    Currently in Pain? No/denies              [facility] PT Assessment - 02/10/20 0001      Berg Balance Test   Sit to Stand Able to stand  independently using hands    Standing Unsupported Able to stand safely 2 minutes    Sitting with Back Unsupported but Feet Supported on Floor or Stool Able to sit safely and securely 2 minutes    Stand to Sit Controls descent by using hands    Transfers Able to transfer safely, definite need of hands    Standing Unsupported with Eyes Closed Able to stand 10 seconds with supervision    Standing Unsupported with Feet Together Needs help to attain position but able to stand for 30 seconds with feet together    From Standing, Reach Forward with Outstretched Arm Can reach forward >12 cm safely (5")    From Standing Position, Pick up Object from Floor Able to pick up shoe, needs supervision    From Standing Position, Turn to Look Behind Over each Shoulder Looks behind from both sides and weight shifts well    Turn 360 Degrees Able to turn 360 degrees safely but slowly    Standing Unsupported, Alternately Place Feet on Step/Stool Able to complete >2 steps/needs minimal assist    Standing Unsupported, One Foot in Front Able to take small step independently and hold 30 seconds    Standing on One Leg Tries to lift leg/unable to hold 3 seconds but remains standing independently    Total Score 37                         [facility] Adult PT Treatment/Exercise - 02/10/20 0909      Transfers   Transfers Sit to Stand;Stand to Sit    Sit to Stand 5: Supervision;With upper extremity assist;From chair/3-in-1;From bed    Stand to Sit 5: Supervision;With upper extremity assist;To chair/3-in-1;To bed    Comments Performed at least 5 reps throughout session      Ambulation/Gait   Ambulation/Gait  Yes    Ambulation/Gait Assistance 4: Min guard    Ambulation/Gait Assistance Details Short distance gait between activities in gym    Assistive device Rolling walker    Gait Pattern Decreased stride length;Decreased trunk rotation;Trunk flexed;Wide base of support;Poor foot clearance - left;Poor foot clearance - right    Ambulation Surface Level;Indoor    Stairs Yes    Stairs Assistance 4: Min assist    Stair Management Technique Two rails;Forwards;Step to pattern    Number of Stairs 4   x2   Height of Stairs 6      Neuro Re-ed    Neuro Re-ed Details  Standing at RW, wide BOS lateral weightshifting x 10 reps, then stagger stance rocking forward/back x 10 reps each foot position.  (Review of additions from HEP) and educated pt he can use this rocking method to relax as a strategy to reduce myoclonic jerks in standing/gait.               Balance Exercises - 02/10/20 0001      Balance Exercises: Standing   SLS with Vectors Solid surface;Upper extremity assist 2;Other reps (comment)   Alternating step taps, x 10 reps, cues for posture   Standing, One Foot on a Step Eyes open;6 inch;5 reps;Head turns   2 sets head turns; 2 sets x 5 reps alt UE lifts   Step Ups Forward;4 inch;UE support 2;Limitations    Step Ups Limitations In parallel bars, step up/up, down/down x 5 reps, then single step up x 5 reps BUE support.  Cues for posture and avoid pulling up with arms              PT Education - 02/10/20 0942    Education Details Progress on Berg Balance test; still at fall risk and still RW is best/safest option for gait.    Person(s) Educated Patient    Methods Explanation    Comprehension Verbalized understanding            PT Short Term Goals - 02/08/20 0855      PT SHORT TERM GOAL #1   Title Patient will be able to ambulate 50 feet with RW with SBA to improve home ambulation    Baseline <20 feet with RW min A; 115 ft with supervision/min guard using RW 02/01/2020    Time 4      Period Weeks    Status Achieved    Target Date 02/01/20      PT SHORT TERM GOAL #2   Title Patient will be able to maintain partial tandem stance for 15 seconds to improve static balance    Baseline able to perform 15 seconds, each foot position no UE support 02/01/2020    Time 4    Period Weeks    Status Achieved    Target Date 02/01/20      PT SHORT TERM GOAL #3   Title Patient will be able to go up and don 8 steps with use of one rail with CGA to improve ability to go up and down the stairs at home.    Baseline bilateral rails min assist, difficulty with foot clearance in clinic, 8 steps; at home, still scooting down steps    Time 4    Period Weeks    Status Not Met    Target Date 02/01/20      PT SHORT TERM GOAL #4   Title Patient will only use RW for indoor mobility at home (no wheelchair use) to improve functional mobility    Baseline only uses walker at home with someone in the home; only uses w/c when no one home.    Time 4    Period Weeks    Status Achieved    Target Date 02/01/20             PT Long Term Goals - 02/10/20 0948      PT LONG TERM GOAL #1   Title Patient will be able to ambulate >1000 feet with RW to improve community ambulation    Baseline 20 feet with RW    Time 8    Period Weeks  Status On-going      PT LONG TERM GOAL #2   Title Patient will be able to perform TUG <25 seconds with RW to improve overall functional mobility    Baseline 36 seconds with RW    Time 8    Period Weeks    Status On-going      PT LONG TERM GOAL #3   Title Patient will be able to go up and down 17 steps with use of one rail with sBA to improve ability to go up and down steps    Baseline min A going up and scoots down on his butt    Time 8    Period Weeks    Status On-going      PT LONG TERM GOAL #4   Title Patient will demo improvement on Berg balance scale to at least 43/56 to improve functional balance and reduce fall risk    Baseline 30/56; 37/56  02/10/2020    Time 8    Period Weeks    Status Revised                 Plan - 02/10/20 0944    Clinical Impression Statement Assessed Berg Balance test this visit, with pt improving score from 30/56 to 37/56.  Educated pt that this means he is still at risk for falls and RW would be best, safest option for gait.  Addressed additional balance with single limb stance activities as well as stair negotiation.  He has some hesitation with lifting each leg towards 6" step, but improves with repetition and cues for improved posture.  Pt will continue to benefit from skilled PT to address improved strength, balance, and gait towards LTGs.    Personal Factors and Comorbidities Comorbidity 3+;Past/Current Experience;Time since onset of injury/illness/exacerbation;Transportation;Fitness;Age    Comorbidities CHF, ESRD, HTN    Examination-Activity Limitations Bathing;Bed Mobility;Caring for Others;Carry;Dressing;Hygiene/Grooming;Lift;Locomotion Level;Self Feeding;Sit;Squat;Stairs;Stand;Toileting;Transfers    Examination-Participation Restrictions Cleaning;Community Activity;Driving;Interpersonal Relationship;Laundry;Medication Management;Meal Prep;Shop;Yard Work    Stability/Clinical Decision Making Evolving/Moderate complexity    Rehab Potential Good    PT Frequency 2x / week    PT Duration 8 weeks    PT Treatment/Interventions ADLs/Self Care Home Management;Gait training;Stair training;Functional mobility training;Therapeutic activities;Therapeutic exercise;Balance training;Neuromuscular re-education;Patient/family education;Manual techniques;Passive range of motion;Joint Manipulations    PT Next Visit Plan Work on step ups, step taps, SLS activities; continue to work on functional lower extremity strength; gait with attention to posture, foot clearance/heelstrike; stair negotiation practice    PT [place] and Agree with Plan of Care Patient            Patient will benefit from skilled therapeutic intervention in order to improve the following deficits and impairments:  Abnormal gait, Decreased activity tolerance, Decreased balance, Decreased mobility, Decreased endurance, Decreased coordination, Decreased range of motion, Decreased safety awareness, Decreased strength, Difficulty walking, Impaired flexibility, Postural dysfunction, Impaired UE functional use, Impaired tone  Visit Diagnosis: Unsteadiness on feet     Problem List Patient Active Problem List   Diagnosis Date Noted   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)    Physical deconditioning    History  of cardiac arrest 04/27/2019   History of non-ST elevation myocardial infarction (NSTEMI) 04/18/2019   Pulmonary hypertension ([place]) 04/14/2019   Morbid obesity ([place]) 04/14/2019   Diabetic neuropathy ([place]) 04/02/2016   Hypertension associated with diabetes ([place]) 10/03/2015   HLD (hyperlipidemia) 09/06/2015   Stage 5 chronic kidney disease on chronic dialysis ([place]) 08/31/2015   DM (diabetes mellitus), type 2 with renal complications ([place]) 16/03/9603   OSA treated with BiPAP 12/12/2013   Coronary atherosclerosis 11/01/2009    Troy Wells W. 02/10/2020, 9:50 AM  Frazier Butt., PT   [facility] [address] [place], [place], [address] Phone: [phone]   Fax:  [phone]  Name: Troy Wells MRN: [id number] Date of Birth: Jul 25, 1965

## 2020-02-11 DIAGNOSIS — Z992 Dependence on renal dialysis: Secondary | ICD-10-CM | POA: Diagnosis not present

## 2020-02-11 DIAGNOSIS — D631 Anemia in chronic kidney disease: Secondary | ICD-10-CM | POA: Diagnosis not present

## 2020-02-11 DIAGNOSIS — N186 End stage renal disease: Secondary | ICD-10-CM | POA: Diagnosis not present

## 2020-02-11 DIAGNOSIS — N2581 Secondary hyperparathyroidism of renal origin: Secondary | ICD-10-CM | POA: Diagnosis not present

## 2020-02-11 DIAGNOSIS — E1121 Type 2 diabetes mellitus with diabetic nephropathy: Secondary | ICD-10-CM | POA: Diagnosis not present

## 2020-02-14 DIAGNOSIS — Z992 Dependence on renal dialysis: Secondary | ICD-10-CM | POA: Diagnosis not present

## 2020-02-14 DIAGNOSIS — N2581 Secondary hyperparathyroidism of renal origin: Secondary | ICD-10-CM | POA: Diagnosis not present

## 2020-02-14 DIAGNOSIS — E1121 Type 2 diabetes mellitus with diabetic nephropathy: Secondary | ICD-10-CM | POA: Diagnosis not present

## 2020-02-14 DIAGNOSIS — N186 End stage renal disease: Secondary | ICD-10-CM | POA: Diagnosis not present

## 2020-02-14 DIAGNOSIS — D631 Anemia in chronic kidney disease: Secondary | ICD-10-CM | POA: Diagnosis not present

## 2020-02-15 ENCOUNTER — Ambulatory Visit: Payer: Medicare Other

## 2020-02-15 ENCOUNTER — Other Ambulatory Visit: Payer: Self-pay

## 2020-02-15 DIAGNOSIS — R2681 Unsteadiness on feet: Secondary | ICD-10-CM

## 2020-02-15 DIAGNOSIS — M6281 Muscle weakness (generalized): Secondary | ICD-10-CM | POA: Diagnosis not present

## 2020-02-15 NOTE — Therapy (Signed)
[facility] [address] [place], [place], [address] Phone: [phone]   Fax:  [phone]  Physical Therapy Treatment  Patient Details  Name: Troy Wells MRN: [id number] Date of Birth: 1966-02-01 Referring Provider (PT): Dr. Andrena Mews   Encounter Date: 02/15/2020   PT End of Session - 02/15/20 0925    Visit Number 12    Number of Visits 17    Date for PT Re-Evaluation 02/29/20    Authorization Type Medicare, PN 02/08/20    Authorization - Visit Number 11    Progress Note Due on Visit 17    PT Start Time 0820    PT Stop Time 0910    PT Time Calculation (min) 50 min    Equipment Utilized During Treatment Gait belt    Activity Tolerance Patient tolerated treatment well;No increased pain   Tremors in legs with fatigue/balance challenge in SLS   Behavior During Therapy [facility] for tasks assessed/performed           Past Medical History:  Diagnosis Date  . CARDIAC ARREST 11/01/2009   Qualifier: Diagnosis of  By: Selena Batten CMA, [place]    . Colon polyps 01/02/2016   Colonoscopy July 2017 - One 3 mm polyp in the transverse colon, removed with a cold biopsy forceps. Resected and retrieved. - One 3 mm polyp in the rectum, removed with a cold biopsy forceps. Resected and retrieved. - Diverticulosis in the entire examined colon. - Non-bleeding internal hemorrhoids. - The examination was otherwise normal. - Significant looping which prolonged cecal    . Coronary artery disease 08/15/2009   with AMI  . Coronary atherosclerosis 11/01/2009   Qualifier: Diagnosis of  By: Selena Batten CMA, Jewel    . Dialysis patient ([place]) 09/27/2014   mon, wed, and fri  . Diverticulosis of colon without hemorrhage 01/02/2016   Colonoscopy July 2017 - One 3 mm polyp in the transverse colon, removed with a cold biopsy forceps. Resected and retrieved. - One 3 mm polyp in the rectum, removed with a cold biopsy forceps. Resected and  retrieved. - Diverticulosis in the entire examined colon. - Non-bleeding internal hemorrhoids. - The examination was otherwise normal. - Significant looping which prolonged cecal    . DM (diabetes mellitus), type 2 with renal complications ([place]) 27/11/2374  . Essential hypertension 11/01/2009   Qualifier: Diagnosis of  By: Selena Batten CMA, Jewel    . Gout 10/21/2018  . History of acute respiratory distress syndrome (ARDS) due to COVID-19 virus   . History of cardiac arrest 04/27/2019  . History of non-ST elevation myocardial infarction (NSTEMI) 04/18/2019  . history of respiratory arrest   . Hyperlipidemia   . Hypertension   . Mitral valve regurgitation 09/19/2015   Echo 09/2015   . 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 OSA treated with BiPAP 06/17/2010  . Sleep apnea    cpap nightly    Past Surgical History:  Procedure Laterality Date  . AV FISTULA PLACEMENT Left 09/27/2014  . INSERTION OF ARTERIOVENOUS (AV) ARTEGRAFT ARM Left 04/02/2018   Procedure: INSERTION OF ARTERIOVENOUS (AV) ARTEGRAFT ARM LEFT UPPER ARM;  Surgeon: Waynetta Sandy, MD;  Location: [facility];  Service: Vascular;  Laterality: Left;  . INSERTION OF DIALYSIS CATHETER N/A 04/02/2018   Procedure: INSERTION OF DIALYSIS CATHETER, right internal jugular;  Surgeon: Waynetta Sandy, MD;  Location: [facility];  Service: Vascular;  Laterality: N/A;  . NECK SURGERY     C6 & C7 replaced 30 yrs ago per pt  .  REVISON OF ARTERIOVENOUS FISTULA Left 04/02/2018   Procedure: REVISION PLICATION OF ARTERIOVENOUS FISTULA ARM;  Surgeon: Cain, Brandon Christopher, MD;  [facility];  Service: Vascular;  Laterality: Left;  . WISDOM TOOTH EXTRACTION      There were no vitals filed for this visit.   Subjective Assessment - 02/15/20 0826    Subjective Nothing new to report    Pertinent History CHF, HTN, ERSD    Limitations Standing;Walking;Writing;House hold activities;Lifting    How long can you sit comfortably? supported sitting no  problem    How long can you stand comfortably? <5 min    How long can you walk comfortably? 20 feet with walker    Patient Stated Goals improve walking get stronger                    SLS with bil HHA: 3 x 35" R and L Rest Fwd and bwd walking: 5 x 10 feet, 1 HHA Lateral walking: 5 x 10 feet, 1 HHA Rest Standing box taps: 6" box - first attempted with 3lbs on each ankle and 1 HHA: pt demonstrates "freezing" like episodes with intentional tremors: 5x - Attempted without weight on ankles and only 1 HHA: no improvements in intentional tremors: 5x - seated box taps: 6" bo: 20x - bil HHA: pt had difficult time performing it while holding countertop with 1 hand and tray table with second hand - attempted these at the stairs: pt was able to perform 3 reps with reduced tremors but then his tremors got stronger.  Standing with wide BOS on floor: pt holding a playball with bil hands and tracks ball with head as he moves ball up and down and side to side: 10x each  Playing catch with play ball: pt challenged in multiple direction and high and low: 5'  Gait training with quad cane and wheelchair follow: CGA: 1 x 120'                 PT Education - 02/15/20 0924    Education Details Pt educated on using walker only for ambulation at home. Pt was educated to not attempt walking with quad cane at home    Person(s) Educated Patient    Methods Explanation    Comprehension Verbalized understanding            PT Short Term Goals - 02/08/20 0855      PT SHORT TERM GOAL #1   Title Patient will be able to ambulate 50 feet with RW with SBA to improve home ambulation    Baseline <20 feet with RW min A; 115 ft with supervision/min guard using RW 02/01/2020    Time 4    Period Weeks    Status Achieved    Target Date 02/01/20      PT SHORT TERM GOAL #2   Title Patient will be able to maintain partial tandem stance for 15 seconds to improve static balance    Baseline able to  perform 15 seconds, each foot position no UE support 02/01/2020    Time 4    Period Weeks    Status Achieved    Target Date 02/01/20      PT SHORT TERM GOAL #3   Title Patient will be able to go up and don 8 steps with use of one rail with CGA to improve ability to go up and down the stairs at home.    Baseline bilateral rails min assist, difficulty with foot   clearance in clinic, 8 steps; at home, still scooting down steps    Time 4    Period Weeks    Status Not Met    Target Date 02/01/20      PT SHORT TERM GOAL #4   Title Patient will only use RW for indoor mobility at home (no wheelchair use) to improve functional mobility    Baseline only uses walker at home with someone in the home; only uses w/c when no one home.    Time 4    Period Weeks    Status Achieved    Target Date 02/01/20             PT Long Term Goals - 02/10/20 0948      PT LONG TERM GOAL #1   Title Patient will be able to ambulate >1000 feet with RW to improve community ambulation    Baseline 20 feet with RW    Time 8    Period Weeks    Status On-going      PT LONG TERM GOAL #2   Title Patient will be able to perform TUG <25 seconds with RW to improve overall functional mobility    Baseline 36 seconds with RW    Time 8    Period Weeks    Status On-going      PT LONG TERM GOAL #3   Title Patient will be able to go up and down 17 steps with use of one rail with sBA to improve ability to go up and down steps    Baseline min A going up and scoots down on his butt    Time 8    Period Weeks    Status On-going      PT LONG TERM GOAL #4   Title Patient will demo improvement on Berg balance scale to at least 43/56 to improve functional balance and reduce fall risk    Baseline 30/56; 37/56 02/10/2020    Time 8    Period Weeks    Status Revised                 Plan - 02/15/20 9622    Clinical Impression Statement Today's skilled session was focused on continued progression of strength, balance  and endurance. Patient continues to demonstrate increased intentional tremors with box tap exercises which affects his ability to climb up and down stairs. patient is demonstrating improving endurance with exercises and requires fewer rest braks. Patient tolerated progression from walker to quad cane for short distane ambulation but will require further training.    Personal Factors and Comorbidities Comorbidity 3+;Past/Current Experience;Time since onset of injury/illness/exacerbation;Transportation;Fitness;Age    Comorbidities CHF, ESRD, HTN    Examination-Activity Limitations Bathing;Bed Mobility;Caring for Others;Carry;Dressing;Hygiene/Grooming;Lift;Locomotion Level;Self Feeding;Sit;Squat;Stairs;Stand;Toileting;Transfers    Examination-Participation Restrictions Cleaning;Community Activity;Driving;Interpersonal Relationship;Laundry;Medication Management;Meal Prep;Shop;Yard Work    Stability/Clinical Decision Making Evolving/Moderate complexity    Rehab Potential Good    PT Frequency 2x / week    PT Duration 8 weeks    PT Treatment/Interventions ADLs/Self Care Home Management;Gait training;Stair training;Functional mobility training;Therapeutic activities;Therapeutic exercise;Balance training;Neuromuscular re-education;Patient/family education;Manual techniques;Passive range of motion;Joint Manipulations    PT Next Visit Plan Work on step ups, step taps, SLS activities; continue to work on functional lower extremity strength; gait with attention to posture, foot clearance/heelstrike; stair negotiation practice    PT [place] and Agree with Plan of Care Patient           Patient will  benefit from skilled therapeutic intervention in order to improve the following deficits and impairments:  Abnormal gait, Decreased activity tolerance, Decreased balance, Decreased mobility, Decreased endurance, Decreased coordination, Decreased range of motion, Decreased  safety awareness, Decreased strength, Difficulty walking, Impaired flexibility, Postural dysfunction, Impaired UE functional use, Impaired tone  Visit Diagnosis: Unsteadiness on feet  Muscle weakness (generalized)     Problem List Patient Active Problem List   Diagnosis Date Noted  . 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 Hypoxic brain injury ([place]) 08/12/2019  . Gait abnormality 08/12/2019  . Coagulase negative Staphylococcus bacteremia 06/30/2019  . History of gout 06/17/2019  . 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 Pain aggravated by sitting 06/09/2019  . Bowel incontinence 06/09/2019  . Protein-calorie malnutrition ([place]) 06/01/2019  . Weakness acquired in ICU   . Anoxic brain injury ([place])   . Physical deconditioning   . History of cardiac arrest 04/27/2019  . History of non-ST elevation myocardial infarction (NSTEMI) 04/18/2019  . Pulmonary hypertension ([place]) 04/14/2019  . Morbid obesity ([place]) 04/14/2019  . Diabetic neuropathy ([place]) 04/02/2016  . Hypertension associated with diabetes ([place]) 10/03/2015  . HLD (hyperlipidemia) 09/06/2015  . Stage 5 chronic kidney disease on chronic dialysis ([place]) 08/31/2015  . DM (diabetes mellitus), type 2 with renal complications ([place]) 75/64/3329  . OSA treated with BiPAP 12/12/2013  . Coronary atherosclerosis 11/01/2009    Kerrie Pleasure, PT 02/15/2020, 9:26 AM  [facility] [address] [place], [place], [address] Phone: [phone]   Fax:  [phone]  Name: Troy Wells MRN: [id number] Date of Birth: 03-Apr-1966

## 2020-02-16 DIAGNOSIS — D631 Anemia in chronic kidney disease: Secondary | ICD-10-CM | POA: Diagnosis not present

## 2020-02-16 DIAGNOSIS — E1122 Type 2 diabetes mellitus with diabetic chronic kidney disease: Secondary | ICD-10-CM | POA: Diagnosis not present

## 2020-02-16 DIAGNOSIS — Z992 Dependence on renal dialysis: Secondary | ICD-10-CM | POA: Diagnosis not present

## 2020-02-16 DIAGNOSIS — R2681 Unsteadiness on feet: Secondary | ICD-10-CM | POA: Diagnosis not present

## 2020-02-16 DIAGNOSIS — M6281 Muscle weakness (generalized): Secondary | ICD-10-CM | POA: Diagnosis not present

## 2020-02-16 DIAGNOSIS — N2581 Secondary hyperparathyroidism of renal origin: Secondary | ICD-10-CM | POA: Diagnosis not present

## 2020-02-16 DIAGNOSIS — E1121 Type 2 diabetes mellitus with diabetic nephropathy: Secondary | ICD-10-CM | POA: Diagnosis not present

## 2020-02-16 DIAGNOSIS — N186 End stage renal disease: Secondary | ICD-10-CM | POA: Diagnosis not present

## 2020-02-17 ENCOUNTER — Encounter: Payer: Self-pay | Admitting: Physical Therapy

## 2020-02-17 ENCOUNTER — Ambulatory Visit: Payer: Medicare Other | Attending: Family Medicine | Admitting: Physical Therapy

## 2020-02-17 ENCOUNTER — Other Ambulatory Visit: Payer: Self-pay

## 2020-02-17 DIAGNOSIS — M6281 Muscle weakness (generalized): Secondary | ICD-10-CM | POA: Insufficient documentation

## 2020-02-17 DIAGNOSIS — R2681 Unsteadiness on feet: Secondary | ICD-10-CM | POA: Diagnosis not present

## 2020-02-18 DIAGNOSIS — D631 Anemia in chronic kidney disease: Secondary | ICD-10-CM | POA: Diagnosis not present

## 2020-02-18 DIAGNOSIS — N186 End stage renal disease: Secondary | ICD-10-CM | POA: Diagnosis not present

## 2020-02-18 DIAGNOSIS — E1121 Type 2 diabetes mellitus with diabetic nephropathy: Secondary | ICD-10-CM | POA: Diagnosis not present

## 2020-02-18 DIAGNOSIS — N2581 Secondary hyperparathyroidism of renal origin: Secondary | ICD-10-CM | POA: Diagnosis not present

## 2020-02-18 DIAGNOSIS — Z992 Dependence on renal dialysis: Secondary | ICD-10-CM | POA: Diagnosis not present

## 2020-02-18 NOTE — Therapy (Signed)
[facility] [address] [place], [place], [address] Phone: [phone]   Fax:  [phone]  Physical Therapy Treatment  Patient Details  Name: Troy Wells MRN: [id number] Date of Birth: 05/05/1966 Referring Provider (PT): Dr. Andrena Mews   Encounter Date: 02/17/2020   PT End of Session - 02/17/20 0853    Visit Number 13    Number of Visits 17    Date for PT Re-Evaluation 02/29/20    Authorization Type Medicare, PN 02/08/20    Progress Note Due on Visit 20    PT Start Time 0849    PT Stop Time 0928    PT Time Calculation (min) 39 min    Equipment Utilized During Treatment Gait belt    Activity Tolerance Patient tolerated treatment well;No increased pain   Tremors in legs with fatigue/balance challenge in SLS   Behavior During Therapy [facility] for tasks assessed/performed           Past Medical History:  Diagnosis Date  . CARDIAC ARREST 11/01/2009   Qualifier: Diagnosis of  By: Selena Batten CMA, Jewel    . Colon polyps 01/02/2016   Colonoscopy July 2017 - One 3 mm polyp in the transverse colon, removed with a cold biopsy forceps. Resected and retrieved. - One 3 mm polyp in the rectum, removed with a cold biopsy forceps. Resected and retrieved. - Diverticulosis in the entire examined colon. - Non-bleeding internal hemorrhoids. - The examination was otherwise normal. - Significant looping which prolonged cecal    . Coronary artery disease 08/15/2009   with AMI  . Coronary atherosclerosis 11/01/2009   Qualifier: Diagnosis of  By: Selena Batten CMA, Jewel    . Dialysis patient ([place]) 09/27/2014   mon, wed, and fri  . Diverticulosis of colon without hemorrhage 01/02/2016   Colonoscopy July 2017 - One 3 mm polyp in the transverse colon, removed with a cold biopsy forceps. Resected and retrieved. - One 3 mm polyp in the rectum, removed with a cold biopsy forceps. Resected and retrieved. - Diverticulosis in the entire  examined colon. - Non-bleeding internal hemorrhoids. - The examination was otherwise normal. - Significant looping which prolonged cecal    . DM (diabetes mellitus), type 2 with renal complications ([place]) 97/94/8016  . Essential hypertension 11/01/2009   Qualifier: Diagnosis of  By: Selena Batten CMA, Jewel    . Gout 10/21/2018  . History of acute respiratory distress syndrome (ARDS) due to COVID-19 virus   . History of cardiac arrest 04/27/2019  . History of non-ST elevation myocardial infarction (NSTEMI) 04/18/2019  . history of respiratory arrest   . Hyperlipidemia   . Hypertension   . Mitral valve regurgitation 09/19/2015   Echo 09/2015   . 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 OSA treated with BiPAP 06/17/2010  . Sleep apnea    cpap nightly    Past Surgical History:  Procedure Laterality Date  . AV FISTULA PLACEMENT Left 09/27/2014  . INSERTION OF ARTERIOVENOUS (AV) ARTEGRAFT ARM Left 04/02/2018   Procedure: INSERTION OF ARTERIOVENOUS (AV) ARTEGRAFT ARM LEFT UPPER ARM;  Surgeon: Waynetta Sandy, MD;  Location: [facility];  Service: Vascular;  Laterality: Left;  . INSERTION OF DIALYSIS CATHETER N/A 04/02/2018   Procedure: INSERTION OF DIALYSIS CATHETER, right internal jugular;  Surgeon: Waynetta Sandy, MD;  Location: [facility];  Service: Vascular;  Laterality: N/A;  . NECK SURGERY     C6 & C7 replaced 30 yrs ago per pt  . REVISON OF ARTERIOVENOUS FISTULA Left 04/02/2018  Procedure: REVISION PLICATION OF ARTERIOVENOUS FISTULA ARM;  Surgeon: Waynetta Sandy, MD;  Location: [facility];  Service: Vascular;  Laterality: Left;  . WISDOM TOOTH EXTRACTION      There were no vitals filed for this visit.   Subjective Assessment - 02/17/20 0852    Subjective No new complaitns. No falls or pain to report.    Pertinent History CHF, HTN, ERSD    Limitations Standing;Walking;Writing;House hold activities;Lifting    How long can you sit comfortably? supported sitting no problem    How long  can you stand comfortably? <5 min    How long can you walk comfortably? 20 feet with walker    Patient Stated Goals improve walking get stronger    Currently in Pain? No/denies                 [facility] Adult PT Treatment/Exercise - 02/17/20 0856      Transfers   Transfers Sit to Stand;Stand to Sit    Sit to Stand 5: Supervision;With upper extremity assist;From chair/3-in-1;From bed    Stand to Sit 5: Supervision;With upper extremity assist;To chair/3-in-1;To bed      Ambulation/Gait   Ambulation/Gait Yes    Ambulation/Gait Assistance 4: Min guard    Ambulation/Gait Assistance Details trialed all 3 styles of canes with gait for 3 laps each around table with cane in right hand, left hand support on table. cues on sequencing with all canes. after trial of all 3 styles pt perfers the straight cane with rubber quad tip. then performed 3 additional laps around table.    Ambulation Distance (Feet) --   laps around large table in gym   Assistive device Straight cane;Small based quad cane;Large base quad cane    Gait Pattern Decreased stride length;Decreased trunk rotation;Trunk flexed;Wide base of support;Poor foot clearance - left;Poor foot clearance - right    Ambulation Surface Level;Indoor      Exercises   Exercises Other Exercises    Other Exercises  at stairs with bil UE support: forward step ups for 2 sets of 5 reps with cues on form/technique. min guard assist to min assit due to increased tremors with quad engagment for lifting/powering up; then standing at base of steps with bil UE support- alternating foot taps to bottom step for 10 reps each side with min guard assist. increased time needed with both of these due to tremors in LE's                     PT Short Term Goals - 02/08/20 0855      PT SHORT TERM GOAL #1   Title Patient will be able to ambulate 50 feet with RW with SBA to improve home ambulation    Baseline <20 feet with RW min A; 115 ft with supervision/min  guard using RW 02/01/2020    Time 4    Period Weeks    Status Achieved    Target Date 02/01/20      PT SHORT TERM GOAL #2   Title Patient will be able to maintain partial tandem stance for 15 seconds to improve static balance    Baseline able to perform 15 seconds, each foot position no UE support 02/01/2020    Time 4    Period Weeks    Status Achieved    Target Date 02/01/20      PT SHORT TERM GOAL #3   Title Patient will be able to go up and don 8 steps  with use of one rail with CGA to improve ability to go up and down the stairs at home.    Baseline bilateral rails min assist, difficulty with foot clearance in clinic, 8 steps; at home, still scooting down steps    Time 4    Period Weeks    Status Not Met    Target Date 02/01/20      PT SHORT TERM GOAL #4   Title Patient will only use RW for indoor mobility at home (no wheelchair use) to improve functional mobility    Baseline only uses walker at home with someone in the home; only uses w/c when no one home.    Time 4    Period Weeks    Status Achieved    Target Date 02/01/20             PT Long Term Goals - 02/10/20 0948      PT LONG TERM GOAL #1   Title Patient will be able to ambulate >1000 feet with RW to improve community ambulation    Baseline 20 feet with RW    Time 8    Period Weeks    Status On-going      PT LONG TERM GOAL #2   Title Patient will be able to perform TUG <25 seconds with RW to improve overall functional mobility    Baseline 36 seconds with RW    Time 8    Period Weeks    Status On-going      PT LONG TERM GOAL #3   Title Patient will be able to go up and down 17 steps with use of one rail with sBA to improve ability to go up and down steps    Baseline min A going up and scoots down on his butt    Time 8    Period Weeks    Status On-going      PT LONG TERM GOAL #4   Title Patient will demo improvement on Berg balance scale to at least 43/56 to improve functional balance and reduce fall  risk    Baseline 30/56; 37/56 02/10/2020    Time 8    Period Weeks    Status Revised                 Plan - 02/17/20 0853    Clinical Impression Statement Today's skilled session focused on trial of several style of canes with pt perference for straight cane with rubber quad tip. Worked on sequencing with cane around table where pt can use left UE as needed for balance. Pt to work on this at home once he purchases a cane. Remainder of session focused on bil LE strengthening with mild increase in tremors noted, no balance loss. The pt is progressing toward goals and should benefit from continued PT to progress toward unmet goals.    Personal Factors and Comorbidities Comorbidity 3+;Past/Current Experience;Time since onset of injury/illness/exacerbation;Transportation;Fitness;Age    Comorbidities CHF, ESRD, HTN    Examination-Activity Limitations Bathing;Bed Mobility;Caring for Others;Carry;Dressing;Hygiene/Grooming;Lift;Locomotion Level;Self Feeding;Sit;Squat;Stairs;Stand;Toileting;Transfers    Examination-Participation Restrictions Cleaning;Community Activity;Driving;Interpersonal Relationship;Laundry;Medication Management;Meal Prep;Shop;Yard Work    Stability/Clinical Decision Making Evolving/Moderate complexity    Rehab Potential Good    PT Frequency 2x / week    PT Duration 8 weeks    PT Treatment/Interventions ADLs/Self Care Home Management;Gait training;Stair training;Functional mobility training;Therapeutic activities;Therapeutic exercise;Balance training;Neuromuscular re-education;Patient/family education;Manual techniques;Passive range of motion;Joint Manipulations    PT Next Visit Plan Work on step ups, step taps, SLS  activities; continue to work on functional lower extremity strength; gait with attention to posture, foot clearance/heelstrike; stair negotiation practice; gait with cane with left UE on sturdy surface/next t wall    PT Home Exercise Plan [employer] [id number]     Consulted and Agree with Plan of Care Patient           Patient will benefit from skilled therapeutic intervention in order to improve the following deficits and impairments:  Abnormal gait, Decreased activity tolerance, Decreased balance, Decreased mobility, Decreased endurance, Decreased coordination, Decreased range of motion, Decreased safety awareness, Decreased strength, Difficulty walking, Impaired flexibility, Postural dysfunction, Impaired UE functional use, Impaired tone  Visit Diagnosis: Unsteadiness on feet  Muscle weakness (generalized)     Problem List Patient Active Problem List   Diagnosis Date Noted  . 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 Hypoxic brain injury ([place]) 08/12/2019  . Gait abnormality 08/12/2019  . Coagulase negative Staphylococcus bacteremia 06/30/2019  . History of gout 06/17/2019  . 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 Pain aggravated by sitting 06/09/2019  . Bowel incontinence 06/09/2019  . Protein-calorie malnutrition ([place]) 06/01/2019  . Weakness acquired in ICU   . Anoxic brain injury ([place])   . Physical deconditioning   . History of cardiac arrest 04/27/2019  . History of non-ST elevation myocardial infarction (NSTEMI) 04/18/2019  . Pulmonary hypertension ([place]) 04/14/2019  . Morbid obesity ([place]) 04/14/2019  . Diabetic neuropathy ([place]) 04/02/2016  . Hypertension associated with diabetes ([place]) 10/03/2015  . HLD (hyperlipidemia) 09/06/2015  . Stage 5 chronic kidney disease on chronic dialysis ([place]) 08/31/2015  . DM (diabetes mellitus), type 2 with renal complications ([place]) 11/01/3356  . OSA treated with BiPAP 12/12/2013  . Coronary atherosclerosis 11/01/2009    Willow Ora, PTA, [facility] [address] [place], [place] [address] [phone] 02/18/20, 12:26 AM   Name: Troy Wells MRN: [id number] Date of Birth: 10-27-1965

## 2020-02-21 DIAGNOSIS — D631 Anemia in chronic kidney disease: Secondary | ICD-10-CM | POA: Diagnosis not present

## 2020-02-21 DIAGNOSIS — N186 End stage renal disease: Secondary | ICD-10-CM | POA: Diagnosis not present

## 2020-02-21 DIAGNOSIS — N2581 Secondary hyperparathyroidism of renal origin: Secondary | ICD-10-CM | POA: Diagnosis not present

## 2020-02-21 DIAGNOSIS — E1121 Type 2 diabetes mellitus with diabetic nephropathy: Secondary | ICD-10-CM | POA: Diagnosis not present

## 2020-02-21 DIAGNOSIS — Z992 Dependence on renal dialysis: Secondary | ICD-10-CM | POA: Diagnosis not present

## 2020-02-22 ENCOUNTER — Other Ambulatory Visit: Payer: Self-pay

## 2020-02-22 ENCOUNTER — Ambulatory Visit: Payer: Medicare Other

## 2020-02-22 DIAGNOSIS — M6281 Muscle weakness (generalized): Secondary | ICD-10-CM

## 2020-02-22 DIAGNOSIS — R2681 Unsteadiness on feet: Secondary | ICD-10-CM

## 2020-02-22 NOTE — Therapy (Signed)
[facility] [address] [place], [place], [address] Phone: [phone]   Fax:  [phone]  Physical Therapy Treatment  Patient Details  Name: Troy Wells MRN: [id number] Date of Birth: Jul 23, 1965 Referring Provider (PT): Dr. Andrena Mews   Encounter Date: 02/22/2020   PT End of Session - 02/22/20 0853    Visit Number 14    Number of Visits 17    Date for PT Re-Evaluation 02/29/20    Authorization Type Medicare, PN 02/08/20    Progress Note Due on Visit 20    PT Start Time 0850    PT Stop Time 0930    PT Time Calculation (min) 40 min    Equipment Utilized During Treatment Gait belt    Activity Tolerance Patient tolerated treatment well;No increased pain   Tremors in legs with fatigue/balance challenge in SLS   Behavior During Therapy [facility] for tasks assessed/performed           Past Medical History:  Diagnosis Date  . CARDIAC ARREST 11/01/2009   Qualifier: Diagnosis of  By: Selena Batten CMA, [place]    . Colon polyps 01/02/2016   Colonoscopy July 2017 - One 3 mm polyp in the transverse colon, removed with a cold biopsy forceps. Resected and retrieved. - One 3 mm polyp in the rectum, removed with a cold biopsy forceps. Resected and retrieved. - Diverticulosis in the entire examined colon. - Non-bleeding internal hemorrhoids. - The examination was otherwise normal. - Significant looping which prolonged cecal    . Coronary artery disease 08/15/2009   with AMI  . Coronary atherosclerosis 11/01/2009   Qualifier: Diagnosis of  By: Selena Batten CMA, Jewel    . Dialysis patient ([address]) 09/27/2014   mon, wed, and fri  . Diverticulosis of colon without hemorrhage 01/02/2016   Colonoscopy July 2017 - One 3 mm polyp in the transverse colon, removed with a cold biopsy forceps. Resected and retrieved. - One 3 mm polyp in the rectum, removed with a cold biopsy forceps. Resected and retrieved. - Diverticulosis in the entire  examined colon. - Non-bleeding internal hemorrhoids. - The examination was otherwise normal. - Significant looping which prolonged cecal    . DM (diabetes mellitus), type 2 with renal complications ([place]) 64/15/8309  . Essential hypertension 11/01/2009   Qualifier: Diagnosis of  By: Selena Batten CMA, Jewel    . Gout 10/21/2018  . History of acute respiratory distress syndrome (ARDS) due to COVID-19 virus   . History of cardiac arrest 04/27/2019  . History of non-ST elevation myocardial infarction (NSTEMI) 04/18/2019  . history of respiratory arrest   . Hyperlipidemia   . Hypertension   . Mitral valve regurgitation 09/19/2015   Echo 09/2015   . 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 OSA treated with BiPAP 06/17/2010  . Sleep apnea    cpap nightly    Past Surgical History:  Procedure Laterality Date  . AV FISTULA PLACEMENT Left 09/27/2014  . INSERTION OF ARTERIOVENOUS (AV) ARTEGRAFT ARM Left 04/02/2018   Procedure: INSERTION OF ARTERIOVENOUS (AV) ARTEGRAFT ARM LEFT UPPER ARM;  Surgeon: Waynetta Sandy, MD;  Location: [place];  Service: Vascular;  Laterality: Left;  . INSERTION OF DIALYSIS CATHETER N/A 04/02/2018   Procedure: INSERTION OF DIALYSIS CATHETER, right internal jugular;  Surgeon: Waynetta Sandy, MD;  Location: [place];  Service: Vascular;  Laterality: N/A;  . NECK SURGERY     C6 & C7 replaced 30 yrs ago per pt  . REVISON OF ARTERIOVENOUS FISTULA Left 04/02/2018  Procedure: REVISION PLICATION OF ARTERIOVENOUS FISTULA ARM;  Surgeon: Waynetta Sandy, MD;  Location: [place];  Service: Vascular;  Laterality: Left;  . WISDOM TOOTH EXTRACTION      There were no vitals filed for this visit.   Subjective Assessment - 02/22/20 0853    Subjective Doing well. Feels that he is slowly getting stronger.    Pertinent History CHF, HTN, ERSD    Limitations Standing;Walking;Writing;House hold activities;Lifting    How long can you sit comfortably? supported sitting no problem     How long can you stand comfortably? <5 min    How long can you walk comfortably? 20 feet with walker    Patient Stated Goals improve walking get stronger              Walking tandem fwd and bwd in parallel bars with bil HHA: 5 laps Box taps: 6" box in parallel bars with bil HHA, 3lbs on ankles: 2 x 10 Seated hip flexion: 3lbs 20x R and L Seated LAQ: 3lbs: 20x R and L  Wobble board: AP and ML standing with wide BOS: no HHA: 1', cues to stand up straight (pt needed 2" step boxes under wobble board on both side to stabilize for patient to get on and off from it safely) Stepping over fwd 4x yard sticks: bil HHA on parallel bars: 6 laps Stepping over laterally 4x yard sticks: bil HHA on parallel bars: 3 laps each way.                         PT Short Term Goals - 02/08/20 0855      PT SHORT TERM GOAL #1   Title Patient will be able to ambulate 50 feet with RW with SBA to improve home ambulation    Baseline <20 feet with RW min A; 115 ft with supervision/min guard using RW 02/01/2020    Time 4    Period Weeks    Status Achieved    Target Date 02/01/20      PT SHORT TERM GOAL #2   Title Patient will be able to maintain partial tandem stance for 15 seconds to improve static balance    Baseline able to perform 15 seconds, each foot position no UE support 02/01/2020    Time 4    Period Weeks    Status Achieved    Target Date 02/01/20      PT SHORT TERM GOAL #3   Title Patient will be able to go up and don 8 steps with use of one rail with CGA to improve ability to go up and down the stairs at home.    Baseline bilateral rails min assist, difficulty with foot clearance in clinic, 8 steps; at home, still scooting down steps    Time 4    Period Weeks    Status Not Met    Target Date 02/01/20      PT SHORT TERM GOAL #4   Title Patient will only use RW for indoor mobility at home (no wheelchair use) to improve functional mobility    Baseline only uses walker at  home with someone in the home; only uses w/c when no one home.    Time 4    Period Weeks    Status Achieved    Target Date 02/01/20             PT Long Term Goals - 02/10/20 2683      PT  LONG TERM GOAL #1   Title Patient will be able to ambulate >1000 feet with RW to improve community ambulation    Baseline 20 feet with RW    Time 8    Period Weeks    Status On-going      PT LONG TERM GOAL #2   Title Patient will be able to perform TUG <25 seconds with RW to improve overall functional mobility    Baseline 36 seconds with RW    Time 8    Period Weeks    Status On-going      PT LONG TERM GOAL #3   Title Patient will be able to go up and down 17 steps with use of one rail with sBA to improve ability to go up and down steps    Baseline min A going up and scoots down on his butt    Time 8    Period Weeks    Status On-going      PT LONG TERM GOAL #4   Title Patient will demo improvement on Berg balance scale to at least 43/56 to improve functional balance and reduce fall risk    Baseline 30/56; 37/56 02/10/2020    Time 8    Period Weeks    Status Revised                 Plan - 02/22/20 0853    Clinical Impression Statement Pt is gradually progressing. He is still showing increased tremors with new movements with some of the exercises. Patient may benefit from trial of PWR exercises to see if it helps him move better with less tremors.    Personal Factors and Comorbidities Comorbidity 3+;Past/Current Experience;Time since onset of injury/illness/exacerbation;Transportation;Fitness;Age    Comorbidities CHF, ESRD, HTN    Examination-Activity Limitations Bathing;Bed Mobility;Caring for Others;Carry;Dressing;Hygiene/Grooming;Lift;Locomotion Level;Self Feeding;Sit;Squat;Stairs;Stand;Toileting;Transfers    Examination-Participation Restrictions Cleaning;Community Activity;Driving;Interpersonal Relationship;Laundry;Medication Management;Meal Prep;Shop;Yard Work     Stability/Clinical Decision Making Evolving/Moderate complexity    Rehab Potential Good    PT Frequency 2x / week    PT Duration 8 weeks    PT Treatment/Interventions ADLs/Self Care Home Management;Gait training;Stair training;Functional mobility training;Therapeutic activities;Therapeutic exercise;Balance training;Neuromuscular re-education;Patient/family education;Manual techniques;Passive range of motion;Joint Manipulations    PT Next Visit Plan Try PWR moves progress from sitting to standing    PT Home Exercise Plan [employer] [id number]    Consulted and Agree with Plan of Care Patient           Patient will benefit from skilled therapeutic intervention in order to improve the following deficits and impairments:  Abnormal gait, Decreased activity tolerance, Decreased balance, Decreased mobility, Decreased endurance, Decreased coordination, Decreased range of motion, Decreased safety awareness, Decreased strength, Difficulty walking, Impaired flexibility, Postural dysfunction, Impaired UE functional use, Impaired tone  Visit Diagnosis: Unsteadiness on feet  Muscle weakness (generalized)     Problem List Patient Active Problem List   Diagnosis Date Noted  . 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 Hypoxic brain injury ([place]) 08/12/2019  . Gait abnormality 08/12/2019  . Coagulase negative Staphylococcus bacteremia 06/30/2019  . History of gout 06/17/2019  . 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 Pain aggravated by sitting 06/09/2019  . Bowel incontinence 06/09/2019  . Protein-calorie malnutrition ([place]) 06/01/2019  . Weakness acquired in ICU   . Anoxic brain injury ([place])   . Physical deconditioning   . History of cardiac arrest 04/27/2019  . History of non-ST elevation myocardial infarction (NSTEMI) 04/18/2019  . Pulmonary hypertension ([place]) 04/14/2019  . Morbid obesity ([place])  04/14/2019  . Diabetic neuropathy ([place]) 04/02/2016  . Hypertension associated with diabetes ([place]) 10/03/2015  . HLD  (hyperlipidemia) 09/06/2015  . Stage 5 chronic kidney disease on chronic dialysis ([place]) 08/31/2015  . DM (diabetes mellitus), type 2 with renal complications ([place]) 77/93/9030  . OSA treated with BiPAP 12/12/2013  . Coronary atherosclerosis 11/01/2009    Kerrie Pleasure, PT 02/22/2020, 9:35 AM  [facility] [address] [place], [place], [address] Phone: [phone]   Fax:  [phone]  Name: Troy Wells MRN: [id number] Date of Birth: Aug 26, 1965

## 2020-02-23 DIAGNOSIS — D631 Anemia in chronic kidney disease: Secondary | ICD-10-CM | POA: Diagnosis not present

## 2020-02-23 DIAGNOSIS — N186 End stage renal disease: Secondary | ICD-10-CM | POA: Diagnosis not present

## 2020-02-23 DIAGNOSIS — Z992 Dependence on renal dialysis: Secondary | ICD-10-CM | POA: Diagnosis not present

## 2020-02-23 DIAGNOSIS — N2581 Secondary hyperparathyroidism of renal origin: Secondary | ICD-10-CM | POA: Diagnosis not present

## 2020-02-23 DIAGNOSIS — E1121 Type 2 diabetes mellitus with diabetic nephropathy: Secondary | ICD-10-CM | POA: Diagnosis not present

## 2020-02-24 ENCOUNTER — Encounter: Payer: Self-pay | Admitting: Physical Therapy

## 2020-02-24 ENCOUNTER — Ambulatory Visit: Payer: Medicare Other | Admitting: Physical Therapy

## 2020-02-24 ENCOUNTER — Other Ambulatory Visit: Payer: Self-pay

## 2020-02-24 DIAGNOSIS — M6281 Muscle weakness (generalized): Secondary | ICD-10-CM

## 2020-02-24 DIAGNOSIS — R2681 Unsteadiness on feet: Secondary | ICD-10-CM

## 2020-02-25 DIAGNOSIS — D631 Anemia in chronic kidney disease: Secondary | ICD-10-CM | POA: Diagnosis not present

## 2020-02-25 DIAGNOSIS — Z992 Dependence on renal dialysis: Secondary | ICD-10-CM | POA: Diagnosis not present

## 2020-02-25 DIAGNOSIS — N186 End stage renal disease: Secondary | ICD-10-CM | POA: Diagnosis not present

## 2020-02-25 DIAGNOSIS — E1121 Type 2 diabetes mellitus with diabetic nephropathy: Secondary | ICD-10-CM | POA: Diagnosis not present

## 2020-02-25 DIAGNOSIS — N2581 Secondary hyperparathyroidism of renal origin: Secondary | ICD-10-CM | POA: Diagnosis not present

## 2020-02-25 NOTE — Therapy (Signed)
[facility] [address] [place], [place], [address] Phone: [phone]   Fax:  [phone]  Physical Therapy Treatment  Patient Details  Name: Troy Wells MRN: [id number] Date of Birth: 1966-02-07 Referring Provider (PT): Dr. Andrena Mews   Encounter Date: 02/24/2020   PT End of Session - 02/24/20 0850    Visit Number 15    Number of Visits 17    Date for PT Re-Evaluation 02/29/20    Authorization Type Medicare, PN 02/08/20    Progress Note Due on Visit 20    PT Start Time 0847    PT Stop Time 0930    PT Time Calculation (min) 43 min    Equipment Utilized During Treatment Gait belt    Activity Tolerance Patient tolerated treatment well;No increased pain   Tremors in legs with fatigue/balance challenge in SLS   Behavior During Therapy [facility] for tasks assessed/performed           Past Medical History:  Diagnosis Date  . CARDIAC ARREST 11/01/2009   Qualifier: Diagnosis of  By: Selena Batten CMA, Jewel    . Colon polyps 01/02/2016   Colonoscopy July 2017 - One 3 mm polyp in the transverse colon, removed with a cold biopsy forceps. Resected and retrieved. - One 3 mm polyp in the rectum, removed with a cold biopsy forceps. Resected and retrieved. - Diverticulosis in the entire examined colon. - Non-bleeding internal hemorrhoids. - The examination was otherwise normal. - Significant looping which prolonged cecal    . Coronary artery disease 08/15/2009   with AMI  . Coronary atherosclerosis 11/01/2009   Qualifier: Diagnosis of  By: Selena Batten CMA, Jewel    . Dialysis patient ([place]) 09/27/2014   mon, wed, and fri  . Diverticulosis of colon without hemorrhage 01/02/2016   Colonoscopy July 2017 - One 3 mm polyp in the transverse colon, removed with a cold biopsy forceps. Resected and retrieved. - One 3 mm polyp in the rectum, removed with a cold biopsy forceps. Resected and retrieved. - Diverticulosis in the entire  examined colon. - Non-bleeding internal hemorrhoids. - The examination was otherwise normal. - Significant looping which prolonged cecal    . DM (diabetes mellitus), type 2 with renal complications ([place]) 82/42/3536  . Essential hypertension 11/01/2009   Qualifier: Diagnosis of  By: Selena Batten CMA, Jewel    . Gout 10/21/2018  . History of acute respiratory distress syndrome (ARDS) due to COVID-19 virus   . History of cardiac arrest 04/27/2019  . History of non-ST elevation myocardial infarction (NSTEMI) 04/18/2019  . history of respiratory arrest   . Hyperlipidemia   . Hypertension   . Mitral valve regurgitation 09/19/2015   Echo 09/2015   . 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 OSA treated with BiPAP 06/17/2010  . Sleep apnea    cpap nightly    Past Surgical History:  Procedure Laterality Date  . AV FISTULA PLACEMENT Left 09/27/2014  . INSERTION OF ARTERIOVENOUS (AV) ARTEGRAFT ARM Left 04/02/2018   Procedure: INSERTION OF ARTERIOVENOUS (AV) ARTEGRAFT ARM LEFT UPPER ARM;  Surgeon: Waynetta Sandy, MD;  Location: [facility];  Service: Vascular;  Laterality: Left;  . INSERTION OF DIALYSIS CATHETER N/A 04/02/2018   Procedure: INSERTION OF DIALYSIS CATHETER, right internal jugular;  Surgeon: Waynetta Sandy, MD;  Location: [place];  Service: Vascular;  Laterality: N/A;  . NECK SURGERY     C6 & C7 replaced 30 yrs ago per pt  . REVISON OF ARTERIOVENOUS FISTULA Left 04/02/2018  Procedure: REVISION PLICATION OF ARTERIOVENOUS FISTULA ARM;  Surgeon: Waynetta Sandy, MD;  Location: [facility];  Service: Vascular;  Laterality: Left;  . WISDOM TOOTH EXTRACTION      There were no vitals filed for this visit.   Subjective Assessment - 02/24/20 0849    Subjective No new complaints.  No falls or pain to report. Only using wheelchair with going outside of home to appt's, uses walker all other times. He reports this is due to getting tired easily.    Pertinent History CHF, HTN, ERSD     Limitations Standing;Walking;Writing;House hold activities;Lifting    How long can you sit comfortably? supported sitting no problem    How long can you stand comfortably? <5 min    How long can you walk comfortably? 20 feet with walker    Patient Stated Goals improve walking get stronger    Currently in Pain? No/denies               [facility] Adult PT Treatment/Exercise - 02/24/20 0851      Transfers   Transfers Sit to Stand;Stand to Sit;Stand Pivot Transfers    Sit to Stand 5: Supervision;With upper extremity assist;From chair/3-in-1;From bed    Stand to Sit 5: Supervision;With upper extremity assist;To chair/3-in-1;To bed    Stand Pivot Transfers 4: Min guard    Stand Pivot Transfer Details (indicate cue type and reason) with RW from wheelchair<>mat table             PWR [facility]) - 02/24/20 0917    PWR! Up 10    PWR! Rock 10    PWR! Twist 10    PWR Step 10    Comments modified to have UE support on stable surface as needed. cues on form/technique.     PWR! Up 10    PWR! Rock 10    PWR! Twist 10    PWR! Step 10    Comments visual and verbal cues needed on form/technique. increased cues needced for power behind the steps and coordination of moving together with movements                 PT Short Term Goals - 02/08/20 0855      PT SHORT TERM GOAL #1   Title Patient will be able to ambulate 50 feet with RW with SBA to improve home ambulation    Baseline <20 feet with RW min A; 115 ft with supervision/min guard using RW 02/01/2020    Time 4    Period Weeks    Status Achieved    Target Date 02/01/20      PT SHORT TERM GOAL #2   Title Patient will be able to maintain partial tandem stance for 15 seconds to improve static balance    Baseline able to perform 15 seconds, each foot position no UE support 02/01/2020    Time 4    Period Weeks    Status Achieved    Target Date 02/01/20      PT SHORT TERM GOAL #3   Title Patient will be able to go up and don 8 steps  with use of one rail with CGA to improve ability to go up and down the stairs at home.    Baseline bilateral rails min assist, difficulty with foot clearance in clinic, 8 steps; at home, still scooting down steps    Time 4    Period Weeks    Status Not Met    Target Date 02/01/20  PT SHORT TERM GOAL #4   Title Patient will only use RW for indoor mobility at home (no wheelchair use) to improve functional mobility    Baseline only uses walker at home with someone in the home; only uses w/c when no one home.    Time 4    Period Weeks    Status Achieved    Target Date 02/01/20             PT Long Term Goals - 02/10/20 0948      PT LONG TERM GOAL #1   Title Patient will be able to ambulate >1000 feet with RW to improve community ambulation    Baseline 20 feet with RW    Time 8    Period Weeks    Status On-going      PT LONG TERM GOAL #2   Title Patient will be able to perform TUG <25 seconds with RW to improve overall functional mobility    Baseline 36 seconds with RW    Time 8    Period Weeks    Status On-going      PT LONG TERM GOAL #3   Title Patient will be able to go up and down 17 steps with use of one rail with sBA to improve ability to go up and down steps    Baseline min A going up and scoots down on his butt    Time 8    Period Weeks    Status On-going      PT LONG TERM GOAL #4   Title Patient will demo improvement on Berg balance scale to at least 43/56 to improve functional balance and reduce fall risk    Baseline 30/56; 37/56 02/10/2020    Time 8    Period Weeks    Status Revised                 Plan - 02/24/20 0850    Clinical Impression Statement Today's skilled session focused on initiation of use of PWR! moves in sitting and standing to work on bigger movement and coordination of movements. Pt woull benefit from further use of this method to address large amplitude of movement and tremors with movements. The pt is progressing toward goals and  should benefit from continued PT to progress toward unmet goals.    Personal Factors and Comorbidities Comorbidity 3+;Past/Current Experience;Time since onset of injury/illness/exacerbation;Transportation;Fitness;Age    Comorbidities CHF, ESRD, HTN    Examination-Activity Limitations Bathing;Bed Mobility;Caring for Others;Carry;Dressing;Hygiene/Grooming;Lift;Locomotion Level;Self Feeding;Sit;Squat;Stairs;Stand;Toileting;Transfers    Examination-Participation Restrictions Cleaning;Community Activity;Driving;Interpersonal Relationship;Laundry;Medication Management;Meal Prep;Shop;Yard Work    Stability/Clinical Decision Making Evolving/Moderate complexity    Rehab Potential Good    PT Frequency 2x / week    PT Duration 8 weeks    PT Treatment/Interventions ADLs/Self Care Home Management;Gait training;Stair training;Functional mobility training;Therapeutic activities;Therapeutic exercise;Balance training;Neuromuscular re-education;Patient/family education;Manual techniques;Passive range of motion;Joint Manipulations    PT Next Visit Plan assess goals for recert vs discharge    PT [place] and Agree with Plan of Care Patient           Patient will benefit from skilled therapeutic intervention in order to improve the following deficits and impairments:  Abnormal gait, Decreased activity tolerance, Decreased balance, Decreased mobility, Decreased endurance, Decreased coordination, Decreased range of motion, Decreased safety awareness, Decreased strength, Difficulty walking, Impaired flexibility, Postural dysfunction, Impaired UE functional use, Impaired tone  Visit Diagnosis: Unsteadiness on feet  Muscle weakness (generalized)  Problem List Patient Active Problem List   Diagnosis Date Noted  . 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 Hypoxic brain injury ([place]) 08/12/2019  . Gait abnormality 08/12/2019  . Coagulase negative Staphylococcus bacteremia  06/30/2019  . History of gout 06/17/2019  . 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 Pain aggravated by sitting 06/09/2019  . Bowel incontinence 06/09/2019  . Protein-calorie malnutrition ([place]) 06/01/2019  . Weakness acquired in ICU   . Anoxic brain injury ([place])   . Physical deconditioning   . History of cardiac arrest 04/27/2019  . History of non-ST elevation myocardial infarction (NSTEMI) 04/18/2019  . Pulmonary hypertension ([place]) 04/14/2019  . Morbid obesity ([place]) 04/14/2019  . Diabetic neuropathy ([place]) 04/02/2016  . Hypertension associated with diabetes ([place]) 10/03/2015  . HLD (hyperlipidemia) 09/06/2015  . Stage 5 chronic kidney disease on chronic dialysis ([place]) 08/31/2015  . DM (diabetes mellitus), type 2 with renal complications ([place]) 70/76/1518  . OSA treated with BiPAP 12/12/2013  . Coronary atherosclerosis 11/01/2009    Willow Ora, PTA, [facility] [address], [place], [place] [address] [phone] 02/25/20, 12:55 PM   Name: Neftali Abair MRN: [id number] Date of Birth: [birth year]

## 2020-02-28 DIAGNOSIS — D631 Anemia in chronic kidney disease: Secondary | ICD-10-CM | POA: Diagnosis not present

## 2020-02-28 DIAGNOSIS — N186 End stage renal disease: Secondary | ICD-10-CM | POA: Diagnosis not present

## 2020-02-28 DIAGNOSIS — E1121 Type 2 diabetes mellitus with diabetic nephropathy: Secondary | ICD-10-CM | POA: Diagnosis not present

## 2020-02-28 DIAGNOSIS — Z992 Dependence on renal dialysis: Secondary | ICD-10-CM | POA: Diagnosis not present

## 2020-02-28 DIAGNOSIS — N2581 Secondary hyperparathyroidism of renal origin: Secondary | ICD-10-CM | POA: Diagnosis not present

## 2020-02-29 ENCOUNTER — Other Ambulatory Visit: Payer: Self-pay

## 2020-02-29 ENCOUNTER — Encounter: Payer: Self-pay | Admitting: Neurology

## 2020-02-29 ENCOUNTER — Ambulatory Visit: Payer: Medicare Other | Admitting: Neurology

## 2020-02-29 ENCOUNTER — Ambulatory Visit: Payer: Medicare Other

## 2020-02-29 DIAGNOSIS — M6281 Muscle weakness (generalized): Secondary | ICD-10-CM | POA: Diagnosis not present

## 2020-02-29 DIAGNOSIS — R2681 Unsteadiness on feet: Secondary | ICD-10-CM

## 2020-02-29 NOTE — Progress Notes (Deleted)
PATIENT: Troy Wells DOB: 09-01-1965  REASON FOR VISIT: follow up HISTORY FROM: patient  HISTORY OF PRESENT ILLNESS: Today 02/29/20  HISTORY Troy Wells is a 54 year old male, seen in request by his primary care physician Dr. Andrena Mews for evaluation of myoclonic jerking, is accompanied by his daughter note, at today's visit on August 12, 2019.  I have reviewed and summarized the referring note from the referring physician.  He had past medical history of gout, hyperlipidemia, chronic kidney disease, has been on dialysis since 2016, coronary artery disease, diabetes,   I reviewed extensive hospital admissions, he had a prolonged hospital admission October 28 through 06/16/2019 for Covid pneumonia, ARDS, he drove himself to dialysis on April 14, 2019,, presented with shortness of breath, troponin was 2115, he suffered a heart attack, was tested positive for Covid, later also hypoxemia, bradycardia on November 10, went into PEA arrest, was resuscitated after 30 minutes of CPR, prolonged ICU stay, intubated and sedated until November 2023, noted frequent body twitching, had a neurology consult, continuous EEG, did not show epileptic discharge, but did show profound diffuse encephalopathy, he was started on Keppra,  His acute respiratory distress was believed to be multifactorial, including COVID-19 infection, with superimposed bacterial infection, hypervolemia from ESRD, he was treated with vancomycin, Zosyn, later ceftriaxone,  He continued to have confusion following extubation, Keppra was discontinued on November 25,  Repeat Covid testing was negative on December 7, June 14, 2019  His hospital course was also complicated by left upper extremity, and sacral area ulcer, require wound care,  There was described bilateral upper extremity tremor since his cardiac arrest, hospital neurologist was consulted, 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  I personally reviewed MRI of the brain without contrast on May 02, 2019, heterogeneous signal changes at bilateral medial thalamus, suspicious for an anoxic brain injury, chronic right cerebellar infarction  Laboratory evaluation July 01, 2019:, Hemoglobin of 10, creatinine of 11.4, mildly decreased albumin 3.0, otherwise normal liver functional test, was 19, it was elevated 50 3 months ago, ferritin 2719, lactate dehydrogenase 195, D-dimer 1.34, negative ANA,  Lactic acid was 8.7 April 27, 2019,  Echocardiogram January 2021, ejection fraction was 30%, moderate to severely decreased left ventricular function, mildly increased left ventricular wall thickness, severely dilated left ventricular internal cavity size, left ventricle regional wall motion abnormality, inferolateral akinesis, severe inferior hypokinesis, severe anterolateral hypokinesia  Prior to his hospital admission, he works as a Administrator, drove himself to dialysis the day of his hospital admission, then he was discharged to rehab for extended period of time, just came back home around July 27, 2018, continue to show slow improvement, but continued bothered by a large amplitude upper extremity, and the lower extremity tremor, difficulty walking, rely on his walker, difficulty feeding, sometimes food jumping out of his utensil  UPDATE Oct 28 2019: Patient is accompanied by his daughter at today's clinical visit, overall he is doing better, Keppra 250 mg has helped him, he can ambulate with a walker better, but with prolonged walking, such as from parking lot to the office, he would develop bilateral lower extremity large amplitude tremor  He denies significant low back pain, mild bilateral toe numbness, no bowel and bladder incontinence, no hands paresthesia, no upper extremity weakness.  EEG was normal on Oct 28 2019.  Laboratory evaluation 08/14/2019 showed hemoglobin of 10, RDW of 16, creatinine  of 14, potassium of 6.0, sodium 134,  Update February 29, 2020 SS:   REVIEW OF SYSTEMS: Out of a complete 14 system review of symptoms, the patient complains only of the following symptoms, and all other reviewed systems are negative.  ALLERGIES: No Known Allergies  HOME MEDICATIONS: Outpatient Medications Prior to Visit  Medication Sig Dispense Refill  . acetaminophen (TYLENOL) 325 MG tablet Take 650 mg by mouth every 6 (six) hours as needed for mild pain.    Marland Kitchen albuterol (VENTOLIN HFA) 108 (90 Base) MCG/ACT inhaler Inhale 1-2 puffs into the lungs every 6 (six) hours as needed for wheezing or shortness of breath. 6.7 g 0  . allopurinol (ZYLOPRIM) 100 MG tablet Take 1 tablet (100 mg total) by mouth daily. 90 tablet 1  . Amino Acids-Protein Hydrolys (FEEDING SUPPLEMENT, PRO-STAT SUGAR FREE 64,) LIQD Take 30 mLs by mouth 2 (two) times daily. 1688 mL 4  . atorvastatin (LIPITOR) 80 MG tablet Take 1 tablet (80 mg total) by mouth daily. 90 tablet 1  . b complex-vitamin c-folic acid (NEPHRO-VITE) 0.8 MG TABS tablet Take 1 tablet by mouth daily. 30 tablet 0  . bisacodyl (DULCOLAX) 10 MG suppository Place 10 mg rectally daily as needed for moderate constipation (If not relieved by MOM).    . carvedilol (COREG) 6.25 MG tablet TAKE 1 TABLET BY MOUTH TWICE DAILY WITH A MEAL 180 tablet 1  . cetirizine (ZYRTEC) 5 MG tablet Take 1 tablet (5 mg total) by mouth daily. 90 tablet 1  . levETIRAcetam (KEPPRA) 250 MG tablet Take 2 tablets (500 mg total) by mouth 2 (two) times daily. 120 tablet 11  . lidocaine (LIDODERM) 5 % Place 1 patch onto the skin daily. Remove & Discard patch within 12 hours or as directed by MD 30 patch 0  . mirtazapine (REMERON) 15 MG tablet Take 1 tablet (15 mg total) by mouth at bedtime. 90 tablet 1  . sevelamer carbonate (RENVELA) 800 MG tablet Take 1 tablet (800 mg total) by mouth 3 (three) times  daily with meals. 90 tablet 0  . Sodium Phosphates (RA SALINE ENEMA RE) Place 1 each rectally daily as needed (If no BM after Bisacodyl).     No facility-administered medications prior to visit.    PAST MEDICAL HISTORY: Past Medical History:  Diagnosis Date  . CARDIAC ARREST 11/01/2009   Qualifier: Diagnosis of  By: Selena Batten CMA, Jewel    . Colon polyps 01/02/2016   Colonoscopy July 2017 - One 3 mm polyp in the transverse colon, removed with a cold biopsy forceps. Resected and retrieved. - One 3 mm polyp in the rectum, removed with a cold biopsy forceps. Resected and retrieved. - Diverticulosis in the entire examined colon. - Non-bleeding internal hemorrhoids. - The examination was otherwise normal. - Significant looping which prolonged cecal    . Coronary artery disease 08/15/2009   with AMI  . Coronary atherosclerosis 11/01/2009   Qualifier: Diagnosis of  By: Selena Batten CMA, Jewel    . Dialysis patient ([place]) 09/27/2014   mon, wed, and fri  . Diverticulosis of colon without hemorrhage 01/02/2016   Colonoscopy July 2017 - One 3 mm polyp in the transverse colon, removed with a cold biopsy forceps. Resected and retrieved. - One 3 mm polyp in the rectum, removed with a cold biopsy forceps. Resected and retrieved. - Diverticulosis in the entire examined colon. - Non-bleeding internal hemorrhoids. - The examination was otherwise normal. - Significant looping which prolonged cecal    . DM (diabetes  mellitus), type 2 with renal complications ([place]) 25/63/8937  . Essential hypertension 11/01/2009   Qualifier: Diagnosis of  By: Selena Batten CMA, Jewel    . Gout 10/21/2018  . History of acute respiratory distress syndrome (ARDS) due to COVID-19 virus   . History of cardiac arrest 04/27/2019  . History of non-ST elevation myocardial infarction (NSTEMI) 04/18/2019  . history of respiratory arrest   . Hyperlipidemia   . Hypertension   . Mitral valve regurgitation 09/19/2015   Echo 09/2015   . 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 OSA treated with BiPAP 06/17/2010  . Sleep apnea    cpap nightly    PAST SURGICAL HISTORY: Past Surgical History:  Procedure Laterality Date  . AV FISTULA PLACEMENT Left 09/27/2014  . INSERTION OF ARTERIOVENOUS (AV) ARTEGRAFT ARM Left 04/02/2018   Procedure: INSERTION OF ARTERIOVENOUS (AV) ARTEGRAFT ARM LEFT UPPER ARM;  Surgeon: Waynetta Sandy, MD;  Location: [facility];  Service: Vascular;  Laterality: Left;  . INSERTION OF DIALYSIS CATHETER N/A 04/02/2018   Procedure: INSERTION OF DIALYSIS CATHETER, right internal jugular;  Surgeon: Waynetta Sandy, MD;  Location: [facility];  Service: Vascular;  Laterality: N/A;  . NECK SURGERY     C6 & C7 replaced 30 yrs ago per pt  . REVISON OF ARTERIOVENOUS FISTULA Left 04/02/2018   Procedure: REVISION PLICATION OF ARTERIOVENOUS FISTULA ARM;  Surgeon: Waynetta Sandy, MD;  Location: [facility];  Service: Vascular;  Laterality: Left;  . WISDOM TOOTH EXTRACTION      FAMILY HISTORY: Family History  Problem Relation Age of Onset  . Hypertension Mother   . Heart disease Mother 34       cause of death  . Hypertension Father   . Aneurysm Father 52       brain aneurysm  . Heart disease Father 92       cause of death  . Hypertension Brother   . Alcohol abuse Brother   . Diabetes Brother   . Pancreatitis Brother   . Hypertension Brother   . Colon cancer Neg Hx     SOCIAL HISTORY: Social History   Socioeconomic History  . Marital status: Single    Spouse name: Not on file  . Number of children: 1  . Years of education: college  . Highest education level: Not on file  Occupational History  . Not on file  Tobacco Use  . Smoking status: Former Smoker    Packs/day: 1.00    Years: 10.00    Pack years: 10.00    Types: Cigars    Quit date: 06/18/2011    Years since quitting: 8.7  . Smokeless tobacco: Never Used  Vaping Use  . Vaping Use: Never used  Substance and Sexual Activity  . Alcohol use: Yes     Comment: beer or liquor occasionally  . Drug use: No  . Sexual activity: Yes  Other Topics Concern  . Not on file  Social History Narrative   Lives at home with his aunt. His daughter also helps him.   Right-handed.   No daily caffeine use.   Social Determinants of Health   Financial Resource Strain:   . Difficulty of Paying Living Expenses: Not on file  Food Insecurity:   . Worried About Charity fundraiser in the Last Year: Not on file  . Ran Out of Food in the Last Year: Not on file  Transportation Needs:   . Lack of Transportation (Medical): Not on file  . Lack  of Transportation (Non-Medical): Not on file  Physical Activity:   . Days of Exercise per Week: Not on file  . Minutes of Exercise per Session: Not on file  Stress:   . Feeling of Stress : Not on file  Social Connections:   . Frequency of Communication with Friends and Family: Not on file  . Frequency of Social Gatherings with Friends and Family: Not on file  . Attends Religious Services: Not on file  . Active Member of Clubs or Organizations: Not on file  . Attends Archivist Meetings: Not on file  . Marital Status: Not on file  Intimate Partner Violence:   . Fear of Current or Ex-Partner: Not on file  . Emotionally Abused: Not on file  . Physically Abused: Not on file  . Sexually Abused: Not on file      PHYSICAL EXAM  There were no vitals filed for this visit. There is no height or weight on file to calculate BMI.  Generalized: Well developed, in no acute distress   Neurological examination  Mentation: Alert oriented to time, place, history taking. Follows all commands speech and language fluent Cranial nerve II-XII: Pupils were equal round reactive to light. Extraocular movements were full, visual field were full on confrontational test. Facial sensation and strength were normal. Uvula tongue midline. Head turning and shoulder shrug  were normal and symmetric. Motor: The motor testing  reveals 5 over 5 strength of all 4 extremities. Good symmetric motor tone is noted throughout.  Sensory: Sensory testing is intact to soft touch on all 4 extremities. No evidence of extinction is noted.  Coordination: Cerebellar testing reveals good finger-nose-finger and heel-to-shin bilaterally.  Gait and station: Gait is normal. Tandem gait is normal. Romberg is negative. No drift is seen.  Reflexes: Deep tendon reflexes are symmetric and normal bilaterally.   DIAGNOSTIC DATA (LABS, IMAGING, TESTING) - I reviewed patient records, labs, notes, testing and imaging myself where available.  Lab Results  Component Value Date   WBC 6.2 08/14/2019   HGB 10.5 (L) 08/14/2019   HCT 31.0 (L) 08/14/2019   MCV 84.8 08/14/2019   PLT 216 08/14/2019      Component Value Date/Time   NA 134 (L) 08/14/2019 1009   NA 138 08/13/2019 1710   K 6.0 (H) 08/14/2019 1009   CL 99 08/14/2019 1009   CO2 21 (L) 08/14/2019 0924   GLUCOSE 178 (H) 08/14/2019 1009   BUN 91 (H) 08/14/2019 1009   BUN 78 (HH) 08/13/2019 1710   CREATININE 14.20 (H) 08/14/2019 1009   CREATININE 9.06 (H) 08/31/2015 1041   CALCIUM 9.7 08/14/2019 0924   PROT 6.5 06/30/2019 1001   ALBUMIN 2.9 (L) 06/30/2019 1235   AST 20 06/30/2019 1001   ALT 21 06/30/2019 1001   ALKPHOS 77 06/30/2019 1001   BILITOT 0.9 06/30/2019 1001   GFRNONAA 4 (L) 08/14/2019 0924   GFRNONAA 6 (L) 08/31/2015 1041   GFRAA 4 (L) 08/14/2019 0924   GFRAA 7 (L) 08/31/2015 1041   Lab Results  Component Value Date   CHOL 207 (H) 04/14/2019   HDL 20 (L) 04/14/2019   LDLCALC 144 (H) 04/14/2019   TRIG 100 06/27/2019   CHOLHDL 10.4 04/14/2019   Lab Results  Component Value Date   HGBA1C 5.1 06/28/2019   No results found for: [id number] Lab Results  Component Value Date   TSH 1.59 08/31/2015      ASSESSMENT AND PLAN 54 y.o. year old male  has a  past medical history of CARDIAC ARREST (11/01/2009), Colon polyps (01/02/2016), Coronary artery disease  (08/15/2009), Coronary atherosclerosis (11/01/2009), Dialysis patient ([place]) (09/27/2014), Diverticulosis of colon without hemorrhage (01/02/2016), DM (diabetes mellitus), type 2 with renal complications ([place]) (66/44/0347), Essential hypertension (11/01/2009), Gout (10/21/2018), History of acute respiratory distress syndrome (ARDS) due to COVID-19 virus, History of cardiac arrest (04/27/2019), History of non-ST elevation myocardial infarction (NSTEMI) (04/18/2019), history of respiratory arrest, Hyperlipidemia, Hypertension, Mitral valve regurgitation (09/19/2015), 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 here with:  1.  Status post hypoxic injury due to heart attack, COVID-19 pneumonia, ARDS, required prolonged CPR and intubation November 2020 -Large amplitude lower extremity tremor most consistent with myoclonic jerking movement -Upper extremity myoclonic jerking has improved with Keppra -EEG was normal -Myoclonic jerking most consistent with his anoxic brain injury, end-stage renal disease, metabolic toxicity   I spent 15 minutes with the patient. 50% of this time was spent   Butler Denmark, Patterson, DNP 02/29/2020, 5:54 AM [facility] [address] [place], [place] [address] [phone]

## 2020-02-29 NOTE — Therapy (Signed)
[facility] °[address] °[place], [place], [address] °Phone: [phone]   Fax:  [phone] ° °Physical Therapy Therapy Recertification ° °Patient Details  °Name: Troy Wells °MRN: [id number] °Date of Birth: 08/08/1965 °Referring Provider (PT): Dr. Kehinde Eniola ° ° °Encounter Date: 02/29/2020 ° ° PT End of Session - 02/29/20 0950   ° Visit Number 16   ° Number of Visits 32   ° Date for PT Re-Evaluation 04/25/20   ° Authorization Type Medicare, PN 02/08/20, Recert 02/29/20   ° Progress Note Due on Visit 20   ° PT Start Time 0930   ° PT Stop Time 1015   ° PT Time Calculation (min) 45 min   ° Equipment Utilized During Treatment Gait belt   ° Activity Tolerance Patient tolerated treatment well;No increased pain   Tremors in legs with fatigue/balance challenge in SLS  ° Behavior During Therapy WFL for tasks assessed/performed   °  °  °  ° ° °Past Medical History:  °Diagnosis Date  °• CARDIAC ARREST 11/01/2009  ° Qualifier: Diagnosis of  By: Hardy CMA, Jewel    °• Colon polyps 01/02/2016  ° Colonoscopy July 2017 - One 3 mm polyp in the transverse colon, removed with a cold biopsy forceps. Resected and retrieved. - One 3 mm polyp in the rectum, removed with a cold biopsy forceps. Resected and retrieved. - Diverticulosis in the entire examined colon. - Non-bleeding internal hemorrhoids. - The examination was otherwise normal. - Significant looping which prolonged cecal                   °• Coronary artery disease 08/15/2009  ° with AMI  °• Coronary atherosclerosis 11/01/2009  ° Qualifier: Diagnosis of  By: Hardy CMA, Jewel    °• Dialysis patient (HCC) 09/27/2014  ° mon, wed, and fri  °• Diverticulosis of colon without hemorrhage 01/02/2016  ° Colonoscopy July 2017 - One 3 mm polyp in the transverse colon, removed with a cold biopsy forceps. Resected and retrieved. - One 3 mm polyp in the rectum, removed with a cold biopsy forceps. Resected and retrieved. -  Diverticulosis in the entire examined colon. - Non-bleeding internal hemorrhoids. - The examination was otherwise normal. 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  °• OSA treated with BiPAP 06/17/2010  °• Sleep apnea   ° cpap nightly  ° ° °Past Surgical History:  °Procedure Laterality Date  °• AV FISTULA PLACEMENT Left 09/27/2014  °• INSERTION OF ARTERIOVENOUS (AV) ARTEGRAFT ARM Left 04/02/2018  ° Procedure: INSERTION OF ARTERIOVENOUS (AV) ARTEGRAFT ARM LEFT UPPER ARM;  Surgeon: Cain, Brandon Christopher, MD;  [facility];  Service: Vascular;  Laterality: Left;  °• INSERTION OF DIALYSIS CATHETER N/A 04/02/2018  ° Procedure: INSERTION OF DIALYSIS CATHETER, right internal jugular;  Surgeon: Cain, Brandon Christopher, MD;  [facility];  Service: Vascular;  Laterality: N/A;  °• NECK SURGERY    ° C6 & C7 replaced 30 yrs ago per pt  °• REVISON OF ARTERIOVENOUS FISTULA   Left 04/02/2018  ° Procedure: REVISION PLICATION OF ARTERIOVENOUS FISTULA ARM;  Surgeon: Cain, Brandon Christopher, MD;  [facility];  Service: Vascular;  Laterality: Left;  °• WISDOM TOOTH EXTRACTION    ° ° °There were no vitals filed for this visit. ° ° Subjective Assessment - 02/29/20 1311   ° Subjective no new complaints   ° Pertinent History CHF, HTN, ERSD   ° Limitations Standing;Walking;Writing;House hold activities;Lifting   ° How long can you sit comfortably? supported sitting no problem   ° How long  can you stand comfortably? <5 min   ° How long can you walk comfortably? 20 feet with walker   ° Patient Stated Goals improve walking get stronger   °  °  °  ° ° ° ° ° [facility] PT Assessment - 02/29/20 0957   °  ° Berg Balance Test  ° Sit to Stand Able to stand without using hands and stabilize independently   ° Standing Unsupported Able to stand safely 2 minutes   ° Sitting with Back Unsupported but Feet Supported on Floor or Stool Able to sit safely and securely 2 minutes   ° Stand to Sit Sits safely with minimal use of hands   ° Transfers Able to transfer safely, minor use of hands   ° Standing Unsupported with Eyes Closed Able to stand 10 seconds safely   ° Standing Unsupported with Feet Together Needs help to attain position but able to stand for 30 seconds with feet together   ° From Standing, Reach Forward with Outstretched Arm Can reach confidently >25 cm (10")   ° From Standing Position, Pick up Object from Floor Able to pick up shoe safely and easily   ° From Standing Position, Turn to Look Behind Over each Shoulder Looks behind from both sides and weight shifts well   ° Turn 360 Degrees Able to turn 360 degrees safely but slowly   ° Standing Unsupported, Alternately Place Feet on Step/Stool Able to complete >2 steps/needs minimal assist   ° Standing Unsupported, One Foot in Front Able to take small step independently and hold 30 seconds   ° Standing on One Leg Unable to try or needs assist to prevent fall   ° Total Score 42   °  °  °  ° ° ° ° °Nustep: level 5 for 10' °Static standing with moving contralateral foot in front and back in tandem stance: bil UE support: 20x R and L °Standing hip flexion, abduction, extension: red band: 2 x 10 R and L °Stairs: 3 x 4 steps, bil hand rails, alternating steps going up and coming down backwards with step to pattern. °Gait training: 1 x 115' with quad cane, cues for sequencing for 3 point gait pattern.- min A required ° ° ° ° ° ° ° ° ° ° ° ° ° ° ° ° ° ° ° ° ° PT Short  Term Goals - 02/29/20 0954   °  ° PT SHORT TERM GOAL #1  ° Title Patient will be able to ambulate 50 feet with RW with SBA to improve home ambulation   ° Baseline <20 feet with RW min A; 115 ft with supervision/min guard using RW 02/01/2020   ° Time 4   ° Period Weeks   ° Status Achieved   ° Target Date 02/01/20   °  ° PT SHORT TERM GOAL #2  ° Title Patient will be able to maintain partial tandem stance for 15 seconds to improve   static balance    Baseline able to perform 15 seconds, each foot position no UE support 02/01/2020    Time 4    Period Weeks    Status Achieved    Target Date 02/01/20      PT SHORT TERM GOAL #3   Title Patient will be able to go up and don 8 steps with use of one rail with CGA to improve ability to go up and down the stairs at home.    Baseline bilateral rails min assist, difficulty with foot clearance in clinic, 8 steps; at home, still scooting down steps    Time 4    Period Weeks    Status Not Met    Target Date 02/01/20      PT SHORT TERM GOAL #4   Title Patient will only use RW for indoor mobility at home (no wheelchair use) to improve functional mobility    Baseline only uses walker at home with someone in the home; only uses w/c when no one home.    Time 4    Period Weeks    Status Achieved    Target Date 02/01/20      PT SHORT TERM GOAL #5   Title STG=LTG      Additional Short Term Goals   Additional Short Term Goals Yes             PT Long Term Goals - 02/29/20 1311      PT LONG TERM GOAL #1   Title Patient will be able to ambulate >1000 feet with quad cane to improve community ambulation    Baseline 20 feet with RW, 115' with quad cane (02/29/20)    Time 8    Period Weeks    Status Revised    Target Date 04/25/20      PT LONG TERM GOAL #2   Title Patient will be able to perform TUG <25 seconds with RW to improve overall functional mobility    Baseline 36 seconds with RW    Time 8    Period Weeks    Status Revised    Target Date  04/25/20      PT LONG TERM GOAL #3   Title Patient will be able to go up and down 17 steps with use of one rail with sBA to improve ability to go up and down steps    Baseline min A going up and scoots down on his butt; 12 steps with bil hand rail (coming down backward)    Time 8    Period Weeks    Status On-going    Target Date 04/25/20      PT LONG TERM GOAL #4   Title Patient will demo improvement on Berg balance scale to at least 43/56 to improve functional balance and reduce fall risk    Baseline 30/56; 37/56 02/10/2020; 42/56 (02/29/20)    Time 8    Period Weeks    Status Revised    Target Date 04/25/20                 Plan - 02/29/20 0959    Clinical Impression Statement Patient has been seen for total of 16 sessions from 01/04/20 to 02/29/20. Patient has met 3 out of 4 short term goals and making progress towards his long term goals. His long term goals were revised based on his progress thus far. Patient is currently able to go up the steps with use of rail but unable to  come down stairs without scooting on his buttocks.Pt has demonstrated significant improvement in his Berg balance scale score to 42/56 from 30/56 (initial evaluation).  Patient will continue to benefit from skilled PT to progress his ambulation from walker to quad cane or less to improve shor distance ambulation. Patient will benefit from continued skilled PT to work towards his long term functional gaols.    Personal Factors and Comorbidities Comorbidity 3+;Past/Current Experience;Time since onset of injury/illness/exacerbation;Transportation;Fitness;Age    Comorbidities CHF, ESRD, HTN    Examination-Activity Limitations Bathing;Bed Mobility;Caring for Others;Carry;Dressing;Hygiene/Grooming;Lift;Locomotion Level;Self Feeding;Sit;Squat;Stairs;Stand;Toileting;Transfers    Examination-Participation Restrictions Cleaning;Community Activity;Driving;Interpersonal Relationship;Laundry;Medication Management;Meal  Prep;Shop;Yard Work    Stability/Clinical Decision Making Evolving/Moderate complexity    Rehab Potential Good    PT Frequency 2x / week    PT Duration 8 weeks    PT Treatment/Interventions ADLs/Self Care Home Management;Gait training;Stair training;Functional mobility training;Therapeutic activities;Therapeutic exercise;Balance training;Neuromuscular re-education;Patient/family education;Manual techniques;Passive range of motion;Joint Manipulations    PT Next Visit Plan Continue with PWR moves, progress gait to quad cane, work on stairs coming down stairs bwd    [place] and Agree with Plan of Care Patient           Patient will benefit from skilled therapeutic intervention in order to improve the following deficits and impairments:  Abnormal gait, Decreased activity tolerance, Decreased balance, Decreased mobility, Decreased endurance, Decreased coordination, Decreased range of motion, Decreased safety awareness, Decreased strength, Difficulty walking, Impaired flexibility, Postural dysfunction, Impaired UE functional use, Impaired tone  Visit Diagnosis: Unsteadiness on feet  Muscle weakness (generalized)     Problem List Patient Active Problem List   Diagnosis Date Noted   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   Hypoxic brain injury ([facility]) 08/12/2019   Gait abnormality 08/12/2019   Coagulase negative Staphylococcus bacteremia 06/30/2019   History of gout 06/17/2019   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   Pain aggravated by sitting 06/09/2019   Bowel incontinence 06/09/2019   Protein-calorie malnutrition ([place]) 06/01/2019   Weakness acquired in ICU    Anoxic brain injury [facility])    Physical deconditioning    History of cardiac arrest 04/27/2019   History of non-ST elevation myocardial infarction (NSTEMI) 04/18/2019   Pulmonary hypertension ([place]) 04/14/2019   Morbid obesity ([place]) 04/14/2019   Diabetic neuropathy ([facility]) 04/02/2016     Hypertension associated with diabetes ([place]) 10/03/2015   HLD (hyperlipidemia) 09/06/2015   Stage 5 chronic kidney disease on chronic dialysis ([facility]) 08/31/2015   DM (diabetes mellitus), type 2 with renal complications ([place]) 24/23/5361   OSA treated with BiPAP 12/12/2013   Coronary atherosclerosis 11/01/2009    Kerrie Pleasure, PT 02/29/2020, 1:14 PM  [facility] [address] [place], [place], [address] Phone: [phone]   Fax:  [phone]  Name: Troy Wells MRN: [id number] Date of Birth: 09-12-1965

## 2020-03-01 DIAGNOSIS — Z992 Dependence on renal dialysis: Secondary | ICD-10-CM | POA: Diagnosis not present

## 2020-03-01 DIAGNOSIS — E1121 Type 2 diabetes mellitus with diabetic nephropathy: Secondary | ICD-10-CM | POA: Diagnosis not present

## 2020-03-01 DIAGNOSIS — N2581 Secondary hyperparathyroidism of renal origin: Secondary | ICD-10-CM | POA: Diagnosis not present

## 2020-03-01 DIAGNOSIS — N186 End stage renal disease: Secondary | ICD-10-CM | POA: Diagnosis not present

## 2020-03-01 DIAGNOSIS — D631 Anemia in chronic kidney disease: Secondary | ICD-10-CM | POA: Diagnosis not present

## 2020-03-01 NOTE — Addendum Note (Signed)
Addended by: Kerrie Pleasure on: 03/01/2020 10:40 AM   Modules accepted: Orders

## 2020-03-02 ENCOUNTER — Other Ambulatory Visit: Payer: Self-pay

## 2020-03-02 ENCOUNTER — Encounter: Payer: Self-pay | Admitting: Physical Therapy

## 2020-03-02 ENCOUNTER — Ambulatory Visit: Payer: Medicare Other | Admitting: Physical Therapy

## 2020-03-02 DIAGNOSIS — M6281 Muscle weakness (generalized): Secondary | ICD-10-CM | POA: Diagnosis not present

## 2020-03-02 DIAGNOSIS — R2681 Unsteadiness on feet: Secondary | ICD-10-CM | POA: Diagnosis not present

## 2020-03-03 DIAGNOSIS — N2581 Secondary hyperparathyroidism of renal origin: Secondary | ICD-10-CM | POA: Diagnosis not present

## 2020-03-03 DIAGNOSIS — E1121 Type 2 diabetes mellitus with diabetic nephropathy: Secondary | ICD-10-CM | POA: Diagnosis not present

## 2020-03-03 DIAGNOSIS — Z992 Dependence on renal dialysis: Secondary | ICD-10-CM | POA: Diagnosis not present

## 2020-03-03 DIAGNOSIS — N186 End stage renal disease: Secondary | ICD-10-CM | POA: Diagnosis not present

## 2020-03-03 DIAGNOSIS — D631 Anemia in chronic kidney disease: Secondary | ICD-10-CM | POA: Diagnosis not present

## 2020-03-03 NOTE — Therapy (Signed)
[facility] [address] [place], [place], [address] Phone: [phone]   Fax:  [phone]  Physical Therapy Treatment  Patient Details  Name: Balraj Brayfield MRN: [id number] Date of Birth: 1965-12-28 Referring Provider (PT): Dr. Andrena Mews   Encounter Date: 03/02/2020   PT End of Session - 03/02/20 0850    Visit Number 17    Number of Visits 32    Date for PT Re-Evaluation 04/25/20    Authorization Type Medicare, PN 4/00/86, Recert 7/61/95    Progress Note Due on Visit 20    PT Start Time 0848    PT Stop Time 0928    PT Time Calculation (min) 40 min    Equipment Utilized During Treatment Gait belt    Activity Tolerance Patient tolerated treatment well;No increased pain    Behavior During Therapy WFL for tasks assessed/performed           Past Medical History:  Diagnosis Date  . CARDIAC ARREST 11/01/2009   Qualifier: Diagnosis of  By: Selena Batten CMA, Jewel    . Colon polyps 01/02/2016   Colonoscopy July 2017 - One 3 mm polyp in the transverse colon, removed with a cold biopsy forceps. Resected and retrieved. - One 3 mm polyp in the rectum, removed with a cold biopsy forceps. Resected and retrieved. - Diverticulosis in the entire examined colon. - Non-bleeding internal hemorrhoids. - The examination was otherwise normal. - Significant looping which prolonged cecal    . Coronary artery disease 08/15/2009   with AMI  . Coronary atherosclerosis 11/01/2009   Qualifier: Diagnosis of  By: Selena Batten CMA, Jewel    . Dialysis patient ([facility]) 09/27/2014   mon, wed, and fri  . Diverticulosis of colon without hemorrhage 01/02/2016   Colonoscopy July 2017 - One 3 mm polyp in the transverse colon, removed with a cold biopsy forceps. Resected and retrieved. - One 3 mm polyp in the rectum, removed with a cold biopsy forceps. Resected and retrieved. - Diverticulosis in the entire examined colon. - Non-bleeding internal  hemorrhoids. - The examination was otherwise normal. - Significant looping which prolonged cecal    . DM (diabetes mellitus), type 2 with renal complications ([place]) 09/32/6712  . Essential hypertension 11/01/2009   Qualifier: Diagnosis of  By: Selena Batten CMA, Jewel    . Gout 10/21/2018  . History of acute respiratory distress syndrome (ARDS) due to COVID-19 virus   . History of cardiac arrest 04/27/2019  . History of non-ST elevation myocardial infarction (NSTEMI) 04/18/2019  . history of respiratory arrest   . Hyperlipidemia   . Hypertension   . Mitral valve regurgitation 09/19/2015   Echo 09/2015   . 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 OSA treated with BiPAP 06/17/2010  . Sleep apnea    cpap nightly    Past Surgical History:  Procedure Laterality Date  . AV FISTULA PLACEMENT Left 09/27/2014  . INSERTION OF ARTERIOVENOUS (AV) ARTEGRAFT ARM Left 04/02/2018   Procedure: INSERTION OF ARTERIOVENOUS (AV) ARTEGRAFT ARM LEFT UPPER ARM;  Surgeon: Waynetta Sandy, MD;  Location: [facility];  Service: Vascular;  Laterality: Left;  . INSERTION OF DIALYSIS CATHETER N/A 04/02/2018   Procedure: INSERTION OF DIALYSIS CATHETER, right internal jugular;  Surgeon: Waynetta Sandy, MD;  Location: [facility];  Service: Vascular;  Laterality: N/A;  . NECK SURGERY     C6 & C7 replaced 30 yrs ago per pt  . REVISON OF ARTERIOVENOUS FISTULA Left 04/02/2018   Procedure: REVISION PLICATION OF ARTERIOVENOUS FISTULA  ARM;  Surgeon: Waynetta Sandy, MD;  Location: [facility];  Service: Vascular;  Laterality: Left;  . WISDOM TOOTH EXTRACTION      There were no vitals filed for this visit.   Subjective Assessment - 03/02/20 0849    Subjective No new complatins. No falls or pain to report.    Pertinent History CHF, HTN, ERSD    Limitations Standing;Walking;Writing;House hold activities;Lifting    How long can you sit comfortably? supported sitting no problem    How long can you stand comfortably? <5 min     How long can you walk comfortably? 20 feet with walker    Patient Stated Goals improve walking get stronger    Currently in Pain? No/denies                [facility] Adult PT Treatment/Exercise - 03/02/20 0852      Transfers   Transfers Sit to Stand;Stand to Sit    Sit to Stand 5: Supervision;With upper extremity assist;From chair/3-in-1;From bed    Stand to Sit 5: Supervision;With upper extremity assist;To chair/3-in-1;To bed      Ambulation/Gait   Ambulation/Gait Yes    Ambulation/Gait Assistance 4: Min guard    Ambulation/Gait Assistance Details cues on sequencing and tehcnique. no balance loss noted. cues for upright posture as well.     Ambulation Distance (Feet) 40 Feet   x2   Assistive device Straight cane   with rubber quad tip   Gait Pattern Decreased stride length;Decreased trunk rotation;Trunk flexed;Wide base of support;Poor foot clearance - left;Poor foot clearance - right    Ambulation Surface Level;Indoor    Stairs Yes    Stairs Assistance 4: Min guard;4: Min assist    Stairs Assistance Details (indicate cue type and reason) had pt holding right rail with both hands pt ascended stairs in partial sideways leading with right LE, then descended in partial sideways with both hands on same rail leading with right LE. min guard assist for safety to ascend, min assist x1 with descent for balance assist.     Stair Management Technique One rail Right;Step to pattern;Sideways    Number of Stairs 4    Height of Stairs 6      Self-Care   Self-Care Other Self-Care Comments    Other Self-Care Comments  discussed increasing walking to include laps in driveway with RW to increase his activity levels; discussed getting a cane and working on gait with cane at [employer] [facility]) - 03/02/20 0906    PWR! Up 10    PWR! Rock 10    PWR! Twist 10    PWR! Step 10    Comments visual and verbal cues on form and technique                 PT Short Term Goals -  03/01/20 1027      PT SHORT TERM GOAL #1   Title Pt will be able to ambulate 500 feet with quad cane with SBA to improve short term ambulation (all STG to be met by 03/29/20)    Baseline 115' with quad cane (02/29/20)    Time 4    Period Weeks    Status Achieved    Target Date 03/29/20      PT SHORT TERM GOAL #2   Title Patient will be able to come down steps facing forward to improve ability to negotiate 12 steps with bil  rails    Baseline comes down backwards or scoots on buttocks (02/29/20)    Time 4    Period Weeks    Status Revised    Target Date 03/29/20      PT SHORT TERM GOAL #3   Title --    Baseline --    Time --    Period --    Status --    Target Date --      PT SHORT TERM GOAL #4   Title --    Baseline --    Time --    Period --    Status --    Target Date --      PT SHORT TERM GOAL #5   Title --             PT Long Term Goals - 02/29/20 1311      PT LONG TERM GOAL #1   Title Patient will be able to ambulate >1000 feet with quad cane to improve community ambulation    Baseline 20 feet with RW, 115' with quad cane (02/29/20)    Time 8    Period Weeks    Status Revised    Target Date 04/25/20      PT LONG TERM GOAL #2   Title Patient will be able to perform TUG <25 seconds with RW to improve overall functional mobility    Baseline 36 seconds with RW    Time 8    Period Weeks    Status Revised    Target Date 04/25/20      PT LONG TERM GOAL #3   Title Patient will be able to go up and down 17 steps with use of one rail with sBA to improve ability to go up and down steps    Baseline min A going up and scoots down on his butt; 12 steps with bil hand rail (coming down backward)    Time 8    Period Weeks    Status On-going    Target Date 04/25/20      PT LONG TERM GOAL #4   Title Patient will demo improvement on Berg balance scale to at least 43/56 to improve functional balance and reduce fall risk    Baseline 30/56; 37/56 02/10/2020; 42/56  (02/29/20)    Time 8    Period Weeks    Status Revised    Target Date 04/25/20                 Plan - 03/02/20 0850    Clinical Impression Statement Today's skilled session continued to focus on gait with cane for short distances, stair negotiation and seated PWR! ex's with rest breaks taken as needed. On stairs the safety and most effective technique for pt to use with single rail was partially sideways with both hands on same rail. This allowed him to still lead with stronger leg up and weaker leg down the stairs. The pt is making steady progress and should benefit from continued PT to progress toward unmet goals.    Personal Factors and Comorbidities Comorbidity 3+;Past/Current Experience;Time since onset of injury/illness/exacerbation;Transportation;Fitness;Age    Comorbidities CHF, ESRD, HTN    Examination-Activity Limitations Bathing;Bed Mobility;Caring for Others;Carry;Dressing;Hygiene/Grooming;Lift;Locomotion Level;Self Feeding;Sit;Squat;Stairs;Stand;Toileting;Transfers    Examination-Participation Restrictions Cleaning;Community Activity;Driving;Interpersonal Relationship;Laundry;Medication Management;Meal Prep;Shop;Yard Work    Stability/Clinical Decision Making Evolving/Moderate complexity    Rehab Potential Good    PT Frequency 2x / week    PT Duration 8 weeks    PT Treatment/Interventions  ADLs/Self Care Home Management;Gait training;Stair training;Functional mobility training;Therapeutic activities;Therapeutic exercise;Balance training;Neuromuscular re-education;Patient/family education;Manual techniques;Passive range of motion;Joint Manipulations    PT Next Visit Plan Continue with PWR moves, progress gait with straight cane with rubber quad tip, continue to  work on stairs    PT [place] and Agree with Plan of Care Patient           Patient will benefit from skilled therapeutic intervention in order to improve the following  deficits and impairments:  Abnormal gait, Decreased activity tolerance, Decreased balance, Decreased mobility, Decreased endurance, Decreased coordination, Decreased range of motion, Decreased safety awareness, Decreased strength, Difficulty walking, Impaired flexibility, Postural dysfunction, Impaired UE functional use, Impaired tone  Visit Diagnosis: Unsteadiness on feet  Muscle weakness (generalized)     Problem List Patient Active Problem List   Diagnosis Date Noted  . 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 Hypoxic brain injury ([place]) 08/12/2019  . Gait abnormality 08/12/2019  . Coagulase negative Staphylococcus bacteremia 06/30/2019  . History of gout 06/17/2019  . 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 Pain aggravated by sitting 06/09/2019  . Bowel incontinence 06/09/2019  . Protein-calorie malnutrition ([place]) 06/01/2019  . Weakness acquired in ICU   . Anoxic brain injury ([place])   . Physical deconditioning   . History of cardiac arrest 04/27/2019  . History of non-ST elevation myocardial infarction (NSTEMI) 04/18/2019  . Pulmonary hypertension ([place]) 04/14/2019  . Morbid obesity ([place]) 04/14/2019  . Diabetic neuropathy ([place]) 04/02/2016  . Hypertension associated with diabetes ([place]) 10/03/2015  . HLD (hyperlipidemia) 09/06/2015  . Stage 5 chronic kidney disease on chronic dialysis ([place]) 08/31/2015  . DM (diabetes mellitus), type 2 with renal complications ([place]) 03/49/1791  . OSA treated with BiPAP 12/12/2013  . Coronary atherosclerosis 11/01/2009    Willow Ora, PTA, [facility] [address] [place], [place] [address] [phone] 03/03/20, 3:55 PM   Name: Kethan Papadopoulos MRN: [id number] Date of Birth: 1966-03-27

## 2020-03-06 ENCOUNTER — Ambulatory Visit: Payer: Medicare Other | Admitting: Physical Therapy

## 2020-03-06 DIAGNOSIS — N186 End stage renal disease: Secondary | ICD-10-CM | POA: Diagnosis not present

## 2020-03-06 DIAGNOSIS — E1121 Type 2 diabetes mellitus with diabetic nephropathy: Secondary | ICD-10-CM | POA: Diagnosis not present

## 2020-03-06 DIAGNOSIS — D631 Anemia in chronic kidney disease: Secondary | ICD-10-CM | POA: Diagnosis not present

## 2020-03-06 DIAGNOSIS — N2581 Secondary hyperparathyroidism of renal origin: Secondary | ICD-10-CM | POA: Diagnosis not present

## 2020-03-06 DIAGNOSIS — Z992 Dependence on renal dialysis: Secondary | ICD-10-CM | POA: Diagnosis not present

## 2020-03-07 ENCOUNTER — Other Ambulatory Visit: Payer: Self-pay

## 2020-03-07 ENCOUNTER — Encounter: Payer: Self-pay | Admitting: Physical Therapy

## 2020-03-07 ENCOUNTER — Ambulatory Visit: Payer: Medicare Other | Admitting: Physical Therapy

## 2020-03-07 ENCOUNTER — Ambulatory Visit: Payer: Medicare Other

## 2020-03-07 DIAGNOSIS — M6281 Muscle weakness (generalized): Secondary | ICD-10-CM | POA: Diagnosis not present

## 2020-03-07 DIAGNOSIS — R2681 Unsteadiness on feet: Secondary | ICD-10-CM | POA: Diagnosis not present

## 2020-03-07 NOTE — Therapy (Signed)
[facility] [address] [place], [place], [address] Phone: [phone]   Fax:  [phone]  Physical Therapy Treatment  Patient Details  Name: Troy Wells MRN: [id number] Date of Birth: April 01, 1966 Referring Provider (PT): Dr. Andrena Mews   Encounter Date: 03/07/2020   PT End of Session - 03/07/20 0720    Visit Number 18    Number of Visits 32    Date for PT Re-Evaluation 04/25/20    Authorization Type Medicare, PN 4/62/70, Recert 3/50/09    Progress Note Due on Visit 20    PT Start Time 0716    PT Stop Time 0755    PT Time Calculation (min) 39 min    Equipment Utilized During Treatment Gait belt    Activity Tolerance Patient tolerated treatment well;No increased pain    Behavior During Therapy WFL for tasks assessed/performed           Past Medical History:  Diagnosis Date  . CARDIAC ARREST 11/01/2009   Qualifier: Diagnosis of  By: Selena Batten CMA, Jewel    . Colon polyps 01/02/2016   Colonoscopy July 2017 - One 3 mm polyp in the transverse colon, removed with a cold biopsy forceps. Resected and retrieved. - One 3 mm polyp in the rectum, removed with a cold biopsy forceps. Resected and retrieved. - Diverticulosis in the entire examined colon. - Non-bleeding internal hemorrhoids. - The examination was otherwise normal. - Significant looping which prolonged cecal    . Coronary artery disease 08/15/2009   with AMI  . Coronary atherosclerosis 11/01/2009   Qualifier: Diagnosis of  By: Selena Batten CMA, Jewel    . Dialysis patient ([facility]) 09/27/2014   mon, wed, and fri  . Diverticulosis of colon without hemorrhage 01/02/2016   Colonoscopy July 2017 - One 3 mm polyp in the transverse colon, removed with a cold biopsy forceps. Resected and retrieved. - One 3 mm polyp in the rectum, removed with a cold biopsy forceps. Resected and retrieved. - Diverticulosis in the entire examined colon. - Non-bleeding internal  hemorrhoids. - The examination was otherwise normal. - Significant looping which prolonged cecal    . DM (diabetes mellitus), type 2 with renal complications ([facility]) 38/18/2993  . Essential hypertension 11/01/2009   Qualifier: Diagnosis of  By: Selena Batten CMA, Jewel    . Gout 10/21/2018  . History of acute respiratory distress syndrome (ARDS) due to COVID-19 virus   . History of cardiac arrest 04/27/2019  . History of non-ST elevation myocardial infarction (NSTEMI) 04/18/2019  . history of respiratory arrest   . Hyperlipidemia   . Hypertension   . Mitral valve regurgitation 09/19/2015   Echo 09/2015   . 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 OSA treated with BiPAP 06/17/2010  . Sleep apnea    cpap nightly    Past Surgical History:  Procedure Laterality Date  . AV FISTULA PLACEMENT Left 09/27/2014  . INSERTION OF ARTERIOVENOUS (AV) ARTEGRAFT ARM Left 04/02/2018   Procedure: INSERTION OF ARTERIOVENOUS (AV) ARTEGRAFT ARM LEFT UPPER ARM;  Surgeon: Waynetta Sandy, MD;  Location: [facility];  Service: Vascular;  Laterality: Left;  . INSERTION OF DIALYSIS CATHETER N/A 04/02/2018   Procedure: INSERTION OF DIALYSIS CATHETER, right internal jugular;  Surgeon: Waynetta Sandy, MD;  Location: [facility];  Service: Vascular;  Laterality: N/A;  . NECK SURGERY     C6 & C7 replaced 30 yrs ago per pt  . REVISON OF ARTERIOVENOUS FISTULA Left 04/02/2018   Procedure: REVISION PLICATION OF ARTERIOVENOUS FISTULA  ARM;  Surgeon: Waynetta Sandy, MD;  Location: [facility];  Service: Vascular;  Laterality: Left;  . WISDOM TOOTH EXTRACTION      There were no vitals filed for this visit.   Subjective Assessment - 03/07/20 0720    Subjective No new complatins. No falls or pain to report. Reports his aunt bought him a cane, not sure what type she got.    Pertinent History CHF, HTN, ERSD    Limitations Standing;Walking;Writing;House hold activities;Lifting    How long can you sit comfortably? supported sitting  no problem    How long can you stand comfortably? <5 min    How long can you walk comfortably? 20 feet with walker    Patient Stated Goals improve walking get stronger    Currently in Pain? No/denies                [facility] Adult PT Treatment/Exercise - 03/07/20 0721      Transfers   Transfers Sit to Stand;Stand to Sit    Sit to Stand 5: Supervision;With upper extremity assist;From chair/3-in-1;From bed    Stand to Sit 5: Supervision;With upper extremity assist;To chair/3-in-1;To bed      Ambulation/Gait   Ambulation/Gait Yes    Ambulation/Gait Assistance 4: Min guard    Ambulation/Gait Assistance Details cues on posture, sequencing and step length with gait.     Ambulation Distance (Feet) 40 Feet   x1, 45 x1   Assistive device Small based quad cane    Gait Pattern Decreased stride length;Decreased trunk rotation;Trunk flexed;Wide base of support;Poor foot clearance - left;Poor foot clearance - right    Ambulation Surface Level;Indoor    Stairs Yes    Stairs Assistance 4: Min guard    Stairs Assistance Details (indicate cue type and reason) pt in partial sideways position with both hands holding onto right rail- ascending with right LE first, descending with right LE first. increased time needed with descending last 2 steps due to increased tremors in LE's.     Stair Management Technique One rail Right;Step to pattern;Sideways    Number of Stairs 4    Height of Stairs 6             [facility]) - 03/07/20 1224    PWR! Up 15    PWR! Rock 15    PWR! Twist 15    PWR Step 15    Basic 4 Flow 15    Comments Modified in standing to have UE support as needed for balance assistance                 PT Short Term Goals - 03/01/20 1027      PT SHORT TERM GOAL #1   Title Pt will be able to ambulate 500 feet with quad cane with SBA to improve short term ambulation (all STG to be met by 03/29/20)    Baseline 115' with quad cane (02/29/20)    Time 4    Period Weeks    Status  Achieved    Target Date 03/29/20      PT SHORT TERM GOAL #2   Title Patient will be able to come down steps facing forward to improve ability to negotiate 12 steps with bil rails    Baseline comes down backwards or scoots on buttocks (02/29/20)    Time 4    Period Weeks    Status Revised    Target Date 03/29/20      PT SHORT TERM GOAL #  3   Title --    Baseline --    Time --    Period --    Status --    Target Date --      PT SHORT TERM GOAL #4   Title --    Baseline --    Time --    Period --    Status --    Target Date --      PT SHORT TERM GOAL #5   Title --             PT Long Term Goals - 02/29/20 1311      PT LONG TERM GOAL #1   Title Patient will be able to ambulate >1000 feet with quad cane to improve community ambulation    Baseline 20 feet with RW, 115' with quad cane (02/29/20)    Time 8    Period Weeks    Status Revised    Target Date 04/25/20      PT LONG TERM GOAL #2   Title Patient will be able to perform TUG <25 seconds with RW to improve overall functional mobility    Baseline 36 seconds with RW    Time 8    Period Weeks    Status Revised    Target Date 04/25/20      PT LONG TERM GOAL #3   Title Patient will be able to go up and down 17 steps with use of one rail with sBA to improve ability to go up and down steps    Baseline min A going up and scoots down on his butt; 12 steps with bil hand rail (coming down backward)    Time 8    Period Weeks    Status On-going    Target Date 04/25/20      PT LONG TERM GOAL #4   Title Patient will demo improvement on Berg balance scale to at least 43/56 to improve functional balance and reduce fall risk    Baseline 30/56; 37/56 02/10/2020; 42/56 (02/29/20)    Time 8    Period Weeks    Status Revised    Target Date 04/25/20                 Plan - 03/07/20 0721    Clinical Impression Statement Today's skilled session continued to focus on gait with cane, stair negotiation and use of PWR! moves  ex's to address strengthening and emphasize bigger movements with LE tremors. Changed over to use of small based quad cane this session due to this sounds like the style his aunt bought him. Rest breaks needed through out session due to fatigue. The pt is progressing toward goals and should benefit from continued PT to progress toward unmet goals.    Personal Factors and Comorbidities Comorbidity 3+;Past/Current Experience;Time since onset of injury/illness/exacerbation;Transportation;Fitness;Age    Comorbidities CHF, ESRD, HTN    Examination-Activity Limitations Bathing;Bed Mobility;Caring for Others;Carry;Dressing;Hygiene/Grooming;Lift;Locomotion Level;Self Feeding;Sit;Squat;Stairs;Stand;Toileting;Transfers    Examination-Participation Restrictions Cleaning;Community Activity;Driving;Interpersonal Relationship;Laundry;Medication Management;Meal Prep;Shop;Yard Work    Stability/Clinical Decision Making Evolving/Moderate complexity    Rehab Potential Good    PT Frequency 2x / week    PT Duration 8 weeks    PT Treatment/Interventions ADLs/Self Care Home Management;Gait training;Stair training;Functional mobility training;Therapeutic activities;Therapeutic exercise;Balance training;Neuromuscular re-education;Patient/family education;Manual techniques;Passive range of motion;Joint Manipulations    PT Next Visit Plan Continue with PWR moves, progress gait with straight cane with rubber quad tip vs small based quad cane as this may be what his  aunt bought for him, continue to  work on stairs    PT Divide and Agree with Plan of Care Patient           Patient will benefit from skilled therapeutic intervention in order to improve the following deficits and impairments:  Abnormal gait, Decreased activity tolerance, Decreased balance, Decreased mobility, Decreased endurance, Decreased coordination, Decreased range of motion, Decreased safety awareness, Decreased  strength, Difficulty walking, Impaired flexibility, Postural dysfunction, Impaired UE functional use, Impaired tone  Visit Diagnosis: Unsteadiness on feet  Muscle weakness (generalized)     Problem List Patient Active Problem List   Diagnosis Date Noted  . 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 Hypoxic brain injury ([place]) 08/12/2019  . Gait abnormality 08/12/2019  . Coagulase negative Staphylococcus bacteremia 06/30/2019  . History of gout 06/17/2019  . 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 Pain aggravated by sitting 06/09/2019  . Bowel incontinence 06/09/2019  . Protein-calorie malnutrition ([place]) 06/01/2019  . Weakness acquired in ICU   . Anoxic brain injury ([place])   . Physical deconditioning   . History of cardiac arrest 04/27/2019  . History of non-ST elevation myocardial infarction (NSTEMI) 04/18/2019  . Pulmonary hypertension ([facility]) 04/14/2019  . Morbid obesity ([place]) 04/14/2019  . Diabetic neuropathy ([facility]) 04/02/2016  . Hypertension associated with diabetes ([place]) 10/03/2015  . HLD (hyperlipidemia) 09/06/2015  . Stage 5 chronic kidney disease on chronic dialysis ([facility]) 08/31/2015  . DM (diabetes mellitus), type 2 with renal complications ([facility]) 17/92/1783  . OSA treated with BiPAP 12/12/2013  . Coronary atherosclerosis 11/01/2009    Willow Ora, PTA, [facility] [address] [place], [place] [address] [phone] 03/07/20, 9:59 AM   Name: Jorden Minchey MRN: [id number] Date of Birth: Aug 29, 1965

## 2020-03-08 ENCOUNTER — Ambulatory Visit: Payer: Medicare Other | Admitting: Physical Therapy

## 2020-03-08 DIAGNOSIS — Z992 Dependence on renal dialysis: Secondary | ICD-10-CM | POA: Diagnosis not present

## 2020-03-08 DIAGNOSIS — E1121 Type 2 diabetes mellitus with diabetic nephropathy: Secondary | ICD-10-CM | POA: Diagnosis not present

## 2020-03-08 DIAGNOSIS — D631 Anemia in chronic kidney disease: Secondary | ICD-10-CM | POA: Diagnosis not present

## 2020-03-08 DIAGNOSIS — N2581 Secondary hyperparathyroidism of renal origin: Secondary | ICD-10-CM | POA: Diagnosis not present

## 2020-03-08 DIAGNOSIS — N186 End stage renal disease: Secondary | ICD-10-CM | POA: Diagnosis not present

## 2020-03-09 ENCOUNTER — Other Ambulatory Visit: Payer: Self-pay | Admitting: Family Medicine

## 2020-03-09 ENCOUNTER — Ambulatory Visit: Payer: Medicare Other

## 2020-03-09 ENCOUNTER — Other Ambulatory Visit: Payer: Self-pay

## 2020-03-09 DIAGNOSIS — R2681 Unsteadiness on feet: Secondary | ICD-10-CM

## 2020-03-09 DIAGNOSIS — M6281 Muscle weakness (generalized): Secondary | ICD-10-CM

## 2020-03-09 DIAGNOSIS — E782 Mixed hyperlipidemia: Secondary | ICD-10-CM

## 2020-03-09 NOTE — Therapy (Signed)
[facility] [address] [place], [place], [address] Phone: [phone]   Fax:  [phone]  Physical Therapy Treatment  Patient Details  Name: Troy Wells MRN: [id number] Date of Birth: 22-Sep-1965 Referring Provider (PT): Dr. Andrena Mews   Encounter Date: 03/09/2020   PT End of Session - 03/09/20 0859    Visit Number 19    Number of Visits 32    Date for PT Re-Evaluation 04/25/20    Authorization Type Medicare, PN 12/20/21, Recert 7/62/83    Authorization - Visit Number 12    Progress Note Due on Visit 20    PT Start Time 0845    PT Stop Time 0930    PT Time Calculation (min) 45 min    Equipment Utilized During Treatment Gait belt    Activity Tolerance Patient tolerated treatment well;No increased pain    Behavior During Therapy WFL for tasks assessed/performed           Past Medical History:  Diagnosis Date  . CARDIAC ARREST 11/01/2009   Qualifier: Diagnosis of  By: Selena Batten CMA, Jewel    . Colon polyps 01/02/2016   Colonoscopy July 2017 - One 3 mm polyp in the transverse colon, removed with a cold biopsy forceps. Resected and retrieved. - One 3 mm polyp in the rectum, removed with a cold biopsy forceps. Resected and retrieved. - Diverticulosis in the entire examined colon. - Non-bleeding internal hemorrhoids. - The examination was otherwise normal. - Significant looping which prolonged cecal    . Coronary artery disease 08/15/2009   with AMI  . Coronary atherosclerosis 11/01/2009   Qualifier: Diagnosis of  By: Selena Batten CMA, Jewel    . Dialysis patient ([facility]) 09/27/2014   mon, wed, and fri  . Diverticulosis of colon without hemorrhage 01/02/2016   Colonoscopy July 2017 - One 3 mm polyp in the transverse colon, removed with a cold biopsy forceps. Resected and retrieved. - One 3 mm polyp in the rectum, removed with a cold biopsy forceps. Resected and retrieved. - Diverticulosis in the entire  examined colon. - Non-bleeding internal hemorrhoids. - The examination was otherwise normal. - Significant looping which prolonged cecal    . DM (diabetes mellitus), type 2 with renal complications ([facility]) 15/17/6160  . Essential hypertension 11/01/2009   Qualifier: Diagnosis of  By: Selena Batten CMA, Jewel    . Gout 10/21/2018  . History of acute respiratory distress syndrome (ARDS) due to COVID-19 virus   . History of cardiac arrest 04/27/2019  . History of non-ST elevation myocardial infarction (NSTEMI) 04/18/2019  . history of respiratory arrest   . Hyperlipidemia   . Hypertension   . Mitral valve regurgitation 09/19/2015   Echo 09/2015   . 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 OSA treated with BiPAP 06/17/2010  . Sleep apnea    cpap nightly    Past Surgical History:  Procedure Laterality Date  . AV FISTULA PLACEMENT Left 09/27/2014  . INSERTION OF ARTERIOVENOUS (AV) ARTEGRAFT ARM Left 04/02/2018   Procedure: INSERTION OF ARTERIOVENOUS (AV) ARTEGRAFT ARM LEFT UPPER ARM;  Surgeon: Waynetta Sandy, MD;  Location: [facility];  Service: Vascular;  Laterality: Left;  . INSERTION OF DIALYSIS CATHETER N/A 04/02/2018   Procedure: INSERTION OF DIALYSIS CATHETER, right internal jugular;  Surgeon: Waynetta Sandy, MD;  Location: [facility];  Service: Vascular;  Laterality: N/A;  . NECK SURGERY     C6 & C7 replaced 30 yrs ago per pt  . REVISON OF ARTERIOVENOUS FISTULA Left 04/02/2018  Procedure: REVISION PLICATION OF ARTERIOVENOUS FISTULA ARM;  Surgeon: Waynetta Sandy, MD;  Location: [facility];  Service: Vascular;  Laterality: Left;  . WISDOM TOOTH EXTRACTION      There were no vitals filed for this visit.   Subjective Assessment - 03/09/20 0852    Subjective Pt reports he hasn't gottent the cane. His brother was supposed to drop it off to therapy today. Pt reports his daughters house has 14 steps without landing. He got to the top of the steps and because there wasnt any railing at top of  the steps, his legs started tremoring and couldn't take the last step to get on the second floor. He sat down and scooted back down.    Pertinent History CHF, HTN, ERSD    Limitations Standing;Walking;Writing;House hold activities;Lifting    How long can you sit comfortably? supported sitting no problem    How long can you stand comfortably? <5 min    How long can you walk comfortably? 20 feet with walker    Patient Stated Goals improve walking get stronger            Nu-step: level 3 for 10' Gait training: 1 x 207' with quad cane CGA (wheelchair follow for 1st round for just in case if patient needed to rest); 2 x 207' with [place] cane CGA (pt felt tremors for first 25% of 207' during the 2nd lap but felt fine rest of the way) Stairs: going fwd and coming down bwd: 16 steps with bil hand rail, SBA, increased tremors noted with steps but pt able to complete 16 steps without assistance. Pt only has one rail at his home and daughter's home                         PT Education - 03/09/20 0857    Education Details Pt educated that his daughter can have the walker ready at top of the steps so he can use that as support at top of the steps. Pt educated on also trying to practice bwd coming down steps.    Person(s) Educated Patient    Methods Explanation    Comprehension Verbalized understanding            PT Short Term Goals - 03/01/20 1027      PT SHORT TERM GOAL #1   Title Pt will be able to ambulate 500 feet with quad cane with SBA to improve short term ambulation (all STG to be met by 03/29/20)    Baseline 115' with quad cane (02/29/20)    Time 4    Period Weeks    Status Achieved    Target Date 03/29/20      PT SHORT TERM GOAL #2   Title Patient will be able to come down steps facing forward to improve ability to negotiate 12 steps with bil rails    Baseline comes down backwards or scoots on buttocks (02/29/20)    Time 4    Period Weeks    Status  Revised    Target Date 03/29/20      PT SHORT TERM GOAL #3   Title --    Baseline --    Time --    Period --    Status --    Target Date --      PT SHORT TERM GOAL #4   Title --    Baseline --    Time --    Period --  Status --    Target Date --      PT SHORT TERM GOAL #5   Title --             PT Long Term Goals - 02/29/20 1311      PT LONG TERM GOAL #1   Title Patient will be able to ambulate >1000 feet with quad cane to improve community ambulation    Baseline 20 feet with RW, 115' with quad cane (02/29/20)    Time 8    Period Weeks    Status Revised    Target Date 04/25/20      PT LONG TERM GOAL #2   Title Patient will be able to perform TUG <25 seconds with RW to improve overall functional mobility    Baseline 36 seconds with RW    Time 8    Period Weeks    Status Revised    Target Date 04/25/20      PT LONG TERM GOAL #3   Title Patient will be able to go up and down 17 steps with use of one rail with sBA to improve ability to go up and down steps    Baseline min A going up and scoots down on his butt; 12 steps with bil hand rail (coming down backward)    Time 8    Period Weeks    Status On-going    Target Date 04/25/20      PT LONG TERM GOAL #4   Title Patient will demo improvement on Berg balance scale to at least 43/56 to improve functional balance and reduce fall risk    Baseline 30/56; 37/56 02/10/2020; 42/56 (02/29/20)    Time 8    Period Weeks    Status Revised    Target Date 04/25/20                  Patient will benefit from skilled therapeutic intervention in order to improve the following deficits and impairments:     Visit Diagnosis: Unsteadiness on feet  Muscle weakness (generalized)     Problem List Patient Active Problem List   Diagnosis Date Noted  . 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 Hypoxic brain injury ([place]) 08/12/2019  . Gait abnormality 08/12/2019  . Coagulase negative Staphylococcus bacteremia 06/30/2019   . History of gout 06/17/2019  . 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 Pain aggravated by sitting 06/09/2019  . Bowel incontinence 06/09/2019  . Protein-calorie malnutrition ([place]) 06/01/2019  . Weakness acquired in ICU   . Anoxic brain injury ([place])   . Physical deconditioning   . History of cardiac arrest 04/27/2019  . History of non-ST elevation myocardial infarction (NSTEMI) 04/18/2019  . Pulmonary hypertension ([place]) 04/14/2019  . Morbid obesity ([place]) 04/14/2019  . Diabetic neuropathy ([place]) 04/02/2016  . Hypertension associated with diabetes ([facility]) 10/03/2015  . HLD (hyperlipidemia) 09/06/2015  . 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 DM (diabetes mellitus), type 2 with renal complications ([place]) 38/75/6433  . OSA treated with BiPAP 12/12/2013  . Coronary atherosclerosis 11/01/2009    Kerrie Pleasure, PT 03/09/2020, 9:00 AM  [facility] [address] [place], [place], [address] Phone: [phone]   Fax:  [phone]  Name: Troy Wells MRN: [id number] Date of Birth: 11-25-1965

## 2020-03-10 DIAGNOSIS — E1121 Type 2 diabetes mellitus with diabetic nephropathy: Secondary | ICD-10-CM | POA: Diagnosis not present

## 2020-03-10 DIAGNOSIS — Z992 Dependence on renal dialysis: Secondary | ICD-10-CM | POA: Diagnosis not present

## 2020-03-10 DIAGNOSIS — N186 End stage renal disease: Secondary | ICD-10-CM | POA: Diagnosis not present

## 2020-03-10 DIAGNOSIS — D631 Anemia in chronic kidney disease: Secondary | ICD-10-CM | POA: Diagnosis not present

## 2020-03-10 DIAGNOSIS — N2581 Secondary hyperparathyroidism of renal origin: Secondary | ICD-10-CM | POA: Diagnosis not present

## 2020-03-13 ENCOUNTER — Encounter: Payer: Self-pay | Admitting: Family Medicine

## 2020-03-13 DIAGNOSIS — E1121 Type 2 diabetes mellitus with diabetic nephropathy: Secondary | ICD-10-CM | POA: Diagnosis not present

## 2020-03-13 DIAGNOSIS — Z992 Dependence on renal dialysis: Secondary | ICD-10-CM | POA: Diagnosis not present

## 2020-03-13 DIAGNOSIS — D631 Anemia in chronic kidney disease: Secondary | ICD-10-CM | POA: Diagnosis not present

## 2020-03-13 DIAGNOSIS — N186 End stage renal disease: Secondary | ICD-10-CM | POA: Diagnosis not present

## 2020-03-13 DIAGNOSIS — N2581 Secondary hyperparathyroidism of renal origin: Secondary | ICD-10-CM | POA: Diagnosis not present

## 2020-03-14 ENCOUNTER — Ambulatory Visit: Payer: Medicare Other | Admitting: Physical Therapy

## 2020-03-14 ENCOUNTER — Encounter: Payer: Self-pay | Admitting: Physical Therapy

## 2020-03-14 ENCOUNTER — Encounter: Payer: Self-pay | Admitting: Family Medicine

## 2020-03-14 ENCOUNTER — Ambulatory Visit (INDEPENDENT_AMBULATORY_CARE_PROVIDER_SITE_OTHER): Payer: Medicare Other | Admitting: Family Medicine

## 2020-03-14 ENCOUNTER — Other Ambulatory Visit: Payer: Self-pay

## 2020-03-14 VITALS — BP 122/64 | HR 83 | Ht 70.0 in | Wt 244.2 lb

## 2020-03-14 DIAGNOSIS — N186 End stage renal disease: Secondary | ICD-10-CM

## 2020-03-14 DIAGNOSIS — M6281 Muscle weakness (generalized): Secondary | ICD-10-CM | POA: Diagnosis not present

## 2020-03-14 DIAGNOSIS — E1169 Type 2 diabetes mellitus with other specified complication: Secondary | ICD-10-CM | POA: Diagnosis not present

## 2020-03-14 DIAGNOSIS — E1122 Type 2 diabetes mellitus with diabetic chronic kidney disease: Secondary | ICD-10-CM | POA: Diagnosis not present

## 2020-03-14 DIAGNOSIS — E782 Mixed hyperlipidemia: Secondary | ICD-10-CM

## 2020-03-14 DIAGNOSIS — Z23 Encounter for immunization: Secondary | ICD-10-CM

## 2020-03-14 DIAGNOSIS — R2681 Unsteadiness on feet: Secondary | ICD-10-CM

## 2020-03-14 DIAGNOSIS — Z992 Dependence on renal dialysis: Secondary | ICD-10-CM | POA: Diagnosis not present

## 2020-03-14 DIAGNOSIS — I2510 Atherosclerotic heart disease of native coronary artery without angina pectoris: Secondary | ICD-10-CM | POA: Diagnosis not present

## 2020-03-14 DIAGNOSIS — G931 Anoxic brain damage, not elsewhere classified: Secondary | ICD-10-CM

## 2020-03-14 DIAGNOSIS — E785 Hyperlipidemia, unspecified: Secondary | ICD-10-CM | POA: Diagnosis not present

## 2020-03-14 DIAGNOSIS — E1349 Other specified diabetes mellitus with other diabetic neurological complication: Secondary | ICD-10-CM

## 2020-03-14 LAB — POCT GLYCOSYLATED HEMOGLOBIN (HGB A1C): Hemoglobin A1C: 5.7 % — AB (ref 4.0–5.6)

## 2020-03-14 MED ORDER — KETOCONAZOLE 2 % EX CREA: 1.0000 "application " | TOPICAL_CREAM | Freq: Every day | CUTANEOUS | 1 refills | Status: AC

## 2020-03-14 MED ORDER — ASPIRIN EC 81 MG PO TBEC: 81.0000 mg | DELAYED_RELEASE_TABLET | Freq: Every day | ORAL | 11 refills | Status: AC

## 2020-03-14 NOTE — Assessment & Plan Note (Signed)
Restart baby ASA. I refilled this meds. Continue Statins.

## 2020-03-14 NOTE — Assessment & Plan Note (Signed)
Discussed referral to podiatrist. Note that he also has Athlete's foot on exam. Ketoconazole cream prescribed.

## 2020-03-14 NOTE — Assessment & Plan Note (Signed)
A1C looks good. He is actually in the preDM range. Obtain A1C in about 4-6 months. Continue CBG check and Bmet at Dialysis.

## 2020-03-14 NOTE — Progress Notes (Signed)
    SUBJECTIVE:   CHIEF COMPLAINT / HPI:   DM2:Off meds. Does not check his CBGs at home. However, he checks at dialysis and it has been good.  ESRD on HD: M-W-F  CAD: self d/ced his ASA. No cardiovascular concerns.  HLD: He is compliant with his lipitor 80 mg qd. No concerns.  Weight: Gain few lbs since the last few months. His appetite is back to his baseline.  Anoxic brain injury: Denies memory issues. Still has jerking movement. Currently undergoing PT. Was evaluated by neurologist in the past 6 months, he said. He is uncertain if he takes his Keppra or not.   PERTINENT  PMH / PSH: PMX reviewed  OBJECTIVE:   BP 122/64   Pulse 83   Ht 5\' 10"  (1.778 m)   Wt 244 lb 3.2 oz (110.8 kg)   SpO2 97%   BMI 35.04 kg/m   Physical Exam Vitals and nursing note reviewed.  Cardiovascular:     Rate and Rhythm: Normal rate and regular rhythm.     Pulses: Normal pulses.     Heart sounds: Normal heart sounds. No murmur heard.   Pulmonary:     Effort: Pulmonary effort is normal. No respiratory distress.     Breath sounds: Normal breath sounds. No wheezing.  Abdominal:     General: Bowel sounds are normal. There is no distension.  Musculoskeletal:     Right lower leg: No edema.     Left lower leg: No edema.     Comments: Sensory exam of the foot is reduced sensation mostly of his left toes and a few area on his right foot, tested with the monofilament. Good pulses, no lesions or ulcers, good peripheral pulses. + Athlete foot      ASSESSMENT/PLAN:   DM (diabetes mellitus), type 2 with renal complications ([facility]) U7O looks good. He is actually in the preDM range. Obtain A1C in about 4-6 months. Continue CBG check and Bmet at Dialysis.  End stage renal disease on dialysis [facility]) Compliant with dialysis M-W-F  Diabetic neuropathy [facility]) Discussed referral to podiatrist. Note that he also has Athlete's foot on exam. Ketoconazole cream prescribed.  Coronary  atherosclerosis Restart baby ASA. I refilled this meds. Continue Statins.  HLD (hyperlipidemia) Compliant with Lipitor 80 mg qd. FLP checked today.  Morbid obesity ([place]) Limited exercise. Improve on diet. Monitor for improvement.  Anoxic brain injury [facility]) I called his daughter at his request. She confirmed that he takes his Keppra per neurologist' recommendation. F/U neuro for medication management. Continue PT as scheduled, seems to be helping.     Andrena Mews, MD [facility] 09/02/19

## 2020-03-14 NOTE — Assessment & Plan Note (Signed)
Compliant with Lipitor 80 mg qd. FLP checked today.

## 2020-03-14 NOTE — Patient Instructions (Signed)
Athlete's Foot  Athlete's foot (tinea pedis) is a fungal infection of the skin on your feet. It often occurs on the skin that is between or underneath your toes. It can also occur on the soles of your feet. Symptoms include itchy or white and flaky areas on the skin. The infection can spread from person to person (is contagious). It can also spread when a person's bare feet come in contact with the fungus on shower floors or on items such as shoes. Follow these instructions at home: Medicines  Apply or take over-the-counter and prescription medicines only as told by your doctor.  Apply your antifungal medicine as told by your doctor. Do not stop using the medicine even if your feet start to get better. Foot care  Do not scratch your feet.  Keep your feet dry: ? Wear cotton or wool socks. Change your socks every day or if they become wet. ? Wear shoes that allow air to move around, such as sandals or canvas tennis shoes.  Wash and dry your feet: ? Every day or as told by your doctor. ? After exercising. ? Including the area between your toes. General instructions  Do not share any of these items that touch your feet: ? Towels. ? Shoes. ? Nail clippers. ? Other personal items.  Protect your feet by wearing sandals in wet areas, such as locker rooms and shared showers.  Keep all follow-up visits as told by your doctor. This is important.  If you have diabetes, keep your blood sugar under control. Contact a doctor if:  You have a fever.  You have swelling, pain, warmth, or redness in your foot.  Your feet are not getting better with treatment.  Your symptoms get worse.  You have new symptoms. Summary  Athlete's foot is a fungal infection of the skin on your feet.  Symptoms include itchy or white and flaky areas on the skin.  Apply your antifungal medicine as told by your doctor.  Keep your feet clean and dry. This information is not intended to replace advice given  to you by your health care provider. Make sure you discuss any questions you have with your health care provider. Document Revised: 05/29/2017 Document Reviewed: 03/24/2017 Elsevier Patient Education  2020 Elsevier Inc.  

## 2020-03-14 NOTE — Therapy (Signed)
[facility] [address] [place], [place], [address] Phone: [phone]   Fax:  [phone]  Physical Therapy Treatment  Patient Details  Name: Troy Wells MRN: [id number] Date of Birth: Jul 01, 1965 Referring Provider (PT): Dr. Andrena Mews   Encounter Date: 03/14/2020   PT End of Session - 03/14/20 0854    Visit Number 20    Number of Visits 32    Date for PT Re-Evaluation 04/25/20    Authorization Type Medicare, PN 11/30/05, Recert 3/71/06    Progress Note Due on Visit 30    PT Start Time 0849    PT Stop Time 0927    PT Time Calculation (min) 38 min    Equipment Utilized During Treatment Gait belt    Activity Tolerance Patient tolerated treatment well;No increased pain    Behavior During Therapy [facility] for tasks assessed/performed           Past Medical History:  Diagnosis Date   CARDIAC ARREST 11/01/2009   Qualifier: Diagnosis of  By: Selena Batten CMA, [place]     Coagulase negative Staphylococcus bacteremia 06/30/2019   Colon polyps 01/02/2016   Colonoscopy July 2017 - One 3 mm polyp in the transverse colon, removed with a cold biopsy forceps. Resected and retrieved. - One 3 mm polyp in the rectum, removed with a cold biopsy forceps. Resected and retrieved. - Diverticulosis in the entire examined colon. - Non-bleeding internal hemorrhoids. - The examination was otherwise normal. - Significant looping which prolonged cecal     Coronary artery disease 08/15/2009   with AMI   Coronary atherosclerosis 11/01/2009   Qualifier: Diagnosis of  By: Selena Batten CMA, Jewel     [facility]) 09/27/2014   mon, wed, and fri   Diverticulosis of colon without hemorrhage 01/02/2016   Colonoscopy July 2017 - One 3 mm polyp in the transverse colon, removed with a cold biopsy forceps. Resected and retrieved. - One 3 mm polyp in the rectum, removed with a cold biopsy forceps. Resected and retrieved. -  Diverticulosis in the entire examined colon. - Non-bleeding internal hemorrhoids. - The examination was otherwise normal. 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   Pulmonary hypertension ([place]) 04/14/2019   RVSP 38 (TTE 2017) but repeat TTE March 2019 with RVSP 25 RAP in 2020: 73mhg   Sleep apnea    cpap nightly    Past Surgical History:  Procedure Laterality Date   AV FISTULA PLACEMENT Left 09/27/2014   INSERTION OF ARTERIOVENOUS (AV) ARTEGRAFT ARM Left 04/02/2018   Procedure: INSERTION OF ARTERIOVENOUS (AV) ARTEGRAFT ARM LEFT UPPER ARM;  Surgeon: CWaynetta Sandy MD;  [facility]  Service: Vascular;  Laterality: Left;   INSERTION OF DIALYSIS CATHETER N/A 04/02/2018  Procedure: INSERTION OF DIALYSIS CATHETER, right internal jugular;  Surgeon: Waynetta Sandy, MD;  Location: [facility];  Service: Vascular;  Laterality: N/A;   NECK SURGERY     C6 & C7 replaced 30 yrs ago per pt   REVISON OF ARTERIOVENOUS FISTULA Left 04/02/2018   Procedure: REVISION PLICATION OF ARTERIOVENOUS FISTULA ARM;  Surgeon: Waynetta Sandy, MD;  Location: [facility];  Service: Vascular;  Laterality: Left;   WISDOM TOOTH EXTRACTION      There were no vitals  filed for this visit.   Subjective Assessment - 03/14/20 0852    Subjective No new complaints. No falls. Has been going up/down stairs with single rail/cane with no issues.    Pertinent History CHF, HTN, ERSD    Limitations Standing;Walking;Writing;House hold activities;Lifting    How long can you sit comfortably? supported sitting no problem    How long can you stand comfortably? <5 min    How long can you walk comfortably? 20 feet with walker    Patient Stated Goals improve walking get stronger    Currently in Pain? No/denies                 [facility] Adult PT Treatment/Exercise - 03/14/20 0856      Transfers   Transfers Sit to Stand;Stand to Sit    Sit to Stand 5: Supervision;With upper extremity assist;From chair/3-in-1;From bed    Stand to Sit 5: Supervision;With upper extremity assist;To chair/3-in-1;To bed      Ambulation/Gait   Ambulation/Gait Yes    Ambulation/Gait Assistance 5: Supervision;4: Min guard    Ambulation/Gait Assistance Details cues on posture, step length and cane placement so not to step on it    Ambulation Distance (Feet) 230 Feet   , plus around gym   Assistive device Straight cane   with rubber quad tip   Gait Pattern Decreased stride length;Decreased trunk rotation;Trunk flexed;Wide base of support;Poor foot clearance - left;Poor foot clearance - right    Ambulation Surface Level;Indoor    Stairs Yes    Stairs Assistance 4: Min guard    Stairs Assistance Details (indicate cue type and reason) forward to ascend, partial sideways to descend with right rail/cane in left UE. increased time needed with descent to advance left LE. no physical assistance needed.    Stair Management Technique One rail Right;Step to pattern;Forwards;Sideways;With cane    Number of Stairs 4   x2 reps              Balance Exercises - 03/14/20 0912      Balance Exercises: Standing   Rockerboard Anterior/posterior;Head turns;EO;EC;30 seconds;10 reps;Intermittent UE  support;Limitations    Rockerboard Limitations on balance board in ant/post direction: rocking the board with EO, progressing to EC with light support progressing to no support with each, min guard to min assist for balance; holding the board steady with no UE support for EC 30 sec's x 3 reps, then with light fingertip support on bars for EC head movements left<>right, up<>down for ~10 reps each with min guard to min assist for balance. cues needed on posture and weight shifting for balance.                PT Short Term Goals - 03/01/20 1027      PT SHORT TERM GOAL #1   Title Pt will be able to ambulate 500 feet with quad cane with SBA to improve short term ambulation (all STG to be met by 03/29/20)  Baseline 115' with quad cane (02/29/20)    Time 4    Period Weeks    Status Achieved    Target Date 03/29/20      PT SHORT TERM GOAL #2   Title Patient will be able to come down steps facing forward to improve ability to negotiate 12 steps with bil rails    Baseline comes down backwards or scoots on buttocks (02/29/20)    Time 4    Period Weeks    Status Revised    Target Date 03/29/20      PT SHORT TERM GOAL #3   Title --    Baseline --    Time --    Period --    Status --    Target Date --      PT SHORT TERM GOAL #4   Title --    Baseline --    Time --    Period --    Status --    Target Date --      PT SHORT TERM GOAL #5   Title --             PT Long Term Goals - 02/29/20 1311      PT LONG TERM GOAL #1   Title Patient will be able to ambulate >1000 feet with quad cane to improve community ambulation    Baseline 20 feet with RW, 115' with quad cane (02/29/20)    Time 8    Period Weeks    Status Revised    Target Date 04/25/20      PT LONG TERM GOAL #2   Title Patient will be able to perform TUG <25 seconds with RW to improve overall functional mobility    Baseline 36 seconds with RW    Time 8    Period Weeks    Status Revised    Target Date 04/25/20        PT LONG TERM GOAL #3   Title Patient will be able to go up and down 17 steps with use of one rail with sBA to improve ability to go up and down steps    Baseline min A going up and scoots down on his butt; 12 steps with bil hand rail (coming down backward)    Time 8    Period Weeks    Status On-going    Target Date 04/25/20      PT LONG TERM GOAL #4   Title Patient will demo improvement on Berg balance scale to at least 43/56 to improve functional balance and reduce fall risk    Baseline 30/56; 37/56 02/10/2020; 42/56 (02/29/20)    Time 8    Period Weeks    Status Revised    Target Date 04/25/20                 Plan - 03/14/20 0854    Clinical Impression Statement Today's skilled session continued to focus on gait with cane, stair negotiation training with rail/cane and balance reactions. Pt with a couple of episodes of LE tremors, however was able to push through and continued working. Rest breaks needed throughout session due to fatigue. The pt is progressing toward goals and should benefit from continued PT to progress toward unmet goals.    Personal Factors and Comorbidities Comorbidity 3+;Past/Current Experience;Time since onset of injury/illness/exacerbation;Transportation;Fitness;Age    Comorbidities CHF, ESRD, HTN    Examination-Activity Limitations Bathing;Bed Mobility;Caring for Others;Carry;Dressing;Hygiene/Grooming;Lift;Locomotion Level;Self Feeding;Sit;Squat;Stairs;Stand;Toileting;Transfers    Examination-Participation Restrictions Cleaning;Community  Activity;Driving;Interpersonal Relationship;Laundry;Medication Management;Meal Prep;Shop;Yard Work    Stability/Clinical Decision Making Evolving/Moderate complexity    Rehab Potential Good    PT Frequency 2x / week    PT Duration 8 weeks    PT Treatment/Interventions ADLs/Self Care Home Management;Gait training;Stair training;Functional mobility training;Therapeutic activities;Therapeutic exercise;Balance  training;Neuromuscular re-education;Patient/family education;Manual techniques;Passive range of motion;Joint Manipulations    PT Next Visit Plan Continue with PWR moves, progress gait with straight cane with rubber quad tip, continue to  work on stairs, balance reaction training    PT [place] and Agree with Plan of Care Patient           Patient will benefit from skilled therapeutic intervention in order to improve the following deficits and impairments:  Abnormal gait, Decreased activity tolerance, Decreased balance, Decreased mobility, Decreased endurance, Decreased coordination, Decreased range of motion, Decreased safety awareness, Decreased strength, Difficulty walking, Impaired flexibility, Postural dysfunction, Impaired UE functional use, Impaired tone  Visit Diagnosis: Muscle weakness (generalized)  Unsteadiness on feet     Problem List Patient Active Problem List   Diagnosis Date Noted   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)    History of cardiac arrest 04/27/2019   History of non-ST elevation myocardial infarction (NSTEMI) 04/18/2019   Morbid obesity ([place]) 04/14/2019   Diabetic neuropathy ([place]) 04/02/2016   Hypertension associated with diabetes ([place]) 10/03/2015   HLD (hyperlipidemia) 09/06/2015   End stage renal disease on dialysis ([place]) 08/31/2015   DM (diabetes mellitus), type 2 with renal complications ([place]) 32/20/2542   OSA treated with BiPAP 12/12/2013   Coronary atherosclerosis 11/01/2009    Willow Ora, PTA, [facility] [address] [place], [place] [address] [phone] 03/14/20, 11:39 AM   Name: Troy Wells MRN: [id number] Date of Birth: 07/20/1965

## 2020-03-14 NOTE — Assessment & Plan Note (Signed)
I called his daughter at his request. She confirmed that he takes his Keppra per neurologist' recommendation. F/U neuro for medication management. Continue PT as scheduled, seems to be helping.

## 2020-03-14 NOTE — Assessment & Plan Note (Signed)
Compliant with dialysis M-W-F

## 2020-03-14 NOTE — Assessment & Plan Note (Signed)
Limited exercise. Improve on diet. Monitor for improvement.

## 2020-03-15 ENCOUNTER — Telehealth: Payer: Self-pay | Admitting: Family Medicine

## 2020-03-15 DIAGNOSIS — Z992 Dependence on renal dialysis: Secondary | ICD-10-CM | POA: Diagnosis not present

## 2020-03-15 DIAGNOSIS — N2581 Secondary hyperparathyroidism of renal origin: Secondary | ICD-10-CM | POA: Diagnosis not present

## 2020-03-15 DIAGNOSIS — N186 End stage renal disease: Secondary | ICD-10-CM | POA: Diagnosis not present

## 2020-03-15 DIAGNOSIS — E1121 Type 2 diabetes mellitus with diabetic nephropathy: Secondary | ICD-10-CM | POA: Diagnosis not present

## 2020-03-15 DIAGNOSIS — D631 Anemia in chronic kidney disease: Secondary | ICD-10-CM | POA: Diagnosis not present

## 2020-03-15 LAB — LIPID PANEL
Chol/HDL Ratio: 3.8 ratio (ref 0.0–5.0)
Cholesterol, Total: 138 mg/dL (ref 100–199)
HDL: 36 mg/dL — ABNORMAL LOW (ref >39)
LDL Chol Calc (NIH): 74 mg/dL (ref 0–99)
Triglycerides: 164 mg/dL — ABNORMAL HIGH (ref 0–149)
VLDL Cholesterol Cal: 28 mg/dL (ref 5–40)

## 2020-03-15 NOTE — Telephone Encounter (Signed)
I was unable to reach the patient for his lab result. He requested that I always call his daughter for any update.  I called and spoke with [place] about his FLP. His LD is close to goal. No medication adjustment recommended at this point. He will continue to work on diet for weight loss.  Triglyceride is still elevated but improved from the last. Plan is to start Fish oil 2 tablets BID. Repeat FLP in 12 months.

## 2020-03-16 ENCOUNTER — Other Ambulatory Visit: Payer: Self-pay

## 2020-03-16 ENCOUNTER — Ambulatory Visit: Payer: Medicare Other | Admitting: Physical Therapy

## 2020-03-16 ENCOUNTER — Encounter: Payer: Self-pay | Admitting: Physical Therapy

## 2020-03-16 DIAGNOSIS — R2681 Unsteadiness on feet: Secondary | ICD-10-CM | POA: Diagnosis not present

## 2020-03-16 DIAGNOSIS — M6281 Muscle weakness (generalized): Secondary | ICD-10-CM

## 2020-03-16 NOTE — Therapy (Signed)
[facility] [address] [place], [place], [address] Phone: [phone]   Fax:  [phone]  Physical Therapy Treatment  Patient Details  Name: Troy Wells MRN: [id number] Date of Birth: 1965/08/15 Referring Provider (PT): Dr. Andrena Mews   Encounter Date: 03/16/2020   PT End of Session - 03/16/20 0851    Visit Number 21    Number of Visits 32    Date for PT Re-Evaluation 04/25/20    Authorization Type Medicare, PN 08/26/19, Recert 12/08/44    Progress Note Due on Visit 30    PT Start Time 0849    PT Stop Time 0929    PT Time Calculation (min) 40 min    Equipment Utilized During Treatment Gait belt    Activity Tolerance Patient tolerated treatment well;No increased pain    Behavior During Therapy WFL for tasks assessed/performed           Past Medical History:  Diagnosis Date  . CARDIAC ARREST 11/01/2009   Qualifier: Diagnosis of  By: Selena Batten CMA, Jewel    . Coagulase negative Staphylococcus bacteremia 06/30/2019  . Colon polyps 01/02/2016   Colonoscopy July 2017 - One 3 mm polyp in the transverse colon, removed with a cold biopsy forceps. Resected and retrieved. - One 3 mm polyp in the rectum, removed with a cold biopsy forceps. Resected and retrieved. - Diverticulosis in the entire examined colon. - Non-bleeding internal hemorrhoids. - The examination was otherwise normal. - Significant looping which prolonged cecal    . Coronary artery disease 08/15/2009   with AMI  . Coronary atherosclerosis 11/01/2009   Qualifier: Diagnosis of  By: Selena Batten CMA, Jewel    . Dialysis patient ([facility]) 09/27/2014   mon, wed, and fri  . Diverticulosis of colon without hemorrhage 01/02/2016   Colonoscopy July 2017 - One 3 mm polyp in the transverse colon, removed with a cold biopsy forceps. Resected and retrieved. - One 3 mm polyp in the rectum, removed with a cold biopsy forceps. Resected and retrieved. -  Diverticulosis in the entire examined colon. - Non-bleeding internal hemorrhoids. - The examination was otherwise normal. - Significant looping which prolonged cecal    . DM (diabetes mellitus), type 2 with renal complications ([facility]) 95/12/2255  . Essential hypertension 11/01/2009   Qualifier: Diagnosis of  By: Selena Batten CMA, Jewel    . Gout 10/21/2018  . History of acute respiratory distress syndrome (ARDS) due to COVID-19 virus   . History of cardiac arrest 04/27/2019  . History of non-ST elevation myocardial infarction (NSTEMI) 04/18/2019  . history of respiratory arrest   . Hyperlipidemia   . Hypertension   . Mitral valve regurgitation 09/19/2015   Echo 09/2015   . 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 OSA treated with BiPAP 06/17/2010  . Protein-calorie malnutrition ([facility]) 06/01/2019   Poor PO intake, only eating 50% of meals, could benefit from feeding tube if persistently poor PO intake.  . Pulmonary hypertension ([facility]) 04/14/2019   RVSP 38 (TTE 2017) but repeat TTE March 2019 with RVSP 25 RAP in 2020: 34mhg  . Sleep apnea    cpap nightly    Past Surgical History:  Procedure Laterality Date  . AV FISTULA PLACEMENT Left 09/27/2014  . INSERTION OF ARTERIOVENOUS (AV) ARTEGRAFT ARM Left 04/02/2018   Procedure: INSERTION OF ARTERIOVENOUS (AV) ARTEGRAFT ARM LEFT UPPER ARM;  Surgeon: CWaynetta Sandy MD;  Location: [facility]  Service: Vascular;  Laterality: Left;  . INSERTION OF DIALYSIS CATHETER N/A 04/02/2018  Procedure: INSERTION OF DIALYSIS CATHETER, right internal jugular;  Surgeon: Waynetta Sandy, MD;  Location: [facility];  Service: Vascular;  Laterality: N/A;  . NECK SURGERY     C6 & C7 replaced 30 yrs ago per pt  . REVISON OF ARTERIOVENOUS FISTULA Left 04/02/2018   Procedure: REVISION PLICATION OF ARTERIOVENOUS FISTULA ARM;  Surgeon: Waynetta Sandy, MD;  Location: [facility];  Service: Vascular;  Laterality: Left;  . WISDOM TOOTH EXTRACTION      There were no vitals  filed for this visit.   Subjective Assessment - 03/16/20 0851    Subjective No new complaints. No falls or pain to report. Has been using the cane more at home. HEP is still going well.    Pertinent History CHF, HTN, ERSD    Limitations Standing;Walking;Writing;House hold activities;Lifting    How long can you sit comfortably? supported sitting no problem    How long can you stand comfortably? <5 min    How long can you walk comfortably? 20 feet with walker    Patient Stated Goals improve walking get stronger    Currently in Pain? No/denies    Pain Score 0-No pain                  [facility] Adult PT Treatment/Exercise - 03/16/20 0852      Transfers   Transfers Sit to Stand;Stand to Sit    Sit to Stand 5: Supervision;With upper extremity assist;From chair/3-in-1;From bed    Stand to Sit 5: Supervision;With upper extremity assist;To chair/3-in-1;To bed      Ambulation/Gait   Ambulation/Gait Yes    Ambulation/Gait Assistance 5: Supervision;4: Min guard    Ambulation/Gait Assistance Details cues on posture, step length and cane placement so not to step on it.     Ambulation Distance (Feet) 230 Feet   x1, plus around gym    Assistive device Straight cane   with rubber quad tip   Ambulation Surface Level;Indoor      High Level Balance   High Level Balance Activities Negotiating over obstacles    High Level Balance Comments forward stepping over 3 bolsters of varied heights with support on counter/HHA opposite side for 4 laps, progressing to with cane/HHA opposite side for 4 laps. cues needed on sequencing, posture and step length to clear bolster. Increased time needed initially to initiate movements due to tremors, this improved as laps progressed.              [facility]) - 03/16/20 5916    PWR! Up 10    PWR! Rock 10    PWR! Twist 10    PWR! Step 10    Comments cues needed on correct form/technique. Pt was able to move both UE/LE's together this session, has not been able to  do so with previous sessions.                  PT Short Term Goals - 03/01/20 1027      PT SHORT TERM GOAL #1   Title Pt will be able to ambulate 500 feet with quad cane with SBA to improve short term ambulation (all STG to be met by 03/29/20)    Baseline 115' with quad cane (02/29/20)    Time 4    Period Weeks    Status Achieved    Target Date 03/29/20      PT SHORT TERM GOAL #2   Title Patient will be able to come down steps facing  forward to improve ability to negotiate 12 steps with bil rails    Baseline comes down backwards or scoots on buttocks (02/29/20)    Time 4    Period Weeks    Status Revised    Target Date 03/29/20      PT SHORT TERM GOAL #3   Title --    Baseline --    Time --    Period --    Status --    Target Date --      PT SHORT TERM GOAL #4   Title --    Baseline --    Time --    Period --    Status --    Target Date --      PT SHORT TERM GOAL #5   Title --             PT Long Term Goals - 02/29/20 1311      PT LONG TERM GOAL #1   Title Patient will be able to ambulate >1000 feet with quad cane to improve community ambulation    Baseline 20 feet with RW, 115' with quad cane (02/29/20)    Time 8    Period Weeks    Status Revised    Target Date 04/25/20      PT LONG TERM GOAL #2   Title Patient will be able to perform TUG <25 seconds with RW to improve overall functional mobility    Baseline 36 seconds with RW    Time 8    Period Weeks    Status Revised    Target Date 04/25/20      PT LONG TERM GOAL #3   Title Patient will be able to go up and down 17 steps with use of one rail with sBA to improve ability to go up and down steps    Baseline min A going up and scoots down on his butt; 12 steps with bil hand rail (coming down backward)    Time 8    Period Weeks    Status On-going    Target Date 04/25/20      PT LONG TERM GOAL #4   Title Patient will demo improvement on Berg balance scale to at least 43/56 to improve  functional balance and reduce fall risk    Baseline 30/56; 37/56 02/10/2020; 42/56 (02/29/20)    Time 8    Period Weeks    Status Revised    Target Date 04/25/20                 Plan - 03/16/20 0851    Clinical Impression Statement Today's skilled session continued to focus on gait/balance with straight cane and use of PWR! ex's to work on large movements with decreased tremors and improved initiation. Rest breaks needed due to fatigue. No other issues noted or reported in session. The pt is progressing toward goals and should benefit from continued PT to progress toward unmet goals.    Personal Factors and Comorbidities Comorbidity 3+;Past/Current Experience;Time since onset of injury/illness/exacerbation;Transportation;Fitness;Age    Comorbidities CHF, ESRD, HTN    Examination-Activity Limitations Bathing;Bed Mobility;Caring for Others;Carry;Dressing;Hygiene/Grooming;Lift;Locomotion Level;Self Feeding;Sit;Squat;Stairs;Stand;Toileting;Transfers    Examination-Participation Restrictions Cleaning;Community Activity;Driving;Interpersonal Relationship;Laundry;Medication Management;Meal Prep;Shop;Yard Work    Stability/Clinical Decision Making Evolving/Moderate complexity    Rehab Potential Good    PT Frequency 2x / week    PT Duration 8 weeks    PT Treatment/Interventions ADLs/Self Care Home Management;Gait training;Stair training;Functional mobility training;Therapeutic activities;Therapeutic exercise;Balance training;Neuromuscular re-education;Patient/family education;Manual techniques;Passive  range of motion;Joint Manipulations    PT Next Visit Plan Continue with PWR moves, progress gait with straight cane with rubber quad tip, continue to  work on stairs, balance reaction training    PT [place] and Agree with Plan of Care Patient           Patient will benefit from skilled therapeutic intervention in order to improve the following deficits  and impairments:  Abnormal gait, Decreased activity tolerance, Decreased balance, Decreased mobility, Decreased endurance, Decreased coordination, Decreased range of motion, Decreased safety awareness, Decreased strength, Difficulty walking, Impaired flexibility, Postural dysfunction, Impaired UE functional use, Impaired tone  Visit Diagnosis: Muscle weakness (generalized)  Unsteadiness on feet     Problem List Patient Active Problem List   Diagnosis Date Noted  . 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 Gait abnormality 08/12/2019  . History of gout 06/17/2019  . Bowel incontinence 06/09/2019  . Anoxic brain injury ([facility])   . History of cardiac arrest 04/27/2019  . History of non-ST elevation myocardial infarction (NSTEMI) 04/18/2019  . Morbid obesity ([facility]) 04/14/2019  . Diabetic neuropathy ([facility]) 04/02/2016  . Hypertension associated with diabetes ([facility]) 10/03/2015  . HLD (hyperlipidemia) 09/06/2015  . End stage renal disease on dialysis ([facility]) 08/31/2015  . DM (diabetes mellitus), type 2 with renal complications ([facility]) 01/59/9689  . OSA treated with BiPAP 12/12/2013  . Coronary atherosclerosis 11/01/2009    Willow Ora, PTA, [facility] [address], [place], [place] [address] [phone] 03/16/20, 9:30 AM   Name: Kali Ambler MRN: [id number] Date of Birth: Dec 07, 1965

## 2020-03-17 DIAGNOSIS — E1121 Type 2 diabetes mellitus with diabetic nephropathy: Secondary | ICD-10-CM | POA: Diagnosis not present

## 2020-03-17 DIAGNOSIS — D631 Anemia in chronic kidney disease: Secondary | ICD-10-CM | POA: Diagnosis not present

## 2020-03-17 DIAGNOSIS — N186 End stage renal disease: Secondary | ICD-10-CM | POA: Diagnosis not present

## 2020-03-17 DIAGNOSIS — N2581 Secondary hyperparathyroidism of renal origin: Secondary | ICD-10-CM | POA: Diagnosis not present

## 2020-03-17 DIAGNOSIS — Z992 Dependence on renal dialysis: Secondary | ICD-10-CM | POA: Diagnosis not present

## 2020-03-17 DIAGNOSIS — E1122 Type 2 diabetes mellitus with diabetic chronic kidney disease: Secondary | ICD-10-CM | POA: Diagnosis not present

## 2020-03-20 DIAGNOSIS — N2581 Secondary hyperparathyroidism of renal origin: Secondary | ICD-10-CM | POA: Diagnosis not present

## 2020-03-20 DIAGNOSIS — Z992 Dependence on renal dialysis: Secondary | ICD-10-CM | POA: Diagnosis not present

## 2020-03-20 DIAGNOSIS — N186 End stage renal disease: Secondary | ICD-10-CM | POA: Diagnosis not present

## 2020-03-20 DIAGNOSIS — D631 Anemia in chronic kidney disease: Secondary | ICD-10-CM | POA: Diagnosis not present

## 2020-03-20 DIAGNOSIS — E1121 Type 2 diabetes mellitus with diabetic nephropathy: Secondary | ICD-10-CM | POA: Diagnosis not present

## 2020-03-21 ENCOUNTER — Ambulatory Visit: Payer: Medicare Other | Attending: Family Medicine | Admitting: Physical Therapy

## 2020-03-21 ENCOUNTER — Encounter: Payer: Self-pay | Admitting: Physical Therapy

## 2020-03-21 ENCOUNTER — Other Ambulatory Visit: Payer: Self-pay

## 2020-03-21 DIAGNOSIS — R2681 Unsteadiness on feet: Secondary | ICD-10-CM | POA: Diagnosis not present

## 2020-03-21 DIAGNOSIS — M6281 Muscle weakness (generalized): Secondary | ICD-10-CM | POA: Diagnosis not present

## 2020-03-22 DIAGNOSIS — N2581 Secondary hyperparathyroidism of renal origin: Secondary | ICD-10-CM | POA: Diagnosis not present

## 2020-03-22 DIAGNOSIS — E1121 Type 2 diabetes mellitus with diabetic nephropathy: Secondary | ICD-10-CM | POA: Diagnosis not present

## 2020-03-22 DIAGNOSIS — D631 Anemia in chronic kidney disease: Secondary | ICD-10-CM | POA: Diagnosis not present

## 2020-03-22 DIAGNOSIS — N186 End stage renal disease: Secondary | ICD-10-CM | POA: Diagnosis not present

## 2020-03-22 DIAGNOSIS — Z992 Dependence on renal dialysis: Secondary | ICD-10-CM | POA: Diagnosis not present

## 2020-03-22 NOTE — Therapy (Signed)
[facility] [address] [place], [place], [address] Phone: [phone]   Fax:  [phone]  Physical Therapy Treatment  Patient Details  Name: Troy Wells MRN: [id number] Date of Birth: 12/21/1965 Referring Provider (PT): Dr. Andrena Mews   Encounter Date: 03/21/2020     03/21/20 0809  PT Visits / Re-Eval  Visit Number 22  Number of Visits 32  Date for PT Re-Evaluation 04/25/20  Authorization  Authorization Type Medicare, PN 08/24/38, Recert 7/68/08  Progress Note Due on Visit 30  PT Time Calculation  PT Start Time 0805  PT Stop Time 0845  PT Time Calculation (min) 40 min  PT - End of Session  Equipment Utilized During Treatment Gait belt  Activity Tolerance Patient tolerated treatment well;No increased pain  Behavior During Therapy WFL for tasks assessed/performed    Past Medical History:  Diagnosis Date  . CARDIAC ARREST 11/01/2009   Qualifier: Diagnosis of  By: Selena Batten CMA, Jewel    . Coagulase negative Staphylococcus bacteremia 06/30/2019  . Colon polyps 01/02/2016   Colonoscopy July 2017 - One 3 mm polyp in the transverse colon, removed with a cold biopsy forceps. Resected and retrieved. - One 3 mm polyp in the rectum, removed with a cold biopsy forceps. Resected and retrieved. - Diverticulosis in the entire examined colon. - Non-bleeding internal hemorrhoids. - The examination was otherwise normal. - Significant looping which prolonged cecal    . Coronary artery disease 08/15/2009   with AMI  . Coronary atherosclerosis 11/01/2009   Qualifier: Diagnosis of  By: Selena Batten CMA, Jewel    . Dialysis patient ([facility]) 09/27/2014   mon, wed, and fri  . Diverticulosis of colon without hemorrhage 01/02/2016   Colonoscopy July 2017 - One 3 mm polyp in the transverse colon, removed with a cold biopsy forceps. Resected and retrieved. - One 3 mm polyp in the rectum, removed with a cold biopsy forceps.  Resected and retrieved. - Diverticulosis in the entire examined colon. - Non-bleeding internal hemorrhoids. - The examination was otherwise normal. - Significant looping which prolonged cecal    . DM (diabetes mellitus), type 2 with renal complications ([facility]) 81/03/3158  . Essential hypertension 11/01/2009   Qualifier: Diagnosis of  By: Selena Batten CMA, Jewel    . Gout 10/21/2018  . History of acute respiratory distress syndrome (ARDS) due to COVID-19 virus   . History of cardiac arrest 04/27/2019  . History of non-ST elevation myocardial infarction (NSTEMI) 04/18/2019  . history of respiratory arrest   . Hyperlipidemia   . Hypertension   . Mitral valve regurgitation 09/19/2015   Echo 09/2015   . 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 OSA treated with BiPAP 06/17/2010  . Protein-calorie malnutrition ([facility]) 06/01/2019   Poor PO intake, only eating 50% of meals, could benefit from feeding tube if persistently poor PO intake.  . Pulmonary hypertension ([facility]) 04/14/2019   RVSP 38 (TTE 2017) but repeat TTE March 2019 with RVSP 25 RAP in 2020: 44mhg  . Sleep apnea    cpap nightly    Past Surgical History:  Procedure Laterality Date  . AV FISTULA PLACEMENT Left 09/27/2014  . INSERTION OF ARTERIOVENOUS (AV) ARTEGRAFT ARM Left 04/02/2018   Procedure: INSERTION OF ARTERIOVENOUS (AV) ARTEGRAFT ARM LEFT UPPER ARM;  Surgeon: CWaynetta Sandy MD;  [facility]  Service: Vascular;  Laterality: Left;  . INSERTION OF DIALYSIS CATHETER N/A 04/02/2018   Procedure: INSERTION OF DIALYSIS CATHETER, right internal jugular;  Surgeon: CWaynetta Sandy MD;  [facility];  Service: Vascular;  Laterality: N/A;  . NECK SURGERY     C6 & C7 replaced 30 yrs ago per pt  . REVISON OF ARTERIOVENOUS FISTULA Left 04/02/2018   Procedure: REVISION PLICATION OF ARTERIOVENOUS FISTULA ARM;  Surgeon: Waynetta Sandy, MD;  Location: [facility];  Service: Vascular;  Laterality: Left;  . WISDOM TOOTH EXTRACTION       There were no vitals filed for this visit.     03/21/20 0808  Symptoms/Limitations  Subjective No new complaints. No falls or pain to report. Tremors "come and go".  Pertinent History CHF, HTN, ERSD  Limitations Standing;Walking;Writing;House hold activities;Lifting  How long can you sit comfortably? supported sitting no problem  How long can you stand comfortably? <5 min  How long can you walk comfortably? 20 feet with walker  Patient Stated Goals improve walking get stronger  Pain Assessment  Currently in Pain? No/denies  Pain Score 0      03/21/20 0809  Transfers  Transfers Sit to Stand;Stand to Sit  Sit to Stand 5: Supervision;With upper extremity assist;From chair/3-in-1;From bed  Stand to Sit 5: Supervision;With upper extremity assist;To chair/3-in-1;To bed  Ambulation/Gait  Ambulation/Gait Yes  Ambulation/Gait Assistance 5: Supervision;4: Min guard  Ambulation/Gait Assistance Details cues needed for posture and cane placement. no balance issues or tremors noted with long distance gait, however pt did have tremors with short distances between activities in session.   Ambulation Distance (Feet) 230 Feet (x1, plus around gym with session)  Assistive device Straight cane  Gait Pattern Decreased stride length;Decreased trunk rotation;Trunk flexed;Wide base of support;Poor foot clearance - left;Poor foot clearance - right  Ambulation Surface Level;Indoor  Stairs Yes  Stairs Assistance 4: Min guard  Stairs Assistance Details (indicate cue type and reason) no tremors with ascending stairs, mild tremors with descending with increased time needed. no significant balance loss noted.   Stair Management Technique One rail Right;Step to pattern;Forwards;Sideways  Number of Stairs 4  Height of Stairs 6  High Level Balance  High Level Balance Activities Negotiating over obstacles  High Level Balance Comments 4 bolsters in parallel bars- stepping over with step to pattern for 6  reps forward with decreased tremors noted and decreased hesitation as reps progressed. light UE support on bars with min assist downgrading to min guard assist for balance.                         03/21/20 0841  Moves in Standing  PWR! Up 15  PWR! Rock 15  PWR! Twist 15  PWR Step 15  Comments Modified with UE support       PT Short Term Goals - 03/01/20 1027      PT SHORT TERM GOAL #1   Title Pt will be able to ambulate 500 feet with quad cane with SBA to improve short term ambulation (all STG to be met by 03/29/20)    Baseline 115' with quad cane (02/29/20)    Time 4    Period Weeks    Status Achieved    Target Date 03/29/20      PT SHORT TERM GOAL #2   Title Patient will be able to come down steps facing forward to improve ability to negotiate 12 steps with bil rails    Baseline comes down backwards or scoots on buttocks (02/29/20)    Time 4    Period Weeks    Status Revised    Target Date  03/29/20      PT SHORT TERM GOAL #3   Title --    Baseline --    Time --    Period --    Status --    Target Date --      PT SHORT TERM GOAL #4   Title --    Baseline --    Time --    Period --    Status --    Target Date --      PT SHORT TERM GOAL #5   Title --             PT Long Term Goals - 02/29/20 1311      PT LONG TERM GOAL #1   Title Patient will be able to ambulate >1000 feet with quad cane to improve community ambulation    Baseline 20 feet with RW, 115' with quad cane (02/29/20)    Time 8    Period Weeks    Status Revised    Target Date 04/25/20      PT LONG TERM GOAL #2   Title Patient will be able to perform TUG <25 seconds with RW to improve overall functional mobility    Baseline 36 seconds with RW    Time 8    Period Weeks    Status Revised    Target Date 04/25/20      PT LONG TERM GOAL #3   Title Patient will be able to go up and down 17 steps with use of one rail with sBA to improve ability to go up and down steps    Baseline min A going  up and scoots down on his butt; 12 steps with bil hand rail (coming down backward)    Time 8    Period Weeks    Status On-going    Target Date 04/25/20      PT LONG TERM GOAL #4   Title Patient will demo improvement on Berg balance scale to at least 43/56 to improve functional balance and reduce fall risk    Baseline 30/56; 37/56 02/10/2020; 42/56 (02/29/20)    Time 8    Period Weeks    Status Revised    Target Date 04/25/20             03/21/20 0809  Plan  Clinical Impression Statement Today's skilled sesion continued to focus on gait with cane, stair negotiation and use of PWR! exercises to work on big, purposful movements with decreased tremors noted as reps progressed. The pt is progressing toward goals and should benefit from continued PT to progress toward unmet goals.  Personal Factors and Comorbidities Comorbidity 3+;Past/Current Experience;Time since onset of injury/illness/exacerbation;Transportation;Fitness;Age  Comorbidities CHF, ESRD, HTN  Examination-Activity Limitations Bathing;Bed Mobility;Caring for Others;Carry;Dressing;Hygiene/Grooming;Lift;Locomotion Level;Self Feeding;Sit;Squat;Stairs;Stand;Toileting;Transfers  Examination-Participation Restrictions Cleaning;Community Activity;Driving;Interpersonal Relationship;Laundry;Medication Management;Meal Prep;Shop;Yard Work  Pt will benefit from skilled therapeutic intervention in order to improve on the following deficits Abnormal gait;Decreased activity tolerance;Decreased balance;Decreased mobility;Decreased endurance;Decreased coordination;Decreased range of motion;Decreased safety awareness;Decreased strength;Difficulty walking;Impaired flexibility;Postural dysfunction;Impaired UE functional use;Impaired tone  Stability/Clinical Decision Making Evolving/Moderate complexity  Rehab Potential Good  PT Frequency 2x / week  PT Duration 8 weeks  PT Treatment/Interventions ADLs/Self Care Home Management;Gait training;Stair  training;Functional mobility training;Therapeutic activities;Therapeutic exercise;Balance training;Neuromuscular re-education;Patient/family education;Manual techniques;Passive range of motion;Joint Manipulations  PT Next Visit Plan Continue with PWR moves, progress gait with straight cane with rubber quad tip, continue to  work on stairs, balance reaction training  PT [place]  and Agree with Plan of Care Patient          Patient will benefit from skilled therapeutic intervention in order to improve the following deficits and impairments:  Abnormal gait, Decreased activity tolerance, Decreased balance, Decreased mobility, Decreased endurance, Decreased coordination, Decreased range of motion, Decreased safety awareness, Decreased strength, Difficulty walking, Impaired flexibility, Postural dysfunction, Impaired UE functional use, Impaired tone  Visit Diagnosis: Muscle weakness (generalized)  Unsteadiness on feet     Problem List Patient Active Problem List   Diagnosis Date Noted  . 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 Gait abnormality 08/12/2019  . History of gout 06/17/2019  . Bowel incontinence 06/09/2019  . Anoxic brain injury ([facility])   . History of cardiac arrest 04/27/2019  . History of non-ST elevation myocardial infarction (NSTEMI) 04/18/2019  . Morbid obesity ([facility]) 04/14/2019  . Diabetic neuropathy ([facility]) 04/02/2016  . Hypertension associated with diabetes ([facility]) 10/03/2015  . HLD (hyperlipidemia) 09/06/2015  . End stage renal disease on dialysis ([facility]) 08/31/2015  . DM (diabetes mellitus), type 2 with renal complications ([facility]) 09/81/1914  . OSA treated with BiPAP 12/12/2013  . Coronary atherosclerosis 11/01/2009    Willow Ora, PTA, [facility] [address] [place], [place] [address] [phone] 03/22/20, 7:04 PM   Name: Troy Wells MRN: [id number] Date of Birth: June 09, 1966

## 2020-03-23 ENCOUNTER — Encounter: Payer: Self-pay | Admitting: Physical Therapy

## 2020-03-23 ENCOUNTER — Other Ambulatory Visit: Payer: Self-pay

## 2020-03-23 ENCOUNTER — Ambulatory Visit: Payer: Medicare Other | Admitting: Physical Therapy

## 2020-03-23 DIAGNOSIS — R2681 Unsteadiness on feet: Secondary | ICD-10-CM

## 2020-03-23 DIAGNOSIS — M6281 Muscle weakness (generalized): Secondary | ICD-10-CM | POA: Diagnosis not present

## 2020-03-23 NOTE — Therapy (Signed)
[facility] [address] [place], [place], [address] Phone: [phone]   Fax:  [phone]  Physical Therapy Treatment  Patient Details  Name: Troy Wells MRN: [id number] Date of Birth: May 21, 1966 Referring Provider (PT): Dr. Andrena Mews   Encounter Date: 03/23/2020   PT End of Session - 03/23/20 0804    Visit Number 23    Number of Visits 32    Date for PT Re-Evaluation 04/25/20    Authorization Type Medicare, PN 9/92/42, Recert 6/83/41    Progress Note Due on Visit 30    PT Start Time 0802    PT Stop Time 0844    PT Time Calculation (min) 42 min    Equipment Utilized During Treatment Gait belt    Activity Tolerance Patient tolerated treatment well;No increased pain    Behavior During Therapy WFL for tasks assessed/performed           Past Medical History:  Diagnosis Date  . CARDIAC ARREST 11/01/2009   Qualifier: Diagnosis of  By: Selena Batten CMA, Jewel    . Coagulase negative Staphylococcus bacteremia 06/30/2019  . Colon polyps 01/02/2016   Colonoscopy July 2017 - One 3 mm polyp in the transverse colon, removed with a cold biopsy forceps. Resected and retrieved. - One 3 mm polyp in the rectum, removed with a cold biopsy forceps. Resected and retrieved. - Diverticulosis in the entire examined colon. - Non-bleeding internal hemorrhoids. - The examination was otherwise normal. - Significant looping which prolonged cecal    . Coronary artery disease 08/15/2009   with AMI  . Coronary atherosclerosis 11/01/2009   Qualifier: Diagnosis of  By: Selena Batten CMA, Jewel    . Dialysis patient ([facility]) 09/27/2014   mon, wed, and fri  . Diverticulosis of colon without hemorrhage 01/02/2016   Colonoscopy July 2017 - One 3 mm polyp in the transverse colon, removed with a cold biopsy forceps. Resected and retrieved. - One 3 mm polyp in the rectum, removed with a cold biopsy forceps. Resected and retrieved. -  Diverticulosis in the entire examined colon. - Non-bleeding internal hemorrhoids. - The examination was otherwise normal. - Significant looping which prolonged cecal    . DM (diabetes mellitus), type 2 with renal complications ([facility]) 96/22/2979  . Essential hypertension 11/01/2009   Qualifier: Diagnosis of  By: Selena Batten CMA, Jewel    . Gout 10/21/2018  . History of acute respiratory distress syndrome (ARDS) due to COVID-19 virus   . History of cardiac arrest 04/27/2019  . History of non-ST elevation myocardial infarction (NSTEMI) 04/18/2019  . history of respiratory arrest   . Hyperlipidemia   . Hypertension   . Mitral valve regurgitation 09/19/2015   Echo 09/2015   . 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 OSA treated with BiPAP 06/17/2010  . Protein-calorie malnutrition ([facility]) 06/01/2019   Poor PO intake, only eating 50% of meals, could benefit from feeding tube if persistently poor PO intake.  . Pulmonary hypertension ([facility]) 04/14/2019   RVSP 38 (TTE 2017) but repeat TTE March 2019 with RVSP 25 RAP in 2020: 20mhg  . Sleep apnea    cpap nightly    Past Surgical History:  Procedure Laterality Date  . AV FISTULA PLACEMENT Left 09/27/2014  . INSERTION OF ARTERIOVENOUS (AV) ARTEGRAFT ARM Left 04/02/2018   Procedure: INSERTION OF ARTERIOVENOUS (AV) ARTEGRAFT ARM LEFT UPPER ARM;  Surgeon: CWaynetta Sandy MD;  [facility]  Service: Vascular;  Laterality: Left;  . INSERTION OF DIALYSIS CATHETER N/A 04/02/2018  Procedure: INSERTION OF DIALYSIS CATHETER, right internal jugular;  Surgeon: Waynetta Sandy, MD;  Location: [facility];  Service: Vascular;  Laterality: N/A;  . NECK SURGERY     C6 & C7 replaced 30 yrs ago per pt  . REVISON OF ARTERIOVENOUS FISTULA Left 04/02/2018   Procedure: REVISION PLICATION OF ARTERIOVENOUS FISTULA ARM;  Surgeon: Waynetta Sandy, MD;  Location: [facility];  Service: Vascular;  Laterality: Left;  . WISDOM TOOTH EXTRACTION      There were no vitals  filed for this visit.   Subjective Assessment - 03/23/20 0803    Subjective No new complaitns. No falls or pain to report.    Pertinent History CHF, HTN, ERSD    Limitations Standing;Walking;Writing;House hold activities;Lifting    How long can you sit comfortably? supported sitting no problem    How long can you stand comfortably? <5 min    How long can you walk comfortably? 20 feet with walker    Patient Stated Goals improve walking get stronger    Currently in Pain? No/denies    Pain Score 0-No pain               [facility] Adult PT Treatment/Exercise - 03/23/20 0804      Transfers   Transfers Sit to Stand;Stand to Sit    Sit to Stand 5: Supervision;With upper extremity assist;From chair/3-in-1;From bed    Stand to Sit 5: Supervision;With upper extremity assist;To chair/3-in-1;To bed      Ambulation/Gait   Ambulation/Gait Yes    Ambulation/Gait Assistance 4: Min guard    Ambulation Distance (Feet) 115 Feet   x1, plus around gym   Assistive device Straight cane   with rubber quad tip   Gait Pattern Decreased stride length;Decreased trunk rotation;Trunk flexed;Wide base of support;Poor foot clearance - left;Poor foot clearance - right    Ambulation Surface Level;Indoor      Knee/Hip Exercises: Aerobic   Other Aerobic Scifit with UE/LE's level 3.0 x 8 minutes with goal >/= 50 rpm for strengthening and activity tolerance. SaO2 97% , HR 85  afterwards      Knee/Hip Exercises: Standing   Lateral Step Up Both;1 set;10 reps;Hand Hold: 2;Step Height: 6";Limitations    Lateral Step Up Limitations cues on form and technique, min guard assist for safety with bil UE support on bars    Forward Step Up Both;1 set;10 reps;Hand Hold: 2;Step Height: 6";Limitations    Forward Step Up Limitations cues on form and technique, min guard assit with bil UE support on bars             PWR [facility]) - 03/23/20 7322    PWR! Up 10    PWR! Rock 10    PWR! Twist 10    PWR! Step 10    Comments cues  needed for correct form and technique                 PT Short Term Goals - 03/01/20 1027      PT SHORT TERM GOAL #1   Title Pt will be able to ambulate 500 feet with quad cane with SBA to improve short term ambulation (all STG to be met by 03/29/20)    Baseline 115' with quad cane (02/29/20)    Time 4    Period Weeks    Status Achieved    Target Date 03/29/20      PT SHORT TERM GOAL #2   Title Patient will be able to come down steps  facing forward to improve ability to negotiate 12 steps with bil rails    Baseline comes down backwards or scoots on buttocks (02/29/20)    Time 4    Period Weeks    Status Revised    Target Date 03/29/20      PT SHORT TERM GOAL #3   Title --    Baseline --    Time --    Period --    Status --    Target Date --      PT SHORT TERM GOAL #4   Title --    Baseline --    Time --    Period --    Status --    Target Date --      PT SHORT TERM GOAL #5   Title --             PT Long Term Goals - 02/29/20 1311      PT LONG TERM GOAL #1   Title Patient will be able to ambulate >1000 feet with quad cane to improve community ambulation    Baseline 20 feet with RW, 115' with quad cane (02/29/20)    Time 8    Period Weeks    Status Revised    Target Date 04/25/20      PT LONG TERM GOAL #2   Title Patient will be able to perform TUG <25 seconds with RW to improve overall functional mobility    Baseline 36 seconds with RW    Time 8    Period Weeks    Status Revised    Target Date 04/25/20      PT LONG TERM GOAL #3   Title Patient will be able to go up and down 17 steps with use of one rail with sBA to improve ability to go up and down steps    Baseline min A going up and scoots down on his butt; 12 steps with bil hand rail (coming down backward)    Time 8    Period Weeks    Status On-going    Target Date 04/25/20      PT LONG TERM GOAL #4   Title Patient will demo improvement on Berg balance scale to at least 43/56 to improve  functional balance and reduce fall risk    Baseline 30/56; 37/56 02/10/2020; 42/56 (02/29/20)    Time 8    Period Weeks    Status Revised    Target Date 04/25/20                 Plan - 03/23/20 0804    Clinical Impression Statement Today's skilled session continued to focus on use of cane with gait, strengthening and use of PWR! ex's to encourage larger movements and address intention tremors. Pt with increased tremors in LE"s today with all activities vs with previous sessions with increased time needed with activities. The pt is progressing toward goals and should benefit from continued PT to progress toward unmet goals.    Personal Factors and Comorbidities Comorbidity 3+;Past/Current Experience;Time since onset of injury/illness/exacerbation;Transportation;Fitness;Age    Comorbidities CHF, ESRD, HTN    Examination-Activity Limitations Bathing;Bed Mobility;Caring for Others;Carry;Dressing;Hygiene/Grooming;Lift;Locomotion Level;Self Feeding;Sit;Squat;Stairs;Stand;Toileting;Transfers    Examination-Participation Restrictions Cleaning;Community Activity;Driving;Interpersonal Relationship;Laundry;Medication Management;Meal Prep;Shop;Yard Work    Stability/Clinical Decision Making Evolving/Moderate complexity    Rehab Potential Good    PT Frequency 2x / week    PT Duration 8 weeks    PT Treatment/Interventions ADLs/Self Care Home Management;Gait training;Stair training;Functional mobility training;Therapeutic  activities;Therapeutic exercise;Balance training;Neuromuscular re-education;Patient/family education;Manual techniques;Passive range of motion;Joint Manipulations    PT Next Visit Plan Continue with PWR moves, progress gait with straight cane with rubber quad tip, continue to  work on stairs, balance reaction training    PT [place] and Agree with Plan of Care Patient           Patient will benefit from skilled therapeutic intervention in  order to improve the following deficits and impairments:  Abnormal gait, Decreased activity tolerance, Decreased balance, Decreased mobility, Decreased endurance, Decreased coordination, Decreased range of motion, Decreased safety awareness, Decreased strength, Difficulty walking, Impaired flexibility, Postural dysfunction, Impaired UE functional use, Impaired tone  Visit Diagnosis: Muscle weakness (generalized)  Unsteadiness on feet     Problem List Patient Active Problem List   Diagnosis Date Noted  . 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 Gait abnormality 08/12/2019  . History of gout 06/17/2019  . Bowel incontinence 06/09/2019  . Anoxic brain injury ([facility])   . History of cardiac arrest 04/27/2019  . History of non-ST elevation myocardial infarction (NSTEMI) 04/18/2019  . Morbid obesity ([facility]) 04/14/2019  . Diabetic neuropathy ([facility]) 04/02/2016  . Hypertension associated with diabetes ([facility]) 10/03/2015  . HLD (hyperlipidemia) 09/06/2015  . End stage renal disease on dialysis ([facility]) 08/31/2015  . DM (diabetes mellitus), type 2 with renal complications ([facility]) 81/15/7262  . OSA treated with BiPAP 12/12/2013  . Coronary atherosclerosis 11/01/2009    Willow Ora, PTA, [facility] [address] [place], [place] [address] [phone] 03/23/20, 9:46 AM   Name: Troy Wells MRN: [id number] Date of Birth: 04/06/1966

## 2020-03-24 DIAGNOSIS — N2581 Secondary hyperparathyroidism of renal origin: Secondary | ICD-10-CM | POA: Diagnosis not present

## 2020-03-24 DIAGNOSIS — N186 End stage renal disease: Secondary | ICD-10-CM | POA: Diagnosis not present

## 2020-03-24 DIAGNOSIS — Z992 Dependence on renal dialysis: Secondary | ICD-10-CM | POA: Diagnosis not present

## 2020-03-24 DIAGNOSIS — E1121 Type 2 diabetes mellitus with diabetic nephropathy: Secondary | ICD-10-CM | POA: Diagnosis not present

## 2020-03-24 DIAGNOSIS — D631 Anemia in chronic kidney disease: Secondary | ICD-10-CM | POA: Diagnosis not present

## 2020-03-27 DIAGNOSIS — N2581 Secondary hyperparathyroidism of renal origin: Secondary | ICD-10-CM | POA: Diagnosis not present

## 2020-03-27 DIAGNOSIS — D631 Anemia in chronic kidney disease: Secondary | ICD-10-CM | POA: Diagnosis not present

## 2020-03-27 DIAGNOSIS — E1121 Type 2 diabetes mellitus with diabetic nephropathy: Secondary | ICD-10-CM | POA: Diagnosis not present

## 2020-03-27 DIAGNOSIS — N186 End stage renal disease: Secondary | ICD-10-CM | POA: Diagnosis not present

## 2020-03-27 DIAGNOSIS — Z992 Dependence on renal dialysis: Secondary | ICD-10-CM | POA: Diagnosis not present

## 2020-03-28 ENCOUNTER — Encounter: Payer: Self-pay | Admitting: Physical Therapy

## 2020-03-28 ENCOUNTER — Ambulatory Visit: Payer: Medicare Other | Admitting: Physical Therapy

## 2020-03-28 ENCOUNTER — Other Ambulatory Visit: Payer: Self-pay

## 2020-03-28 DIAGNOSIS — M6281 Muscle weakness (generalized): Secondary | ICD-10-CM

## 2020-03-28 DIAGNOSIS — R2681 Unsteadiness on feet: Secondary | ICD-10-CM

## 2020-03-28 NOTE — Therapy (Signed)
[facility] [address] [place], [place], [address] Phone: [phone]   Fax:  [phone]  Physical Therapy Treatment  Patient Details  Name: Troy Wells MRN: [id number] Date of Birth: Nov 13, 1965 Referring Provider (PT): Dr. Andrena Mews   Encounter Date: 03/28/2020   PT End of Session - 03/28/20 0807    Visit Number 24    Number of Visits 32    Date for PT Re-Evaluation 04/25/20    Authorization Type Medicare, PN 10/20/67, Recert 7/94/80    Progress Note Due on Visit 30    PT Start Time 0804    PT Stop Time 0845    PT Time Calculation (min) 41 min    Equipment Utilized During Treatment Gait belt    Activity Tolerance Patient tolerated treatment well;No increased pain    Behavior During Therapy WFL for tasks assessed/performed           Past Medical History:  Diagnosis Date  . CARDIAC ARREST 11/01/2009   Qualifier: Diagnosis of  By: Selena Batten CMA, Jewel    . Coagulase negative Staphylococcus bacteremia 06/30/2019  . Colon polyps 01/02/2016   Colonoscopy July 2017 - One 3 mm polyp in the transverse colon, removed with a cold biopsy forceps. Resected and retrieved. - One 3 mm polyp in the rectum, removed with a cold biopsy forceps. Resected and retrieved. - Diverticulosis in the entire examined colon. - Non-bleeding internal hemorrhoids. - The examination was otherwise normal. - Significant looping which prolonged cecal    . Coronary artery disease 08/15/2009   with AMI  . Coronary atherosclerosis 11/01/2009   Qualifier: Diagnosis of  By: Selena Batten CMA, Jewel    . Dialysis patient ([facility]) 09/27/2014   mon, wed, and fri  . Diverticulosis of colon without hemorrhage 01/02/2016   Colonoscopy July 2017 - One 3 mm polyp in the transverse colon, removed with a cold biopsy forceps. Resected and retrieved. - One 3 mm polyp in the rectum, removed with a cold biopsy forceps. Resected and retrieved. -  Diverticulosis in the entire examined colon. - Non-bleeding internal hemorrhoids. - The examination was otherwise normal. - Significant looping which prolonged cecal    . DM (diabetes mellitus), type 2 with renal complications ([facility]) 16/55/3748  . Essential hypertension 11/01/2009   Qualifier: Diagnosis of  By: Selena Batten CMA, Jewel    . Gout 10/21/2018  . History of acute respiratory distress syndrome (ARDS) due to COVID-19 virus   . History of cardiac arrest 04/27/2019  . History of non-ST elevation myocardial infarction (NSTEMI) 04/18/2019  . history of respiratory arrest   . Hyperlipidemia   . Hypertension   . Mitral valve regurgitation 09/19/2015   Echo 09/2015   . 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 OSA treated with BiPAP 06/17/2010  . Protein-calorie malnutrition ([facility]) 06/01/2019   Poor PO intake, only eating 50% of meals, could benefit from feeding tube if persistently poor PO intake.  . Pulmonary hypertension ([facility]) 04/14/2019   RVSP 38 (TTE 2017) but repeat TTE March 2019 with RVSP 25 RAP in 2020: 85mhg  . Sleep apnea    cpap nightly    Past Surgical History:  Procedure Laterality Date  . AV FISTULA PLACEMENT Left 09/27/2014  . INSERTION OF ARTERIOVENOUS (AV) ARTEGRAFT ARM Left 04/02/2018   Procedure: INSERTION OF ARTERIOVENOUS (AV) ARTEGRAFT ARM LEFT UPPER ARM;  Surgeon: CWaynetta Sandy MD;  [facility]  Service: Vascular;  Laterality: Left;  . INSERTION OF DIALYSIS CATHETER N/A 04/02/2018  Procedure: INSERTION OF DIALYSIS CATHETER, right internal jugular;  Surgeon: Waynetta Sandy, MD;  Location: [facility];  Service: Vascular;  Laterality: N/A;  . NECK SURGERY     C6 & C7 replaced 30 yrs ago per pt  . REVISON OF ARTERIOVENOUS FISTULA Left 04/02/2018   Procedure: REVISION PLICATION OF ARTERIOVENOUS FISTULA ARM;  Surgeon: Waynetta Sandy, MD;  Location: [facility];  Service: Vascular;  Laterality: Left;  . WISDOM TOOTH EXTRACTION      There were no vitals  filed for this visit.   Subjective Assessment - 03/28/20 0806    Subjective No new complaitns. No falls or pain to report.    Pertinent History CHF, HTN, ERSD    Limitations Standing;Walking;Writing;House hold activities;Lifting    How long can you sit comfortably? supported sitting no problem    How long can you stand comfortably? <5 min    How long can you walk comfortably? 20 feet with walker    Patient Stated Goals improve walking get stronger    Currently in Pain? No/denies    Pain Score 0-No pain                [facility] Adult PT Treatment/Exercise - 03/28/20 0808      Transfers   Transfers Sit to Stand;Stand to Sit    Sit to Stand 5: Supervision;With upper extremity assist;From chair/3-in-1;From bed    Stand to Sit 5: Supervision;With upper extremity assist;To chair/3-in-1;To bed      Ambulation/Gait   Ambulation/Gait Yes    Ambulation/Gait Assistance 4: Min guard    Ambulation/Gait Assistance Details cues on posture, cane placement and step length. 2 standing rest breaks with propping on wall with minor tremors in LE's on restarting that decreased as gait progressed.     Ambulation Distance (Feet) 530 Feet   x1   Assistive device Straight cane   with rubber quad tip   Gait Pattern Decreased stride length;Decreased trunk rotation;Trunk flexed;Wide base of support;Poor foot clearance - left;Poor foot clearance - right    Ambulation Surface Level;Indoor    Stairs Yes    Stairs Assistance 4: Min guard    Stairs Assistance Details (indicate cue type and reason) no tremors with ascending, increased time needed. mild tremors with descending with increased time needed, no loss of balance noted.     Stair Management Technique Two rails;Step to pattern;Forwards    Number of Stairs 4    Height of Stairs 6             PWR [facility]) - 03/28/20 0834    PWR! Up 15    PWR! Rock 15    PWR! Twist 15    PWR Step 15    Comments modified with UE support as needed with PWR! Day! Step. cues needed on posture and ex form/technique.                  PT Short Term Goals - 03/28/20 0807      PT SHORT TERM GOAL #1   Title Pt will be able to ambulate 500 feet with quad cane with SBA to improve short term ambulation (all STG to be met by 03/29/20)    Baseline 03/28/2020: met in session today with min guard assist with cane with rubber quad tip    Time --    Period --    Status Achieved    Target Date 03/29/20      PT SHORT TERM GOAL #2  Title Patient will be able to come down steps facing forward to improve ability to negotiate 12 steps with bil rails    Baseline 03/28/20: met in session today with bil rails, min guard assist.    Time --    Period --    Status Achieved    Target Date 03/29/20             PT Long Term Goals - 02/29/20 1311      PT LONG TERM GOAL #1   Title Patient will be able to ambulate >1000 feet with quad cane to improve community ambulation    Baseline 20 feet with RW, 115' with quad cane (02/29/20)    Time 8    Period Weeks    Status Revised    Target Date 04/25/20      PT LONG TERM GOAL #2   Title Patient will be able to perform TUG <25 seconds with RW to improve overall functional mobility    Baseline 36 seconds with RW    Time 8    Period Weeks    Status Revised    Target Date 04/25/20      PT LONG TERM GOAL #3   Title Patient will be able to go up and down 17 steps with use of one rail with sBA to improve ability to go up and down steps    Baseline min A going up and scoots down on his butt; 12 steps with bil hand rail (coming down backward)    Time 8    Period Weeks    Status On-going    Target Date 04/25/20      PT LONG TERM GOAL #4   Title Patient will demo improvement on Berg balance scale to at least 43/56 to improve functional balance and reduce fall risk    Baseline 30/56; 37/56 02/10/2020; 42/56 (02/29/20)    Time 8    Period Weeks    Status Revised    Target Date 04/25/20                  Plan - 03/28/20 0807    Clinical Impression Statement Today's skilled session focused on progress toward STGs with both goals met. Remainder of session continued with use of PWR! standing ex's to addressed larger amplitude movements to overcome intentional tremors. Pt able to move both UE/LE together today with less tremors noted, increased time to get started/initate new movements. The pt is progressing toward goals and should benefit from continued PT to progress toward unmet goals    Personal Factors and Comorbidities Comorbidity 3+;Past/Current Experience;Time since onset of injury/illness/exacerbation;Transportation;Fitness;Age    Comorbidities CHF, ESRD, HTN    Examination-Activity Limitations Bathing;Bed Mobility;Caring for Others;Carry;Dressing;Hygiene/Grooming;Lift;Locomotion Level;Self Feeding;Sit;Squat;Stairs;Stand;Toileting;Transfers    Examination-Participation Restrictions Cleaning;Community Activity;Driving;Interpersonal Relationship;Laundry;Medication Management;Meal Prep;Shop;Yard Work    Stability/Clinical Decision Making Evolving/Moderate complexity    Rehab Potential Good    PT Frequency 2x / week    PT Duration 8 weeks    PT Treatment/Interventions ADLs/Self Care Home Management;Gait training;Stair training;Functional mobility training;Therapeutic activities;Therapeutic exercise;Balance training;Neuromuscular re-education;Patient/family education;Manual techniques;Passive range of motion;Joint Manipulations    PT Next Visit Plan Continue with PWR moves, progress gait with straight cane with rubber quad tip, continue to  work on stairs, balance reaction training    PT [place] and Agree with Plan of Care Patient           Patient will benefit from skilled therapeutic intervention in order  to improve the following deficits and impairments:  Abnormal gait, Decreased activity tolerance, Decreased balance, Decreased  mobility, Decreased endurance, Decreased coordination, Decreased range of motion, Decreased safety awareness, Decreased strength, Difficulty walking, Impaired flexibility, Postural dysfunction, Impaired UE functional use, Impaired tone  Visit Diagnosis: Muscle weakness (generalized)  Unsteadiness on feet     Problem List Patient Active Problem List   Diagnosis Date Noted  . 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 Gait abnormality 08/12/2019  . History of gout 06/17/2019  . Bowel incontinence 06/09/2019  . Anoxic brain injury ([place])   . History of cardiac arrest 04/27/2019  . History of non-ST elevation myocardial infarction (NSTEMI) 04/18/2019  . Morbid obesity ([place]) 04/14/2019  . Diabetic neuropathy ([place]) 04/02/2016  . Hypertension associated with diabetes ([place]) 10/03/2015  . HLD (hyperlipidemia) 09/06/2015  . End stage renal disease on dialysis ([place]) 08/31/2015  . DM (diabetes mellitus), type 2 with renal complications ([place]) 38/25/0539  . OSA treated with BiPAP 12/12/2013  . Coronary atherosclerosis 11/01/2009    Willow Ora, PTA, [facility] [address] [place], [place] [address] [phone] 03/28/20, 9:23 AM   Name: Winslow Ederer MRN: [id number] Date of Birth: [birth year]

## 2020-03-29 DIAGNOSIS — D631 Anemia in chronic kidney disease: Secondary | ICD-10-CM | POA: Diagnosis not present

## 2020-03-29 DIAGNOSIS — Z992 Dependence on renal dialysis: Secondary | ICD-10-CM | POA: Diagnosis not present

## 2020-03-29 DIAGNOSIS — N186 End stage renal disease: Secondary | ICD-10-CM | POA: Diagnosis not present

## 2020-03-29 DIAGNOSIS — E1121 Type 2 diabetes mellitus with diabetic nephropathy: Secondary | ICD-10-CM | POA: Diagnosis not present

## 2020-03-29 DIAGNOSIS — N2581 Secondary hyperparathyroidism of renal origin: Secondary | ICD-10-CM | POA: Diagnosis not present

## 2020-03-30 ENCOUNTER — Ambulatory Visit: Payer: Medicare Other | Admitting: Physical Therapy

## 2020-03-30 ENCOUNTER — Other Ambulatory Visit: Payer: Self-pay

## 2020-03-30 ENCOUNTER — Encounter: Payer: Self-pay | Admitting: Physical Therapy

## 2020-03-30 DIAGNOSIS — R2681 Unsteadiness on feet: Secondary | ICD-10-CM | POA: Diagnosis not present

## 2020-03-30 DIAGNOSIS — M6281 Muscle weakness (generalized): Secondary | ICD-10-CM

## 2020-03-31 DIAGNOSIS — N2581 Secondary hyperparathyroidism of renal origin: Secondary | ICD-10-CM | POA: Diagnosis not present

## 2020-03-31 DIAGNOSIS — N186 End stage renal disease: Secondary | ICD-10-CM | POA: Diagnosis not present

## 2020-03-31 DIAGNOSIS — E1121 Type 2 diabetes mellitus with diabetic nephropathy: Secondary | ICD-10-CM | POA: Diagnosis not present

## 2020-03-31 DIAGNOSIS — D631 Anemia in chronic kidney disease: Secondary | ICD-10-CM | POA: Diagnosis not present

## 2020-03-31 DIAGNOSIS — Z992 Dependence on renal dialysis: Secondary | ICD-10-CM | POA: Diagnosis not present

## 2020-03-31 NOTE — Therapy (Signed)
[facility] [address] [place], [place], [address] Phone: [phone]   Fax:  [phone]  Physical Therapy Treatment  Patient Details  Name: Troy Wells MRN: [id number] Date of Birth: 1966-04-05 Referring Provider (PT): Dr. Andrena Mews   Encounter Date: 03/30/2020   PT End of Session - 03/30/20 0808    Visit Number 25    Number of Visits 32    Date for PT Re-Evaluation 04/25/20    Authorization Type Medicare, PN 6/96/29, Recert 11/12/39    Progress Note Due on Visit 30    PT Start Time 0805    PT Stop Time 3244    PT Time Calculation (min) 39 min    Equipment Utilized During Treatment Gait belt    Activity Tolerance Patient tolerated treatment well;No increased pain    Behavior During Therapy WFL for tasks assessed/performed           Past Medical History:  Diagnosis Date  . CARDIAC ARREST 11/01/2009   Qualifier: Diagnosis of  By: Selena Batten CMA, [place]    . Coagulase negative Staphylococcus bacteremia 06/30/2019  . Colon polyps 01/02/2016   Colonoscopy July 2017 - One 3 mm polyp in the transverse colon, removed with a cold biopsy forceps. Resected and retrieved. - One 3 mm polyp in the rectum, removed with a cold biopsy forceps. Resected and retrieved. - Diverticulosis in the entire examined colon. - Non-bleeding internal hemorrhoids. - The examination was otherwise normal. - Significant looping which prolonged cecal    . Coronary artery disease 08/15/2009   with AMI  . Coronary atherosclerosis 11/01/2009   Qualifier: Diagnosis of  By: Selena Batten CMA, Jewel    . Dialysis patient ([place]) 09/27/2014   mon, wed, and fri  . Diverticulosis of colon without hemorrhage 01/02/2016   Colonoscopy July 2017 - One 3 mm polyp in the transverse colon, removed with a cold biopsy forceps. Resected and retrieved. - One 3 mm polyp in the rectum, removed with a cold biopsy forceps. Resected and retrieved. -  Diverticulosis in the entire examined colon. - Non-bleeding internal hemorrhoids. - The examination was otherwise normal. - Significant looping which prolonged cecal    . DM (diabetes mellitus), type 2 with renal complications ([place]) 06/19/7251  . Essential hypertension 11/01/2009   Qualifier: Diagnosis of  By: Selena Batten CMA, Jewel    . Gout 10/21/2018  . History of acute respiratory distress syndrome (ARDS) due to COVID-19 virus   . History of cardiac arrest 04/27/2019  . History of non-ST elevation myocardial infarction (NSTEMI) 04/18/2019  . history of respiratory arrest   . Hyperlipidemia   . Hypertension   . Mitral valve regurgitation 09/19/2015   Echo 09/2015   . 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 OSA treated with BiPAP 06/17/2010  . Protein-calorie malnutrition ([facility]) 06/01/2019   Poor PO intake, only eating 50% of meals, could benefit from feeding tube if persistently poor PO intake.  . Pulmonary hypertension ([place]) 04/14/2019   RVSP 38 (TTE 2017) but repeat TTE March 2019 with RVSP 25 RAP in 2020: 46mhg  . Sleep apnea    cpap nightly    Past Surgical History:  Procedure Laterality Date  . AV FISTULA PLACEMENT Left 09/27/2014  . INSERTION OF ARTERIOVENOUS (AV) ARTEGRAFT ARM Left 04/02/2018   Procedure: INSERTION OF ARTERIOVENOUS (AV) ARTEGRAFT ARM LEFT UPPER ARM;  Surgeon: CWaynetta Sandy MD;  Location: [facility]  Service: Vascular;  Laterality: Left;  . INSERTION OF DIALYSIS CATHETER N/A 04/02/2018  Procedure: INSERTION OF DIALYSIS CATHETER, right internal jugular;  Surgeon: Waynetta Sandy, MD;  Location: [facility];  Service: Vascular;  Laterality: N/A;  . NECK SURGERY     C6 & C7 replaced 30 yrs ago per pt  . REVISON OF ARTERIOVENOUS FISTULA Left 04/02/2018   Procedure: REVISION PLICATION OF ARTERIOVENOUS FISTULA ARM;  Surgeon: Waynetta Sandy, MD;  Location: [facility];  Service: Vascular;  Laterality: Left;  . WISDOM TOOTH EXTRACTION      There were no vitals  filed for this visit.   Subjective Assessment - 03/30/20 0807    Subjective No new complaitns. No falls or pain to report. Walking more with cane at home.    Pertinent History CHF, HTN, ERSD    Limitations Standing;Walking;Writing;House hold activities;Lifting    How long can you sit comfortably? supported sitting no problem    How long can you stand comfortably? <5 min    How long can you walk comfortably? 20 feet with walker    Patient Stated Goals improve walking get stronger    Currently in Pain? No/denies    Pain Score 0-No pain                 [facility] Adult PT Treatment/Exercise - 03/30/20 0809      Transfers   Transfers Sit to Stand;Stand to Sit    Sit to Stand 5: Supervision;With upper extremity assist;From chair/3-in-1;From bed    Stand to Sit 5: Supervision;With upper extremity assist;To chair/3-in-1;To bed      Ambulation/Gait   Ambulation/Gait Yes    Ambulation/Gait Assistance 4: Min guard    Ambulation/Gait Assistance Details HHA needed to initiate gait/get going due to LE tremors, as gait progressed pt did not need HHA, only cane support. cues on posture, step length and cane placement with gait.     Ambulation Distance (Feet) 230 Feet   x1, plus around gym   Assistive device Straight cane   with rubber quad tip   Gait Pattern Decreased stride length;Decreased trunk rotation;Trunk flexed;Wide base of support;Poor foot clearance - left;Poor foot clearance - right    Ambulation Surface Level;Indoor      High Level Balance   High Level Balance Activities Negotiating over obstacles    High Level Balance Comments 4 bolsters in parallel bars- stepping over with step to pattern for 6 reps forward with decreased tremors noted and decreased hesitation as reps progressed. light UE support on bars with min guard assist for balance.                         Knee/Hip Exercises: Aerobic   Other Aerobic Scifit with UE/LE's level 4.0 x 8 minutes with goal >/= 50 rpm for  strengthening and activity tolerance. SaO2 97% , HR 85  afterwards               Balance Exercises - 03/30/20 0829      Balance Exercises: Standing   Tandem Gait Forward;Retro;Upper extremity support;3 reps;Limitations    Tandem Gait Limitations on floor in parallel bars with light UE support on bars for 3 laps each way, increased time with some steps needed due to tremors, cues for form/posture.     Sidestepping Upper extremity support;3 reps;Limitations    Sidestepping Limitations on floor in parallel bars with light UE support on bars. cues for form and technique. increased time for stepping at times due to tremors in LE's.  PT Short Term Goals - 03/28/20 0807      PT SHORT TERM GOAL #1   Title Pt will be able to ambulate 500 feet with quad cane with SBA to improve short term ambulation (all STG to be met by 03/29/20)    Baseline 03/28/2020: met in session today with min guard assist with cane with rubber quad tip    Time --    Period --    Status Achieved    Target Date 03/29/20      PT SHORT TERM GOAL #2   Title Patient will be able to come down steps facing forward to improve ability to negotiate 12 steps with bil rails    Baseline 03/28/20: met in session today with bil rails, min guard assist.    Time --    Period --    Status Achieved    Target Date 03/29/20             PT Long Term Goals - 02/29/20 1311      PT LONG TERM GOAL #1   Title Patient will be able to ambulate >1000 feet with quad cane to improve community ambulation    Baseline 20 feet with RW, 115' with quad cane (02/29/20)    Time 8    Period Weeks    Status Revised    Target Date 04/25/20      PT LONG TERM GOAL #2   Title Patient will be able to perform TUG <25 seconds with RW to improve overall functional mobility    Baseline 36 seconds with RW    Time 8    Period Weeks    Status Revised    Target Date 04/25/20      PT LONG TERM GOAL #3   Title Patient will be able  to go up and down 17 steps with use of one rail with sBA to improve ability to go up and down steps    Baseline min A going up and scoots down on his butt; 12 steps with bil hand rail (coming down backward)    Time 8    Period Weeks    Status On-going    Target Date 04/25/20      PT LONG TERM GOAL #4   Title Patient will demo improvement on Berg balance scale to at least 43/56 to improve functional balance and reduce fall risk    Baseline 30/56; 37/56 02/10/2020; 42/56 (02/29/20)    Time 8    Period Weeks    Status Revised    Target Date 04/25/20                 Plan - 03/30/20 0808    Clinical Impression Statement Today's skilled session continued to focus on gait with cane, strengthening and balance reactions with rest breaks taken as needed. Pt continues to have LE tremors when presented with new tasks and with fatigued. With repitition of the task the tremors decrease. The pt is progressing toward goals and should benefit from continued PT to progress toward unmet goals.    Personal Factors and Comorbidities Comorbidity 3+;Past/Current Experience;Time since onset of injury/illness/exacerbation;Transportation;Fitness;Age    Comorbidities CHF, ESRD, HTN    Examination-Activity Limitations Bathing;Bed Mobility;Caring for Others;Carry;Dressing;Hygiene/Grooming;Lift;Locomotion Level;Self Feeding;Sit;Squat;Stairs;Stand;Toileting;Transfers    Examination-Participation Restrictions Cleaning;Community Activity;Driving;Interpersonal Relationship;Laundry;Medication Management;Meal Prep;Shop;Yard Work    Stability/Clinical Decision Making Evolving/Moderate complexity    Rehab Potential Good    PT Frequency 2x / week    PT Duration 8 weeks  PT Treatment/Interventions ADLs/Self Care Home Management;Gait training;Stair training;Functional mobility training;Therapeutic activities;Therapeutic exercise;Balance training;Neuromuscular re-education;Patient/family education;Manual techniques;Passive  range of motion;Joint Manipulations    PT Next Visit Plan Continue with PWR moves, progress gait with straight cane with rubber quad tip, continue to  work on stairs, balance reaction training    PT [place] and Agree with Plan of Care Patient           Patient will benefit from skilled therapeutic intervention in order to improve the following deficits and impairments:  Abnormal gait, Decreased activity tolerance, Decreased balance, Decreased mobility, Decreased endurance, Decreased coordination, Decreased range of motion, Decreased safety awareness, Decreased strength, Difficulty walking, Impaired flexibility, Postural dysfunction, Impaired UE functional use, Impaired tone  Visit Diagnosis: Muscle weakness (generalized)  Unsteadiness on feet     Problem List Patient Active Problem List   Diagnosis Date Noted  . 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 Gait abnormality 08/12/2019  . History of gout 06/17/2019  . Bowel incontinence 06/09/2019  . Anoxic brain injury ([facility])   . History of cardiac arrest 04/27/2019  . History of non-ST elevation myocardial infarction (NSTEMI) 04/18/2019  . Morbid obesity ([facility]) 04/14/2019  . Diabetic neuropathy ([place]) 04/02/2016  . Hypertension associated with diabetes ([place]) 10/03/2015  . HLD (hyperlipidemia) 09/06/2015  . End stage renal disease on dialysis ([place]) 08/31/2015  . DM (diabetes mellitus), type 2 with renal complications ([place]) 41/58/3094  . OSA treated with BiPAP 12/12/2013  . Coronary atherosclerosis 11/01/2009    Willow Ora, PTA, [facility] [address], [place],  [address] [phone] 03/31/20, 9:41 AM   Name: Troy Wells MRN: [id number] Date of Birth: 1966/01/18

## 2020-04-03 DIAGNOSIS — Z992 Dependence on renal dialysis: Secondary | ICD-10-CM | POA: Diagnosis not present

## 2020-04-03 DIAGNOSIS — D631 Anemia in chronic kidney disease: Secondary | ICD-10-CM | POA: Diagnosis not present

## 2020-04-03 DIAGNOSIS — E1121 Type 2 diabetes mellitus with diabetic nephropathy: Secondary | ICD-10-CM | POA: Diagnosis not present

## 2020-04-03 DIAGNOSIS — N186 End stage renal disease: Secondary | ICD-10-CM | POA: Diagnosis not present

## 2020-04-03 DIAGNOSIS — N2581 Secondary hyperparathyroidism of renal origin: Secondary | ICD-10-CM | POA: Diagnosis not present

## 2020-04-04 ENCOUNTER — Encounter: Payer: Self-pay | Admitting: Physical Therapy

## 2020-04-04 ENCOUNTER — Other Ambulatory Visit: Payer: Self-pay

## 2020-04-04 ENCOUNTER — Ambulatory Visit: Payer: Medicare Other | Admitting: Physical Therapy

## 2020-04-04 DIAGNOSIS — M6281 Muscle weakness (generalized): Secondary | ICD-10-CM | POA: Diagnosis not present

## 2020-04-04 DIAGNOSIS — R2681 Unsteadiness on feet: Secondary | ICD-10-CM | POA: Diagnosis not present

## 2020-04-05 ENCOUNTER — Ambulatory Visit (INDEPENDENT_AMBULATORY_CARE_PROVIDER_SITE_OTHER): Payer: Medicare Other | Admitting: Podiatry

## 2020-04-05 ENCOUNTER — Encounter: Payer: Self-pay | Admitting: Podiatry

## 2020-04-05 DIAGNOSIS — M79674 Pain in right toe(s): Secondary | ICD-10-CM

## 2020-04-05 DIAGNOSIS — D631 Anemia in chronic kidney disease: Secondary | ICD-10-CM | POA: Diagnosis not present

## 2020-04-05 DIAGNOSIS — Z992 Dependence on renal dialysis: Secondary | ICD-10-CM

## 2020-04-05 DIAGNOSIS — N186 End stage renal disease: Secondary | ICD-10-CM | POA: Diagnosis not present

## 2020-04-05 DIAGNOSIS — B351 Tinea unguium: Secondary | ICD-10-CM | POA: Diagnosis not present

## 2020-04-05 DIAGNOSIS — N2581 Secondary hyperparathyroidism of renal origin: Secondary | ICD-10-CM | POA: Diagnosis not present

## 2020-04-05 DIAGNOSIS — E1122 Type 2 diabetes mellitus with diabetic chronic kidney disease: Secondary | ICD-10-CM | POA: Diagnosis not present

## 2020-04-05 DIAGNOSIS — M79675 Pain in left toe(s): Secondary | ICD-10-CM | POA: Diagnosis not present

## 2020-04-05 DIAGNOSIS — E1121 Type 2 diabetes mellitus with diabetic nephropathy: Secondary | ICD-10-CM | POA: Diagnosis not present

## 2020-04-05 NOTE — Progress Notes (Signed)
  Subjective:  Patient ID: Troy Wells, male    DOB: 1966-03-18,  MRN: [id number]  Chief Complaint  Patient presents with  . Diabetic foot care    Nail Care   54 y.o. male returns for the above complaint.  Patient presents with thickened elongated dystrophic toenails x10.  Patient states it painful to touch.  He would like to have it debrided down.  He denies seeing anyone else prior to seeing me.  He is to do self debridement prior to seeing me.  He is a diabetic with last A1c of 5.7.  Objective:  There were no vitals filed for this visit. Podiatric Exam: Vascular: dorsalis pedis and posterior tibial pulses are palpable bilateral. Capillary return is immediate. Temperature gradient is WNL. Skin turgor WNL  Sensorium: Normal Semmes Weinstein monofilament test. Normal tactile sensation bilaterally. Nail Exam: Pt has thick disfigured discolored nails with subungual debris noted bilateral entire nail hallux through fifth toenails.  Pain on palpation to the nails. Ulcer Exam: There is no evidence of ulcer or pre-ulcerative changes or infection. Orthopedic Exam: Muscle tone and strength are WNL. No limitations in general ROM. No crepitus or effusions noted. HAV  B/L.  Hammer toes 2-5  B/L. Skin: No Porokeratosis. No infection or ulcers    Assessment & Plan:  No diagnosis found.  Patient was evaluated and treated and all questions answered.  Onychomycosis with pain  -Nails palliatively debrided as below. -Educated on self-care  Procedure: Nail Debridement Rationale: pain  Type of Debridement: manual, sharp debridement. Instrumentation: Nail nipper, rotary burr. Number of Nails: 10  Procedures and Treatment: Consent by patient was obtained for treatment procedures. The patient understood the discussion of treatment and procedures well. All questions were answered thoroughly reviewed. Debridement of mycotic and hypertrophic toenails, 1 through 5 bilateral and clearing of subungual  debris. No ulceration, no infection noted.  Return Visit-Office Procedure: Patient instructed to return to the office for a follow up visit 3 months for continued evaluation and treatment.  Boneta Lucks, DPM    No follow-ups on file.

## 2020-04-05 NOTE — Therapy (Signed)
[facility] [address] [place], [place], [address] Phone: [phone]   Fax:  [phone]  Physical Therapy Treatment  Patient Details  Name: Kasra Melvin MRN: [id number] Date of Birth: 03/06/1966 Referring Provider (PT): Dr. Andrena Mews   Encounter Date: 04/04/2020   PT End of Session - 04/04/20 0807    Visit Number 26    Number of Visits 32    Date for PT Re-Evaluation 04/25/20    Authorization Type Medicare, PN 02/13/92, Recert 12/31/94    Progress Note Due on Visit 30    PT Start Time 0803    PT Stop Time 0843    PT Time Calculation (min) 40 min    Equipment Utilized During Treatment Gait belt    Activity Tolerance Patient tolerated treatment well;No increased pain    Behavior During Therapy WFL for tasks assessed/performed           Past Medical History:  Diagnosis Date  . CARDIAC ARREST 11/01/2009   Qualifier: Diagnosis of  By: Selena Batten CMA, Jewel    . Coagulase negative Staphylococcus bacteremia 06/30/2019  . Colon polyps 01/02/2016   Colonoscopy July 2017 - One 3 mm polyp in the transverse colon, removed with a cold biopsy forceps. Resected and retrieved. - One 3 mm polyp in the rectum, removed with a cold biopsy forceps. Resected and retrieved. - Diverticulosis in the entire examined colon. - Non-bleeding internal hemorrhoids. - The examination was otherwise normal. - Significant looping which prolonged cecal    . Coronary artery disease 08/15/2009   with AMI  . Coronary atherosclerosis 11/01/2009   Qualifier: Diagnosis of  By: Selena Batten CMA, Jewel    . Dialysis patient ([facility]) 09/27/2014   mon, wed, and fri  . Diverticulosis of colon without hemorrhage 01/02/2016   Colonoscopy July 2017 - One 3 mm polyp in the transverse colon, removed with a cold biopsy forceps. Resected and retrieved. - One 3 mm polyp in the rectum, removed with a cold biopsy forceps. Resected and retrieved. -  Diverticulosis in the entire examined colon. - Non-bleeding internal hemorrhoids. - The examination was otherwise normal. - Significant looping which prolonged cecal    . DM (diabetes mellitus), type 2 with renal complications ([facility]) 78/93/8101  . Essential hypertension 11/01/2009   Qualifier: Diagnosis of  By: Selena Batten CMA, Jewel    . Gout 10/21/2018  . History of acute respiratory distress syndrome (ARDS) due to COVID-19 virus   . History of cardiac arrest 04/27/2019  . History of non-ST elevation myocardial infarction (NSTEMI) 04/18/2019  . history of respiratory arrest   . Hyperlipidemia   . Hypertension   . Mitral valve regurgitation 09/19/2015   Echo 09/2015   . 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 OSA treated with BiPAP 06/17/2010  . Protein-calorie malnutrition ([facility]) 06/01/2019   Poor PO intake, only eating 50% of meals, could benefit from feeding tube if persistently poor PO intake.  . Pulmonary hypertension ([facility]) 04/14/2019   RVSP 38 (TTE 2017) but repeat TTE March 2019 with RVSP 25 RAP in 2020: 41mhg  . Sleep apnea    cpap nightly    Past Surgical History:  Procedure Laterality Date  . AV FISTULA PLACEMENT Left 09/27/2014  . INSERTION OF ARTERIOVENOUS (AV) ARTEGRAFT ARM Left 04/02/2018   Procedure: INSERTION OF ARTERIOVENOUS (AV) ARTEGRAFT ARM LEFT UPPER ARM;  Surgeon: CWaynetta Sandy MD;  [facility]  Service: Vascular;  Laterality: Left;  . INSERTION OF DIALYSIS CATHETER N/A 04/02/2018  Procedure: INSERTION OF DIALYSIS CATHETER, right internal jugular;  Surgeon: Waynetta Sandy, MD;  Location: [facility];  Service: Vascular;  Laterality: N/A;  . NECK SURGERY     C6 & C7 replaced 30 yrs ago per pt  . REVISON OF ARTERIOVENOUS FISTULA Left 04/02/2018   Procedure: REVISION PLICATION OF ARTERIOVENOUS FISTULA ARM;  Surgeon: Waynetta Sandy, MD;  Location: [facility];  Service: Vascular;  Laterality: Left;  . WISDOM TOOTH EXTRACTION      There were no vitals  filed for this visit.   Subjective Assessment - 04/04/20 0807    Subjective No new complaints. No falls or pain to report.    Pertinent History CHF, HTN, ERSD    Limitations Standing;Walking;Writing;House hold activities;Lifting    How long can you sit comfortably? supported sitting no problem    How long can you stand comfortably? <5 min    How long can you walk comfortably? 20 feet with walker    Patient Stated Goals improve walking get stronger    Currently in Pain? No/denies    Pain Score 0-No pain                   [facility] Adult PT Treatment/Exercise - 04/04/20 0809      Transfers   Transfers Sit to Stand;Stand to Sit    Sit to Stand 5: Supervision;With upper extremity assist;From chair/3-in-1;From bed    Stand to Sit 5: Supervision;With upper extremity assist;To chair/3-in-1;To bed      Ambulation/Gait   Ambulation/Gait Yes    Ambulation/Gait Assistance 4: Min guard    Ambulation/Gait Assistance Details reminder cues for upright posture, cane placement and for forward gaze vs looking at floor with gait.     Ambulation Distance (Feet) 115 Feet   x1, plus around gym with session   Assistive device Straight cane   with rubber quad tip   Gait Pattern Decreased stride length;Decreased trunk rotation;Trunk flexed;Wide base of support;Poor foot clearance - left;Poor foot clearance - right    Ambulation Surface Level;Indoor      High Level Balance   High Level Balance Activities Negotiating over obstacles    High Level Balance Comments 4 bolsters in parallel bars- stepping over with step to pattern for 4 laps forward, then laterally for 2 laps toward each side with decreased tremors noted and decreased hesitation as reps progressed. light UE support on bars with min guard assist for balance.                         Knee/Hip Exercises: Aerobic   Other Aerobic Scifit with UE/LE's level 4.5 x 8 minutes with goal >/= 50 rpm for strengthening and activity tolerance. SaO2 97% , HR  85  afterwards      Knee/Hip Exercises: Standing   Lateral Step Up Both;1 set;10 reps;Hand Hold: 2;Step Height: 6";Limitations    Lateral Step Up Limitations cues on form and technique, min guard assist for safety with bil UE support on bars    Forward Step Up Both;1 set;10 reps;Hand Hold: 2;Step Height: 6";Limitations    Forward Step Up Limitations cues on form and technique, min guard assit with bil UE support on bars               Balance Exercises - 04/04/20 0838      Balance Exercises: Standing   Rockerboard Anterior/posterior;EO;EC;30 seconds;5 reps;Other reps (comment);Limitations;Intermittent UE support    Rockerboard Limitations on balance board with light  to no UE support: holding the board steady for alternating UE raises, progressing to bil UE raises with min guard to min assist. with arms at sides/hovering over bars for EC 30 sec's x 3 reps, progressing to EC head movements left<>right, then up<>down with up to min assist for balance. cues needed on posture/weight shifting to assist with balance.                PT Short Term Goals - 03/28/20 0807      PT SHORT TERM GOAL #1   Title Pt will be able to ambulate 500 feet with quad cane with SBA to improve short term ambulation (all STG to be met by 03/29/20)    Baseline 03/28/2020: met in session today with min guard assist with cane with rubber quad tip    Time --    Period --    Status Achieved    Target Date 03/29/20      PT SHORT TERM GOAL #2   Title Patient will be able to come down steps facing forward to improve ability to negotiate 12 steps with bil rails    Baseline 03/28/20: met in session today with bil rails, min guard assist.    Time --    Period --    Status Achieved    Target Date 03/29/20             PT Long Term Goals - 02/29/20 1311      PT LONG TERM GOAL #1   Title Patient will be able to ambulate >1000 feet with quad cane to improve community ambulation    Baseline 20 feet with RW,  115' with quad cane (02/29/20)    Time 8    Period Weeks    Status Revised    Target Date 04/25/20      PT LONG TERM GOAL #2   Title Patient will be able to perform TUG <25 seconds with RW to improve overall functional mobility    Baseline 36 seconds with RW    Time 8    Period Weeks    Status Revised    Target Date 04/25/20      PT LONG TERM GOAL #3   Title Patient will be able to go up and down 17 steps with use of one rail with sBA to improve ability to go up and down steps    Baseline min A going up and scoots down on his butt; 12 steps with bil hand rail (coming down backward)    Time 8    Period Weeks    Status On-going    Target Date 04/25/20      PT LONG TERM GOAL #4   Title Patient will demo improvement on Berg balance scale to at least 43/56 to improve functional balance and reduce fall risk    Baseline 30/56; 37/56 02/10/2020; 42/56 (02/29/20)    Time 8    Period Weeks    Status Revised    Target Date 04/25/20                 Plan - 04/04/20 0807    Clinical Impression Statement Today's skilled session continued to focus on gait with cane, strengthening and balance reactions. Decreased LE tremors this session with activity. The pt is making steady progress and should benefit from continued PT to progress toward unmet goals.    Personal Factors and Comorbidities Comorbidity 3+;Past/Current Experience;Time since onset of injury/illness/exacerbation;Transportation;Fitness;Age    Comorbidities CHF, ESRD,  HTN    Examination-Activity Limitations Bathing;Bed Mobility;Caring for Others;Carry;Dressing;Hygiene/Grooming;Lift;Locomotion Level;Self Feeding;Sit;Squat;Stairs;Stand;Toileting;Transfers    Examination-Participation Restrictions Cleaning;Community Activity;Driving;Interpersonal Relationship;Laundry;Medication Management;Meal Prep;Shop;Yard Work    Stability/Clinical Decision Making Evolving/Moderate complexity    Rehab Potential Good    PT Frequency 2x / week     PT Duration 8 weeks    PT Treatment/Interventions ADLs/Self Care Home Management;Gait training;Stair training;Functional mobility training;Therapeutic activities;Therapeutic exercise;Balance training;Neuromuscular re-education;Patient/family education;Manual techniques;Passive range of motion;Joint Manipulations    PT Next Visit Plan Continue with PWR moves, progress gait with straight cane with rubber quad tip, continue to  work on stairs, balance reaction training    PT [place] and Agree with Plan of Care Patient           Patient will benefit from skilled therapeutic intervention in order to improve the following deficits and impairments:  Abnormal gait, Decreased activity tolerance, Decreased balance, Decreased mobility, Decreased endurance, Decreased coordination, Decreased range of motion, Decreased safety awareness, Decreased strength, Difficulty walking, Impaired flexibility, Postural dysfunction, Impaired UE functional use, Impaired tone  Visit Diagnosis: Muscle weakness (generalized)  Unsteadiness on feet     Problem List Patient Active Problem List   Diagnosis Date Noted  . 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 Gait abnormality 08/12/2019  . History of gout 06/17/2019  . Bowel incontinence 06/09/2019  . Anoxic brain injury ([facility])   . History of cardiac arrest 04/27/2019  . History of non-ST elevation myocardial infarction (NSTEMI) 04/18/2019  . Morbid obesity ([facility]) 04/14/2019  . Diabetic neuropathy ([facility]) 04/02/2016  . Hypertension associated with diabetes ([facility]) 10/03/2015  . HLD (hyperlipidemia) 09/06/2015  . End stage renal disease on dialysis ([facility]) 08/31/2015  . DM (diabetes mellitus), type 2 with renal complications ([facility]) 26/27/0048  . OSA treated with BiPAP 12/12/2013  . Coronary atherosclerosis 11/01/2009    Willow Ora, PTA, [facility] [address] [place], [place]  [id number] [phone] 04/05/20, 1:07 PM   Name: Travion Ke MRN: [id number] Date of Birth: May 03, 1966

## 2020-04-06 ENCOUNTER — Ambulatory Visit: Payer: Medicare Other | Admitting: Physical Therapy

## 2020-04-06 ENCOUNTER — Other Ambulatory Visit: Payer: Self-pay

## 2020-04-06 ENCOUNTER — Encounter: Payer: Self-pay | Admitting: Physical Therapy

## 2020-04-06 DIAGNOSIS — R2681 Unsteadiness on feet: Secondary | ICD-10-CM

## 2020-04-06 DIAGNOSIS — M6281 Muscle weakness (generalized): Secondary | ICD-10-CM

## 2020-04-06 NOTE — Therapy (Signed)
[facility] [address] [place], [place], [address] Phone: [phone]   Fax:  [phone]  Physical Therapy Treatment  Patient Details  Name: Troy Wells MRN: [id number] Date of Birth: Nov 27, 1965 Referring Provider (PT): Dr. Andrena Mews   Encounter Date: 04/06/2020   PT End of Session - 04/06/20 0807    Visit Number 27    Number of Visits 32    Date for PT Re-Evaluation 04/25/20    Authorization Type Medicare, PN 5/83/09, Recert 09/22/66    Progress Note Due on Visit 30    PT Start Time 0805    PT Stop Time 0881    PT Time Calculation (min) 39 min    Equipment Utilized During Treatment Gait belt    Activity Tolerance Patient tolerated treatment well;No increased pain    Behavior During Therapy WFL for tasks assessed/performed           Past Medical History:  Diagnosis Date  . CARDIAC ARREST 11/01/2009   Qualifier: Diagnosis of  By: Selena Batten CMA, Jewel    . Coagulase negative Staphylococcus bacteremia 06/30/2019  . Colon polyps 01/02/2016   Colonoscopy July 2017 - One 3 mm polyp in the transverse colon, removed with a cold biopsy forceps. Resected and retrieved. - One 3 mm polyp in the rectum, removed with a cold biopsy forceps. Resected and retrieved. - Diverticulosis in the entire examined colon. - Non-bleeding internal hemorrhoids. - The examination was otherwise normal. - Significant looping which prolonged cecal    . Coronary artery disease 08/15/2009   with AMI  . Coronary atherosclerosis 11/01/2009   Qualifier: Diagnosis of  By: Selena Batten CMA, Jewel    . Dialysis patient ([facility]) 09/27/2014   mon, wed, and fri  . Diverticulosis of colon without hemorrhage 01/02/2016   Colonoscopy July 2017 - One 3 mm polyp in the transverse colon, removed with a cold biopsy forceps. Resected and retrieved. - One 3 mm polyp in the rectum, removed with a cold biopsy forceps. Resected and retrieved. -  Diverticulosis in the entire examined colon. - Non-bleeding internal hemorrhoids. - The examination was otherwise normal. - Significant looping which prolonged cecal    . DM (diabetes mellitus), type 2 with renal complications ([facility]) 04/16/5944  . Essential hypertension 11/01/2009   Qualifier: Diagnosis of  By: Selena Batten CMA, Jewel    . Gout 10/21/2018  . History of acute respiratory distress syndrome (ARDS) due to COVID-19 virus   . History of cardiac arrest 04/27/2019  . History of non-ST elevation myocardial infarction (NSTEMI) 04/18/2019  . history of respiratory arrest   . Hyperlipidemia   . Hypertension   . Mitral valve regurgitation 09/19/2015   Echo 09/2015   . 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 OSA treated with BiPAP 06/17/2010  . Protein-calorie malnutrition ([facility]) 06/01/2019   Poor PO intake, only eating 50% of meals, could benefit from feeding tube if persistently poor PO intake.  . Pulmonary hypertension ([facility]) 04/14/2019   RVSP 38 (TTE 2017) but repeat TTE March 2019 with RVSP 25 RAP in 2020: 60mhg  . Sleep apnea    cpap nightly    Past Surgical History:  Procedure Laterality Date  . AV FISTULA PLACEMENT Left 09/27/2014  . INSERTION OF ARTERIOVENOUS (AV) ARTEGRAFT ARM Left 04/02/2018   Procedure: INSERTION OF ARTERIOVENOUS (AV) ARTEGRAFT ARM LEFT UPPER ARM;  Surgeon: CWaynetta Sandy MD;  [facility]  Service: Vascular;  Laterality: Left;  . INSERTION OF DIALYSIS CATHETER N/A 04/02/2018  Procedure: INSERTION OF DIALYSIS CATHETER, right internal jugular;  Surgeon: Waynetta Sandy, MD;  Location: [facility];  Service: Vascular;  Laterality: N/A;  . NECK SURGERY     C6 & C7 replaced 30 yrs ago per pt  . REVISON OF ARTERIOVENOUS FISTULA Left 04/02/2018   Procedure: REVISION PLICATION OF ARTERIOVENOUS FISTULA ARM;  Surgeon: Waynetta Sandy, MD;  Location: [facility];  Service: Vascular;  Laterality: Left;  . WISDOM TOOTH EXTRACTION      There were no vitals  filed for this visit.   Subjective Assessment - 04/06/20 0807    Subjective No new complaints. No falls or pain to report.    Pertinent History CHF, HTN, ERSD    Limitations Standing;Walking;Writing;House hold activities;Lifting    How long can you sit comfortably? supported sitting no problem    How long can you stand comfortably? <5 min    How long can you walk comfortably? 20 feet with walker    Patient Stated Goals improve walking get stronger    Currently in Pain? No/denies    Pain Score 0-No pain                    [facility] Adult PT Treatment/Exercise - 04/06/20 0808      Transfers   Transfers Sit to Stand;Stand to Sit    Sit to Stand 5: Supervision;With upper extremity assist;From chair/3-in-1;From bed    Stand to Sit 5: Supervision;With upper extremity assist;To chair/3-in-1;To bed      Ambulation/Gait   Ambulation/Gait Yes    Ambulation/Gait Assistance 4: Min guard    Ambulation/Gait Assistance Details HR 116 after longer distance of gait with cane.  cues with all gait to look forward, not at ground and for cane placement so not to trip over it with right foot.     Ambulation Distance (Feet) 115 Feet   x1, 350 x1   Assistive device Straight cane   with rubber quad tip   Gait Pattern Decreased stride length;Decreased trunk rotation;Trunk flexed;Wide base of support;Poor foot clearance - left;Poor foot clearance - right    Ambulation Surface Level;Indoor    Stairs Yes    Stairs Assistance 4: Min guard    Stairs Assistance Details (indicate cue type and reason) pt needs increased time to initiate movements due to intention tremors, no balance loss noted with no physical assistance needed. min guard assist for safety with cues on technique. used two rails on 1st rep, then just right rail on next to reps as this is how his home stairs are set up. increased effort needed with single rail.     Stair Management Technique Two rails;One rail Right;Step to  pattern;Forwards;Sideways    Number of Stairs 4   x3 reps   Ramp 4: Min assist    Ramp Details (indicate cue type and reason) with cane with min assist needed. pt with several "freezing" episoides with tremors present. these did decrease with second rep.     Curb 4: Min assist    Curb Details (indicate cue type and reason) with cane with min assist, increased time and effort. cues on sequencing.     Gait Comments HR up to 120 after ramp/curb with cane for 1st rep. decreased with seated rest break.  HR 110 after 2cd rep with curb/ramp with cane.       Knee/Hip Exercises: Aerobic   Other Aerobic Scifit with UE/LE's level 5.0 x 5 minutes with goal >/= 50 rpm for strengthening and  activity tolerance. SaO2 97% , HR  110 afterwards. decreased to 90's with rest.                     PT Short Term Goals - 03/28/20 0807      PT SHORT TERM GOAL #1   Title Pt will be able to ambulate 500 feet with quad cane with SBA to improve short term ambulation (all STG to be met by 03/29/20)    Baseline 03/28/2020: met in session today with min guard assist with cane with rubber quad tip    Time --    Period --    Status Achieved    Target Date 03/29/20      PT SHORT TERM GOAL #2   Title Patient will be able to come down steps facing forward to improve ability to negotiate 12 steps with bil rails    Baseline 03/28/20: met in session today with bil rails, min guard assist.    Time --    Period --    Status Achieved    Target Date 03/29/20             PT Long Term Goals - 02/29/20 1311      PT LONG TERM GOAL #1   Title Patient will be able to ambulate >1000 feet with quad cane to improve community ambulation    Baseline 20 feet with RW, 115' with quad cane (02/29/20)    Time 8    Period Weeks    Status Revised    Target Date 04/25/20      PT LONG TERM GOAL #2   Title Patient will be able to perform TUG <25 seconds with RW to improve overall functional mobility    Baseline 36 seconds  with RW    Time 8    Period Weeks    Status Revised    Target Date 04/25/20      PT LONG TERM GOAL #3   Title Patient will be able to go up and down 17 steps with use of one rail with sBA to improve ability to go up and down steps    Baseline min A going up and scoots down on his butt; 12 steps with bil hand rail (coming down backward)    Time 8    Period Weeks    Status On-going    Target Date 04/25/20      PT LONG TERM GOAL #4   Title Patient will demo improvement on Berg balance scale to at least 43/56 to improve functional balance and reduce fall risk    Baseline 30/56; 37/56 02/10/2020; 42/56 (02/29/20)    Time 8    Period Weeks    Status Revised    Target Date 04/25/20                 Plan - 04/06/20 0807    Clinical Impression Statement Today's skilled session continued to focus on LE strengthening and gait with cane. Pt with less tremors with gait this session. Initiated ramp/curbs this session with cane with up to min assist needed for balance with cues on technique/sequencing. Continued with stair training with increased time needed for movement initiation, less tremors noted. The pt is progressing toward goals and should benefit from continued PT to progress toward unmet goals.    Personal Factors and Comorbidities Comorbidity 3+;Past/Current Experience;Time since onset of injury/illness/exacerbation;Transportation;Fitness;Age    Comorbidities CHF, ESRD, HTN    Examination-Activity Limitations Bathing;Bed Mobility;Caring for  Others;Carry;Dressing;Hygiene/Grooming;Lift;Locomotion Level;Self Feeding;Sit;Squat;Stairs;Stand;Toileting;Transfers    Examination-Participation Restrictions Cleaning;Community Activity;Driving;Interpersonal Relationship;Laundry;Medication Management;Meal Prep;Shop;Yard Work    Stability/Clinical Decision Making Evolving/Moderate complexity    Rehab Potential Good    PT Frequency 2x / week    PT Duration 8 weeks    PT Treatment/Interventions  ADLs/Self Care Home Management;Gait training;Stair training;Functional mobility training;Therapeutic activities;Therapeutic exercise;Balance training;Neuromuscular re-education;Patient/family education;Manual techniques;Passive range of motion;Joint Manipulations    PT Next Visit Plan review and advance HEP; gait with cane on compliant surfaces indoors, ? outdoors as well, continue with ramp/curbs with cane    PT Home Exercise Plan [facility] [id number]    Consulted and Agree with Plan of Care Patient           Patient will benefit from skilled therapeutic intervention in order to improve the following deficits and impairments:  Abnormal gait, Decreased activity tolerance, Decreased balance, Decreased mobility, Decreased endurance, Decreased coordination, Decreased range of motion, Decreased safety awareness, Decreased strength, Difficulty walking, Impaired flexibility, Postural dysfunction, Impaired UE functional use, Impaired tone  Visit Diagnosis: Muscle weakness (generalized)  Unsteadiness on feet     Problem List Patient Active Problem List   Diagnosis Date Noted  . 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 Gait abnormality 08/12/2019  . History of gout 06/17/2019  . Bowel incontinence 06/09/2019  . Anoxic brain injury ([facility])   . History of cardiac arrest 04/27/2019  . History of non-ST elevation myocardial infarction (NSTEMI) 04/18/2019  . Morbid obesity ([facility]) 04/14/2019  . Diabetic neuropathy ([facility]) 04/02/2016  . Hypertension associated with diabetes ([facility]) 10/03/2015  . HLD (hyperlipidemia) 09/06/2015  . End stage renal disease on dialysis ([facility]) 08/31/2015  . DM (diabetes mellitus), type 2 with renal complications ([facility]) 50/35/4656  . OSA treated with BiPAP 12/12/2013  . Coronary atherosclerosis 11/01/2009   Willow Ora, PTA, [facility] [address] [place], [place] [address] [phone] 04/06/20, 9:57 PM   Name: Troy Wells MRN:  [id number] Date of Birth: 1966-03-19

## 2020-04-07 DIAGNOSIS — N186 End stage renal disease: Secondary | ICD-10-CM | POA: Diagnosis not present

## 2020-04-07 DIAGNOSIS — N2581 Secondary hyperparathyroidism of renal origin: Secondary | ICD-10-CM | POA: Diagnosis not present

## 2020-04-07 DIAGNOSIS — Z992 Dependence on renal dialysis: Secondary | ICD-10-CM | POA: Diagnosis not present

## 2020-04-07 DIAGNOSIS — D631 Anemia in chronic kidney disease: Secondary | ICD-10-CM | POA: Diagnosis not present

## 2020-04-07 DIAGNOSIS — E1121 Type 2 diabetes mellitus with diabetic nephropathy: Secondary | ICD-10-CM | POA: Diagnosis not present

## 2020-04-10 DIAGNOSIS — Z992 Dependence on renal dialysis: Secondary | ICD-10-CM | POA: Diagnosis not present

## 2020-04-10 DIAGNOSIS — E1121 Type 2 diabetes mellitus with diabetic nephropathy: Secondary | ICD-10-CM | POA: Diagnosis not present

## 2020-04-10 DIAGNOSIS — N2581 Secondary hyperparathyroidism of renal origin: Secondary | ICD-10-CM | POA: Diagnosis not present

## 2020-04-10 DIAGNOSIS — D631 Anemia in chronic kidney disease: Secondary | ICD-10-CM | POA: Diagnosis not present

## 2020-04-10 DIAGNOSIS — N186 End stage renal disease: Secondary | ICD-10-CM | POA: Diagnosis not present

## 2020-04-11 ENCOUNTER — Other Ambulatory Visit: Payer: Self-pay

## 2020-04-11 ENCOUNTER — Encounter: Payer: Self-pay | Admitting: Physical Therapy

## 2020-04-11 ENCOUNTER — Ambulatory Visit: Payer: Medicare Other | Admitting: Physical Therapy

## 2020-04-11 DIAGNOSIS — R2681 Unsteadiness on feet: Secondary | ICD-10-CM | POA: Diagnosis not present

## 2020-04-11 DIAGNOSIS — M6281 Muscle weakness (generalized): Secondary | ICD-10-CM

## 2020-04-11 NOTE — Therapy (Signed)
[facility] [address] [place], [place], [address] Phone: [phone]   Fax:  [phone]  Physical Therapy Treatment  Patient Details  Name: Troy Wells MRN: [id number] Date of Birth: 04-09-1966 Referring Provider (PT): Dr. Andrena Mews   Encounter Date: 04/11/2020   PT End of Session - 04/11/20 0808    Visit Number 28    Number of Visits 32    Date for PT Re-Evaluation 04/25/20    Authorization Type Medicare, PN 5/79/03, Recert 8/33/38    Progress Note Due on Visit 30    PT Start Time 0805    PT Stop Time 0845    PT Time Calculation (min) 40 min    Equipment Utilized During Treatment Gait belt    Activity Tolerance Patient tolerated treatment well;No increased pain    Behavior During Therapy WFL for tasks assessed/performed           Past Medical History:  Diagnosis Date  . CARDIAC ARREST 11/01/2009   Qualifier: Diagnosis of  By: Selena Batten CMA, Jewel    . Coagulase negative Staphylococcus bacteremia 06/30/2019  . Colon polyps 01/02/2016   Colonoscopy July 2017 - One 3 mm polyp in the transverse colon, removed with a cold biopsy forceps. Resected and retrieved. - One 3 mm polyp in the rectum, removed with a cold biopsy forceps. Resected and retrieved. - Diverticulosis in the entire examined colon. - Non-bleeding internal hemorrhoids. - The examination was otherwise normal. - Significant looping which prolonged cecal    . Coronary artery disease 08/15/2009   with AMI  . Coronary atherosclerosis 11/01/2009   Qualifier: Diagnosis of  By: Selena Batten CMA, Jewel    . Dialysis patient ([place]) 09/27/2014   mon, wed, and fri  . Diverticulosis of colon without hemorrhage 01/02/2016   Colonoscopy July 2017 - One 3 mm polyp in the transverse colon, removed with a cold biopsy forceps. Resected and retrieved. - One 3 mm polyp in the rectum, removed with a cold biopsy forceps. Resected and retrieved. -  Diverticulosis in the entire examined colon. - Non-bleeding internal hemorrhoids. - The examination was otherwise normal. - Significant looping which prolonged cecal    . DM (diabetes mellitus), type 2 with renal complications ([facility]) 32/91/9166  . Essential hypertension 11/01/2009   Qualifier: Diagnosis of  By: Selena Batten CMA, Jewel    . Gout 10/21/2018  . History of acute respiratory distress syndrome (ARDS) due to COVID-19 virus   . History of cardiac arrest 04/27/2019  . History of non-ST elevation myocardial infarction (NSTEMI) 04/18/2019  . history of respiratory arrest   . Hyperlipidemia   . Hypertension   . Mitral valve regurgitation 09/19/2015   Echo 09/2015   . 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 OSA treated with BiPAP 06/17/2010  . Protein-calorie malnutrition ([place]) 06/01/2019   Poor PO intake, only eating 50% of meals, could benefit from feeding tube if persistently poor PO intake.  . Pulmonary hypertension ([place]) 04/14/2019   RVSP 38 (TTE 2017) but repeat TTE March 2019 with RVSP 25 RAP in 2020: 65mhg  . Sleep apnea    cpap nightly    Past Surgical History:  Procedure Laterality Date  . AV FISTULA PLACEMENT Left 09/27/2014  . INSERTION OF ARTERIOVENOUS (AV) ARTEGRAFT ARM Left 04/02/2018   Procedure: INSERTION OF ARTERIOVENOUS (AV) ARTEGRAFT ARM LEFT UPPER ARM;  Surgeon: CWaynetta Sandy MD;  Location[facility]  Service: Vascular;  Laterality: Left;  . INSERTION OF DIALYSIS CATHETER N/A 04/02/2018  Procedure: INSERTION OF DIALYSIS CATHETER, right internal jugular;  Surgeon: Waynetta Sandy, MD;  Location: [facility];  Service: Vascular;  Laterality: N/A;  . NECK SURGERY     C6 & C7 replaced 30 yrs ago per pt  . REVISON OF ARTERIOVENOUS FISTULA Left 04/02/2018   Procedure: REVISION PLICATION OF ARTERIOVENOUS FISTULA ARM;  Surgeon: Waynetta Sandy, MD;  Location: [facility];  Service: Vascular;  Laterality: Left;  . WISDOM TOOTH EXTRACTION      There were no vitals  filed for this visit.   Subjective Assessment - 04/11/20 0808    Subjective No new complaints. No falls or pain to report.    Pertinent History CHF, HTN, ERSD    Limitations Standing;Walking;Writing;House hold activities;Lifting    How long can you sit comfortably? supported sitting no problem    How long can you stand comfortably? <5 min    How long can you walk comfortably? 20 feet with walker    Patient Stated Goals improve walking get stronger    Currently in Pain? No/denies    Pain Score 0-No pain                  [facility] Adult PT Treatment/Exercise - 04/11/20 0839      Transfers   Transfers Sit to Stand;Stand to Sit    Sit to Stand 5: Supervision;With upper extremity assist;From chair/3-in-1;From bed    Stand to Sit 5: Supervision;With upper extremity assist;To chair/3-in-1;To bed      Ambulation/Gait   Ambulation/Gait Yes    Ambulation/Gait Assistance 4: Min guard    Ambulation/Gait Assistance Details around gym with session    Assistive device Straight cane    Gait Pattern Decreased stride length;Decreased trunk rotation;Trunk flexed;Wide base of support;Poor foot clearance - left;Poor foot clearance - right    Ambulation Surface Level;Indoor      Exercises   Exercises Other Exercises    Other Exercises  reviewed and revised HEP. Refer to [facility] [place] [facility] for full details. Min guard assist with balance ex's for safety.           Issued the following to pt's HEP today:  Access Code: [email or web address] URL: [email or web address] Date: 04/11/2020 Prepared by: Willow Ora  Exercises Side Stepping with Counter Support - 1 x daily - 7 x weekly - 2 sets Standing Hip Abduction with Resistance at Ankles and Counter Support - 1 x daily - 5 x weekly - 1 sets - 10 reps Standing Hip Extension with Resistance at Ankles and Counter Support - 1 x daily - 5 x weekly - 1 sets - 10 reps Standing Hip Flexion with Resistance Loop - 1 x daily - 7 x weekly - 2 sets - 10  reps Standing Single Leg Stance with Counter Support - 1 x daily - 5 x weekly - 1 sets - 3 reps - 10 hold Tandem Walking with Counter Support - 1 x daily - 5 x weekly - 1 sets - 3 reps Standing Foot Tap on Box (BKA) - 1 x daily - 5 x weekly - 1 sets - 10 reps       PT Education - 04/12/20 0000    Education Details updated/advanced pt's HEP today    Person(s) Educated Patient    Methods Explanation;Demonstration;Verbal cues;Handout    Comprehension Verbalized understanding;Verbal cues required;Need further instruction            PT Short Term Goals - 03/28/20 0807      PT SHORT TERM GOAL #1  Title Pt will be able to ambulate 500 feet with quad cane with SBA to improve short term ambulation (all STG to be met by 03/29/20)    Baseline 03/28/2020: met in session today with min guard assist with cane with rubber quad tip    Time --    Period --    Status Achieved    Target Date 03/29/20      PT SHORT TERM GOAL #2   Title Patient will be able to come down steps facing forward to improve ability to negotiate 12 steps with bil rails    Baseline 03/28/20: met in session today with bil rails, min guard assist.    Time --    Period --    Status Achieved    Target Date 03/29/20             PT Long Term Goals - 02/29/20 1311      PT LONG TERM GOAL #1   Title Patient will be able to ambulate >1000 feet with quad cane to improve community ambulation    Baseline 20 feet with RW, 115' with quad cane (02/29/20)    Time 8    Period Weeks    Status Revised    Target Date 04/25/20      PT LONG TERM GOAL #2   Title Patient will be able to perform TUG <25 seconds with RW to improve overall functional mobility    Baseline 36 seconds with RW    Time 8    Period Weeks    Status Revised    Target Date 04/25/20      PT LONG TERM GOAL #3   Title Patient will be able to go up and down 17 steps with use of one rail with sBA to improve ability to go up and down steps    Baseline min A  going up and scoots down on his butt; 12 steps with bil hand rail (coming down backward)    Time 8    Period Weeks    Status On-going    Target Date 04/25/20      PT LONG TERM GOAL #4   Title Patient will demo improvement on Berg balance scale to at least 43/56 to improve functional balance and reduce fall risk    Baseline 30/56; 37/56 02/10/2020; 42/56 (02/29/20)    Time 8    Period Weeks    Status Revised    Target Date 04/25/20                 Plan - 04/11/20 0808    Clinical Impression Statement Today's skilled session focused on review and advancement of pt's HEP. No issues reported or noted with performance in session today. The pt is making steady progress toward goals and should benefit from continued PT to progress toward unmet goals.    Personal Factors and Comorbidities Comorbidity 3+;Past/Current Experience;Time since onset of injury/illness/exacerbation;Transportation;Fitness;Age    Comorbidities CHF, ESRD, HTN    Examination-Activity Limitations Bathing;Bed Mobility;Caring for Others;Carry;Dressing;Hygiene/Grooming;Lift;Locomotion Level;Self Feeding;Sit;Squat;Stairs;Stand;Toileting;Transfers    Examination-Participation Restrictions Cleaning;Community Activity;Driving;Interpersonal Relationship;Laundry;Medication Management;Meal Prep;Shop;Yard Work    Stability/Clinical Decision Making Evolving/Moderate complexity    Rehab Potential Good    PT Frequency 2x / week    PT Duration 8 weeks    PT Treatment/Interventions ADLs/Self Care Home Management;Gait training;Stair training;Functional mobility training;Therapeutic activities;Therapeutic exercise;Balance training;Neuromuscular re-education;Patient/family education;Manual techniques;Passive range of motion;Joint Manipulations    PT Next Visit Plan review and advance HEP; gait with cane on compliant surfaces  indoors, ? outdoors as well, continue with ramp/curbs with cane    PT [facility]- updated  on 04/12/20    Consulted and Agree with Plan of Care Patient           Patient will benefit from skilled therapeutic intervention in order to improve the following deficits and impairments:  Abnormal gait, Decreased activity tolerance, Decreased balance, Decreased mobility, Decreased endurance, Decreased coordination, Decreased range of motion, Decreased safety awareness, Decreased strength, Difficulty walking, Impaired flexibility, Postural dysfunction, Impaired UE functional use, Impaired tone  Visit Diagnosis: Muscle weakness (generalized)  Unsteadiness on feet     Problem List Patient Active Problem List   Diagnosis Date Noted  . 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 Gait abnormality 08/12/2019  . History of gout 06/17/2019  . Bowel incontinence 06/09/2019  . Anoxic brain injury ([facility])   . History of cardiac arrest 04/27/2019  . History of non-ST elevation myocardial infarction (NSTEMI) 04/18/2019  . Morbid obesity ([facility]) 04/14/2019  . Diabetic neuropathy ([facility]) 04/02/2016  . Hypertension associated with diabetes ([facility]) 10/03/2015  . HLD (hyperlipidemia) 09/06/2015  . End stage renal disease on dialysis ([facility]) 08/31/2015  . DM (diabetes mellitus), type 2 with renal complications ([facility]) 87/68/1157  . OSA treated with BiPAP 12/12/2013  . Coronary atherosclerosis 11/01/2009    Willow Ora, PTA, [facility] [address] [place], [place] [address] [phone] 04/12/20, 12:11 AM   Name: Adnan Vanvoorhis MRN: [id number] Date of Birth: 1966-03-26

## 2020-04-11 NOTE — Patient Instructions (Signed)
Access Code: [email or web address] URL: [email or web address] Date: 04/11/2020 Prepared by: Willow Ora  Exercises Side Stepping with Counter Support - 1 x daily - 7 x weekly - 2 sets Standing Hip Abduction with Resistance at Ankles and Counter Support - 1 x daily - 5 x weekly - 1 sets - 10 reps Standing Hip Extension with Resistance at Ankles and Counter Support - 1 x daily - 5 x weekly - 1 sets - 10 reps Standing Hip Flexion with Resistance Loop - 1 x daily - 7 x weekly - 2 sets - 10 reps Standing Single Leg Stance with Counter Support - 1 x daily - 5 x weekly - 1 sets - 3 reps - 10 hold Tandem Walking with Counter Support - 1 x daily - 5 x weekly - 1 sets - 3 reps Standing Foot Tap on Box (BKA) - 1 x daily - 5 x weekly - 1 sets - 10 reps

## 2020-04-12 DIAGNOSIS — N186 End stage renal disease: Secondary | ICD-10-CM | POA: Diagnosis not present

## 2020-04-12 DIAGNOSIS — E1121 Type 2 diabetes mellitus with diabetic nephropathy: Secondary | ICD-10-CM | POA: Diagnosis not present

## 2020-04-12 DIAGNOSIS — Z992 Dependence on renal dialysis: Secondary | ICD-10-CM | POA: Diagnosis not present

## 2020-04-12 DIAGNOSIS — D631 Anemia in chronic kidney disease: Secondary | ICD-10-CM | POA: Diagnosis not present

## 2020-04-12 DIAGNOSIS — N2581 Secondary hyperparathyroidism of renal origin: Secondary | ICD-10-CM | POA: Diagnosis not present

## 2020-04-13 ENCOUNTER — Encounter: Payer: Self-pay | Admitting: Physical Therapy

## 2020-04-13 ENCOUNTER — Other Ambulatory Visit: Payer: Self-pay

## 2020-04-13 ENCOUNTER — Ambulatory Visit: Payer: Medicare Other | Admitting: Physical Therapy

## 2020-04-13 DIAGNOSIS — M6281 Muscle weakness (generalized): Secondary | ICD-10-CM

## 2020-04-13 DIAGNOSIS — R2681 Unsteadiness on feet: Secondary | ICD-10-CM

## 2020-04-14 DIAGNOSIS — E1121 Type 2 diabetes mellitus with diabetic nephropathy: Secondary | ICD-10-CM | POA: Diagnosis not present

## 2020-04-14 DIAGNOSIS — N2581 Secondary hyperparathyroidism of renal origin: Secondary | ICD-10-CM | POA: Diagnosis not present

## 2020-04-14 DIAGNOSIS — Z992 Dependence on renal dialysis: Secondary | ICD-10-CM | POA: Diagnosis not present

## 2020-04-14 DIAGNOSIS — N186 End stage renal disease: Secondary | ICD-10-CM | POA: Diagnosis not present

## 2020-04-14 DIAGNOSIS — D631 Anemia in chronic kidney disease: Secondary | ICD-10-CM | POA: Diagnosis not present

## 2020-04-14 NOTE — Therapy (Signed)
[facility] [address] [place], [place], [address] Phone: [phone]   Fax:  [phone]  Physical Therapy Treatment  Patient Details  Name: Troy Wells MRN: [id number] Date of Birth: February 12, 1966 Referring Provider (PT): Dr. Andrena Mews   Encounter Date: 04/13/2020   PT End of Session - 04/13/20 0808    Visit Number 29    Number of Visits 32    Date for PT Re-Evaluation 04/25/20    Authorization Type Medicare, PN 9/32/67, Recert 07/10/56    Progress Note Due on Visit 30    PT Start Time 0803    PT Stop Time 0845    PT Time Calculation (min) 42 min    Equipment Utilized During Treatment Gait belt    Activity Tolerance Patient tolerated treatment well;No increased pain    Behavior During Therapy WFL for tasks assessed/performed           Past Medical History:  Diagnosis Date  . CARDIAC ARREST 11/01/2009   Qualifier: Diagnosis of  By: Selena Batten CMA, Jewel    . Coagulase negative Staphylococcus bacteremia 06/30/2019  . Colon polyps 01/02/2016   Colonoscopy July 2017 - One 3 mm polyp in the transverse colon, removed with a cold biopsy forceps. Resected and retrieved. - One 3 mm polyp in the rectum, removed with a cold biopsy forceps. Resected and retrieved. - Diverticulosis in the entire examined colon. - Non-bleeding internal hemorrhoids. - The examination was otherwise normal. - Significant looping which prolonged cecal    . Coronary artery disease 08/15/2009   with AMI  . Coronary atherosclerosis 11/01/2009   Qualifier: Diagnosis of  By: Selena Batten CMA, Jewel    . Dialysis patient ([facility]) 09/27/2014   mon, wed, and fri  . Diverticulosis of colon without hemorrhage 01/02/2016   Colonoscopy July 2017 - One 3 mm polyp in the transverse colon, removed with a cold biopsy forceps. Resected and retrieved. - One 3 mm polyp in the rectum, removed with a cold biopsy forceps. Resected and retrieved. -  Diverticulosis in the entire examined colon. - Non-bleeding internal hemorrhoids. - The examination was otherwise normal. - Significant looping which prolonged cecal    . DM (diabetes mellitus), type 2 with renal complications ([facility]) 09/98/3382  . Essential hypertension 11/01/2009   Qualifier: Diagnosis of  By: Selena Batten CMA, Jewel    . Gout 10/21/2018  . History of acute respiratory distress syndrome (ARDS) due to COVID-19 virus   . History of cardiac arrest 04/27/2019  . History of non-ST elevation myocardial infarction (NSTEMI) 04/18/2019  . history of respiratory arrest   . Hyperlipidemia   . Hypertension   . Mitral valve regurgitation 09/19/2015   Echo 09/2015   . 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 OSA treated with BiPAP 06/17/2010  . Protein-calorie malnutrition ([facility]) 06/01/2019   Poor PO intake, only eating 50% of meals, could benefit from feeding tube if persistently poor PO intake.  . Pulmonary hypertension ([facility]) 04/14/2019   RVSP 38 (TTE 2017) but repeat TTE March 2019 with RVSP 25 RAP in 2020: 81mhg  . Sleep apnea    cpap nightly    Past Surgical History:  Procedure Laterality Date  . AV FISTULA PLACEMENT Left 09/27/2014  . INSERTION OF ARTERIOVENOUS (AV) ARTEGRAFT ARM Left 04/02/2018   Procedure: INSERTION OF ARTERIOVENOUS (AV) ARTEGRAFT ARM LEFT UPPER ARM;  Surgeon: CWaynetta Sandy MD;  [facility]  Service: Vascular;  Laterality: Left;  . INSERTION OF DIALYSIS CATHETER N/A 04/02/2018  Procedure: INSERTION OF DIALYSIS CATHETER, right internal jugular;  Surgeon: Waynetta Sandy, MD;  Location: [facility];  Service: Vascular;  Laterality: N/A;  . NECK SURGERY     C6 & C7 replaced 30 yrs ago per pt  . REVISON OF ARTERIOVENOUS FISTULA Left 04/02/2018   Procedure: REVISION PLICATION OF ARTERIOVENOUS FISTULA ARM;  Surgeon: Waynetta Sandy, MD;  Location: [facility];  Service: Vascular;  Laterality: Left;  . WISDOM TOOTH EXTRACTION      There were no vitals  filed for this visit.   Subjective Assessment - 04/13/20 0807    Subjective No new complaints. No falls or pain to report. New ex's are going well.    Pertinent History CHF, HTN, ERSD    Limitations Standing;Walking;Writing;House hold activities;Lifting    How long can you sit comfortably? supported sitting no problem    How long can you stand comfortably? <5 min    How long can you walk comfortably? 20 feet with walker    Patient Stated Goals improve walking get stronger    Currently in Pain? No/denies    Pain Score 0-No pain                 [facility] Adult PT Treatment/Exercise - 04/13/20 0809      Transfers   Transfers Sit to Stand;Stand to Sit    Sit to Stand 5: Supervision;With upper extremity assist;From chair/3-in-1;From bed    Stand to Sit 5: Supervision;With upper extremity assist;To chair/3-in-1;To bed      Ambulation/Gait   Ambulation/Gait Yes    Ambulation/Gait Assistance 4: Min guard    Ambulation/Gait Assistance Details cues to look forward, for more upright posture and sequencing with gait    Ambulation Distance (Feet) 350 Feet   x1, plus around gym with session   Assistive device Straight cane   rubber quad tip   Gait Pattern Decreased stride length;Decreased trunk rotation;Trunk flexed;Wide base of support;Poor foot clearance - left;Poor foot clearance - right    Ambulation Surface Level;Indoor      High Level Balance   High Level Balance Activities Figure 8 turns;Negotiating over obstacles    High Level Balance Comments with cane: figure 8's around hoola hoops for 3 laps with min guard assit, cues on step length/sequencing; 3 bolsters next to counter top- forwrard stepping over with cane/counter support for 4 laps with min guard assist, increased time needed to initiated stepping with bil LE's each time.              [facility]) - 04/13/20 2248    PWR! Up 10    PWR! Rock 10    PWR! Twist 10    PWR Step 10    Comments intermittent UE support needed for  balance. Pt able to coordinate UE/LE movement together today where he has been uable to with prior perforances.                  PT Short Term Goals - 03/28/20 0807      PT SHORT TERM GOAL #1   Title Pt will be able to ambulate 500 feet with quad cane with SBA to improve short term ambulation (all STG to be met by 03/29/20)    Baseline 03/28/2020: met in session today with min guard assist with cane with rubber quad tip    Time --    Period --    Status Achieved    Target Date 03/29/20      PT SHORT  TERM GOAL #2   Title Patient will be able to come down steps facing forward to improve ability to negotiate 12 steps with bil rails    Baseline 03/28/20: met in session today with bil rails, min guard assist.    Time --    Period --    Status Achieved    Target Date 03/29/20             PT Long Term Goals - 02/29/20 1311      PT LONG TERM GOAL #1   Title Patient will be able to ambulate >1000 feet with quad cane to improve community ambulation    Baseline 20 feet with RW, 115' with quad cane (02/29/20)    Time 8    Period Weeks    Status Revised    Target Date 04/25/20      PT LONG TERM GOAL #2   Title Patient will be able to perform TUG <25 seconds with RW to improve overall functional mobility    Baseline 36 seconds with RW    Time 8    Period Weeks    Status Revised    Target Date 04/25/20      PT LONG TERM GOAL #3   Title Patient will be able to go up and down 17 steps with use of one rail with sBA to improve ability to go up and down steps    Baseline min A going up and scoots down on his butt; 12 steps with bil hand rail (coming down backward)    Time 8    Period Weeks    Status On-going    Target Date 04/25/20      PT LONG TERM GOAL #4   Title Patient will demo improvement on Berg balance scale to at least 43/56 to improve functional balance and reduce fall risk    Baseline 30/56; 37/56 02/10/2020; 42/56 (02/29/20)    Time 8    Period Weeks    Status  Revised    Target Date 04/25/20                 Plan - 04/13/20 0808    Clinical Impression Statement Today's skilled session continued to focus on gait with cane, balance with cane and use of PWR! moves in standing to address initiation and hesitancey with mobility. Pt able to move UE and LE together this session vs having to move separately in previous sessions. The pt is progressing and should benefit from continued PT to progress toward unmet goals.    Personal Factors and Comorbidities Comorbidity 3+;Past/Current Experience;Time since onset of injury/illness/exacerbation;Transportation;Fitness;Age    Comorbidities CHF, ESRD, HTN    Examination-Activity Limitations Bathing;Bed Mobility;Caring for Others;Carry;Dressing;Hygiene/Grooming;Lift;Locomotion Level;Self Feeding;Sit;Squat;Stairs;Stand;Toileting;Transfers    Examination-Participation Restrictions Cleaning;Community Activity;Driving;Interpersonal Relationship;Laundry;Medication Management;Meal Prep;Shop;Yard Work    Stability/Clinical Decision Making Evolving/Moderate complexity    Rehab Potential Good    PT Frequency 2x / week    PT Duration 8 weeks    PT Treatment/Interventions ADLs/Self Care Home Management;Gait training;Stair training;Functional mobility training;Therapeutic activities;Therapeutic exercise;Balance training;Neuromuscular re-education;Patient/family education;Manual techniques;Passive range of motion;Joint Manipulations    PT Next Visit Plan progress note due. gait with cane on compliant surfaces indoors, ? outdoors as well, continue with ramp/curbs with cane    PT [facility]- updated on 04/12/20    Consulted and Agree with Plan of Care Patient           Patient will benefit from skilled therapeutic intervention in order to improve  the following deficits and impairments:  Abnormal gait, Decreased activity tolerance, Decreased balance, Decreased mobility, Decreased endurance,  Decreased coordination, Decreased range of motion, Decreased safety awareness, Decreased strength, Difficulty walking, Impaired flexibility, Postural dysfunction, Impaired UE functional use, Impaired tone  Visit Diagnosis: Muscle weakness (generalized)  Unsteadiness on feet     Problem List Patient Active Problem List   Diagnosis Date Noted  . 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 Gait abnormality 08/12/2019  . History of gout 06/17/2019  . Bowel incontinence 06/09/2019  . Anoxic brain injury ([facility])   . History of cardiac arrest 04/27/2019  . History of non-ST elevation myocardial infarction (NSTEMI) 04/18/2019  . Morbid obesity ([facility]) 04/14/2019  . Diabetic neuropathy ([facility]) 04/02/2016  . Hypertension associated with diabetes ([facility]) 10/03/2015  . HLD (hyperlipidemia) 09/06/2015  . End stage renal disease on dialysis ([facility]) 08/31/2015  . DM (diabetes mellitus), type 2 with renal complications ([facility]) 25/95/6387  . OSA treated with BiPAP 12/12/2013  . Coronary atherosclerosis 11/01/2009    Willow Ora, PTA, [facility] [address], [place], [place] [address] [phone] 04/14/20, 7:56 AM   Name: Manu Rubey MRN: [id number] Date of Birth: February 03, 1966

## 2020-04-17 DIAGNOSIS — N2581 Secondary hyperparathyroidism of renal origin: Secondary | ICD-10-CM | POA: Diagnosis not present

## 2020-04-17 DIAGNOSIS — E1121 Type 2 diabetes mellitus with diabetic nephropathy: Secondary | ICD-10-CM | POA: Diagnosis not present

## 2020-04-17 DIAGNOSIS — Z992 Dependence on renal dialysis: Secondary | ICD-10-CM | POA: Diagnosis not present

## 2020-04-17 DIAGNOSIS — N186 End stage renal disease: Secondary | ICD-10-CM | POA: Diagnosis not present

## 2020-04-17 DIAGNOSIS — R2689 Other abnormalities of gait and mobility: Secondary | ICD-10-CM | POA: Diagnosis not present

## 2020-04-17 DIAGNOSIS — R2681 Unsteadiness on feet: Secondary | ICD-10-CM | POA: Diagnosis not present

## 2020-04-17 DIAGNOSIS — M6281 Muscle weakness (generalized): Secondary | ICD-10-CM | POA: Diagnosis not present

## 2020-04-17 DIAGNOSIS — D631 Anemia in chronic kidney disease: Secondary | ICD-10-CM | POA: Diagnosis not present

## 2020-04-17 DIAGNOSIS — E1122 Type 2 diabetes mellitus with diabetic chronic kidney disease: Secondary | ICD-10-CM | POA: Diagnosis not present

## 2020-04-18 ENCOUNTER — Ambulatory Visit: Payer: Medicare Other | Attending: Family Medicine

## 2020-04-18 ENCOUNTER — Other Ambulatory Visit: Payer: Self-pay

## 2020-04-18 DIAGNOSIS — M6281 Muscle weakness (generalized): Secondary | ICD-10-CM | POA: Diagnosis not present

## 2020-04-18 DIAGNOSIS — R2681 Unsteadiness on feet: Secondary | ICD-10-CM | POA: Insufficient documentation

## 2020-04-18 DIAGNOSIS — R2689 Other abnormalities of gait and mobility: Secondary | ICD-10-CM | POA: Diagnosis not present

## 2020-04-18 NOTE — Progress Notes (Signed)
Patient Details  Name: Troy Wells MRN: [id number] Date of Birth: 30-May-1966 Referring Provider (PT): Dr. Andrena Mews     DM2:Off meds. Does not check his CBGs at home. However, he checks at dialysis and it has been good.   ESRD on HD: M-W-F   CAD: self d/ced his ASA. No cardiovascular concerns.   HLD: He is compliant with his lipitor 80 mg qd. No concerns.

## 2020-04-18 NOTE — Therapy (Signed)
[facility] [address] [place], [place], [address] Phone: [phone]   Fax:  [phone]  Physical Therapy Treatment  Patient Details  Name: Troy Wells MRN: [id number] Date of Birth: [birth year] Referring Provider (PT): Dr. Andrena Mews   Encounter Date: 04/18/2020   PT End of Session - 04/18/20 1651    Visit Number 30    Number of Visits 32    Date for PT Re-Evaluation 04/25/20    Authorization Type Medicare, PN 7/34/28, Recert 7/68/11    Progress Note Due on Visit 30    PT Start Time 1615    PT Stop Time 1700    PT Time Calculation (min) 45 min    Equipment Utilized During Treatment Gait belt    Activity Tolerance Patient tolerated treatment well;No increased pain    Behavior During Therapy WFL for tasks assessed/performed           Past Medical History:  Diagnosis Date  . CARDIAC ARREST 11/01/2009   Qualifier: Diagnosis of  By: Selena Batten CMA, Jewel    . Coagulase negative Staphylococcus bacteremia 06/30/2019  . Colon polyps 01/02/2016   Colonoscopy July 2017 - One 3 mm polyp in the transverse colon, removed with a cold biopsy forceps. Resected and retrieved. - One 3 mm polyp in the rectum, removed with a cold biopsy forceps. Resected and retrieved. - Diverticulosis in the entire examined colon. - Non-bleeding internal hemorrhoids. - The examination was otherwise normal. - Significant looping which prolonged cecal    . Coronary artery disease 08/15/2009   with AMI  . Coronary atherosclerosis 11/01/2009   Qualifier: Diagnosis of  By: Selena Batten CMA, Jewel    . Dialysis patient ([place]) 09/27/2014   mon, wed, and fri  . Diverticulosis of colon without hemorrhage 01/02/2016   Colonoscopy July 2017 - One 3 mm polyp in the transverse colon, removed with a cold biopsy forceps. Resected and retrieved. - One 3 mm polyp in the rectum, removed with a cold biopsy forceps. Resected and retrieved. -  Diverticulosis in the entire examined colon. - Non-bleeding internal hemorrhoids. - The examination was otherwise normal. - Significant looping which prolonged cecal    . DM (diabetes mellitus), type 2 with renal complications ([place]) 57/26/2035  . Essential hypertension 11/01/2009   Qualifier: Diagnosis of  By: Selena Batten CMA, Jewel    . Gout 10/21/2018  . History of acute respiratory distress syndrome (ARDS) due to COVID-19 virus   . History of cardiac arrest 04/27/2019  . History of non-ST elevation myocardial infarction (NSTEMI) 04/18/2019  . history of respiratory arrest   . Hyperlipidemia   . Hypertension   . Mitral valve regurgitation 09/19/2015   Echo 09/2015   . 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 OSA treated with BiPAP 06/17/2010  . Protein-calorie malnutrition ([facility]) 06/01/2019   Poor PO intake, only eating 50% of meals, could benefit from feeding tube if persistently poor PO intake.  . Pulmonary hypertension ([place]) 04/14/2019   RVSP 38 (TTE 2017) but repeat TTE March 2019 with RVSP 25 RAP in 2020: 22mhg  . Sleep apnea    cpap nightly    Past Surgical History:  Procedure Laterality Date  . AV FISTULA PLACEMENT Left 09/27/2014  . INSERTION OF ARTERIOVENOUS (AV) ARTEGRAFT ARM Left 04/02/2018   Procedure: INSERTION OF ARTERIOVENOUS (AV) ARTEGRAFT ARM LEFT UPPER ARM;  Surgeon: CWaynetta Sandy MD;  Location: [facility]  Service: Vascular;  Laterality: Left;  . INSERTION OF DIALYSIS CATHETER N/A 04/02/2018  Procedure: INSERTION OF DIALYSIS CATHETER, right internal jugular;  Surgeon: Waynetta Sandy, MD;  Location: [facility];  Service: Vascular;  Laterality: N/A;  . NECK SURGERY     C6 & C7 replaced 30 yrs ago per pt  . REVISON OF ARTERIOVENOUS FISTULA Left 04/02/2018   Procedure: REVISION PLICATION OF ARTERIOVENOUS FISTULA ARM;  Surgeon: Waynetta Sandy, MD;  Location: [facility];  Service: Vascular;  Laterality: Left;  . WISDOM TOOTH EXTRACTION      There were no vitals  filed for this visit.   Subjective Assessment - 04/18/20 1635    Subjective No new complaints    Pertinent History CHF, HTN, ERSD    Limitations Standing;Walking;Writing;House hold activities;Lifting    How long can you sit comfortably? supported sitting no problem    How long can you stand comfortably? <5 min    How long can you walk comfortably? 20 feet with walker    Patient Stated Goals improve walking get stronger              [facility] PT Assessment - 04/18/20 1658      Timed Up and Go Test   Normal TUG (seconds) 22   straight cane             Gait training: 1 x 1035' wth st cane, 4 standing rest breaks for 10-15 seconds (SBA) Fwd step ups: bottom step of stairs: 10x R and L bil HHA Box taps: 2nd step of the stairs with bil HHA: pt able to perform 10x with R LE but only able to perform 2 reps with L after multiple tries, we tried randomly saying states names "massechusets" while lifting leg up which helped for 2 reps but not consistently as patient was hesitant at saying it loudly to override "freezing" sensation TUG performed with st. Cane: 22 seconds                      PT Short Term Goals - 03/28/20 0807      PT SHORT TERM GOAL #1   Title Pt will be able to ambulate 500 feet with quad cane with SBA to improve short term ambulation (all STG to be met by 03/29/20)    Baseline 03/28/2020: met in session today with min guard assist with cane with rubber quad tip    Time --    Period --    Status Achieved    Target Date 03/29/20      PT SHORT TERM GOAL #2   Title Patient will be able to come down steps facing forward to improve ability to negotiate 12 steps with bil rails    Baseline 03/28/20: met in session today with bil rails, min guard assist.    Time --    Period --    Status Achieved    Target Date 03/29/20             PT Long Term Goals - 04/18/20 1637      PT LONG TERM GOAL #1   Title Patient will be able to ambulate >1000 feet  with quad cane to improve community ambulation    Baseline 20 feet with RW, 115' with quad cane (02/29/20), 1035' with st. cane and SBA    Time 8    Period Weeks    Status Achieved      PT LONG TERM GOAL #2   Title Patient will be able to perform TUG <25 seconds with RW to improve  overall functional mobility    Baseline 36 seconds with RW, 22 seconds with straight cane (04/18/20)    Time 8    Period Weeks    Status Achieved      PT LONG TERM GOAL #3   Title Patient will be able to go up and down 17 steps with use of one rail with sBA to improve ability to go up and down steps    Baseline min A going up and scoots down on his butt; 12 steps with bil hand rail (coming down backward)    Time 8    Period Weeks    Status On-going      PT LONG TERM GOAL #4   Title Patient will demo improvement on Berg balance scale to at least 43/56 to improve functional balance and reduce fall risk    Baseline 30/56; 37/56 02/10/2020; 42/56 (02/29/20)    Time 8    Period Weeks    Status Revised                 Plan - 04/18/20 1636    Clinical Impression Statement Today's skilled session was focused on improving gait distance and cardiovascual endurance. patient was able to ambulate 1035' with st. cane and SBA and required 3-4 standing breaks for abou 10-15 seconds. Pt met LTG # 1 and 2 met today.    Personal Factors and Comorbidities Comorbidity 3+;Past/Current Experience;Time since onset of injury/illness/exacerbation;Transportation;Fitness;Age    Comorbidities CHF, ESRD, HTN    Examination-Activity Limitations Bathing;Bed Mobility;Caring for Others;Carry;Dressing;Hygiene/Grooming;Lift;Locomotion Level;Self Feeding;Sit;Squat;Stairs;Stand;Toileting;Transfers    Examination-Participation Restrictions Cleaning;Community Activity;Driving;Interpersonal Relationship;Laundry;Medication Management;Meal Prep;Shop;Yard Work    Stability/Clinical Decision Making Evolving/Moderate complexity    Rehab Potential  Good    PT Frequency 2x / week    PT Duration 8 weeks    PT Treatment/Interventions ADLs/Self Care Home Management;Gait training;Stair training;Functional mobility training;Therapeutic activities;Therapeutic exercise;Balance training;Neuromuscular re-education;Patient/family education;Manual techniques;Passive range of motion;Joint Manipulations    PT Next Visit Plan progress note due. gait with cane on compliant surfaces indoors, ? outdoors as well, continue with ramp/curbs with cane    PT [place]- updated on 04/12/20    Consulted and Agree with Plan of Care Patient           Patient will benefit from skilled therapeutic intervention in order to improve the following deficits and impairments:  Abnormal gait, Decreased activity tolerance, Decreased balance, Decreased mobility, Decreased endurance, Decreased coordination, Decreased range of motion, Decreased safety awareness, Decreased strength, Difficulty walking, Impaired flexibility, Postural dysfunction, Impaired UE functional use, Impaired tone  Visit Diagnosis: Unsteadiness on feet  Muscle weakness (generalized)     Problem List Patient Active Problem List   Diagnosis Date Noted  . 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 Gait abnormality 08/12/2019  . History of gout 06/17/2019  . Bowel incontinence 06/09/2019  . Anoxic brain injury ([place])   . History of cardiac arrest 04/27/2019  . History of non-ST elevation myocardial infarction (NSTEMI) 04/18/2019  . Morbid obesity ([facility]) 04/14/2019  . Diabetic neuropathy ([place]) 04/02/2016  . Hypertension associated with diabetes ([place]) 10/03/2015  . HLD (hyperlipidemia) 09/06/2015  . End stage renal disease on dialysis ([facility]) 08/31/2015  . DM (diabetes mellitus), type 2 with renal complications ([place]) 12/15/1599  . OSA treated with BiPAP 12/12/2013  . Coronary atherosclerosis 11/01/2009    Kerrie Pleasure, PT 04/18/2020, [facility] [address] [place], [place], [address] Phone: [phone]   Fax:  [phone]  Name: Troy Wells MRN: [id number] Date of Birth: 1966-04-16

## 2020-04-19 DIAGNOSIS — E1121 Type 2 diabetes mellitus with diabetic nephropathy: Secondary | ICD-10-CM | POA: Diagnosis not present

## 2020-04-19 DIAGNOSIS — N2581 Secondary hyperparathyroidism of renal origin: Secondary | ICD-10-CM | POA: Diagnosis not present

## 2020-04-19 DIAGNOSIS — Z992 Dependence on renal dialysis: Secondary | ICD-10-CM | POA: Diagnosis not present

## 2020-04-19 DIAGNOSIS — D631 Anemia in chronic kidney disease: Secondary | ICD-10-CM | POA: Diagnosis not present

## 2020-04-19 DIAGNOSIS — N186 End stage renal disease: Secondary | ICD-10-CM | POA: Diagnosis not present

## 2020-04-20 ENCOUNTER — Encounter: Payer: Self-pay | Admitting: Physical Therapy

## 2020-04-20 ENCOUNTER — Ambulatory Visit: Payer: Medicare Other | Admitting: Physical Therapy

## 2020-04-20 ENCOUNTER — Other Ambulatory Visit: Payer: Self-pay

## 2020-04-20 VITALS — BP 125/73

## 2020-04-20 DIAGNOSIS — M6281 Muscle weakness (generalized): Secondary | ICD-10-CM

## 2020-04-20 DIAGNOSIS — R2681 Unsteadiness on feet: Secondary | ICD-10-CM | POA: Diagnosis not present

## 2020-04-20 DIAGNOSIS — R2689 Other abnormalities of gait and mobility: Secondary | ICD-10-CM | POA: Diagnosis not present

## 2020-04-20 IMAGING — DX DG CHEST 1V PORT
1 series · 1 of 1 positions shown · non-contrast
Comparison: April 28, 2019.

CLINICAL DATA: SPMCG-J2 positive.  Endotracheal tube placement.

EXAM:
PORTABLE CHEST 1 VIEW

[chest ap]
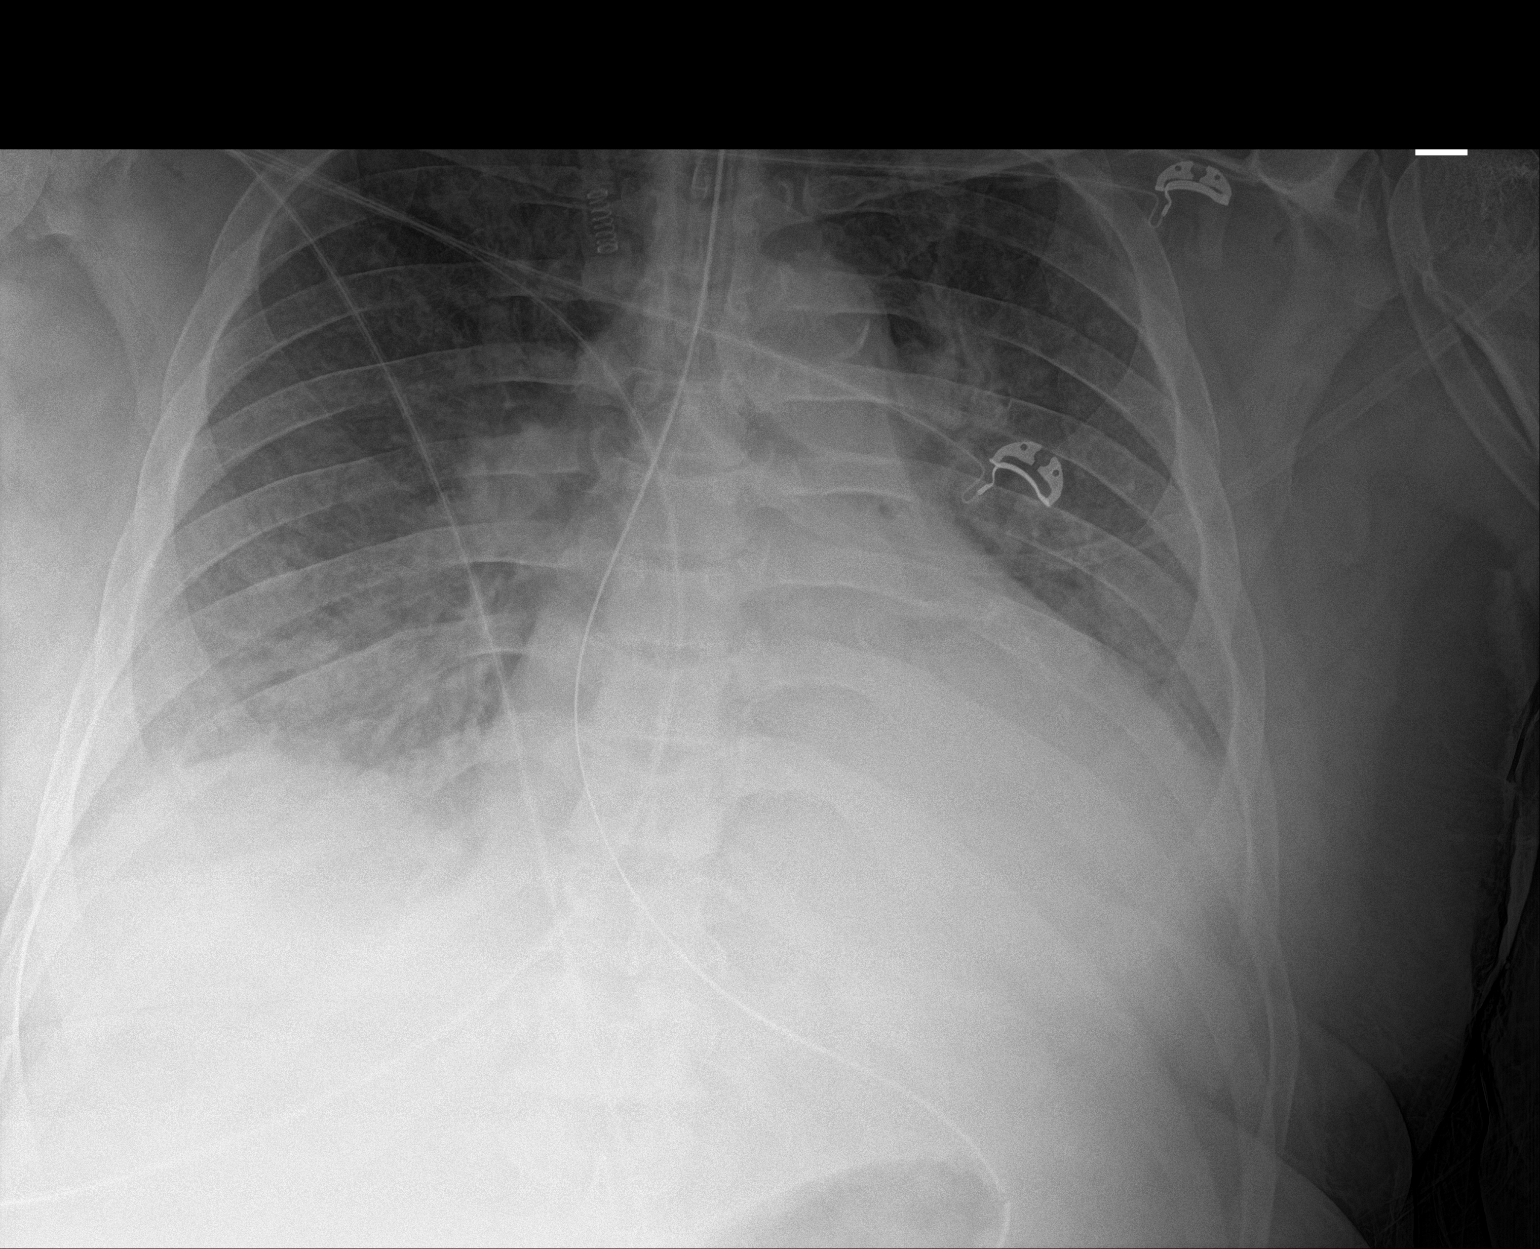

[1 of 1 positions shown; findings below may reference images not displayed]

FINDINGS: Stable cardiomegaly. Stable position of endotracheal and nasogastric
tubes. Stable bilateral lung opacities are noted concerning for
edema or infiltrates. No significant pleural effusion is noted. Bony
thorax is unremarkable.
IMPRESSION: Stable support apparatus.  Stable bibasilar opacities.

## 2020-04-20 NOTE — Therapy (Signed)
[facility] [address] [place], [place], [address] Phone: [phone]   Fax:  [phone]  Physical Therapy Treatment  Patient Details  Name: Troy Wells MRN: [id number] Date of Birth: 12/13/1965 Referring Provider (PT): Dr. Andrena Mews   Encounter Date: 04/20/2020   PT End of Session - 04/20/20 1829    Visit Number 31    Number of Visits 32    Date for PT Re-Evaluation 04/25/20    Authorization Type Medicare parts A&B; KX modifier required now    Progress Note Due on Visit 67    PT Start Time 0803    PT Stop Time 0850    PT Time Calculation (min) 47 min    Equipment Utilized During Treatment Gait belt    Activity Tolerance Other (comment)   limited by fatigue and tremors   Behavior During Therapy [facility] for tasks assessed/performed           Past Medical History:  Diagnosis Date   CARDIAC ARREST 11/01/2009   Qualifier: Diagnosis of  By: [place] Batten CMA, Jewel     Coagulase negative Staphylococcus bacteremia 06/30/2019   Colon polyps 01/02/2016   Colonoscopy July 2017 - One 3 mm polyp in the transverse colon, removed with a cold biopsy forceps. Resected and retrieved. - One 3 mm polyp in the rectum, removed with a cold biopsy forceps. Resected and retrieved. - Diverticulosis in the entire examined colon. - Non-bleeding internal hemorrhoids. - The examination was otherwise normal. - Significant looping which prolonged cecal     Coronary artery disease 08/15/2009   with AMI   Coronary atherosclerosis 11/01/2009   Qualifier: Diagnosis of  By: Selena Batten CMA, Jewel     Dialysis patient [facility]) 09/27/2014   mon, wed, and fri   Diverticulosis of colon without hemorrhage 01/02/2016   Colonoscopy July 2017 - One 3 mm polyp in the transverse colon, removed with a cold biopsy forceps. Resected and retrieved. - One 3 mm polyp in the rectum, removed with a cold biopsy forceps. Resected and retrieved. -  Diverticulosis in the entire examined colon. - Non-bleeding internal hemorrhoids. - The examination was otherwise normal. 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   Pulmonary hypertension ([place]) 04/14/2019   RVSP 38 (TTE 2017) but repeat TTE March 2019 with RVSP 25 RAP in 2020: 63mhg   Sleep apnea    cpap nightly    Past Surgical History:  Procedure Laterality Date   AV FISTULA PLACEMENT Left 09/27/2014   INSERTION OF ARTERIOVENOUS (AV) ARTEGRAFT ARM Left 04/02/2018   Procedure: INSERTION OF ARTERIOVENOUS (AV) ARTEGRAFT ARM LEFT UPPER ARM;  Surgeon: CWaynetta Sandy MD;  Location: [facility]  Service: Vascular;  Laterality: Left;   INSERTION OF DIALYSIS CATHETER  N/A 04/02/2018   Procedure: INSERTION OF DIALYSIS CATHETER, right internal jugular;  Surgeon: Waynetta Sandy, MD;  Location: [facility];  Service: Vascular;  Laterality: N/A;   NECK SURGERY     C6 & C7 replaced 30 yrs ago per pt   REVISON OF ARTERIOVENOUS FISTULA Left 04/02/2018   Procedure: REVISION PLICATION OF ARTERIOVENOUS FISTULA ARM;  Surgeon: Waynetta Sandy, MD;  Location: [facility];  Service: Vascular;  Laterality: Left;   WISDOM TOOTH EXTRACTION      Vitals:   04/20/20  0811  BP: 125/73     Subjective Assessment - 04/20/20 0807    Subjective Nothing new to report.  Has made significant progress.    Pertinent History CHF, HTN, ERSD    Limitations Standing;Walking;Writing;House hold activities;Lifting    How long can you sit comfortably? supported sitting no problem    How long can you stand comfortably? <5 min    How long can you walk comfortably? 20 feet with walker    Patient Stated Goals improve walking get stronger    Currently in Pain? No/denies                             [facility] Adult PT Treatment/Exercise - 04/20/20 0836      Transfers   Transfers Sit to Stand;Stand to Sit    Sit to Stand 6: Modified independent (Device/Increase time)    Stand to Sit 6: Modified independent (Device/Increase time)    Stand Pivot Transfers 6: Modified independent (Device/Increase time)    Stand Pivot Transfer Details (indicate cue type and reason) with UE support      Ambulation/Gait   Ambulation/Gait Yes    Ambulation/Gait Assistance 4: Min assist    Ambulation/Gait Assistance Details Ambulated outside over paved and grassy surfaces with cane; for first 500' pt ambulated with min guard and good reciprocal, step through gait without tremors.  After 500' pt began to fatigue and experience increased tremors and required increased standing breaks.  Recommended that pt re-enter through front of building and not continue around building.  Pt wished to continue.  When ambulating through grass pt demonstrated increased step to gait sequence, increased standing rest breaks and UE support from therapist.  Pt requested to stop and lean against wall.  Once pt back on sidewalk he was able to ambulate the rest of the way with UE support and cane.  Required increased rest break after walking outside.  Pt alerted PT that he typically uses RW when he walks to mailbox at home; will attempt again next week with RW instead of cane    Ambulation Distance (Feet) 800  Feet    Assistive device Straight cane    Gait Pattern Step-to pattern;Step-through pattern;Decreased step length - right;Decreased step length - left;Decreased stride length;Poor foot clearance - left;Poor foot clearance - right   tremors   Ambulation Surface Unlevel;Outdoor;Paved;Grass      Knee/Hip Exercises: Aerobic   Nustep Level 6 resistance with bilat UE and LE for aerobic endurance x 8 minutes                  PT Education - 04/20/20 [place]    Education Details plan to check goals next week and D/C    Person(s) Educated Patient    Methods Explanation    Comprehension Verbalized understanding            PT Short Term Goals - 03/28/20 1245  PT SHORT TERM GOAL #1   Title Pt will be able to ambulate 500 feet with quad cane with SBA to improve short term ambulation (all STG to be met by 03/29/20)    Baseline 03/28/2020: met in session today with min guard assist with cane with rubber quad tip    Time --    Period --    Status Achieved    Target Date 03/29/20      PT SHORT TERM GOAL #2   Title Patient will be able to come down steps facing forward to improve ability to negotiate 12 steps with bil rails    Baseline 03/28/20: met in session today with bil rails, min guard assist.    Time --    Period --    Status Achieved    Target Date 03/29/20             PT Long Term Goals - 04/20/20 1833      PT LONG TERM GOAL #1   Title Patient will be able to ambulate >1000 feet with quad cane to improve community ambulation    Time 8    Period Weeks    Status On-going      PT LONG TERM GOAL #2   Title Patient will be able to perform TUG <25 seconds with RW to improve overall functional mobility    Baseline 36 seconds with RW, 22 seconds with straight cane (04/18/20)    Time 8    Period Weeks    Status Achieved      PT LONG TERM GOAL #3   Title Patient will be able to go up and down 17 steps with use of one rail with sBA to improve ability to go up and down  steps    Baseline min A going up and scoots down on his butt; 12 steps with bil hand rail (coming down backward)    Time 8    Period Weeks    Status On-going      PT LONG TERM GOAL #4   Title Patient will demo improvement on Berg balance scale to at least 43/56 to improve functional balance and reduce fall risk    Baseline 30/56; 37/56 02/10/2020; 42/56 (02/29/20)    Time 8    Period Weeks    Status On-going                 Plan - 04/20/20 1831    Clinical Impression Statement Attempted to progress gait today with gait outside with cane; pt able to safely complete 500' outside over pavement but beyond 500' pt required HHA and multiple rest breaks to continue due to fatigue and tremors.  Will attempt again next week with RW.  Remainder of session focused on aerobic endurance and conditioning.  Plan to assess LTG and anticipate pt will be ready for D/C next week.    Personal Factors and Comorbidities Comorbidity 3+;Past/Current Experience;Time since onset of injury/illness/exacerbation;Transportation;Fitness;Age    Comorbidities CHF, ESRD, HTN    Examination-Activity Limitations Bathing;Bed Mobility;Caring for Others;Carry;Dressing;Hygiene/Grooming;Lift;Locomotion Level;Self Feeding;Sit;Squat;Stairs;Stand;Toileting;Transfers    Examination-Participation Restrictions Cleaning;Community Activity;Driving;Interpersonal Relationship;Laundry;Medication Management;Meal Prep;Shop;Yard Work    Stability/Clinical Decision Making Evolving/Moderate complexity    Rehab Potential Good    PT Frequency 2x / week    PT Duration 8 weeks    PT Treatment/Interventions ADLs/Self Care Home Management;Gait training;Stair training;Functional mobility training;Therapeutic activities;Therapeutic exercise;Balance training;Neuromuscular re-education;Patient/family education;Manual techniques;Passive range of motion;Joint Manipulations    PT Next Visit Plan Check LTG and work towards  D/C    PT Home Exercise Plan  [place]- updated on 04/12/20    Consulted and Agree with Plan of Care Patient           Patient will benefit from skilled therapeutic intervention in order to improve the following deficits and impairments:  Abnormal gait, Decreased activity tolerance, Decreased balance, Decreased mobility, Decreased endurance, Decreased coordination, Decreased range of motion, Decreased safety awareness, Decreased strength, Difficulty walking, Impaired flexibility, Postural dysfunction, Impaired UE functional use, Impaired tone  Visit Diagnosis: Unsteadiness on feet  Muscle weakness (generalized)     Problem List Patient Active Problem List   Diagnosis Date Noted   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   Gait abnormality 08/12/2019   History of gout 06/17/2019   Bowel incontinence 06/09/2019   Anoxic brain injury ([place])    History of cardiac arrest 04/27/2019   History of non-ST elevation myocardial infarction (NSTEMI) 04/18/2019   Morbid obesity ([place]) 04/14/2019   Diabetic neuropathy ([place]) 04/02/2016   Hypertension associated with diabetes ([place]) 10/03/2015   HLD (hyperlipidemia) 09/06/2015   End stage renal disease on dialysis ([place]) 08/31/2015   DM (diabetes mellitus), type 2 with renal complications ([place]) 73/57/8978   OSA treated with BiPAP 12/12/2013   Coronary atherosclerosis 11/01/2009    Rico Junker, PT, DPT 04/20/20    6:36 PM    [facility] [address] [place], [place], [address] Phone: [phone]   Fax:  [phone]  Name: Troy Wells MRN: [id number] Date of Birth: December 01, 1965

## 2020-04-21 DIAGNOSIS — E1121 Type 2 diabetes mellitus with diabetic nephropathy: Secondary | ICD-10-CM | POA: Diagnosis not present

## 2020-04-21 DIAGNOSIS — N2581 Secondary hyperparathyroidism of renal origin: Secondary | ICD-10-CM | POA: Diagnosis not present

## 2020-04-21 DIAGNOSIS — N186 End stage renal disease: Secondary | ICD-10-CM | POA: Diagnosis not present

## 2020-04-21 DIAGNOSIS — Z992 Dependence on renal dialysis: Secondary | ICD-10-CM | POA: Diagnosis not present

## 2020-04-21 DIAGNOSIS — D631 Anemia in chronic kidney disease: Secondary | ICD-10-CM | POA: Diagnosis not present

## 2020-04-24 DIAGNOSIS — N186 End stage renal disease: Secondary | ICD-10-CM | POA: Diagnosis not present

## 2020-04-24 DIAGNOSIS — N2581 Secondary hyperparathyroidism of renal origin: Secondary | ICD-10-CM | POA: Diagnosis not present

## 2020-04-24 DIAGNOSIS — Z992 Dependence on renal dialysis: Secondary | ICD-10-CM | POA: Diagnosis not present

## 2020-04-24 DIAGNOSIS — E1121 Type 2 diabetes mellitus with diabetic nephropathy: Secondary | ICD-10-CM | POA: Diagnosis not present

## 2020-04-24 DIAGNOSIS — D631 Anemia in chronic kidney disease: Secondary | ICD-10-CM | POA: Diagnosis not present

## 2020-04-25 ENCOUNTER — Ambulatory Visit: Payer: Medicare Other | Admitting: Physical Therapy

## 2020-04-25 ENCOUNTER — Other Ambulatory Visit: Payer: Self-pay

## 2020-04-25 DIAGNOSIS — M6281 Muscle weakness (generalized): Secondary | ICD-10-CM

## 2020-04-25 DIAGNOSIS — R2681 Unsteadiness on feet: Secondary | ICD-10-CM | POA: Diagnosis not present

## 2020-04-25 DIAGNOSIS — R2689 Other abnormalities of gait and mobility: Secondary | ICD-10-CM

## 2020-04-25 NOTE — Therapy (Signed)
[facility] [address] [place], [place], [address] Phone: [phone]   Fax:  [phone]  Physical Therapy Treatment  Patient Details  Name: Troy Wells MRN: [id number] Date of Birth: 08/21/1965 Referring Provider (PT): Dr. Andrena Mews   Encounter Date: 04/25/2020   PT End of Session - 04/25/20 0943    Visit Number 32    Number of Visits 32    Date for PT Re-Evaluation 04/25/20    Authorization Type Medicare parts A&B; KX modifier required now    Progress Note Due on Visit 32    PT Start Time 0810   arrived 10 min late   PT Stop Time 0846    PT Time Calculation (min) 36 min    Activity Tolerance Patient tolerated treatment well    Behavior During Therapy [facility] for tasks assessed/performed           Past Medical History:  Diagnosis Date  . CARDIAC ARREST 11/01/2009   Qualifier: Diagnosis of  By: Selena Batten CMA, [place]    . Coagulase negative Staphylococcus bacteremia 06/30/2019  . Colon polyps 01/02/2016   Colonoscopy July 2017 - One 3 mm polyp in the transverse colon, removed with a cold biopsy forceps. Resected and retrieved. - One 3 mm polyp in the rectum, removed with a cold biopsy forceps. Resected and retrieved. - Diverticulosis in the entire examined colon. - Non-bleeding internal hemorrhoids. - The examination was otherwise normal. - Significant looping which prolonged cecal    . Coronary artery disease 08/15/2009   with AMI  . Coronary atherosclerosis 11/01/2009   Qualifier: Diagnosis of  By: Selena Batten CMA, Jewel    . Dialysis patient ([place]) 09/27/2014   mon, wed, and fri  . Diverticulosis of colon without hemorrhage 01/02/2016   Colonoscopy July 2017 - One 3 mm polyp in the transverse colon, removed with a cold biopsy forceps. Resected and retrieved. - One 3 mm polyp in the rectum, removed with a cold biopsy forceps. Resected and retrieved. - Diverticulosis in the entire examined colon. -  Non-bleeding internal hemorrhoids. - The examination was otherwise normal. - Significant looping which prolonged cecal    . DM (diabetes mellitus), type 2 with renal complications ([place]) 89/37/3428  . Essential hypertension 11/01/2009   Qualifier: Diagnosis of  By: Selena Batten CMA, Jewel    . Gout 10/21/2018  . History of acute respiratory distress syndrome (ARDS) due to COVID-19 virus   . History of cardiac arrest 04/27/2019  . History of non-ST elevation myocardial infarction (NSTEMI) 04/18/2019  . history of respiratory arrest   . Hyperlipidemia   . Hypertension   . Mitral valve regurgitation 09/19/2015   Echo 09/2015   . 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 OSA treated with BiPAP 06/17/2010  . Protein-calorie malnutrition ([place]) 06/01/2019   Poor PO intake, only eating 50% of meals, could benefit from feeding tube if persistently poor PO intake.  . Pulmonary hypertension ([place]) 04/14/2019   RVSP 38 (TTE 2017) but repeat TTE March 2019 with RVSP 25 RAP in 2020: 74mhg  . Sleep apnea    cpap nightly    Past Surgical History:  Procedure Laterality Date  . AV FISTULA PLACEMENT Left 09/27/2014  . INSERTION OF ARTERIOVENOUS (AV) ARTEGRAFT ARM Left 04/02/2018   Procedure: INSERTION OF ARTERIOVENOUS (AV) ARTEGRAFT ARM LEFT UPPER ARM;  Surgeon: CWaynetta Sandy MD;  Location: [facility]  Service: Vascular;  Laterality: Left;  . INSERTION OF DIALYSIS CATHETER N/A 04/02/2018   Procedure: INSERTION OF DIALYSIS  CATHETER, right internal jugular;  Surgeon: Waynetta Sandy, MD;  Location: [facility];  Service: Vascular;  Laterality: N/A;  . NECK SURGERY     C6 & C7 replaced 30 yrs ago per pt  . REVISON OF ARTERIOVENOUS FISTULA Left 04/02/2018   Procedure: REVISION PLICATION OF ARTERIOVENOUS FISTULA ARM;  Surgeon: Waynetta Sandy, MD;  Location: [facility];  Service: Vascular;  Laterality: Left;  . WISDOM TOOTH EXTRACTION      There were no vitals filed for this visit.   Subjective Assessment -  04/25/20 0815    Subjective Transportation picked him up early and then rode all around [place], was about 10 minutes late due to transportation.  Has been sitting for a while.  Pt asking for more visits, feels he has a lot to work on.    Pertinent History CHF, HTN, ERSD    Limitations Standing;Walking;Writing;House hold activities;Lifting    How long can you sit comfortably? supported sitting no problem    How long can you stand comfortably? <5 min    How long can you walk comfortably? 20 feet with walker    Patient Stated Goals improve walking get stronger    Currently in Pain? No/denies              [facility] PT Assessment - 04/25/20 0815      Assessment   Medical Diagnosis Weakness    Referring Provider (PT) Dr. Andrena Mews    Onset Date/Surgical Date 12/21/19    Hand Dominance Right    Prior Therapy inpatient, subacute, home health      Precautions   Precautions [place] residence    Living Arrangements Children    Available Help at Discharge Family    Type of [place] to enter      Prior Function   Level of Independence Requires assistive device for independence;Needs assistance with ADLs;Needs assistance with homemaking;Needs assistance with gait;Needs assistance with transfers      Observation/Other Assessments   Focus on Therapeutic Outcomes (FOTO)  N/A      Ambulation/Gait   Stairs Yes    Stairs Assistance 4: Min assist    Stairs Assistance Details (indicate cue type and reason) when descending    Stair Management Technique Two rails;Step to pattern;Forwards    Number of Stairs 16    Height of Stairs 6      Berg Balance Test   Sit to Stand Able to stand without using hands and stabilize independently    Standing Unsupported Able to stand safely 2 minutes    Sitting with Back Unsupported but Feet Supported on Floor or Stool Able to sit safely and securely 2 minutes    Stand to Sit  Sits safely with minimal use of hands    Transfers Able to transfer safely, minor use of hands    Standing Unsupported with Eyes Closed Able to stand 10 seconds safely    Standing Unsupported with Feet Together Needs help to attain position but able to stand for 30 seconds with feet together    From Standing, Reach Forward with Outstretched Arm Can reach confidently >25 cm (10")    From Standing Position, Pick up Object from Floor Able to pick up shoe safely and easily    From Standing Position, Turn to Look Behind Over each Shoulder Looks behind from both sides and weight shifts well  Turn 360 Degrees Able to turn 360 degrees safely but slowly    Standing Unsupported, Alternately Place Feet on Step/Stool Needs assistance to keep from falling or unable to try    Standing Unsupported, One Foot in [place] to take small step independently and hold 30 seconds    Standing on One Leg Unable to try or needs assist to prevent fall    Total Score 41    Berg comment: no change since 4 weeks; 41/56 significant risk for falls                                 PT Education - 04/25/20 0942    Education Details progress towards goals, areas to continue to address, plan to add more visits    Person(s) Educated Patient    Methods Explanation    Comprehension Verbalized understanding            PT Short Term Goals - 03/28/20 0807      PT SHORT TERM GOAL #1   Title Pt will be able to ambulate 500 feet with quad cane with SBA to improve short term ambulation (all STG to be met by 03/29/20)    Baseline 03/28/2020: met in session today with min guard assist with cane with rubber quad tip    Time --    Period --    Status Achieved    Target Date 03/29/20      PT SHORT TERM GOAL #2   Title Patient will be able to come down steps facing forward to improve ability to negotiate 12 steps with bil rails    Baseline 03/28/20: met in session today with bil rails, min guard assist.      Time --    Period --    Status Achieved    Target Date 03/29/20             PT Long Term Goals - 04/25/20 0945      PT LONG TERM GOAL #1   Title Patient will be able to ambulate >1000 feet with quad cane to improve community ambulation    Baseline 800' with quad cane outside with frequent rest breaks and min-mod A when pt fatigues    Time 8    Period Weeks    Status Partially Met      PT LONG TERM GOAL #2   Title Patient will be able to perform TUG <25 seconds with RW to improve overall functional mobility    Baseline 36 seconds with RW, 22 seconds with straight cane (04/18/20)    Time 8    Period Weeks    Status Achieved      PT LONG TERM GOAL #3   Title Patient will be able to go up and down 17 steps with use of one rail with sBA to improve ability to go up and down steps    Baseline 16 stairs with bilat rails and min A    Time 8    Period Weeks    Status Partially Met      PT LONG TERM GOAL #4   Title Patient will demo improvement on Berg balance scale to at least 43/56 to improve functional balance and reduce fall risk    Baseline 41/56    Time 8    Period Weeks    Status Partially Met          New goals for  Recertification:  PT Short Term Goals - 04/25/20 1012      PT SHORT TERM GOAL #1   Title Pt will ambulate x 500' outside over pavement with quad cane and supervision    Time 4    Period Weeks    Status Revised    Target Date 05/25/20      PT SHORT TERM GOAL #2   Title Patient will be able to ascend and descend 8 stairs with step to sequence with one rail and cane with supervision    Time 4    Period Weeks    Status Revised    Target Date 05/25/20      PT SHORT TERM GOAL #3   Title Pt will increase BERG score to >/= 44/56    Baseline 41/56    Time 4    Period Weeks    Status New    Target Date 05/25/20      PT SHORT TERM GOAL #4   Title Pt will decrease TUG with cane to </= 18 seconds with supervision    Time 4    Period Weeks    Status  New    Target Date 05/25/20           PT Long Term Goals - 04/25/20 1014      PT LONG TERM GOAL #1   Title Patient will be able to ambulate >1000 feet with quad cane and supervision to improve community ambulation    Baseline 800' with quad cane outside with frequent rest breaks and min-mod A when pt fatigues    Time 8    Period Weeks    Status Revised    Target Date 06/24/20      PT LONG TERM GOAL #2   Title Patient will be able to perform TUG <15 seconds with RW to improve overall functional mobility    Baseline 36 seconds with RW, 22 seconds with straight cane (04/18/20)    Time 8    Period Weeks    Status Revised    Target Date 06/24/20      PT LONG TERM GOAL #3   Title Patient will be able to go up and down 17 steps with use of one rail and cane, step to sequence with Supervision    Baseline 16 stairs with bilat rails and min A    Time 8    Period Weeks    Status Revised    Target Date 06/24/20      PT LONG TERM GOAL #4   Title Patient will demo improvement on Berg balance scale to at least 48/56 to improve functional balance and reduce fall risk    Baseline 41/56    Time 8    Period Weeks    Status Revised    Target Date 06/24/20                 Plan - 04/25/20 0947    Clinical Impression Statement Treatment session focused on assessment of progress towards LTG.  Pt is making good progress and has met 1/4 LTG, partially meeting and making progress towards other 3 goals.  Pt did meet TUG goal and demonstrates improved balance and safety performing gait with cane over indoor, level surfaces.  Pt did not fully meet stair goal and continues to require bilat UE support for safety when ascending and descending and pt not able to ambulate community distances safely with cane outdoors.  Pt also demonstrates improvement in standing balance  but continues to require UE support during single limb stance activities and narrow BOS.  PT also continues to be limited by fatigue  and tremors when fatigued. Pt will benefit from continued skilled PT interventions to address ongoing impairments to maximize functional mobility independence and decrease falls risk.    Personal Factors and Comorbidities Comorbidity 3+;Past/Current Experience;Time since onset of injury/illness/exacerbation;Transportation;Fitness;Age    Comorbidities CHF, ESRD, HTN    Examination-Activity Limitations Bathing;Bed Mobility;Caring for Others;Carry;Dressing;Hygiene/Grooming;Lift;Locomotion Level;Self Feeding;Sit;Squat;Stairs;Stand;Toileting;Transfers    Examination-Participation Restrictions Cleaning;Community Activity;Driving;Interpersonal Relationship;Laundry;Medication Management;Meal Prep;Shop;Yard Work    Stability/Clinical Decision Making Evolving/Moderate complexity    Rehab Potential Good    PT Frequency 2x / week    PT Duration 8 weeks    PT Treatment/Interventions ADLs/Self Care Home Management;Gait training;Stair training;Functional mobility training;Therapeutic activities;Therapeutic exercise;Balance training;Neuromuscular re-education;Patient/family education;Manual techniques;Passive range of motion;Joint Manipulations    PT Next Visit Plan Gait outside with RW if weather allows.  Standing balance activities reducing UE support, SLS activities.  Stair negotiation decreasing UE support    PT [place]- updated on 04/12/20    Consulted and Agree with Plan of Care Patient           Patient will benefit from skilled therapeutic intervention in order to improve the following deficits and impairments:  Abnormal gait, Decreased activity tolerance, Decreased balance, Decreased mobility, Decreased endurance, Decreased coordination, Decreased range of motion, Decreased safety awareness, Decreased strength, Difficulty walking, Impaired flexibility, Postural dysfunction, Impaired UE functional use, Impaired tone  Visit Diagnosis: Unsteadiness on feet  Muscle weakness  (generalized)  Other abnormalities of gait and mobility     Problem List Patient Active Problem List   Diagnosis Date Noted  . 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 Gait abnormality 08/12/2019  . History of gout 06/17/2019  . Bowel incontinence 06/09/2019  . Anoxic brain injury ([place])   . History of cardiac arrest 04/27/2019  . History of non-ST elevation myocardial infarction (NSTEMI) 04/18/2019  . Morbid obesity ([place]) 04/14/2019  . Diabetic neuropathy ([place]) 04/02/2016  . Hypertension associated with diabetes ([place]) 10/03/2015  . HLD (hyperlipidemia) 09/06/2015  . End stage renal disease on dialysis ([place]) 08/31/2015  . DM (diabetes mellitus), type 2 with renal complications ([place]) 60/73/7106  . OSA treated with BiPAP 12/12/2013  . Coronary atherosclerosis 11/01/2009    Rico Junker, PT, DPT 04/25/20    10:12 AM    [facility] [address] [place], [place], [address] Phone: [phone]   Fax:  [phone]  Name: Troy Wells MRN: [id number] Date of Birth: [birth year]

## 2020-04-26 DIAGNOSIS — N186 End stage renal disease: Secondary | ICD-10-CM | POA: Diagnosis not present

## 2020-04-26 DIAGNOSIS — Z992 Dependence on renal dialysis: Secondary | ICD-10-CM | POA: Diagnosis not present

## 2020-04-26 DIAGNOSIS — N2581 Secondary hyperparathyroidism of renal origin: Secondary | ICD-10-CM | POA: Diagnosis not present

## 2020-04-26 DIAGNOSIS — E1121 Type 2 diabetes mellitus with diabetic nephropathy: Secondary | ICD-10-CM | POA: Diagnosis not present

## 2020-04-26 DIAGNOSIS — D631 Anemia in chronic kidney disease: Secondary | ICD-10-CM | POA: Diagnosis not present

## 2020-04-27 ENCOUNTER — Encounter: Payer: Self-pay | Admitting: Physical Therapy

## 2020-04-27 ENCOUNTER — Other Ambulatory Visit: Payer: Self-pay

## 2020-04-27 ENCOUNTER — Ambulatory Visit: Payer: Medicare Other | Admitting: Physical Therapy

## 2020-04-27 DIAGNOSIS — M6281 Muscle weakness (generalized): Secondary | ICD-10-CM | POA: Diagnosis not present

## 2020-04-27 DIAGNOSIS — R2689 Other abnormalities of gait and mobility: Secondary | ICD-10-CM | POA: Diagnosis not present

## 2020-04-27 DIAGNOSIS — R2681 Unsteadiness on feet: Secondary | ICD-10-CM | POA: Diagnosis not present

## 2020-04-28 DIAGNOSIS — D631 Anemia in chronic kidney disease: Secondary | ICD-10-CM | POA: Diagnosis not present

## 2020-04-28 DIAGNOSIS — N186 End stage renal disease: Secondary | ICD-10-CM | POA: Diagnosis not present

## 2020-04-28 DIAGNOSIS — E1121 Type 2 diabetes mellitus with diabetic nephropathy: Secondary | ICD-10-CM | POA: Diagnosis not present

## 2020-04-28 DIAGNOSIS — N2581 Secondary hyperparathyroidism of renal origin: Secondary | ICD-10-CM | POA: Diagnosis not present

## 2020-04-28 DIAGNOSIS — Z992 Dependence on renal dialysis: Secondary | ICD-10-CM | POA: Diagnosis not present

## 2020-04-28 NOTE — Therapy (Signed)
[facility] [address] [place], [place], [address] Phone: [phone]   Fax:  [phone]  Physical Therapy Treatment  Patient Details  Name: Troy Wells MRN: [id number] Date of Birth: [birth year] Referring Provider (PT): Dr. Andrena Mews   Encounter Date: 04/27/2020     04/27/20 0808  PT Visits / Re-Eval  Visit Number 33  Number of Visits 48  Date for PT Re-Evaluation 06/24/20  Authorization  Authorization Type Medicare parts A&B; KX modifier required now  Progress Note Due on Visit 40  PT Time Calculation  PT Start Time 0804  PT Stop Time 0845  PT Time Calculation (min) 41 min  PT - End of Session  Equipment Utilized During Treatment Gait belt  Activity Tolerance Patient tolerated treatment well  Behavior During Therapy [facility] for tasks assessed/performed    Past Medical History:  Diagnosis Date  . CARDIAC ARREST 11/01/2009   Qualifier: Diagnosis of  By: Selena Batten CMA, Jewel    . Coagulase negative Staphylococcus bacteremia 06/30/2019  . Colon polyps 01/02/2016   Colonoscopy July 2017 - One 3 mm polyp in the transverse colon, removed with a cold biopsy forceps. Resected and retrieved. - One 3 mm polyp in the rectum, removed with a cold biopsy forceps. Resected and retrieved. - Diverticulosis in the entire examined colon. - Non-bleeding internal hemorrhoids. - The examination was otherwise normal. - Significant looping which prolonged cecal    . Coronary artery disease 08/15/2009   with AMI  . Coronary atherosclerosis 11/01/2009   Qualifier: Diagnosis of  By: Selena Batten CMA, Jewel    . Dialysis patient ([place]) 09/27/2014   mon, wed, and fri  . Diverticulosis of colon without hemorrhage 01/02/2016   Colonoscopy July 2017 - One 3 mm polyp in the transverse colon, removed with a cold biopsy forceps. Resected and retrieved. - One 3 mm polyp in the rectum, removed with a cold biopsy forceps. Resected and  retrieved. - Diverticulosis in the entire examined colon. - Non-bleeding internal hemorrhoids. - The examination was otherwise normal. - Significant looping which prolonged cecal    . DM (diabetes mellitus), type 2 with renal complications ([place]) 16/94/5038  . Essential hypertension 11/01/2009   Qualifier: Diagnosis of  By: Selena Batten CMA, Jewel    . Gout 10/21/2018  . History of acute respiratory distress syndrome (ARDS) due to COVID-19 virus   . History of cardiac arrest 04/27/2019  . History of non-ST elevation myocardial infarction (NSTEMI) 04/18/2019  . history of respiratory arrest   . Hyperlipidemia   . Hypertension   . Mitral valve regurgitation 09/19/2015   Echo 09/2015   . 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 OSA treated with BiPAP 06/17/2010  . Protein-calorie malnutrition ([place]) 06/01/2019   Poor PO intake, only eating 50% of meals, could benefit from feeding tube if persistently poor PO intake.  . Pulmonary hypertension ([place]) 04/14/2019   RVSP 38 (TTE 2017) but repeat TTE March 2019 with RVSP 25 RAP in 2020: 49mmhg  . Sleep apnea    cpap nightly    Past Surgical History:  Procedure Laterality Date  . AV FISTULA PLACEMENT Left 09/27/2014  . INSERTION OF ARTERIOVENOUS (AV) ARTEGRAFT ARM Left 04/02/2018   Procedure: INSERTION OF ARTERIOVENOUS (AV) ARTEGRAFT ARM LEFT UPPER ARM;  Surgeon: Waynetta Sandy, MD;  Location: [facility];  Service: Vascular;  Laterality: Left;  . INSERTION OF DIALYSIS CATHETER N/A 04/02/2018   Procedure: INSERTION OF DIALYSIS CATHETER, right internal jugular;  Surgeon: Waynetta Sandy, MD;  [facility];  Service: Vascular;  Laterality: N/A;  . NECK SURGERY     C6 & C7 replaced 30 yrs ago per pt  . REVISON OF ARTERIOVENOUS FISTULA Left 04/02/2018   Procedure: REVISION PLICATION OF ARTERIOVENOUS FISTULA ARM;  Surgeon: Waynetta Sandy, MD;  Location: [facility];  Service: Vascular;  Laterality: Left;  . WISDOM TOOTH EXTRACTION      There were  no vitals filed for this visit.      04/27/20 0807  Symptoms/Limitations  Subjective No new complaints. No falls or pain to report. Hoping to go stay at his place over the weekend (currently still living at his daughters house).  Pertinent History CHF, HTN, ERSD  Limitations Standing;Walking;Writing;House hold activities;Lifting  How long can you sit comfortably? supported sitting no problem  How long can you stand comfortably? <5 min  How long can you walk comfortably? 20 feet with walker  Patient Stated Goals improve walking get stronger  Pain Assessment  Currently in Pain? No/denies  Pain Score 0     04/27/20 0809  Transfers  Transfers Sit to Stand;Stand to Sit  Sit to Stand 6: Modified independent (Device/Increase time)  Stand to Sit 6: Modified independent (Device/Increase time)  Ambulation/Gait  Ambulation/Gait Yes  Ambulation/Gait Assistance 4: Min guard;5: Supervision  Ambulation/Gait Assistance Details used RW today on outdoor paved surfaces with 3 standing rest breaks needed to "stretch back". No tremors or propping/sitting rest breaks. No balance loss noted or physical assistance needed.   Ambulation Distance (Feet) 900 Feet (x1 in/outdoors)  Assistive device Rolling walker  Gait Pattern Step-through pattern;Decreased stride length  Ambulation Surface Level;Indoor;Unlevel;Outdoor;Paved  Stairs Yes  Stairs Assistance 4: Min guard  Stairs Assistance Details (indicate cue type and reason) pt min guard assist to ascend and descend with increased time and effort needed with descent  Stair Management Technique Two rails;Step to pattern;Forwards  Number of Stairs 4  Height of Stairs 6  Gait Comments Pt to start performing a walking program at home with RW. To start with going down driveway, walking one lap around [place] and then going back in the house. He is to increase the laps around the cul [place] as able.         PT Short Term Goals - 04/25/20 1012      PT  SHORT TERM GOAL #1   Title Pt will ambulate x 500' outside over pavement with quad cane and supervision    Time 4    Period Weeks    Status Revised    Target Date 05/25/20      PT SHORT TERM GOAL #2   Title Patient will be able to ascend and descend 8 stairs with step to sequence with one rail and cane with supervision    Time 4    Period Weeks    Status Revised    Target Date 05/25/20      PT SHORT TERM GOAL #3   Title Pt will increase BERG score to >/= 44/56    Baseline 41/56    Time 4    Period Weeks    Status New    Target Date 05/25/20      PT SHORT TERM GOAL #4   Title Pt will decrease TUG with cane to </= 18 seconds with supervision    Time 4    Period Weeks    Status New    Target Date 05/25/20  PT Long Term Goals - 04/25/20 1014      PT LONG TERM GOAL #1   Title Patient will be able to ambulate >1000 feet with quad cane and supervision to improve community ambulation    Baseline 800' with quad cane outside with frequent rest breaks and min-mod A when pt fatigues    Time 8    Period Weeks    Status Revised    Target Date 06/24/20      PT LONG TERM GOAL #2   Title Patient will be able to perform TUG <15 seconds with RW to improve overall functional mobility    Baseline 36 seconds with RW, 22 seconds with straight cane (04/18/20)    Time 8    Period Weeks    Status Revised    Target Date 06/24/20      PT LONG TERM GOAL #3   Title Patient will be able to go up and down 17 steps with use of one rail and cane, step to sequence with Supervision    Baseline 16 stairs with bilat rails and min A    Time 8    Period Weeks    Status Revised    Target Date 06/24/20      PT LONG TERM GOAL #4   Title Patient will demo improvement on Berg balance scale to at least 48/56 to improve functional balance and reduce fall risk    Baseline 41/56    Time 8    Period Weeks    Status Revised    Target Date 06/24/20             04/27/20 0809  Plan   Clinical Impression Statement Pt did better with use of RW for longer distances outdoors on uneven surfaces than with cane last session with less rest breaks or assistance needed. Does continue to have increased tremors when fatiuged. The pt is progressing toward goals and should benefit from continued PT to progress toward unmet goals.  Personal Factors and Comorbidities Comorbidity 3+;Past/Current Experience;Time since onset of injury/illness/exacerbation;Transportation;Fitness;Age  Comorbidities CHF, ESRD, HTN  Examination-Activity Limitations Bathing;Bed Mobility;Caring for Others;Carry;Dressing;Hygiene/Grooming;Lift;Locomotion Level;Self Feeding;Sit;Squat;Stairs;Stand;Toileting;Transfers  Examination-Participation Restrictions Cleaning;Community Activity;Driving;Interpersonal Relationship;Laundry;Medication Management;Meal Prep;Shop;Yard Work  Pt will benefit from skilled therapeutic intervention in order to improve on the following deficits Abnormal gait;Decreased activity tolerance;Decreased balance;Decreased mobility;Decreased endurance;Decreased coordination;Decreased range of motion;Decreased safety awareness;Decreased strength;Difficulty walking;Impaired flexibility;Postural dysfunction;Impaired UE functional use;Impaired tone  Stability/Clinical Decision Making Evolving/Moderate complexity  Rehab Potential Good  PT Frequency 2x / week  PT Duration 8 weeks  PT Treatment/Interventions ADLs/Self Care Home Management;Gait training;Stair training;Functional mobility training;Therapeutic activities;Therapeutic exercise;Balance training;Neuromuscular re-education;Patient/family education;Manual techniques;Passive range of motion;Joint Manipulations  PT Next Visit Plan Continue to work on gait outside with RW if weather allows, progress to gravel/grass as able.  Standing balance activities reducing UE support, SLS activities.  Stair negotiation decreasing UE support  PT [facility]- updated on 04/12/20  Consulted and Agree with Plan of Care Patient          Patient will benefit from skilled therapeutic intervention in order to improve the following deficits and impairments:  Abnormal gait, Decreased activity tolerance, Decreased balance, Decreased mobility, Decreased endurance, Decreased coordination, Decreased range of motion, Decreased safety awareness, Decreased strength, Difficulty walking, Impaired flexibility, Postural dysfunction, Impaired UE functional use, Impaired tone  Visit Diagnosis: Unsteadiness on feet  Muscle weakness (generalized)  Other abnormalities of gait and mobility     Problem List Patient Active Problem List  Diagnosis Date Noted  . 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 Gait abnormality 08/12/2019  . History of gout 06/17/2019  . Bowel incontinence 06/09/2019  . Anoxic brain injury ([place])   . History of cardiac arrest 04/27/2019  . History of non-ST elevation myocardial infarction (NSTEMI) 04/18/2019  . Morbid obesity ([place]) 04/14/2019  . Diabetic neuropathy ([place]) 04/02/2016  . Hypertension associated with diabetes ([place]) 10/03/2015  . HLD (hyperlipidemia) 09/06/2015  . End stage renal disease on dialysis ([place]) 08/31/2015  . DM (diabetes mellitus), type 2 with renal complications ([place]) 01/74/9449  . OSA treated with BiPAP 12/12/2013  . Coronary atherosclerosis 11/01/2009    Willow Ora, PTA, [facility] [address] [place], [place] [address] [phone] 04/28/20, 4:18 PM   Name: Troy Wells MRN: [id number] Date of Birth: Oct 06, 1965

## 2020-04-30 IMAGING — DX DG CHEST 1V PORT
1 series · 1 of 1 positions shown · non-contrast
Comparison: 05/07/2019

CLINICAL DATA: Ventilator dependence.

EXAM:
PORTABLE CHEST 1 VIEW

[chest ap]
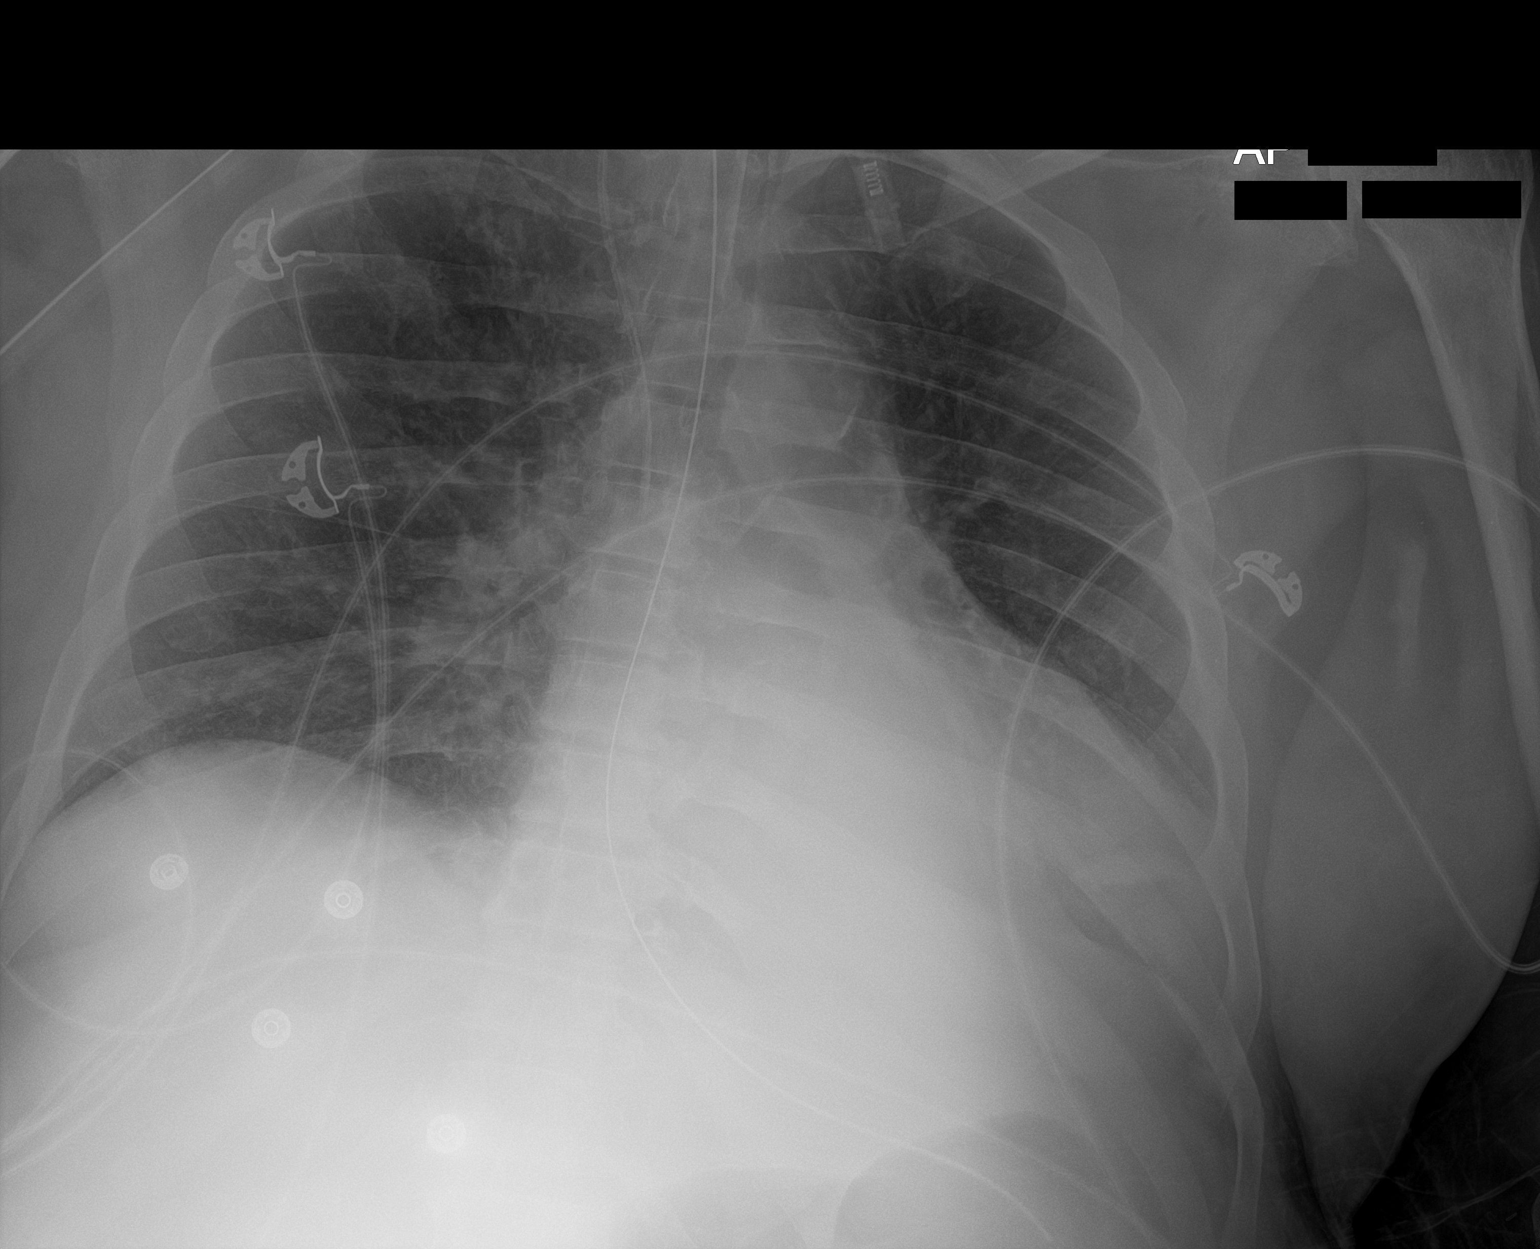

[1 of 1 positions shown; findings below may reference images not displayed]

FINDINGS: 0000 hours. Leftward patient rotation. Endotracheal tube tip is
cm above the base of the carina. The NG tube passes into the stomach
although the distal tip position is not included on the film. Right
IJ central line tip overlies the proximal SVC. Cardiopericardial
silhouette is at upper limits of normal for size. Retrocardiac left
base collapse/consolidation persists. The right basilar airspace
disease and layering pleural effusion seen previously has resolved
in the interval. Telemetry leads overlie the chest.
IMPRESSION: Interval improvement in aeration at the right base with residual
retrocardiac left base atelectasis/infiltrate.

## 2020-05-01 DIAGNOSIS — N2581 Secondary hyperparathyroidism of renal origin: Secondary | ICD-10-CM | POA: Diagnosis not present

## 2020-05-01 DIAGNOSIS — D631 Anemia in chronic kidney disease: Secondary | ICD-10-CM | POA: Diagnosis not present

## 2020-05-01 DIAGNOSIS — E1121 Type 2 diabetes mellitus with diabetic nephropathy: Secondary | ICD-10-CM | POA: Diagnosis not present

## 2020-05-01 DIAGNOSIS — Z992 Dependence on renal dialysis: Secondary | ICD-10-CM | POA: Diagnosis not present

## 2020-05-01 DIAGNOSIS — N186 End stage renal disease: Secondary | ICD-10-CM | POA: Diagnosis not present

## 2020-05-02 ENCOUNTER — Encounter: Payer: Self-pay | Admitting: Physical Therapy

## 2020-05-02 ENCOUNTER — Other Ambulatory Visit: Payer: Self-pay

## 2020-05-02 ENCOUNTER — Ambulatory Visit: Payer: Medicare Other | Admitting: Physical Therapy

## 2020-05-02 DIAGNOSIS — R2681 Unsteadiness on feet: Secondary | ICD-10-CM | POA: Diagnosis not present

## 2020-05-02 DIAGNOSIS — M6281 Muscle weakness (generalized): Secondary | ICD-10-CM

## 2020-05-02 DIAGNOSIS — R2689 Other abnormalities of gait and mobility: Secondary | ICD-10-CM | POA: Diagnosis not present

## 2020-05-02 NOTE — Therapy (Signed)
[facility] [address] [place], [place], [address] Phone: [phone]   Fax:  [phone]  Physical Therapy Treatment  Patient Details  Name: Troy Wells MRN: [id number] Date of Birth: 08-Nov-1965 Referring Provider (PT): Dr. Andrena Mews   Encounter Date: 05/02/2020   PT End of Session - 05/02/20 0809    Visit Number 34    Number of Visits 48    Date for PT Re-Evaluation 06/24/20    Authorization Type Medicare parts A&B; KX modifier required now    Progress Note Due on Visit 64    PT Start Time 0805    PT Stop Time 0844    PT Time Calculation (min) 39 min    Equipment Utilized During Treatment Gait belt    Activity Tolerance Patient tolerated treatment well    Behavior During Therapy [facility] for tasks assessed/performed           Past Medical History:  Diagnosis Date  . CARDIAC ARREST 11/01/2009   Qualifier: Diagnosis of  By: Selena Batten CMA, Jewel    . Coagulase negative Staphylococcus bacteremia 06/30/2019  . Colon polyps 01/02/2016   Colonoscopy July 2017 - One 3 mm polyp in the transverse colon, removed with a cold biopsy forceps. Resected and retrieved. - One 3 mm polyp in the rectum, removed with a cold biopsy forceps. Resected and retrieved. - Diverticulosis in the entire examined colon. - Non-bleeding internal hemorrhoids. - The examination was otherwise normal. - Significant looping which prolonged cecal    . Coronary artery disease 08/15/2009   with AMI  . Coronary atherosclerosis 11/01/2009   Qualifier: Diagnosis of  By: Selena Batten CMA, Jewel    . Dialysis patient ([place]) 09/27/2014   mon, wed, and fri  . Diverticulosis of colon without hemorrhage 01/02/2016   Colonoscopy July 2017 - One 3 mm polyp in the transverse colon, removed with a cold biopsy forceps. Resected and retrieved. - One 3 mm polyp in the rectum, removed with a cold biopsy forceps. Resected and retrieved. - Diverticulosis in the  entire examined colon. - Non-bleeding internal hemorrhoids. - The examination was otherwise normal. - Significant looping which prolonged cecal    . DM (diabetes mellitus), type 2 with renal complications ([place]) 51/07/5850  . Essential hypertension 11/01/2009   Qualifier: Diagnosis of  By: Selena Batten CMA, Jewel    . Gout 10/21/2018  . History of acute respiratory distress syndrome (ARDS) due to COVID-19 virus   . History of cardiac arrest 04/27/2019  . History of non-ST elevation myocardial infarction (NSTEMI) 04/18/2019  . history of respiratory arrest   . Hyperlipidemia   . Hypertension   . Mitral valve regurgitation 09/19/2015   Echo 09/2015   . 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 OSA treated with BiPAP 06/17/2010  . Protein-calorie malnutrition ([place]) 06/01/2019   Poor PO intake, only eating 50% of meals, could benefit from feeding tube if persistently poor PO intake.  . Pulmonary hypertension ([place]) 04/14/2019   RVSP 38 (TTE 2017) but repeat TTE March 2019 with RVSP 25 RAP in 2020: 18mmhg  . Sleep apnea    cpap nightly    Past Surgical History:  Procedure Laterality Date  . AV FISTULA PLACEMENT Left 09/27/2014  . INSERTION OF ARTERIOVENOUS (AV) ARTEGRAFT ARM Left 04/02/2018   Procedure: INSERTION OF ARTERIOVENOUS (AV) ARTEGRAFT ARM LEFT UPPER ARM;  Surgeon: Waynetta Sandy, MD;  Location: [facility];  Service: Vascular;  Laterality: Left;  . INSERTION OF DIALYSIS CATHETER N/A 04/02/2018  Procedure: INSERTION OF DIALYSIS CATHETER, right internal jugular;  Surgeon: Waynetta Sandy, MD;  Location: [facility];  Service: Vascular;  Laterality: N/A;  . NECK SURGERY     C6 & C7 replaced 30 yrs ago per pt  . REVISON OF ARTERIOVENOUS FISTULA Left 04/02/2018   Procedure: REVISION PLICATION OF ARTERIOVENOUS FISTULA ARM;  Surgeon: Waynetta Sandy, MD;  Location: [facility];  Service: Vascular;  Laterality: Left;  . WISDOM TOOTH EXTRACTION      There were no vitals filed for this  visit.   Subjective Assessment - 05/02/20 0807    Subjective Reports legs feeling weaker today. Unsure why. Reports near fall with getting out of garage to come to PT today while trying to step off step. Right leg wanted to buckle and then tremors started. Held the walker for moment and then was abe to fully step down and sit in wheelchair.    Pertinent History CHF, HTN, ERSD    Limitations Standing;Walking;Writing;House hold activities;Lifting    How long can you sit comfortably? supported sitting no problem    How long can you stand comfortably? <5 min    How long can you walk comfortably? 20 feet with walker    Patient Stated Goals improve walking get stronger    Currently in Pain? No/denies    Pain Score 0-No pain                [facility] Adult PT Treatment/Exercise - 05/02/20 0810      Transfers   Transfers Sit to Stand;Stand to Sit    Sit to Stand 6: Modified independent (Device/Increase time)    Stand to Sit 6: Modified independent (Device/Increase time)      Ambulation/Gait   Ambulation/Gait Yes    Ambulation/Gait Assistance 4: Min guard    Ambulation/Gait Assistance Details use of RW today due to UE/LE tremors with wheelchair follow for safety. Pt needing increased time for step initiation at times as well.     Ambulation Distance (Feet) 70 Feet   x2   Assistive device Rolling walker    Gait Pattern Step-through pattern;Decreased stride length    Ambulation Surface Level;Indoor      Knee/Hip Exercises: Aerobic   Other Aerobic Scifit with UE/LE's level 5.0 x 8 minutes with goal >/= 50 rpm for strengthening and activity tolerance.              [facility]) - 05/02/20 7989    PWR! Up 15    PWR! Rock 15    PWR! Twist 15    PWR! Step 15    Comments cues on correct form and technique                 PT Short Term Goals - 04/25/20 1012      PT SHORT TERM GOAL #1   Title Pt will ambulate x 500' outside over pavement with quad cane and supervision    Time 4     Period Weeks    Status Revised    Target Date 05/25/20      PT SHORT TERM GOAL #2   Title Patient will be able to ascend and descend 8 stairs with step to sequence with one rail and cane with supervision    Time 4    Period Weeks    Status Revised    Target Date 05/25/20      PT SHORT TERM GOAL #3   Title Pt will increase BERG score to >/= 44/56  Baseline 41/56    Time 4    Period Weeks    Status New    Target Date 05/25/20      PT SHORT TERM GOAL #4   Title Pt will decrease TUG with cane to </= 18 seconds with supervision    Time 4    Period Weeks    Status New    Target Date 05/25/20             PT Long Term Goals - 04/25/20 1014      PT LONG TERM GOAL #1   Title Patient will be able to ambulate >1000 feet with quad cane and supervision to improve community ambulation    Baseline 800' with quad cane outside with frequent rest breaks and min-mod A when pt fatigues    Time 8    Period Weeks    Status Revised    Target Date 06/24/20      PT LONG TERM GOAL #2   Title Patient will be able to perform TUG <15 seconds with RW to improve overall functional mobility    Baseline 36 seconds with RW, 22 seconds with straight cane (04/18/20)    Time 8    Period Weeks    Status Revised    Target Date 06/24/20      PT LONG TERM GOAL #3   Title Patient will be able to go up and down 17 steps with use of one rail and cane, step to sequence with Supervision    Baseline 16 stairs with bilat rails and min A    Time 8    Period Weeks    Status Revised    Target Date 06/24/20      PT LONG TERM GOAL #4   Title Patient will demo improvement on Berg balance scale to at least 48/56 to improve functional balance and reduce fall risk    Baseline 41/56    Time 8    Period Weeks    Status Revised    Target Date 06/24/20                 Plan - 05/02/20 0809    Clinical Impression Statement Today's skilled session continued to focus on gait and strengthening. RW  utilized today due to pt with increased tremors present. No other issues noted or reported in session. The pt is progressing and should benefit from continued PT to progress toward unmet goals.    Personal Factors and Comorbidities Comorbidity 3+;Past/Current Experience;Time since onset of injury/illness/exacerbation;Transportation;Fitness;Age    Comorbidities CHF, ESRD, HTN    Examination-Activity Limitations Bathing;Bed Mobility;Caring for Others;Carry;Dressing;Hygiene/Grooming;Lift;Locomotion Level;Self Feeding;Sit;Squat;Stairs;Stand;Toileting;Transfers    Examination-Participation Restrictions Cleaning;Community Activity;Driving;Interpersonal Relationship;Laundry;Medication Management;Meal Prep;Shop;Yard Work    Stability/Clinical Decision Making Evolving/Moderate complexity    Rehab Potential Good    PT Frequency 2x / week    PT Duration 8 weeks    PT Treatment/Interventions ADLs/Self Care Home Management;Gait training;Stair training;Functional mobility training;Therapeutic activities;Therapeutic exercise;Balance training;Neuromuscular re-education;Patient/family education;Manual techniques;Passive range of motion;Joint Manipulations    PT Next Visit Plan Continue to work on gait outside with RW if weather allows, progress to gravel/grass as able.  Standing balance activities reducing UE support, SLS activities.  Stair negotiation decreasing UE support    PT [facility]- updated on 04/12/20    Consulted and Agree with Plan of Care Patient           Patient will benefit from skilled therapeutic intervention in order to improve the following deficits  and impairments:  Abnormal gait, Decreased activity tolerance, Decreased balance, Decreased mobility, Decreased endurance, Decreased coordination, Decreased range of motion, Decreased safety awareness, Decreased strength, Difficulty walking, Impaired flexibility, Postural dysfunction, Impaired UE functional use, Impaired  tone  Visit Diagnosis: Unsteadiness on feet  Muscle weakness (generalized)  Other abnormalities of gait and mobility     Problem List Patient Active Problem List   Diagnosis Date Noted  . 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 Gait abnormality 08/12/2019  . History of gout 06/17/2019  . Bowel incontinence 06/09/2019  . Anoxic brain injury ([place])   . History of cardiac arrest 04/27/2019  . History of non-ST elevation myocardial infarction (NSTEMI) 04/18/2019  . Morbid obesity ([place]) 04/14/2019  . Diabetic neuropathy ([place]) 04/02/2016  . Hypertension associated with diabetes ([place]) 10/03/2015  . HLD (hyperlipidemia) 09/06/2015  . End stage renal disease on dialysis ([place]) 08/31/2015  . DM (diabetes mellitus), type 2 with renal complications ([place]) 70/48/8891  . OSA treated with BiPAP 12/12/2013  . Coronary atherosclerosis 11/01/2009    Willow Ora, PTA, [facility] [address], [place], [place] [address] [phone] 05/02/20, 4:11 PM   Name: Tyre Beaver MRN: [id number] Date of Birth: [birth year]

## 2020-05-03 DIAGNOSIS — N186 End stage renal disease: Secondary | ICD-10-CM | POA: Diagnosis not present

## 2020-05-03 DIAGNOSIS — D631 Anemia in chronic kidney disease: Secondary | ICD-10-CM | POA: Diagnosis not present

## 2020-05-03 DIAGNOSIS — N2581 Secondary hyperparathyroidism of renal origin: Secondary | ICD-10-CM | POA: Diagnosis not present

## 2020-05-03 DIAGNOSIS — Z992 Dependence on renal dialysis: Secondary | ICD-10-CM | POA: Diagnosis not present

## 2020-05-03 DIAGNOSIS — E1121 Type 2 diabetes mellitus with diabetic nephropathy: Secondary | ICD-10-CM | POA: Diagnosis not present

## 2020-05-04 ENCOUNTER — Encounter: Payer: Self-pay | Admitting: Physical Therapy

## 2020-05-04 ENCOUNTER — Ambulatory Visit: Payer: Medicare Other | Admitting: Physical Therapy

## 2020-05-04 ENCOUNTER — Other Ambulatory Visit: Payer: Self-pay

## 2020-05-04 DIAGNOSIS — R2681 Unsteadiness on feet: Secondary | ICD-10-CM

## 2020-05-04 DIAGNOSIS — R2689 Other abnormalities of gait and mobility: Secondary | ICD-10-CM

## 2020-05-04 DIAGNOSIS — M6281 Muscle weakness (generalized): Secondary | ICD-10-CM

## 2020-05-04 NOTE — Therapy (Signed)
[facility] [address] [place], [place], [address] Phone: [phone]   Fax:  [phone]  Physical Therapy Treatment  Patient Details  Name: Troy Wells MRN: [id number] Date of Birth: [birth year] Referring Provider (PT): Dr. Andrena Mews   Encounter Date: 05/04/2020   PT End of Session - 05/04/20 0950    Visit Number 35    Number of Visits 70    Date for PT Re-Evaluation 06/24/20    Authorization Type Medicare parts A&B; KX modifier required now    Progress Note Due on Visit 68    PT Start Time 0805    PT Stop Time 0848    PT Time Calculation (min) 43 min    Activity Tolerance Patient tolerated treatment well    Behavior During Therapy [facility] for tasks assessed/performed           Past Medical History:  Diagnosis Date  . CARDIAC ARREST 11/01/2009   Qualifier: Diagnosis of  By: Selena Batten CMA, [place]    . Coagulase negative Staphylococcus bacteremia 06/30/2019  . Colon polyps 01/02/2016   Colonoscopy July 2017 - One 3 mm polyp in the transverse colon, removed with a cold biopsy forceps. Resected and retrieved. - One 3 mm polyp in the rectum, removed with a cold biopsy forceps. Resected and retrieved. - Diverticulosis in the entire examined colon. - Non-bleeding internal hemorrhoids. - The examination was otherwise normal. - Significant looping which prolonged cecal    . Coronary artery disease 08/15/2009   with AMI  . Coronary atherosclerosis 11/01/2009   Qualifier: Diagnosis of  By: Selena Batten CMA, Jewel    . Dialysis patient ([place]) 09/27/2014   mon, wed, and fri  . Diverticulosis of colon without hemorrhage 01/02/2016   Colonoscopy July 2017 - One 3 mm polyp in the transverse colon, removed with a cold biopsy forceps. Resected and retrieved. - One 3 mm polyp in the rectum, removed with a cold biopsy forceps. Resected and retrieved. - Diverticulosis in the entire examined colon. - Non-bleeding internal  hemorrhoids. - The examination was otherwise normal. - Significant looping which prolonged cecal    . DM (diabetes mellitus), type 2 with renal complications ([place]) 50/08/7046  . Essential hypertension 11/01/2009   Qualifier: Diagnosis of  By: Selena Batten CMA, Jewel    . Gout 10/21/2018  . History of acute respiratory distress syndrome (ARDS) due to COVID-19 virus   . History of cardiac arrest 04/27/2019  . History of non-ST elevation myocardial infarction (NSTEMI) 04/18/2019  . history of respiratory arrest   . Hyperlipidemia   . Hypertension   . Mitral valve regurgitation 09/19/2015   Echo 09/2015   . 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 OSA treated with BiPAP 06/17/2010  . Protein-calorie malnutrition ([place]) 06/01/2019   Poor PO intake, only eating 50% of meals, could benefit from feeding tube if persistently poor PO intake.  . Pulmonary hypertension ([place]) 04/14/2019   RVSP 38 (TTE 2017) but repeat TTE March 2019 with RVSP 25 RAP in 2020: 70mmhg  . Sleep apnea    cpap nightly    Past Surgical History:  Procedure Laterality Date  . AV FISTULA PLACEMENT Left 09/27/2014  . INSERTION OF ARTERIOVENOUS (AV) ARTEGRAFT ARM Left 04/02/2018   Procedure: INSERTION OF ARTERIOVENOUS (AV) ARTEGRAFT ARM LEFT UPPER ARM;  Surgeon: Waynetta Sandy, MD;  Location: [facility];  Service: Vascular;  Laterality: Left;  . INSERTION OF DIALYSIS CATHETER N/A 04/02/2018   Procedure: INSERTION OF DIALYSIS CATHETER, right internal jugular;  Surgeon: Waynetta Sandy, MD;  Location: [facility];  Service: Vascular;  Laterality: N/A;  . NECK SURGERY     C6 & C7 replaced 30 yrs ago per pt  . REVISON OF ARTERIOVENOUS FISTULA Left 04/02/2018   Procedure: REVISION PLICATION OF ARTERIOVENOUS FISTULA ARM;  Surgeon: Waynetta Sandy, MD;  Location: [facility];  Service: Vascular;  Laterality: Left;  . WISDOM TOOTH EXTRACTION      There were no vitals filed for this visit.   Subjective Assessment - 05/04/20 0809     Subjective Pt was out of medication the other day, which was the reason for the increase in his tremors.  Is back on it but doesn't feel like it has taken full effect.    Pertinent History CHF, HTN, ERSD    Limitations Standing;Walking;Writing;House hold activities;Lifting    How long can you sit comfortably? supported sitting no problem    How long can you stand comfortably? <5 min    How long can you walk comfortably? 20 feet with walker    Patient Stated Goals improve walking get stronger    Currently in Pain? No/denies                             [facility] Adult PT Treatment/Exercise - 05/04/20 0817      Ambulation/Gait   Ambulation/Gait Yes    Ambulation/Gait Assistance 4: Min guard    Ambulation/Gait Assistance Details with RW today due to ongoing mild tremors; less than last session.  Still requires increased time to initiate stepping and step through sequencing.    Ambulation Distance (Feet) 50 Feet    Assistive device Rolling walker    Gait Pattern Step-through pattern;Step-to pattern;Decreased step length - right;Decreased step length - left;Decreased stride length;Decreased hip/knee flexion - right;Decreased hip/knee flexion - left;Poor foot clearance - left;Poor foot clearance - right    Ambulation Surface Level;Indoor      Knee/Hip Exercises: Aerobic   Other Aerobic Scifit with UE/LE's level 5.0 x 8 minutes with goal >/= 40-50 rpm for endurance, strengthening and reciprocal movement.      Knee/Hip Exercises: Standing   Forward Step Up Right;Left;10 reps;Hand Hold: 2;Step Height: 4"    Forward Step Up Limitations 2 sets alternating taps x 10, foot up on step x 5 seconds hold and then 10 seconds hold each LE.   Performed 2 sets of full step up x 10 with weight shift forwards without lifting contralateral foot off floor, 5 each with full step up.      Step Down Right;Left;1 set;10 reps;Hand Hold: 2;Step Height: 4"    Step Down Limitations toe tap downs, pt not  able to release foot fully from step when lowering it to the ground                    PT Short Term Goals - 04/25/20 1012      PT SHORT TERM GOAL #1   Title Pt will ambulate x 500' outside over pavement with quad cane and supervision    Time 4    Period Weeks    Status Revised    Target Date 05/25/20      PT SHORT TERM GOAL #2   Title Patient will be able to ascend and descend 8 stairs with step to sequence with one rail and cane with supervision    Time 4    Period Weeks    Status Revised  Target Date 05/25/20      PT SHORT TERM GOAL #3   Title Pt will increase BERG score to >/= 44/56    Baseline 41/56    Time 4    Period Weeks    Status New    Target Date 05/25/20      PT SHORT TERM GOAL #4   Title Pt will decrease TUG with cane to </= 18 seconds with supervision    Time 4    Period Weeks    Status New    Target Date 05/25/20             PT Long Term Goals - 04/25/20 1014      PT LONG TERM GOAL #1   Title Patient will be able to ambulate >1000 feet with quad cane and supervision to improve community ambulation    Baseline 800' with quad cane outside with frequent rest breaks and min-mod A when pt fatigues    Time 8    Period Weeks    Status Revised    Target Date 06/24/20      PT LONG TERM GOAL #2   Title Patient will be able to perform TUG <15 seconds with RW to improve overall functional mobility    Baseline 36 seconds with RW, 22 seconds with straight cane (04/18/20)    Time 8    Period Weeks    Status Revised    Target Date 06/24/20      PT LONG TERM GOAL #3   Title Patient will be able to go up and down 17 steps with use of one rail and cane, step to sequence with Supervision    Baseline 16 stairs with bilat rails and min A    Time 8    Period Weeks    Status Revised    Target Date 06/24/20      PT LONG TERM GOAL #4   Title Patient will demo improvement on Berg balance scale to at least 48/56 to improve functional balance and  reduce fall risk    Baseline 41/56    Time 8    Period Weeks    Status Revised    Target Date 06/24/20                 Plan - 05/04/20 0950    Clinical Impression Statement Pt required less assistance for balance and gait today and did not require w/c follow due to decreased tremors today.  Continued to focus on reciprocal LE movement/timing, balance and timing/sequencing for stepping up and down a single step.  Required intermittent sitting rest breaks due to fatigue and when tremors increased.  Will continue to address and progress towards LTG.    Personal Factors and Comorbidities Comorbidity 3+;Past/Current Experience;Time since onset of injury/illness/exacerbation;Transportation;Fitness;Age    Comorbidities CHF, ESRD, HTN    Examination-Activity Limitations Bathing;Bed Mobility;Caring for Others;Carry;Dressing;Hygiene/Grooming;Lift;Locomotion Level;Self Feeding;Sit;Squat;Stairs;Stand;Toileting;Transfers    Examination-Participation Restrictions Cleaning;Community Activity;Driving;Interpersonal Relationship;Laundry;Medication Management;Meal Prep;Shop;Yard Work    Stability/Clinical Decision Making Evolving/Moderate complexity    Rehab Potential Good    PT Frequency 2x / week    PT Duration 8 weeks    PT Treatment/Interventions ADLs/Self Care Home Management;Gait training;Stair training;Functional mobility training;Therapeutic activities;Therapeutic exercise;Balance training;Neuromuscular re-education;Patient/family education;Manual techniques;Passive range of motion;Joint Manipulations    PT Next Visit Plan Continue to work on gait outside with RW if weather allows, progress to gravel/grass as able.  Standing balance activities reducing UE support, SLS activities.  Stair negotiation decreasing UE support  PT [place]- updated on 04/12/20    Consulted and Agree with Plan of Care Patient           Patient will benefit from skilled therapeutic  intervention in order to improve the following deficits and impairments:  Abnormal gait, Decreased activity tolerance, Decreased balance, Decreased mobility, Decreased endurance, Decreased coordination, Decreased range of motion, Decreased safety awareness, Decreased strength, Difficulty walking, Impaired flexibility, Postural dysfunction, Impaired UE functional use, Impaired tone  Visit Diagnosis: Unsteadiness on feet  Muscle weakness (generalized)  Other abnormalities of gait and mobility     Problem List Patient Active Problem List   Diagnosis Date Noted  . 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 Gait abnormality 08/12/2019  . History of gout 06/17/2019  . Bowel incontinence 06/09/2019  . Anoxic brain injury ([place])   . History of cardiac arrest 04/27/2019  . History of non-ST elevation myocardial infarction (NSTEMI) 04/18/2019  . Morbid obesity ([place]) 04/14/2019  . Diabetic neuropathy ([place]) 04/02/2016  . Hypertension associated with diabetes ([place]) 10/03/2015  . HLD (hyperlipidemia) 09/06/2015  . End stage renal disease on dialysis ([place]) 08/31/2015  . DM (diabetes mellitus), type 2 with renal complications ([place]) 81/18/8677  . OSA treated with BiPAP 12/12/2013  . Coronary atherosclerosis 11/01/2009    Rico Junker, PT, DPT 05/04/20    9:54 AM    [facility] [address] [place], [place], [address] Phone: [phone]   Fax:  [phone]  Name: Troy Wells MRN: [id number] Date of Birth: [birth year]

## 2020-05-05 DIAGNOSIS — N186 End stage renal disease: Secondary | ICD-10-CM | POA: Diagnosis not present

## 2020-05-05 DIAGNOSIS — E1121 Type 2 diabetes mellitus with diabetic nephropathy: Secondary | ICD-10-CM | POA: Diagnosis not present

## 2020-05-05 DIAGNOSIS — Z992 Dependence on renal dialysis: Secondary | ICD-10-CM | POA: Diagnosis not present

## 2020-05-05 DIAGNOSIS — D631 Anemia in chronic kidney disease: Secondary | ICD-10-CM | POA: Diagnosis not present

## 2020-05-05 DIAGNOSIS — N2581 Secondary hyperparathyroidism of renal origin: Secondary | ICD-10-CM | POA: Diagnosis not present

## 2020-05-07 DIAGNOSIS — Z992 Dependence on renal dialysis: Secondary | ICD-10-CM | POA: Diagnosis not present

## 2020-05-07 DIAGNOSIS — N2581 Secondary hyperparathyroidism of renal origin: Secondary | ICD-10-CM | POA: Diagnosis not present

## 2020-05-07 DIAGNOSIS — N186 End stage renal disease: Secondary | ICD-10-CM | POA: Diagnosis not present

## 2020-05-07 DIAGNOSIS — D631 Anemia in chronic kidney disease: Secondary | ICD-10-CM | POA: Diagnosis not present

## 2020-05-07 DIAGNOSIS — E1121 Type 2 diabetes mellitus with diabetic nephropathy: Secondary | ICD-10-CM | POA: Diagnosis not present

## 2020-05-09 ENCOUNTER — Ambulatory Visit: Payer: Medicare Other | Admitting: Physical Therapy

## 2020-05-09 DIAGNOSIS — E1121 Type 2 diabetes mellitus with diabetic nephropathy: Secondary | ICD-10-CM | POA: Diagnosis not present

## 2020-05-09 DIAGNOSIS — Z992 Dependence on renal dialysis: Secondary | ICD-10-CM | POA: Diagnosis not present

## 2020-05-09 DIAGNOSIS — D631 Anemia in chronic kidney disease: Secondary | ICD-10-CM | POA: Diagnosis not present

## 2020-05-09 DIAGNOSIS — N186 End stage renal disease: Secondary | ICD-10-CM | POA: Diagnosis not present

## 2020-05-09 DIAGNOSIS — N2581 Secondary hyperparathyroidism of renal origin: Secondary | ICD-10-CM | POA: Diagnosis not present

## 2020-05-10 ENCOUNTER — Ambulatory Visit: Payer: Medicare Other | Admitting: Physical Therapy

## 2020-05-10 ENCOUNTER — Encounter: Payer: Self-pay | Admitting: Physical Therapy

## 2020-05-10 ENCOUNTER — Other Ambulatory Visit: Payer: Self-pay

## 2020-05-10 DIAGNOSIS — M6281 Muscle weakness (generalized): Secondary | ICD-10-CM | POA: Diagnosis not present

## 2020-05-10 DIAGNOSIS — R2689 Other abnormalities of gait and mobility: Secondary | ICD-10-CM

## 2020-05-10 DIAGNOSIS — R2681 Unsteadiness on feet: Secondary | ICD-10-CM

## 2020-05-10 IMAGING — DX DG CHEST 1V PORT
1 series · 1 of 1 positions shown · non-contrast
Comparison: 05/13/2019

CLINICAL DATA: Fevers.

EXAM:
PORTABLE CHEST 1 VIEW

[chest ap]
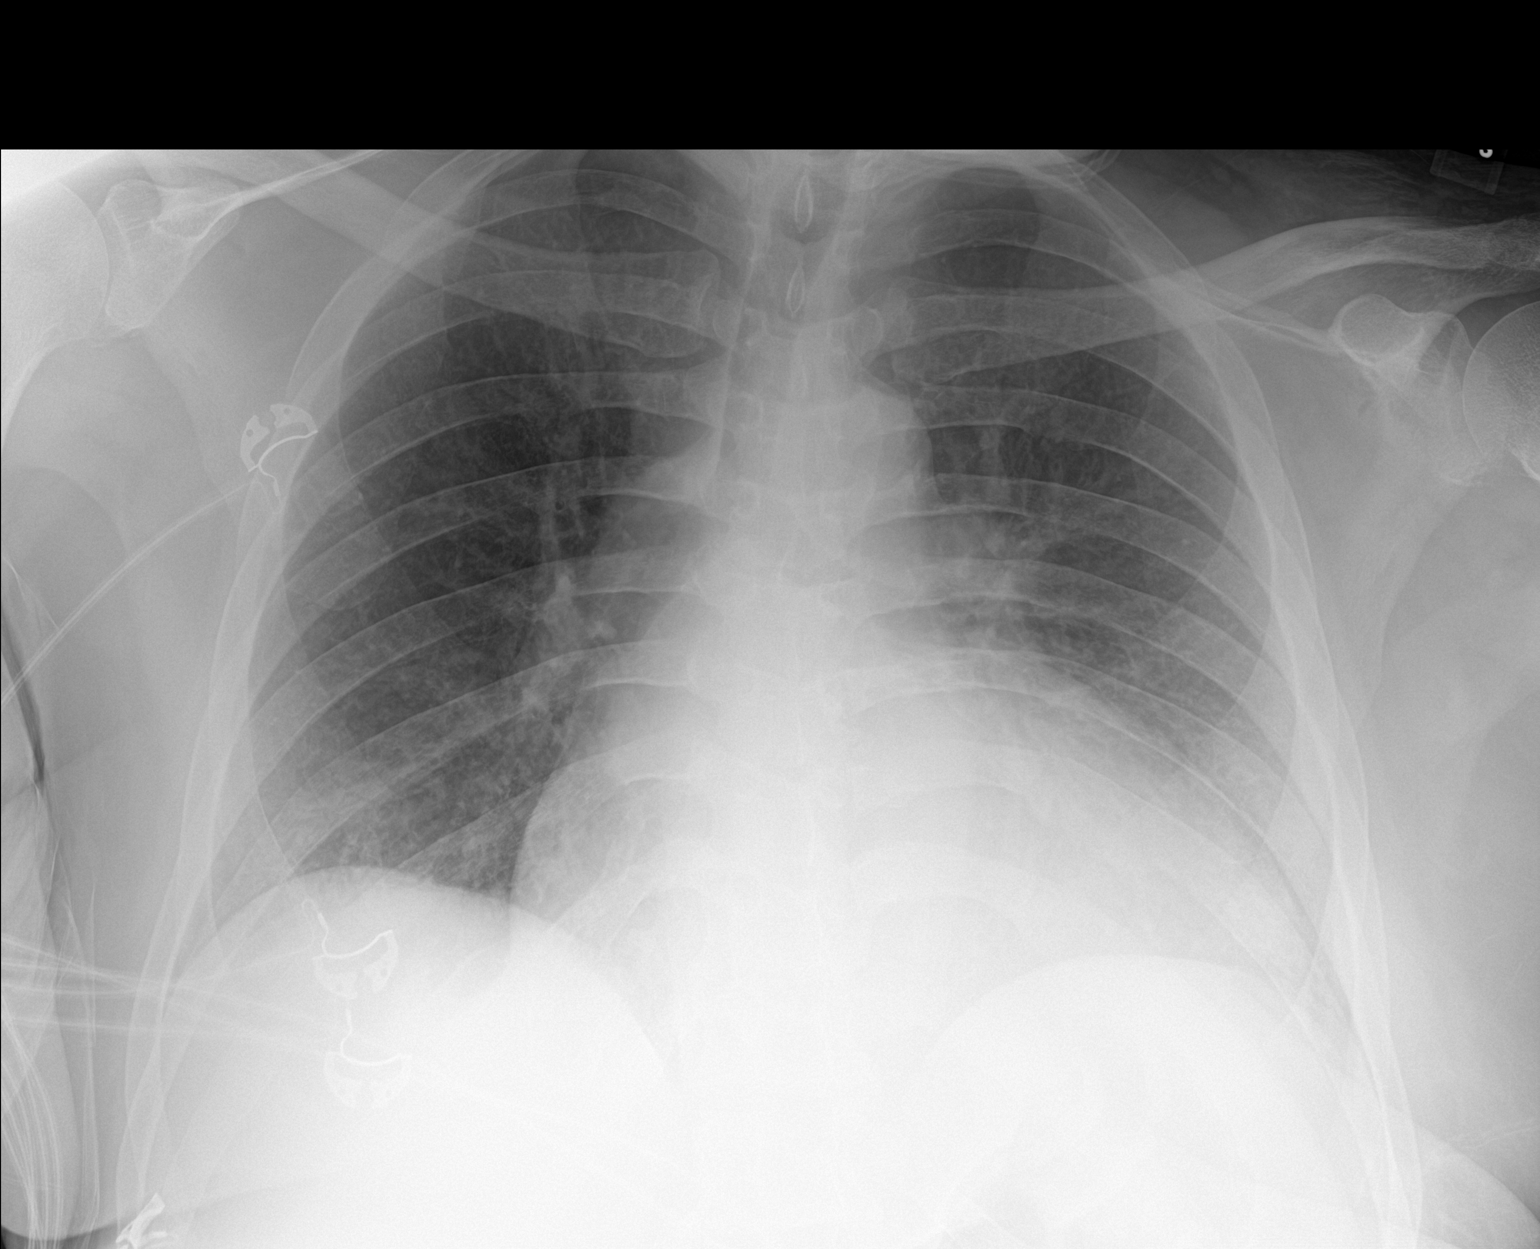

[1 of 1 positions shown; findings below may reference images not displayed]

FINDINGS: Cardiac enlargement. Asymmetric opacity within the left lower lobe
is identified. The left upper lobe and right lung are clear. No
evidence for pleural effusion or edema.
IMPRESSION: 1. Left lower lobe airspace opacity compatible with pneumonia.
2. Cardiac enlargement without heart failure.

## 2020-05-11 IMAGING — CT CT HEAD W/O CM
4 series · 16 of 47 positions shown, 18 images · non-contrast
Comparison: 04/29/2019

CLINICAL DATA: Unwitnessed fall

EXAM:
CT HEAD WITHOUT CONTRAST
TECHNIQUE: Contiguous axial images were obtained from the base of the skull
through the vertex without intravenous contrast.

[Series 3: head wo · axial · 0.46mm/px · z∈[+1266,+1391]mm · 7 of 35 slices shown, 9 images]
[im 5/35  brain]
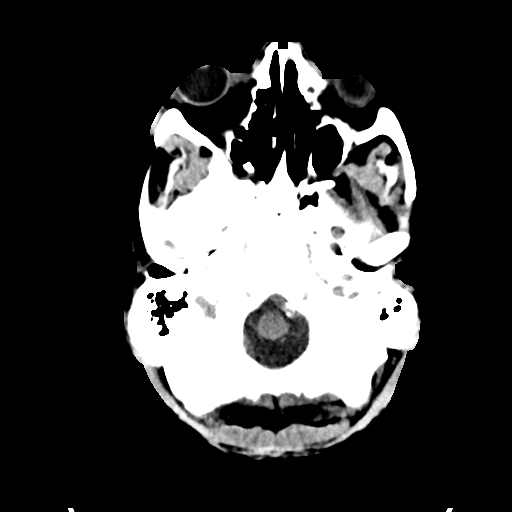
[im 5/35  bone]
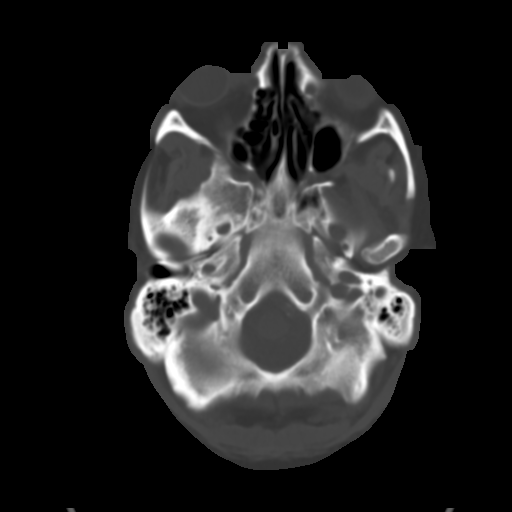
[im 9/35  brain]
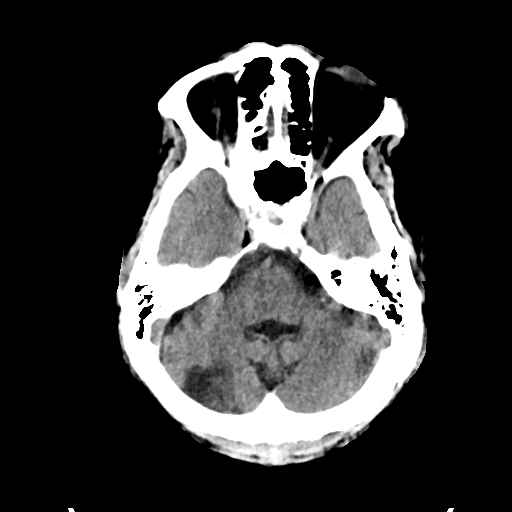
[im 13/35  brain]
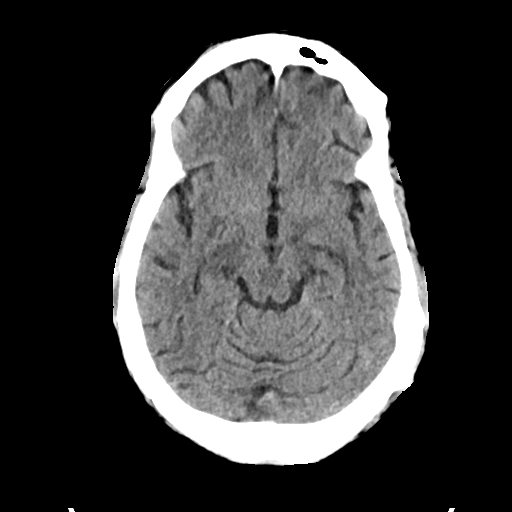
[im 18/35  brain]
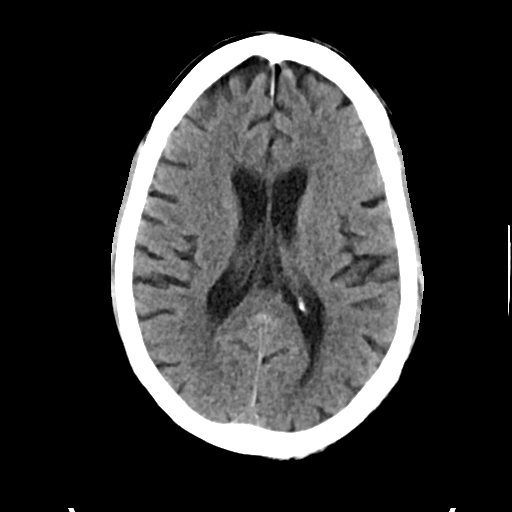
[im 22/35  brain]
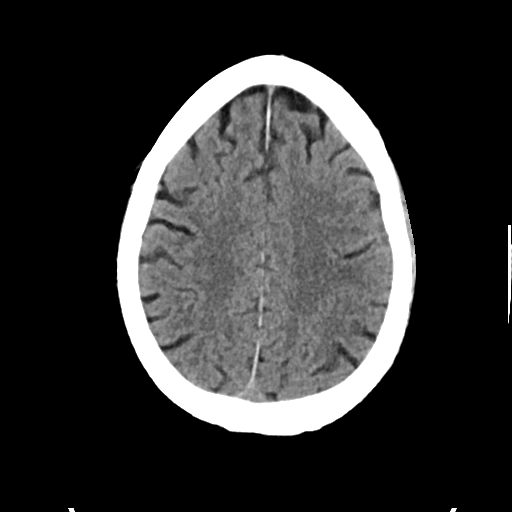
[im 22/35  bone]
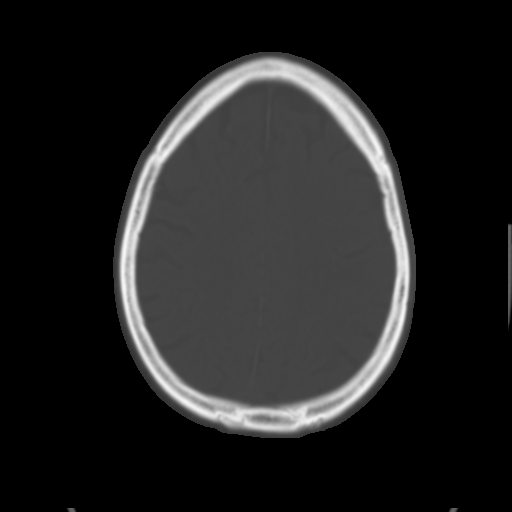
[im 26/35  brain]
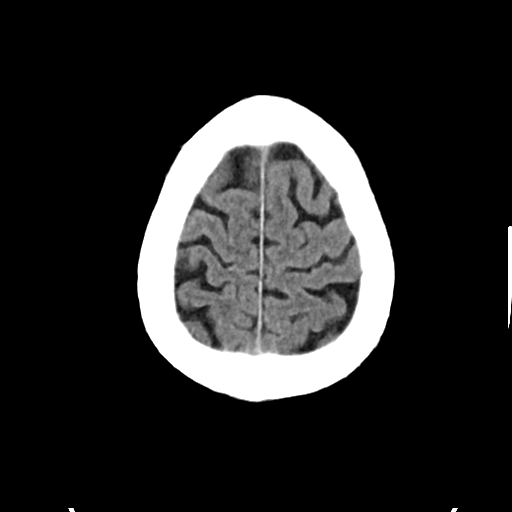
[im 30/35  brain]
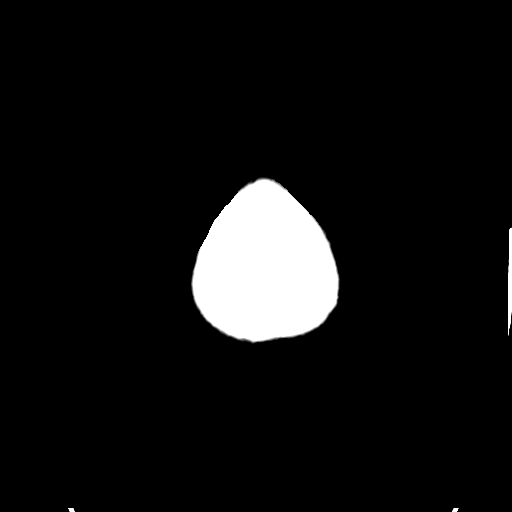

[Series 4: head bone · axial · 0.46mm/px · z∈[+1262,+1296]mm · 3 of 87 slices shown]
[im 9/87  bone]
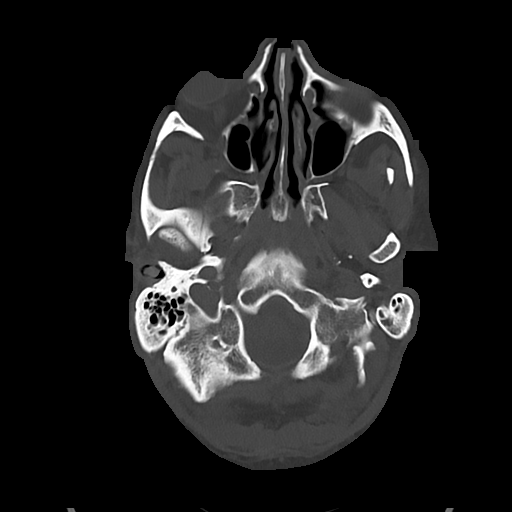
[im 18/87  bone]
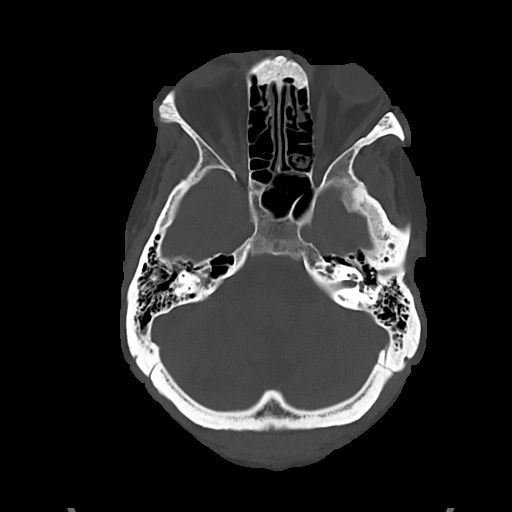
[im 26/87  bone]
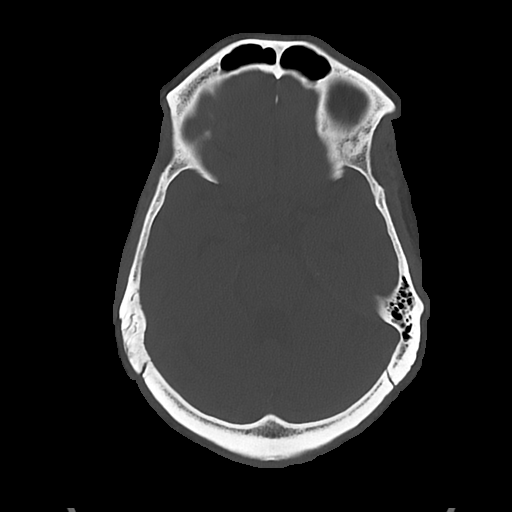

[Series 5: cor soft · coronal · 0.34mm/px · 3 of 74 slices shown]
[im 25/74  brain]
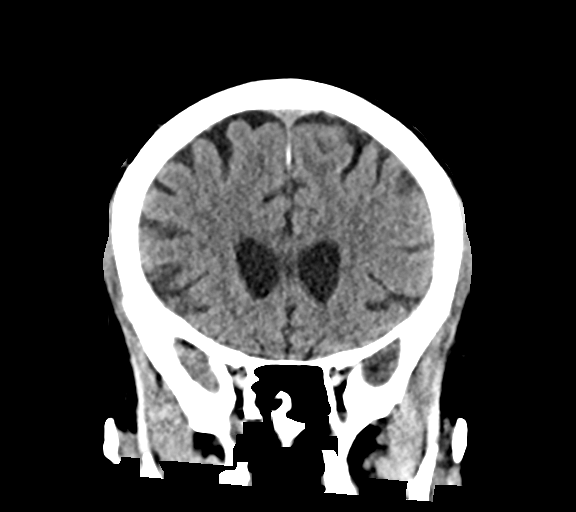
[im 33/74  brain]
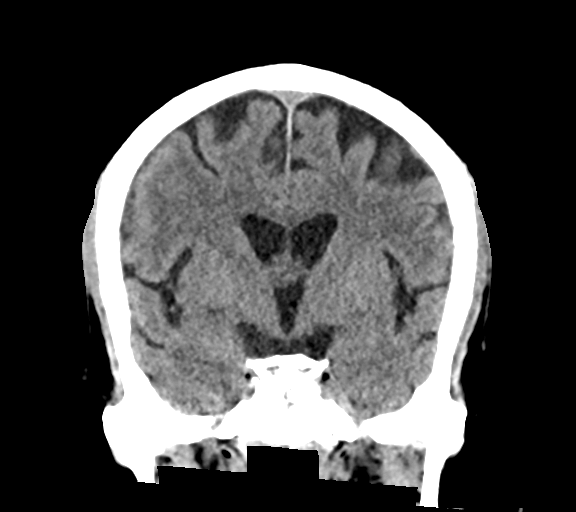
[im 41/74  brain]
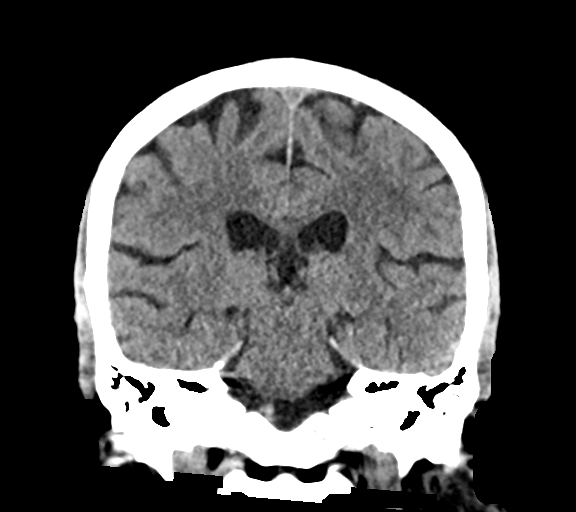

[Series 6: sag soft · sagittal · 0.35mm/px · 3 of 58 slices shown]
[im 20/58  brain]
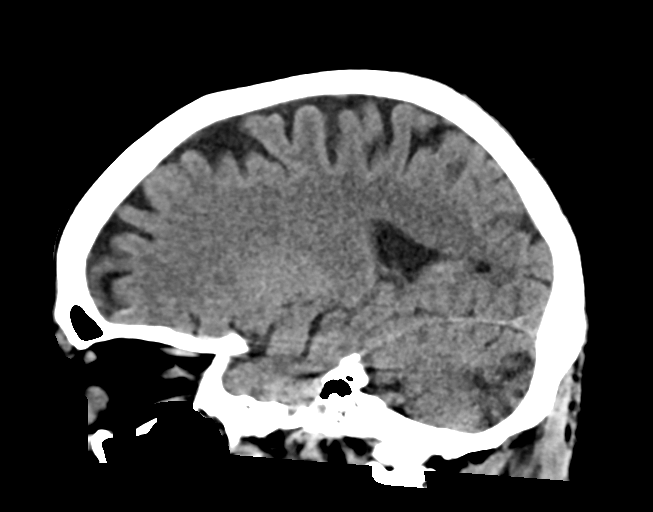
[im 29/58  brain]
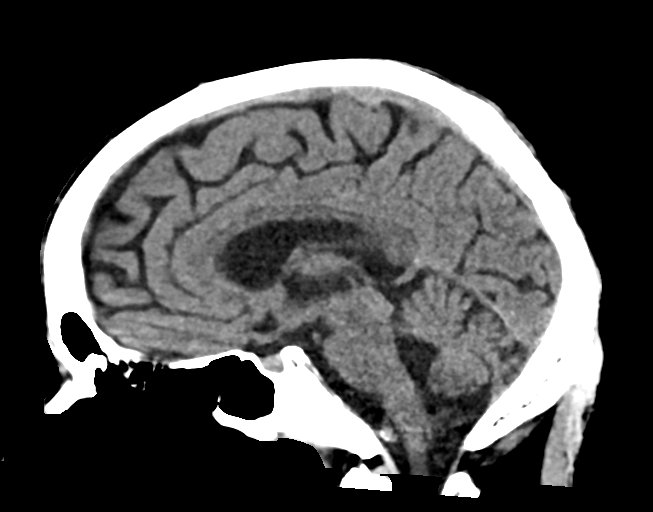
[im 39/58  brain]
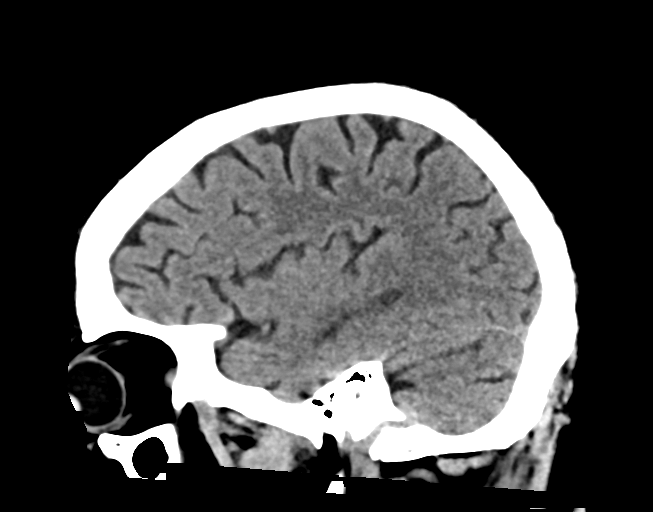

[16 of 47 positions shown; findings below may reference images not displayed]

FINDINGS: Brain: Old right cerebellar infarct, stable. No acute intracranial
abnormality. Specifically, no hemorrhage, hydrocephalus, mass
lesion, acute infarction, or significant intracranial injury.

Vascular: No hyperdense vessel or unexpected calcification.

Skull: No acute calvarial abnormality.

Sinuses/Orbits: Mucosal thickening.  No acute findings.

Other: None
IMPRESSION: No acute intracranial abnormality.

## 2020-05-11 NOTE — Therapy (Signed)
[facility] [address] [place], [place], [address] Phone: [phone]   Fax:  [phone]  Physical Therapy Treatment  Patient Details  Name: Troy Wells MRN: [id number] Date of Birth: [birth year] Referring Provider (PT): Dr. Andrena Mews   Encounter Date: 05/10/2020   PT End of Session - 05/10/20 0806    Visit Number 36    Number of Visits 48    Date for PT Re-Evaluation 06/24/20    Authorization Type Medicare parts A&B; KX modifier required now    Progress Note Due on Visit 23    PT Start Time 0804    PT Stop Time 0845    PT Time Calculation (min) 41 min    Equipment Utilized During Treatment Gait belt    Activity Tolerance Patient tolerated treatment well    Behavior During Therapy [facility] for tasks assessed/performed           Past Medical History:  Diagnosis Date  . CARDIAC ARREST 11/01/2009   Qualifier: Diagnosis of  By: Selena Batten CMA, Jewel    . Coagulase negative Staphylococcus bacteremia 06/30/2019  . Colon polyps 01/02/2016   Colonoscopy July 2017 - One 3 mm polyp in the transverse colon, removed with a cold biopsy forceps. Resected and retrieved. - One 3 mm polyp in the rectum, removed with a cold biopsy forceps. Resected and retrieved. - Diverticulosis in the entire examined colon. - Non-bleeding internal hemorrhoids. - The examination was otherwise normal. - Significant looping which prolonged cecal    . Coronary artery disease 08/15/2009   with AMI  . Coronary atherosclerosis 11/01/2009   Qualifier: Diagnosis of  By: Selena Batten CMA, Jewel    . Dialysis patient ([place]) 09/27/2014   mon, wed, and fri  . Diverticulosis of colon without hemorrhage 01/02/2016   Colonoscopy July 2017 - One 3 mm polyp in the transverse colon, removed with a cold biopsy forceps. Resected and retrieved. - One 3 mm polyp in the rectum, removed with a cold biopsy forceps. Resected and retrieved. - Diverticulosis in the  entire examined colon. - Non-bleeding internal hemorrhoids. - The examination was otherwise normal. - Significant looping which prolonged cecal    . DM (diabetes mellitus), type 2 with renal complications ([place]) 36/14/4315  . Essential hypertension 11/01/2009   Qualifier: Diagnosis of  By: Selena Batten CMA, Jewel    . Gout 10/21/2018  . History of acute respiratory distress syndrome (ARDS) due to COVID-19 virus   . History of cardiac arrest 04/27/2019  . History of non-ST elevation myocardial infarction (NSTEMI) 04/18/2019  . history of respiratory arrest   . Hyperlipidemia   . Hypertension   . Mitral valve regurgitation 09/19/2015   Echo 09/2015   . 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 OSA treated with BiPAP 06/17/2010  . Protein-calorie malnutrition ([place]) 06/01/2019   Poor PO intake, only eating 50% of meals, could benefit from feeding tube if persistently poor PO intake.  . Pulmonary hypertension ([place]) 04/14/2019   RVSP 38 (TTE 2017) but repeat TTE March 2019 with RVSP 25 RAP in 2020: 2mmhg  . Sleep apnea    cpap nightly    Past Surgical History:  Procedure Laterality Date  . AV FISTULA PLACEMENT Left 09/27/2014  . INSERTION OF ARTERIOVENOUS (AV) ARTEGRAFT ARM Left 04/02/2018   Procedure: INSERTION OF ARTERIOVENOUS (AV) ARTEGRAFT ARM LEFT UPPER ARM;  Surgeon: Waynetta Sandy, MD;  Location: [facility];  Service: Vascular;  Laterality: Left;  . INSERTION OF DIALYSIS CATHETER N/A 04/02/2018  Procedure: INSERTION OF DIALYSIS CATHETER, right internal jugular;  Surgeon: Waynetta Sandy, MD;  Location: [facility];  Service: Vascular;  Laterality: N/A;  . NECK SURGERY     C6 & C7 replaced 30 yrs ago per pt  . REVISON OF ARTERIOVENOUS FISTULA Left 04/02/2018   Procedure: REVISION PLICATION OF ARTERIOVENOUS FISTULA ARM;  Surgeon: Waynetta Sandy, MD;  Location: [facility];  Service: Vascular;  Laterality: Left;  . WISDOM TOOTH EXTRACTION      There were no vitals filed for this  visit.   Subjective Assessment - 05/10/20 0805    Subjective No new complaints. No falls or pain to report. Reports tremors "about the same".    Pertinent History CHF, HTN, ERSD    Limitations Standing;Walking;Writing;House hold activities;Lifting    How long can you sit comfortably? supported sitting no problem    How long can you stand comfortably? <5 min    How long can you walk comfortably? 20 feet with walker    Patient Stated Goals improve walking get stronger    Currently in Pain? No/denies    Pain Score 0-No pain                [facility] Adult PT Treatment/Exercise - 05/10/20 0806      Transfers   Transfers Sit to Stand;Stand to Sit    Sit to Stand 6: Modified independent (Device/Increase time)    Stand to Sit 6: Modified independent (Device/Increase time)      Ambulation/Gait   Ambulation/Gait Yes    Ambulation/Gait Assistance 4: Min guard    Ambulation/Gait Assistance Details able to return to use of straight cane this session due to decreased tremors today. cues for cane placement. pt needing increased time to initiate gait at times due to mild tremors/hesitation.     Ambulation Distance (Feet) 230 Feet   x1, plus around gym with session   Assistive device Straight cane   with rubber quad tip   Gait Pattern Step-through pattern;Step-to pattern;Decreased step length - right;Decreased step length - left;Decreased stride length;Decreased hip/knee flexion - right;Decreased hip/knee flexion - left;Poor foot clearance - left;Poor foot clearance - right    Ambulation Surface Level;Indoor    Stairs Yes    Stairs Assistance 4: Min guard    Stairs Assistance Details (indicate cue type and reason) practiced reaching left hand over to right rail to ascend final step so simulate his daughers home. also used this techique with both hands on right rail to descend the stairs as he reports sitting down to bump down the stairs vs using rails.     Stair Management Technique Two rails;One  rail Right;Step to pattern;Forwards;Sideways   partially sideways   Number of Stairs 4    Height of Stairs 6      High Level Balance   High Level Balance Activities Negotiating over obstacles    High Level Balance Comments with cane next to counter top: forward stepping over 3 bolsters x 6 laps with min guard assist, hand support on counter with increased time needed to initiate steppping.       Knee/Hip Exercises: Aerobic   Other Aerobic Scifit with UE/LE's level 5.0 x 8 minutes with goal >/= 40-50 rpm for endurance, strengthening and reciprocal movement.                PT Short Term Goals - 04/25/20 1012      PT SHORT TERM GOAL #1   Title Pt will ambulate x 500'  outside over pavement with quad cane and supervision    Time 4    Period Weeks    Status Revised    Target Date 05/25/20      PT SHORT TERM GOAL #2   Title Patient will be able to ascend and descend 8 stairs with step to sequence with one rail and cane with supervision    Time 4    Period Weeks    Status Revised    Target Date 05/25/20      PT SHORT TERM GOAL #3   Title Pt will increase BERG score to >/= 44/56    Baseline 41/56    Time 4    Period Weeks    Status New    Target Date 05/25/20      PT SHORT TERM GOAL #4   Title Pt will decrease TUG with cane to </= 18 seconds with supervision    Time 4    Period Weeks    Status New    Target Date 05/25/20             PT Long Term Goals - 04/25/20 1014      PT LONG TERM GOAL #1   Title Patient will be able to ambulate >1000 feet with quad cane and supervision to improve community ambulation    Baseline 800' with quad cane outside with frequent rest breaks and min-mod A when pt fatigues    Time 8    Period Weeks    Status Revised    Target Date 06/24/20      PT LONG TERM GOAL #2   Title Patient will be able to perform TUG <15 seconds with RW to improve overall functional mobility    Baseline 36 seconds with RW, 22 seconds with straight cane  (04/18/20)    Time 8    Period Weeks    Status Revised    Target Date 06/24/20      PT LONG TERM GOAL #3   Title Patient will be able to go up and down 17 steps with use of one rail and cane, step to sequence with Supervision    Baseline 16 stairs with bilat rails and min A    Time 8    Period Weeks    Status Revised    Target Date 06/24/20      PT LONG TERM GOAL #4   Title Patient will demo improvement on Berg balance scale to at least 48/56 to improve functional balance and reduce fall risk    Baseline 41/56    Time 8    Period Weeks    Status Revised    Target Date 06/24/20                 Plan - 05/10/20 0806    Clinical Impression Statement Today's skilled session continued to focus on gait with cane, stairt negotiation, strengthening and balance with cane support. Pt continues to have "freezing"/decreaed initiation at times with mild tremors today. This is most prevelent when starting a new task or task not done in awhile. The pt is progressing toward goals and should benefit from continued PT to progress toward unmet goals.    Personal Factors and Comorbidities Comorbidity 3+;Past/Current Experience;Time since onset of injury/illness/exacerbation;Transportation;Fitness;Age    Comorbidities CHF, ESRD, HTN    Examination-Activity Limitations Bathing;Bed Mobility;Caring for Others;Carry;Dressing;Hygiene/Grooming;Lift;Locomotion Level;Self Feeding;Sit;Squat;Stairs;Stand;Toileting;Transfers    Examination-Participation Restrictions Cleaning;Community Activity;Driving;Interpersonal Relationship;Laundry;Medication Management;Meal Prep;Shop;Yard Work    [employer] Evolving/Moderate complexity  Rehab Potential Good    PT Frequency 2x / week    PT Duration 8 weeks    PT Treatment/Interventions ADLs/Self Care Home Management;Gait training;Stair training;Functional mobility training;Therapeutic activities;Therapeutic exercise;Balance training;Neuromuscular  re-education;Patient/family education;Manual techniques;Passive range of motion;Joint Manipulations    PT Next Visit Plan Continue to work on gait outside with RW if weather allows, progress to gravel/grass as able.  Standing balance activities reducing UE support, SLS activities.  Stair negotiation decreasing UE support    PT [place]- updated on 04/12/20    Consulted and Agree with Plan of Care Patient           Patient will benefit from skilled therapeutic intervention in order to improve the following deficits and impairments:  Abnormal gait, Decreased activity tolerance, Decreased balance, Decreased mobility, Decreased endurance, Decreased coordination, Decreased range of motion, Decreased safety awareness, Decreased strength, Difficulty walking, Impaired flexibility, Postural dysfunction, Impaired UE functional use, Impaired tone  Visit Diagnosis: Unsteadiness on feet  Muscle weakness (generalized)  Other abnormalities of gait and mobility     Problem List Patient Active Problem List   Diagnosis Date Noted  . 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 Gait abnormality 08/12/2019  . History of gout 06/17/2019  . Bowel incontinence 06/09/2019  . Anoxic brain injury ([place])   . History of cardiac arrest 04/27/2019  . History of non-ST elevation myocardial infarction (NSTEMI) 04/18/2019  . Morbid obesity ([place]) 04/14/2019  . Diabetic neuropathy ([place]) 04/02/2016  . Hypertension associated with diabetes ([place]) 10/03/2015  . HLD (hyperlipidemia) 09/06/2015  . End stage renal disease on dialysis ([place]) 08/31/2015  . DM (diabetes mellitus), type 2 with renal complications ([place]) 49/20/1007  . OSA treated with BiPAP 12/12/2013  . Coronary atherosclerosis 11/01/2009    Willow Ora, PTA, [facility]patient Neuro [facility] [address], [place], [place] [address] [phone] 05/11/20, 12:00 PM   Name: Lerry Cordrey MRN: [id number] Date of Birth:  1965-11-12

## 2020-05-12 DIAGNOSIS — D631 Anemia in chronic kidney disease: Secondary | ICD-10-CM | POA: Diagnosis not present

## 2020-05-12 DIAGNOSIS — E1121 Type 2 diabetes mellitus with diabetic nephropathy: Secondary | ICD-10-CM | POA: Diagnosis not present

## 2020-05-12 DIAGNOSIS — Z992 Dependence on renal dialysis: Secondary | ICD-10-CM | POA: Diagnosis not present

## 2020-05-12 DIAGNOSIS — N186 End stage renal disease: Secondary | ICD-10-CM | POA: Diagnosis not present

## 2020-05-12 DIAGNOSIS — N2581 Secondary hyperparathyroidism of renal origin: Secondary | ICD-10-CM | POA: Diagnosis not present

## 2020-05-15 DIAGNOSIS — D631 Anemia in chronic kidney disease: Secondary | ICD-10-CM | POA: Diagnosis not present

## 2020-05-15 DIAGNOSIS — E1121 Type 2 diabetes mellitus with diabetic nephropathy: Secondary | ICD-10-CM | POA: Diagnosis not present

## 2020-05-15 DIAGNOSIS — N2581 Secondary hyperparathyroidism of renal origin: Secondary | ICD-10-CM | POA: Diagnosis not present

## 2020-05-15 DIAGNOSIS — N186 End stage renal disease: Secondary | ICD-10-CM | POA: Diagnosis not present

## 2020-05-15 DIAGNOSIS — Z992 Dependence on renal dialysis: Secondary | ICD-10-CM | POA: Diagnosis not present

## 2020-05-16 ENCOUNTER — Other Ambulatory Visit: Payer: Self-pay

## 2020-05-16 ENCOUNTER — Encounter: Payer: Self-pay | Admitting: Physical Therapy

## 2020-05-16 ENCOUNTER — Ambulatory Visit: Payer: Medicare Other | Admitting: Physical Therapy

## 2020-05-16 DIAGNOSIS — M6281 Muscle weakness (generalized): Secondary | ICD-10-CM

## 2020-05-16 DIAGNOSIS — R2689 Other abnormalities of gait and mobility: Secondary | ICD-10-CM

## 2020-05-16 DIAGNOSIS — R2681 Unsteadiness on feet: Secondary | ICD-10-CM | POA: Diagnosis not present

## 2020-05-16 NOTE — Therapy (Signed)
[facility] [address] [place], [place], [address] Phone: [phone]   Fax:  [phone]  Physical Therapy Treatment  Patient Details  Name: Marvin Grabill MRN: [id number] Date of Birth: 1965/08/19 Referring Provider (PT): Dr. Andrena Mews   Encounter Date: 05/16/2020   PT End of Session - 05/16/20 0807    Visit Number 37    Number of Visits 68    Date for PT Re-Evaluation 06/24/20    Authorization Type Medicare parts A&B; KX modifier required now    Progress Note Due on Visit 9    PT Start Time 0804    PT Stop Time 0844    PT Time Calculation (min) 40 min    Equipment Utilized During Treatment Gait belt    Activity Tolerance Patient tolerated treatment well    Behavior During Therapy [facility] for tasks assessed/performed           Past Medical History:  Diagnosis Date  . CARDIAC ARREST 11/01/2009   Qualifier: Diagnosis of  By: Selena Batten CMA, Jewel    . Coagulase negative Staphylococcus bacteremia 06/30/2019  . Colon polyps 01/02/2016   Colonoscopy July 2017 - One 3 mm polyp in the transverse colon, removed with a cold biopsy forceps. Resected and retrieved. - One 3 mm polyp in the rectum, removed with a cold biopsy forceps. Resected and retrieved. - Diverticulosis in the entire examined colon. - Non-bleeding internal hemorrhoids. - The examination was otherwise normal. - Significant looping which prolonged cecal    . Coronary artery disease 08/15/2009   with AMI  . Coronary atherosclerosis 11/01/2009   Qualifier: Diagnosis of  By: Selena Batten CMA, Jewel    . Dialysis patient ([place]) 09/27/2014   mon, wed, and fri  . Diverticulosis of colon without hemorrhage 01/02/2016   Colonoscopy July 2017 - One 3 mm polyp in the transverse colon, removed with a cold biopsy forceps. Resected and retrieved. - One 3 mm polyp in the rectum, removed with a cold biopsy forceps. Resected and retrieved. - Diverticulosis in the  entire examined colon. - Non-bleeding internal hemorrhoids. - The examination was otherwise normal. - Significant looping which prolonged cecal    . DM (diabetes mellitus), type 2 with renal complications ([place]) 88/41/6606  . Essential hypertension 11/01/2009   Qualifier: Diagnosis of  By: Selena Batten CMA, Jewel    . Gout 10/21/2018  . History of acute respiratory distress syndrome (ARDS) due to COVID-19 virus   . History of cardiac arrest 04/27/2019  . History of non-ST elevation myocardial infarction (NSTEMI) 04/18/2019  . history of respiratory arrest   . Hyperlipidemia   . Hypertension   . Mitral valve regurgitation 09/19/2015   Echo 09/2015   . 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 OSA treated with BiPAP 06/17/2010  . Protein-calorie malnutrition ([place]) 06/01/2019   Poor PO intake, only eating 50% of meals, could benefit from feeding tube if persistently poor PO intake.  . Pulmonary hypertension ([place]) 04/14/2019   RVSP 38 (TTE 2017) but repeat TTE March 2019 with RVSP 25 RAP in 2020: 36mmhg  . Sleep apnea    cpap nightly    Past Surgical History:  Procedure Laterality Date  . AV FISTULA PLACEMENT Left 09/27/2014  . INSERTION OF ARTERIOVENOUS (AV) ARTEGRAFT ARM Left 04/02/2018   Procedure: INSERTION OF ARTERIOVENOUS (AV) ARTEGRAFT ARM LEFT UPPER ARM;  Surgeon: Waynetta Sandy, MD;  Location: [facility];  Service: Vascular;  Laterality: Left;  . INSERTION OF DIALYSIS CATHETER N/A 04/02/2018  Procedure: INSERTION OF DIALYSIS CATHETER, right internal jugular;  Surgeon: Waynetta Sandy, MD;  Location: [facility];  Service: Vascular;  Laterality: N/A;  . NECK SURGERY     C6 & C7 replaced 30 yrs ago per pt  . REVISON OF ARTERIOVENOUS FISTULA Left 04/02/2018   Procedure: REVISION PLICATION OF ARTERIOVENOUS FISTULA ARM;  Surgeon: Waynetta Sandy, MD;  Location: [facility];  Service: Vascular;  Laterality: Left;  . WISDOM TOOTH EXTRACTION      There were no vitals filed for this  visit.   Subjective Assessment - 05/16/20 0805    Subjective No new complaints. No falls or pain to report. Was able to use rail to go/up down stairs this weekend vs bumping on bottom. Was also walking with RW all weekend at various family members homes.    Pertinent History CHF, HTN, ERSD    Limitations Standing;Walking;Writing;House hold activities;Lifting    How long can you sit comfortably? supported sitting no problem    How long can you stand comfortably? <5 min    How long can you walk comfortably? 20 feet with walker    Patient Stated Goals improve walking get stronger    Currently in Pain? No/denies    Pain Score 0-No pain                   [facility] Adult PT Treatment/Exercise - 05/16/20 0808      Transfers   Transfers Sit to Stand;Stand to Sit    Sit to Stand 6: Modified independent (Device/Increase time)    Stand to Sit 6: Modified independent (Device/Increase time)      Ambulation/Gait   Ambulation/Gait Yes    Ambulation/Gait Assistance 4: Min guard    Ambulation/Gait Assistance Details cues for posture with no tremors noted today. Pt does report legs feeling tired/weak after use of Scifit with 1st gait rep. Weakness was improved with remainder of gait in session after a seated rest break.     Ambulation Distance (Feet) 115 Feet   x1, 230 x1, plus around gym   Assistive device Straight cane   with rubber quad tip   Gait Pattern Step-through pattern;Step-to pattern;Decreased step length - right;Decreased step length - left;Decreased stride length;Decreased hip/knee flexion - right;Decreased hip/knee flexion - left;Poor foot clearance - left;Poor foot clearance - right    Ambulation Surface Level;Indoor      Knee/Hip Exercises: Aerobic   Other Aerobic Scifit with UE/LE's level 5.5 x 8 minutes with goal >/= 40-50 rpm for endurance, strengthening and reciprocal movement.               Balance Exercises - 05/16/20 0826      Balance Exercises: Standing    Standing Eyes Closed Wide (BOA);Foam/compliant surface;Narrow base of support (BOS);Head turns;Other reps (comment);30 secs;Limitations    Standing Eyes Closed Limitations on airex with intermittent touch to bars for balance assistance: feet together for EC 30 sec's x 3 reps, then feet hip width apart for EC head movements left<>right, then up<>down for ~10 reps each. up to min assist with LE tremors noted when moving foot position.     Partial Tandem Stance Eyes closed;Foam/compliant surface;Intermittent upper extremity support;3 reps;30 secs;Limitations    Partial Tandem Stance Limitations on airex with intermittent touch to bars for balance assistance: 3 reps each foot foreward for EC 30 sec's. up to min assist for balance with LE tremors noted when pt would move foot positions.  PT Short Term Goals - 04/25/20 1012      PT SHORT TERM GOAL #1   Title Pt will ambulate x 500' outside over pavement with quad cane and supervision    Time 4    Period Weeks    Status Revised    Target Date 05/25/20      PT SHORT TERM GOAL #2   Title Patient will be able to ascend and descend 8 stairs with step to sequence with one rail and cane with supervision    Time 4    Period Weeks    Status Revised    Target Date 05/25/20      PT SHORT TERM GOAL #3   Title Pt will increase BERG score to >/= 44/56    Baseline 41/56    Time 4    Period Weeks    Status New    Target Date 05/25/20      PT SHORT TERM GOAL #4   Title Pt will decrease TUG with cane to </= 18 seconds with supervision    Time 4    Period Weeks    Status New    Target Date 05/25/20             PT Long Term Goals - 04/25/20 1014      PT LONG TERM GOAL #1   Title Patient will be able to ambulate >1000 feet with quad cane and supervision to improve community ambulation    Baseline 800' with quad cane outside with frequent rest breaks and min-mod A when pt fatigues    Time 8    Period Weeks    Status Revised     Target Date 06/24/20      PT LONG TERM GOAL #2   Title Patient will be able to perform TUG <15 seconds with RW to improve overall functional mobility    Baseline 36 seconds with RW, 22 seconds with straight cane (04/18/20)    Time 8    Period Weeks    Status Revised    Target Date 06/24/20      PT LONG TERM GOAL #3   Title Patient will be able to go up and down 17 steps with use of one rail and cane, step to sequence with Supervision    Baseline 16 stairs with bilat rails and min A    Time 8    Period Weeks    Status Revised    Target Date 06/24/20      PT LONG TERM GOAL #4   Title Patient will demo improvement on Berg balance scale to at least 48/56 to improve functional balance and reduce fall risk    Baseline 41/56    Time 8    Period Weeks    Status Revised    Target Date 06/24/20                 Plan - 05/16/20 0807    Clinical Impression Statement Today's skilled session continued to focus on gait with cane, strengthening and balance training. Pt with tremors only with balance activities on airex with moving foot position. The pt is progressing toward goals and should benefit from continued PT to progress toward unmet goals.    Personal Factors and Comorbidities Comorbidity 3+;Past/Current Experience;Time since onset of injury/illness/exacerbation;Transportation;Fitness;Age    Comorbidities CHF, ESRD, HTN    Examination-Activity Limitations Bathing;Bed Mobility;Caring for Others;Carry;Dressing;Hygiene/Grooming;Lift;Locomotion Level;Self Feeding;Sit;Squat;Stairs;Stand;Toileting;Transfers    Examination-Participation Restrictions Cleaning;Community Activity;Driving;Interpersonal Relationship;Laundry;Medication Management;Meal Prep;Shop;Yard Work  Stability/Clinical Decision Making Evolving/Moderate complexity    Rehab Potential Good    PT Frequency 2x / week    PT Duration 8 weeks    PT Treatment/Interventions ADLs/Self Care Home Management;Gait training;Stair  training;Functional mobility training;Therapeutic activities;Therapeutic exercise;Balance training;Neuromuscular re-education;Patient/family education;Manual techniques;Passive range of motion;Joint Manipulations    PT Next Visit Plan Continue to work on gait outside with RW if weather allows, progress to gravel/grass as able.  Standing balance activities reducing UE support, SLS activities.  Stair negotiation decreasing UE support    PT [place]- updated on 04/12/20    Consulted and Agree with Plan of Care Patient           Patient will benefit from skilled therapeutic intervention in order to improve the following deficits and impairments:  Abnormal gait, Decreased activity tolerance, Decreased balance, Decreased mobility, Decreased endurance, Decreased coordination, Decreased range of motion, Decreased safety awareness, Decreased strength, Difficulty walking, Impaired flexibility, Postural dysfunction, Impaired UE functional use, Impaired tone  Visit Diagnosis: Unsteadiness on feet  Muscle weakness (generalized)  Other abnormalities of gait and mobility     Problem List Patient Active Problem List   Diagnosis Date Noted  . 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 Gait abnormality 08/12/2019  . History of gout 06/17/2019  . Bowel incontinence 06/09/2019  . Anoxic brain injury ([place])   . History of cardiac arrest 04/27/2019  . History of non-ST elevation myocardial infarction (NSTEMI) 04/18/2019  . Morbid obesity ([place]) 04/14/2019  . Diabetic neuropathy ([place]) 04/02/2016  . Hypertension associated with diabetes ([place]) 10/03/2015  . HLD (hyperlipidemia) 09/06/2015  . End stage renal disease on dialysis ([place]) 08/31/2015  . DM (diabetes mellitus), type 2 with renal complications ([place]) 03/88/8280  . OSA treated with BiPAP 12/12/2013  . Coronary atherosclerosis 11/01/2009    Willow Ora, PTA, [facility] [address], [place], [place] [address] [phone] 05/16/20, 9:07 AM   Name: Dequavion Follette MRN: [id number] Date of Birth: 04/05/1966

## 2020-05-17 DIAGNOSIS — Z992 Dependence on renal dialysis: Secondary | ICD-10-CM | POA: Diagnosis not present

## 2020-05-17 DIAGNOSIS — E1122 Type 2 diabetes mellitus with diabetic chronic kidney disease: Secondary | ICD-10-CM | POA: Diagnosis not present

## 2020-05-17 DIAGNOSIS — D631 Anemia in chronic kidney disease: Secondary | ICD-10-CM | POA: Diagnosis not present

## 2020-05-17 DIAGNOSIS — N186 End stage renal disease: Secondary | ICD-10-CM | POA: Diagnosis not present

## 2020-05-17 DIAGNOSIS — N2581 Secondary hyperparathyroidism of renal origin: Secondary | ICD-10-CM | POA: Diagnosis not present

## 2020-05-17 DIAGNOSIS — R2681 Unsteadiness on feet: Secondary | ICD-10-CM | POA: Diagnosis not present

## 2020-05-17 DIAGNOSIS — E1121 Type 2 diabetes mellitus with diabetic nephropathy: Secondary | ICD-10-CM | POA: Diagnosis not present

## 2020-05-17 DIAGNOSIS — R2689 Other abnormalities of gait and mobility: Secondary | ICD-10-CM | POA: Diagnosis not present

## 2020-05-17 DIAGNOSIS — Z23 Encounter for immunization: Secondary | ICD-10-CM | POA: Diagnosis not present

## 2020-05-17 DIAGNOSIS — M6281 Muscle weakness (generalized): Secondary | ICD-10-CM | POA: Diagnosis not present

## 2020-05-18 ENCOUNTER — Ambulatory Visit: Payer: Medicare Other | Attending: Family Medicine | Admitting: Physical Therapy

## 2020-05-18 ENCOUNTER — Other Ambulatory Visit: Payer: Self-pay

## 2020-05-18 DIAGNOSIS — M6281 Muscle weakness (generalized): Secondary | ICD-10-CM | POA: Insufficient documentation

## 2020-05-18 DIAGNOSIS — R2681 Unsteadiness on feet: Secondary | ICD-10-CM | POA: Insufficient documentation

## 2020-05-18 DIAGNOSIS — R2689 Other abnormalities of gait and mobility: Secondary | ICD-10-CM | POA: Diagnosis not present

## 2020-05-18 NOTE — Therapy (Signed)
[facility] [address] [place], [place], [address] Phone: [phone]   Fax:  [phone]  Physical Therapy Treatment  Patient Details  Name: Troy Wells MRN: [id number] Date of Birth: 21-Dec-1965 Referring Provider (PT): Dr. Andrena Mews   Encounter Date: 05/18/2020   PT End of Session - 05/18/20 1430    Visit Number 38    Number of Visits 66    Date for PT Re-Evaluation 06/24/20    Authorization Type Medicare parts A&B; KX modifier required now    Progress Note Due on Visit 12    PT Start Time 0805    PT Stop Time 0845    PT Time Calculation (min) 40 min    Activity Tolerance Patient tolerated treatment well    Behavior During Therapy [facility] for tasks assessed/performed           Past Medical History:  Diagnosis Date  . CARDIAC ARREST 11/01/2009   Qualifier: Diagnosis of  By: Selena Batten CMA, [place]    . Coagulase negative Staphylococcus bacteremia 06/30/2019  . Colon polyps 01/02/2016   Colonoscopy July 2017 - One 3 mm polyp in the transverse colon, removed with a cold biopsy forceps. Resected and retrieved. - One 3 mm polyp in the rectum, removed with a cold biopsy forceps. Resected and retrieved. - Diverticulosis in the entire examined colon. - Non-bleeding internal hemorrhoids. - The examination was otherwise normal. - Significant looping which prolonged cecal    . Coronary artery disease 08/15/2009   with AMI  . Coronary atherosclerosis 11/01/2009   Qualifier: Diagnosis of  By: Selena Batten CMA, Jewel    . Dialysis patient ([place]) 09/27/2014   mon, wed, and fri  . Diverticulosis of colon without hemorrhage 01/02/2016   Colonoscopy July 2017 - One 3 mm polyp in the transverse colon, removed with a cold biopsy forceps. Resected and retrieved. - One 3 mm polyp in the rectum, removed with a cold biopsy forceps. Resected and retrieved. - Diverticulosis in the entire examined colon. - Non-bleeding internal  hemorrhoids. - The examination was otherwise normal. - Significant looping which prolonged cecal    . DM (diabetes mellitus), type 2 with renal complications ([place]) 61/95/0932  . Essential hypertension 11/01/2009   Qualifier: Diagnosis of  By: Selena Batten CMA, Jewel    . Gout 10/21/2018  . History of acute respiratory distress syndrome (ARDS) due to COVID-19 virus   . History of cardiac arrest 04/27/2019  . History of non-ST elevation myocardial infarction (NSTEMI) 04/18/2019  . history of respiratory arrest   . Hyperlipidemia   . Hypertension   . Mitral valve regurgitation 09/19/2015   Echo 09/2015   . 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 OSA treated with BiPAP 06/17/2010  . Protein-calorie malnutrition ([place]) 06/01/2019   Poor PO intake, only eating 50% of meals, could benefit from feeding tube if persistently poor PO intake.  . Pulmonary hypertension ([place]) 04/14/2019   RVSP 38 (TTE 2017) but repeat TTE March 2019 with RVSP 25 RAP in 2020: 64mmhg  . Sleep apnea    cpap nightly    Past Surgical History:  Procedure Laterality Date  . AV FISTULA PLACEMENT Left 09/27/2014  . INSERTION OF ARTERIOVENOUS (AV) ARTEGRAFT ARM Left 04/02/2018   Procedure: INSERTION OF ARTERIOVENOUS (AV) ARTEGRAFT ARM LEFT UPPER ARM;  Surgeon: Waynetta Sandy, MD;  Location: [facility];  Service: Vascular;  Laterality: Left;  . INSERTION OF DIALYSIS CATHETER N/A 04/02/2018   Procedure: INSERTION OF DIALYSIS CATHETER, right internal jugular;  Surgeon: Waynetta Sandy, MD;  Location: [employer];  Service: Vascular;  Laterality: N/A;  . NECK SURGERY     C6 & C7 replaced 30 yrs ago per pt  . REVISON OF ARTERIOVENOUS FISTULA Left 04/02/2018   Procedure: REVISION PLICATION OF ARTERIOVENOUS FISTULA ARM;  Surgeon: Waynetta Sandy, MD;  Location: [facility];  Service: Vascular;  Laterality: Left;  . WISDOM TOOTH EXTRACTION      There were no vitals filed for this visit.   Subjective Assessment - 05/18/20 1427     Subjective Nothing new to report; medication continues to improve tremors.  Had to get himself on the bus today; daughter not able to assist due to she is about to give birth to her third child.    Pertinent History CHF, HTN, ERSD    Limitations Standing;Walking;Writing;House hold activities;Lifting    How long can you sit comfortably? supported sitting no problem    How long can you stand comfortably? <5 min    How long can you walk comfortably? 20 feet with walker    Patient Stated Goals improve walking get stronger    Currently in Pain? No/denies                             [facility] Adult PT Treatment/Exercise - 05/18/20 0809      Transfers   Transfers Sit to Stand;Stand to Sit;Stand Pivot Transfers    Sit to Stand 6: Modified independent (Device/Increase time)    Stand to Sit 6: Modified independent (Device/Increase time)    Stand Pivot Transfers 6: Modified independent (Device/Increase time)   with one UE support   Stand Pivot Transfer Details (indicate cue type and reason) Stand pivot w/c <> SciFit with one UE support on SciFit; no tremors      Ambulation/Gait   Ambulation/Gait Yes    Ambulation/Gait Assistance 4: Min guard;5: Supervision    Ambulation/Gait Assistance Details started with RW outside over pavement, inclines and curb with supervision with more upright posture and step length bilaterally.  Switched to the cane for 250' with pt able to maintain increased step/stride length 50% of the time.  Needed frequent standing rest breaks due to tremors; pt demonstrated increased trunk flexion and downward gaze with cane    Ambulation Distance (Feet) 800 Feet    Assistive device Rolling walker;Straight cane    Gait Pattern Step-through pattern;Decreased step length - right;Decreased step length - left;Decreased stride length;Poor foot clearance - left;Poor foot clearance - right;Trunk flexed    Ambulation Surface Unlevel;Outdoor;Paved    Curb 5: Supervision    Curb  Details (indicate cue type and reason) with RW outside on pavement      Knee/Hip Exercises: Aerobic   Other Aerobic Scifit with UE/LE's level 5.5 x 8 minutes for endurance, strengthening and reciprocal movement.       Knee/Hip Exercises: Standing   Forward Step Up Right;Left;1 set;5 reps;Hand Hold: 2;Step Height: 4"    Forward Step Up Limitations with cane in one hand and // bar in other hand, min A for safety due to decreased confidence during SLS when advancing contralateral LE    Step Down Right;Left;1 set;5 reps;Hand Hold: 2;Step Height: 4"    Step Down Limitations backwards with cane in one hand and // bar in other.                    PT Short Term Goals - 04/25/20 1012  PT SHORT TERM GOAL #1   Title Pt will ambulate x 500' outside over pavement with quad cane and supervision    Time 4    Period Weeks    Status Revised    Target Date 05/25/20      PT SHORT TERM GOAL #2   Title Patient will be able to ascend and descend 8 stairs with step to sequence with one rail and cane with supervision    Time 4    Period Weeks    Status Revised    Target Date 05/25/20      PT SHORT TERM GOAL #3   Title Pt will increase BERG score to >/= 44/56    Baseline 41/56    Time 4    Period Weeks    Status New    Target Date 05/25/20      PT SHORT TERM GOAL #4   Title Pt will decrease TUG with cane to </= 18 seconds with supervision    Time 4    Period Weeks    Status New    Target Date 05/25/20             PT Long Term Goals - 04/25/20 1014      PT LONG TERM GOAL #1   Title Patient will be able to ambulate >1000 feet with quad cane and supervision to improve community ambulation    Baseline 800' with quad cane outside with frequent rest breaks and min-mod A when pt fatigues    Time 8    Period Weeks    Status Revised    Target Date 06/24/20      PT LONG TERM GOAL #2   Title Patient will be able to perform TUG <15 seconds with RW to improve overall functional  mobility    Baseline 36 seconds with RW, 22 seconds with straight cane (04/18/20)    Time 8    Period Weeks    Status Revised    Target Date 06/24/20      PT LONG TERM GOAL #3   Title Patient will be able to go up and down 17 steps with use of one rail and cane, step to sequence with Supervision    Baseline 16 stairs with bilat rails and min A    Time 8    Period Weeks    Status Revised    Target Date 06/24/20      PT LONG TERM GOAL #4   Title Patient will demo improvement on Berg balance scale to at least 48/56 to improve functional balance and reduce fall risk    Baseline 41/56    Time 8    Period Weeks    Status Revised    Target Date 06/24/20                 Plan - 05/18/20 1430    Clinical Impression Statement Continued to focus on endurance training and gait training over outdoor surfaces with RW and cane.  Pt able to perform curb with RW with close supervision safely; still requires significant assistance or bilat UE support when attempting to perform a step up with the cane.  With cane outdoors pt continues to fatigue quickly and experience increased tremors.  PT to continue to address and continue to progress towards greater independence with LRAD and decreased reliance on w/c.    Personal Factors and Comorbidities Comorbidity 3+;Past/Current Experience;Time since onset of injury/illness/exacerbation;Transportation;Fitness;Age    Comorbidities CHF, ESRD, HTN  Examination-Activity Limitations Bathing;Bed Mobility;Caring for Others;Carry;Dressing;Hygiene/Grooming;Lift;Locomotion Level;Self Feeding;Sit;Squat;Stairs;Stand;Toileting;Transfers    Examination-Participation Restrictions Cleaning;Community Activity;Driving;Interpersonal Relationship;Laundry;Medication Management;Meal Prep;Shop;Yard Work    Stability/Clinical Decision Making Evolving/Moderate complexity    Rehab Potential Good    PT Frequency 2x / week    PT Duration 8 weeks    PT Treatment/Interventions  ADLs/Self Care Home Management;Gait training;Stair training;Functional mobility training;Therapeutic activities;Therapeutic exercise;Balance training;Neuromuscular re-education;Patient/family education;Manual techniques;Passive range of motion;Joint Manipulations    PT Next Visit Plan CHECK STG and then 10th visit PN.  Continue to work on gait outside with RW if weather allows, progress to gravel/grass as able.  Standing balance activities reducing UE support, SLS activities.  Stair and curb negotiation decreasing UE support (cane).  Working towards less w/c use.    PT [place]- updated on 04/12/20    Consulted and Agree with Plan of Care Patient           Patient will benefit from skilled therapeutic intervention in order to improve the following deficits and impairments:  Abnormal gait, Decreased activity tolerance, Decreased balance, Decreased mobility, Decreased endurance, Decreased coordination, Decreased range of motion, Decreased safety awareness, Decreased strength, Difficulty walking, Impaired flexibility, Postural dysfunction, Impaired UE functional use, Impaired tone  Visit Diagnosis: Unsteadiness on feet  Muscle weakness (generalized)  Other abnormalities of gait and mobility     Problem List Patient Active Problem List   Diagnosis Date Noted  . 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 Gait abnormality 08/12/2019  . History of gout 06/17/2019  . Bowel incontinence 06/09/2019  . Anoxic brain injury ([place])   . History of cardiac arrest 04/27/2019  . History of non-ST elevation myocardial infarction (NSTEMI) 04/18/2019  . Morbid obesity ([place]) 04/14/2019  . Diabetic neuropathy ([place]) 04/02/2016  . Hypertension associated with diabetes ([place]) 10/03/2015  . HLD (hyperlipidemia) 09/06/2015  . End stage renal disease on dialysis ([place]) 08/31/2015  . DM (diabetes mellitus), type 2 with renal complications ([place]) 40/98/1191  . OSA treated with BiPAP 12/12/2013    . Coronary atherosclerosis 11/01/2009    Rico Junker, PT, DPT 05/18/20    2:34 PM    [facility] [address] [place], [place], [address] Phone: [phone]   Fax:  [phone]  Name: Troy Wells MRN: [id number] Date of Birth: Dec 27, 1965

## 2020-05-19 DIAGNOSIS — N2581 Secondary hyperparathyroidism of renal origin: Secondary | ICD-10-CM | POA: Diagnosis not present

## 2020-05-19 DIAGNOSIS — Z992 Dependence on renal dialysis: Secondary | ICD-10-CM | POA: Diagnosis not present

## 2020-05-19 DIAGNOSIS — Z23 Encounter for immunization: Secondary | ICD-10-CM | POA: Diagnosis not present

## 2020-05-19 DIAGNOSIS — N186 End stage renal disease: Secondary | ICD-10-CM | POA: Diagnosis not present

## 2020-05-19 DIAGNOSIS — D631 Anemia in chronic kidney disease: Secondary | ICD-10-CM | POA: Diagnosis not present

## 2020-05-19 DIAGNOSIS — E1121 Type 2 diabetes mellitus with diabetic nephropathy: Secondary | ICD-10-CM | POA: Diagnosis not present

## 2020-05-22 DIAGNOSIS — N2581 Secondary hyperparathyroidism of renal origin: Secondary | ICD-10-CM | POA: Diagnosis not present

## 2020-05-22 DIAGNOSIS — N186 End stage renal disease: Secondary | ICD-10-CM | POA: Diagnosis not present

## 2020-05-22 DIAGNOSIS — E1121 Type 2 diabetes mellitus with diabetic nephropathy: Secondary | ICD-10-CM | POA: Diagnosis not present

## 2020-05-22 DIAGNOSIS — D631 Anemia in chronic kidney disease: Secondary | ICD-10-CM | POA: Diagnosis not present

## 2020-05-22 DIAGNOSIS — Z23 Encounter for immunization: Secondary | ICD-10-CM | POA: Diagnosis not present

## 2020-05-22 DIAGNOSIS — Z992 Dependence on renal dialysis: Secondary | ICD-10-CM | POA: Diagnosis not present

## 2020-05-23 ENCOUNTER — Encounter: Payer: Self-pay | Admitting: Physical Therapy

## 2020-05-23 ENCOUNTER — Ambulatory Visit: Payer: Medicare Other | Admitting: Physical Therapy

## 2020-05-23 ENCOUNTER — Other Ambulatory Visit: Payer: Self-pay

## 2020-05-23 DIAGNOSIS — R2681 Unsteadiness on feet: Secondary | ICD-10-CM

## 2020-05-23 DIAGNOSIS — M6281 Muscle weakness (generalized): Secondary | ICD-10-CM

## 2020-05-23 DIAGNOSIS — R2689 Other abnormalities of gait and mobility: Secondary | ICD-10-CM

## 2020-05-23 NOTE — Therapy (Signed)
[facility] [address] [place], [place], [address] Phone: [phone]   Fax:  [phone]  Physical Therapy Treatment  Patient Details  Name: Troy Wells MRN: [id number] Date of Birth: [birth year] Referring Provider (PT): Dr. Andrena Mews   Encounter Date: 05/23/2020   PT End of Session - 05/23/20 0808    Visit Number 39    Number of Visits 48    Date for PT Re-Evaluation 06/24/20    Authorization Type Medicare parts A&B; KX modifier required now    Progress Note Due on Visit 40    PT Start Time 0804    PT Stop Time 0842    PT Time Calculation (min) 38 min    Equipment Utilized During Treatment Gait belt    Activity Tolerance Patient tolerated treatment well;Other (comment)   limited by increased LE tremors with gait after Berg Balance Test   Behavior During Therapy [facility] for tasks assessed/performed           Past Medical History:  Diagnosis Date  . CARDIAC ARREST 11/01/2009   Qualifier: Diagnosis of  By: Selena Batten CMA, [place]    . Coagulase negative Staphylococcus bacteremia 06/30/2019  . Colon polyps 01/02/2016   Colonoscopy July 2017 - One 3 mm polyp in the transverse colon, removed with a cold biopsy forceps. Resected and retrieved. - One 3 mm polyp in the rectum, removed with a cold biopsy forceps. Resected and retrieved. - Diverticulosis in the entire examined colon. - Non-bleeding internal hemorrhoids. - The examination was otherwise normal. - Significant looping which prolonged cecal    . Coronary artery disease 08/15/2009   with AMI  . Coronary atherosclerosis 11/01/2009   Qualifier: Diagnosis of  By: Selena Batten CMA, Jewel    . Dialysis patient () 09/27/2014   mon, wed, and fri  . Diverticulosis of colon without hemorrhage 01/02/2016   Colonoscopy July 2017 - One 3 mm polyp in the transverse colon, removed with a cold biopsy forceps. Resected and retrieved. - One 3 mm polyp in the rectum,  removed with a cold biopsy forceps. Resected and retrieved. - Diverticulosis in the entire examined colon. - Non-bleeding internal hemorrhoids. - The examination was otherwise normal. - Significant looping which prolonged cecal    . DM (diabetes mellitus), type 2 with renal complications ([place]) 53/66/4403  . Essential hypertension 11/01/2009   Qualifier: Diagnosis of  By: Selena Batten CMA, Jewel    . Gout 10/21/2018  . History of acute respiratory distress syndrome (ARDS) due to COVID-19 virus   . History of cardiac arrest 04/27/2019  . History of non-ST elevation myocardial infarction (NSTEMI) 04/18/2019  . history of respiratory arrest   . Hyperlipidemia   . Hypertension   . Mitral valve regurgitation 09/19/2015   Echo 09/2015   . 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 OSA treated with BiPAP 06/17/2010  . Protein-calorie malnutrition ([place]) 06/01/2019   Poor PO intake, only eating 50% of meals, could benefit from feeding tube if persistently poor PO intake.  . Pulmonary hypertension ([place]) 04/14/2019   RVSP 38 (TTE 2017) but repeat TTE March 2019 with RVSP 25 RAP in 2020: 42mhg  . Sleep apnea    cpap nightly    Past Surgical History:  Procedure Laterality Date  . AV FISTULA PLACEMENT Left 09/27/2014  . INSERTION OF ARTERIOVENOUS (AV) ARTEGRAFT ARM Left 04/02/2018   Procedure: INSERTION OF ARTERIOVENOUS (AV) ARTEGRAFT ARM LEFT UPPER ARM;  Surgeon: CWaynetta Sandy MD;  Location: [facility]  Service: Vascular;  Laterality: Left;  . INSERTION OF DIALYSIS CATHETER N/A 04/02/2018   Procedure: INSERTION OF DIALYSIS CATHETER, right internal jugular;  Surgeon: Waynetta Sandy, MD;  Location: [facility];  Service: Vascular;  Laterality: N/A;  . NECK SURGERY     C6 & C7 replaced 30 yrs ago per pt  . REVISON OF ARTERIOVENOUS FISTULA Left 04/02/2018   Procedure: REVISION PLICATION OF ARTERIOVENOUS FISTULA ARM;  Surgeon: Waynetta Sandy, MD;  Location: [facility];  Service: Vascular;  Laterality:  Left;  . WISDOM TOOTH EXTRACTION      There were no vitals filed for this visit.   Subjective Assessment - 05/23/20 0806    Subjective No new complaints. No falls or pain to report. Daughter had her baby over weekend, coming home today. Only help he his having right now is someone taking the chair out for him to catch bus. Getitng himself ready, along with breakfast prior to this.    Pertinent History CHF, HTN, ERSD    Limitations Standing;Walking;Writing;House hold activities;Lifting    How long can you sit comfortably? supported sitting no problem    How long can you stand comfortably? <5 min    How long can you walk comfortably? 20 feet with walker    Patient Stated Goals improve walking get stronger    Currently in Pain? No/denies    Pain Score 0-No pain              [facility] PT Assessment - 05/23/20 0811      Berg Balance Test   Sit to Stand Able to stand without using hands and stabilize independently    Standing Unsupported Able to stand safely 2 minutes    Sitting with Back Unsupported but Feet Supported on Floor or Stool Able to sit safely and securely 2 minutes    Stand to Sit Sits safely with minimal use of hands    Transfers Able to transfer safely, minor use of hands    Standing Unsupported with Eyes Closed Able to stand 10 seconds safely    Standing Unsupported with Feet Together Able to place feet together independently and stand 1 minute safely    From Standing, Reach Forward with Outstretched Arm Can reach confidently >25 cm (10")    From Standing Position, Pick up Object from Floor Able to pick up shoe safely and easily    From Standing Position, Turn to Look Behind Over each Shoulder Looks behind from both sides and weight shifts well    Turn 360 Degrees Able to turn 360 degrees safely but slowly    Standing Unsupported, Alternately Place Feet on Step/Stool Needs assistance to keep from falling or unable to try   decreased ability to initiate foot taps with single  HHA   Standing Unsupported, One Foot in Front Able to take small step independently and hold 30 seconds    Standing on One Leg Unable to try or needs assist to prevent fall    Total Score 44    Berg comment: 44/56= significant risk for falls      Timed Up and Go Test   TUG Normal TUG    Normal TUG (seconds) 23.03   with cane with rubber quad tip                [facility] Adult PT Treatment/Exercise - 05/23/20 0811      Transfers   Transfers Sit to Stand;Stand to Sit;Stand Pivot Transfers    Sit to Stand 6: Modified independent (Device/Increase  time)    Stand to Sit 6: Modified independent (Device/Increase time)    Stand Pivot Transfers 6: Modified independent (Device/Increase time)      Ambulation/Gait   Ambulation/Gait Yes    Ambulation/Gait Assistance 4: Min guard    Ambulation/Gait Assistance Details use of cane with testing. attempted gait on track in inceased onset of tremors. headed to [place] instead for distance of ~20 feet with contralateral HHA.  Then back to wheelchair after use of [place]. Several pauses needed both ways due to LE tremors.     Ambulation Distance (Feet) 20 Feet   x1, plus with TUG   Assistive device Straight cane   with rubber quad tip   Gait Pattern Step-through pattern;Decreased step length - right;Decreased step length - left;Decreased stride length;Poor foot clearance - left;Poor foot clearance - right;Trunk flexed    Ambulation Surface Level;Indoor      Knee/Hip Exercises: Aerobic   Other Aerobic [place] with UE/LE's level 5.5 with goal >/=50 rpm x 8 minutes for activity tolerance, strengthening and reciprocal movements.                     PT Short Term Goals - 05/23/20 0810      PT SHORT TERM GOAL #1   Title Pt will ambulate x 500' outside over pavement with quad cane and supervision    Time 4    Period Weeks    Status On-going    Target Date 05/25/20      PT SHORT TERM GOAL #2   Title Patient will be able to ascend and descend 8  stairs with step to sequence with one rail and cane with supervision    Time 4    Period Weeks    Status On-going    Target Date 05/25/20      PT SHORT TERM GOAL #3   Title Pt will increase BERG score to >/= 44/56    Baseline 05/23/20: 44/56 scored today, met with session today    Time --    Period --    Status Achieved    Target Date 05/25/20      PT SHORT TERM GOAL #4   Title Pt will decrease TUG with cane to </= 18 seconds with supervision    Baseline 05/23/20: 23.03 sec's with cane    Time --    Period --    Status Not Met    Target Date 05/25/20             PT Long Term Goals - 04/25/20 1014      PT LONG TERM GOAL #1   Title Patient will be able to ambulate >1000 feet with quad cane and supervision to improve community ambulation    Baseline 800' with quad cane outside with frequent rest breaks and min-mod A when pt fatigues    Time 8    Period Weeks    Status Revised    Target Date 06/24/20      PT LONG TERM GOAL #2   Title Patient will be able to perform TUG <15 seconds with RW to improve overall functional mobility    Baseline 36 seconds with RW, 22 seconds with straight cane (04/18/20)    Time 8    Period Weeks    Status Revised    Target Date 06/24/20      PT LONG TERM GOAL #3   Title Patient will be able to go up and down 17 steps with use of  one rail and cane, step to sequence with Supervision    Baseline 16 stairs with bilat rails and min A    Time 8    Period Weeks    Status Revised    Target Date 06/24/20      PT LONG TERM GOAL #4   Title Patient will demo improvement on Berg balance scale to at least 48/56 to improve functional balance and reduce fall risk    Baseline 41/56    Time 8    Period Weeks    Status Revised    Target Date 06/24/20                 Plan - 05/23/20 0808    Clinical Impression Statement Today's skilled session focused on progress toward STGs. Pt did show a 3 point improvement on the [employer]. Remainder  of goal check today was limited by onset of increased tremors after performance of Berg Balance test. Focused on LE strengthening and reciprocal movements on [place] with limited improvement noted with gait afterwards. Will plan to check remaining goals at next session.    Personal Factors and Comorbidities Comorbidity 3+;Past/Current Experience;Time since onset of injury/illness/exacerbation;Transportation;Fitness;Age    Comorbidities CHF, ESRD, HTN    Examination-Activity Limitations Bathing;Bed Mobility;Caring for Others;Carry;Dressing;Hygiene/Grooming;Lift;Locomotion Level;Self Feeding;Sit;Squat;Stairs;Stand;Toileting;Transfers    Examination-Participation Restrictions Cleaning;Community Activity;Driving;Interpersonal Relationship;Laundry;Medication Management;Meal Prep;Shop;Yard Work    Stability/Clinical Decision Making Evolving/Moderate complexity    Rehab Potential Good    PT Frequency 2x / week    PT Duration 8 weeks    PT Treatment/Interventions ADLs/Self Care Home Management;Gait training;Stair training;Functional mobility training;Therapeutic activities;Therapeutic exercise;Balance training;Neuromuscular re-education;Patient/family education;Manual techniques;Passive range of motion;Joint Manipulations    PT Next Visit Plan check remaining STGs (would re-do TUG as his tremors limited it on Tuesday) and do 10th visit progress note. Continue to work on gait outside with RW if weather allows, progress to gravel/grass as able.  Standing balance activities reducing UE support, SLS activities.  Stair and curb negotiation decreasing UE support (cane).  Working towards less w/c use.    PT [place]- updated on 04/12/20    Consulted and Agree with Plan of Care Patient           Patient will benefit from skilled therapeutic intervention in order to improve the following deficits and impairments:  Abnormal gait, Decreased activity tolerance, Decreased balance, Decreased  mobility, Decreased endurance, Decreased coordination, Decreased range of motion, Decreased safety awareness, Decreased strength, Difficulty walking, Impaired flexibility, Postural dysfunction, Impaired UE functional use, Impaired tone  Visit Diagnosis: Unsteadiness on feet  Muscle weakness (generalized)  Other abnormalities of gait and mobility     Problem List Patient Active Problem List   Diagnosis Date Noted  . 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 Gait abnormality 08/12/2019  . History of gout 06/17/2019  . Bowel incontinence 06/09/2019  . Anoxic brain injury ([place])   . History of cardiac arrest 04/27/2019  . History of non-ST elevation myocardial infarction (NSTEMI) 04/18/2019  . Morbid obesity ([place]) 04/14/2019  . Diabetic neuropathy ([place]) 04/02/2016  . Hypertension associated with diabetes ([place]) 10/03/2015  . HLD (hyperlipidemia) 09/06/2015  . End stage renal disease on dialysis ([place]) 08/31/2015  . DM (diabetes mellitus), type 2 with renal complications ([place]) 01/77/9390  . OSA treated with BiPAP 12/12/2013  . Coronary atherosclerosis 11/01/2009    Willow Ora, PTA, [facility] [address] [place], [place] [address] [phone] 05/23/20, 10:06 AM   Name: Troy  Wells MRN: [id number] Date of Birth: August 17, 1965

## 2020-05-24 DIAGNOSIS — D631 Anemia in chronic kidney disease: Secondary | ICD-10-CM | POA: Diagnosis not present

## 2020-05-24 DIAGNOSIS — Z992 Dependence on renal dialysis: Secondary | ICD-10-CM | POA: Diagnosis not present

## 2020-05-24 DIAGNOSIS — Z23 Encounter for immunization: Secondary | ICD-10-CM | POA: Diagnosis not present

## 2020-05-24 DIAGNOSIS — N2581 Secondary hyperparathyroidism of renal origin: Secondary | ICD-10-CM | POA: Diagnosis not present

## 2020-05-24 DIAGNOSIS — N186 End stage renal disease: Secondary | ICD-10-CM | POA: Diagnosis not present

## 2020-05-24 DIAGNOSIS — E1121 Type 2 diabetes mellitus with diabetic nephropathy: Secondary | ICD-10-CM | POA: Diagnosis not present

## 2020-05-25 ENCOUNTER — Ambulatory Visit: Payer: Medicare Other | Admitting: Physical Therapy

## 2020-05-25 ENCOUNTER — Encounter: Payer: Self-pay | Admitting: Physical Therapy

## 2020-05-25 ENCOUNTER — Other Ambulatory Visit: Payer: Self-pay

## 2020-05-25 VITALS — BP 152/84 | HR 112

## 2020-05-25 DIAGNOSIS — M6281 Muscle weakness (generalized): Secondary | ICD-10-CM

## 2020-05-25 DIAGNOSIS — R2689 Other abnormalities of gait and mobility: Secondary | ICD-10-CM

## 2020-05-25 DIAGNOSIS — R2681 Unsteadiness on feet: Secondary | ICD-10-CM

## 2020-05-25 NOTE — Therapy (Signed)
[facility] [address] [place], [place], [address] Phone: [phone]   Fax:  [phone]  Physical Therapy Treatment and 10th Visit PN  Patient Details  Name: Troy Wells MRN: [id number] Date of Birth: 1965/09/29 Referring Provider (PT): Dr. Andrena Mews   Encounter Date: 05/25/2020   Progress Note Reporting Period 04/18/2020 to 05/25/2020  See note below for Objective Data and Assessment of Progress/Goals.     PT End of Session - 05/25/20 0927    Visit Number 40    Number of Visits 48    Date for PT Re-Evaluation 06/24/20    Authorization Type Medicare parts A&B; KX modifier required now    Progress Note Due on Visit 20    PT Start Time 0802    PT Stop Time 0848    PT Time Calculation (min) 46 min    Activity Tolerance Patient tolerated treatment well    Behavior During Therapy [facility] for tasks assessed/performed           Past Medical History:  Diagnosis Date  . CARDIAC ARREST 11/01/2009   Qualifier: Diagnosis of  By: Selena Batten CMA, Jewel    . Coagulase negative Staphylococcus bacteremia 06/30/2019  . Colon polyps 01/02/2016   Colonoscopy July 2017 - One 3 mm polyp in the transverse colon, removed with a cold biopsy forceps. Resected and retrieved. - One 3 mm polyp in the rectum, removed with a cold biopsy forceps. Resected and retrieved. - Diverticulosis in the entire examined colon. - Non-bleeding internal hemorrhoids. - The examination was otherwise normal. - Significant looping which prolonged cecal    . Coronary artery disease 08/15/2009   with AMI  . Coronary atherosclerosis 11/01/2009   Qualifier: Diagnosis of  By: Selena Batten CMA, Jewel    . Dialysis patient ([place]) 09/27/2014   mon, wed, and fri  . Diverticulosis of colon without hemorrhage 01/02/2016   Colonoscopy July 2017 - One 3 mm polyp in the transverse colon, removed with a cold biopsy forceps. Resected and retrieved. - One 3 mm  polyp in the rectum, removed with a cold biopsy forceps. Resected and retrieved. - Diverticulosis in the entire examined colon. - Non-bleeding internal hemorrhoids. - The examination was otherwise normal. - Significant looping which prolonged cecal    . DM (diabetes mellitus), type 2 with renal complications ([place]) 32/99/2426  . Essential hypertension 11/01/2009   Qualifier: Diagnosis of  By: Selena Batten CMA, Jewel    . Gout 10/21/2018  . History of acute respiratory distress syndrome (ARDS) due to COVID-19 virus   . History of cardiac arrest 04/27/2019  . History of non-ST elevation myocardial infarction (NSTEMI) 04/18/2019  . history of respiratory arrest   . Hyperlipidemia   . Hypertension   . Mitral valve regurgitation 09/19/2015   Echo 09/2015   . 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 OSA treated with BiPAP 06/17/2010  . Protein-calorie malnutrition ([place]) 06/01/2019   Poor PO intake, only eating 50% of meals, could benefit from feeding tube if persistently poor PO intake.  . Pulmonary hypertension ([place]) 04/14/2019   RVSP 38 (TTE 2017) but repeat TTE March 2019 with RVSP 25 RAP in 2020: 66mhg  . Sleep apnea    cpap nightly    Past Surgical History:  Procedure Laterality Date  . AV FISTULA PLACEMENT Left 09/27/2014  . INSERTION OF ARTERIOVENOUS (AV) ARTEGRAFT ARM Left 04/02/2018   Procedure: INSERTION OF ARTERIOVENOUS (AV) ARTEGRAFT ARM LEFT UPPER ARM;  Surgeon: CWaynetta Sandy MD;  [facility];  Service: Vascular;  Laterality: Left;  . INSERTION OF DIALYSIS CATHETER N/A 04/02/2018   Procedure: INSERTION OF DIALYSIS CATHETER, right internal jugular;  Surgeon: Waynetta Sandy, MD;  Location: [place];  Service: Vascular;  Laterality: N/A;  . NECK SURGERY     C6 & C7 replaced 30 yrs ago per pt  . REVISON OF ARTERIOVENOUS FISTULA Left 04/02/2018   Procedure: REVISION PLICATION OF ARTERIOVENOUS FISTULA ARM;  Surgeon: Waynetta Sandy, MD;  Location: [place];  Service:  Vascular;  Laterality: Left;  . WISDOM TOOTH EXTRACTION      Vitals:   05/25/20 0844  BP: (!) 152/84  Pulse: (!) 112  SpO2: 99%     Subjective Assessment - 05/25/20 0808    Subjective Doing well today.  Nothing new to report.  Was able to hold his grandson last night and feed him a bottle without tremors.    Pertinent History CHF, HTN, ERSD    Limitations Standing;Walking;Writing;House hold activities;Lifting    How long can you sit comfortably? supported sitting no problem    How long can you stand comfortably? <5 min    How long can you walk comfortably? 20 feet with walker    Patient Stated Goals improve walking get stronger    Currently in Pain? No/denies              [facility] PT Assessment - 05/25/20 7116      Ambulation/Gait   Ambulation/Gait Yes    Ambulation/Gait Assistance 5: Supervision;4: Min guard    Ambulation Distance (Feet) 500 Feet    Assistive device Rolling walker;Straight cane    Ambulation Surface Level;Indoor    Stairs Yes    Stairs Assistance 4: Min assist    Stairs Assistance Details (indicate cue type and reason) first set of 4 stairs with cane and rail pt required cues for sequencing with cane.  Greater difficulty descending with cane, tended to lean on cane on R to descend with RLE; cues to lean L to descend with RLE.  Second set performed with bilat rails; both with step to sequencing and min A due to tremors    Stair Management Technique One rail Right;One rail Left;Two rails;Step to pattern;Forwards;With cane    Number of Stairs 8    Height of Stairs 6      Timed Up and Go Test   TUG Normal TUG    Normal TUG (seconds) 16.9    TUG Comments with cane with rubber tip, less tremors today, supervision.  Slow with turns to L and R                         [facility] Adult PT Treatment/Exercise - 05/25/20 0812      Transfers   Transfers Sit to Stand;Stand to Sit;Stand Pivot Transfers    Sit to Stand 6: Modified independent  (Device/Increase time)    Stand to Sit 6: Modified independent (Device/Increase time)    Stand Pivot Transfers 6: Modified independent (Device/Increase time)      Ambulation/Gait   Ambulation/Gait Assistance Details began with RW and supervision to allow smooth, continuous stepping pattern and then transitioned to cane with light HHA on L side due to tremors; pt able to intermittently continue stepping pattern through turns to L; last lap pt had to switch back to RW due to tremors and LE fatigue.    Gait Pattern Step-through pattern;Decreased step length - right;Decreased step length - left;Decreased stride length;Poor  foot clearance - left;Poor foot clearance - right;Trunk flexed;Step-to pattern   tremors                 PT Education - 05/25/20 0927    Education Details progress towards goals, areas to continue to address    Person(s) Educated Patient    Methods Explanation    Comprehension Verbalized understanding            PT Short Term Goals - 05/25/20 0818      PT SHORT TERM GOAL #1   Title Pt will ambulate x 500' outside over pavement with quad cane and supervision    Baseline 500' indoors with RW and cane with therapist supervision-min A; tremors    Time 4    Period Weeks    Status Not Met    Target Date 05/25/20      PT SHORT TERM GOAL #2   Title Patient will be able to ascend and descend 8 stairs with step to sequence with one rail and cane with supervision    Time 4    Period Weeks    Status Not Met    Target Date 05/25/20      PT SHORT TERM GOAL #3   Title Pt will increase BERG score to >/= 44/56    Baseline 05/23/20: 44/56 scored today, met with session today    Status Achieved    Target Date 05/25/20      PT SHORT TERM GOAL #4   Title Pt will decrease TUG with cane to </= 18 seconds with supervision    Baseline 16.9 seconds with cane; no tremors.    Status Achieved    Target Date 05/25/20             PT Long Term Goals - 04/25/20 1014       PT LONG TERM GOAL #1   Title Patient will be able to ambulate >1000 feet with quad cane and supervision to improve community ambulation    Baseline 800' with quad cane outside with frequent rest breaks and min-mod A when pt fatigues    Time 8    Period Weeks    Status Revised    Target Date 06/24/20      PT LONG TERM GOAL #2   Title Patient will be able to perform TUG <15 seconds with RW to improve overall functional mobility    Baseline 36 seconds with RW, 22 seconds with straight cane (04/18/20)    Time 8    Period Weeks    Status Revised    Target Date 06/24/20      PT LONG TERM GOAL #3   Title Patient will be able to go up and down 17 steps with use of one rail and cane, step to sequence with Supervision    Baseline 16 stairs with bilat rails and min A    Time 8    Period Weeks    Status Revised    Target Date 06/24/20      PT LONG TERM GOAL #4   Title Patient will demo improvement on Berg balance scale to at least 48/56 to improve functional balance and reduce fall risk    Baseline 41/56    Time 8    Period Weeks    Status Revised    Target Date 06/24/20                 Plan - 05/25/20 0928    Clinical Impression Statement Continued  assessment of progress towards STG.  Pt is making steady progress and has met 2/4 STG, and did not meet other two goals.  Pt demonstrates improvement in standing balance and decreased falls risk as indicated by five time sit to stand and BERG.  Pt continues to require use of RW when ambulating outside or when ambulating longer distances due to tremors.  Pt also continues to require bilat UE support on rails to safely perform stairs and is unable to perform without assistance when using cane and rail.  Pt also continues to demonstrate decreased safety in turns.  PT to continue to address these impairments to maximize functional mobility independence and decrease falls risk.    Personal Factors and Comorbidities Comorbidity 3+;Past/Current  Experience;Time since onset of injury/illness/exacerbation;Transportation;Fitness;Age    Comorbidities CHF, ESRD, HTN    Examination-Activity Limitations Bathing;Bed Mobility;Caring for Others;Carry;Dressing;Hygiene/Grooming;Lift;Locomotion Level;Self Feeding;Sit;Squat;Stairs;Stand;Toileting;Transfers    Examination-Participation Restrictions Cleaning;Community Activity;Driving;Interpersonal Relationship;Laundry;Medication Management;Meal Prep;Shop;Yard Work    Stability/Clinical Decision Making Evolving/Moderate complexity    Rehab Potential Good    PT Frequency 2x / week    PT Duration 8 weeks    PT Treatment/Interventions ADLs/Self Care Home Management;Gait training;Stair training;Functional mobility training;Therapeutic activities;Therapeutic exercise;Balance training;Neuromuscular re-education;Patient/family education;Manual techniques;Passive range of motion;Joint Manipulations    PT Next Visit Plan Work on endurance.  Work on turns with cane.  Work on stairs, especially descending.  Continue to work on gait outside with RW if weather allows, progress to gravel/grass as able.  Standing balance activities reducing UE support, SLS activities.  Stair and curb negotiation decreasing UE support (cane).  Working towards less w/c use.    PT [place]- updated on 04/12/20    Consulted and Agree with Plan of Care Patient           Patient will benefit from skilled therapeutic intervention in order to improve the following deficits and impairments:  Abnormal gait,Decreased activity tolerance,Decreased balance,Decreased mobility,Decreased endurance,Decreased coordination,Decreased range of motion,Decreased safety awareness,Decreased strength,Difficulty walking,Impaired flexibility,Postural dysfunction,Impaired UE functional use,Impaired tone  Visit Diagnosis: Unsteadiness on feet  Muscle weakness (generalized)  Other abnormalities of gait and mobility     Problem  List Patient Active Problem List   Diagnosis Date Noted  . 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 Gait abnormality 08/12/2019  . History of gout 06/17/2019  . Bowel incontinence 06/09/2019  . Anoxic brain injury ([place])   . History of cardiac arrest 04/27/2019  . History of non-ST elevation myocardial infarction (NSTEMI) 04/18/2019  . Morbid obesity ([place]) 04/14/2019  . Diabetic neuropathy ([place]) 04/02/2016  . Hypertension associated with diabetes ([place]) 10/03/2015  . HLD (hyperlipidemia) 09/06/2015  . End stage renal disease on dialysis ([place]) 08/31/2015  . DM (diabetes mellitus), type 2 with renal complications ([place]) 83/81/8403  . OSA treated with BiPAP 12/12/2013  . Coronary atherosclerosis 11/01/2009    Rico Junker, PT, DPT 05/25/20    9:34 AM    [facility] [address] [place], [place], [address] Phone: [phone]   Fax:  [phone]  Name: Jaking Thayer MRN: [id number] Date of Birth: [birth year]

## 2020-05-26 DIAGNOSIS — Z23 Encounter for immunization: Secondary | ICD-10-CM | POA: Diagnosis not present

## 2020-05-26 DIAGNOSIS — Z992 Dependence on renal dialysis: Secondary | ICD-10-CM | POA: Diagnosis not present

## 2020-05-26 DIAGNOSIS — N2581 Secondary hyperparathyroidism of renal origin: Secondary | ICD-10-CM | POA: Diagnosis not present

## 2020-05-26 DIAGNOSIS — D631 Anemia in chronic kidney disease: Secondary | ICD-10-CM | POA: Diagnosis not present

## 2020-05-26 DIAGNOSIS — N186 End stage renal disease: Secondary | ICD-10-CM | POA: Diagnosis not present

## 2020-05-26 DIAGNOSIS — E1121 Type 2 diabetes mellitus with diabetic nephropathy: Secondary | ICD-10-CM | POA: Diagnosis not present

## 2020-05-29 DIAGNOSIS — D631 Anemia in chronic kidney disease: Secondary | ICD-10-CM | POA: Diagnosis not present

## 2020-05-29 DIAGNOSIS — E1121 Type 2 diabetes mellitus with diabetic nephropathy: Secondary | ICD-10-CM | POA: Diagnosis not present

## 2020-05-29 DIAGNOSIS — N2581 Secondary hyperparathyroidism of renal origin: Secondary | ICD-10-CM | POA: Diagnosis not present

## 2020-05-29 DIAGNOSIS — Z992 Dependence on renal dialysis: Secondary | ICD-10-CM | POA: Diagnosis not present

## 2020-05-29 DIAGNOSIS — N186 End stage renal disease: Secondary | ICD-10-CM | POA: Diagnosis not present

## 2020-05-29 DIAGNOSIS — Z23 Encounter for immunization: Secondary | ICD-10-CM | POA: Diagnosis not present

## 2020-05-30 ENCOUNTER — Ambulatory Visit: Payer: Medicare Other | Admitting: Physical Therapy

## 2020-05-31 DIAGNOSIS — D631 Anemia in chronic kidney disease: Secondary | ICD-10-CM | POA: Diagnosis not present

## 2020-05-31 DIAGNOSIS — E1121 Type 2 diabetes mellitus with diabetic nephropathy: Secondary | ICD-10-CM | POA: Diagnosis not present

## 2020-05-31 DIAGNOSIS — N2581 Secondary hyperparathyroidism of renal origin: Secondary | ICD-10-CM | POA: Diagnosis not present

## 2020-05-31 DIAGNOSIS — Z23 Encounter for immunization: Secondary | ICD-10-CM | POA: Diagnosis not present

## 2020-05-31 DIAGNOSIS — Z992 Dependence on renal dialysis: Secondary | ICD-10-CM | POA: Diagnosis not present

## 2020-05-31 DIAGNOSIS — N186 End stage renal disease: Secondary | ICD-10-CM | POA: Diagnosis not present

## 2020-06-01 ENCOUNTER — Ambulatory Visit: Payer: Medicare Other | Admitting: Physical Therapy

## 2020-06-01 ENCOUNTER — Other Ambulatory Visit: Payer: Self-pay

## 2020-06-01 ENCOUNTER — Encounter: Payer: Self-pay | Admitting: Physical Therapy

## 2020-06-01 DIAGNOSIS — R2681 Unsteadiness on feet: Secondary | ICD-10-CM

## 2020-06-01 DIAGNOSIS — M6281 Muscle weakness (generalized): Secondary | ICD-10-CM | POA: Diagnosis not present

## 2020-06-01 DIAGNOSIS — R2689 Other abnormalities of gait and mobility: Secondary | ICD-10-CM | POA: Diagnosis not present

## 2020-06-01 NOTE — Therapy (Signed)
[facility] [address] [place], [place], [address] Phone: [phone]   Fax:  [phone]  Physical Therapy Treatment  Patient Details  Name: Troy Wells MRN: [id number] Date of Birth: [birth year] Referring Provider (PT): Dr. Andrena Mews   Encounter Date: 06/01/2020   PT End of Session - 06/01/20 0809    Visit Number 41    Number of Visits 48    Date for PT Re-Evaluation 06/24/20    Authorization Type Medicare parts A&B; KX modifier required now    Progress Note Due on Visit 79    PT Start Time 0804    PT Stop Time 0845    PT Time Calculation (min) 41 min    Equipment Utilized During Treatment Gait belt    Activity Tolerance Patient tolerated treatment well    Behavior During Therapy [facility] for tasks assessed/performed           Past Medical History:  Diagnosis Date  . CARDIAC ARREST 11/01/2009   Qualifier: Diagnosis of  By: Selena Batten CMA, Jewel    . Coagulase negative Staphylococcus bacteremia 06/30/2019  . Colon polyps 01/02/2016   Colonoscopy July 2017 - One 3 mm polyp in the transverse colon, removed with a cold biopsy forceps. Resected and retrieved. - One 3 mm polyp in the rectum, removed with a cold biopsy forceps. Resected and retrieved. - Diverticulosis in the entire examined colon. - Non-bleeding internal hemorrhoids. - The examination was otherwise normal. - Significant looping which prolonged cecal    . Coronary artery disease 08/15/2009   with AMI  . Coronary atherosclerosis 11/01/2009   Qualifier: Diagnosis of  By: Selena Batten CMA, Jewel    . Dialysis patient ([place]) 09/27/2014   mon, wed, and fri  . Diverticulosis of colon without hemorrhage 01/02/2016   Colonoscopy July 2017 - One 3 mm polyp in the transverse colon, removed with a cold biopsy forceps. Resected and retrieved. - One 3 mm polyp in the rectum, removed with a cold biopsy forceps. Resected and retrieved. - Diverticulosis in the  entire examined colon. - Non-bleeding internal hemorrhoids. - The examination was otherwise normal. - Significant looping which prolonged cecal    . DM (diabetes mellitus), type 2 with renal complications ([place]) 51/07/5850  . Essential hypertension 11/01/2009   Qualifier: Diagnosis of  By: Selena Batten CMA, Jewel    . Gout 10/21/2018  . History of acute respiratory distress syndrome (ARDS) due to COVID-19 virus   . History of cardiac arrest 04/27/2019  . History of non-ST elevation myocardial infarction (NSTEMI) 04/18/2019  . history of respiratory arrest   . Hyperlipidemia   . Hypertension   . Mitral valve regurgitation 09/19/2015   Echo 09/2015   . 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 OSA treated with BiPAP 06/17/2010  . Protein-calorie malnutrition ([place]) 06/01/2019   Poor PO intake, only eating 50% of meals, could benefit from feeding tube if persistently poor PO intake.  . Pulmonary hypertension ([place]) 04/14/2019   RVSP 38 (TTE 2017) but repeat TTE March 2019 with RVSP 25 RAP in 2020: 72mmhg  . Sleep apnea    cpap nightly    Past Surgical History:  Procedure Laterality Date  . AV FISTULA PLACEMENT Left 09/27/2014  . INSERTION OF ARTERIOVENOUS (AV) ARTEGRAFT ARM Left 04/02/2018   Procedure: INSERTION OF ARTERIOVENOUS (AV) ARTEGRAFT ARM LEFT UPPER ARM;  Surgeon: Waynetta Sandy, MD;  Location: [place];  Service: Vascular;  Laterality: Left;  . INSERTION OF DIALYSIS CATHETER N/A 04/02/2018  Procedure: INSERTION OF DIALYSIS CATHETER, right internal jugular;  Surgeon: Waynetta Sandy, MD;  Location: [facility];  Service: Vascular;  Laterality: N/A;  . NECK SURGERY     C6 & C7 replaced 30 yrs ago per pt  . REVISON OF ARTERIOVENOUS FISTULA Left 04/02/2018   Procedure: REVISION PLICATION OF ARTERIOVENOUS FISTULA ARM;  Surgeon: Waynetta Sandy, MD;  Location: [facility];  Service: Vascular;  Laterality: Left;  . WISDOM TOOTH EXTRACTION      There were no vitals filed for this  visit.   Subjective Assessment - 06/01/20 0808    Subjective No new complatints. No falls or pain to report. Reports tremors have been "okay".    Pertinent History CHF, HTN, ERSD    Limitations Standing;Walking;Writing;House hold activities;Lifting    How long can you sit comfortably? supported sitting no problem    How long can you stand comfortably? <5 min    How long can you walk comfortably? 20 feet with walker    Patient Stated Goals improve walking get stronger    Currently in Pain? No/denies    Pain Score 0-No pain                 [facility] Adult PT Treatment/Exercise - 06/01/20 0810      Transfers   Transfers Sit to Stand;Stand to Sit;Stand Pivot Transfers    Sit to Stand 6: Modified independent (Device/Increase time)    Stand to Sit 6: Modified independent (Device/Increase time)      Ambulation/Gait   Ambulation/Gait Yes    Ambulation/Gait Assistance 5: Supervision;4: Min guard    Ambulation/Gait Assistance Details cues for posture. HHA need as each gait rep intiated for a few steps due to tremors.    Ambulation Distance (Feet) 230 Feet   x2, plus around gym with session   Assistive device Straight cane   with rubber quad tip   Gait Pattern Step-through pattern;Decreased step length - right;Decreased step length - left;Decreased stride length;Poor foot clearance - left;Poor foot clearance - right;Trunk flexed;Step-to pattern    Ambulation Surface Level;Indoor      High Level Balance   High Level Balance Activities Negotiating over obstacles;Negotitating around obstacles    High Level Balance Comments with cane: weaving around 5 cones with 180* turns at each end for 4 laps with min guard assist, cues for cane placement with 180* turns. then with 3 bolsters next to counter top: forward steppping over with cane/counter support for 6 laps with min guard assist. Increased time needed to initate stepping over due to tremors/"hesitant stepping" that improved as reps progressed.       Knee/Hip Exercises: Aerobic   Other Aerobic Scifit with UE/LE's level 5.5 with goal >/=50 rpm x 8 minutes for activity tolerance, strengthening and reciprocal movements.                     PT Short Term Goals - 05/25/20 0818      PT SHORT TERM GOAL #1   Title Pt will ambulate x 500' outside over pavement with quad cane and supervision    Baseline 500' indoors with RW and cane with therapist supervision-min A; tremors    Time 4    Period Weeks    Status Not Met    Target Date 05/25/20      PT SHORT TERM GOAL #2   Title Patient will be able to ascend and descend 8 stairs with step to sequence with one rail and cane  with supervision    Time 4    Period Weeks    Status Not Met    Target Date 05/25/20      PT SHORT TERM GOAL #3   Title Pt will increase BERG score to >/= 44/56    Baseline 05/23/20: 44/56 scored today, met with session today    Status Achieved    Target Date 05/25/20      PT SHORT TERM GOAL #4   Title Pt will decrease TUG with cane to </= 18 seconds with supervision    Baseline 16.9 seconds with cane; no tremors.    Status Achieved    Target Date 05/25/20             PT Long Term Goals - 04/25/20 1014      PT LONG TERM GOAL #1   Title Patient will be able to ambulate >1000 feet with quad cane and supervision to improve community ambulation    Baseline 800' with quad cane outside with frequent rest breaks and min-mod A when pt fatigues    Time 8    Period Weeks    Status Revised    Target Date 06/24/20      PT LONG TERM GOAL #2   Title Patient will be able to perform TUG <15 seconds with RW to improve overall functional mobility    Baseline 36 seconds with RW, 22 seconds with straight cane (04/18/20)    Time 8    Period Weeks    Status Revised    Target Date 06/24/20      PT LONG TERM GOAL #3   Title Patient will be able to go up and down 17 steps with use of one rail and cane, step to sequence with Supervision    Baseline 16 stairs  with bilat rails and min A    Time 8    Period Weeks    Status Revised    Target Date 06/24/20      PT LONG TERM GOAL #4   Title Patient will demo improvement on Berg balance scale to at least 48/56 to improve functional balance and reduce fall risk    Baseline 41/56    Time 8    Period Weeks    Status Revised    Target Date 06/24/20                 Plan - 06/01/20 0809    Clinical Impression Statement Today's skilled session continued to focus on strengthening and gait/balance with cane. Pt with minimal tremors noted with session today. The pt is progressing toward goals and should benefit from continued PT to progress toward unmet goals.    Personal Factors and Comorbidities Comorbidity 3+;Past/Current Experience;Time since onset of injury/illness/exacerbation;Transportation;Fitness;Age    Comorbidities CHF, ESRD, HTN    Examination-Activity Limitations Bathing;Bed Mobility;Caring for Others;Carry;Dressing;Hygiene/Grooming;Lift;Locomotion Level;Self Feeding;Sit;Squat;Stairs;Stand;Toileting;Transfers    Examination-Participation Restrictions Cleaning;Community Activity;Driving;Interpersonal Relationship;Laundry;Medication Management;Meal Prep;Shop;Yard Work    Stability/Clinical Decision Making Evolving/Moderate complexity    Rehab Potential Good    PT Frequency 2x / week    PT Duration 8 weeks    PT Treatment/Interventions ADLs/Self Care Home Management;Gait training;Stair training;Functional mobility training;Therapeutic activities;Therapeutic exercise;Balance training;Neuromuscular re-education;Patient/family education;Manual techniques;Passive range of motion;Joint Manipulations    PT Next Visit Plan Work on endurance.  Work on turns with cane.  Work on stairs, especially descending.  Continue to work on gait outside with RW if weather allows, progress to gravel/grass as able.  Standing balance activities reducing UE support,  SLS activities.  Stair and curb negotiation  decreasing UE support (cane).  Working towards less w/c use.    PT [place]- updated on 04/12/20    Consulted and Agree with Plan of Care Patient           Patient will benefit from skilled therapeutic intervention in order to improve the following deficits and impairments:  Abnormal gait,Decreased activity tolerance,Decreased balance,Decreased mobility,Decreased endurance,Decreased coordination,Decreased range of motion,Decreased safety awareness,Decreased strength,Difficulty walking,Impaired flexibility,Postural dysfunction,Impaired UE functional use,Impaired tone  Visit Diagnosis: Unsteadiness on feet  Muscle weakness (generalized)  Other abnormalities of gait and mobility     Problem List Patient Active Problem List   Diagnosis Date Noted  . 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 Gait abnormality 08/12/2019  . History of gout 06/17/2019  . Bowel incontinence 06/09/2019  . Anoxic brain injury ([place])   . History of cardiac arrest 04/27/2019  . History of non-ST elevation myocardial infarction (NSTEMI) 04/18/2019  . Morbid obesity ([place]) 04/14/2019  . Diabetic neuropathy ([place]) 04/02/2016  . Hypertension associated with diabetes ([place]) 10/03/2015  . HLD (hyperlipidemia) 09/06/2015  . End stage renal disease on dialysis ([place]) 08/31/2015  . DM (diabetes mellitus), type 2 with renal complications ([place]) 52/17/4715  . OSA treated with BiPAP 12/12/2013  . Coronary atherosclerosis 11/01/2009    Willow Ora, PTA, [facility] [address] [place], [place] [address] [phone] 06/01/20, 5:31 PM   Name: Troy Wells MRN: [id number] Date of Birth: Oct 21, 1965

## 2020-06-02 DIAGNOSIS — Z23 Encounter for immunization: Secondary | ICD-10-CM | POA: Diagnosis not present

## 2020-06-02 DIAGNOSIS — Z992 Dependence on renal dialysis: Secondary | ICD-10-CM | POA: Diagnosis not present

## 2020-06-02 DIAGNOSIS — D631 Anemia in chronic kidney disease: Secondary | ICD-10-CM | POA: Diagnosis not present

## 2020-06-02 DIAGNOSIS — N186 End stage renal disease: Secondary | ICD-10-CM | POA: Diagnosis not present

## 2020-06-02 DIAGNOSIS — N2581 Secondary hyperparathyroidism of renal origin: Secondary | ICD-10-CM | POA: Diagnosis not present

## 2020-06-02 DIAGNOSIS — E1121 Type 2 diabetes mellitus with diabetic nephropathy: Secondary | ICD-10-CM | POA: Diagnosis not present

## 2020-06-03 ENCOUNTER — Ambulatory Visit: Payer: Medicare Other

## 2020-06-05 DIAGNOSIS — Z23 Encounter for immunization: Secondary | ICD-10-CM | POA: Diagnosis not present

## 2020-06-05 DIAGNOSIS — D631 Anemia in chronic kidney disease: Secondary | ICD-10-CM | POA: Diagnosis not present

## 2020-06-05 DIAGNOSIS — N2581 Secondary hyperparathyroidism of renal origin: Secondary | ICD-10-CM | POA: Diagnosis not present

## 2020-06-05 DIAGNOSIS — Z992 Dependence on renal dialysis: Secondary | ICD-10-CM | POA: Diagnosis not present

## 2020-06-05 DIAGNOSIS — N186 End stage renal disease: Secondary | ICD-10-CM | POA: Diagnosis not present

## 2020-06-05 DIAGNOSIS — E1121 Type 2 diabetes mellitus with diabetic nephropathy: Secondary | ICD-10-CM | POA: Diagnosis not present

## 2020-06-06 ENCOUNTER — Other Ambulatory Visit: Payer: Self-pay

## 2020-06-06 ENCOUNTER — Ambulatory Visit: Payer: Medicare Other | Admitting: Physical Therapy

## 2020-06-06 ENCOUNTER — Encounter: Payer: Self-pay | Admitting: Physical Therapy

## 2020-06-06 DIAGNOSIS — R2689 Other abnormalities of gait and mobility: Secondary | ICD-10-CM

## 2020-06-06 DIAGNOSIS — M6281 Muscle weakness (generalized): Secondary | ICD-10-CM | POA: Diagnosis not present

## 2020-06-06 DIAGNOSIS — R2681 Unsteadiness on feet: Secondary | ICD-10-CM | POA: Diagnosis not present

## 2020-06-06 NOTE — Therapy (Signed)
[facility] [address] [place], [place], [address] Phone: [phone]   Fax:  [phone]  Physical Therapy Treatment  Patient Details  Name: Troy Wells MRN: [id number] Date of Birth: 15-Sep-1965 Referring Provider (PT): Dr. Andrena Mews   Encounter Date: 06/06/2020   PT End of Session - 06/06/20 0852    Visit Number 45    Number of Visits 10    Date for PT Re-Evaluation 06/24/20    Authorization Type Medicare parts A&B; KX modifier required now    Progress Note Due on Visit 47    PT Start Time 0805    PT Stop Time 0845    PT Time Calculation (min) 40 min    Activity Tolerance Patient tolerated treatment well    Behavior During Therapy [facility] for tasks assessed/performed           Past Medical History:  Diagnosis Date  . CARDIAC ARREST 11/01/2009   Qualifier: Diagnosis of  By: Selena Batten CMA, Jewel    . Coagulase negative Staphylococcus bacteremia 06/30/2019  . Colon polyps 01/02/2016   Colonoscopy July 2017 - One 3 mm polyp in the transverse colon, removed with a cold biopsy forceps. Resected and retrieved. - One 3 mm polyp in the rectum, removed with a cold biopsy forceps. Resected and retrieved. - Diverticulosis in the entire examined colon. - Non-bleeding internal hemorrhoids. - The examination was otherwise normal. - Significant looping which prolonged cecal    . Coronary artery disease 08/15/2009   with AMI  . Coronary atherosclerosis 11/01/2009   Qualifier: Diagnosis of  By: Selena Batten CMA, Jewel    . Dialysis patient ([place]) 09/27/2014   mon, wed, and fri  . Diverticulosis of colon without hemorrhage 01/02/2016   Colonoscopy July 2017 - One 3 mm polyp in the transverse colon, removed with a cold biopsy forceps. Resected and retrieved. - One 3 mm polyp in the rectum, removed with a cold biopsy forceps. Resected and retrieved. - Diverticulosis in the entire examined colon. - Non-bleeding internal  hemorrhoids. - The examination was otherwise normal. - Significant looping which prolonged cecal    . DM (diabetes mellitus), type 2 with renal complications ([place]) 02/40/9735  . Essential hypertension 11/01/2009   Qualifier: Diagnosis of  By: Selena Batten CMA, Jewel    . Gout 10/21/2018  . History of acute respiratory distress syndrome (ARDS) due to COVID-19 virus   . History of cardiac arrest 04/27/2019  . History of non-ST elevation myocardial infarction (NSTEMI) 04/18/2019  . history of respiratory arrest   . Hyperlipidemia   . Hypertension   . Mitral valve regurgitation 09/19/2015   Echo 09/2015   . 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 OSA treated with BiPAP 06/17/2010  . Protein-calorie malnutrition ([place]) 06/01/2019   Poor PO intake, only eating 50% of meals, could benefit from feeding tube if persistently poor PO intake.  . Pulmonary hypertension ([place]) 04/14/2019   RVSP 38 (TTE 2017) but repeat TTE March 2019 with RVSP 25 RAP in 2020: 29mhg  . Sleep apnea    cpap nightly    Past Surgical History:  Procedure Laterality Date  . AV FISTULA PLACEMENT Left 09/27/2014  . INSERTION OF ARTERIOVENOUS (AV) ARTEGRAFT ARM Left 04/02/2018   Procedure: INSERTION OF ARTERIOVENOUS (AV) ARTEGRAFT ARM LEFT UPPER ARM;  Surgeon: CWaynetta Sandy MD;  [facility]: [facility]  Service: Vascular;  Laterality: Left;  . INSERTION OF DIALYSIS CATHETER N/A 04/02/2018   Procedure: INSERTION OF DIALYSIS CATHETER, right internal jugular;  Surgeon: Waynetta Sandy, MD;  Location: [facility];  Service: Vascular;  Laterality: N/A;  . NECK SURGERY     C6 & C7 replaced 30 yrs ago per pt  . REVISON OF ARTERIOVENOUS FISTULA Left 04/02/2018   Procedure: REVISION PLICATION OF ARTERIOVENOUS FISTULA ARM;  Surgeon: Waynetta Sandy, MD;  Location: [facility];  Service: Vascular;  Laterality: Left;  . WISDOM TOOTH EXTRACTION      There were no vitals filed for this visit.   Subjective Assessment - 06/06/20 0810     Subjective No issues to report.  No tremors.  Is now getting himself ready and out to transportation each day without assistance.    Pertinent History CHF, HTN, ERSD    Limitations Standing;Walking;Writing;House hold activities;Lifting    How long can you sit comfortably? supported sitting no problem    How long can you stand comfortably? <5 min    How long can you walk comfortably? 20 feet with walker    Patient Stated Goals improve walking get stronger    Currently in Pain? No/denies                             [facility] Adult PT Treatment/Exercise - 06/06/20 9735      Ambulation/Gait   Ambulation/Gait Yes    Ambulation/Gait Assistance 5: Supervision;4: Min guard    Ambulation/Gait Assistance Details two hoola-hoops on ground focusing on turning to L and R taking longer inner steps and shorter outer steps and transitioning between L and R turns.  Focused more on turns to the R due to pt taking shorter steps on RLE.  Transitioned to walking forwards and weaving L and R around 6 cones x 4 laps with cane and supervision.  On last lap added in cognitive dual task with gradual shortening of pt's step length.  Changed to figure 8 gait around two obstacles on compliant red mat with cane and min A due to shortened step length and decreased foot clearance.    Assistive device Straight cane    Ambulation Surface Level;Indoor    Stairs Yes    Stairs Assistance 5: Supervision;4: Min guard    Stairs Assistance Details (indicate cue type and reason) supervision when ascending with bilat rails and step to sequence, no hesitancy or tremors when ascending.  Pt demonstrated less reliance/leaning on rail when descending but at bottom step pt required min A from therapist to complete last step (runs out of rail).  Slight increase in tremors when descending.    Stair Management Technique Two rails;Step to pattern;Forwards    Number of Stairs 8    Height of Stairs 6      Knee/Hip Exercises:  Aerobic   Other Aerobic SciFit with bilat UE and LE at level 5.5 resistance x 9 minutes for aerobic conditioning and strengthening.                    PT Short Term Goals - 05/25/20 0818      PT SHORT TERM GOAL #1   Title Pt will ambulate x 500' outside over pavement with quad cane and supervision    Baseline 500' indoors with RW and cane with therapist supervision-min A; tremors    Time 4    Period Weeks    Status Not Met    Target Date 05/25/20      PT SHORT TERM GOAL #2   Title Patient will be able to  ascend and descend 8 stairs with step to sequence with one rail and cane with supervision    Time 4    Period Weeks    Status Not Met    Target Date 05/25/20      PT SHORT TERM GOAL #3   Title Pt will increase BERG score to >/= 44/56    Baseline 05/23/20: 44/56 scored today, met with session today    Status Achieved    Target Date 05/25/20      PT SHORT TERM GOAL #4   Title Pt will decrease TUG with cane to </= 18 seconds with supervision    Baseline 16.9 seconds with cane; no tremors.    Status Achieved    Target Date 05/25/20             PT Long Term Goals - 04/25/20 1014      PT LONG TERM GOAL #1   Title Patient will be able to ambulate >1000 feet with quad cane and supervision to improve community ambulation    Baseline 800' with quad cane outside with frequent rest breaks and min-mod A when pt fatigues    Time 8    Period Weeks    Status Revised    Target Date 06/24/20      PT LONG TERM GOAL #2   Title Patient will be able to perform TUG <15 seconds with RW to improve overall functional mobility    Baseline 36 seconds with RW, 22 seconds with straight cane (04/18/20)    Time 8    Period Weeks    Status Revised    Target Date 06/24/20      PT LONG TERM GOAL #3   Title Patient will be able to go up and down 17 steps with use of one rail and cane, step to sequence with Supervision    Baseline 16 stairs with bilat rails and min A    Time 8     Period Weeks    Status Revised    Target Date 06/24/20      PT LONG TERM GOAL #4   Title Patient will demo improvement on Berg balance scale to at least 48/56 to improve functional balance and reduce fall risk    Baseline 41/56    Time 8    Period Weeks    Status Revised    Target Date 06/24/20                 Plan - 06/06/20 1829    Clinical Impression Statement Pt continues to make progress with overall endurance and activity tolerance.  Gait training with cane today continued to focus on safety and sequencing with turns/pivoting adding in dual task and more compliant surface.  Pt continues to demonstrate improvement in confidence and safety with stair negotiation.  Will continue to address and progress towards LTG.    Personal Factors and Comorbidities Comorbidity 3+;Past/Current Experience;Time since onset of injury/illness/exacerbation;Transportation;Fitness;Age    Comorbidities CHF, ESRD, HTN    Examination-Activity Limitations Bathing;Bed Mobility;Caring for Others;Carry;Dressing;Hygiene/Grooming;Lift;Locomotion Level;Self Feeding;Sit;Squat;Stairs;Stand;Toileting;Transfers    Examination-Participation Restrictions Cleaning;Community Activity;Driving;Interpersonal Relationship;Laundry;Medication Management;Meal Prep;Shop;Yard Work    Stability/Clinical Decision Making Evolving/Moderate complexity    Rehab Potential Good    PT Frequency 2x / week    PT Duration 8 weeks    PT Treatment/Interventions ADLs/Self Care Home Management;Gait training;Stair training;Functional mobility training;Therapeutic activities;Therapeutic exercise;Balance training;Neuromuscular re-education;Patient/family education;Manual techniques;Passive range of motion;Joint Manipulations    PT Next Visit Plan Work on endurance.  Work on  turns with cane.  Work on stairs, especially descending.  Continue to work on gait outside with RW if weather allows, progress to gravel/grass as able.  Standing balance  activities reducing UE support, SLS activities.  Stair and curb negotiation decreasing UE support (cane).  Working towards less w/c use.    PT [facility]- updated on 04/12/20    Consulted and Agree with Plan of Care Patient           Patient will benefit from skilled therapeutic intervention in order to improve the following deficits and impairments:  Abnormal gait,Decreased activity tolerance,Decreased balance,Decreased mobility,Decreased endurance,Decreased coordination,Decreased range of motion,Decreased safety awareness,Decreased strength,Difficulty walking,Impaired flexibility,Postural dysfunction,Impaired UE functional use,Impaired tone  Visit Diagnosis: Muscle weakness (generalized)  Unsteadiness on feet  Other abnormalities of gait and mobility     Problem List Patient Active Problem List   Diagnosis Date Noted  . 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 Gait abnormality 08/12/2019  . History of gout 06/17/2019  . Bowel incontinence 06/09/2019  . Anoxic brain injury ([place])   . History of cardiac arrest 04/27/2019  . History of non-ST elevation myocardial infarction (NSTEMI) 04/18/2019  . Morbid obesity ([place]) 04/14/2019  . Diabetic neuropathy ([place]) 04/02/2016  . Hypertension associated with diabetes ([place]) 10/03/2015  . HLD (hyperlipidemia) 09/06/2015  . End stage renal disease on dialysis ([place]) 08/31/2015  . DM (diabetes mellitus), type 2 with renal complications ([place]) 24/40/1027  . OSA treated with BiPAP 12/12/2013  . Coronary atherosclerosis 11/01/2009    Rico Junker, PT, DPT 06/06/20    8:55 AM    [facility] [address] [place], [place], [address] Phone: [phone]   Fax:  [phone]  Name: Troy Wells MRN: [id number] Date of Birth: [birth year]

## 2020-06-07 DIAGNOSIS — N186 End stage renal disease: Secondary | ICD-10-CM | POA: Diagnosis not present

## 2020-06-07 DIAGNOSIS — E1121 Type 2 diabetes mellitus with diabetic nephropathy: Secondary | ICD-10-CM | POA: Diagnosis not present

## 2020-06-07 DIAGNOSIS — Z23 Encounter for immunization: Secondary | ICD-10-CM | POA: Diagnosis not present

## 2020-06-07 DIAGNOSIS — N2581 Secondary hyperparathyroidism of renal origin: Secondary | ICD-10-CM | POA: Diagnosis not present

## 2020-06-07 DIAGNOSIS — D631 Anemia in chronic kidney disease: Secondary | ICD-10-CM | POA: Diagnosis not present

## 2020-06-07 DIAGNOSIS — Z992 Dependence on renal dialysis: Secondary | ICD-10-CM | POA: Diagnosis not present

## 2020-06-08 ENCOUNTER — Ambulatory Visit: Payer: Medicare Other | Admitting: Physical Therapy

## 2020-06-08 ENCOUNTER — Encounter: Payer: Self-pay | Admitting: Physical Therapy

## 2020-06-08 ENCOUNTER — Other Ambulatory Visit: Payer: Self-pay

## 2020-06-08 DIAGNOSIS — R2681 Unsteadiness on feet: Secondary | ICD-10-CM

## 2020-06-08 DIAGNOSIS — M6281 Muscle weakness (generalized): Secondary | ICD-10-CM | POA: Diagnosis not present

## 2020-06-08 DIAGNOSIS — R2689 Other abnormalities of gait and mobility: Secondary | ICD-10-CM

## 2020-06-08 NOTE — Therapy (Signed)
[facility] [address] [place], [place], [address] Phone: [phone]   Fax:  [phone]  Physical Therapy Treatment  Patient Details  Name: Troy Wells MRN: [id number] Date of Birth: [birth year] Referring Provider (PT): Dr. Andrena Mews   Encounter Date: 06/08/2020   PT End of Session - 06/08/20 0808    Visit Number 18    Number of Visits 39    Date for PT Re-Evaluation 06/24/20    Authorization Type Medicare parts A&B; KX modifier required now    Progress Note Due on Visit 8    PT Start Time 0805    PT Stop Time 0843    PT Time Calculation (min) 38 min    Activity Tolerance Patient tolerated treatment well    Behavior During Therapy [facility] for tasks assessed/performed           Past Medical History:  Diagnosis Date  . CARDIAC ARREST 11/01/2009   Qualifier: Diagnosis of  By: Selena Batten CMA, Jewel    . Coagulase negative Staphylococcus bacteremia 06/30/2019  . Colon polyps 01/02/2016   Colonoscopy July 2017 - One 3 mm polyp in the transverse colon, removed with a cold biopsy forceps. Resected and retrieved. - One 3 mm polyp in the rectum, removed with a cold biopsy forceps. Resected and retrieved. - Diverticulosis in the entire examined colon. - Non-bleeding internal hemorrhoids. - The examination was otherwise normal. - Significant looping which prolonged cecal    . Coronary artery disease 08/15/2009   with AMI  . Coronary atherosclerosis 11/01/2009   Qualifier: Diagnosis of  By: Selena Batten CMA, Jewel    . Dialysis patient ([place]) 09/27/2014   mon, wed, and fri  . Diverticulosis of colon without hemorrhage 01/02/2016   Colonoscopy July 2017 - One 3 mm polyp in the transverse colon, removed with a cold biopsy forceps. Resected and retrieved. - One 3 mm polyp in the rectum, removed with a cold biopsy forceps. Resected and retrieved. - Diverticulosis in the entire examined colon. - Non-bleeding internal  hemorrhoids. - The examination was otherwise normal. - Significant looping which prolonged cecal    . DM (diabetes mellitus), type 2 with renal complications ([place]) 69/62/9528  . Essential hypertension 11/01/2009   Qualifier: Diagnosis of  By: Selena Batten CMA, Jewel    . Gout 10/21/2018  . History of acute respiratory distress syndrome (ARDS) due to COVID-19 virus   . History of cardiac arrest 04/27/2019  . History of non-ST elevation myocardial infarction (NSTEMI) 04/18/2019  . history of respiratory arrest   . Hyperlipidemia   . Hypertension   . Mitral valve regurgitation 09/19/2015   Echo 09/2015   . 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 OSA treated with BiPAP 06/17/2010  . Protein-calorie malnutrition ([place]) 06/01/2019   Poor PO intake, only eating 50% of meals, could benefit from feeding tube if persistently poor PO intake.  . Pulmonary hypertension ([place]) 04/14/2019   RVSP 38 (TTE 2017) but repeat TTE March 2019 with RVSP 25 RAP in 2020: 47mhg  . Sleep apnea    cpap nightly    Past Surgical History:  Procedure Laterality Date  . AV FISTULA PLACEMENT Left 09/27/2014  . INSERTION OF ARTERIOVENOUS (AV) ARTEGRAFT ARM Left 04/02/2018   Procedure: INSERTION OF ARTERIOVENOUS (AV) ARTEGRAFT ARM LEFT UPPER ARM;  Surgeon: CWaynetta Sandy MD;  [facility]: [facility]  Service: Vascular;  Laterality: Left;  . INSERTION OF DIALYSIS CATHETER N/A 04/02/2018   Procedure: INSERTION OF DIALYSIS CATHETER, right internal jugular;  Surgeon: Waynetta Sandy, MD;  Location: [facility];  Service: Vascular;  Laterality: N/A;  . NECK SURGERY     C6 & C7 replaced 30 yrs ago per pt  . REVISON OF ARTERIOVENOUS FISTULA Left 04/02/2018   Procedure: REVISION PLICATION OF ARTERIOVENOUS FISTULA ARM;  Surgeon: Waynetta Sandy, MD;  Location: [facility];  Service: Vascular;  Laterality: Left;  . WISDOM TOOTH EXTRACTION      There were no vitals filed for this visit.   Subjective Assessment - 06/08/20 0807     Subjective No new complaints. No falls or pain to report.    Pertinent History CHF, HTN, ERSD    Limitations Standing;Walking;Writing;House hold activities;Lifting    How long can you stand comfortably? <5 min    How long can you walk comfortably? 20 feet with walker    Patient Stated Goals improve walking get stronger    Currently in Pain? No/denies    Pain Score 0-No pain                [facility] Adult PT Treatment/Exercise - 06/08/20 0809      Transfers   Transfers Sit to Stand;Stand to Sit;Stand Pivot Transfers    Sit to Stand 6: Modified independent (Device/Increase time)    Stand to Sit 6: Modified independent (Device/Increase time)      Ambulation/Gait   Ambulation/Gait Yes    Ambulation/Gait Assistance 5: Supervision;4: Min guard    Ambulation/Gait Assistance Details around gym with session: obstacle course of 3 bolsters to step over<>hoola hoops on floor to perform figure 8 around- pt performed 4 laps with min guard assist and bil UE support with cane/counter for stepping over bolsters. Increased time needed with stepping over bolsters due to tremors and decrease initation. Progressed to couse of 2 cones on center of red mat with cones on foor at either end of the mat- pt performing 180 degree turn around cones on each end of mat while weaving around the 2 cones on the mat for 4 laps with min HHA needed due to tremors with decreased initiation at times.    Assistive device Straight cane   with rubber quad tip   Gait Pattern Step-through pattern;Decreased step length - right;Decreased step length - left;Decreased stride length;Poor foot clearance - left;Poor foot clearance - right;Trunk flexed;Step-to pattern    Ambulation Surface Level;Indoor    Stairs Yes    Stairs Assistance 5: Supervision    Stairs Assistance Details (indicate cue type and reason) pt with mild tremors both ascending and descending stairs with decreased hesitancy noted vs with previous sessions on stairs.     Stair Management Technique Two rails;Step to pattern;Forwards    Number of Stairs 4    Height of Stairs 6      High Level Balance   High Level Balance Activities Negotiating over obstacles;Figure 8 turns;Negotitating around obstacles    High Level Balance Comments see gait comments above for full details.      Knee/Hip Exercises: Aerobic   Other Aerobic Scifit with bilateral UE/LE's level 5.5 for 8 minutes with goal >/= 40 rpm for strengthening, activity tolerance and working on reciprocal movements.                PT Short Term Goals - 05/25/20 0818      PT SHORT TERM GOAL #1   Title Pt will ambulate x 500' outside over pavement with quad cane and supervision    Baseline 500' indoors with RW and cane  with therapist supervision-min A; tremors    Time 4    Period Weeks    Status Not Met    Target Date 05/25/20      PT SHORT TERM GOAL #2   Title Patient will be able to ascend and descend 8 stairs with step to sequence with one rail and cane with supervision    Time 4    Period Weeks    Status Not Met    Target Date 05/25/20      PT SHORT TERM GOAL #3   Title Pt will increase BERG score to >/= 44/56    Baseline 05/23/20: 44/56 scored today, met with session today    Status Achieved    Target Date 05/25/20      PT SHORT TERM GOAL #4   Title Pt will decrease TUG with cane to </= 18 seconds with supervision    Baseline 16.9 seconds with cane; no tremors.    Status Achieved    Target Date 05/25/20             PT Long Term Goals - 04/25/20 1014      PT LONG TERM GOAL #1   Title Patient will be able to ambulate >1000 feet with quad cane and supervision to improve community ambulation    Baseline 800' with quad cane outside with frequent rest breaks and min-mod A when pt fatigues    Time 8    Period Weeks    Status Revised    Target Date 06/24/20      PT LONG TERM GOAL #2   Title Patient will be able to perform TUG <15 seconds with RW to improve overall functional  mobility    Baseline 36 seconds with RW, 22 seconds with straight cane (04/18/20)    Time 8    Period Weeks    Status Revised    Target Date 06/24/20      PT LONG TERM GOAL #3   Title Patient will be able to go up and down 17 steps with use of one rail and cane, step to sequence with Supervision    Baseline 16 stairs with bilat rails and min A    Time 8    Period Weeks    Status Revised    Target Date 06/24/20      PT LONG TERM GOAL #4   Title Patient will demo improvement on Berg balance scale to at least 48/56 to improve functional balance and reduce fall risk    Baseline 41/56    Time 8    Period Weeks    Status Revised    Target Date 06/24/20                 Plan - 06/08/20 0808    Clinical Impression Statement Today's skilled session continued to focus on strengthening and gait/balance with straight cane. Pt with mild tremors this session, however able to continue with use of cane. The pt is progressing toward goals and should benefit from continued PT to progress toward unmet goals.    Personal Factors and Comorbidities Comorbidity 3+;Past/Current Experience;Time since onset of injury/illness/exacerbation;Transportation;Fitness;Age    Comorbidities CHF, ESRD, HTN    Examination-Activity Limitations Bathing;Bed Mobility;Caring for Others;Carry;Dressing;Hygiene/Grooming;Lift;Locomotion Level;Self Feeding;Sit;Squat;Stairs;Stand;Toileting;Transfers    Examination-Participation Restrictions Cleaning;Community Activity;Driving;Interpersonal Relationship;Laundry;Medication Management;Meal Prep;Shop;Yard Work    Stability/Clinical Decision Making Evolving/Moderate complexity    Rehab Potential Good    PT Frequency 2x / week    PT Duration 8 weeks  PT Treatment/Interventions ADLs/Self Care Home Management;Gait training;Stair training;Functional mobility training;Therapeutic activities;Therapeutic exercise;Balance training;Neuromuscular re-education;Patient/family  education;Manual techniques;Passive range of motion;Joint Manipulations    PT Next Visit Plan Work on endurance.  Work on turns with cane.  Work on stairs, especially descending.  Continue to work on gait outside with RW if weather allows, progress to gravel/grass as able.  Standing balance activities reducing UE support, SLS activities.  Stair and curb negotiation decreasing UE support (cane).  Working towards less w/c use.    PT [facility]- updated on 04/12/20    Consulted and Agree with Plan of Care Patient           Patient will benefit from skilled therapeutic intervention in order to improve the following deficits and impairments:  Abnormal gait,Decreased activity tolerance,Decreased balance,Decreased mobility,Decreased endurance,Decreased coordination,Decreased range of motion,Decreased safety awareness,Decreased strength,Difficulty walking,Impaired flexibility,Postural dysfunction,Impaired UE functional use,Impaired tone  Visit Diagnosis: Muscle weakness (generalized)  Unsteadiness on feet  Other abnormalities of gait and mobility     Problem List Patient Active Problem List   Diagnosis Date Noted  . 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 Gait abnormality 08/12/2019  . History of gout 06/17/2019  . Bowel incontinence 06/09/2019  . Anoxic brain injury ([place])   . History of cardiac arrest 04/27/2019  . History of non-ST elevation myocardial infarction (NSTEMI) 04/18/2019  . Morbid obesity ([place]) 04/14/2019  . Diabetic neuropathy ([place]) 04/02/2016  . Hypertension associated with diabetes ([place]) 10/03/2015  . HLD (hyperlipidemia) 09/06/2015  . End stage renal disease on dialysis ([place]) 08/31/2015  . DM (diabetes mellitus), type 2 with renal complications ([place]) 26/83/4196  . OSA treated with BiPAP 12/12/2013  . Coronary atherosclerosis 11/01/2009    Willow Ora, PTA, [facility] [address], [place], [place]  [address] [phone] 06/08/20, 9:26 AM   Name: Troy Wells MRN: [id number] Date of Birth: Feb 01, 1966

## 2020-06-09 DIAGNOSIS — Z992 Dependence on renal dialysis: Secondary | ICD-10-CM | POA: Diagnosis not present

## 2020-06-09 DIAGNOSIS — Z23 Encounter for immunization: Secondary | ICD-10-CM | POA: Diagnosis not present

## 2020-06-09 DIAGNOSIS — E1121 Type 2 diabetes mellitus with diabetic nephropathy: Secondary | ICD-10-CM | POA: Diagnosis not present

## 2020-06-09 DIAGNOSIS — D631 Anemia in chronic kidney disease: Secondary | ICD-10-CM | POA: Diagnosis not present

## 2020-06-09 DIAGNOSIS — N2581 Secondary hyperparathyroidism of renal origin: Secondary | ICD-10-CM | POA: Diagnosis not present

## 2020-06-09 DIAGNOSIS — N186 End stage renal disease: Secondary | ICD-10-CM | POA: Diagnosis not present

## 2020-06-12 DIAGNOSIS — Z992 Dependence on renal dialysis: Secondary | ICD-10-CM | POA: Diagnosis not present

## 2020-06-12 DIAGNOSIS — Z23 Encounter for immunization: Secondary | ICD-10-CM | POA: Diagnosis not present

## 2020-06-12 DIAGNOSIS — N186 End stage renal disease: Secondary | ICD-10-CM | POA: Diagnosis not present

## 2020-06-12 DIAGNOSIS — N2581 Secondary hyperparathyroidism of renal origin: Secondary | ICD-10-CM | POA: Diagnosis not present

## 2020-06-12 DIAGNOSIS — D631 Anemia in chronic kidney disease: Secondary | ICD-10-CM | POA: Diagnosis not present

## 2020-06-12 DIAGNOSIS — E1121 Type 2 diabetes mellitus with diabetic nephropathy: Secondary | ICD-10-CM | POA: Diagnosis not present

## 2020-06-13 ENCOUNTER — Encounter: Payer: Self-pay | Admitting: Physical Therapy

## 2020-06-13 ENCOUNTER — Ambulatory Visit: Payer: Medicare Other | Admitting: Physical Therapy

## 2020-06-13 ENCOUNTER — Other Ambulatory Visit: Payer: Self-pay

## 2020-06-13 DIAGNOSIS — R2689 Other abnormalities of gait and mobility: Secondary | ICD-10-CM

## 2020-06-13 DIAGNOSIS — R2681 Unsteadiness on feet: Secondary | ICD-10-CM

## 2020-06-13 DIAGNOSIS — M6281 Muscle weakness (generalized): Secondary | ICD-10-CM | POA: Diagnosis not present

## 2020-06-13 NOTE — Therapy (Signed)
[facility] [address] [place], [place], [address] Phone: [phone]   Fax:  [phone]  Physical Therapy Treatment  Patient Details  Name: Troy Wells MRN: [id number] Date of Birth: 07/22/1965 Referring Provider (PT): Dr. Andrena Mews   Encounter Date: 06/13/2020   PT End of Session - 06/13/20 0808    Visit Number 78    Number of Visits 45    Date for PT Re-Evaluation 06/24/20    Authorization Type Medicare parts A&B; KX modifier required now    Progress Note Due on Visit 24    PT Start Time 0802    PT Stop Time 0843    PT Time Calculation (min) 41 min    Activity Tolerance Patient tolerated treatment well    Behavior During Therapy [facility] for tasks assessed/performed           Past Medical History:  Diagnosis Date  . CARDIAC ARREST 11/01/2009   Qualifier: Diagnosis of  By: Selena Batten CMA, Jewel    . Coagulase negative Staphylococcus bacteremia 06/30/2019  . Colon polyps 01/02/2016   Colonoscopy July 2017 - One 3 mm polyp in the transverse colon, removed with a cold biopsy forceps. Resected and retrieved. - One 3 mm polyp in the rectum, removed with a cold biopsy forceps. Resected and retrieved. - Diverticulosis in the entire examined colon. - Non-bleeding internal hemorrhoids. - The examination was otherwise normal. - Significant looping which prolonged cecal    . Coronary artery disease 08/15/2009   with AMI  . Coronary atherosclerosis 11/01/2009   Qualifier: Diagnosis of  By: Selena Batten CMA, Jewel    . Dialysis patient ([place]) 09/27/2014   mon, wed, and fri  . Diverticulosis of colon without hemorrhage 01/02/2016   Colonoscopy July 2017 - One 3 mm polyp in the transverse colon, removed with a cold biopsy forceps. Resected and retrieved. - One 3 mm polyp in the rectum, removed with a cold biopsy forceps. Resected and retrieved. - Diverticulosis in the entire examined colon. - Non-bleeding internal  hemorrhoids. - The examination was otherwise normal. - Significant looping which prolonged cecal    . DM (diabetes mellitus), type 2 with renal complications ([place]) 00/37/0488  . Essential hypertension 11/01/2009   Qualifier: Diagnosis of  By: Selena Batten CMA, Jewel    . Gout 10/21/2018  . History of acute respiratory distress syndrome (ARDS) due to COVID-19 virus   . History of cardiac arrest 04/27/2019  . History of non-ST elevation myocardial infarction (NSTEMI) 04/18/2019  . history of respiratory arrest   . Hyperlipidemia   . Hypertension   . Mitral valve regurgitation 09/19/2015   Echo 09/2015   . 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 OSA treated with BiPAP 06/17/2010  . Protein-calorie malnutrition ([place]) 06/01/2019   Poor PO intake, only eating 50% of meals, could benefit from feeding tube if persistently poor PO intake.  . Pulmonary hypertension ([place]) 04/14/2019   RVSP 38 (TTE 2017) but repeat TTE March 2019 with RVSP 25 RAP in 2020: 32mhg  . Sleep apnea    cpap nightly    Past Surgical History:  Procedure Laterality Date  . AV FISTULA PLACEMENT Left 09/27/2014  . INSERTION OF ARTERIOVENOUS (AV) ARTEGRAFT ARM Left 04/02/2018   Procedure: INSERTION OF ARTERIOVENOUS (AV) ARTEGRAFT ARM LEFT UPPER ARM;  Surgeon: CWaynetta Sandy MD;  Location[place] [facility]  Service: Vascular;  Laterality: Left;  . INSERTION OF DIALYSIS CATHETER N/A 04/02/2018   Procedure: INSERTION OF DIALYSIS CATHETER, right internal jugular;  Surgeon: Waynetta Sandy, MD;  Location: [place] [facility];  Service: Vascular;  Laterality: N/A;  . NECK SURGERY     C6 & C7 replaced 30 yrs ago per pt  . REVISON OF ARTERIOVENOUS FISTULA Left 04/02/2018   Procedure: REVISION PLICATION OF ARTERIOVENOUS FISTULA ARM;  Surgeon: Waynetta Sandy, MD;  Location: [place];  Service: Vascular;  Laterality: Left;  . WISDOM TOOTH EXTRACTION      There were no vitals filed for this visit.   Subjective Assessment - 06/13/20 0805     Subjective Using walking only when at home, no wheelchair. Only using the wheelchair when he has to leave to go somewhere on the bus. Uses the walker when he is riding with other in a car. No falls. Has not started using the cane at home as it is at his house, not his daughters. Only goes to his house on weekends and does not feel comfortable using the cane while alone right now.    Pertinent History CHF, HTN, ERSD    Limitations Standing;Walking;Writing;House hold activities;Lifting    How long can you sit comfortably? supported sitting no problem    How long can you stand comfortably? <5 min    How long can you walk comfortably? 20 feet with walker    Patient Stated Goals improve walking get stronger    Currently in Pain? No/denies                [facility] Adult PT Treatment/Exercise - 06/13/20 0808      Transfers   Transfers Sit to Stand;Stand to Sit    Sit to Stand 6: Modified independent (Device/Increase time)    Stand to Sit 6: Modified independent (Device/Increase time)      Ambulation/Gait   Ambulation/Gait Yes    Ambulation/Gait Assistance 5: Supervision;4: Min guard    Ambulation/Gait Assistance Details around gym with session    Assistive device Straight cane   with rubber quad tip   Gait Pattern Step-through pattern;Decreased step length - right;Decreased step length - left;Decreased stride length;Poor foot clearance - left;Poor foot clearance - right;Trunk flexed;Step-to pattern    Ambulation Surface Level;Indoor      Knee/Hip Exercises: Aerobic   Other Aerobic Scifit with bilateral UE/LE's level 5.5 for 8 minutes with goal >/= 40 rpm for strengthening, activity tolerance and working on reciprocal movements.      Knee/Hip Exercises: Standing   Forward Step Up Both;2 sets;5 reps;Hand Hold: 2;Step Height: 6";Limitations    Forward Step Up Limitations with light UE support on bars, lifting non stance LE into air/back down. cues for tall posture.                Balance Exercises - 06/13/20 1007      Balance Exercises: Standing   Rockerboard Anterior/posterior;Lateral;Head turns;EO;EC;30 seconds;Other reps (comment);Intermittent UE support;Limitations    Rockerboard Limitations performed both ways on balance board: rocking the board with emphasis on tall posture with EO, progressing to EC for ~10 reps each, then holding the board steady for EC 30 sec's x 3 reps, progressing to EC head movements left<>right, up<>down for 8-10 reps each. intermittent touch to bars for balance with min guard to min assist for balance along with cues for tall posture.               PT Short Term Goals - 05/25/20 0818      PT SHORT TERM GOAL #1   Title Pt will ambulate x 500' outside over pavement with  quad cane and supervision    Baseline 500' indoors with RW and cane with therapist supervision-min A; tremors    Time 4    Period Weeks    Status Not Met    Target Date 05/25/20      PT SHORT TERM GOAL #2   Title Patient will be able to ascend and descend 8 stairs with step to sequence with one rail and cane with supervision    Time 4    Period Weeks    Status Not Met    Target Date 05/25/20      PT SHORT TERM GOAL #3   Title Pt will increase BERG score to >/= 44/56    Baseline 05/23/20: 44/56 scored today, met with session today    Status Achieved    Target Date 05/25/20      PT SHORT TERM GOAL #4   Title Pt will decrease TUG with cane to </= 18 seconds with supervision    Baseline 16.9 seconds with cane; no tremors.    Status Achieved    Target Date 05/25/20             PT Long Term Goals - 04/25/20 1014      PT LONG TERM GOAL #1   Title Patient will be able to ambulate >1000 feet with quad cane and supervision to improve community ambulation    Baseline 800' with quad cane outside with frequent rest breaks and min-mod A when pt fatigues    Time 8    Period Weeks    Status Revised    Target Date 06/24/20      PT LONG TERM GOAL #2   Title  Patient will be able to perform TUG <15 seconds with RW to improve overall functional mobility    Baseline 36 seconds with RW, 22 seconds with straight cane (04/18/20)    Time 8    Period Weeks    Status Revised    Target Date 06/24/20      PT LONG TERM GOAL #3   Title Patient will be able to go up and down 17 steps with use of one rail and cane, step to sequence with Supervision    Baseline 16 stairs with bilat rails and min A    Time 8    Period Weeks    Status Revised    Target Date 06/24/20      PT LONG TERM GOAL #4   Title Patient will demo improvement on Berg balance scale to at least 48/56 to improve functional balance and reduce fall risk    Baseline 41/56    Time 8    Period Weeks    Status Revised    Target Date 06/24/20                 Plan - 06/13/20 0808    Clinical Impression Statement Today's skilled session continued to focus on strengthening, gait with cane and balance training. Pt reported LE fatigue at end of session, however able to use cane for gait back to chair with min guard assist. The pt is progressing and should benefit from continued PT to progress toward unmet goals.    Personal Factors and Comorbidities Comorbidity 3+;Past/Current Experience;Time since onset of injury/illness/exacerbation;Transportation;Fitness;Age    Comorbidities CHF, ESRD, HTN    Examination-Activity Limitations Bathing;Bed Mobility;Caring for Others;Carry;Dressing;Hygiene/Grooming;Lift;Locomotion Level;Self Feeding;Sit;Squat;Stairs;Stand;Toileting;Transfers    Examination-Participation Restrictions Cleaning;Community Activity;Driving;Interpersonal Relationship;Laundry;Medication Management;Meal Prep;Shop;Yard Work    [employer] Evolving/Moderate complexity  Rehab Potential Good    PT Frequency 2x / week    PT Duration 8 weeks    PT Treatment/Interventions ADLs/Self Care Home Management;Gait training;Stair training;Functional mobility  training;Therapeutic activities;Therapeutic exercise;Balance training;Neuromuscular re-education;Patient/family education;Manual techniques;Passive range of motion;Joint Manipulations    PT Next Visit Plan Work on endurance.  Work on turns with cane.  Work on stairs, especially descending.  Continue to work on gait outside with RW if weather allows, progress to gravel/grass as able.  Standing balance activities reducing UE support, SLS activities.  Stair and curb negotiation decreasing UE support (cane).  Working towards less w/c use.    PT [place]- updated on 04/12/20    Consulted and Agree with Plan of Care Patient           Patient will benefit from skilled therapeutic intervention in order to improve the following deficits and impairments:  Abnormal gait,Decreased activity tolerance,Decreased balance,Decreased mobility,Decreased endurance,Decreased coordination,Decreased range of motion,Decreased safety awareness,Decreased strength,Difficulty walking,Impaired flexibility,Postural dysfunction,Impaired UE functional use,Impaired tone  Visit Diagnosis: Muscle weakness (generalized)  Unsteadiness on feet  Other abnormalities of gait and mobility     Problem List Patient Active Problem List   Diagnosis Date Noted  . 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 Gait abnormality 08/12/2019  . History of gout 06/17/2019  . Bowel incontinence 06/09/2019  . Anoxic brain injury ([place])   . History of cardiac arrest 04/27/2019  . History of non-ST elevation myocardial infarction (NSTEMI) 04/18/2019  . Morbid obesity ([place]) 04/14/2019  . Diabetic neuropathy ([place]) 04/02/2016  . Hypertension associated with diabetes ([place]) 10/03/2015  . HLD (hyperlipidemia) 09/06/2015  . End stage renal disease on dialysis ([place]) 08/31/2015  . DM (diabetes mellitus), type 2 with renal complications ([place]) 04/88/8916  . OSA treated with BiPAP 12/12/2013  . Coronary atherosclerosis 11/01/2009     Willow Ora, PTA, [facility] [address], [place], [place] [address] [phone] 06/13/20, 10:11 AM   Name: Findley Vi MRN: [id number] Date of Birth: July 27, 1965

## 2020-06-14 DIAGNOSIS — N186 End stage renal disease: Secondary | ICD-10-CM | POA: Diagnosis not present

## 2020-06-14 DIAGNOSIS — E1121 Type 2 diabetes mellitus with diabetic nephropathy: Secondary | ICD-10-CM | POA: Diagnosis not present

## 2020-06-14 DIAGNOSIS — D631 Anemia in chronic kidney disease: Secondary | ICD-10-CM | POA: Diagnosis not present

## 2020-06-14 DIAGNOSIS — Z23 Encounter for immunization: Secondary | ICD-10-CM | POA: Diagnosis not present

## 2020-06-14 DIAGNOSIS — Z992 Dependence on renal dialysis: Secondary | ICD-10-CM | POA: Diagnosis not present

## 2020-06-14 DIAGNOSIS — N2581 Secondary hyperparathyroidism of renal origin: Secondary | ICD-10-CM | POA: Diagnosis not present

## 2020-06-15 ENCOUNTER — Other Ambulatory Visit: Payer: Self-pay

## 2020-06-15 ENCOUNTER — Ambulatory Visit: Payer: Medicare Other | Admitting: Physical Therapy

## 2020-06-15 ENCOUNTER — Encounter: Payer: Self-pay | Admitting: Physical Therapy

## 2020-06-15 DIAGNOSIS — R2681 Unsteadiness on feet: Secondary | ICD-10-CM | POA: Diagnosis not present

## 2020-06-15 DIAGNOSIS — R2689 Other abnormalities of gait and mobility: Secondary | ICD-10-CM | POA: Diagnosis not present

## 2020-06-15 DIAGNOSIS — M6281 Muscle weakness (generalized): Secondary | ICD-10-CM | POA: Diagnosis not present

## 2020-06-15 NOTE — Therapy (Signed)
[facility] [address] [place], [place], [address] Phone: [phone]   Fax:  [phone]  Physical Therapy Treatment  Patient Details  Name: Troy Wells MRN: [id number] Date of Birth: July 09, 1965 Referring Provider (PT): Dr. Andrena Mews   Encounter Date: 06/15/2020   PT End of Session - 06/15/20 0806    Visit Number 48    Number of Visits 48    Date for PT Re-Evaluation 06/24/20    Authorization Type Medicare parts A&B; KX modifier required now    Progress Note Due on Visit 44    PT Start Time 0802    PT Stop Time 0841    PT Time Calculation (min) 39 min    Activity Tolerance Patient tolerated treatment well;Patient limited by fatigue    Behavior During Therapy [facility] for tasks assessed/performed           Past Medical History:  Diagnosis Date  . CARDIAC ARREST 11/01/2009   Qualifier: Diagnosis of  By: Selena Batten CMA, Jewel    . Coagulase negative Staphylococcus bacteremia 06/30/2019  . Colon polyps 01/02/2016   Colonoscopy July 2017 - One 3 mm polyp in the transverse colon, removed with a cold biopsy forceps. Resected and retrieved. - One 3 mm polyp in the rectum, removed with a cold biopsy forceps. Resected and retrieved. - Diverticulosis in the entire examined colon. - Non-bleeding internal hemorrhoids. - The examination was otherwise normal. - Significant looping which prolonged cecal    . Coronary artery disease 08/15/2009   with AMI  . Coronary atherosclerosis 11/01/2009   Qualifier: Diagnosis of  By: Selena Batten CMA, Jewel    . Dialysis patient ([place]) 09/27/2014   mon, wed, and fri  . Diverticulosis of colon without hemorrhage 01/02/2016   Colonoscopy July 2017 - One 3 mm polyp in the transverse colon, removed with a cold biopsy forceps. Resected and retrieved. - One 3 mm polyp in the rectum, removed with a cold biopsy forceps. Resected and retrieved. - Diverticulosis in the entire examined colon.  - Non-bleeding internal hemorrhoids. - The examination was otherwise normal. - Significant looping which prolonged cecal    . DM (diabetes mellitus), type 2 with renal complications ([place]) 97/94/8016  . Essential hypertension 11/01/2009   Qualifier: Diagnosis of  By: Selena Batten CMA, Jewel    . Gout 10/21/2018  . History of acute respiratory distress syndrome (ARDS) due to COVID-19 virus   . History of cardiac arrest 04/27/2019  . History of non-ST elevation myocardial infarction (NSTEMI) 04/18/2019  . history of respiratory arrest   . Hyperlipidemia   . Hypertension   . Mitral valve regurgitation 09/19/2015   Echo 09/2015   . 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 OSA treated with BiPAP 06/17/2010  . Protein-calorie malnutrition ([place]) 06/01/2019   Poor PO intake, only eating 50% of meals, could benefit from feeding tube if persistently poor PO intake.  . Pulmonary hypertension ([place]) 04/14/2019   RVSP 38 (TTE 2017) but repeat TTE March 2019 with RVSP 25 RAP in 2020: 82mhg  . Sleep apnea    cpap nightly    Past Surgical History:  Procedure Laterality Date  . AV FISTULA PLACEMENT Left 09/27/2014  . INSERTION OF ARTERIOVENOUS (AV) ARTEGRAFT ARM Left 04/02/2018   Procedure: INSERTION OF ARTERIOVENOUS (AV) ARTEGRAFT ARM LEFT UPPER ARM;  Surgeon: CWaynetta Sandy MD;  Location: [facility]  Service: Vascular;  Laterality: Left;  . INSERTION OF DIALYSIS CATHETER N/A 04/02/2018   Procedure: INSERTION OF DIALYSIS CATHETER, right  internal jugular;  Surgeon: Waynetta Sandy, MD;  Location: [place];  Service: Vascular;  Laterality: N/A;  . NECK SURGERY     C6 & C7 replaced 30 yrs ago per pt  . REVISON OF ARTERIOVENOUS FISTULA Left 04/02/2018   Procedure: REVISION PLICATION OF ARTERIOVENOUS FISTULA ARM;  Surgeon: Waynetta Sandy, MD;  Location: [place];  Service: Vascular;  Laterality: Left;  . WISDOM TOOTH EXTRACTION      There were no vitals filed for this visit.   Subjective Assessment  - 06/15/20 0805    Subjective No new compaints. No falls or pain to report. Has not had time to get to his house to get his cane, hoping his brother can go by this weekend for him.    Pertinent History CHF, HTN, ERSD    How long can you sit comfortably? supported sitting no problem    How long can you stand comfortably? <5 min    How long can you walk comfortably? 20 feet with walker    Patient Stated Goals improve walking get stronger    Currently in Pain? No/denies    Pain Score 0-No pain                [facility] Adult PT Treatment/Exercise - 06/15/20 0807      Transfers   Transfers Sit to Stand;Stand to Sit    Sit to Stand 6: Modified independent (Device/Increase time)    Stand to Sit 6: Modified independent (Device/Increase time)      Ambulation/Gait   Ambulation/Gait Yes    Ambulation/Gait Assistance 5: Supervision;4: Min guard    Ambulation/Gait Assistance Details around gym with session. Min guard assist for safety as pt fatigues due to increased tremors.    Assistive device Straight cane   with rubber quad tip   Gait Pattern Step-through pattern;Decreased step length - right;Decreased step length - left;Decreased stride length;Poor foot clearance - left;Poor foot clearance - right;Trunk flexed;Step-to pattern    Ambulation Surface Level;Indoor    Stairs Yes    Stairs Assistance 5: Supervision;4: Min guard    Stairs Assistance Details (indicate cue type and reason) 1st rep with bil rails with supervision. then advanced to single rail on right/cane on left to ascend, reverse with descending with min guard assist. increased time needed with descending due to tremors/increased time for initiation of stepping. HR up to 119 after this rep. Decreased with rest break. 3rd rep with single rail/cane with min guard to ascend, one episode with descending needing min assist due to increased tremors with descending to next to last step, then back to min guard for final step. HR 108 after this  rep. Decreased with rest break.    Stair Management Technique Two rails;One rail Right;Step to pattern;Forwards    Number of Stairs 4    Height of Stairs 6      Knee/Hip Exercises: Aerobic   Other Aerobic Scifit with bilateral UE/LE's level 5.5 for 8 minutes with goal >/= 40 rpm for strengthening, activity tolerance and working on reciprocal movements.                    PT Short Term Goals - 05/25/20 0818      PT SHORT TERM GOAL #1   Title Pt will ambulate x 500' outside over pavement with quad cane and supervision    Baseline 500' indoors with RW and cane with therapist supervision-min A; tremors    Time 4    Period  Weeks    Status Not Met    Target Date 05/25/20      PT SHORT TERM GOAL #2   Title Patient will be able to ascend and descend 8 stairs with step to sequence with one rail and cane with supervision    Time 4    Period Weeks    Status Not Met    Target Date 05/25/20      PT SHORT TERM GOAL #3   Title Pt will increase BERG score to >/= 44/56    Baseline 05/23/20: 44/56 scored today, met with session today    Status Achieved    Target Date 05/25/20      PT SHORT TERM GOAL #4   Title Pt will decrease TUG with cane to </= 18 seconds with supervision    Baseline 16.9 seconds with cane; no tremors.    Status Achieved    Target Date 05/25/20             PT Long Term Goals - 04/25/20 1014      PT LONG TERM GOAL #1   Title Patient will be able to ambulate >1000 feet with quad cane and supervision to improve community ambulation    Baseline 800' with quad cane outside with frequent rest breaks and min-mod A when pt fatigues    Time 8    Period Weeks    Status Revised    Target Date 06/24/20      PT LONG TERM GOAL #2   Title Patient will be able to perform TUG <15 seconds with RW to improve overall functional mobility    Baseline 36 seconds with RW, 22 seconds with straight cane (04/18/20)    Time 8    Period Weeks    Status Revised    Target Date  06/24/20      PT LONG TERM GOAL #3   Title Patient will be able to go up and down 17 steps with use of one rail and cane, step to sequence with Supervision    Baseline 16 stairs with bilat rails and min A    Time 8    Period Weeks    Status Revised    Target Date 06/24/20      PT LONG TERM GOAL #4   Title Patient will demo improvement on Berg balance scale to at least 48/56 to improve functional balance and reduce fall risk    Baseline 41/56    Time 8    Period Weeks    Status Revised    Target Date 06/24/20                 Plan - 06/15/20 0807    Clinical Impression Statement Today's skilled session continued to focus on strengthening and use of cane with gait and initiated use of cane with rail on stairs. Pt with increased tremors with use of cane/rail vs bil rails with stairs, however tremors decreased with second rep on stairs with rail/cane combo. The pt is progressing toward goals and should benefit from continued PT to progress toward unmet goals.    Personal Factors and Comorbidities Comorbidity 3+;Past/Current Experience;Time since onset of injury/illness/exacerbation;Transportation;Fitness;Age    Comorbidities CHF, ESRD, HTN    Examination-Activity Limitations Bathing;Bed Mobility;Caring for Others;Carry;Dressing;Hygiene/Grooming;Lift;Locomotion Level;Self Feeding;Sit;Squat;Stairs;Stand;Toileting;Transfers    Examination-Participation Restrictions Cleaning;Community Activity;Driving;Interpersonal Relationship;Laundry;Medication Management;Meal Prep;Shop;Yard Work    Stability/Clinical Decision Making Evolving/Moderate complexity    Rehab Potential Good    PT Frequency 2x / week    PT  Duration 8 weeks    PT Treatment/Interventions ADLs/Self Care Home Management;Gait training;Stair training;Functional mobility training;Therapeutic activities;Therapeutic exercise;Balance training;Neuromuscular re-education;Patient/family education;Manual techniques;Passive range of  motion;Joint Manipulations    PT Next Visit Plan Begin to check LTGs for recert.    PT [place]- updated on 04/12/20    Consulted and Agree with Plan of Care Patient           Patient will benefit from skilled therapeutic intervention in order to improve the following deficits and impairments:  Abnormal gait,Decreased activity tolerance,Decreased balance,Decreased mobility,Decreased endurance,Decreased coordination,Decreased range of motion,Decreased safety awareness,Decreased strength,Difficulty walking,Impaired flexibility,Postural dysfunction,Impaired UE functional use,Impaired tone  Visit Diagnosis: Muscle weakness (generalized)  Unsteadiness on feet  Other abnormalities of gait and mobility     Problem List Patient Active Problem List   Diagnosis Date Noted  . 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 Gait abnormality 08/12/2019  . History of gout 06/17/2019  . Bowel incontinence 06/09/2019  . Anoxic brain injury ([place])   . History of cardiac arrest 04/27/2019  . History of non-ST elevation myocardial infarction (NSTEMI) 04/18/2019  . Morbid obesity ([place]) 04/14/2019  . Diabetic neuropathy ([place]) 04/02/2016  . Hypertension associated with diabetes ([place]) 10/03/2015  . HLD (hyperlipidemia) 09/06/2015  . End stage renal disease on dialysis ([place]) 08/31/2015  . DM (diabetes mellitus), type 2 with renal complications ([place]) 18/36/7255  . OSA treated with BiPAP 12/12/2013  . Coronary atherosclerosis 11/01/2009   Willow Ora, PTA, [facility] [address], [place], [place] [address] [phone] 06/15/20, 7:58 PM   Name: Troy Wells MRN: [id number] Date of Birth: 1965-07-28

## 2020-06-16 DIAGNOSIS — N2581 Secondary hyperparathyroidism of renal origin: Secondary | ICD-10-CM | POA: Diagnosis not present

## 2020-06-16 DIAGNOSIS — E1121 Type 2 diabetes mellitus with diabetic nephropathy: Secondary | ICD-10-CM | POA: Diagnosis not present

## 2020-06-16 DIAGNOSIS — N186 End stage renal disease: Secondary | ICD-10-CM | POA: Diagnosis not present

## 2020-06-16 DIAGNOSIS — Z23 Encounter for immunization: Secondary | ICD-10-CM | POA: Diagnosis not present

## 2020-06-16 DIAGNOSIS — Z992 Dependence on renal dialysis: Secondary | ICD-10-CM | POA: Diagnosis not present

## 2020-06-16 DIAGNOSIS — D631 Anemia in chronic kidney disease: Secondary | ICD-10-CM | POA: Diagnosis not present

## 2020-06-19 DIAGNOSIS — Z992 Dependence on renal dialysis: Secondary | ICD-10-CM | POA: Diagnosis not present

## 2020-06-19 DIAGNOSIS — N2581 Secondary hyperparathyroidism of renal origin: Secondary | ICD-10-CM | POA: Diagnosis not present

## 2020-06-19 DIAGNOSIS — R519 Headache, unspecified: Secondary | ICD-10-CM | POA: Diagnosis not present

## 2020-06-19 DIAGNOSIS — D689 Coagulation defect, unspecified: Secondary | ICD-10-CM | POA: Diagnosis not present

## 2020-06-19 DIAGNOSIS — L299 Pruritus, unspecified: Secondary | ICD-10-CM | POA: Diagnosis not present

## 2020-06-19 DIAGNOSIS — E1121 Type 2 diabetes mellitus with diabetic nephropathy: Secondary | ICD-10-CM | POA: Diagnosis not present

## 2020-06-19 DIAGNOSIS — N186 End stage renal disease: Secondary | ICD-10-CM | POA: Diagnosis not present

## 2020-06-19 DIAGNOSIS — D688 Other specified coagulation defects: Secondary | ICD-10-CM | POA: Diagnosis not present

## 2020-06-19 DIAGNOSIS — R52 Pain, unspecified: Secondary | ICD-10-CM | POA: Diagnosis not present

## 2020-06-19 DIAGNOSIS — D631 Anemia in chronic kidney disease: Secondary | ICD-10-CM | POA: Diagnosis not present

## 2020-06-20 ENCOUNTER — Other Ambulatory Visit: Payer: Self-pay

## 2020-06-20 ENCOUNTER — Ambulatory Visit: Payer: Medicare Other | Attending: Family Medicine | Admitting: Physical Therapy

## 2020-06-20 ENCOUNTER — Encounter: Payer: Self-pay | Admitting: Physical Therapy

## 2020-06-20 VITALS — BP 129/84 | HR 101

## 2020-06-20 DIAGNOSIS — M6281 Muscle weakness (generalized): Secondary | ICD-10-CM | POA: Insufficient documentation

## 2020-06-20 DIAGNOSIS — R2689 Other abnormalities of gait and mobility: Secondary | ICD-10-CM | POA: Diagnosis not present

## 2020-06-20 DIAGNOSIS — R2681 Unsteadiness on feet: Secondary | ICD-10-CM | POA: Insufficient documentation

## 2020-06-20 NOTE — Therapy (Signed)
[facility] [address] [place], [place], [address] Phone: [phone]   Fax:  [phone]  Physical Therapy Treatment and 10th Visit Progress Note  Patient Details  Name: Troy Wells MRN: [id number] Date of Birth: 1965/12/11 Referring Provider (PT): Dr. Andrena Mews   Encounter Date: 06/20/2020   Progress Note Reporting Period 05/25/2020 to 06/20/2020  See note below for Objective Data and Assessment of Progress/Goals.      PT End of Session - 06/20/20 0856    Visit Number 48   10th visit note performed on visit 49   Number of Visits 65    Date for PT Re-Evaluation 08/19/20    Authorization Type Medicare parts A&B; KX modifier required now, 10th visit progress note    Progress Note Due on Visit 88    PT Start Time 0805    PT Stop Time 0845    PT Time Calculation (min) 40 min    Activity Tolerance Patient limited by fatigue    Behavior During Therapy [facility] for tasks assessed/performed           Past Medical History:  Diagnosis Date  . CARDIAC ARREST 11/01/2009   Qualifier: Diagnosis of  By: Selena Batten CMA, Jewel    . Coagulase negative Staphylococcus bacteremia 06/30/2019  . Colon polyps 01/02/2016   Colonoscopy July 2017 - One 3 mm polyp in the transverse colon, removed with a cold biopsy forceps. Resected and retrieved. - One 3 mm polyp in the rectum, removed with a cold biopsy forceps. Resected and retrieved. - Diverticulosis in the entire examined colon. - Non-bleeding internal hemorrhoids. - The examination was otherwise normal. - Significant looping which prolonged cecal    . Coronary artery disease 08/15/2009   with AMI  . Coronary atherosclerosis 11/01/2009   Qualifier: Diagnosis of  By: Selena Batten CMA, Jewel    . Dialysis patient ([place]) 09/27/2014   mon, wed, and fri  . Diverticulosis of colon without hemorrhage 01/02/2016   Colonoscopy July 2017 - One 3 mm polyp in the transverse colon,  removed with a cold biopsy forceps. Resected and retrieved. - One 3 mm polyp in the rectum, removed with a cold biopsy forceps. Resected and retrieved. - Diverticulosis in the entire examined colon. - Non-bleeding internal hemorrhoids. - The examination was otherwise normal. - Significant looping which prolonged cecal    . DM (diabetes mellitus), type 2 with renal complications ([place]) 45/62/5638  . Essential hypertension 11/01/2009   Qualifier: Diagnosis of  By: Selena Batten CMA, Jewel    . Gout 10/21/2018  . History of acute respiratory distress syndrome (ARDS) due to COVID-19 virus   . History of cardiac arrest 04/27/2019  . History of non-ST elevation myocardial infarction (NSTEMI) 04/18/2019  . history of respiratory arrest   . Hyperlipidemia   . Hypertension   . Mitral valve regurgitation 09/19/2015   Echo 09/2015   . 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 OSA treated with BiPAP 06/17/2010  . Protein-calorie malnutrition ([place]) 06/01/2019   Poor PO intake, only eating 50% of meals, could benefit from feeding tube if persistently poor PO intake.  . Pulmonary hypertension ([place]) 04/14/2019   RVSP 38 (TTE 2017) but repeat TTE March 2019 with RVSP 25 RAP in 2020: 54mhg  . Sleep apnea    cpap nightly    Past Surgical History:  Procedure Laterality Date  . AV FISTULA PLACEMENT Left 09/27/2014  . INSERTION OF ARTERIOVENOUS (AV) ARTEGRAFT ARM Left 04/02/2018   Procedure: INSERTION OF ARTERIOVENOUS (AV)  ARTEGRAFT ARM LEFT UPPER ARM;  Surgeon: Waynetta Sandy, MD;  Location: [facility];  Service: Vascular;  Laterality: Left;  . INSERTION OF DIALYSIS CATHETER N/A 04/02/2018   Procedure: INSERTION OF DIALYSIS CATHETER, right internal jugular;  Surgeon: Waynetta Sandy, MD;  Location: [facility];  Service: Vascular;  Laterality: N/A;  . NECK SURGERY     C6 & C7 replaced 30 yrs ago per pt  . REVISON OF ARTERIOVENOUS FISTULA Left 04/02/2018   Procedure: REVISION PLICATION OF ARTERIOVENOUS FISTULA ARM;   Surgeon: Waynetta Sandy, MD;  Location: [facility];  Service: Vascular;  Laterality: Left;  . WISDOM TOOTH EXTRACTION      Vitals:   06/20/20 0838  BP: 129/84  Pulse: (!) 101  SpO2: 100%     Subjective Assessment - 06/20/20 0805    Subjective No issues to report, no falls.    Pertinent History CHF, HTN, ERSD    How long can you sit comfortably? supported sitting no problem    How long can you stand comfortably? <5 min    How long can you walk comfortably? 20 feet with walker    Patient Stated Goals improve walking get stronger    Currently in Pain? No/denies              [facility] PT Assessment - 06/20/20 9937      Assessment   Medical Diagnosis Weakness    Referring Provider (PT) Dr. Andrena Mews    Onset Date/Surgical Date 12/21/19    Hand Dominance Right    Prior Therapy inpatient, subacute, home health      Precautions   Precautions Fall      Home Environment   Additional Comments Pt currently living with his daughter but goes to his house on the weekends.  Goal is to return to his house permanently; pt lives alone.      Prior Function   Level of Independence Requires assistive device for independence;Needs assistance with ADLs;Needs assistance with homemaking;Needs assistance with gait;Needs assistance with transfers    Vocation Unemployed;Other (comment)   was a truck Careers adviser, fishing, working out      Observation/Other [employer] on Therapeutic Outcomes Human resources officer)  N/A      Transfers   Transfers Sit to Stand;Stand to [employer] Transfers    Sit to Stand 6: Modified independent (Device/Increase time)    Stand to Sit 6: Modified independent (Device/Increase time)    Stand Pivot Transfers 6: Modified independent (Device/Increase time)      Ambulation/Gait   Ambulation/Gait Yes    Ambulation/Gait Assistance 5: Supervision    Ambulation/Gait Assistance Details 3 laps with cane but pt began to feel tightness in R knee and LE  weakness, switched to RW for final two laps.  After performing 5 laps pt reported feeling hot and nauseous.  BP assessed; WFL.  Pt reports only eating chips before therapy.  With seated rest break and cool wash cloth symptoms settled    Ambulation Distance (Feet) 575 Feet    Assistive device Straight cane;Rolling walker    Gait Pattern Step-through pattern;Decreased stride length;Trunk flexed;Decreased stance time - right;Decreased hip/knee flexion - right    Ambulation Surface Level;Indoor    Stairs Yes    Stairs Assistance 5: Supervision    Stairs Assistance Details (indicate cue type and reason) R rail and cane in L to ascend, rail in LUE and cane in R to descend.  Pt leans heavily on RLE  when descending; pt reports at home he leans against the wall for support.  Pt's goal for when he transitions home is to walk up/down stairs with rail only; will have to purchase another cane and keep one downstairs and one upstairs.    Stair Management Technique One rail Right;One rail Left;Step to pattern;Forwards;With cane    Number of Stairs 12    Height of Stairs 6    Gait Comments not able to do outdoor gait today due to weather      Berg Balance Test   Sit to Stand Able to stand without using hands and stabilize independently    Standing Unsupported Able to stand safely 2 minutes    Sitting with Back Unsupported but Feet Supported on Floor or Stool Able to sit safely and securely 2 minutes    Stand to Sit Sits safely with minimal use of hands    Transfers Able to transfer safely, minor use of hands      Timed Up and Go Test   TUG Normal TUG    Normal TUG (seconds) 15    TUG Comments with cane with quad tip; supervision                                 PT Education - 06/20/20 0856    Education Details progress towards goals, areas to continue to address and focus on stair negotiation    Person(s) Educated Patient    Methods Explanation    Comprehension Verbalized  understanding            PT Short Term Goals - 05/25/20 0818      PT SHORT TERM GOAL #1   Title Pt will ambulate x 500' outside over pavement with quad cane and supervision    Baseline 500' indoors with RW and cane with therapist supervision-min A; tremors    Time 4    Period Weeks    Status Not Met    Target Date 05/25/20      PT SHORT TERM GOAL #2   Title Patient will be able to ascend and descend 8 stairs with step to sequence with one rail and cane with supervision    Time 4    Period Weeks    Status Not Met    Target Date 05/25/20      PT SHORT TERM GOAL #3   Title Pt will increase BERG score to >/= 44/56    Baseline 05/23/20: 44/56 scored today, met with session today    Status Achieved    Target Date 05/25/20      PT SHORT TERM GOAL #4   Title Pt will decrease TUG with cane to </= 18 seconds with supervision    Baseline 16.9 seconds with cane; no tremors.    Status Achieved    Target Date 05/25/20             PT Long Term Goals - 06/20/20 0858      PT LONG TERM GOAL #1   Title Patient will be able to ambulate >1000 feet with quad cane and supervision to improve community ambulation    Baseline >500' with combination of cane and RW as LE fatigue    Time 8    Period Weeks    Status On-going      PT LONG TERM GOAL #2   Title Patient will be able to perform TUG <15 seconds with RW  to improve overall functional mobility    Baseline 15 seconds with cane    Time 8    Period Weeks    Status Achieved      PT LONG TERM GOAL #3   Title Patient will be able to go up and down 17 steps with use of one rail and cane, step to sequence with Supervision    Baseline 12 with one rail, cane and supervision    Time 8    Period Weeks    Status Partially Met      PT LONG TERM GOAL #4   Title Patient will demo improvement on Berg balance scale to at least 48/56 to improve functional balance and reduce fall risk    Baseline 41/56    Time 8    Period Weeks    Status  On-going          New goals for recertification:  PT Short Term Goals - 06/20/20 0915      PT SHORT TERM GOAL #1   Title Pt will ambulate x 800' outside over pavement with quad cane and supervision    Time 4    Period Weeks    Status Revised    Target Date 07/20/20      PT SHORT TERM GOAL #2   Title Patient will be able to ascend and descend 8 stairs with ALTERNATING sequence with one rail and cane with supervision    Time 4    Period Weeks    Status Revised    Target Date 07/20/20      PT SHORT TERM GOAL #3   Title Pt will increase BERG score to >/= 48/56    Time 4    Period Weeks    Status Revised    Target Date 07/20/20      PT SHORT TERM GOAL #4   Title Pt will decrease TUG with cane to </= 13 seconds with supervision    Baseline 15 seconds with cane    Time 4    Period Weeks    Status Revised    Target Date 07/20/20           PT Long Term Goals - 06/20/20 0917      PT LONG TERM GOAL #1   Title Patient will be able to ambulate >1000 feet with quad cane and supervision to improve community ambulation    Time 8    Period Weeks    Status Revised    Target Date 08/19/20      PT LONG TERM GOAL #2   Title Pt will report ability to get on and off transportation and ambulate into clinic with LRAD MOD I (no w/c)    Time 8    Period Weeks    Status Revised    Target Date 08/19/20      PT LONG TERM GOAL #3   Title Patient will be able to go up and down 17 steps with use of one rail with step to sequence with Supervision    Baseline (pt reports he uses one rail and wall to descend; wants to keep a cane at the top and bottom of his stairs)    Time 8    Period Weeks    Status Revised    Target Date 08/19/20      PT LONG TERM GOAL #4   Title Patient will demo improvement on Berg balance scale to at least 50/56 to improve functional balance and reduce fall  risk    Time 8    Period Weeks    Status Revised    Target Date 08/19/20              Plan -  06/20/20 0859    Clinical Impression Statement Treatment session focused on progress towards LTG.  Pt is making good progress and has met 1 LTG, partially met 1 LTG.  2 goals ongoing - unable to fully re-assess BERG today due to time constraints and unable to ambulate outdoors today due to weather/safety.  Pt was able to perform TUG safely with cane in 15 seconds; pt has made progress with stairs and was able to negotiate 12 with one rail and cane with supervision but LE fatigued and pt unable to perform full 17 stairs.  Pt also leans heavily to the R when descending due to leaning against a wall at home; pt would like to continue to use wall for support and keep a cane upstairs and downstairs.  Pt will benefit from continued skilled PT services to address ongoing impairments in cardiopulmonary endurance, LE strength, dynamic standing balance and safety with gait and stair negotiation to maximize functional mobility independence and reduce falls risk to allow pt to return home independently.    Personal Factors and Comorbidities Comorbidity 3+;Past/Current Experience;Time since onset of injury/illness/exacerbation;Transportation;Fitness;Age    Comorbidities CHF, ESRD, HTN    Examination-Activity Limitations Bathing;Bed Mobility;Caring for Others;Carry;Dressing;Hygiene/Grooming;Lift;Locomotion Level;Self Feeding;Sit;Squat;Stairs;Stand;Toileting;Transfers    Examination-Participation Restrictions Cleaning;Community Activity;Driving;Interpersonal Relationship;Laundry;Medication Management;Meal Prep;Shop;Yard Work    Stability/Clinical Decision Making Evolving/Moderate complexity    Rehab Potential Good    PT Frequency 2x / week    PT Duration 8 weeks    PT Treatment/Interventions ADLs/Self Care Home Management;Gait training;Stair training;Functional mobility training;Therapeutic activities;Therapeutic exercise;Balance training;Neuromuscular re-education;Patient/family education;Manual techniques;Passive range  of motion;DME Instruction    PT Next Visit Plan Recert and 59DJ visit PN note done; please re-check BERG and gait outside if able.  Still needs to work on endurance, descending stairs, alternating sequence to ascend, balance    PT [place]- updated on 04/12/20    Consulted and Agree with Plan of Care Patient           Patient will benefit from skilled therapeutic intervention in order to improve the following deficits and impairments:  Abnormal gait,Decreased activity tolerance,Decreased balance,Decreased mobility,Decreased endurance,Decreased coordination,Decreased range of motion,Decreased safety awareness,Decreased strength,Difficulty walking,Impaired flexibility,Postural dysfunction,Impaired UE functional use,Impaired tone  Visit Diagnosis: Muscle weakness (generalized)  Unsteadiness on feet  Other abnormalities of gait and mobility     Problem List Patient Active Problem List   Diagnosis Date Noted  . 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 Gait abnormality 08/12/2019  . History of gout 06/17/2019  . Bowel incontinence 06/09/2019  . Anoxic brain injury ([place])   . History of cardiac arrest 04/27/2019  . History of non-ST elevation myocardial infarction (NSTEMI) 04/18/2019  . Morbid obesity ([place]) 04/14/2019  . Diabetic neuropathy ([place]) 04/02/2016  . Hypertension associated with diabetes ([place]) 10/03/2015  . HLD (hyperlipidemia) 09/06/2015  . End stage renal disease on dialysis ([place]) 08/31/2015  . DM (diabetes mellitus), type 2 with renal complications ([place]) 57/06/7791  . OSA treated with BiPAP 12/12/2013  . Coronary atherosclerosis 11/01/2009   Rico Junker, PT, DPT 06/20/20    9:15 AM    [facility] [address] [place], [place], [address] Phone: [phone]   Fax:  [phone]  Name: Troy Wells MRN: [id number] Date of Birth: [birth year]

## 2020-06-21 DIAGNOSIS — D689 Coagulation defect, unspecified: Secondary | ICD-10-CM | POA: Diagnosis not present

## 2020-06-21 DIAGNOSIS — D631 Anemia in chronic kidney disease: Secondary | ICD-10-CM | POA: Diagnosis not present

## 2020-06-21 DIAGNOSIS — Z992 Dependence on renal dialysis: Secondary | ICD-10-CM | POA: Diagnosis not present

## 2020-06-21 DIAGNOSIS — N2581 Secondary hyperparathyroidism of renal origin: Secondary | ICD-10-CM | POA: Diagnosis not present

## 2020-06-21 DIAGNOSIS — R519 Headache, unspecified: Secondary | ICD-10-CM | POA: Diagnosis not present

## 2020-06-21 DIAGNOSIS — D688 Other specified coagulation defects: Secondary | ICD-10-CM | POA: Diagnosis not present

## 2020-06-21 DIAGNOSIS — R52 Pain, unspecified: Secondary | ICD-10-CM | POA: Diagnosis not present

## 2020-06-21 DIAGNOSIS — N186 End stage renal disease: Secondary | ICD-10-CM | POA: Diagnosis not present

## 2020-06-21 DIAGNOSIS — E1121 Type 2 diabetes mellitus with diabetic nephropathy: Secondary | ICD-10-CM | POA: Diagnosis not present

## 2020-06-21 DIAGNOSIS — L299 Pruritus, unspecified: Secondary | ICD-10-CM | POA: Diagnosis not present

## 2020-06-22 ENCOUNTER — Ambulatory Visit: Payer: Medicare Other | Admitting: Physical Therapy

## 2020-06-22 ENCOUNTER — Encounter: Payer: Self-pay | Admitting: Physical Therapy

## 2020-06-22 ENCOUNTER — Other Ambulatory Visit: Payer: Self-pay

## 2020-06-22 DIAGNOSIS — R2681 Unsteadiness on feet: Secondary | ICD-10-CM | POA: Diagnosis not present

## 2020-06-22 DIAGNOSIS — R2689 Other abnormalities of gait and mobility: Secondary | ICD-10-CM

## 2020-06-22 DIAGNOSIS — M6281 Muscle weakness (generalized): Secondary | ICD-10-CM | POA: Diagnosis not present

## 2020-06-22 NOTE — Therapy (Signed)
[facility] [address] [place], [place], [address] Phone: [phone]   Fax:  [phone]  Physical Therapy Treatment  Patient Details  Name: Troy Wells MRN: [id number] Date of Birth: 09-05-1965 Referring Provider (PT): Dr. Andrena Mews   Encounter Date: 06/22/2020   PT End of Session - 06/22/20 0805    Visit Number 50    Number of Visits 65    Date for PT Re-Evaluation 08/19/20    Authorization Type Medicare parts A&B;  10th visit progress note    Progress Note Due on Visit 20    PT Start Time 0802    PT Stop Time 0843    PT Time Calculation (min) 41 min    Activity Tolerance Patient limited by fatigue    Behavior During Therapy [facility] for tasks assessed/performed           Past Medical History:  Diagnosis Date  . CARDIAC ARREST 11/01/2009   Qualifier: Diagnosis of  By: Selena Batten CMA, Jewel    . Coagulase negative Staphylococcus bacteremia 06/30/2019  . Colon polyps 01/02/2016   Colonoscopy July 2017 - One 3 mm polyp in the transverse colon, removed with a cold biopsy forceps. Resected and retrieved. - One 3 mm polyp in the rectum, removed with a cold biopsy forceps. Resected and retrieved. - Diverticulosis in the entire examined colon. - Non-bleeding internal hemorrhoids. - The examination was otherwise normal. - Significant looping which prolonged cecal    . Coronary artery disease 08/15/2009   with AMI  . Coronary atherosclerosis 11/01/2009   Qualifier: Diagnosis of  By: Selena Batten CMA, Jewel    . Dialysis patient ([place]) 09/27/2014   mon, wed, and fri  . Diverticulosis of colon without hemorrhage 01/02/2016   Colonoscopy July 2017 - One 3 mm polyp in the transverse colon, removed with a cold biopsy forceps. Resected and retrieved. - One 3 mm polyp in the rectum, removed with a cold biopsy forceps. Resected and retrieved. - Diverticulosis in the entire examined colon. - Non-bleeding internal  hemorrhoids. - The examination was otherwise normal. - Significant looping which prolonged cecal    . DM (diabetes mellitus), type 2 with renal complications ([place]) 69/62/9528  . Essential hypertension 11/01/2009   Qualifier: Diagnosis of  By: Selena Batten CMA, Jewel    . Gout 10/21/2018  . History of acute respiratory distress syndrome (ARDS) due to COVID-19 virus   . History of cardiac arrest 04/27/2019  . History of non-ST elevation myocardial infarction (NSTEMI) 04/18/2019  . history of respiratory arrest   . Hyperlipidemia   . Hypertension   . Mitral valve regurgitation 09/19/2015   Echo 09/2015   . 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 OSA treated with BiPAP 06/17/2010  . Protein-calorie malnutrition ([place]) 06/01/2019   Poor PO intake, only eating 50% of meals, could benefit from feeding tube if persistently poor PO intake.  . Pulmonary hypertension ([place]) 04/14/2019   RVSP 38 (TTE 2017) but repeat TTE March 2019 with RVSP 25 RAP in 2020: 65mmhg  . Sleep apnea    cpap nightly    Past Surgical History:  Procedure Laterality Date  . AV FISTULA PLACEMENT Left 09/27/2014  . INSERTION OF ARTERIOVENOUS (AV) ARTEGRAFT ARM Left 04/02/2018   Procedure: INSERTION OF ARTERIOVENOUS (AV) ARTEGRAFT ARM LEFT UPPER ARM;  Surgeon: Waynetta Sandy, MD;  Location: [facility];  Service: Vascular;  Laterality: Left;  . INSERTION OF DIALYSIS CATHETER N/A 04/02/2018   Procedure: INSERTION OF DIALYSIS CATHETER, right internal jugular;  Surgeon: Waynetta Sandy, MD;  Location: [facility];  Service: Vascular;  Laterality: N/A;  . NECK SURGERY     C6 & C7 replaced 30 yrs ago per pt  . REVISON OF ARTERIOVENOUS FISTULA Left 04/02/2018   Procedure: REVISION PLICATION OF ARTERIOVENOUS FISTULA ARM;  Surgeon: Waynetta Sandy, MD;  Location: [facility];  Service: Vascular;  Laterality: Left;  . WISDOM TOOTH EXTRACTION      There were no vitals filed for this visit.   Subjective Assessment - 06/22/20 0804     Subjective No new complaints. No falls or pain.    Pertinent History CHF, HTN, ERSD    Limitations Standing;Walking;Writing;House hold activities;Lifting    How long can you sit comfortably? supported sitting no problem    How long can you stand comfortably? <5 min    How long can you walk comfortably? 20 feet with walker    Patient Stated Goals improve walking get stronger    Currently in Pain? No/denies              [facility] PT Assessment - 06/22/20 0806      Berg Balance Test   Sit to Stand Able to stand without using hands and stabilize independently    Standing Unsupported Able to stand safely 2 minutes    Sitting with Back Unsupported but Feet Supported on Floor or Stool Able to sit safely and securely 2 minutes    Stand to Sit Sits safely with minimal use of hands    Transfers Able to transfer safely, minor use of hands    Standing Unsupported with Eyes Closed Able to stand 10 seconds safely    Standing Unsupported with Feet Together Able to place feet together independently and stand 1 minute safely    From Standing, Reach Forward with Outstretched Arm Can reach confidently >25 cm (10")    From Standing Position, Pick up Object from Floor Able to pick up shoe safely and easily    From Standing Position, Turn to Look Behind Over each Shoulder Looks behind from both sides and weight shifts well    Turn 360 Degrees Able to turn 360 degrees safely but slowly   >5 sec's both ways   Standing Unsupported, Alternately Place Feet on Step/Stool Able to complete >2 steps/needs minimal assist   56.13 sec's with HHA for 4 taps   Standing Unsupported, One Foot in Front Able to plae foot ahead of the other independently and hold 30 seconds    Standing on One Leg Tries to lift leg/unable to hold 3 seconds but remains standing independently    Total Score 47    Berg comment: 47/56= moderate risk for falls               [facility] Adult PT Treatment/Exercise - 06/22/20 0821      Transfers    Transfers Sit to Stand;Stand to Sit;Stand Pivot Transfers    Sit to Stand 6: Modified independent (Device/Increase time)    Stand to Sit 6: Modified independent (Device/Increase time)      Ambulation/Gait   Ambulation/Gait Yes    Ambulation/Gait Assistance 5: Supervision;4: Min guard    Ambulation/Gait Assistance Details with gait had pt scanning all directions randomly with some veering noted, no significant loss of balance. min guard assist for safety.    Ambulation Distance (Feet) 230 Feet   x1, plus around gym with session   Assistive device Straight cane   with rubber quad tip   Gait Pattern  Step-through pattern;Decreased stride length;Trunk flexed;Decreased stance time - right;Decreased hip/knee flexion - right    Ambulation Surface Level;Indoor      High Level Balance   High Level Balance Activities Negotiating over obstacles    High Level Balance Comments with cane: forward stepping over 3 bolsters next to countertop for 6 laps with UE support on counter, min guard assist for safety. increased time needed to initiate movements, this improved with cues to take deep breath and "just walk with higher steps". pt's ease of stepping improved as laps progressed with continued need for cane/counter support.      Exercises   Exercises Other Exercises    Other Exercises  Reviewed HEP with pt today with new handout and blue theraband given to patient as pt reported he has not been consistent with the ex's the past few weeks and lost his green band. Pt without questions after review today in session.               PT Short Term Goals - 06/20/20 0915      PT SHORT TERM GOAL #1   Title Pt will ambulate x 800' outside over pavement with quad cane and supervision    Time 4    Period Weeks    Status Revised    Target Date 07/20/20      PT SHORT TERM GOAL #2   Title Patient will be able to ascend and descend 8 stairs with ALTERNATING sequence with one rail and cane with supervision     Time 4    Period Weeks    Status Revised    Target Date 07/20/20      PT SHORT TERM GOAL #3   Title Pt will increase BERG score to >/= 48/56    Time 4    Period Weeks    Status Revised    Target Date 07/20/20      PT SHORT TERM GOAL #4   Title Pt will decrease TUG with cane to </= 13 seconds with supervision    Baseline 15 seconds with cane    Time 4    Period Weeks    Status Revised    Target Date 07/20/20             PT Long Term Goals - 06/20/20 0917      PT LONG TERM GOAL #1   Title Patient will be able to ambulate >1000 feet with quad cane and supervision to improve community ambulation    Time 8    Period Weeks    Status Revised    Target Date 08/19/20      PT LONG TERM GOAL #2   Title Pt will report ability to get on and off transportation and ambulate into clinic with LRAD MOD I (no w/c)    Time 8    Period Weeks    Status Revised    Target Date 08/19/20      PT LONG TERM GOAL #3   Title Patient will be able to go up and down 17 steps with use of one rail with step to sequence with Supervision    Baseline (pt reports he uses one rail and wall to descend; wants to keep a cane at the top and bottom of his stairs)    Time 8    Period Weeks    Status Revised    Target Date 08/19/20      PT LONG TERM GOAL #4   Title Patient will  demo improvement on Berg balance scale to at least 50/56 to improve functional balance and reduce fall risk    Time 8    Period Weeks    Status Revised    Target Date 08/19/20                 Plan - 06/22/20 0805    Clinical Impression Statement Today's skilled session initially focused on setting updated baseline score for the Berg Balance test with pt scoring 47/56 today. Then session focused on review of current HEP as pt reported he has not been performing it the past few weeks due to needing a new copy of the ex's and new band. Both provided this session. Remainder of session continued to address dynamic gait  activities with straight cane with rubber quad tip. No issues noted or reported in session. The pt is progressing toward goals and should benefit from continued PT to progress toward unmet goals.    Personal Factors and Comorbidities Comorbidity 3+;Past/Current Experience;Time since onset of injury/illness/exacerbation;Transportation;Fitness;Age    Comorbidities CHF, ESRD, HTN    Examination-Activity Limitations Bathing;Bed Mobility;Caring for Others;Carry;Dressing;Hygiene/Grooming;Lift;Locomotion Level;Self Feeding;Sit;Squat;Stairs;Stand;Toileting;Transfers    Examination-Participation Restrictions Cleaning;Community Activity;Driving;Interpersonal Relationship;Laundry;Medication Management;Meal Prep;Shop;Yard Work    Stability/Clinical Decision Making Evolving/Moderate complexity    Rehab Potential Good    PT Frequency 2x / week    PT Duration 8 weeks    PT Treatment/Interventions ADLs/Self Care Home Management;Gait training;Stair training;Functional mobility training;Therapeutic activities;Therapeutic exercise;Balance training;Neuromuscular re-education;Patient/family education;Manual techniques;Passive range of motion;DME Instruction    PT Next Visit Plan Still needs to work on endurance, descending stairs, alternating sequence to ascend, balance- static on compliant surfaces, dynamic on solid and compliant surfaces.    PT [place]- updated on 04/12/20    Consulted and Agree with Plan of Care Patient           Patient will benefit from skilled therapeutic intervention in order to improve the following deficits and impairments:  Abnormal gait,Decreased activity tolerance,Decreased balance,Decreased mobility,Decreased endurance,Decreased coordination,Decreased range of motion,Decreased safety awareness,Decreased strength,Difficulty walking,Impaired flexibility,Postural dysfunction,Impaired UE functional use,Impaired tone  Visit Diagnosis: Muscle weakness  (generalized)  Unsteadiness on feet  Other abnormalities of gait and mobility     Problem List Patient Active Problem List   Diagnosis Date Noted  . 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 Gait abnormality 08/12/2019  . History of gout 06/17/2019  . Bowel incontinence 06/09/2019  . Anoxic brain injury ([place])   . History of cardiac arrest 04/27/2019  . History of non-ST elevation myocardial infarction (NSTEMI) 04/18/2019  . Morbid obesity ([place]) 04/14/2019  . Diabetic neuropathy ([place]) 04/02/2016  . Hypertension associated with diabetes ([place]) 10/03/2015  . HLD (hyperlipidemia) 09/06/2015  . End stage renal disease on dialysis ([place]) 08/31/2015  . DM (diabetes mellitus), type 2 with renal complications ([place]) 43/56/8616  . OSA treated with BiPAP 12/12/2013  . Coronary atherosclerosis 11/01/2009   Willow Ora, PTA, [facility] [address] [place], [place] [address] [phone] 06/22/20, 9:09 AM   Name: Radwan Cowley MRN: [id number] Date of Birth: 1965-11-01

## 2020-06-22 NOTE — Patient Instructions (Signed)
Access Code: [email or web address] URL: [email or web address] Date: 06/22/2020 Prepared by: Willow Ora  Exercises Side Stepping with Counter Support - 1 x daily - 7 x weekly - 2 sets Standing Hip Abduction with Resistance at Ankles and Counter Support - 1 x daily - 5 x weekly - 1 sets - 10 reps Standing Hip Extension with Resistance at Ankles and Counter Support - 1 x daily - 5 x weekly - 1 sets - 10 reps Standing Hip Flexion with Resistance Loop - 1 x daily - 7 x weekly - 2 sets - 10 reps Standing Single Leg Stance with Counter Support - 1 x daily - 5 x weekly - 1 sets - 3 reps - 10 hold Tandem Walking with Counter Support - 1 x daily - 5 x weekly - 1 sets - 3 reps Standing Foot Tap on Box (BKA) - 1 x daily - 5 x weekly - 1 sets - 10 reps

## 2020-06-23 DIAGNOSIS — N186 End stage renal disease: Secondary | ICD-10-CM | POA: Diagnosis not present

## 2020-06-23 DIAGNOSIS — D631 Anemia in chronic kidney disease: Secondary | ICD-10-CM | POA: Diagnosis not present

## 2020-06-23 DIAGNOSIS — N2581 Secondary hyperparathyroidism of renal origin: Secondary | ICD-10-CM | POA: Diagnosis not present

## 2020-06-23 DIAGNOSIS — R52 Pain, unspecified: Secondary | ICD-10-CM | POA: Diagnosis not present

## 2020-06-23 DIAGNOSIS — E1121 Type 2 diabetes mellitus with diabetic nephropathy: Secondary | ICD-10-CM | POA: Diagnosis not present

## 2020-06-23 DIAGNOSIS — D688 Other specified coagulation defects: Secondary | ICD-10-CM | POA: Diagnosis not present

## 2020-06-23 DIAGNOSIS — R519 Headache, unspecified: Secondary | ICD-10-CM | POA: Diagnosis not present

## 2020-06-23 DIAGNOSIS — D689 Coagulation defect, unspecified: Secondary | ICD-10-CM | POA: Diagnosis not present

## 2020-06-23 DIAGNOSIS — Z992 Dependence on renal dialysis: Secondary | ICD-10-CM | POA: Diagnosis not present

## 2020-06-23 DIAGNOSIS — L299 Pruritus, unspecified: Secondary | ICD-10-CM | POA: Diagnosis not present

## 2020-06-26 DIAGNOSIS — D689 Coagulation defect, unspecified: Secondary | ICD-10-CM | POA: Diagnosis not present

## 2020-06-26 DIAGNOSIS — D631 Anemia in chronic kidney disease: Secondary | ICD-10-CM | POA: Diagnosis not present

## 2020-06-26 DIAGNOSIS — N186 End stage renal disease: Secondary | ICD-10-CM | POA: Diagnosis not present

## 2020-06-26 DIAGNOSIS — N2581 Secondary hyperparathyroidism of renal origin: Secondary | ICD-10-CM | POA: Diagnosis not present

## 2020-06-26 DIAGNOSIS — Z992 Dependence on renal dialysis: Secondary | ICD-10-CM | POA: Diagnosis not present

## 2020-06-26 DIAGNOSIS — E1121 Type 2 diabetes mellitus with diabetic nephropathy: Secondary | ICD-10-CM | POA: Diagnosis not present

## 2020-06-26 DIAGNOSIS — R52 Pain, unspecified: Secondary | ICD-10-CM | POA: Diagnosis not present

## 2020-06-26 DIAGNOSIS — R519 Headache, unspecified: Secondary | ICD-10-CM | POA: Diagnosis not present

## 2020-06-26 DIAGNOSIS — L299 Pruritus, unspecified: Secondary | ICD-10-CM | POA: Diagnosis not present

## 2020-06-26 DIAGNOSIS — D688 Other specified coagulation defects: Secondary | ICD-10-CM | POA: Diagnosis not present

## 2020-06-27 ENCOUNTER — Ambulatory Visit: Payer: Medicare Other | Admitting: Physical Therapy

## 2020-06-28 DIAGNOSIS — L299 Pruritus, unspecified: Secondary | ICD-10-CM | POA: Diagnosis not present

## 2020-06-28 DIAGNOSIS — R519 Headache, unspecified: Secondary | ICD-10-CM | POA: Diagnosis not present

## 2020-06-28 DIAGNOSIS — D689 Coagulation defect, unspecified: Secondary | ICD-10-CM | POA: Diagnosis not present

## 2020-06-28 DIAGNOSIS — N2581 Secondary hyperparathyroidism of renal origin: Secondary | ICD-10-CM | POA: Diagnosis not present

## 2020-06-28 DIAGNOSIS — E1121 Type 2 diabetes mellitus with diabetic nephropathy: Secondary | ICD-10-CM | POA: Diagnosis not present

## 2020-06-28 DIAGNOSIS — D688 Other specified coagulation defects: Secondary | ICD-10-CM | POA: Diagnosis not present

## 2020-06-28 DIAGNOSIS — Z992 Dependence on renal dialysis: Secondary | ICD-10-CM | POA: Diagnosis not present

## 2020-06-28 DIAGNOSIS — R52 Pain, unspecified: Secondary | ICD-10-CM | POA: Diagnosis not present

## 2020-06-28 DIAGNOSIS — D631 Anemia in chronic kidney disease: Secondary | ICD-10-CM | POA: Diagnosis not present

## 2020-06-28 DIAGNOSIS — N186 End stage renal disease: Secondary | ICD-10-CM | POA: Diagnosis not present

## 2020-06-29 ENCOUNTER — Other Ambulatory Visit: Payer: Self-pay

## 2020-06-29 ENCOUNTER — Encounter: Payer: Self-pay | Admitting: Physical Therapy

## 2020-06-29 ENCOUNTER — Ambulatory Visit: Payer: Medicare Other | Admitting: Physical Therapy

## 2020-06-29 DIAGNOSIS — M6281 Muscle weakness (generalized): Secondary | ICD-10-CM | POA: Diagnosis not present

## 2020-06-29 DIAGNOSIS — R2681 Unsteadiness on feet: Secondary | ICD-10-CM | POA: Diagnosis not present

## 2020-06-29 DIAGNOSIS — R2689 Other abnormalities of gait and mobility: Secondary | ICD-10-CM

## 2020-06-29 NOTE — Therapy (Signed)
[facility] [address] [place], [place], [address] Phone: [phone]   Fax:  [phone]  Physical Therapy Treatment  Patient Details  Name: Troy Wells MRN: [id number] Date of Birth: 03-Jun-1966 Referring Provider (PT): Dr. Andrena Mews   Encounter Date: 06/29/2020   PT End of Session - 06/29/20 0855    Visit Number 69    Number of Visits 65    Date for PT Re-Evaluation 08/19/20    Authorization Type Medicare parts A&B;  10th visit progress note    Progress Note Due on Visit 41    PT Start Time 0804    PT Stop Time 0844    PT Time Calculation (min) 40 min    Equipment Utilized During Treatment Gait belt    Activity Tolerance Patient limited by fatigue;Patient tolerated treatment well   takes several rest breaks between bouts of gait   Behavior During Therapy [facility] for tasks assessed/performed           Past Medical History:  Diagnosis Date  . CARDIAC ARREST 11/01/2009   Qualifier: Diagnosis of  By: Selena Batten CMA, Jewel    . Coagulase negative Staphylococcus bacteremia 06/30/2019  . Colon polyps 01/02/2016   Colonoscopy July 2017 - One 3 mm polyp in the transverse colon, removed with a cold biopsy forceps. Resected and retrieved. - One 3 mm polyp in the rectum, removed with a cold biopsy forceps. Resected and retrieved. - Diverticulosis in the entire examined colon. - Non-bleeding internal hemorrhoids. - The examination was otherwise normal. - Significant looping which prolonged cecal    . Coronary artery disease 08/15/2009   with AMI  . Coronary atherosclerosis 11/01/2009   Qualifier: Diagnosis of  By: Selena Batten CMA, Jewel    . Dialysis patient ([place]) 09/27/2014   mon, wed, and fri  . Diverticulosis of colon without hemorrhage 01/02/2016   Colonoscopy July 2017 - One 3 mm polyp in the transverse colon, removed with a cold biopsy forceps. Resected and retrieved. - One 3 mm polyp in the rectum, removed  with a cold biopsy forceps. Resected and retrieved. - Diverticulosis in the entire examined colon. - Non-bleeding internal hemorrhoids. - The examination was otherwise normal. - Significant looping which prolonged cecal    . DM (diabetes mellitus), type 2 with renal complications ([place]) 50/02/3817  . Essential hypertension 11/01/2009   Qualifier: Diagnosis of  By: Selena Batten CMA, Jewel    . Gout 10/21/2018  . History of acute respiratory distress syndrome (ARDS) due to COVID-19 virus   . History of cardiac arrest 04/27/2019  . History of non-ST elevation myocardial infarction (NSTEMI) 04/18/2019  . history of respiratory arrest   . Hyperlipidemia   . Hypertension   . Mitral valve regurgitation 09/19/2015   Echo 09/2015   . 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 OSA treated with BiPAP 06/17/2010  . Protein-calorie malnutrition ([place]) 06/01/2019   Poor PO intake, only eating 50% of meals, could benefit from feeding tube if persistently poor PO intake.  . Pulmonary hypertension ([place]) 04/14/2019   RVSP 38 (TTE 2017) but repeat TTE March 2019 with RVSP 25 RAP in 2020: 70mmhg  . Sleep apnea    cpap nightly    Past Surgical History:  Procedure Laterality Date  . AV FISTULA PLACEMENT Left 09/27/2014  . INSERTION OF ARTERIOVENOUS (AV) ARTEGRAFT ARM Left 04/02/2018   Procedure: INSERTION OF ARTERIOVENOUS (AV) ARTEGRAFT ARM LEFT UPPER ARM;  Surgeon: Waynetta Sandy, MD;  Location: [facility];  Service: Vascular;  Laterality: Left;  . INSERTION OF DIALYSIS CATHETER N/A 04/02/2018   Procedure: INSERTION OF DIALYSIS CATHETER, right internal jugular;  Surgeon: Waynetta Sandy, MD;  Location: [facility];  Service: Vascular;  Laterality: N/A;  . NECK SURGERY     C6 & C7 replaced 30 yrs ago per pt  . REVISON OF ARTERIOVENOUS FISTULA Left 04/02/2018   Procedure: REVISION PLICATION OF ARTERIOVENOUS FISTULA ARM;  Surgeon: Waynetta Sandy, MD;  Location: [facility];  Service: Vascular;  Laterality: Left;  .  WISDOM TOOTH EXTRACTION      There were no vitals filed for this visit.   Subjective Assessment - 06/29/20 0806    Subjective Nothing new; no changes or falls.    Pertinent History CHF, HTN, ERSD    Limitations Standing;Walking;Writing;House hold activities;Lifting    How long can you sit comfortably? supported sitting no problem    How long can you stand comfortably? <5 min    How long can you walk comfortably? 20 feet with walker    Patient Stated Goals improve walking get stronger    Currently in Pain? No/denies                             [facility] Adult PT Treatment/Exercise - 06/29/20 0001      Transfers   Transfers Sit to Stand;Stand to Sit;Stand Pivot Transfers    Sit to Stand 6: Modified independent (Device/Increase time)    Stand to Sit 6: Modified independent (Device/Increase time)      Ambulation/Gait   Ambulation/Gait Yes    Ambulation/Gait Assistance 5: Supervision;4: Min guard    Ambulation/Gait Assistance Details Gait on level ground in clinic, then progression to stepping over flat black obstacles (attempted having the black beam upright, but too high for pt to clear.)  Also gait with negotiation around cones, figure-8 around cones, and then stepping over successive flat obstacles (yardsticks), x 3 reps with cues for cane placement and sequence.  PT provides HHA with stepping over obstacles and cues for trying to negotiate stepping over obstacles with less stop time.    Ambulation Distance (Feet) 230 Feet   115 ft x 2, additional gait with obstacle negotiation   Assistive device Straight cane   rubber quad tip   Gait Pattern Step-through pattern;Decreased stride length;Trunk flexed;Decreased stance time - right;Decreased hip/knee flexion - right   Tremor/ataxic movement patterns with anticipation of obstacles and steps   Ambulation Surface Level;Indoor    Stairs Yes    Stairs Assistance 4: Min guard    Stairs Assistance Details (indicate cue type and  reason) Bilat rails, then R rail and cane ascending step to pattern; pt prefers to use bilat rails to step down.    Stair Management Technique One rail Right;One rail Left;Step to pattern;Forwards;With cane    Number of Stairs 8    Height of Stairs 6    Gait Comments Standing weightshifting lateral wide BOS, upon initiation of gait to help relax and assist with weigthshifting, then weightshift march x 10.  Also trialed stagger stance weightshift anterior/posterior direction to help relax and lessen tremors prior to gait.      Knee/Hip Exercises: Stretches   Active Hamstring Stretch Right;Left;30 seconds;3 reps    Active Hamstring Stretch Limitations Cues for posture and technique    Gastroc Stretch Right;Left;30 seconds;2 reps    Gastroc Stretch Limitations SEated propped foot on ground, using gait belt to pull toes towards body for  stretch                  PT Education - 06/29/20 0854    Education Details Encouraged pt to be walking at home (with walker) for longer distances several times each day for improved endurance.    Person(s) Educated Patient    Methods Explanation    Comprehension Verbalized understanding            PT Short Term Goals - 06/20/20 0915      PT SHORT TERM GOAL #1   Title Pt will ambulate x 800' outside over pavement with quad cane and supervision    Time 4    Period Weeks    Status Revised    Target Date 07/20/20      PT SHORT TERM GOAL #2   Title Patient will be able to ascend and descend 8 stairs with ALTERNATING sequence with one rail and cane with supervision    Time 4    Period Weeks    Status Revised    Target Date 07/20/20      PT SHORT TERM GOAL #3   Title Pt will increase BERG score to >/= 48/56    Time 4    Period Weeks    Status Revised    Target Date 07/20/20      PT SHORT TERM GOAL #4   Title Pt will decrease TUG with cane to </= 13 seconds with supervision    Baseline 15 seconds with cane    Time 4    Period Weeks     Status Revised    Target Date 07/20/20             PT Long Term Goals - 06/20/20 0917      PT LONG TERM GOAL #1   Title Patient will be able to ambulate >1000 feet with quad cane and supervision to improve community ambulation    Time 8    Period Weeks    Status Revised    Target Date 08/19/20      PT LONG TERM GOAL #2   Title Pt will report ability to get on and off transportation and ambulate into clinic with LRAD MOD I (no w/c)    Time 8    Period Weeks    Status Revised    Target Date 08/19/20      PT LONG TERM GOAL #3   Title Patient will be able to go up and down 17 steps with use of one rail with step to sequence with Supervision    Baseline (pt reports he uses one rail and wall to descend; wants to keep a cane at the top and bottom of his stairs)    Time 8    Period Weeks    Status Revised    Target Date 08/19/20      PT LONG TERM GOAL #4   Title Patient will demo improvement on Berg balance scale to at least 50/56 to improve functional balance and reduce fall risk    Time 8    Period Weeks    Status Revised    Target Date 08/19/20                 Plan - 06/29/20 0856    Clinical Impression Statement Focus of skilled PT session today on multiple bouts of gait with cane, including stepping around and over obstacles.  With black balance beam in flat position, he needs additional HHA/min assist and  cues for cane placement/foot sequence.  With stepping over obstacles, he has definite stop/extra time to reposition and plan, with tremors increasing.  With flatter obstacles, pt able to perform with more consistent step-over/step through motions.  He will continue to benefit from skilled PT to further address strength, balance and gait training to work towards goals for improved overall functional mobility.    Personal Factors and Comorbidities Comorbidity 3+;Past/Current Experience;Time since onset of injury/illness/exacerbation;Transportation;Fitness;Age     Comorbidities CHF, ESRD, HTN    Examination-Activity Limitations Bathing;Bed Mobility;Caring for Others;Carry;Dressing;Hygiene/Grooming;Lift;Locomotion Level;Self Feeding;Sit;Squat;Stairs;Stand;Toileting;Transfers    Examination-Participation Restrictions Cleaning;Community Activity;Driving;Interpersonal Relationship;Laundry;Medication Management;Meal Prep;Shop;Yard Work    Stability/Clinical Decision Making Evolving/Moderate complexity    Rehab Potential Good    PT Frequency 2x / week    PT Duration 8 weeks    PT Treatment/Interventions ADLs/Self Care Home Management;Gait training;Stair training;Functional mobility training;Therapeutic activities;Therapeutic exercise;Balance training;Neuromuscular re-education;Patient/family education;Manual techniques;Passive range of motion;DME Instruction    PT Next Visit Plan Still needs to work on endurance, descending stairs, alternating sequence to ascend, balance- static on compliant surfaces, dynamic on solid and compliant surfaces.    PT [place]- updated on 04/12/20    Consulted and Agree with Plan of Care Patient           Patient will benefit from skilled therapeutic intervention in order to improve the following deficits and impairments:  Abnormal gait,Decreased activity tolerance,Decreased balance,Decreased mobility,Decreased endurance,Decreased coordination,Decreased range of motion,Decreased safety awareness,Decreased strength,Difficulty walking,Impaired flexibility,Postural dysfunction,Impaired UE functional use,Impaired tone  Visit Diagnosis: Other abnormalities of gait and mobility  Unsteadiness on feet     Problem List Patient Active Problem List   Diagnosis Date Noted  . 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 Gait abnormality 08/12/2019  . History of gout 06/17/2019  . Bowel incontinence 06/09/2019  . Anoxic brain injury ([place])   . History of cardiac arrest 04/27/2019  . History of non-ST elevation  myocardial infarction (NSTEMI) 04/18/2019  . Morbid obesity ([place]) 04/14/2019  . Diabetic neuropathy ([place]) 04/02/2016  . Hypertension associated with diabetes ([place]) 10/03/2015  . HLD (hyperlipidemia) 09/06/2015  . End stage renal disease on dialysis ([place]) 08/31/2015  . DM (diabetes mellitus), type 2 with renal complications ([place]) 54/00/8676  . OSA treated with BiPAP 12/12/2013  . Coronary atherosclerosis 11/01/2009    Brenley Priore W. 06/29/2020, 9:01 AM  Frazier Butt., PT   [facility] [address] [place], [place], [address] Phone: [phone]   Fax:  [phone]  Name: Troy Wells MRN: [id number] Date of Birth: May 27, 1966

## 2020-06-30 DIAGNOSIS — D689 Coagulation defect, unspecified: Secondary | ICD-10-CM | POA: Diagnosis not present

## 2020-06-30 DIAGNOSIS — D631 Anemia in chronic kidney disease: Secondary | ICD-10-CM | POA: Diagnosis not present

## 2020-06-30 DIAGNOSIS — R52 Pain, unspecified: Secondary | ICD-10-CM | POA: Diagnosis not present

## 2020-06-30 DIAGNOSIS — N186 End stage renal disease: Secondary | ICD-10-CM | POA: Diagnosis not present

## 2020-06-30 DIAGNOSIS — N2581 Secondary hyperparathyroidism of renal origin: Secondary | ICD-10-CM | POA: Diagnosis not present

## 2020-06-30 DIAGNOSIS — Z992 Dependence on renal dialysis: Secondary | ICD-10-CM | POA: Diagnosis not present

## 2020-06-30 DIAGNOSIS — E1121 Type 2 diabetes mellitus with diabetic nephropathy: Secondary | ICD-10-CM | POA: Diagnosis not present

## 2020-06-30 DIAGNOSIS — R519 Headache, unspecified: Secondary | ICD-10-CM | POA: Diagnosis not present

## 2020-06-30 DIAGNOSIS — D688 Other specified coagulation defects: Secondary | ICD-10-CM | POA: Diagnosis not present

## 2020-06-30 DIAGNOSIS — L299 Pruritus, unspecified: Secondary | ICD-10-CM | POA: Diagnosis not present

## 2020-07-03 DIAGNOSIS — D631 Anemia in chronic kidney disease: Secondary | ICD-10-CM | POA: Diagnosis not present

## 2020-07-03 DIAGNOSIS — L299 Pruritus, unspecified: Secondary | ICD-10-CM | POA: Diagnosis not present

## 2020-07-03 DIAGNOSIS — N2581 Secondary hyperparathyroidism of renal origin: Secondary | ICD-10-CM | POA: Diagnosis not present

## 2020-07-03 DIAGNOSIS — R52 Pain, unspecified: Secondary | ICD-10-CM | POA: Diagnosis not present

## 2020-07-03 DIAGNOSIS — D689 Coagulation defect, unspecified: Secondary | ICD-10-CM | POA: Diagnosis not present

## 2020-07-03 DIAGNOSIS — D688 Other specified coagulation defects: Secondary | ICD-10-CM | POA: Diagnosis not present

## 2020-07-03 DIAGNOSIS — N186 End stage renal disease: Secondary | ICD-10-CM | POA: Diagnosis not present

## 2020-07-03 DIAGNOSIS — E1121 Type 2 diabetes mellitus with diabetic nephropathy: Secondary | ICD-10-CM | POA: Diagnosis not present

## 2020-07-03 DIAGNOSIS — Z992 Dependence on renal dialysis: Secondary | ICD-10-CM | POA: Diagnosis not present

## 2020-07-03 DIAGNOSIS — R519 Headache, unspecified: Secondary | ICD-10-CM | POA: Diagnosis not present

## 2020-07-04 ENCOUNTER — Ambulatory Visit: Payer: Medicare Other | Admitting: Physical Therapy

## 2020-07-05 DIAGNOSIS — E1121 Type 2 diabetes mellitus with diabetic nephropathy: Secondary | ICD-10-CM | POA: Diagnosis not present

## 2020-07-05 DIAGNOSIS — D631 Anemia in chronic kidney disease: Secondary | ICD-10-CM | POA: Diagnosis not present

## 2020-07-05 DIAGNOSIS — R519 Headache, unspecified: Secondary | ICD-10-CM | POA: Diagnosis not present

## 2020-07-05 DIAGNOSIS — L299 Pruritus, unspecified: Secondary | ICD-10-CM | POA: Diagnosis not present

## 2020-07-05 DIAGNOSIS — D689 Coagulation defect, unspecified: Secondary | ICD-10-CM | POA: Diagnosis not present

## 2020-07-05 DIAGNOSIS — Z992 Dependence on renal dialysis: Secondary | ICD-10-CM | POA: Diagnosis not present

## 2020-07-05 DIAGNOSIS — N2581 Secondary hyperparathyroidism of renal origin: Secondary | ICD-10-CM | POA: Diagnosis not present

## 2020-07-05 DIAGNOSIS — N186 End stage renal disease: Secondary | ICD-10-CM | POA: Diagnosis not present

## 2020-07-05 DIAGNOSIS — R52 Pain, unspecified: Secondary | ICD-10-CM | POA: Diagnosis not present

## 2020-07-05 DIAGNOSIS — D688 Other specified coagulation defects: Secondary | ICD-10-CM | POA: Diagnosis not present

## 2020-07-06 ENCOUNTER — Ambulatory Visit: Payer: Medicare Other | Admitting: Physical Therapy

## 2020-07-06 ENCOUNTER — Other Ambulatory Visit: Payer: Self-pay

## 2020-07-06 ENCOUNTER — Encounter: Payer: Self-pay | Admitting: Physical Therapy

## 2020-07-06 VITALS — BP 127/70 | HR 91

## 2020-07-06 DIAGNOSIS — M6281 Muscle weakness (generalized): Secondary | ICD-10-CM

## 2020-07-06 DIAGNOSIS — R2689 Other abnormalities of gait and mobility: Secondary | ICD-10-CM

## 2020-07-06 DIAGNOSIS — R2681 Unsteadiness on feet: Secondary | ICD-10-CM

## 2020-07-06 NOTE — Therapy (Signed)
[facility] [address] [place], [place], [address] Phone: [phone]   Fax:  [phone]  Physical Therapy Treatment  Patient Details  Name: Troy Wells MRN: [id number] Date of Birth: 1966/03/12 Referring Provider (PT): Dr. Andrena Mews   Encounter Date: 07/06/2020   PT End of Session - 07/06/20 0857    Visit Number 77    Number of Visits 65    Date for PT Re-Evaluation 08/19/20    Authorization Type Medicare parts A&B;  10th visit progress note    Progress Note Due on Visit 6    PT Start Time 0800    PT Stop Time 0840    PT Time Calculation (min) 40 min    Equipment Utilized During Treatment --    Activity Tolerance Patient tolerated treatment well    Behavior During Therapy Troy Wells for tasks assessed/performed           Past Medical History:  Diagnosis Date  . CARDIAC ARREST 11/01/2009   Qualifier: Diagnosis of  By: Selena Batten CMA, Jewel    . Coagulase negative Staphylococcus bacteremia 06/30/2019  . Colon polyps 01/02/2016   Colonoscopy July 2017 - One 3 mm polyp in the transverse colon, removed with a cold biopsy forceps. Resected and retrieved. - One 3 mm polyp in the rectum, removed with a cold biopsy forceps. Resected and retrieved. - Diverticulosis in the entire examined colon. - Non-bleeding internal hemorrhoids. - The examination was otherwise normal. - Significant looping which prolonged cecal    . Coronary artery disease 08/15/2009   with AMI  . Coronary atherosclerosis 11/01/2009   Qualifier: Diagnosis of  By: Selena Batten CMA, Jewel    . Dialysis patient ([place]) 09/27/2014   mon, wed, and fri  . Diverticulosis of colon without hemorrhage 01/02/2016   Colonoscopy July 2017 - One 3 mm polyp in the transverse colon, removed with a cold biopsy forceps. Resected and retrieved. - One 3 mm polyp in the rectum, removed with a cold biopsy forceps. Resected and retrieved. - Diverticulosis in the entire  examined colon. - Non-bleeding internal hemorrhoids. - The examination was otherwise normal. - Significant looping which prolonged cecal    . DM (diabetes mellitus), type 2 with renal complications ([place]) 45/08/8880  . Essential hypertension 11/01/2009   Qualifier: Diagnosis of  By: Selena Batten CMA, Jewel    . Gout 10/21/2018  . History of acute respiratory distress syndrome (ARDS) due to COVID-19 virus   . History of cardiac arrest 04/27/2019  . History of non-ST elevation myocardial infarction (NSTEMI) 04/18/2019  . history of respiratory arrest   . Hyperlipidemia   . Hypertension   . Mitral valve regurgitation 09/19/2015   Echo 09/2015   . 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 OSA treated with BiPAP 06/17/2010  . Protein-calorie malnutrition ([place]) 06/01/2019   Poor PO intake, only eating 50% of meals, could benefit from feeding tube if persistently poor PO intake.  . Pulmonary hypertension ([place]) 04/14/2019   RVSP 38 (TTE 2017) but repeat TTE March 2019 with RVSP 25 RAP in 2020: 60mmhg  . Sleep apnea    cpap nightly    Past Surgical History:  Procedure Laterality Date  . AV FISTULA PLACEMENT Left 09/27/2014  . INSERTION OF ARTERIOVENOUS (AV) ARTEGRAFT ARM Left 04/02/2018   Procedure: INSERTION OF ARTERIOVENOUS (AV) ARTEGRAFT ARM LEFT UPPER ARM;  Surgeon: Waynetta Sandy, MD;  Location: [facility];  Service: Vascular;  Laterality: Left;  . INSERTION OF DIALYSIS CATHETER N/A 04/02/2018  Procedure: INSERTION OF DIALYSIS CATHETER, right internal jugular;  Surgeon: Waynetta Sandy, MD;  Location: [facility];  Service: Vascular;  Laterality: N/A;  . NECK SURGERY     C6 & C7 replaced 30 yrs ago per pt  . REVISON OF ARTERIOVENOUS FISTULA Left 04/02/2018   Procedure: REVISION PLICATION OF ARTERIOVENOUS FISTULA ARM;  Surgeon: Waynetta Sandy, MD;  Location: [facility];  Service: Vascular;  Laterality: Left;  . WISDOM TOOTH EXTRACTION      Vitals:   07/06/20 0816  BP: 127/70  Pulse: 91      Subjective Assessment - 07/06/20 0810    Subjective Challenging getting out to the bus today because still has ice on his driveway.  No falls.    Pertinent History CHF, HTN, ERSD    Limitations Standing;Walking;Writing;House hold activities;Lifting    How long can you sit comfortably? supported sitting no problem    How long can you stand comfortably? <5 min    How long can you walk comfortably? 20 feet with walker    Patient Stated Goals improve walking get stronger    Currently in Pain? No/denies                             [facility] Adult PT Treatment/Exercise - 07/06/20 0811      Ambulation/Gait   Ambulation/Gait Yes    Ambulation/Gait Assistance 5: Supervision;4: Min assist    Ambulation/Gait Assistance Details with cane with quad tip with no tremors after performing step ups and step overs    Ambulation Distance (Feet) 115 Feet    Assistive device Straight cane    Gait Pattern Step-through pattern;Decreased stride length;Trunk flexed;Decreased hip/knee flexion - right    Ambulation Surface Level;Indoor    Curb 4: Min assist    Curb Details (indicate cue type and reason) with cane    Gait Comments Stepping over balance beam and then up/down 4" step with cane x 3 reps with min A; pt continues to have some hesitancy with descending and stepping over objects due to LE weakness.      Knee/Hip Exercises: Aerobic   Stepper SCI Fit stepper at level 5.5 x 9 minutes with bilat UE and LE for aerobic conditioning and endurance      Knee/Hip Exercises: Standing   Step Down Right;Left;10 reps;Hand Hold: 2;Step Height: 4";2 sets    Step Down Limitations Forwards with bilat UE support on //  bars, able to step foot all the way down each LE.  Performed second set, holding one foot down on the ground and releasing opposite UE from bars for a few seconds x 10 reps, alternating with min A.               Balance Exercises - 07/06/20 0828      Balance Exercises:  Standing   Other Standing Exercises 3 sets x 10 reps alternating opposite UE and LE raise with 2 second hold on solid surface, compliant surface and then stepping one foot forward off compliant pad.               PT Short Term Goals - 06/20/20 0915      PT SHORT TERM GOAL #1   Title Pt will ambulate x 800' outside over pavement with quad cane and supervision    Time 4    Period Weeks    Status Revised    Target Date 07/20/20      PT  SHORT TERM GOAL #2   Title Patient will be able to ascend and descend 8 stairs with ALTERNATING sequence with one rail and cane with supervision    Time 4    Period Weeks    Status Revised    Target Date 07/20/20      PT SHORT TERM GOAL #3   Title Pt will increase BERG score to >/= 48/56    Time 4    Period Weeks    Status Revised    Target Date 07/20/20      PT SHORT TERM GOAL #4   Title Pt will decrease TUG with cane to </= 13 seconds with supervision    Baseline 15 seconds with cane    Time 4    Period Weeks    Status Revised    Target Date 07/20/20             PT Long Term Goals - 06/20/20 0917      PT LONG TERM GOAL #1   Title Patient will be able to ambulate >1000 feet with quad cane and supervision to improve community ambulation    Time 8    Period Weeks    Status Revised    Target Date 08/19/20      PT LONG TERM GOAL #2   Title Pt will report ability to get on and off transportation and ambulate into clinic with LRAD MOD I (no w/c)    Time 8    Period Weeks    Status Revised    Target Date 08/19/20      PT LONG TERM GOAL #3   Title Patient will be able to go up and down 17 steps with use of one rail with step to sequence with Supervision    Baseline (pt reports he uses one rail and wall to descend; wants to keep a cane at the top and bottom of his stairs)    Time 8    Period Weeks    Status Revised    Target Date 08/19/20      PT LONG TERM GOAL #4   Title Patient will demo improvement on Berg balance scale to  at least 50/56 to improve functional balance and reduce fall risk    Time 8    Period Weeks    Status Revised    Target Date 08/19/20                 Plan - 07/06/20 0910    Clinical Impression Statement Continued to progress patient's aerobic endurance/conditioning and continued to focus on standing balance with decreased UE support when ascending, descending and stepping over objects.  Carried over technique to gait with cane stepping over larger obstacles and up/down 4" curb.  Pt requiring decreased assistance from therapist and demonstrating decreased tremors afer performing.    Personal Factors and Comorbidities Comorbidity 3+;Past/Current Experience;Time since onset of injury/illness/exacerbation;Transportation;Fitness;Age    Comorbidities CHF, ESRD, HTN    Examination-Activity Limitations Bathing;Bed Mobility;Caring for Others;Carry;Dressing;Hygiene/Grooming;Lift;Locomotion Level;Self Feeding;Sit;Squat;Stairs;Stand;Toileting;Transfers    Examination-Participation Restrictions Cleaning;Community Activity;Driving;Interpersonal Relationship;Laundry;Medication Management;Meal Prep;Shop;Yard Work    Stability/Clinical Decision Making Evolving/Moderate complexity    Rehab Potential Good    PT Frequency 2x / week    PT Duration 8 weeks    PT Treatment/Interventions ADLs/Self Care Home Management;Gait training;Stair training;Functional mobility training;Therapeutic activities;Therapeutic exercise;Balance training;Neuromuscular re-education;Patient/family education;Manual techniques;Passive range of motion;DME Instruction    PT Next Visit Plan Still needs to work on endurance, descending stairs, alternating sequence to ascend, balance-  static on compliant surfaces, dynamic on solid and compliant surfaces with decreased UE support    PT [employer]- updated on 04/12/20    Consulted and Agree with Plan of Care Patient           Patient will benefit from skilled  therapeutic intervention in order to improve the following deficits and impairments:  Abnormal gait,Decreased activity tolerance,Decreased balance,Decreased mobility,Decreased endurance,Decreased coordination,Decreased range of motion,Decreased safety awareness,Decreased strength,Difficulty walking,Impaired flexibility,Postural dysfunction,Impaired UE functional use,Impaired tone  Visit Diagnosis: Unsteadiness on feet  Other abnormalities of gait and mobility  Muscle weakness (generalized)     Problem List Patient Active Problem List   Diagnosis Date Noted  . 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 Gait abnormality 08/12/2019  . History of gout 06/17/2019  . Bowel incontinence 06/09/2019  . Anoxic brain injury ([place])   . History of cardiac arrest 04/27/2019  . History of non-ST elevation myocardial infarction (NSTEMI) 04/18/2019  . Morbid obesity ([place]) 04/14/2019  . Diabetic neuropathy ([place]) 04/02/2016  . Hypertension associated with diabetes ([place]) 10/03/2015  . HLD (hyperlipidemia) 09/06/2015  . End stage renal disease on dialysis ([place]) 08/31/2015  . DM (diabetes mellitus), type 2 with renal complications ([place]) 55/73/2202  . OSA treated with BiPAP 12/12/2013  . Coronary atherosclerosis 11/01/2009    Rico Junker, PT, DPT 07/06/20    9:16 AM    [facility] [address] [place], [place], [address] Phone: [phone]   Fax:  [phone]  Name: Troy Wells MRN: [id number] Date of Birth: [birth year]

## 2020-07-07 DIAGNOSIS — N2581 Secondary hyperparathyroidism of renal origin: Secondary | ICD-10-CM | POA: Diagnosis not present

## 2020-07-07 DIAGNOSIS — D689 Coagulation defect, unspecified: Secondary | ICD-10-CM | POA: Diagnosis not present

## 2020-07-07 DIAGNOSIS — D688 Other specified coagulation defects: Secondary | ICD-10-CM | POA: Diagnosis not present

## 2020-07-07 DIAGNOSIS — L299 Pruritus, unspecified: Secondary | ICD-10-CM | POA: Diagnosis not present

## 2020-07-07 DIAGNOSIS — Z992 Dependence on renal dialysis: Secondary | ICD-10-CM | POA: Diagnosis not present

## 2020-07-07 DIAGNOSIS — D631 Anemia in chronic kidney disease: Secondary | ICD-10-CM | POA: Diagnosis not present

## 2020-07-07 DIAGNOSIS — R519 Headache, unspecified: Secondary | ICD-10-CM | POA: Diagnosis not present

## 2020-07-07 DIAGNOSIS — N186 End stage renal disease: Secondary | ICD-10-CM | POA: Diagnosis not present

## 2020-07-07 DIAGNOSIS — R52 Pain, unspecified: Secondary | ICD-10-CM | POA: Diagnosis not present

## 2020-07-07 DIAGNOSIS — E1121 Type 2 diabetes mellitus with diabetic nephropathy: Secondary | ICD-10-CM | POA: Diagnosis not present

## 2020-07-10 DIAGNOSIS — L299 Pruritus, unspecified: Secondary | ICD-10-CM | POA: Diagnosis not present

## 2020-07-10 DIAGNOSIS — R519 Headache, unspecified: Secondary | ICD-10-CM | POA: Diagnosis not present

## 2020-07-10 DIAGNOSIS — Z992 Dependence on renal dialysis: Secondary | ICD-10-CM | POA: Diagnosis not present

## 2020-07-10 DIAGNOSIS — E1121 Type 2 diabetes mellitus with diabetic nephropathy: Secondary | ICD-10-CM | POA: Diagnosis not present

## 2020-07-10 DIAGNOSIS — D631 Anemia in chronic kidney disease: Secondary | ICD-10-CM | POA: Diagnosis not present

## 2020-07-10 DIAGNOSIS — D689 Coagulation defect, unspecified: Secondary | ICD-10-CM | POA: Diagnosis not present

## 2020-07-10 DIAGNOSIS — D688 Other specified coagulation defects: Secondary | ICD-10-CM | POA: Diagnosis not present

## 2020-07-10 DIAGNOSIS — R52 Pain, unspecified: Secondary | ICD-10-CM | POA: Diagnosis not present

## 2020-07-10 DIAGNOSIS — N2581 Secondary hyperparathyroidism of renal origin: Secondary | ICD-10-CM | POA: Diagnosis not present

## 2020-07-10 DIAGNOSIS — N186 End stage renal disease: Secondary | ICD-10-CM | POA: Diagnosis not present

## 2020-07-11 ENCOUNTER — Ambulatory Visit: Payer: Medicare Other | Admitting: Physical Therapy

## 2020-07-12 DIAGNOSIS — N2581 Secondary hyperparathyroidism of renal origin: Secondary | ICD-10-CM | POA: Diagnosis not present

## 2020-07-12 DIAGNOSIS — D631 Anemia in chronic kidney disease: Secondary | ICD-10-CM | POA: Diagnosis not present

## 2020-07-12 DIAGNOSIS — R52 Pain, unspecified: Secondary | ICD-10-CM | POA: Diagnosis not present

## 2020-07-12 DIAGNOSIS — Z992 Dependence on renal dialysis: Secondary | ICD-10-CM | POA: Diagnosis not present

## 2020-07-12 DIAGNOSIS — N186 End stage renal disease: Secondary | ICD-10-CM | POA: Diagnosis not present

## 2020-07-12 DIAGNOSIS — E1121 Type 2 diabetes mellitus with diabetic nephropathy: Secondary | ICD-10-CM | POA: Diagnosis not present

## 2020-07-12 DIAGNOSIS — L299 Pruritus, unspecified: Secondary | ICD-10-CM | POA: Diagnosis not present

## 2020-07-12 DIAGNOSIS — D688 Other specified coagulation defects: Secondary | ICD-10-CM | POA: Diagnosis not present

## 2020-07-12 DIAGNOSIS — R519 Headache, unspecified: Secondary | ICD-10-CM | POA: Diagnosis not present

## 2020-07-12 DIAGNOSIS — D689 Coagulation defect, unspecified: Secondary | ICD-10-CM | POA: Diagnosis not present

## 2020-07-13 ENCOUNTER — Ambulatory Visit: Payer: Medicare Other | Admitting: Physical Therapy

## 2020-07-13 ENCOUNTER — Encounter: Payer: Self-pay | Admitting: Physical Therapy

## 2020-07-13 ENCOUNTER — Other Ambulatory Visit: Payer: Self-pay

## 2020-07-13 DIAGNOSIS — R2689 Other abnormalities of gait and mobility: Secondary | ICD-10-CM

## 2020-07-13 DIAGNOSIS — R2681 Unsteadiness on feet: Secondary | ICD-10-CM | POA: Diagnosis not present

## 2020-07-13 DIAGNOSIS — M6281 Muscle weakness (generalized): Secondary | ICD-10-CM | POA: Diagnosis not present

## 2020-07-13 NOTE — Therapy (Signed)
[facility] [address] [place], [place], [address] Phone: [phone]   Fax:  [phone]  Physical Therapy Treatment  Patient Details  Name: Troy Wells MRN: [id number] Date of Birth: 13-May-1966 Referring Provider (PT): Dr. Andrena Mews   Encounter Date: 07/13/2020   PT End of Session - 07/13/20 0807    Visit Number 73    Number of Visits 26    Date for PT Re-Evaluation 08/19/20    Authorization Type Medicare parts A&B;  10th visit progress note    Progress Note Due on Visit 1    PT Start Time 0804    PT Stop Time 0845    PT Time Calculation (min) 41 min    Activity Tolerance Patient tolerated treatment well    Behavior During Therapy [facility] for tasks assessed/performed           Past Medical History:  Diagnosis Date  . CARDIAC ARREST 11/01/2009   Qualifier: Diagnosis of  By: Selena Batten CMA, Jewel    . Coagulase negative Staphylococcus bacteremia 06/30/2019  . Colon polyps 01/02/2016   Colonoscopy July 2017 - One 3 mm polyp in the transverse colon, removed with a cold biopsy forceps. Resected and retrieved. - One 3 mm polyp in the rectum, removed with a cold biopsy forceps. Resected and retrieved. - Diverticulosis in the entire examined colon. - Non-bleeding internal hemorrhoids. - The examination was otherwise normal. - Significant looping which prolonged cecal    . Coronary artery disease 08/15/2009   with AMI  . Coronary atherosclerosis 11/01/2009   Qualifier: Diagnosis of  By: Selena Batten CMA, Jewel    . Dialysis patient ([place]) 09/27/2014   mon, wed, and fri  . Diverticulosis of colon without hemorrhage 01/02/2016   Colonoscopy July 2017 - One 3 mm polyp in the transverse colon, removed with a cold biopsy forceps. Resected and retrieved. - One 3 mm polyp in the rectum, removed with a cold biopsy forceps. Resected and retrieved. - Diverticulosis in the entire examined colon. - Non-bleeding internal  hemorrhoids. - The examination was otherwise normal. - Significant looping which prolonged cecal    . DM (diabetes mellitus), type 2 with renal complications ([place]) 85/07/7739  . Essential hypertension 11/01/2009   Qualifier: Diagnosis of  By: Selena Batten CMA, Jewel    . Gout 10/21/2018  . History of acute respiratory distress syndrome (ARDS) due to COVID-19 virus   . History of cardiac arrest 04/27/2019  . History of non-ST elevation myocardial infarction (NSTEMI) 04/18/2019  . history of respiratory arrest   . Hyperlipidemia   . Hypertension   . Mitral valve regurgitation 09/19/2015   Echo 09/2015   . 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 OSA treated with BiPAP 06/17/2010  . Protein-calorie malnutrition ([place]) 06/01/2019   Poor PO intake, only eating 50% of meals, could benefit from feeding tube if persistently poor PO intake.  . Pulmonary hypertension ([place]) 04/14/2019   RVSP 38 (TTE 2017) but repeat TTE March 2019 with RVSP 25 RAP in 2020: 70mmhg  . Sleep apnea    cpap nightly    Past Surgical History:  Procedure Laterality Date  . AV FISTULA PLACEMENT Left 09/27/2014  . INSERTION OF ARTERIOVENOUS (AV) ARTEGRAFT ARM Left 04/02/2018   Procedure: INSERTION OF ARTERIOVENOUS (AV) ARTEGRAFT ARM LEFT UPPER ARM;  Surgeon: Waynetta Sandy, MD;  Location: [facility];  Service: Vascular;  Laterality: Left;  . INSERTION OF DIALYSIS CATHETER N/A 04/02/2018   Procedure: INSERTION OF DIALYSIS CATHETER, right internal jugular;  Surgeon: Waynetta Sandy, MD;  Location: [facility];  Service: Vascular;  Laterality: N/A;  . NECK SURGERY     C6 & C7 replaced 30 yrs ago per pt  . REVISON OF ARTERIOVENOUS FISTULA Left 04/02/2018   Procedure: REVISION PLICATION OF ARTERIOVENOUS FISTULA ARM;  Surgeon: Waynetta Sandy, MD;  Location: [facility];  Service: Vascular;  Laterality: Left;  . WISDOM TOOTH EXTRACTION      There were no vitals filed for this visit.   Subjective Assessment - 07/13/20 0806     Subjective No new complaints. No falls or pain. Tremors about the same, "some days are better than others".    Pertinent History CHF, HTN, ERSD    Limitations Standing;Walking;Writing;House hold activities;Lifting    How long can you sit comfortably? supported sitting no problem    How long can you stand comfortably? <5 min    How long can you walk comfortably? 20 feet with walker    Patient Stated Goals improve walking get stronger    Currently in Pain? No/denies    Pain Score 0-No pain                [facility] Adult PT Treatment/Exercise - 07/13/20 0808      Transfers   Transfers Sit to Stand;Stand to Sit;Stand Pivot Transfers    Sit to Stand 6: Modified independent (Device/Increase time)    Stand to Sit 6: Modified independent (Device/Increase time)      Ambulation/Gait   Ambulation/Gait Yes    Ambulation/Gait Assistance 5: Supervision;4: Min assist    Ambulation/Gait Assistance Details cues for posture and cane position with gait. minor tremors at times, however pt able to continue with only 2 pauses on second lap. worked on increased gait speed with both gait reps.    Ambulation Distance (Feet) 230 Feet   x 2, plus around gym with session   Assistive device Straight cane   with rubber quad tip   Gait Pattern Step-through pattern;Decreased stride length;Trunk flexed;Decreased hip/knee flexion - right    Ambulation Surface Level;Indoor    Curb 4: Min assist    Curb Details (indicate cue type and reason) with both aerobic steps: stepping onto/off of both of them next to counter top with cane/hand support on counter for 4 laps. min guard assist for safety with decreased hesitency with negotiation as reps progressed.      High Level Balance   High Level Balance Activities Negotiating over obstacles;Negotitating around obstacles    High Level Balance Comments with cane next to counter: hoola hoop<>3 bolsters of varied heights<>hoola hoop- walking around hoola hoops and stepping over  bolsters for 4 laps with min guard assist for balance      Knee/Hip Exercises: Aerobic   Stepper SCI Fit stepper at level 6.0 x 8 minutes with bilat UE and LE for aerobic conditioning and endurance               PT Short Term Goals - 06/20/20 0915      PT SHORT TERM GOAL #1   Title Pt will ambulate x 800' outside over pavement with quad cane and supervision    Time 4    Period Weeks    Status Revised    Target Date 07/20/20      PT SHORT TERM GOAL #2   Title Patient will be able to ascend and descend 8 stairs with ALTERNATING sequence with one rail and cane with supervision    Time 4  Period Weeks    Status Revised    Target Date 07/20/20      PT SHORT TERM GOAL #3   Title Pt will increase BERG score to >/= 48/56    Time 4    Period Weeks    Status Revised    Target Date 07/20/20      PT SHORT TERM GOAL #4   Title Pt will decrease TUG with cane to </= 13 seconds with supervision    Baseline 15 seconds with cane    Time 4    Period Weeks    Status Revised    Target Date 07/20/20             PT Long Term Goals - 06/20/20 0917      PT LONG TERM GOAL #1   Title Patient will be able to ambulate >1000 feet with quad cane and supervision to improve community ambulation    Time 8    Period Weeks    Status Revised    Target Date 08/19/20      PT LONG TERM GOAL #2   Title Pt will report ability to get on and off transportation and ambulate into clinic with LRAD MOD I (no w/c)    Time 8    Period Weeks    Status Revised    Target Date 08/19/20      PT LONG TERM GOAL #3   Title Patient will be able to go up and down 17 steps with use of one rail with step to sequence with Supervision    Baseline (pt reports he uses one rail and wall to descend; wants to keep a cane at the top and bottom of his stairs)    Time 8    Period Weeks    Status Revised    Target Date 08/19/20      PT LONG TERM GOAL #4   Title Patient will demo improvement on Berg balance scale  to at least 50/56 to improve functional balance and reduce fall risk    Time 8    Period Weeks    Status Revised    Target Date 08/19/20                 Plan - 07/13/20 0807    Clinical Impression Statement Today's skilled session continued to focus on strengthening, activity tolerance and use of cane with mobility. Few rest breaks needed due to LE fatigue, no other issues noted or reported in session. The pt is progressing toward goals and should benefit from continued PT to progress toward unmet goals.    Personal Factors and Comorbidities Comorbidity 3+;Past/Current Experience;Time since onset of injury/illness/exacerbation;Transportation;Fitness;Age    Comorbidities CHF, ESRD, HTN    Examination-Activity Limitations Bathing;Bed Mobility;Caring for Others;Carry;Dressing;Hygiene/Grooming;Lift;Locomotion Level;Self Feeding;Sit;Squat;Stairs;Stand;Toileting;Transfers    Examination-Participation Restrictions Cleaning;Community Activity;Driving;Interpersonal Relationship;Laundry;Medication Management;Meal Prep;Shop;Yard Work    Stability/Clinical Decision Making Evolving/Moderate complexity    Rehab Potential Good    PT Frequency 2x / week    PT Duration 8 weeks    PT Treatment/Interventions ADLs/Self Care Home Management;Gait training;Stair training;Functional mobility training;Therapeutic activities;Therapeutic exercise;Balance training;Neuromuscular re-education;Patient/family education;Manual techniques;Passive range of motion;DME Instruction    PT Next Visit Plan Still needs to work on endurance, descending stairs, alternating sequence to ascend, balance- static on compliant surfaces, dynamic on solid and compliant surfaces with decreased UE support    PT [place]- updated on 04/12/20    Consulted and Agree with Plan of Care Patient  Patient will benefit from skilled therapeutic intervention in order to improve the following deficits and  impairments:  Abnormal gait,Decreased activity tolerance,Decreased balance,Decreased mobility,Decreased endurance,Decreased coordination,Decreased range of motion,Decreased safety awareness,Decreased strength,Difficulty walking,Impaired flexibility,Postural dysfunction,Impaired UE functional use,Impaired tone  Visit Diagnosis: Unsteadiness on feet  Other abnormalities of gait and mobility  Muscle weakness (generalized)     Problem List Patient Active Problem List   Diagnosis Date Noted  . 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 Gait abnormality 08/12/2019  . History of gout 06/17/2019  . Bowel incontinence 06/09/2019  . Anoxic brain injury ([place])   . History of cardiac arrest 04/27/2019  . History of non-ST elevation myocardial infarction (NSTEMI) 04/18/2019  . Morbid obesity ([place]) 04/14/2019  . Diabetic neuropathy ([place]) 04/02/2016  . Hypertension associated with diabetes ([place]) 10/03/2015  . HLD (hyperlipidemia) 09/06/2015  . End stage renal disease on dialysis ([place]) 08/31/2015  . DM (diabetes mellitus), type 2 with renal complications ([place]) 40/03/2724  . OSA treated with BiPAP 12/12/2013  . Coronary atherosclerosis 11/01/2009    Willow Ora, PTA, [facility] [address], [place], [place] [address] [phone] 07/13/20, 9:29 AM   Name: Troy Wells MRN: [id number] Date of Birth: 1965-10-09

## 2020-07-14 DIAGNOSIS — Z992 Dependence on renal dialysis: Secondary | ICD-10-CM | POA: Diagnosis not present

## 2020-07-14 DIAGNOSIS — D689 Coagulation defect, unspecified: Secondary | ICD-10-CM | POA: Diagnosis not present

## 2020-07-14 DIAGNOSIS — D631 Anemia in chronic kidney disease: Secondary | ICD-10-CM | POA: Diagnosis not present

## 2020-07-14 DIAGNOSIS — R52 Pain, unspecified: Secondary | ICD-10-CM | POA: Diagnosis not present

## 2020-07-14 DIAGNOSIS — N2581 Secondary hyperparathyroidism of renal origin: Secondary | ICD-10-CM | POA: Diagnosis not present

## 2020-07-14 DIAGNOSIS — N186 End stage renal disease: Secondary | ICD-10-CM | POA: Diagnosis not present

## 2020-07-14 DIAGNOSIS — D688 Other specified coagulation defects: Secondary | ICD-10-CM | POA: Diagnosis not present

## 2020-07-14 DIAGNOSIS — E1121 Type 2 diabetes mellitus with diabetic nephropathy: Secondary | ICD-10-CM | POA: Diagnosis not present

## 2020-07-14 DIAGNOSIS — L299 Pruritus, unspecified: Secondary | ICD-10-CM | POA: Diagnosis not present

## 2020-07-14 DIAGNOSIS — R519 Headache, unspecified: Secondary | ICD-10-CM | POA: Diagnosis not present

## 2020-07-17 DIAGNOSIS — D688 Other specified coagulation defects: Secondary | ICD-10-CM | POA: Diagnosis not present

## 2020-07-17 DIAGNOSIS — E1121 Type 2 diabetes mellitus with diabetic nephropathy: Secondary | ICD-10-CM | POA: Diagnosis not present

## 2020-07-17 DIAGNOSIS — D631 Anemia in chronic kidney disease: Secondary | ICD-10-CM | POA: Diagnosis not present

## 2020-07-17 DIAGNOSIS — D689 Coagulation defect, unspecified: Secondary | ICD-10-CM | POA: Diagnosis not present

## 2020-07-17 DIAGNOSIS — R519 Headache, unspecified: Secondary | ICD-10-CM | POA: Diagnosis not present

## 2020-07-17 DIAGNOSIS — R52 Pain, unspecified: Secondary | ICD-10-CM | POA: Diagnosis not present

## 2020-07-17 DIAGNOSIS — Z992 Dependence on renal dialysis: Secondary | ICD-10-CM | POA: Diagnosis not present

## 2020-07-17 DIAGNOSIS — N186 End stage renal disease: Secondary | ICD-10-CM | POA: Diagnosis not present

## 2020-07-17 DIAGNOSIS — E1122 Type 2 diabetes mellitus with diabetic chronic kidney disease: Secondary | ICD-10-CM | POA: Diagnosis not present

## 2020-07-17 DIAGNOSIS — N2581 Secondary hyperparathyroidism of renal origin: Secondary | ICD-10-CM | POA: Diagnosis not present

## 2020-07-17 DIAGNOSIS — L299 Pruritus, unspecified: Secondary | ICD-10-CM | POA: Diagnosis not present

## 2020-07-18 ENCOUNTER — Ambulatory Visit: Payer: Medicare Other | Attending: Family Medicine | Admitting: Physical Therapy

## 2020-07-18 ENCOUNTER — Encounter: Payer: Self-pay | Admitting: Physical Therapy

## 2020-07-18 ENCOUNTER — Other Ambulatory Visit: Payer: Self-pay

## 2020-07-18 DIAGNOSIS — R2689 Other abnormalities of gait and mobility: Secondary | ICD-10-CM | POA: Diagnosis not present

## 2020-07-18 DIAGNOSIS — R2681 Unsteadiness on feet: Secondary | ICD-10-CM | POA: Diagnosis not present

## 2020-07-18 DIAGNOSIS — M6281 Muscle weakness (generalized): Secondary | ICD-10-CM | POA: Insufficient documentation

## 2020-07-18 NOTE — Therapy (Signed)
[facility] [address] [place], [place], [address] Phone: [phone]   Fax:  [phone]  Physical Therapy Treatment  Patient Details  Name: Troy Wells MRN: [id number] Date of Birth: July 16, 1965 Referring Provider (PT): Dr. Andrena Mews   Encounter Date: 07/18/2020   PT End of Session - 07/18/20 0811    Visit Number 74    Number of Visits 68    Date for PT Re-Evaluation 08/19/20    Authorization Type Medicare parts A&B;  10th visit progress note    Progress Note Due on Visit 31    PT Start Time 0801    PT Stop Time 0845    PT Time Calculation (min) 44 min    Activity Tolerance Patient tolerated treatment well    Behavior During Therapy [facility] for tasks assessed/performed           Past Medical History:  Diagnosis Date  . CARDIAC ARREST 11/01/2009   Qualifier: Diagnosis of  By: Selena Batten CMA, Jewel    . Coagulase negative Staphylococcus bacteremia 06/30/2019  . Colon polyps 01/02/2016   Colonoscopy July 2017 - One 3 mm polyp in the transverse colon, removed with a cold biopsy forceps. Resected and retrieved. - One 3 mm polyp in the rectum, removed with a cold biopsy forceps. Resected and retrieved. - Diverticulosis in the entire examined colon. - Non-bleeding internal hemorrhoids. - The examination was otherwise normal. - Significant looping which prolonged cecal    . Coronary artery disease 08/15/2009   with AMI  . Coronary atherosclerosis 11/01/2009   Qualifier: Diagnosis of  By: Selena Batten CMA, Jewel    . Dialysis patient ([place]) 09/27/2014   mon, wed, and fri  . Diverticulosis of colon without hemorrhage 01/02/2016   Colonoscopy July 2017 - One 3 mm polyp in the transverse colon, removed with a cold biopsy forceps. Resected and retrieved. - One 3 mm polyp in the rectum, removed with a cold biopsy forceps. Resected and retrieved. - Diverticulosis in the entire examined colon. - Non-bleeding internal  hemorrhoids. - The examination was otherwise normal. - Significant looping which prolonged cecal    . DM (diabetes mellitus), type 2 with renal complications ([place]) 09/32/3557  . Essential hypertension 11/01/2009   Qualifier: Diagnosis of  By: Selena Batten CMA, Jewel    . Gout 10/21/2018  . History of acute respiratory distress syndrome (ARDS) due to COVID-19 virus   . History of cardiac arrest 04/27/2019  . History of non-ST elevation myocardial infarction (NSTEMI) 04/18/2019  . history of respiratory arrest   . Hyperlipidemia   . Hypertension   . Mitral valve regurgitation 09/19/2015   Echo 09/2015   . 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 OSA treated with BiPAP 06/17/2010  . Protein-calorie malnutrition ([place]) 06/01/2019   Poor PO intake, only eating 50% of meals, could benefit from feeding tube if persistently poor PO intake.  . Pulmonary hypertension ([place]) 04/14/2019   RVSP 38 (TTE 2017) but repeat TTE March 2019 with RVSP 25 RAP in 2020: 50mhg  . Sleep apnea    cpap nightly    Past Surgical History:  Procedure Laterality Date  . AV FISTULA PLACEMENT Left 09/27/2014  . INSERTION OF ARTERIOVENOUS (AV) ARTEGRAFT ARM Left 04/02/2018   Procedure: INSERTION OF ARTERIOVENOUS (AV) ARTEGRAFT ARM LEFT UPPER ARM;  Surgeon: CWaynetta Sandy MD;  Location: [facility]  Service: Vascular;  Laterality: Left;  . INSERTION OF DIALYSIS CATHETER N/A 04/02/2018   Procedure: INSERTION OF DIALYSIS CATHETER, right internal jugular;  Surgeon: Waynetta Sandy, MD;  Location: [facility];  Service: Vascular;  Laterality: N/A;  . NECK SURGERY     C6 & C7 replaced 30 yrs ago per pt  . REVISON OF ARTERIOVENOUS FISTULA Left 04/02/2018   Procedure: REVISION PLICATION OF ARTERIOVENOUS FISTULA ARM;  Surgeon: Waynetta Sandy, MD;  Location: [facility];  Service: Vascular;  Laterality: Left;  . WISDOM TOOTH EXTRACTION      There were no vitals filed for this visit.   Subjective Assessment - 07/18/20 0810     Subjective No new complaitns. No falls or pain to report. Has been using the cane "some" at daughters home, mostly RW at this time.    Pertinent History CHF, HTN, ERSD    Limitations Standing;Walking;Writing;House hold activities;Lifting    How long can you sit comfortably? supported sitting no problem    How long can you stand comfortably? <5 min    How long can you walk comfortably? 20 feet with walker    Patient Stated Goals improve walking get stronger    Currently in Pain? No/denies    Pain Score 0-No pain              [facility] PT Assessment - 07/18/20 0812      Timed Up and Go Test   TUG Normal TUG    Normal TUG (seconds) 14   with cane              [facility] Adult PT Treatment/Exercise - 07/18/20 0812      Transfers   Transfers Sit to Stand;Stand to Sit;Stand Pivot Transfers    Sit to Stand 6: Modified independent (Device/Increase time)    Stand to Sit 6: Modified independent (Device/Increase time)      Ambulation/Gait   Ambulation/Gait Yes    Ambulation/Gait Assistance 5: Supervision    Ambulation/Gait Assistance Details needing to sit after ~600 feet due to right knee pain/discomfort. Pt feels the cold weather is making it hurt more today.    Ambulation Distance (Feet) 600 Feet   x1, plus around gym with session   Assistive device Straight cane   with rubber quad tip   Gait Pattern Step-through pattern;Decreased stride length;Trunk flexed;Decreased hip/knee flexion - right    Ambulation Surface Level;Indoor    Stairs Yes    Stairs Assistance 5: Supervision    Stair Management Technique One rail Right;Step to pattern;Forwards    Number of Stairs 4    Height of Stairs 6      Knee/Hip Exercises: Aerobic   Stepper SCI Fit stepper at level 6.0 x 8 minutes with bilat UE and LE for aerobic conditioning and endurance               PT Short Term Goals - 07/18/20 0812      PT SHORT TERM GOAL #1   Title Pt will ambulate x 800' outside over pavement with quad cane  and supervision    Baseline 07/18/20: 600 feet indoors with cane at supervision level ("too cold" to go outside per pt today). improved indoor distance.    Time --    Period --    Status Partially Met    Target Date --      PT SHORT TERM GOAL #2   Title Patient will be able to ascend and descend 8 stairs with ALTERNATING sequence with one rail and cane with supervision    Baseline 07/18/20: with cane/rail pt supervision for step to pattern. progressed from 2 rails  and min guard assist, just not to goal.    Time --    Period --    Status Partially Met    Target Date --      PT SHORT TERM GOAL #3   Title Pt will increase BERG score to >/= 48/56    Time 4    Period Weeks    Status On-going    Target Date 07/20/20      PT SHORT TERM GOAL #4   Title Pt will decrease TUG with cane to </= 13 seconds with supervision    Baseline 07/18/20: 14.0 sec's with cane, supervision. Improved, just not to goal level    Time --    Period --    Status Partially Met    Target Date --             PT Long Term Goals - 06/20/20 0917      PT LONG TERM GOAL #1   Title Patient will be able to ambulate >1000 feet with quad cane and supervision to improve community ambulation    Time 8    Period Weeks    Status Revised    Target Date 08/19/20      PT LONG TERM GOAL #2   Title Pt will report ability to get on and off transportation and ambulate into clinic with LRAD MOD I (no w/c)    Time 8    Period Weeks    Status Revised    Target Date 08/19/20      PT LONG TERM GOAL #3   Title Patient will be able to go up and down 17 steps with use of one rail with step to sequence with Supervision    Baseline (pt reports he uses one rail and wall to descend; wants to keep a cane at the top and bottom of his stairs)    Time 8    Period Weeks    Status Revised    Target Date 08/19/20      PT LONG TERM GOAL #4   Title Patient will demo improvement on Berg balance scale to at least 50/56 to improve  functional balance and reduce fall risk    Time 8    Period Weeks    Status Revised    Target Date 08/19/20                 Plan - 07/18/20 0929    Clinical Impression Statement Today's skilled session continued to focus on strengthening and gait/barriers with cane while looking at progress toward STGs. Pt with increased indoor gait distance with use of cane before needing to sit due to knee pain. Also required less assistance with stairs while using single rail/cane on other side. Did not assess outdoor gait due to pt stating "too cold" and already having increased knee pain from cold while waiting on transportaion . Pt also continues to be limited in reciprocal pattern on stairs due to tremors, pain and trusting LE's not to buckle. Pt aslo decreased the time for the TIimed up and go test this session. The pt is progressing toward goals and should beneift from continued PT to progress toward unmet goals.    Personal Factors and Comorbidities Comorbidity 3+;Past/Current Experience;Time since onset of injury/illness/exacerbation;Transportation;Fitness;Age    Comorbidities CHF, ESRD, HTN    Examination-Activity Limitations Bathing;Bed Mobility;Caring for Others;Carry;Dressing;Hygiene/Grooming;Lift;Locomotion Level;Self Feeding;Sit;Squat;Stairs;Stand;Toileting;Transfers    Examination-Participation Restrictions Cleaning;Community Activity;Driving;Interpersonal Relationship;Laundry;Medication Management;Meal Prep;Shop;Yard Work    [employer] Evolving/Moderate complexity  Rehab Potential Good    PT Frequency 2x / week    PT Duration 8 weeks    PT Treatment/Interventions ADLs/Self Care Home Management;Gait training;Stair training;Functional mobility training;Therapeutic activities;Therapeutic exercise;Balance training;Neuromuscular re-education;Patient/family education;Manual techniques;Passive range of motion;DME Instruction    PT Next Visit Plan Check remaining STGs.  Still needs to work on endurance, descending stairs, alternating sequence to ascend, balance- static on compliant surfaces, dynamic on solid and compliant surfaces with decreased UE support    PT [place]- updated on 04/12/20    Consulted and Agree with Plan of Care Patient           Patient will benefit from skilled therapeutic intervention in order to improve the following deficits and impairments:  Abnormal gait,Decreased activity tolerance,Decreased balance,Decreased mobility,Decreased endurance,Decreased coordination,Decreased range of motion,Decreased safety awareness,Decreased strength,Difficulty walking,Impaired flexibility,Postural dysfunction,Impaired UE functional use,Impaired tone  Visit Diagnosis: Unsteadiness on feet  Other abnormalities of gait and mobility  Muscle weakness (generalized)     Problem List Patient Active Problem List   Diagnosis Date Noted  . 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 Gait abnormality 08/12/2019  . History of gout 06/17/2019  . Bowel incontinence 06/09/2019  . Anoxic brain injury ([place])   . History of cardiac arrest 04/27/2019  . History of non-ST elevation myocardial infarction (NSTEMI) 04/18/2019  . Morbid obesity ([place]) 04/14/2019  . Diabetic neuropathy ([place]) 04/02/2016  . Hypertension associated with diabetes ([place]) 10/03/2015  . HLD (hyperlipidemia) 09/06/2015  . End stage renal disease on dialysis ([place]) 08/31/2015  . DM (diabetes mellitus), type 2 with renal complications ([place]) 86/76/1950  . OSA treated with BiPAP 12/12/2013  . Coronary atherosclerosis 11/01/2009    Willow Ora, PTA, [facility] [address] [place], [place] [address] [phone] 07/18/20, 12:31 PM   Name: Troy Wells MRN: [id number] Date of Birth: 1965-08-15

## 2020-07-19 DIAGNOSIS — D689 Coagulation defect, unspecified: Secondary | ICD-10-CM | POA: Diagnosis not present

## 2020-07-19 DIAGNOSIS — N186 End stage renal disease: Secondary | ICD-10-CM | POA: Diagnosis not present

## 2020-07-19 DIAGNOSIS — D688 Other specified coagulation defects: Secondary | ICD-10-CM | POA: Diagnosis not present

## 2020-07-19 DIAGNOSIS — Z992 Dependence on renal dialysis: Secondary | ICD-10-CM | POA: Diagnosis not present

## 2020-07-19 DIAGNOSIS — N2581 Secondary hyperparathyroidism of renal origin: Secondary | ICD-10-CM | POA: Diagnosis not present

## 2020-07-19 DIAGNOSIS — R52 Pain, unspecified: Secondary | ICD-10-CM | POA: Diagnosis not present

## 2020-07-20 ENCOUNTER — Other Ambulatory Visit: Payer: Self-pay

## 2020-07-20 ENCOUNTER — Encounter: Payer: Self-pay | Admitting: Physical Therapy

## 2020-07-20 ENCOUNTER — Ambulatory Visit: Payer: Medicare Other | Admitting: Physical Therapy

## 2020-07-20 DIAGNOSIS — M6281 Muscle weakness (generalized): Secondary | ICD-10-CM

## 2020-07-20 DIAGNOSIS — R2689 Other abnormalities of gait and mobility: Secondary | ICD-10-CM | POA: Diagnosis not present

## 2020-07-20 DIAGNOSIS — R2681 Unsteadiness on feet: Secondary | ICD-10-CM | POA: Diagnosis not present

## 2020-07-20 NOTE — Therapy (Signed)
[facility] [address] [place], [place], [address] Phone: [phone]   Fax:  [phone]  Physical Therapy Treatment  Patient Details  Name: Troy Wells MRN: [id number] Date of Birth: 01-27-1966 Referring Provider (PT): Dr. Andrena Mews   Encounter Date: 07/20/2020   PT End of Session - 07/20/20 1113    Visit Number 69    Number of Visits 53    Date for PT Re-Evaluation 08/19/20    Authorization Type Medicare parts A&B;  10th visit progress note    Progress Note Due on Visit 60    PT Start Time 0803    PT Stop Time 0844    PT Time Calculation (min) 41 min    Equipment Utilized During Treatment Gait belt    Activity Tolerance Patient tolerated treatment well    Behavior During Therapy [facility] for tasks assessed/performed           Past Medical History:  Diagnosis Date  . CARDIAC ARREST 11/01/2009   Qualifier: Diagnosis of  By: Selena Batten CMA, [place]    . Coagulase negative Staphylococcus bacteremia 06/30/2019  . Colon polyps 01/02/2016   Colonoscopy July 2017 - One 3 mm polyp in the transverse colon, removed with a cold biopsy forceps. Resected and retrieved. - One 3 mm polyp in the rectum, removed with a cold biopsy forceps. Resected and retrieved. - Diverticulosis in the entire examined colon. - Non-bleeding internal hemorrhoids. - The examination was otherwise normal. - Significant looping which prolonged cecal    . Coronary artery disease 08/15/2009   with AMI  . Coronary atherosclerosis 11/01/2009   Qualifier: Diagnosis of  By: Selena Batten CMA, Jewel    . Dialysis patient ([place]) 09/27/2014   mon, wed, and fri  . Diverticulosis of colon without hemorrhage 01/02/2016   Colonoscopy July 2017 - One 3 mm polyp in the transverse colon, removed with a cold biopsy forceps. Resected and retrieved. - One 3 mm polyp in the rectum, removed with a cold biopsy forceps. Resected and retrieved. - Diverticulosis in the  entire examined colon. - Non-bleeding internal hemorrhoids. - The examination was otherwise normal. - Significant looping which prolonged cecal    . DM (diabetes mellitus), type 2 with renal complications ([place]) 69/62/9528  . Essential hypertension 11/01/2009   Qualifier: Diagnosis of  By: Selena Batten CMA, Jewel    . Gout 10/21/2018  . History of acute respiratory distress syndrome (ARDS) due to COVID-19 virus   . History of cardiac arrest 04/27/2019  . History of non-ST elevation myocardial infarction (NSTEMI) 04/18/2019  . history of respiratory arrest   . Hyperlipidemia   . Hypertension   . Mitral valve regurgitation 09/19/2015   Echo 09/2015   . 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 OSA treated with BiPAP 06/17/2010  . Protein-calorie malnutrition ([place]) 06/01/2019   Poor PO intake, only eating 50% of meals, could benefit from feeding tube if persistently poor PO intake.  . Pulmonary hypertension ([place]) 04/14/2019   RVSP 38 (TTE 2017) but repeat TTE March 2019 with RVSP 25 RAP in 2020: 1mhg  . Sleep apnea    cpap nightly    Past Surgical History:  Procedure Laterality Date  . AV FISTULA PLACEMENT Left 09/27/2014  . INSERTION OF ARTERIOVENOUS (AV) ARTEGRAFT ARM Left 04/02/2018   Procedure: INSERTION OF ARTERIOVENOUS (AV) ARTEGRAFT ARM LEFT UPPER ARM;  Surgeon: CWaynetta Sandy MD;  Location[facility]  Service: Vascular;  Laterality: Left;  . INSERTION OF DIALYSIS CATHETER N/A 04/02/2018  Procedure: INSERTION OF DIALYSIS CATHETER, right internal jugular;  Surgeon: Waynetta Sandy, MD;  Location: [facility];  Service: Vascular;  Laterality: N/A;  . NECK SURGERY     C6 & C7 replaced 30 yrs ago per pt  . REVISON OF ARTERIOVENOUS FISTULA Left 04/02/2018   Procedure: REVISION PLICATION OF ARTERIOVENOUS FISTULA ARM;  Surgeon: Waynetta Sandy, MD;  Location: [facility];  Service: Vascular;  Laterality: Left;  . WISDOM TOOTH EXTRACTION      There were no vitals filed for this  visit.   Subjective Assessment - 07/20/20 0804    Subjective No falls, nothing new. Has been using the cane at home and it is going well.    Pertinent History CHF, HTN, ERSD    Limitations Standing;Walking;Writing;House hold activities;Lifting    How long can you sit comfortably? supported sitting no problem    How long can you stand comfortably? <5 min    How long can you walk comfortably? 20 feet with walker    Patient Stated Goals improve walking get stronger    Currently in Pain? No/denies              [facility] PT Assessment - 07/20/20 0808      Berg Balance Test   Sit to Stand Able to stand without using hands and stabilize independently    Standing Unsupported Able to stand safely 2 minutes    Sitting with Back Unsupported but Feet Supported on Floor or Stool Able to sit safely and securely 2 minutes    Stand to Sit Sits safely with minimal use of hands    Transfers Able to transfer safely, minor use of hands    Standing Unsupported with Eyes Closed Able to stand 10 seconds safely    Standing Unsupported with Feet Together Able to place feet together independently and stand 1 minute safely    From Standing, Reach Forward with Outstretched Arm Can reach confidently >25 cm (10")    From Standing Position, Pick up Object from Floor Able to pick up shoe safely and easily    From Standing Position, Turn to Look Behind Over each Shoulder Looks behind from both sides and weight shifts well    Turn 360 Degrees Able to turn 360 degrees safely but slowly    Standing Unsupported, Alternately Place Feet on Step/Stool Needs assistance to keep from falling or unable to try    Standing Unsupported, One Foot in Front Able to plae foot ahead of the other independently and hold 30 seconds    Standing on One Leg Tries to lift leg/unable to hold 3 seconds but remains standing independently    Total Score 46    Berg comment: 46/56 = moderate risk for falls                    Access  Code: [email or web address] URL: [email or web address] Date: 07/20/2020 Prepared by: Janann August  Reviewed bolded exercises below - as pt had not been performing with green resistance band as directed (pt misplaced it) and pt currently performing with no band/red band   Exercises Side Stepping with Counter Support - 1 x daily - 7 x weekly - 2 sets Standing Hip Abduction with Resistance at Ankles and Counter Support - 1 x daily - 5 x weekly - 1 sets - 10 reps - with green tband, performed 2 x 5 reps B each side, with verbal and tactile cues for proper technique, cues to lead with outside of heels  Standing Hip Extension with Resistance at Ankles and Counter Support - 1 x daily - 5 x weekly - 1 sets - 10 reps - with green tband, verbal and demo cues for proper technique and upright posture when performing Standing Hip Flexion with Resistance Loop - 1 x daily - 7 x weekly - 2 sets - 10 reps - pt currently performing facing countertop with incr forward flexed posture to kick legs forwards, discussed with pt standing at countertop facing sideways and having daughter bring a chair to other side for pt to have BUE support and help with tall posture and incr ROM when performing hip flexion forwards, performed 2 x 5 reps B.  Standing Single Leg Stance with Counter Support - 1 x daily - 5 x weekly - 1 sets - 3 reps - 10 hold Tandem Walking with Counter Support - 1 x daily - 5 x weekly - 1 sets - 3 reps Standing Foot Tap on Box (BKA) - 1 x daily - 5 x weekly - 1 sets - 10 reps      [facility] Adult PT Treatment/Exercise - 07/20/20 0001      Ambulation/Gait   Ambulation/Gait Yes    Ambulation/Gait Assistance 5: Supervision    Ambulation/Gait Assistance Details clinic distances between activities    Assistive device Straight cane   with quad tip   Gait Pattern Step-through pattern;Decreased stride length;Trunk flexed;Decreased hip/knee flexion - right    Ambulation Surface Level;Indoor      Knee/Hip  Exercises: Aerobic   Stepper SCI Fit stepper at level 6.0 x 7 minutes with bilat UE and LE for aerobic conditioning and endurance                  PT Education - 07/20/20 0837    Education Details progress towards goals, reviewed strengthening exercises for HEP with green tband    Person(s) Educated Patient    Methods Explanation;Demonstration;Verbal cues    Comprehension Returned demonstration;Verbalized understanding            PT Short Term Goals - 07/20/20 0815      PT SHORT TERM GOAL #1   Title Pt will ambulate x 800' outside over pavement with quad cane and supervision    Baseline 07/18/20: 600 feet indoors with cane at supervision level ("too cold" to go outside per pt today). improved indoor distance.    Status Partially Met      PT SHORT TERM GOAL #2   Title Patient will be able to ascend and descend 8 stairs with ALTERNATING sequence with one rail and cane with supervision    Baseline 07/18/20: with cane/rail pt supervision for step to pattern. progressed from 2 rails and min guard assist, just not to goal.    Status Partially Met      PT SHORT TERM GOAL #3   Title Pt will increase BERG score to >/= 48/56    Baseline 46/56 on 07/20/20    Time 4    Period Weeks    Status Not Met    Target Date 07/20/20      PT SHORT TERM GOAL #4   Title Pt will decrease TUG with cane to </= 13 seconds with supervision    Baseline 07/18/20: 14.0 sec's with cane, supervision. Improved, just not to goal level    Status Partially Met             PT Long Term Goals - 06/20/20 0917      PT LONG  TERM GOAL #1   Title Patient will be able to ambulate >1000 feet with quad cane and supervision to improve community ambulation    Time 8    Period Weeks    Status Revised    Target Date 08/19/20      PT LONG TERM GOAL #2   Title Pt will report ability to get on and off transportation and ambulate into clinic with LRAD MOD I (no w/c)    Time 8    Period Weeks    Status Revised     Target Date 08/19/20      PT LONG TERM GOAL #3   Title Patient will be able to go up and down 17 steps with use of one rail with step to sequence with Supervision    Baseline (pt reports he uses one rail and wall to descend; wants to keep a cane at the top and bottom of his stairs)    Time 8    Period Weeks    Status Revised    Target Date 08/19/20      PT LONG TERM GOAL #4   Title Patient will demo improvement on Berg balance scale to at least 50/56 to improve functional balance and reduce fall risk    Time 8    Period Weeks    Status Revised    Target Date 08/19/20                 Plan - 07/20/20 1118    Clinical Impression Statement Checked pt's last remaining STG today - pt did not meet STG #3 in regards to BERG, pt scored a 46/56 today (previously 47/56), indicating pt is still at a moderate risk for falls. Remainder of session focused on BLE strengthening as well as reviewing technique on standing strengthening exercises at countertop with green tband resistance. Pt tolerated session well - needed intermittent rest breaks. Will continue to progress towards LTGs.    Personal Factors and Comorbidities Comorbidity 3+;Past/Current Experience;Time since onset of injury/illness/exacerbation;Transportation;Fitness;Age    Comorbidities CHF, ESRD, HTN    Examination-Activity Limitations Bathing;Bed Mobility;Caring for Others;Carry;Dressing;Hygiene/Grooming;Lift;Locomotion Level;Self Feeding;Sit;Squat;Stairs;Stand;Toileting;Transfers    Examination-Participation Restrictions Cleaning;Community Activity;Driving;Interpersonal Relationship;Laundry;Medication Management;Meal Prep;Shop;Yard Work    Stability/Clinical Decision Making Evolving/Moderate complexity    Rehab Potential Good    PT Frequency 2x / week    PT Duration 8 weeks    PT Treatment/Interventions ADLs/Self Care Home Management;Gait training;Stair training;Functional mobility training;Therapeutic activities;Therapeutic  exercise;Balance training;Neuromuscular re-education;Patient/family education;Manual techniques;Passive range of motion;DME Instruction    PT Next Visit Plan . Still needs to work on endurance, descending stairs, alternating sequence to ascend, balance- static on compliant surfaces, dynamic on solid and compliant surfaces with decreased UE support    PT [place]- updated on 04/12/20    Consulted and Agree with Plan of Care Patient           Patient will benefit from skilled therapeutic intervention in order to improve the following deficits and impairments:  Abnormal gait,Decreased activity tolerance,Decreased balance,Decreased mobility,Decreased endurance,Decreased coordination,Decreased range of motion,Decreased safety awareness,Decreased strength,Difficulty walking,Impaired flexibility,Postural dysfunction,Impaired UE functional use,Impaired tone  Visit Diagnosis: Unsteadiness on feet  Other abnormalities of gait and mobility  Muscle weakness (generalized)     Problem List Patient Active Problem List   Diagnosis Date Noted  . 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 Gait abnormality 08/12/2019  . History of gout 06/17/2019  . Bowel incontinence 06/09/2019  . Anoxic brain injury ([place])   . History of cardiac  arrest 04/27/2019  . History of non-ST elevation myocardial infarction (NSTEMI) 04/18/2019  . Morbid obesity ([place]) 04/14/2019  . Diabetic neuropathy ([place]) 04/02/2016  . Hypertension associated with diabetes ([place]) 10/03/2015  . HLD (hyperlipidemia) 09/06/2015  . End stage renal disease on dialysis ([place]) 08/31/2015  . DM (diabetes mellitus), type 2 with renal complications ([place]) 15/17/6160  . OSA treated with BiPAP 12/12/2013  . Coronary atherosclerosis 11/01/2009    Arliss Journey, PT, DPT  07/20/2020, 11:19 AM  [facility] [address] [place], [place], [address] Phone: [phone]    Fax:  [phone]  Name: Troy Wells MRN: [id number] Date of Birth: May 29, 1966

## 2020-07-21 DIAGNOSIS — N186 End stage renal disease: Secondary | ICD-10-CM | POA: Diagnosis not present

## 2020-07-21 DIAGNOSIS — D689 Coagulation defect, unspecified: Secondary | ICD-10-CM | POA: Diagnosis not present

## 2020-07-21 DIAGNOSIS — R52 Pain, unspecified: Secondary | ICD-10-CM | POA: Diagnosis not present

## 2020-07-21 DIAGNOSIS — N2581 Secondary hyperparathyroidism of renal origin: Secondary | ICD-10-CM | POA: Diagnosis not present

## 2020-07-21 DIAGNOSIS — Z992 Dependence on renal dialysis: Secondary | ICD-10-CM | POA: Diagnosis not present

## 2020-07-21 DIAGNOSIS — D688 Other specified coagulation defects: Secondary | ICD-10-CM | POA: Diagnosis not present

## 2020-07-25 ENCOUNTER — Ambulatory Visit: Payer: Medicare Other | Admitting: Physical Therapy

## 2020-07-27 ENCOUNTER — Ambulatory Visit: Payer: Medicare Other | Admitting: Physical Therapy

## 2020-07-27 ENCOUNTER — Telehealth: Payer: Self-pay | Admitting: Family Medicine

## 2020-07-27 NOTE — Telephone Encounter (Signed)
I received a death certification completion request from [facility] vital record.   The first name listed does not match this patient.  I called his daughter and he confirmed that he died 08-09-2020 at home prior to going to dialysis in the morning.  EMS arrived but was unable to resuscitate him.  Cause of death unknown. Likely cardiac arrest  I contacted [facility] help desk to update the first name. However, they had to cancel the initial request and recommended that I start the submission all over.

## 2020-08-01 ENCOUNTER — Ambulatory Visit: Payer: Medicare Other | Admitting: Physical Therapy

## 2020-08-03 ENCOUNTER — Ambulatory Visit: Payer: Medicare Other | Admitting: Physical Therapy

## 2020-08-08 ENCOUNTER — Ambulatory Visit: Payer: Medicare Other | Admitting: Physical Therapy

## 2020-08-10 ENCOUNTER — Ambulatory Visit: Payer: Medicare Other | Admitting: Physical Therapy

## 2020-08-15 ENCOUNTER — Ambulatory Visit: Payer: Medicare Other | Admitting: Physical Therapy

## 2020-08-15 DEATH — deceased

## 2020-08-17 ENCOUNTER — Ambulatory Visit: Payer: Medicare Other | Admitting: Physical Therapy

## 2020-10-04 ENCOUNTER — Ambulatory Visit: Payer: Medicare Other | Admitting: Podiatry
